# Patient Record
Sex: Male | Born: 1955 | Hispanic: No | Marital: Married | Smoking: Never smoker
Health system: Southern US, Community
[De-identification: ages and names within clinical notes are randomized; demographics above are authoritative.]

## PROBLEM LIST (undated history)

## (undated) DIAGNOSIS — R0683 Snoring: Secondary | ICD-10-CM

## (undated) DIAGNOSIS — S92909A Unspecified fracture of unspecified foot, initial encounter for closed fracture: Secondary | ICD-10-CM

## (undated) DIAGNOSIS — K760 Fatty (change of) liver, not elsewhere classified: Secondary | ICD-10-CM

## (undated) DIAGNOSIS — I10 Essential (primary) hypertension: Secondary | ICD-10-CM

## (undated) DIAGNOSIS — D649 Anemia, unspecified: Secondary | ICD-10-CM

## (undated) DIAGNOSIS — N179 Acute kidney failure, unspecified: Secondary | ICD-10-CM

## (undated) DIAGNOSIS — C9000 Multiple myeloma not having achieved remission: Secondary | ICD-10-CM

## (undated) DIAGNOSIS — E785 Hyperlipidemia, unspecified: Secondary | ICD-10-CM

## (undated) DIAGNOSIS — Z9481 Bone marrow transplant status: Secondary | ICD-10-CM

## (undated) HISTORY — DX: Hyperlipidemia, unspecified: E78.5

## (undated) HISTORY — PX: OTHER SURGICAL HISTORY: SHX169

## (undated) HISTORY — PX: EYE SURGERY: SHX253

## (undated) HISTORY — PX: HERNIA REPAIR: SHX51

## (undated) HISTORY — DX: Essential (primary) hypertension: I10

## (undated) HISTORY — DX: Acute kidney failure, unspecified: N17.9

## (undated) HISTORY — DX: Fatty (change of) liver, not elsewhere classified: K76.0

## (undated) HISTORY — DX: Bone marrow transplant status: Z94.81

## (undated) HISTORY — DX: Unspecified fracture of unspecified foot, initial encounter for closed fracture: S92.909A

## (undated) HISTORY — DX: Multiple myeloma not having achieved remission: C90.00

---

## 1992-10-14 DIAGNOSIS — I10 Essential (primary) hypertension: Secondary | ICD-10-CM

## 1992-10-14 HISTORY — DX: Essential (primary) hypertension: I10

## 2000-04-13 HISTORY — PX: OTHER SURGICAL HISTORY: SHX169

## 2002-04-05 ENCOUNTER — Encounter: Payer: Self-pay | Admitting: Family Medicine

## 2004-03-14 DIAGNOSIS — E785 Hyperlipidemia, unspecified: Secondary | ICD-10-CM

## 2004-03-14 HISTORY — DX: Hyperlipidemia, unspecified: E78.5

## 2004-03-30 ENCOUNTER — Encounter: Payer: Self-pay | Admitting: Family Medicine

## 2004-03-30 LAB — CONVERTED CEMR LAB
Blood Glucose, Fasting: 108 mg/dL
TSH: 1.01 u[IU]/mL

## 2004-09-18 ENCOUNTER — Ambulatory Visit: Payer: Self-pay | Admitting: Family Medicine

## 2005-11-15 ENCOUNTER — Ambulatory Visit: Payer: Self-pay | Admitting: Family Medicine

## 2006-05-07 ENCOUNTER — Ambulatory Visit: Payer: Self-pay | Admitting: Family Medicine

## 2006-05-28 ENCOUNTER — Ambulatory Visit: Payer: Self-pay | Admitting: Family Medicine

## 2006-05-29 ENCOUNTER — Encounter: Payer: Self-pay | Admitting: Family Medicine

## 2006-05-29 LAB — CONVERTED CEMR LAB: TSH: 1.45 microintl units/mL

## 2006-06-20 ENCOUNTER — Ambulatory Visit: Payer: Self-pay | Admitting: Family Medicine

## 2006-06-20 LAB — CONVERTED CEMR LAB: PSA: 1.85 ng/mL

## 2006-06-24 ENCOUNTER — Ambulatory Visit: Payer: Self-pay | Admitting: Family Medicine

## 2006-07-09 ENCOUNTER — Ambulatory Visit: Payer: Self-pay | Admitting: Family Medicine

## 2006-10-24 ENCOUNTER — Ambulatory Visit: Payer: Self-pay | Admitting: Family Medicine

## 2007-02-10 ENCOUNTER — Encounter: Payer: Self-pay | Admitting: Internal Medicine

## 2007-02-12 ENCOUNTER — Ambulatory Visit: Payer: Self-pay | Admitting: Family Medicine

## 2007-02-12 DIAGNOSIS — N529 Male erectile dysfunction, unspecified: Secondary | ICD-10-CM | POA: Insufficient documentation

## 2007-02-12 DIAGNOSIS — F988 Other specified behavioral and emotional disorders with onset usually occurring in childhood and adolescence: Secondary | ICD-10-CM | POA: Insufficient documentation

## 2007-03-05 ENCOUNTER — Ambulatory Visit: Payer: Self-pay | Admitting: Family Medicine

## 2007-03-05 ENCOUNTER — Telehealth: Payer: Self-pay | Admitting: Family Medicine

## 2007-03-05 DIAGNOSIS — F4321 Adjustment disorder with depressed mood: Secondary | ICD-10-CM | POA: Insufficient documentation

## 2007-06-02 ENCOUNTER — Encounter: Payer: Self-pay | Admitting: Family Medicine

## 2007-06-02 DIAGNOSIS — E785 Hyperlipidemia, unspecified: Secondary | ICD-10-CM | POA: Insufficient documentation

## 2007-11-02 ENCOUNTER — Telehealth (INDEPENDENT_AMBULATORY_CARE_PROVIDER_SITE_OTHER): Payer: Self-pay | Admitting: *Deleted

## 2007-12-15 ENCOUNTER — Telehealth: Payer: Self-pay | Admitting: Family Medicine

## 2008-01-20 ENCOUNTER — Ambulatory Visit: Payer: Self-pay | Admitting: Family Medicine

## 2008-01-20 DIAGNOSIS — I10 Essential (primary) hypertension: Secondary | ICD-10-CM | POA: Insufficient documentation

## 2008-05-31 ENCOUNTER — Telehealth: Payer: Self-pay | Admitting: Family Medicine

## 2008-06-13 ENCOUNTER — Ambulatory Visit: Payer: Self-pay | Admitting: Family Medicine

## 2008-06-14 LAB — CONVERTED CEMR LAB
ALT: 30 units/L (ref 0–53)
AST: 24 units/L (ref 0–37)
Albumin: 4.5 g/dL (ref 3.5–5.2)
Alkaline Phosphatase: 56 units/L (ref 39–117)
Calcium: 9.8 mg/dL (ref 8.4–10.5)
Chloride: 103 meq/L (ref 96–112)
LDL Cholesterol: 92 mg/dL (ref 0–99)
PSA: 2.04 ng/mL (ref 0.10–4.00)
Potassium: 4.4 meq/L (ref 3.5–5.3)
Sodium: 138 meq/L (ref 135–145)
TSH: 1.717 microintl units/mL (ref 0.350–4.50)
Total Protein: 7.4 g/dL (ref 6.0–8.3)

## 2008-06-16 ENCOUNTER — Ambulatory Visit: Payer: Self-pay | Admitting: Family Medicine

## 2008-07-06 ENCOUNTER — Telehealth: Payer: Self-pay | Admitting: Family Medicine

## 2008-07-08 ENCOUNTER — Ambulatory Visit: Payer: Self-pay | Admitting: Family Medicine

## 2008-07-08 LAB — CONVERTED CEMR LAB
OCCULT 1: POSITIVE
OCCULT 2: NEGATIVE
OCCULT 3: NEGATIVE

## 2008-07-11 ENCOUNTER — Encounter (INDEPENDENT_AMBULATORY_CARE_PROVIDER_SITE_OTHER): Payer: Self-pay | Admitting: *Deleted

## 2008-11-15 ENCOUNTER — Telehealth (INDEPENDENT_AMBULATORY_CARE_PROVIDER_SITE_OTHER): Payer: Self-pay | Admitting: *Deleted

## 2008-11-15 ENCOUNTER — Encounter: Payer: Self-pay | Admitting: Family Medicine

## 2008-11-30 ENCOUNTER — Telehealth: Payer: Self-pay | Admitting: Family Medicine

## 2009-02-14 ENCOUNTER — Telehealth: Payer: Self-pay | Admitting: Family Medicine

## 2009-09-04 ENCOUNTER — Ambulatory Visit: Payer: Self-pay | Admitting: Family Medicine

## 2009-09-18 ENCOUNTER — Telehealth: Payer: Self-pay | Admitting: Family Medicine

## 2009-09-27 ENCOUNTER — Emergency Department: Payer: Self-pay | Admitting: Emergency Medicine
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
## 2009-12-13 ENCOUNTER — Ambulatory Visit: Payer: Self-pay | Admitting: Family Medicine

## 2009-12-13 LAB — CONVERTED CEMR LAB
ALT: 32 units/L (ref 0–53)
BUN: 19 mg/dL (ref 6–23)
Basophils Relative: 0.2 % (ref 0.0–3.0)
Bilirubin, Direct: 0.1 mg/dL (ref 0.0–0.3)
Calcium: 9.7 mg/dL (ref 8.4–10.5)
Chloride: 103 meq/L (ref 96–112)
Cholesterol: 173 mg/dL (ref 0–200)
Creatinine, Ser: 1.2 mg/dL (ref 0.4–1.5)
Eosinophils Relative: 2.3 % (ref 0.0–5.0)
GFR calc non Af Amer: 67.11 mL/min (ref >60)
HDL: 46.7 mg/dL (ref >39.00)
Hemoglobin: 14.1 g/dL (ref 13.0–17.0)
LDL Cholesterol: 93 mg/dL (ref 0–99)
Lymphocytes Relative: 35.7 % (ref 12.0–46.0)
MCV: 100.5 fL — ABNORMAL HIGH (ref 78.0–100.0)
Microalb, Ur: 0.7 mg/dL (ref 0.0–1.9)
Monocytes Absolute: 0.4 10*3/uL (ref 0.1–1.0)
Neutro Abs: 2.3 10*3/uL (ref 1.4–7.7)
Neutrophils Relative %: 52.3 % (ref 43.0–77.0)
RBC: 4.14 M/uL — ABNORMAL LOW (ref 4.22–5.81)
Total Bilirubin: 0.8 mg/dL (ref 0.3–1.2)
Triglycerides: 169 mg/dL — ABNORMAL HIGH (ref 0.0–149.0)
WBC: 4.3 10*3/uL — ABNORMAL LOW (ref 4.5–10.5)

## 2009-12-18 ENCOUNTER — Ambulatory Visit: Payer: Self-pay | Admitting: Family Medicine

## 2009-12-29 ENCOUNTER — Ambulatory Visit: Payer: Self-pay | Admitting: Family Medicine

## 2009-12-29 LAB — CONVERTED CEMR LAB
OCCULT 1: NEGATIVE
OCCULT 2: NEGATIVE

## 2010-01-01 ENCOUNTER — Encounter (INDEPENDENT_AMBULATORY_CARE_PROVIDER_SITE_OTHER): Payer: Self-pay | Admitting: *Deleted

## 2010-02-19 ENCOUNTER — Telehealth: Payer: Self-pay | Admitting: Family Medicine

## 2010-05-22 ENCOUNTER — Encounter (INDEPENDENT_AMBULATORY_CARE_PROVIDER_SITE_OTHER): Payer: Self-pay | Admitting: *Deleted

## 2010-11-13 NOTE — Letter (Signed)
Summary: Results Follow up Letter  [place] [facility]  [address] [place], [place] [address]   Phone: [phone]  Fax: [phone]    01/01/2010 MRN: [id number]    FRANCIS DOENGES [address] [place], [place]  [address]    Dear Mr. Meroney,  The following are the results of your recent test(s):  Test         Result    Pap Smear:        Normal _____  Not Normal _____ Comments: ______________________________________________________ Cholesterol: LDL(Bad cholesterol):         Your goal is less than:         HDL (Good cholesterol):       Your goal is more than: Comments:  ______________________________________________________ Mammogram:        Normal _____  Not Normal _____ Comments:  ___________________________________________________________________ Hemoccult:        Normal __X___  Not normal _______ Comments:  Yearly follow up is recommended.   _____________________________________________________________________ Other Tests:    We routinely do not discuss normal results over the telephone.  If you desire a copy of the results, or you have any questions about this information we can discuss them at your next office visit.   Sincerely,    Arta Silence, M.D.  RNS:lsf

## 2010-11-13 NOTE — Progress Notes (Signed)
Summary: Rx Viagra  Phone Note Refill Request Call back at [phone] Message from:  [facility]/ #[id number] on Feb 19, 2010 12:17 PM  Refills Requested: Medication #1:  VIAGRA 100 MG TABS take 1 hour pior relations   Last Refilled: 12/29/2009 Received e-scribe refill request   Method Requested: Electronic Initial call taken by: Sydell Axon LPN,  Feb 19, 8118 12:17 PM    Prescriptions: VIAGRA 100 MG TABS (SILDENAFIL CITRATE) take 1 hour pior relations  #12 x 12   Entered and Authorized by:   Shaune Leeks MD   Signed by:   Shaune Leeks MD on 02/19/2010   Method used:   Electronically to        [facility]. [phone]* (retail)       [address]       [place], [place]  [address]       Ph: [phone]       Fax: [phone]   RxID:   [id number]
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
## 2010-11-13 NOTE — Letter (Signed)
Summary: Nadara Eaton letter  [place] [facility]  [address] [place], [place] [address]   Phone: [phone]  Fax: [phone]       05/22/2010 MRN: [id number]  Luis Burke [address] [place], [place]  [address]  Dear Mr. Luis Burke [facility] [place] [place] announce the retirement of Arta Silence, M.D., from full-time practice at the [facility] office effective April 12, 2010 and his plans of returning part-time.  It is important to Dr. Hetty Ely and to our practice that you understand that [facility] has seven physicians in our office for your health care needs.  We will continue to offer the same exceptional care that you have today.    Dr. Hetty Ely has spoken to many of you about his plans for retirement and returning part-time in the fall.   We will continue to work with you through the transition to schedule appointments for you in the office and meet the high standards that Luis Burke is committed to.   Again, it is with great pleasure that we share the news that Dr. Hetty Ely will return to [facility] in October of 2011 with a reduced schedule.    If you have any questions, or would like to request an appointment with one of our physicians, please call us at [phone] and press the option for Scheduling an appointment.  We take pleasure in providing you with excellent patient care and look forward to seeing you at your next office visit.  [facility] Physicians are:  Tillman Abide, M.D. Laurita Quint, M.D. Roxy Manns, M.D. Kerby Nora, M.D. Hannah Beat, M.D. Ruthe Mannan, M.D. We proudly welcomed Raechel Ache, M.D. and Eustaquio Boyden, M.D. to the practice in July/August 2011.  Sincerely,  [facility]

## 2011-01-23 ENCOUNTER — Other Ambulatory Visit: Payer: Self-pay | Admitting: *Deleted

## 2011-01-23 MED ORDER — AMLODIPINE BESYLATE 5 MG PO TABS: 5.0000 mg | ORAL_TABLET | Freq: Every day | ORAL | Status: DC

## 2011-01-24 ENCOUNTER — Encounter: Payer: Self-pay | Admitting: Family Medicine

## 2011-01-29 ENCOUNTER — Ambulatory Visit (INDEPENDENT_AMBULATORY_CARE_PROVIDER_SITE_OTHER): Payer: Self-pay | Admitting: Family Medicine

## 2011-01-29 ENCOUNTER — Encounter: Payer: Self-pay | Admitting: Family Medicine

## 2011-01-29 DIAGNOSIS — I10 Essential (primary) hypertension: Secondary | ICD-10-CM

## 2011-01-29 DIAGNOSIS — N529 Male erectile dysfunction, unspecified: Secondary | ICD-10-CM

## 2011-01-29 DIAGNOSIS — E785 Hyperlipidemia, unspecified: Secondary | ICD-10-CM

## 2011-01-29 MED ORDER — METOPROLOL SUCCINATE ER 100 MG PO TB24: 100.0000 mg | ORAL_TABLET | Freq: Every day | ORAL | Status: DC

## 2011-01-29 MED ORDER — AMLODIPINE BESYLATE 5 MG PO TABS: 5.0000 mg | ORAL_TABLET | Freq: Every day | ORAL | Status: DC

## 2011-01-29 MED ORDER — SILDENAFIL CITRATE 100 MG PO TABS: 100.0000 mg | ORAL_TABLET | ORAL | Status: DC | PRN

## 2011-01-29 MED ORDER — TRIAMTERENE-HCTZ 37.5-25 MG PO TABS: 1.0000 | ORAL_TABLET | ORAL | Status: DC

## 2011-01-29 NOTE — Assessment & Plan Note (Signed)
Stable, cont curr meds. BP: 118/84 mmHg

## 2011-01-29 NOTE — Assessment & Plan Note (Signed)
Uses Viagra occas when active. Still allows effective performance.

## 2011-01-29 NOTE — Progress Notes (Signed)
  Subjective:    Patient ID: Luis Burke, male    DOB: [birth year], 55 y.o.   MRN: [id number]  HPI Pt here for medication refills. It has been over a year since his last appt (3/11). He is unemployed since last June,  Terminated very quickly. 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 He exercises daily and is sending out resume's.  He is again having pain in the area of the old repaired right inguinal hernia area.  Capsule of his recent history: Daughter died 16-Mar-2023, wife left him in 67, fired in 12 and had to sell his house 10/11! He continues to be positive.  He feels well and has no  Complaints.   Review of SystemsNoncontributory except as above.       Objective:   Physical Exam  Constitutional: He appears well-developed and well-nourished. No distress.  HENT:  Head: Normocephalic and atraumatic.  Right Ear: External ear normal.  Left Ear: External ear normal.  Nose: Nose normal.  Mouth/Throat: Oropharynx is clear and moist.  Eyes: Conjunctivae and EOM are normal. Pupils are equal, round, and reactive to light. Right eye exhibits no discharge. Left eye exhibits no discharge.  Neck: Normal range of motion. Neck supple.  Cardiovascular: Normal rate and regular rhythm.   Pulmonary/Chest: Effort normal and breath sounds normal. He has no wheezes.  Lymphadenopathy:    He has no cervical adenopathy.  Skin: He is not diaphoretic.          Assessment & Plan:

## 2011-01-29 NOTE — Assessment & Plan Note (Signed)
Will recheck when he has insurance. Cont curr healthy lifestyle.

## 2011-08-19 ENCOUNTER — Other Ambulatory Visit: Payer: Self-pay | Admitting: *Deleted

## 2011-08-19 MED ORDER — CYCLOBENZAPRINE HCL 10 MG PO TABS: 10.0000 mg | ORAL_TABLET | Freq: Three times a day (TID) | ORAL | Status: DC | PRN

## 2011-08-19 NOTE — Telephone Encounter (Signed)
Pt is asking for a refill on a muscle relaxer for his back spasms.  He says you last gave him this about a year ago but it is not on his new or old med lists.  [facility].

## 2011-08-20 NOTE — Telephone Encounter (Signed)
Advised pt medicine has been called in.

## 2012-02-19 ENCOUNTER — Other Ambulatory Visit: Payer: Self-pay | Admitting: *Deleted

## 2012-02-19 NOTE — Telephone Encounter (Signed)
OK to refill? Has not been seen x 1 year. No upcoming appt.

## 2012-02-20 ENCOUNTER — Other Ambulatory Visit: Payer: Self-pay

## 2012-02-20 MED ORDER — TRIAMTERENE-HCTZ 37.5-25 MG PO TABS: 1.0000 | ORAL_TABLET | ORAL | Status: DC

## 2012-02-20 MED ORDER — AMLODIPINE BESYLATE 5 MG PO TABS: 5.0000 mg | ORAL_TABLET | Freq: Every day | ORAL | Status: DC

## 2012-02-20 MED ORDER — METOPROLOL SUCCINATE ER 100 MG PO TB24: ORAL_TABLET | ORAL | Status: DC

## 2012-02-20 NOTE — Telephone Encounter (Signed)
Pt request refill metoprolol succinate 100 mg 24 hr tab to be sent to [facility]. Instructions on med list are 1 tab by mouth daily. Take 1/2 tab at bedtime. Pt said he takes 1/2 tab at bedtime. Pt last seen 01/29/11.Please advise. Pt can be reached [phone].Pt going out of town today and would like refill by lunch if possible.

## 2012-02-20 NOTE — Telephone Encounter (Signed)
Fill for 90 days. Schedule OV.  Thanks.

## 2012-02-20 NOTE — Telephone Encounter (Signed)
Patient advised.  Will call in for appt at a later time.

## 2012-02-20 NOTE — Telephone Encounter (Signed)
Patient states he will schedule 30 min OV after he returns from his trip.

## 2012-02-20 NOTE — Telephone Encounter (Signed)
Needs CPE with labs ahead of time.  rx sent for 90 day supply.

## 2012-04-27 ENCOUNTER — Ambulatory Visit (INDEPENDENT_AMBULATORY_CARE_PROVIDER_SITE_OTHER): Payer: 59 | Admitting: Family Medicine

## 2012-04-27 ENCOUNTER — Encounter: Payer: Self-pay | Admitting: Family Medicine

## 2012-04-27 VITALS — BP 142/92 | HR 94 | Temp 98.1°F | Wt 195.5 lb

## 2012-04-27 DIAGNOSIS — Z0000 Encounter for general adult medical examination without abnormal findings: Secondary | ICD-10-CM

## 2012-04-27 DIAGNOSIS — Z125 Encounter for screening for malignant neoplasm of prostate: Secondary | ICD-10-CM

## 2012-04-27 DIAGNOSIS — N529 Male erectile dysfunction, unspecified: Secondary | ICD-10-CM

## 2012-04-27 DIAGNOSIS — Z1211 Encounter for screening for malignant neoplasm of colon: Secondary | ICD-10-CM

## 2012-04-27 DIAGNOSIS — M62830 Muscle spasm of back: Secondary | ICD-10-CM

## 2012-04-27 DIAGNOSIS — I10 Essential (primary) hypertension: Secondary | ICD-10-CM

## 2012-04-27 LAB — COMPREHENSIVE METABOLIC PANEL
ALT: 43 U/L (ref 0–53)
AST: 32 U/L (ref 0–37)
Albumin: 4.6 g/dL (ref 3.5–5.2)
CO2: 28 mEq/L (ref 19–32)
Calcium: 9.7 mg/dL (ref 8.4–10.5)
Chloride: 101 mEq/L (ref 96–112)
Potassium: 3.8 mEq/L (ref 3.5–5.1)

## 2012-04-27 LAB — LIPID PANEL
Cholesterol: 180 mg/dL (ref 0–200)
LDL Cholesterol: 119 mg/dL — ABNORMAL HIGH (ref 0–99)
Total CHOL/HDL Ratio: 4

## 2012-04-27 MED ORDER — TRIAMTERENE-HCTZ 37.5-25 MG PO TABS: 1.0000 | ORAL_TABLET | ORAL | Status: DC

## 2012-04-27 MED ORDER — AMLODIPINE BESYLATE 5 MG PO TABS: 5.0000 mg | ORAL_TABLET | Freq: Every day | ORAL | Status: DC

## 2012-04-27 MED ORDER — METOPROLOL SUCCINATE ER 50 MG PO TB24: 50.0000 mg | ORAL_TABLET | Freq: Every day | ORAL | Status: DC

## 2012-04-27 MED ORDER — CYCLOBENZAPRINE HCL 10 MG PO TABS: 10.0000 mg | ORAL_TABLET | Freq: Three times a day (TID) | ORAL | Status: AC | PRN

## 2012-04-27 NOTE — Progress Notes (Signed)
CPE- See plan.  Routine anticipatory guidance given to patient.  See health maintenance. Tetanus 2009 Flu shot encouraged.  Can get it at work.   D/w patient [id number] for colon cancer screening, including IFOB vs. colonoscopy.  Risks and benefits of both were discussed and patient voiced understanding.  Pt elects for: IFOB.   Due for labs.   Prostate cancer screening and PSA options (with potential risks and benefits of testing vs not testing) were discussed along with recent recs/guidelines.  He agreed with testing PSA at this point.   Hypertension:    Using medication without problems or lightheadedness: yes Chest pain with exertion:no Edema:no Short of breath:no Average home BPs: usually 140/80 Other issues: exercising, cardio  ED: some effect with viagra.  No ADE.    Flexeril taking rarely for back spasms.  No ADE and good effect.  Still exercising frequently.    Social update.  Dating after divorce, death of daughter, job lay off.  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  In stable relationship and he continues to stay positive.   PMH and SH reviewed  Meds, vitals, and allergies reviewed.   ROS: See HPI.  Otherwise negative.    GEN: nad, alert and oriented HEENT: mucous membranes moist NECK: supple w/o LA CV: rrr. PULM: ctab, no inc wob ABD: soft, +bs EXT: no edema SKIN: no acute rash Prostate gland firm and smooth, no enlargement, nodularity, tenderness, mass, asymmetry or induration.

## 2012-04-27 NOTE — Patient Instructions (Addendum)
I would get a flu shot each fall.   We'll contact you with your lab report. Take care.  Glad to see you.  Recheck next year.

## 2012-04-29 ENCOUNTER — Encounter: Payer: Self-pay | Admitting: Family Medicine

## 2012-04-29 DIAGNOSIS — Z0000 Encounter for general adult medical examination without abnormal findings: Secondary | ICD-10-CM | POA: Insufficient documentation

## 2012-04-29 DIAGNOSIS — M62830 Muscle spasm of back: Secondary | ICD-10-CM | POA: Insufficient documentation

## 2012-04-29 NOTE — Assessment & Plan Note (Signed)
Continue PRN flexeril use.  F/u prn.  Continue exercise.

## 2012-04-29 NOTE — Assessment & Plan Note (Signed)
Near goal, continue meds and exercise.  See notes on labs.

## 2012-04-29 NOTE — Assessment & Plan Note (Signed)
Continue current meds.  Some effect.  He's in a supportive relationship.

## 2012-04-30 ENCOUNTER — Encounter: Payer: Self-pay | Admitting: *Deleted

## 2012-05-12 ENCOUNTER — Other Ambulatory Visit: Payer: 59

## 2012-05-12 ENCOUNTER — Encounter: Payer: Self-pay | Admitting: *Deleted

## 2012-05-12 DIAGNOSIS — Z1211 Encounter for screening for malignant neoplasm of colon: Secondary | ICD-10-CM

## 2013-05-05 ENCOUNTER — Other Ambulatory Visit: Payer: Self-pay | Admitting: Family Medicine

## 2013-06-04 ENCOUNTER — Other Ambulatory Visit: Payer: Self-pay | Admitting: Family Medicine

## 2013-06-04 NOTE — Telephone Encounter (Signed)
Electronic refill request. Looks like patient was advised at last RF to make appt.  Please advise.

## 2013-06-04 NOTE — Telephone Encounter (Signed)
Schedule a visit for BP check. Fill the rxs to that point. Thanks.  

## 2013-06-07 NOTE — Telephone Encounter (Signed)
Schedule a visit for BP check. Fill the rxs to that point. Thanks.

## 2013-06-07 NOTE — Telephone Encounter (Signed)
Electronic refill request. Last CPE 04/25/12.  No upcoming appt scheduled.  Please advise.

## 2013-06-08 ENCOUNTER — Other Ambulatory Visit: Payer: Self-pay | Admitting: Family Medicine

## 2013-06-21 ENCOUNTER — Ambulatory Visit (INDEPENDENT_AMBULATORY_CARE_PROVIDER_SITE_OTHER): Payer: 59 | Admitting: Family Medicine

## 2013-06-21 ENCOUNTER — Encounter: Payer: Self-pay | Admitting: Family Medicine

## 2013-06-21 VITALS — BP 130/92 | HR 68 | Temp 98.1°F | Ht 68.0 in | Wt 197.2 lb

## 2013-06-21 DIAGNOSIS — Z1211 Encounter for screening for malignant neoplasm of colon: Secondary | ICD-10-CM | POA: Insufficient documentation

## 2013-06-21 DIAGNOSIS — Z125 Encounter for screening for malignant neoplasm of prostate: Secondary | ICD-10-CM

## 2013-06-21 DIAGNOSIS — Z0000 Encounter for general adult medical examination without abnormal findings: Secondary | ICD-10-CM

## 2013-06-21 DIAGNOSIS — I10 Essential (primary) hypertension: Secondary | ICD-10-CM

## 2013-06-21 DIAGNOSIS — L989 Disorder of the skin and subcutaneous tissue, unspecified: Secondary | ICD-10-CM | POA: Insufficient documentation

## 2013-06-21 LAB — COMPREHENSIVE METABOLIC PANEL
AST: 27 U/L (ref 0–37)
Alkaline Phosphatase: 44 U/L (ref 39–117)
BUN: 24 mg/dL — ABNORMAL HIGH (ref 6–23)
Creatinine, Ser: 1.2 mg/dL (ref 0.4–1.5)
Total Bilirubin: 1 mg/dL (ref 0.3–1.2)

## 2013-06-21 LAB — LIPID PANEL
Cholesterol: 174 mg/dL (ref 0–200)
HDL: 50.2 mg/dL (ref >39.00)
LDL Cholesterol: 104 mg/dL — ABNORMAL HIGH (ref 0–99)
Total CHOL/HDL Ratio: 3
Triglycerides: 100 mg/dL (ref 0.0–149.0)

## 2013-06-21 MED ORDER — METOPROLOL SUCCINATE ER 50 MG PO TB24: ORAL_TABLET | ORAL | Status: DC

## 2013-06-21 MED ORDER — AMLODIPINE BESYLATE 5 MG PO TABS: 5.0000 mg | ORAL_TABLET | Freq: Every day | ORAL | Status: DC

## 2013-06-21 MED ORDER — TRIAMTERENE-HCTZ 37.5-25 MG PO TABS: ORAL_TABLET | ORAL | Status: DC

## 2013-06-21 NOTE — Patient Instructions (Addendum)
If you need help setting up the referral for the hernia, then let us know.   Go to the lab on the way out.  We'll contact you with your lab report. Keep exercising.  Take care.

## 2013-06-21 NOTE — Assessment & Plan Note (Signed)
Prostate cancer screening and PSA options (with potential risks and benefits of testing vs not testing) were discussed along with recent recs/guidelines.  He agreed with testing PSA at this point.

## 2013-06-21 NOTE — Progress Notes (Signed)
Hypertension:  Using medication without problems or lightheadedness: yes Chest pain with exertion: yes Edema:no Short of breath:no Average home BPs: 130/82 on home checks Working on diet and frequent exercise.  Due for labs.    Caring for parents, mother with dementia.   7mm irritated lesion on L forearm.  Noted recently by patient.    RIH noted. Slightly enlarged per patient.  Not painful.    D/w patient [id number] for colon cancer screening, including IFOB vs. colonoscopy.  Risks and benefits of both were discussed and patient voiced understanding.  Pt elects for: IFOB  Prostate cancer screening and PSA options (with potential risks and benefits of testing vs not testing) were discussed along with recent recs/guidelines.  He elected for testing PSA at this point.  He gets a flu shot at work.   Meds, vitals, and allergies reviewed.   PMH and SH reviewed  ROS: See HPI.  Otherwise negative.    GEN: nad, alert and oriented HEENT: mucous membranes moist NECK: supple w/o LA CV: rrr. PULM: ctab, no inc wob ABD: soft, +bs EXT: no edema SKIN: no acute rash but 7mm irritated SK on L forearm.  Treated today x3 with liq N2.  Tolerated well and no complications.

## 2013-06-21 NOTE — Assessment & Plan Note (Signed)
D/w patient re:options for colon cancer screening, including IFOB vs. colonoscopy.  Risks and benefits of both were discussed and patient voiced understanding.  Pt elects for:IFOB  

## 2013-06-21 NOTE — Assessment & Plan Note (Signed)
7mm irritated SK on L forearm.  Treated today x3 with liq N2.  Tolerated well and no complications.   Routine post freeze instructions given to patient.  F/u if not resolved with smooth skin.  He agrees.

## 2013-06-21 NOTE — Assessment & Plan Note (Signed)
Controlled, continue current meds.  See notes on labs.  Continue diet and exercise.

## 2013-08-02 ENCOUNTER — Other Ambulatory Visit (INDEPENDENT_AMBULATORY_CARE_PROVIDER_SITE_OTHER): Payer: 59

## 2013-08-02 DIAGNOSIS — Z1211 Encounter for screening for malignant neoplasm of colon: Secondary | ICD-10-CM

## 2013-08-03 ENCOUNTER — Encounter: Payer: Self-pay | Admitting: *Deleted

## 2013-08-03 LAB — FECAL OCCULT BLOOD, IMMUNOCHEMICAL: Fecal Occult Bld: NEGATIVE

## 2013-10-18 ENCOUNTER — Ambulatory Visit: Payer: 59 | Admitting: Internal Medicine

## 2013-10-18 ENCOUNTER — Encounter: Payer: Self-pay | Admitting: Family Medicine

## 2013-10-18 ENCOUNTER — Ambulatory Visit (INDEPENDENT_AMBULATORY_CARE_PROVIDER_SITE_OTHER): Payer: 59 | Admitting: Family Medicine

## 2013-10-18 VITALS — BP 138/90 | HR 73 | Temp 98.1°F | Wt 193.5 lb

## 2013-10-18 DIAGNOSIS — K4090 Unilateral inguinal hernia, without obstruction or gangrene, not specified as recurrent: Secondary | ICD-10-CM | POA: Insufficient documentation

## 2013-10-18 NOTE — Patient Instructions (Signed)
Rosaria Ferries will call about your referral. Take care.

## 2013-10-18 NOTE — Assessment & Plan Note (Signed)
Anatomy d/w pt.  Soft, not incarcerated.  Reasonable to refer.  Not urgent.  F/u prn.

## 2013-10-18 NOTE — Progress Notes (Signed)
Pre-visit discussion using our clinic review tool. No additional management support is needed unless otherwise documented below in the visit note.  H/o LIH prev. Now with similar on the R side.  Felt a bulge.  Can push it back it.  Noted in the gym, with lifting.  Annoying but not painful.  Present years, getting bigger.  No sx of incarceration.    Meds, vitals, and allergies reviewed.   ROS: See HPI.  Otherwise, noncontributory.  nad ncat Soft abd with reducible RIH

## 2013-10-21 ENCOUNTER — Encounter (INDEPENDENT_AMBULATORY_CARE_PROVIDER_SITE_OTHER): Payer: Self-pay | Admitting: General Surgery

## 2013-10-21 ENCOUNTER — Ambulatory Visit (INDEPENDENT_AMBULATORY_CARE_PROVIDER_SITE_OTHER): Payer: 59 | Admitting: General Surgery

## 2013-10-21 VITALS — BP 126/74 | HR 80 | Resp 16 | Ht 70.0 in | Wt 193.0 lb

## 2013-10-21 DIAGNOSIS — K4090 Unilateral inguinal hernia, without obstruction or gangrene, not specified as recurrent: Secondary | ICD-10-CM

## 2013-10-21 NOTE — Progress Notes (Signed)
Patient ID: Luis Burke, male   DOB: 1956-06-15, 58 y.o.   MRN: [id number]  Chief Complaint  Patient presents with  . Other    Eval Ing hernia    HPI Luis Burke is a 58 y.o. male.   HPI 58 year old Caucasian male referred by Dr. Bufford Spikes for evaluation of a right inguinal hernia. The patient states that he has had a hernia in his right groin for about 3 years. It initially did not bother him however several months ago it started bothering him while in the gym. It just causes him discomfort when he is exercising at the gym. It has never been hard or real swollen or tender. He denies any fever, chills, nausea, vomiting, diarrhea or constipation. He denies any nocturia. He has had a prior left inguinal hernia repair. He thinks that it might have been done laparoscopic. Past Medical History  Diagnosis Date  . Hypertension 1994  . Hyperlipidemia 03/2004    Past Surgical History  Procedure Laterality Date  . Left wrist ganglionectomy    . L lap herniorraphy  04/2000    Family History  Problem Relation Age of Onset  . Hypertension Mother   . Diabetes Mother   . Heart disease Mother     CAD  . Dementia Mother   . Hypertension Father   . Heart disease Father     MI 02/03  . Hypertension Sister   . Hypertension Sister   . Hypertension Sister   . Colon cancer Neg Hx   . Prostate cancer Neg Hx     Social History History  Substance Use Topics  . Smoking status: Never Smoker   . Smokeless tobacco: Not on file     Comment: Occassionally  . Alcohol Use: 0.0 oz/week    2-3 drink(s) per week     Comment: beers weekly    Allergies  Allergen Reactions  . Tadalafil     REACTION: made him feel funny    Current Outpatient Prescriptions  Medication Sig Dispense Refill  . amLODipine (NORVASC) 5 MG tablet Take 1 tablet (5 mg total) by mouth daily.  30 tablet  12  . Ascorbic Acid (VITAMIN C) 500 MG tablet Take 500 mg by mouth daily.        . cyclobenzaprine (FLEXERIL) 10  MG tablet Take 10 mg by mouth 3 (three) times daily as needed for muscle spasms.      . metoprolol succinate (TOPROL-XL) 50 MG 24 hr tablet TAKE 1 TABLET BY MOUTH DAILY  30 tablet  12  . Multiple Vitamin (MULTIVITAMIN) tablet Take 1 tablet by mouth daily.        . Omega-3 Fatty Acids (FISH OIL) 1000 MG CAPS Take by mouth daily.        . sildenafil (VIAGRA) 100 MG tablet Take 1 tablet (100 mg total) by mouth as needed for erectile dysfunction. Take one by mouth one hour prior to relations  10 tablet  6  . triamterene-hydrochlorothiazide (MAXZIDE-25) 37.5-25 MG per tablet TAKE ONE TABLET BY MOUTH EVERY MORNING  30 tablet  12   No current facility-administered medications for this visit.    Review of Systems Review of Systems  Constitutional: Negative for fever, chills, appetite change and unexpected weight change.  HENT: Negative for congestion and trouble swallowing.   Eyes: Negative for visual disturbance.  Respiratory: Negative for chest tightness and shortness of breath.   Cardiovascular: Negative for chest pain and leg swelling.  No PND, no orthopnea, no DOE  Gastrointestinal:       See HPI  Genitourinary: Negative for dysuria and hematuria.  Musculoskeletal: Negative.   Skin: Negative for rash.  Neurological: Negative for seizures and speech difficulty.  Hematological: Does not bruise/bleed easily.  Psychiatric/Behavioral: Negative for behavioral problems and confusion.    Blood pressure 126/74, pulse 80, resp. rate 16, height 5\' 10"  (1.778 m), weight 193 lb (87.544 kg).  Physical Exam Physical Exam  Constitutional: He is oriented to person, place, and time. He appears well-developed and well-nourished. No distress.  HENT:  Head: Normocephalic and atraumatic.  Right Ear: External ear normal.  Left Ear: External ear normal.  Eyes: Conjunctivae are normal. No scleral icterus.  Neck: Normal range of motion. Neck supple. No tracheal deviation present. No thyromegaly  present.  Cardiovascular: Normal rate, normal heart sounds and intact distal pulses.   Pulmonary/Chest: Effort normal and breath sounds normal. No respiratory distress. He has no wheezes.  Abdominal: Soft. He exhibits no distension. There is no tenderness. There is no rebound and no guarding. A hernia is present. Hernia confirmed positive in the right inguinal area. Hernia confirmed negative in the left inguinal area.  Genitourinary: Testes normal and penis normal.  Patient examined supine and standing. Reducible nontender right inguinal hernia. Did not really appreciate any surgical scars in left groin  Musculoskeletal: Normal range of motion. He exhibits no edema and no tenderness.  Lymphadenopathy:    He has no cervical adenopathy.  Neurological: He is alert and oriented to person, place, and time. He exhibits normal muscle tone.  Skin: Skin is warm and dry. No rash noted. He is not diaphoretic. No erythema. No pallor.  Psychiatric: He has a normal mood and affect. His behavior is normal. Judgment and thought content normal.    Data Reviewed Dr Josefine Class note  Assessment    Right inguinal hernia     Plan    We discussed the etiology of inguinal hernias. We discussed the signs & symptoms of incarceration & strangulation.  We discussed non-operative and operative management.We discussed both open as well as laparoscopic repairs. We discussed the pros and cons of each and how they differ  The patient has elected To proceed with a laparoscopic repair of a right inguinal hernia with mesh   I described the procedure in detail.  The patient was given educational material. We discussed the risks and benefits including but not limited to bleeding, infection, chronic inguinal pain, nerve entrapment, hernia recurrence, mesh complications, hematoma formation, urinary retention, injury to the testicles, numbness in the groin, blood clots, injury to the surrounding structures, and anesthesia risk. We  also discussed the typical post operative recovery course, including no heavy lifting for 4-6 weeks. I explained that the likelihood of improvement of their symptoms is Good  He needs to check his work schedule so he was instructed to call back to our Office to schedule surgery  [name]. Redmond Pulling, MD, FACS General, Bariatric, & Minimally Invasive Surgery [facility] Surgery, [place]          [facility] M 10/21/2013, 3:30 PM

## 2013-10-21 NOTE — Patient Instructions (Signed)
Call our office to schedule your surgery  Hernia Repair with Laparoscope A hernia occurs when an internal organ pushes out through a weak spot in the belly (abdominal) wall muscles. Hernias most commonly occur in the groin and around the navel. Hernias can also occur through a cut by the surgeon (incision) after an abdominal operation. A hernia may be caused by:  Lifting heavy objects.  Prolonged coughing.  Straining to move your bowels. Hernias can often be pushed back into place (reduced). Most hernias tend to get worse over time. Problems occur when abdominal contents get stuck in the opening and the blood supply is blocked or impaired (incarcerated hernia). Because of these risks, you require surgery to repair the hernia. Your hernia will be repaired using a laparoscope. Laparoscopic surgery is a type of minimally invasive surgery. It does not involve making a typical surgical cut (incision) in the skin. A laparoscope is a telescope-like rod and lens system. It is usually connected to a video camera and a light source so your caregiver can clearly see the operative area. The instruments are inserted through  to  inch (5 mm or 10 mm) openings in the skin at specific locations. A working and viewing space is created by blowing a small amount of carbon dioxide gas into the abdominal cavity. The abdomen is essentially blown up like a balloon (insufflated). This elevates the abdominal wall above the internal organs like a dome. The carbon dioxide gas is common to the human body and can be absorbed by tissue and removed by the respiratory system. Once the repair is completed, the small incisions will be closed with either stitches (sutures) or staples (just like a paper stapler only this staple holds the skin together). LET YOUR CAREGIVERS KNOW ABOUT:  Allergies.  Medications taken including herbs, eye drops, over the counter medications, and creams.  Use of steroids (by mouth or  creams).  Previous problems with anesthetics or Novocaine.  Possibility of pregnancy, if this applies.  History of blood clots (thrombophlebitis).  History of bleeding or blood problems.  Previous surgery.  Other health problems. BEFORE THE PROCEDURE  Laparoscopy can be done either in a hospital or out-patient clinic. You may be given a mild sedative to help you relax before the procedure. Once in the operating room, you will be given a general anesthesia to make you sleep (unless you and your caregiver choose a different anesthetic).  AFTER THE PROCEDURE  After the procedure you will be watched in a recovery area. Depending on what type of hernia was repaired, you might be admitted to the hospital or you might go home the same day. With this procedure you may have less pain and scarring. This usually results in a quicker recovery and less risk of infection. HOME CARE INSTRUCTIONS   Bed rest is not required. You may continue your normal activities but avoid heavy lifting (more than 10 pounds) or straining.  Cough gently. If you are a smoker it is best to stop, as even the best hernia repair can break down with the continual strain of coughing.  Avoid driving until given the OK by your surgeon.  There are no dietary restrictions unless given otherwise.  TAKE ALL MEDICATIONS AS DIRECTED.  Only take over-the-counter or prescription medicines for pain, discomfort, or fever as directed by your caregiver. SEEK MEDICAL CARE IF:   There is increasing abdominal pain or pain in your incisions.  There is more bleeding from incisions, other than minimal spotting.  You feel light headed or faint.  You develop an unexplained fever, chills, and/or an oral temperature above 102 F (38.9 C).  You have redness, swelling, or increasing pain in the wound.  Pus coming from wound.  A foul smell coming from the wound or dressings. SEEK IMMEDIATE MEDICAL CARE IF:   You develop a rash.  You  have difficulty breathing.  You have any allergic problems. MAKE SURE YOU:   Understand these instructions.  Will watch your condition.  Will get help right away if you are not doing well or get worse. Document Released: 09/30/2005 Document Revised: 12/23/2011 Document Reviewed: 08/30/2009 [facility] Patient Information 2014 [place], [place].

## 2013-11-08 DIAGNOSIS — K4090 Unilateral inguinal hernia, without obstruction or gangrene, not specified as recurrent: Secondary | ICD-10-CM

## 2013-11-09 ENCOUNTER — Other Ambulatory Visit (INDEPENDENT_AMBULATORY_CARE_PROVIDER_SITE_OTHER): Payer: Self-pay | Admitting: *Deleted

## 2013-11-09 MED ORDER — OXYCODONE-ACETAMINOPHEN 5-325 MG PO TABS: 2.0000 | ORAL_TABLET | ORAL | Status: DC | PRN

## 2013-11-10 ENCOUNTER — Encounter: Payer: Self-pay | Admitting: Family Medicine

## 2013-11-10 ENCOUNTER — Ambulatory Visit (INDEPENDENT_AMBULATORY_CARE_PROVIDER_SITE_OTHER): Payer: 59 | Admitting: Family Medicine

## 2013-11-10 VITALS — BP 130/96 | HR 64 | Temp 98.5°F | Wt 185.8 lb

## 2013-11-10 DIAGNOSIS — R6889 Other general symptoms and signs: Secondary | ICD-10-CM

## 2013-11-10 DIAGNOSIS — R899 Unspecified abnormal finding in specimens from other organs, systems and tissues: Secondary | ICD-10-CM

## 2013-11-10 LAB — CBC WITH DIFFERENTIAL/PLATELET
BASOS ABS: 0 10*3/uL (ref 0.0–0.1)
BASOS PCT: 0.4 % (ref 0.0–3.0)
EOS ABS: 0.1 10*3/uL (ref 0.0–0.7)
Eosinophils Relative: 0.8 % (ref 0.0–5.0)
HCT: 43.7 % (ref 39.0–52.0)
HEMOGLOBIN: 14.4 g/dL (ref 13.0–17.0)
LYMPHS PCT: 25.4 % (ref 12.0–46.0)
Lymphs Abs: 2.1 10*3/uL (ref 0.7–4.0)
MCHC: 33 g/dL (ref 30.0–36.0)
MCV: 100.6 fl — AB (ref 78.0–100.0)
MONO ABS: 0.5 10*3/uL (ref 0.1–1.0)
Monocytes Relative: 6.6 % (ref 3.0–12.0)
NEUTROS ABS: 5.5 10*3/uL (ref 1.4–7.7)
Neutrophils Relative %: 66.8 % (ref 43.0–77.0)
Platelets: 145 10*3/uL — ABNORMAL LOW (ref 150.0–400.0)
RBC: 4.35 Mil/uL (ref 4.22–5.81)
RDW: 13.2 % (ref 11.5–14.6)
WBC: 8.2 10*3/uL (ref 4.5–10.5)

## 2013-11-10 NOTE — Assessment & Plan Note (Signed)
Recheck his CBC today.  Will request his pre op labs.  Incisions appear to be healing.  Okay for outpatient f/u.  dw pt.

## 2013-11-10 NOTE — Patient Instructions (Signed)
Go to the lab on the way out.  We'll contact you with your lab report. We'll go from there.  

## 2013-11-10 NOTE — Progress Notes (Signed)
Pre-visit discussion using our clinic review tool. No additional management support is needed unless otherwise documented below in the visit note.  Still had surgery but was told "my panel" was low.  No abnormal bleeding, no fevers.  Pain is reasonable, controlled with meds prn, used rarely. BM today x2.  Eating.  Sore at the surgery site. Was still able to have the surgery done.   Meds, vitals, and allergies reviewed.   ROS: See HPI.  Otherwise, noncontributory.  nad ncat rrr ctab abd soft, sites healing w/o signs of infection abd not ttp except as expected at incision sites.  Ext w/o edema

## 2013-11-11 ENCOUNTER — Encounter: Payer: Self-pay | Admitting: *Deleted

## 2013-11-24 ENCOUNTER — Encounter (INDEPENDENT_AMBULATORY_CARE_PROVIDER_SITE_OTHER): Payer: Self-pay | Admitting: General Surgery

## 2013-11-24 ENCOUNTER — Ambulatory Visit (INDEPENDENT_AMBULATORY_CARE_PROVIDER_SITE_OTHER): Payer: 59 | Admitting: General Surgery

## 2013-11-24 VITALS — BP 138/100 | HR 68 | Temp 97.7°F | Resp 14 | Ht 70.0 in | Wt 192.0 lb

## 2013-11-24 DIAGNOSIS — Z09 Encounter for follow-up examination after completed treatment for conditions other than malignant neoplasm: Secondary | ICD-10-CM

## 2013-11-24 NOTE — Patient Instructions (Signed)
Can do light cardio now - low incline, low resistance, low level stationary bike, elliptical, treadmill Can resume full activities on or after 12/06/13 - start light and work back up

## 2013-11-24 NOTE — Progress Notes (Signed)
Subjective:     Patient ID: Luis Burke, male   DOB: June 14, 1956, 58 y.o.   MRN: [id number]  HPI 58 year old Caucasian male comes in today for his first followup appointment after undergoing a laparoscopic repair of a right direct inguinal hernia with mesh on January 26 at the [facility]. He states that he is doing well. He is no longer taking any pain medication. He denies any shooting, stabbing or burning pain in his groin. He states that he may still have a little bit of swelling in his groin. He denies any difficulty urinating. He denies any current fevers or chills. He denies any problems with his appetite  Review of Systems     Objective:   Physical Exam BP 138/100  Pulse 68  Temp(Src) 97.7 F (36.5 C) (Oral)  Resp 14  Ht 5\' 10"  (1.778 m)  Wt 192 lb (87.091 kg)  BMI 27.55 kg/m2 Alert, no apparent distress Abdomen-soft, nontender, well-healed trocar incisions. No cellulitis. No umbilical hernia GU-both testicles down. No cellulitis or ecchymosis in the right groin. Small amount of swelling in the right groin. No evidence of inguinal hernia recurrence    Assessment:     Status post laparoscopic repair of right direct inguinal hernia with mesh.     Plan:     I told him he could you light cardiovascular activities now. However I reminded him he should not do any heavy lifting, pushing, or Burke until after February 23. I explained that the swelling should continue to go down with time. All of his questions were asked and answered. Followup as needed  Luis Burke. Luis Pulling, MD, FACS General, Bariatric, & Minimally Invasive Surgery [facility], [place]

## 2013-12-02 ENCOUNTER — Encounter (INDEPENDENT_AMBULATORY_CARE_PROVIDER_SITE_OTHER): Payer: Self-pay

## 2014-01-31 ENCOUNTER — Encounter: Payer: Self-pay | Admitting: Family Medicine

## 2014-01-31 ENCOUNTER — Ambulatory Visit (INDEPENDENT_AMBULATORY_CARE_PROVIDER_SITE_OTHER): Payer: 59 | Admitting: Family Medicine

## 2014-01-31 VITALS — BP 152/92 | HR 85 | Temp 98.0°F | Wt 200.5 lb

## 2014-01-31 DIAGNOSIS — N4889 Other specified disorders of penis: Secondary | ICD-10-CM | POA: Insufficient documentation

## 2014-01-31 DIAGNOSIS — R369 Urethral discharge, unspecified: Secondary | ICD-10-CM

## 2014-01-31 NOTE — Assessment & Plan Note (Signed)
Could be from IUD strings. Check labs today and notify pt.  He agrees. Monogamous.

## 2014-01-31 NOTE — Progress Notes (Signed)
Pre visit review using our clinic review tool, if applicable. No additional management support is needed unless otherwise documented below in the visit note.  Sexually active with girlfriend, monogamous, she has an IUD.  Mild discomfort with urination.  Some discharge.  No testicle pain. No rash.  No FCNAVD.    Meds, vitals, and allergies reviewed.   ROS: See HPI.  Otherwise, noncontributory.  nad Testes bilaterally descended without nodularity, tenderness or masses. No scrotal masses or lesions. No penis lesions or urethral discharge except for irritation at the tip of penis.

## 2014-01-31 NOTE — Patient Instructions (Signed)
Go to the lab on the way out.  We'll contact you with your lab report. We'll go from there.   Take care.  

## 2014-02-01 LAB — GC/CHLAMYDIA PROBE AMP, URINE
Chlamydia, Swab/Urine, PCR: NEGATIVE
GC Probe Amp, Urine: NEGATIVE

## 2014-02-01 LAB — HIV ANTIBODY (ROUTINE TESTING W REFLEX): HIV: NONREACTIVE

## 2014-02-01 LAB — RPR

## 2014-07-04 ENCOUNTER — Other Ambulatory Visit: Payer: Self-pay | Admitting: Family Medicine

## 2014-08-04 ENCOUNTER — Other Ambulatory Visit: Payer: Self-pay | Admitting: Family Medicine

## 2014-08-04 NOTE — Telephone Encounter (Signed)
Morey Hummingbird said pt scheduled CPX and is at [facility] now to pickup refills; refill x 1 to [place] notified pt refill done.

## 2014-08-11 ENCOUNTER — Encounter: Payer: 59 | Admitting: Family Medicine

## 2014-08-30 ENCOUNTER — Encounter: Payer: 59 | Admitting: Family Medicine

## 2014-09-01 ENCOUNTER — Other Ambulatory Visit: Payer: Self-pay | Admitting: Family Medicine

## 2014-09-16 ENCOUNTER — Encounter: Payer: 59 | Admitting: Family Medicine

## 2014-09-29 ENCOUNTER — Encounter: Payer: Self-pay | Admitting: Family Medicine

## 2014-09-29 ENCOUNTER — Ambulatory Visit (INDEPENDENT_AMBULATORY_CARE_PROVIDER_SITE_OTHER): Payer: BC Managed Care – PPO | Admitting: Family Medicine

## 2014-09-29 VITALS — BP 142/92 | HR 69 | Temp 98.1°F | Ht 70.0 in | Wt 206.5 lb

## 2014-09-29 DIAGNOSIS — Z1211 Encounter for screening for malignant neoplasm of colon: Secondary | ICD-10-CM

## 2014-09-29 DIAGNOSIS — B356 Tinea cruris: Secondary | ICD-10-CM

## 2014-09-29 DIAGNOSIS — Z23 Encounter for immunization: Secondary | ICD-10-CM

## 2014-09-29 DIAGNOSIS — I10 Essential (primary) hypertension: Secondary | ICD-10-CM

## 2014-09-29 DIAGNOSIS — Z0000 Encounter for general adult medical examination without abnormal findings: Secondary | ICD-10-CM

## 2014-09-29 LAB — LIPID PANEL
CHOLESTEROL: 188 mg/dL (ref 0–200)
HDL: 40.1 mg/dL (ref >39.00)
LDL Cholesterol: 126 mg/dL — ABNORMAL HIGH (ref 0–99)
NonHDL: 147.9
TRIGLYCERIDES: 108 mg/dL (ref 0.0–149.0)
Total CHOL/HDL Ratio: 5
VLDL: 21.6 mg/dL (ref 0.0–40.0)

## 2014-09-29 LAB — COMPREHENSIVE METABOLIC PANEL
ALBUMIN: 4.6 g/dL (ref 3.5–5.2)
ALT: 41 U/L (ref 0–53)
AST: 28 U/L (ref 0–37)
Alkaline Phosphatase: 54 U/L (ref 39–117)
BUN: 20 mg/dL (ref 6–23)
CO2: 26 meq/L (ref 19–32)
Calcium: 9.7 mg/dL (ref 8.4–10.5)
Chloride: 106 mEq/L (ref 96–112)
Creatinine, Ser: 1.1 mg/dL (ref 0.4–1.5)
GFR: 75.28 mL/min (ref >60.00)
Glucose, Bld: 107 mg/dL — ABNORMAL HIGH (ref 70–99)
POTASSIUM: 3.9 meq/L (ref 3.5–5.1)
SODIUM: 140 meq/L (ref 135–145)
TOTAL PROTEIN: 7.2 g/dL (ref 6.0–8.3)
Total Bilirubin: 0.4 mg/dL (ref 0.2–1.2)

## 2014-09-29 MED ORDER — TRIAMTERENE-HCTZ 37.5-25 MG PO TABS: 1.0000 | ORAL_TABLET | Freq: Every morning | ORAL | Status: DC

## 2014-09-29 MED ORDER — AMLODIPINE BESYLATE 5 MG PO TABS: 5.0000 mg | ORAL_TABLET | Freq: Every day | ORAL | Status: DC

## 2014-09-29 MED ORDER — METOPROLOL SUCCINATE ER 50 MG PO TB24: 50.0000 mg | ORAL_TABLET | Freq: Every day | ORAL | Status: DC

## 2014-09-29 MED ORDER — CYCLOBENZAPRINE HCL 10 MG PO TABS: 10.0000 mg | ORAL_TABLET | Freq: Three times a day (TID) | ORAL | Status: DC | PRN

## 2014-09-29 NOTE — Patient Instructions (Signed)
Go to the lab on the way out.  We'll contact you with your lab report. I sent your refills.   Take care.  Get back in the gym when you can.   Glad to see you.   Use the antifungal cream daily until the rash is gone and then for another week. That should cover it.

## 2014-09-29 NOTE — Progress Notes (Signed)
Pre visit review using our clinic review tool, if applicable. No additional management support is needed unless otherwise documented below in the visit note.  CPE- See plan.  Routine anticipatory guidance given to patient.  See health maintenance. Tetanus 2009 PNA and shingles not due.   Flu shot due.  Prostate cancer screening and PSA options (with potential risks and benefits of testing vs not testing) were discussed along with recent recs/guidelines.  He declined testing PSA at this point. D/w patient [id number] for colon cancer screening, including IFOB vs. colonoscopy.   Risks and benefits of both were discussed and patient voiced understanding.  Pt elects for: IFOB.  Diet and exercise.  He's going to try to get back in the gym, d/w pt.   Living will d/w pt.  He'll consider.  He would want his sister Luis Burke designated if he were incapacitated.    Hypertension:    Using medication without problems or lightheadedness: yes Chest pain with exertion:no Edema:no Short of breath:no Average home BPs: usually similar to today, occ lower.  Better with more exercise.  D/w pt.    Rash in groin, not painful.  Can wax and wane.  R groin.  Prev penile irritation resolved.    PMH and SH reviewed  Meds, vitals, and allergies reviewed.   ROS: See HPI.  Otherwise negative.    GEN: nad, alert and oriented HEENT: mucous membranes moist NECK: supple w/o LA CV: rrr. 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

## 2014-09-30 ENCOUNTER — Encounter: Payer: Self-pay | Admitting: *Deleted

## 2014-09-30 ENCOUNTER — Telehealth: Payer: Self-pay | Admitting: Family Medicine

## 2014-09-30 DIAGNOSIS — B356 Tinea cruris: Secondary | ICD-10-CM | POA: Insufficient documentation

## 2014-09-30 NOTE — Assessment & Plan Note (Signed)
Use OTC antifungal consistently until resolved and then for a few more days.  He agrees. Fu prn. Reassured.

## 2014-09-30 NOTE — Telephone Encounter (Signed)
emmi emailed °

## 2014-09-30 NOTE — Assessment & Plan Note (Signed)
Routine anticipatory guidance given to patient.  See health maintenance. Tetanus 2009 PNA and shingles not due.   Flu shot due.  Prostate cancer screening and PSA options (with potential risks and benefits of testing vs not testing) were discussed along with recent recs/guidelines.  He declined testing PSA at this point. D/w patient [id number] for colon cancer screening, including IFOB vs. colonoscopy.   Risks and benefits of both were discussed and patient voiced understanding.  Pt elects for: IFOB.  Diet and exercise.  He's going to try to get back in the gym, d/w pt.   Living will d/w pt.  He'll consider.  He would want his sister [name] designated if he were incapacitated.

## 2014-09-30 NOTE — Assessment & Plan Note (Addendum)
Minimally elevated, he'll get back in the gym.  No change in meds.  See notes on labs.

## 2014-11-22 ENCOUNTER — Other Ambulatory Visit (INDEPENDENT_AMBULATORY_CARE_PROVIDER_SITE_OTHER): Payer: BLUE CROSS/BLUE SHIELD

## 2014-11-22 DIAGNOSIS — Z1211 Encounter for screening for malignant neoplasm of colon: Secondary | ICD-10-CM

## 2014-11-22 LAB — FECAL OCCULT BLOOD, IMMUNOCHEMICAL: Fecal Occult Bld: NEGATIVE

## 2014-11-25 ENCOUNTER — Encounter: Payer: Self-pay | Admitting: *Deleted

## 2015-09-25 ENCOUNTER — Other Ambulatory Visit: Payer: Self-pay | Admitting: *Deleted

## 2015-09-25 MED ORDER — METOPROLOL SUCCINATE ER 50 MG PO TB24: 50.0000 mg | ORAL_TABLET | Freq: Every day | ORAL | Status: DC

## 2015-09-25 MED ORDER — TRIAMTERENE-HCTZ 37.5-25 MG PO TABS: 1.0000 | ORAL_TABLET | Freq: Every morning | ORAL | Status: DC

## 2015-09-25 MED ORDER — AMLODIPINE BESYLATE 5 MG PO TABS: 5.0000 mg | ORAL_TABLET | Freq: Every day | ORAL | Status: DC

## 2015-09-25 NOTE — Telephone Encounter (Signed)
Received faxed refill request from pharmacy. Refills sent in electronically.

## 2015-09-25 NOTE — Telephone Encounter (Signed)
Received faxed refill request from pharmacy. Refill sent in electronically to pharmacy.

## 2015-10-26 ENCOUNTER — Other Ambulatory Visit: Payer: Self-pay

## 2015-10-26 ENCOUNTER — Other Ambulatory Visit: Payer: Self-pay | Admitting: *Deleted

## 2015-10-26 MED ORDER — TRIAMTERENE-HCTZ 37.5-25 MG PO TABS: 1.0000 | ORAL_TABLET | Freq: Every morning | ORAL | Status: DC

## 2015-10-26 MED ORDER — METOPROLOL SUCCINATE ER 50 MG PO TB24: 50.0000 mg | ORAL_TABLET | Freq: Every day | ORAL | Status: DC

## 2015-10-26 MED ORDER — AMLODIPINE BESYLATE 5 MG PO TABS: 5.0000 mg | ORAL_TABLET | Freq: Every day | ORAL | Status: DC

## 2015-10-26 NOTE — Telephone Encounter (Signed)
Pt out of amlodipine,metoprolol and triamterene-HCTZ to [place]. Pt has CPX on 11/23/15. Refilled # 30 on each until seen.

## 2015-11-12 ENCOUNTER — Other Ambulatory Visit: Payer: Self-pay | Admitting: Family Medicine

## 2015-11-12 DIAGNOSIS — I10 Essential (primary) hypertension: Secondary | ICD-10-CM

## 2015-11-16 ENCOUNTER — Other Ambulatory Visit (INDEPENDENT_AMBULATORY_CARE_PROVIDER_SITE_OTHER): Payer: BLUE CROSS/BLUE SHIELD

## 2015-11-16 DIAGNOSIS — I10 Essential (primary) hypertension: Secondary | ICD-10-CM

## 2015-11-16 DIAGNOSIS — R5382 Chronic fatigue, unspecified: Secondary | ICD-10-CM | POA: Diagnosis not present

## 2015-11-16 LAB — COMPREHENSIVE METABOLIC PANEL
ALBUMIN: 4.5 g/dL (ref 3.5–5.2)
ALK PHOS: 52 U/L (ref 39–117)
ALT: 28 U/L (ref 0–53)
AST: 23 U/L (ref 0–37)
BILIRUBIN TOTAL: 0.7 mg/dL (ref 0.2–1.2)
BUN: 17 mg/dL (ref 6–23)
CO2: 32 mEq/L (ref 19–32)
Calcium: 10.3 mg/dL (ref 8.4–10.5)
Chloride: 103 mEq/L (ref 96–112)
Creatinine, Ser: 1.26 mg/dL (ref 0.40–1.50)
GFR: 62.1 mL/min (ref >60.00)
Glucose, Bld: 110 mg/dL — ABNORMAL HIGH (ref 70–99)
POTASSIUM: 4.7 meq/L (ref 3.5–5.1)
SODIUM: 140 meq/L (ref 135–145)
TOTAL PROTEIN: 7 g/dL (ref 6.0–8.3)

## 2015-11-16 LAB — LIPID PANEL
CHOLESTEROL: 164 mg/dL (ref 0–200)
HDL: 40.8 mg/dL (ref >39.00)
LDL Cholesterol: 106 mg/dL — ABNORMAL HIGH (ref 0–99)
NONHDL: 123.36
Total CHOL/HDL Ratio: 4
Triglycerides: 88 mg/dL (ref 0.0–149.0)
VLDL: 17.6 mg/dL (ref 0.0–40.0)

## 2015-11-16 LAB — TESTOSTERONE: TESTOSTERONE: 355.93 ng/dL (ref 300.00–890.00)

## 2015-11-23 ENCOUNTER — Encounter: Payer: Self-pay | Admitting: Family Medicine

## 2015-11-23 ENCOUNTER — Ambulatory Visit (INDEPENDENT_AMBULATORY_CARE_PROVIDER_SITE_OTHER): Payer: BLUE CROSS/BLUE SHIELD | Admitting: Family Medicine

## 2015-11-23 VITALS — BP 140/90 | HR 74 | Temp 98.5°F | Ht 70.0 in | Wt 198.2 lb

## 2015-11-23 DIAGNOSIS — Z119 Encounter for screening for infectious and parasitic diseases, unspecified: Secondary | ICD-10-CM

## 2015-11-23 DIAGNOSIS — Z1211 Encounter for screening for malignant neoplasm of colon: Secondary | ICD-10-CM

## 2015-11-23 DIAGNOSIS — Z0000 Encounter for general adult medical examination without abnormal findings: Secondary | ICD-10-CM | POA: Diagnosis not present

## 2015-11-23 DIAGNOSIS — Z23 Encounter for immunization: Secondary | ICD-10-CM

## 2015-11-23 DIAGNOSIS — Z7189 Other specified counseling: Secondary | ICD-10-CM

## 2015-11-23 DIAGNOSIS — I10 Essential (primary) hypertension: Secondary | ICD-10-CM

## 2015-11-23 MED ORDER — METOPROLOL SUCCINATE ER 50 MG PO TB24: 50.0000 mg | ORAL_TABLET | Freq: Every day | ORAL | Status: DC

## 2015-11-23 MED ORDER — AMLODIPINE BESYLATE 5 MG PO TABS: 5.0000 mg | ORAL_TABLET | Freq: Every day | ORAL | Status: DC

## 2015-11-23 MED ORDER — TRIAMTERENE-HCTZ 37.5-25 MG PO TABS: 1.0000 | ORAL_TABLET | Freq: Every morning | ORAL | Status: DC

## 2015-11-23 NOTE — Patient Instructions (Addendum)
Check with your insurance to see if they will cover the shingles shot when you turn 60.   Go to the lab on the way out.  We'll contact you with your lab report. If you BP stays up, then let me know.   Warm compresses on the spot on the side of your nose- looks like a partially occluded pore.  Take care.  Glad to see you.

## 2015-11-23 NOTE — Progress Notes (Signed)
Pre visit review using our clinic review tool, if applicable. No additional management support is needed unless otherwise documented below in the visit note.  CPE- See plan.  Routine anticipatory guidance given to patient.  See health maintenance. Tetanus 2009 PNA and shingles not due.  Flu shot done today 2017 Prostate cancer screening and PSA options (with potential risks and benefits of testing vs not testing) were discussed along with recent recs/guidelines. He declined testing PSA at this point. D/w patient [id number] for colon cancer screening, including IFOB vs. colonoscopy.  Risks and benefits of both were discussed and patient voiced understanding. Pt elects for: IFOB.  Diet and exercise. He's been going back to the gym, d/w pt. doing well.   Living will d/w pt. He'll consider. He would want his sister Luis Burke designated if he were incapacitated.  Pt opts in for HCV screening.  D/w pt re: routine screening.    Skin lesion on the L side of the nose, he wanted evaluated.    T level was normal, d/w pt.   Hypertension: Using medication without problems or lightheadedness: yes Chest pain with exertion:no Edema:no Short of breath:no FH CAD noted but his father didn't have CAD until ~age 66.   His exercise tolerance is still good.   Labs d/w pt.   PMH and SH reviewed  Meds, vitals, and allergies reviewed.   ROS: See HPI.  Otherwise negative.    GEN: nad, alert and oriented HEENT: mucous membranes moist NECK: supple w/o LA CV: rrr. PULM: ctab, no inc wob ABD: soft, +bs EXT: no edema SKIN: no acute rash but he does have an enlarged pore on the L side of the nose.  This doesn't look atypical or alarming, dw pt.   Recheck BP 140/90.

## 2015-11-24 DIAGNOSIS — Z7189 Other specified counseling: Secondary | ICD-10-CM | POA: Insufficient documentation

## 2015-11-24 NOTE — Assessment & Plan Note (Signed)
Tetanus 2009 PNA and shingles not due.  Flu shot done today 2017 Prostate cancer screening and PSA options (with potential risks and benefits of testing vs not testing) were discussed along with recent recs/guidelines. He declined testing PSA at this point. D/w patient [id number] for colon cancer screening, including IFOB vs. colonoscopy.  Risks and benefits of both were discussed and patient voiced understanding. Pt elects for: IFOB.  Diet and exercise. He's been going back to the gym, d/w pt. doing well.   Living will d/w pt. He'll consider. He would want his sister [name] designated if he were incapacitated.  Pt opts in for HCV screening.  D/w pt re: routine screening.

## 2015-11-24 NOTE — Assessment & Plan Note (Signed)
Controlled, he'll continue work on diet and exercise, esp for glucose.  No change in meds.  Labs d/w pt. He agrees.

## 2016-11-20 ENCOUNTER — Other Ambulatory Visit: Payer: Self-pay | Admitting: Family Medicine

## 2016-12-19 ENCOUNTER — Ambulatory Visit (INDEPENDENT_AMBULATORY_CARE_PROVIDER_SITE_OTHER): Payer: BLUE CROSS/BLUE SHIELD | Admitting: Family Medicine

## 2016-12-19 ENCOUNTER — Encounter: Payer: Self-pay | Admitting: Family Medicine

## 2016-12-19 VITALS — BP 134/76 | HR 87 | Temp 97.8°F | Ht 67.75 in | Wt 203.2 lb

## 2016-12-19 DIAGNOSIS — I10 Essential (primary) hypertension: Secondary | ICD-10-CM | POA: Diagnosis not present

## 2016-12-19 DIAGNOSIS — Z119 Encounter for screening for infectious and parasitic diseases, unspecified: Secondary | ICD-10-CM | POA: Diagnosis not present

## 2016-12-19 DIAGNOSIS — Z0000 Encounter for general adult medical examination without abnormal findings: Secondary | ICD-10-CM | POA: Diagnosis not present

## 2016-12-19 DIAGNOSIS — Z1211 Encounter for screening for malignant neoplasm of colon: Secondary | ICD-10-CM

## 2016-12-19 LAB — COMPREHENSIVE METABOLIC PANEL
ALBUMIN: 4.7 g/dL (ref 3.5–5.2)
ALK PHOS: 48 U/L (ref 39–117)
ALT: 32 U/L (ref 0–53)
AST: 25 U/L (ref 0–37)
BUN: 15 mg/dL (ref 6–23)
CO2: 33 mEq/L — ABNORMAL HIGH (ref 19–32)
CREATININE: 1.23 mg/dL (ref 0.40–1.50)
Calcium: 10 mg/dL (ref 8.4–10.5)
Chloride: 104 mEq/L (ref 96–112)
GFR: 63.62 mL/min (ref >60.00)
Glucose, Bld: 99 mg/dL (ref 70–99)
Potassium: 4.1 mEq/L (ref 3.5–5.1)
SODIUM: 140 meq/L (ref 135–145)
TOTAL PROTEIN: 7.2 g/dL (ref 6.0–8.3)
Total Bilirubin: 0.7 mg/dL (ref 0.2–1.2)

## 2016-12-19 LAB — LIPID PANEL
CHOLESTEROL: 187 mg/dL (ref 0–200)
HDL: 39.5 mg/dL (ref >39.00)
LDL Cholesterol: 127 mg/dL — ABNORMAL HIGH (ref 0–99)
NonHDL: 147.04
Total CHOL/HDL Ratio: 5
Triglycerides: 101 mg/dL (ref 0.0–149.0)
VLDL: 20.2 mg/dL (ref 0.0–40.0)

## 2016-12-19 LAB — HEPATITIS C ANTIBODY: HCV AB: NEGATIVE

## 2016-12-19 MED ORDER — TRIAMTERENE-HCTZ 37.5-25 MG PO TABS: 1.0000 | ORAL_TABLET | Freq: Every morning | ORAL | 3 refills | Status: DC

## 2016-12-19 MED ORDER — AMLODIPINE BESYLATE 5 MG PO TABS: 5.0000 mg | ORAL_TABLET | Freq: Every day | ORAL | 3 refills | Status: DC

## 2016-12-19 MED ORDER — METOPROLOL SUCCINATE ER 50 MG PO TB24: ORAL_TABLET | ORAL | 3 refills | Status: DC

## 2016-12-19 NOTE — Patient Instructions (Addendum)
Check with your insurance to see if they will cover the shingles shot. We are likely going to get the new shot here in the clinic in summer of 2018.  Go to the lab on the way out.  We'll contact you with your lab report. Take care.  Glad to see you.  Update me as needed.

## 2016-12-19 NOTE — Assessment & Plan Note (Signed)
Continue current meds.  See notes on labs.  He may have transient lower BP with laughing, possibly relative dec in CO2 with events.  D/w pt.  If persistent then he'll update me.

## 2016-12-19 NOTE — Progress Notes (Signed)
Pre visit review using our clinic review tool, if applicable. No additional management support is needed unless otherwise documented below in the visit note. 

## 2016-12-19 NOTE — Progress Notes (Signed)
CPE- See plan.  Routine anticipatory guidance given to patient.  See health maintenance.  The possibility exists that previously documented standard health maintenance information may have been brought forward from a previous encounter into this note.  If needed, that same information has been updated to reflect the current situation based on today's encounter.    Tetanus 2009 PNA not due.  shingles d/w pt.   Flu shot out of stock.  Encouraged.   Prostate cancer screening and PSA options (with potential risks and benefits of testing vs not testing) were discussed along with recent recs/guidelines. He declined testing PSA at this point.   D/w patient TX:[id number] for colon cancer screening, including IFOB vs. colonoscopy.  Risks and benefits of both were discussed and patient voiced understanding. Pt elects for: IFOB.  Diet and exercise. He's been going back to the gym, d/w pt. doing well.   Living will d/w pt. He'll consider. He would want his sister [name] designated if he were incapacitated.  Pt opts in for HCV screening.  D/w pt re: routine screening. Due for labs.    Some neck and upper back discomfort.  It gets better with exercise. He got a new pillow.  Has a good mattress.  Hasn't been stretching as much.  Heat helps. No trauma.    Hypertension:    Using medication without problems or lightheadedness: yes generally, see below. Chest pain with exertion:no Edema:no Short of breath:no He can get lightheaded only if laughing really hard.  Transient.  No syncope.  D/w pt.  He'll update me as needed.    PMH and SH reviewed  Meds, vitals, and allergies reviewed.   ROS: Per HPI.  Unless specifically indicated otherwise in HPI, the patient denies:  General: fever. Eyes: acute vision changes ENT: sore throat Cardiovascular: chest pain Respiratory: SOB GI: vomiting GU: dysuria Musculoskeletal: acute back pain Derm: acute rash Neuro: acute motor dysfunction Psych:  worsening mood Endocrine: polydipsia Heme: bleeding Allergy: hayfever  GEN: nad, alert and oriented HEENT: mucous membranes moist NECK: supple w/o LA, slight discomfort with ROM but not a stiff neck.  No midline pain.  CV: rrr. PULM: ctab, no inc wob ABD: soft, +bs EXT: no edema SKIN: no acute rash

## 2016-12-19 NOTE — Assessment & Plan Note (Addendum)
Tetanus 2009 PNA not due.  shingles d/w pt.   Flu shot out of stock.  Encouraged.   Prostate cancer screening and PSA options (with potential risks and benefits of testing vs not testing) were discussed along with recent recs/guidelines. He declined testing PSA at this point.   D/w patient [id number] for colon cancer screening, including IFOB vs. colonoscopy.  Risks and benefits of both were discussed and patient voiced understanding. Pt elects for: IFOB.  Diet and exercise. He's been going back to the gym, d/w pt. doing well.   Living will d/w pt. He'll consider. He would want his sister [name] designated if he were incapacitated.  Pt opts in for HCV screening.  D/w pt re: routine screening. Due for labs.    D/w pt about routine stretching and massage for his neck.

## 2017-02-20 ENCOUNTER — Encounter (INDEPENDENT_AMBULATORY_CARE_PROVIDER_SITE_OTHER): Payer: Self-pay

## 2017-02-20 ENCOUNTER — Encounter: Payer: Self-pay | Admitting: Primary Care

## 2017-02-20 ENCOUNTER — Ambulatory Visit (INDEPENDENT_AMBULATORY_CARE_PROVIDER_SITE_OTHER): Payer: BLUE CROSS/BLUE SHIELD | Admitting: Primary Care

## 2017-02-20 VITALS — BP 136/88 | HR 73 | Temp 98.4°F | Wt 200.5 lb

## 2017-02-20 DIAGNOSIS — M25562 Pain in left knee: Secondary | ICD-10-CM

## 2017-02-20 MED ORDER — DICLOFENAC SODIUM 75 MG PO TBEC: 75.0000 mg | DELAYED_RELEASE_TABLET | Freq: Two times a day (BID) | ORAL | 0 refills | Status: DC | PRN

## 2017-02-20 NOTE — Progress Notes (Signed)
Subjective:    Patient ID: Luis Burke, male    DOB: 07/01/1956, 61 y.o.   MRN: [id number]  HPI  Mr. Pangilinan is a 61 year old male who presents today with a chief complaint of knee pain. His pain is located to the left popliteal fossa and anterior patella that began three days ago. He has noticed some swelling that has reduced since yesterday. He's taken Ibuprofen and has applied peppermint oil with some improvement. Yesterday he experienced pain with flexion and extension while at work, pain with walking. He denies recent injury/trauma, numbness/tingling. He does stand on concrete for most of his work day.   Review of Systems  Musculoskeletal: Positive for arthralgias. Negative for back pain.  Skin: Negative for color change.  Neurological: Negative for numbness.       Past Medical History:  Diagnosis Date  . Hyperlipidemia 03/2004  . Hypertension 1994     Social History   Social History  . Marital status: Divorced    Spouse name: N/A  . Number of children: 1  . Years of education: N/A   Occupational History  . [employer]     Former Higher education careers adviser   Social History Main Topics  . Smoking status: Never Smoker  . Smokeless tobacco: Never Used     Comment: Occassionally  . Alcohol use 0.0 oz/week    2 - 3 drink(s) per week     Comment: beers weekly  . Drug use: No  . Sexual activity: Not on file   Other Topics Concern  . Not on file   Social History Narrative   Divorced February 05, 2008, dating as of 02-04-2017   His daughter died 4 days after giving birth to the patient's granddaughter   Has joint custody of his dead daughter's child   Works at [employer] is [employer]   Mother died in 02-04-14    Past Surgical History:  Procedure Laterality Date  . HERNIA REPAIR    . L Lap herniorraphy  04/2000  . Left wrist ganglionectomy      Family History  Problem Relation Age of Onset  . Hypertension Mother   . Diabetes Mother   . Heart disease Mother        CAD  . Dementia  Mother   . Hypertension Father   . Heart disease Father        MI 02/03  . Hypertension Sister   . Hypertension Sister   . Hypertension Sister   . Colon cancer Neg Hx   . Prostate cancer Neg Hx     Allergies  Allergen Reactions  . Tadalafil     REACTION: made him feel funny    Current Outpatient Prescriptions on File Prior to Visit  Medication Sig Dispense Refill  . amLODipine (NORVASC) 5 MG tablet Take 1 tablet (5 mg total) by mouth daily. 90 tablet 3  . metoprolol succinate (TOPROL-XL) 50 MG 24 hr tablet TAKE 1 TABLET BY MOUTH DAILY TAKE WITH OR IMMEDIATELY FOLLOWING A MEAL 90 tablet 3  . Omega-3 Fatty Acids (FISH OIL) 1000 MG CAPS Take by mouth daily. Sometimes.    . Protein POWD Take by mouth daily.    Marland Kitchen triamterene-hydrochlorothiazide (MAXZIDE-25) 37.5-25 MG tablet Take 1 tablet by mouth every morning. 90 tablet 3  . Ubiquinone (ULTRA COQ10) 75 MG CAPS Take by mouth daily.     No current facility-administered medications on file prior to visit.     BP 136/88   Pulse 73  Temp 98.4 F (36.9 C) (Oral)   Wt 200 lb 8 oz (90.9 kg)   SpO2 95%   BMI 30.71 kg/m    Objective:   Physical Exam  Constitutional: He appears well-nourished.  Neck: Neck supple.  Cardiovascular: Normal rate and regular rhythm.   Pulmonary/Chest: Effort normal and breath sounds normal.  Musculoskeletal:       Left knee: He exhibits swelling. He exhibits normal range of motion and no bony tenderness. Tenderness found. Lateral joint line tenderness noted.  Minimal swelling, good ROM, 5/5 and equal strength bilaterally.  Skin: Skin is warm and dry.          Assessment & Plan:  Acute Knee Pain:  Located to left knee x 3 days. No injury/trauma. Exam today with very minimal swelling, no laxity to ligaments/joints, no baker's cyst noted to popliteal fossa. Suspect bursitis secondary to prolonged periods of standing on concrete. Discussed to purchase shoe insoles, ice, elevation, knee  brace. Rx for diclofenac sent to pharmacy to use PRN. Hold other NSAIDs. Follow up PRN.  Sheral Flow, NP

## 2017-02-20 NOTE — Patient Instructions (Signed)
You may take diclofenac 75 mg tablets twice daily as needed for pain and inflammation.  Consider purchasing a knee brace for support. Consider purchasing insoles for your shoes.  Ice your knee and elevate when resting.  It was a pleasure meeting you!

## 2017-02-20 NOTE — Progress Notes (Signed)
Pre visit review using our clinic review tool, if applicable. No additional management support is needed unless otherwise documented below in the visit note. 

## 2017-05-10 ENCOUNTER — Emergency Department
Admission: EM | Admit: 2017-05-10 | Discharge: 2017-05-10 | Disposition: A | Discharge status: Home / Self Care | Payer: BLUE CROSS/BLUE SHIELD | Attending: Emergency Medicine | Admitting: Emergency Medicine

## 2017-05-10 ENCOUNTER — Encounter: Payer: Self-pay | Admitting: Emergency Medicine

## 2017-05-10 DIAGNOSIS — Y939 Activity, unspecified: Secondary | ICD-10-CM | POA: Diagnosis not present

## 2017-05-10 DIAGNOSIS — I10 Essential (primary) hypertension: Secondary | ICD-10-CM | POA: Insufficient documentation

## 2017-05-10 DIAGNOSIS — Y92410 Unspecified street and highway as the place of occurrence of the external cause: Secondary | ICD-10-CM | POA: Diagnosis not present

## 2017-05-10 DIAGNOSIS — S3992XA Unspecified injury of lower back, initial encounter: Secondary | ICD-10-CM | POA: Diagnosis present

## 2017-05-10 DIAGNOSIS — Y999 Unspecified external cause status: Secondary | ICD-10-CM | POA: Insufficient documentation

## 2017-05-10 DIAGNOSIS — S39012A Strain of muscle, fascia and tendon of lower back, initial encounter: Secondary | ICD-10-CM | POA: Diagnosis not present

## 2017-05-10 DIAGNOSIS — Z79899 Other long term (current) drug therapy: Secondary | ICD-10-CM | POA: Insufficient documentation

## 2017-05-10 MED ORDER — KETOROLAC TROMETHAMINE 30 MG/ML IJ SOLN
30.0000 mg | Freq: Once | INTRAMUSCULAR | Status: AC
  Administered 2017-05-10: 30 mg via INTRAVENOUS
  Filled 2017-05-10: qty 1

## 2017-05-10 MED ORDER — MELOXICAM 15 MG PO TABS
15.0000 mg | ORAL_TABLET | Freq: Every day | ORAL | 0 refills | Status: DC
Start: 2017-05-10 — End: 2018-06-09

## 2017-05-10 MED ORDER — METHOCARBAMOL 500 MG PO TABS: 500.0000 mg | ORAL_TABLET | Freq: Four times a day (QID) | ORAL | 0 refills | Status: DC
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
## 2017-05-10 NOTE — ED Notes (Signed)
AAOx3.  Skin warm and dry. NAD.  Ambulates with easy and steady gait.   

## 2017-12-11 DIAGNOSIS — H2513 Age-related nuclear cataract, bilateral: Secondary | ICD-10-CM | POA: Diagnosis not present

## 2017-12-19 ENCOUNTER — Encounter: Payer: Self-pay | Admitting: *Deleted

## 2017-12-19 ENCOUNTER — Other Ambulatory Visit: Payer: Self-pay | Admitting: *Deleted

## 2017-12-19 MED ORDER — METOPROLOL SUCCINATE ER 50 MG PO TB24: ORAL_TABLET | ORAL | 0 refills | Status: DC

## 2017-12-19 MED ORDER — AMLODIPINE BESYLATE 5 MG PO TABS: 5.0000 mg | ORAL_TABLET | Freq: Every day | ORAL | 0 refills | Status: DC

## 2017-12-19 MED ORDER — TRIAMTERENE-HCTZ 37.5-25 MG PO TABS: 1.0000 | ORAL_TABLET | Freq: Every morning | ORAL | 0 refills | Status: DC

## 2018-02-12 DIAGNOSIS — H2511 Age-related nuclear cataract, right eye: Secondary | ICD-10-CM | POA: Diagnosis not present

## 2018-03-04 ENCOUNTER — Encounter
Admission: RE | Disposition: A | Discharge status: Home / Self Care | Payer: Self-pay | Source: Ambulatory Visit | Attending: Ophthalmology

## 2018-03-04 ENCOUNTER — Ambulatory Visit: Payer: BLUE CROSS/BLUE SHIELD | Admitting: Anesthesiology

## 2018-03-04 ENCOUNTER — Ambulatory Visit
Admission: RE | Admit: 2018-03-04 | Discharge: 2018-03-04 | Disposition: A | Discharge status: Home / Self Care | Payer: BLUE CROSS/BLUE SHIELD | Source: Ambulatory Visit | Attending: Ophthalmology | Admitting: Ophthalmology

## 2018-03-04 DIAGNOSIS — Z79899 Other long term (current) drug therapy: Secondary | ICD-10-CM | POA: Diagnosis not present

## 2018-03-04 DIAGNOSIS — H2511 Age-related nuclear cataract, right eye: Secondary | ICD-10-CM | POA: Diagnosis not present

## 2018-03-04 DIAGNOSIS — I10 Essential (primary) hypertension: Secondary | ICD-10-CM | POA: Diagnosis not present

## 2018-03-04 DIAGNOSIS — H25811 Combined forms of age-related cataract, right eye: Secondary | ICD-10-CM | POA: Diagnosis not present

## 2018-03-04 HISTORY — PX: CATARACT EXTRACTION W/PHACO: SHX586

## 2018-03-04 HISTORY — DX: Snoring: R06.83

## 2018-03-04 SURGERY — PHACOEMULSIFICATION, CATARACT, WITH IOL INSERTION
Anesthesia: Monitor Anesthesia Care | Site: Eye | Laterality: Right | Wound class: Clean

## 2018-03-04 MED ORDER — LACTATED RINGERS IV SOLN: 10.0000 mL/h | INTRAVENOUS | Status: DC

## 2018-03-04 MED ORDER — ACETAMINOPHEN 160 MG/5ML PO SOLN: 325.0000 mg | ORAL | Status: DC | PRN

## 2018-03-04 MED ORDER — EPINEPHRINE PF 1 MG/ML IJ SOLN
INTRAOCULAR | Status: DC | PRN
  Administered 2018-03-04: 54 mL via OPHTHALMIC

## 2018-03-04 MED ORDER — BRIMONIDINE TARTRATE-TIMOLOL 0.2-0.5 % OP SOLN
OPHTHALMIC | Status: DC | PRN
  Administered 2018-03-04: 1 [drp] via OPHTHALMIC

## 2018-03-04 MED ORDER — ARMC OPHTHALMIC DILATING DROPS
1.0000 "application " | OPHTHALMIC | Status: DC | PRN
  Administered 2018-03-04 (×3): 1 via OPHTHALMIC

## 2018-03-04 MED ORDER — CEFUROXIME OPHTHALMIC INJECTION 1 MG/0.1 ML
INJECTION | OPHTHALMIC | Status: DC | PRN
  Administered 2018-03-04: .3 mL via OPHTHALMIC

## 2018-03-04 MED ORDER — LIDOCAINE HCL (PF) 2 % IJ SOLN
INTRAOCULAR | Status: DC | PRN
  Administered 2018-03-04: 1 mL via INTRAMUSCULAR

## 2018-03-04 MED ORDER — MIDAZOLAM HCL 2 MG/2ML IJ SOLN
INTRAMUSCULAR | Status: DC | PRN
  Administered 2018-03-04: 2 mg via INTRAVENOUS

## 2018-03-04 MED ORDER — MOXIFLOXACIN HCL 0.5 % OP SOLN
1.0000 [drp] | OPHTHALMIC | Status: DC | PRN
  Administered 2018-03-04 (×3): 1 [drp] via OPHTHALMIC

## 2018-03-04 MED ORDER — NA HYALUR & NA CHOND-NA HYALUR 0.4-0.35 ML IO KIT
PACK | INTRAOCULAR | Status: DC | PRN
  Administered 2018-03-04: 1 mL via INTRAOCULAR

## 2018-03-04 MED ORDER — ACETAMINOPHEN 325 MG PO TABS: 325.0000 mg | ORAL_TABLET | ORAL | Status: DC | PRN

## 2018-03-04 MED ORDER — FENTANYL CITRATE (PF) 100 MCG/2ML IJ SOLN
INTRAMUSCULAR | Status: DC | PRN
  Administered 2018-03-04: 100 ug via INTRAVENOUS

## 2018-03-04 MED ORDER — ONDANSETRON HCL 4 MG/2ML IJ SOLN: 4.0000 mg | Freq: Once | INTRAMUSCULAR | Status: DC | PRN

## 2018-03-04 SURGICAL SUPPLY — 27 items

## 2018-03-04 NOTE — H&P (Signed)
The History and Physical notes are on paper, have been signed, and are to be scanned. The patient remains stable and unchanged from the H&P.   Previous H&P reviewed, patient examined, and there are no changes.  Luis Burke 03/04/2018 9:01 AM

## 2018-03-04 NOTE — Anesthesia Procedure Notes (Signed)
Procedure Name: MAC Date/Time: 03/04/2018 9:32 AM Performed by: Janna Arch, CRNA Pre-anesthesia Checklist: Patient identified, Emergency Drugs available, Suction available and Patient being monitored Patient Re-evaluated:Patient Re-evaluated prior to induction Oxygen Delivery Method: Nasal cannula

## 2018-03-04 NOTE — Anesthesia Preprocedure Evaluation (Signed)
Anesthesia Evaluation  Patient identified by MRN, date of birth, ID band Patient awake    Reviewed: Allergy & Precautions, NPO status , Patient's Chart, lab work & pertinent test results  History of Anesthesia Complications Negative for: history of anesthetic complications  Airway Mallampati: I  TM Distance: >3 FB Neck ROM: Full    Dental no notable dental hx.    Pulmonary  Snoring    Pulmonary exam normal breath sounds clear to auscultation       Cardiovascular Exercise Tolerance: Good hypertension, Normal cardiovascular exam Rhythm:Regular Rate:Normal     Neuro/Psych negative neurological ROS     GI/Hepatic negative GI ROS,   Endo/Other  negative endocrine ROS  Renal/GU negative Renal ROS     Musculoskeletal   Abdominal   Peds  Hematology negative hematology ROS (+)   Anesthesia Other Findings   Reproductive/Obstetrics                             Anesthesia Physical Anesthesia Plan  ASA: II  Anesthesia Plan: MAC   Post-op Pain Management:    Induction: Intravenous  PONV Risk Score and Plan: 0 and TIVA and Midazolam  Airway Management Planned: Natural Airway  Additional Equipment:   Intra-op Plan:   Post-operative Plan:   Informed Consent: I have reviewed the patients History and Physical, chart, labs and discussed the procedure including the risks, benefits and alternatives for the proposed anesthesia with the patient or authorized representative who has indicated his/her understanding and acceptance.     Plan Discussed with: CRNA  Anesthesia Plan Comments:         Anesthesia Quick Evaluation

## 2018-03-04 NOTE — Op Note (Signed)
LOCATION:  [facility]   PREOPERATIVE DIAGNOSIS:    Nuclear sclerotic cataract right eye. H25.11   POSTOPERATIVE DIAGNOSIS:  Nuclear sclerotic cataract right eye.     PROCEDURE:  Phacoemusification with posterior chamber intraocular lens placement of the right eye   LENS:   Implant Name Type Inv. Item Serial No. Manufacturer Lot No. LRB No. Used  LENS IOL DIOP 24.0 - [id number] Intraocular Lens LENS IOL DIOP 24.0 [id number]  Right 1        ULTRASOUND TIME: 14 % of 1 minutes, 2 seconds.  CDE 9.2   SURGEON:  Wyonia Hough, MD   ANESTHESIA:  Topical with tetracaine drops and 2% Xylocaine jelly, augmented with 1% preservative-free intracameral lidocaine.    COMPLICATIONS:  None.   DESCRIPTION OF PROCEDURE:  The patient was identified in the holding room and transported to the operating room and placed in the supine position under the operating microscope.  The right eye was identified as the operative eye and it was prepped and draped in the usual sterile ophthalmic fashion.   A 1 millimeter clear-corneal paracentesis was made at the 3:00 position.  0.5 ml of preservative-free 1% lidocaine was injected into the anterior chamber. The anterior chamber was filled with Viscoat viscoelastic.  A 2.4 millimeter keratome was used to make a near-clear corneal incision at the 12:00 position.  A curvilinear capsulorrhexis was made with a cystotome and capsulorrhexis forceps.  Balanced salt solution was used to hydrodissect and hydrodelineate the nucleus.   Phacoemulsification was then used in stop and chop fashion to remove the lens nucleus and epinucleus.  The remaining cortex was then removed using the irrigation and aspiration handpiece. Provisc was then placed into the capsular bag to distend it for lens placement.  A lens was then injected into the capsular bag.  The remaining viscoelastic was aspirated.   Wounds were hydrated with balanced salt solution.  The anterior  chamber was inflated to a physiologic pressure with balanced salt solution.  No wound leaks were noted. Cefuroxime 0.1 ml of a 10mg /ml solution was injected into the anterior chamber for a dose of 1 mg of intracameral antibiotic at the completion of the case.   Timolol and Brimonidine drops were applied to the eye.  The patient was taken to the recovery room in stable condition without complications of anesthesia or surgery.   Symphony Demuro 03/04/2018, 9:50 AM

## 2018-03-04 NOTE — Anesthesia Postprocedure Evaluation (Signed)
Anesthesia Post Note  Patient: Luis Burke  Procedure(s) Performed: CATARACT EXTRACTION PHACO AND INTRAOCULAR LENS PLACEMENT (IOC)  RIGHT TORIC (Right Eye)  Patient location during evaluation: PACU Anesthesia Type: MAC Level of consciousness: awake and alert, oriented and patient cooperative Pain management: pain level controlled Vital Signs Assessment: post-procedure vital signs reviewed and stable Respiratory status: spontaneous breathing, nonlabored ventilation and respiratory function stable Cardiovascular status: blood pressure returned to baseline and stable Postop Assessment: adequate PO intake Anesthetic complications: no    Darrin Nipper

## 2018-03-04 NOTE — Transfer of Care (Signed)
Immediate Anesthesia Transfer of Care Note  Patient: Luis Burke  Procedure(s) Performed: CATARACT EXTRACTION PHACO AND INTRAOCULAR LENS PLACEMENT (IOC)  RIGHT TORIC (Right Eye)  Patient Location: PACU  Anesthesia Type: MAC  Level of Consciousness: awake, alert  and patient cooperative  Airway and Oxygen Therapy: Patient Spontanous Breathing and Patient connected to supplemental oxygen  Post-op Assessment: Post-op Vital signs reviewed, Patient's Cardiovascular Status Stable, Respiratory Function Stable, Patent Airway and No signs of Nausea or vomiting  Post-op Vital Signs: Reviewed and stable  Complications: No apparent anesthesia complications

## 2018-03-05 ENCOUNTER — Encounter: Payer: Self-pay | Admitting: Ophthalmology

## 2018-03-05 ENCOUNTER — Other Ambulatory Visit: Payer: Self-pay

## 2018-03-05 MED ORDER — AMLODIPINE BESYLATE 5 MG PO TABS: 5.0000 mg | ORAL_TABLET | Freq: Every day | ORAL | 0 refills | Status: DC

## 2018-03-05 MED ORDER — METOPROLOL SUCCINATE ER 50 MG PO TB24: ORAL_TABLET | ORAL | 0 refills | Status: DC

## 2018-03-05 MED ORDER — TRIAMTERENE-HCTZ 37.5-25 MG PO TABS: 1.0000 | ORAL_TABLET | Freq: Every morning | ORAL | 0 refills | Status: DC

## 2018-03-05 NOTE — Telephone Encounter (Signed)
Patient came by stating that he is going out of town on June 7th through 14th and during the trip will run out of medication and would like to get refill on Amlodipine, Metoprolol and Triamterene-HCTZ sent to [facility] please. He does have physical appointment scheduled for July 8th-(that was the first Monday early morning appointment that was available) CB: [phone]. Needs enough refills to last till that [place], RMA

## 2018-03-05 NOTE — Telephone Encounter (Signed)
Sent. Thanks.   

## 2018-04-20 ENCOUNTER — Encounter: Payer: BLUE CROSS/BLUE SHIELD | Admitting: Family Medicine

## 2018-04-20 DIAGNOSIS — Z0289 Encounter for other administrative examinations: Secondary | ICD-10-CM

## 2018-05-01 ENCOUNTER — Other Ambulatory Visit: Payer: Self-pay | Admitting: *Deleted

## 2018-05-01 MED ORDER — AMLODIPINE BESYLATE 5 MG PO TABS: 5.0000 mg | ORAL_TABLET | Freq: Every day | ORAL | 0 refills | Status: DC

## 2018-05-01 MED ORDER — METOPROLOL SUCCINATE ER 50 MG PO TB24: ORAL_TABLET | ORAL | 0 refills | Status: DC

## 2018-05-01 MED ORDER — TRIAMTERENE-HCTZ 37.5-25 MG PO TABS: 1.0000 | ORAL_TABLET | Freq: Every morning | ORAL | 0 refills | Status: DC

## 2018-06-06 ENCOUNTER — Other Ambulatory Visit: Payer: Self-pay | Admitting: Family Medicine

## 2018-06-09 ENCOUNTER — Ambulatory Visit (INDEPENDENT_AMBULATORY_CARE_PROVIDER_SITE_OTHER): Payer: BLUE CROSS/BLUE SHIELD | Admitting: Family Medicine

## 2018-06-09 ENCOUNTER — Encounter: Payer: Self-pay | Admitting: Family Medicine

## 2018-06-09 VITALS — BP 148/102 | HR 74 | Temp 98.3°F | Ht 70.0 in | Wt 204.8 lb

## 2018-06-09 DIAGNOSIS — Z7189 Other specified counseling: Secondary | ICD-10-CM

## 2018-06-09 DIAGNOSIS — I10 Essential (primary) hypertension: Secondary | ICD-10-CM | POA: Diagnosis not present

## 2018-06-09 DIAGNOSIS — R55 Syncope and collapse: Secondary | ICD-10-CM

## 2018-06-09 DIAGNOSIS — Z23 Encounter for immunization: Secondary | ICD-10-CM

## 2018-06-09 DIAGNOSIS — R0683 Snoring: Secondary | ICD-10-CM | POA: Diagnosis not present

## 2018-06-09 DIAGNOSIS — Z0000 Encounter for general adult medical examination without abnormal findings: Secondary | ICD-10-CM

## 2018-06-09 DIAGNOSIS — Z1211 Encounter for screening for malignant neoplasm of colon: Secondary | ICD-10-CM

## 2018-06-09 DIAGNOSIS — Z800 Family history of malignant neoplasm of digestive organs: Secondary | ICD-10-CM

## 2018-06-09 LAB — CBC WITH DIFFERENTIAL/PLATELET
BASOS ABS: 0 10*3/uL (ref 0.0–0.1)
Basophils Relative: 0.4 % (ref 0.0–3.0)
EOS ABS: 0.2 10*3/uL (ref 0.0–0.7)
Eosinophils Relative: 3.5 % (ref 0.0–5.0)
HEMATOCRIT: 32.9 % — AB (ref 39.0–52.0)
Hemoglobin: 11.5 g/dL — ABNORMAL LOW (ref 13.0–17.0)
LYMPHS PCT: 30 % (ref 12.0–46.0)
Lymphs Abs: 1.5 10*3/uL (ref 0.7–4.0)
MCHC: 35.1 g/dL (ref 30.0–36.0)
MCV: 96.3 fl (ref 78.0–100.0)
Monocytes Absolute: 0.5 10*3/uL (ref 0.1–1.0)
Monocytes Relative: 9.3 % (ref 3.0–12.0)
Neutro Abs: 2.9 10*3/uL (ref 1.4–7.7)
Neutrophils Relative %: 56.8 % (ref 43.0–77.0)
Platelets: 103 10*3/uL — ABNORMAL LOW (ref 150.0–400.0)
RBC: 3.41 Mil/uL — ABNORMAL LOW (ref 4.22–5.81)
RDW: 15.2 % (ref 11.5–15.5)
WBC: 5.1 10*3/uL (ref 4.0–10.5)

## 2018-06-09 LAB — COMPREHENSIVE METABOLIC PANEL
ALBUMIN: 4.8 g/dL (ref 3.5–5.2)
ALK PHOS: 51 U/L (ref 39–117)
ALT: 28 U/L (ref 0–53)
AST: 22 U/L (ref 0–37)
BUN: 27 mg/dL — ABNORMAL HIGH (ref 6–23)
CALCIUM: 10.3 mg/dL (ref 8.4–10.5)
CO2: 31 mEq/L (ref 19–32)
CREATININE: 1.59 mg/dL — AB (ref 0.40–1.50)
Chloride: 105 mEq/L (ref 96–112)
GFR: 47.08 mL/min — ABNORMAL LOW (ref >60.00)
Glucose, Bld: 108 mg/dL — ABNORMAL HIGH (ref 70–99)
Potassium: 4.2 mEq/L (ref 3.5–5.1)
Sodium: 141 mEq/L (ref 135–145)
Total Bilirubin: 0.6 mg/dL (ref 0.2–1.2)
Total Protein: 7.4 g/dL (ref 6.0–8.3)

## 2018-06-09 LAB — LIPID PANEL
CHOL/HDL RATIO: 6
Cholesterol: 182 mg/dL (ref 0–200)
HDL: 31.7 mg/dL — ABNORMAL LOW (ref >39.00)
LDL CALC: 120 mg/dL — AB (ref 0–99)
NonHDL: 150.63
TRIGLYCERIDES: 151 mg/dL — AB (ref 0.0–149.0)
VLDL: 30.2 mg/dL (ref 0.0–40.0)

## 2018-06-09 MED ORDER — AMLODIPINE BESYLATE 2.5 MG PO TABS: 2.5000 mg | ORAL_TABLET | Freq: Every day | ORAL | 0 refills | Status: DC

## 2018-06-09 NOTE — Progress Notes (Signed)
CPE- See plan.  Routine anticipatory guidance given to patient.  See health maintenance.  The possibility exists that previously documented standard health maintenance information may have been brought forward from a previous encounter into this note.  If needed, that same information has been updated to reflect the current situation based on today's encounter.    Tetanus 2019 PNA not due.  shingles d/w pt.  out of stock.   Flu shot out of stock.  Encouraged.   Prostate cancer screening and PSA options (with potential risks and benefits of testing vs not testing) were discussed along with recent recs/guidelines. He declined testing PSA at this point.   Colon cancer screening d/w pt.   Diet and exercise. encouraged as tolerated.   Living will d/w pt. He'll consider. He would want his sister [name] designated if he were incapacitated.   Due for labs.  see notes on labs.    Has joint custody of his deceased daughter's child.  His grandchild is doing well.  D/w pt.    Hypertension:    Using medication without problems or lightheadedness: see below.   Chest pain with exertion:no Edema:no Short of breath:no He had an episode at work on 2 separate days.  He was in the air conditioned air and then walked out to the lot on a hot day.  No syncope but felt lightheaded.  These episodes were this summer.   Snoring noted, according to girlfriend "it's pretty bad."  He'll occ have reported episodes of apnea.   His father died, condolences offered. His father had colon cancer.  He was with his father when he died.  D/w pt.    PMH and SH reviewed  Meds, vitals, and allergies reviewed.   ROS: Per HPI.  Unless specifically indicated otherwise in HPI, the patient denies:  General: fever. Eyes: acute vision changes ENT: sore throat Cardiovascular: chest pain Respiratory: SOB GI: vomiting GU: dysuria Musculoskeletal: acute back pain Derm: acute rash Neuro: acute motor  dysfunction Psych: worsening mood Endocrine: polydipsia Heme: bleeding Allergy: hayfever  GEN: nad, alert and oriented HEENT: mucous membranes moist NECK: supple w/o LA CV: rrr. PULM: ctab, no inc wob ABD: soft, +bs EXT: no edema SKIN: no acute rash

## 2018-06-09 NOTE — Patient Instructions (Addendum)
Go to the lab on the way out.  We'll contact you with your lab report. Take care.  Glad to see you.  We will call about your referral.  Luis Burke or Luis Burke will call you if you don't see one of them on the way out.  Cut the amlodipine in half in the meantime.   Drink enough water to keep your urine clear or light colored.  Update me as needed.

## 2018-06-10 ENCOUNTER — Other Ambulatory Visit: Payer: Self-pay | Admitting: Family Medicine

## 2018-06-10 DIAGNOSIS — Z800 Family history of malignant neoplasm of digestive organs: Secondary | ICD-10-CM | POA: Insufficient documentation

## 2018-06-10 DIAGNOSIS — D649 Anemia, unspecified: Secondary | ICD-10-CM

## 2018-06-10 DIAGNOSIS — R0683 Snoring: Secondary | ICD-10-CM | POA: Insufficient documentation

## 2018-06-10 NOTE — Assessment & Plan Note (Signed)
Living will d/w pt. He'll consider. He would want his sister [name] designated if he were incapacitated.

## 2018-06-10 NOTE — Assessment & Plan Note (Signed)
The concern is for episodic or relative hypotension that may be related to overtreatment.  We will decrease amlodipine down to 2.5 mg a day for now.  See notes on labs.  Discussed with patient about making sure he drinks enough fluid.  He is not having chest pain.  EKG without acute changes.  Discussed with patient at office visit.

## 2018-06-10 NOTE — Assessment & Plan Note (Signed)
Concern for sleep apnea.  This could affect his blood pressure.  Discussed.  Refer.

## 2018-06-10 NOTE — Assessment & Plan Note (Signed)
Refer for colonoscopy.  Discussed with patient.

## 2018-06-10 NOTE — Assessment & Plan Note (Addendum)
Tetanus 2019 PNA not due.  shingles d/w pt.  out of stock.   Flu shot out of stock.  Encouraged.   Prostate cancer screening and PSA options (with potential risks and benefits of testing vs not testing) were discussed along with recent recs/guidelines. He declined testing PSA at this point.   Colon cancer screening d/w pt. referred for colonoscopy. Diet and exercise. encouraged as tolerated.   Living will d/w pt. He'll consider. He would want his sister [name] designated if he were incapacitated.

## 2018-06-11 ENCOUNTER — Other Ambulatory Visit: Payer: Self-pay

## 2018-06-11 DIAGNOSIS — Z1211 Encounter for screening for malignant neoplasm of colon: Secondary | ICD-10-CM

## 2018-06-16 ENCOUNTER — Other Ambulatory Visit (INDEPENDENT_AMBULATORY_CARE_PROVIDER_SITE_OTHER): Payer: BLUE CROSS/BLUE SHIELD

## 2018-06-16 DIAGNOSIS — D649 Anemia, unspecified: Secondary | ICD-10-CM

## 2018-06-16 LAB — COMPREHENSIVE METABOLIC PANEL
ALBUMIN: 4.6 g/dL (ref 3.5–5.2)
ALK PHOS: 55 U/L (ref 39–117)
ALT: 23 U/L (ref 0–53)
AST: 19 U/L (ref 0–37)
BILIRUBIN TOTAL: 0.4 mg/dL (ref 0.2–1.2)
BUN: 28 mg/dL — ABNORMAL HIGH (ref 6–23)
CALCIUM: 10 mg/dL (ref 8.4–10.5)
CO2: 28 mEq/L (ref 19–32)
Chloride: 105 mEq/L (ref 96–112)
Creatinine, Ser: 1.59 mg/dL — ABNORMAL HIGH (ref 0.40–1.50)
GFR: 47.07 mL/min — AB (ref >60.00)
GLUCOSE: 116 mg/dL — AB (ref 70–99)
Potassium: 4.2 mEq/L (ref 3.5–5.1)
Sodium: 142 mEq/L (ref 135–145)
TOTAL PROTEIN: 7.1 g/dL (ref 6.0–8.3)

## 2018-06-16 LAB — CBC WITH DIFFERENTIAL/PLATELET
BASOS ABS: 0 10*3/uL (ref 0.0–0.1)
Basophils Relative: 0.5 % (ref 0.0–3.0)
Eosinophils Absolute: 0.2 10*3/uL (ref 0.0–0.7)
Eosinophils Relative: 3.8 % (ref 0.0–5.0)
HEMATOCRIT: 30.7 % — AB (ref 39.0–52.0)
Hemoglobin: 10.8 g/dL — ABNORMAL LOW (ref 13.0–17.0)
LYMPHS PCT: 27.6 % (ref 12.0–46.0)
Lymphs Abs: 1.8 10*3/uL (ref 0.7–4.0)
MCHC: 35.1 g/dL (ref 30.0–36.0)
MCV: 95.8 fl (ref 78.0–100.0)
MONOS PCT: 8.4 % (ref 3.0–12.0)
Monocytes Absolute: 0.5 10*3/uL (ref 0.1–1.0)
NEUTROS PCT: 59.7 % (ref 43.0–77.0)
Neutro Abs: 3.8 10*3/uL (ref 1.4–7.7)
Platelets: 96 10*3/uL — ABNORMAL LOW (ref 150.0–400.0)
RBC: 3.2 Mil/uL — AB (ref 4.22–5.81)
RDW: 14.9 % (ref 11.5–15.5)
WBC: 6.4 10*3/uL (ref 4.0–10.5)

## 2018-06-16 LAB — VITAMIN B12: Vitamin B-12: 304 pg/mL (ref 211–911)

## 2018-06-16 LAB — IBC PANEL
Iron: 81 ug/dL (ref 42–165)
SATURATION RATIOS: 29.2 % (ref 20.0–50.0)
TRANSFERRIN: 198 mg/dL — AB (ref 212.0–360.0)

## 2018-06-17 LAB — PATHOLOGIST SMEAR REVIEW

## 2018-06-19 ENCOUNTER — Telehealth: Payer: Self-pay | Admitting: Family Medicine

## 2018-06-19 ENCOUNTER — Other Ambulatory Visit: Payer: Self-pay | Admitting: Family Medicine

## 2018-06-19 ENCOUNTER — Other Ambulatory Visit: Payer: Self-pay | Admitting: *Deleted

## 2018-06-19 DIAGNOSIS — D649 Anemia, unspecified: Secondary | ICD-10-CM

## 2018-06-19 MED ORDER — METOPROLOL SUCCINATE ER 50 MG PO TB24: ORAL_TABLET | ORAL | 0 refills | Status: DC

## 2018-06-19 NOTE — Telephone Encounter (Signed)
New referral placed.

## 2018-06-19 NOTE — Telephone Encounter (Signed)
Received a call from [facility] requesting a New GI referral using Anemia as the DX code. They had the patient set up for a Colonoscopy but now he need to been for an office visit so they are requesting a New GI referral. Please place Referral.

## 2018-06-21 NOTE — Telephone Encounter (Signed)
Thanks

## 2018-06-26 ENCOUNTER — Ambulatory Visit: Admit: 2018-06-26 | Payer: BLUE CROSS/BLUE SHIELD | Admitting: Gastroenterology

## 2018-06-26 SURGERY — COLONOSCOPY WITH PROPOFOL
Anesthesia: General

## 2018-07-01 ENCOUNTER — Ambulatory Visit (INDEPENDENT_AMBULATORY_CARE_PROVIDER_SITE_OTHER): Payer: BLUE CROSS/BLUE SHIELD | Admitting: Gastroenterology

## 2018-07-01 ENCOUNTER — Other Ambulatory Visit: Payer: Self-pay

## 2018-07-01 ENCOUNTER — Encounter: Payer: Self-pay | Admitting: Gastroenterology

## 2018-07-01 VITALS — BP 179/105 | HR 64 | Ht 70.0 in | Wt 209.6 lb

## 2018-07-01 DIAGNOSIS — D649 Anemia, unspecified: Secondary | ICD-10-CM

## 2018-07-01 NOTE — Progress Notes (Signed)
Jonathon Bellows MD, MRCP(U.K) [address]  [place]  [place], [place] [address]  Main: [phone]  Fax: [phone]   Gastroenterology Consultation  Referring Provider:     Ria Bush, MD Primary Care Physician:  Tonia Ghent, MD Primary Gastroenterologist:  Dr. Jonathon Bellows  Reason for Consultation:     Anemia         HPI:   Luis Burke is a 62 y.o. y/o male referred for consultation & management  by Dr. Tonia Ghent, MD.    Anemia first noted 3 weeks back 06/09/18 .B12 normal.   CBC Latest Ref Rng & Units 06/16/2018 06/09/2018 11/10/2013  WBC 4.0 - 10.5 K/uL 6.4 5.1 8.2  Hemoglobin 13.0 - 17.0 g/dL 10.8(L) 11.5(L) 14.4  Hematocrit 39.0 - 52.0 % 30.7(L) 32.9(L) 43.7  Platelets 150.0 - 400.0 K/uL 96.0(L) 103.0(L) 145.0(L)   Iron/TIBC/Ferritin/ %Sat    Component Value Date/Time   IRON 81 06/16/2018 0828   IRONPCTSAT 29.2 06/16/2018 0828   Rectal bleeding: no  Nose bleeds: no  Hematemesis or hemoptysis : no  Blood in urine : no   Dad died at 77 and had some form of cancer  Not had a colonoscopy . Not a smoker. Takes an ocasional motrin but nothing regularly.   No other issues he has to discuss. His BP is a bit elevated.Was taken off his HCTZ.   BP (!) 179/105   Pulse 64   Ht 5\' 10"  (1.778 m)   Wt 209 lb 9.6 oz (95.1 kg)   BMI 30.07 kg/m    Past Medical History:  Diagnosis Date  . Hyperlipidemia 03/2004  . Hypertension 1994  . Snores     Past Surgical History:  Procedure Laterality Date  . CATARACT EXTRACTION W/PHACO Right 03/04/2018   Procedure: CATARACT EXTRACTION PHACO AND INTRAOCULAR LENS PLACEMENT ([place])  RIGHT TORIC;  Surgeon: Leandrew Koyanagi, MD;  Location: [facility];  Service: Ophthalmology;  Laterality: Right;  Per [facility] no Toric Lens 1:45 5.3  . HERNIA REPAIR    . L Lap herniorraphy  04/2000  . Left wrist ganglionectomy      Prior to Admission medications   Medication Sig Start Date End Date Taking? Authorizing  Provider  amLODipine (NORVASC) 2.5 MG tablet Take 1 tablet (2.5 mg total) by mouth daily. 06/09/18  Yes Tonia Ghent, MD  metoprolol succinate (TOPROL-XL) 50 MG 24 hr tablet Take with or immediately following a meal. 06/19/18  Yes Tonia Ghent, MD  co-enzyme Q-10 50 MG capsule Take 50 mg by mouth daily.    [provider]  Protein POWD Take by mouth daily.    [provider]    Family History  Problem Relation Age of Onset  . Hypertension Mother   . Diabetes Mother   . Heart disease Mother        CAD  . Dementia Mother   . Hypertension Father   . Heart disease Father        MI 02/03  . Colon cancer Father   . Hypertension Sister   . Hypertension Sister   . Hypertension Sister   . Prostate cancer Neg Hx      Social History   Tobacco Use  . Smoking status: Current Some Day Smoker    Types: Cigars  . Smokeless tobacco: Never Used  . Tobacco comment: Occassionally  Substance Use Topics  . Alcohol use: Yes    Alcohol/week: 3.0 standard drinks  Types: 3 Cans of beer per week  . Drug use: No    Allergies as of 07/01/2018 - Review Complete 07/01/2018  Allergen Reaction Noted  . Tadalafil  02/14/2009    Review of Systems:    All systems reviewed and negative except where noted in HPI.   Physical Exam:  BP (!) 179/105   Pulse 64   Ht 5\' 10"  (1.778 m)   Wt 209 lb 9.6 oz (95.1 kg)   BMI 30.07 kg/m  No LMP for male patient. Psych:  Alert and cooperative. Normal mood and affect. General:   Alert,  Well-developed, well-nourished, pleasant and cooperative in NAD Head:  Normocephalic and atraumatic. Eyes:  Sclera clear, no icterus.   Conjunctiva pink. Ears:  Normal auditory acuity. Nose:  No deformity, discharge, or lesions. Mouth:  No deformity or lesions,oropharynx pink & moist. Neck:  Supple; no masses or thyromegaly. Lungs:  Respirations even and unlabored.  Clear throughout to auscultation.   No wheezes, crackles, or rhonchi. No acute  distress. Heart:  Regular rate and rhythm; no murmurs, clicks, rubs, or gallops. Abdomen:  Normal bowel sounds.  No bruits.  Soft, non-tender and non-distended without masses, hepatosplenomegaly or hernias noted.  No guarding or rebound tenderness.    Neurologic:  Alert and oriented x3;  grossly normal neurologically. Skin:  Intact without significant lesions or rashes. No jaundice. Lymph Nodes:  No significant cervical adenopathy. Psych:  Alert and cooperative. Normal mood and affect.  Imaging Studies: No results found.  Assessment and Plan:   Luis Burke is a 62 y.o. y/o male has been referred for anemia. Normocytic , new onset . CKD. Marland Kitchen Likely anemia of chronic disease.   Plan  1. EGD+colonoscopy  2. If above negative then needs to see Hematology to r/o issues with bone marrow  3. Discuss with Dr Damita Dunnings regarding elevated blood pressure- has on and off headaches , has CKD, ? Needs ACE inhibitor. Suggested low salt diet . He will contact Dr Damita Dunnings.   I have discussed alternative options, risks & benefits,  which include, but are not limited to, bleeding, infection, perforation,respiratory complication & drug reaction.  The patient agrees with this plan & written consent will be obtained.     Follow up PRN.   Dr Jonathon Bellows MD,MRCP(U.K)

## 2018-07-02 ENCOUNTER — Telehealth: Payer: Self-pay

## 2018-07-02 ENCOUNTER — Encounter: Payer: Self-pay | Admitting: Family Medicine

## 2018-07-02 ENCOUNTER — Ambulatory Visit: Payer: BLUE CROSS/BLUE SHIELD | Admitting: Family Medicine

## 2018-07-02 VITALS — BP 174/100 | HR 66 | Temp 98.1°F | Ht 70.0 in | Wt 208.2 lb

## 2018-07-02 DIAGNOSIS — R519 Headache, unspecified: Secondary | ICD-10-CM | POA: Insufficient documentation

## 2018-07-02 DIAGNOSIS — G44209 Tension-type headache, unspecified, not intractable: Secondary | ICD-10-CM

## 2018-07-02 DIAGNOSIS — R51 Headache: Secondary | ICD-10-CM

## 2018-07-02 DIAGNOSIS — I10 Essential (primary) hypertension: Secondary | ICD-10-CM

## 2018-07-02 LAB — URINALYSIS, ROUTINE W REFLEX MICROSCOPIC
Bilirubin, UA: NEGATIVE
Glucose, UA: NEGATIVE
KETONES UA: NEGATIVE
LEUKOCYTES UA: NEGATIVE
Nitrite, UA: NEGATIVE
PH UA: 5.5 (ref 5.0–7.5)
RBC, UA: NEGATIVE
SPEC GRAV UA: 1.013 (ref 1.005–1.030)
Urobilinogen, Ur: 0.2 mg/dL (ref 0.2–1.0)

## 2018-07-02 LAB — IRON,TIBC AND FERRITIN PANEL
Ferritin: 357 ng/mL (ref 30–400)
IRON SATURATION: 30 % (ref 15–55)
IRON: 74 ug/dL (ref 38–169)
TIBC: 250 ug/dL (ref 250–450)
UIBC: 176 ug/dL (ref 111–343)

## 2018-07-02 LAB — RETICULOCYTES: Retic Ct Pct: 3.3 % — ABNORMAL HIGH (ref 0.6–2.6)

## 2018-07-02 LAB — FOLATE: Folate: 12 ng/mL (ref >3.0)

## 2018-07-02 MED ORDER — AMLODIPINE BESYLATE 5 MG PO TABS: 5.0000 mg | ORAL_TABLET | Freq: Every day | ORAL | 0 refills | Status: DC

## 2018-07-02 NOTE — Telephone Encounter (Signed)
Pt walked in; last night had pounding h/a with dizziness; yesterday at GI office BP 178/105.this AM H/A pain level 4 and slight dizziness; tylenol helped h/a. Pt has not missed BP meds. [facility] CMA is ready to take pt back so she will get vitals. FYI to Dr Glori Bickers.

## 2018-07-02 NOTE — Assessment & Plan Note (Signed)
This may be from elevated bp or possibly elevating it  Reassuring neuro exam  On metoprolol-continue (will not inc due to pulse of 66) Inc amlodipine to 5 mg daily  Acetaminophen prn / fluids  F/u with pcp on Monday   inst to call/seek care if inc in HA intensity or any neuro symptoms

## 2018-07-02 NOTE — Telephone Encounter (Signed)
Seen in office.

## 2018-07-02 NOTE — Progress Notes (Signed)
Subjective:    Patient ID: Luis Burke, male    DOB: 1956-06-27, 62 y.o.   MRN: [id number]  HPI 62 yo pt of Dr Damita Dunnings with HTN and hyperlipidemia here with bp issues  He takes amlodipine 2.5 mg daily  Metoprolol xl 50 mg daily  Was prev on maxzide    He occasionally smokes cigars (none today)   Wt Readings from Last 3 Encounters:  07/02/18 208 lb 4 oz (94.5 kg)  07/01/18 209 lb 9.6 oz (95.1 kg)  06/09/18 204 lb 12 oz (92.9 kg)   29.88 kg/m   BP Readings from Last 3 Encounters:  07/02/18 (!) 174/100  07/01/18 (!) 179/105  06/09/18 (!) 148/102  was pre screening for a colonoscopy yesterday   Pulse Readings from Last 3 Encounters:  07/02/18 66  07/01/18 64  06/09/18 74     He has an appt with Dr Damita Dunnings on Monday   Woke up with a bad headache last night- 1 am  Whole head  Throbbing  No n/v no uri symptoms No photo/phono phobia  Not a "thunderclap" headache  That is unusual for him  He had felt a little dizzy at work yesterday (that is resolved)   Today -very mild headache remains (and fatigue from no sleep) No dizziness  No facial droop or other neuro symptoms   Drinks lots of water  He eats a lot of processed foods -just changed that (planning a lifestyle change)  Also goes to the gym and does cardio (but work has kept him from it for 2 weeks)   No recent nsaids or otc cold medicines   No change in stress    Patient Active Problem List   Diagnosis Date Noted  . Headache 07/02/2018  . Snoring 06/10/2018  . FH: colon cancer 06/10/2018  . Advance care planning 11/24/2015  . Routine general medical examination at a health care facility 04/29/2012  . ESSENTIAL HYPERTENSION, BENIGN 01/20/2008  . HYPERLIPIDEMIA, WITH LOW HDL 06/02/2007  . 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 Hyperlipidemia 03/2004  . Hypertension 1994  . Snores    Past Surgical History:  Procedure Laterality Date  . CATARACT  EXTRACTION W/PHACO Right 03/04/2018   Procedure: CATARACT EXTRACTION PHACO AND INTRAOCULAR LENS PLACEMENT ([place])  RIGHT TORIC;  Surgeon: Leandrew Koyanagi, MD;  Location: [facility];  Service: Ophthalmology;  Laterality: Right;  Per [facility] no Toric Lens 1:45 5.3  . HERNIA REPAIR    . L Lap herniorraphy  04/2000  . Left wrist ganglionectomy     Social History   Tobacco Use  . Smoking status: Current Some Day Smoker    Types: Cigars  . Smokeless tobacco: Never Used  . Tobacco comment: Occassionally  Substance Use Topics  . Alcohol use: Yes    Alcohol/week: 3.0 standard drinks    Types: 3 Cans of beer per week  . Drug use: No   Family History  Problem Relation Age of Onset  . Hypertension Mother   . Diabetes Mother   . Heart disease Mother        CAD  . Dementia Mother   . Hypertension Father   . Heart disease Father        MI 02/03  . Colon cancer Father   . Hypertension Sister   . Hypertension Sister   . Hypertension Sister   . Prostate cancer Neg Hx    Allergies  Allergen Reactions  .  Tadalafil     Intolerant,  Didn't feel well on med   Current Outpatient Medications on File Prior to Visit  Medication Sig Dispense Refill  . co-enzyme Q-10 50 MG capsule Take 50 mg by mouth daily.    . metoprolol succinate (TOPROL-XL) 50 MG 24 hr tablet Take with or immediately following a meal. 90 tablet 0   No current facility-administered medications on file prior to visit.     Review of Systems  Constitutional: Positive for fatigue. Negative for activity change, appetite change, fever and unexpected weight change.       Did not sleep well last night   HENT: Negative for congestion, rhinorrhea, sore throat and trouble swallowing.   Eyes: Negative for pain, redness, itching and visual disturbance.  Respiratory: Negative for cough, chest tightness, shortness of breath and wheezing.   Cardiovascular: Negative for chest pain and palpitations.  Gastrointestinal: Negative for  abdominal pain, blood in stool, constipation, diarrhea and nausea.  Endocrine: Negative for cold intolerance, heat intolerance, polydipsia and polyuria.  Genitourinary: Negative for difficulty urinating, dysuria, frequency and urgency.  Musculoskeletal: Negative for arthralgias, joint swelling and myalgias.  Skin: Negative for pallor and rash.  Neurological: Positive for headaches. Negative for dizziness, tremors, seizures, syncope, facial asymmetry, speech difficulty, weakness, light-headedness and numbness.       Was briefly dizzy yesterday -no longer today  Headache is very mild this am   Hematological: Negative for adenopathy. Does not bruise/bleed easily.  Psychiatric/Behavioral: Negative for decreased concentration and dysphoric mood. The patient is not nervous/anxious.        Objective:   Physical Exam  Constitutional: He appears well-developed and well-nourished. No distress.  overwt and well appearing   HENT:  Head: Normocephalic and atraumatic.  Nose: Nose normal.  Mouth/Throat: Oropharynx is clear and moist.  Nares are clear  Eyes: Pupils are equal, round, and reactive to light. Conjunctivae and EOM are normal. Right eye exhibits no discharge. Left eye exhibits no discharge. No scleral icterus.  Neck: Normal range of motion. Neck supple. No JVD present. Carotid bruit is not present. No thyromegaly present.  Cardiovascular: Normal rate, regular rhythm, normal heart sounds and intact distal pulses. Exam reveals no gallop.  No murmur heard. Pulmonary/Chest: Effort normal and breath sounds normal. No respiratory distress. He has no wheezes. He has no rales.  No crackles  Good air exch   Abdominal: Soft. Bowel sounds are normal. He exhibits no distension, no abdominal bruit and no mass. There is no tenderness.  Musculoskeletal: He exhibits no edema.  Lymphadenopathy:    He has no cervical adenopathy.  Neurological: He is alert. He has normal reflexes. He displays no atrophy,  no tremor and normal reflexes. No cranial nerve deficit. He exhibits normal muscle tone. Coordination and gait normal.  Neg rhomberg   Skin: Skin is warm and dry. No rash noted.  Psychiatric: He has a normal mood and affect.  Pleasant           Assessment & Plan:   Problem List Items Addressed This Visit      Cardiovascular and Mediastinum   ESSENTIAL HYPERTENSION, BENIGN - Primary    bp medication was reduced due to ? Hypotensive episodes in the past  Now last 3 bp have been elevated BP Readings from Last 3 Encounters:  07/02/18 (!) 174/100  07/01/18 (!) 179/105  06/09/18 (!) 148/102   Has headache/ occ mild dizziness  Will inc amlodipine back to 5 mg daily (alert if side eff  or symptoms of low bp)  Continue metoprolol F/u with Dr Damita Dunnings on Monday as planned He was prev on maxzide - this may be an option if he needs further bp reduction  Disc diet/exercise/wt loss /lifestyle habits DASH eating  Alert if no imp in bp or HA (or worse)       Relevant Medications   amLODipine (NORVASC) 5 MG tablet     Other   Headache    This may be from elevated bp or possibly elevating it  Reassuring neuro exam  On metoprolol-continue (will not inc due to pulse of 66) Inc amlodipine to 5 mg daily  Acetaminophen prn / fluids  F/u with pcp on Monday   inst to call/seek care if inc in HA intensity or any neuro symptoms       Relevant Medications   amLODipine (NORVASC) 5 MG tablet

## 2018-07-02 NOTE — Patient Instructions (Addendum)
Amlodipine 5 mg - take 1 whole pill daily   You can take another 2.5 mg this am and then a whole one tonight If side effects or problems let us know  Drink lots of water  Tylenol as needed for headache   Avoid processed foods   Follow up with Dr Damita Dunnings on Monday as planned   If symptoms worsen let us know

## 2018-07-02 NOTE — Assessment & Plan Note (Signed)
bp medication was reduced due to ? Hypotensive episodes in the past  Now last 3 bp have been elevated BP Readings from Last 3 Encounters:  07/02/18 (!) 174/100  07/01/18 (!) 179/105  06/09/18 (!) 148/102   Has headache/ occ mild dizziness  Will inc amlodipine back to 5 mg daily (alert if side eff or symptoms of low bp)  Continue metoprolol F/u with Dr Damita Dunnings on Monday as planned He was prev on maxzide - this may be an option if he needs further bp reduction  Disc diet/exercise/wt loss /lifestyle habits DASH eating  Alert if no imp in bp or HA (or worse)

## 2018-07-06 ENCOUNTER — Telehealth: Payer: Self-pay

## 2018-07-06 ENCOUNTER — Encounter: Payer: Self-pay | Admitting: Family Medicine

## 2018-07-06 ENCOUNTER — Ambulatory Visit: Payer: BLUE CROSS/BLUE SHIELD | Admitting: Family Medicine

## 2018-07-06 VITALS — BP 142/84 | HR 66 | Temp 98.0°F | Ht 70.0 in | Wt 203.8 lb

## 2018-07-06 DIAGNOSIS — R51 Headache: Secondary | ICD-10-CM

## 2018-07-06 DIAGNOSIS — R7989 Other specified abnormal findings of blood chemistry: Secondary | ICD-10-CM

## 2018-07-06 DIAGNOSIS — IMO0002 Reserved for concepts with insufficient information to code with codable children: Secondary | ICD-10-CM | POA: Insufficient documentation

## 2018-07-06 DIAGNOSIS — I10 Essential (primary) hypertension: Secondary | ICD-10-CM | POA: Diagnosis not present

## 2018-07-06 DIAGNOSIS — R519 Headache, unspecified: Secondary | ICD-10-CM

## 2018-07-06 DIAGNOSIS — D649 Anemia, unspecified: Secondary | ICD-10-CM

## 2018-07-06 LAB — CBC WITH DIFFERENTIAL/PLATELET
BASOS PCT: 0.4 % (ref 0.0–3.0)
Basophils Absolute: 0 10*3/uL (ref 0.0–0.1)
EOS PCT: 1.9 % (ref 0.0–5.0)
Eosinophils Absolute: 0.1 10*3/uL (ref 0.0–0.7)
HEMATOCRIT: 31.5 % — AB (ref 39.0–52.0)
Hemoglobin: 11 g/dL — ABNORMAL LOW (ref 13.0–17.0)
LYMPHS ABS: 2.1 10*3/uL (ref 0.7–4.0)
LYMPHS PCT: 35.7 % (ref 12.0–46.0)
MCHC: 34.9 g/dL (ref 30.0–36.0)
MCV: 95.8 fl (ref 78.0–100.0)
MONOS PCT: 8.9 % (ref 3.0–12.0)
Monocytes Absolute: 0.5 10*3/uL (ref 0.1–1.0)
NEUTROS ABS: 3.1 10*3/uL (ref 1.4–7.7)
NEUTROS PCT: 53.1 % (ref 43.0–77.0)
PLATELETS: 100 10*3/uL — AB (ref 150.0–400.0)
RBC: 3.29 Mil/uL — ABNORMAL LOW (ref 4.22–5.81)
RDW: 15.5 % (ref 11.5–15.5)
WBC: 5.9 10*3/uL (ref 4.0–10.5)

## 2018-07-06 LAB — BASIC METABOLIC PANEL
BUN: 25 mg/dL — AB (ref 6–23)
CO2: 28 meq/L (ref 19–32)
CREATININE: 1.53 mg/dL — AB (ref 0.40–1.50)
Calcium: 10.2 mg/dL (ref 8.4–10.5)
Chloride: 106 mEq/L (ref 96–112)
GFR: 49.2 mL/min — ABNORMAL LOW (ref >60.00)
Glucose, Bld: 120 mg/dL — ABNORMAL HIGH (ref 70–99)
Potassium: 4.7 mEq/L (ref 3.5–5.1)
Sodium: 141 mEq/L (ref 135–145)

## 2018-07-06 NOTE — Telephone Encounter (Signed)
-----   Message from Jonathon Bellows, MD sent at 07/05/2018  4:05 PM EDT ----- Luis Burke   Inform iron studies are normal, normal urine, folate.Proceed with EGD/colonoscopy and if negative and if issue persists Tonia Ghent, MD can consult Hematology at that point.    C/c Tonia Ghent, MD

## 2018-07-06 NOTE — Assessment & Plan Note (Addendum)
Controlled, no change in meds.  D/w pt.  He agrees.

## 2018-07-06 NOTE — Telephone Encounter (Signed)
Spoke with pt and informed him of lab results and Dr. Georgeann Oppenheim instructions to proceed with EGD/Colonoscopy.

## 2018-07-06 NOTE — Assessment & Plan Note (Signed)
Recheck cbc.  Refer to heme if GI w/u unremarkable.  D/w pt.  He agrees.

## 2018-07-06 NOTE — Patient Instructions (Signed)
Don't change your meds for now.  Avoid ibuprofen and aleve.  Tylenol is okay to take if needed for headaches.   Go to the lab on the way out.  We'll contact you with your lab report. We'll address your kidney test if still abnormal and your blood counts if needed.  You may need to see the blood clinic after seeing the GI clinic.  Take care.  Glad to see you.

## 2018-07-06 NOTE — Assessment & Plan Note (Signed)
Now resolved.  

## 2018-07-06 NOTE — Progress Notes (Signed)
Hypertension:    Using medication without problems or lightheadedness: yes Chest pain with exertion:no Edema:no Short of breath:no He is off triamterene/hctz and is on higher dose of amlodipine.  His HAs are resolved.    Cr elevation d/w pt.  He is drinking plenty of fluids/water.  No nsaids, only rare ibuprofen prev.  Taking tylenol as needed.   Recent u/a with trace protein.  Repeat bmet pending.    Anemia.  Has seen GI.  He has EGD and colonoscopy pending.  No blood in stool.  No black stools.    No FCNAVD.  No night sweats.  He has intentional weight loss.  No rash.   Prev GI notes and recent labs d/w pt, with inc retic count.    PMH and SH reviewed ROS: Per HPI unless specifically indicated in ROS section   Meds, vitals, and allergies reviewed.   GEN: nad, alert and oriented HEENT: mucous membranes moist NECK: supple w/o LA CV: rrr. PULM: ctab, no inc wob ABD: soft, +bs EXT: no edema SKIN: no acute rash

## 2018-07-06 NOTE — Assessment & Plan Note (Signed)
Recheck today.  Off triamterene/hctz.  Avoid nsaids.  D/w pt.  He agrees.  See notes on labs.  >25 minutes spent in face to face time with patient, >50% spent in counselling or coordination of care.

## 2018-07-13 ENCOUNTER — Telehealth: Payer: Self-pay

## 2018-07-13 NOTE — Telephone Encounter (Signed)
Noted. Thanks.

## 2018-07-13 NOTE — Telephone Encounter (Signed)
-----   Message from Tonia Ghent, MD sent at 06/21/2018 10:31 PM EDT ----- Thank you for checking on all of this.  Please let me know what you hear from the patient when you follow-up with him.  I appreciate all of your effort.  Brigitte Pulse  ----- Message ----- From: Diamond Nickel, RN Sent: 06/18/2018   4:50 PM EDT To: Tonia Ghent, MD  Okay, so I was able to speak with the patient.  Unfortunately, his insurance company does not cover the sleep study period.  Regardless of provider, dx or level of need.   Patient discussed this with others within his company and they have had the same issues.  They suggested that he get something to raise the head of his bed and found that helped them more than anything else.   Patient says he can afford the out of pocket expense but he has had several other expenses this year and does not wish to pursue it further at this time.  He is going to look at altering his bed to see if that provides some improvement.    I was able to speak with [facility] to ask options they can provide. So, if the patient's insurance just does not offer coverage for the service, he will still qualify for the 55% discount that [facility] offers for self pay expense.  We can refer him to them and he will consult with their providers first (consult fee is $200.00) and they will set up the necessary testing at his home Testing fee ($525.00) and he does qualify for the 55% off fee on both of those.  I did not have this information before when I spoke with the patient, so I will follow up with him and let you know if this may be of interest to him.  This is likely his best option if he chooses to proceed.  Let me know if you have any additional thoughts or concerns.   Mandy ----- Message ----- From: Tonia Ghent, MD Sent: 06/18/2018   9:20 AM EDT To: Tonia Ghent, MD, Diamond Nickel, RN  I cannot remember if I sent you a note about this.  Please talk to me about this  patient. His insurance company and they will not pay for a sleep study nor sleep apnea machine or supplies.   Thanks.   Brigitte Pulse

## 2018-07-13 NOTE — Telephone Encounter (Signed)
Spoke with patient.  He has been made aware of the discounts in regards to the sleep study/provider referral.  At this time, he has lost weight and his blood pressure is down.  His girlfriend states that he is not snoring as much through the night and is showing improvement.  At this time, he is still not interested in pursuing the sleep study.  But he appreciates the additional information and will let me know if he changes his mind in the future.  Right now, patient is dealing with some "bleeding issues" and will be following up with GI for a colonoscopy and endoscopy on Monday.  Dr. Damita Dunnings is aware and following patient closely.

## 2018-07-17 ENCOUNTER — Encounter: Payer: Self-pay | Admitting: *Deleted

## 2018-07-20 ENCOUNTER — Ambulatory Visit: Payer: BLUE CROSS/BLUE SHIELD | Admitting: Anesthesiology

## 2018-07-20 ENCOUNTER — Ambulatory Visit
Admission: RE | Admit: 2018-07-20 | Discharge: 2018-07-20 | Disposition: A | Discharge status: Home / Self Care | Payer: BLUE CROSS/BLUE SHIELD | Source: Ambulatory Visit | Attending: Gastroenterology | Admitting: Gastroenterology

## 2018-07-20 ENCOUNTER — Encounter
Admission: RE | Disposition: A | Discharge status: Home / Self Care | Payer: Self-pay | Source: Ambulatory Visit | Attending: Gastroenterology

## 2018-07-20 ENCOUNTER — Encounter: Payer: Self-pay | Admitting: *Deleted

## 2018-07-20 DIAGNOSIS — K210 Gastro-esophageal reflux disease with esophagitis: Secondary | ICD-10-CM | POA: Diagnosis not present

## 2018-07-20 DIAGNOSIS — I10 Essential (primary) hypertension: Secondary | ICD-10-CM | POA: Insufficient documentation

## 2018-07-20 DIAGNOSIS — D500 Iron deficiency anemia secondary to blood loss (chronic): Secondary | ICD-10-CM | POA: Diagnosis not present

## 2018-07-20 DIAGNOSIS — Z79899 Other long term (current) drug therapy: Secondary | ICD-10-CM | POA: Insufficient documentation

## 2018-07-20 DIAGNOSIS — K635 Polyp of colon: Secondary | ICD-10-CM | POA: Diagnosis not present

## 2018-07-20 DIAGNOSIS — K2980 Duodenitis without bleeding: Secondary | ICD-10-CM | POA: Diagnosis not present

## 2018-07-20 DIAGNOSIS — D122 Benign neoplasm of ascending colon: Secondary | ICD-10-CM | POA: Insufficient documentation

## 2018-07-20 DIAGNOSIS — D649 Anemia, unspecified: Secondary | ICD-10-CM

## 2018-07-20 DIAGNOSIS — E785 Hyperlipidemia, unspecified: Secondary | ICD-10-CM | POA: Insufficient documentation

## 2018-07-20 DIAGNOSIS — D126 Benign neoplasm of colon, unspecified: Secondary | ICD-10-CM | POA: Diagnosis not present

## 2018-07-20 HISTORY — PX: ESOPHAGOGASTRODUODENOSCOPY (EGD) WITH PROPOFOL: SHX5813

## 2018-07-20 HISTORY — PX: COLONOSCOPY WITH PROPOFOL: SHX5780

## 2018-07-20 SURGERY — COLONOSCOPY WITH PROPOFOL
Anesthesia: General

## 2018-07-20 MED ORDER — PHENYLEPHRINE HCL 10 MG/ML IJ SOLN
INTRAMUSCULAR | Status: DC | PRN
  Administered 2018-07-20: 100 ug via INTRAVENOUS

## 2018-07-20 MED ORDER — LIDOCAINE HCL (PF) 2 % IJ SOLN
INTRAMUSCULAR | Status: DC | PRN
  Administered 2018-07-20: 100 mg via INTRADERMAL

## 2018-07-20 MED ORDER — PROPOFOL 10 MG/ML IV BOLUS
INTRAVENOUS | Status: DC | PRN
  Administered 2018-07-20: 80 mg via INTRAVENOUS
  Administered 2018-07-20: 20 mg via INTRAVENOUS

## 2018-07-20 MED ORDER — PROPOFOL 500 MG/50ML IV EMUL
INTRAVENOUS | Status: DC | PRN
  Administered 2018-07-20: 150 ug/kg/min via INTRAVENOUS

## 2018-07-20 MED ORDER — SODIUM CHLORIDE 0.9 % IV SOLN
INTRAVENOUS | Status: DC
  Administered 2018-07-20: 10:00:00 via INTRAVENOUS

## 2018-07-20 NOTE — Transfer of Care (Signed)
Immediate Anesthesia Transfer of Care Note  Patient: DAIVON RAYOS  Procedure(s) Performed: COLONOSCOPY WITH PROPOFOL (N/A ) ESOPHAGOGASTRODUODENOSCOPY (EGD) WITH PROPOFOL (N/A )  Patient Location: PACU  Anesthesia Type:General  Level of Consciousness: sedated  Airway & Oxygen Therapy: Patient Spontanous Breathing and Patient connected to nasal cannula oxygen  Post-op Assessment: Report given to RN and Post -op Vital signs reviewed and stable  Post vital signs: Reviewed and stable  Last Vitals:  Vitals Value Taken Time  BP 117/75 07/20/2018 10:11 AM  Temp 36.1 C 07/20/2018 10:11 AM  Pulse 78 07/20/2018 10:12 AM  Resp 15 07/20/2018 10:12 AM  SpO2 97 % 07/20/2018 10:12 AM  Vitals shown include unvalidated device data.  Last Pain:  Vitals:   07/20/18 1011  TempSrc: Tympanic  PainSc: 0-No pain         Complications: No apparent anesthesia complications

## 2018-07-20 NOTE — Op Note (Signed)
[facility] Gastroenterology Patient Name: Luis Burke Procedure Date: 07/20/2018 9:34 AM MRN: [id number] Account #: [id number] Date of Birth: Jan 12, 1956 Admit Type: Outpatient Age: 62 Room: [facility] ENDO ROOM 2 Gender: Male Note Status: Finalized Procedure:            Upper GI endoscopy Indications:          Iron deficiency anemia secondary to chronic blood loss Providers:            Jonathon Bellows MD, MD Referring MD:         Elveria Rising. Damita Dunnings, MD (Referring MD) Medicines:            Monitored Anesthesia Care Complications:        No immediate complications. Procedure:            Pre-Anesthesia Assessment:                       - Prior to the procedure, a History and Physical was                        performed, and patient medications, allergies and                        sensitivities were reviewed. The patient's tolerance of                        previous anesthesia was reviewed.                       - The risks and benefits of the procedure and the                        sedation options and risks were discussed with the                        patient. All questions were answered and informed                        consent was obtained.                       - ASA Grade Assessment: II - A patient with mild                        systemic disease.                       After obtaining informed consent, the endoscope was                        passed under direct vision. Throughout the procedure,                        the patient's blood pressure, pulse, and oxygen                        saturations were monitored continuously. The Endoscope                        was introduced through the mouth, and advanced to the  third part of duodenum. The upper GI endoscopy was                        accomplished with ease. The patient tolerated the                        procedure well. Findings:      The stomach was normal.      The cardia and  gastric fundus were normal on retroflexion.      The examined duodenum was normal. Biopsies for histology were taken with       a cold forceps for evaluation of celiac disease.      LA Grade A (one or more mucosal breaks less than 5 mm, not extending       between tops of 2 mucosal folds) esophagitis with no bleeding was found       at the gastroesophageal junction. Biopsies were taken with a cold       forceps for histology. Impression:           - Normal stomach.                       - Normal examined duodenum. Biopsied.                       - LA Grade A esophagitis. Biopsied. Recommendation:       - Await pathology results.                       - Patient has a contact number available for                        emergencies. The signs and symptoms of potential                        delayed complications were discussed with the patient.                        Return to normal activities tomorrow. Written discharge                        instructions were provided to the patient.                       - Patient has a contact number available for                        emergencies. The signs and symptoms of potential                        delayed complications were discussed with the patient.                        Return to normal activities tomorrow. Written discharge                        instructions were provided to the patient.                       - Perform a colonoscopy today. Procedure Code(s):    --- Professional ---  22482, Esophagogastroduodenoscopy, flexible, transoral;                        with biopsy, single or multiple Diagnosis Code(s):    --- Professional ---                       K20.9, Esophagitis, unspecified                       D50.0, Iron deficiency anemia secondary to blood loss                        (chronic) CPT copyright 2017 American Medical Association. All rights reserved. The codes documented in this report are preliminary  and upon coder review may  be revised to meet current compliance requirements. Jonathon Bellows, MD Jonathon Bellows MD, MD 07/20/2018 9:50:09 AM This report has been signed electronically. Number of Addenda: 0 Note Initiated On: 07/20/2018 9:34 AM      [facility]

## 2018-07-20 NOTE — Anesthesia Post-op Follow-up Note (Signed)
Anesthesia QCDR form completed.        

## 2018-07-20 NOTE — Op Note (Signed)
[facility]enterology Patient Name: Luis Burke Procedure Date: 07/20/2018 9:33 AM MRN: [id number] Account #: [id number] Date of Birth: March 25, 1956 Admit Type: Outpatient Age: 62 Room: [facility] ENDO ROOM 2 Gender: Male Note Status: Finalized Procedure:            Colonoscopy Indications:          Iron deficiency anemia secondary to chronic blood loss Providers:            Jonathon Bellows MD, MD Referring MD:         Elveria Rising. Damita Dunnings, MD (Referring MD) Medicines:            Monitored Anesthesia Care Complications:        No immediate complications. Procedure:            Pre-Anesthesia Assessment:                       - Prior to the procedure, a History and Physical was                        performed, and patient medications, allergies and                        sensitivities were reviewed. The patient's tolerance of                        previous anesthesia was reviewed.                       - The risks and benefits of the procedure and the                        sedation options and risks were discussed with the                        patient. All questions were answered and informed                        consent was obtained.                       - ASA Grade Assessment: II - A patient with mild                        systemic disease.                       After obtaining informed consent, the colonoscope was                        passed under direct vision. Throughout the procedure,                        the patient's blood pressure, pulse, and oxygen                        saturations were monitored continuously. The                        Colonoscope was introduced through the anus and  advanced to the the cecum, identified by the                        appendiceal orifice, IC valve and transillumination.                        The colonoscopy was performed with ease. The patient                        tolerated the procedure  well. The quality of the bowel                        preparation was good. Findings:      The perianal and digital rectal examinations were normal.      Two sessile polyps were found in the ascending colon. The polyps were 5       to 7 mm in size. These polyps were removed with a cold snare. Resection       and retrieval were complete.      The exam was otherwise without abnormality on direct and retroflexion       views. Impression:           - Two 5 to 7 mm polyps in the ascending colon, removed                        with a cold snare. Resected and retrieved.                       - The examination was otherwise normal on direct and                        retroflexion views. Recommendation:       - Discharge patient to home (with escort).                       - Resume previous diet.                       - Continue present medications.                       - Await pathology results.                       - Repeat colonoscopy in 5-10 years for surveillance                        based on pathology results.                       - 1. Refer to hematology for normocytic anemia with low                        platelet count                       2. USG abdomen to evaluate spleen and liver as he has a                        history of increased alcohol intake in the past Procedure Code(s):    --- Professional ---  45385, Colonoscopy, flexible; with removal of tumor(s),                        polyp(s), or other lesion(s) by snare technique Diagnosis Code(s):    --- Professional ---                       D12.2, Benign neoplasm of ascending colon                       D50.0, Iron deficiency anemia secondary to blood loss                        (chronic) CPT copyright 2017 American Medical Association. All rights reserved. The codes documented in this report are preliminary and upon coder review may  be revised to meet current compliance requirements. Jonathon Bellows,  MD Jonathon Bellows MD, MD 07/20/2018 10:10:15 AM This report has been signed electronically. Number of Addenda: 0 Note Initiated On: 07/20/2018 9:33 AM Scope Withdrawal Time: 0 hours 12 minutes 37 seconds  Total Procedure Duration: 0 hours 14 minutes 54 seconds       [facility]

## 2018-07-20 NOTE — Anesthesia Procedure Notes (Signed)
Date/Time: 07/20/2018 9:46 AM Performed by: Nelda Marseille, CRNA Pre-anesthesia Checklist: Patient identified, Emergency Drugs available, Suction available, Patient being monitored and Timeout performed Oxygen Delivery Method: Nasal cannula

## 2018-07-20 NOTE — H&P (Signed)
Luis Bellows, MD [address], [place], [place], [place], [address] [address], [place], [place], [place], [address] Phone: [phone]  Fax: [phone]  Primary Care Physician:  Luis Ghent, MD   Pre-Procedure History & Physical: HPI:  Luis Burke is a 62 y.o. male is here for an endoscopy and colonoscopy    Past Medical History:  Diagnosis Date  . Hyperlipidemia 03/2004  . Hypertension 1994  . Snores     Past Surgical History:  Procedure Laterality Date  . CATARACT EXTRACTION W/PHACO Right 03/04/2018   Procedure: CATARACT EXTRACTION PHACO AND INTRAOCULAR LENS PLACEMENT ([place])  RIGHT TORIC;  Surgeon: Luis Koyanagi, MD;  Location: [facility];  Service: Ophthalmology;  Laterality: Right;  Per [facility] no Toric Lens 1:45 5.3  . EYE SURGERY Right   . HERNIA REPAIR    . L Lap herniorraphy  04/2000  . Left wrist ganglionectomy      Prior to Admission medications   Medication Sig Start Date End Date Taking? Authorizing Provider  amLODipine (NORVASC) 5 MG tablet Take 1 tablet (5 mg total) by mouth daily. 07/02/18  Yes Burke, Luis Fanny, MD  co-enzyme Q-10 50 MG capsule Take 50 mg by mouth daily.   Yes [provider]  metoprolol succinate (TOPROL-XL) 50 MG 24 hr tablet Take with or immediately following a meal. 06/19/18  Yes Luis Ghent, MD    Allergies as of 07/01/2018 - Review Complete 07/01/2018  Allergen Reaction Noted  . Tadalafil  02/14/2009    Family History  Problem Relation Age of Onset  . Hypertension Mother   . Diabetes Mother   . Heart disease Mother        CAD  . Dementia Mother   . Hypertension Father   . Heart disease Father        MI 02/03  . Colon cancer Father   . Hypertension Sister   . Hypertension Sister   . Hypertension Sister   . Prostate cancer Neg Hx     Social History   Socioeconomic History  . Marital status: Single    Spouse name: Not on file  . Number of children: 1  . Years of  education: Not on file  . Highest education level: Not on file  Occupational History  . Occupation: [employer]  Social Needs  . Financial resource strain: Not on file  . Food insecurity:    Worry: Not on file    Inability: Not on file  . Transportation needs:    Medical: Not on file    Non-medical: Not on file  Tobacco Use  . Smoking status: Never Smoker  . Smokeless tobacco: Never Used  . Tobacco comment: Occassionally  Substance and Sexual Activity  . Alcohol use: Yes    Alcohol/week: 3.0 standard drinks    Types: 3 Cans of beer per week  . Drug use: No  . Sexual activity: Not on file  Lifestyle  . Physical activity:    Days per week: Not on file    Minutes per session: Not on file  . Stress: Not on file  Relationships  . Social connections:    Talks on phone: Not on file    Gets together: Not on file    Attends religious service: Not on file    Active member of club or organization: Not on file    Attends meetings of clubs or organizations: Not on file  Relationship status: Not on file  . Intimate partner violence:    Fear of current or ex partner: Not on file    Emotionally abused: Not on file    Physically abused: Not on file    Forced sexual activity: Not on file  Other Topics Concern  . Not on file  Social History Narrative   Divorced 20-Jan-2008, dating as of 01-19-18   His daughter died 4 days after giving birth to the patient's granddaughter   Has joint custody of his dead daughter's child   Works at [employer] is [employer]    Review of Systems: See HPI, otherwise negative ROS  Physical Exam: BP (!) 173/105   Pulse 78   Temp (!) 96.6 F (35.9 C) (Tympanic)   Resp 16   Ht 5\' 10"  (1.778 m)   Wt 92.1 kg   SpO2 100%   BMI 29.13 kg/m  General:   Alert,  pleasant and cooperative in NAD Head:  Normocephalic and atraumatic. Neck:  Supple; no masses or thyromegaly. Lungs:  Clear throughout to auscultation, normal respiratory effort.    Heart:  +S1,  +S2, Regular rate and rhythm, No edema. Abdomen:  Soft, nontender and nondistended. Normal bowel sounds, without guarding, and without rebound.   Neurologic:  Alert and  oriented x4;  grossly normal neurologically.  Impression/Plan: Luis Burke is here for an endoscopy and colonoscopy  to be performed for  evaluation of anemia     Risks, benefits, limitations, and alternatives regarding endoscopy have been reviewed with the patient.  Questions have been answered.  All parties agreeable.   Luis Bellows, MD  07/20/2018, 9:25 AM

## 2018-07-20 NOTE — Anesthesia Preprocedure Evaluation (Signed)
Anesthesia Evaluation  Patient identified by MRN, date of birth, ID band Patient awake    Reviewed: Allergy & Precautions, H&P , NPO status , Patient's Chart, lab work & pertinent test results  Airway Mallampati: III  TM Distance: >3 FB     Dental  (+) Teeth Intact   Pulmonary neg pulmonary ROS, neg COPD,           Cardiovascular hypertension, (-) angina(-) Past MI negative cardio ROS  (-) dysrhythmias      Neuro/Psych  Headaches, negative psych ROS   GI/Hepatic negative GI ROS, Neg liver ROS,   Endo/Other  negative endocrine ROS  Renal/GU negative Renal ROS  negative genitourinary   Musculoskeletal   Abdominal   Peds  Hematology negative hematology ROS (+) anemia ,   Anesthesia Other Findings Past Medical History: 03/2004: Hyperlipidemia 1994: Hypertension No date: Snores  Past Surgical History: 03/04/2018: CATARACT EXTRACTION W/PHACO; Right     Comment:  Procedure: CATARACT EXTRACTION PHACO AND INTRAOCULAR               LENS PLACEMENT ([place])  RIGHT TORIC;  Surgeon: Leandrew Koyanagi, MD;  Location: [facility];  Service:               Ophthalmology;  Laterality: Right;  Per [facility] no Toric               Lens 1:45 5.3 No date: EYE SURGERY; Right No date: HERNIA REPAIR 04/2000: L Lap herniorraphy No date: Left wrist ganglionectomy  BMI    Body Mass Index:  29.13 kg/m      Reproductive/Obstetrics negative OB ROS                             Anesthesia Physical Anesthesia Plan  ASA: II  Anesthesia Plan: General   Post-op Pain Management:    Induction:   PONV Risk Score and Plan: Propofol infusion and TIVA  Airway Management Planned:   Additional Equipment:   Intra-op Plan:   Post-operative Plan:   Informed Consent: I have reviewed the patients History and Physical, chart, labs and discussed the procedure including the risks, benefits and  alternatives for the proposed anesthesia with the patient or authorized representative who has indicated his/her understanding and acceptance.   Dental Advisory Given  Plan Discussed with: Anesthesiologist, CRNA and Surgeon  Anesthesia Plan Comments:         Anesthesia Quick Evaluation

## 2018-07-21 ENCOUNTER — Telehealth: Payer: Self-pay | Admitting: Gastroenterology

## 2018-07-21 ENCOUNTER — Other Ambulatory Visit: Payer: Self-pay

## 2018-07-21 ENCOUNTER — Telehealth: Payer: Self-pay

## 2018-07-21 DIAGNOSIS — D649 Anemia, unspecified: Secondary | ICD-10-CM

## 2018-07-21 NOTE — Telephone Encounter (Signed)
Patient called & left message on phone  answering machine wanting to discuss his results from yesterdays procedures.He is very concerned.

## 2018-07-21 NOTE — Telephone Encounter (Signed)
-----   Message from Jonathon Bellows, MD sent at 07/20/2018 10:15 AM EDT ----- Regarding: please arrange appointment  Luis Burke  Please arrange   1. USG abdomen to evaluate low platelet count-H/o alcohol excess for a few years 2. Refer to Dr Tasia Catchings hematology for normocytic anemia, normal iron studies, low platelet count and  Normal EGD+colon  ???MDS  C/c Dr Tasia Catchings   Regards    Dr Jonathon Bellows  Gastroenterology/Hepatology Pager: [phone]

## 2018-07-21 NOTE — Telephone Encounter (Signed)
Spoke with pt regarding scheduling abdominal ultrasound as instructed by Dr. Vicente Males. Pt is aware of ultrasound appointment date, time, and location. Pt is also aware of hematology referral.

## 2018-07-22 LAB — SURGICAL PATHOLOGY

## 2018-07-22 NOTE — Anesthesia Postprocedure Evaluation (Signed)
Anesthesia Post Note  Patient: Luis Burke  Procedure(s) Performed: COLONOSCOPY WITH PROPOFOL (N/A ) ESOPHAGOGASTRODUODENOSCOPY (EGD) WITH PROPOFOL (N/A )  Patient location during evaluation: PACU Anesthesia Type: General Level of consciousness: awake and alert Pain management: pain level controlled Vital Signs Assessment: post-procedure vital signs reviewed and stable Respiratory status: spontaneous breathing, nonlabored ventilation and respiratory function stable Cardiovascular status: blood pressure returned to baseline and stable Postop Assessment: no apparent nausea or vomiting Anesthetic complications: no     Last Vitals:  Vitals:   07/20/18 1011 07/20/18 1047  BP: 117/75 (!) 130/102  Pulse: 76   Resp: 18   Temp: (!) 36.1 C   SpO2:      Last Pain:  Vitals:   07/21/18 0831  TempSrc:   PainSc: 0-No pain                 Durenda Hurt

## 2018-07-27 ENCOUNTER — Inpatient Hospital Stay: Payer: BLUE CROSS/BLUE SHIELD | Attending: Oncology | Admitting: Oncology

## 2018-07-27 ENCOUNTER — Encounter: Payer: Self-pay | Admitting: Oncology

## 2018-07-27 ENCOUNTER — Encounter (INDEPENDENT_AMBULATORY_CARE_PROVIDER_SITE_OTHER): Payer: Self-pay

## 2018-07-27 ENCOUNTER — Telehealth: Payer: Self-pay | Admitting: *Deleted

## 2018-07-27 ENCOUNTER — Inpatient Hospital Stay: Payer: BLUE CROSS/BLUE SHIELD

## 2018-07-27 ENCOUNTER — Other Ambulatory Visit: Payer: Self-pay

## 2018-07-27 VITALS — BP 174/100 | Temp 96.8°F | Ht 70.0 in | Wt 200.8 lb

## 2018-07-27 DIAGNOSIS — N189 Chronic kidney disease, unspecified: Secondary | ICD-10-CM | POA: Diagnosis not present

## 2018-07-27 DIAGNOSIS — D696 Thrombocytopenia, unspecified: Secondary | ICD-10-CM | POA: Insufficient documentation

## 2018-07-27 DIAGNOSIS — E785 Hyperlipidemia, unspecified: Secondary | ICD-10-CM | POA: Insufficient documentation

## 2018-07-27 DIAGNOSIS — D649 Anemia, unspecified: Secondary | ICD-10-CM | POA: Diagnosis not present

## 2018-07-27 DIAGNOSIS — K760 Fatty (change of) liver, not elsewhere classified: Secondary | ICD-10-CM | POA: Insufficient documentation

## 2018-07-27 DIAGNOSIS — R5383 Other fatigue: Secondary | ICD-10-CM | POA: Diagnosis not present

## 2018-07-27 DIAGNOSIS — Z79899 Other long term (current) drug therapy: Secondary | ICD-10-CM | POA: Diagnosis not present

## 2018-07-27 DIAGNOSIS — I129 Hypertensive chronic kidney disease with stage 1 through stage 4 chronic kidney disease, or unspecified chronic kidney disease: Secondary | ICD-10-CM | POA: Diagnosis not present

## 2018-07-27 LAB — COMPREHENSIVE METABOLIC PANEL
ALT: 22 U/L (ref 0–44)
ANION GAP: 10 (ref 5–15)
AST: 22 U/L (ref 15–41)
Albumin: 4.9 g/dL (ref 3.5–5.0)
Alkaline Phosphatase: 56 U/L (ref 38–126)
BUN: 26 mg/dL — ABNORMAL HIGH (ref 8–23)
CHLORIDE: 107 mmol/L (ref 98–111)
CO2: 26 mmol/L (ref 22–32)
Calcium: 10.4 mg/dL — ABNORMAL HIGH (ref 8.9–10.3)
Creatinine, Ser: 1.51 mg/dL — ABNORMAL HIGH (ref 0.61–1.24)
GFR, EST AFRICAN AMERICAN: 55 mL/min — AB (ref >60)
GFR, EST NON AFRICAN AMERICAN: 48 mL/min — AB (ref >60)
Glucose, Bld: 105 mg/dL — ABNORMAL HIGH (ref 70–99)
Potassium: 4.3 mmol/L (ref 3.5–5.1)
SODIUM: 143 mmol/L (ref 135–145)
Total Bilirubin: 0.7 mg/dL (ref 0.3–1.2)
Total Protein: 7.7 g/dL (ref 6.5–8.1)

## 2018-07-27 LAB — CBC WITH DIFFERENTIAL/PLATELET
BASOS ABS: 0 10*3/uL (ref 0.0–0.1)
BASOS PCT: 1 %
EOS PCT: 1 %
Eosinophils Absolute: 0.1 10*3/uL (ref 0.0–0.5)
HEMATOCRIT: 30.2 % — AB (ref 39.0–52.0)
Hemoglobin: 10.1 g/dL — ABNORMAL LOW (ref 13.0–17.0)
Lymphocytes Relative: 34 %
Lymphs Abs: 1.5 10*3/uL (ref 0.7–4.0)
MCH: 32.1 pg (ref 26.0–34.0)
MCHC: 33.4 g/dL (ref 30.0–36.0)
MCV: 95.9 fL (ref 80.0–100.0)
MONO ABS: 0.4 10*3/uL (ref 0.1–1.0)
Monocytes Relative: 9 %
NEUTROS ABS: 2.3 10*3/uL (ref 1.7–7.7)
NRBC: 0 % (ref 0.0–0.2)
Neutrophils Relative %: 51 %
PLATELETS: 80 10*3/uL — AB (ref 150–400)
RBC: 3.15 MIL/uL — AB (ref 4.22–5.81)
RDW: 14.6 % (ref 11.5–15.5)
WBC: 4.4 10*3/uL (ref 4.0–10.5)

## 2018-07-27 LAB — TSH: TSH: 1.561 u[IU]/mL (ref 0.350–4.500)

## 2018-07-27 LAB — TECHNOLOGIST SMEAR REVIEW

## 2018-07-27 LAB — RETICULOCYTES
IMMATURE RETIC FRACT: 17.6 % — AB (ref 2.3–15.9)
RBC.: 3.15 MIL/uL — ABNORMAL LOW (ref 4.22–5.81)
Retic Count, Absolute: 73.1 10*3/uL (ref 19.0–186.0)
Retic Ct Pct: 2.3 % (ref 0.4–3.1)

## 2018-07-27 NOTE — Telephone Encounter (Signed)
Phoned the patient with information from Dr. Damita Dunnings and patient says ever since he was taken off the HCTZ, his BP's have been elevated 170'/100's and that the other MD's that he has seen has noted it as well.  Patient asks if what he has going on could be attributed to the high BP's?

## 2018-07-27 NOTE — Progress Notes (Addendum)
Hematology/Oncology Consult note [facility] [phone] [phone]   Patient Care Team: Tonia Ghent, MD as PCP - General (Family Medicine)  REFERRING PROVIDER: Napoleon Form CHIEF COMPLAINTS/REASON FOR VISIT:  Evaluation of anemia and thrombocytopenia  HISTORY OF PRESENTING ILLNESS:  Luis Burke is a  62 y.o.  male with PMH listed below who was referred to me for evaluation of anemia and thrombocytopenia Reviewed patient's recent labs, 07/06/2018 labs revealed anemia with hemoglobin of hemoglobin 11, platelet counts 100. Normal total white count and normal differential. Reviewed patient's previous labs, anemia is chronic onset , duration is since August 2019. In August 2019, he has a hemoglobin of 11.5 which decreased to 10.8 in September.  No aggravating or improving factors.  Associated signs and symptoms: Patient reports feeling fatigue.  Denies SOB with exertion.  Denies weight loss, easy bruising, hematochezia, hemoptysis, hematuria. Context: History of GI bleeding: Denies               History of Chronic kidney disease yes               History of autoimmune disease denies denies               History of hemolytic anemia.                Last colonoscopy: 07/20/2018 upper and lower endoscopy showed esophagitis, normal examined duodenum, 2 polyps removed in the ascending colon, resected and retrieved.  Otherwise normal.               He drinks alcohol, usually a few beers during the weekends.  Denies any smoking history.   Intended weight loss 3 pounds for the past 3 months.  Review of Systems  Constitutional: Negative for chills, fever, malaise/fatigue and weight loss.  HENT: Negative for nosebleeds and sore throat.   Eyes: Negative for double vision, photophobia and redness.  Respiratory: Negative for cough, shortness of breath and wheezing.   Cardiovascular: Negative for chest pain, palpitations and orthopnea.    Gastrointestinal: Negative for abdominal pain, blood in stool, nausea and vomiting.  Genitourinary: Negative for dysuria.  Musculoskeletal: Negative for back pain, myalgias and neck pain.  Skin: Negative for itching and rash.  Neurological: Negative for dizziness, tingling and tremors.  Endo/Heme/Allergies: Negative for environmental allergies. Does not bruise/bleed easily.  Psychiatric/Behavioral: Negative for depression.    MEDICAL HISTORY:  Past Medical History:  Diagnosis Date  . Hyperlipidemia 03/2004  . Hypertension 1994  . Snores     SURGICAL HISTORY: Past Surgical History:  Procedure Laterality Date  . CATARACT EXTRACTION W/PHACO Right 03/04/2018   Procedure: CATARACT EXTRACTION PHACO AND INTRAOCULAR LENS PLACEMENT ([place])  RIGHT TORIC;  Surgeon: Leandrew Koyanagi, MD;  Location: [place];  Service: Ophthalmology;  Laterality: Right;  Per [place] no Toric Lens 1:45 5.3  . COLONOSCOPY WITH PROPOFOL N/A 07/20/2018   Procedure: COLONOSCOPY WITH PROPOFOL;  Surgeon: Jonathon Bellows, MD;  Location[facility] ENDOSCOPY;  Service: Gastroenterology;  Laterality: N/A;  . ESOPHAGOGASTRODUODENOSCOPY (EGD) WITH PROPOFOL N/A 07/20/2018   Procedure: ESOPHAGOGASTRODUODENOSCOPY (EGD) WITH PROPOFOL;  Surgeon: Jonathon Bellows, MD;  Location: [facility] ENDOSCOPY;  Service: Gastroenterology;  Laterality: N/A;  . EYE SURGERY Right   . HERNIA REPAIR    . L Lap herniorraphy  04/2000  . Left wrist ganglionectomy      SOCIAL HISTORY: Social History   Socioeconomic History  . Marital status: Single    Spouse name: Not on file  . Number  of children: 1  . Years of education: Not on file  . Highest education level: Not on file  Occupational History  . Occupation: [employer]  Social Needs  . Financial resource strain: Not on file  . Food insecurity:    Worry: Not on file    Inability: Not on file  . Transportation needs:    Medical: Not on file    Non-medical: Not on file  Tobacco Use  . Smoking  status: Never Smoker  . Smokeless tobacco: Never Used  . Tobacco comment: Occassionally  Substance and Sexual Activity  . Alcohol use: Not Currently    Alcohol/week: 3.0 standard drinks    Types: 3 Cans of beer per week  . Drug use: No  . Sexual activity: Not on file  Lifestyle  . Physical activity:    Days per week: Not on file    Minutes per session: Not on file  . Stress: Not on file  Relationships  . Social connections:    Talks on phone: Not on file    Gets together: Not on file    Attends religious service: Not on file    Active member of club or organization: Not on file    Attends meetings of clubs or organizations: Not on file    Relationship status: Not on file  . Intimate partner violence:    Fear of current or ex partner: Not on file    Emotionally abused: Not on file    Physically abused: Not on file    Forced sexual activity: Not on file  Other Topics Concern  . Not on file  Social History Narrative   Divorced 17-Dec-2007, dating as of 12/16/2017   His daughter died 4 days after giving birth to the patient's granddaughter   Has joint custody of his dead daughter's child   Works at [employer] is [employer]    FAMILY HISTORY: Family History  Problem Relation Age of Onset  . Hypertension Mother   . Diabetes Mother   . Heart disease Mother        CAD  . Dementia Mother   . Hypertension Father   . Heart disease Father        MI 02/03  . Colon cancer Father   . Hypertension Sister   . Hypertension Sister   . Hypertension Sister   . Prostate cancer Neg Hx     ALLERGIES:  is allergic to tadalafil.  MEDICATIONS:  Current Outpatient Medications  Medication Sig Dispense Refill  . amLODipine (NORVASC) 5 MG tablet Take 1 tablet (5 mg total) by mouth daily. 1 tablet 0  . metoprolol succinate (TOPROL-XL) 50 MG 24 hr tablet Take with or immediately following a meal. 90 tablet 0  . co-enzyme Q-10 50 MG capsule Take 50 mg by mouth daily.     No current  facility-administered medications for this visit.      PHYSICAL EXAMINATION: ECOG PERFORMANCE STATUS: 0 - Asymptomatic Vitals:   07/27/18 0923  BP: (!) 174/100  Temp: (!) 96.8 F (36 C)   Filed Weights   07/27/18 0923  Weight: 200 lb 12.8 oz (91.1 kg)    Physical Exam  Constitutional: He is oriented to person, place, and time. No distress.  HENT:  Head: Normocephalic and atraumatic.  Mouth/Throat: Oropharynx is clear and moist.  Eyes: Pupils are equal, round, and reactive to light. EOM are normal. No scleral icterus.  Neck: Normal range of motion. Neck supple.  Cardiovascular: Normal rate,  regular rhythm and normal heart sounds.  Pulmonary/Chest: Effort normal. No respiratory distress. He has no wheezes.  Abdominal: Soft. Bowel sounds are normal. He exhibits no distension and no mass. There is no tenderness.  Musculoskeletal: Normal range of motion. He exhibits no edema or deformity.  Neurological: He is alert and oriented to person, place, and time. No cranial nerve deficit. Coordination normal.  Skin: Skin is warm and dry. No rash noted. No erythema.  Psychiatric: He has a normal mood and affect. His behavior is normal. Thought content normal.     LABORATORY DATA:  I have reviewed the data as listed Lab Results  Component Value Date   WBC 4.4 07/27/2018   HGB 10.1 (L) 07/27/2018   HCT 30.2 (L) 07/27/2018   MCV 95.9 07/27/2018   PLT 80 (L) 07/27/2018   Recent Labs    06/09/18 1104 06/16/18 0828 07/06/18 0851 07/27/18 1034  NA 141 142 141 143  K 4.2 4.2 4.7 4.3  CL 105 105 106 107  CO2 31 28 28 26   GLUCOSE 108* 116* 120* 105*  BUN 27* 28* 25* 26*  CREATININE 1.59* 1.59* 1.53* 1.51*  CALCIUM 10.3 10.0 10.2 10.4*  GFRNONAA  --   --   --  48*  GFRAA  --   --   --  55*  PROT 7.4 7.1  --  7.7  ALBUMIN 4.8 4.6  --  4.9  AST 22 19  --  22  ALT 28 23  --  22  ALKPHOS 51 55  --  56  BILITOT 0.6 0.4  --  0.7   Iron/TIBC/Ferritin/ %Sat    Component Value  Date/Time   IRON 74 07/01/2018 0944   TIBC 250 07/01/2018 0944   FERRITIN 357 07/01/2018 0944   IRONPCTSAT 30 07/01/2018 0944        ASSESSMENT & PLAN:  1. Anemia, unspecified type   2. Thrombocytopenia (HCC)   3. Other fatigue    Anemia: multifactorial with possible causes including chronic blood loss, hyper/hypothyroidism, nutritional deficiency, infection/chronic inflammation, hemolysis, underlying bone marrow disorders. Mostly likely due to CKD.  Will check CBC w differential, CMP, reticulocytes, , blood smear, TSH,  monoclonal gammopathy evaluation.   Recent work-up showed normal folate level, borderline B12 level.  Iron panel is not consistent with iron deficiency. Check MMA level. #For thrombocytopenia, will check hepatitis and HIV. He already has US abdomen scheduled.  Fatigue, can be secondary to anemia.  We will also check TSH.   Orders Placed This Encounter  Procedures  . CBC with Differential/Platelet    Standing Status:   Future    Number of Occurrences:   1    Standing Expiration Date:   07/28/2019  . Technologist smear review    Standing Status:   Future    Number of Occurrences:   1    Standing Expiration Date:   07/28/2019  . Comprehensive metabolic panel    Standing Status:   Future    Number of Occurrences:   1    Standing Expiration Date:   07/28/2019  . Multiple Myeloma Panel (SPEP&IFE w/QIG)    Standing Status:   Future    Number of Occurrences:   1    Standing Expiration Date:   07/27/2019  . Hepatitis panel, acute    Standing Status:   Future    Number of Occurrences:   1    Standing Expiration Date:   07/28/2019  . HIV Antibody (routine testing w  rflx)    Standing Status:   Future    Number of Occurrences:   1    Standing Expiration Date:   07/28/2019  . Methylmalonic acid, serum    Standing Status:   Future    Number of Occurrences:   1    Standing Expiration Date:   07/28/2019  . Reticulocytes    Standing Status:   Future    Number of  Occurrences:   1    Standing Expiration Date:   07/28/2019  . TSH    Standing Status:   Future    Number of Occurrences:   1    Standing Expiration Date:   07/28/2019    All questions were answered. The patient knows to call the clinic with any problems questions or concerns.  Return of visit: 2 weeks Thank you for this kind referral and the opportunity to participate in the care of this patient. A copy of today's note is routed to referring provider  Total face to face encounter time for this patient visit was 52mn. >50% of the time was  spent in counseling and coordination of care.    ZEarlie Server MD, PhD Hematology Oncology CPromedica Monroe Regional Hospitalat AJefferson Regional Medical CenterPager- [phone]/14/2019

## 2018-07-27 NOTE — Progress Notes (Signed)
Patient here for initial visit. He complains of pain to mid low back. BP elevated, he states his PCP took him off of HCTZ and his blood pressure has been elevated ever since. Pt asymptomatic.

## 2018-07-28 ENCOUNTER — Emergency Department: Payer: BLUE CROSS/BLUE SHIELD

## 2018-07-28 ENCOUNTER — Encounter: Payer: Self-pay | Admitting: Emergency Medicine

## 2018-07-28 ENCOUNTER — Emergency Department
Admission: EM | Admit: 2018-07-28 | Discharge: 2018-07-28 | Disposition: A | Discharge status: Home / Self Care | Payer: BLUE CROSS/BLUE SHIELD | Attending: Emergency Medicine | Admitting: Emergency Medicine

## 2018-07-28 ENCOUNTER — Ambulatory Visit
Admission: RE | Admit: 2018-07-28 | Discharge: 2018-07-28 | Disposition: A | Discharge status: Home / Self Care | Payer: BLUE CROSS/BLUE SHIELD | Source: Ambulatory Visit | Attending: Gastroenterology | Admitting: Gastroenterology

## 2018-07-28 DIAGNOSIS — Z79899 Other long term (current) drug therapy: Secondary | ICD-10-CM | POA: Insufficient documentation

## 2018-07-28 DIAGNOSIS — R071 Chest pain on breathing: Secondary | ICD-10-CM | POA: Diagnosis not present

## 2018-07-28 DIAGNOSIS — D649 Anemia, unspecified: Secondary | ICD-10-CM

## 2018-07-28 DIAGNOSIS — N281 Cyst of kidney, acquired: Secondary | ICD-10-CM | POA: Diagnosis not present

## 2018-07-28 DIAGNOSIS — I10 Essential (primary) hypertension: Secondary | ICD-10-CM | POA: Insufficient documentation

## 2018-07-28 DIAGNOSIS — J90 Pleural effusion, not elsewhere classified: Secondary | ICD-10-CM | POA: Diagnosis not present

## 2018-07-28 DIAGNOSIS — K760 Fatty (change of) liver, not elsewhere classified: Secondary | ICD-10-CM | POA: Diagnosis not present

## 2018-07-28 DIAGNOSIS — R079 Chest pain, unspecified: Secondary | ICD-10-CM | POA: Diagnosis not present

## 2018-07-28 DIAGNOSIS — J918 Pleural effusion in other conditions classified elsewhere: Secondary | ICD-10-CM | POA: Insufficient documentation

## 2018-07-28 DIAGNOSIS — R0789 Other chest pain: Secondary | ICD-10-CM | POA: Diagnosis not present

## 2018-07-28 LAB — HIV ANTIBODY (ROUTINE TESTING W REFLEX): HIV Screen 4th Generation wRfx: NONREACTIVE

## 2018-07-28 LAB — BASIC METABOLIC PANEL
Anion gap: 8 (ref 5–15)
BUN: 23 mg/dL (ref 8–23)
CALCIUM: 10.1 mg/dL (ref 8.9–10.3)
CO2: 26 mmol/L (ref 22–32)
CREATININE: 1.53 mg/dL — AB (ref 0.61–1.24)
Chloride: 109 mmol/L (ref 98–111)
GFR calc Af Amer: 55 mL/min — ABNORMAL LOW (ref >60)
GFR, EST NON AFRICAN AMERICAN: 47 mL/min — AB (ref >60)
GLUCOSE: 112 mg/dL — AB (ref 70–99)
POTASSIUM: 3.9 mmol/L (ref 3.5–5.1)
SODIUM: 143 mmol/L (ref 135–145)

## 2018-07-28 LAB — BRAIN NATRIURETIC PEPTIDE: B Natriuretic Peptide: 33 pg/mL (ref 0.0–100.0)

## 2018-07-28 LAB — CBC
HCT: 29.9 % — ABNORMAL LOW (ref 39.0–52.0)
Hemoglobin: 10.2 g/dL — ABNORMAL LOW (ref 13.0–17.0)
MCH: 32.5 pg (ref 26.0–34.0)
MCHC: 34.1 g/dL (ref 30.0–36.0)
MCV: 95.2 fL (ref 80.0–100.0)
PLATELETS: 103 10*3/uL — AB (ref 150–400)
RBC: 3.14 MIL/uL — AB (ref 4.22–5.81)
RDW: 14.5 % (ref 11.5–15.5)
WBC: 5.2 10*3/uL (ref 4.0–10.5)
nRBC: 0 % (ref 0.0–0.2)

## 2018-07-28 LAB — HEPATITIS PANEL, ACUTE
HEP A IGM: NEGATIVE
Hep B C IgM: NEGATIVE
Hepatitis B Surface Ag: NEGATIVE

## 2018-07-28 LAB — TROPONIN I: Troponin I: <0.03 ng/mL (ref <0.03)

## 2018-07-28 IMAGING — US US ABDOMEN COMPLETE
1 series · 14 of 25 positions shown · non-contrast
Comparison: None.

CLINICAL DATA: Anemia

EXAM:
ABDOMEN ULTRASOUND COMPLETE

[Series 1: us abdomen complete · 14 of 136 slices shown]
[im 1/136]
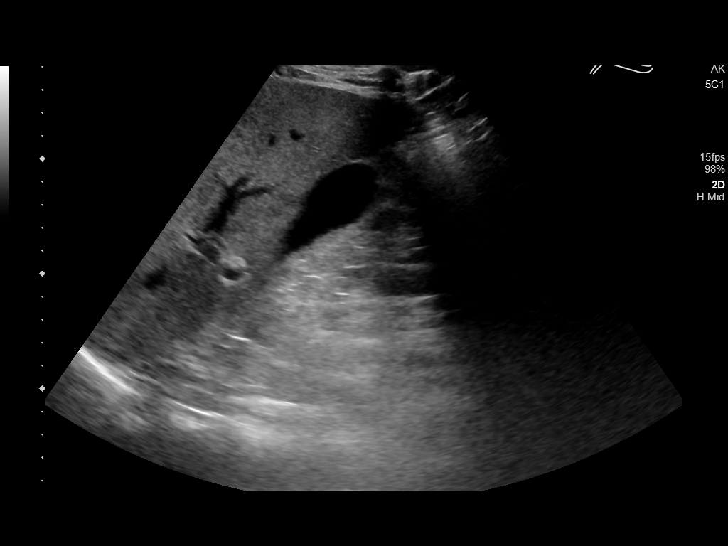
[im 12/136]
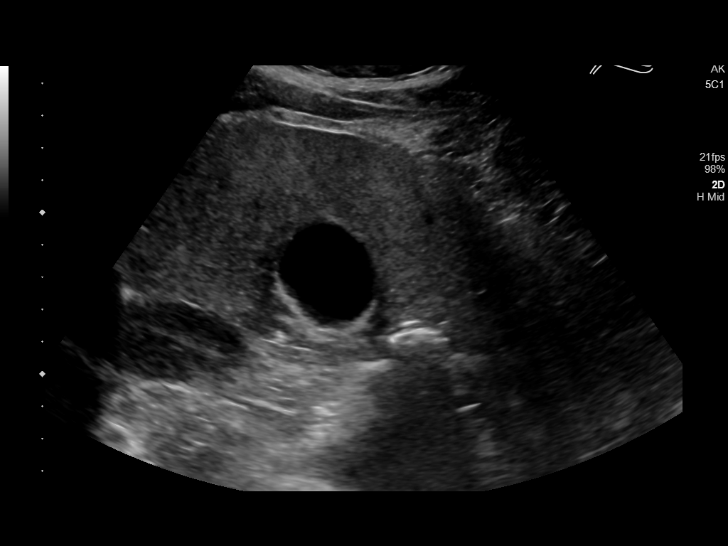
[im 23/136]
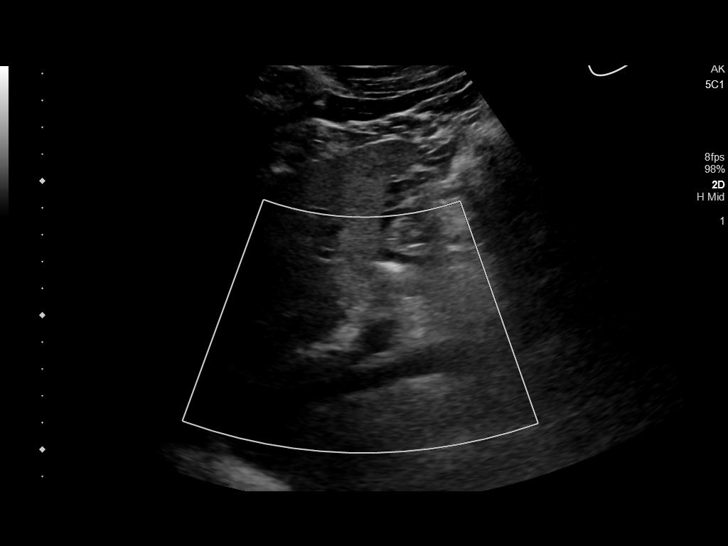
[im 34/136]
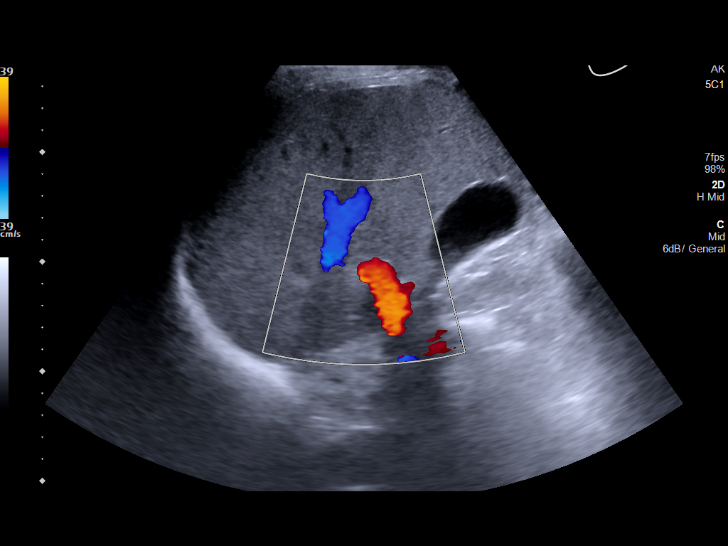
[im 46/136]
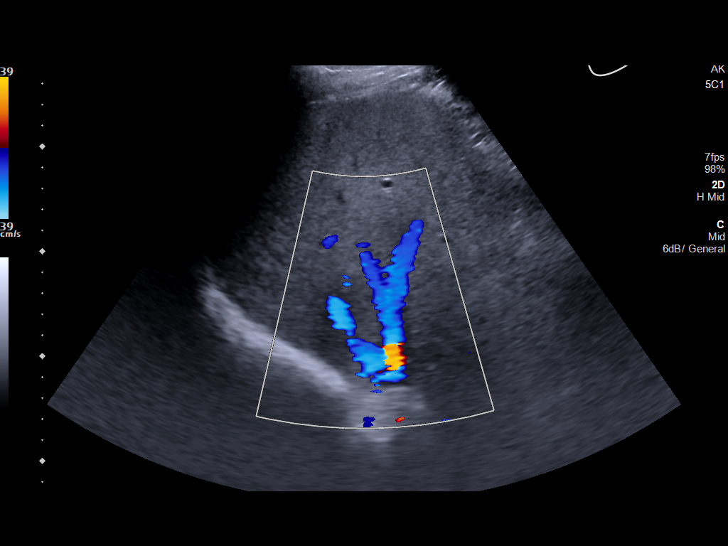
[im 51/136]
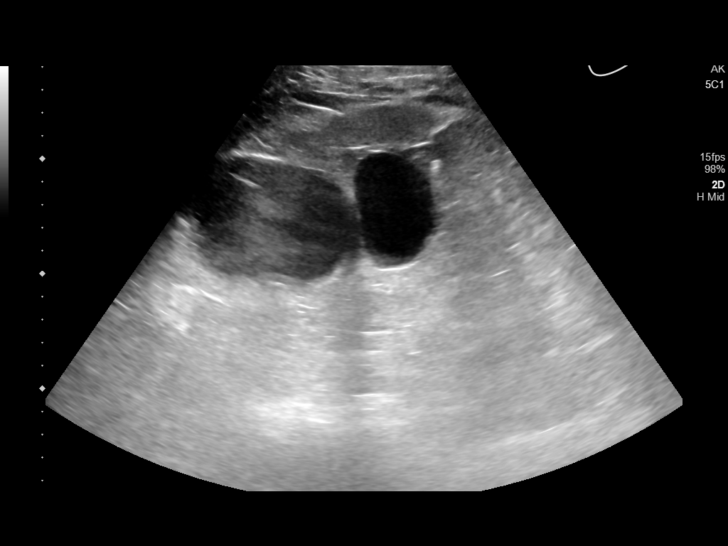
[im 62/136]
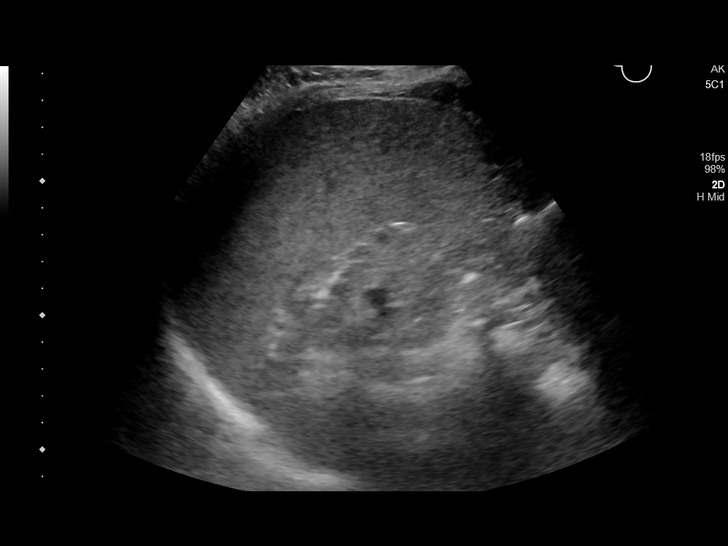
[im 74/136]
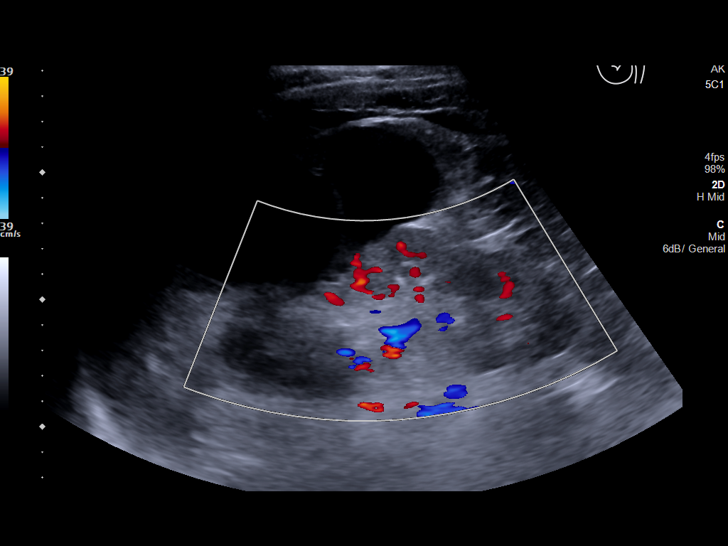
[im 85/136]
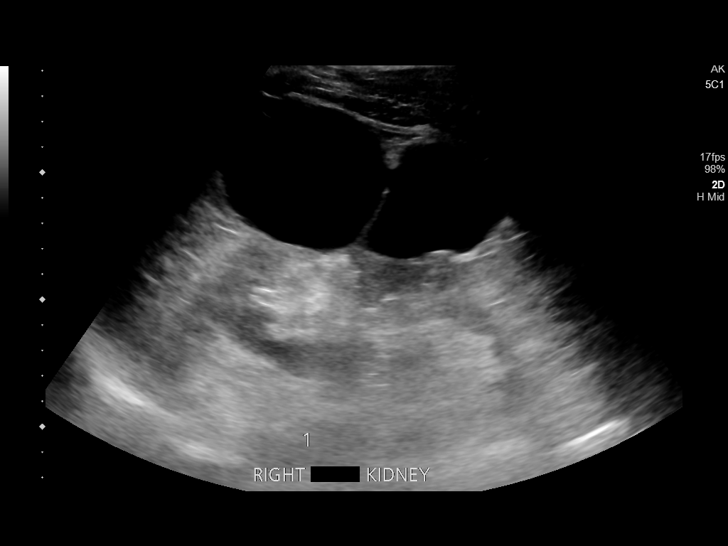
[im 91/136]
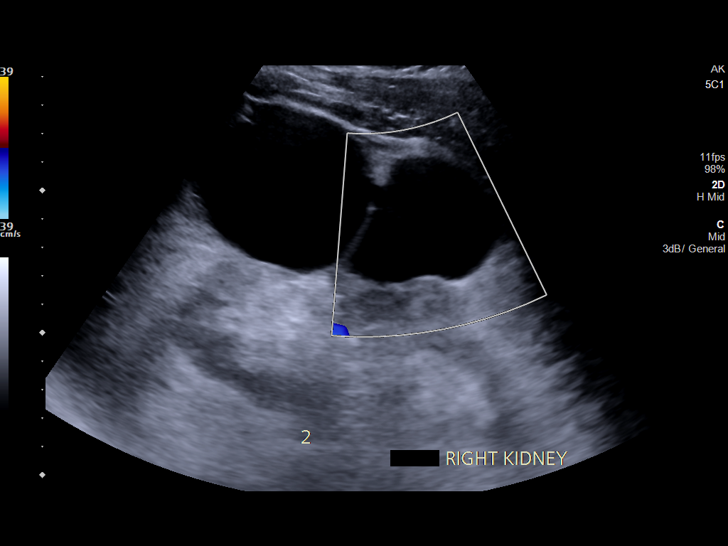
[im 102/136]
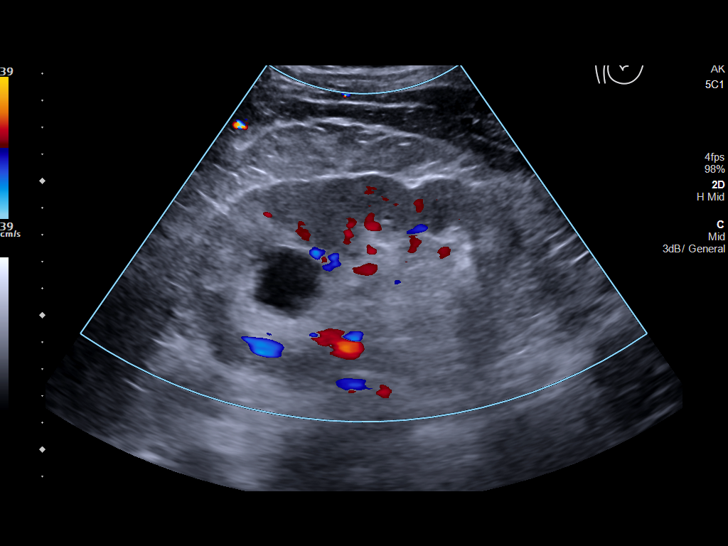
[im 113/136]
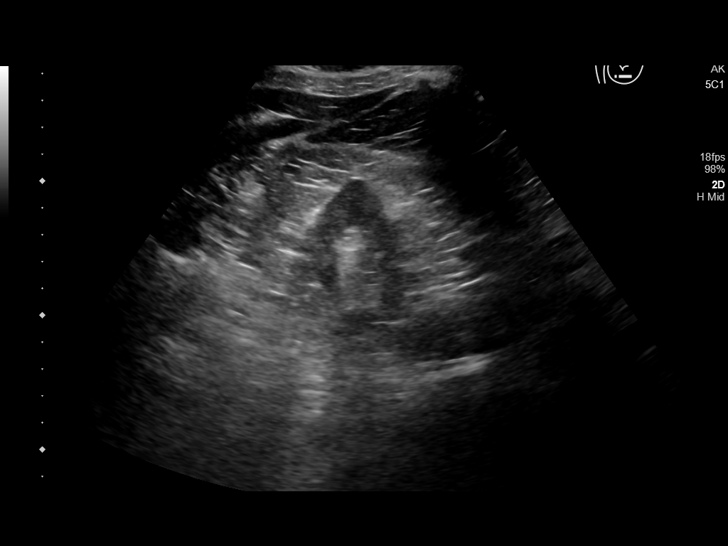
[im 124/136]
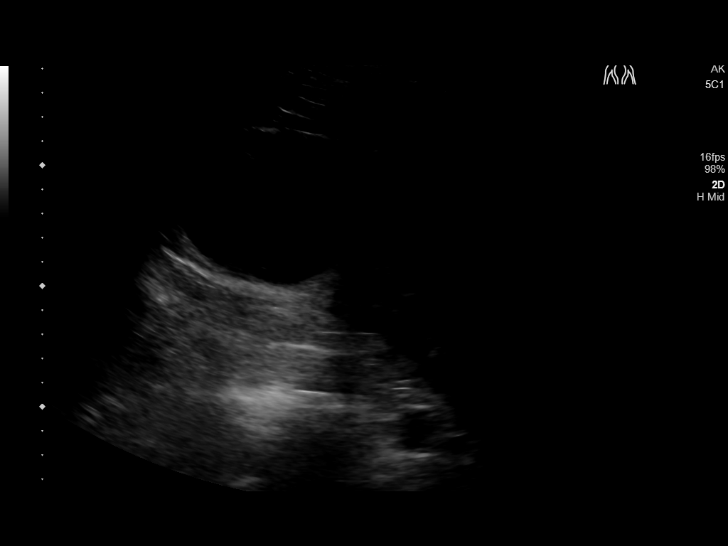
[im 136/136]
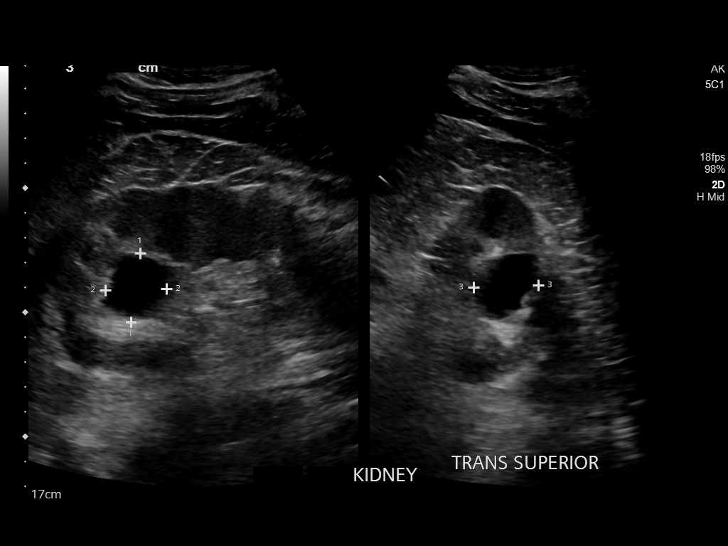

[14 of 25 positions shown; findings below may reference images not displayed]

FINDINGS: Gallbladder: No gallstones or wall thickening visualized. No
sonographic Murphy sign noted by sonographer.

Common bile duct: Diameter: Normal caliber, 4 mm

Liver: Increased echotexture compatible with fatty infiltration. No
focal abnormality or biliary ductal dilatation. Portal vein is
patent on color Doppler imaging with normal direction of blood flow
towards the liver.

IVC: No abnormality visualized.

Pancreas: Visualized portion unremarkable.

Spleen: Size and appearance within normal limits.

Right Kidney: Length: 14.3 cm. Multiple cysts, the largest 7 cm in
the upper pole. No hydronephrosis. Normal echotexture.

Left Kidney: Length: 15.3 cm. 2.8 cm upper pole cyst. No
hydronephrosis.

Abdominal aorta: No aneurysm visualized.

Other findings: None.
IMPRESSION: Fatty infiltration of the liver.

Bilateral renal cysts.

No acute findings.

## 2018-07-28 IMAGING — CT CT ANGIO CHEST
2 of 6 series · 17 of 46 positions shown · IV contrast (APPLIED)
Comparison: Chest radiograph-[DATE]

CLINICAL DATA: Constant left anterior chest pain since 2 o'clock
this morning. Evaluate for pulmonary embolism.

EXAM:
CT ANGIOGRAPHY CHEST WITH CONTRAST
TECHNIQUE: Multidetector CT imaging of the chest was performed using the
standard protocol during bolus administration of intravenous
contrast. Multiplanar CT image reconstructions and MIPs were
obtained to evaluate the vascular anatomy.
CONTRAST:  75mL OMNIPAQUE IOHEXOL 350 MG/ML SOLN

[Series 5: thins · axial · 0.73mm/px · z∈[-728,-466]mm · 14 of 288 slices shown]
[im 13/288  lung]
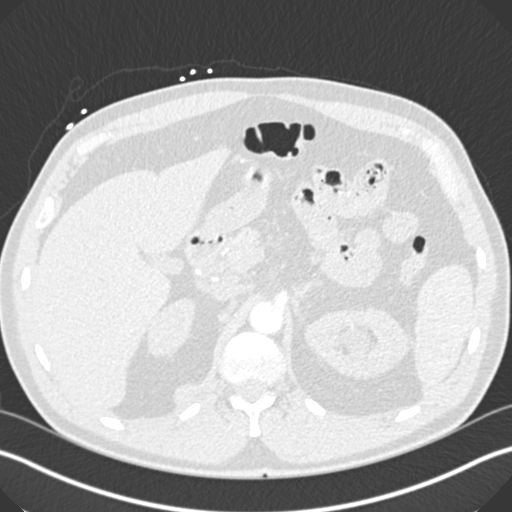
[im 38/288  soft-tissue]
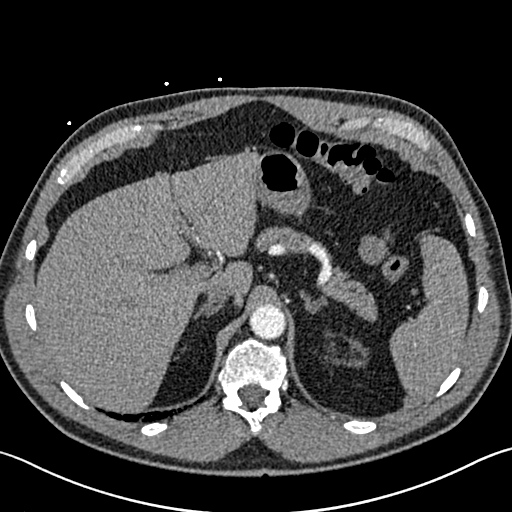
[im 50/288  lung]
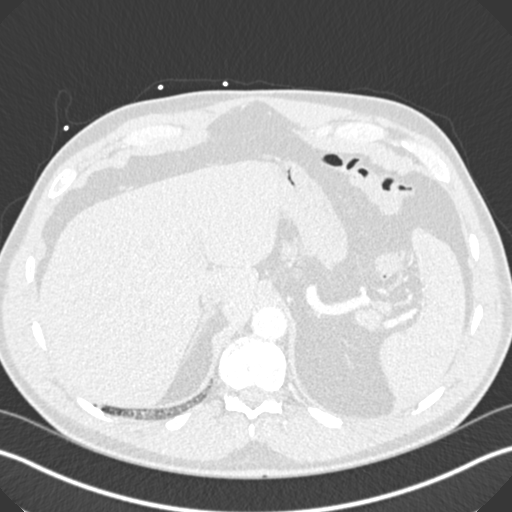
[im 75/288  soft-tissue]
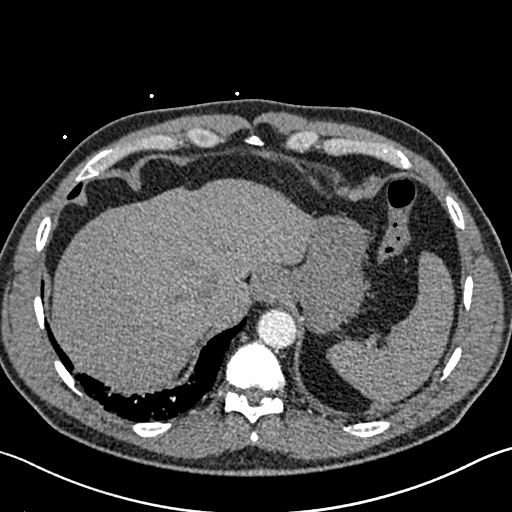
[im 100/288  lung]
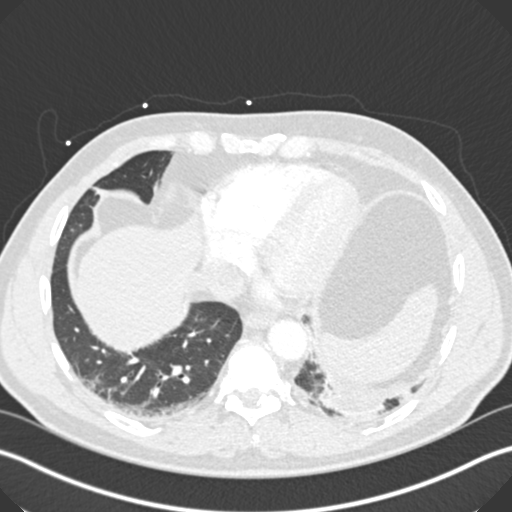
[im 113/288  soft-tissue]
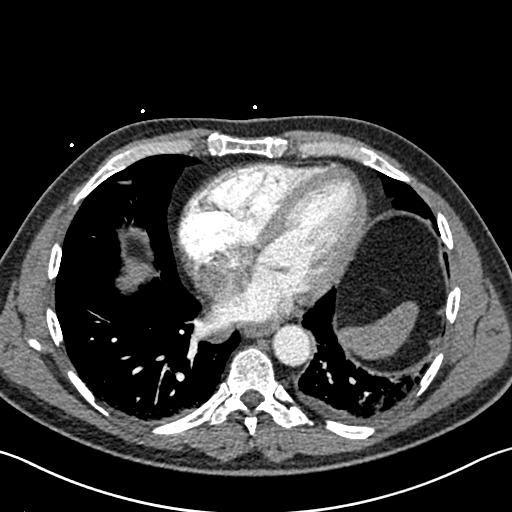
[im 138/288  lung]
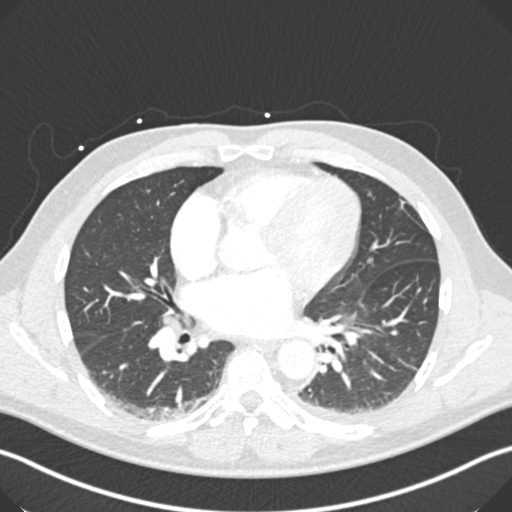
[im 150/288  soft-tissue]
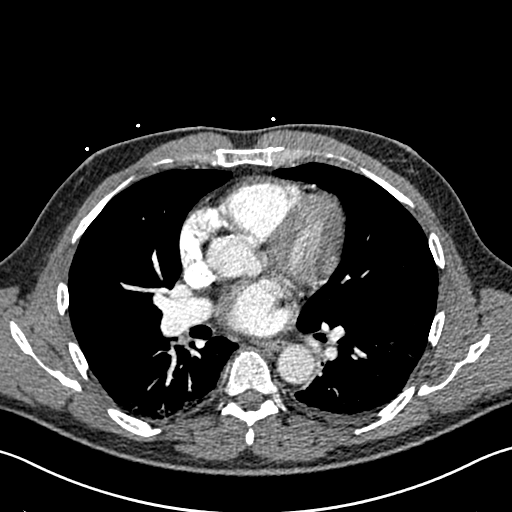
[im 175/288  lung]
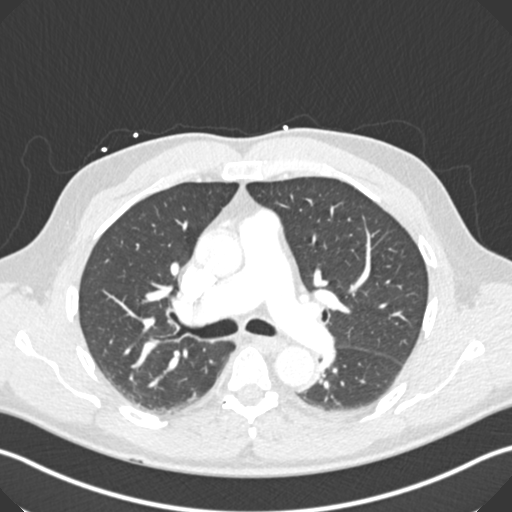
[im 188/288  soft-tissue]
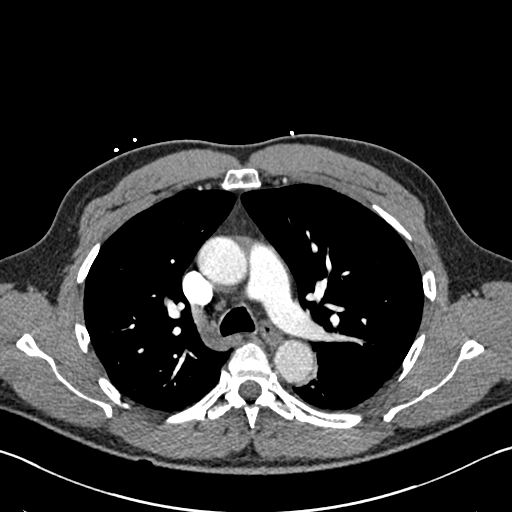
[im 213/288  lung]
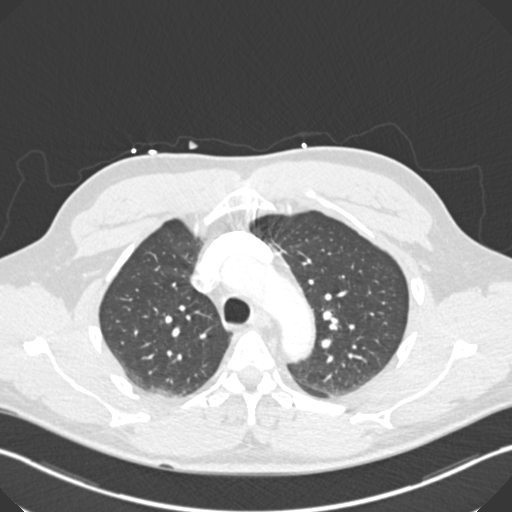
[im 238/288  soft-tissue]
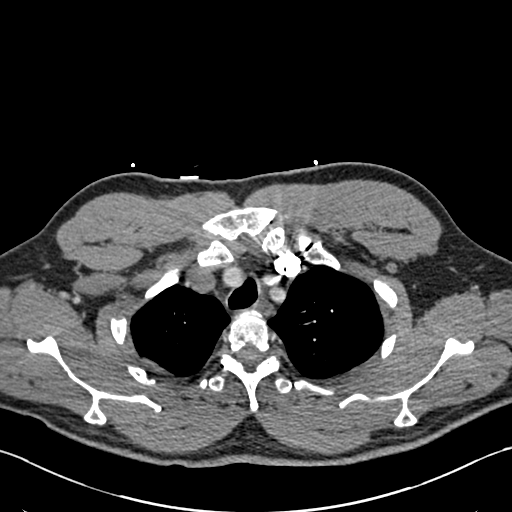
[im 250/288  lung]
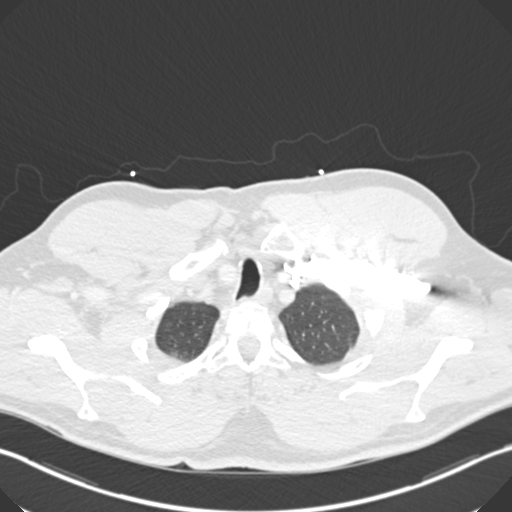
[im 275/288  soft-tissue]
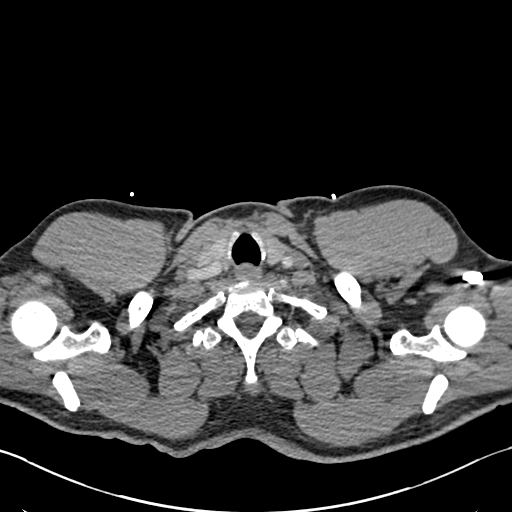

[Series 7: coronal mpr · coronal · 0.57mm/px · 3 of 95 slices shown]
[im 24/95  soft-tissue]
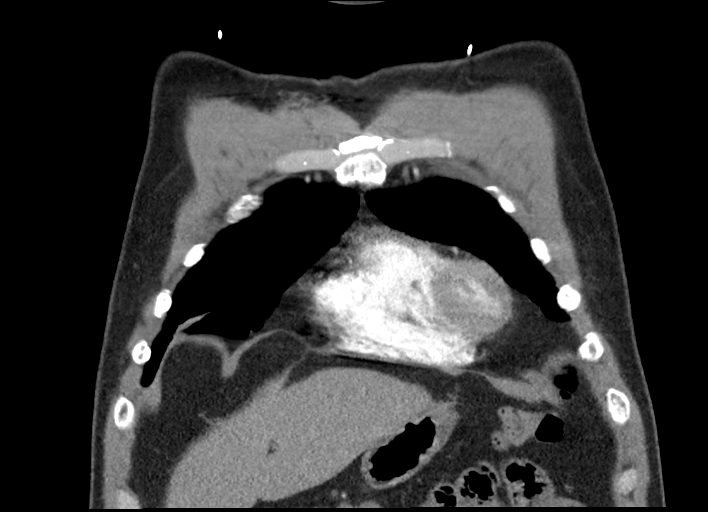
[im 48/95  soft-tissue]
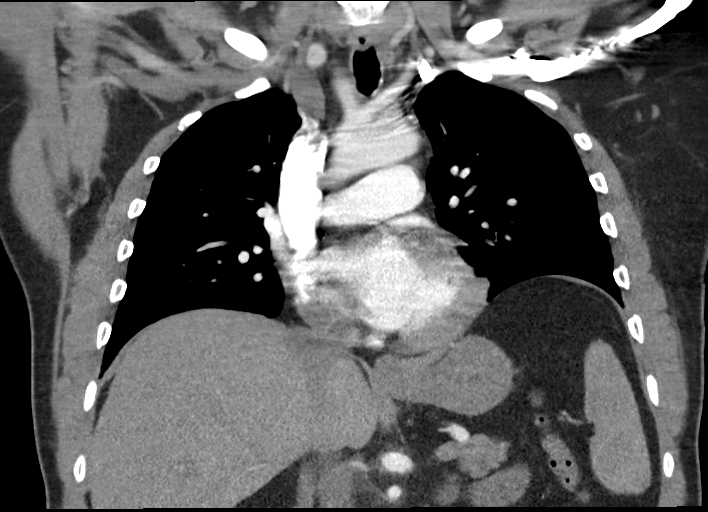
[im 71/95  soft-tissue]
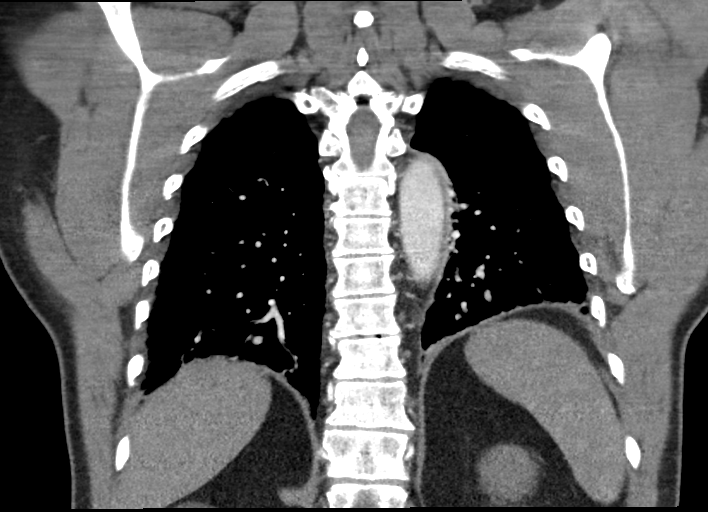

[17 of 46 positions shown; findings below may reference images not displayed]

FINDINGS: Vascular Findings:

There is adequate opacification of the pulmonary arterial system
with the main pulmonary artery measuring 236 Hounsfield units. There
are no discrete filling defects within the pulmonary arterial tree
to suggest pulmonary embolism. Normal caliber of the main pulmonary
artery.

Borderline cardiomegaly.  No pericardial effusion.

Normal caliber of the thoracic aorta. No evidence of thoracic aortic
dissection or periaortic stranding on this nongated examination.
Bovine configuration of the aortic arch. The branch vessels of the
aortic arch appear widely patent throughout their imaged course.
Note is made of a aortic nipple.

Review of the MIP images confirms the above findings.

----------------------------------------------------------------------------------

Nonvascular Findings:

Mediastinum/Lymph Nodes: No bulky mediastinal, hilar or axillary
lymphadenopathy.

Lungs/Pleura: Minimal dependent subpleural ground-glass atelectasis.
Minimal subsegmental atelectasis within the right middle lobe and
lingula. Trace left-sided pleural effusion. No focal airspace
opacities. No discrete pulmonary nodules. Central pulmonary airways
appear widely patent.

Upper abdomen: Limited early arterial phase evaluation of the upper
abdomen demonstrates mild nodularity of the hepatic contour.
Suspected bilateral renal cysts with dominant left-sided parapelvic
cyst measuring 2.8 cm in diameter, incompletely imaged. Note is made
of a small splenule about the splenic hilum.

Musculoskeletal: No acute or aggressive osseous abnormalities. Mild
degenerative change at T11-T12 with disc space height loss endplate
irregularity anteriorly directed osteophytosis. Regional soft
tissues appear normal. Normal appearance of the thyroid gland.
IMPRESSION: 1. Trace left-sided pleural effusion with associated left basilar
atelectasis. Otherwise, no acute cardiopulmonary disease.
Specifically, no evidence of pulmonary embolism.
2. Borderline cardiomegaly.
3. Mild nodularity of the hepatic contour as could be seen in the
setting of early cirrhotic change. Correlation with LFTs could be
performed as indicated.

## 2018-07-28 MED ORDER — FENTANYL CITRATE (PF) 100 MCG/2ML IJ SOLN
100.0000 ug | Freq: Once | INTRAMUSCULAR | Status: AC
  Administered 2018-07-28: 100 ug via INTRAVENOUS
  Filled 2018-07-28: qty 2

## 2018-07-28 MED ORDER — HYDROMORPHONE HCL 1 MG/ML IJ SOLN
1.0000 mg | Freq: Once | INTRAMUSCULAR | Status: AC
  Administered 2018-07-28: 1 mg via INTRAVENOUS
  Filled 2018-07-28: qty 1

## 2018-07-28 MED ORDER — AMLODIPINE BESYLATE 5 MG PO TABS: 7.5000 mg | ORAL_TABLET | Freq: Every day | ORAL | Status: DC

## 2018-07-28 MED ORDER — OXYCODONE-ACETAMINOPHEN 5-325 MG PO TABS: 1.0000 | ORAL_TABLET | ORAL | 0 refills | Status: DC | PRN

## 2018-07-28 MED ORDER — SODIUM CHLORIDE 0.9 % IV BOLUS: 1000.0000 mL | Freq: Once | INTRAVENOUS | Status: DC

## 2018-07-28 MED ORDER — IOHEXOL 350 MG/ML SOLN
75.0000 mL | Freq: Once | INTRAVENOUS | Status: AC | PRN
  Administered 2018-07-28: 75 mL via INTRAVENOUS

## 2018-07-28 NOTE — ED Provider Notes (Addendum)
[facility] Emergency Department Provider Note  ____________________________________________  Time seen: Approximately 9:35 AM  I have reviewed the triage vital signs and the nursing notes.   HISTORY  Chief Complaint Chest Pain    HPI RICKARDO BRINEGAR is a 62 y.o. male history of HTN, HL, currently under evaluation for anemia, presenting with left-sided chest pain.  Reports that for the past several weeks, he has been under evaluation for new diffuse lower back pain with anemia, with reassuring endoscopy and colonoscopy, and today underwent kidney, bladder and bowel bladder ultrasound.  Patient has been treating his back pain with Tylenol.  He denies any dysuria, urinary frequency or hematuria.  He has not had any lightheadedness, fainting, or shortness of breath.  At 2 AM, the patient woke up with a completely new sharp chest pain approximately 1 inch lateral to the left nipple that is worse when he presses on it, with positional changes, and with deep breaths.  He denies any trauma or recent strain, no new exercise regimens.  He has not had any cough or cold symptoms, fever chills, calf pain.  He has no personal or family history of blood clots, and denies tobacco or cocaine use.  He had to be n.p.o. for his studies this morning so has not tried anything for his pain.  Past Medical History:  Diagnosis Date  . Hyperlipidemia 03/2004  . Hypertension 1994  . Snores     Patient Active Problem List   Diagnosis Date Noted  . Anemia 07/06/2018  . Creatinine elevation 07/06/2018  . Headache 07/02/2018  . Snoring 06/10/2018  . FH: colon cancer 06/10/2018  . Advance care planning 11/24/2015  . Routine general medical examination at a health care facility 04/29/2012  . ESSENTIAL HYPERTENSION, BENIGN 01/20/2008  . HYPERLIPIDEMIA, WITH LOW HDL 06/02/2007  . 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 CATARACT  EXTRACTION W/PHACO Right 03/04/2018   Procedure: CATARACT EXTRACTION PHACO AND INTRAOCULAR LENS PLACEMENT ([place])  RIGHT TORIC;  Surgeon: Leandrew Koyanagi, MD;  Location: [place];  Service: Ophthalmology;  Laterality: Right;  Per [place] no Toric Lens 1:45 5.3  . COLONOSCOPY WITH PROPOFOL N/A 07/20/2018   Procedure: COLONOSCOPY WITH PROPOFOL;  Surgeon: Jonathon Bellows, MD;  Location: [facility] ENDOSCOPY;  Service: Gastroenterology;  Laterality: N/A;  . ESOPHAGOGASTRODUODENOSCOPY (EGD) WITH PROPOFOL N/A 07/20/2018   Procedure: ESOPHAGOGASTRODUODENOSCOPY (EGD) WITH PROPOFOL;  Surgeon: Jonathon Bellows, MD;  Location: [facility] ENDOSCOPY;  Service: Gastroenterology;  Laterality: N/A;  . EYE SURGERY Right   . HERNIA REPAIR    . L Lap herniorraphy  04/2000  . Left wrist ganglionectomy      Current Outpatient Rx  . Order #: [id number] Class: Historical Med  . Order #: [id number] Class: No Print  . Order #: [id number] Class: Historical Med  . Order #: [id number] Class: Normal  . Order #: [id number] Class: Print    Allergies Tadalafil  Family History  Problem Relation Age of Onset  . Hypertension Mother   . Diabetes Mother   . Heart disease Mother        CAD  . Dementia Mother   . Hypertension Father   . Heart disease Father        MI 02/03  . Colon cancer Father   . Hypertension Sister   . Hypertension Sister   . Hypertension Sister   . Prostate cancer Neg Hx     Social History Social History   Tobacco Use  .  Smoking status: Never Smoker  . Smokeless tobacco: Never Used  . Tobacco comment: Occassionally  Substance Use Topics  . Alcohol use: Not Currently    Alcohol/week: 3.0 standard drinks    Types: 3 Cans of beer per week  . Drug use: No    Review of Systems Constitutional: No fever/chills.  No lightheadedness or syncope.  No diaphoresis. Eyes: No visual changes. ENT: No sore throat. No congestion or rhinorrhea. Cardiovascular: Positive left-sided chest pain. Denies  palpitations. Respiratory: Denies shortness of breath.  No cough. Gastrointestinal: No abdominal pain.  No nausea, no vomiting.  No diarrhea.  No constipation. Genitourinary: Negative for dysuria. Musculoskeletal: Negative for back pain.  No lower extremity swelling or calf pain. Skin: Negative for rash. Neurological: Negative for headaches. No focal numbness, tingling or weakness.     ____________________________________________   PHYSICAL EXAM:  VITAL SIGNS: ED Triage Vitals  Enc Vitals Group     BP 07/28/18 0833 (!) 182/100     Pulse Rate 07/28/18 0833 79     Resp 07/28/18 0833 20     Temp 07/28/18 0833 98.1 F (36.7 C)     Temp Source 07/28/18 0833 Oral     SpO2 07/28/18 0833 98 %     Weight 07/28/18 0834 200 lb (90.7 kg)     Height 07/28/18 0834 5\' 10"  (1.778 m)     Head Circumference --      Peak Flow --      Pain Score 07/28/18 0833 8     Pain Loc --      Pain Edu? --      Excl. in [place]? --     Constitutional: Alert and oriented. Answers questions appropriately.  Uncomfortable appearing and wincing with deep breaths or positional changes. Eyes: Conjunctivae are normal.  EOMI. No scleral icterus. Head: Atraumatic. Nose: No congestion/rhinnorhea. Mouth/Throat: Mucous membranes are moist.  Neck: No stridor.  Supple.  No JVD.  No meningismus. Cardiovascular: Normal rate, regular rhythm. No murmurs, rubs or gallops.  Respiratory: Normal respiratory effort.  No accessory muscle use or retractions. Lungs CTAB.  No wheezes, rales or ronchi. Gastrointestinal: Soft, nontender and nondistended.  No guarding or rebound.  No peritoneal signs. Musculoskeletal: No LE edema. No ttp in the calves or palpable cords.  Negative Homan's sign. Neurologic:  A&Ox3.  Speech is clear.  Face and smile are symmetric.  EOMI.  Moves all extremities well. Skin:  Skin is warm, dry and intact. No rash noted. Psychiatric: Mood and affect are normal. Speech and behavior are normal.  Normal  judgement.  ____________________________________________   LABS (all labs ordered are listed, but only abnormal results are displayed)  Labs Reviewed  CBC - Abnormal; Notable for the following components:      Result Value   RBC 3.14 (*)    Hemoglobin 10.2 (*)    HCT 29.9 (*)    Platelets 103 (*)    All other components within normal limits  BASIC METABOLIC PANEL - Abnormal; Notable for the following components:   Glucose, Bld 112 (*)    Creatinine, Ser 1.53 (*)    GFR calc non Af Amer 47 (*)    GFR calc Af Amer 55 (*)    All other components within normal limits  TROPONIN I  TROPONIN I  BRAIN NATRIURETIC PEPTIDE   ____________________________________________  EKG  ED ECG REPORT I, Anne-Caroline Mariea Clonts, the attending physician, personally viewed and interpreted this ECG. 832  Date: 07/28/2018  EKG Time: 832  Rate: 76  Rhythm: normal sinus rhythm  Axis: leftward  Intervals:none  ST&T Change: No STEMI  ____________________________________________  RADIOLOGY  Ct Angio Chest Pe W And/or Wo Contrast  Result Date: 07/28/2018 CLINICAL DATA:  Constant left anterior chest pain since 2 o'clock this morning. Evaluate for pulmonary embolism. EXAM: CT ANGIOGRAPHY CHEST WITH CONTRAST TECHNIQUE: Multidetector CT imaging of the chest was performed using the standard protocol during bolus administration of intravenous contrast. Multiplanar CT image reconstructions and MIPs were obtained to evaluate the vascular anatomy. CONTRAST:  49mL OMNIPAQUE IOHEXOL 350 MG/ML SOLN COMPARISON:  Chest radiograph-09/27/2009 FINDINGS: Vascular Findings: There is adequate opacification of the pulmonary arterial system with the main pulmonary artery measuring 236 Hounsfield units. There are no discrete filling defects within the pulmonary arterial tree to suggest pulmonary embolism. Normal caliber of the main pulmonary artery. Borderline cardiomegaly.  No pericardial effusion. Normal caliber of the  thoracic aorta. No evidence of thoracic aortic dissection or periaortic stranding on this nongated examination. Bovine configuration of the aortic arch. The branch vessels of the aortic arch appear widely patent throughout their imaged course. Note is made of a aortic nipple. Review of the MIP images confirms the above findings. ---------------------------------------------------------------------------------- Nonvascular Findings: Mediastinum/Lymph Nodes: No bulky mediastinal, hilar or axillary lymphadenopathy. Lungs/Pleura: Minimal dependent subpleural ground-glass atelectasis. Minimal subsegmental atelectasis within the right middle lobe and lingula. Trace left-sided pleural effusion. No focal airspace opacities. No discrete pulmonary nodules. Central pulmonary airways appear widely patent. Upper abdomen: Limited early arterial phase evaluation of the upper abdomen demonstrates mild nodularity of the hepatic contour. Suspected bilateral renal cysts with dominant left-sided parapelvic cyst measuring 2.8 cm in diameter, incompletely imaged. Note is made of a small splenule about the splenic hilum. Musculoskeletal: No acute or aggressive osseous abnormalities. Mild degenerative change at T11-T12 with disc space height loss endplate irregularity anteriorly directed osteophytosis. Regional soft tissues appear normal. Normal appearance of the thyroid gland. IMPRESSION: 1. Trace left-sided pleural effusion with associated left basilar atelectasis. Otherwise, no acute cardiopulmonary disease. Specifically, no evidence of pulmonary embolism. 2. Borderline cardiomegaly. 3. Mild nodularity of the hepatic contour as could be seen in the setting of early cirrhotic change. Correlation with LFTs could be performed as indicated. Electronically Signed   By: Sandi Mariscal M.D.   On: 07/28/2018 11:00   US Abdomen Complete  Result Date: 07/28/2018 CLINICAL DATA:  Anemia EXAM: ABDOMEN ULTRASOUND COMPLETE COMPARISON:  None.  FINDINGS: Gallbladder: No gallstones or wall thickening visualized. No sonographic Murphy sign noted by sonographer. Common bile duct: Diameter: Normal caliber, 4 mm Liver: Increased echotexture compatible with fatty infiltration. No focal abnormality or biliary ductal dilatation. Portal vein is patent on color Doppler imaging with normal direction of blood flow towards the liver. IVC: No abnormality visualized. Pancreas: Visualized portion unremarkable. Spleen: Size and appearance within normal limits. Right Kidney: Length: 14.3 cm. Multiple cysts, the largest 7 cm in the upper pole. No hydronephrosis. Normal echotexture. Left Kidney: Length: 15.3 cm. 2.8 cm upper pole cyst. No hydronephrosis. Abdominal aorta: No aneurysm visualized. Other findings: None. IMPRESSION: Fatty infiltration of the liver. Bilateral renal cysts. No acute findings. Electronically Signed   By: Rolm Baptise M.D.   On: 07/28/2018 08:43    ____________________________________________   PROCEDURES  Procedure(s) performed: None  Procedures  Critical Care performed: No ____________________________________________   INITIAL IMPRESSION / ASSESSMENT AND PLAN / ED COURSE  Pertinent labs & imaging results that were available during my care of the patient were reviewed by me and  considered in my medical decision making (see chart for details).  62 y.o. male with hypertension and hyperlipidemia, new anemia, presenting with acute onset of positional and pleuritic left-sided chest pain.  Overall, the patient is hypertensive to 182/100, but he is significantly uncomfortable.  I will continue to monitor his blood pressure with symptomatic treatment.  The patient's pain is reproducible on my examination, and may be due to muscle strain.  However, given the pleuritic nature of his pain, will get a CT scan to rule out PE.  I do hear good breath sounds bilaterally so pneumothorax is less likely unless he has a partial pneumothorax.  The  patient's EKG does not show any evidence of ischemic changes and a troponin is pending.  Plan reevaluation for final disposition.  ----------------------------------------- 1:10 PM on 07/28/2018 -----------------------------------------  The patient's work-up in the emergency department has been reassuring.  His blood counts show a stable unchanged anemia and his white blood cell count is normal.  His troponin has been negative twice.  His electrolytes are reassuring.  He has a chronic renal insufficiency.  His BNP is within normal limits.  His CT scan does not show a PE; he has a trace left-sided pleural effusion, which may be from splinting although I have let him know this result and emphasized the importance of follow-up.  The patient will continue to follow-up with his primary care physician.  He will be discharged with instructions to take Tylenol for mild to moderate pain and Percocet for severe pain.  He understands restrictions when taking Percocet.  At this time the patient is safe for discharge home.  He understands follow-up instructions as well as return precautions.  ____________________________________________  FINAL CLINICAL IMPRESSION(S) / ED DIAGNOSES  Final diagnoses:  Left-sided chest pain  Pleural effusion, left         NEW MEDICATIONS STARTED DURING THIS VISIT:  New Prescriptions   OXYCODONE-ACETAMINOPHEN (PERCOCET) 5-325 MG TABLET    Take 1 tablet by mouth every 4 (four) hours as needed for severe pain.      Eula Listen, MD 07/28/18 5631    Eula Listen, MD 07/28/18 1311

## 2018-07-28 NOTE — ED Notes (Signed)
Patient transported to CT 

## 2018-07-28 NOTE — ED Triage Notes (Signed)
Pt reports CP that started last pm to left chest, sharpe in nature and non-radiating. Pt reports pain gets worse when he takes a deep breath.

## 2018-07-28 NOTE — Telephone Encounter (Signed)
Left detailed message on voicemail with instructions to call if any questions.  DPR

## 2018-07-28 NOTE — Discharge Instructions (Addendum)
Please return to the emergency department if you develop severe pain, lightheadedness or fainting, palpitations, fever, or any other symptoms concerning to you.  You may take Tylenol for mild to moderate pain, and Percocet for severe pain.  Do not drive within 8 hours of taking Percocet.

## 2018-07-28 NOTE — Telephone Encounter (Signed)
It is possible that when the patient has high blood pressure for a long period of time, that can affect their kidney function and that in turn could then affect their blood counts.  I am awaiting input from the hematology clinic about all of that.  I know that he went for the visit yesterday.  I am awaiting his follow-up labs from that visit.  In the meantime I would increase his amlodipine.  He has a 5 mg tablet.  He can increase to 7.5 mg a day.  If his blood pressure is still persistently above 128 systolic or above 90 diastolic after about 5 days, then he can increase to 10 mg a day.  10 mg of amlodipine is the maximum dose.  Update me as needed.  Thanks.

## 2018-07-28 NOTE — Addendum Note (Signed)
Addended by: Tonia Ghent on: 07/28/2018 06:49 AM   Modules accepted: Orders

## 2018-07-29 ENCOUNTER — Encounter: Payer: Self-pay | Admitting: Family Medicine

## 2018-07-29 DIAGNOSIS — K760 Fatty (change of) liver, not elsewhere classified: Secondary | ICD-10-CM | POA: Insufficient documentation

## 2018-07-29 LAB — METHYLMALONIC ACID, SERUM: METHYLMALONIC ACID, QUANTITATIVE: 247 nmol/L (ref 0–378)

## 2018-07-30 ENCOUNTER — Telehealth: Payer: Self-pay

## 2018-07-30 ENCOUNTER — Telehealth: Payer: Self-pay | Admitting: *Deleted

## 2018-07-30 LAB — MULTIPLE MYELOMA PANEL, SERUM
ALPHA 1: 0.2 g/dL (ref 0.0–0.4)
ALPHA2 GLOB SERPL ELPH-MCNC: 0.7 g/dL (ref 0.4–1.0)
Albumin SerPl Elph-Mcnc: 4.3 g/dL (ref 2.9–4.4)
Albumin/Glob SerPl: 1.7 (ref 0.7–1.7)
B-Globulin SerPl Elph-Mcnc: 1.1 g/dL (ref 0.7–1.3)
Gamma Glob SerPl Elph-Mcnc: 0.6 g/dL (ref 0.4–1.8)
Globulin, Total: 2.6 g/dL (ref 2.2–3.9)
IGM (IMMUNOGLOBULIN M), SRM: 7 mg/dL — AB (ref 20–172)
IgA: 49 mg/dL — ABNORMAL LOW (ref 61–437)
IgG (Immunoglobin G), Serum: 662 mg/dL — ABNORMAL LOW (ref 700–1600)
M Protein SerPl Elph-Mcnc: 0.1 g/dL — ABNORMAL HIGH
TOTAL PROTEIN ELP: 6.9 g/dL (ref 6.0–8.5)

## 2018-07-30 NOTE — Telephone Encounter (Signed)
Called and spoke with patient he states Monday he went to hematologist and they said he calcium levels were high. Patient woke up Tuesday with chest pain he states he thought he was having a heart attack. Tuesday morning went for ultrasound on his spleen and liver. He was asked to take deep breathes but was unable an was asked to go be evaluated at the ED. Ekg performed CT and nothing showed of a heart attack. He was informed that he had fluid around his lungs. Pain returned last night and would like to discuss this with Dr. Damita Dunnings. Patient is afraid this may be a lung infection.

## 2018-07-30 NOTE — Telephone Encounter (Signed)
If you can get more details about his question, I may be able to help him more.  Please let me know what info you can get.  Thanks.

## 2018-07-30 NOTE — Telephone Encounter (Signed)
Spoke with pt and informed him of U/S results. Pt requests to speak with Dr. Vicente Males regarding concerns for his condition. I explained that I will consult with Dr. Vicente Males and then advise.

## 2018-07-30 NOTE — Telephone Encounter (Signed)
D/w pt.  He had trace pleural effusion but no rib fx, etc.  No PNA seen.  D/w pt.  He has pleuritic type pain on the L side.  Reasonable to try up to 1000mg  tylenol at night for pain.  If not relief he can use his rx for oxycodone from the ER.  He'll check his papers for the oxycodone rx.  He'll update me if he doesn't have that- he thought he declined that at the ER. Would avoid nsaids.    He is taking 7.5mg  amlodipine and will update me about his BP.   He'll keep hematology OV as scheduled.  I'll await update from patient.   He agrees with plan.  He thanked me for the call.

## 2018-07-30 NOTE — Telephone Encounter (Signed)
Copied from [place] [id number]. Topic: General - Inquiry >> Jul 30, 2018 12:41 PM Conception Chancy, NT wrote: Reason for CRM: patient is calling and requesting a call back from Dr. Damita Dunnings in regards to a evaluation with the Hematology department.

## 2018-07-30 NOTE — Telephone Encounter (Signed)
-----   Message from Jonathon Bellows, MD sent at 07/28/2018  9:19 AM EDT ----- Sherald Hess inform [facility] abdomen shows only fatty liver, and renal cysts- no evidence of liver cirrhosis and hence low platelet count not related to any liver disease- less likely   C/c Tonia Ghent, MD   Dr Jonathon Bellows MD,MRCP [facility]) Gastroenterology/Hepatology Pager: [phone]

## 2018-07-31 MED ORDER — OXYCODONE HCL 5 MG PO TABS: 2.5000 mg | ORAL_TABLET | Freq: Three times a day (TID) | ORAL | 0 refills | Status: DC | PRN

## 2018-07-31 NOTE — Addendum Note (Signed)
Addended by: Tonia Ghent on: 07/31/2018 01:56 PM   Modules accepted: Orders

## 2018-07-31 NOTE — Telephone Encounter (Signed)
Spoke with pt and informed him that Dr. Vicente Males states pt could stop by the office to speak with him or consult with Dr. Tasia Catchings for further evaluation and advice. Pt states he was able to speak with his PCP who reviewed pt results and agreed that pt should consult with Dr. Tasia Catchings.

## 2018-07-31 NOTE — Telephone Encounter (Signed)
Sent. Thanks.  Had d/w pt last night, see prev phone note if needed.   Have him update [place] next week if needed.

## 2018-07-31 NOTE — Telephone Encounter (Signed)
Pt called to let Dr Damita Dunnings know that he would like a rx for pain medication, and to please send it to:  [id number] - [place], [place]  [place] [address]  Phone: [phone] Fax: [phone]

## 2018-08-09 ENCOUNTER — Encounter: Payer: Self-pay | Admitting: Gastroenterology

## 2018-08-10 ENCOUNTER — Encounter: Payer: Self-pay | Admitting: Oncology

## 2018-08-10 ENCOUNTER — Ambulatory Visit: Payer: BLUE CROSS/BLUE SHIELD

## 2018-08-10 ENCOUNTER — Inpatient Hospital Stay: Payer: BLUE CROSS/BLUE SHIELD

## 2018-08-10 ENCOUNTER — Inpatient Hospital Stay (HOSPITAL_BASED_OUTPATIENT_CLINIC_OR_DEPARTMENT_OTHER): Payer: BLUE CROSS/BLUE SHIELD | Admitting: Oncology

## 2018-08-10 ENCOUNTER — Ambulatory Visit
Admission: RE | Admit: 2018-08-10 | Discharge: 2018-08-10 | Disposition: A | Discharge status: Home / Self Care | Payer: BLUE CROSS/BLUE SHIELD | Source: Ambulatory Visit | Attending: Oncology | Admitting: Oncology

## 2018-08-10 ENCOUNTER — Other Ambulatory Visit: Payer: Self-pay

## 2018-08-10 VITALS — BP 145/97 | HR 96 | Temp 98.5°F | Resp 18 | Wt 203.0 lb

## 2018-08-10 DIAGNOSIS — K760 Fatty (change of) liver, not elsewhere classified: Secondary | ICD-10-CM

## 2018-08-10 DIAGNOSIS — D649 Anemia, unspecified: Secondary | ICD-10-CM

## 2018-08-10 DIAGNOSIS — R5383 Other fatigue: Secondary | ICD-10-CM

## 2018-08-10 DIAGNOSIS — D472 Monoclonal gammopathy: Secondary | ICD-10-CM | POA: Insufficient documentation

## 2018-08-10 DIAGNOSIS — N189 Chronic kidney disease, unspecified: Secondary | ICD-10-CM | POA: Diagnosis not present

## 2018-08-10 DIAGNOSIS — D696 Thrombocytopenia, unspecified: Secondary | ICD-10-CM | POA: Diagnosis not present

## 2018-08-10 DIAGNOSIS — E785 Hyperlipidemia, unspecified: Secondary | ICD-10-CM

## 2018-08-10 DIAGNOSIS — C9000 Multiple myeloma not having achieved remission: Secondary | ICD-10-CM

## 2018-08-10 DIAGNOSIS — Z79899 Other long term (current) drug therapy: Secondary | ICD-10-CM | POA: Diagnosis not present

## 2018-08-10 DIAGNOSIS — M549 Dorsalgia, unspecified: Secondary | ICD-10-CM | POA: Diagnosis not present

## 2018-08-10 DIAGNOSIS — R948 Abnormal results of function studies of other organs and systems: Secondary | ICD-10-CM | POA: Diagnosis not present

## 2018-08-10 DIAGNOSIS — I129 Hypertensive chronic kidney disease with stage 1 through stage 4 chronic kidney disease, or unspecified chronic kidney disease: Secondary | ICD-10-CM

## 2018-08-10 LAB — LACTATE DEHYDROGENASE: LDH: 164 U/L (ref 98–192)

## 2018-08-10 IMAGING — CR DG BONE SURVEY MET
1 series · 8 of 8 positions shown · non-contrast
Comparison: Chest x-ray [DATE]

CLINICAL DATA: MGUS.  Anemia, thrombocytopenia.  Back pain.

EXAM:
METASTATIC BONE SURVEY

[Series 1: dg bone survey met · 0.14mm/px · 8 of 22 slices shown]
[im 1/22]
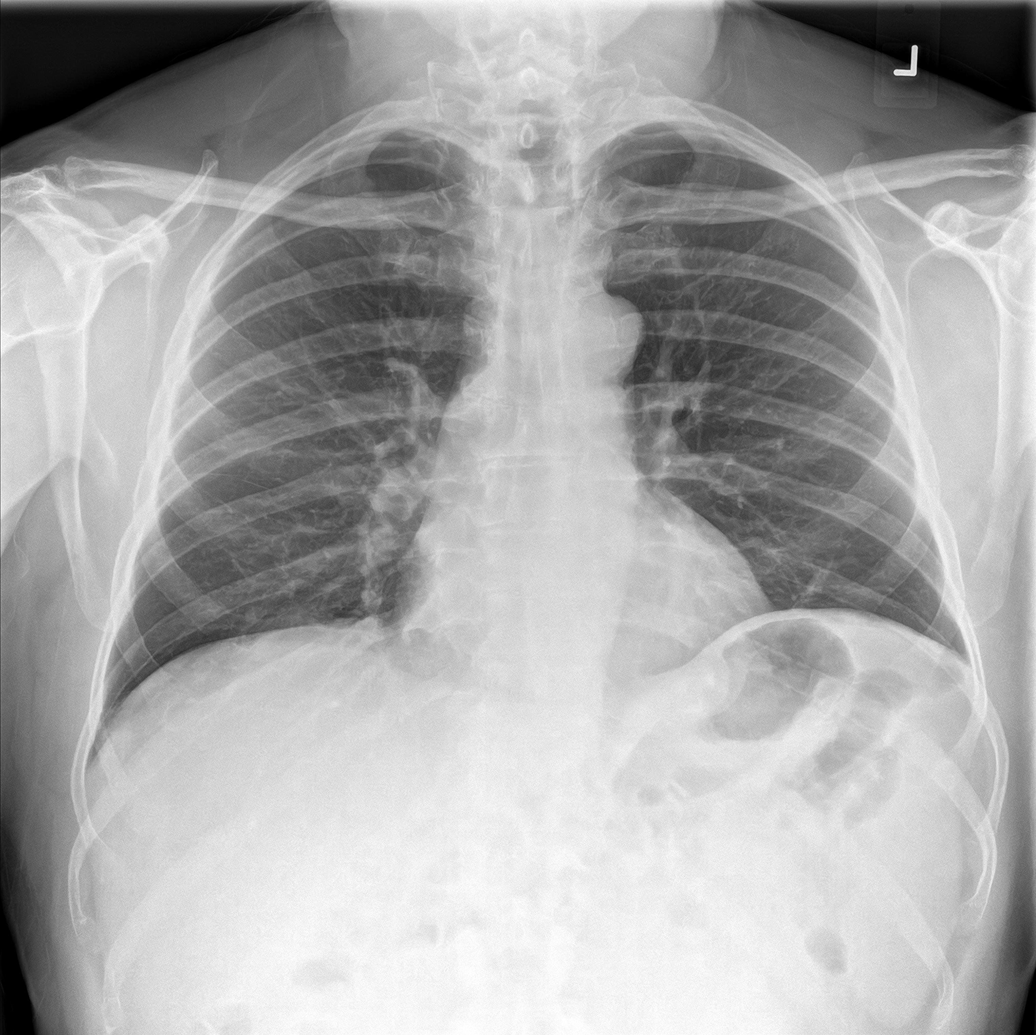
[im 4/22]
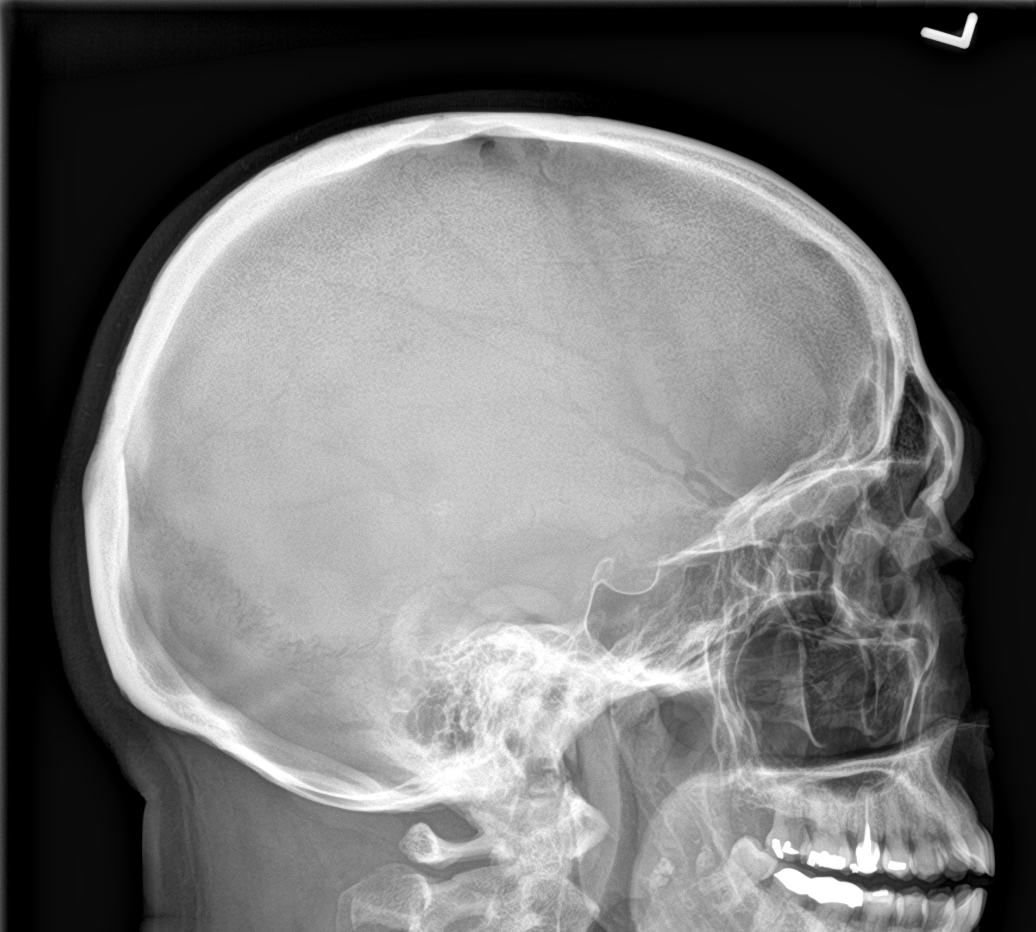
[im 7/22]
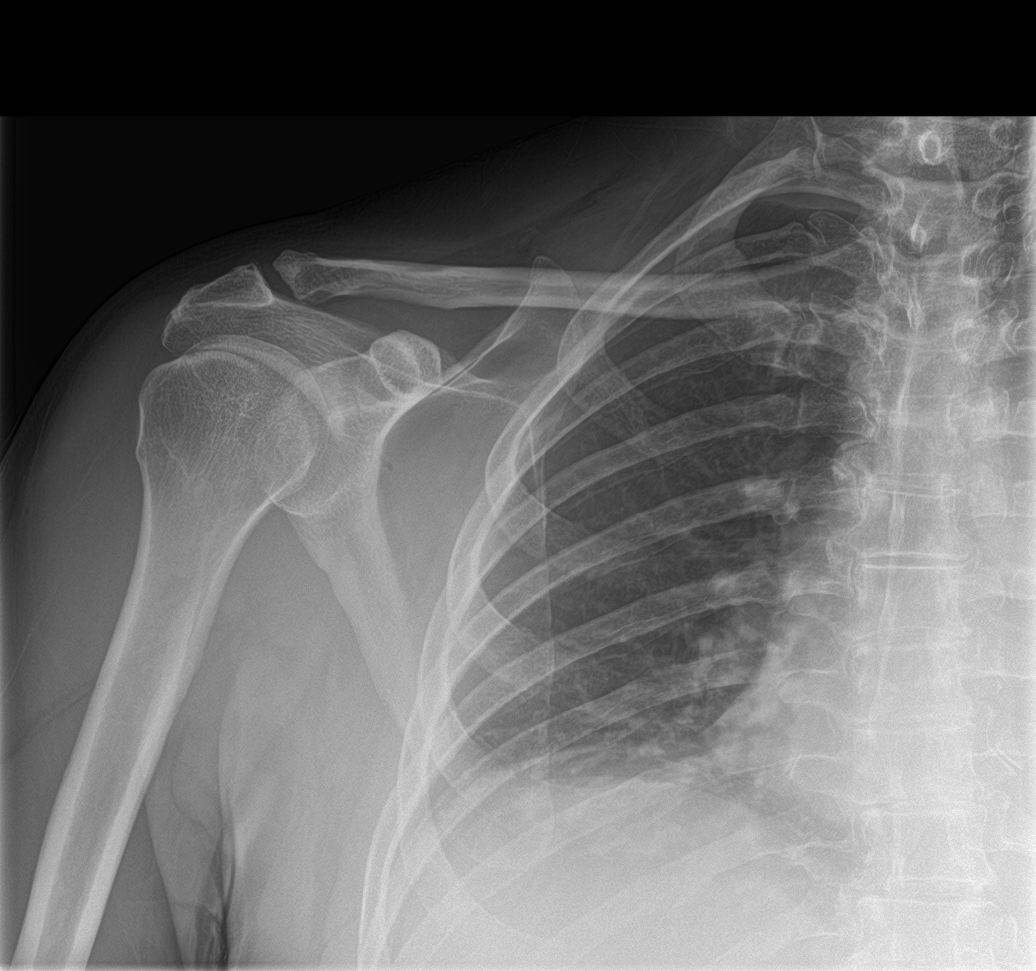
[im 10/22]
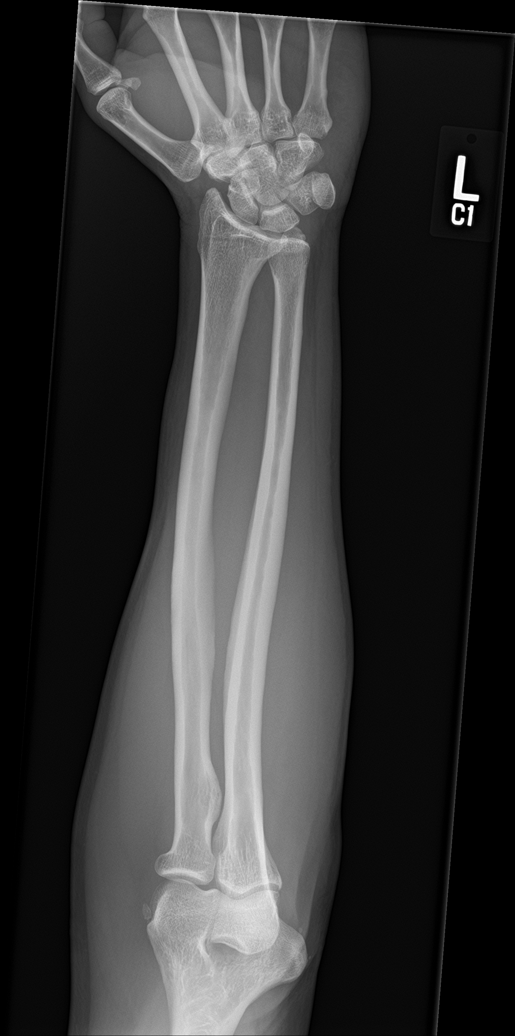
[im 13/22]
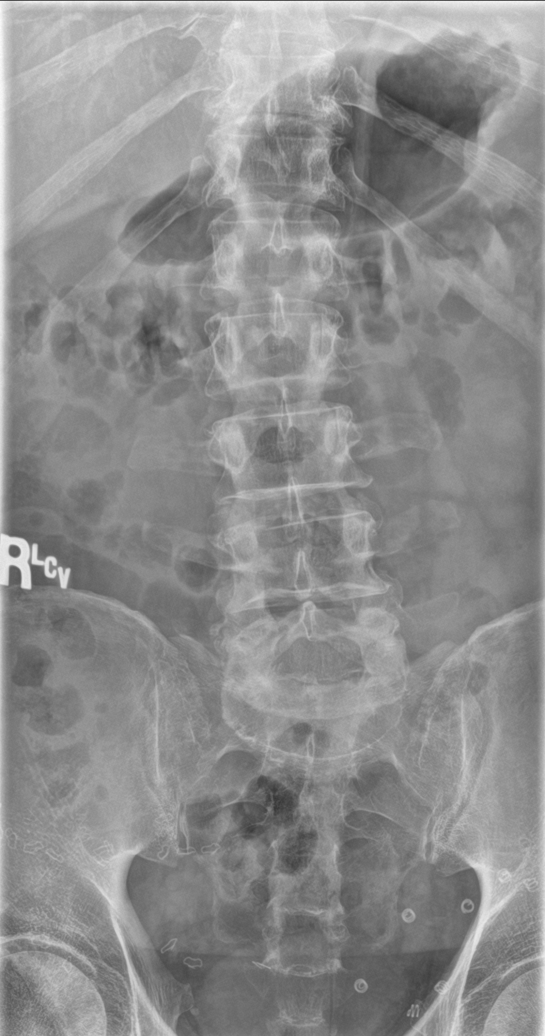
[im 16/22]
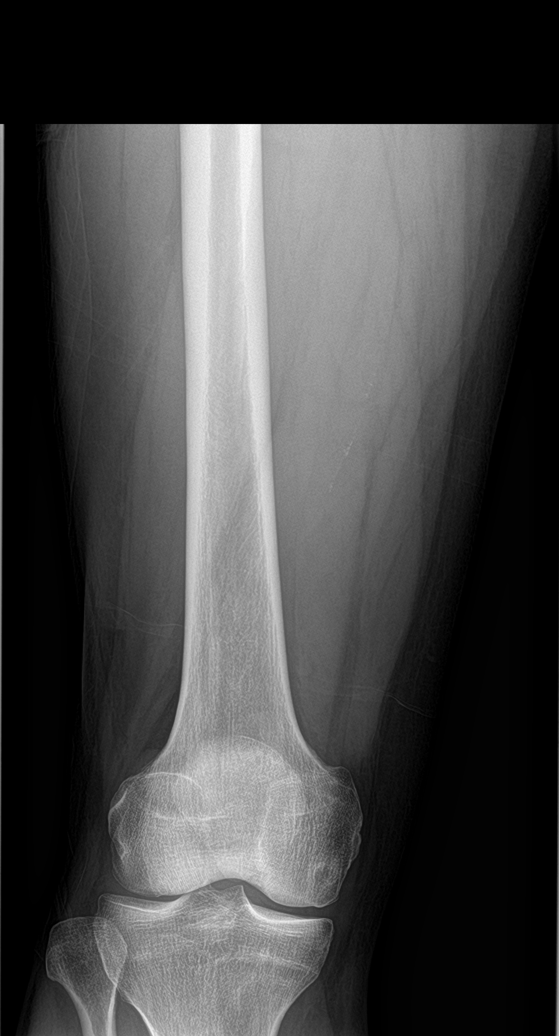
[im 19/22]
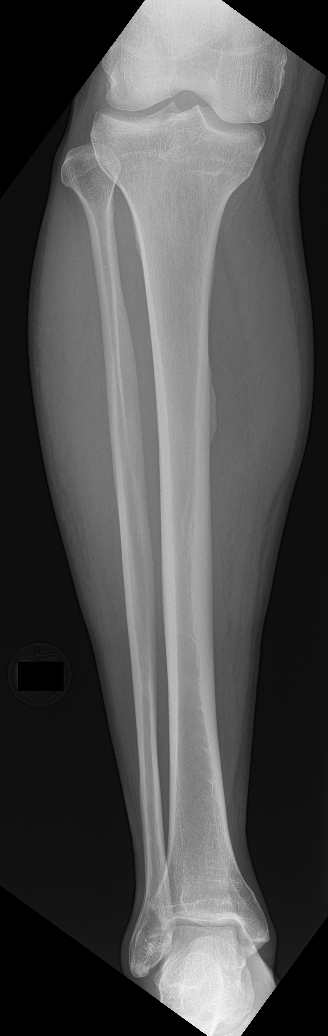
[im 22/22]
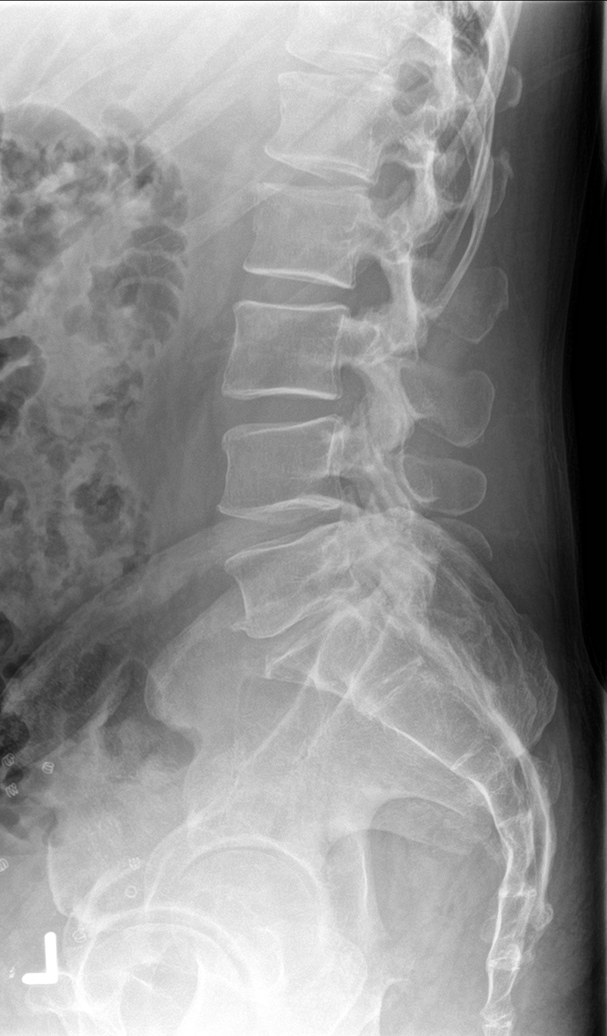

[8 of 8 positions shown; findings below may reference images not displayed]

FINDINGS: Heart is normal size.  Lungs are clear.  No effusions.

Degenerative disc disease at C5-6 and C6-7 with disc space narrowing
and spurring. Mild diffuse degenerative facet disease in the
cervical spine. Degenerative disc disease in the lower thoracic and
lower lumbar spine. Degenerative facet disease in the mid and lower
lumbar spine. No vertebral fracture or focal lytic lesion.

Possible small lucent lesions within the mid shafts of the humeri
bilaterally.

Degenerative changes in the hip joints bilaterally with joint space
narrowing and spurring.
IMPRESSION: Question small lucent lesions within the mid shafts of the humeri
bilaterally. Otherwise, no suspicious focal lytic lesions or acute
bony abnormality.

## 2018-08-10 MED ORDER — VITAMIN B-12 1000 MCG PO TABS: 1000.0000 ug | ORAL_TABLET | Freq: Every day | ORAL | 3 refills | Status: DC

## 2018-08-10 NOTE — Progress Notes (Signed)
Patient here for follow. Pt states that his chest was popping with every breath, but it has now resolved.

## 2018-08-11 LAB — BETA 2 MICROGLOBULIN, SERUM: Beta-2 Microglobulin: 5 mg/L — ABNORMAL HIGH (ref 0.6–2.4)

## 2018-08-11 LAB — KAPPA/LAMBDA LIGHT CHAINS
Kappa free light chain: 10183 mg/L — ABNORMAL HIGH (ref 3.3–19.4)
Kappa, lambda light chain ratio: 1414.31 — ABNORMAL HIGH (ref 0.26–1.65)
Lambda free light chains: 7.2 mg/L (ref 5.7–26.3)

## 2018-08-11 NOTE — Progress Notes (Signed)
Hematology/Oncology Consult note [facility] Telephone:([phone] [phone]   Patient Care Team: Tonia Ghent, MD as PCP - General (Family Medicine)  REFERRING PROVIDER: Napoleon Form CHIEF COMPLAINTS/REASON FOR VISIT:  Evaluation of anemia and thrombocytopenia  HISTORY OF PRESENTING ILLNESS:  Luis Burke is a  62 y.o.  male with PMH listed below who was referred to me for evaluation of anemia and thrombocytopenia Reviewed patient's recent labs, 07/06/2018 labs revealed anemia with hemoglobin of hemoglobin 11, platelet counts 100. Normal total white count and normal differential. Reviewed patient's previous labs, anemia is chronic onset , duration is since August 2019. In August 2019, he has a hemoglobin of 11.5 which decreased to 10.8 in September.  No aggravating or improving factors.  Associated signs and symptoms: Patient reports feeling fatigue.  Denies SOB with exertion.  Denies weight loss, easy bruising, hematochezia, hemoptysis, hematuria. Context: History of GI bleeding: Denies               History of Chronic kidney disease yes               History of autoimmune disease denies denies               History of hemolytic anemia.                Last colonoscopy: 07/20/2018 upper and lower endoscopy showed esophagitis, normal examined duodenum, 2 polyps removed in the ascending colon, resected and retrieved.  Otherwise normal.               He drinks alcohol, usually a few beers during the weekends.  Denies any smoking history.   Intended weight loss 3 pounds for the past 3 months.  INTERVAL HISTORY Luis Burke is a 62 y.o. male who has above history reviewed by me today presents for follow up visit for management of chronic anemia and thrombocytopenia. During the interval patient has had lab work-up done.  He presents to discuss lab results and future management plan. No new complaints.   Review of Systems  Constitutional:  Negative for chills, fever, malaise/fatigue and weight loss.  HENT: Negative for nosebleeds and sore throat.   Eyes: Negative for double vision, photophobia and redness.  Respiratory: Negative for cough, shortness of breath and wheezing.   Cardiovascular: Negative for chest pain, palpitations and orthopnea.  Gastrointestinal: Negative for abdominal pain, blood in stool, nausea and vomiting.  Genitourinary: Negative for dysuria.  Musculoskeletal: Negative for back pain, myalgias and neck pain.  Skin: Negative for itching and rash.  Neurological: Negative for dizziness, tingling and tremors.  Endo/Heme/Allergies: Negative for environmental allergies. Does not bruise/bleed easily.  Psychiatric/Behavioral: Negative for depression.    MEDICAL HISTORY:  Past Medical History:  Diagnosis Date  . Fatty liver    on u/s 07/2018  . Hyperlipidemia 03/2004  . Hypertension 1994  . Snores     SURGICAL HISTORY: Past Surgical History:  Procedure Laterality Date  . CATARACT EXTRACTION W/PHACO Right 03/04/2018   Procedure: CATARACT EXTRACTION PHACO AND INTRAOCULAR LENS PLACEMENT ([place])  RIGHT TORIC;  Surgeon: Leandrew Koyanagi, MD;  Location: [place];  Service: Ophthalmology;  Laterality: Right;  Per [place] no Toric Lens 1:45 5.3  . COLONOSCOPY WITH PROPOFOL N/A 07/20/2018   Procedure: COLONOSCOPY WITH PROPOFOL;  Surgeon: Jonathon Bellows, MD;  Location: [facility] ENDOSCOPY;  Service: Gastroenterology;  Laterality: N/A;  . ESOPHAGOGASTRODUODENOSCOPY (EGD) WITH PROPOFOL N/A 07/20/2018   Procedure: ESOPHAGOGASTRODUODENOSCOPY (EGD) WITH PROPOFOL;  Surgeon: Jonathon Bellows,  MD;  Location: [facility] ENDOSCOPY;  Service: Gastroenterology;  Laterality: N/A;  . EYE SURGERY Right   . HERNIA REPAIR    . L Lap herniorraphy  04/2000  . Left wrist ganglionectomy      SOCIAL HISTORY: Social History   Socioeconomic History  . Marital status: Single    Spouse name: Not on file  . Number of children: 1  . Years of  education: Not on file  . Highest education level: Not on file  Occupational History  . Occupation: [employer]  Social Needs  . Financial resource strain: Not on file  . Food insecurity:    Worry: Not on file    Inability: Not on file  . Transportation needs:    Medical: Not on file    Non-medical: Not on file  Tobacco Use  . Smoking status: Never Smoker  . Smokeless tobacco: Never Used  . Tobacco comment: Occassionally  Substance and Sexual Activity  . Alcohol use: Not Currently    Alcohol/week: 3.0 standard drinks    Types: 3 Cans of beer per week  . Drug use: No  . Sexual activity: Not on file  Lifestyle  . Physical activity:    Days per week: Not on file    Minutes per session: Not on file  . Stress: Not on file  Relationships  . Social connections:    Talks on phone: Not on file    Gets together: Not on file    Attends religious service: Not on file    Active member of club or organization: Not on file    Attends meetings of clubs or organizations: Not on file    Relationship status: Not on file  . Intimate partner violence:    Fear of current or ex partner: Not on file    Emotionally abused: Not on file    Physically abused: Not on file    Forced sexual activity: Not on file  Other Topics Concern  . Not on file  Social History Narrative   Divorced Dec 31, 2007, dating as of 12-30-17   His daughter died 4 days after giving birth to the patient's granddaughter   Has joint custody of his dead daughter's child   Works at [employer] is [employer]    FAMILY HISTORY: Family History  Problem Relation Age of Onset  . Hypertension Mother   . Diabetes Mother   . Heart disease Mother        CAD  . Dementia Mother   . Hypertension Father   . Heart disease Father        MI 02/03  . Colon cancer Father   . Hypertension Sister   . Hypertension Sister   . Hypertension Sister   . Prostate cancer Neg Hx     ALLERGIES:  is allergic to tadalafil.  MEDICATIONS:    Current Outpatient Medications  Medication Sig Dispense Refill  . acetaminophen (TYLENOL) 325 MG tablet Take 650 mg by mouth every 6 (six) hours as needed.    Marland Kitchen amLODipine (NORVASC) 5 MG tablet Take 1.5-2 tablets (7.5-10 mg total) by mouth daily. (Patient taking differently: Take 5 mg by mouth daily. )    . co-enzyme Q-10 50 MG capsule Take 50 mg by mouth daily.    . metoprolol succinate (TOPROL-XL) 50 MG 24 hr tablet Take with or immediately following a meal. (Patient taking differently: Take 50 mg by mouth daily. Take with or immediately following a meal.) 90 tablet 0  . oxyCODONE (  ROXICODONE) 5 MG immediate release tablet Take 0.5-1 tablets (2.5-5 mg total) by mouth 3 (three) times daily as needed (sedation caution). 15 tablet 0  . vitamin B-12 (CYANOCOBALAMIN) 1000 MCG tablet Take 1 tablet (1,000 mcg total) by mouth daily. 30 tablet 3   No current facility-administered medications for this visit.      PHYSICAL EXAMINATION: ECOG PERFORMANCE STATUS: 0 - Asymptomatic Vitals:   08/10/18 1426  BP: (!) 145/97  Pulse: 96  Resp: 18  Temp: 98.5 F (36.9 C)   Filed Weights   08/10/18 1426  Weight: 203 lb (92.1 kg)    Physical Exam  Constitutional: He is oriented to person, place, and time. No distress.  HENT:  Head: Normocephalic and atraumatic.  Mouth/Throat: Oropharynx is clear and moist.  Eyes: Pupils are equal, round, and reactive to light. EOM are normal. No scleral icterus.  Neck: Normal range of motion. Neck supple.  Cardiovascular: Normal rate, regular rhythm and normal heart sounds.  Pulmonary/Chest: Effort normal. No respiratory distress. He has no wheezes.  Abdominal: Soft. Bowel sounds are normal. He exhibits no distension and no mass. There is no tenderness.  Musculoskeletal: Normal range of motion. He exhibits no edema or deformity.  Neurological: He is alert and oriented to person, place, and time. No cranial nerve deficit. Coordination normal.  Skin: Skin is warm  and dry. No rash noted. No erythema.  Psychiatric: He has a normal mood and affect. His behavior is normal. Thought content normal.     LABORATORY DATA:  I have reviewed the data as listed Lab Results  Component Value Date   WBC 5.2 07/28/2018   HGB 10.2 (L) 07/28/2018   HCT 29.9 (L) 07/28/2018   MCV 95.2 07/28/2018   PLT 103 (L) 07/28/2018   Recent Labs    06/09/18 1104 06/16/18 0828 07/06/18 0851 07/27/18 1034 07/28/18 0845  NA 141 142 141 143 143  K 4.2 4.2 4.7 4.3 3.9  CL 105 105 106 107 109  CO2 31 28 28 26 26   GLUCOSE 108* 116* 120* 105* 112*  BUN 27* 28* 25* 26* 23  CREATININE 1.59* 1.59* 1.53* 1.51* 1.53*  CALCIUM 10.3 10.0 10.2 10.4* 10.1  GFRNONAA  --   --   --  48* 47*  GFRAA  --   --   --  55* 55*  PROT 7.4 7.1  --  7.7  --   ALBUMIN 4.8 4.6  --  4.9  --   AST 22 19  --  22  --   ALT 28 23  --  22  --   ALKPHOS 51 55  --  56  --   BILITOT 0.6 0.4  --  0.7  --    Iron/TIBC/Ferritin/ %Sat    Component Value Date/Time   IRON 74 07/01/2018 0944   TIBC 250 07/01/2018 0944   FERRITIN 357 07/01/2018 0944   IRONPCTSAT 30 07/01/2018 0944        ASSESSMENT & PLAN:  1. Anemia, unspecified type   2. Thrombocytopenia ([place])   3. MGUS (monoclonal gammopathy of unknown significance)   4. 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  He has normocytic anemia, smear showed rare metas.  Platelet appears decreased.  Polychromasia present.  Platelet varying size. Reticulocytes showed normal absolute reticulocyte, immature reticulocyte fraction 17.6. Negative hepatitis panel, normal TSH Chronic renal impairment with creatinine of 1.51.  Borderline hypercalcemia 10.4 with repeat labs showed 10.1. Multiple myeloma  panel showed decreased IgG at 662, decreased IgA 49, decreased IgM level of 7 M spike 0.1 g/dL, immunofixation showed presence of monoclonal free kappa light chains. Ultrasound abdomen showed fatty infiltration of the liver  I  discussed with patient about above lab work-up.  Immunofixation of the serum protein electrophoresis showed monoclonal free kappa light chains.  I am concerned that he may have plasma neoplasm, such as light chain myeloma given that he has anemia, chronic renal insufficiency, hypercalcemia. I would like to obtain additional work-up with free light chain ratio, UPEP/immunofixation/light chain.  Beta microglobulin Obtain skeletal survey. Patient was educated to avoid NSAIDs Discussed with patient that depending on the above results, I may need to proceed with a bone marrow biopsy, and/or PET scan.  Labs reviewed, light chain ratio showed extremely high kappa lambda light chain ratio at 1414.31.  We will obtain bone marrow biopsy.  Also obtain PET scan.  Orders Placed This Encounter  Procedures  . DG Bone Survey Met    Standing Status:   Future    Number of Occurrences:   1    Standing Expiration Date:   10/08/2019    Order Specific Question:   Reason for Exam (SYMPTOM  OR DIAGNOSIS REQUIRED)    Answer:   MGUS, anemia, thrombocytopenia, back pain    Order Specific Question:   Preferred imaging location?    Answer:   [facility]  . Flow cytometry panel-leukemia/lymphoma work-up    Standing Status:   Future    Number of Occurrences:   1    Standing Expiration Date:   08/11/2019  . Kappa/lambda light chains    Standing Status:   Future    Number of Occurrences:   1    Standing Expiration Date:   08/10/2019  . Lactate dehydrogenase    Standing Status:   Future    Number of Occurrences:   1    Standing Expiration Date:   08/11/2019  . Beta 2 microglobulin, serum    Standing Status:   Future    Number of Occurrences:   1    Standing Expiration Date:   08/11/2019  . UPEP/UIFE/Light Chains/TP, 24-Hr Ur    Standing Status:   Future    Standing Expiration Date:   08/11/2019    All questions were answered. The patient knows to call the clinic with any problems questions or  concerns.  Return of visit: follow up 1 week after bone marrow biopsy.    Total face to face encounter time for this patient visit was 25 min. >50% of the time was  spent in counseling and coordination of care.   Earlie Server, MD, PhD Hematology Oncology [facility] Pager- [phone] 08/11/2018

## 2018-08-14 LAB — COMP PANEL: LEUKEMIA/LYMPHOMA

## 2018-08-17 ENCOUNTER — Ambulatory Visit: Admission: RE | Admit: 2018-08-17 | Payer: BLUE CROSS/BLUE SHIELD | Source: Ambulatory Visit

## 2018-08-18 ENCOUNTER — Other Ambulatory Visit: Payer: Self-pay | Admitting: Radiology

## 2018-08-19 ENCOUNTER — Other Ambulatory Visit: Payer: Self-pay

## 2018-08-19 ENCOUNTER — Ambulatory Visit
Admission: RE | Admit: 2018-08-19 | Discharge: 2018-08-19 | Disposition: A | Discharge status: Home / Self Care | Payer: BLUE CROSS/BLUE SHIELD | Source: Ambulatory Visit | Attending: Oncology | Admitting: Oncology

## 2018-08-19 ENCOUNTER — Other Ambulatory Visit (HOSPITAL_COMMUNITY)
Admission: RE | Admit: 2018-08-19 | Discharge: 2018-08-19 | Disposition: A | Discharge status: Home / Self Care | Payer: BLUE CROSS/BLUE SHIELD | Source: Ambulatory Visit | Attending: Oncology | Admitting: Oncology

## 2018-08-19 DIAGNOSIS — Z888 Allergy status to other drugs, medicaments and biological substances status: Secondary | ICD-10-CM | POA: Insufficient documentation

## 2018-08-19 DIAGNOSIS — Z8249 Family history of ischemic heart disease and other diseases of the circulatory system: Secondary | ICD-10-CM | POA: Diagnosis not present

## 2018-08-19 DIAGNOSIS — Z833 Family history of diabetes mellitus: Secondary | ICD-10-CM | POA: Insufficient documentation

## 2018-08-19 DIAGNOSIS — Z961 Presence of intraocular lens: Secondary | ICD-10-CM | POA: Diagnosis not present

## 2018-08-19 DIAGNOSIS — I10 Essential (primary) hypertension: Secondary | ICD-10-CM | POA: Insufficient documentation

## 2018-08-19 DIAGNOSIS — Z800 Family history of malignant neoplasm of digestive organs: Secondary | ICD-10-CM | POA: Insufficient documentation

## 2018-08-19 DIAGNOSIS — K760 Fatty (change of) liver, not elsewhere classified: Secondary | ICD-10-CM | POA: Diagnosis not present

## 2018-08-19 DIAGNOSIS — Z79899 Other long term (current) drug therapy: Secondary | ICD-10-CM | POA: Diagnosis not present

## 2018-08-19 DIAGNOSIS — C9000 Multiple myeloma not having achieved remission: Secondary | ICD-10-CM

## 2018-08-19 DIAGNOSIS — D472 Monoclonal gammopathy: Secondary | ICD-10-CM

## 2018-08-19 DIAGNOSIS — Z9841 Cataract extraction status, right eye: Secondary | ICD-10-CM | POA: Insufficient documentation

## 2018-08-19 DIAGNOSIS — D4989 Neoplasm of unspecified behavior of other specified sites: Secondary | ICD-10-CM | POA: Diagnosis not present

## 2018-08-19 DIAGNOSIS — D696 Thrombocytopenia, unspecified: Secondary | ICD-10-CM | POA: Insufficient documentation

## 2018-08-19 DIAGNOSIS — D649 Anemia, unspecified: Secondary | ICD-10-CM | POA: Diagnosis not present

## 2018-08-19 DIAGNOSIS — E785 Hyperlipidemia, unspecified: Secondary | ICD-10-CM | POA: Insufficient documentation

## 2018-08-19 HISTORY — DX: Anemia, unspecified: D64.9

## 2018-08-19 LAB — CBC WITH DIFFERENTIAL/PLATELET
Abs Immature Granulocytes: 0.15 10*3/uL — ABNORMAL HIGH (ref 0.00–0.07)
BASOS ABS: 0 10*3/uL (ref 0.0–0.1)
Basophils Relative: 0 %
Eosinophils Absolute: 0.1 10*3/uL (ref 0.0–0.5)
Eosinophils Relative: 2 %
HEMATOCRIT: 28.1 % — AB (ref 39.0–52.0)
HEMOGLOBIN: 9.6 g/dL — AB (ref 13.0–17.0)
IMMATURE GRANULOCYTES: 3 %
LYMPHS ABS: 1.4 10*3/uL (ref 0.7–4.0)
LYMPHS PCT: 31 %
MCH: 32.5 pg (ref 26.0–34.0)
MCHC: 34.2 g/dL (ref 30.0–36.0)
MCV: 95.3 fL (ref 80.0–100.0)
MONOS PCT: 11 %
Monocytes Absolute: 0.5 10*3/uL (ref 0.1–1.0)
NEUTROS ABS: 2.4 10*3/uL (ref 1.7–7.7)
NEUTROS PCT: 53 %
NRBC: 0 % (ref 0.0–0.2)
Platelets: 87 10*3/uL — ABNORMAL LOW (ref 150–400)
RBC: 2.95 MIL/uL — ABNORMAL LOW (ref 4.22–5.81)
RDW: 14.5 % (ref 11.5–15.5)
WBC: 4.5 10*3/uL (ref 4.0–10.5)

## 2018-08-19 LAB — PROTIME-INR
INR: 0.9
PROTHROMBIN TIME: 12.1 s (ref 11.4–15.2)

## 2018-08-19 IMAGING — CT CT BIOPSY AND ASPIRATION BONE MARROW
1 of 2 series · 9 of 14 positions shown, 12 images · non-contrast
Comparison: none

INDICATION: Anemia and thrombocytopenia of uncertain etiology. Please perform
CT-guided bone marrow biopsy for tissue diagnostic purposes.

[Series 2: i-spiral 5.0 b30f · axial · 0.51mm/px · z∈[+805,+875]mm · 9 of 26 slices shown, 12 images]
[im 3/26  soft-tissue]
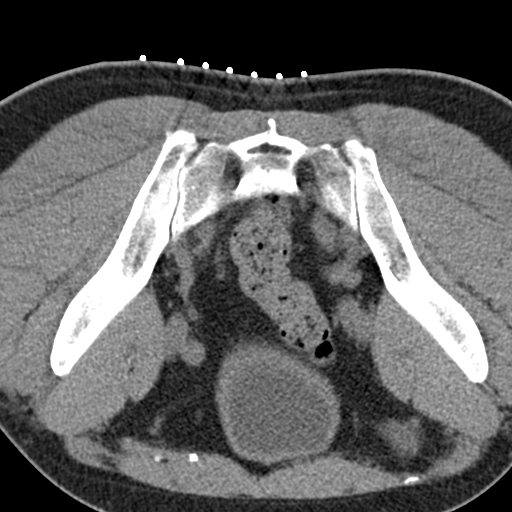
[im 3/26  bone]
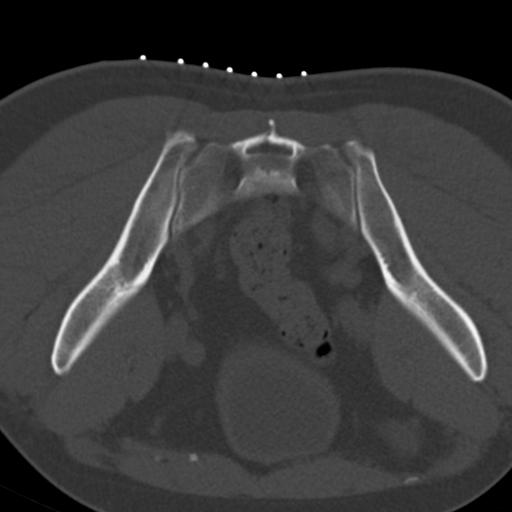
[im 6/26  bone]
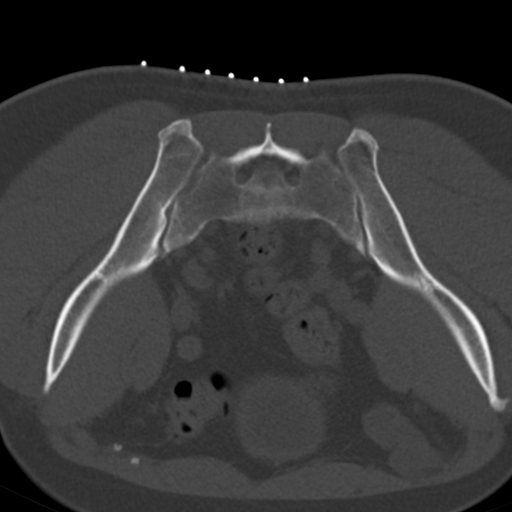
[im 8/26  bone]
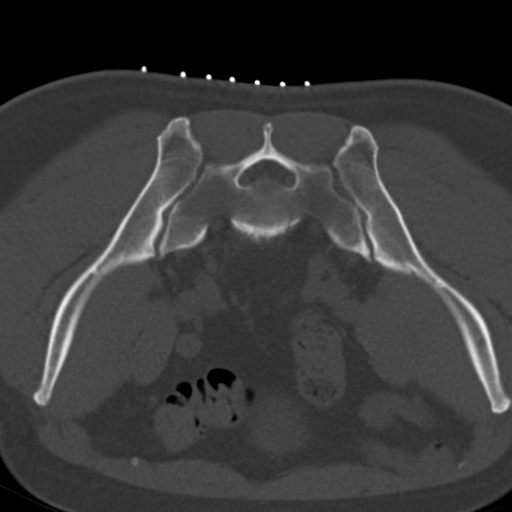
[im 11/26  bone]
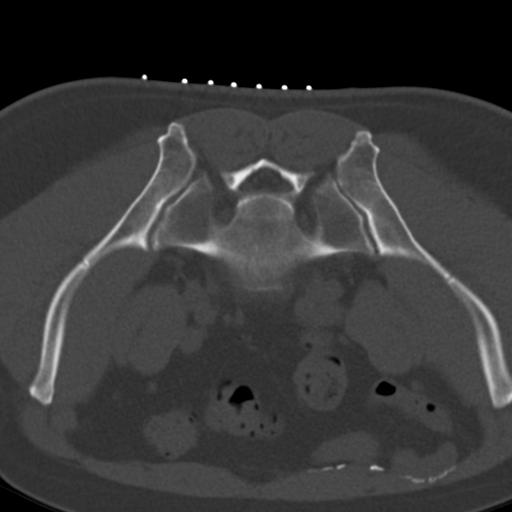
[im 13/26  soft-tissue]
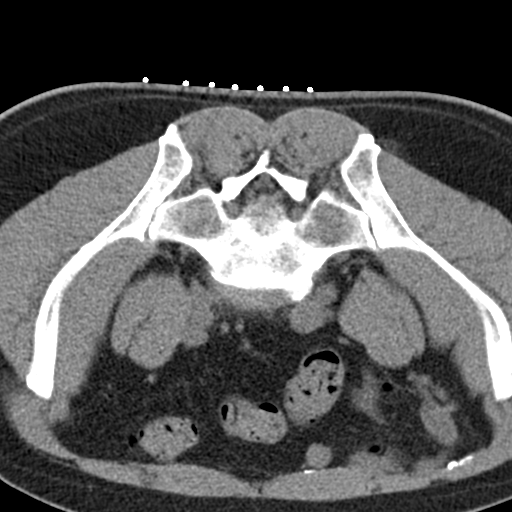
[im 13/26  bone]
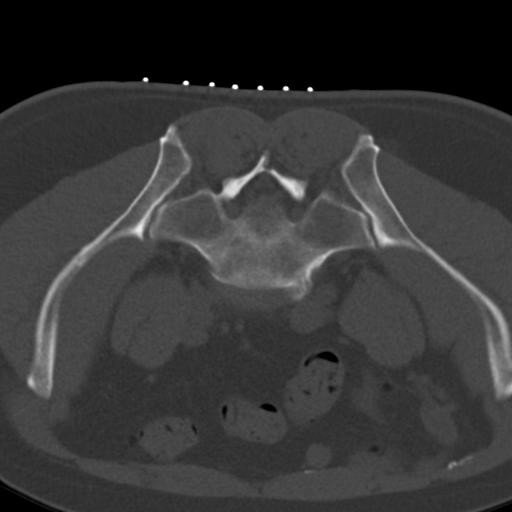
[im 16/26  bone]
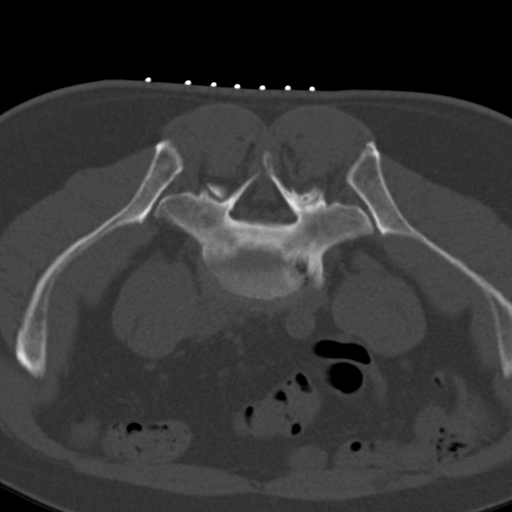
[im 18/26  bone]
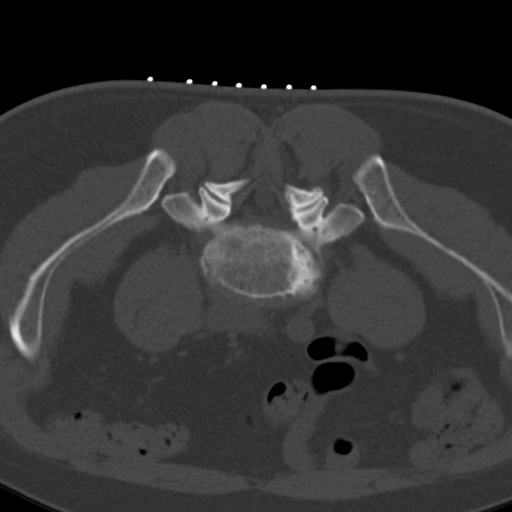
[im 21/26  bone]
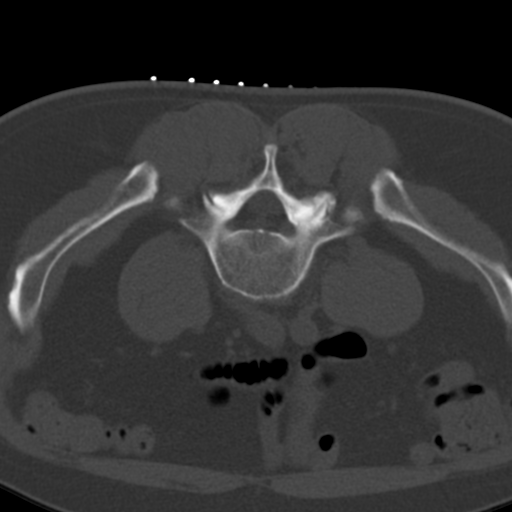
[im 23/26  soft-tissue]
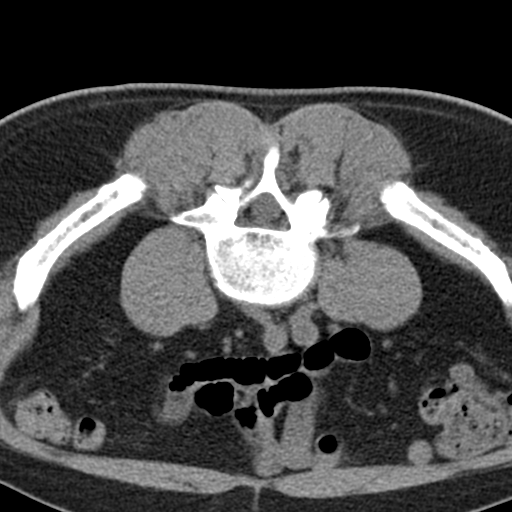
[im 23/26  bone]
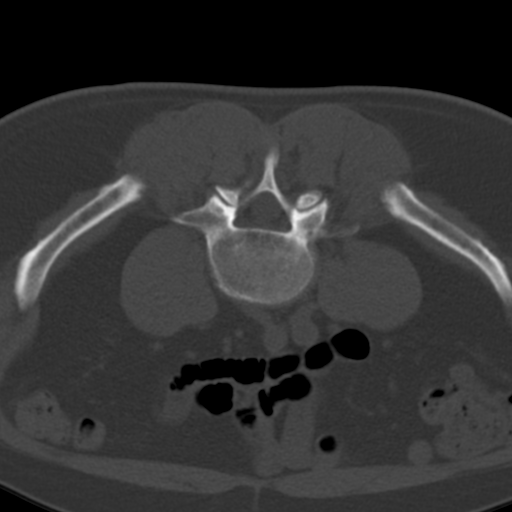

[9 of 14 positions shown; findings below may reference images not displayed]

EXAM:
CT-GUIDED BONE MARROW BIOPSY AND ASPIRATION

MEDICATIONS:
None

ANESTHESIA/SEDATION:
Moderate (conscious) sedation was employed during this procedure. A
total of Versed 3.5 mg and Fentanyl 100 mcg was administered
intravenously.

Moderate Sedation Time: 15 minutes. The patient's level of
consciousness and vital signs were monitored continuously by
radiology nursing throughout the procedure under my direct
supervision.

COMPLICATIONS:
None immediate.

PROCEDURE:
Informed consent was obtained from the patient following an
explanation of the procedure, risks, benefits and alternatives. The
patient understands, agrees and consents for the procedure. All
questions were addressed. A time out was performed prior to the
initiation of the procedure. The patient was positioned prone and
non-contrast localization CT was performed of the pelvis to
demonstrate the iliac marrow spaces. The operative site was prepped
and draped in the usual sterile fashion.

Under sterile conditions and local anesthesia, a 22 gauge spinal
needle was utilized for procedural planning. Next, an 11 gauge
coaxial bone biopsy needle was advanced into the left iliac marrow
space. Needle position was confirmed with CT imaging. Initially,
bone marrow aspiration was performed. Next, a bone marrow biopsy was
obtained with the 11 gauge outer bone marrow device. Samples were
prepared with the cytotechnologist and deemed adequate. The needle
was removed intact. Hemostasis was obtained with compression and a
dressing was placed. The patient tolerated the procedure well
without immediate post procedural complication.
IMPRESSION: Successful CT guided left iliac bone marrow aspiration and core
biopsy.

## 2018-08-19 MED ORDER — FENTANYL CITRATE (PF) 100 MCG/2ML IJ SOLN
INTRAMUSCULAR | Status: AC | PRN
  Administered 2018-08-19: 25 ug via INTRAVENOUS
  Administered 2018-08-19: 50 ug via INTRAVENOUS
  Administered 2018-08-19: 25 ug via INTRAVENOUS

## 2018-08-19 MED ORDER — FENTANYL CITRATE (PF) 100 MCG/2ML IJ SOLN
INTRAMUSCULAR | Status: AC
  Filled 2018-08-19: qty 4

## 2018-08-19 MED ORDER — MIDAZOLAM HCL 5 MG/5ML IJ SOLN
INTRAMUSCULAR | Status: AC | PRN
  Administered 2018-08-19 (×2): 1 mg via INTRAVENOUS
  Administered 2018-08-19: 0.5 mg via INTRAVENOUS
  Administered 2018-08-19: 1 mg via INTRAVENOUS

## 2018-08-19 MED ORDER — HEPARIN SOD (PORK) LOCK FLUSH 100 UNIT/ML IV SOLN
INTRAVENOUS | Status: AC
  Filled 2018-08-19: qty 5

## 2018-08-19 MED ORDER — SODIUM CHLORIDE 0.9 % IV SOLN
INTRAVENOUS | Status: DC
  Administered 2018-08-19: 08:00:00 via INTRAVENOUS

## 2018-08-19 MED ORDER — MIDAZOLAM HCL 5 MG/5ML IJ SOLN
INTRAMUSCULAR | Status: AC
  Filled 2018-08-19: qty 5

## 2018-08-19 NOTE — Procedures (Signed)
Pre-procedure Diagnosis: Anemia and thrombocyopenia Post-procedure Diagnosis: Same  Technically successful CT guided bone marrow aspiration and biopsy of left iliac crest.   Complications: None Immediate  EBL: None  SignedSandi Mariscal Pager: [phone] 08/19/2018, 9:18 AM

## 2018-08-19 NOTE — Consult Note (Signed)
Chief Complaint: Anemia and thrombocytopenia.  Referring Physician(s): Yu,Zhou  Patient Status: [facility] - Out-pt  History of Present Illness: Luis Burke is a 62 y.o. male with past no history significant for hypertension, hyperlipidemia and hepatic steatosis presents to the interventional radiology department for CT-guided bone marrow biopsy for work-up of anemia and thrombocytopenia.  The patient is accompanied by his friend though serves his own historian.  Patient reports persistent low back pain which is a chronic problem and not acutely worsen.  Patient is otherwise without complaint.  Specifically, no fever or chills.  No chest pain or shortness of breath.  Past Medical History:  Diagnosis Date  . Anemia   . Fatty liver    on u/s 07/2018  . Hyperlipidemia 03/2004  . Hypertension 1994  . Snores     Past Surgical History:  Procedure Laterality Date  . CATARACT EXTRACTION W/PHACO Right 03/04/2018   Procedure: CATARACT EXTRACTION PHACO AND INTRAOCULAR LENS PLACEMENT ([place])  RIGHT TORIC;  Surgeon: Leandrew Koyanagi, MD;  Location: [place];  Service: Ophthalmology;  Laterality: Right;  Per [place] no Toric Lens 1:45 5.3  . COLONOSCOPY WITH PROPOFOL N/A 07/20/2018   Procedure: COLONOSCOPY WITH PROPOFOL;  Surgeon: Jonathon Bellows, MD;  Location: [facility] ENDOSCOPY;  Service: Gastroenterology;  Laterality: N/A;  . ESOPHAGOGASTRODUODENOSCOPY (EGD) WITH PROPOFOL N/A 07/20/2018   Procedure: ESOPHAGOGASTRODUODENOSCOPY (EGD) WITH PROPOFOL;  Surgeon: Jonathon Bellows, MD;  Location: [facility] ENDOSCOPY;  Service: Gastroenterology;  Laterality: N/A;  . EYE SURGERY Right   . HERNIA REPAIR    . L Lap herniorraphy  04/2000  . Left wrist ganglionectomy      Allergies: Tadalafil  Medications: Prior to Admission medications   Medication Sig Start Date End Date Taking? Authorizing Provider  acetaminophen (TYLENOL) 325 MG tablet Take 650 mg by mouth every 6 (six) hours as needed.   Yes  [provider]  amLODipine (NORVASC) 5 MG tablet Take 1.5-2 tablets (7.5-10 mg total) by mouth daily. Patient taking differently: Take 5 mg by mouth daily.  07/28/18  Yes Tonia Ghent, MD  co-enzyme Q-10 50 MG capsule Take 50 mg by mouth daily.   Yes [provider]  metoprolol succinate (TOPROL-XL) 50 MG 24 hr tablet Take with or immediately following a meal. Patient taking differently: Take 50 mg by mouth daily. Take with or immediately following a meal. 06/19/18  Yes Tonia Ghent, MD  vitamin B-12 (CYANOCOBALAMIN) 1000 MCG tablet Take 1 tablet (1,000 mcg total) by mouth daily. 08/10/18  Yes Earlie Server, MD  oxyCODONE (ROXICODONE) 5 MG immediate release tablet Take 0.5-1 tablets (2.5-5 mg total) by mouth 3 (three) times daily as needed (sedation caution). 07/31/18   Tonia Ghent, MD     Family History  Problem Relation Age of Onset  . Hypertension Mother   . Diabetes Mother   . Heart disease Mother        CAD  . Dementia Mother   . Hypertension Father   . Heart disease Father        MI 02/03  . Colon cancer Father   . Hypertension Sister   . Hypertension Sister   . Hypertension Sister   . Prostate cancer Neg Hx     Social History   Socioeconomic History  . Marital status: Single    Spouse name: Not on file  . Number of children: 1  . Years of education: Not on file  . Highest education level: Not on file  Occupational History  .  Occupation: [employer]  Social Needs  . Financial resource strain: Not on file  . Food insecurity:    Worry: Not on file    Inability: Not on file  . Transportation needs:    Medical: Not on file    Non-medical: Not on file  Tobacco Use  . Smoking status: Never Smoker  . Smokeless tobacco: Never Used  . Tobacco comment: Occassionally  Substance and Sexual Activity  . Alcohol use: Not Currently    Alcohol/week: 3.0 standard drinks    Types: 3 Cans of beer per week  . Drug use: No  . Sexual activity: Not on  file  Lifestyle  . Physical activity:    Days per week: Not on file    Minutes per session: Not on file  . Stress: Not on file  Relationships  . Social connections:    Talks on phone: Not on file    Gets together: Not on file    Attends religious service: Not on file    Active member of club or organization: Not on file    Attends meetings of clubs or organizations: Not on file    Relationship status: Not on file  Other Topics Concern  . Not on file  Social History Narrative   Divorced 23-Nov-2007, dating as of 2017/11/22   His daughter died 4 days after giving birth to the patient's granddaughter   Has joint custody of his dead daughter's child   Works at [employer] is [employer] Status: 0 - Asymptomatic  Review of Systems: A 12 point ROS discussed and pertinent positives are indicated in the HPI above.  All other systems are negative.  Review of Systems  Constitutional: Negative for activity change, chills and fever.  Respiratory: Negative.   Cardiovascular: Negative.   Gastrointestinal: Negative.   Musculoskeletal: Positive for back pain.    Vital Signs: BP (!) 183/110   Pulse 82   Temp 97.6 F (36.4 C) (Oral)   Resp 18   Ht 5' 10" (1.778 m)   Wt 92.1 kg   SpO2 100%   BMI 29.13 kg/m   Physical Exam  Constitutional: He appears well-developed and well-nourished.  HENT:  Head: Atraumatic.  Cardiovascular: Regular rhythm.  Pulmonary/Chest: Effort normal and breath sounds normal.  Psychiatric: He has a normal mood and affect. His behavior is normal.  Nursing note reviewed.   Imaging: Ct Angio Chest Pe W And/or Wo Contrast  Result Date: 07/28/2018 CLINICAL DATA:  Constant left anterior chest pain since 2 o'clock this morning. Evaluate for pulmonary embolism. EXAM: CT ANGIOGRAPHY CHEST WITH CONTRAST TECHNIQUE: Multidetector CT imaging of the chest was performed using the standard protocol during bolus administration of intravenous contrast. Multiplanar CT  image reconstructions and MIPs were obtained to evaluate the vascular anatomy. CONTRAST:  19m OMNIPAQUE IOHEXOL 350 MG/ML SOLN COMPARISON:  Chest radiograph-09/27/2009 FINDINGS: Vascular Findings: There is adequate opacification of the pulmonary arterial system with the main pulmonary artery measuring 236 Hounsfield units. There are no discrete filling defects within the pulmonary arterial tree to suggest pulmonary embolism. Normal caliber of the main pulmonary artery. Borderline cardiomegaly.  No pericardial effusion. Normal caliber of the thoracic aorta. No evidence of thoracic aortic dissection or periaortic stranding on this nongated examination. Bovine configuration of the aortic arch. The branch vessels of the aortic arch appear widely patent throughout their imaged course. Note is made of a aortic nipple. Review of the MIP images confirms the above findings. ---------------------------------------------------------------------------------- Nonvascular  Findings: Mediastinum/Lymph Nodes: No bulky mediastinal, hilar or axillary lymphadenopathy. Lungs/Pleura: Minimal dependent subpleural ground-glass atelectasis. Minimal subsegmental atelectasis within the right middle lobe and lingula. Trace left-sided pleural effusion. No focal airspace opacities. No discrete pulmonary nodules. Central pulmonary airways appear widely patent. Upper abdomen: Limited early arterial phase evaluation of the upper abdomen demonstrates mild nodularity of the hepatic contour. Suspected bilateral renal cysts with dominant left-sided parapelvic cyst measuring 2.8 cm in diameter, incompletely imaged. Note is made of a small splenule about the splenic hilum. Musculoskeletal: No acute or aggressive osseous abnormalities. Mild degenerative change at T11-T12 with disc space height loss endplate irregularity anteriorly directed osteophytosis. Regional soft tissues appear normal. Normal appearance of the thyroid gland. IMPRESSION: 1. Trace  left-sided pleural effusion with associated left basilar atelectasis. Otherwise, no acute cardiopulmonary disease. Specifically, no evidence of pulmonary embolism. 2. Borderline cardiomegaly. 3. Mild nodularity of the hepatic contour as could be seen in the setting of early cirrhotic change. Correlation with LFTs could be performed as indicated. Electronically Signed   By: Sandi Mariscal M.D.   On: 07/28/2018 11:00   US Abdomen Complete  Result Date: 07/28/2018 CLINICAL DATA:  Anemia EXAM: ABDOMEN ULTRASOUND COMPLETE COMPARISON:  None. FINDINGS: Gallbladder: No gallstones or wall thickening visualized. No sonographic Murphy sign noted by sonographer. Common bile duct: Diameter: Normal caliber, 4 mm Liver: Increased echotexture compatible with fatty infiltration. No focal abnormality or biliary ductal dilatation. Portal vein is patent on color Doppler imaging with normal direction of blood flow towards the liver. IVC: No abnormality visualized. Pancreas: Visualized portion unremarkable. Spleen: Size and appearance within normal limits. Right Kidney: Length: 14.3 cm. Multiple cysts, the largest 7 cm in the upper pole. No hydronephrosis. Normal echotexture. Left Kidney: Length: 15.3 cm. 2.8 cm upper pole cyst. No hydronephrosis. Abdominal aorta: No aneurysm visualized. Other findings: None. IMPRESSION: Fatty infiltration of the liver. Bilateral renal cysts. No acute findings. Electronically Signed   By: Rolm Baptise M.D.   On: 07/28/2018 08:43   Dg Bone Survey Met  Result Date: 08/11/2018 CLINICAL DATA:  MGUS.  Anemia, thrombocytopenia.  Back pain. EXAM: METASTATIC BONE SURVEY COMPARISON:  Chest x-ray 09/27/2009 FINDINGS: Heart is normal size.  Lungs are clear.  No effusions. Degenerative disc disease at C5-6 and C6-7 with disc space narrowing and spurring. Mild diffuse degenerative facet disease in the cervical spine. Degenerative disc disease in the lower thoracic and lower lumbar spine. Degenerative facet  disease in the mid and lower lumbar spine. No vertebral fracture or focal lytic lesion. Possible small lucent lesions within the mid shafts of the humeri bilaterally. Degenerative changes in the hip joints bilaterally with joint space narrowing and spurring. IMPRESSION: Question small lucent lesions within the mid shafts of the humeri bilaterally. Otherwise, no suspicious focal lytic lesions or acute bony abnormality. Electronically Signed   By: Rolm Baptise M.D.   On: 08/11/2018 08:58    Labs:  CBC: Recent Labs    07/06/18 0851 07/27/18 1034 07/28/18 0845 08/19/18 0753  WBC 5.9 4.4 5.2 4.5  HGB 11.0* 10.1* 10.2* 9.6*  HCT 31.5* 30.2* 29.9* 28.1*  PLT 100.0* 80* 103* 87*    COAGS: Recent Labs    08/19/18 0753  INR 0.90    BMP: Recent Labs    06/16/18 0828 07/06/18 0851 07/27/18 1034 07/28/18 0845  NA 142 141 143 143  K 4.2 4.7 4.3 3.9  CL 105 106 107 109  CO2 _0 GLUCOSE 116* 120* 105* 112*  BUN 28* 25* 26* 23  CALCIUM 10.0 10.2 10.4* 10.1  CREATININE 1.59* 1.53* 1.51* 1.53*  GFRNONAA  --   --  48* 47*  GFRAA  --   --  55* 55*    LIVER FUNCTION TESTS: Recent Labs    06/09/18 1104 06/16/18 0828 07/27/18 1034  BILITOT 0.6 0.4 0.7  AST _0 ALT _1 ALKPHOS 51 55 56  PROT 7.4 7.1 7.7  ALBUMIN 4.8 4.6 4.9    TUMOR MARKERS: No results for input(s): AFPTM, CEA, CA199, CHROMGRNA in the last 8760 hours.  Assessment and Plan:  TAELYN NEMES is a 62 y.o. male with past no history significant for hypertension, hyperlipidemia and hepatic steatosis presents to the interventional radiology department for CT-guided bone marrow biopsy for work-up of anemia and thrombocytopenia.    Risks and benefits of CT-guided bone marrow biopsy and aspiration was discussed with the patient including, but not limited to bleeding, infection, damage to adjacent structures or low yield requiring additional tests.  All of the patient's questions were answered,  patient is agreeable to proceed. Consent signed and in chart.  Thank you for this interesting consult.  I greatly enjoyed meeting Luis Burke and look forward to participating in their care.  A copy of this report was sent to the requesting provider on this date.  Electronically Signed: Sandi Mariscal, MD 08/19/2018, 8:44 AM   I spent a total of 15 Minutes in face to face in clinical consultation, greater than 50% of which was counseling/coordinating care for CT guided BM Bx.

## 2018-08-25 ENCOUNTER — Ambulatory Visit
Admission: RE | Admit: 2018-08-25 | Discharge: 2018-08-25 | Disposition: A | Discharge status: Home / Self Care | Payer: BLUE CROSS/BLUE SHIELD | Source: Ambulatory Visit | Attending: Oncology | Admitting: Oncology

## 2018-08-25 DIAGNOSIS — C9000 Multiple myeloma not having achieved remission: Secondary | ICD-10-CM | POA: Diagnosis not present

## 2018-08-25 DIAGNOSIS — D649 Anemia, unspecified: Secondary | ICD-10-CM | POA: Diagnosis not present

## 2018-08-25 DIAGNOSIS — D696 Thrombocytopenia, unspecified: Secondary | ICD-10-CM | POA: Diagnosis not present

## 2018-08-25 DIAGNOSIS — D472 Monoclonal gammopathy: Secondary | ICD-10-CM | POA: Diagnosis not present

## 2018-08-25 LAB — GLUCOSE, CAPILLARY: GLUCOSE-CAPILLARY: 85 mg/dL (ref 70–99)

## 2018-08-25 IMAGING — CT NM PET IMAGE RESTAGE (PS) WHOLE BODY
1 of 10 series · 2 of 25 positions shown · non-contrast
Comparison: CT chest [DATE]

CLINICAL DATA: Initial treatment strategy for multiple myeloma.

EXAM:
NUCLEAR MEDICINE PET WHOLE BODY
TECHNIQUE: 11.15 mCi F-18 FDG was injected intravenously. Full-ring PET imaging
was performed from the skull base to thigh after the radiotracer. CT
data was obtained and used for attenuation correction and anatomic
localization.
Fasting blood glucose: 85 mg/dl

[Series 3: ct wb 5.0 b30f · axial · 5.0mm · 0.98mm/px · z∈[-966,-324]mm · 2 of 643 slices shown]
[im 215/643  soft-tissue]
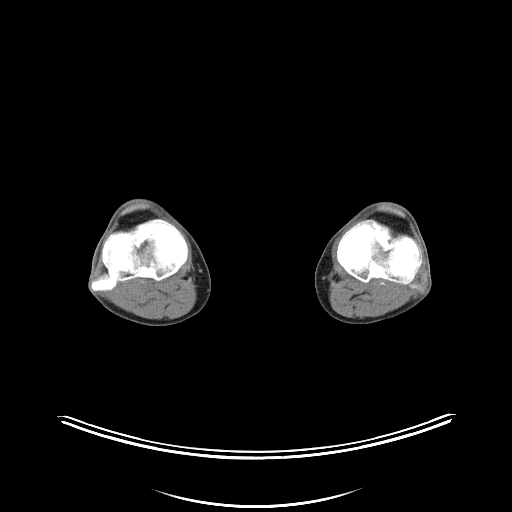
[im 429/643  soft-tissue]
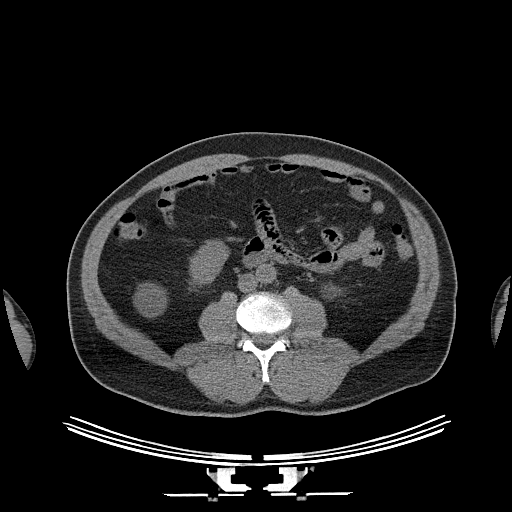

[2 of 25 positions shown; findings below may reference images not displayed]

FINDINGS: Mediastinal blood pool activity: SUV max

HEAD/NECK: No hypermetabolic activity in the scalp. No
hypermetabolic cervical lymph nodes.

Incidental CT findings: none

CHEST: No hypermetabolic mediastinal or hilar nodes. No suspicious
pulmonary nodules on the CT scan.

Incidental CT findings: none

ABDOMEN/PELVIS: No abnormal hypermetabolic activity within the
liver, pancreas, adrenal glands, or spleen. No hypermetabolic lymph
nodes in the abdomen or pelvis.

Incidental CT findings: none

SKELETON: No obvious hypermetabolic bone lesions.

Incidental CT findings: I am suspicious that there are numerous
small myelomatous lesions throughout the spine and sternum. There
may be a few scattered rib lesions also. The left second anterior
rib is fractured and demonstrates callus formation. This could be
pathologic or posttraumatic.

EXTREMITIES: No abnormal hypermetabolic activity in the lower
extremities.

Incidental CT findings: none
IMPRESSION: No definite hypermetabolic bone disease but the CT findings are
highly suspicious for numerous small myelomatous lesions involving
the spine, sternum and scattered ribs.

## 2018-08-25 MED ORDER — FLUDEOXYGLUCOSE F - 18 (FDG) INJECTION
11.1500 | Freq: Once | INTRAVENOUS | Status: AC | PRN
  Administered 2018-08-25: 11.15 via INTRAVENOUS

## 2018-08-26 ENCOUNTER — Other Ambulatory Visit: Payer: Self-pay

## 2018-08-26 ENCOUNTER — Telehealth: Payer: Self-pay

## 2018-08-26 ENCOUNTER — Telehealth: Payer: Self-pay | Admitting: Pharmacy Technician

## 2018-08-26 ENCOUNTER — Telehealth: Payer: Self-pay | Admitting: Pharmacist

## 2018-08-26 ENCOUNTER — Inpatient Hospital Stay: Payer: BLUE CROSS/BLUE SHIELD | Attending: Oncology | Admitting: Oncology

## 2018-08-26 ENCOUNTER — Encounter: Payer: Self-pay | Admitting: Oncology

## 2018-08-26 VITALS — BP 163/94 | HR 84 | Temp 97.0°F | Resp 16 | Wt 203.6 lb

## 2018-08-26 DIAGNOSIS — I129 Hypertensive chronic kidney disease with stage 1 through stage 4 chronic kidney disease, or unspecified chronic kidney disease: Secondary | ICD-10-CM

## 2018-08-26 DIAGNOSIS — K760 Fatty (change of) liver, not elsewhere classified: Secondary | ICD-10-CM | POA: Diagnosis not present

## 2018-08-26 DIAGNOSIS — Z79899 Other long term (current) drug therapy: Secondary | ICD-10-CM | POA: Diagnosis not present

## 2018-08-26 DIAGNOSIS — Z7982 Long term (current) use of aspirin: Secondary | ICD-10-CM | POA: Diagnosis not present

## 2018-08-26 DIAGNOSIS — D649 Anemia, unspecified: Secondary | ICD-10-CM | POA: Insufficient documentation

## 2018-08-26 DIAGNOSIS — R5383 Other fatigue: Secondary | ICD-10-CM | POA: Diagnosis not present

## 2018-08-26 DIAGNOSIS — Z515 Encounter for palliative care: Secondary | ICD-10-CM | POA: Diagnosis not present

## 2018-08-26 DIAGNOSIS — G629 Polyneuropathy, unspecified: Secondary | ICD-10-CM | POA: Insufficient documentation

## 2018-08-26 DIAGNOSIS — N183 Chronic kidney disease, stage 3 unspecified: Secondary | ICD-10-CM

## 2018-08-26 DIAGNOSIS — C9000 Multiple myeloma not having achieved remission: Secondary | ICD-10-CM

## 2018-08-26 DIAGNOSIS — Z5111 Encounter for antineoplastic chemotherapy: Secondary | ICD-10-CM | POA: Insufficient documentation

## 2018-08-26 DIAGNOSIS — D696 Thrombocytopenia, unspecified: Secondary | ICD-10-CM | POA: Insufficient documentation

## 2018-08-26 DIAGNOSIS — E785 Hyperlipidemia, unspecified: Secondary | ICD-10-CM | POA: Insufficient documentation

## 2018-08-26 DIAGNOSIS — Z800 Family history of malignant neoplasm of digestive organs: Secondary | ICD-10-CM | POA: Insufficient documentation

## 2018-08-26 DIAGNOSIS — D472 Monoclonal gammopathy: Secondary | ICD-10-CM

## 2018-08-26 DIAGNOSIS — Z7189 Other specified counseling: Secondary | ICD-10-CM | POA: Insufficient documentation

## 2018-08-26 MED ORDER — DEXAMETHASONE 4 MG PO TABS: 40.0000 mg | ORAL_TABLET | Freq: Once | ORAL | 0 refills | Status: AC

## 2018-08-26 MED ORDER — ACYCLOVIR 400 MG PO TABS: 400.0000 mg | ORAL_TABLET | Freq: Two times a day (BID) | ORAL | 3 refills | Status: DC

## 2018-08-26 NOTE — Telephone Encounter (Signed)
Oral Oncology Pharmacist Encounter  Received new prescription for Revlimid (lenalidomide) for the treatment of multiple myeloma in conjunction with Velcade and dexamethasone, planned duration until transplant ready or unacceptable toxicity.  CBC from 08/19/18 and BMP from 07/28/18 assessed, no relevant lab abnormalities at this time. Continue to monitor patient renal function. Prescription dose and frequency assessed.   Current medication list in Epic reviewed, no relevant DDIs with Revlimid identified.  Prescription has been e-scribed to the [facility] for benefits analysis and approval.  Patient education Counseled patient on administration, dosing, side effects, monitoring, drug-food interactions, safe handling, storage, and disposal. Patient will take 1 capsule (10 mg total) by mouth daily. Take for 14 days, then hold for 7 days. Repeat every 21 days.  Side effects include but not limited to: decreased plt/hgb/wbc, rash, fatigue, back pain, N/V/D, constipation.    Reviewed with patient importance of keeping a medication schedule and plan for any missed doses.  Mr. Caso voiced understanding and appreciation. All questions answered. Medication handout provided and consent obtained.  Oral Oncology Clinic will continue to follow for insurance authorization, copayment issues, and start date.  Provided patient with Oral Chemotherapy Navigation Clinic phone number. Patient knows to call the office with questions or concerns. Oral Chemotherapy Navigation Clinic will continue to follow.  Alyson N. Leonard, PharmD, BCPS, BCOP Hematology/Oncology Clinical Pharmacist [facility] Oral Chemotherapy Navigation Clinic [phone]  08/26/2018 11:15 AM  

## 2018-08-26 NOTE — Progress Notes (Signed)
Hematology/Oncology  Follow up note [facility] Telephone:([phone] Fax:([phone]) [phone]   Patient Care Team: Tonia Ghent, MD as PCP - General (Family Medicine)  REFERRING PROVIDER: Napoleon Form REASON FOR VISIT Follow up for bone marrow results/PET scan and discussion of diagnosis of multiple myeloma.   HISTORY OF PRESENTING ILLNESS:  Luis Burke is a  62 y.o.  male with PMH listed below who was referred to me for evaluation of anemia and thrombocytopenia Reviewed patient's recent labs, 07/06/2018 labs revealed anemia with hemoglobin of hemoglobin 11, platelet counts 100. Normal total white count and normal differential. Reviewed patient's previous labs, anemia is chronic onset , duration is since August 2019. In August 2019, he has a hemoglobin of 11.5 which decreased to 10.8 in September.  No aggravating or improving factors.  Associated signs and symptoms: Patient reports feeling fatigue.  Denies SOB with exertion.  Denies weight loss, easy bruising, hematochezia, hemoptysis, hematuria. Context: History of GI bleeding: Denies               History of Chronic kidney disease yes               History of autoimmune disease denies denies               History of hemolytic anemia.                Last colonoscopy: 07/20/2018 upper and lower endoscopy showed esophagitis, normal examined duodenum, 2 polyps removed in the ascending colon, resected and retrieved.  Otherwise normal.               He drinks alcohol, usually a few beers during the weekends.  Denies any smoking history.   Intended weight loss 3 pounds for the past 3 months.  INTERVAL HISTORY Luis Burke is a 62 y.o. male who has above history reviewed by me today for follow-up visit for discussion of bone marrow biopsy results and PET scan results, and discussion of newly diagnosed multiple myeloma and management plan. Patient has had lab work-up done 07/27/2018 multiple myeloma panel showed  M protein of 0.1, IgG 662, IgA 49, IgM 7. Patient was called back to further labs done. 08/10/2018, free light chain ratio showed extremely high level of kappa free light chain 10,183, with a kappa lambda light chain ratio of 1414.31 LDH 164 Beta-2 microglobulin 5 Patient was called and discuss about results.  He was recommended to undergo bone marrow biopsy and PET scan. 08/19/2018 bone marrow biopsy showed hypercellular marrow 80%, involved by plasma cell neoplasm up to 95%.  Consistent with plasma cell myeloma.  FISH and cytogenetics are pending.  09/10/2018 skeletal survey showed questionable small lucent lesions within the midshaft of the humerus bilaterally.  Otherwise no suspicious focal lytic lesion or acute bone abnormality. 08/25/2018 PET scan showed no definite hypermetabolic bone disease but CT findings are highly suspicious for numerous small myelomatous lesions involving the spine, sternum and scattered ribs.  Patient denies any bone pain.  He still does heavy weightlifting.  No new complaints today.  Reports feeling nervous about new diagnosis of multiple myeloma.  Review of Systems  Constitutional: Negative for chills, fever, malaise/fatigue and weight loss.  HENT: Negative for nosebleeds and sore throat.   Eyes: Negative for double vision, photophobia and redness.  Respiratory: Negative for cough, shortness of breath and wheezing.   Cardiovascular: Negative for chest pain, palpitations and orthopnea.  Gastrointestinal: Negative for abdominal pain, blood in stool, nausea and  vomiting.  Genitourinary: Negative for dysuria.  Musculoskeletal: Negative for back pain, myalgias and neck pain.  Skin: Negative for itching and rash.  Neurological: Negative for dizziness, tingling and tremors.  Endo/Heme/Allergies: Negative for environmental allergies. Does not bruise/bleed easily.  Psychiatric/Behavioral: Negative for depression. The patient is nervous/anxious.     MEDICAL HISTORY:    Past Medical History:  Diagnosis Date  . Anemia   . Fatty liver    on u/s 07/2018  . Hyperlipidemia 03/2004  . Hypertension 1994  . Snores     SURGICAL HISTORY: Past Surgical History:  Procedure Laterality Date  . CATARACT EXTRACTION W/PHACO Right 03/04/2018   Procedure: CATARACT EXTRACTION PHACO AND INTRAOCULAR LENS PLACEMENT ([place])  RIGHT TORIC;  Surgeon: Leandrew Koyanagi, MD;  Location: [facility];  Service: Ophthalmology;  Laterality: Right;  Per [place] no Toric Lens 1:45 5.3  . COLONOSCOPY WITH PROPOFOL N/A 07/20/2018   Procedure: COLONOSCOPY WITH PROPOFOL;  Surgeon: Jonathon Bellows, MD;  Location: [facility] ENDOSCOPY;  Service: Gastroenterology;  Laterality: N/A;  . ESOPHAGOGASTRODUODENOSCOPY (EGD) WITH PROPOFOL N/A 07/20/2018   Procedure: ESOPHAGOGASTRODUODENOSCOPY (EGD) WITH PROPOFOL;  Surgeon: Jonathon Bellows, MD;  Location: [facility] ENDOSCOPY;  Service: Gastroenterology;  Laterality: N/A;  . EYE SURGERY Right   . HERNIA REPAIR    . L Lap herniorraphy  04/2000  . Left wrist ganglionectomy      SOCIAL HISTORY: Social History   Socioeconomic History  . Marital status: Single    Spouse name: Not on file  . Number of children: 1  . Years of education: Not on file  . Highest education level: Not on file  Occupational History  . Occupation: [employer]  Social Needs  . Financial resource strain: Not on file  . Food insecurity:    Worry: Not on file    Inability: Not on file  . Transportation needs:    Medical: Not on file    Non-medical: Not on file  Tobacco Use  . Smoking status: Never Smoker  . Smokeless tobacco: Never Used  . Tobacco comment: Occassionally  Substance and Sexual Activity  . Alcohol use: Not Currently    Alcohol/week: 3.0 standard drinks    Types: 3 Cans of beer per week  . Drug use: No  . Sexual activity: Not on file  Lifestyle  . Physical activity:    Days per week: Not on file    Minutes per session: Not on file  . Stress: Not on file   Relationships  . Social connections:    Talks on phone: Not on file    Gets together: Not on file    Attends religious service: Not on file    Active member of club or organization: Not on file    Attends meetings of clubs or organizations: Not on file    Relationship status: Not on file  . Intimate partner violence:    Fear of current or ex partner: Not on file    Emotionally abused: Not on file    Physically abused: Not on file    Forced sexual activity: Not on file  Other Topics Concern  . Not on file  Social History Narrative   Divorced 12/14/2007, dating as of 12-13-2017   His daughter died 4 days after giving birth to the patient's granddaughter   Has joint custody of his dead daughter's child   Works at [employer] is [employer]    FAMILY HISTORY: Family History  Problem Relation Age of Onset  . Hypertension Mother   .  Diabetes Mother   . Heart disease Mother        CAD  . Dementia Mother   . Hypertension Father   . Heart disease Father        MI 02/03  . Colon cancer Father   . Hypertension Sister   . Hypertension Sister   . Hypertension Sister   . Prostate cancer Neg Hx     ALLERGIES:  is allergic to tadalafil.  MEDICATIONS:  Current Outpatient Medications  Medication Sig Dispense Refill  . acetaminophen (TYLENOL) 325 MG tablet Take 650 mg by mouth every 6 (six) hours as needed.    Marland Kitchen amLODipine (NORVASC) 5 MG tablet Take 1.5-2 tablets (7.5-10 mg total) by mouth daily. (Patient taking differently: Take 5 mg by mouth daily. )    . co-enzyme Q-10 50 MG capsule Take 50 mg by mouth daily.    . metoprolol succinate (TOPROL-XL) 50 MG 24 hr tablet Take with or immediately following a meal. (Patient taking differently: Take 50 mg by mouth daily. Take with or immediately following a meal.) 90 tablet 0  . oxyCODONE (ROXICODONE) 5 MG immediate release tablet Take 0.5-1 tablets (2.5-5 mg total) by mouth 3 (three) times daily as needed (sedation caution). 15 tablet 0  . vitamin  B-12 (CYANOCOBALAMIN) 1000 MCG tablet Take 1 tablet (1,000 mcg total) by mouth daily. 30 tablet 3  . acyclovir (ZOVIRAX) 400 MG tablet Take 1 tablet (400 mg total) by mouth 2 (two) times daily. 60 tablet 3  . dexamethasone (DECADRON) 4 MG tablet Take 10 tablets (40 mg total) by mouth once for 1 dose. 10 tablet 0   No current facility-administered medications for this visit.      PHYSICAL EXAMINATION: ECOG PERFORMANCE STATUS: 0 - Asymptomatic Vitals:   08/26/18 0850  BP: (!) 163/94  Pulse: 84  Resp: 16  Temp: (!) 97 F (36.1 C)   Filed Weights   08/26/18 0850  Weight: 203 lb 9 oz (92.3 kg)    Physical Exam  Constitutional: He is oriented to person, place, and time. No distress.  HENT:  Head: Normocephalic and atraumatic.  Mouth/Throat: Oropharynx is clear and moist.  Eyes: Pupils are equal, round, and reactive to light. EOM are normal. No scleral icterus.  Neck: Normal range of motion. Neck supple.  Cardiovascular: Normal rate, regular rhythm and normal heart sounds.  Pulmonary/Chest: Effort normal. No respiratory distress. He has no wheezes.  Abdominal: Soft. Bowel sounds are normal. He exhibits no distension and no mass. There is no tenderness.  Musculoskeletal: Normal range of motion. He exhibits no edema or deformity.  Neurological: He is alert and oriented to person, place, and time. No cranial nerve deficit. Coordination normal.  Skin: Skin is warm and dry. No rash noted. No erythema.  Psychiatric: He has a normal mood and affect. His behavior is normal. Thought content normal.     LABORATORY DATA:  I have reviewed the data as listed Lab Results  Component Value Date   WBC 4.5 08/19/2018   HGB 9.6 (L) 08/19/2018   HCT 28.1 (L) 08/19/2018   MCV 95.3 08/19/2018   PLT 87 (L) 08/19/2018   Recent Labs    06/09/18 1104 06/16/18 0828 07/06/18 0851 07/27/18 1034 07/28/18 0845  NA 141 142 141 143 143  K 4.2 4.2 4.7 4.3 3.9  CL 105 105 106 107 109  CO2 _0 GLUCOSE 108* 116* 120* 105* 112*  BUN 27* 28* 25* 26* 23  CREATININE 1.59* 1.59* 1.53* 1.51* 1.53*  CALCIUM 10.3 10.0 10.2 10.4* 10.1  GFRNONAA  --   --   --  48* 47*  GFRAA  --   --   --  55* 55*  PROT 7.4 7.1  --  7.7  --   ALBUMIN 4.8 4.6  --  4.9  --   AST 22 19  --  22  --   ALT 28 23  --  22  --   ALKPHOS 51 55  --  56  --   BILITOT 0.6 0.4  --  0.7  --    Iron/TIBC/Ferritin/ %Sat    Component Value Date/Time   IRON 74 07/01/2018 0944   TIBC 250 07/01/2018 0944   FERRITIN 357 07/01/2018 0944   IRONPCTSAT 30 07/01/2018 0944    Lab Results  Component Value Date   TOTALPROTELP 6.9 07/27/2018   Lab Results  Component Value Date   KPAFRELGTCHN 10,183.0 (H) 08/10/2018   LAMBDASER 7.2 08/10/2018   KAPLAMBRATIO 1,414.31 (H) 08/10/2018       ASSESSMENT & PLAN:  1. Multiple myeloma not having achieved remission ([place])   2. Goals of care, counseling/discussion   3. CKD (chronic kidney disease) stage 3, GFR 30-59 ml/min (HCC)   4. 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 Thrombocytopenia ([place])   6. Anemia, unspecified type     I independently reviewed patient's PET scan, skeletal survey, bone marrow biopsy and discussed with patient.  The light chain multiple myeloma diagnosis and care plan were discussed with patient in detail.  NCCN guidelines were reviewed and shared with patient.  Beta 2 microglobulin 5 and normal albumin.  Stage II. Risk stratified group pending on his cytogenetics and FISH panel  #Discussed with patient that multiple myeloma is not a curable disease.  Multiple myeloma is treatable The goal of treatment which is to palliate disease, disease related symptoms, improve quality of life and hopefully prolong life was highlighted in our discussion.  We discussed about using combination of Revlimid, Velcade, and dexamethasone as his first-line treatment.  Chemotherapy education was provided.  We had discussed the composition of chemotherapy regimen, length of  chemo cycle, duration of treatment and the time to assess response to treatment.  Supportive care measures are necessary for patient well-being and will be provided as necessary.  I explained to the patient the risks and benefits of chemotherapy including all but not limited to bone marrow suppression, bleeding, blood clot, reactivation of virus infection, risk of life threatening infection and even death, secondary malignancy etc.  Risk of neuropathy is associated with Velcade. Patient voices understanding and willing to proceed chemotherapy.  He needs to be started on aspirin 81 mg daily and Acyclovir over 400 mg twice daily for shingle prophylaxis. I will send to her pharmacy a dose of 40 mg dexamethasone which he can start today. Plan start Velcade treatment this week.  Patient is potentially a transplant candidate.  Will refer to bone marrow transplant center in the near future for evaluation.  We also briefly discussed about autologous bone marrow transplant today. Patient will need to go to chemo class for Velcade. We will also need pharmacist Allyson to discuss about potential side effects for Velcade.  We discussed again with patient about avoiding NSAIDs, IV contrast if possible, and other nephrotoxins. Encourage patient to obtain dental evaluation for clearance of bisphosphonate or Xgeva treatment.  Discussed about rationale of utilizing these agents to reduce skeletal events. Advised patient to  avoid heavy lifting or contact sports at this point.  We spent sufficient time to discuss many aspect of care, questions were answered to patient's satisfaction.   Orders Placed This Encounter  Procedures  . CBC with Differential    Standing Status:   Standing    Number of Occurrences:   20    Standing Expiration Date:   08/27/2019  . Comprehensive metabolic panel    Standing Status:   Standing    Number of Occurrences:   20    Standing Expiration Date:   08/27/2019    All questions  were answered. The patient knows to call the clinic with any problems questions or concerns.  Return of visit: Day 1 of Velcade treatment.   Total face to face encounter time for this patient visit was 40 min. >50% of the time was  spent in counseling and coordination of care.   Earlie Server, MD, PhD Hematology Oncology [facility] at [facility] Pager- [phone] 08/26/2018

## 2018-08-26 NOTE — Progress Notes (Signed)
START ON PATHWAY REGIMEN - Multiple Myeloma and Other Plasma Cell Dyscrasias     A cycle is every 21 days:     Bortezomib      Lenalidomide      Dexamethasone   **Always confirm dose/schedule in your pharmacy ordering system**  Patient Characteristics: Newly Diagnosed, Transplant Eligible, Unknown or Awaiting Test Results R-ISS Staging: II Disease Classification: Newly Diagnosed Is Patient Eligible for Transplant<= Transplant Eligible Risk Status: Awaiting Test Results Intent of Therapy: Non-Curative / Palliative Intent, Discussed with Patient

## 2018-08-26 NOTE — Telephone Encounter (Signed)
Oral Oncology Patient Advocate Encounter  Received notification from [facility] that prior authorization for Revlimid is required.  PA submitted on CoverMyMeds Key [id number] Status is pending  Oral Oncology Clinic will continue to follow.

## 2018-08-26 NOTE — Telephone Encounter (Signed)
Oral Oncology Patient Advocate Encounter  Prior Authorization for Revlimid has been approved.    PA# [id number] Effective dates: 08/26/18 through 08/25/19  Oral Oncology Clinic will continue to follow.   [place] Patient [place] Phone [phone] Fax [phone] 08/26/2018 4:30 PM

## 2018-08-26 NOTE — Telephone Encounter (Signed)
Patient enrolled in RevlimidREMS through [employer]. Auth/REMs# [id number].

## 2018-08-27 ENCOUNTER — Inpatient Hospital Stay: Payer: BLUE CROSS/BLUE SHIELD | Attending: Oncology

## 2018-08-27 ENCOUNTER — Encounter (HOSPITAL_COMMUNITY): Payer: Self-pay | Admitting: Oncology

## 2018-08-27 ENCOUNTER — Inpatient Hospital Stay: Payer: BLUE CROSS/BLUE SHIELD

## 2018-08-27 LAB — UPEP/UIFE/LIGHT CHAINS/TP, 24-HR UR
% BETA, URINE: 7.8 %
ALPHA 1 URINE: 1.4 %
ALPHA 2 UR: 2.8 %
Albumin, U: 44.5 %
FREE KAPPA LT CHAINS, UR: 3350 mg/L — AB (ref 1.35–24.19)
FREE LAMBDA LT CHAINS, UR: 2.56 mg/L (ref 0.24–6.66)
Free Kappa/Lambda Ratio: 1308.59 — ABNORMAL HIGH (ref 2.04–10.37)
GAMMA GLOBULIN URINE: 43.4 %
M-SPIKE %, URINE: 37.8 % — AB
M-SPIKE, MG/24 HR: 301 mg/(24.h) — AB
Total Protein, Urine-Ur/day: 796 mg/24 hr — ABNORMAL HIGH (ref 30–150)
Total Protein, Urine: 54.9 mg/dL
Total Volume: 1450

## 2018-08-27 MED ORDER — ACYCLOVIR 400 MG PO TABS: 400.0000 mg | ORAL_TABLET | Freq: Two times a day (BID) | ORAL | 3 refills | Status: DC

## 2018-08-27 MED ORDER — LENALIDOMIDE 10 MG PO CAPS: 10.0000 mg | ORAL_CAPSULE | Freq: Every day | ORAL | 0 refills | Status: DC

## 2018-08-27 NOTE — Telephone Encounter (Signed)
Oral Chemotherapy Pharmacist Encounter   Received a call from Mr. Walling stating that the prescription sent yesterday for supportive care medication were sent to the wrong pharmacy (a pharmacy in [place], [place]). Removed incorrect pharmacy from med list and medications sent to the correct local pharmacy.   Darl Pikes, PharmD, BCPS, [facility] Hematology/Oncology Clinical Pharmacist [facility] Oral [facility] [phone]  08/27/2018 9:26 AM

## 2018-08-27 NOTE — Patient Instructions (Signed)

## 2018-08-27 NOTE — Telephone Encounter (Signed)
Oral Chemotherapy Pharmacist Encounter  Due to insurance restriction the medication could not be filled at [facility]. Prescription has been e-scribed to [facility].  Supportive information was faxed to [facility]. We will continue to follow medication access.   Darl Pikes, PharmD, BCPS, [facility] Hematology/Oncology Clinical Pharmacist [facility] [phone]  08/27/2018 11:55 AM

## 2018-08-28 ENCOUNTER — Inpatient Hospital Stay: Payer: BLUE CROSS/BLUE SHIELD

## 2018-08-28 ENCOUNTER — Encounter: Payer: Self-pay | Admitting: Oncology

## 2018-08-28 ENCOUNTER — Inpatient Hospital Stay (HOSPITAL_BASED_OUTPATIENT_CLINIC_OR_DEPARTMENT_OTHER): Payer: BLUE CROSS/BLUE SHIELD | Admitting: Oncology

## 2018-08-28 ENCOUNTER — Telehealth: Payer: Self-pay | Admitting: Family Medicine

## 2018-08-28 ENCOUNTER — Other Ambulatory Visit: Payer: Self-pay

## 2018-08-28 ENCOUNTER — Inpatient Hospital Stay (HOSPITAL_BASED_OUTPATIENT_CLINIC_OR_DEPARTMENT_OTHER): Payer: BLUE CROSS/BLUE SHIELD | Admitting: Hospice and Palliative Medicine

## 2018-08-28 VITALS — BP 180/100

## 2018-08-28 VITALS — BP 173/101 | HR 78 | Temp 97.2°F | Resp 17 | Wt 203.9 lb

## 2018-08-28 DIAGNOSIS — K760 Fatty (change of) liver, not elsewhere classified: Secondary | ICD-10-CM | POA: Diagnosis not present

## 2018-08-28 DIAGNOSIS — D649 Anemia, unspecified: Secondary | ICD-10-CM

## 2018-08-28 DIAGNOSIS — D696 Thrombocytopenia, unspecified: Secondary | ICD-10-CM

## 2018-08-28 DIAGNOSIS — N183 Chronic kidney disease, stage 3 unspecified: Secondary | ICD-10-CM

## 2018-08-28 DIAGNOSIS — I129 Hypertensive chronic kidney disease with stage 1 through stage 4 chronic kidney disease, or unspecified chronic kidney disease: Secondary | ICD-10-CM | POA: Diagnosis not present

## 2018-08-28 DIAGNOSIS — R5383 Other fatigue: Secondary | ICD-10-CM

## 2018-08-28 DIAGNOSIS — Z515 Encounter for palliative care: Secondary | ICD-10-CM

## 2018-08-28 DIAGNOSIS — Z5111 Encounter for antineoplastic chemotherapy: Secondary | ICD-10-CM | POA: Diagnosis not present

## 2018-08-28 DIAGNOSIS — Z79899 Other long term (current) drug therapy: Secondary | ICD-10-CM | POA: Diagnosis not present

## 2018-08-28 DIAGNOSIS — E785 Hyperlipidemia, unspecified: Secondary | ICD-10-CM

## 2018-08-28 DIAGNOSIS — G629 Polyneuropathy, unspecified: Secondary | ICD-10-CM | POA: Diagnosis not present

## 2018-08-28 DIAGNOSIS — C9000 Multiple myeloma not having achieved remission: Secondary | ICD-10-CM

## 2018-08-28 DIAGNOSIS — Z800 Family history of malignant neoplasm of digestive organs: Secondary | ICD-10-CM | POA: Diagnosis not present

## 2018-08-28 DIAGNOSIS — Z7982 Long term (current) use of aspirin: Secondary | ICD-10-CM | POA: Diagnosis not present

## 2018-08-28 DIAGNOSIS — I10 Essential (primary) hypertension: Secondary | ICD-10-CM

## 2018-08-28 LAB — COMPREHENSIVE METABOLIC PANEL
ALK PHOS: 57 U/L (ref 38–126)
ALT: 30 U/L (ref 0–44)
AST: 24 U/L (ref 15–41)
Albumin: 4.6 g/dL (ref 3.5–5.0)
Anion gap: 8 (ref 5–15)
BILIRUBIN TOTAL: 0.6 mg/dL (ref 0.3–1.2)
BUN: 26 mg/dL — ABNORMAL HIGH (ref 8–23)
CALCIUM: 9.9 mg/dL (ref 8.9–10.3)
CO2: 25 mmol/L (ref 22–32)
CREATININE: 1.75 mg/dL — AB (ref 0.61–1.24)
Chloride: 107 mmol/L (ref 98–111)
GFR calc Af Amer: 46 mL/min — ABNORMAL LOW (ref >60)
GFR, EST NON AFRICAN AMERICAN: 40 mL/min — AB (ref >60)
Glucose, Bld: 91 mg/dL (ref 70–99)
POTASSIUM: 4.2 mmol/L (ref 3.5–5.1)
Sodium: 140 mmol/L (ref 135–145)
TOTAL PROTEIN: 7.2 g/dL (ref 6.5–8.1)

## 2018-08-28 LAB — CBC WITH DIFFERENTIAL/PLATELET
Abs Immature Granulocytes: 0.16 10*3/uL — ABNORMAL HIGH (ref 0.00–0.07)
BASOS PCT: 0 %
Basophils Absolute: 0 10*3/uL (ref 0.0–0.1)
EOS ABS: 0.1 10*3/uL (ref 0.0–0.5)
EOS PCT: 2 %
HCT: 27.4 % — ABNORMAL LOW (ref 39.0–52.0)
Hemoglobin: 9.3 g/dL — ABNORMAL LOW (ref 13.0–17.0)
Immature Granulocytes: 3 %
Lymphocytes Relative: 37 %
Lymphs Abs: 2 10*3/uL (ref 0.7–4.0)
MCH: 31.7 pg (ref 26.0–34.0)
MCHC: 33.9 g/dL (ref 30.0–36.0)
MCV: 93.5 fL (ref 80.0–100.0)
MONO ABS: 0.5 10*3/uL (ref 0.1–1.0)
MONOS PCT: 10 %
NEUTROS ABS: 2.6 10*3/uL (ref 1.7–7.7)
Neutrophils Relative %: 48 %
PLATELETS: 87 10*3/uL — AB (ref 150–400)
RBC: 2.93 MIL/uL — AB (ref 4.22–5.81)
RDW: 14.5 % (ref 11.5–15.5)
WBC: 5.4 10*3/uL (ref 4.0–10.5)
nRBC: 0 % (ref 0.0–0.2)

## 2018-08-28 MED ORDER — PROCHLORPERAZINE MALEATE 10 MG PO TABS
10.0000 mg | ORAL_TABLET | Freq: Once | ORAL | Status: AC
  Administered 2018-08-28: 10 mg via ORAL
  Filled 2018-08-28: qty 1

## 2018-08-28 MED ORDER — AMLODIPINE BESYLATE 5 MG PO TABS: 10.0000 mg | ORAL_TABLET | Freq: Every day | ORAL | Status: DC

## 2018-08-28 MED ORDER — METOPROLOL SUCCINATE ER 50 MG PO TB24: 100.0000 mg | ORAL_TABLET | Freq: Every day | ORAL | Status: DC

## 2018-08-28 MED ORDER — DEXAMETHASONE 4 MG PO TABS: 40.0000 mg | ORAL_TABLET | ORAL | 0 refills | Status: DC

## 2018-08-28 MED ORDER — BORTEZOMIB CHEMO SQ INJECTION 3.5 MG (2.5MG/ML)
1.3000 mg/m2 | Freq: Once | INTRAMUSCULAR | Status: AC
  Administered 2018-08-28: 2.75 mg via SUBCUTANEOUS
  Filled 2018-08-28: qty 1.1

## 2018-08-28 NOTE — Progress Notes (Signed)
Cr 1.75, MD ok to proceed with treatment today

## 2018-08-28 NOTE — Progress Notes (Signed)
Hematology/Oncology  Follow up note [facility] [phone] [phone]   Patient Care Team: Tonia Ghent, MD as PCP - General (Family Medicine)  REFERRING PROVIDER: Napoleon Form REASON FOR VISIT Follow up for bone marrow results/PET scan and discussion of diagnosis of multiple myeloma.   HISTORY OF PRESENTING ILLNESS:  Luis Burke is a  62 y.o.  male with PMH listed below who was referred to me for evaluation of anemia and thrombocytopenia Associated signs and symptoms: Patient reports feeling fatigue.  Denies SOB with exertion.  Denies weight loss, easy bruising, hematochezia, hemoptysis, hematuria. Context: History of GI bleeding: Denies               History of Chronic kidney disease yes               History of autoimmune disease denies denies               History of hemolytic anemia.                Last colonoscopy: 07/20/2018 upper and lower endoscopy showed esophagitis, normal examined duodenum, 2 polyps removed in the ascending colon, resected and retrieved.  Otherwise normal. He drinks alcohol, usually a few beers during the weekends.  Denies any smoking history.   Intended weight loss 3 pounds for the past 3 months.  # 07/27/2018 multiple myeloma panel showed M protein of 0.1, IgG 662, IgA 49, IgM 7. Patient was called back to further labs done. 08/10/2018, free light chain ratio showed extremely high level of kappa free light chain 10,183, with a kappa lambda light chain ratio of 1414.31 LDH 164 Beta-2 microglobulin 5 Patient was called and discuss about results.  He was recommended to undergo bone marrow biopsy and PET scan. 08/19/2018 bone marrow biopsy showed hypercellular marrow 80%, involved by plasma cell neoplasm up to 95%.  Consistent with plasma cell myeloma.  FISH and cytogenetics are pending.  09/10/2018 skeletal survey showed questionable small lucent lesions within the midshaft of the humerus bilaterally.   Otherwise no suspicious focal lytic lesion or acute bone abnormality. 08/25/2018 PET scan showed no definite hypermetabolic bone disease but CT findings are highly suspicious for numerous small myelomatous lesions involving the spine, sternum and scattered ribs.   INTERVAL HISTORY Luis Burke is a 62 y.o. male who has above history reviewed by me today for follow-up visit for assessment prior to Velcade treatment for newly diagnosed multiple myeloma and continue discussion of management plan.  Discussed with patient at the last visit about his new diagnosis of multiple myeloma and we reviewed the management plan. He has attended chemo class for Velcade. He has also met with pharmacist Allyson and has started paperwork for obtaining Revlimid. Reports feeling anxious about the new diagnosis. Blood pressure running high.  Currently he takes Toprol XL 50 mg daily, and amlodipine 5 mg daily.  He does have a prescription in the EMR instructing patient to take 7.5 mg to 10 mg total daily patient is taking differently. Denies any bone pain.  He has been advised to stop heavy lifting.  Patient is accompanied by friend today.  Review of Systems  Constitutional: Negative for chills, fever, malaise/fatigue and weight loss.  HENT: Negative for nosebleeds and sore throat.   Eyes: Negative for double vision, photophobia and redness.  Respiratory: Negative for cough, shortness of breath and wheezing.   Cardiovascular: Negative for chest pain, palpitations and orthopnea.  Gastrointestinal: Negative for abdominal pain, blood  in stool, nausea and vomiting.  Genitourinary: Negative for dysuria.  Musculoskeletal: Negative for back pain, myalgias and neck pain.  Skin: Negative for itching and rash.  Neurological: Negative for dizziness, tingling and tremors.  Endo/Heme/Allergies: Negative for environmental allergies. Does not bruise/bleed easily.  Psychiatric/Behavioral: Negative for depression. The  patient is nervous/anxious.     MEDICAL HISTORY:  Past Medical History:  Diagnosis Date  . Anemia   . Fatty liver    on u/s 07/2018  . Hyperlipidemia 03/2004  . Hypertension 1994  . Snores     SURGICAL HISTORY: Past Surgical History:  Procedure Laterality Date  . CATARACT EXTRACTION W/PHACO Right 03/04/2018   Procedure: CATARACT EXTRACTION PHACO AND INTRAOCULAR LENS PLACEMENT ([place])  RIGHT TORIC;  Surgeon: Leandrew Koyanagi, MD;  Location: [place];  Service: Ophthalmology;  Laterality: Right;  Per [place] no Toric Lens 1:45 5.3  . COLONOSCOPY WITH PROPOFOL N/A 07/20/2018   Procedure: COLONOSCOPY WITH PROPOFOL;  Surgeon: Jonathon Bellows, MD;  Location: [facility] ENDOSCOPY;  Service: Gastroenterology;  Laterality: N/A;  . ESOPHAGOGASTRODUODENOSCOPY (EGD) WITH PROPOFOL N/A 07/20/2018   Procedure: ESOPHAGOGASTRODUODENOSCOPY (EGD) WITH PROPOFOL;  Surgeon: Jonathon Bellows, MD;  Location: [facility] ENDOSCOPY;  Service: Gastroenterology;  Laterality: N/A;  . EYE SURGERY Right   . HERNIA REPAIR    . L Lap herniorraphy  04/2000  . Left wrist ganglionectomy      SOCIAL HISTORY: Social History   Socioeconomic History  . Marital status: Single    Spouse name: Not on file  . Number of children: 1  . Years of education: Not on file  . Highest education level: Not on file  Occupational History  . Occupation: [employer]  Social Needs  . Financial resource strain: Not on file  . Food insecurity:    Worry: Not on file    Inability: Not on file  . Transportation needs:    Medical: Not on file    Non-medical: Not on file  Tobacco Use  . Smoking status: Never Smoker  . Smokeless tobacco: Never Used  . Tobacco comment: Occassionally  Substance and Sexual Activity  . Alcohol use: Not Currently    Alcohol/week: 3.0 standard drinks    Types: 3 Cans of beer per week  . Drug use: No  . Sexual activity: Not on file  Lifestyle  . Physical activity:    Days per week: Not on file    Minutes per  session: Not on file  . Stress: Not on file  Relationships  . Social connections:    Talks on phone: Not on file    Gets together: Not on file    Attends religious service: Not on file    Active member of club or organization: Not on file    Attends meetings of clubs or organizations: Not on file    Relationship status: Not on file  . Intimate partner violence:    Fear of current or ex partner: Not on file    Emotionally abused: Not on file    Physically abused: Not on file    Forced sexual activity: Not on file  Other Topics Concern  . Not on file  Social History Narrative   Divorced 2007/12/03, dating as of 12-02-17   His daughter died 4 days after giving birth to the patient's granddaughter   Has joint custody of his dead daughter's child   Works at [employer] is [employer]    FAMILY HISTORY: Family History  Problem Relation Age of Onset  .  Hypertension Mother   . Diabetes Mother   . Heart disease Mother        CAD  . Dementia Mother   . Hypertension Father   . Heart disease Father        MI 02/03  . Colon cancer Father   . Hypertension Sister   . Hypertension Sister   . Hypertension Sister   . Prostate cancer Neg Hx     ALLERGIES:  is allergic to tadalafil.  MEDICATIONS:  Current Outpatient Medications  Medication Sig Dispense Refill  . acetaminophen (TYLENOL) 325 MG tablet Take 650 mg by mouth every 6 (six) hours as needed.    Marland Kitchen amLODipine (NORVASC) 5 MG tablet Take 1.5-2 tablets (7.5-10 mg total) by mouth daily. (Patient taking differently: Take 5 mg by mouth daily. )    . co-enzyme Q-10 50 MG capsule Take 50 mg by mouth daily.    . metoprolol succinate (TOPROL-XL) 50 MG 24 hr tablet Take with or immediately following a meal. (Patient taking differently: Take 50 mg by mouth daily. Take with or immediately following a meal.) 90 tablet 0  . oxyCODONE (ROXICODONE) 5 MG immediate release tablet Take 0.5-1 tablets (2.5-5 mg total) by mouth 3 (three) times daily as needed  (sedation caution). 15 tablet 0  . vitamin B-12 (CYANOCOBALAMIN) 1000 MCG tablet Take 1 tablet (1,000 mcg total) by mouth daily. 30 tablet 3  . acyclovir (ZOVIRAX) 400 MG tablet Take 1 tablet (400 mg total) by mouth 2 (two) times daily. (Patient not taking: Reported on 08/28/2018) 60 tablet 3  . lenalidomide (REVLIMID) 10 MG capsule Take 1 capsule (10 mg total) by mouth daily. Take for 14 days, then hold for 7 days. Repeat every 21 days. (Patient not taking: Reported on 08/28/2018) 14 capsule 0   No current facility-administered medications for this visit.      PHYSICAL EXAMINATION: ECOG PERFORMANCE STATUS: 0 - Asymptomatic Vitals:   08/28/18 0920  BP: (!) 173/101  Pulse: 78  Resp: 17  Temp: (!) 97.2 F (36.2 C)   Filed Weights   08/28/18 0920  Weight: 203 lb 14.4 oz (92.5 kg)    Physical Exam  Constitutional: He is oriented to person, place, and time. No distress.  HENT:  Head: Normocephalic and atraumatic.  Mouth/Throat: Oropharynx is clear and moist.  Eyes: Pupils are equal, round, and reactive to light. EOM are normal. No scleral icterus.  Neck: Normal range of motion. Neck supple.  Cardiovascular: Normal rate, regular rhythm and normal heart sounds.  Pulmonary/Chest: Effort normal. No respiratory distress. He has no wheezes.  Abdominal: Soft. Bowel sounds are normal. He exhibits no distension and no mass. There is no tenderness.  Musculoskeletal: Normal range of motion. He exhibits no edema or deformity.  Neurological: He is alert and oriented to person, place, and time. No cranial nerve deficit. Coordination normal.  Skin: Skin is warm and dry. No rash noted. No erythema.  Psychiatric: He has a normal mood and affect. His behavior is normal. Thought content normal.     LABORATORY DATA:  I have reviewed the data as listed Lab Results  Component Value Date   WBC 5.4 08/28/2018   HGB 9.3 (L) 08/28/2018   HCT 27.4 (L) 08/28/2018   MCV 93.5 08/28/2018   PLT 87 (L)  08/28/2018   Recent Labs    06/16/18 0828  07/27/18 1034 07/28/18 0845 08/28/18 0903  NA 142   < > 143 143 140  K 4.2   < >  4.3 3.9 4.2  CL 105   < > 107 109 107  CO2 28   < > _0 GLUCOSE 116*   < > 105* 112* 91  BUN 28*   < > 26* 23 26*  CREATININE 1.59*   < > 1.51* 1.53* 1.75*  CALCIUM 10.0   < > 10.4* 10.1 9.9  GFRNONAA  --   --  48* 47* 40*  GFRAA  --   --  55* 55* 46*  PROT 7.1  --  7.7  --  7.2  ALBUMIN 4.6  --  4.9  --  4.6  AST 19  --  22  --  24  ALT 23  --  22  --  30  ALKPHOS 55  --  56  --  57  BILITOT 0.4  --  0.7  --  0.6   < > = values in this interval not displayed.   Iron/TIBC/Ferritin/ %Sat    Component Value Date/Time   IRON 74 07/01/2018 0944   TIBC 250 07/01/2018 0944   FERRITIN 357 07/01/2018 0944   IRONPCTSAT 30 07/01/2018 0944    Lab Results  Component Value Date   TOTALPROTELP 6.9 07/27/2018   Lab Results  Component Value Date   KPAFRELGTCHN 10,183.0 (H) 08/10/2018   LAMBDASER 7.2 08/10/2018   KAPLAMBRATIO 1,308.59 (H) 08/23/2018       ASSESSMENT & PLAN:  1. Multiple myeloma not having achieved remission ([place])   2. Anemia, unspecified type   3. Thrombocytopenia ([place])   4. 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 CKD (chronic kidney disease) stage 3, GFR 30-59 ml/min (HCC)   6. Essential hypertension   Beta 2 microglobulin 5 and normal albumin.  Stage II. cytogenetics showed normal male chromosome, MDS FISH panel showed trisomy 98- Standard Risk.  #Reviewed patient's diagnosis of multiple myeloma today again with patient and his friend.  Independently reviewed patient's PET scan, skeletal survey and a bone marrow biopsy and discussed with them. We discussed about starting combination of Revlimid, Velcade and dexamethasone (VRd) as his first-line treatment. Patient brought his medication and I went over instruction of each medication with him. He will take dexamethasone 40 mg today.  Labs reviewed counts acceptable to proceed with cycle  1 day 1 Velcade treatment. When he receives Revlimid, will add Revlimid to his regimen.   Start aspirin 81 mg daily and Acyclovir over 400 mg daily for shingle prophylaxis, will increase to Acyclovir 426m BID in the near future.  # CKD, kidney function showed creatinine 1.75, worsened.  We discussed again with patient about avoiding NSAIDs, IV contrast if possible, and other nephrotoxins.  Encourage oral hydration. #Hypertension, his blood pressure control need to be further optimized.  He is currently taking Norvasc 5 mg, metoprolol 50 mg daily.   I have discussed with patient's primary care physician Dr. DDamita Dunningsvia secure chat and the office will contact patient to further adjust his blood pressure regimen.  # awaiting dental evaluation for clearance of bisphosphonate or Xgeva treatment.  Discussed about rationale of utilizing these agents to reduce skeletal events. Advised patient to avoid heavy lifting or contact sports at this point.  We spent sufficient time to discuss many aspect of care, questions were answered to patient's satisfaction.  Follow up in 1 week.   All questions were answered. The patient knows to call the clinic with any problems questions or concerns.  Return of visit: 1 week Total face to face encounter  time for this patient visit was 25 min. >50% of the time was  spent in counseling and coordination of care.   Earlie Server, MD, PhD Hematology Oncology [facility] Pager- [phone] 08/28/2018

## 2018-08-28 NOTE — Telephone Encounter (Signed)
Patient notified as instructed by telephone and verbalized understanding. Patient stated that he was told to double up on his Amlodipine and is taking two a day and Metoprolol 50 mg one a day. Patient stated that he has been monitoring his blood pressure at home and at night it has been running 170/102 and the other night he felt that his heart was pounding. Patient stated that he has not had this happen in the past, but has a lot going on at this time. Patient stated that he feels that he needs to schedule a follow-up appointment with Dr. Damita Dunnings to discuss his concerns. Offered patient an appointment Monday which he declined stating that he works in [place] and is having to take off a lot to go to the cancer center. Patient requested an appointment on a Friday. Appointment scheduled for next Friday 09/04/18 at 10:30 per patient's request. Patient stated that he has an appointment that morning at the cancer center and would like to finish that appointment and then see Dr. Damita Dunnings before going to work. Advised patient to continue to monitor his BP and keep Dr. Damita Dunnings updated.. Advised patient that his blood pressure  gets worse or he has any heart issues before his appointment to go to the ER and he agreed.

## 2018-08-28 NOTE — Progress Notes (Signed)
[place]  Telephone:([phone] [phone]  Patient Care Team: Tonia Ghent, MD as PCP - General (Family Medicine)   Name of the patient: Luis Burke  [id number]  1956/01/17   Date of Visit: 08/28/18  Diagnosis: Multiple Myeloma  Current Treatment: Bortezomib, Lenalidomide, Dexamethasone   Reason for Visit: This patient is a 62 y.o. male who presents to chemo care clinic today for initial meeting in preparation for starting chemotherapy. I introduced the chemo care clinic and we discussed that the role of the clinic is to assist those who are at an increased risk of emergency room visits and/or complications during the course of chemotherapy treatment. We discussed that the increased risk takes into account factors such as age, performance status, and co-morbidities. We also discussed that for some, this might include barriers to care such as not having a primary care provider, lack of insurance/transportation, or not being able to afford medications. We discussed that the goal of the program is to help prevent unplanned ER visits and help reduce complications during chemotherapy. We do this by discussing specific risk factors to each individual and identifying ways that we can help improve these risk factors and reduce barriers to care.   Hematology/Oncology History:    Multiple myeloma ([place])   08/26/2018 Initial Diagnosis    Multiple myeloma ([place])    08/26/2018 -  Chemotherapy    The patient had bortezomib SQ (VELCADE) chemo injection 2.75 mg, 1.3 mg/m2 = 2.75 mg, Subcutaneous,  Once, 0 of 4 cycles  for chemotherapy treatment.       Allergies  Allergen Reactions  . Tadalafil     Intolerant,  Didn't feel well on med     Past Medical History:  Diagnosis Date  . Anemia   . Fatty liver    on u/s 07/2018  . Hyperlipidemia 03/2004  . Hypertension 1994  . Snores      Past Surgical History:    Procedure Laterality Date  . CATARACT EXTRACTION W/PHACO Right 03/04/2018   Procedure: CATARACT EXTRACTION PHACO AND INTRAOCULAR LENS PLACEMENT ([place])  RIGHT TORIC;  Surgeon: Leandrew Koyanagi, MD;  Location: [place];  Service: Ophthalmology;  Laterality: Right;  [place] no Toric Lens 1:45 5.3  . COLONOSCOPY WITH PROPOFOL N/A 07/20/2018   Procedure: COLONOSCOPY WITH PROPOFOL;  Surgeon: Jonathon Bellows, MD;  [facility] ENDOSCOPY;  Service: Gastroenterology;  Laterality: N/A;  . ESOPHAGOGASTRODUODENOSCOPY (EGD) WITH PROPOFOL N/A 07/20/2018   Procedure: ESOPHAGOGASTRODUODENOSCOPY (EGD) WITH PROPOFOL;  Surgeon: Jonathon Bellows, MD;  Location: [facility] ENDOSCOPY;  Service: Gastroenterology;  Laterality: N/A;  . EYE SURGERY Right   . HERNIA REPAIR    . L Lap herniorraphy  04/2000  . Left wrist ganglionectomy      Social History   Socioeconomic History  . Marital status: Single    Spouse name: Not on file  . Number of children: 1  . Years of education: Not on file  . Highest education level: Not on file  Occupational History  . Occupation: [employer]  Social Needs  . Financial resource strain: Not on file  . Food insecurity:    Worry: Not on file    Inability: Not on file  . Transportation needs:    Medical: Not on file    Non-medical: Not on file  Tobacco Use  . Smoking status: Never Smoker  . Smokeless tobacco: Never Used  . Tobacco comment: Occassionally  Substance and Sexual Activity  .  Alcohol use: Not Currently    Alcohol/week: 3.0 standard drinks    Types: 3 Cans of beer per week  . Drug use: No  . Sexual activity: Not on file  Lifestyle  . Physical activity:    Days per week: Not on file    Minutes per session: Not on file  . Stress: Not on file  Relationships  . Social connections:    Talks on phone: Not on file    Gets together: Not on file    Attends religious service: Not on file    Active member of club or organization: Not on file    Attends meetings  of clubs or organizations: Not on file    Relationship status: Not on file  . Intimate partner violence:    Fear of current or ex partner: Not on file    Emotionally abused: Not on file    Physically abused: Not on file    Forced sexual activity: Not on file  Other Topics Concern  . Not on file  Social History Narrative   Divorced 11/20/07, dating as of 2017/11/19   His daughter died 4 days after giving birth to the patient's granddaughter   Has joint custody of his dead daughter's child   Works at [employer] is [employer]    Family History  Problem Relation Age of Onset  . Hypertension Mother   . Diabetes Mother   . Heart disease Mother        CAD  . Dementia Mother   . Hypertension Father   . Heart disease Father        MI 02/03  . Colon cancer Father   . Hypertension Sister   . Hypertension Sister   . Hypertension Sister   . Prostate cancer Neg Hx     Current Outpatient Medications  Medication Sig Dispense Refill  . acetaminophen (TYLENOL) 325 MG tablet Take 650 mg by mouth every 6 (six) hours as needed.    Marland Kitchen acyclovir (ZOVIRAX) 400 MG tablet Take 1 tablet (400 mg total) by mouth 2 (two) times daily. (Patient not taking: Reported on 08/28/2018) 60 tablet 3  . amLODipine (NORVASC) 5 MG tablet Take 1.5-2 tablets (7.5-10 mg total) by mouth daily. (Patient taking differently: Take 5 mg by mouth daily. )    . co-enzyme Q-10 50 MG capsule Take 50 mg by mouth daily.    Marland Kitchen lenalidomide (REVLIMID) 10 MG capsule Take 1 capsule (10 mg total) by mouth daily. Take for 14 days, then hold for 7 days. Repeat every 21 days. (Patient not taking: Reported on 08/28/2018) 14 capsule 0  . metoprolol succinate (TOPROL-XL) 50 MG 24 hr tablet Take with or immediately following a meal. (Patient taking differently: Take 50 mg by mouth daily. Take with or immediately following a meal.) 90 tablet 0  . oxyCODONE (ROXICODONE) 5 MG immediate release tablet Take 0.5-1 tablets (2.5-5 mg total) by mouth 3 (three)  times daily as needed (sedation caution). 15 tablet 0  . vitamin B-12 (CYANOCOBALAMIN) 1000 MCG tablet Take 1 tablet (1,000 mcg total) by mouth daily. 30 tablet 3   No current facility-administered medications for this visit.      PERFORMANCE STATUS (ECOG) : 1 - Symptomatic but completely ambulatory  Review of Systems As noted above. Otherwise, a complete review of systems is negative.  Physical Exam General: NAD Pulmonary: unlabored Extremities: no edema, no joint deformities Skin: no rashes Neurological: Weakness but otherwise nonfocal   Assessment and Plan:  1. Cancer: MM being followed by Dr. Tasia Catchings  2. High Risk for ER/Hospitalization during Chemotherapy: We discussed the role of the chemo care clinic and identified patient specific risk factors. I discussed that patient was identified as high risk primarily based on: h/o ER visit and comorbidities. We also discussed the role of the Symptom Management and Palliative Care Clinics at [facility] and methods of contacting clinic/provider. He denies needing specific assistance at this time.  Current PCP: Tonia Ghent, MD  Hospital Admissions: 0  ED Visits: 1  Has Medicaid: No  Has Medicare: No  In relationship: No  Has Anemia: Yes  Has asthma: No  Has atrial fibrillation: No  Has CVD: No  Has chronic kidney disease: Yes  Has Chronic Obstructive Pulmonary Disease: No  Has Congestive Heart Failure: No  Has Connective Tissue Disorder: No  Has Depression: No  Has Diabetes: No  Has liver disease: Yes  Has Peripheral Vascular Disease: No    3. Social Determinants of Health:   Housing - patient lives at home alone. He denies issues with housing.   Food - We discussed resources in the [place] but patient denies having any concerns at present time.   Transportation - Patient drives and reports not having concerns about transportation  Utilities - Patient denies concerns  Safety - Patient denies concerns  Financial  Strain - Patient denies concerns  Employment - He is employed at a [employer]. There has been discussion about him needing FMLA at some point. He intends to obtain that paperwork. We discussed financial resources available if needed.   Family/Community Support - Patient has a girlfriend and good support from friends   4. Co-morbidities Complicating Care: Patient found to be hypertensive. He is followed by PCP - Dr. Damita Dunnings and will return to see him at the first available opportunity.    Patient expressed understanding and was in agreement with this plan. He also understands that He can call clinic at any time with any questions, concerns, or complaints.   A total of (15) minutes of face-to-face time was spent with this patient with greater than 50% of that time in counseling and care-coordination.   Signed by: Altha Harm, PhD, DNP, NP-C, [facility] [phone] (Work Cell)

## 2018-08-28 NOTE — Telephone Encounter (Signed)
I would continue amlodipine as is.  Increase the metoprolol in the meantime.  Would take 2 pills daily.  Med list updated.  Thanks.

## 2018-08-28 NOTE — Progress Notes (Signed)
Patient here for follow. Blood pressure elevated. He states blood pressure has been elevated ever since Dr. Damita Dunnings took him off of HCTZ due to anemia.

## 2018-08-28 NOTE — Telephone Encounter (Signed)
Notified patient.  He verbalizes understanding of instructions.  He would like to personally thank Dr. Damita Dunnings for saving his life by running the additional "panel" that he did.  He will see him next week but wanted [place] to express thanks to him in the meantime.

## 2018-08-28 NOTE — Telephone Encounter (Signed)
Call pt.  Please let him know that I am following along with oncology and have been thinking about him.  Verify his current BP meds/doses.  His BP was up at the hematology clinic.  I think that would be understandable.  I would like to either recheck him here or have him check his BP at home and update me so we can adjust his meds as needed.  Thanks.

## 2018-08-30 NOTE — Telephone Encounter (Signed)
I thank all involved and appreciate this note.

## 2018-08-31 ENCOUNTER — Other Ambulatory Visit: Payer: Self-pay | Admitting: Family Medicine

## 2018-08-31 NOTE — Telephone Encounter (Signed)
Patient called office regarding a refill request. Patient is out of medication. There is a refill request for the amlodipine 5 mg but it show in patient's chart on telephone encounter 08/28/2018 this change to 10 mg.

## 2018-08-31 NOTE — Telephone Encounter (Signed)
Sent. Thanks.   

## 2018-08-31 NOTE — Telephone Encounter (Signed)
Detailed message left on voicemail that script has been sent to the pharmacy .(on DPR)

## 2018-09-02 NOTE — Telephone Encounter (Signed)
Patient received medication 09/01/18.

## 2018-09-03 ENCOUNTER — Other Ambulatory Visit: Payer: Self-pay | Admitting: Family Medicine

## 2018-09-03 ENCOUNTER — Telehealth: Payer: Self-pay | Admitting: Family Medicine

## 2018-09-03 NOTE — Telephone Encounter (Signed)
Pt called office stating he has another appointment with his cancer doctor. Cancelled the appointment he had for his BP check on Friday the 22nd. Pt is requesting a call from nurse.

## 2018-09-03 NOTE — Telephone Encounter (Signed)
Patient says he is sorry but he had to cancel his appt here tomorrow because he is seeing the cancer MD and he will be having an infusion for an hour and that will run into his appointment time here.  Patient advised to reschedule when he can.

## 2018-09-04 ENCOUNTER — Encounter: Payer: Self-pay | Admitting: Oncology

## 2018-09-04 ENCOUNTER — Ambulatory Visit: Payer: BLUE CROSS/BLUE SHIELD | Admitting: Family Medicine

## 2018-09-04 ENCOUNTER — Inpatient Hospital Stay: Payer: BLUE CROSS/BLUE SHIELD

## 2018-09-04 ENCOUNTER — Other Ambulatory Visit: Payer: Self-pay

## 2018-09-04 ENCOUNTER — Telehealth: Payer: Self-pay | Admitting: *Deleted

## 2018-09-04 ENCOUNTER — Inpatient Hospital Stay (HOSPITAL_BASED_OUTPATIENT_CLINIC_OR_DEPARTMENT_OTHER): Payer: BLUE CROSS/BLUE SHIELD | Admitting: Oncology

## 2018-09-04 VITALS — BP 175/104 | HR 74 | Temp 97.0°F | Resp 18 | Wt 203.5 lb

## 2018-09-04 DIAGNOSIS — N183 Chronic kidney disease, stage 3 unspecified: Secondary | ICD-10-CM

## 2018-09-04 DIAGNOSIS — D649 Anemia, unspecified: Secondary | ICD-10-CM | POA: Diagnosis not present

## 2018-09-04 DIAGNOSIS — D696 Thrombocytopenia, unspecified: Secondary | ICD-10-CM

## 2018-09-04 DIAGNOSIS — R5383 Other fatigue: Secondary | ICD-10-CM | POA: Diagnosis not present

## 2018-09-04 DIAGNOSIS — C9000 Multiple myeloma not having achieved remission: Secondary | ICD-10-CM

## 2018-09-04 DIAGNOSIS — I129 Hypertensive chronic kidney disease with stage 1 through stage 4 chronic kidney disease, or unspecified chronic kidney disease: Secondary | ICD-10-CM | POA: Diagnosis not present

## 2018-09-04 DIAGNOSIS — Z800 Family history of malignant neoplasm of digestive organs: Secondary | ICD-10-CM

## 2018-09-04 DIAGNOSIS — Z5111 Encounter for antineoplastic chemotherapy: Secondary | ICD-10-CM | POA: Diagnosis not present

## 2018-09-04 DIAGNOSIS — Z515 Encounter for palliative care: Secondary | ICD-10-CM | POA: Diagnosis not present

## 2018-09-04 DIAGNOSIS — G629 Polyneuropathy, unspecified: Secondary | ICD-10-CM | POA: Diagnosis not present

## 2018-09-04 DIAGNOSIS — Z79899 Other long term (current) drug therapy: Secondary | ICD-10-CM | POA: Diagnosis not present

## 2018-09-04 DIAGNOSIS — Z7982 Long term (current) use of aspirin: Secondary | ICD-10-CM | POA: Diagnosis not present

## 2018-09-04 DIAGNOSIS — E785 Hyperlipidemia, unspecified: Secondary | ICD-10-CM

## 2018-09-04 DIAGNOSIS — K760 Fatty (change of) liver, not elsewhere classified: Secondary | ICD-10-CM | POA: Diagnosis not present

## 2018-09-04 LAB — COMPREHENSIVE METABOLIC PANEL
ALBUMIN: 4.6 g/dL (ref 3.5–5.0)
ALT: 22 U/L (ref 0–44)
AST: 23 U/L (ref 15–41)
Alkaline Phosphatase: 62 U/L (ref 38–126)
Anion gap: 7 (ref 5–15)
BUN: 24 mg/dL — AB (ref 8–23)
CHLORIDE: 106 mmol/L (ref 98–111)
CO2: 24 mmol/L (ref 22–32)
Calcium: 9.6 mg/dL (ref 8.9–10.3)
Creatinine, Ser: 1.61 mg/dL — ABNORMAL HIGH (ref 0.61–1.24)
GFR calc Af Amer: 51 mL/min — ABNORMAL LOW (ref >60)
GFR, EST NON AFRICAN AMERICAN: 44 mL/min — AB (ref >60)
Glucose, Bld: 113 mg/dL — ABNORMAL HIGH (ref 70–99)
POTASSIUM: 4.3 mmol/L (ref 3.5–5.1)
SODIUM: 137 mmol/L (ref 135–145)
Total Bilirubin: 0.7 mg/dL (ref 0.3–1.2)
Total Protein: 7.1 g/dL (ref 6.5–8.1)

## 2018-09-04 LAB — CBC WITH DIFFERENTIAL/PLATELET
ABS IMMATURE GRANULOCYTES: 0.24 10*3/uL — AB (ref 0.00–0.07)
Basophils Absolute: 0 10*3/uL (ref 0.0–0.1)
Basophils Relative: 0 %
Eosinophils Absolute: 0.1 10*3/uL (ref 0.0–0.5)
Eosinophils Relative: 2 %
HEMATOCRIT: 28.8 % — AB (ref 39.0–52.0)
HEMOGLOBIN: 9.6 g/dL — AB (ref 13.0–17.0)
Immature Granulocytes: 4 %
LYMPHS ABS: 1.6 10*3/uL (ref 0.7–4.0)
LYMPHS PCT: 27 %
MCH: 31.6 pg (ref 26.0–34.0)
MCHC: 33.3 g/dL (ref 30.0–36.0)
MCV: 94.7 fL (ref 80.0–100.0)
MONO ABS: 0.6 10*3/uL (ref 0.1–1.0)
Monocytes Relative: 10 %
Neutro Abs: 3.3 10*3/uL (ref 1.7–7.7)
Neutrophils Relative %: 57 %
PLATELETS: 97 10*3/uL — AB (ref 150–400)
RBC: 3.04 MIL/uL — AB (ref 4.22–5.81)
RDW: 14.5 % (ref 11.5–15.5)
WBC: 5.9 10*3/uL (ref 4.0–10.5)
nRBC: 0 % (ref 0.0–0.2)

## 2018-09-04 MED ORDER — PROCHLORPERAZINE MALEATE 10 MG PO TABS
10.0000 mg | ORAL_TABLET | Freq: Once | ORAL | Status: AC
  Administered 2018-09-04: 10 mg via ORAL
  Filled 2018-09-04: qty 1

## 2018-09-04 MED ORDER — BORTEZOMIB CHEMO SQ INJECTION 3.5 MG (2.5MG/ML)
1.3000 mg/m2 | Freq: Once | INTRAMUSCULAR | Status: AC
  Administered 2018-09-04: 2.75 mg via SUBCUTANEOUS
  Filled 2018-09-04: qty 1.1

## 2018-09-04 NOTE — Telephone Encounter (Signed)
Agreed, thanks

## 2018-09-04 NOTE — Telephone Encounter (Signed)
Spoke to pt who states he picked up a Rx for maxzide on yesterday. Pt was under the impression that it was d/c when he started his cancer Tx, but has already taken one dose. Pt wanting a call back to confirm he was to restart before he takes another dose

## 2018-09-04 NOTE — Telephone Encounter (Signed)
I thought he was off HCTZ and the med had been discontinued.  I would stop it for now. Thanks.

## 2018-09-04 NOTE — Progress Notes (Signed)
Patient here for follow up. Started taking acyclovir on Monday 11/18 and revlimid on Tuesday 11/19. Patient's bp is elevated, pt denies headache, dizziness.

## 2018-09-04 NOTE — Telephone Encounter (Signed)
Patient advised.

## 2018-09-04 NOTE — Progress Notes (Signed)
Reviewed parameters with Elizabeth/Dr. Tasia Catchings, per Dr. Tasia Catchings proceed with treatment today.  Patient states he takes his steroid at home.

## 2018-09-06 NOTE — Progress Notes (Signed)
Hematology/Oncology  Follow up note [facility] [phone] [phone]   Patient Care Team: Tonia Ghent, MD as PCP - General (Family Medicine)  REFERRING PROVIDER: Napoleon Form REASON FOR VISIT Follow up for bone marrow results/PET scan and discussion of diagnosis of multiple myeloma.   HISTORY OF PRESENTING ILLNESS:  Luis Burke is a  62 y.o.  male with PMH listed below who was referred to me for evaluation of anemia and thrombocytopenia Associated signs and symptoms: Patient reports feeling fatigue.  Denies SOB with exertion.  Denies weight loss, easy bruising, hematochezia, hemoptysis, hematuria. Context: History of GI bleeding: Denies               History of Chronic kidney disease yes               History of autoimmune disease denies denies               History of hemolytic anemia.                Last colonoscopy: 07/20/2018 upper and lower endoscopy showed esophagitis, normal examined duodenum, 2 polyps removed in the ascending colon, resected and retrieved.  Otherwise normal. He drinks alcohol, usually a few beers during the weekends.  Denies any smoking history.   Intended weight loss 3 pounds for the past 3 months.  # 07/27/2018 multiple myeloma panel showed M protein of 0.1, IgG 662, IgA 49, IgM 7. Patient was called back to further labs done. 08/10/2018, free light chain ratio showed extremely high level of kappa free light chain 10,183, with a kappa lambda light chain ratio of 1414.31 LDH 164 Beta-2 microglobulin 5 Patient was called and discuss about results.  He was recommended to undergo bone marrow biopsy and PET scan. 08/19/2018 bone marrow biopsy showed hypercellular marrow 80%, involved by plasma cell neoplasm up to 95%.  Consistent with plasma cell myeloma.  FISH and cytogenetics are pending.  09/10/2018 skeletal survey showed questionable small lucent lesions within the midshaft of the humerus bilaterally.   Otherwise no suspicious focal lytic lesion or acute bone abnormality. 08/25/2018 PET scan showed no definite hypermetabolic bone disease but CT findings are highly suspicious for numerous small myelomatous lesions involving the spine, sternum and scattered ribs.   INTERVAL HISTORY Luis Burke is a 62 y.o. male who has above history reviewed by me today for a follow-up visit for management of multiple myeloma. 08/28/2018  Patient has started on Velcade and dexamethasone treatment. He also received Revlimid and started Revlimid on 09/06/2018.  Tolerates well.  Denies experiencing any side effects so far, except transient left fingertip numbness which spontaneously resolved.  Today he is accompanied by his significant other.  Patient also brought his medications and have questions about medications. Blood pressure has been running high for the past couple visits.  I have communicated with patient's primary care physician and his Toprol XL has been increased to 100 mg daily.  Norvasc has been increased to 10 mg daily.  Denies any bone pain.    Review of Systems  Constitutional: Negative for appetite change, chills, fatigue, fever and unexpected weight change.  HENT:   Negative for hearing loss and voice change.   Eyes: Negative for eye problems and icterus.  Respiratory: Negative for chest tightness, cough and shortness of breath.   Cardiovascular: Negative for chest pain and leg swelling.  Gastrointestinal: Negative for abdominal distention and abdominal pain.  Endocrine: Negative for hot flashes.  Genitourinary: Negative  for difficulty urinating, dysuria and frequency.   Musculoskeletal: Negative for arthralgias.  Skin: Negative for itching and rash.  Neurological: Positive for numbness. Negative for light-headedness.  Hematological: Negative for adenopathy. Does not bruise/bleed easily.  Psychiatric/Behavioral: Negative for confusion.    MEDICAL HISTORY:  Past Medical History:    Diagnosis Date  . Anemia   . Fatty liver    on u/s 07/2018  . Hyperlipidemia 03/2004  . Hypertension 1994  . Snores     SURGICAL HISTORY: Past Surgical History:  Procedure Laterality Date  . CATARACT EXTRACTION W/PHACO Right 03/04/2018   Procedure: CATARACT EXTRACTION PHACO AND INTRAOCULAR LENS PLACEMENT ([place])  RIGHT TORIC;  Surgeon: Leandrew Koyanagi, MD;  Location: [facility];  Service: Ophthalmology;  Laterality: Right;  Per [place] no Toric Lens 1:45 5.3  . COLONOSCOPY WITH PROPOFOL N/A 07/20/2018   Procedure: COLONOSCOPY WITH PROPOFOL;  Surgeon: Jonathon Bellows, MD;  Location[facility] ENDOSCOPY;  Service: Gastroenterology;  Laterality: N/A;  . ESOPHAGOGASTRODUODENOSCOPY (EGD) WITH PROPOFOL N/A 07/20/2018   Procedure: ESOPHAGOGASTRODUODENOSCOPY (EGD) WITH PROPOFOL;  Surgeon: Jonathon Bellows, MD;  Location: [facility] ENDOSCOPY;  Service: Gastroenterology;  Laterality: N/A;  . EYE SURGERY Right   . HERNIA REPAIR    . L Lap herniorraphy  04/2000  . Left wrist ganglionectomy      SOCIAL HISTORY: Social History   Socioeconomic History  . Marital status: Single    Spouse name: Not on file  . Number of children: 1  . Years of education: Not on file  . Highest education level: Not on file  Occupational History  . Occupation: [employer]  Social Needs  . Financial resource strain: Not on file  . Food insecurity:    Worry: Not on file    Inability: Not on file  . Transportation needs:    Medical: Not on file    Non-medical: Not on file  Tobacco Use  . Smoking status: Never Smoker  . Smokeless tobacco: Never Used  . Tobacco comment: Occassionally  Substance and Sexual Activity  . Alcohol use: Not Currently    Alcohol/week: 3.0 standard drinks    Types: 3 Cans of beer per week  . Drug use: No  . Sexual activity: Not on file  Lifestyle  . Physical activity:    Days per week: Not on file    Minutes per session: Not on file  . Stress: Not on file  Relationships  . Social  connections:    Talks on phone: Not on file    Gets together: Not on file    Attends religious service: Not on file    Active member of club or organization: Not on file    Attends meetings of clubs or organizations: Not on file    Relationship status: Not on file  . Intimate partner violence:    Fear of current or ex partner: Not on file    Emotionally abused: Not on file    Physically abused: Not on file    Forced sexual activity: Not on file  Other Topics Concern  . Not on file  Social History Narrative   Divorced 09-Dec-2007, dating as of December 08, 2017   His daughter died 4 days after giving birth to the patient's granddaughter   Has joint custody of his dead daughter's child   Works at [employer] is [employer]    FAMILY HISTORY: Family History  Problem Relation Age of Onset  . Hypertension Mother   . Diabetes Mother   . Heart disease  Mother        CAD  . Dementia Mother   . Hypertension Father   . Heart disease Father        MI 02/03  . Colon cancer Father   . Hypertension Sister   . Hypertension Sister   . Hypertension Sister   . Prostate cancer Neg Hx     ALLERGIES:  is allergic to tadalafil.  MEDICATIONS:  Current Outpatient Medications  Medication Sig Dispense Refill  . acetaminophen (TYLENOL) 325 MG tablet Take 650 mg by mouth every 6 (six) hours as needed.    Marland Kitchen acyclovir (ZOVIRAX) 400 MG tablet Take 1 tablet (400 mg total) by mouth 2 (two) times daily. 60 tablet 3  . amLODipine (NORVASC) 5 MG tablet Take 2 tablets (10 mg total) by mouth daily. 180 tablet 3  . co-enzyme Q-10 50 MG capsule Take 50 mg by mouth daily.    Marland Kitchen dexamethasone (DECADRON) 4 MG tablet Take 10 tablets (40 mg total) by mouth once a week. 120 tablet 0  . lenalidomide (REVLIMID) 10 MG capsule Take 1 capsule (10 mg total) by mouth daily. Take for 14 days, then hold for 7 days. Repeat every 21 days. 14 capsule 0  . metoprolol succinate (TOPROL-XL) 50 MG 24 hr tablet Take 2 tablets (100 mg total) by  mouth daily. Take with or immediately following a meal.    . vitamin B-12 (CYANOCOBALAMIN) 1000 MCG tablet Take 1 tablet (1,000 mcg total) by mouth daily. 30 tablet 3  . oxyCODONE (ROXICODONE) 5 MG immediate release tablet Take 0.5-1 tablets (2.5-5 mg total) by mouth 3 (three) times daily as needed (sedation caution). (Patient not taking: Reported on 09/04/2018) 15 tablet 0  . triamterene-hydrochlorothiazide (MAXZIDE-25) 37.5-25 MG tablet TAKE 1 TABLET BY MOUTH EVERY DAY IN THE MORNING (Patient not taking: Reported on 09/04/2018) 90 tablet 1   No current facility-administered medications for this visit.      PHYSICAL EXAMINATION: ECOG PERFORMANCE STATUS: 0 - Asymptomatic Vitals:   09/04/18 0931  BP: (!) 175/104  Pulse: 74  Resp: 18  Temp: (!) 97 F (36.1 C)   Filed Weights   09/04/18 0931  Weight: 203 lb 8 oz (92.3 kg)    Physical Exam  Constitutional: He is oriented to person, place, and time. No distress.  HENT:  Head: Normocephalic and atraumatic.  Mouth/Throat: Oropharynx is clear and moist.  Eyes: Pupils are equal, round, and reactive to light. EOM are normal. No scleral icterus.  Neck: Normal range of motion. Neck supple.  Cardiovascular: Normal rate, regular rhythm and normal heart sounds.  Pulmonary/Chest: Effort normal. No respiratory distress. He has no wheezes.  Abdominal: Soft. Bowel sounds are normal. He exhibits no distension and no mass. There is no tenderness.  Musculoskeletal: Normal range of motion. He exhibits no edema or deformity.  Neurological: He is alert and oriented to person, place, and time. No cranial nerve deficit. Coordination normal.  Skin: Skin is warm and dry. No rash noted. No erythema.  Psychiatric: He has a normal mood and affect. His behavior is normal. Thought content normal.     LABORATORY DATA:  I have reviewed the data as listed Lab Results  Component Value Date   WBC 5.9 09/04/2018   HGB 9.6 (L) 09/04/2018   HCT 28.8 (L)  09/04/2018   MCV 94.7 09/04/2018   PLT 97 (L) 09/04/2018   Recent Labs    07/27/18 1034 07/28/18 0845 08/28/18 0903 09/04/18 0913  NA 143 143 140  137  K 4.3 3.9 4.2 4.3  CL 107 109 107 106  CO2 26 26 25 24   GLUCOSE 105* 112* 91 113*  BUN 26* 23 26* 24*  CREATININE 1.51* 1.53* 1.75* 1.61*  CALCIUM 10.4* 10.1 9.9 9.6  GFRNONAA 48* 47* 40* 44*  GFRAA 55* 55* 46* 51*  PROT 7.7  --  7.2 7.1  ALBUMIN 4.9  --  4.6 4.6  AST 22  --  24 23  ALT 22  --  30 22  ALKPHOS 56  --  57 62  BILITOT 0.7  --  0.6 0.7   Iron/TIBC/Ferritin/ %Sat    Component Value Date/Time   IRON 74 07/01/2018 0944   TIBC 250 07/01/2018 0944   FERRITIN 357 07/01/2018 0944   IRONPCTSAT 30 07/01/2018 0944    Lab Results  Component Value Date   TOTALPROTELP 6.9 07/27/2018   Lab Results  Component Value Date   KPAFRELGTCHN 10,183.0 (H) 08/10/2018   LAMBDASER 7.2 08/10/2018   KAPLAMBRATIO 1,308.59 (H) 08/23/2018       ASSESSMENT & PLAN:  1. Multiple myeloma not having achieved remission ([place])   2. Thrombocytopenia ([place])   3. Anemia, unspecified type   4. 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 CKD (chronic kidney disease) stage 3, GFR 30-59 ml/min ([facility])   Beta 2 microglobulin 5 and normal albumin.  Stage II. cytogenetics showed normal male chromosome, MDS FISH panel showed trisomy 54- Standard Risk.  #Multiple myeloma, on VRD treatment.  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 Discussed about that his neuropathy may get worse as he gets more treatment, patient need to communicate with me if symptoms are getting worse.  Labs are reviewed and discussed with patient, acceptable to proceed with today's Velcade treatment.  Also remind patient to pick up his prescription of dexamethasone and take 40 mg weekly.  Patient also should take acyclovir 400 mg twice daily as well as aspirin 81 mg daily. Patient's significant other has many questions about which medication and I  tried my best to answer all her questions.    #We discussed about rationale and potential side effects of bone strengthening agent such as bisphosphonate or Xgeva treatment. Encourage patient to obtain dental clearance. Avoid heavy lifting or contact sports at this point.  We spent sufficient time to discuss many aspect of care, questions were answered to patient's satisfaction.  Follow up in 1 week for next dose of Velcade.  Continue taking Revlimid finished 14-day course.  Weekly dexamethasone. Patient will follow-up with me for cycle 2 of Velcade treatment on 09/22/2018.  All questions were answered. The patient knows to call the clinic with any problems questions or concerns. No orders of the defined types were placed in this encounter.   Return of visit: 1 week  Total face to face encounter time for this patient visit was 66mn. >50% of the time was  spent in counseling and coordination of care.  ZEarlie Server MD, PhD Hematology Oncology CSt. Luke'S Cornwall Hospital - Newburgh Campusat AChristus Dubuis Of Forth SmithPager- [phone]/24/2019

## 2018-09-09 ENCOUNTER — Other Ambulatory Visit: Payer: Self-pay | Admitting: Family Medicine

## 2018-09-14 ENCOUNTER — Other Ambulatory Visit: Payer: Self-pay | Admitting: Family Medicine

## 2018-09-14 NOTE — Telephone Encounter (Signed)
Pt contacted office back regarding metoprolol Rx. States he is completely out of meds, and is wanting to ensure Rx will be sent today. Advised pt of [id number] refill policy

## 2018-09-14 NOTE — Telephone Encounter (Signed)
Sent. Thanks.   

## 2018-09-14 NOTE — Telephone Encounter (Signed)
Electronic refill request Metoprolol Last office visit 07/06/18 Refill request does not match medication list which shows 2 tablets daily Please confirm correct directions

## 2018-09-15 ENCOUNTER — Inpatient Hospital Stay: Payer: BLUE CROSS/BLUE SHIELD | Attending: Oncology

## 2018-09-15 ENCOUNTER — Other Ambulatory Visit: Payer: Self-pay | Admitting: Oncology

## 2018-09-15 VITALS — BP 179/86 | HR 75 | Temp 97.2°F | Resp 18 | Wt 203.8 lb

## 2018-09-15 DIAGNOSIS — Z7982 Long term (current) use of aspirin: Secondary | ICD-10-CM | POA: Diagnosis not present

## 2018-09-15 DIAGNOSIS — Z79899 Other long term (current) drug therapy: Secondary | ICD-10-CM | POA: Insufficient documentation

## 2018-09-15 DIAGNOSIS — I129 Hypertensive chronic kidney disease with stage 1 through stage 4 chronic kidney disease, or unspecified chronic kidney disease: Secondary | ICD-10-CM | POA: Insufficient documentation

## 2018-09-15 DIAGNOSIS — C9000 Multiple myeloma not having achieved remission: Secondary | ICD-10-CM | POA: Insufficient documentation

## 2018-09-15 DIAGNOSIS — K760 Fatty (change of) liver, not elsewhere classified: Secondary | ICD-10-CM | POA: Diagnosis not present

## 2018-09-15 DIAGNOSIS — R0609 Other forms of dyspnea: Secondary | ICD-10-CM | POA: Insufficient documentation

## 2018-09-15 DIAGNOSIS — G620 Drug-induced polyneuropathy: Secondary | ICD-10-CM | POA: Diagnosis not present

## 2018-09-15 DIAGNOSIS — Z5111 Encounter for antineoplastic chemotherapy: Secondary | ICD-10-CM | POA: Diagnosis not present

## 2018-09-15 DIAGNOSIS — R5383 Other fatigue: Secondary | ICD-10-CM | POA: Insufficient documentation

## 2018-09-15 DIAGNOSIS — N183 Chronic kidney disease, stage 3 (moderate): Secondary | ICD-10-CM | POA: Insufficient documentation

## 2018-09-15 DIAGNOSIS — T451X5S Adverse effect of antineoplastic and immunosuppressive drugs, sequela: Secondary | ICD-10-CM | POA: Insufficient documentation

## 2018-09-15 DIAGNOSIS — D6959 Other secondary thrombocytopenia: Secondary | ICD-10-CM | POA: Diagnosis not present

## 2018-09-15 DIAGNOSIS — D649 Anemia, unspecified: Secondary | ICD-10-CM | POA: Insufficient documentation

## 2018-09-15 DIAGNOSIS — E785 Hyperlipidemia, unspecified: Secondary | ICD-10-CM | POA: Insufficient documentation

## 2018-09-15 MED ORDER — PROCHLORPERAZINE MALEATE 10 MG PO TABS
10.0000 mg | ORAL_TABLET | Freq: Once | ORAL | Status: AC
  Administered 2018-09-15: 10 mg via ORAL
  Filled 2018-09-15: qty 1

## 2018-09-15 MED ORDER — BORTEZOMIB CHEMO SQ INJECTION 3.5 MG (2.5MG/ML)
1.3000 mg/m2 | Freq: Once | INTRAMUSCULAR | Status: AC
  Administered 2018-09-15: 2.75 mg via SUBCUTANEOUS
  Filled 2018-09-15: qty 1.1

## 2018-09-15 NOTE — Telephone Encounter (Signed)
Dr Yu patient?

## 2018-09-15 NOTE — Progress Notes (Signed)
Per [place] per Dr. Tasia Catchings okay to proceed with velcade treatment with 09/04/18 labs.

## 2018-09-16 ENCOUNTER — Telehealth: Payer: Self-pay | Admitting: *Deleted

## 2018-09-16 NOTE — Telephone Encounter (Signed)
Pharmacy called asking for refill of his Revlimid again

## 2018-09-16 NOTE — Telephone Encounter (Signed)
1st request was sent yesterday afternoon, dr Tasia Catchings was not in the office at the time.  I will confirm and send refill today.

## 2018-09-18 ENCOUNTER — Encounter: Payer: Self-pay | Admitting: Family Medicine

## 2018-09-18 ENCOUNTER — Ambulatory Visit: Payer: BLUE CROSS/BLUE SHIELD | Admitting: Family Medicine

## 2018-09-18 DIAGNOSIS — I10 Essential (primary) hypertension: Secondary | ICD-10-CM

## 2018-09-18 MED ORDER — METOPROLOL SUCCINATE ER 50 MG PO TB24: 100.0000 mg | ORAL_TABLET | Freq: Every day | ORAL | 3 refills | Status: DC

## 2018-09-18 MED ORDER — AMLODIPINE BESYLATE 5 MG PO TABS: 5.0000 mg | ORAL_TABLET | Freq: Every day | ORAL | 3 refills | Status: DC

## 2018-09-18 NOTE — Patient Instructions (Signed)
Take 10mg  amlodpine for now.  2 tabs.  Take 150mg  of metoprolol.  3 tabs.   Let me know about your BP and pulse in about 1 week.   Goal pulse is between 60-100.    Drink enough fluid to keep your urine clear or light colored.   Take care.  Glad to see you.   You deserve a lot of credit.  Thanks for your effort.

## 2018-09-18 NOTE — Progress Notes (Signed)
He is working through the cancer dx, d/w pt.  He is going to "keep pushing."  He has fatigue in the evening but is still working.    He is 10mg  amlodipine and 100mg  metoprolol per day.  Taken all at once, at night.    He has been checking his pressure at home.  Usually 150s/90s in the AM.  It was higher prev, is improving some on current medications.  No CP, minimal BLE edema at the end of the day, no SOB.     Meds, vitals, and allergies reviewed.   ROS: Per HPI unless specifically indicated in ROS section   GEN: nad, alert and oriented HEENT: mucous membranes moist NECK: supple w/o LA CV: rrr PULM: ctab, no inc wob ABD: soft, +bs EXT: no edema SKIN: no acute rash

## 2018-09-20 NOTE — Assessment & Plan Note (Signed)
Discussed options.  I would like to avoid agents that could affect his creatinine at this point, if possible. Discussed rationale for increasing metoprolol given that he is not bradycardic. Take 10mg  amlodpine for now Take 150mg  of metoprolol.    He will update me about BP and pulse in about 1 week.   Goal pulse is between 60-100.    Advised to drink enough fluid to keep urine clear or light colored.    I appreciate the help of all involved.  He agrees with plan.

## 2018-09-22 ENCOUNTER — Inpatient Hospital Stay: Payer: BLUE CROSS/BLUE SHIELD

## 2018-09-22 ENCOUNTER — Inpatient Hospital Stay (HOSPITAL_BASED_OUTPATIENT_CLINIC_OR_DEPARTMENT_OTHER): Payer: BLUE CROSS/BLUE SHIELD | Admitting: Oncology

## 2018-09-22 ENCOUNTER — Encounter: Payer: Self-pay | Admitting: Oncology

## 2018-09-22 ENCOUNTER — Other Ambulatory Visit: Payer: Self-pay

## 2018-09-22 VITALS — BP 160/98 | HR 75 | Temp 96.1°F | Resp 18 | Wt 205.0 lb

## 2018-09-22 DIAGNOSIS — D696 Thrombocytopenia, unspecified: Secondary | ICD-10-CM

## 2018-09-22 DIAGNOSIS — T451X5S Adverse effect of antineoplastic and immunosuppressive drugs, sequela: Secondary | ICD-10-CM

## 2018-09-22 DIAGNOSIS — K760 Fatty (change of) liver, not elsewhere classified: Secondary | ICD-10-CM

## 2018-09-22 DIAGNOSIS — C9000 Multiple myeloma not having achieved remission: Secondary | ICD-10-CM | POA: Diagnosis not present

## 2018-09-22 DIAGNOSIS — D6959 Other secondary thrombocytopenia: Secondary | ICD-10-CM

## 2018-09-22 DIAGNOSIS — D649 Anemia, unspecified: Secondary | ICD-10-CM

## 2018-09-22 DIAGNOSIS — N183 Chronic kidney disease, stage 3 unspecified: Secondary | ICD-10-CM

## 2018-09-22 DIAGNOSIS — E785 Hyperlipidemia, unspecified: Secondary | ICD-10-CM | POA: Diagnosis not present

## 2018-09-22 DIAGNOSIS — G620 Drug-induced polyneuropathy: Secondary | ICD-10-CM | POA: Diagnosis not present

## 2018-09-22 DIAGNOSIS — I129 Hypertensive chronic kidney disease with stage 1 through stage 4 chronic kidney disease, or unspecified chronic kidney disease: Secondary | ICD-10-CM | POA: Diagnosis not present

## 2018-09-22 DIAGNOSIS — Z5111 Encounter for antineoplastic chemotherapy: Secondary | ICD-10-CM | POA: Diagnosis not present

## 2018-09-22 DIAGNOSIS — Z79899 Other long term (current) drug therapy: Secondary | ICD-10-CM

## 2018-09-22 DIAGNOSIS — R5383 Other fatigue: Secondary | ICD-10-CM

## 2018-09-22 DIAGNOSIS — R0609 Other forms of dyspnea: Secondary | ICD-10-CM | POA: Diagnosis not present

## 2018-09-22 DIAGNOSIS — Z7982 Long term (current) use of aspirin: Secondary | ICD-10-CM

## 2018-09-22 LAB — COMPREHENSIVE METABOLIC PANEL
ALT: 25 U/L (ref 0–44)
AST: 17 U/L (ref 15–41)
Albumin: 4 g/dL (ref 3.5–5.0)
Alkaline Phosphatase: 68 U/L (ref 38–126)
Anion gap: 8 (ref 5–15)
BUN: 25 mg/dL — ABNORMAL HIGH (ref 8–23)
CO2: 23 mmol/L (ref 22–32)
Calcium: 9.3 mg/dL (ref 8.9–10.3)
Chloride: 109 mmol/L (ref 98–111)
Creatinine, Ser: 1.47 mg/dL — ABNORMAL HIGH (ref 0.61–1.24)
GFR calc non Af Amer: 50 mL/min — ABNORMAL LOW (ref >60)
GFR, EST AFRICAN AMERICAN: 58 mL/min — AB (ref >60)
Glucose, Bld: 110 mg/dL — ABNORMAL HIGH (ref 70–99)
Potassium: 4.4 mmol/L (ref 3.5–5.1)
SODIUM: 140 mmol/L (ref 135–145)
Total Bilirubin: 0.7 mg/dL (ref 0.3–1.2)
Total Protein: 6.3 g/dL — ABNORMAL LOW (ref 6.5–8.1)

## 2018-09-22 LAB — CBC WITH DIFFERENTIAL/PLATELET
Abs Immature Granulocytes: 0.04 10*3/uL (ref 0.00–0.07)
Basophils Absolute: 0 10*3/uL (ref 0.0–0.1)
Basophils Relative: 1 %
Eosinophils Absolute: 0.1 10*3/uL (ref 0.0–0.5)
Eosinophils Relative: 3 %
HEMATOCRIT: 26.5 % — AB (ref 39.0–52.0)
Hemoglobin: 8.8 g/dL — ABNORMAL LOW (ref 13.0–17.0)
Immature Granulocytes: 1 %
Lymphocytes Relative: 29 %
Lymphs Abs: 0.9 10*3/uL (ref 0.7–4.0)
MCH: 31.9 pg (ref 26.0–34.0)
MCHC: 33.2 g/dL (ref 30.0–36.0)
MCV: 96 fL (ref 80.0–100.0)
Monocytes Absolute: 0.5 10*3/uL (ref 0.1–1.0)
Monocytes Relative: 15 %
Neutro Abs: 1.7 10*3/uL (ref 1.7–7.7)
Neutrophils Relative %: 51 %
Platelets: 113 10*3/uL — ABNORMAL LOW (ref 150–400)
RBC: 2.76 MIL/uL — ABNORMAL LOW (ref 4.22–5.81)
RDW: 15 % (ref 11.5–15.5)
WBC: 3.2 10*3/uL — ABNORMAL LOW (ref 4.0–10.5)
nRBC: 0 % (ref 0.0–0.2)

## 2018-09-22 MED ORDER — BORTEZOMIB CHEMO SQ INJECTION 3.5 MG (2.5MG/ML)
1.3000 mg/m2 | Freq: Once | INTRAMUSCULAR | Status: AC
  Administered 2018-09-22: 2.75 mg via SUBCUTANEOUS
  Filled 2018-09-22: qty 1.1

## 2018-09-22 MED ORDER — PROCHLORPERAZINE MALEATE 10 MG PO TABS
10.0000 mg | ORAL_TABLET | Freq: Once | ORAL | Status: AC
  Administered 2018-09-22: 10 mg via ORAL
  Filled 2018-09-22: qty 1

## 2018-09-22 NOTE — Progress Notes (Signed)
B/P 160/98, pt states WNL and PCP is following B/P and adjusting home B/P medications as needed. No S/S of hypertension complications such as headache or dizziness at this time. Per Dr. Tasia Catchings okay to proceed with Velcade treatment.

## 2018-09-22 NOTE — Progress Notes (Signed)
Patient here for follow up. Pt complains of swollen legs. He saw Dr. Damita Dunnings this past Friday and increased blood pressure medication. Pt unsure when he should start Revlimid.

## 2018-09-22 NOTE — Progress Notes (Addendum)
Hematology/Oncology  Follow up note [facility] [phone] [phone]   Patient Care Team: Tonia Ghent, MD as PCP - General (Family Medicine)  REFERRING PROVIDER: Napoleon Form REASON FOR VISIT Follow up for bone marrow results/PET scan and discussion of diagnosis of multiple myeloma.   HISTORY OF PRESENTING ILLNESS:  Luis Burke is a  62 y.o.  male with PMH listed below who was referred to me for evaluation of anemia and thrombocytopenia Associated signs and symptoms: Patient reports feeling fatigue.  Denies SOB with exertion.  Denies weight loss, easy bruising, hematochezia, hemoptysis, hematuria. Context: History of GI bleeding: Denies               History of Chronic kidney disease yes               History of autoimmune disease denies denies               History of hemolytic anemia.                Last colonoscopy: 07/20/2018 upper and lower endoscopy showed esophagitis, normal examined duodenum, 2 polyps removed in the ascending colon, resected and retrieved.  Otherwise normal. He drinks alcohol, usually a few beers during the weekends.  Denies any smoking history.   Intended weight loss 3 pounds for the past 3 months.  # 07/27/2018 multiple myeloma panel showed M protein of 0.1, IgG 662, IgA 49, IgM 7. Patient was called back to further labs done. 08/10/2018, free light chain ratio showed extremely high level of kappa free light chain 10,183, with a kappa lambda light chain ratio of 1414.31 LDH 164 Beta-2 microglobulin 5 Patient was called and discuss about results.  He was recommended to undergo bone marrow biopsy and PET scan. 08/19/2018 bone marrow biopsy showed hypercellular marrow 80%, involved by plasma cell neoplasm up to 95%.  Consistent with plasma cell myeloma.  FISH and cytogenetics are pending.  09/10/2018 skeletal survey showed questionable small lucent lesions within the midshaft of the humerus bilaterally.   Otherwise no suspicious focal lytic lesion or acute bone abnormality. 08/25/2018 PET scan showed no definite hypermetabolic bone disease but CT findings are highly suspicious for numerous small myelomatous lesions involving the spine, sternum and scattered ribs.   INTERVAL HISTORY Luis Burke is a 62 y.o. male who has above history reviewed by me today for a follow-up visit for management of multiple myeloma. 08/28/2018  Patient has started on Velcade and dexamethasone treatment. He also received Revlimid and started Revlimid on 09/06/2018.  Tolerates well.  Denies experiencing any side effects so far, except transient left fingertip numbness which spontaneously resolved.  Today he is accompanied by his significant other.  Patient also brought his medications and have questions about medications. Blood pressure has been running high for the past couple visits.  I have communicated with patient's primary care physician and his Toprol XL has been increased to 100 mg daily.  Norvasc has been increased to 10 mg daily.  Denies any bone pain.    INTERVAL HISTORY Luis Burke 62 y.o.  male with above history who presents for evaluation prior to cycle 2 chemotherapy treatment for multiple myeloma. Patient is on RVD. Problems and complaints are listed below: Chemotherapy induced nausea: Denies Neoplasm related fatigue: Denies Neoplasm related neuropathy: Denies  Reports feeling well at baseline.  No neuropathy.  Some tiredness.  Denies any shortness of breath No new complaints.  Reports being compliant with Revlimid and  dexamethasone. Did not sleep well last night.  Review of Systems  Constitutional: Positive for fatigue. Negative for appetite change, chills, fever and unexpected weight change.  HENT:   Negative for hearing loss and voice change.   Eyes: Negative for eye problems and icterus.  Respiratory: Negative for chest tightness, cough and shortness of breath.   Cardiovascular:  Negative for chest pain and leg swelling.  Gastrointestinal: Negative for abdominal distention and abdominal pain.  Endocrine: Negative for hot flashes.  Genitourinary: Negative for difficulty urinating, dysuria and frequency.   Musculoskeletal: Negative for arthralgias.  Skin: Negative for itching and rash.  Neurological: Negative for light-headedness and numbness.  Hematological: Negative for adenopathy. Does not bruise/bleed easily.  Psychiatric/Behavioral: Negative for confusion.    MEDICAL HISTORY:  Past Medical History:  Diagnosis Date  . Anemia   . Fatty liver    on u/s 07/2018  . Hyperlipidemia 03/2004  . Hypertension 1994  . Snores     SURGICAL HISTORY: Past Surgical History:  Procedure Laterality Date  . CATARACT EXTRACTION W/PHACO Right 03/04/2018   Procedure: CATARACT EXTRACTION PHACO AND INTRAOCULAR LENS PLACEMENT ([place])  RIGHT TORIC;  Surgeon: Leandrew Koyanagi, MD;  Location: [facility];  Service: Ophthalmology;  Laterality: Right;  Per [place] no Toric Lens 1:45 5.3  . COLONOSCOPY WITH PROPOFOL N/A 07/20/2018   Procedure: COLONOSCOPY WITH PROPOFOL;  Surgeon: Jonathon Bellows, MD;  Location: [facility] ENDOSCOPY;  Service: Gastroenterology;  Laterality: N/A;  . ESOPHAGOGASTRODUODENOSCOPY (EGD) WITH PROPOFOL N/A 07/20/2018   Procedure: ESOPHAGOGASTRODUODENOSCOPY (EGD) WITH PROPOFOL;  Surgeon: Jonathon Bellows, MD;  Location: [facility] ENDOSCOPY;  Service: Gastroenterology;  Laterality: N/A;  . EYE SURGERY Right   . HERNIA REPAIR    . L Lap herniorraphy  04/2000  . Left wrist ganglionectomy      SOCIAL HISTORY: Social History   Socioeconomic History  . Marital status: Single    Spouse name: Not on file  . Number of children: 1  . Years of education: Not on file  . Highest education level: Not on file  Occupational History  . Occupation: [employer]  Social Needs  . Financial resource strain: Not on file  . Food insecurity:    Worry: Not on file    Inability: Not on  file  . Transportation needs:    Medical: Not on file    Non-medical: Not on file  Tobacco Use  . Smoking status: Never Smoker  . Smokeless tobacco: Never Used  . Tobacco comment: Occassionally  Substance and Sexual Activity  . Alcohol use: Not Currently    Alcohol/week: 3.0 standard drinks    Types: 3 Cans of beer per week  . Drug use: No  . Sexual activity: Not on file  Lifestyle  . Physical activity:    Days per week: Not on file    Minutes per session: Not on file  . Stress: Not on file  Relationships  . Social connections:    Talks on phone: Not on file    Gets together: Not on file    Attends religious service: Not on file    Active member of club or organization: Not on file    Attends meetings of clubs or organizations: Not on file    Relationship status: Not on file  . Intimate partner violence:    Fear of current or ex partner: Not on file    Emotionally abused: Not on file    Physically abused: Not on file    Forced sexual activity:  Not on file  Other Topics Concern  . Not on file  Social History Narrative   Divorced 2007/11/29, dating as of Nov 28, 2017   His daughter died 4 days after giving birth to the patient's granddaughter   Has joint custody of his dead daughter's child   Works at [employer] is [employer]    FAMILY HISTORY: Family History  Problem Relation Age of Onset  . Hypertension Mother   . Diabetes Mother   . Heart disease Mother        CAD  . Dementia Mother   . Hypertension Father   . Heart disease Father        MI 02/03  . Colon cancer Father   . Hypertension Sister   . Hypertension Sister   . Hypertension Sister   . Prostate cancer Neg Hx     ALLERGIES:  is allergic to tadalafil.  MEDICATIONS:  Current Outpatient Medications  Medication Sig Dispense Refill  . acetaminophen (TYLENOL) 325 MG tablet Take 650 mg by mouth every 6 (six) hours as needed.    Marland Kitchen acyclovir (ZOVIRAX) 400 MG tablet Take 1 tablet (400 mg total) by mouth 2 (two)  times daily. 60 tablet 3  . amLODipine (NORVASC) 5 MG tablet Take 1-2 tablets (5-10 mg total) by mouth daily. (Patient taking differently: Take 10 mg by mouth daily. ) 180 tablet 3  . co-enzyme Q-10 50 MG capsule Take 50 mg by mouth daily.    Marland Kitchen dexamethasone (DECADRON) 4 MG tablet Take 10 tablets (40 mg total) by mouth once a week. 120 tablet 0  . metoprolol succinate (TOPROL-XL) 50 MG 24 hr tablet Take 2-3 tablets (100-150 mg total) by mouth daily. (Patient taking differently: Take 150 mg by mouth daily. ) 270 tablet 3  . REVLIMID 10 MG capsule TAKE 1 CAPSULE BY MOUTH DAILY. TAKE FOR 14 DAYS, THEN HOLD FOR 7 DAYS. REPEAT EVERY 21 DAYS. 14 capsule PRN  . vitamin B-12 (CYANOCOBALAMIN) 1000 MCG tablet Take 1 tablet (1,000 mcg total) by mouth daily. 30 tablet 3  . oxyCODONE (ROXICODONE) 5 MG immediate release tablet Take 0.5-1 tablets (2.5-5 mg total) by mouth 3 (three) times daily as needed (sedation caution). (Patient not taking: Reported on 09/18/2018) 15 tablet 0   No current facility-administered medications for this visit.      PHYSICAL EXAMINATION: ECOG PERFORMANCE STATUS: 0 - Asymptomatic Vitals:   09/22/18 0911  BP: (!) 160/98  Pulse: 75  Resp: 18  Temp: (!) 96.1 F (35.6 C)   Filed Weights   09/22/18 0911  Weight: 205 lb (93 kg)    Physical Exam  Constitutional: He is oriented to person, place, and time. No distress.  HENT:  Head: Normocephalic and atraumatic.  Mouth/Throat: Oropharynx is clear and moist.  Eyes: Pupils are equal, round, and reactive to light. EOM are normal. No scleral icterus.  Neck: Normal range of motion. Neck supple.  Cardiovascular: Normal rate, regular rhythm and normal heart sounds.  Pulmonary/Chest: Effort normal. No respiratory distress. He has no wheezes.  Abdominal: Soft. Bowel sounds are normal. He exhibits no distension and no mass. There is no tenderness.  Musculoskeletal: Normal range of motion. He exhibits no edema or deformity.    Neurological: He is alert and oriented to person, place, and time. No cranial nerve deficit. Coordination normal.  Skin: Skin is warm and dry. No rash noted. No erythema.  Psychiatric: He has a normal mood and affect. His behavior is normal. Thought content normal.  LABORATORY DATA:  I have reviewed the data as listed Lab Results  Component Value Date   WBC 3.2 (L) 09/22/2018   HGB 8.8 (L) 09/22/2018   HCT 26.5 (L) 09/22/2018   MCV 96.0 09/22/2018   PLT 113 (L) 09/22/2018   Recent Labs    08/28/18 0903 09/04/18 0913 09/22/18 0839  NA 140 137 140  K 4.2 4.3 4.4  CL 107 106 109  CO2 _0 GLUCOSE 91 113* 110*  BUN 26* 24* 25*  CREATININE 1.75* 1.61* 1.47*  CALCIUM 9.9 9.6 9.3  GFRNONAA 40* 44* 50*  GFRAA 46* 51* 58*  PROT 7.2 7.1 6.3*  ALBUMIN 4.6 4.6 4.0  AST _1 ALT _2 ALKPHOS 57 62 68  BILITOT 0.6 0.7 0.7   Iron/TIBC/Ferritin/ %Sat    Component Value Date/Time   IRON 74 07/01/2018 0944   TIBC 250 07/01/2018 0944   FERRITIN 357 07/01/2018 0944   IRONPCTSAT 30 07/01/2018 0944    Lab Results  Component Value Date   TOTALPROTELP 6.9 07/27/2018   Lab Results  Component Value Date   KPAFRELGTCHN 10,183.0 (H) 08/10/2018   LAMBDASER 7.2 08/10/2018   KAPLAMBRATIO 1,308.59 (H) 08/23/2018       ASSESSMENT & PLAN:  1. Multiple myeloma not having achieved remission ([place])   2. Thrombocytopenia ([place])   3. 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 CKD (chronic kidney disease) stage 3, GFR 30-59 ml/min (HCC)   Beta 2 microglobulin 5 and normal albumin.  Stage II. 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  #Multiple myeloma, on VRD treatment.  Velcade 1.35m/m2 Day 1, 8, 15 Revlimid 175mdaily Day 1-14. [ plan to increase if GFR improves to >60]. Dexamethasone [id number] Status post 1 cycle.  Tolerates well.  Very mild neuropathy.[Grade 1] Labs are reviewed and discussed with patient.  Proceed with  cycle 2 VRD Multiple myeloma panel is pending. Plan 4 cycles of VRD Discussed with patient that I will refer patient to [facility] Dr.Crisina Gasparetto  to establish care for evaluation of autologous bone marrow transplant.  Continue acyclovir 400 mg twice daily as well as aspirin 81 mg daily. #We discussed about rationale and potential side effects of bone strengthening agent such as bisphosphonate or Xgeva treatment. Encourage patient to obtain dental clearance. Avoid heavy lifting or contact sports at this point.  # Thrombocytopenia/ Anemia, worsened, likely due to chemotherapy. Continue to monitor.   Follow up in 1 week for day 8 Velcade.  Repeat CBC CMP and proceed with day 15 Velcade.   Continue taking Revlimid finished 14-day course.  Weekly dexamethasone. Patient will follow-up with me for cycle 3 of Velcade treatment on 10/13/2018.  All questions were answered. The patient knows to call the clinic with any problems questions or concerns. Orders Placed This Encounter  Procedures  . Comprehensive metabolic panel    Standing Status:   Future    Standing Expiration Date:   09/23/2019  . CBC with Differential/Platelet    Standing Status:   Future    Standing Expiration Date:   09/23/2019  . Multiple Myeloma Panel (SPEP&IFE w/QIG)    Standing Status:   Future    Standing Expiration Date:   09/23/2019  . Kappa/lambda light chains    Standing Status:   Future    Standing Expiration Date:   09/23/2019    Return of visit: 3 weeks Total face to face  encounter time for this patient visit was 25 min. >50% of the time was  spent in counseling and coordination of care.   Earlie Server, MD, PhD Hematology Oncology [facility] at [facility] Pager- [phone] 09/22/2018

## 2018-09-23 LAB — KAPPA/LAMBDA LIGHT CHAINS
Kappa free light chain: 1944.8 mg/L — ABNORMAL HIGH (ref 3.3–19.4)
Kappa, lambda light chain ratio: 209.12 — ABNORMAL HIGH (ref 0.26–1.65)
Lambda free light chains: 9.3 mg/L (ref 5.7–26.3)

## 2018-09-28 LAB — MULTIPLE MYELOMA PANEL, SERUM
Albumin SerPl Elph-Mcnc: 3.8 g/dL (ref 2.9–4.4)
Albumin/Glob SerPl: 1.9 — ABNORMAL HIGH (ref 0.7–1.7)
Alpha 1: 0.2 g/dL (ref 0.0–0.4)
Alpha2 Glob SerPl Elph-Mcnc: 0.6 g/dL (ref 0.4–1.0)
B-Globulin SerPl Elph-Mcnc: 0.9 g/dL (ref 0.7–1.3)
Gamma Glob SerPl Elph-Mcnc: 0.3 g/dL — ABNORMAL LOW (ref 0.4–1.8)
Globulin, Total: 2.1 g/dL — ABNORMAL LOW (ref 2.2–3.9)
IgA: 54 mg/dL — ABNORMAL LOW (ref 61–437)
IgG (Immunoglobin G), Serum: 527 mg/dL — ABNORMAL LOW (ref 700–1600)
IgM (Immunoglobulin M), Srm: 8 mg/dL — ABNORMAL LOW (ref 20–172)
Total Protein ELP: 5.9 g/dL — ABNORMAL LOW (ref 6.0–8.5)

## 2018-09-29 ENCOUNTER — Inpatient Hospital Stay: Payer: BLUE CROSS/BLUE SHIELD

## 2018-09-29 VITALS — BP 169/95 | HR 76 | Temp 96.3°F | Resp 18

## 2018-09-29 DIAGNOSIS — E785 Hyperlipidemia, unspecified: Secondary | ICD-10-CM | POA: Diagnosis not present

## 2018-09-29 DIAGNOSIS — D6959 Other secondary thrombocytopenia: Secondary | ICD-10-CM | POA: Diagnosis not present

## 2018-09-29 DIAGNOSIS — C9000 Multiple myeloma not having achieved remission: Secondary | ICD-10-CM | POA: Diagnosis not present

## 2018-09-29 DIAGNOSIS — T451X5S Adverse effect of antineoplastic and immunosuppressive drugs, sequela: Secondary | ICD-10-CM | POA: Diagnosis not present

## 2018-09-29 DIAGNOSIS — K760 Fatty (change of) liver, not elsewhere classified: Secondary | ICD-10-CM | POA: Diagnosis not present

## 2018-09-29 DIAGNOSIS — R0609 Other forms of dyspnea: Secondary | ICD-10-CM | POA: Diagnosis not present

## 2018-09-29 DIAGNOSIS — G620 Drug-induced polyneuropathy: Secondary | ICD-10-CM | POA: Diagnosis not present

## 2018-09-29 DIAGNOSIS — R5383 Other fatigue: Secondary | ICD-10-CM | POA: Diagnosis not present

## 2018-09-29 DIAGNOSIS — Z5111 Encounter for antineoplastic chemotherapy: Secondary | ICD-10-CM | POA: Diagnosis not present

## 2018-09-29 DIAGNOSIS — D649 Anemia, unspecified: Secondary | ICD-10-CM | POA: Diagnosis not present

## 2018-09-29 DIAGNOSIS — Z79899 Other long term (current) drug therapy: Secondary | ICD-10-CM | POA: Diagnosis not present

## 2018-09-29 DIAGNOSIS — N183 Chronic kidney disease, stage 3 (moderate): Secondary | ICD-10-CM | POA: Diagnosis not present

## 2018-09-29 DIAGNOSIS — I129 Hypertensive chronic kidney disease with stage 1 through stage 4 chronic kidney disease, or unspecified chronic kidney disease: Secondary | ICD-10-CM | POA: Diagnosis not present

## 2018-09-29 DIAGNOSIS — Z7982 Long term (current) use of aspirin: Secondary | ICD-10-CM | POA: Diagnosis not present

## 2018-09-29 MED ORDER — PROCHLORPERAZINE MALEATE 10 MG PO TABS
10.0000 mg | ORAL_TABLET | Freq: Once | ORAL | Status: AC
  Administered 2018-09-29: 10 mg via ORAL
  Filled 2018-09-29: qty 1

## 2018-09-29 MED ORDER — BORTEZOMIB CHEMO SQ INJECTION 3.5 MG (2.5MG/ML)
1.3000 mg/m2 | Freq: Once | INTRAMUSCULAR | Status: AC
  Administered 2018-09-29: 2.75 mg via SUBCUTANEOUS
  Filled 2018-09-29: qty 1.1

## 2018-10-06 ENCOUNTER — Other Ambulatory Visit: Payer: Self-pay

## 2018-10-06 ENCOUNTER — Inpatient Hospital Stay: Payer: BLUE CROSS/BLUE SHIELD

## 2018-10-06 VITALS — BP 148/87 | HR 64 | Resp 18 | Wt 199.2 lb

## 2018-10-06 DIAGNOSIS — R5383 Other fatigue: Secondary | ICD-10-CM | POA: Diagnosis not present

## 2018-10-06 DIAGNOSIS — T451X5S Adverse effect of antineoplastic and immunosuppressive drugs, sequela: Secondary | ICD-10-CM | POA: Diagnosis not present

## 2018-10-06 DIAGNOSIS — E785 Hyperlipidemia, unspecified: Secondary | ICD-10-CM | POA: Diagnosis not present

## 2018-10-06 DIAGNOSIS — N183 Chronic kidney disease, stage 3 (moderate): Secondary | ICD-10-CM | POA: Diagnosis not present

## 2018-10-06 DIAGNOSIS — C9000 Multiple myeloma not having achieved remission: Secondary | ICD-10-CM

## 2018-10-06 DIAGNOSIS — D6959 Other secondary thrombocytopenia: Secondary | ICD-10-CM | POA: Diagnosis not present

## 2018-10-06 DIAGNOSIS — Z79899 Other long term (current) drug therapy: Secondary | ICD-10-CM | POA: Diagnosis not present

## 2018-10-06 DIAGNOSIS — D649 Anemia, unspecified: Secondary | ICD-10-CM | POA: Diagnosis not present

## 2018-10-06 DIAGNOSIS — G620 Drug-induced polyneuropathy: Secondary | ICD-10-CM | POA: Diagnosis not present

## 2018-10-06 DIAGNOSIS — K760 Fatty (change of) liver, not elsewhere classified: Secondary | ICD-10-CM | POA: Diagnosis not present

## 2018-10-06 DIAGNOSIS — I129 Hypertensive chronic kidney disease with stage 1 through stage 4 chronic kidney disease, or unspecified chronic kidney disease: Secondary | ICD-10-CM | POA: Diagnosis not present

## 2018-10-06 DIAGNOSIS — Z5111 Encounter for antineoplastic chemotherapy: Secondary | ICD-10-CM | POA: Diagnosis not present

## 2018-10-06 DIAGNOSIS — Z7982 Long term (current) use of aspirin: Secondary | ICD-10-CM | POA: Diagnosis not present

## 2018-10-06 DIAGNOSIS — R0609 Other forms of dyspnea: Secondary | ICD-10-CM | POA: Diagnosis not present

## 2018-10-06 LAB — COMPREHENSIVE METABOLIC PANEL
ALT: 23 U/L (ref 0–44)
AST: 19 U/L (ref 15–41)
Albumin: 4.1 g/dL (ref 3.5–5.0)
Alkaline Phosphatase: 71 U/L (ref 38–126)
Anion gap: 8 (ref 5–15)
BUN: 23 mg/dL (ref 8–23)
CO2: 25 mmol/L (ref 22–32)
CREATININE: 1.34 mg/dL — AB (ref 0.61–1.24)
Calcium: 9.1 mg/dL (ref 8.9–10.3)
Chloride: 107 mmol/L (ref 98–111)
GFR calc Af Amer: >60 mL/min (ref >60)
GFR calc non Af Amer: 56 mL/min — ABNORMAL LOW (ref >60)
Glucose, Bld: 136 mg/dL — ABNORMAL HIGH (ref 70–99)
Potassium: 4.2 mmol/L (ref 3.5–5.1)
Sodium: 140 mmol/L (ref 135–145)
Total Bilirubin: 0.7 mg/dL (ref 0.3–1.2)
Total Protein: 6.5 g/dL (ref 6.5–8.1)

## 2018-10-06 LAB — CBC WITH DIFFERENTIAL/PLATELET
Abs Immature Granulocytes: 0.03 10*3/uL (ref 0.00–0.07)
Basophils Absolute: 0 10*3/uL (ref 0.0–0.1)
Basophils Relative: 1 %
EOS PCT: 3 %
Eosinophils Absolute: 0.1 10*3/uL (ref 0.0–0.5)
HCT: 27.7 % — ABNORMAL LOW (ref 39.0–52.0)
Hemoglobin: 9 g/dL — ABNORMAL LOW (ref 13.0–17.0)
Immature Granulocytes: 1 %
Lymphocytes Relative: 16 %
Lymphs Abs: 0.7 10*3/uL (ref 0.7–4.0)
MCH: 30.9 pg (ref 26.0–34.0)
MCHC: 32.5 g/dL (ref 30.0–36.0)
MCV: 95.2 fL (ref 80.0–100.0)
MONO ABS: 0.4 10*3/uL (ref 0.1–1.0)
MONOS PCT: 9 %
Neutro Abs: 3.2 10*3/uL (ref 1.7–7.7)
Neutrophils Relative %: 70 %
Platelets: 120 10*3/uL — ABNORMAL LOW (ref 150–400)
RBC: 2.91 MIL/uL — ABNORMAL LOW (ref 4.22–5.81)
RDW: 14.7 % (ref 11.5–15.5)
WBC: 4.5 10*3/uL (ref 4.0–10.5)
nRBC: 0 % (ref 0.0–0.2)

## 2018-10-06 MED ORDER — BORTEZOMIB CHEMO SQ INJECTION 3.5 MG (2.5MG/ML)
1.3000 mg/m2 | Freq: Once | INTRAMUSCULAR | Status: AC
  Administered 2018-10-06: 2.75 mg via SUBCUTANEOUS
  Filled 2018-10-06: qty 1.1

## 2018-10-06 MED ORDER — PROCHLORPERAZINE MALEATE 10 MG PO TABS
10.0000 mg | ORAL_TABLET | Freq: Once | ORAL | Status: AC
  Administered 2018-10-06: 10 mg via ORAL
  Filled 2018-10-06: qty 1

## 2018-10-08 ENCOUNTER — Other Ambulatory Visit: Payer: Self-pay | Admitting: Oncology

## 2018-10-08 DIAGNOSIS — C9000 Multiple myeloma not having achieved remission: Secondary | ICD-10-CM

## 2018-10-09 ENCOUNTER — Other Ambulatory Visit: Payer: Self-pay | Admitting: *Deleted

## 2018-10-09 DIAGNOSIS — C9000 Multiple myeloma not having achieved remission: Secondary | ICD-10-CM

## 2018-10-09 NOTE — Telephone Encounter (Signed)
Per VO Dr Tasia Catchings not overly concerned at this time, he has appointment next week. If it gets worse, patient to call back and I advised that he can get some support knee high socks, and to keep his feet elevated as much as possible.Pt repeated back to me

## 2018-10-09 NOTE — Telephone Encounter (Signed)
Patient called reporting he need refill of his Revlimid. Patient reports swelling of bilateral legs times 3 days which goes down over night, but gets worse as day goes on. Asking what he can do for it. He is on his feet most of the day, but elevates them when he goes home. They look to be about half an inch puffier than normal. Please advise

## 2018-10-12 DIAGNOSIS — C9000 Multiple myeloma not having achieved remission: Secondary | ICD-10-CM | POA: Diagnosis not present

## 2018-10-13 ENCOUNTER — Ambulatory Visit
Admission: RE | Admit: 2018-10-13 | Discharge: 2018-10-13 | Disposition: A | Discharge status: Home / Self Care | Payer: BLUE CROSS/BLUE SHIELD | Attending: Oncology | Admitting: Oncology

## 2018-10-13 ENCOUNTER — Other Ambulatory Visit: Payer: BLUE CROSS/BLUE SHIELD

## 2018-10-13 ENCOUNTER — Encounter: Payer: Self-pay | Admitting: Oncology

## 2018-10-13 ENCOUNTER — Ambulatory Visit
Admission: RE | Admit: 2018-10-13 | Discharge: 2018-10-13 | Disposition: A | Discharge status: Home / Self Care | Payer: BLUE CROSS/BLUE SHIELD | Source: Ambulatory Visit | Attending: Oncology | Admitting: Oncology

## 2018-10-13 ENCOUNTER — Other Ambulatory Visit: Payer: Self-pay

## 2018-10-13 ENCOUNTER — Ambulatory Visit: Payer: BLUE CROSS/BLUE SHIELD | Admitting: Oncology

## 2018-10-13 ENCOUNTER — Inpatient Hospital Stay: Payer: BLUE CROSS/BLUE SHIELD

## 2018-10-13 ENCOUNTER — Inpatient Hospital Stay (HOSPITAL_BASED_OUTPATIENT_CLINIC_OR_DEPARTMENT_OTHER): Payer: BLUE CROSS/BLUE SHIELD | Admitting: Oncology

## 2018-10-13 VITALS — BP 161/79 | HR 76 | Temp 96.2°F | Resp 16 | Wt 207.2 lb

## 2018-10-13 DIAGNOSIS — Z7982 Long term (current) use of aspirin: Secondary | ICD-10-CM

## 2018-10-13 DIAGNOSIS — K760 Fatty (change of) liver, not elsewhere classified: Secondary | ICD-10-CM

## 2018-10-13 DIAGNOSIS — R0609 Other forms of dyspnea: Secondary | ICD-10-CM

## 2018-10-13 DIAGNOSIS — R0602 Shortness of breath: Secondary | ICD-10-CM | POA: Insufficient documentation

## 2018-10-13 DIAGNOSIS — D649 Anemia, unspecified: Secondary | ICD-10-CM

## 2018-10-13 DIAGNOSIS — C9000 Multiple myeloma not having achieved remission: Secondary | ICD-10-CM

## 2018-10-13 DIAGNOSIS — I129 Hypertensive chronic kidney disease with stage 1 through stage 4 chronic kidney disease, or unspecified chronic kidney disease: Secondary | ICD-10-CM

## 2018-10-13 DIAGNOSIS — E785 Hyperlipidemia, unspecified: Secondary | ICD-10-CM | POA: Diagnosis not present

## 2018-10-13 DIAGNOSIS — R5383 Other fatigue: Secondary | ICD-10-CM | POA: Diagnosis not present

## 2018-10-13 DIAGNOSIS — G620 Drug-induced polyneuropathy: Secondary | ICD-10-CM | POA: Diagnosis not present

## 2018-10-13 DIAGNOSIS — Z5111 Encounter for antineoplastic chemotherapy: Secondary | ICD-10-CM | POA: Diagnosis not present

## 2018-10-13 DIAGNOSIS — T451X5S Adverse effect of antineoplastic and immunosuppressive drugs, sequela: Secondary | ICD-10-CM

## 2018-10-13 DIAGNOSIS — D6959 Other secondary thrombocytopenia: Secondary | ICD-10-CM

## 2018-10-13 DIAGNOSIS — Z79899 Other long term (current) drug therapy: Secondary | ICD-10-CM

## 2018-10-13 DIAGNOSIS — N183 Chronic kidney disease, stage 3 (moderate): Secondary | ICD-10-CM | POA: Diagnosis not present

## 2018-10-13 DIAGNOSIS — D696 Thrombocytopenia, unspecified: Secondary | ICD-10-CM

## 2018-10-13 LAB — COMPREHENSIVE METABOLIC PANEL
ALT: 29 U/L (ref 0–44)
AST: 19 U/L (ref 15–41)
Albumin: 4 g/dL (ref 3.5–5.0)
Alkaline Phosphatase: 74 U/L (ref 38–126)
Anion gap: 8 (ref 5–15)
BUN: 25 mg/dL — ABNORMAL HIGH (ref 8–23)
CO2: 23 mmol/L (ref 22–32)
Calcium: 9.1 mg/dL (ref 8.9–10.3)
Chloride: 108 mmol/L (ref 98–111)
Creatinine, Ser: 1.46 mg/dL — ABNORMAL HIGH (ref 0.61–1.24)
GFR calc Af Amer: 59 mL/min — ABNORMAL LOW (ref >60)
GFR calc non Af Amer: 51 mL/min — ABNORMAL LOW (ref >60)
Glucose, Bld: 138 mg/dL — ABNORMAL HIGH (ref 70–99)
Potassium: 4.2 mmol/L (ref 3.5–5.1)
Sodium: 139 mmol/L (ref 135–145)
TOTAL PROTEIN: 6.4 g/dL — AB (ref 6.5–8.1)
Total Bilirubin: 0.6 mg/dL (ref 0.3–1.2)

## 2018-10-13 LAB — CBC WITH DIFFERENTIAL/PLATELET
Abs Immature Granulocytes: 0.02 10*3/uL (ref 0.00–0.07)
BASOS ABS: 0 10*3/uL (ref 0.0–0.1)
Basophils Relative: 1 %
Eosinophils Absolute: 0.1 10*3/uL (ref 0.0–0.5)
Eosinophils Relative: 2 %
HEMATOCRIT: 27.8 % — AB (ref 39.0–52.0)
Hemoglobin: 9.1 g/dL — ABNORMAL LOW (ref 13.0–17.0)
Immature Granulocytes: 0 %
LYMPHS ABS: 1.1 10*3/uL (ref 0.7–4.0)
Lymphocytes Relative: 24 %
MCH: 31.3 pg (ref 26.0–34.0)
MCHC: 32.7 g/dL (ref 30.0–36.0)
MCV: 95.5 fL (ref 80.0–100.0)
Monocytes Absolute: 0.6 10*3/uL (ref 0.1–1.0)
Monocytes Relative: 13 %
NRBC: 0 % (ref 0.0–0.2)
Neutro Abs: 2.7 10*3/uL (ref 1.7–7.7)
Neutrophils Relative %: 60 %
Platelets: 105 10*3/uL — ABNORMAL LOW (ref 150–400)
RBC: 2.91 MIL/uL — ABNORMAL LOW (ref 4.22–5.81)
RDW: 15.1 % (ref 11.5–15.5)
WBC: 4.5 10*3/uL (ref 4.0–10.5)

## 2018-10-13 IMAGING — CR DG CHEST 2V
1 series · 2 of 2 positions shown · non-contrast
Comparison: PET-CT [DATE].

CLINICAL DATA: Shortness of breath.

EXAM:
CHEST - 2 VIEW

[Series 1: dg chest 2 view · 0.14mm/px · 2 of 2 slices shown]
[im 1/2]
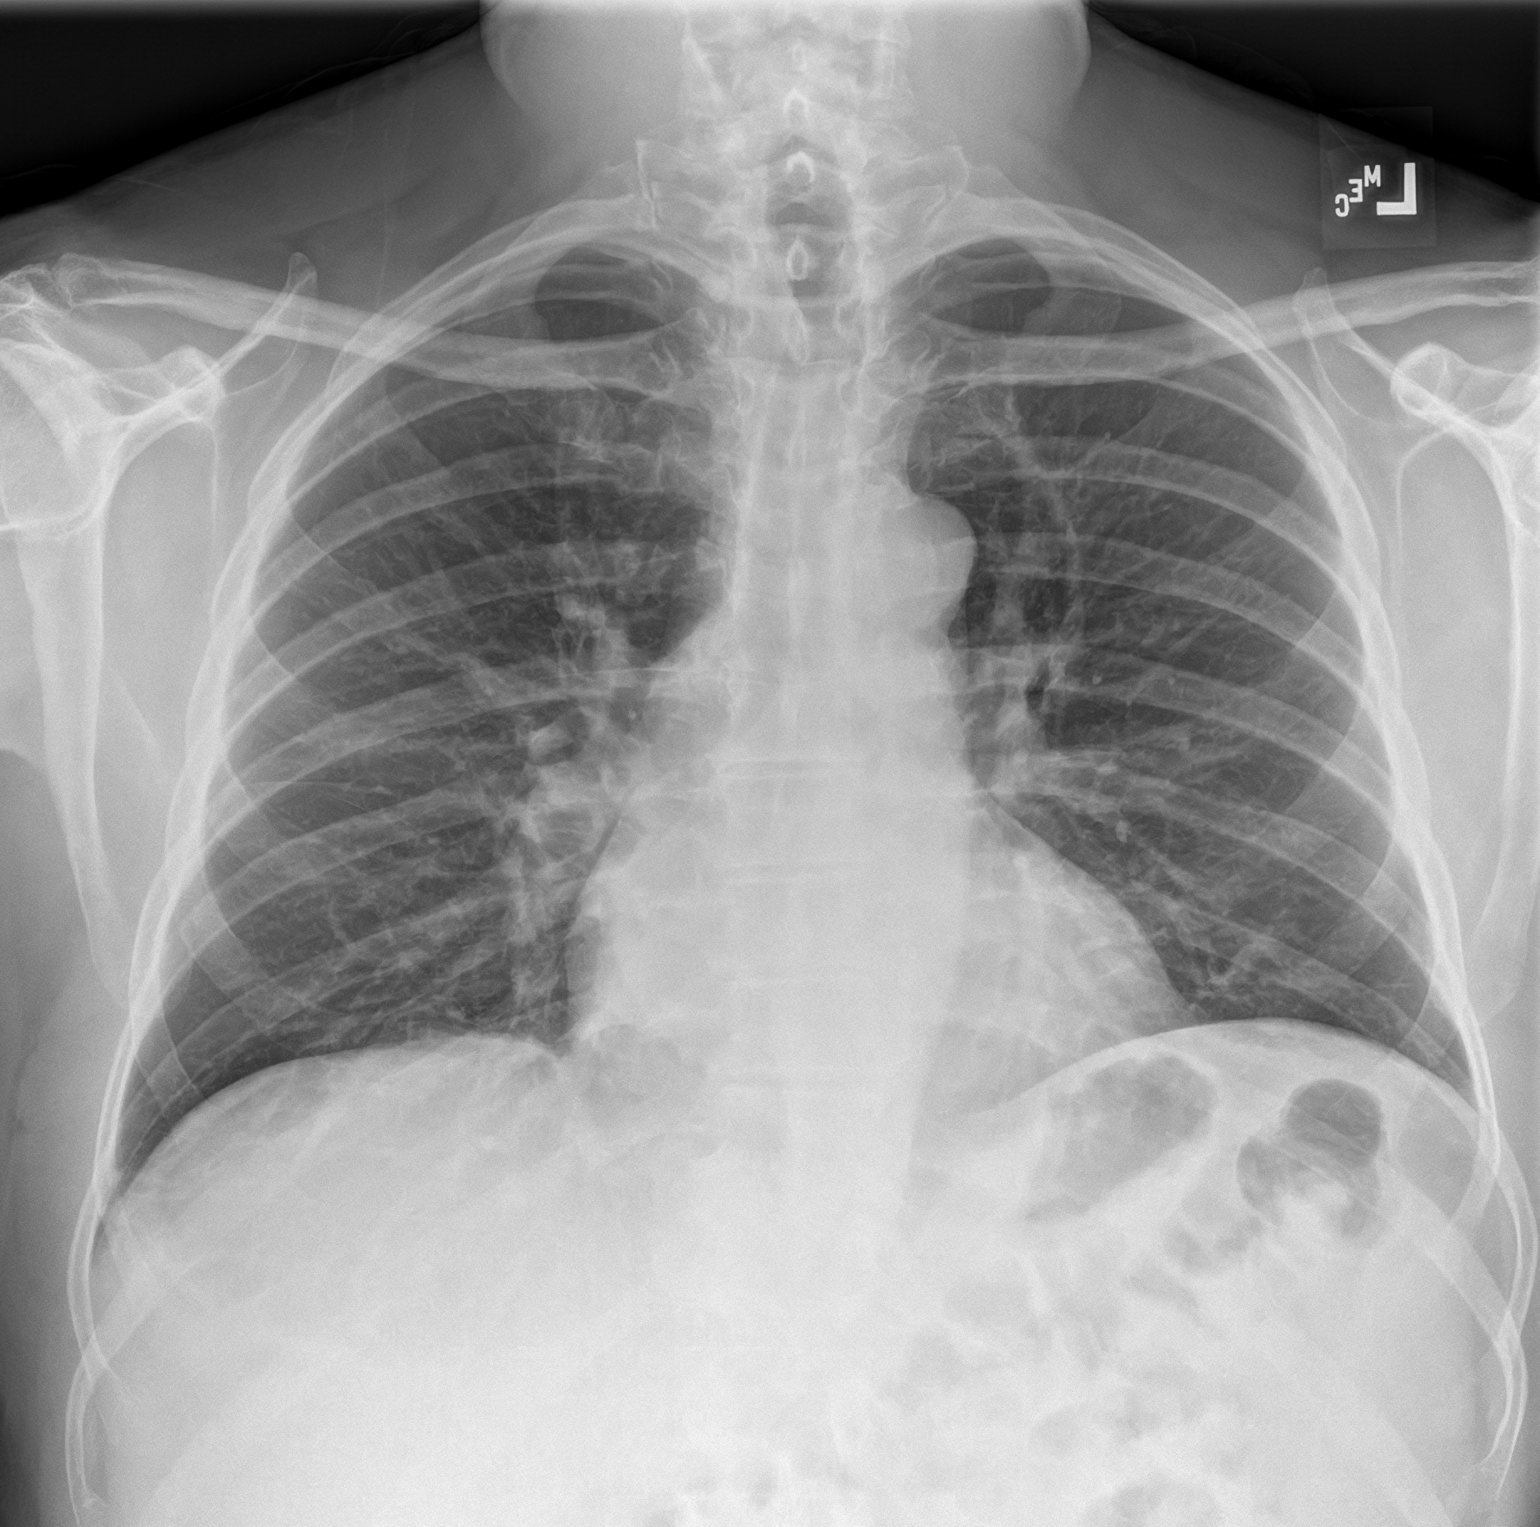
[im 2/2]
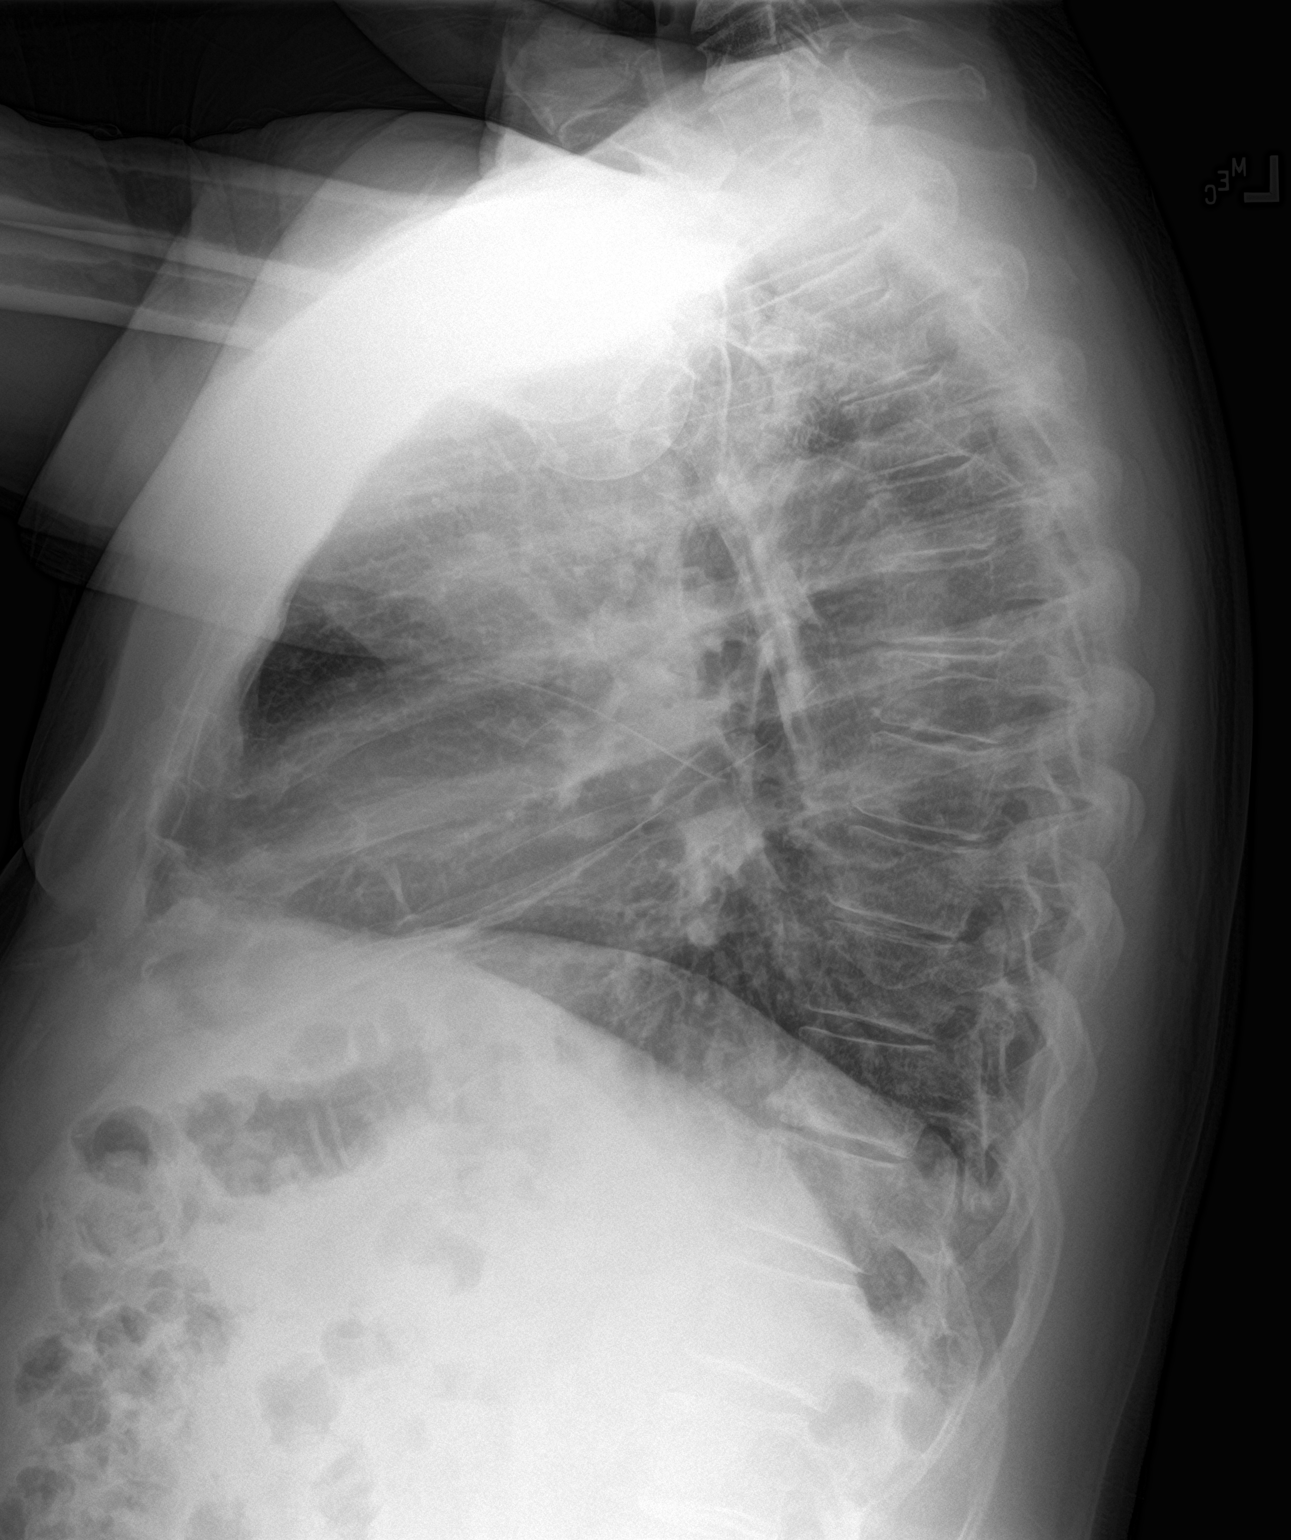

[2 of 2 positions shown; findings below may reference images not displayed]

FINDINGS: Mediastinum hilar structures normal. Lungs are clear. No pleural
effusion or pneumothorax. Heart size normal. No acute bony
abnormality.
IMPRESSION: No acute cardiopulmonary disease.

## 2018-10-13 MED ORDER — BORTEZOMIB CHEMO SQ INJECTION 3.5 MG (2.5MG/ML)
1.3000 mg/m2 | Freq: Once | INTRAMUSCULAR | Status: AC
  Administered 2018-10-13: 2.75 mg via SUBCUTANEOUS
  Filled 2018-10-13: qty 1.1

## 2018-10-13 MED ORDER — PROCHLORPERAZINE MALEATE 10 MG PO TABS
10.0000 mg | ORAL_TABLET | Freq: Once | ORAL | Status: AC
  Administered 2018-10-13: 10 mg via ORAL
  Filled 2018-10-13: qty 1

## 2018-10-13 NOTE — Progress Notes (Signed)
Hematology/Oncology  Follow up note [facility] [phone] [phone]   Patient Care Team: Tonia Ghent, MD as PCP - General (Family Medicine)  REFERRING PROVIDER: Napoleon Form REASON FOR VISIT Follow up for bone marrow results/PET scan and discussion of diagnosis of multiple myeloma.   HISTORY OF PRESENTING ILLNESS:  Luis Burke is a  62 y.o.  male with PMH listed below who was referred to me for evaluation of anemia and thrombocytopenia Associated signs and symptoms: Patient reports feeling fatigue.  Denies SOB with exertion.  Denies weight loss, easy bruising, hematochezia, hemoptysis, hematuria. Context: History of GI bleeding: Denies               History of Chronic kidney disease yes               History of autoimmune disease denies denies               History of hemolytic anemia.                Last colonoscopy: 07/20/2018 upper and lower endoscopy showed esophagitis, normal examined duodenum, 2 polyps removed in the ascending colon, resected and retrieved.  Otherwise normal. He drinks alcohol, usually a few beers during the weekends.  Denies any smoking history.   Intended weight loss 3 pounds for the past 3 months.  # 07/27/2018 multiple myeloma panel showed M protein of 0.1, IgG 662, IgA 49, IgM 7. Patient was called back to further labs done. 08/10/2018, free light chain ratio showed extremely high level of kappa free light chain 10,183, with a kappa lambda light chain ratio of 1414.31 LDH 164 Beta-2 microglobulin 5 Patient was called and discuss about results.  He was recommended to undergo bone marrow biopsy and PET scan. 08/19/2018 bone marrow biopsy showed hypercellular marrow 80%, involved by plasma cell neoplasm up to 95%.  Consistent with plasma cell myeloma.  FISH and cytogenetics are pending.  09/10/2018 skeletal survey showed questionable small lucent lesions within the midshaft of the humerus bilaterally.   Otherwise no suspicious focal lytic lesion or acute bone abnormality. 08/25/2018 PET scan showed no definite hypermetabolic bone disease but CT findings are highly suspicious for numerous small myelomatous lesions involving the spine, sternum and scattered ribs.   INTERVAL HISTORY [name] is a 62 y.o. male who has above history reviewed by me today for a follow-up visit for management of multiple myeloma. 08/28/2018  Patient has started on Velcade and dexamethasone treatment. He also received Revlimid and started Revlimid on 09/06/2018.  Tolerates well.  Denies experiencing any side effects so far, except transient left fingertip numbness which spontaneously resolved.  Today he is accompanied by his significant other.  Patient also brought his medications and have questions about medications. Blood pressure has been running high for the past couple visits.  I have communicated with patient's primary care physician and his Toprol XL has been increased to 100 mg daily.  Norvasc has been increased to 10 mg daily.  Denies any bone pain.    INTERVAL HISTORY [name] CLASS 62 y.o.  male with above history who presents for evaluation prior to cycle 2 chemotherapy treatment for multiple myeloma. Patient is on RVD regimen.  Today is cycle 3. Tolerates well.  Denies any numbness or tingling of fingertips or toes. Reports lower extremity edema up to thigh level bilaterally last week, worst at the end of the day, he elevated both of his legs with improvement.  Today  edema has improved. He has also been eating salty food over the Christmas time.  Shortness of breath with exertion, chronic onset, no chest pain. Mild fatigue, unchanged.  At baseline.  Reports feeling well at baseline.     Review of Systems  Constitutional: Positive for fatigue. Negative for appetite change, chills, fever and unexpected weight change.  HENT:   Negative for hearing loss and voice change.   Eyes: Negative for  eye problems and icterus.  Respiratory: Positive for shortness of breath. Negative for chest tightness and cough.   Cardiovascular: Positive for leg swelling. Negative for chest pain.  Gastrointestinal: Negative for abdominal distention and abdominal pain.  Endocrine: Negative for hot flashes.  Genitourinary: Negative for difficulty urinating, dysuria and frequency.   Musculoskeletal: Negative for arthralgias.  Skin: Negative for itching and rash.  Neurological: Negative for light-headedness and numbness.  Hematological: Negative for adenopathy. Does not bruise/bleed easily.  Psychiatric/Behavioral: Negative for confusion.    MEDICAL HISTORY:  Past Medical History:  Diagnosis Date  . Anemia   . Fatty liver    on u/s 07/2018  . Hyperlipidemia 03/2004  . Hypertension 1994  . Snores     SURGICAL HISTORY: Past Surgical History:  Procedure Laterality Date  . CATARACT EXTRACTION W/PHACO Right 03/04/2018   Procedure: CATARACT EXTRACTION PHACO AND INTRAOCULAR LENS PLACEMENT ([place])  RIGHT TORIC;  Surgeon: Leandrew Koyanagi, MD;  Location: [facility];  Service: Ophthalmology;  Laterality: Right;  [place] no Toric Lens 1:45 5.3  . COLONOSCOPY WITH PROPOFOL N/A 07/20/2018   Procedure: COLONOSCOPY WITH PROPOFOL;  Surgeon: Jonathon Bellows, MD;  Location[facility] ENDOSCOPY;  Service: Gastroenterology;  Laterality: N/A;  . ESOPHAGOGASTRODUODENOSCOPY (EGD) WITH PROPOFOL N/A 07/20/2018   Procedure: ESOPHAGOGASTRODUODENOSCOPY (EGD) WITH PROPOFOL;  Surgeon: Jonathon Bellows, MD;  Location: [facility] ENDOSCOPY;  Service: Gastroenterology;  Laterality: N/A;  . EYE SURGERY Right   . HERNIA REPAIR    . L Lap herniorraphy  04/2000  . Left wrist ganglionectomy      SOCIAL HISTORY: Social History   Socioeconomic History  . Marital status: Single    Spouse name: Not on file  . Number of children: 1  . Years of education: Not on file  . Highest education level: Not on file  Occupational History  .  Occupation: [employer]  Social Needs  . Financial resource strain: Not on file  . Food insecurity:    Worry: Not on file    Inability: Not on file  . Transportation needs:    Medical: Not on file    Non-medical: Not on file  Tobacco Use  . Smoking status: Never Smoker  . Smokeless tobacco: Never Used  . Tobacco comment: Occassionally  Substance and Sexual Activity  . Alcohol use: Not Currently    Alcohol/week: 3.0 standard drinks    Types: 3 Cans of beer per week  . Drug use: No  . Sexual activity: Not on file  Lifestyle  . Physical activity:    Days per week: Not on file    Minutes per session: Not on file  . Stress: Not on file  Relationships  . Social connections:    Talks on phone: Not on file    Gets together: Not on file    Attends religious service: Not on file    Active member of club or organization: Not on file    Attends meetings of clubs or organizations: Not on file    Relationship status: Not on file  . Intimate  partner violence:    Fear of current or ex partner: Not on file    Emotionally abused: Not on file    Physically abused: Not on file    Forced sexual activity: Not on file  Other Topics Concern  . Not on file  Social History Narrative   Divorced Jan 04, 2008, dating as of 01-03-2018   His daughter died 4 days after giving birth to the patient's granddaughter   Has joint custody of his dead daughter's child   Works at [employer] is [employer]    FAMILY HISTORY: Family History  Problem Relation Age of Onset  . Hypertension Mother   . Diabetes Mother   . Heart disease Mother        CAD  . Dementia Mother   . Hypertension Father   . Heart disease Father        MI 02/03  . Colon cancer Father   . Hypertension Sister   . Hypertension Sister   . Hypertension Sister   . Prostate cancer Neg Hx     ALLERGIES:  is allergic to tadalafil.  MEDICATIONS:  Current Outpatient Medications  Medication Sig Dispense Refill  . acetaminophen (TYLENOL) 325  MG tablet Take 650 mg by mouth every 6 (six) hours as needed.    Marland Kitchen acyclovir (ZOVIRAX) 400 MG tablet Take 1 tablet (400 mg total) by mouth 2 (two) times daily. 60 tablet 3  . amLODipine (NORVASC) 5 MG tablet Take 1-2 tablets (5-10 mg total) by mouth daily. (Patient taking differently: Take 10 mg by mouth daily. ) 180 tablet 3  . co-enzyme Q-10 50 MG capsule Take 50 mg by mouth daily.    Marland Kitchen dexamethasone (DECADRON) 4 MG tablet Take 10 tablets (40 mg total) by mouth once a week. 120 tablet 0  . metoprolol succinate (TOPROL-XL) 50 MG 24 hr tablet Take 2-3 tablets (100-150 mg total) by mouth daily. (Patient taking differently: Take 150 mg by mouth daily. ) 270 tablet 3  . oxyCODONE (ROXICODONE) 5 MG immediate release tablet Take 0.5-1 tablets (2.5-5 mg total) by mouth 3 (three) times daily as needed (sedation caution). 15 tablet 0  . REVLIMID 10 MG capsule TAKE 1 CAPSULE BY MOUTH DAILY. TAKE FOR 14 DAYS, THEN HOLD FOR 7 DAYS, REPEAT EVERY 21 DAYS 14 capsule 0  . vitamin B-12 (CYANOCOBALAMIN) 1000 MCG tablet Take 1 tablet (1,000 mcg total) by mouth daily. 30 tablet 3   No current facility-administered medications for this visit.      PHYSICAL EXAMINATION: ECOG PERFORMANCE STATUS: 0 - Asymptomatic Vitals:   10/13/18 0833  BP: (!) 161/79  Pulse: 76  Resp: 16  Temp: (!) 96.2 F (35.7 C)   Filed Weights   10/13/18 0833  Weight: 207 lb 3 oz (94 kg)    Physical Exam Constitutional:      General: He is not in acute distress. HENT:     Head: Normocephalic and atraumatic.  Eyes:     General: No scleral icterus.    Pupils: Pupils are equal, round, and reactive to light.  Neck:     Musculoskeletal: Normal range of motion and neck supple.  Cardiovascular:     Rate and Rhythm: Normal rate and regular rhythm.     Heart sounds: Normal heart sounds.  Pulmonary:     Effort: Pulmonary effort is normal. No respiratory distress.     Breath sounds: No wheezing.  Abdominal:     General: Bowel  sounds are normal. There is no distension.  Palpations: Abdomen is soft. There is no mass.     Tenderness: There is no abdominal tenderness.  Musculoskeletal: Normal range of motion.        General: No deformity.     Comments: Trace edema at ankle level bilaterally.  Skin:    General: Skin is warm and dry.     Findings: No erythema or rash.  Neurological:     Mental Status: He is alert and oriented to person, place, and time.     Cranial Nerves: No cranial nerve deficit.     Coordination: Coordination normal.  Psychiatric:        Behavior: Behavior normal.        Thought Content: Thought content normal.      LABORATORY DATA:  I have reviewed the data as listed Lab Results  Component Value Date   WBC 4.5 10/13/2018   HGB 9.1 (L) 10/13/2018   HCT 27.8 (L) 10/13/2018   MCV 95.5 10/13/2018   PLT 105 (L) 10/13/2018   Recent Labs    09/22/18 0839 10/06/18 0731 10/13/18 0805  NA 140 140 139  K 4.4 4.2 4.2  CL 109 107 108  CO2 23 25 23   GLUCOSE 110* 136* 138*  BUN 25* 23 25*  CREATININE 1.47* 1.34* 1.46*  CALCIUM 9.3 9.1 9.1  GFRNONAA 50* 56* 51*  GFRAA 58* >60 59*  PROT 6.3* 6.5 6.4*  ALBUMIN 4.0 4.1 4.0  AST 17 19 19   ALT 25 23 29   ALKPHOS 68 71 74  BILITOT 0.7 0.7 0.6   Iron/TIBC/Ferritin/ %Sat    Component Value Date/Time   IRON 74 07/01/2018 0944   TIBC 250 07/01/2018 0944   FERRITIN 357 07/01/2018 0944   IRONPCTSAT 30 07/01/2018 0944    Lab Results  Component Value Date   TOTALPROTELP 5.9 (L) 09/22/2018   Lab Results  Component Value Date   KPAFRELGTCHN 1,944.8 (H) 09/22/2018   LAMBDASER 9.3 09/22/2018   KAPLAMBRATIO 209.12 (H) 09/22/2018       ASSESSMENT & PLAN:  1. Multiple myeloma not having achieved remission ([place])   2. SOB (shortness of breath) on exertion   3. 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 Anemia, unspecified type   5. Other fatigue   6. Thrombocytopenia ([place])   7. Encounter for antineoplastic chemotherapy   Beta 2  microglobulin 5 and normal albumin.  Stage II. 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  #Multiple myeloma, on VRD treatment.  Velcade 1.56m/m2 Day 1, 8, 15 Revlimid 1109mdaily Day 1-14. [ plan to increase if GFR improves to >60]. Dexamethasone 4088meekly Status post 2 cycles.  Tolerates well.  Minimal neuropathy..   Multiple myeloma panel  showed partial response. Labs are reviewed and discussed with patient.  Proceed with cycle 3 VRD. [facility] and has appointment and end of January for evaluation of autologous bone marrow transplant.  Continue Asacol over 400 mg twice daily as well as aspirin 81 mg daily.  #Trace lower extremity edema, bilaterally, which was improved after leg elevation No calf pain.  Less likely DVT.  Advised patient to continue watch symptoms.  If it get worse or developed calf pain, will proceed with ultrasound to rule out DVT. The edema most likely due to increased salty food intake during the holiday times.  Low-salt diet discussed with patient.  #Shortness of breath with exertion. Questionable secondary to anemia due to chemotherapy treatments.  Lung fields are clear to auscultation Obtain chest x-ray. Chest x-ray was independent reviewed by me.  Mediastinum hilar structures are normal.  Lungs are clear.  No pleural effusion or pneumothorax.  No acute cardiopulmonary disease.  #We discussed about rationale and potential side effects of bone strengthening agent such as bisphosphonate or Xgeva treatment.  Awaiting dental clearance for starting bisphosphonate or Xgeva. Avoid heavy lifting or contact sports at this point.  # Thrombocytopenia/ Anemia, worsened, due to chemotherapy.  Continue monitor..   Follow up in 1 week for day 8 Velcade.  Repeat CBC CMP and proceed with day 15 Velcade.   Continue taking Revlimid finished 14-day course.  Weekly dexamethasone. Patient  will follow-up with me for cycle 4 of Velcade treatment in 3 weeks.  All questions were answered. The patient knows to call the clinic with any problems questions or concerns. Orders Placed This Encounter  Procedures  . DG Chest 2 View    Standing Status:   Future    Number of Occurrences:   1    Standing Expiration Date:   10/13/2019    Order Specific Question:   Reason for Exam (SYMPTOM  OR DIAGNOSIS REQUIRED)    Answer:   SOB    Order Specific Question:   Preferred imaging location?    Answer:   [facility]    Order Specific Question:   Radiology Contrast Protocol - do NOT remove file path    Answer:   [email or web address]    Return of visit: 3 weeks   Earlie Server, MD, PhD Hematology Oncology [facility] Pager- [phone] 10/13/2018

## 2018-10-13 NOTE — Progress Notes (Signed)
Patient here today for follow up.  Patient c/o SOB and bilateral lower extremity edema

## 2018-10-15 LAB — MULTIPLE MYELOMA PANEL, SERUM
Albumin SerPl Elph-Mcnc: 4.4 g/dL (ref 2.9–4.4)
Albumin/Glob SerPl: 1.8 — ABNORMAL HIGH (ref 0.7–1.7)
Alpha 1: 0.2 g/dL (ref 0.0–0.4)
Alpha2 Glob SerPl Elph-Mcnc: 0.7 g/dL (ref 0.4–1.0)
B-Globulin SerPl Elph-Mcnc: 1.1 g/dL (ref 0.7–1.3)
Gamma Glob SerPl Elph-Mcnc: 0.5 g/dL (ref 0.4–1.8)
Globulin, Total: 2.5 g/dL (ref 2.2–3.9)
IgA: 67 mg/dL (ref 61–437)
IgG (Immunoglobin G), Serum: 527 mg/dL — ABNORMAL LOW (ref 700–1600)
IgM (Immunoglobulin M), Srm: 11 mg/dL — ABNORMAL LOW (ref 20–172)
Total Protein ELP: 6.9 g/dL (ref 6.0–8.5)

## 2018-10-15 LAB — KAPPA/LAMBDA LIGHT CHAINS
Kappa free light chain: 879.1 mg/L — ABNORMAL HIGH (ref 3.3–19.4)
Kappa, lambda light chain ratio: 84.53 — ABNORMAL HIGH (ref 0.26–1.65)
Lambda free light chains: 10.4 mg/L (ref 5.7–26.3)

## 2018-10-19 ENCOUNTER — Telehealth: Payer: Self-pay | Admitting: *Deleted

## 2018-10-19 NOTE — Telephone Encounter (Signed)
Patient notified about Dr. Collie Siad recommendation. He will get labs tomorrow prior to treatment.

## 2018-10-19 NOTE — Telephone Encounter (Signed)
Patient called to report that he has a severe cold. He is scheduled for treatment tomorrow and would like advice regarding medication for his cold.

## 2018-10-19 NOTE — Telephone Encounter (Signed)
Please arrange pt to have lab encounter tmr. Cbc, cmp Advise patient to stay hydrated, rest. No specific treatment for upper respiratory virus infection.

## 2018-10-20 ENCOUNTER — Inpatient Hospital Stay: Payer: BLUE CROSS/BLUE SHIELD

## 2018-10-20 ENCOUNTER — Inpatient Hospital Stay: Payer: BLUE CROSS/BLUE SHIELD | Attending: Oncology

## 2018-10-20 VITALS — BP 163/91 | HR 79 | Temp 97.8°F | Resp 18 | Wt 204.4 lb

## 2018-10-20 DIAGNOSIS — D649 Anemia, unspecified: Secondary | ICD-10-CM | POA: Diagnosis not present

## 2018-10-20 DIAGNOSIS — Z7982 Long term (current) use of aspirin: Secondary | ICD-10-CM | POA: Diagnosis not present

## 2018-10-20 DIAGNOSIS — R600 Localized edema: Secondary | ICD-10-CM | POA: Insufficient documentation

## 2018-10-20 DIAGNOSIS — G629 Polyneuropathy, unspecified: Secondary | ICD-10-CM | POA: Diagnosis not present

## 2018-10-20 DIAGNOSIS — J209 Acute bronchitis, unspecified: Secondary | ICD-10-CM | POA: Diagnosis not present

## 2018-10-20 DIAGNOSIS — N183 Chronic kidney disease, stage 3 (moderate): Secondary | ICD-10-CM | POA: Diagnosis not present

## 2018-10-20 DIAGNOSIS — D6959 Other secondary thrombocytopenia: Secondary | ICD-10-CM | POA: Diagnosis not present

## 2018-10-20 DIAGNOSIS — C9000 Multiple myeloma not having achieved remission: Secondary | ICD-10-CM

## 2018-10-20 DIAGNOSIS — K760 Fatty (change of) liver, not elsewhere classified: Secondary | ICD-10-CM | POA: Insufficient documentation

## 2018-10-20 DIAGNOSIS — R5383 Other fatigue: Secondary | ICD-10-CM | POA: Diagnosis not present

## 2018-10-20 DIAGNOSIS — Z79899 Other long term (current) drug therapy: Secondary | ICD-10-CM | POA: Diagnosis not present

## 2018-10-20 DIAGNOSIS — I129 Hypertensive chronic kidney disease with stage 1 through stage 4 chronic kidney disease, or unspecified chronic kidney disease: Secondary | ICD-10-CM | POA: Insufficient documentation

## 2018-10-20 DIAGNOSIS — E785 Hyperlipidemia, unspecified: Secondary | ICD-10-CM | POA: Insufficient documentation

## 2018-10-20 DIAGNOSIS — Z5111 Encounter for antineoplastic chemotherapy: Secondary | ICD-10-CM | POA: Insufficient documentation

## 2018-10-20 LAB — COMPREHENSIVE METABOLIC PANEL
ALT: 22 U/L (ref 0–44)
AST: 22 U/L (ref 15–41)
Albumin: 4.2 g/dL (ref 3.5–5.0)
Alkaline Phosphatase: 76 U/L (ref 38–126)
Anion gap: 10 (ref 5–15)
BUN: 20 mg/dL (ref 8–23)
CO2: 26 mmol/L (ref 22–32)
Calcium: 9.2 mg/dL (ref 8.9–10.3)
Chloride: 103 mmol/L (ref 98–111)
Creatinine, Ser: 1.55 mg/dL — ABNORMAL HIGH (ref 0.61–1.24)
GFR calc Af Amer: 55 mL/min — ABNORMAL LOW (ref >60)
GFR calc non Af Amer: 47 mL/min — ABNORMAL LOW (ref >60)
Glucose, Bld: 207 mg/dL — ABNORMAL HIGH (ref 70–99)
POTASSIUM: 3.9 mmol/L (ref 3.5–5.1)
Sodium: 139 mmol/L (ref 135–145)
Total Bilirubin: 0.7 mg/dL (ref 0.3–1.2)
Total Protein: 6.9 g/dL (ref 6.5–8.1)

## 2018-10-20 LAB — CBC WITH DIFFERENTIAL/PLATELET
ABS IMMATURE GRANULOCYTES: 0.05 10*3/uL (ref 0.00–0.07)
Basophils Absolute: 0 10*3/uL (ref 0.0–0.1)
Basophils Relative: 0 %
Eosinophils Absolute: 0 10*3/uL (ref 0.0–0.5)
Eosinophils Relative: 0 %
HCT: 29.4 % — ABNORMAL LOW (ref 39.0–52.0)
Hemoglobin: 9.8 g/dL — ABNORMAL LOW (ref 13.0–17.0)
Immature Granulocytes: 1 %
Lymphocytes Relative: 6 %
Lymphs Abs: 0.4 10*3/uL — ABNORMAL LOW (ref 0.7–4.0)
MCH: 31.3 pg (ref 26.0–34.0)
MCHC: 33.3 g/dL (ref 30.0–36.0)
MCV: 93.9 fL (ref 80.0–100.0)
Monocytes Absolute: 0.1 10*3/uL (ref 0.1–1.0)
Monocytes Relative: 1 %
NEUTROS ABS: 6 10*3/uL (ref 1.7–7.7)
Neutrophils Relative %: 92 %
Platelets: 121 10*3/uL — ABNORMAL LOW (ref 150–400)
RBC: 3.13 MIL/uL — ABNORMAL LOW (ref 4.22–5.81)
RDW: 14.9 % (ref 11.5–15.5)
WBC: 6.6 10*3/uL (ref 4.0–10.5)
nRBC: 0 % (ref 0.0–0.2)

## 2018-10-20 MED ORDER — PROCHLORPERAZINE MALEATE 10 MG PO TABS
10.0000 mg | ORAL_TABLET | Freq: Once | ORAL | Status: AC
  Administered 2018-10-20: 10 mg via ORAL
  Filled 2018-10-20: qty 1

## 2018-10-20 MED ORDER — BORTEZOMIB CHEMO SQ INJECTION 3.5 MG (2.5MG/ML)
1.3000 mg/m2 | Freq: Once | INTRAMUSCULAR | Status: AC
  Administered 2018-10-20: 2.75 mg via SUBCUTANEOUS
  Filled 2018-10-20: qty 1.1

## 2018-10-23 ENCOUNTER — Inpatient Hospital Stay (HOSPITAL_BASED_OUTPATIENT_CLINIC_OR_DEPARTMENT_OTHER): Payer: BLUE CROSS/BLUE SHIELD | Admitting: Oncology

## 2018-10-23 ENCOUNTER — Ambulatory Visit
Admission: RE | Admit: 2018-10-23 | Discharge: 2018-10-23 | Disposition: A | Discharge status: Home / Self Care | Payer: BLUE CROSS/BLUE SHIELD | Source: Ambulatory Visit | Attending: Oncology | Admitting: Oncology

## 2018-10-23 ENCOUNTER — Other Ambulatory Visit: Payer: Self-pay | Admitting: Oncology

## 2018-10-23 ENCOUNTER — Other Ambulatory Visit: Payer: Self-pay

## 2018-10-23 ENCOUNTER — Telehealth: Payer: Self-pay | Admitting: *Deleted

## 2018-10-23 ENCOUNTER — Encounter: Payer: Self-pay | Admitting: Oncology

## 2018-10-23 VITALS — BP 172/90 | HR 86 | Temp 97.7°F | Resp 18 | Wt 205.0 lb

## 2018-10-23 DIAGNOSIS — Z5189 Encounter for other specified aftercare: Secondary | ICD-10-CM

## 2018-10-23 DIAGNOSIS — R042 Hemoptysis: Secondary | ICD-10-CM | POA: Insufficient documentation

## 2018-10-23 DIAGNOSIS — G629 Polyneuropathy, unspecified: Secondary | ICD-10-CM

## 2018-10-23 DIAGNOSIS — N183 Chronic kidney disease, stage 3 (moderate): Secondary | ICD-10-CM

## 2018-10-23 DIAGNOSIS — C9000 Multiple myeloma not having achieved remission: Secondary | ICD-10-CM

## 2018-10-23 DIAGNOSIS — R059 Cough, unspecified: Secondary | ICD-10-CM

## 2018-10-23 DIAGNOSIS — I129 Hypertensive chronic kidney disease with stage 1 through stage 4 chronic kidney disease, or unspecified chronic kidney disease: Secondary | ICD-10-CM | POA: Diagnosis not present

## 2018-10-23 DIAGNOSIS — K760 Fatty (change of) liver, not elsewhere classified: Secondary | ICD-10-CM | POA: Diagnosis not present

## 2018-10-23 DIAGNOSIS — R05 Cough: Secondary | ICD-10-CM

## 2018-10-23 DIAGNOSIS — D649 Anemia, unspecified: Secondary | ICD-10-CM | POA: Diagnosis not present

## 2018-10-23 DIAGNOSIS — D6959 Other secondary thrombocytopenia: Secondary | ICD-10-CM | POA: Diagnosis not present

## 2018-10-23 DIAGNOSIS — Z79899 Other long term (current) drug therapy: Secondary | ICD-10-CM

## 2018-10-23 DIAGNOSIS — R600 Localized edema: Secondary | ICD-10-CM | POA: Diagnosis not present

## 2018-10-23 DIAGNOSIS — R5383 Other fatigue: Secondary | ICD-10-CM

## 2018-10-23 DIAGNOSIS — E785 Hyperlipidemia, unspecified: Secondary | ICD-10-CM | POA: Diagnosis not present

## 2018-10-23 DIAGNOSIS — Z7982 Long term (current) use of aspirin: Secondary | ICD-10-CM | POA: Diagnosis not present

## 2018-10-23 DIAGNOSIS — J209 Acute bronchitis, unspecified: Secondary | ICD-10-CM | POA: Diagnosis not present

## 2018-10-23 DIAGNOSIS — Z5111 Encounter for antineoplastic chemotherapy: Secondary | ICD-10-CM | POA: Diagnosis not present

## 2018-10-23 IMAGING — CR DG CHEST 2V
1 series · 2 of 2 positions shown · non-contrast
Comparison: [DATE]

CLINICAL DATA: Hemoptysis, cough.  Cancer patient.

EXAM:
CHEST - 2 VIEW

[Series 1: dg chest 2 view · 0.14mm/px · 2 of 2 slices shown]
[im 1/2]
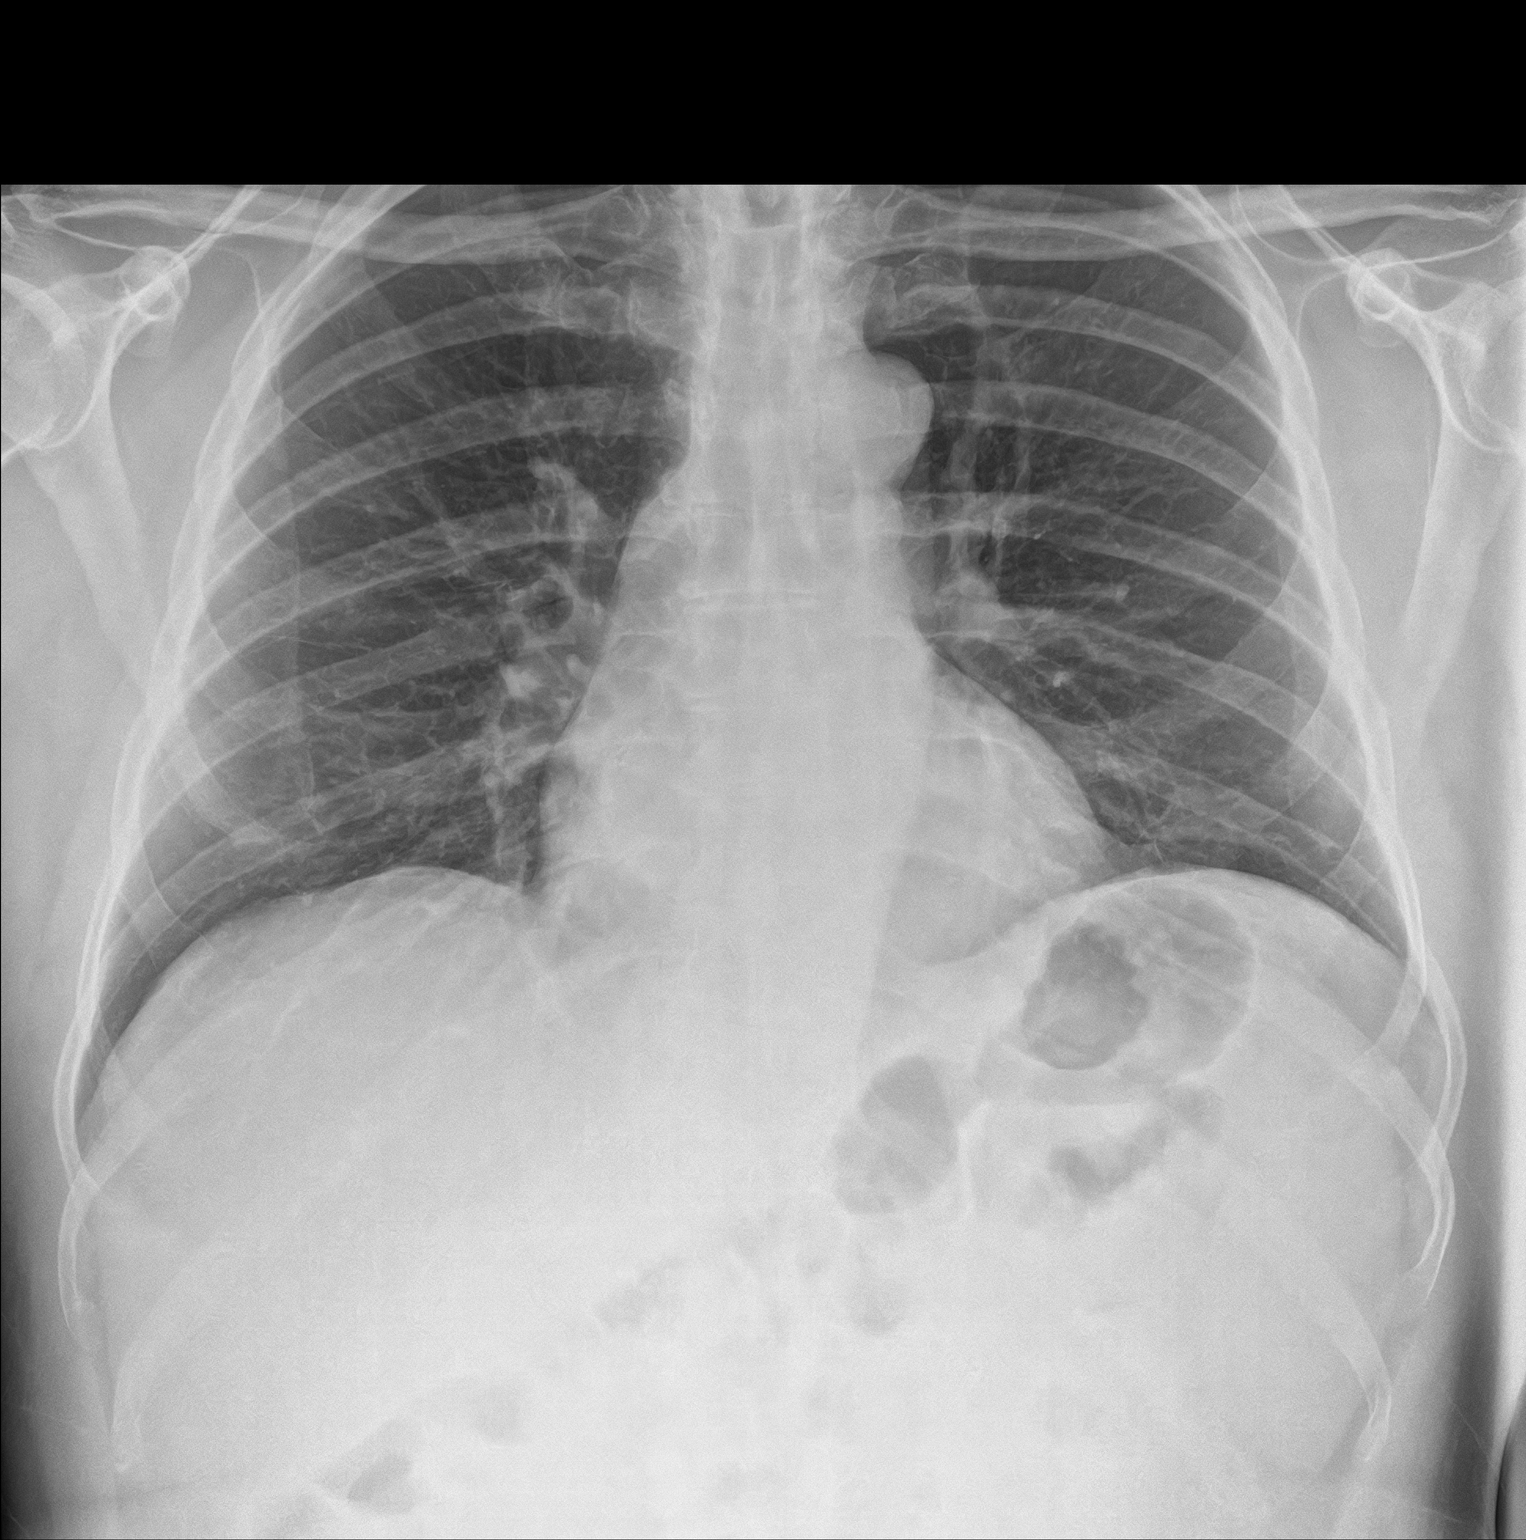
[im 2/2]
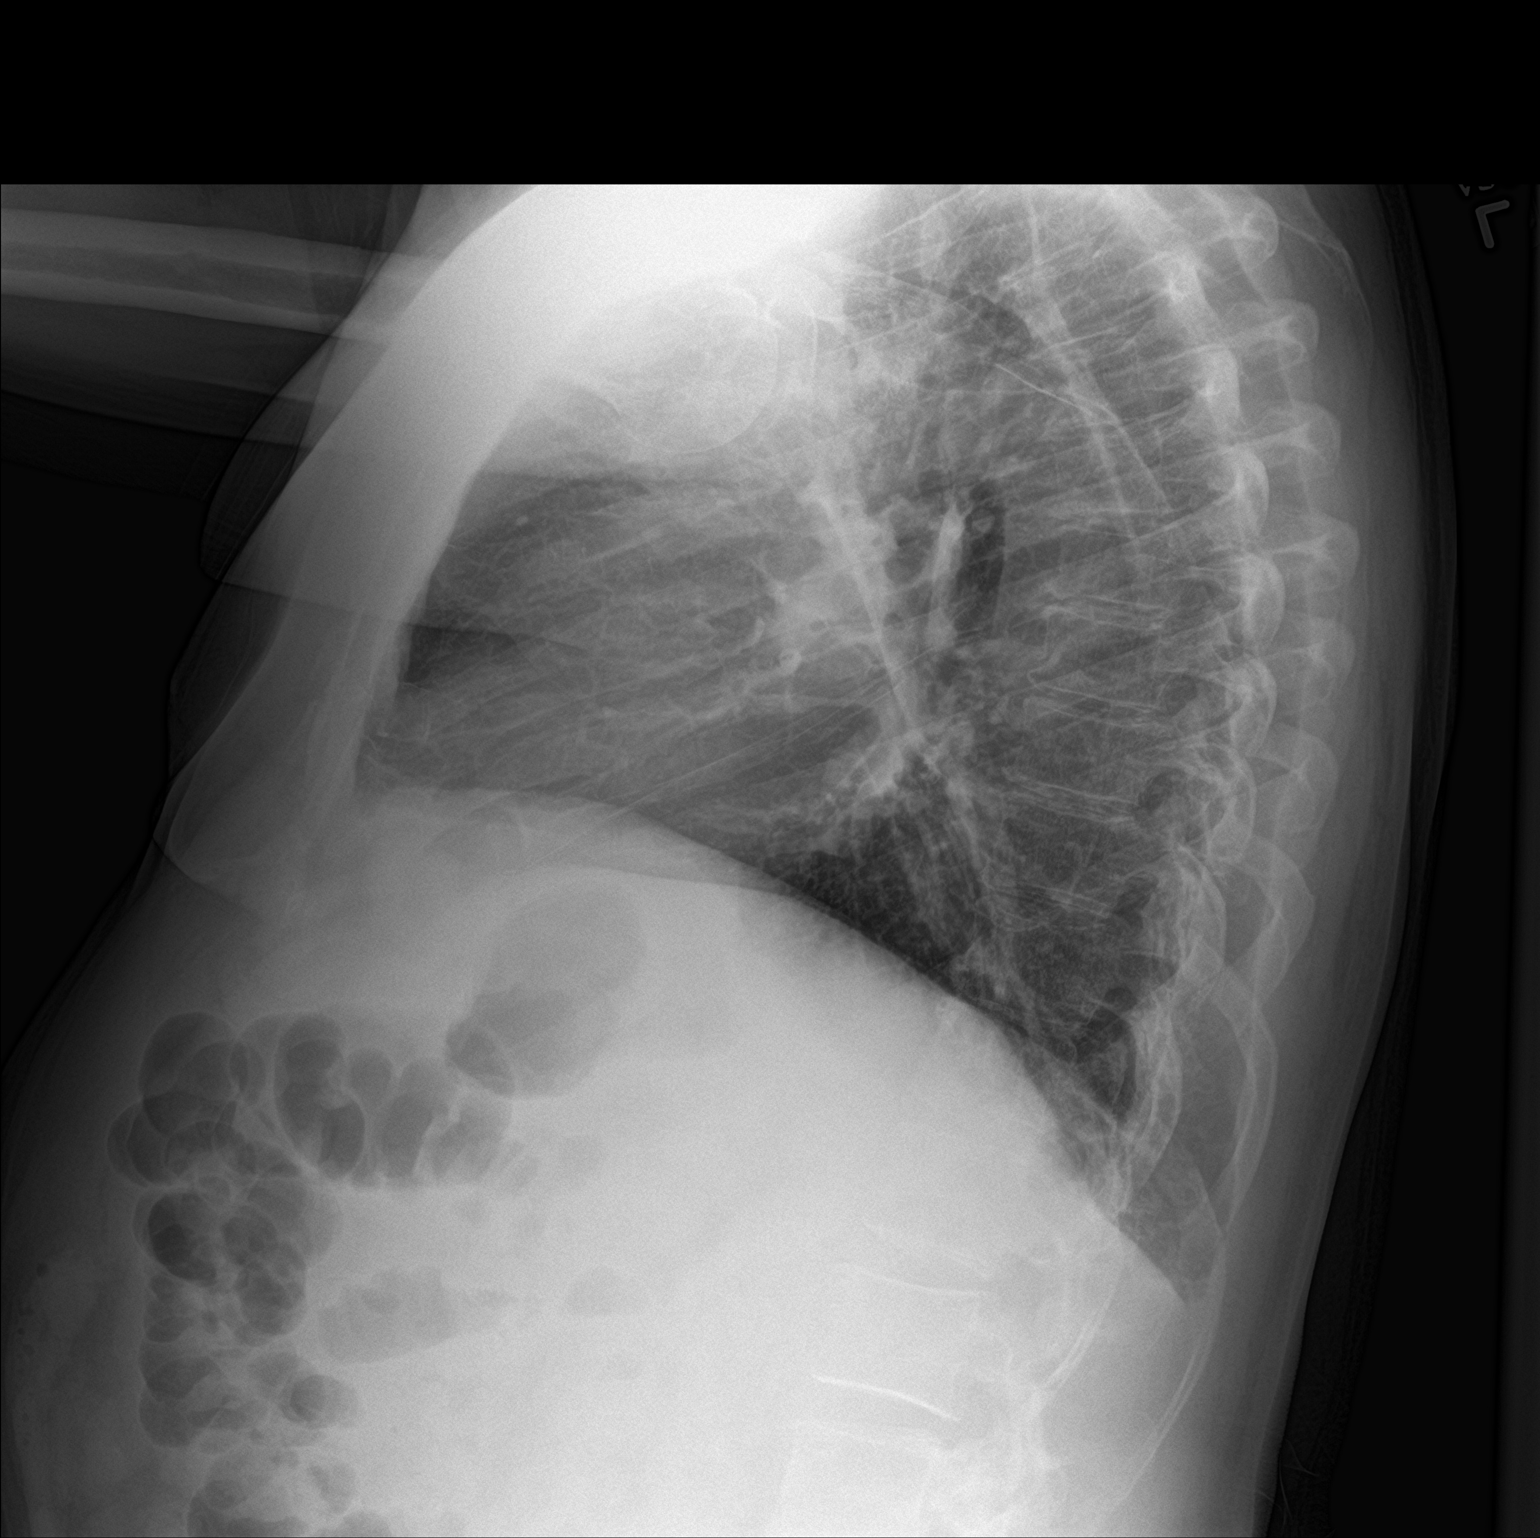

[2 of 2 positions shown; findings below may reference images not displayed]

FINDINGS: The heart size and mediastinal contours are within normal limits.
Both lungs are clear. The visualized skeletal structures are
unremarkable.
IMPRESSION: No active cardiopulmonary disease.

## 2018-10-23 MED ORDER — GUAIFENESIN ER 600 MG PO TB12: 600.0000 mg | ORAL_TABLET | Freq: Two times a day (BID) | ORAL | 0 refills | Status: DC

## 2018-10-23 MED ORDER — AZITHROMYCIN 250 MG PO TABS: ORAL_TABLET | ORAL | 0 refills | Status: DC

## 2018-10-23 MED ORDER — DEXAMETHASONE 4 MG PO TABS: 40.0000 mg | ORAL_TABLET | ORAL | 0 refills | Status: DC

## 2018-10-23 NOTE — Telephone Encounter (Signed)
Patient called reporting that he has a cold and cough which is getting worse. He reports that he does not have a thermometer, but his girlfriend told him he felt fevered last night. He also reports that he coughed up a small amt of blood last night. His voice is in a whisper due to the coughing, he has tried over the counter HBP cold and cough for his symptoms. Appointment given for CXR and physician as soon as he can get here

## 2018-10-23 NOTE — Progress Notes (Signed)
Patient here for complains of coughing up green phlegm, had slight blood last night. Has taken Flu HBP because he "had to have something." complains of mild dizziness.

## 2018-10-23 NOTE — Progress Notes (Signed)
Hematology/Oncology  Follow up note [facility] Telephone:([phone]) [phone] Fax:([phone]) [phone]   Patient Care Team: Tonia Ghent, MD as PCP - General (Family Medicine)  REFERRING PROVIDER: Napoleon Form REASON FOR VISIT Follow up for bone marrow results/PET scan and discussion of diagnosis of multiple myeloma.   HISTORY OF PRESENTING ILLNESS:  Luis Burke is a  63 y.o.  male with PMH listed below who was referred to me for evaluation of anemia and thrombocytopenia Associated signs and symptoms: Patient reports feeling fatigue.  Denies SOB with exertion.  Denies weight loss, easy bruising, hematochezia, hemoptysis, hematuria. Context: History of GI bleeding: Denies               History of Chronic kidney disease yes               History of autoimmune disease denies denies               History of hemolytic anemia.                Last colonoscopy: 07/20/2018 upper and lower endoscopy showed esophagitis, normal examined duodenum, 2 polyps removed in the ascending colon, resected and retrieved.  Otherwise normal. He drinks alcohol, usually a few beers during the weekends.  Denies any smoking history.   Intended weight loss 3 pounds for the past 3 months.  # 07/27/2018 multiple myeloma panel showed M protein of 0.1, IgG 662, IgA 49, IgM 7. Patient was called back to further labs done. 08/10/2018, free light chain ratio showed extremely high level of kappa free light chain 10,183, with a kappa lambda light chain ratio of 1414.31 LDH 164 Beta-2 microglobulin 5 Patient was called and discuss about results.  He was recommended to undergo bone marrow biopsy and PET scan. 08/19/2018 bone marrow biopsy showed hypercellular marrow 80%, involved by plasma cell neoplasm up to 95%.  Consistent with plasma cell myeloma.  FISH and cytogenetics are pending.  09/10/2018 skeletal survey showed questionable small lucent lesions within the midshaft of the humerus bilaterally.   Otherwise no suspicious focal lytic lesion or acute bone abnormality. 08/25/2018 PET scan showed no definite hypermetabolic bone disease but CT findings are highly suspicious for numerous small myelomatous lesions involving the spine, sternum and scattered ribs.   INTERVAL HISTORY Luis Burke is a 63 y.o. male who has above history reviewed by me today for a follow-up visit for management of multiple myeloma. 08/28/2018  Patient has started on Velcade and dexamethasone treatment. He also received Revlimid and started Revlimid on 09/06/2018.  Tolerates well.  Denies experiencing any side effects so far, except transient left fingertip numbness which spontaneously resolved.  Today he is accompanied by his significant other.  Patient also brought his medications and have questions about medications. Blood pressure has been running high for the past couple visits.  I have communicated with patient's primary care physician and his Toprol XL has been increased to 100 mg daily.  Norvasc has been increased to 10 mg daily.  Denies any bone pain.    INTERVAL HISTORY Luis Burke 63 y.o.  male with above history who presents for acute add-on visit for evaluation of persistent cough, congestion, hoarse voice.  Patient develops upper respiratory symptoms including productive cough with greenish sputum, hoarse voice, congestion a few days ago.  CBC and CMP were checked with no   significant new changes.  Patient was advised to stay hydrated rest.  He called today reporting sympto not improving.  Cough is getting worse, especially at night.  Noticed blood-tinged sputum today.  Also reports wheezing at night..  No exacerbating or alleviating factors.  Chronic shortness of breath at baseline.  Denies any chest pain. Patient reports that his colleagues have been sick recently with similar symptoms.   Review of Systems  Constitutional: Positive for fatigue. Negative for appetite change, chills, fever  and unexpected weight change.  HENT:   Negative for hearing loss and voice change.   Eyes: Negative for eye problems and icterus.  Respiratory: Positive for shortness of breath. Negative for chest tightness and cough.   Cardiovascular: Negative for chest pain and leg swelling.  Gastrointestinal: Negative for abdominal distention and abdominal pain.  Endocrine: Negative for hot flashes.  Genitourinary: Negative for difficulty urinating, dysuria and frequency.   Musculoskeletal: Negative for arthralgias.  Skin: Negative for itching and rash.  Neurological: Negative for light-headedness and numbness.  Hematological: Negative for adenopathy. Does not bruise/bleed easily.  Psychiatric/Behavioral: Negative for confusion.    MEDICAL HISTORY:  Past Medical History:  Diagnosis Date  . Anemia   . Fatty liver    on u/s 07/2018  . Hyperlipidemia 03/2004  . Hypertension 1994  . Snores     SURGICAL HISTORY: Past Surgical History:  Procedure Laterality Date  . CATARACT EXTRACTION W/PHACO Right 03/04/2018   Procedure: CATARACT EXTRACTION PHACO AND INTRAOCULAR LENS PLACEMENT ([place])  RIGHT TORIC;  Surgeon: Leandrew Koyanagi, MD;  Location: [place];  Service: Ophthalmology;  Laterality: Right;  Per [place] no Toric Lens 1:45 5.3  . COLONOSCOPY WITH PROPOFOL N/A 07/20/2018   Procedure: COLONOSCOPY WITH PROPOFOL;  Surgeon: Jonathon Bellows, MD;  Location: [facility] ENDOSCOPY;  Service: Gastroenterology;  Laterality: N/A;  . ESOPHAGOGASTRODUODENOSCOPY (EGD) WITH PROPOFOL N/A 07/20/2018   Procedure: ESOPHAGOGASTRODUODENOSCOPY (EGD) WITH PROPOFOL;  Surgeon: Jonathon Bellows, MD;  Location: [facility] ENDOSCOPY;  Service: Gastroenterology;  Laterality: N/A;  . EYE SURGERY Right   . HERNIA REPAIR    . L Lap herniorraphy  04/2000  . Left wrist ganglionectomy      SOCIAL HISTORY: Social History   Socioeconomic History  . Marital status: Single    Spouse name: Not on file  . Number of children: 1  . Years of  education: Not on file  . Highest education level: Not on file  Occupational History  . Occupation: [employer]  Social Needs  . Financial resource strain: Not on file  . Food insecurity:    Worry: Not on file    Inability: Not on file  . Transportation needs:    Medical: Not on file    Non-medical: Not on file  Tobacco Use  . Smoking status: Never Smoker  . Smokeless tobacco: Never Used  . Tobacco comment: Occassionally  Substance and Sexual Activity  . Alcohol use: Not Currently    Alcohol/week: 3.0 standard drinks    Types: 3 Cans of beer per week  . Drug use: No  . Sexual activity: Not on file  Lifestyle  . Physical activity:    Days per week: Not on file    Minutes per session: Not on file  . Stress: Not on file  Relationships  . Social connections:    Talks on phone: Not on file    Gets together: Not on file    Attends religious service: Not on file    Active member of club or organization: Not on file    Attends meetings of clubs or organizations: Not on file  Relationship status: Not on file  . Intimate partner violence:    Fear of current or ex partner: Not on file    Emotionally abused: Not on file    Physically abused: Not on file    Forced sexual activity: Not on file  Other Topics Concern  . Not on file  Social History Narrative   Divorced 12/16/07, dating as of December 15, 2017   His daughter died 4 days after giving birth to the patient's granddaughter   Has joint custody of his dead daughter's child   Works at [employer] is [employer]    FAMILY HISTORY: Family History  Problem Relation Age of Onset  . Hypertension Mother   . Diabetes Mother   . Heart disease Mother        CAD  . Dementia Mother   . Hypertension Father   . Heart disease Father        MI 02/03  . Colon cancer Father   . Hypertension Sister   . Hypertension Sister   . Hypertension Sister   . Prostate cancer Neg Hx     ALLERGIES:  is allergic to tadalafil.  MEDICATIONS:    Current Outpatient Medications  Medication Sig Dispense Refill  . acetaminophen (TYLENOL) 325 MG tablet Take 650 mg by mouth every 6 (six) hours as needed.    Marland Kitchen acyclovir (ZOVIRAX) 400 MG tablet Take 1 tablet (400 mg total) by mouth 2 (two) times daily. 60 tablet 3  . amLODipine (NORVASC) 5 MG tablet Take 1-2 tablets (5-10 mg total) by mouth daily. (Patient taking differently: Take 10 mg by mouth daily. ) 180 tablet 3  . co-enzyme Q-10 50 MG capsule Take 50 mg by mouth daily.    Marland Kitchen dexamethasone (DECADRON) 4 MG tablet Take 10 tablets (40 mg total) by mouth once a week. 120 tablet 0  . DM-APAP-CPM (FLU HBP) 15-500-2 MG TABS Take by mouth as needed.    . metoprolol succinate (TOPROL-XL) 50 MG 24 hr tablet Take 2-3 tablets (100-150 mg total) by mouth daily. (Patient taking differently: Take 150 mg by mouth daily. ) 270 tablet 3  . REVLIMID 10 MG capsule TAKE 1 CAPSULE BY MOUTH DAILY. TAKE FOR 14 DAYS, THEN HOLD FOR 7 DAYS, REPEAT EVERY 21 DAYS 14 capsule 0  . vitamin B-12 (CYANOCOBALAMIN) 1000 MCG tablet Take 1 tablet (1,000 mcg total) by mouth daily. 30 tablet 3  . azithromycin (ZITHROMAX) 250 MG tablet Take 2 tabs on Day 1 followed by 1 tab daily 6 each 0  . oxyCODONE (ROXICODONE) 5 MG immediate release tablet Take 0.5-1 tablets (2.5-5 mg total) by mouth 3 (three) times daily as needed (sedation caution). (Patient not taking: Reported on 10/23/2018) 15 tablet 0   No current facility-administered medications for this visit.      PHYSICAL EXAMINATION: ECOG PERFORMANCE STATUS: 0 - Asymptomatic Vitals:   10/23/18 1144  BP: (!) 172/90  Pulse: 86  Resp: 18  Temp: 97.7 F (36.5 C)   Filed Weights   10/23/18 1144  Weight: 205 lb (93 kg)    Physical Exam Constitutional:      General: He is not in acute distress. HENT:     Head: Normocephalic and atraumatic.  Eyes:     General: No scleral icterus.    Pupils: Pupils are equal, round, and reactive to light.  Neck:     Musculoskeletal:  Normal range of motion and neck supple.  Cardiovascular:     Rate and Rhythm: Normal rate and  regular rhythm.     Heart sounds: Normal heart sounds.  Pulmonary:     Effort: Pulmonary effort is normal. No respiratory distress.     Breath sounds: No wheezing.  Abdominal:     General: Bowel sounds are normal. There is no distension.     Palpations: Abdomen is soft. There is no mass.     Tenderness: There is no abdominal tenderness.  Musculoskeletal: Normal range of motion.        General: No deformity.     Comments: Trace edema at ankle level bilaterally.  Skin:    General: Skin is warm and dry.     Findings: No erythema or rash.  Neurological:     Mental Status: He is alert and oriented to person, place, and time.     Cranial Nerves: No cranial nerve deficit.     Coordination: Coordination normal.  Psychiatric:        Behavior: Behavior normal.        Thought Content: Thought content normal.      LABORATORY DATA:  I have reviewed the data as listed Lab Results  Component Value Date   WBC 6.6 10/20/2018   HGB 9.8 (L) 10/20/2018   HCT 29.4 (L) 10/20/2018   MCV 93.9 10/20/2018   PLT 121 (L) 10/20/2018   Recent Labs    10/06/18 0731 10/13/18 0805 10/20/18 1256  NA 140 139 139  K 4.2 4.2 3.9  CL 107 108 103  CO2 25 23 26   GLUCOSE 136* 138* 207*  BUN 23 25* 20  CREATININE 1.34* 1.46* 1.55*  CALCIUM 9.1 9.1 9.2  GFRNONAA 56* 51* 47*  GFRAA >60 59* 55*  PROT 6.5 6.4* 6.9  ALBUMIN 4.1 4.0 4.2  AST 19 19 22   ALT 23 29 22   ALKPHOS 71 74 76  BILITOT 0.7 0.6 0.7   Iron/TIBC/Ferritin/ %Sat    Component Value Date/Time   IRON 74 07/01/2018 0944   TIBC 250 07/01/2018 0944   FERRITIN 357 07/01/2018 0944   IRONPCTSAT 30 07/01/2018 0944    Lab Results  Component Value Date   TOTALPROTELP 6.9 10/13/2018   Lab Results  Component Value Date   KPAFRELGTCHN 879.1 (H) 10/13/2018   LAMBDASER 10.4 10/13/2018   KAPLAMBRATIO 84.53 (H) 10/13/2018    RADIOGRAPHIC  STUDIES: I have personally reviewed the radiological images as listed and agreed with the findings in the report. 10/23/2018 FINDINGS: The heart size and mediastinal contours are within normal limits. Both lungs are clear. The visualized skeletal structures are unremarkable. IMPRESSION: No active cardiopulmonary disease.   ASSESSMENT & PLAN:  1. Multiple myeloma not having achieved remission ([place])   2. 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 Acute bronchitis, unspecified organism   Light chain multiple myeloma, beta 2 microglobulin 5 and normal albumin.  Stage II. cytogenetics showed normal male chromosome, MDS FISH panel showed trisomy 69- Standard Risk.  #Productive cough, nasal congestion, shortness of breath, not hypoxic in clinic.   Likely URI/acute bronchitis I sent patient course of Z-Pak.  Also advised patient to take dexamethasone 20 mg daily today and tomorrow. He may also use Mucinex 600 mg twice daily. I asked patient to give me an update if symptoms do not improve or get worse.  #Multiple myeloma, currently on VRD treatment.  Velcade 1.14m/m2 Day 1, 8, 15 Revlimid 151mdaily Day 1-14. [ plan to increase if GFR improves to >60]. Dexamethasone 402meekly Is currently in the middle of cycle 3 VRD.  Tolerates well.  Minimal neuropathy.Marland Kitchen   He is going to establish care with [facility] transplant team for discussion of bone marrow transplant. Continue Acyclovir 400 mg twice daily as well as aspirin 81 mg daily.  #We discussed about rationale and potential side effects of bone strengthening agent such as bisphosphonate or Xgeva treatment.  Awaiting dental clearance for starting bisphosphonate or Xgeva. Avoid heavy lifting or contact sports at this point.  # Thrombocytopenia/ Anemia, worsened, due to chemotherapy.  Continue monitor..   RTC: Keep his original appointments. All questions were answered. The patient knows to call the clinic with any problems questions or concerns.  Earlie Server, MD, PhD Hematology Oncology [facility] Pager- [phone] 10/23/2018

## 2018-10-26 ENCOUNTER — Other Ambulatory Visit: Payer: Self-pay | Admitting: Oncology

## 2018-10-26 DIAGNOSIS — C9000 Multiple myeloma not having achieved remission: Secondary | ICD-10-CM

## 2018-10-27 ENCOUNTER — Inpatient Hospital Stay: Payer: BLUE CROSS/BLUE SHIELD

## 2018-10-27 ENCOUNTER — Inpatient Hospital Stay: Payer: BLUE CROSS/BLUE SHIELD | Admitting: Oncology

## 2018-10-27 ENCOUNTER — Encounter (INDEPENDENT_AMBULATORY_CARE_PROVIDER_SITE_OTHER): Payer: Self-pay

## 2018-10-27 VITALS — BP 182/97 | HR 83 | Temp 97.2°F | Resp 18 | Wt 204.0 lb

## 2018-10-27 DIAGNOSIS — J209 Acute bronchitis, unspecified: Secondary | ICD-10-CM | POA: Diagnosis not present

## 2018-10-27 DIAGNOSIS — Z5111 Encounter for antineoplastic chemotherapy: Secondary | ICD-10-CM | POA: Diagnosis not present

## 2018-10-27 DIAGNOSIS — K760 Fatty (change of) liver, not elsewhere classified: Secondary | ICD-10-CM | POA: Diagnosis not present

## 2018-10-27 DIAGNOSIS — E785 Hyperlipidemia, unspecified: Secondary | ICD-10-CM | POA: Diagnosis not present

## 2018-10-27 DIAGNOSIS — C9000 Multiple myeloma not having achieved remission: Secondary | ICD-10-CM

## 2018-10-27 DIAGNOSIS — G629 Polyneuropathy, unspecified: Secondary | ICD-10-CM | POA: Diagnosis not present

## 2018-10-27 DIAGNOSIS — R5383 Other fatigue: Secondary | ICD-10-CM | POA: Diagnosis not present

## 2018-10-27 DIAGNOSIS — D6959 Other secondary thrombocytopenia: Secondary | ICD-10-CM | POA: Diagnosis not present

## 2018-10-27 DIAGNOSIS — N183 Chronic kidney disease, stage 3 (moderate): Secondary | ICD-10-CM | POA: Diagnosis not present

## 2018-10-27 DIAGNOSIS — Z7982 Long term (current) use of aspirin: Secondary | ICD-10-CM | POA: Diagnosis not present

## 2018-10-27 DIAGNOSIS — D649 Anemia, unspecified: Secondary | ICD-10-CM | POA: Diagnosis not present

## 2018-10-27 DIAGNOSIS — R600 Localized edema: Secondary | ICD-10-CM | POA: Diagnosis not present

## 2018-10-27 DIAGNOSIS — Z79899 Other long term (current) drug therapy: Secondary | ICD-10-CM | POA: Diagnosis not present

## 2018-10-27 DIAGNOSIS — I129 Hypertensive chronic kidney disease with stage 1 through stage 4 chronic kidney disease, or unspecified chronic kidney disease: Secondary | ICD-10-CM | POA: Diagnosis not present

## 2018-10-27 LAB — CBC WITH DIFFERENTIAL/PLATELET
Abs Immature Granulocytes: 0.06 10*3/uL (ref 0.00–0.07)
Basophils Absolute: 0 10*3/uL (ref 0.0–0.1)
Basophils Relative: 0 %
Eosinophils Absolute: 0 10*3/uL (ref 0.0–0.5)
Eosinophils Relative: 1 %
HCT: 29.4 % — ABNORMAL LOW (ref 39.0–52.0)
Hemoglobin: 9.7 g/dL — ABNORMAL LOW (ref 13.0–17.0)
Immature Granulocytes: 1 %
Lymphocytes Relative: 9 %
Lymphs Abs: 0.6 10*3/uL — ABNORMAL LOW (ref 0.7–4.0)
MCH: 30.7 pg (ref 26.0–34.0)
MCHC: 33 g/dL (ref 30.0–36.0)
MCV: 93 fL (ref 80.0–100.0)
MONO ABS: 0.2 10*3/uL (ref 0.1–1.0)
MONOS PCT: 4 %
Neutro Abs: 5.5 10*3/uL (ref 1.7–7.7)
Neutrophils Relative %: 85 %
PLATELETS: 192 10*3/uL (ref 150–400)
RBC: 3.16 MIL/uL — ABNORMAL LOW (ref 4.22–5.81)
RDW: 14.8 % (ref 11.5–15.5)
WBC: 6.4 10*3/uL (ref 4.0–10.5)
nRBC: 0 % (ref 0.0–0.2)

## 2018-10-27 LAB — COMPREHENSIVE METABOLIC PANEL
ALT: 28 U/L (ref 0–44)
AST: 18 U/L (ref 15–41)
Albumin: 3.9 g/dL (ref 3.5–5.0)
Alkaline Phosphatase: 67 U/L (ref 38–126)
Anion gap: 9 (ref 5–15)
BUN: 24 mg/dL — ABNORMAL HIGH (ref 8–23)
CO2: 25 mmol/L (ref 22–32)
Calcium: 9 mg/dL (ref 8.9–10.3)
Chloride: 104 mmol/L (ref 98–111)
Creatinine, Ser: 1.35 mg/dL — ABNORMAL HIGH (ref 0.61–1.24)
GFR calc Af Amer: >60 mL/min (ref >60)
GFR calc non Af Amer: 56 mL/min — ABNORMAL LOW (ref >60)
GLUCOSE: 144 mg/dL — AB (ref 70–99)
Potassium: 3.9 mmol/L (ref 3.5–5.1)
SODIUM: 138 mmol/L (ref 135–145)
Total Bilirubin: 0.7 mg/dL (ref 0.3–1.2)
Total Protein: 6.5 g/dL (ref 6.5–8.1)

## 2018-10-27 MED ORDER — PROCHLORPERAZINE MALEATE 10 MG PO TABS
10.0000 mg | ORAL_TABLET | Freq: Once | ORAL | Status: AC
  Administered 2018-10-27: 10 mg via ORAL
  Filled 2018-10-27: qty 1

## 2018-10-27 MED ORDER — BORTEZOMIB CHEMO SQ INJECTION 3.5 MG (2.5MG/ML)
1.3000 mg/m2 | Freq: Once | INTRAMUSCULAR | Status: AC
  Administered 2018-10-27: 2.75 mg via SUBCUTANEOUS
  Filled 2018-10-27: qty 1.1

## 2018-11-03 ENCOUNTER — Encounter: Payer: Self-pay | Admitting: Oncology

## 2018-11-03 ENCOUNTER — Inpatient Hospital Stay: Payer: BLUE CROSS/BLUE SHIELD

## 2018-11-03 ENCOUNTER — Inpatient Hospital Stay (HOSPITAL_BASED_OUTPATIENT_CLINIC_OR_DEPARTMENT_OTHER): Payer: BLUE CROSS/BLUE SHIELD | Admitting: Oncology

## 2018-11-03 ENCOUNTER — Other Ambulatory Visit: Payer: Self-pay

## 2018-11-03 VITALS — BP 151/104 | HR 80 | Temp 96.9°F | Resp 18 | Wt 206.3 lb

## 2018-11-03 VITALS — BP 168/96 | HR 76 | Temp 97.6°F | Resp 18

## 2018-11-03 DIAGNOSIS — Z7982 Long term (current) use of aspirin: Secondary | ICD-10-CM

## 2018-11-03 DIAGNOSIS — D696 Thrombocytopenia, unspecified: Secondary | ICD-10-CM | POA: Diagnosis not present

## 2018-11-03 DIAGNOSIS — C9000 Multiple myeloma not having achieved remission: Secondary | ICD-10-CM

## 2018-11-03 DIAGNOSIS — Z5111 Encounter for antineoplastic chemotherapy: Secondary | ICD-10-CM | POA: Diagnosis not present

## 2018-11-03 DIAGNOSIS — E785 Hyperlipidemia, unspecified: Secondary | ICD-10-CM

## 2018-11-03 DIAGNOSIS — G629 Polyneuropathy, unspecified: Secondary | ICD-10-CM | POA: Diagnosis not present

## 2018-11-03 DIAGNOSIS — I129 Hypertensive chronic kidney disease with stage 1 through stage 4 chronic kidney disease, or unspecified chronic kidney disease: Secondary | ICD-10-CM | POA: Diagnosis not present

## 2018-11-03 DIAGNOSIS — R5383 Other fatigue: Secondary | ICD-10-CM | POA: Diagnosis not present

## 2018-11-03 DIAGNOSIS — Z79899 Other long term (current) drug therapy: Secondary | ICD-10-CM | POA: Diagnosis not present

## 2018-11-03 DIAGNOSIS — K760 Fatty (change of) liver, not elsewhere classified: Secondary | ICD-10-CM | POA: Diagnosis not present

## 2018-11-03 DIAGNOSIS — N183 Chronic kidney disease, stage 3 (moderate): Secondary | ICD-10-CM

## 2018-11-03 DIAGNOSIS — D649 Anemia, unspecified: Secondary | ICD-10-CM

## 2018-11-03 DIAGNOSIS — D6959 Other secondary thrombocytopenia: Secondary | ICD-10-CM | POA: Diagnosis not present

## 2018-11-03 DIAGNOSIS — J209 Acute bronchitis, unspecified: Secondary | ICD-10-CM | POA: Diagnosis not present

## 2018-11-03 DIAGNOSIS — R600 Localized edema: Secondary | ICD-10-CM | POA: Diagnosis not present

## 2018-11-03 LAB — COMPREHENSIVE METABOLIC PANEL
ALT: 23 U/L (ref 0–44)
AST: 19 U/L (ref 15–41)
Albumin: 3.9 g/dL (ref 3.5–5.0)
Alkaline Phosphatase: 86 U/L (ref 38–126)
Anion gap: 7 (ref 5–15)
BILIRUBIN TOTAL: 0.5 mg/dL (ref 0.3–1.2)
BUN: 24 mg/dL — ABNORMAL HIGH (ref 8–23)
CO2: 24 mmol/L (ref 22–32)
Calcium: 8.9 mg/dL (ref 8.9–10.3)
Chloride: 108 mmol/L (ref 98–111)
Creatinine, Ser: 1.42 mg/dL — ABNORMAL HIGH (ref 0.61–1.24)
GFR calc Af Amer: >60 mL/min (ref >60)
GFR calc non Af Amer: 53 mL/min — ABNORMAL LOW (ref >60)
Glucose, Bld: 151 mg/dL — ABNORMAL HIGH (ref 70–99)
Potassium: 4.3 mmol/L (ref 3.5–5.1)
Sodium: 139 mmol/L (ref 135–145)
Total Protein: 6.5 g/dL (ref 6.5–8.1)

## 2018-11-03 LAB — CBC WITH DIFFERENTIAL/PLATELET
Abs Immature Granulocytes: 0.02 10*3/uL (ref 0.00–0.07)
BASOS ABS: 0 10*3/uL (ref 0.0–0.1)
Basophils Relative: 1 %
Eosinophils Absolute: 0.1 10*3/uL (ref 0.0–0.5)
Eosinophils Relative: 1 %
HCT: 29.8 % — ABNORMAL LOW (ref 39.0–52.0)
Hemoglobin: 9.5 g/dL — ABNORMAL LOW (ref 13.0–17.0)
Immature Granulocytes: 0 %
Lymphocytes Relative: 15 %
Lymphs Abs: 0.7 10*3/uL (ref 0.7–4.0)
MCH: 30.9 pg (ref 26.0–34.0)
MCHC: 31.9 g/dL (ref 30.0–36.0)
MCV: 97.1 fL (ref 80.0–100.0)
Monocytes Absolute: 0.3 10*3/uL (ref 0.1–1.0)
Monocytes Relative: 6 %
Neutro Abs: 3.8 10*3/uL (ref 1.7–7.7)
Neutrophils Relative %: 77 %
Platelets: 136 10*3/uL — ABNORMAL LOW (ref 150–400)
RBC: 3.07 MIL/uL — ABNORMAL LOW (ref 4.22–5.81)
RDW: 15.5 % (ref 11.5–15.5)
WBC: 4.9 10*3/uL (ref 4.0–10.5)
nRBC: 0 % (ref 0.0–0.2)

## 2018-11-03 MED ORDER — PROCHLORPERAZINE MALEATE 10 MG PO TABS
10.0000 mg | ORAL_TABLET | Freq: Once | ORAL | Status: AC
  Administered 2018-11-03: 10 mg via ORAL
  Filled 2018-11-03: qty 1

## 2018-11-03 MED ORDER — BORTEZOMIB CHEMO SQ INJECTION 3.5 MG (2.5MG/ML)
1.3000 mg/m2 | Freq: Once | INTRAMUSCULAR | Status: AC
  Administered 2018-11-03: 2.75 mg via SUBCUTANEOUS
  Filled 2018-11-03: qty 1.1

## 2018-11-03 MED ORDER — DEXAMETHASONE 4 MG PO TABS: 40.0000 mg | ORAL_TABLET | ORAL | 0 refills | Status: DC

## 2018-11-03 NOTE — Progress Notes (Signed)
Hematology/Oncology  Follow up note [facility] [phone] [phone]   Patient Care Team: Tonia Ghent, MD as PCP - General (Family Medicine)  REFERRING PROVIDER: Napoleon Form REASON FOR VISIT Follow up for bone marrow results/PET scan and discussion of diagnosis of multiple myeloma.   HISTORY OF PRESENTING ILLNESS:  Luis Burke is a  63 y.o.  male with PMH listed below who was referred to me for evaluation of anemia and thrombocytopenia Associated signs and symptoms: Patient reports feeling fatigue.  Denies SOB with exertion.  Denies weight loss, easy bruising, hematochezia, hemoptysis, hematuria. Context: History of GI bleeding: Denies               History of Chronic kidney disease yes               History of autoimmune disease denies denies               History of hemolytic anemia.                Last colonoscopy: 07/20/2018 upper and lower endoscopy showed esophagitis, normal examined duodenum, 2 polyps removed in the ascending colon, resected and retrieved.  Otherwise normal. He drinks alcohol, usually a few beers during the weekends.  Denies any smoking history.   Intended weight loss 3 pounds for the past 3 months.  # 07/27/2018 multiple myeloma panel showed M protein of 0.1, IgG 662, IgA 49, IgM 7. Patient was called back to further labs done. 08/10/2018, free light chain ratio showed extremely high level of kappa free light chain 10,183, with a kappa lambda light chain ratio of 1414.31 LDH 164 Beta-2 microglobulin 5 Patient was called and discuss about results.  He was recommended to undergo bone marrow biopsy and PET scan. 08/19/2018 bone marrow biopsy showed hypercellular marrow 80%, involved by plasma cell neoplasm up to 95%.  Consistent with plasma cell myeloma.  FISH and cytogenetics are pending.  09/10/2018 skeletal survey showed questionable small lucent lesions within the midshaft of the humerus bilaterally.   Otherwise no suspicious focal lytic lesion or acute bone abnormality. 08/25/2018 PET scan showed no definite hypermetabolic bone disease but CT findings are highly suspicious for numerous small myelomatous lesions involving the spine, sternum and scattered ribs.   INTERVAL HISTORY Luis Burke is a 63 y.o. male who has above history reviewed by me today for a follow-up visit for management of multiple myeloma. 08/28/2018  Patient has started on Velcade and dexamethasone treatment. He also received Revlimid and started Revlimid on 09/06/2018.  Tolerates well.  Denies experiencing any side effects so far, except transient left fingertip numbness which spontaneously resolved.  Today he is accompanied by his significant other.  Patient also brought his medications and have questions about medications. Blood pressure has been running high for the past couple visits.  I have communicated with patient's primary care physician and his Toprol XL has been increased to 100 mg daily.  Norvasc has been increased to 10 mg daily.  Denies any bone pain.    INTERVAL HISTORY Luis Burke 63 y.o.  male with above history who presents for evaluation prior to cycle 4 RVD for treatment of multiple myeloma.  #Patient was last seen in the office on 10/23/2018 for acute upper respiratory infection, bronchitis. Was treated with a course of azithromycin. Reports cough has resolved.  Hoarseness of voice resolved.  No fever or chills.  Feeling better.  #Reports right eyelid puffiness and feels funny.  Also he feels still have chronic bilateral lower extremity edema at the ankle.    Review of Systems  Constitutional: Positive for fatigue. Negative for appetite change, chills, fever and unexpected weight change.  HENT:   Negative for hearing loss and voice change.   Eyes: Negative for eye problems and icterus.  Respiratory: Negative for chest tightness, cough and shortness of breath.   Cardiovascular:  Negative for chest pain and leg swelling.  Gastrointestinal: Negative for abdominal distention and abdominal pain.  Endocrine: Negative for hot flashes.  Genitourinary: Negative for difficulty urinating, dysuria and frequency.   Musculoskeletal: Negative for arthralgias.  Skin: Negative for itching and rash.  Neurological: Negative for light-headedness and numbness.  Hematological: Negative for adenopathy. Does not bruise/bleed easily.  Psychiatric/Behavioral: Negative for confusion.    MEDICAL HISTORY:  Past Medical History:  Diagnosis Date  . Anemia   . Fatty liver    on u/s 07/2018  . Hyperlipidemia 03/2004  . Hypertension 1994  . Snores     SURGICAL HISTORY: Past Surgical History:  Procedure Laterality Date  . CATARACT EXTRACTION W/PHACO Right 03/04/2018   Procedure: CATARACT EXTRACTION PHACO AND INTRAOCULAR LENS PLACEMENT ([place])  RIGHT TORIC;  Surgeon: Leandrew Koyanagi, MD;  Location: [facility];  Service: Ophthalmology;  Laterality: Right;  Per [place] no Toric Lens 1:45 5.3  . COLONOSCOPY WITH PROPOFOL N/A 07/20/2018   Procedure: COLONOSCOPY WITH PROPOFOL;  Surgeon: Jonathon Bellows, MD;  Location: [facility] ENDOSCOPY;  Service: Gastroenterology;  Laterality: N/A;  . ESOPHAGOGASTRODUODENOSCOPY (EGD) WITH PROPOFOL N/A 07/20/2018   Procedure: ESOPHAGOGASTRODUODENOSCOPY (EGD) WITH PROPOFOL;  Surgeon: Jonathon Bellows, MD;  Location: [facility] ENDOSCOPY;  Service: Gastroenterology;  Laterality: N/A;  . EYE SURGERY Right   . HERNIA REPAIR    . L Lap herniorraphy  04/2000  . Left wrist ganglionectomy      SOCIAL HISTORY: Social History   Socioeconomic History  . Marital status: Single    Spouse name: Not on file  . Number of children: 1  . Years of education: Not on file  . Highest education level: Not on file  Occupational History  . Occupation: [employer]  Social Needs  . Financial resource strain: Not on file  . Food insecurity:    Worry: Not on file    Inability: Not on  file  . Transportation needs:    Medical: Not on file    Non-medical: Not on file  Tobacco Use  . Smoking status: Never Smoker  . Smokeless tobacco: Never Used  . Tobacco comment: Occassionally  Substance and Sexual Activity  . Alcohol use: Not Currently    Alcohol/week: 3.0 standard drinks    Types: 3 Cans of beer per week  . Drug use: No  . Sexual activity: Not on file  Lifestyle  . Physical activity:    Days per week: Not on file    Minutes per session: Not on file  . Stress: Not on file  Relationships  . Social connections:    Talks on phone: Not on file    Gets together: Not on file    Attends religious service: Not on file    Active member of club or organization: Not on file    Attends meetings of clubs or organizations: Not on file    Relationship status: Not on file  . Intimate partner violence:    Fear of current or ex partner: Not on file    Emotionally abused: Not on file    Physically abused: Not  on file    Forced sexual activity: Not on file  Other Topics Concern  . Not on file  Social History Narrative   Divorced 12-28-2007, dating as of 2017-12-27   His daughter died 4 days after giving birth to the patient's granddaughter   Has joint custody of his dead daughter's child   Works at [employer] is [employer]    FAMILY HISTORY: Family History  Problem Relation Age of Onset  . Hypertension Mother   . Diabetes Mother   . Heart disease Mother        CAD  . Dementia Mother   . Hypertension Father   . Heart disease Father        MI 02/03  . Colon cancer Father   . Hypertension Sister   . Hypertension Sister   . Hypertension Sister   . Prostate cancer Neg Hx     ALLERGIES:  is allergic to tadalafil.  MEDICATIONS:  Current Outpatient Medications  Medication Sig Dispense Refill  . acetaminophen (TYLENOL) 325 MG tablet Take 650 mg by mouth every 6 (six) hours as needed.    Marland Kitchen acyclovir (ZOVIRAX) 400 MG tablet Take 1 tablet (400 mg total) by mouth 2 (two)  times daily. 60 tablet 3  . amLODipine (NORVASC) 5 MG tablet Take 1-2 tablets (5-10 mg total) by mouth daily. (Patient taking differently: Take 10 mg by mouth daily. ) 180 tablet 3  . co-enzyme Q-10 50 MG capsule Take 50 mg by mouth daily.    Marland Kitchen dexamethasone (DECADRON) 4 MG tablet Take 10 tablets (40 mg total) by mouth once a week. 30 tablet 0  . metoprolol succinate (TOPROL-XL) 50 MG 24 hr tablet Take 2-3 tablets (100-150 mg total) by mouth daily. (Patient taking differently: Take 150 mg by mouth daily. ) 270 tablet 3  . REVLIMID 10 MG capsule TAKE 1 CAPSULE BY MOUTH DAILY FOR 14 DAYS, THEN HOLD FOR 7 DAYS. REPEAT EVERY 21 DAYS. 14 capsule 0  . vitamin B-12 (CYANOCOBALAMIN) 1000 MCG tablet Take 1 tablet (1,000 mcg total) by mouth daily. 30 tablet 3  . DM-APAP-CPM (FLU HBP) 15-500-2 MG TABS Take by mouth as needed.    Marland Kitchen guaiFENesin (MUCINEX) 600 MG 12 hr tablet Take 1 tablet (600 mg total) by mouth 2 (two) times daily. (Patient not taking: Reported on 11/03/2018) 30 tablet 0  . oxyCODONE (ROXICODONE) 5 MG immediate release tablet Take 0.5-1 tablets (2.5-5 mg total) by mouth 3 (three) times daily as needed (sedation caution). (Patient not taking: Reported on 10/23/2018) 15 tablet 0   No current facility-administered medications for this visit.      PHYSICAL EXAMINATION: ECOG PERFORMANCE STATUS: 1 - Symptomatic but completely ambulatory Vitals:   11/03/18 0923  BP: (!) 151/104  Pulse: 80  Resp: 18  Temp: (!) 96.9 F (36.1 C)   Filed Weights   11/03/18 0923  Weight: 206 lb 4.8 oz (93.6 kg)    Physical Exam Constitutional:      General: He is not in acute distress. HENT:     Head: Normocephalic and atraumatic.  Eyes:     General: No scleral icterus.    Pupils: Pupils are equal, round, and reactive to light.  Neck:     Musculoskeletal: Normal range of motion and neck supple.  Cardiovascular:     Rate and Rhythm: Normal rate and regular rhythm.     Heart sounds: Normal heart sounds.   Pulmonary:     Effort: Pulmonary effort is normal. No  respiratory distress.     Breath sounds: No wheezing.  Abdominal:     General: Bowel sounds are normal. There is no distension.     Palpations: Abdomen is soft. There is no mass.     Tenderness: There is no abdominal tenderness.  Musculoskeletal: Normal range of motion.        General: No deformity.     Comments: Trace edema at ankle level bilaterally.  Skin:    General: Skin is warm and dry.     Findings: No erythema or rash.  Neurological:     Mental Status: He is alert and oriented to person, place, and time.     Cranial Nerves: No cranial nerve deficit.     Coordination: Coordination normal.  Psychiatric:        Behavior: Behavior normal.        Thought Content: Thought content normal.      LABORATORY DATA:  I have reviewed the data as listed Lab Results  Component Value Date   WBC 4.9 11/03/2018   HGB 9.5 (L) 11/03/2018   HCT 29.8 (L) 11/03/2018   MCV 97.1 11/03/2018   PLT 136 (L) 11/03/2018   Recent Labs    10/20/18 1256 10/27/18 1328 11/03/18 0850  NA 139 138 139  K 3.9 3.9 4.3  CL 103 104 108  CO2 26 25 24   GLUCOSE 207* 144* 151*  BUN 20 24* 24*  CREATININE 1.55* 1.35* 1.42*  CALCIUM 9.2 9.0 8.9  GFRNONAA 47* 56* 53*  GFRAA 55* >60 >60  PROT 6.9 6.5 6.5  ALBUMIN 4.2 3.9 3.9  AST 22 18 19   ALT 22 28 23   ALKPHOS 76 67 86  BILITOT 0.7 0.7 0.5   Iron/TIBC/Ferritin/ %Sat    Component Value Date/Time   IRON 74 07/01/2018 0944   TIBC 250 07/01/2018 0944   FERRITIN 357 07/01/2018 0944   IRONPCTSAT 30 07/01/2018 0944    Lab Results  Component Value Date   TOTALPROTELP 6.9 10/13/2018   Lab Results  Component Value Date   KPAFRELGTCHN 879.1 (H) 10/13/2018   LAMBDASER 10.4 10/13/2018   KAPLAMBRATIO 84.53 (H) 10/13/2018    RADIOGRAPHIC STUDIES: I have personally reviewed the radiological images as listed and agreed with the findings in the report. 10/23/2018 FINDINGS: The heart size and  mediastinal contours are within normal limits. Both lungs are clear. The visualized skeletal structures are unremarkable. IMPRESSION: No active cardiopulmonary disease.   ASSESSMENT & PLAN:  1. Multiple myeloma not having achieved remission ([place])   2. 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 Thrombocytopenia ([place])   4. Anemia, unspecified type   Light chain multiple myeloma, beta 2 microglobulin 5 and normal albumin.  Stage II. 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  #Multiple myeloma Tolerates 3 cycles of RVD well. Labs reviewed and discussed with patient.  Counts acceptable to proceed with cycle 4 RVD. Velcade 1.83m/m2 Day 1, 8, 15 Revlimid 155mdaily Day 1-14. [ plan to increase if GFR improves to >60]. Dexamethasone 4030meekly Today he will receive day 1 Velcade.  He will start taking Revlimid for 14 days and weekly dexamethasone. He request dexamethasone refill.  Prescription sent to pharmacy. He is going to establish care with [facility] bone marrow transplant team for further evaluation. Discussed with patient that he most likely will need repeat labs and bone marrow biopsy, cardiopulmonary evaluation for eligibility of bone marrow transplant. Continue Acyclovir 400 mg twice daily  as well as aspirin 81 mg daily.  #We discussed about rationale and potential side effects of bone strengthening agent such as bisphosphonate or Xgeva treatment.  Awaiting dental clearance for starting bisphosphonate or Xgeva. Avoid heavy lifting or contact sports at this point. RTC: Repeat labs in 1 week prior to getting day 8 Velcade.  Follow-up 3 weeks repeat multiple myeloma panel at that time.  #Ankle swelling, and physical examination he has very minimal trace edema bilaterally. Continue monitor.  Most likely is going to have 2D echo done as part of bone marrow transplant evaluation.  We spent sufficient time to discuss many aspect of care,  questions were answered to patient's satisfaction.   Earlie Server, MD, PhD Hematology Oncology [facility] at [facility] Pager- [phone] 11/03/2018

## 2018-11-03 NOTE — Progress Notes (Signed)
Patient here for following. Pt states he still has swelling to both ankles and legs. Right eyelid "looks puffy and feels funny."

## 2018-11-04 DIAGNOSIS — C9000 Multiple myeloma not having achieved remission: Secondary | ICD-10-CM | POA: Diagnosis not present

## 2018-11-05 ENCOUNTER — Other Ambulatory Visit: Payer: Self-pay | Admitting: Oncology

## 2018-11-10 ENCOUNTER — Inpatient Hospital Stay: Payer: BLUE CROSS/BLUE SHIELD

## 2018-11-10 VITALS — BP 150/92 | HR 74 | Temp 97.5°F | Resp 18 | Wt 206.3 lb

## 2018-11-10 DIAGNOSIS — E785 Hyperlipidemia, unspecified: Secondary | ICD-10-CM | POA: Diagnosis not present

## 2018-11-10 DIAGNOSIS — R5383 Other fatigue: Secondary | ICD-10-CM | POA: Diagnosis not present

## 2018-11-10 DIAGNOSIS — J209 Acute bronchitis, unspecified: Secondary | ICD-10-CM | POA: Diagnosis not present

## 2018-11-10 DIAGNOSIS — K760 Fatty (change of) liver, not elsewhere classified: Secondary | ICD-10-CM | POA: Diagnosis not present

## 2018-11-10 DIAGNOSIS — Z7982 Long term (current) use of aspirin: Secondary | ICD-10-CM | POA: Diagnosis not present

## 2018-11-10 DIAGNOSIS — G629 Polyneuropathy, unspecified: Secondary | ICD-10-CM | POA: Diagnosis not present

## 2018-11-10 DIAGNOSIS — C9000 Multiple myeloma not having achieved remission: Secondary | ICD-10-CM

## 2018-11-10 DIAGNOSIS — D6959 Other secondary thrombocytopenia: Secondary | ICD-10-CM | POA: Diagnosis not present

## 2018-11-10 DIAGNOSIS — R600 Localized edema: Secondary | ICD-10-CM | POA: Diagnosis not present

## 2018-11-10 DIAGNOSIS — N183 Chronic kidney disease, stage 3 (moderate): Secondary | ICD-10-CM | POA: Diagnosis not present

## 2018-11-10 DIAGNOSIS — Z5111 Encounter for antineoplastic chemotherapy: Secondary | ICD-10-CM | POA: Diagnosis not present

## 2018-11-10 DIAGNOSIS — I129 Hypertensive chronic kidney disease with stage 1 through stage 4 chronic kidney disease, or unspecified chronic kidney disease: Secondary | ICD-10-CM | POA: Diagnosis not present

## 2018-11-10 DIAGNOSIS — D649 Anemia, unspecified: Secondary | ICD-10-CM | POA: Diagnosis not present

## 2018-11-10 DIAGNOSIS — Z79899 Other long term (current) drug therapy: Secondary | ICD-10-CM | POA: Diagnosis not present

## 2018-11-10 MED ORDER — BORTEZOMIB CHEMO SQ INJECTION 3.5 MG (2.5MG/ML)
1.3000 mg/m2 | Freq: Once | INTRAMUSCULAR | Status: AC
  Administered 2018-11-10: 2.75 mg via SUBCUTANEOUS
  Filled 2018-11-10: qty 1.1

## 2018-11-10 MED ORDER — PROCHLORPERAZINE MALEATE 10 MG PO TABS
10.0000 mg | ORAL_TABLET | Freq: Once | ORAL | Status: AC
  Administered 2018-11-10: 10 mg via ORAL
  Filled 2018-11-10: qty 1

## 2018-11-10 NOTE — Progress Notes (Signed)
Per Dr. Tasia Catchings, we can use labs from 11/03/2018 for today's treatment. Patient vital signs are stable. Pharmacy is aware.

## 2018-11-12 ENCOUNTER — Other Ambulatory Visit: Payer: Self-pay | Admitting: Oncology

## 2018-11-12 DIAGNOSIS — C9000 Multiple myeloma not having achieved remission: Secondary | ICD-10-CM

## 2018-11-17 ENCOUNTER — Inpatient Hospital Stay: Payer: BLUE CROSS/BLUE SHIELD | Attending: Oncology

## 2018-11-17 VITALS — BP 174/94 | HR 94 | Temp 97.8°F | Resp 20 | Wt 206.0 lb

## 2018-11-17 DIAGNOSIS — C9000 Multiple myeloma not having achieved remission: Secondary | ICD-10-CM

## 2018-11-17 DIAGNOSIS — Z5111 Encounter for antineoplastic chemotherapy: Secondary | ICD-10-CM | POA: Insufficient documentation

## 2018-11-17 DIAGNOSIS — N189 Chronic kidney disease, unspecified: Secondary | ICD-10-CM | POA: Diagnosis not present

## 2018-11-17 DIAGNOSIS — D696 Thrombocytopenia, unspecified: Secondary | ICD-10-CM | POA: Insufficient documentation

## 2018-11-17 DIAGNOSIS — D649 Anemia, unspecified: Secondary | ICD-10-CM | POA: Diagnosis not present

## 2018-11-17 MED ORDER — BORTEZOMIB CHEMO SQ INJECTION 3.5 MG (2.5MG/ML)
1.3000 mg/m2 | Freq: Once | INTRAMUSCULAR | Status: AC
  Administered 2018-11-17: 2.75 mg via SUBCUTANEOUS
  Filled 2018-11-17: qty 1.1

## 2018-11-17 MED ORDER — PROCHLORPERAZINE MALEATE 10 MG PO TABS
10.0000 mg | ORAL_TABLET | Freq: Once | ORAL | Status: AC
  Administered 2018-11-17: 10 mg via ORAL
  Filled 2018-11-17: qty 1

## 2018-11-17 NOTE — Progress Notes (Signed)
Last labs were on 1/21. Per Dr Janese Banks okay to proceed with today's tx using labs from this date.

## 2018-11-24 ENCOUNTER — Inpatient Hospital Stay: Payer: BLUE CROSS/BLUE SHIELD

## 2018-11-24 ENCOUNTER — Encounter: Payer: Self-pay | Admitting: Oncology

## 2018-11-24 ENCOUNTER — Other Ambulatory Visit: Payer: Self-pay

## 2018-11-24 ENCOUNTER — Inpatient Hospital Stay (HOSPITAL_BASED_OUTPATIENT_CLINIC_OR_DEPARTMENT_OTHER): Payer: BLUE CROSS/BLUE SHIELD | Admitting: Oncology

## 2018-11-24 VITALS — BP 159/99 | HR 81 | Temp 96.4°F | Resp 18 | Wt 205.7 lb

## 2018-11-24 DIAGNOSIS — C9000 Multiple myeloma not having achieved remission: Secondary | ICD-10-CM | POA: Diagnosis not present

## 2018-11-24 DIAGNOSIS — Z5111 Encounter for antineoplastic chemotherapy: Secondary | ICD-10-CM | POA: Diagnosis not present

## 2018-11-24 DIAGNOSIS — D696 Thrombocytopenia, unspecified: Secondary | ICD-10-CM

## 2018-11-24 DIAGNOSIS — D649 Anemia, unspecified: Secondary | ICD-10-CM | POA: Diagnosis not present

## 2018-11-24 DIAGNOSIS — N189 Chronic kidney disease, unspecified: Secondary | ICD-10-CM | POA: Diagnosis not present

## 2018-11-24 LAB — COMPREHENSIVE METABOLIC PANEL
ALT: 25 U/L (ref 0–44)
AST: 18 U/L (ref 15–41)
Albumin: 4.2 g/dL (ref 3.5–5.0)
Alkaline Phosphatase: 79 U/L (ref 38–126)
Anion gap: 3 — ABNORMAL LOW (ref 5–15)
BUN: 22 mg/dL (ref 8–23)
CO2: 25 mmol/L (ref 22–32)
Calcium: 9 mg/dL (ref 8.9–10.3)
Chloride: 112 mmol/L — ABNORMAL HIGH (ref 98–111)
Creatinine, Ser: 1.27 mg/dL — ABNORMAL HIGH (ref 0.61–1.24)
GFR calc Af Amer: >60 mL/min (ref >60)
GFR calc non Af Amer: >60 mL/min (ref >60)
Glucose, Bld: 116 mg/dL — ABNORMAL HIGH (ref 70–99)
Potassium: 4.4 mmol/L (ref 3.5–5.1)
Sodium: 140 mmol/L (ref 135–145)
Total Bilirubin: 0.6 mg/dL (ref 0.3–1.2)
Total Protein: 6.8 g/dL (ref 6.5–8.1)

## 2018-11-24 LAB — CBC WITH DIFFERENTIAL/PLATELET
Abs Immature Granulocytes: 0.02 10*3/uL (ref 0.00–0.07)
Basophils Absolute: 0 10*3/uL (ref 0.0–0.1)
Basophils Relative: 1 %
Eosinophils Absolute: 0.1 10*3/uL (ref 0.0–0.5)
Eosinophils Relative: 1 %
HCT: 32 % — ABNORMAL LOW (ref 39.0–52.0)
Hemoglobin: 10.5 g/dL — ABNORMAL LOW (ref 13.0–17.0)
IMMATURE GRANULOCYTES: 1 %
Lymphocytes Relative: 16 %
Lymphs Abs: 0.7 10*3/uL (ref 0.7–4.0)
MCH: 31.1 pg (ref 26.0–34.0)
MCHC: 32.8 g/dL (ref 30.0–36.0)
MCV: 94.7 fL (ref 80.0–100.0)
Monocytes Absolute: 0.3 10*3/uL (ref 0.1–1.0)
Monocytes Relative: 7 %
NEUTROS PCT: 74 %
Neutro Abs: 3.2 10*3/uL (ref 1.7–7.7)
Platelets: 125 10*3/uL — ABNORMAL LOW (ref 150–400)
RBC: 3.38 MIL/uL — ABNORMAL LOW (ref 4.22–5.81)
RDW: 15.3 % (ref 11.5–15.5)
WBC: 4.3 10*3/uL (ref 4.0–10.5)
nRBC: 0 % (ref 0.0–0.2)

## 2018-11-24 MED ORDER — BORTEZOMIB CHEMO SQ INJECTION 3.5 MG (2.5MG/ML)
1.3000 mg/m2 | Freq: Once | INTRAMUSCULAR | Status: AC
  Administered 2018-11-24: 2.75 mg via SUBCUTANEOUS
  Filled 2018-11-24: qty 1.1

## 2018-11-24 MED ORDER — PROCHLORPERAZINE MALEATE 10 MG PO TABS
10.0000 mg | ORAL_TABLET | Freq: Once | ORAL | Status: AC
  Administered 2018-11-24: 10 mg via ORAL
  Filled 2018-11-24: qty 1

## 2018-11-24 MED ORDER — LENALIDOMIDE 25 MG PO CAPS: 25.0000 mg | ORAL_CAPSULE | Freq: Every day | ORAL | 0 refills | Status: DC

## 2018-11-24 NOTE — Progress Notes (Signed)
Hematology/Oncology  Follow up note [facility] [phone] [phone]   Patient Care Team: Tonia Ghent, MD as PCP - General (Family Medicine)  REFERRING PROVIDER: Napoleon Form REASON FOR VISIT Follow up for bone marrow results/PET scan and discussion of diagnosis of multiple myeloma.   HISTORY OF PRESENTING ILLNESS:  Luis Burke is a  63 y.o.  male with PMH listed below who was referred to me for evaluation of anemia and thrombocytopenia Associated signs and symptoms: Patient reports feeling fatigue.  Denies SOB with exertion.  Denies weight loss, easy bruising, hematochezia, hemoptysis, hematuria. Context: History of GI bleeding: Denies               History of Chronic kidney disease yes               History of autoimmune disease denies denies               History of hemolytic anemia.                Last colonoscopy: 07/20/2018 upper and lower endoscopy showed esophagitis, normal examined duodenum, 2 polyps removed in the ascending colon, resected and retrieved.  Otherwise normal. He drinks alcohol, usually a few beers during the weekends.  Denies any smoking history.   Intended weight loss 3 pounds for the past 3 months.  # 07/27/2018 multiple myeloma panel showed M protein of 0.1, IgG 662, IgA 49, IgM 7. Patient was called back to further labs done. 08/10/2018, free light chain ratio showed extremely high level of kappa free light chain 10,183, with a kappa lambda light chain ratio of 1414.31 LDH 164 Beta-2 microglobulin 5 Patient was called and discuss about results.  He was recommended to undergo bone marrow biopsy and PET scan. 08/19/2018 bone marrow biopsy showed hypercellular marrow 80%, involved by plasma cell neoplasm up to 95%.  Consistent with plasma cell myeloma.  FISH and cytogenetics are pending.  09/10/2018 skeletal survey showed questionable small lucent lesions within the midshaft of the humerus bilaterally.   Otherwise no suspicious focal lytic lesion or acute bone abnormality. 08/25/2018 PET scan showed no definite hypermetabolic bone disease but CT findings are highly suspicious for numerous small myelomatous lesions involving the spine, sternum and scattered ribs.   INTERVAL HISTORY Luis Burke is a 63 y.o. male who has above history reviewed by me today for a follow-up visit for management of multiple myeloma. He has established  Patient has started on Velcade and dexamethasone treatment. He also received Revlimid and started Revlimid on 09/06/2018.  Tolerates well.  Denies experiencing any side effects so far, except transient left fingertip numbness which spontaneously resolved.  Today he is accompanied by his significant other.  Patient also brought his medications and have questions about medications. Blood pressure has been running high for the past couple visits.  I have communicated with patient's primary care physician and his Toprol XL has been increased to 100 mg daily.  Norvasc has been increased to 10 mg daily.  Denies any bone pain.    INTERVAL HISTORY Luis Burke 63 y.o.  male with above history who presents for evaluation prior to cycle 5 RVD for treatment of multiple myeloma.  He has established care with [facility] BMT team and was recommended to continue 2 cycles of multiple myeloma treatment and be re-evaluated.  Reports feeling well.  Denies any numbness or tingling.  He does not sleep very well.  Wakes up multiple times during the  night.  Good appetite.  Weight has been stable.   Review of Systems  Constitutional: Negative for appetite change, chills, fatigue, fever and unexpected weight change.  HENT:   Negative for hearing loss and voice change.   Eyes: Negative for eye problems and icterus.  Respiratory: Negative for chest tightness, cough and shortness of breath.   Cardiovascular: Negative for chest pain and leg swelling.  Gastrointestinal: Negative for  abdominal distention and abdominal pain.  Endocrine: Negative for hot flashes.  Genitourinary: Negative for difficulty urinating, dysuria and frequency.   Musculoskeletal: Negative for arthralgias.  Skin: Negative for itching and rash.  Neurological: Negative for light-headedness and numbness.  Hematological: Negative for adenopathy. Does not bruise/bleed easily.  Psychiatric/Behavioral: Negative for confusion.    MEDICAL HISTORY:  Past Medical History:  Diagnosis Date  . Anemia   . Fatty liver    on u/s 07/2018  . Hyperlipidemia 03/2004  . Hypertension 1994  . Snores     SURGICAL HISTORY: Past Surgical History:  Procedure Laterality Date  . CATARACT EXTRACTION W/PHACO Right 03/04/2018   Procedure: CATARACT EXTRACTION PHACO AND INTRAOCULAR LENS PLACEMENT ([place])  RIGHT TORIC;  Surgeon: Leandrew Koyanagi, MD;  Location: [place];  Service: Ophthalmology;  Laterality: Right;  Per [place] no Toric Lens 1:45 5.3  . COLONOSCOPY WITH PROPOFOL N/A 07/20/2018   Procedure: COLONOSCOPY WITH PROPOFOL;  Surgeon: Jonathon Bellows, MD;  Location: [facility] ENDOSCOPY;  Service: Gastroenterology;  Laterality: N/A;  . ESOPHAGOGASTRODUODENOSCOPY (EGD) WITH PROPOFOL N/A 07/20/2018   Procedure: ESOPHAGOGASTRODUODENOSCOPY (EGD) WITH PROPOFOL;  Surgeon: Jonathon Bellows, MD;  Location: [facility] ENDOSCOPY;  Service: Gastroenterology;  Laterality: N/A;  . EYE SURGERY Right   . HERNIA REPAIR    . L Lap herniorraphy  04/2000  . Left wrist ganglionectomy      SOCIAL HISTORY: Social History   Socioeconomic History  . Marital status: Single    Spouse name: Not on file  . Number of children: 1  . Years of education: Not on file  . Highest education level: Not on file  Occupational History  . Occupation: [employer]  Social Needs  . Financial resource strain: Not on file  . Food insecurity:    Worry: Not on file    Inability: Not on file  . Transportation needs:    Medical: Not on file    Non-medical: Not  on file  Tobacco Use  . Smoking status: Never Smoker  . Smokeless tobacco: Never Used  . Tobacco comment: Occassionally  Substance and Sexual Activity  . Alcohol use: Not Currently    Alcohol/week: 3.0 standard drinks    Types: 3 Cans of beer per week  . Drug use: No  . Sexual activity: Not on file  Lifestyle  . Physical activity:    Days per week: Not on file    Minutes per session: Not on file  . Stress: Not on file  Relationships  . Social connections:    Talks on phone: Not on file    Gets together: Not on file    Attends religious service: Not on file    Active member of club or organization: Not on file    Attends meetings of clubs or organizations: Not on file    Relationship status: Not on file  . Intimate partner violence:    Fear of current or ex partner: Not on file    Emotionally abused: Not on file    Physically abused: Not on file    Forced sexual  activity: Not on file  Other Topics Concern  . Not on file  Social History Narrative   Divorced 26-Dec-2007, dating as of 12-25-2017   His daughter died 4 days after giving birth to the patient's granddaughter   Has joint custody of his dead daughter's child   Works at [employer] is [employer]    FAMILY HISTORY: Family History  Problem Relation Age of Onset  . Hypertension Mother   . Diabetes Mother   . Heart disease Mother        CAD  . Dementia Mother   . Hypertension Father   . Heart disease Father        MI 02/03  . Colon cancer Father   . Hypertension Sister   . Hypertension Sister   . Hypertension Sister   . Prostate cancer Neg Hx     ALLERGIES:  is allergic to tadalafil.  MEDICATIONS:  Current Outpatient Medications  Medication Sig Dispense Refill  . acetaminophen (TYLENOL) 325 MG tablet Take 650 mg by mouth every 6 (six) hours as needed.    Marland Kitchen acyclovir (ZOVIRAX) 400 MG tablet Take 1 tablet (400 mg total) by mouth 2 (two) times daily. 60 tablet 3  . amLODipine (NORVASC) 5 MG tablet Take 1-2 tablets  (5-10 mg total) by mouth daily. (Patient taking differently: Take 10 mg by mouth daily. ) 180 tablet 3  . co-enzyme Q-10 50 MG capsule Take 50 mg by mouth daily.    . [employer] VITAMIN B12 1000 MCG tablet TAKE 1 TABLET BY MOUTH EVERY DAY 90 tablet 1  . dexamethasone (DECADRON) 4 MG tablet Take 10 tablets (40 mg total) by mouth once a week. 30 tablet 0  . metoprolol succinate (TOPROL-XL) 50 MG 24 hr tablet Take 2-3 tablets (100-150 mg total) by mouth daily. (Patient taking differently: Take 150 mg by mouth daily. ) 270 tablet 3  . guaiFENesin (MUCINEX) 600 MG 12 hr tablet Take 1 tablet (600 mg total) by mouth 2 (two) times daily. (Patient not taking: Reported on 11/03/2018) 30 tablet 0  . lenalidomide (REVLIMID) 25 MG capsule Take 1 capsule (25 mg total) by mouth daily. 14 capsule 0  . oxyCODONE (ROXICODONE) 5 MG immediate release tablet Take 0.5-1 tablets (2.5-5 mg total) by mouth 3 (three) times daily as needed (sedation caution). (Patient not taking: Reported on 10/23/2018) 15 tablet 0   No current facility-administered medications for this visit.      PHYSICAL EXAMINATION: ECOG PERFORMANCE STATUS: 0 - Asymptomatic Vitals:   11/24/18 0906  BP: (!) 159/99  Pulse: 81  Resp: 18  Temp: (!) 96.4 F (35.8 C)   Filed Weights   11/24/18 0906  Weight: 205 lb 11.2 oz (93.3 kg)    Physical Exam Constitutional:      General: He is not in acute distress. HENT:     Head: Normocephalic and atraumatic.  Eyes:     General: No scleral icterus.    Pupils: Pupils are equal, round, and reactive to light.  Neck:     Musculoskeletal: Normal range of motion and neck supple.  Cardiovascular:     Rate and Rhythm: Normal rate and regular rhythm.     Heart sounds: Normal heart sounds.  Pulmonary:     Effort: Pulmonary effort is normal. No respiratory distress.     Breath sounds: No wheezing.  Abdominal:     General: Bowel sounds are normal. There is no distension.     Palpations: Abdomen is soft. There  is  no mass.     Tenderness: There is no abdominal tenderness.  Musculoskeletal: Normal range of motion.        General: No deformity.     Comments: Trace edema at ankle level bilaterally.  Skin:    General: Skin is warm and dry.     Findings: No erythema or rash.  Neurological:     Mental Status: He is alert and oriented to person, place, and time.     Cranial Nerves: No cranial nerve deficit.     Coordination: Coordination normal.  Psychiatric:        Behavior: Behavior normal.        Thought Content: Thought content normal.      LABORATORY DATA:  I have reviewed the data as listed Lab Results  Component Value Date   WBC 4.3 11/24/2018   HGB 10.5 (L) 11/24/2018   HCT 32.0 (L) 11/24/2018   MCV 94.7 11/24/2018   PLT 125 (L) 11/24/2018   Recent Labs    10/27/18 1328 11/03/18 0850 11/24/18 0848  NA 138 139 140  K 3.9 4.3 4.4  CL 104 108 112*  CO2 25 24 25   GLUCOSE 144* 151* 116*  BUN 24* 24* 22  CREATININE 1.35* 1.42* 1.27*  CALCIUM 9.0 8.9 9.0  GFRNONAA 56* 53* >60  GFRAA >60 >60 >60  PROT 6.5 6.5 6.8  ALBUMIN 3.9 3.9 4.2  AST 18 19 18   ALT 28 23 25   ALKPHOS 67 86 79  BILITOT 0.7 0.5 0.6   Iron/TIBC/Ferritin/ %Sat    Component Value Date/Time   IRON 74 07/01/2018 0944   TIBC 250 07/01/2018 0944   FERRITIN 357 07/01/2018 0944   IRONPCTSAT 30 07/01/2018 0944    Lab Results  Component Value Date   TOTALPROTELP 6.9 10/13/2018   Lab Results  Component Value Date   KPAFRELGTCHN 879.1 (H) 10/13/2018   LAMBDASER 10.4 10/13/2018   KAPLAMBRATIO 84.53 (H) 10/13/2018    RADIOGRAPHIC STUDIES: I have personally reviewed the radiological images as listed and agreed with the findings in the report. 10/23/2018 FINDINGS: The heart size and mediastinal contours are within normal limits. Both lungs are clear. The visualized skeletal structures are unremarkable. IMPRESSION: No active cardiopulmonary disease.   ASSESSMENT & PLAN:  1. Multiple myeloma not  having achieved remission ([place])   2. 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 Thrombocytopenia ([place])   4. Encounter for antineoplastic chemotherapy   Light chain multiple myeloma, beta 2 microglobulin 5 and normal albumin.  Stage II. cytogenetics showed normal male chromosome, MDS FISH panel showed trisomy 46- Standard Risk.  #Multiple myeloma Tolerates 4 cycles of RVD well. Multiple myeloma panel done on 11/09/2018 at [facility] showed light chain ratio has decreased to 20, Per patient, [facility] oncology recommend continue another 2 cycles of treatment. Notes from [facility], The oncology is not currently available via care everywhere. Labs were reviewed and discussed with patient.  Counts acceptable proceed with cycle 5 of RVD.    Velcade 1.19m/m2 Day 1, 8, 15 Revlimid 28mdaily Day 1-14. [  Start at Cycle 5 as GFR>60 ]. Dexamethasone [id number] Today he will receive day 1 Velcade.  New dose trans-Revlimid will be delivered to him and he will start Revlimid 25 mg for 14 days. Continue Acyclovir 400 mg twice daily as well as aspirin 81 mg daily.  Thrombocytopenia and anemia secondary to chemotherapy treatment.  Hemoglobin has improved since last visit.  Thrombocytopenia stable.  Continue to monitor.  RTC: Proceed with day  8 Velcade on 12/01/2018 .  Lab encounter and proceed with day 15 of Velcade on 12/08/2018. Follow-up 3 weeks repeat multiple myeloma panel at that time.   Earlie Server, MD, PhD Hematology Oncology [facility] Pager- [phone] 11/24/2018

## 2018-11-24 NOTE — Progress Notes (Signed)
Patient here for follow up. No concerns voiced. Revlimid on hold.

## 2018-11-25 LAB — KAPPA/LAMBDA LIGHT CHAINS
Kappa free light chain: 319.8 mg/L — ABNORMAL HIGH (ref 3.3–19.4)
Kappa, lambda light chain ratio: 26.21 — ABNORMAL HIGH (ref 0.26–1.65)
Lambda free light chains: 12.2 mg/L (ref 5.7–26.3)

## 2018-11-26 LAB — MULTIPLE MYELOMA PANEL, SERUM
Albumin SerPl Elph-Mcnc: 4 g/dL (ref 2.9–4.4)
Albumin/Glob SerPl: 1.8 — ABNORMAL HIGH (ref 0.7–1.7)
Alpha 1: 0.2 g/dL (ref 0.0–0.4)
Alpha2 Glob SerPl Elph-Mcnc: 0.6 g/dL (ref 0.4–1.0)
B-Globulin SerPl Elph-Mcnc: 1 g/dL (ref 0.7–1.3)
Gamma Glob SerPl Elph-Mcnc: 0.5 g/dL (ref 0.4–1.8)
Globulin, Total: 2.3 g/dL (ref 2.2–3.9)
IgA: 151 mg/dL (ref 61–437)
IgG (Immunoglobin G), Serum: 617 mg/dL — ABNORMAL LOW (ref 700–1600)
IgM (Immunoglobulin M), Srm: 16 mg/dL — ABNORMAL LOW (ref 20–172)
Total Protein ELP: 6.3 g/dL (ref 6.0–8.5)

## 2018-12-01 ENCOUNTER — Inpatient Hospital Stay: Payer: BLUE CROSS/BLUE SHIELD

## 2018-12-01 VITALS — BP 176/99 | HR 88 | Temp 95.0°F | Resp 18

## 2018-12-01 DIAGNOSIS — D649 Anemia, unspecified: Secondary | ICD-10-CM | POA: Diagnosis not present

## 2018-12-01 DIAGNOSIS — N189 Chronic kidney disease, unspecified: Secondary | ICD-10-CM | POA: Diagnosis not present

## 2018-12-01 DIAGNOSIS — C9000 Multiple myeloma not having achieved remission: Secondary | ICD-10-CM

## 2018-12-01 DIAGNOSIS — D696 Thrombocytopenia, unspecified: Secondary | ICD-10-CM | POA: Diagnosis not present

## 2018-12-01 DIAGNOSIS — Z5111 Encounter for antineoplastic chemotherapy: Secondary | ICD-10-CM | POA: Diagnosis not present

## 2018-12-01 MED ORDER — PROCHLORPERAZINE MALEATE 10 MG PO TABS
10.0000 mg | ORAL_TABLET | Freq: Once | ORAL | Status: AC
  Administered 2018-12-01: 10 mg via ORAL
  Filled 2018-12-01: qty 1

## 2018-12-01 MED ORDER — BORTEZOMIB CHEMO SQ INJECTION 3.5 MG (2.5MG/ML)
1.3000 mg/m2 | Freq: Once | INTRAMUSCULAR | Status: AC
  Administered 2018-12-01: 2.75 mg via SUBCUTANEOUS
  Filled 2018-12-01: qty 1.1

## 2018-12-08 ENCOUNTER — Inpatient Hospital Stay: Payer: BLUE CROSS/BLUE SHIELD

## 2018-12-08 VITALS — BP 162/90 | HR 83 | Temp 96.7°F | Resp 20 | Wt 209.0 lb

## 2018-12-08 DIAGNOSIS — D696 Thrombocytopenia, unspecified: Secondary | ICD-10-CM | POA: Diagnosis not present

## 2018-12-08 DIAGNOSIS — Z5111 Encounter for antineoplastic chemotherapy: Secondary | ICD-10-CM | POA: Diagnosis not present

## 2018-12-08 DIAGNOSIS — C9000 Multiple myeloma not having achieved remission: Secondary | ICD-10-CM

## 2018-12-08 DIAGNOSIS — N189 Chronic kidney disease, unspecified: Secondary | ICD-10-CM | POA: Diagnosis not present

## 2018-12-08 DIAGNOSIS — D649 Anemia, unspecified: Secondary | ICD-10-CM | POA: Diagnosis not present

## 2018-12-08 LAB — COMPREHENSIVE METABOLIC PANEL
ALBUMIN: 4.1 g/dL (ref 3.5–5.0)
ALT: 39 U/L (ref 0–44)
AST: 27 U/L (ref 15–41)
Alkaline Phosphatase: 76 U/L (ref 38–126)
Anion gap: 8 (ref 5–15)
BUN: 21 mg/dL (ref 8–23)
CHLORIDE: 102 mmol/L (ref 98–111)
CO2: 24 mmol/L (ref 22–32)
Calcium: 8.8 mg/dL — ABNORMAL LOW (ref 8.9–10.3)
Creatinine, Ser: 1.37 mg/dL — ABNORMAL HIGH (ref 0.61–1.24)
GFR calc Af Amer: >60 mL/min (ref >60)
GFR calc non Af Amer: 55 mL/min — ABNORMAL LOW (ref >60)
Glucose, Bld: 184 mg/dL — ABNORMAL HIGH (ref 70–99)
Potassium: 4 mmol/L (ref 3.5–5.1)
Sodium: 134 mmol/L — ABNORMAL LOW (ref 135–145)
Total Bilirubin: 0.6 mg/dL (ref 0.3–1.2)
Total Protein: 6.8 g/dL (ref 6.5–8.1)

## 2018-12-08 LAB — CBC WITH DIFFERENTIAL/PLATELET
Abs Immature Granulocytes: 0.05 10*3/uL (ref 0.00–0.07)
Basophils Absolute: 0 10*3/uL (ref 0.0–0.1)
Basophils Relative: 0 %
Eosinophils Absolute: 0 10*3/uL (ref 0.0–0.5)
Eosinophils Relative: 0 %
HCT: 33.2 % — ABNORMAL LOW (ref 39.0–52.0)
Hemoglobin: 11.2 g/dL — ABNORMAL LOW (ref 13.0–17.0)
Immature Granulocytes: 1 %
Lymphocytes Relative: 7 %
Lymphs Abs: 0.5 10*3/uL — ABNORMAL LOW (ref 0.7–4.0)
MCH: 31.1 pg (ref 26.0–34.0)
MCHC: 33.7 g/dL (ref 30.0–36.0)
MCV: 92.2 fL (ref 80.0–100.0)
MONOS PCT: 2 %
Monocytes Absolute: 0.1 10*3/uL (ref 0.1–1.0)
Neutro Abs: 5.8 10*3/uL (ref 1.7–7.7)
Neutrophils Relative %: 90 %
Platelets: 155 10*3/uL (ref 150–400)
RBC: 3.6 MIL/uL — ABNORMAL LOW (ref 4.22–5.81)
RDW: 14.7 % (ref 11.5–15.5)
WBC: 6.5 10*3/uL (ref 4.0–10.5)
nRBC: 0 % (ref 0.0–0.2)

## 2018-12-08 MED ORDER — BORTEZOMIB CHEMO SQ INJECTION 3.5 MG (2.5MG/ML)
1.3000 mg/m2 | Freq: Once | INTRAMUSCULAR | Status: AC
  Administered 2018-12-08: 2.75 mg via SUBCUTANEOUS
  Filled 2018-12-08: qty 1.1

## 2018-12-08 MED ORDER — PROCHLORPERAZINE MALEATE 10 MG PO TABS
10.0000 mg | ORAL_TABLET | Freq: Once | ORAL | Status: AC
  Administered 2018-12-08: 10 mg via ORAL
  Filled 2018-12-08: qty 1

## 2018-12-15 ENCOUNTER — Telehealth: Payer: Self-pay | Admitting: *Deleted

## 2018-12-15 ENCOUNTER — Other Ambulatory Visit: Payer: Self-pay | Admitting: *Deleted

## 2018-12-15 MED ORDER — LENALIDOMIDE 25 MG PO CAPS: ORAL_CAPSULE | ORAL | 0 refills | Status: DC

## 2018-12-15 NOTE — Telephone Encounter (Signed)
Spoke with pt.  Advised him that his [facility] doc, Dr Maylene Roes, has not been in contact with [place] about upcoming plan and how to proceed with treatment plan.    I called Dr Maylene Roes office, he has not completed the [place] notes from 11/04/18, that's why we are unable to see on [place] and why we have not received any notes from the [place].  Nursing staff told me that she would have dr Maylene Roes complete and fax [place] notes.   Schedulers added pt to come in tomorrow for Lab / Md / Velcade, refill for Revlimid sent to his pharmacy.

## 2018-12-15 NOTE — Telephone Encounter (Signed)
Patient called asking about when he is to come back, he has no follow up appointments scheduled. Please call patient with appointment.[phone]

## 2018-12-16 ENCOUNTER — Other Ambulatory Visit: Payer: Self-pay

## 2018-12-16 ENCOUNTER — Encounter: Payer: Self-pay | Admitting: Oncology

## 2018-12-16 ENCOUNTER — Inpatient Hospital Stay (HOSPITAL_BASED_OUTPATIENT_CLINIC_OR_DEPARTMENT_OTHER): Payer: BLUE CROSS/BLUE SHIELD | Admitting: Oncology

## 2018-12-16 ENCOUNTER — Inpatient Hospital Stay: Payer: BLUE CROSS/BLUE SHIELD | Attending: Oncology

## 2018-12-16 ENCOUNTER — Inpatient Hospital Stay: Payer: BLUE CROSS/BLUE SHIELD

## 2018-12-16 VITALS — BP 168/93 | HR 78 | Temp 97.2°F | Wt 212.0 lb

## 2018-12-16 DIAGNOSIS — H00039 Abscess of eyelid unspecified eye, unspecified eyelid: Secondary | ICD-10-CM | POA: Diagnosis not present

## 2018-12-16 DIAGNOSIS — R600 Localized edema: Secondary | ICD-10-CM | POA: Insufficient documentation

## 2018-12-16 DIAGNOSIS — G629 Polyneuropathy, unspecified: Secondary | ICD-10-CM

## 2018-12-16 DIAGNOSIS — D696 Thrombocytopenia, unspecified: Secondary | ICD-10-CM

## 2018-12-16 DIAGNOSIS — C9000 Multiple myeloma not having achieved remission: Secondary | ICD-10-CM

## 2018-12-16 DIAGNOSIS — D6959 Other secondary thrombocytopenia: Secondary | ICD-10-CM | POA: Diagnosis not present

## 2018-12-16 DIAGNOSIS — N179 Acute kidney failure, unspecified: Secondary | ICD-10-CM | POA: Insufficient documentation

## 2018-12-16 DIAGNOSIS — D6481 Anemia due to antineoplastic chemotherapy: Secondary | ICD-10-CM | POA: Insufficient documentation

## 2018-12-16 DIAGNOSIS — Z7982 Long term (current) use of aspirin: Secondary | ICD-10-CM | POA: Insufficient documentation

## 2018-12-16 DIAGNOSIS — Z79899 Other long term (current) drug therapy: Secondary | ICD-10-CM | POA: Insufficient documentation

## 2018-12-16 DIAGNOSIS — Z5112 Encounter for antineoplastic immunotherapy: Secondary | ICD-10-CM | POA: Insufficient documentation

## 2018-12-16 DIAGNOSIS — Z5111 Encounter for antineoplastic chemotherapy: Secondary | ICD-10-CM

## 2018-12-16 LAB — CBC WITH DIFFERENTIAL/PLATELET
Abs Immature Granulocytes: 0.02 10*3/uL (ref 0.00–0.07)
BASOS PCT: 1 %
Basophils Absolute: 0 10*3/uL (ref 0.0–0.1)
Eosinophils Absolute: 0.1 10*3/uL (ref 0.0–0.5)
Eosinophils Relative: 3 %
HCT: 32.8 % — ABNORMAL LOW (ref 39.0–52.0)
Hemoglobin: 11.1 g/dL — ABNORMAL LOW (ref 13.0–17.0)
IMMATURE GRANULOCYTES: 0 %
Lymphocytes Relative: 17 %
Lymphs Abs: 1 10*3/uL (ref 0.7–4.0)
MCH: 32 pg (ref 26.0–34.0)
MCHC: 33.8 g/dL (ref 30.0–36.0)
MCV: 94.5 fL (ref 80.0–100.0)
Monocytes Absolute: 0.5 10*3/uL (ref 0.1–1.0)
Monocytes Relative: 9 %
NEUTROS ABS: 3.9 10*3/uL (ref 1.7–7.7)
NEUTROS PCT: 70 %
Platelets: 121 10*3/uL — ABNORMAL LOW (ref 150–400)
RBC: 3.47 MIL/uL — ABNORMAL LOW (ref 4.22–5.81)
RDW: 15.2 % (ref 11.5–15.5)
WBC: 5.6 10*3/uL (ref 4.0–10.5)
nRBC: 0 % (ref 0.0–0.2)

## 2018-12-16 LAB — COMPREHENSIVE METABOLIC PANEL
ALBUMIN: 4.3 g/dL (ref 3.5–5.0)
ALT: 26 U/L (ref 0–44)
AST: 18 U/L (ref 15–41)
Alkaline Phosphatase: 74 U/L (ref 38–126)
Anion gap: 6 (ref 5–15)
BUN: 24 mg/dL — ABNORMAL HIGH (ref 8–23)
CALCIUM: 9.1 mg/dL (ref 8.9–10.3)
CO2: 24 mmol/L (ref 22–32)
Chloride: 110 mmol/L (ref 98–111)
Creatinine, Ser: 1.31 mg/dL — ABNORMAL HIGH (ref 0.61–1.24)
GFR calc Af Amer: >60 mL/min (ref >60)
GFR calc non Af Amer: 58 mL/min — ABNORMAL LOW (ref >60)
Glucose, Bld: 109 mg/dL — ABNORMAL HIGH (ref 70–99)
Potassium: 4.6 mmol/L (ref 3.5–5.1)
Sodium: 140 mmol/L (ref 135–145)
Total Bilirubin: 0.7 mg/dL (ref 0.3–1.2)
Total Protein: 7 g/dL (ref 6.5–8.1)

## 2018-12-16 MED ORDER — BORTEZOMIB CHEMO SQ INJECTION 3.5 MG (2.5MG/ML)
1.3000 mg/m2 | Freq: Once | INTRAMUSCULAR | Status: AC
  Administered 2018-12-16: 2.75 mg via SUBCUTANEOUS
  Filled 2018-12-16: qty 1.1

## 2018-12-16 MED ORDER — PROCHLORPERAZINE MALEATE 10 MG PO TABS
10.0000 mg | ORAL_TABLET | Freq: Once | ORAL | Status: AC
  Administered 2018-12-16: 10 mg via ORAL
  Filled 2018-12-16: qty 1

## 2018-12-16 NOTE — Progress Notes (Signed)
Hematology/Oncology  Follow up note [facility] [phone] [phone]   Patient Care Team: Tonia Ghent, MD as PCP - General (Family Medicine)  REFERRING PROVIDER: Napoleon Form REASON FOR VISIT Follow up for bone marrow results/PET scan and discussion of diagnosis of multiple myeloma.   HISTORY OF PRESENTING ILLNESS:  Luis Burke is a  63 y.o.  male with PMH listed below who was referred to me for evaluation of anemia and thrombocytopenia Associated signs and symptoms: Patient reports feeling fatigue.  Denies SOB with exertion.  Denies weight loss, easy bruising, hematochezia, hemoptysis, hematuria. Context: History of GI bleeding: Denies               History of Chronic kidney disease yes               History of autoimmune disease denies denies               History of hemolytic anemia.                Last colonoscopy: 07/20/2018 upper and lower endoscopy showed esophagitis, normal examined duodenum, 2 polyps removed in the ascending colon, resected and retrieved.  Otherwise normal. He drinks alcohol, usually a few beers during the weekends.  Denies any smoking history.   Intended weight loss 3 pounds for the past 3 months.  # 07/27/2018 multiple myeloma panel showed M protein of 0.1, IgG 662, IgA 49, IgM 7. Patient was called back to further labs done. 08/10/2018, free light chain ratio showed extremely high level of kappa free light chain 10,183, with a kappa lambda light chain ratio of 1414.31 LDH 164 Beta-2 microglobulin 5 Patient was called and discuss about results.  He was recommended to undergo bone marrow biopsy and PET scan. 08/19/2018 bone marrow biopsy showed hypercellular marrow 80%, involved by plasma cell neoplasm up to 95%.  Consistent with plasma cell myeloma.  FISH and cytogenetics are pending.  09/10/2018 skeletal survey showed questionable small lucent lesions within the midshaft of the humerus bilaterally.   Otherwise no suspicious focal lytic lesion or acute bone abnormality. 08/25/2018 PET scan showed no definite hypermetabolic bone disease but CT findings are highly suspicious for numerous small myelomatous lesions involving the spine, sternum and scattered ribs.   INTERVAL HISTORY Luis Burke is a 63 y.o. male who has above history reviewed by me today for a follow-up visit for management of multiple myeloma. He has established care with [facility] Dr.Choi on 11/04/2018.  His official recommendations not available via care everywhere.  We have placed a call to Dr. Jeannine Kitten office and his nurse will send him a message to finalize his notes. Per patient Dr. Maylene Roes recommends continue fifth and sixth cycle of RVD. Patient is status post 5 cycles of RVD.  Reports feeling well.  Minimal neuropathy. He continues to have chronic bilateral lower extremity/ankle edema, worse at the end of the day.  We previously discussed and asked patient to have low-salt diet. Patient's blood pressure is 168/93.  Patient is on Norvasc 10 mg daily as well as Toprol 150 mg daily.  Denies any headache, blurry vision, focal weakness.  Denies any bone pain.     Review of Systems  Constitutional: Negative for appetite change, chills, fatigue, fever and unexpected weight change.  HENT:   Negative for hearing loss and voice change.   Eyes: Negative for eye problems and icterus.  Respiratory: Negative for chest tightness, cough and shortness of breath.   Cardiovascular:  Negative for chest pain.  Gastrointestinal: Negative for abdominal distention and abdominal pain.  Endocrine: Negative for hot flashes.  Genitourinary: Negative for difficulty urinating, dysuria and frequency.   Musculoskeletal: Negative for arthralgias.       Ankle swelling  Skin: Negative for itching and rash.  Neurological: Negative for light-headedness and numbness.  Hematological: Negative for adenopathy. Does not bruise/bleed easily.    Psychiatric/Behavioral: Negative for confusion.    MEDICAL HISTORY:  Past Medical History:  Diagnosis Date  . Anemia   . Fatty liver    on u/s 07/2018  . Hyperlipidemia 03/2004  . Hypertension 1994  . Snores     SURGICAL HISTORY: Past Surgical History:  Procedure Laterality Date  . CATARACT EXTRACTION W/PHACO Right 03/04/2018   Procedure: CATARACT EXTRACTION PHACO AND INTRAOCULAR LENS PLACEMENT ([place])  RIGHT TORIC;  Surgeon: Leandrew Koyanagi, MD;  Location: [place];  Service: Ophthalmology;  Laterality: Right;  Per [place] no Toric Lens 1:45 5.3  . COLONOSCOPY WITH PROPOFOL N/A 07/20/2018   Procedure: COLONOSCOPY WITH PROPOFOL;  Surgeon: Jonathon Bellows, MD;  Location[facility] ENDOSCOPY;  Service: Gastroenterology;  Laterality: N/A;  . ESOPHAGOGASTRODUODENOSCOPY (EGD) WITH PROPOFOL N/A 07/20/2018   Procedure: ESOPHAGOGASTRODUODENOSCOPY (EGD) WITH PROPOFOL;  Surgeon: Jonathon Bellows, MD;  Location: [facility] ENDOSCOPY;  Service: Gastroenterology;  Laterality: N/A;  . EYE SURGERY Right   . HERNIA REPAIR    . L Lap herniorraphy  04/2000  . Left wrist ganglionectomy      SOCIAL HISTORY: Social History   Socioeconomic History  . Marital status: Single    Spouse name: Not on file  . Number of children: 1  . Years of education: Not on file  . Highest education level: Not on file  Occupational History  . Occupation: [employer]  Social Needs  . Financial resource strain: Not on file  . Food insecurity:    Worry: Not on file    Inability: Not on file  . Transportation needs:    Medical: Not on file    Non-medical: Not on file  Tobacco Use  . Smoking status: Never Smoker  . Smokeless tobacco: Never Used  . Tobacco comment: Occassionally  Substance and Sexual Activity  . Alcohol use: Not Currently    Alcohol/week: 3.0 standard drinks    Types: 3 Cans of beer per week  . Drug use: No  . Sexual activity: Not on file  Lifestyle  . Physical activity:    Days per week: Not on  file    Minutes per session: Not on file  . Stress: Not on file  Relationships  . Social connections:    Talks on phone: Not on file    Gets together: Not on file    Attends religious service: Not on file    Active member of club or organization: Not on file    Attends meetings of clubs or organizations: Not on file    Relationship status: Not on file  . Intimate partner violence:    Fear of current or ex partner: Not on file    Emotionally abused: Not on file    Physically abused: Not on file    Forced sexual activity: Not on file  Other Topics Concern  . Not on file  Social History Narrative   Divorced December 22, 2007, dating as of 21-Dec-2017   His daughter died 4 days after giving birth to the patient's granddaughter   Has joint custody of his dead daughter's child   Works at [employer]  is [place]    FAMILY HISTORY: Family History  Problem Relation Age of Onset  . Hypertension Mother   . Diabetes Mother   . Heart disease Mother        CAD  . Dementia Mother   . Hypertension Father   . Heart disease Father        MI 02/03  . Colon cancer Father   . Hypertension Sister   . Hypertension Sister   . Hypertension Sister   . Prostate cancer Neg Hx     ALLERGIES:  is allergic to tadalafil.  MEDICATIONS:  Current Outpatient Medications  Medication Sig Dispense Refill  . acetaminophen (TYLENOL) 325 MG tablet Take 650 mg by mouth every 6 (six) hours as needed.    Marland Kitchen acyclovir (ZOVIRAX) 400 MG tablet Take 1 tablet (400 mg total) by mouth 2 (two) times daily. 60 tablet 3  . amLODipine (NORVASC) 5 MG tablet Take 1-2 tablets (5-10 mg total) by mouth daily. (Patient taking differently: Take 10 mg by mouth daily. ) 180 tablet 3  . co-enzyme Q-10 50 MG capsule Take 50 mg by mouth daily.    . [employer] VITAMIN B12 1000 MCG tablet TAKE 1 TABLET BY MOUTH EVERY DAY 90 tablet 1  . dexamethasone (DECADRON) 4 MG tablet Take 10 tablets (40 mg total) by mouth once a week. 30 tablet 0  . lenalidomide  (REVLIMID) 25 MG capsule TAKE 1 CAPSULE BY MOUTH DAILY FOR 14 DAYS, THEN HOLD FOR 7 DAYS. REPEAT EVERY 21 DAYS. 14 capsule 0  . metoprolol succinate (TOPROL-XL) 50 MG 24 hr tablet Take 2-3 tablets (100-150 mg total) by mouth daily. (Patient taking differently: Take 150 mg by mouth daily. ) 270 tablet 3   No current facility-administered medications for this visit.      PHYSICAL EXAMINATION: ECOG PERFORMANCE STATUS: 0 - Asymptomatic Vitals:   12/16/18 0854  BP: (!) 168/93  Pulse: 78  Temp: (!) 97.2 F (36.2 C)   Filed Weights   12/16/18 0854  Weight: 212 lb (96.2 kg)    Physical Exam Constitutional:      General: He is not in acute distress. HENT:     Head: Normocephalic and atraumatic.  Eyes:     General: No scleral icterus.    Pupils: Pupils are equal, round, and reactive to light.  Neck:     Musculoskeletal: Normal range of motion and neck supple.  Cardiovascular:     Rate and Rhythm: Normal rate and regular rhythm.     Heart sounds: Normal heart sounds.  Pulmonary:     Effort: Pulmonary effort is normal. No respiratory distress.     Breath sounds: No wheezing.  Abdominal:     General: Bowel sounds are normal. There is no distension.     Palpations: Abdomen is soft. There is no mass.     Tenderness: There is no abdominal tenderness.  Musculoskeletal: Normal range of motion.        General: No deformity.     Comments: Trace edema at ankle level bilaterally.  Skin:    General: Skin is warm and dry.     Findings: No erythema or rash.  Neurological:     Mental Status: He is alert and oriented to person, place, and time.     Cranial Nerves: No cranial nerve deficit.     Coordination: Coordination normal.  Psychiatric:        Behavior: Behavior normal.        Thought Content: Thought  content normal.      LABORATORY DATA:  I have reviewed the data as listed Lab Results  Component Value Date   WBC 5.6 12/16/2018   HGB 11.1 (L) 12/16/2018   HCT 32.8 (L)  12/16/2018   MCV 94.5 12/16/2018   PLT 121 (L) 12/16/2018   Recent Labs    11/24/18 0848 12/08/18 1339 12/16/18 0824  NA 140 134* 140  K 4.4 4.0 4.6  CL 112* 102 110  CO2 25 24 24   GLUCOSE 116* 184* 109*  BUN 22 21 24*  CREATININE 1.27* 1.37* 1.31*  CALCIUM 9.0 8.8* 9.1  GFRNONAA >60 55* 58*  GFRAA >60 >60 >60  PROT 6.8 6.8 7.0  ALBUMIN 4.2 4.1 4.3  AST 18 27 18   ALT 25 39 26  ALKPHOS 79 76 74  BILITOT 0.6 0.6 0.7   Iron/TIBC/Ferritin/ %Sat    Component Value Date/Time   IRON 74 07/01/2018 0944   TIBC 250 07/01/2018 0944   FERRITIN 357 07/01/2018 0944   IRONPCTSAT 30 07/01/2018 0944    Lab Results  Component Value Date   TOTALPROTELP 6.3 11/24/2018   Lab Results  Component Value Date   KPAFRELGTCHN 319.8 (H) 11/24/2018   LAMBDASER 12.2 11/24/2018   KAPLAMBRATIO 26.21 (H) 11/24/2018    RADIOGRAPHIC STUDIES: I have personally reviewed the radiological images as listed and agreed with the findings in the report. 10/23/2018 FINDINGS: The heart size and mediastinal contours are within normal limits. Both lungs are clear. The visualized skeletal structures are unremarkable. IMPRESSION: No active cardiopulmonary disease.   ASSESSMENT & PLAN:  1. Multiple myeloma not having achieved remission ([place])   2. 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 Encounter for antineoplastic chemotherapy   4. Thrombocytopenia (HCC)   Light chain multiple myeloma, beta 2 microglobulin 5 and normal albumin.  Stage II. cytogenetics showed normal male chromosome, MDS FISH panel showed trisomy 16- Standard Risk.  #Multiple myeloma Tolerates 5 cycles of RVD well.  Multiple myeloma panel after 4 cycles of RVD showed light chain ratio decreased to 26. I am still waiting for the official recommendation from [facility] bone marrow transplant team. Per patient, [facility] recommended continue to have another 2 cycles of treatments. We will proceed with cycle 6 RVD. Velcade 1.79m/m2 Day 1, 8, 15 Revlimid  269mdaily Day 1-14. [  Start at Cycle 5 as GFR>60 ]. Dexamethasone [id number] Today he will receive day 1 Velcade.  New dose trans-Revlimid will be delivered to him and he will start Revlimid 25 mg for 14 days. Continue Acyclovir 400 mg twice daily as well as aspirin 81 mg daily.  Thrombocytopenia and anemia secondary to chemotherapy treatment.  Hemoglobin has improved since last visit.  Thrombocytopenia stable.  Continue to monitor.  RTC: Proceed with day 8 Velcade on 12/23/2018 .  Lab encounter and proceed with day 15 of Velcade on 12/30/2018. Follow-up 3 weeks repeat multiple myeloma panel at that time.   ZhoEarlie ServerD, PhD Hematology Oncology [facility]- [id number]

## 2018-12-16 NOTE — Progress Notes (Signed)
Patient here today for follow up and velcade.  Patient c/o bilateral swelling in legs/ankles/feet

## 2018-12-17 LAB — KAPPA/LAMBDA LIGHT CHAINS
KAPPA, LAMDA LIGHT CHAIN RATIO: 12.51 — AB (ref 0.26–1.65)
Kappa free light chain: 197.7 mg/L — ABNORMAL HIGH (ref 3.3–19.4)
Lambda free light chains: 15.8 mg/L (ref 5.7–26.3)

## 2018-12-18 LAB — MULTIPLE MYELOMA PANEL, SERUM
Albumin SerPl Elph-Mcnc: 4.2 g/dL (ref 2.9–4.4)
Albumin/Glob SerPl: 1.7 (ref 0.7–1.7)
Alpha 1: 0.2 g/dL (ref 0.0–0.4)
Alpha2 Glob SerPl Elph-Mcnc: 0.7 g/dL (ref 0.4–1.0)
B-Globulin SerPl Elph-Mcnc: 1.1 g/dL (ref 0.7–1.3)
Gamma Glob SerPl Elph-Mcnc: 0.5 g/dL (ref 0.4–1.8)
Globulin, Total: 2.5 g/dL (ref 2.2–3.9)
IgA: 189 mg/dL (ref 61–437)
IgG (Immunoglobin G), Serum: 684 mg/dL — ABNORMAL LOW (ref 700–1600)
IgM (Immunoglobulin M), Srm: 19 mg/dL — ABNORMAL LOW (ref 20–172)
TOTAL PROTEIN ELP: 6.7 g/dL (ref 6.0–8.5)

## 2018-12-22 ENCOUNTER — Telehealth: Payer: Self-pay | Admitting: *Deleted

## 2018-12-22 ENCOUNTER — Ambulatory Visit
Admission: RE | Admit: 2018-12-22 | Discharge: 2018-12-22 | Disposition: A | Discharge status: Home / Self Care | Payer: BLUE CROSS/BLUE SHIELD | Source: Ambulatory Visit | Attending: Oncology | Admitting: Oncology

## 2018-12-22 ENCOUNTER — Other Ambulatory Visit: Payer: Self-pay | Admitting: Oncology

## 2018-12-22 DIAGNOSIS — C9000 Multiple myeloma not having achieved remission: Secondary | ICD-10-CM | POA: Diagnosis not present

## 2018-12-22 DIAGNOSIS — M7989 Other specified soft tissue disorders: Secondary | ICD-10-CM | POA: Insufficient documentation

## 2018-12-22 DIAGNOSIS — R600 Localized edema: Secondary | ICD-10-CM | POA: Diagnosis not present

## 2018-12-22 NOTE — Telephone Encounter (Signed)
Please schedule patient to have bilateral lower extremity US duplex. Thanks. STAT

## 2018-12-22 NOTE — Telephone Encounter (Signed)
Called asking for something to bilateral swelling in legs, feet and ankles. Please advise

## 2018-12-23 ENCOUNTER — Other Ambulatory Visit: Payer: Self-pay

## 2018-12-23 ENCOUNTER — Inpatient Hospital Stay: Payer: BLUE CROSS/BLUE SHIELD

## 2018-12-23 ENCOUNTER — Other Ambulatory Visit: Payer: Self-pay | Admitting: Oncology

## 2018-12-23 VITALS — BP 162/93 | HR 88

## 2018-12-23 DIAGNOSIS — Z79899 Other long term (current) drug therapy: Secondary | ICD-10-CM | POA: Diagnosis not present

## 2018-12-23 DIAGNOSIS — G629 Polyneuropathy, unspecified: Secondary | ICD-10-CM | POA: Diagnosis not present

## 2018-12-23 DIAGNOSIS — C9000 Multiple myeloma not having achieved remission: Secondary | ICD-10-CM

## 2018-12-23 DIAGNOSIS — Z7982 Long term (current) use of aspirin: Secondary | ICD-10-CM | POA: Diagnosis not present

## 2018-12-23 DIAGNOSIS — N179 Acute kidney failure, unspecified: Secondary | ICD-10-CM | POA: Diagnosis not present

## 2018-12-23 DIAGNOSIS — R600 Localized edema: Secondary | ICD-10-CM | POA: Diagnosis not present

## 2018-12-23 DIAGNOSIS — Z5112 Encounter for antineoplastic immunotherapy: Secondary | ICD-10-CM | POA: Diagnosis not present

## 2018-12-23 DIAGNOSIS — D6959 Other secondary thrombocytopenia: Secondary | ICD-10-CM | POA: Diagnosis not present

## 2018-12-23 DIAGNOSIS — H00039 Abscess of eyelid unspecified eye, unspecified eyelid: Secondary | ICD-10-CM | POA: Diagnosis not present

## 2018-12-23 DIAGNOSIS — D6481 Anemia due to antineoplastic chemotherapy: Secondary | ICD-10-CM | POA: Diagnosis not present

## 2018-12-23 DIAGNOSIS — M7989 Other specified soft tissue disorders: Secondary | ICD-10-CM

## 2018-12-23 MED ORDER — BORTEZOMIB CHEMO SQ INJECTION 3.5 MG (2.5MG/ML)
1.3000 mg/m2 | Freq: Once | INTRAMUSCULAR | Status: AC
  Administered 2018-12-23: 2.75 mg via SUBCUTANEOUS
  Filled 2018-12-23: qty 1.1

## 2018-12-23 MED ORDER — PROCHLORPERAZINE MALEATE 10 MG PO TABS
10.0000 mg | ORAL_TABLET | Freq: Once | ORAL | Status: AC
  Administered 2018-12-23: 10 mg via ORAL
  Filled 2018-12-23: qty 1

## 2018-12-30 ENCOUNTER — Inpatient Hospital Stay: Payer: BLUE CROSS/BLUE SHIELD

## 2018-12-30 ENCOUNTER — Other Ambulatory Visit: Payer: Self-pay

## 2018-12-30 VITALS — BP 161/96 | HR 74 | Temp 96.5°F | Resp 18 | Ht 70.0 in | Wt 210.4 lb

## 2018-12-30 DIAGNOSIS — Z5112 Encounter for antineoplastic immunotherapy: Secondary | ICD-10-CM | POA: Diagnosis not present

## 2018-12-30 DIAGNOSIS — R600 Localized edema: Secondary | ICD-10-CM | POA: Diagnosis not present

## 2018-12-30 DIAGNOSIS — Z7982 Long term (current) use of aspirin: Secondary | ICD-10-CM | POA: Diagnosis not present

## 2018-12-30 DIAGNOSIS — D6481 Anemia due to antineoplastic chemotherapy: Secondary | ICD-10-CM | POA: Diagnosis not present

## 2018-12-30 DIAGNOSIS — C9000 Multiple myeloma not having achieved remission: Secondary | ICD-10-CM | POA: Diagnosis not present

## 2018-12-30 DIAGNOSIS — G629 Polyneuropathy, unspecified: Secondary | ICD-10-CM | POA: Diagnosis not present

## 2018-12-30 DIAGNOSIS — H00039 Abscess of eyelid unspecified eye, unspecified eyelid: Secondary | ICD-10-CM | POA: Diagnosis not present

## 2018-12-30 DIAGNOSIS — D6959 Other secondary thrombocytopenia: Secondary | ICD-10-CM | POA: Diagnosis not present

## 2018-12-30 DIAGNOSIS — Z79899 Other long term (current) drug therapy: Secondary | ICD-10-CM | POA: Diagnosis not present

## 2018-12-30 DIAGNOSIS — N179 Acute kidney failure, unspecified: Secondary | ICD-10-CM | POA: Diagnosis not present

## 2018-12-30 LAB — COMPREHENSIVE METABOLIC PANEL
ALT: 29 U/L (ref 0–44)
AST: 21 U/L (ref 15–41)
Albumin: 4.1 g/dL (ref 3.5–5.0)
Alkaline Phosphatase: 63 U/L (ref 38–126)
Anion gap: 7 (ref 5–15)
BUN: 20 mg/dL (ref 8–23)
CO2: 25 mmol/L (ref 22–32)
Calcium: 9 mg/dL (ref 8.9–10.3)
Chloride: 105 mmol/L (ref 98–111)
Creatinine, Ser: 1.34 mg/dL — ABNORMAL HIGH (ref 0.61–1.24)
GFR calc Af Amer: >60 mL/min (ref >60)
GFR calc non Af Amer: 56 mL/min — ABNORMAL LOW (ref >60)
Glucose, Bld: 121 mg/dL — ABNORMAL HIGH (ref 70–99)
Potassium: 3.8 mmol/L (ref 3.5–5.1)
SODIUM: 137 mmol/L (ref 135–145)
Total Bilirubin: 0.6 mg/dL (ref 0.3–1.2)
Total Protein: 6.7 g/dL (ref 6.5–8.1)

## 2018-12-30 LAB — CBC WITH DIFFERENTIAL/PLATELET
Abs Immature Granulocytes: 0.02 10*3/uL (ref 0.00–0.07)
Basophils Absolute: 0 10*3/uL (ref 0.0–0.1)
Basophils Relative: 1 %
Eosinophils Absolute: 0.1 10*3/uL (ref 0.0–0.5)
Eosinophils Relative: 2 %
HCT: 33 % — ABNORMAL LOW (ref 39.0–52.0)
Hemoglobin: 11.4 g/dL — ABNORMAL LOW (ref 13.0–17.0)
Immature Granulocytes: 0 %
Lymphocytes Relative: 12 %
Lymphs Abs: 0.7 10*3/uL (ref 0.7–4.0)
MCH: 31.6 pg (ref 26.0–34.0)
MCHC: 34.5 g/dL (ref 30.0–36.0)
MCV: 91.4 fL (ref 80.0–100.0)
Monocytes Absolute: 0.4 10*3/uL (ref 0.1–1.0)
Monocytes Relative: 8 %
Neutro Abs: 4.2 10*3/uL (ref 1.7–7.7)
Neutrophils Relative %: 77 %
Platelets: 119 10*3/uL — ABNORMAL LOW (ref 150–400)
RBC: 3.61 MIL/uL — ABNORMAL LOW (ref 4.22–5.81)
RDW: 15.1 % (ref 11.5–15.5)
WBC: 5.5 10*3/uL (ref 4.0–10.5)
nRBC: 0 % (ref 0.0–0.2)

## 2018-12-30 MED ORDER — PROCHLORPERAZINE MALEATE 10 MG PO TABS
10.0000 mg | ORAL_TABLET | Freq: Once | ORAL | Status: AC
  Administered 2018-12-30: 10 mg via ORAL
  Filled 2018-12-30: qty 1

## 2018-12-30 MED ORDER — BORTEZOMIB CHEMO SQ INJECTION 3.5 MG (2.5MG/ML)
1.3000 mg/m2 | Freq: Once | INTRAMUSCULAR | Status: AC
  Administered 2018-12-30: 2.75 mg via SUBCUTANEOUS
  Filled 2018-12-30: qty 1.1

## 2018-12-31 ENCOUNTER — Other Ambulatory Visit: Payer: Self-pay | Admitting: *Deleted

## 2018-12-31 NOTE — Telephone Encounter (Signed)
Called Dr Maylene Roes office again, as OV note from 11/04/18 still has not been signed.  Nursing staff states that he still has not signed his note.  Requested for dr Maylene Roes to call dr Tasia Catchings, provided dr Tasia Catchings cell phone number.

## 2019-01-01 MED ORDER — LENALIDOMIDE 25 MG PO CAPS: ORAL_CAPSULE | ORAL | 0 refills | Status: DC

## 2019-01-02 ENCOUNTER — Telehealth: Payer: Self-pay | Admitting: Oncology

## 2019-01-02 DIAGNOSIS — H0016 Chalazion left eye, unspecified eyelid: Secondary | ICD-10-CM | POA: Diagnosis not present

## 2019-01-02 NOTE — Telephone Encounter (Signed)
Patient called that he developed left eye lid swelling and pain, he feels it looks like cellulitis.  I ask patient to go to urgent care for evaluation.

## 2019-01-04 ENCOUNTER — Other Ambulatory Visit: Payer: Self-pay | Admitting: Oncology

## 2019-01-04 DIAGNOSIS — C9000 Multiple myeloma not having achieved remission: Secondary | ICD-10-CM

## 2019-01-05 ENCOUNTER — Other Ambulatory Visit: Payer: Self-pay

## 2019-01-06 ENCOUNTER — Encounter: Payer: Self-pay | Admitting: Oncology

## 2019-01-06 ENCOUNTER — Inpatient Hospital Stay: Payer: BLUE CROSS/BLUE SHIELD

## 2019-01-06 ENCOUNTER — Other Ambulatory Visit: Payer: Self-pay | Admitting: Family Medicine

## 2019-01-06 ENCOUNTER — Inpatient Hospital Stay (HOSPITAL_BASED_OUTPATIENT_CLINIC_OR_DEPARTMENT_OTHER): Payer: BLUE CROSS/BLUE SHIELD | Admitting: Oncology

## 2019-01-06 ENCOUNTER — Other Ambulatory Visit: Payer: Self-pay

## 2019-01-06 ENCOUNTER — Encounter: Payer: Self-pay | Admitting: Family Medicine

## 2019-01-06 VITALS — BP 143/91 | HR 76 | Temp 97.0°F | Wt 209.9 lb

## 2019-01-06 DIAGNOSIS — Z79899 Other long term (current) drug therapy: Secondary | ICD-10-CM

## 2019-01-06 DIAGNOSIS — R600 Localized edema: Secondary | ICD-10-CM

## 2019-01-06 DIAGNOSIS — H00039 Abscess of eyelid unspecified eye, unspecified eyelid: Secondary | ICD-10-CM | POA: Diagnosis not present

## 2019-01-06 DIAGNOSIS — D6959 Other secondary thrombocytopenia: Secondary | ICD-10-CM | POA: Diagnosis not present

## 2019-01-06 DIAGNOSIS — C9000 Multiple myeloma not having achieved remission: Secondary | ICD-10-CM

## 2019-01-06 DIAGNOSIS — D696 Thrombocytopenia, unspecified: Secondary | ICD-10-CM

## 2019-01-06 DIAGNOSIS — D6481 Anemia due to antineoplastic chemotherapy: Secondary | ICD-10-CM

## 2019-01-06 DIAGNOSIS — Z5112 Encounter for antineoplastic immunotherapy: Secondary | ICD-10-CM | POA: Diagnosis not present

## 2019-01-06 DIAGNOSIS — N179 Acute kidney failure, unspecified: Secondary | ICD-10-CM | POA: Diagnosis not present

## 2019-01-06 DIAGNOSIS — D649 Anemia, unspecified: Secondary | ICD-10-CM

## 2019-01-06 DIAGNOSIS — Z7982 Long term (current) use of aspirin: Secondary | ICD-10-CM | POA: Diagnosis not present

## 2019-01-06 DIAGNOSIS — H00036 Abscess of eyelid left eye, unspecified eyelid: Secondary | ICD-10-CM

## 2019-01-06 DIAGNOSIS — G629 Polyneuropathy, unspecified: Secondary | ICD-10-CM

## 2019-01-06 DIAGNOSIS — Z5111 Encounter for antineoplastic chemotherapy: Secondary | ICD-10-CM

## 2019-01-06 HISTORY — DX: Acute kidney failure, unspecified: N17.9

## 2019-01-06 LAB — COMPREHENSIVE METABOLIC PANEL
ALT: 28 U/L (ref 0–44)
AST: 22 U/L (ref 15–41)
Albumin: 3.9 g/dL (ref 3.5–5.0)
Alkaline Phosphatase: 60 U/L (ref 38–126)
Anion gap: 8 (ref 5–15)
BUN: 23 mg/dL (ref 8–23)
CO2: 23 mmol/L (ref 22–32)
Calcium: 8.9 mg/dL (ref 8.9–10.3)
Chloride: 108 mmol/L (ref 98–111)
Creatinine, Ser: 1.64 mg/dL — ABNORMAL HIGH (ref 0.61–1.24)
GFR calc Af Amer: 51 mL/min — ABNORMAL LOW (ref >60)
GFR calc non Af Amer: 44 mL/min — ABNORMAL LOW (ref >60)
Glucose, Bld: 144 mg/dL — ABNORMAL HIGH (ref 70–99)
POTASSIUM: 4.2 mmol/L (ref 3.5–5.1)
Sodium: 139 mmol/L (ref 135–145)
Total Bilirubin: 0.8 mg/dL (ref 0.3–1.2)
Total Protein: 6.5 g/dL (ref 6.5–8.1)

## 2019-01-06 LAB — CBC WITH DIFFERENTIAL/PLATELET
Abs Immature Granulocytes: 0.02 10*3/uL (ref 0.00–0.07)
Basophils Absolute: 0 10*3/uL (ref 0.0–0.1)
Basophils Relative: 1 %
Eosinophils Absolute: 0.1 10*3/uL (ref 0.0–0.5)
Eosinophils Relative: 2 %
HCT: 32.6 % — ABNORMAL LOW (ref 39.0–52.0)
Hemoglobin: 11 g/dL — ABNORMAL LOW (ref 13.0–17.0)
Immature Granulocytes: 0 %
Lymphocytes Relative: 19 %
Lymphs Abs: 1.1 10*3/uL (ref 0.7–4.0)
MCH: 31.3 pg (ref 26.0–34.0)
MCHC: 33.7 g/dL (ref 30.0–36.0)
MCV: 92.6 fL (ref 80.0–100.0)
Monocytes Absolute: 0.7 10*3/uL (ref 0.1–1.0)
Monocytes Relative: 11 %
NRBC: 0 % (ref 0.0–0.2)
Neutro Abs: 4 10*3/uL (ref 1.7–7.7)
Neutrophils Relative %: 67 %
Platelets: 109 10*3/uL — ABNORMAL LOW (ref 150–400)
RBC: 3.52 MIL/uL — AB (ref 4.22–5.81)
RDW: 15.6 % — AB (ref 11.5–15.5)
WBC: 6 10*3/uL (ref 4.0–10.5)

## 2019-01-06 MED ORDER — PROCHLORPERAZINE MALEATE 10 MG PO TABS
10.0000 mg | ORAL_TABLET | Freq: Once | ORAL | Status: AC
  Administered 2019-01-06: 10 mg via ORAL
  Filled 2019-01-06: qty 1

## 2019-01-06 MED ORDER — SODIUM CHLORIDE 0.9 % IV SOLN
Freq: Once | INTRAVENOUS | Status: AC
  Administered 2019-01-06: 11:00:00 via INTRAVENOUS
  Filled 2019-01-06: qty 250

## 2019-01-06 MED ORDER — BORTEZOMIB CHEMO SQ INJECTION 3.5 MG (2.5MG/ML)
1.3000 mg/m2 | Freq: Once | INTRAMUSCULAR | Status: AC
  Administered 2019-01-06: 2.75 mg via SUBCUTANEOUS
  Filled 2019-01-06: qty 1.1

## 2019-01-06 NOTE — Progress Notes (Signed)
Hematology/Oncology  Follow up note [facility] [phone] [phone]   Patient Care Team: Tonia Ghent, MD as PCP - General (Family Medicine)  REFERRING PROVIDER: Napoleon Form REASON FOR VISIT Follow up for bone marrow results/PET scan and discussion of diagnosis of multiple myeloma.   HISTORY OF PRESENTING ILLNESS:  Luis Burke is a  63 y.o.  male with PMH listed below who was referred to me for evaluation of anemia and thrombocytopenia Associated signs and symptoms: Patient reports feeling fatigue.  Denies SOB with exertion.  Denies weight loss, easy bruising, hematochezia, hemoptysis, hematuria. Context: History of GI bleeding: Denies               History of Chronic kidney disease yes               History of autoimmune disease denies denies               History of hemolytic anemia.                Last colonoscopy: 07/20/2018 upper and lower endoscopy showed esophagitis, normal examined duodenum, 2 polyps removed in the ascending colon, resected and retrieved.  Otherwise normal. He drinks alcohol, usually a few beers during the weekends.  Denies any smoking history.   Intended weight loss 3 pounds for the past 3 months.  # 07/27/2018 multiple myeloma panel showed M protein of 0.1, IgG 662, IgA 49, IgM 7. Patient was called back to further labs done. 08/10/2018, free light chain ratio showed extremely high level of kappa free light chain 10,183, with a kappa lambda light chain ratio of 1414.31 LDH 164 Beta-2 microglobulin 5 Patient was called and discuss about results.  He was recommended to undergo bone marrow biopsy and PET scan. 08/19/2018 bone marrow biopsy showed hypercellular marrow 80%, involved by plasma cell neoplasm up to 95%.  Consistent with plasma cell myeloma.  FISH and cytogenetics are pending.  09/10/2018 skeletal survey showed questionable small lucent lesions within the midshaft of the humerus bilaterally.   Otherwise no suspicious focal lytic lesion or acute bone abnormality. 08/25/2018 PET scan showed no definite hypermetabolic bone disease but CT findings are highly suspicious for numerous small myelomatous lesions involving the spine, sternum and scattered ribs.   INTERVAL HISTORY Luis Burke is a 63 y.o. male who has above history reviewed by me today for a follow-up visit for management of multiple myeloma. Patient has establish care with [facility] Dr. Maylene Roes on 11/04/2018. [facility] recommended patient to continue on additional cycles of RVD as her light chain ratio and involved light chain has not decreased optimizing.  [facility] prefers to have her involved light chain to be in the level of 40 to 50s.  Patient called 4 days ago reporting that he has developed left eyelid swelling and pain.  I recommended patient to go to urgent care and was diagnosed with eye lid cellulitis.  He was prescribed Bactrim DS and Augmentin, and erythromycin ointment topical.  Reports that his eye lid swelling/erythema has improved significantly.   Denies any persistent numbness and tingling.  Denies fever or chills.  Denies any bone pain.     Review of Systems  Constitutional: Negative for appetite change, chills, fatigue, fever and unexpected weight change.  HENT:   Negative for hearing loss and voice change.   Eyes: Negative for eye problems and icterus.       Left eye lid swelling  Respiratory: Negative for chest tightness, cough and  shortness of breath.   Cardiovascular: Negative for chest pain.  Gastrointestinal: Negative for abdominal distention and abdominal pain.  Endocrine: Negative for hot flashes.  Genitourinary: Negative for difficulty urinating, dysuria and frequency.   Musculoskeletal: Negative for arthralgias.       Ankle swelling  Skin: Negative for itching and rash.  Neurological: Negative for light-headedness and numbness.  Hematological: Negative for adenopathy. Does not bruise/bleed easily.   Psychiatric/Behavioral: Negative for confusion.    MEDICAL HISTORY:  Past Medical History:  Diagnosis Date  . AKI (acute kidney injury) ([place]) 01/06/2019  . Anemia   . Fatty liver    on u/s 07/2018  . Hyperlipidemia 03/2004  . Hypertension 1994  . Snores     SURGICAL HISTORY: Past Surgical History:  Procedure Laterality Date  . CATARACT EXTRACTION W/PHACO Right 03/04/2018   Procedure: CATARACT EXTRACTION PHACO AND INTRAOCULAR LENS PLACEMENT ([place])  RIGHT TORIC;  Surgeon: Leandrew Koyanagi, MD;  Location: [place];  Service: Ophthalmology;  Laterality: Right;  Per [place] no Toric Lens 1:45 5.3  . COLONOSCOPY WITH PROPOFOL N/A 07/20/2018   Procedure: COLONOSCOPY WITH PROPOFOL;  Surgeon: Jonathon Bellows, MD;  Location: [facility] ENDOSCOPY;  Service: Gastroenterology;  Laterality: N/A;  . ESOPHAGOGASTRODUODENOSCOPY (EGD) WITH PROPOFOL N/A 07/20/2018   Procedure: ESOPHAGOGASTRODUODENOSCOPY (EGD) WITH PROPOFOL;  Surgeon: Jonathon Bellows, MD;  Location: [facility] ENDOSCOPY;  Service: Gastroenterology;  Laterality: N/A;  . EYE SURGERY Right   . HERNIA REPAIR    . L Lap herniorraphy  04/2000  . Left wrist ganglionectomy      SOCIAL HISTORY: Social History   Socioeconomic History  . Marital status: Single    Spouse name: Not on file  . Number of children: 1  . Years of education: Not on file  . Highest education level: Not on file  Occupational History  . Occupation: [employer]  Social Needs  . Financial resource strain: Not on file  . Food insecurity:    Worry: Not on file    Inability: Not on file  . Transportation needs:    Medical: Not on file    Non-medical: Not on file  Tobacco Use  . Smoking status: Never Smoker  . Smokeless tobacco: Never Used  . Tobacco comment: Occassionally  Substance and Sexual Activity  . Alcohol use: Not Currently    Alcohol/week: 3.0 standard drinks    Types: 3 Cans of beer per week  . Drug use: No  . Sexual activity: Not on file  Lifestyle  .  Physical activity:    Days per week: Not on file    Minutes per session: Not on file  . Stress: Not on file  Relationships  . Social connections:    Talks on phone: Not on file    Gets together: Not on file    Attends religious service: Not on file    Active member of club or organization: Not on file    Attends meetings of clubs or organizations: Not on file    Relationship status: Not on file  . Intimate partner violence:    Fear of current or ex partner: Not on file    Emotionally abused: Not on file    Physically abused: Not on file    Forced sexual activity: Not on file  Other Topics Concern  . Not on file  Social History Narrative   Divorced December 13, 2007, dating as of Dec 12, 2017   His daughter died 4 days after giving birth to the patient's granddaughter   Has  joint custody of his dead daughter's child   Works at [employer] is [employer]    FAMILY HISTORY: Family History  Problem Relation Age of Onset  . Hypertension Mother   . Diabetes Mother   . Heart disease Mother        CAD  . Dementia Mother   . Hypertension Father   . Heart disease Father        MI 02/03  . Colon cancer Father   . Hypertension Sister   . Hypertension Sister   . Hypertension Sister   . Prostate cancer Neg Hx     ALLERGIES:  is allergic to tadalafil.  MEDICATIONS:  Current Outpatient Medications  Medication Sig Dispense Refill  . acetaminophen (TYLENOL) 325 MG tablet Take 650 mg by mouth every 6 (six) hours as needed.    Marland Kitchen acyclovir (ZOVIRAX) 400 MG tablet Take 1 tablet (400 mg total) by mouth 2 (two) times daily. 60 tablet 3  . amLODipine (NORVASC) 5 MG tablet Take 1-2 tablets (5-10 mg total) by mouth daily. (Patient taking differently: Take 10 mg by mouth daily. ) 180 tablet 3  . amoxicillin-clavulanate (AUGMENTIN) 500-125 MG tablet Take 1 tablet by mouth 2 (two) times daily.    Marland Kitchen co-enzyme Q-10 50 MG capsule Take 50 mg by mouth daily.    . [employer] VITAMIN B12 1000 MCG tablet TAKE 1 TABLET BY  MOUTH EVERY DAY 90 tablet 1  . dexamethasone (DECADRON) 4 MG tablet Take 10 tablets (40 mg total) by mouth once a week. 30 tablet 0  . erythromycin ophthalmic ointment Place 1 application into the left eye at bedtime.    Marland Kitchen lenalidomide (REVLIMID) 25 MG capsule TAKE 1 CAPSULE BY MOUTH DAILY FOR 14 DAYS, THEN HOLD FOR 7 DAYS. REPEAT EVERY 21 DAYS. 14 capsule 0  . metoprolol succinate (TOPROL-XL) 50 MG 24 hr tablet Take 2-3 tablets (100-150 mg total) by mouth daily. (Patient taking differently: Take 150 mg by mouth daily. ) 270 tablet 3  . sulfamethoxazole-trimethoprim (BACTRIM DS) 800-160 MG tablet Take 1 tablet by mouth 2 (two) times daily. For 7 days     No current facility-administered medications for this visit.      PHYSICAL EXAMINATION: ECOG PERFORMANCE STATUS: 0 - Asymptomatic Vitals:   01/06/19 1003  BP: (!) 143/91  Pulse: 76  Temp: (!) 97 F (36.1 C)   Filed Weights   01/06/19 1003  Weight: 209 lb 14.4 oz (95.2 kg)    Physical Exam Constitutional:      General: He is not in acute distress. HENT:     Head: Normocephalic and atraumatic.  Eyes:     General: No scleral icterus.    Pupils: Pupils are equal, round, and reactive to light.  Neck:     Musculoskeletal: Normal range of motion and neck supple.  Cardiovascular:     Rate and Rhythm: Normal rate and regular rhythm.     Heart sounds: Normal heart sounds.  Pulmonary:     Effort: Pulmonary effort is normal. No respiratory distress.     Breath sounds: No wheezing.  Abdominal:     General: Bowel sounds are normal. There is no distension.     Palpations: Abdomen is soft. There is no mass.     Tenderness: There is no abdominal tenderness.  Musculoskeletal: Normal range of motion.        General: No deformity.     Comments: Trace edema at ankle level bilaterally.  Skin:    General:  Skin is warm and dry.     Findings: No erythema or rash.  Neurological:     Mental Status: He is alert and oriented to person, place,  and time.     Cranial Nerves: No cranial nerve deficit.     Coordination: Coordination normal.  Psychiatric:        Behavior: Behavior normal.        Thought Content: Thought content normal.      LABORATORY DATA:  I have reviewed the data as listed Lab Results  Component Value Date   WBC 6.0 01/06/2019   HGB 11.0 (L) 01/06/2019   HCT 32.6 (L) 01/06/2019   MCV 92.6 01/06/2019   PLT 109 (L) 01/06/2019   Recent Labs    12/16/18 0824 12/30/18 1331 01/06/19 0935  NA 140 137 139  K 4.6 3.8 4.2  CL 110 105 108  CO2 24 25 23   GLUCOSE 109* 121* 144*  BUN 24* 20 23  CREATININE 1.31* 1.34* 1.64*  CALCIUM 9.1 9.0 8.9  GFRNONAA 58* 56* 44*  GFRAA >60 >60 51*  PROT 7.0 6.7 6.5  ALBUMIN 4.3 4.1 3.9  AST 18 21 22   ALT 26 29 28   ALKPHOS 74 63 60  BILITOT 0.7 0.6 0.8   Iron/TIBC/Ferritin/ %Sat    Component Value Date/Time   IRON 74 07/01/2018 0944   TIBC 250 07/01/2018 0944   FERRITIN 357 07/01/2018 0944   IRONPCTSAT 30 07/01/2018 0944    Lab Results  Component Value Date   TOTALPROTELP 6.7 12/16/2018   Lab Results  Component Value Date   KPAFRELGTCHN 197.7 (H) 12/16/2018   LAMBDASER 15.8 12/16/2018   KAPLAMBRATIO 12.51 (H) 12/16/2018    RADIOGRAPHIC STUDIES: I have personally reviewed the radiological images as listed and agreed with the findings in the report. 10/23/2018 FINDINGS: The heart size and mediastinal contours are within normal limits. Both lungs are clear. The visualized skeletal structures are unremarkable. IMPRESSION: No active cardiopulmonary disease.   ASSESSMENT & PLAN:  1. Multiple myeloma not having achieved remission ([place])   2. AKI (acute kidney injury) ([place])   3. Encounter for antineoplastic chemotherapy   4. Eyelid cellulitis, left   5. Thrombocytopenia ([place])   6. Anemia, unspecified type   Light chain multiple myeloma, beta 2 microglobulin 5 and normal albumin.  Stage II. cytogenetics showed normal male chromosome, MDS FISH panel  showed trisomy 56- Standard Risk. Reviewed recommendation from [facility] Transplant team Dr.Choi who recommended continue additional cycles of RVD to further optimize his light chain levels.  He overall tolerates well. Labs are reviewed and discussed with patient. Proceed with cycle 7 RVD.  Velcade 1.109m/m2 Day 1, 8, 15 Revlimid 255mdaily Day 1-14. [  Start at Cycle 5 as GFR>60 ]. Dexamethasone 4031meekly  Continue Acyclovir 400 mg twice daily as well as aspirin 81 mg daily.  # AKI on CKD. His creatinine increased to 1.64. likely medication induced- Bactrim. Advise patient to stop Bactrim.  Will proceed with 1 liter of IV normal saline. Repeat BMP in 2 days. If no improvement of creatinine/GFR, will reduce revlimid dose.   # Left eye lid cellulitis, improved.  Stop Bactrim. And continue and finish the course of Augmentin and topical Erythromycin.   #Thrombocytopenia and anemia secondary to chemotherapy treatment. Continue to monitor   RTC: Labs on 01/08/2019,  Labs and day 8 Velcade on 12/23/2018 .  Labs and proceed with day 15 of Velcade on 12/30/2018. Follow-up 3 weeks repeat multiple myeloma panel  at that time, and MD assessment for next cycle of treatment.    Earlie Server, MD, PhD Hematology Oncology [facility] Pager- [phone] 01/06/2019

## 2019-01-06 NOTE — Progress Notes (Signed)
Patient here for follow up. Pt had left swollen eyelid over the weekend and went to urgent care. He was prescribed 2 antibiotics and an eye ointment. Left eye is doing much better.

## 2019-01-06 NOTE — Progress Notes (Unsigned)
Med list updated re: bactrim.

## 2019-01-07 ENCOUNTER — Other Ambulatory Visit: Payer: Self-pay

## 2019-01-07 LAB — MULTIPLE MYELOMA PANEL, SERUM
Albumin SerPl Elph-Mcnc: 3.6 g/dL (ref 2.9–4.4)
Albumin/Glob SerPl: 1.7 (ref 0.7–1.7)
Alpha 1: 0.2 g/dL (ref 0.0–0.4)
Alpha2 Glob SerPl Elph-Mcnc: 0.6 g/dL (ref 0.4–1.0)
B-Globulin SerPl Elph-Mcnc: 0.9 g/dL (ref 0.7–1.3)
Gamma Glob SerPl Elph-Mcnc: 0.5 g/dL (ref 0.4–1.8)
Globulin, Total: 2.2 g/dL (ref 2.2–3.9)
IgA: 197 mg/dL (ref 61–437)
IgG (Immunoglobin G), Serum: 620 mg/dL — ABNORMAL LOW (ref 700–1600)
IgM (Immunoglobulin M), Srm: 19 mg/dL — ABNORMAL LOW (ref 20–172)
Total Protein ELP: 5.8 g/dL — ABNORMAL LOW (ref 6.0–8.5)

## 2019-01-07 LAB — KAPPA/LAMBDA LIGHT CHAINS
Kappa free light chain: 117.2 mg/L — ABNORMAL HIGH (ref 3.3–19.4)
Kappa, lambda light chain ratio: 7.19 — ABNORMAL HIGH (ref 0.26–1.65)
Lambda free light chains: 16.3 mg/L (ref 5.7–26.3)

## 2019-01-08 ENCOUNTER — Other Ambulatory Visit: Payer: Self-pay

## 2019-01-08 ENCOUNTER — Ambulatory Visit
Admission: RE | Admit: 2019-01-08 | Discharge: 2019-01-08 | Disposition: A | Discharge status: Home / Self Care | Payer: BLUE CROSS/BLUE SHIELD | Source: Ambulatory Visit | Attending: Oncology | Admitting: Oncology

## 2019-01-08 ENCOUNTER — Inpatient Hospital Stay: Payer: BLUE CROSS/BLUE SHIELD

## 2019-01-08 DIAGNOSIS — N179 Acute kidney failure, unspecified: Secondary | ICD-10-CM | POA: Diagnosis not present

## 2019-01-08 DIAGNOSIS — M7989 Other specified soft tissue disorders: Secondary | ICD-10-CM | POA: Insufficient documentation

## 2019-01-08 DIAGNOSIS — C9000 Multiple myeloma not having achieved remission: Secondary | ICD-10-CM | POA: Diagnosis not present

## 2019-01-08 DIAGNOSIS — I351 Nonrheumatic aortic (valve) insufficiency: Secondary | ICD-10-CM | POA: Diagnosis not present

## 2019-01-08 DIAGNOSIS — G629 Polyneuropathy, unspecified: Secondary | ICD-10-CM | POA: Diagnosis not present

## 2019-01-08 DIAGNOSIS — D649 Anemia, unspecified: Secondary | ICD-10-CM | POA: Insufficient documentation

## 2019-01-08 DIAGNOSIS — D6959 Other secondary thrombocytopenia: Secondary | ICD-10-CM | POA: Diagnosis not present

## 2019-01-08 DIAGNOSIS — Z5112 Encounter for antineoplastic immunotherapy: Secondary | ICD-10-CM | POA: Diagnosis not present

## 2019-01-08 DIAGNOSIS — E785 Hyperlipidemia, unspecified: Secondary | ICD-10-CM | POA: Insufficient documentation

## 2019-01-08 DIAGNOSIS — Z79899 Other long term (current) drug therapy: Secondary | ICD-10-CM | POA: Diagnosis not present

## 2019-01-08 DIAGNOSIS — R600 Localized edema: Secondary | ICD-10-CM | POA: Diagnosis not present

## 2019-01-08 DIAGNOSIS — I10 Essential (primary) hypertension: Secondary | ICD-10-CM | POA: Insufficient documentation

## 2019-01-08 DIAGNOSIS — D6481 Anemia due to antineoplastic chemotherapy: Secondary | ICD-10-CM | POA: Diagnosis not present

## 2019-01-08 DIAGNOSIS — Z7982 Long term (current) use of aspirin: Secondary | ICD-10-CM | POA: Diagnosis not present

## 2019-01-08 DIAGNOSIS — H00039 Abscess of eyelid unspecified eye, unspecified eyelid: Secondary | ICD-10-CM | POA: Diagnosis not present

## 2019-01-08 LAB — BASIC METABOLIC PANEL
Anion gap: 7 (ref 5–15)
BUN: 25 mg/dL — ABNORMAL HIGH (ref 8–23)
CO2: 25 mmol/L (ref 22–32)
CREATININE: 1.5 mg/dL — AB (ref 0.61–1.24)
Calcium: 8.9 mg/dL (ref 8.9–10.3)
Chloride: 106 mmol/L (ref 98–111)
GFR calc Af Amer: 57 mL/min — ABNORMAL LOW (ref >60)
GFR calc non Af Amer: 49 mL/min — ABNORMAL LOW (ref >60)
Glucose, Bld: 94 mg/dL (ref 70–99)
Potassium: 4 mmol/L (ref 3.5–5.1)
Sodium: 138 mmol/L (ref 135–145)

## 2019-01-08 NOTE — Progress Notes (Signed)
*  PRELIMINARY RESULTS* Echocardiogram 2D Echocardiogram has been performed.  Luis Burke 01/08/2019, 10:51 AM

## 2019-01-12 ENCOUNTER — Other Ambulatory Visit: Payer: Self-pay

## 2019-01-13 ENCOUNTER — Inpatient Hospital Stay: Payer: BLUE CROSS/BLUE SHIELD | Attending: Oncology

## 2019-01-13 ENCOUNTER — Other Ambulatory Visit: Payer: Self-pay

## 2019-01-13 ENCOUNTER — Inpatient Hospital Stay: Payer: BLUE CROSS/BLUE SHIELD

## 2019-01-13 VITALS — BP 168/94 | HR 80 | Temp 96.2°F | Resp 18

## 2019-01-13 DIAGNOSIS — Z5112 Encounter for antineoplastic immunotherapy: Secondary | ICD-10-CM | POA: Diagnosis not present

## 2019-01-13 DIAGNOSIS — Z79899 Other long term (current) drug therapy: Secondary | ICD-10-CM | POA: Insufficient documentation

## 2019-01-13 DIAGNOSIS — C9000 Multiple myeloma not having achieved remission: Secondary | ICD-10-CM | POA: Diagnosis not present

## 2019-01-13 DIAGNOSIS — D6481 Anemia due to antineoplastic chemotherapy: Secondary | ICD-10-CM | POA: Insufficient documentation

## 2019-01-13 DIAGNOSIS — D6959 Other secondary thrombocytopenia: Secondary | ICD-10-CM | POA: Diagnosis not present

## 2019-01-13 LAB — COMPREHENSIVE METABOLIC PANEL
ALT: 30 U/L (ref 0–44)
AST: 22 U/L (ref 15–41)
Albumin: 4.5 g/dL (ref 3.5–5.0)
Alkaline Phosphatase: 69 U/L (ref 38–126)
Anion gap: 9 (ref 5–15)
BUN: 17 mg/dL (ref 8–23)
CO2: 26 mmol/L (ref 22–32)
Calcium: 9.3 mg/dL (ref 8.9–10.3)
Chloride: 103 mmol/L (ref 98–111)
Creatinine, Ser: 1.39 mg/dL — ABNORMAL HIGH (ref 0.61–1.24)
GFR calc Af Amer: >60 mL/min (ref >60)
GFR calc non Af Amer: 54 mL/min — ABNORMAL LOW (ref >60)
Glucose, Bld: 117 mg/dL — ABNORMAL HIGH (ref 70–99)
Potassium: 4.1 mmol/L (ref 3.5–5.1)
Sodium: 138 mmol/L (ref 135–145)
Total Bilirubin: 0.7 mg/dL (ref 0.3–1.2)
Total Protein: 7.1 g/dL (ref 6.5–8.1)

## 2019-01-13 LAB — CBC WITH DIFFERENTIAL/PLATELET
Abs Immature Granulocytes: 0.07 10*3/uL (ref 0.00–0.07)
Basophils Absolute: 0 10*3/uL (ref 0.0–0.1)
Basophils Relative: 0 %
Eosinophils Absolute: 0.1 10*3/uL (ref 0.0–0.5)
Eosinophils Relative: 3 %
HCT: 34.5 % — ABNORMAL LOW (ref 39.0–52.0)
Hemoglobin: 11.7 g/dL — ABNORMAL LOW (ref 13.0–17.0)
Immature Granulocytes: 2 %
Lymphocytes Relative: 16 %
Lymphs Abs: 0.7 10*3/uL (ref 0.7–4.0)
MCH: 31.5 pg (ref 26.0–34.0)
MCHC: 33.9 g/dL (ref 30.0–36.0)
MCV: 92.7 fL (ref 80.0–100.0)
Monocytes Absolute: 0.1 10*3/uL (ref 0.1–1.0)
Monocytes Relative: 3 %
Neutro Abs: 3.4 10*3/uL (ref 1.7–7.7)
Neutrophils Relative %: 76 %
Platelets: 123 10*3/uL — ABNORMAL LOW (ref 150–400)
RBC: 3.72 MIL/uL — ABNORMAL LOW (ref 4.22–5.81)
RDW: 15.8 % — ABNORMAL HIGH (ref 11.5–15.5)
WBC: 4.5 10*3/uL (ref 4.0–10.5)
nRBC: 0 % (ref 0.0–0.2)

## 2019-01-13 MED ORDER — PROCHLORPERAZINE MALEATE 10 MG PO TABS
10.0000 mg | ORAL_TABLET | Freq: Once | ORAL | Status: AC
  Administered 2019-01-13: 10 mg via ORAL
  Filled 2019-01-13: qty 1

## 2019-01-13 MED ORDER — BORTEZOMIB CHEMO SQ INJECTION 3.5 MG (2.5MG/ML)
1.3000 mg/m2 | Freq: Once | INTRAMUSCULAR | Status: AC
  Administered 2019-01-13: 14:00:00 2.75 mg via SUBCUTANEOUS
  Filled 2019-01-13: qty 1.1

## 2019-01-15 IMAGING — US VENOUS DOPPLER ULTRASOUND OF  LOWER EXTREMITIES
1 series · 13 of 24 positions shown · non-contrast
Comparison: None.

CLINICAL DATA: Bilateral lower extremity pain and edema for past
week. History of malignancy. Evaluate for DVT.



[Series 1: venous doppler ultrasound of lower extremities · 0.07mm/px · 13 of 59 slices shown]
[im 1/59]
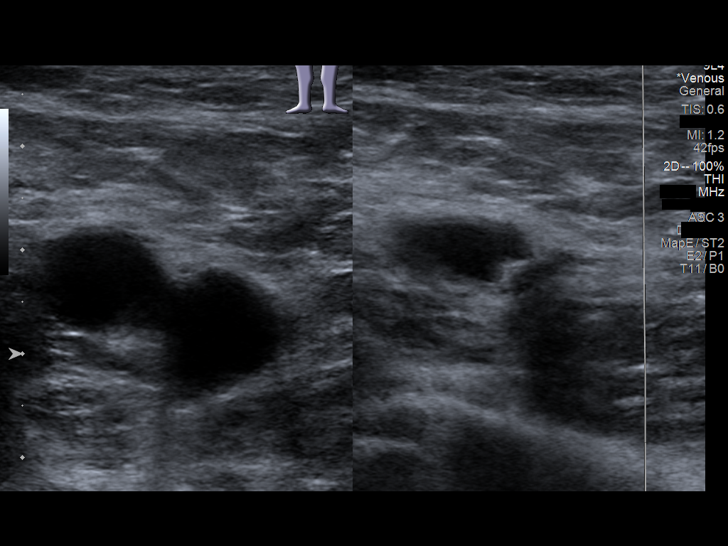
[im 6/59]
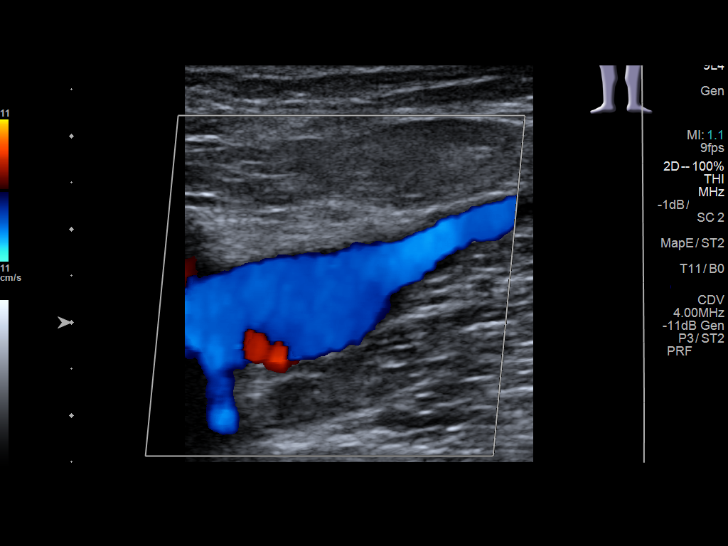
[im 11/59]
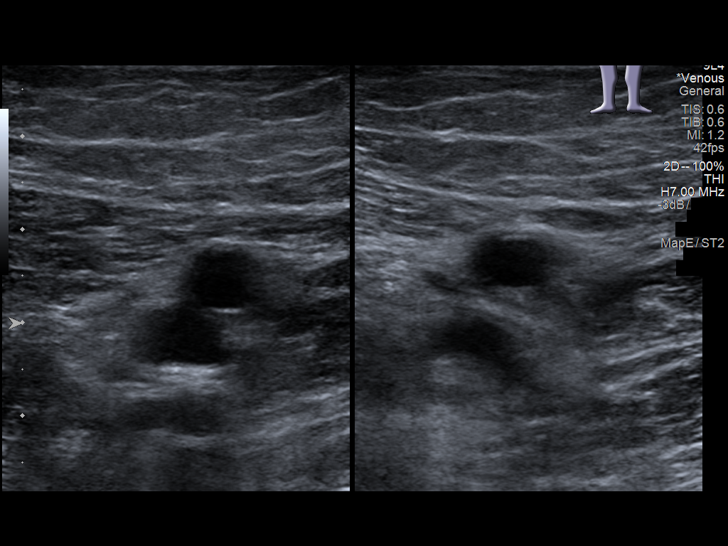
[im 16/59]
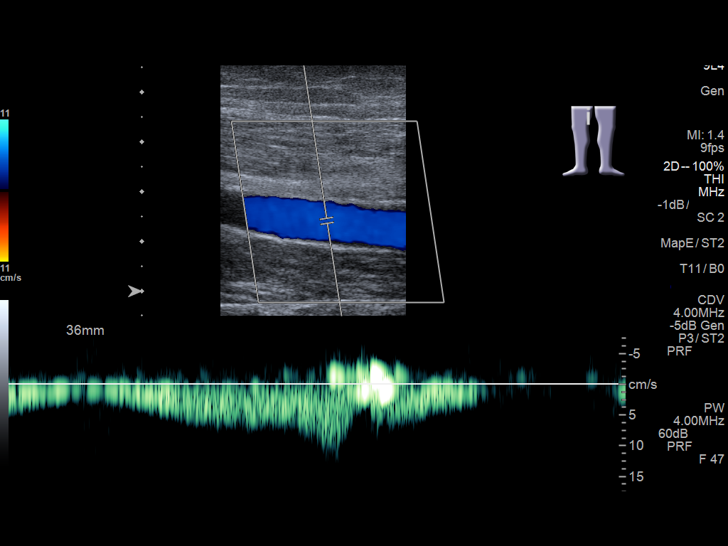
[im 21/59]
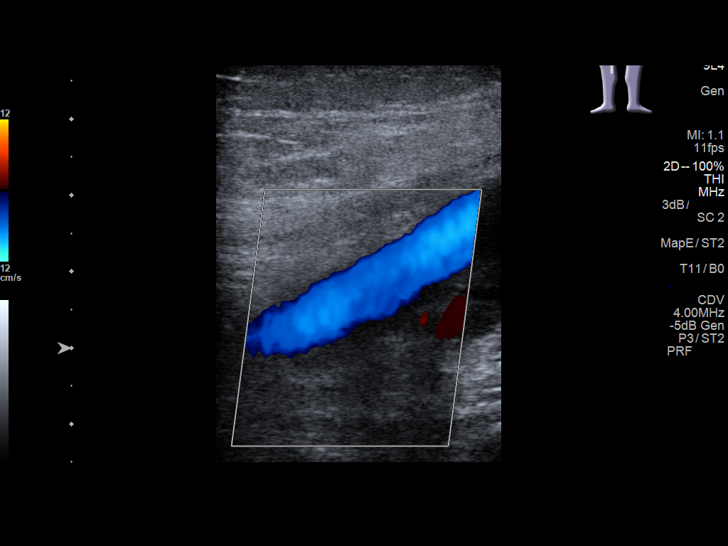
[im 26/59]
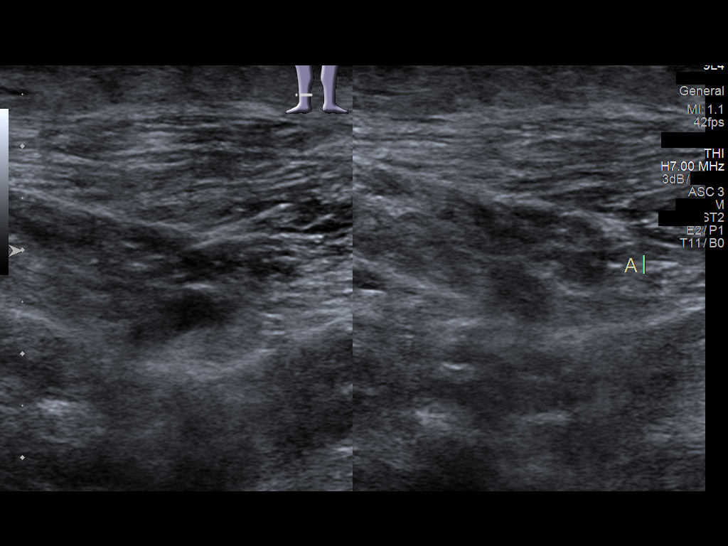
[im 31/59]
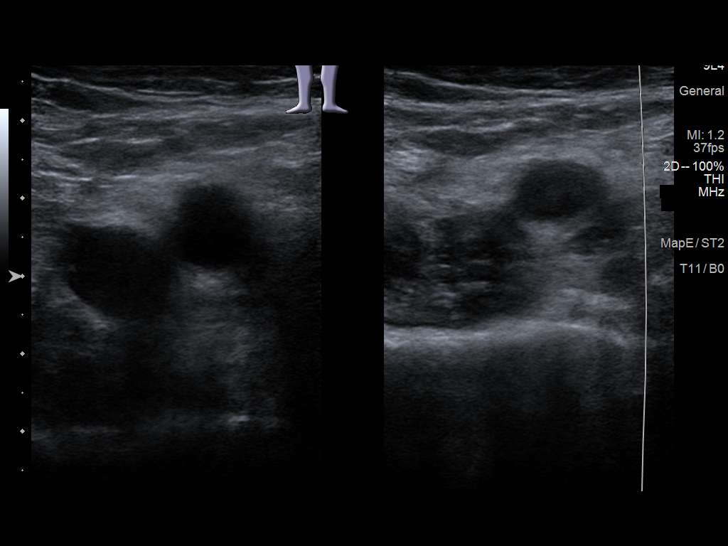
[im 33/59]
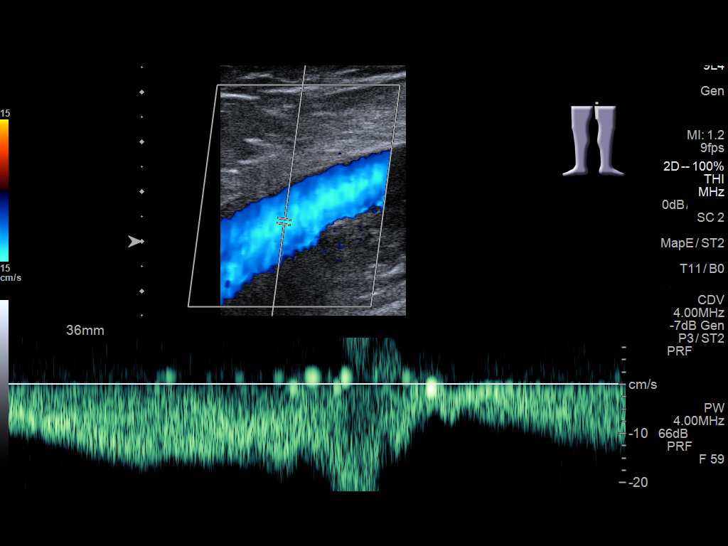
[im 38/59]
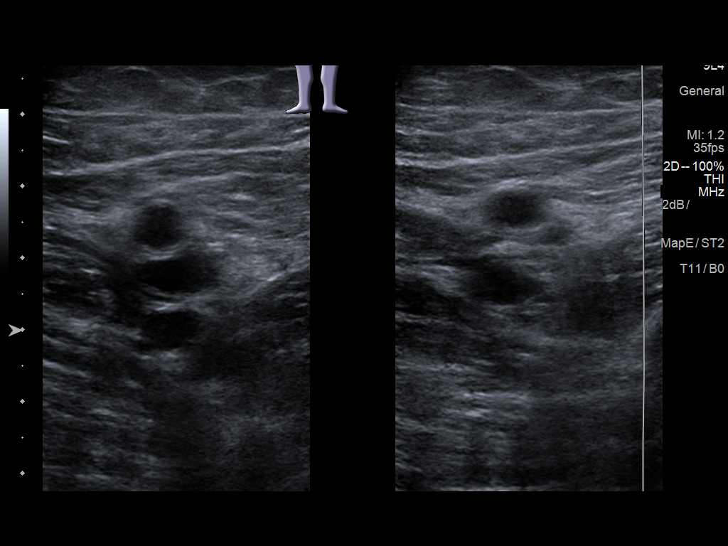
[im 43/59]
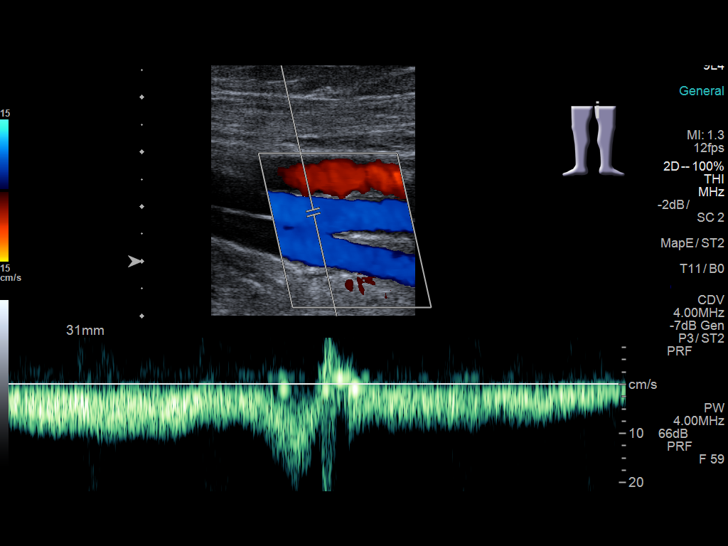
[im 48/59]
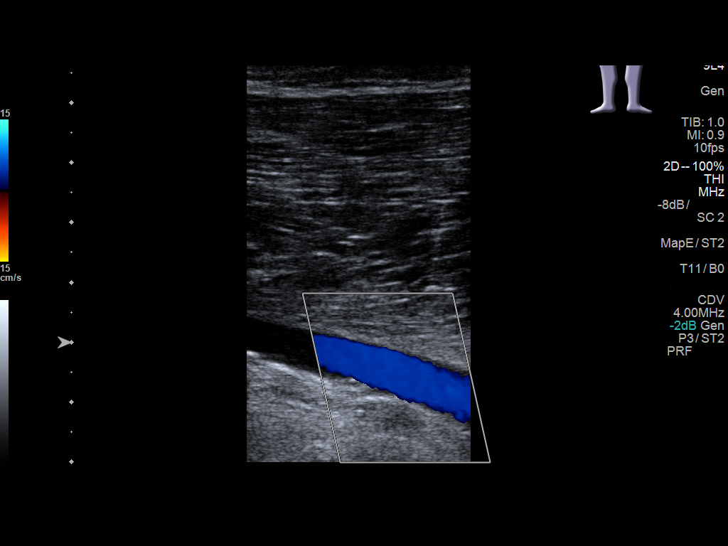
[im 53/59]
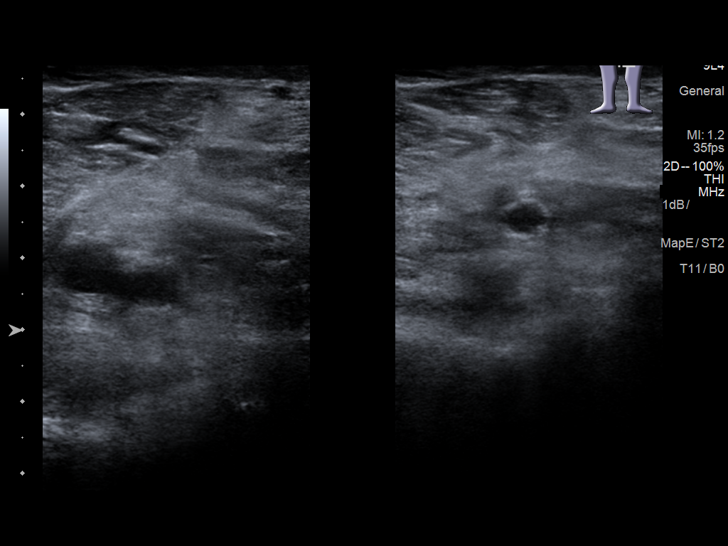
[im 59/59]
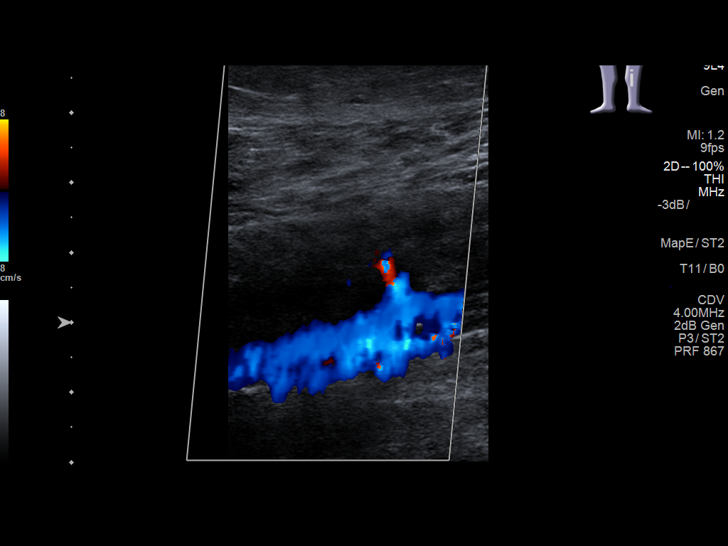

[13 of 24 positions shown; findings below may reference images not displayed]

FINDINGS: RIGHT LOWER EXTREMITY

Common Femoral Vein: No evidence of thrombus. Normal
compressibility, respiratory phasicity and response to augmentation.

Saphenofemoral Junction: No evidence of thrombus. Normal
compressibility and flow on color Doppler imaging.

Profunda Femoral Vein: No evidence of thrombus. Normal
compressibility and flow on color Doppler imaging.

Femoral Vein: No evidence of thrombus. Normal compressibility,
respiratory phasicity and response to augmentation.

Popliteal Vein: No evidence of thrombus. Normal compressibility,
respiratory phasicity and response to augmentation.

Calf Veins: No evidence of thrombus. Normal compressibility and flow
on color Doppler imaging.

Superficial Great Saphenous Vein: No evidence of thrombus. Normal
compressibility.

Venous Reflux:  None.

Other Findings:  None.

LEFT LOWER EXTREMITY

Common Femoral Vein: No evidence of thrombus. Normal
compressibility, respiratory phasicity and response to augmentation.

Saphenofemoral Junction: No evidence of thrombus. Normal
compressibility and flow on color Doppler imaging.

Profunda Femoral Vein: No evidence of thrombus. Normal
compressibility and flow on color Doppler imaging.

Femoral Vein: No evidence of thrombus. Normal compressibility,
respiratory phasicity and response to augmentation.

Popliteal Vein: No evidence of thrombus. Normal compressibility,
respiratory phasicity and response to augmentation.

Calf Veins: No evidence of thrombus. Normal compressibility and flow
on color Doppler imaging.

Superficial Great Saphenous Vein: No evidence of thrombus. Normal
compressibility.

Venous Reflux:  None.

Other Findings:  None.
IMPRESSION: No evidence of DVT within either lower extremity.

## 2019-01-19 ENCOUNTER — Other Ambulatory Visit: Payer: Self-pay

## 2019-01-19 ENCOUNTER — Other Ambulatory Visit: Payer: Self-pay | Admitting: Oncology

## 2019-01-20 ENCOUNTER — Other Ambulatory Visit: Payer: Self-pay

## 2019-01-20 ENCOUNTER — Inpatient Hospital Stay: Payer: BLUE CROSS/BLUE SHIELD

## 2019-01-20 VITALS — BP 136/93 | HR 97 | Temp 99.0°F | Resp 17

## 2019-01-20 DIAGNOSIS — C9000 Multiple myeloma not having achieved remission: Secondary | ICD-10-CM

## 2019-01-20 DIAGNOSIS — D6481 Anemia due to antineoplastic chemotherapy: Secondary | ICD-10-CM | POA: Diagnosis not present

## 2019-01-20 DIAGNOSIS — D6959 Other secondary thrombocytopenia: Secondary | ICD-10-CM | POA: Diagnosis not present

## 2019-01-20 DIAGNOSIS — Z79899 Other long term (current) drug therapy: Secondary | ICD-10-CM | POA: Diagnosis not present

## 2019-01-20 DIAGNOSIS — Z5112 Encounter for antineoplastic immunotherapy: Secondary | ICD-10-CM | POA: Diagnosis not present

## 2019-01-20 LAB — COMPREHENSIVE METABOLIC PANEL
ALT: 31 U/L (ref 0–44)
AST: 25 U/L (ref 15–41)
Albumin: 4.4 g/dL (ref 3.5–5.0)
Alkaline Phosphatase: 62 U/L (ref 38–126)
Anion gap: 9 (ref 5–15)
BUN: 23 mg/dL (ref 8–23)
CO2: 24 mmol/L (ref 22–32)
Calcium: 9.2 mg/dL (ref 8.9–10.3)
Chloride: 106 mmol/L (ref 98–111)
Creatinine, Ser: 1.48 mg/dL — ABNORMAL HIGH (ref 0.61–1.24)
GFR calc Af Amer: 58 mL/min — ABNORMAL LOW (ref >60)
GFR calc non Af Amer: 50 mL/min — ABNORMAL LOW (ref >60)
Glucose, Bld: 192 mg/dL — ABNORMAL HIGH (ref 70–99)
Potassium: 3.7 mmol/L (ref 3.5–5.1)
Sodium: 139 mmol/L (ref 135–145)
Total Bilirubin: 0.8 mg/dL (ref 0.3–1.2)
Total Protein: 7.1 g/dL (ref 6.5–8.1)

## 2019-01-20 LAB — CBC WITH DIFFERENTIAL/PLATELET
Abs Immature Granulocytes: 0.03 10*3/uL (ref 0.00–0.07)
Basophils Absolute: 0 10*3/uL (ref 0.0–0.1)
Basophils Relative: 1 %
Eosinophils Absolute: 0 10*3/uL (ref 0.0–0.5)
Eosinophils Relative: 1 %
HCT: 34 % — ABNORMAL LOW (ref 39.0–52.0)
Hemoglobin: 11.8 g/dL — ABNORMAL LOW (ref 13.0–17.0)
Immature Granulocytes: 1 %
Lymphocytes Relative: 12 %
Lymphs Abs: 0.5 10*3/uL — ABNORMAL LOW (ref 0.7–4.0)
MCH: 31.9 pg (ref 26.0–34.0)
MCHC: 34.7 g/dL (ref 30.0–36.0)
MCV: 91.9 fL (ref 80.0–100.0)
Monocytes Absolute: 0.1 10*3/uL (ref 0.1–1.0)
Monocytes Relative: 2 %
Neutro Abs: 3.7 10*3/uL (ref 1.7–7.7)
Neutrophils Relative %: 83 %
Platelets: 119 10*3/uL — ABNORMAL LOW (ref 150–400)
RBC: 3.7 MIL/uL — ABNORMAL LOW (ref 4.22–5.81)
RDW: 15.6 % — ABNORMAL HIGH (ref 11.5–15.5)
WBC: 4.4 10*3/uL (ref 4.0–10.5)
nRBC: 0 % (ref 0.0–0.2)

## 2019-01-20 MED ORDER — BORTEZOMIB CHEMO SQ INJECTION 3.5 MG (2.5MG/ML)
1.3000 mg/m2 | Freq: Once | INTRAMUSCULAR | Status: AC
  Administered 2019-01-20: 2.75 mg via SUBCUTANEOUS
  Filled 2019-01-20: qty 1.1

## 2019-01-20 MED ORDER — PROCHLORPERAZINE MALEATE 5 MG PO TABS
10.0000 mg | ORAL_TABLET | Freq: Four times a day (QID) | ORAL | Status: DC | PRN
  Administered 2019-01-20: 14:00:00 10 mg via ORAL
  Filled 2019-01-20: qty 2

## 2019-01-20 MED ORDER — PROCHLORPERAZINE MALEATE 10 MG PO TABS: 10.0000 mg | ORAL_TABLET | Freq: Once | ORAL | Status: DC

## 2019-01-26 ENCOUNTER — Other Ambulatory Visit: Payer: Self-pay

## 2019-01-26 ENCOUNTER — Inpatient Hospital Stay (HOSPITAL_BASED_OUTPATIENT_CLINIC_OR_DEPARTMENT_OTHER): Payer: BLUE CROSS/BLUE SHIELD | Admitting: Oncology

## 2019-01-26 ENCOUNTER — Encounter: Payer: Self-pay | Admitting: Oncology

## 2019-01-26 DIAGNOSIS — E785 Hyperlipidemia, unspecified: Secondary | ICD-10-CM

## 2019-01-26 DIAGNOSIS — D696 Thrombocytopenia, unspecified: Secondary | ICD-10-CM | POA: Diagnosis not present

## 2019-01-26 DIAGNOSIS — D649 Anemia, unspecified: Secondary | ICD-10-CM

## 2019-01-26 DIAGNOSIS — Z5111 Encounter for antineoplastic chemotherapy: Secondary | ICD-10-CM

## 2019-01-26 DIAGNOSIS — C9000 Multiple myeloma not having achieved remission: Secondary | ICD-10-CM

## 2019-01-26 DIAGNOSIS — I10 Essential (primary) hypertension: Secondary | ICD-10-CM | POA: Diagnosis not present

## 2019-01-26 DIAGNOSIS — Z9221 Personal history of antineoplastic chemotherapy: Secondary | ICD-10-CM

## 2019-01-26 DIAGNOSIS — Z79899 Other long term (current) drug therapy: Secondary | ICD-10-CM

## 2019-01-26 NOTE — Progress Notes (Signed)
HEMATOLOGY-ONCOLOGY TeleHEALTH VISIT PROGRESS NOTE  I connected with [name] on 01/26/19 at 10:30 AM EDT by video enabled telemedicine visit and verified that I am speaking with the correct person using two identifiers. I discussed the limitations, risks, security and privacy concerns of performing an evaluation and management service by telemedicine and the availability of in-person appointments. I also discussed with the patient that there may be a patient responsible charge related to this service. The patient expressed understanding and agreed to proceed.   Other persons participating in the visit and their role in the encounter:  Geraldine Solar, CMA, check in patient     Patient's location: Home  Provider's location:home  Chief Complaint: Follow-up for evaluation prior to chemotherapy for treatment of multiple myeloma.   INTERVAL HISTORY Luis Burke is a 63 y.o. male who has above history reviewed by me today presents for follow up visit for management for evaluation prior to chemotherapy for multiple myeloma treatment. Problems and complaints are listed below:  Patient has tolerated 7 cycles of RVD. Reports feeling well.  Denies any fever, chills, abdominal pain, chest pain, shortness of breath.  Denies numbness or tingling of fingertips or feet.  No acute bleeding events. Chronic lower extremity ankle level swelling, stable.  Previous work-up negative for thrombosis. Patient is compliant with taking aspirin and acyclovir Left eyelid cellulitis has resolved.  He finished a course of Augmentin.  We have discussed at last visit to stop Bactrim due to AKI.  Review of Systems  Constitutional: Positive for fatigue. Negative for appetite change, chills, fever and unexpected weight change.  HENT:   Negative for hearing loss and voice change.   Eyes: Negative for eye problems and icterus.  Respiratory: Negative for chest tightness, cough and shortness of breath.    Cardiovascular: Negative for chest pain and leg swelling.  Gastrointestinal: Negative for abdominal distention and abdominal pain.  Endocrine: Negative for hot flashes.  Genitourinary: Negative for difficulty urinating, dysuria and frequency.   Musculoskeletal: Negative for arthralgias.  Skin: Negative for itching and rash.  Neurological: Negative for light-headedness and numbness.  Hematological: Negative for adenopathy. Does not bruise/bleed easily.  Psychiatric/Behavioral: Negative for confusion.    Past Medical History:  Diagnosis Date  . AKI (acute kidney injury) ([place]) 01/06/2019  . Anemia   . Fatty liver    on u/s 07/2018  . Hyperlipidemia 03/2004  . Hypertension 1994  . Snores    Past Surgical History:  Procedure Laterality Date  . CATARACT EXTRACTION W/PHACO Right 03/04/2018   Procedure: CATARACT EXTRACTION PHACO AND INTRAOCULAR LENS PLACEMENT ([place])  RIGHT TORIC;  Surgeon: Leandrew Koyanagi, MD;  Location: [place];  Service: Ophthalmology;  Laterality: Right;  [place] no Toric Lens 1:45 5.3  . COLONOSCOPY WITH PROPOFOL N/A 07/20/2018   Procedure: COLONOSCOPY WITH PROPOFOL;  Surgeon: Jonathon Bellows, MD;  Location: [facility] ENDOSCOPY;  Service: Gastroenterology;  Laterality: N/A;  . ESOPHAGOGASTRODUODENOSCOPY (EGD) WITH PROPOFOL N/A 07/20/2018   Procedure: ESOPHAGOGASTRODUODENOSCOPY (EGD) WITH PROPOFOL;  Surgeon: Jonathon Bellows, MD;  Location: [facility] ENDOSCOPY;  Service: Gastroenterology;  Laterality: N/A;  . EYE SURGERY Right   . HERNIA REPAIR    . L Lap herniorraphy  04/2000  . Left wrist ganglionectomy      Family History  Problem Relation Age of Onset  . Hypertension Mother   . Diabetes Mother   . Heart disease Mother        CAD  . Dementia Mother   . Hypertension Father   .  Heart disease Father        MI 02/03  . Colon cancer Father   . Hypertension Sister   . Hypertension Sister   . Hypertension Sister   . Prostate cancer Neg Hx     Social History    Socioeconomic History  . Marital status: Single    Spouse name: Not on file  . Number of children: 1  . Years of education: Not on file  . Highest education level: Not on file  Occupational History  . Occupation: [employer]  Social Needs  . Financial resource strain: Not on file  . Food insecurity:    Worry: Not on file    Inability: Not on file  . Transportation needs:    Medical: Not on file    Non-medical: Not on file  Tobacco Use  . Smoking status: Never Smoker  . Smokeless tobacco: Never Used  . Tobacco comment: Occassionally  Substance and Sexual Activity  . Alcohol use: Not Currently    Alcohol/week: 3.0 standard drinks    Types: 3 Cans of beer per week  . Drug use: No  . Sexual activity: Not on file  Lifestyle  . Physical activity:    Days per week: Not on file    Minutes per session: Not on file  . Stress: Not on file  Relationships  . Social connections:    Talks on phone: Not on file    Gets together: Not on file    Attends religious service: Not on file    Active member of club or organization: Not on file    Attends meetings of clubs or organizations: Not on file    Relationship status: Not on file  . Intimate partner violence:    Fear of current or ex partner: Not on file    Emotionally abused: Not on file    Physically abused: Not on file    Forced sexual activity: Not on file  Other Topics Concern  . Not on file  Social History Narrative   Divorced 2007/11/26, dating as of 11/25/17   His daughter died 4 days after giving birth to the patient's granddaughter   Has joint custody of his dead daughter's child   Works at [employer] is [employer]    Current Outpatient Medications on File Prior to Visit  Medication Sig Dispense Refill  . acetaminophen (TYLENOL) 325 MG tablet Take 650 mg by mouth every 6 (six) hours as needed.    Marland Kitchen acyclovir (ZOVIRAX) 400 MG tablet Take 1 tablet (400 mg total) by mouth 2 (two) times daily. 60 tablet 3  . amLODipine  (NORVASC) 5 MG tablet Take 1-2 tablets (5-10 mg total) by mouth daily. (Patient taking differently: Take 10 mg by mouth daily. ) 180 tablet 3  . co-enzyme Q-10 50 MG capsule Take 50 mg by mouth daily.    . CVS VITAMIN B12 1000 MCG tablet TAKE 1 TABLET BY MOUTH EVERY DAY 90 tablet 1  . dexamethasone (DECADRON) 4 MG tablet Take 10 tablets (40 mg total) by mouth once a week. 30 tablet 0  . lenalidomide (REVLIMID) 25 MG capsule TAKE 1 CAPSULE BY MOUTH DAILY FOR 14 DAYS, THEN HOLD FOR 7 DAYS. REPEAT EVERY 21 DAYS. 14 capsule 0  . metoprolol succinate (TOPROL-XL) 50 MG 24 hr tablet Take 2-3 tablets (100-150 mg total) by mouth daily. (Patient taking differently: Take 150 mg by mouth daily. ) 270 tablet 3   No current facility-administered medications on file prior  to visit.     Allergies  Allergen Reactions  . Bactrim [Sulfamethoxazole-Trimethoprim]     Elevated creatinine  . Tadalafil     Intolerant,  Didn't feel well on med       Observations/Objective: Today's Vitals   01/26/19 1044  PainSc: 0-No pain   There is no height or weight on file to calculate BMI.  Physical Exam  Constitutional: He is oriented to person, place, and time and well-developed, well-nourished, and in no distress. No distress.  HENT:  Head: Normocephalic and atraumatic.  Eyes: EOM are normal.  Neck: Normal range of motion.  Pulmonary/Chest: Effort normal. No respiratory distress.  Neurological: He is alert and oriented to person, place, and time.  Psychiatric: Affect normal.   CBC    Component Value Date/Time   WBC 4.4 01/20/2019 1259   RBC 3.70 (L) 01/20/2019 1259   HGB 11.8 (L) 01/20/2019 1259   HCT 34.0 (L) 01/20/2019 1259   PLT 119 (L) 01/20/2019 1259   MCV 91.9 01/20/2019 1259   MCH 31.9 01/20/2019 1259   MCHC 34.7 01/20/2019 1259   RDW 15.6 (H) 01/20/2019 1259   LYMPHSABS 0.5 (L) 01/20/2019 1259   MONOABS 0.1 01/20/2019 1259   EOSABS 0.0 01/20/2019 1259   BASOSABS 0.0 01/20/2019 1259    CMP      Component Value Date/Time   NA 139 01/20/2019 1259   K 3.7 01/20/2019 1259   CL 106 01/20/2019 1259   CO2 24 01/20/2019 1259   GLUCOSE 192 (H) 01/20/2019 1259   BUN 23 01/20/2019 1259   CREATININE 1.48 (H) 01/20/2019 1259   CALCIUM 9.2 01/20/2019 1259   PROT 7.1 01/20/2019 1259   ALBUMIN 4.4 01/20/2019 1259   AST 25 01/20/2019 1259   ALT 31 01/20/2019 1259   ALKPHOS 62 01/20/2019 1259   BILITOT 0.8 01/20/2019 1259   GFRNONAA 50 (L) 01/20/2019 1259   GFRAA 58 (L) 01/20/2019 1259     Assessment and Plan: 1. Multiple myeloma not having achieved remission ([place])   2. Eyelid cellulitis, left   3. Encounter for antineoplastic chemotherapy   4. Thrombocytopenia ([place])   5. Anemia, unspecified type   6. 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 Multiple myeloma panel after 6 cycles of RVD showed further reduction of involved kappa light chain levelto  117.  Kappa lambda Ratio further reduced to 7.19. Patient will proceed with lab on 01/27/2019, including multiple myeloma panel to evaluate treatment response after 7 cycles of RVD. If CBC, CMP, vital signs meet criteria for treatment, he will proceed with cycle 8-day 1 RVD on 01/27/2019. Revlimid dose 25 mg He will proceed with day 8 RVD on 02/03/2019. He will proceed with lab-CBC CMP, multiple myeloma panel prior to day 15 RVD on 02/10/2019.  #Ask about precaution for COVID-19 pandemic, discussed with patient that he is vulnerable to many infections, including COVID-19 due to multiple myeloma disease nature, and being on chemotherapy. CDC recommended COVID 19 precaution discussed with patient.  #Chronic anemia and thrombocytopenia secondary to chemotherapy.  Continue to monitor.  He has labs tomorrow.  Follow Up Instructions: Follow-up in 4 weeks with lab, MD assessment prior  to cycle 9 RVD.  I discussed the assessment and treatment plan with the patient. The patient was provided an opportunity to ask questions and all were answered. The patient agreed  with the plan and demonstrated an understanding of the instructions.  The patient was advised to call back or seek an in-person evaluation if the symptoms worsen or if the condition fails to improve as anticipated.    Earlie Server, MD 01/26/2019 8:46 PM

## 2019-01-26 NOTE — Progress Notes (Signed)
Called patient for WebEx visit.  Patient states no new concerns today.  Still having some swelling in legs/feet.

## 2019-01-27 ENCOUNTER — Other Ambulatory Visit: Payer: Self-pay

## 2019-01-27 ENCOUNTER — Inpatient Hospital Stay: Payer: BLUE CROSS/BLUE SHIELD

## 2019-01-27 ENCOUNTER — Inpatient Hospital Stay: Payer: BLUE CROSS/BLUE SHIELD | Admitting: Oncology

## 2019-01-27 VITALS — BP 152/90 | HR 72 | Temp 97.0°F | Resp 18 | Wt 214.4 lb

## 2019-01-27 DIAGNOSIS — Z5112 Encounter for antineoplastic immunotherapy: Secondary | ICD-10-CM | POA: Diagnosis not present

## 2019-01-27 DIAGNOSIS — D6959 Other secondary thrombocytopenia: Secondary | ICD-10-CM | POA: Diagnosis not present

## 2019-01-27 DIAGNOSIS — C9000 Multiple myeloma not having achieved remission: Secondary | ICD-10-CM | POA: Diagnosis not present

## 2019-01-27 DIAGNOSIS — Z79899 Other long term (current) drug therapy: Secondary | ICD-10-CM | POA: Diagnosis not present

## 2019-01-27 DIAGNOSIS — D6481 Anemia due to antineoplastic chemotherapy: Secondary | ICD-10-CM | POA: Diagnosis not present

## 2019-01-27 LAB — CBC WITH DIFFERENTIAL/PLATELET
Abs Immature Granulocytes: 0.05 10*3/uL (ref 0.00–0.07)
Basophils Absolute: 0 10*3/uL (ref 0.0–0.1)
Basophils Relative: 1 %
Eosinophils Absolute: 0.1 10*3/uL (ref 0.0–0.5)
Eosinophils Relative: 2 %
HCT: 32.7 % — ABNORMAL LOW (ref 39.0–52.0)
Hemoglobin: 11.4 g/dL — ABNORMAL LOW (ref 13.0–17.0)
Immature Granulocytes: 1 %
Lymphocytes Relative: 24 %
Lymphs Abs: 1.1 10*3/uL (ref 0.7–4.0)
MCH: 32 pg (ref 26.0–34.0)
MCHC: 34.9 g/dL (ref 30.0–36.0)
MCV: 91.9 fL (ref 80.0–100.0)
Monocytes Absolute: 0.6 10*3/uL (ref 0.1–1.0)
Monocytes Relative: 12 %
Neutro Abs: 2.9 10*3/uL (ref 1.7–7.7)
Neutrophils Relative %: 60 %
Platelets: 114 10*3/uL — ABNORMAL LOW (ref 150–400)
RBC: 3.56 MIL/uL — ABNORMAL LOW (ref 4.22–5.81)
RDW: 15.9 % — ABNORMAL HIGH (ref 11.5–15.5)
WBC: 4.8 10*3/uL (ref 4.0–10.5)
nRBC: 0 % (ref 0.0–0.2)

## 2019-01-27 LAB — COMPREHENSIVE METABOLIC PANEL
ALT: 24 U/L (ref 0–44)
AST: 19 U/L (ref 15–41)
Albumin: 4.1 g/dL (ref 3.5–5.0)
Alkaline Phosphatase: 63 U/L (ref 38–126)
Anion gap: 6 (ref 5–15)
BUN: 20 mg/dL (ref 8–23)
CO2: 26 mmol/L (ref 22–32)
Calcium: 9 mg/dL (ref 8.9–10.3)
Chloride: 107 mmol/L (ref 98–111)
Creatinine, Ser: 1.42 mg/dL — ABNORMAL HIGH (ref 0.61–1.24)
GFR calc Af Amer: >60 mL/min (ref >60)
GFR calc non Af Amer: 53 mL/min — ABNORMAL LOW (ref >60)
Glucose, Bld: 109 mg/dL — ABNORMAL HIGH (ref 70–99)
Potassium: 3.7 mmol/L (ref 3.5–5.1)
Sodium: 139 mmol/L (ref 135–145)
Total Bilirubin: 0.7 mg/dL (ref 0.3–1.2)
Total Protein: 6.7 g/dL (ref 6.5–8.1)

## 2019-01-27 MED ORDER — PROCHLORPERAZINE MALEATE 10 MG PO TABS
10.0000 mg | ORAL_TABLET | Freq: Once | ORAL | Status: AC
  Administered 2019-01-27: 10 mg via ORAL
  Filled 2019-01-27: qty 1

## 2019-01-27 MED ORDER — BORTEZOMIB CHEMO SQ INJECTION 3.5 MG (2.5MG/ML)
1.3000 mg/m2 | Freq: Once | INTRAMUSCULAR | Status: AC
  Administered 2019-01-27: 2.75 mg via SUBCUTANEOUS
  Filled 2019-01-27: qty 1.1

## 2019-02-02 ENCOUNTER — Other Ambulatory Visit: Payer: Self-pay

## 2019-02-03 ENCOUNTER — Inpatient Hospital Stay: Payer: BLUE CROSS/BLUE SHIELD

## 2019-02-03 ENCOUNTER — Other Ambulatory Visit: Payer: Self-pay

## 2019-02-03 VITALS — BP 150/90 | HR 92 | Temp 97.7°F | Resp 20 | Wt 212.0 lb

## 2019-02-03 DIAGNOSIS — D6959 Other secondary thrombocytopenia: Secondary | ICD-10-CM | POA: Diagnosis not present

## 2019-02-03 DIAGNOSIS — D6481 Anemia due to antineoplastic chemotherapy: Secondary | ICD-10-CM | POA: Diagnosis not present

## 2019-02-03 DIAGNOSIS — C9000 Multiple myeloma not having achieved remission: Secondary | ICD-10-CM | POA: Diagnosis not present

## 2019-02-03 DIAGNOSIS — Z79899 Other long term (current) drug therapy: Secondary | ICD-10-CM | POA: Diagnosis not present

## 2019-02-03 DIAGNOSIS — Z5112 Encounter for antineoplastic immunotherapy: Secondary | ICD-10-CM | POA: Diagnosis not present

## 2019-02-03 MED ORDER — PROCHLORPERAZINE MALEATE 10 MG PO TABS
10.0000 mg | ORAL_TABLET | Freq: Once | ORAL | Status: AC
  Administered 2019-02-03: 10 mg via ORAL
  Filled 2019-02-03: qty 1

## 2019-02-03 MED ORDER — BORTEZOMIB CHEMO SQ INJECTION 3.5 MG (2.5MG/ML)
1.3000 mg/m2 | Freq: Once | INTRAMUSCULAR | Status: AC
  Administered 2019-02-03: 2.75 mg via SUBCUTANEOUS
  Filled 2019-02-03: qty 1.1

## 2019-02-08 ENCOUNTER — Other Ambulatory Visit: Payer: Self-pay | Admitting: Oncology

## 2019-02-10 ENCOUNTER — Inpatient Hospital Stay: Payer: BLUE CROSS/BLUE SHIELD

## 2019-02-10 ENCOUNTER — Other Ambulatory Visit: Payer: Self-pay

## 2019-02-10 VITALS — BP 157/92 | HR 90 | Temp 97.4°F | Resp 20 | Wt 211.0 lb

## 2019-02-10 DIAGNOSIS — D649 Anemia, unspecified: Secondary | ICD-10-CM

## 2019-02-10 DIAGNOSIS — Z5112 Encounter for antineoplastic immunotherapy: Secondary | ICD-10-CM | POA: Diagnosis not present

## 2019-02-10 DIAGNOSIS — C9000 Multiple myeloma not having achieved remission: Secondary | ICD-10-CM

## 2019-02-10 DIAGNOSIS — D6481 Anemia due to antineoplastic chemotherapy: Secondary | ICD-10-CM | POA: Diagnosis not present

## 2019-02-10 DIAGNOSIS — D696 Thrombocytopenia, unspecified: Secondary | ICD-10-CM

## 2019-02-10 DIAGNOSIS — D6959 Other secondary thrombocytopenia: Secondary | ICD-10-CM | POA: Diagnosis not present

## 2019-02-10 DIAGNOSIS — Z5111 Encounter for antineoplastic chemotherapy: Secondary | ICD-10-CM

## 2019-02-10 DIAGNOSIS — Z79899 Other long term (current) drug therapy: Secondary | ICD-10-CM | POA: Diagnosis not present

## 2019-02-10 LAB — COMPREHENSIVE METABOLIC PANEL
ALT: 28 U/L (ref 0–44)
AST: 25 U/L (ref 15–41)
Albumin: 4.3 g/dL (ref 3.5–5.0)
Alkaline Phosphatase: 68 U/L (ref 38–126)
Anion gap: 10 (ref 5–15)
BUN: 24 mg/dL — ABNORMAL HIGH (ref 8–23)
CO2: 26 mmol/L (ref 22–32)
Calcium: 9.3 mg/dL (ref 8.9–10.3)
Chloride: 103 mmol/L (ref 98–111)
Creatinine, Ser: 1.66 mg/dL — ABNORMAL HIGH (ref 0.61–1.24)
GFR calc Af Amer: 50 mL/min — ABNORMAL LOW (ref >60)
GFR calc non Af Amer: 44 mL/min — ABNORMAL LOW (ref >60)
Glucose, Bld: 164 mg/dL — ABNORMAL HIGH (ref 70–99)
Potassium: 3.7 mmol/L (ref 3.5–5.1)
Sodium: 139 mmol/L (ref 135–145)
Total Bilirubin: 0.6 mg/dL (ref 0.3–1.2)
Total Protein: 7.2 g/dL (ref 6.5–8.1)

## 2019-02-10 LAB — CBC WITH DIFFERENTIAL/PLATELET
Abs Immature Granulocytes: 0.03 10*3/uL (ref 0.00–0.07)
Basophils Absolute: 0 10*3/uL (ref 0.0–0.1)
Basophils Relative: 0 %
Eosinophils Absolute: 0 10*3/uL (ref 0.0–0.5)
Eosinophils Relative: 1 %
HCT: 34 % — ABNORMAL LOW (ref 39.0–52.0)
Hemoglobin: 12 g/dL — ABNORMAL LOW (ref 13.0–17.0)
Immature Granulocytes: 1 %
Lymphocytes Relative: 11 %
Lymphs Abs: 0.6 10*3/uL — ABNORMAL LOW (ref 0.7–4.0)
MCH: 32.4 pg (ref 26.0–34.0)
MCHC: 35.3 g/dL (ref 30.0–36.0)
MCV: 91.9 fL (ref 80.0–100.0)
Monocytes Absolute: 0.1 10*3/uL (ref 0.1–1.0)
Monocytes Relative: 3 %
Neutro Abs: 4.3 10*3/uL (ref 1.7–7.7)
Neutrophils Relative %: 84 %
Platelets: 152 10*3/uL (ref 150–400)
RBC: 3.7 MIL/uL — ABNORMAL LOW (ref 4.22–5.81)
RDW: 15.4 % (ref 11.5–15.5)
WBC: 5.1 10*3/uL (ref 4.0–10.5)
nRBC: 0 % (ref 0.0–0.2)

## 2019-02-10 MED ORDER — PROCHLORPERAZINE MALEATE 10 MG PO TABS
10.0000 mg | ORAL_TABLET | Freq: Once | ORAL | Status: AC
  Administered 2019-02-10: 10 mg via ORAL
  Filled 2019-02-10: qty 1

## 2019-02-10 MED ORDER — BORTEZOMIB CHEMO SQ INJECTION 3.5 MG (2.5MG/ML)
1.3000 mg/m2 | Freq: Once | INTRAMUSCULAR | Status: AC
  Administered 2019-02-10: 2.75 mg via SUBCUTANEOUS
  Filled 2019-02-10: qty 1.1

## 2019-02-11 LAB — MULTIPLE MYELOMA PANEL, SERUM
Albumin SerPl Elph-Mcnc: 3.9 g/dL (ref 2.9–4.4)
Albumin/Glob SerPl: 1.7 (ref 0.7–1.7)
Alpha 1: 0.2 g/dL (ref 0.0–0.4)
Alpha2 Glob SerPl Elph-Mcnc: 0.7 g/dL (ref 0.4–1.0)
B-Globulin SerPl Elph-Mcnc: 1.1 g/dL (ref 0.7–1.3)
Gamma Glob SerPl Elph-Mcnc: 0.4 g/dL (ref 0.4–1.8)
Globulin, Total: 2.4 g/dL (ref 2.2–3.9)
IgA: 262 mg/dL (ref 61–437)
IgG (Immunoglobin G), Serum: 722 mg/dL (ref 603–1613)
IgM (Immunoglobulin M), Srm: 18 mg/dL — ABNORMAL LOW (ref 20–172)
Total Protein ELP: 6.3 g/dL (ref 6.0–8.5)

## 2019-02-11 LAB — KAPPA/LAMBDA LIGHT CHAINS
Kappa free light chain: 74 mg/L — ABNORMAL HIGH (ref 3.3–19.4)
Kappa, lambda light chain ratio: 4 — ABNORMAL HIGH (ref 0.26–1.65)
Lambda free light chains: 18.5 mg/L (ref 5.7–26.3)

## 2019-02-12 ENCOUNTER — Other Ambulatory Visit: Payer: Self-pay | Admitting: Oncology

## 2019-02-12 MED ORDER — DEXAMETHASONE 4 MG PO TABS: 40.0000 mg | ORAL_TABLET | ORAL | 0 refills | Status: DC

## 2019-02-16 ENCOUNTER — Other Ambulatory Visit: Payer: Self-pay

## 2019-02-17 ENCOUNTER — Other Ambulatory Visit: Payer: Self-pay

## 2019-02-17 ENCOUNTER — Inpatient Hospital Stay (HOSPITAL_BASED_OUTPATIENT_CLINIC_OR_DEPARTMENT_OTHER): Payer: BC Managed Care – PPO | Admitting: Oncology

## 2019-02-17 ENCOUNTER — Encounter: Payer: Self-pay | Admitting: Oncology

## 2019-02-17 ENCOUNTER — Inpatient Hospital Stay: Payer: BC Managed Care – PPO

## 2019-02-17 ENCOUNTER — Inpatient Hospital Stay: Payer: BC Managed Care – PPO | Attending: Oncology

## 2019-02-17 VITALS — BP 136/92 | HR 86 | Temp 97.0°F | Resp 18 | Wt 212.2 lb

## 2019-02-17 DIAGNOSIS — D696 Thrombocytopenia, unspecified: Secondary | ICD-10-CM

## 2019-02-17 DIAGNOSIS — C9000 Multiple myeloma not having achieved remission: Secondary | ICD-10-CM | POA: Diagnosis not present

## 2019-02-17 DIAGNOSIS — D6481 Anemia due to antineoplastic chemotherapy: Secondary | ICD-10-CM | POA: Insufficient documentation

## 2019-02-17 DIAGNOSIS — D6959 Other secondary thrombocytopenia: Secondary | ICD-10-CM | POA: Insufficient documentation

## 2019-02-17 DIAGNOSIS — D649 Anemia, unspecified: Secondary | ICD-10-CM | POA: Diagnosis not present

## 2019-02-17 DIAGNOSIS — M5441 Lumbago with sciatica, right side: Secondary | ICD-10-CM | POA: Diagnosis not present

## 2019-02-17 DIAGNOSIS — I10 Essential (primary) hypertension: Secondary | ICD-10-CM | POA: Diagnosis not present

## 2019-02-17 DIAGNOSIS — T451X5A Adverse effect of antineoplastic and immunosuppressive drugs, initial encounter: Secondary | ICD-10-CM | POA: Diagnosis not present

## 2019-02-17 DIAGNOSIS — Z5112 Encounter for antineoplastic immunotherapy: Secondary | ICD-10-CM | POA: Insufficient documentation

## 2019-02-17 DIAGNOSIS — Z79899 Other long term (current) drug therapy: Secondary | ICD-10-CM

## 2019-02-17 DIAGNOSIS — Z5111 Encounter for antineoplastic chemotherapy: Secondary | ICD-10-CM

## 2019-02-17 LAB — CBC WITH DIFFERENTIAL/PLATELET
Abs Immature Granulocytes: 0.03 10*3/uL (ref 0.00–0.07)
Basophils Absolute: 0 10*3/uL (ref 0.0–0.1)
Basophils Relative: 0 %
Eosinophils Absolute: 0.1 10*3/uL (ref 0.0–0.5)
Eosinophils Relative: 1 %
HCT: 34 % — ABNORMAL LOW (ref 39.0–52.0)
Hemoglobin: 12 g/dL — ABNORMAL LOW (ref 13.0–17.0)
Immature Granulocytes: 1 %
Lymphocytes Relative: 11 %
Lymphs Abs: 0.6 10*3/uL — ABNORMAL LOW (ref 0.7–4.0)
MCH: 33.1 pg (ref 26.0–34.0)
MCHC: 35.3 g/dL (ref 30.0–36.0)
MCV: 93.9 fL (ref 80.0–100.0)
Monocytes Absolute: 0.1 10*3/uL (ref 0.1–1.0)
Monocytes Relative: 2 %
Neutro Abs: 4.9 10*3/uL (ref 1.7–7.7)
Neutrophils Relative %: 85 %
Platelets: 146 10*3/uL — ABNORMAL LOW (ref 150–400)
RBC: 3.62 MIL/uL — ABNORMAL LOW (ref 4.22–5.81)
RDW: 15.8 % — ABNORMAL HIGH (ref 11.5–15.5)
WBC: 5.8 10*3/uL (ref 4.0–10.5)
nRBC: 0 % (ref 0.0–0.2)

## 2019-02-17 LAB — COMPREHENSIVE METABOLIC PANEL
ALT: 29 U/L (ref 0–44)
AST: 26 U/L (ref 15–41)
Albumin: 4.4 g/dL (ref 3.5–5.0)
Alkaline Phosphatase: 66 U/L (ref 38–126)
Anion gap: 10 (ref 5–15)
BUN: 22 mg/dL (ref 8–23)
CO2: 27 mmol/L (ref 22–32)
Calcium: 9.5 mg/dL (ref 8.9–10.3)
Chloride: 104 mmol/L (ref 98–111)
Creatinine, Ser: 1.67 mg/dL — ABNORMAL HIGH (ref 0.61–1.24)
GFR calc Af Amer: 50 mL/min — ABNORMAL LOW (ref >60)
GFR calc non Af Amer: 43 mL/min — ABNORMAL LOW (ref >60)
Glucose, Bld: 160 mg/dL — ABNORMAL HIGH (ref 70–99)
Potassium: 4.5 mmol/L (ref 3.5–5.1)
Sodium: 141 mmol/L (ref 135–145)
Total Bilirubin: 0.7 mg/dL (ref 0.3–1.2)
Total Protein: 7.5 g/dL (ref 6.5–8.1)

## 2019-02-17 MED ORDER — BORTEZOMIB CHEMO SQ INJECTION 3.5 MG (2.5MG/ML)
1.3000 mg/m2 | Freq: Once | INTRAMUSCULAR | Status: AC
  Administered 2019-02-17: 2.75 mg via SUBCUTANEOUS
  Filled 2019-02-17: qty 1.1

## 2019-02-17 MED ORDER — PROCHLORPERAZINE MALEATE 10 MG PO TABS
10.0000 mg | ORAL_TABLET | Freq: Once | ORAL | Status: AC
  Administered 2019-02-17: 10 mg via ORAL
  Filled 2019-02-17: qty 1

## 2019-02-17 NOTE — Progress Notes (Signed)
HEMATOLOGY-ONCOLOGY TeleHEALTH VISIT PROGRESS NOTE  I connected with [name] on 02/17/19 at  8:45 AM EDT by video enabled telemedicine visit and verified that I am speaking with the correct person using two identifiers. I discussed the limitations, risks, security and privacy concerns of performing an evaluation and management service by telemedicine and the availability of in-person appointments. I also discussed with the patient that there may be a patient responsible charge related to this service. The patient expressed understanding and agreed to proceed.   Other persons participating in the visit and their role in the encounter:  Geraldine Solar, CMA, check in patient     Patient's location: Home  Provider's location:work  Chief Complaint: Follow-up for evaluation prior to chemotherapy for treatment of multiple myeloma.   INTERVAL HISTORY Luis Burke is a 63 y.o. male who has above history reviewed by me today presents for follow up visit for evaluation prior to chemotherapy for treatment of light chain multiple myeloma. Problems and complaints are listed below:  Patient has finished 8 cycles of RVD.  Tolerating well. Minimal neuropathy manageable. Denies any fever, chills, abdominal pain, chest pain, shortness of breath. No acute bleeding events. Chronic lower extremity ankle level swelling, stable.  Previous work-up negative for thrombosis.  Patient is taking aspirin and acyclovir.  Review of Systems  Constitutional: Positive for fatigue. Negative for appetite change, chills, fever and unexpected weight change.  HENT:   Negative for hearing loss and voice change.   Eyes: Negative for eye problems and icterus.  Respiratory: Negative for chest tightness, cough and shortness of breath.   Cardiovascular: Positive for leg swelling. Negative for chest pain.  Gastrointestinal: Negative for abdominal distention and abdominal pain.  Endocrine: Negative for hot flashes.   Genitourinary: Negative for difficulty urinating, dysuria and frequency.   Musculoskeletal: Negative for arthralgias.  Skin: Negative for itching and rash.  Neurological: Negative for light-headedness and numbness.  Hematological: Negative for adenopathy. Does not bruise/bleed easily.  Psychiatric/Behavioral: Negative for confusion.    Past Medical History:  Diagnosis Date  . AKI (acute kidney injury) ([place]) 01/06/2019  . Anemia   . Fatty liver    on u/s 07/2018  . Hyperlipidemia 03/2004  . Hypertension 1994  . Snores    Past Surgical History:  Procedure Laterality Date  . CATARACT EXTRACTION W/PHACO Right 03/04/2018   Procedure: CATARACT EXTRACTION PHACO AND INTRAOCULAR LENS PLACEMENT ([place])  RIGHT TORIC;  Surgeon: Leandrew Koyanagi, MD;  Location: [place];  Service: Ophthalmology;  Laterality: Right;  [place] no Toric Lens 1:45 5.3  . COLONOSCOPY WITH PROPOFOL N/A 07/20/2018   Procedure: COLONOSCOPY WITH PROPOFOL;  Surgeon: Jonathon Bellows, MD;  Location: [facility] ENDOSCOPY;  Service: Gastroenterology;  Laterality: N/A;  . ESOPHAGOGASTRODUODENOSCOPY (EGD) WITH PROPOFOL N/A 07/20/2018   Procedure: ESOPHAGOGASTRODUODENOSCOPY (EGD) WITH PROPOFOL;  Surgeon: Jonathon Bellows, MD;  Location: [facility] ENDOSCOPY;  Service: Gastroenterology;  Laterality: N/A;  . EYE SURGERY Right   . HERNIA REPAIR    . L Lap herniorraphy  04/2000  . Left wrist ganglionectomy      Family History  Problem Relation Age of Onset  . Hypertension Mother   . Diabetes Mother   . Heart disease Mother        CAD  . Dementia Mother   . Hypertension Father   . Heart disease Father        MI 02/03  . Colon cancer Father   . Hypertension Sister   . Hypertension Sister   .  Hypertension Sister   . Prostate cancer Neg Hx     Social History   Socioeconomic History  . Marital status: Single    Spouse name: Not on file  . Number of children: 1  . Years of education: Not on file  . Highest education level: Not on  file  Occupational History  . Occupation: [employer]  Social Needs  . Financial resource strain: Not on file  . Food insecurity:    Worry: Not on file    Inability: Not on file  . Transportation needs:    Medical: Not on file    Non-medical: Not on file  Tobacco Use  . Smoking status: Never Smoker  . Smokeless tobacco: Never Used  . Tobacco comment: Occassionally  Substance and Sexual Activity  . Alcohol use: Not Currently    Alcohol/week: 3.0 standard drinks    Types: 3 Cans of beer per week  . Drug use: No  . Sexual activity: Not on file  Lifestyle  . Physical activity:    Days per week: Not on file    Minutes per session: Not on file  . Stress: Not on file  Relationships  . Social connections:    Talks on phone: Not on file    Gets together: Not on file    Attends religious service: Not on file    Active member of club or organization: Not on file    Attends meetings of clubs or organizations: Not on file    Relationship status: Not on file  . Intimate partner violence:    Fear of current or ex partner: Not on file    Emotionally abused: Not on file    Physically abused: Not on file    Forced sexual activity: Not on file  Other Topics Concern  . Not on file  Social History Narrative   Divorced 12-13-2007, dating as of December 12, 2017   His daughter died 4 days after giving birth to the patient's granddaughter   Has joint custody of his dead daughter's child   Works at [employer] is [employer]    Current Outpatient Medications on File Prior to Visit  Medication Sig Dispense Refill  . acetaminophen (TYLENOL) 325 MG tablet Take 650 mg by mouth every 6 (six) hours as needed.    Marland Kitchen acyclovir (ZOVIRAX) 400 MG tablet Take 1 tablet (400 mg total) by mouth 2 (two) times daily. 60 tablet 3  . amLODipine (NORVASC) 5 MG tablet Take 1-2 tablets (5-10 mg total) by mouth daily. (Patient taking differently: Take 10 mg by mouth daily. ) 180 tablet 3  . co-enzyme Q-10 50 MG capsule Take 50  mg by mouth daily.    . CVS VITAMIN B12 1000 MCG tablet TAKE 1 TABLET BY MOUTH EVERY DAY 90 tablet 1  . dexamethasone (DECADRON) 4 MG tablet Take 10 tablets (40 mg total) by mouth once a week. 30 tablet 0  . lenalidomide (REVLIMID) 25 MG capsule TAKE 1 CAPSULE BY MOUTH EVERY DAY FOR 14 DAYS  THEN HOLD FOR 7 DAYS, REPEAT EVERY 21 DAYS 14 capsule 0  . metoprolol succinate (TOPROL-XL) 50 MG 24 hr tablet Take 2-3 tablets (100-150 mg total) by mouth daily. (Patient taking differently: Take 150 mg by mouth daily. ) 270 tablet 3   No current facility-administered medications on file prior to visit.     Allergies  Allergen Reactions  . Bactrim [Sulfamethoxazole-Trimethoprim]     Elevated creatinine  . Tadalafil     Intolerant,  Didn't feel well on med       Observations/Objective: There were no vitals filed for this visit. There is no height or weight on file to calculate BMI.  Pain level 0 home. Physical Exam  Constitutional: He is oriented to person, place, and time and well-developed, well-nourished, and in no distress. No distress.  HENT:  Head: Normocephalic and atraumatic.  Neck: Normal range of motion.  Pulmonary/Chest: Effort normal.  Neurological: He is alert and oriented to person, place, and time.  Psychiatric: Affect normal.   CBC    Component Value Date/Time   WBC 5.1 02/10/2019 1255   RBC 3.70 (L) 02/10/2019 1255   HGB 12.0 (L) 02/10/2019 1255   HCT 34.0 (L) 02/10/2019 1255   PLT 152 02/10/2019 1255   MCV 91.9 02/10/2019 1255   MCH 32.4 02/10/2019 1255   MCHC 35.3 02/10/2019 1255   RDW 15.4 02/10/2019 1255   LYMPHSABS 0.6 (L) 02/10/2019 1255   MONOABS 0.1 02/10/2019 1255   EOSABS 0.0 02/10/2019 1255   BASOSABS 0.0 02/10/2019 1255    CMP     Component Value Date/Time   NA 139 02/10/2019 1255   K 3.7 02/10/2019 1255   CL 103 02/10/2019 1255   CO2 26 02/10/2019 1255   GLUCOSE 164 (H) 02/10/2019 1255   BUN 24 (H) 02/10/2019 1255   CREATININE 1.66 (H)  02/10/2019 1255   CALCIUM 9.3 02/10/2019 1255   PROT 7.2 02/10/2019 1255   ALBUMIN 4.3 02/10/2019 1255   AST 25 02/10/2019 1255   ALT 28 02/10/2019 1255   ALKPHOS 68 02/10/2019 1255   BILITOT 0.6 02/10/2019 1255   GFRNONAA 44 (L) 02/10/2019 1255   GFRAA 50 (L) 02/10/2019 1255     Assessment and Plan: 1. Multiple myeloma not having achieved remission ([place])   2. Encounter for antineoplastic chemotherapy   3. Thrombocytopenia ([place])   4. Anemia, unspecified type   5. Light chain myeloma (HCC)   Stage II light chain multiple myeloma- cytogenetics showed normal male chromosome, MDS FISH panel showed trisomy 55- Standard Risk 02/10/2019 after 8 cycles of RVD, multiple myeloma panel was reviewed and discussed with patient.   He has had a very good response, difference between involved and uninvolved light chain level decreased more than 90%. Kappa light chain has reduced to 74 and kappa lambda light chain ratio reduced to 4.  I had a discussion with [facility] transplant team Dr. Maylene Roes.  Given COVID 19 pandemic situation, bone marrow transplant has been on hold currently.  Anticipate to resume gradually in June. Dr. Maylene Roes recommend to continue patient on RVD regimen to June and he does not think this will impact patient's stem cell mobilization.  If CBC, CMP, vital signs meet criteria for treatment, he will proceed with cycle 8-day 1 Velcade on 01/27/2019. Start cycle 8 Revlimid dose 25 mg He will proceed with day 8 Velcade on 02/24/2019, Labs done 03/03/2019, proceed to day 15 Velcade on 03/03/2019.  #Chronic anemia and thrombocytopenia secondary to chemotherapy.  Continue to monitor.  Follow Up Instructions: Follow-up in 3 weeks for lab and MD assessment prior to cycle 10 RVD.  I discussed the assessment and treatment plan with the patient. The patient was provided an opportunity to ask questions and all were answered. The patient agreed with the plan and demonstrated an understanding of the  instructions.  The patient was advised to call back or seek an in-person evaluation if the symptoms worsen or if the condition fails to improve  as anticipated.    Earlie Server, MD 02/17/2019 8:52 AM

## 2019-02-17 NOTE — Progress Notes (Signed)
Called patient for Telehealth visit via [place].  Patient c/o right hip pain for about 3 days last week, pain has resolved.

## 2019-02-24 ENCOUNTER — Inpatient Hospital Stay: Payer: BC Managed Care – PPO

## 2019-02-24 ENCOUNTER — Other Ambulatory Visit: Payer: Self-pay

## 2019-02-24 VITALS — BP 159/90 | HR 71 | Temp 97.6°F | Resp 20 | Wt 213.8 lb

## 2019-02-24 DIAGNOSIS — D6481 Anemia due to antineoplastic chemotherapy: Secondary | ICD-10-CM | POA: Diagnosis not present

## 2019-02-24 DIAGNOSIS — T451X5A Adverse effect of antineoplastic and immunosuppressive drugs, initial encounter: Secondary | ICD-10-CM | POA: Diagnosis not present

## 2019-02-24 DIAGNOSIS — C9000 Multiple myeloma not having achieved remission: Secondary | ICD-10-CM

## 2019-02-24 DIAGNOSIS — Z5112 Encounter for antineoplastic immunotherapy: Secondary | ICD-10-CM | POA: Diagnosis not present

## 2019-02-24 DIAGNOSIS — D6959 Other secondary thrombocytopenia: Secondary | ICD-10-CM | POA: Diagnosis not present

## 2019-02-24 DIAGNOSIS — M5441 Lumbago with sciatica, right side: Secondary | ICD-10-CM | POA: Diagnosis not present

## 2019-02-24 MED ORDER — BORTEZOMIB CHEMO SQ INJECTION 3.5 MG (2.5MG/ML)
1.3000 mg/m2 | Freq: Once | INTRAMUSCULAR | Status: AC
  Administered 2019-02-24: 2.75 mg via SUBCUTANEOUS
  Filled 2019-02-24: qty 1.1

## 2019-02-24 MED ORDER — PROCHLORPERAZINE MALEATE 10 MG PO TABS
10.0000 mg | ORAL_TABLET | Freq: Once | ORAL | Status: AC
  Administered 2019-02-24: 10 mg via ORAL
  Filled 2019-02-24: qty 1

## 2019-03-01 ENCOUNTER — Other Ambulatory Visit: Payer: Self-pay | Admitting: Oncology

## 2019-03-02 ENCOUNTER — Telehealth: Payer: Self-pay | Admitting: *Deleted

## 2019-03-02 ENCOUNTER — Other Ambulatory Visit: Payer: Self-pay

## 2019-03-02 ENCOUNTER — Ambulatory Visit (INDEPENDENT_AMBULATORY_CARE_PROVIDER_SITE_OTHER): Payer: BLUE CROSS/BLUE SHIELD | Admitting: Family Medicine

## 2019-03-02 DIAGNOSIS — M79606 Pain in leg, unspecified: Secondary | ICD-10-CM | POA: Diagnosis not present

## 2019-03-02 MED ORDER — AMLODIPINE BESYLATE 5 MG PO TABS: 10.0000 mg | ORAL_TABLET | Freq: Every day | ORAL | Status: DC

## 2019-03-02 MED ORDER — METOPROLOL SUCCINATE ER 50 MG PO TB24: 150.0000 mg | ORAL_TABLET | Freq: Every day | ORAL | Status: DC

## 2019-03-02 NOTE — Progress Notes (Signed)
Virtual visit completed through WebEx or similar program Patient location: home  Provider location: [place] at [facility], office   Limitations and rationale for visit method d/w patient.  Patient agreed to proceed.   CC: groin pain.   HPI:  Pandemic considerations d/w pt.  He is managing.  Girlfriend has been getting groceries, etc.    Wilburn Mylar he noted some sharp pain in the R groin.   Similar sx but less severe sx about 2 weeks ago.  That had resolved.  No L sided sx.    Putting pressure on it helped last night.  He took some tylenol.  He had some leftover oxycodone.  He didn't use it, hasn't used it.  He was asking if that was a reasonable option if he has a significant return of pain. He doesn't feel a lump or a bulge.  No sx with a cough.   Some radicular pain down the R posterior leg, down to the R knee.  No L leg sx.  Some R testicle pain intermittently, since last night.  No burning with urination or blood in urine.    No rash.  No FCNAVD.  No trigger, no trauma.    H/o B hernia repairs.  Pain is better now after taking tylenol at 1PM but still with discomfort.  Last BM was today.  This does not feel like the previous pain he had with hernia in the past.  No weakness and he has sensation intact in the feet.    Meds and allergies reviewed.   ROS: Per HPI unless specifically indicated in ROS section   NAD Speech wnl  A/P:  Groin pain.  With sciatica sx down the right leg.  All of this could be related to sciatica. SLR is positive when he tried that at home.  This was demonstrated via web visit and when he reproduced it at home he had a positive straight leg raise. He can try Tylenol if needed.  Would be okay to try oxycodone 5mg  prn tonight if needed. He is due for his routine dose of Decadron tomorrow and that may help.  Discussed.  He also is going to follow-up with the cancer clinic and get their input.  He will update me as needed.  He agrees with plan.  Routine ER  cautions given.  I do not think there is any emergent testicular issue or other emergent issue that would need evaluation right now.

## 2019-03-02 NOTE — Telephone Encounter (Signed)
Patient called reporting right groin pain which kept him awake last night. Please advise

## 2019-03-02 NOTE — Telephone Encounter (Signed)
He has a scheduled appt tomorrow

## 2019-03-02 NOTE — Telephone Encounter (Signed)
PCP or he can come to see me. Thanks

## 2019-03-02 NOTE — Telephone Encounter (Signed)
Patient states he cannot get in with his PCP today so he will just wait until tomorrow and see Dr Tasia Catchings then. He did not want to go to Urgent Care or ER

## 2019-03-03 ENCOUNTER — Inpatient Hospital Stay: Payer: BC Managed Care – PPO

## 2019-03-03 ENCOUNTER — Inpatient Hospital Stay (HOSPITAL_BASED_OUTPATIENT_CLINIC_OR_DEPARTMENT_OTHER): Payer: BC Managed Care – PPO | Admitting: Oncology

## 2019-03-03 ENCOUNTER — Other Ambulatory Visit: Payer: Self-pay

## 2019-03-03 ENCOUNTER — Encounter: Payer: Self-pay | Admitting: Oncology

## 2019-03-03 VITALS — BP 143/86 | HR 78 | Temp 96.0°F | Wt 211.6 lb

## 2019-03-03 DIAGNOSIS — C9000 Multiple myeloma not having achieved remission: Secondary | ICD-10-CM

## 2019-03-03 DIAGNOSIS — D696 Thrombocytopenia, unspecified: Secondary | ICD-10-CM

## 2019-03-03 DIAGNOSIS — D649 Anemia, unspecified: Secondary | ICD-10-CM

## 2019-03-03 DIAGNOSIS — D6481 Anemia due to antineoplastic chemotherapy: Secondary | ICD-10-CM | POA: Diagnosis not present

## 2019-03-03 DIAGNOSIS — M5441 Lumbago with sciatica, right side: Secondary | ICD-10-CM

## 2019-03-03 DIAGNOSIS — T451X5A Adverse effect of antineoplastic and immunosuppressive drugs, initial encounter: Secondary | ICD-10-CM | POA: Diagnosis not present

## 2019-03-03 DIAGNOSIS — D6959 Other secondary thrombocytopenia: Secondary | ICD-10-CM | POA: Diagnosis not present

## 2019-03-03 DIAGNOSIS — M79606 Pain in leg, unspecified: Secondary | ICD-10-CM | POA: Insufficient documentation

## 2019-03-03 DIAGNOSIS — Z5111 Encounter for antineoplastic chemotherapy: Secondary | ICD-10-CM

## 2019-03-03 DIAGNOSIS — Z5112 Encounter for antineoplastic immunotherapy: Secondary | ICD-10-CM | POA: Diagnosis not present

## 2019-03-03 LAB — COMPREHENSIVE METABOLIC PANEL
ALT: 31 U/L (ref 0–44)
AST: 23 U/L (ref 15–41)
Albumin: 4.2 g/dL (ref 3.5–5.0)
Alkaline Phosphatase: 55 U/L (ref 38–126)
Anion gap: 11 (ref 5–15)
BUN: 22 mg/dL (ref 8–23)
CO2: 26 mmol/L (ref 22–32)
Calcium: 9 mg/dL (ref 8.9–10.3)
Chloride: 98 mmol/L (ref 98–111)
Creatinine, Ser: 1.62 mg/dL — ABNORMAL HIGH (ref 0.61–1.24)
GFR calc Af Amer: 52 mL/min — ABNORMAL LOW (ref >60)
GFR calc non Af Amer: 45 mL/min — ABNORMAL LOW (ref >60)
Glucose, Bld: 172 mg/dL — ABNORMAL HIGH (ref 70–99)
Potassium: 4.1 mmol/L (ref 3.5–5.1)
Sodium: 135 mmol/L (ref 135–145)
Total Bilirubin: 1 mg/dL (ref 0.3–1.2)
Total Protein: 7.1 g/dL (ref 6.5–8.1)

## 2019-03-03 LAB — CBC WITH DIFFERENTIAL/PLATELET
Abs Immature Granulocytes: 0.03 10*3/uL (ref 0.00–0.07)
Basophils Absolute: 0 10*3/uL (ref 0.0–0.1)
Basophils Relative: 0 %
Eosinophils Absolute: 0 10*3/uL (ref 0.0–0.5)
Eosinophils Relative: 0 %
HCT: 34.4 % — ABNORMAL LOW (ref 39.0–52.0)
Hemoglobin: 11.9 g/dL — ABNORMAL LOW (ref 13.0–17.0)
Immature Granulocytes: 0 %
Lymphocytes Relative: 7 %
Lymphs Abs: 0.5 10*3/uL — ABNORMAL LOW (ref 0.7–4.0)
MCH: 32.8 pg (ref 26.0–34.0)
MCHC: 34.6 g/dL (ref 30.0–36.0)
MCV: 94.8 fL (ref 80.0–100.0)
Monocytes Absolute: 0.2 10*3/uL (ref 0.1–1.0)
Monocytes Relative: 2 %
Neutro Abs: 6.7 10*3/uL (ref 1.7–7.7)
Neutrophils Relative %: 91 %
Platelets: 97 10*3/uL — ABNORMAL LOW (ref 150–400)
RBC: 3.63 MIL/uL — ABNORMAL LOW (ref 4.22–5.81)
RDW: 15.6 % — ABNORMAL HIGH (ref 11.5–15.5)
WBC: 7.4 10*3/uL (ref 4.0–10.5)
nRBC: 0 % (ref 0.0–0.2)

## 2019-03-03 MED ORDER — PROCHLORPERAZINE MALEATE 10 MG PO TABS
10.0000 mg | ORAL_TABLET | Freq: Once | ORAL | Status: AC
  Administered 2019-03-03: 10 mg via ORAL
  Filled 2019-03-03: qty 1

## 2019-03-03 MED ORDER — BORTEZOMIB CHEMO SQ INJECTION 3.5 MG (2.5MG/ML)
1.3000 mg/m2 | Freq: Once | INTRAMUSCULAR | Status: AC
  Administered 2019-03-03: 2.75 mg via SUBCUTANEOUS
  Filled 2019-03-03: qty 1.1

## 2019-03-03 MED ORDER — ACYCLOVIR 400 MG PO TABS: 400.0000 mg | ORAL_TABLET | Freq: Two times a day (BID) | ORAL | 3 refills | Status: DC

## 2019-03-03 MED ORDER — CYCLOBENZAPRINE HCL 5 MG PO TABS: 5.0000 mg | ORAL_TABLET | Freq: Three times a day (TID) | ORAL | 0 refills | Status: DC | PRN

## 2019-03-03 MED ORDER — DEXAMETHASONE 4 MG PO TABS: 40.0000 mg | ORAL_TABLET | ORAL | 0 refills | Status: DC

## 2019-03-03 NOTE — Progress Notes (Signed)
Patient here today for follow up.  Patient c/o right lower back pain that radiates down right leg, with groin pain.  Patient see PCP yesterday for this, was advised to take Oxycodone 5mg  which the patient already had on hand from months back that were not used, patient reports that oxycodone has helped in relieving some of the pain.  Patient reports pain at 5 out of 10 today while in clinic.

## 2019-03-03 NOTE — Assessment & Plan Note (Signed)
Groin pain.  With sciatica sx down the right leg.  All of this could be related to sciatica. SLR is positive when he tried that at home.  This was demonstrated via web visit and when he reproduced it at home he had a positive straight leg raise. He can try Tylenol if needed.  Would be okay to try oxycodone 5mg  prn tonight if needed. He is due for his routine dose of Decadron tomorrow and that may help.  Discussed.  He also is going to follow-up with the cancer clinic and get their input.  He will update me as needed.  He agrees with plan.  Routine ER cautions given.  I do not think there is any emergent testicular issue or other emergent issue that would need evaluation right now.

## 2019-03-05 ENCOUNTER — Other Ambulatory Visit: Payer: Self-pay | Admitting: *Deleted

## 2019-03-05 MED ORDER — LENALIDOMIDE 25 MG PO CAPS: ORAL_CAPSULE | ORAL | 0 refills | Status: DC

## 2019-03-06 NOTE — Progress Notes (Signed)
Hematology/Oncology  Follow up note [facility] Telephone:([phone]) [phone] [phone]) [phone]   Patient Care Team: Tonia Ghent, MD as PCP - General (Family Medicine)  REFERRING PROVIDER: Napoleon Form REASON FOR VISIT Follow up for multiple myeloma.  Groin pain.  HISTORY OF PRESENTING ILLNESS:  Luis Burke is a  63 y.o.  male with PMH listed below who was referred to me for evaluation of anemia and thrombocytopenia Associated signs and symptoms: Patient reports feeling fatigue.  Denies SOB with exertion.  Denies weight loss, easy bruising, hematochezia, hemoptysis, hematuria. Context: History of GI bleeding: Denies               History of Chronic kidney disease yes               History of autoimmune disease denies denies               History of hemolytic anemia.                Last colonoscopy: 07/20/2018 upper and lower endoscopy showed esophagitis, normal examined duodenum, 2 polyps removed in the ascending colon, resected and retrieved.  Otherwise normal. He drinks alcohol, usually a few beers during the weekends.  Denies any smoking history.   Intended weight loss 3 pounds for the past 3 months.  # 07/27/2018 multiple myeloma panel showed M protein of 0.1, IgG 662, IgA 49, IgM 7. Patient was called back to further labs done. 08/10/2018, free light chain ratio showed extremely high level of kappa free light chain 10,183, with a kappa lambda light chain ratio of 1414.31 LDH 164 Beta-2 microglobulin 5 Patient was called and discuss about results.  He was recommended to undergo bone marrow biopsy and PET scan. 08/19/2018 bone marrow biopsy showed hypercellular marrow 80%, involved by plasma cell neoplasm up to 95%.  Consistent with plasma cell myeloma.  FISH and cytogenetics are pending.  09/10/2018 skeletal survey showed questionable small lucent lesions within the midshaft of the humerus bilaterally.  Otherwise no suspicious focal lytic lesion or acute bone  abnormality. 08/25/2018 PET scan showed no definite hypermetabolic bone disease but CT findings are highly suspicious for numerous small myelomatous lesions involving the spine, sternum and scattered ribs.   INTERVAL HISTORY Luis Burke is a 63 y.o. male who has above history reviewed by me today for follow-up as he developed acute lower back pain which radiates down to right leg, associated with right groin pain. Patient called cancer center and schedule appointment for follow-up. He also has contacted Dr. Damita Dunnings and was evaluated by him.  Was advised to use oxycodone 5 mg as needed for Tylenol as needed.  Groin pain is not felt to be related with testicular problems.  Today patient reports his pain has significantly improved after taking oxycodone.  Reports 5 out of 10 today. Denies any prior injury or fall.   Review of Systems  Constitutional: Negative for appetite change, chills, fatigue, fever and unexpected weight change.  HENT:   Negative for hearing loss and voice change.   Eyes: Negative for eye problems and icterus.  Respiratory: Negative for chest tightness, cough and shortness of breath.   Cardiovascular: Negative for chest pain and leg swelling.  Gastrointestinal: Negative for abdominal distention and abdominal pain.  Endocrine: Negative for hot flashes.  Genitourinary: Negative for difficulty urinating, dysuria and frequency.   Musculoskeletal: Negative for arthralgias.       Ankle swelling Acute lower back pain, shooting down the right leg.  Skin: Negative for itching and rash.  Neurological: Negative for light-headedness and numbness.  Hematological: Negative for adenopathy. Does not bruise/bleed easily.  Psychiatric/Behavioral: Negative for confusion.    MEDICAL HISTORY:  Past Medical History:  Diagnosis Date  . AKI (acute kidney injury) ([facility]) 01/06/2019  . Anemia   . Fatty liver    on u/s 07/2018  . Hyperlipidemia 03/2004  . Hypertension 1994  . Snores      SURGICAL HISTORY: Past Surgical History:  Procedure Laterality Date  . CATARACT EXTRACTION W/PHACO Right 03/04/2018   Procedure: CATARACT EXTRACTION PHACO AND INTRAOCULAR LENS PLACEMENT ([facility])  RIGHT TORIC;  Surgeon: Leandrew Koyanagi, MD;  Location: [facility];  Service: Ophthalmology;  Laterality: Right;  Per [place] no Toric Lens 1:45 5.3  . COLONOSCOPY WITH PROPOFOL N/A 07/20/2018   Procedure: COLONOSCOPY WITH PROPOFOL;  Surgeon: Jonathon Bellows, MD;  Location[facility] ENDOSCOPY;  Service: Gastroenterology;  Laterality: N/A;  . ESOPHAGOGASTRODUODENOSCOPY (EGD) WITH PROPOFOL N/A 07/20/2018   Procedure: ESOPHAGOGASTRODUODENOSCOPY (EGD) WITH PROPOFOL;  Surgeon: Jonathon Bellows, MD;  Location: [facility] ENDOSCOPY;  Service: Gastroenterology;  Laterality: N/A;  . EYE SURGERY Right   . HERNIA REPAIR    . L Lap herniorraphy  04/2000  . Left wrist ganglionectomy      SOCIAL HISTORY: Social History   Socioeconomic History  . Marital status: Single    Spouse name: Not on file  . Number of children: 1  . Years of education: Not on file  . Highest education level: Not on file  Occupational History  . Occupation: [employer]  Social Needs  . Financial resource strain: Not on file  . Food insecurity:    Worry: Not on file    Inability: Not on file  . Transportation needs:    Medical: Not on file    Non-medical: Not on file  Tobacco Use  . Smoking status: Never Smoker  . Smokeless tobacco: Never Used  . Tobacco comment: Occassionally  Substance and Sexual Activity  . Alcohol use: Not Currently    Alcohol/week: 3.0 standard drinks    Types: 3 Cans of beer per week  . Drug use: No  . Sexual activity: Not on file  Lifestyle  . Physical activity:    Days per week: Not on file    Minutes per session: Not on file  . Stress: Not on file  Relationships  . Social connections:    Talks on phone: Not on file    Gets together: Not on file    Attends religious service: Not on file    Active  member of club or organization: Not on file    Attends meetings of clubs or organizations: Not on file    Relationship status: Not on file  . Intimate partner violence:    Fear of current or ex partner: Not on file    Emotionally abused: Not on file    Physically abused: Not on file    Forced sexual activity: Not on file  Other Topics Concern  . Not on file  Social History Narrative   Divorced 23-Dec-2007, dating as of 2017-12-22   His daughter died 4 days after giving birth to the patient's granddaughter   Has joint custody of his dead daughter's child   Works at [employer] is [employer]    FAMILY HISTORY: Family History  Problem Relation Age of Onset  . Hypertension Mother   . Diabetes Mother   . Heart disease Mother  CAD  . Dementia Mother   . Hypertension Father   . Heart disease Father        MI 02/03  . Colon cancer Father   . Hypertension Sister   . Hypertension Sister   . Hypertension Sister   . Prostate cancer Neg Hx     ALLERGIES:  is allergic to bactrim [sulfamethoxazole-trimethoprim] and tadalafil.  MEDICATIONS:  Current Outpatient Medications  Medication Sig Dispense Refill  . acetaminophen (TYLENOL) 325 MG tablet Take 650 mg by mouth every 6 (six) hours as needed.    Marland Kitchen acyclovir (ZOVIRAX) 400 MG tablet Take 1 tablet (400 mg total) by mouth 2 (two) times daily. 60 tablet 3  . amLODipine (NORVASC) 5 MG tablet Take 2 tablets (10 mg total) by mouth daily.    Marland Kitchen co-enzyme Q-10 50 MG capsule Take 50 mg by mouth daily.    . [employer] VITAMIN B12 1000 MCG tablet TAKE 1 TABLET BY MOUTH EVERY DAY 90 tablet 1  . dexamethasone (DECADRON) 4 MG tablet Take 10 tablets (40 mg total) by mouth once a week. 30 tablet 0  . metoprolol succinate (TOPROL-XL) 50 MG 24 hr tablet Take 3 tablets (150 mg total) by mouth daily.    . cyclobenzaprine (FLEXERIL) 5 MG tablet Take 1 tablet (5 mg total) by mouth 3 (three) times daily as needed for muscle spasms. 30 tablet 0  . lenalidomide  (REVLIMID) 25 MG capsule TAKE 1 CAPSULE BY MOUTH EVERY DAY FOR 14 DAYS THEN HOLD FOR 7 DAYS, REPEAT EVERY 21 DAYS. 14 capsule 0   No current facility-administered medications for this visit.      PHYSICAL EXAMINATION: ECOG PERFORMANCE STATUS: 0 - Asymptomatic Vitals:   03/03/19 1347  BP: (!) 143/86  Pulse: 78  Temp: (!) 96 F (35.6 C)   Filed Weights   03/03/19 1347  Weight: 211 lb 9 oz (96 kg)    Physical Exam Constitutional:      General: He is not in acute distress. HENT:     Head: Normocephalic and atraumatic.  Eyes:     General: No scleral icterus.    Pupils: Pupils are equal, round, and reactive to light.  Neck:     Musculoskeletal: Normal range of motion and neck supple.  Cardiovascular:     Rate and Rhythm: Normal rate and regular rhythm.     Heart sounds: Normal heart sounds.  Pulmonary:     Effort: Pulmonary effort is normal. No respiratory distress.     Breath sounds: No wheezing.  Abdominal:     General: Bowel sounds are normal. There is no distension.     Palpations: Abdomen is soft. There is no mass.     Tenderness: There is no abdominal tenderness.  Musculoskeletal: Normal range of motion.        General: No deformity.     Comments: Trace edema at ankle level bilaterally.  Skin:    General: Skin is warm and dry.     Findings: No erythema or rash.  Neurological:     Mental Status: He is alert and oriented to person, place, and time.     Cranial Nerves: No cranial nerve deficit.     Coordination: Coordination normal.  Psychiatric:        Behavior: Behavior normal.        Thought Content: Thought content normal.      LABORATORY DATA:  I have reviewed the data as listed Lab Results  Component Value Date   WBC  7.4 03/03/2019   HGB 11.9 (L) 03/03/2019   HCT 34.4 (L) 03/03/2019   MCV 94.8 03/03/2019   PLT 97 (L) 03/03/2019   Recent Labs    02/10/19 1255 02/17/19 1318 03/03/19 1228  NA 139 141 135  K 3.7 4.5 4.1  CL 103 104 98  CO2 _0 GLUCOSE 164* 160* 172*  BUN 24* 22 22  CREATININE 1.66* 1.67* 1.62*  CALCIUM 9.3 9.5 9.0  GFRNONAA 44* 43* 45*  GFRAA 50* 50* 52*  PROT 7.2 7.5 7.1  ALBUMIN 4.3 4.4 4.2  AST _1 ALT _2 ALKPHOS 68 66 55  BILITOT 0.6 0.7 1.0   Iron/TIBC/Ferritin/ %Sat    Component Value Date/Time   IRON 74 07/01/2018 0944   TIBC 250 07/01/2018 0944   FERRITIN 357 07/01/2018 0944   IRONPCTSAT 30 07/01/2018 0944    Lab Results  Component Value Date   TOTALPROTELP 6.3 02/10/2019   Lab Results  Component Value Date   KPAFRELGTCHN 74.0 (H) 02/10/2019   LAMBDASER 18.5 02/10/2019   KAPLAMBRATIO 4.00 (H) 02/10/2019    RADIOGRAPHIC STUDIES: I have personally reviewed the radiological images as listed and agreed with the findings in the report. 10/23/2018 FINDINGS: The heart size and mediastinal contours are within normal limits. Both lungs are clear. The visualized skeletal structures are unremarkable. IMPRESSION: No active cardiopulmonary disease.   ASSESSMENT & PLAN:  1. Acute midline low back pain with right-sided sciatica   2. Multiple myeloma not having achieved remission (HCC)    #Light chain multiple myeloma beta 2 microglobulin 5 and normal albumin.  Stage II. cytogenetics showed normal male chromosome, MDS FISH panel showed trisomy 24- Standard Risk. Labs are reviewed and discussed with patient. Proceed with Velcade.   Velcade 1.35m/m2 Day 1, 8, 15 Revlimid 239mdaily Day 1-14. [  Start at Cycle 5 as GFR>60 ]. Dexamethasone [id number] Reviewed patient's dexamethasone prescription.  Continue acyclovir 400 mg twice daily as well as Acyclovir 81 mg daily.  #Acute sciatic pain, recommend patient to continue use oxycodone as instructed. He may also use Tylenol as needed. Discussed with patient about avoiding NSAIDs.  He does not have bone metastasis prior to the initiation of chemotherapy for multiple myeloma and his multiple myeloma panel has responded  very well to the treatment.  He will be unusual to develop bone metastasis.    if pain persists, I will recommend obtaining MRI of lumbar spine.  RTC: 1 week.    ZhoEarlie ServerD, PhD Hematology Oncology [facility] [id number]

## 2019-03-08 ENCOUNTER — Telehealth: Payer: Self-pay | Admitting: Family Medicine

## 2019-03-08 NOTE — Telephone Encounter (Signed)
Please check on patient to make sure his back pain is getting better in the meantime.  If he continues to have significant pain requiring medication then let me know.  If he is having recurrent pain we may need to see him in the clinic and/or get imaging done.  Thanks.

## 2019-03-09 ENCOUNTER — Other Ambulatory Visit: Payer: Self-pay

## 2019-03-09 NOTE — Telephone Encounter (Signed)
Patient says his back pain is significantly better with the muscle relaxers and he appreciates Dr. Josefine Class help.  He sees his cancer MD again tomorrow.

## 2019-03-09 NOTE — Telephone Encounter (Signed)
Left detailed message on voicemail asking patient to call in with update.

## 2019-03-09 NOTE — Telephone Encounter (Signed)
Noted. Thanks.  I'll defer. Glad he is better.

## 2019-03-10 ENCOUNTER — Encounter: Payer: Self-pay | Admitting: Oncology

## 2019-03-10 ENCOUNTER — Inpatient Hospital Stay (HOSPITAL_BASED_OUTPATIENT_CLINIC_OR_DEPARTMENT_OTHER): Payer: BC Managed Care – PPO | Admitting: Oncology

## 2019-03-10 ENCOUNTER — Other Ambulatory Visit: Payer: Self-pay

## 2019-03-10 ENCOUNTER — Encounter: Payer: Self-pay | Admitting: *Deleted

## 2019-03-10 ENCOUNTER — Inpatient Hospital Stay: Payer: BC Managed Care – PPO

## 2019-03-10 VITALS — BP 159/90 | HR 76 | Temp 97.4°F | Resp 18 | Wt 213.0 lb

## 2019-03-10 DIAGNOSIS — C9000 Multiple myeloma not having achieved remission: Secondary | ICD-10-CM

## 2019-03-10 DIAGNOSIS — M5441 Lumbago with sciatica, right side: Secondary | ICD-10-CM

## 2019-03-10 DIAGNOSIS — Z79899 Other long term (current) drug therapy: Secondary | ICD-10-CM

## 2019-03-10 DIAGNOSIS — D649 Anemia, unspecified: Secondary | ICD-10-CM

## 2019-03-10 DIAGNOSIS — D696 Thrombocytopenia, unspecified: Secondary | ICD-10-CM

## 2019-03-10 DIAGNOSIS — D6481 Anemia due to antineoplastic chemotherapy: Secondary | ICD-10-CM | POA: Diagnosis not present

## 2019-03-10 DIAGNOSIS — Z5111 Encounter for antineoplastic chemotherapy: Secondary | ICD-10-CM

## 2019-03-10 DIAGNOSIS — Z5112 Encounter for antineoplastic immunotherapy: Secondary | ICD-10-CM | POA: Diagnosis not present

## 2019-03-10 DIAGNOSIS — D6959 Other secondary thrombocytopenia: Secondary | ICD-10-CM | POA: Diagnosis not present

## 2019-03-10 DIAGNOSIS — T451X5A Adverse effect of antineoplastic and immunosuppressive drugs, initial encounter: Secondary | ICD-10-CM | POA: Diagnosis not present

## 2019-03-10 LAB — CBC WITH DIFFERENTIAL/PLATELET
Abs Immature Granulocytes: 0.02 10*3/uL (ref 0.00–0.07)
Basophils Absolute: 0 10*3/uL (ref 0.0–0.1)
Basophils Relative: 1 %
Eosinophils Absolute: 0.1 10*3/uL (ref 0.0–0.5)
Eosinophils Relative: 1 %
HCT: 32.4 % — ABNORMAL LOW (ref 39.0–52.0)
Hemoglobin: 11.2 g/dL — ABNORMAL LOW (ref 13.0–17.0)
Immature Granulocytes: 1 %
Lymphocytes Relative: 14 %
Lymphs Abs: 0.6 10*3/uL — ABNORMAL LOW (ref 0.7–4.0)
MCH: 32.3 pg (ref 26.0–34.0)
MCHC: 34.6 g/dL (ref 30.0–36.0)
MCV: 93.4 fL (ref 80.0–100.0)
Monocytes Absolute: 0.3 10*3/uL (ref 0.1–1.0)
Monocytes Relative: 6 %
Neutro Abs: 3.4 10*3/uL (ref 1.7–7.7)
Neutrophils Relative %: 77 %
Platelets: 112 10*3/uL — ABNORMAL LOW (ref 150–400)
RBC: 3.47 MIL/uL — ABNORMAL LOW (ref 4.22–5.81)
RDW: 15.5 % (ref 11.5–15.5)
WBC: 4.4 10*3/uL (ref 4.0–10.5)
nRBC: 0 % (ref 0.0–0.2)

## 2019-03-10 LAB — COMPREHENSIVE METABOLIC PANEL
ALT: 37 U/L (ref 0–44)
AST: 21 U/L (ref 15–41)
Albumin: 4.3 g/dL (ref 3.5–5.0)
Alkaline Phosphatase: 64 U/L (ref 38–126)
Anion gap: 9 (ref 5–15)
BUN: 26 mg/dL — ABNORMAL HIGH (ref 8–23)
CO2: 22 mmol/L (ref 22–32)
Calcium: 9.2 mg/dL (ref 8.9–10.3)
Chloride: 107 mmol/L (ref 98–111)
Creatinine, Ser: 1.3 mg/dL — ABNORMAL HIGH (ref 0.61–1.24)
GFR calc Af Amer: >60 mL/min (ref >60)
GFR calc non Af Amer: 58 mL/min — ABNORMAL LOW (ref >60)
Glucose, Bld: 119 mg/dL — ABNORMAL HIGH (ref 70–99)
Potassium: 4 mmol/L (ref 3.5–5.1)
Sodium: 138 mmol/L (ref 135–145)
Total Bilirubin: 0.7 mg/dL (ref 0.3–1.2)
Total Protein: 7.1 g/dL (ref 6.5–8.1)

## 2019-03-10 MED ORDER — BORTEZOMIB CHEMO SQ INJECTION 3.5 MG (2.5MG/ML)
1.3000 mg/m2 | Freq: Once | INTRAMUSCULAR | Status: AC
  Administered 2019-03-10: 2.75 mg via SUBCUTANEOUS
  Filled 2019-03-10: qty 1.1

## 2019-03-10 MED ORDER — PROCHLORPERAZINE MALEATE 10 MG PO TABS
10.0000 mg | ORAL_TABLET | Freq: Once | ORAL | Status: AC
  Administered 2019-03-10: 10 mg via ORAL
  Filled 2019-03-10: qty 1

## 2019-03-10 NOTE — Research (Signed)
I met with patient today during his infusion appointment to further explain research study [employer] [phone] CD "Assessing Financial Difficulty in Patients with Blood Cancers."  Dr. Earlie Server introduced study to patient during televisit earlier today.  I explained to patient that if he chose to enroll in study he would receive a phone call from [place] study team who would conduct a telephone survey (for approximately 1 hour) regarding his finances in relation to his blood cancer diagnosis.  The study will provide him a gift card via mail to thank him for participation.  I reviewed consent form with patient including voluntary participation, risks/benefits to enrolling in study and purpose of study.  Patient had the opportunity to ask questions, then agreed to participate.  He signed consent form that was IRB approved 01/28/2019 and signed internal release of information form.  Patient was given a copy of all documents he signed along with my business card and contact information for lead research nurse, Yolande Jolly.  I thanked patient for his participation.  We also discussed his upcoming bone marrow transplant at [facility]  He is scheduled for inpatient admission April 14, 2019 and will be hospitalized for at least one month. He will begin pre admission appointments at [facility] around June 11.  Request will be made with study to try to schedule telephone survey prior to bone marrow transplant activities.  Dr. Tasia Catchings was notified patient consented.   Lula Olszewski Oncology research associate 03/10/2019 02:28 PM

## 2019-03-10 NOTE — Progress Notes (Signed)
Called patient today for Telehealth visit via [place].  Patient states no new concerns today. Patient states that back/leg pain from last week has resolved.

## 2019-03-10 NOTE — Progress Notes (Signed)
HEMATOLOGY-ONCOLOGY TeleHEALTH VISIT PROGRESS NOTE  I connected with Luis Burke on 03/10/19 at  9:00 AM EDT by video enabled telemedicine visit and verified that I am speaking with the correct person using two identifiers. I discussed the limitations, risks, security and privacy concerns of performing an evaluation and management service by telemedicine and the availability of in-person appointments. I also discussed with the patient that there may be a patient responsible charge related to this service. The patient expressed understanding and agreed to proceed.   Other persons participating in the visit and their role in the encounter:  Geraldine Solar, CMA, check in patient     Patient's location: Home  Provider's location:work  Chief Complaint: Follow-up for evaluation prior to chemotherapy for treatment of multiple myeloma.   INTERVAL HISTORY Luis Burke is a 63 y.o. male who has above history reviewed by me today presents for follow up evaluation of chemotherapy for Patient has been on multiple myeloma treatment with RVD.  Tolerating well. Last week he has developed sciatic pain which has improved now. Pain is improved with taking oxycodone.  visit for evaluation prior to chemotherapy for treatment of light chain multiple myeloma. Chronic lower extremity ankle level swelling, stable.  Previous work-up negative for thrombosis.  Patient is taking aspirin and acyclovir.  Review of Systems  Constitutional: Positive for fatigue. Negative for appetite change, chills, fever and unexpected weight change.  HENT:   Negative for hearing loss and voice change.   Eyes: Negative for eye problems and icterus.  Respiratory: Negative for chest tightness, cough and shortness of breath.   Cardiovascular: Positive for leg swelling. Negative for chest pain.  Gastrointestinal: Negative for abdominal distention, abdominal pain and blood in stool.  Endocrine: Negative for hot flashes.   Genitourinary: Negative for difficulty urinating, dysuria and frequency.   Musculoskeletal: Negative for arthralgias.  Skin: Negative for itching and rash.  Neurological: Negative for extremity weakness, light-headedness and numbness.  Hematological: Negative for adenopathy. Does not bruise/bleed easily.  Psychiatric/Behavioral: Negative for confusion.    Past Medical History:  Diagnosis Date  . AKI (acute kidney injury) ([place]) 01/06/2019  . Anemia   . Fatty liver    on u/s 07/2018  . Hyperlipidemia 03/2004  . Hypertension 1994  . Snores    Past Surgical History:  Procedure Laterality Date  . CATARACT EXTRACTION W/PHACO Right 03/04/2018   Procedure: CATARACT EXTRACTION PHACO AND INTRAOCULAR LENS PLACEMENT ([place])  RIGHT TORIC;  Surgeon: Leandrew Koyanagi, MD;  Location: [place];  Service: Ophthalmology;  Laterality: Right;  Per [place] no Toric Lens 1:45 5.3  . COLONOSCOPY WITH PROPOFOL N/A 07/20/2018   Procedure: COLONOSCOPY WITH PROPOFOL;  Surgeon: Jonathon Bellows, MD;  Location: [facility] ENDOSCOPY;  Service: Gastroenterology;  Laterality: N/A;  . ESOPHAGOGASTRODUODENOSCOPY (EGD) WITH PROPOFOL N/A 07/20/2018   Procedure: ESOPHAGOGASTRODUODENOSCOPY (EGD) WITH PROPOFOL;  Surgeon: Jonathon Bellows, MD;  Location: [facility] ENDOSCOPY;  Service: Gastroenterology;  Laterality: N/A;  . EYE SURGERY Right   . HERNIA REPAIR    . L Lap herniorraphy  04/2000  . Left wrist ganglionectomy      Family History  Problem Relation Age of Onset  . Hypertension Mother   . Diabetes Mother   . Heart disease Mother        CAD  . Dementia Mother   . Hypertension Father   . Heart disease Father        MI 02/03  . Colon cancer Father   . Hypertension Sister   .  Hypertension Sister   . Hypertension Sister   . Prostate cancer Neg Hx     Social History   Socioeconomic History  . Marital status: Single    Spouse name: Not on file  . Number of children: 1  . Years of education: Not on file  . Highest  education level: Not on file  Occupational History  . Occupation: [employer]  Social Needs  . Financial resource strain: Not on file  . Food insecurity:    Worry: Not on file    Inability: Not on file  . Transportation needs:    Medical: Not on file    Non-medical: Not on file  Tobacco Use  . Smoking status: Never Smoker  . Smokeless tobacco: Never Used  . Tobacco comment: Occassionally  Substance and Sexual Activity  . Alcohol use: Not Currently    Alcohol/week: 3.0 standard drinks    Types: 3 Cans of beer per week  . Drug use: No  . Sexual activity: Not on file  Lifestyle  . Physical activity:    Days per week: Not on file    Minutes per session: Not on file  . Stress: Not on file  Relationships  . Social connections:    Talks on phone: Not on file    Gets together: Not on file    Attends religious service: Not on file    Active member of club or organization: Not on file    Attends meetings of clubs or organizations: Not on file    Relationship status: Not on file  . Intimate partner violence:    Fear of current or ex partner: Not on file    Emotionally abused: Not on file    Physically abused: Not on file    Forced sexual activity: Not on file  Other Topics Concern  . Not on file  Social History Narrative   Divorced 16-Dec-2007, dating as of 2017/12/15   His daughter died 4 days after giving birth to the patient's granddaughter   Has joint custody of his dead daughter's child   Works at [employer] is [employer]    Current Outpatient Medications on File Prior to Visit  Medication Sig Dispense Refill  . acetaminophen (TYLENOL) 325 MG tablet Take 650 mg by mouth every 6 (six) hours as needed.    Marland Kitchen acyclovir (ZOVIRAX) 400 MG tablet Take 1 tablet (400 mg total) by mouth 2 (two) times daily. 60 tablet 3  . amLODipine (NORVASC) 5 MG tablet Take 2 tablets (10 mg total) by mouth daily.    Marland Kitchen co-enzyme Q-10 50 MG capsule Take 50 mg by mouth daily.    . [employer] VITAMIN B12 1000 MCG  tablet TAKE 1 TABLET BY MOUTH EVERY DAY 90 tablet 1  . cyclobenzaprine (FLEXERIL) 5 MG tablet Take 1 tablet (5 mg total) by mouth 3 (three) times daily as needed for muscle spasms. 30 tablet 0  . dexamethasone (DECADRON) 4 MG tablet Take 10 tablets (40 mg total) by mouth once a week. 30 tablet 0  . lenalidomide (REVLIMID) 25 MG capsule TAKE 1 CAPSULE BY MOUTH EVERY DAY FOR 14 DAYS THEN HOLD FOR 7 DAYS, REPEAT EVERY 21 DAYS. 14 capsule 0  . metoprolol succinate (TOPROL-XL) 50 MG 24 hr tablet Take 3 tablets (150 mg total) by mouth daily.     No current facility-administered medications on file prior to visit.     Allergies  Allergen Reactions  . Bactrim [Sulfamethoxazole-Trimethoprim]     Elevated  creatinine  . Tadalafil     Intolerant,  Didn't feel well on med       Observations/Objective: Today's Vitals   03/10/19 0906  PainSc: 0-No pain   There is no height or weight on file to calculate BMI.  Pain level 0 home. Physical Exam  Constitutional: He is oriented to person, place, and time and well-developed, well-nourished, and in no distress. No distress.  HENT:  Head: Normocephalic and atraumatic.  Neck: Normal range of motion.  Pulmonary/Chest: Effort normal.  Neurological: He is alert and oriented to person, place, and time.  Psychiatric: Affect normal.   CBC    Component Value Date/Time   WBC 4.4 03/10/2019 1306   RBC 3.47 (L) 03/10/2019 1306   HGB 11.2 (L) 03/10/2019 1306   HCT 32.4 (L) 03/10/2019 1306   PLT 112 (L) 03/10/2019 1306   MCV 93.4 03/10/2019 1306   MCH 32.3 03/10/2019 1306   MCHC 34.6 03/10/2019 1306   RDW 15.5 03/10/2019 1306   LYMPHSABS 0.6 (L) 03/10/2019 1306   MONOABS 0.3 03/10/2019 1306   EOSABS 0.1 03/10/2019 1306   BASOSABS 0.0 03/10/2019 1306    CMP     Component Value Date/Time   NA 138 03/10/2019 1306   K 4.0 03/10/2019 1306   CL 107 03/10/2019 1306   CO2 22 03/10/2019 1306   GLUCOSE 119 (H) 03/10/2019 1306   BUN 26 (H) 03/10/2019 1306    CREATININE 1.30 (H) 03/10/2019 1306   CALCIUM 9.2 03/10/2019 1306   PROT 7.1 03/10/2019 1306   ALBUMIN 4.3 03/10/2019 1306   AST 21 03/10/2019 1306   ALT 37 03/10/2019 1306   ALKPHOS 64 03/10/2019 1306   BILITOT 0.7 03/10/2019 1306   GFRNONAA 58 (L) 03/10/2019 1306   GFRAA >60 03/10/2019 1306     Assessment and Plan: 1. Acute midline low back pain with right-sided sciatica   2. Encounter for antineoplastic chemotherapy   3. Thrombocytopenia ([place])   4. Light chain myeloma ([facility])   Stage II light chain multiple myeloma- cytogenetics showed normal male chromosome, MDS FISH panel showed trisomy 64- Standard Risk 02/10/2019 after 8 cycles of RVD, multiple myeloma panel was reviewed and discussed with patient.   He has had a very good response, difference between involved and uninvolved light chain level decreased more than 90%. Kappa light chain has reduced to 74 and kappa lambda light chain ratio reduced to 4. Today's multiple myeloma panel is pending.   Labs are reviewed, counts are ok with proceeding cycle 10 RVD.  He will proceed with day 8 Velcade on 03/17/2019, Labs done 03/03/2019, proceed to day 15 Velcade on 6/1`0/2020.  # Thrombocytopenia, due to chemotherapy. Grade 1, monitor.  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  Continue to monitor. # Acute siatic pain, improved. conitnue to monitor. If no improvement, will proceed with MRI lumbar.   Pending bone marrow [facility] at [facility]   Follow Up Instructions: Follow-up in 3 weeks for lab and MD assessment prior to cycle 11 RVD.  I discussed the assessment and treatment plan with the patient. The patient was provided an opportunity to ask questions and all were answered. The patient agreed with the plan and demonstrated an understanding of the instructions.  The patient was advised to call back or seek an in-person evaluation if the symptoms worsen or if the condition fails to improve as anticipated.    Earlie Server, MD 03/10/2019  10:41 PM

## 2019-03-11 LAB — PROTEIN ELECTROPHORESIS, SERUM
A/G Ratio: 1.3 (ref 0.7–1.7)
Albumin ELP: 3.7 g/dL (ref 2.9–4.4)
Alpha-1-Globulin: 0.3 g/dL (ref 0.0–0.4)
Alpha-2-Globulin: 0.8 g/dL (ref 0.4–1.0)
Beta Globulin: 1.3 g/dL (ref 0.7–1.3)
Gamma Globulin: 0.6 g/dL (ref 0.4–1.8)
Globulin, Total: 2.9 g/dL (ref 2.2–3.9)
Total Protein ELP: 6.6 g/dL (ref 6.0–8.5)

## 2019-03-11 LAB — KAPPA/LAMBDA LIGHT CHAINS
Kappa free light chain: 64.1 mg/L — ABNORMAL HIGH (ref 3.3–19.4)
Kappa, lambda light chain ratio: 4.51 — ABNORMAL HIGH (ref 0.26–1.65)
Lambda free light chains: 14.2 mg/L (ref 5.7–26.3)

## 2019-03-11 NOTE — Research (Signed)
Patient Luis Burke. Luis Burke was consented to the [id number] CD study this afternoon by Lula Olszewski after the study was introduced and reviewed by Dr. Tasia Catchings during her virtual visit this morning. Patient had expressed interest in the study and was given a copy of the consent to review prior to providing written consent. Eligibility for the study was originally checked by Jeral Fruit, RN. Second eligibility review was performed by myself and the patient was found to meet all of the eligibiity and none of the ineligibility criteria. He is currently actively being treated for Multiple Myeloma on a  Velcade/Revlimid/Dexamethasone regimen, but he will begin preparation for ASCT next month. [name] was registered to the [id number] CD study this afternoon and data extraction from his medical record was completed. His contact information will be submitted to the study for purpose of completing the required phone questionnaire only. The patient will also be approached about completing the DCP-001 study when he is in clinic next. Yolande Jolly, BSN, MHA, OCN 03/10/2019 4:55 PM

## 2019-03-12 ENCOUNTER — Telehealth: Payer: Self-pay | Admitting: *Deleted

## 2019-03-12 ENCOUNTER — Telehealth: Payer: Self-pay

## 2019-03-12 NOTE — Telephone Encounter (Signed)
Received call from Ruben Reason, transplant coordinator at [facility]. Per Loree Fee, patient is to proceed with Velcade scheduled on 6/3 and then stop. She is aware that patient has just started Revlimid cycle and that is why she has pushed collection date back to the end of June. Dr Tasia Catchings aware and patient aware. will cancel appts on 6/10 and 6/17.

## 2019-03-12 NOTE — Telephone Encounter (Signed)
Per Luis Burke 03/12/19 scheduling message to: Cancel appt on 6/10 and 6/17. KEEP velcade appt on 6/3. Pt aware, no need to call him. No follow up needed with Dr. Tasia Catchings after that. Appts for 6/10 lab/Infusion and 03/31/19 lab/MD/Infusion were cancelled as requested.

## 2019-03-17 ENCOUNTER — Inpatient Hospital Stay: Payer: BC Managed Care – PPO | Attending: Oncology

## 2019-03-17 ENCOUNTER — Other Ambulatory Visit: Payer: Self-pay

## 2019-03-17 VITALS — BP 184/94 | HR 96 | Temp 96.9°F | Resp 20

## 2019-03-17 DIAGNOSIS — C9000 Multiple myeloma not having achieved remission: Secondary | ICD-10-CM | POA: Diagnosis not present

## 2019-03-17 DIAGNOSIS — Z5112 Encounter for antineoplastic immunotherapy: Secondary | ICD-10-CM | POA: Diagnosis not present

## 2019-03-17 MED ORDER — BORTEZOMIB CHEMO SQ INJECTION 3.5 MG (2.5MG/ML)
1.3000 mg/m2 | Freq: Once | INTRAMUSCULAR | Status: AC
  Administered 2019-03-17: 2.75 mg via SUBCUTANEOUS
  Filled 2019-03-17: qty 1.1

## 2019-03-17 MED ORDER — PROCHLORPERAZINE MALEATE 10 MG PO TABS
10.0000 mg | ORAL_TABLET | Freq: Once | ORAL | Status: AC
  Administered 2019-03-17: 14:00:00 10 mg via ORAL
  Filled 2019-03-17: qty 1

## 2019-03-23 ENCOUNTER — Encounter: Payer: Self-pay | Admitting: *Deleted

## 2019-03-23 NOTE — Research (Signed)
T/C made to patient Luis Burke to determine whether he is willing to participate in the DCP-001 research study as companion to the [id number] CD study he was previously enrolled to. Mr. Worlds states he received a phone call from [facility] yesterday for the questionnaire portion of the study, but states he stopped the questions after about 10 minutes because he "didn't like the directions they were going." He did not elaborate on the types of questions he was asked, but he also declined to participate in the DCP-001 study. Patient was thanked for his time and willingness to participate in research. He agreed that we may contact him in the future if other studies become available that he could be eligible for. Yolande Jolly, BSN, MHA, OCN 03/23/2019 11:03 AM

## 2019-03-24 ENCOUNTER — Other Ambulatory Visit: Payer: BLUE CROSS/BLUE SHIELD

## 2019-03-24 ENCOUNTER — Ambulatory Visit: Payer: BLUE CROSS/BLUE SHIELD

## 2019-03-25 DIAGNOSIS — I517 Cardiomegaly: Secondary | ICD-10-CM | POA: Diagnosis not present

## 2019-03-25 DIAGNOSIS — C9000 Multiple myeloma not having achieved remission: Secondary | ICD-10-CM | POA: Diagnosis not present

## 2019-03-25 DIAGNOSIS — Z79899 Other long term (current) drug therapy: Secondary | ICD-10-CM | POA: Diagnosis not present

## 2019-03-25 DIAGNOSIS — Z01818 Encounter for other preprocedural examination: Secondary | ICD-10-CM | POA: Diagnosis not present

## 2019-03-26 DIAGNOSIS — C9000 Multiple myeloma not having achieved remission: Secondary | ICD-10-CM | POA: Diagnosis not present

## 2019-03-31 ENCOUNTER — Ambulatory Visit: Payer: BLUE CROSS/BLUE SHIELD | Admitting: Oncology

## 2019-03-31 ENCOUNTER — Ambulatory Visit: Payer: BLUE CROSS/BLUE SHIELD

## 2019-03-31 ENCOUNTER — Other Ambulatory Visit: Payer: BLUE CROSS/BLUE SHIELD

## 2019-04-05 ENCOUNTER — Telehealth: Payer: Self-pay | Admitting: *Deleted

## 2019-04-05 NOTE — Telephone Encounter (Signed)
Pt calling to ask if COVID-19 testing needs to be referred by the doctor. Pt advised that COVID-19 testing is ordered once referral is placed by the doctor. Pt states he is currently being seen by a physician in [place] and will be undergoing a bone marrow transplant next week due to a history of multiple myeloma. Pt states he was notified that he would need to have 2 COVID-19 test done prior to the procedure.One test would be performed at [facility] and the other one in [place] per the patient.Pt advised that if provider from [place] calls to request testing that order can be placed. Pt verbalized understanding. Pt also advised that information would also be forwarded to PCP if [facility] requires PCP to order testing. Pt states he will return call tomorrow once he speaks with his physician at [facility].

## 2019-04-06 ENCOUNTER — Telehealth: Payer: Self-pay | Admitting: *Deleted

## 2019-04-06 ENCOUNTER — Other Ambulatory Visit: Payer: BC Managed Care – PPO

## 2019-04-06 ENCOUNTER — Other Ambulatory Visit: Payer: Self-pay | Admitting: *Deleted

## 2019-04-06 DIAGNOSIS — C9000 Multiple myeloma not having achieved remission: Secondary | ICD-10-CM

## 2019-04-06 DIAGNOSIS — R6889 Other general symptoms and signs: Secondary | ICD-10-CM | POA: Diagnosis not present

## 2019-04-06 DIAGNOSIS — Z20822 Contact with and (suspected) exposure to covid-19: Secondary | ICD-10-CM

## 2019-04-06 NOTE — Telephone Encounter (Signed)
Patient had COVID testing done today (6/23).

## 2019-04-06 NOTE — Telephone Encounter (Signed)
-----   Message from Josetta Huddle, [place] sent at 04/06/2019  9:26 AM EDT ----- Patient requires Covid-19 testing due to upcoming bone marrow transplant at [facility].

## 2019-04-06 NOTE — Telephone Encounter (Signed)
Please schedule the COVID test... please reply back with date of test so I can put the order in

## 2019-04-06 NOTE — Telephone Encounter (Signed)
Thanks for sending to [facility] pool for testing arrangements.  Will await results.

## 2019-04-06 NOTE — Telephone Encounter (Signed)
Please check with patient.  If [facility] clinic has not already ordered the test, then please refer him for covid testing.  Thanks.

## 2019-04-06 NOTE — Telephone Encounter (Signed)
Message sent to [place].  Patient says he left 2 messages at [facility] yesterday and another today with no response so he would like to get the testing through PCP if possible.

## 2019-04-06 NOTE — Telephone Encounter (Signed)
Whitney with [facility] called stating this patient needs COVID testing done in the next few days in preparation for Stem cell transplant. She asks that she be called when this is scheduled so that she can follow up on his testing 2 days prior to transplant. [phone]

## 2019-04-06 NOTE — Telephone Encounter (Signed)
Per Almyra Free to R/S COVID-19  test from 04/06/19  Test was r/s as requested..Scheduled date 04/07/19 @ 10:45 A detailed message was left on patient's vmail to make him aware of the scheduled Date, Time And Location.

## 2019-04-06 NOTE — Telephone Encounter (Signed)
Spoke with patient, scheduled him for COVID 19 test this morning at 10 am at [facility] building.  Testing protocol reviewed with patient, he expressed understanding.

## 2019-04-07 ENCOUNTER — Other Ambulatory Visit: Payer: BC Managed Care – PPO

## 2019-04-10 LAB — NOVEL CORONAVIRUS, NAA: SARS-CoV-2, NAA: NOT DETECTED

## 2019-04-12 DIAGNOSIS — C9000 Multiple myeloma not having achieved remission: Secondary | ICD-10-CM | POA: Diagnosis not present

## 2019-04-12 DIAGNOSIS — Z452 Encounter for adjustment and management of vascular access device: Secondary | ICD-10-CM | POA: Diagnosis not present

## 2019-04-13 DIAGNOSIS — C9000 Multiple myeloma not having achieved remission: Secondary | ICD-10-CM | POA: Diagnosis not present

## 2019-04-14 ENCOUNTER — Telehealth: Payer: Self-pay | Admitting: *Deleted

## 2019-04-14 DIAGNOSIS — C9000 Multiple myeloma not having achieved remission: Secondary | ICD-10-CM | POA: Diagnosis not present

## 2019-04-14 NOTE — Telephone Encounter (Signed)
Patient returned call- he has seen results on MyChart- verified negative. Patient advised to call PCP if he has any changes or symptoms related to COVID.

## 2019-04-20 DIAGNOSIS — N189 Chronic kidney disease, unspecified: Secondary | ICD-10-CM | POA: Diagnosis not present

## 2019-04-20 DIAGNOSIS — R112 Nausea with vomiting, unspecified: Secondary | ICD-10-CM | POA: Diagnosis not present

## 2019-04-20 DIAGNOSIS — Z01818 Encounter for other preprocedural examination: Secondary | ICD-10-CM | POA: Diagnosis not present

## 2019-04-20 DIAGNOSIS — C9000 Multiple myeloma not having achieved remission: Secondary | ICD-10-CM | POA: Diagnosis not present

## 2019-04-20 DIAGNOSIS — T451X5A Adverse effect of antineoplastic and immunosuppressive drugs, initial encounter: Secondary | ICD-10-CM | POA: Diagnosis not present

## 2019-04-20 DIAGNOSIS — Z1159 Encounter for screening for other viral diseases: Secondary | ICD-10-CM | POA: Diagnosis not present

## 2019-04-21 DIAGNOSIS — R609 Edema, unspecified: Secondary | ICD-10-CM | POA: Diagnosis not present

## 2019-04-21 DIAGNOSIS — R918 Other nonspecific abnormal finding of lung field: Secondary | ICD-10-CM | POA: Diagnosis not present

## 2019-04-21 DIAGNOSIS — R112 Nausea with vomiting, unspecified: Secondary | ICD-10-CM | POA: Diagnosis not present

## 2019-04-21 DIAGNOSIS — R5383 Other fatigue: Secondary | ICD-10-CM | POA: Diagnosis not present

## 2019-04-21 DIAGNOSIS — Z713 Dietary counseling and surveillance: Secondary | ICD-10-CM | POA: Diagnosis not present

## 2019-04-21 DIAGNOSIS — B372 Candidiasis of skin and nail: Secondary | ICD-10-CM | POA: Diagnosis not present

## 2019-04-21 DIAGNOSIS — G4733 Obstructive sleep apnea (adult) (pediatric): Secondary | ICD-10-CM | POA: Diagnosis not present

## 2019-04-21 DIAGNOSIS — K219 Gastro-esophageal reflux disease without esophagitis: Secondary | ICD-10-CM | POA: Diagnosis not present

## 2019-04-21 DIAGNOSIS — Z9481 Bone marrow transplant status: Secondary | ICD-10-CM | POA: Diagnosis not present

## 2019-04-21 DIAGNOSIS — E876 Hypokalemia: Secondary | ICD-10-CM | POA: Diagnosis not present

## 2019-04-21 DIAGNOSIS — Z5111 Encounter for antineoplastic chemotherapy: Secondary | ICD-10-CM | POA: Diagnosis not present

## 2019-04-21 DIAGNOSIS — I129 Hypertensive chronic kidney disease with stage 1 through stage 4 chronic kidney disease, or unspecified chronic kidney disease: Secondary | ICD-10-CM | POA: Diagnosis not present

## 2019-04-21 DIAGNOSIS — T451X5A Adverse effect of antineoplastic and immunosuppressive drugs, initial encounter: Secondary | ICD-10-CM | POA: Diagnosis not present

## 2019-04-21 DIAGNOSIS — Z95828 Presence of other vascular implants and grafts: Secondary | ICD-10-CM | POA: Diagnosis not present

## 2019-04-21 DIAGNOSIS — D649 Anemia, unspecified: Secondary | ICD-10-CM | POA: Diagnosis not present

## 2019-04-21 DIAGNOSIS — Z452 Encounter for adjustment and management of vascular access device: Secondary | ICD-10-CM | POA: Diagnosis not present

## 2019-04-21 DIAGNOSIS — D696 Thrombocytopenia, unspecified: Secondary | ICD-10-CM | POA: Diagnosis not present

## 2019-04-21 DIAGNOSIS — D61810 Antineoplastic chemotherapy induced pancytopenia: Secondary | ICD-10-CM | POA: Diagnosis not present

## 2019-04-21 DIAGNOSIS — Z9484 Stem cells transplant status: Secondary | ICD-10-CM | POA: Diagnosis not present

## 2019-04-21 DIAGNOSIS — Z01818 Encounter for other preprocedural examination: Secondary | ICD-10-CM | POA: Diagnosis not present

## 2019-04-21 DIAGNOSIS — N189 Chronic kidney disease, unspecified: Secondary | ICD-10-CM | POA: Diagnosis not present

## 2019-04-21 DIAGNOSIS — N179 Acute kidney failure, unspecified: Secondary | ICD-10-CM | POA: Diagnosis not present

## 2019-04-21 DIAGNOSIS — R197 Diarrhea, unspecified: Secondary | ICD-10-CM | POA: Diagnosis not present

## 2019-04-21 DIAGNOSIS — R1030 Lower abdominal pain, unspecified: Secondary | ICD-10-CM | POA: Diagnosis not present

## 2019-04-21 DIAGNOSIS — D709 Neutropenia, unspecified: Secondary | ICD-10-CM | POA: Diagnosis not present

## 2019-04-21 DIAGNOSIS — N183 Chronic kidney disease, stage 3 (moderate): Secondary | ICD-10-CM | POA: Diagnosis not present

## 2019-04-21 DIAGNOSIS — M25512 Pain in left shoulder: Secondary | ICD-10-CM | POA: Diagnosis not present

## 2019-04-21 DIAGNOSIS — R5081 Fever presenting with conditions classified elsewhere: Secondary | ICD-10-CM | POA: Diagnosis not present

## 2019-04-21 DIAGNOSIS — E785 Hyperlipidemia, unspecified: Secondary | ICD-10-CM | POA: Diagnosis not present

## 2019-04-21 DIAGNOSIS — Z79899 Other long term (current) drug therapy: Secondary | ICD-10-CM | POA: Diagnosis not present

## 2019-04-21 DIAGNOSIS — R600 Localized edema: Secondary | ICD-10-CM | POA: Diagnosis not present

## 2019-04-21 DIAGNOSIS — C9000 Multiple myeloma not having achieved remission: Secondary | ICD-10-CM | POA: Diagnosis not present

## 2019-04-22 DIAGNOSIS — Z79899 Other long term (current) drug therapy: Secondary | ICD-10-CM | POA: Diagnosis not present

## 2019-04-22 DIAGNOSIS — T451X5A Adverse effect of antineoplastic and immunosuppressive drugs, initial encounter: Secondary | ICD-10-CM | POA: Diagnosis not present

## 2019-04-22 DIAGNOSIS — N189 Chronic kidney disease, unspecified: Secondary | ICD-10-CM | POA: Diagnosis not present

## 2019-04-22 DIAGNOSIS — G4733 Obstructive sleep apnea (adult) (pediatric): Secondary | ICD-10-CM | POA: Diagnosis not present

## 2019-04-22 DIAGNOSIS — Z5111 Encounter for antineoplastic chemotherapy: Secondary | ICD-10-CM | POA: Diagnosis not present

## 2019-04-22 DIAGNOSIS — R112 Nausea with vomiting, unspecified: Secondary | ICD-10-CM | POA: Diagnosis not present

## 2019-04-22 DIAGNOSIS — Z452 Encounter for adjustment and management of vascular access device: Secondary | ICD-10-CM | POA: Diagnosis not present

## 2019-04-22 DIAGNOSIS — C9000 Multiple myeloma not having achieved remission: Secondary | ICD-10-CM | POA: Diagnosis not present

## 2019-04-23 DIAGNOSIS — Z713 Dietary counseling and surveillance: Secondary | ICD-10-CM | POA: Diagnosis not present

## 2019-04-23 DIAGNOSIS — K219 Gastro-esophageal reflux disease without esophagitis: Secondary | ICD-10-CM | POA: Diagnosis not present

## 2019-04-23 DIAGNOSIS — Z9481 Bone marrow transplant status: Secondary | ICD-10-CM | POA: Insufficient documentation

## 2019-04-23 DIAGNOSIS — Z95828 Presence of other vascular implants and grafts: Secondary | ICD-10-CM | POA: Diagnosis not present

## 2019-04-23 DIAGNOSIS — R112 Nausea with vomiting, unspecified: Secondary | ICD-10-CM | POA: Diagnosis not present

## 2019-04-23 DIAGNOSIS — T451X5A Adverse effect of antineoplastic and immunosuppressive drugs, initial encounter: Secondary | ICD-10-CM | POA: Diagnosis not present

## 2019-04-23 DIAGNOSIS — Z9484 Stem cells transplant status: Secondary | ICD-10-CM | POA: Diagnosis not present

## 2019-04-23 DIAGNOSIS — I129 Hypertensive chronic kidney disease with stage 1 through stage 4 chronic kidney disease, or unspecified chronic kidney disease: Secondary | ICD-10-CM | POA: Diagnosis not present

## 2019-04-23 DIAGNOSIS — G4733 Obstructive sleep apnea (adult) (pediatric): Secondary | ICD-10-CM | POA: Diagnosis not present

## 2019-04-23 DIAGNOSIS — C9000 Multiple myeloma not having achieved remission: Secondary | ICD-10-CM | POA: Diagnosis not present

## 2019-04-23 DIAGNOSIS — N189 Chronic kidney disease, unspecified: Secondary | ICD-10-CM | POA: Diagnosis not present

## 2019-05-02 DIAGNOSIS — D61810 Antineoplastic chemotherapy induced pancytopenia: Secondary | ICD-10-CM | POA: Diagnosis not present

## 2019-05-02 DIAGNOSIS — I129 Hypertensive chronic kidney disease with stage 1 through stage 4 chronic kidney disease, or unspecified chronic kidney disease: Secondary | ICD-10-CM | POA: Diagnosis not present

## 2019-05-02 DIAGNOSIS — N179 Acute kidney failure, unspecified: Secondary | ICD-10-CM | POA: Diagnosis not present

## 2019-05-02 DIAGNOSIS — E876 Hypokalemia: Secondary | ICD-10-CM | POA: Diagnosis not present

## 2019-05-02 DIAGNOSIS — D709 Neutropenia, unspecified: Secondary | ICD-10-CM | POA: Diagnosis not present

## 2019-05-02 DIAGNOSIS — B372 Candidiasis of skin and nail: Secondary | ICD-10-CM | POA: Diagnosis not present

## 2019-05-02 DIAGNOSIS — C9000 Multiple myeloma not having achieved remission: Secondary | ICD-10-CM | POA: Diagnosis not present

## 2019-05-02 DIAGNOSIS — R197 Diarrhea, unspecified: Secondary | ICD-10-CM | POA: Diagnosis not present

## 2019-05-02 DIAGNOSIS — R918 Other nonspecific abnormal finding of lung field: Secondary | ICD-10-CM | POA: Diagnosis not present

## 2019-05-02 DIAGNOSIS — Z9481 Bone marrow transplant status: Secondary | ICD-10-CM | POA: Diagnosis not present

## 2019-05-02 DIAGNOSIS — N189 Chronic kidney disease, unspecified: Secondary | ICD-10-CM | POA: Diagnosis not present

## 2019-05-02 DIAGNOSIS — K219 Gastro-esophageal reflux disease without esophagitis: Secondary | ICD-10-CM | POA: Diagnosis not present

## 2019-05-02 DIAGNOSIS — G4733 Obstructive sleep apnea (adult) (pediatric): Secondary | ICD-10-CM | POA: Diagnosis not present

## 2019-05-02 DIAGNOSIS — Z9484 Stem cells transplant status: Secondary | ICD-10-CM | POA: Diagnosis not present

## 2019-05-02 DIAGNOSIS — R5081 Fever presenting with conditions classified elsewhere: Secondary | ICD-10-CM | POA: Diagnosis not present

## 2019-05-02 DIAGNOSIS — R609 Edema, unspecified: Secondary | ICD-10-CM | POA: Diagnosis not present

## 2019-05-02 DIAGNOSIS — T451X5A Adverse effect of antineoplastic and immunosuppressive drugs, initial encounter: Secondary | ICD-10-CM | POA: Diagnosis not present

## 2019-05-02 DIAGNOSIS — M25512 Pain in left shoulder: Secondary | ICD-10-CM | POA: Diagnosis not present

## 2019-05-03 DIAGNOSIS — C9000 Multiple myeloma not having achieved remission: Secondary | ICD-10-CM | POA: Diagnosis not present

## 2019-05-03 DIAGNOSIS — Z9481 Bone marrow transplant status: Secondary | ICD-10-CM | POA: Diagnosis not present

## 2019-05-03 MED ORDER — Medication
2.00 | Status: DC
Start: 2019-05-03 — End: 2019-05-03

## 2019-05-03 MED ORDER — Medication
1000.00 | Status: DC
Start: 2019-05-03 — End: 2019-05-03

## 2019-05-03 MED ORDER — Medication
Status: DC
Start: ? — End: 2019-05-03

## 2019-05-03 MED ORDER — RA VITAMIN C 500 MG PO TABS
480.00 | ORAL_TABLET | ORAL | Status: DC
Start: 2019-05-03 — End: 2019-05-03

## 2019-05-03 MED ORDER — LIDOCAINE 5 % EX PTCH
1.00 | MEDICATED_PATCH | CUTANEOUS | Status: DC
Start: 2019-05-03 — End: 2019-05-03

## 2019-05-03 MED ORDER — AMLODIPINE BESYLATE 10 MG PO TABS
10.00 | ORAL_TABLET | ORAL | Status: DC
Start: 2019-05-03 — End: 2019-05-03

## 2019-05-03 MED ORDER — ONDANSETRON HCL 8 MG PO TABS
8.00 | ORAL_TABLET | ORAL | Status: DC
Start: ? — End: 2019-05-03

## 2019-05-03 MED ORDER — Medication
10.00 | Status: DC
Start: 2019-05-03 — End: 2019-05-03

## 2019-05-03 MED ORDER — EXCEDRIN QUICKTABS 500-65 MG PO TBDP
10.00 | ORAL_TABLET | ORAL | Status: DC
Start: ? — End: 2019-05-03

## 2019-05-03 MED ORDER — ZONAS POROUS TAPE
1.00 | MEDICATED_TAPE | Status: DC
Start: 2019-05-03 — End: 2019-05-03

## 2019-05-03 MED ORDER — FLUCONAZOLE 200 MG PO TABS
400.00 | ORAL_TABLET | ORAL | Status: DC
Start: 2019-05-03 — End: 2019-05-03

## 2019-05-03 MED ORDER — LORAZEPAM 1 MG PO TABS
1.00 | ORAL_TABLET | ORAL | Status: DC
Start: ? — End: 2019-05-03

## 2019-05-03 MED ORDER — GENERIC EXTERNAL MEDICATION
Status: DC
Start: ? — End: 2019-05-03

## 2019-05-03 MED ORDER — ACYCLOVIR 200 MG PO CAPS
400.00 | ORAL_CAPSULE | ORAL | Status: DC
Start: 2019-05-03 — End: 2019-05-03

## 2019-05-03 MED ORDER — METOPROLOL SUCCINATE ER 50 MG PO TB24
150.00 | ORAL_TABLET | ORAL | Status: DC
Start: 2019-05-03 — End: 2019-05-03

## 2019-05-03 MED ORDER — VANCOMYCIN HCL IN NACL 1-0.9 GM/250ML-% IV SOLN
1.00 | INTRAVENOUS | Status: DC
Start: 2019-05-03 — End: 2019-05-03

## 2019-05-04 DIAGNOSIS — Z9481 Bone marrow transplant status: Secondary | ICD-10-CM | POA: Diagnosis not present

## 2019-05-04 DIAGNOSIS — C9000 Multiple myeloma not having achieved remission: Secondary | ICD-10-CM | POA: Diagnosis not present

## 2019-05-06 MED ORDER — BARO-CAT PO
ORAL | Status: DC
Start: ? — End: 2019-05-06

## 2019-05-06 MED ORDER — GENERIC EXTERNAL MEDICATION
1000.00 | Status: DC
Start: 2019-05-07 — End: 2019-05-06

## 2019-05-06 MED ORDER — LORAZEPAM 1 MG PO TABS
1.00 | ORAL_TABLET | ORAL | Status: DC
Start: ? — End: 2019-05-06

## 2019-05-06 MED ORDER — TBO-FILGRASTIM 480 MCG/0.8ML ~~LOC~~ SOSY
5.00 | PREFILLED_SYRINGE | SUBCUTANEOUS | Status: DC
Start: 2019-05-07 — End: 2019-05-06

## 2019-05-06 MED ORDER — METHYLPREDNISOLONE SODIUM SUCC 125 MG IJ SOLR
25.00 | INTRAMUSCULAR | Status: DC
Start: 2019-05-06 — End: 2019-05-06

## 2019-05-06 MED ORDER — PRO HERBS ENERGY PO TABS
20.00 | ORAL_TABLET | ORAL | Status: DC
Start: 2019-05-07 — End: 2019-05-06

## 2019-05-06 MED ORDER — ACYCLOVIR 200 MG PO CAPS
400.00 | ORAL_CAPSULE | ORAL | Status: DC
Start: 2019-05-06 — End: 2019-05-06

## 2019-05-06 MED ORDER — PROCHLORPERAZINE MALEATE 10 MG PO TABS
10.00 | ORAL_TABLET | ORAL | Status: DC
Start: ? — End: 2019-05-06

## 2019-05-06 MED ORDER — PEDIASURE 1.0 CAL/FIBER PO LIQD
2.00 | ORAL | Status: DC
Start: ? — End: 2019-05-06

## 2019-05-06 MED ORDER — ZONAS POROUS TAPE
MEDICATED_TAPE | Status: DC
Start: 2019-05-06 — End: 2019-05-06

## 2019-05-06 MED ORDER — Medication
5.00 | Status: DC
Start: ? — End: 2019-05-06

## 2019-05-06 MED ORDER — METOPROLOL SUCCINATE ER 50 MG PO TB24
150.00 | ORAL_TABLET | ORAL | Status: DC
Start: 2019-05-07 — End: 2019-05-06

## 2019-05-06 MED ORDER — Medication
8.00 | Status: DC
Start: ? — End: 2019-05-06

## 2019-05-06 MED ORDER — AMLODIPINE BESYLATE 10 MG PO TABS
10.00 | ORAL_TABLET | ORAL | Status: DC
Start: 2019-05-07 — End: 2019-05-06

## 2019-05-08 DIAGNOSIS — Z9481 Bone marrow transplant status: Secondary | ICD-10-CM | POA: Diagnosis not present

## 2019-05-08 DIAGNOSIS — C9000 Multiple myeloma not having achieved remission: Secondary | ICD-10-CM | POA: Diagnosis not present

## 2019-05-10 DIAGNOSIS — Z9481 Bone marrow transplant status: Secondary | ICD-10-CM | POA: Diagnosis not present

## 2019-05-10 DIAGNOSIS — Z452 Encounter for adjustment and management of vascular access device: Secondary | ICD-10-CM | POA: Diagnosis not present

## 2019-05-10 DIAGNOSIS — C9000 Multiple myeloma not having achieved remission: Secondary | ICD-10-CM | POA: Diagnosis not present

## 2019-05-16 DIAGNOSIS — C9000 Multiple myeloma not having achieved remission: Secondary | ICD-10-CM | POA: Diagnosis not present

## 2019-05-18 ENCOUNTER — Inpatient Hospital Stay: Payer: BC Managed Care – PPO | Attending: Oncology | Admitting: Oncology

## 2019-05-18 ENCOUNTER — Other Ambulatory Visit: Payer: Self-pay

## 2019-05-18 ENCOUNTER — Inpatient Hospital Stay: Payer: BC Managed Care – PPO

## 2019-05-18 ENCOUNTER — Telehealth: Payer: Self-pay | Admitting: *Deleted

## 2019-05-18 VITALS — BP 135/91 | HR 87 | Temp 98.3°F | Resp 18 | Wt 199.6 lb

## 2019-05-18 DIAGNOSIS — E785 Hyperlipidemia, unspecified: Secondary | ICD-10-CM | POA: Insufficient documentation

## 2019-05-18 DIAGNOSIS — D696 Thrombocytopenia, unspecified: Secondary | ICD-10-CM | POA: Insufficient documentation

## 2019-05-18 DIAGNOSIS — C9000 Multiple myeloma not having achieved remission: Secondary | ICD-10-CM | POA: Diagnosis not present

## 2019-05-18 DIAGNOSIS — R1030 Lower abdominal pain, unspecified: Secondary | ICD-10-CM | POA: Diagnosis not present

## 2019-05-18 DIAGNOSIS — D649 Anemia, unspecified: Secondary | ICD-10-CM

## 2019-05-18 DIAGNOSIS — R600 Localized edema: Secondary | ICD-10-CM | POA: Diagnosis not present

## 2019-05-18 DIAGNOSIS — R5383 Other fatigue: Secondary | ICD-10-CM | POA: Diagnosis not present

## 2019-05-18 DIAGNOSIS — I129 Hypertensive chronic kidney disease with stage 1 through stage 4 chronic kidney disease, or unspecified chronic kidney disease: Secondary | ICD-10-CM | POA: Insufficient documentation

## 2019-05-18 DIAGNOSIS — Z9484 Stem cells transplant status: Secondary | ICD-10-CM | POA: Insufficient documentation

## 2019-05-18 DIAGNOSIS — N183 Chronic kidney disease, stage 3 unspecified: Secondary | ICD-10-CM

## 2019-05-18 DIAGNOSIS — Z79899 Other long term (current) drug therapy: Secondary | ICD-10-CM | POA: Diagnosis not present

## 2019-05-18 LAB — CBC WITH DIFFERENTIAL/PLATELET
Abs Immature Granulocytes: 0.03 10*3/uL (ref 0.00–0.07)
Basophils Absolute: 0 10*3/uL (ref 0.0–0.1)
Basophils Relative: 0 %
Eosinophils Absolute: 0 10*3/uL (ref 0.0–0.5)
Eosinophils Relative: 0 %
HCT: 27.2 % — ABNORMAL LOW (ref 39.0–52.0)
Hemoglobin: 9.2 g/dL — ABNORMAL LOW (ref 13.0–17.0)
Immature Granulocytes: 1 %
Lymphocytes Relative: 21 %
Lymphs Abs: 1.1 10*3/uL (ref 0.7–4.0)
MCH: 31.6 pg (ref 26.0–34.0)
MCHC: 33.8 g/dL (ref 30.0–36.0)
MCV: 93.5 fL (ref 80.0–100.0)
Monocytes Absolute: 1 10*3/uL (ref 0.1–1.0)
Monocytes Relative: 19 %
Neutro Abs: 3 10*3/uL (ref 1.7–7.7)
Neutrophils Relative %: 59 %
Platelets: 139 10*3/uL — ABNORMAL LOW (ref 150–400)
RBC: 2.91 MIL/uL — ABNORMAL LOW (ref 4.22–5.81)
RDW: 15.6 % — ABNORMAL HIGH (ref 11.5–15.5)
WBC: 5.2 10*3/uL (ref 4.0–10.5)
nRBC: 0 % (ref 0.0–0.2)

## 2019-05-18 LAB — COMPREHENSIVE METABOLIC PANEL
ALT: 26 U/L (ref 0–44)
AST: 22 U/L (ref 15–41)
Albumin: 4.1 g/dL (ref 3.5–5.0)
Alkaline Phosphatase: 76 U/L (ref 38–126)
Anion gap: 8 (ref 5–15)
BUN: 15 mg/dL (ref 8–23)
CO2: 25 mmol/L (ref 22–32)
Calcium: 8.9 mg/dL (ref 8.9–10.3)
Chloride: 107 mmol/L (ref 98–111)
Creatinine, Ser: 1.78 mg/dL — ABNORMAL HIGH (ref 0.61–1.24)
GFR calc Af Amer: 46 mL/min — ABNORMAL LOW (ref >60)
GFR calc non Af Amer: 40 mL/min — ABNORMAL LOW (ref >60)
Glucose, Bld: 120 mg/dL — ABNORMAL HIGH (ref 70–99)
Potassium: 4.2 mmol/L (ref 3.5–5.1)
Sodium: 140 mmol/L (ref 135–145)
Total Bilirubin: 0.5 mg/dL (ref 0.3–1.2)
Total Protein: 6.6 g/dL (ref 6.5–8.1)

## 2019-05-18 NOTE — Progress Notes (Signed)
Patient here for follow up after stem cell transplant.

## 2019-05-18 NOTE — Telephone Encounter (Signed)
Patient called [facility] as requested and they told him that they sent information for Dr Tasia Catchings via [facility] and that Dr Tasia Catchings should be able to see it as they sent t on 7/27 and then again today

## 2019-05-18 NOTE — Progress Notes (Signed)
Hematology/Oncology  Follow up note [facility] Telephone:([phone] [phone]) [phone]   Patient Care Team: Tonia Ghent, MD as PCP - General (Family Medicine)  REFERRING PROVIDER: Napoleon Form REASON FOR VISIT Follow up for multiple myeloma.  Groin pain.  HISTORY OF PRESENTING ILLNESS:  Luis Burke is a  63 y.o.  male with PMH listed below who was referred to me for evaluation of anemia and thrombocytopenia Associated signs and symptoms: Patient reports feeling fatigue.  Denies SOB with exertion.  Denies weight loss, easy bruising, hematochezia, hemoptysis, hematuria. Context: History of GI bleeding: Denies               History of Chronic kidney disease yes               History of autoimmune disease denies denies               History of hemolytic anemia.                Last colonoscopy: 07/20/2018 upper and lower endoscopy showed esophagitis, normal examined duodenum, 2 polyps removed in the ascending colon, resected and retrieved.  Otherwise normal. He drinks alcohol, usually a few beers during the weekends.  Denies any smoking history.   Intended weight loss 3 pounds for the past 3 months.  # 07/27/2018 multiple myeloma panel showed M protein of 0.1, IgG 662, IgA 49, IgM 7. Patient was called back to further labs done. 08/10/2018, free light chain ratio showed extremely high level of kappa free light chain 10,183, with a kappa lambda light chain ratio of 1414.31 LDH 164 Beta-2 microglobulin 5 Patient was called and discuss about results.  He was recommended to undergo bone marrow biopsy and PET scan. 08/19/2018 bone marrow biopsy showed hypercellular marrow 80%, involved by plasma cell neoplasm up to 95%.  Consistent with plasma cell myeloma.  FISH and cytogenetics are pending.  09/10/2018 skeletal survey showed questionable small lucent lesions within the midshaft of the humerus bilaterally.  Otherwise no suspicious focal lytic lesion or acute bone  abnormality. 08/25/2018 PET scan showed no definite hypermetabolic bone disease but CT findings are highly suspicious for numerous small myelomatous lesions involving the spine, sternum and scattered ribs.   INTERVAL HISTORY Luis Burke is a 63 y.o. 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 He received preparative regimen with melphalan 200 mg/m on 04/22/2019 followed by autologous stem cell infusion on 04/23/2019. Currently he is transfusion dependent.  Transfusion criteria with as needed hemoglobin less than 7.5 for platelet less than 10,000.  Transplant course was complicated with febrile neutropenia grade 3, treated with vancomycin and cephapirin until engraftment on 05/06/2019. Patient also had engraftment syndrome and had to be started on Solu-Medrol 25 mg twice daily on 05/05/2019.  Transition to Medrol Dosepak on 05/07/2019 to complete steroid taper as outpatient.  Patient is currently on acyclovir for 1 year after transplant, dose was reduced on 722 11/20/2018 due to renal function. Patient had a baseline CKD with creatinine running between 1.4-1.6.  He had acute on chronic kidney injury and creatinine went up to 2.2 on 722.  Fluid hydration was given and creatinine decreased to baseline 1.5-1.7.  His Hickman catheter was removed.  Candida Cruris treated with topical clotrimazole 1% 3 times daily and to resolution..  Today patient reports feeling tired and fatigued.  Otherwise no new complaints.  Denies any  fever, chills, shortness of breath or abdominal pain.  Review of Systems  Constitutional: Negative for appetite change, chills, fatigue, fever and unexpected weight change.  HENT:   Negative for hearing loss and voice change.   Eyes: Negative for eye problems and icterus.  Respiratory: Negative for chest tightness, cough and shortness of breath.   Cardiovascular: Negative for chest pain  and leg swelling.  Gastrointestinal: Negative for abdominal distention and abdominal pain.  Endocrine: Negative for hot flashes.  Genitourinary: Negative for difficulty urinating, dysuria and frequency.   Musculoskeletal: Negative for arthralgias.  Skin: Negative for itching and rash.  Neurological: Negative for light-headedness and numbness.  Hematological: Negative for adenopathy. Does not bruise/bleed easily.  Psychiatric/Behavioral: Negative for confusion.    MEDICAL HISTORY:  Past Medical History:  Diagnosis Date   AKI (acute kidney injury) ([place]) 01/06/2019   Anemia    Fatty liver    on u/s 07/2018   Hyperlipidemia 03/2004   Hypertension 1994   Snores     SURGICAL HISTORY: Past Surgical History:  Procedure Laterality Date   CATARACT EXTRACTION W/PHACO Right 03/04/2018   Procedure: CATARACT EXTRACTION PHACO AND INTRAOCULAR LENS PLACEMENT ([place])  RIGHT TORIC;  Surgeon: Leandrew Koyanagi, MD;  Location: [facility];  Service: Ophthalmology;  Laterality: Right;  [place] no Toric Lens 1:45 5.3   COLONOSCOPY WITH PROPOFOL N/A 07/20/2018   Procedure: COLONOSCOPY WITH PROPOFOL;  Surgeon: Jonathon Bellows, MD;  Location: [facility] ENDOSCOPY;  Service: Gastroenterology;  Laterality: N/A;   ESOPHAGOGASTRODUODENOSCOPY (EGD) WITH PROPOFOL N/A 07/20/2018   Procedure: ESOPHAGOGASTRODUODENOSCOPY (EGD) WITH PROPOFOL;  Surgeon: Jonathon Bellows, MD;  Location: [facility] ENDOSCOPY;  Service: Gastroenterology;  Laterality: N/A;   EYE SURGERY Right    HERNIA REPAIR     L Lap herniorraphy  04/2000   Left wrist ganglionectomy      SOCIAL HISTORY: Social History   Socioeconomic History   Marital status: Single    Spouse name: Not on file   Number of children: 1   Years of education: Not on file   Highest education level: Not on file  Occupational History   Occupation: [employer]  Scientist, product/process development strain: Not on file   Food insecurity    Worry: Not on file     Inability: Not on file   Transportation needs    Medical: Not on file    Non-medical: Not on file  Tobacco Use   Smoking status: Never Smoker   Smokeless tobacco: Never Used   Tobacco comment: Occassionally  Substance and Sexual Activity   Alcohol use: Not Currently    Alcohol/week: 3.0 standard drinks    Types: 3 Cans of beer per week   Drug use: No   Sexual activity: Not on file  Lifestyle   Physical activity    Days per week: Not on file    Minutes per session: Not on file   Stress: Not on file  Relationships   Social connections    Talks on phone: Not on file    Gets together: Not on file    Attends religious service: Not on file    Active member of club or organization: Not on file    Attends meetings of clubs or organizations: Not on file    Relationship status: Not on file   Intimate partner violence    Fear of current or ex partner: Not on file    Emotionally abused: Not on file    Physically abused: Not on  file    Forced sexual activity: Not on file  Other Topics Concern   Not on file  Social History Narrative   Divorced 12-18-07, dating as of 2017/12/17   His daughter died 4 days after giving birth to the patient's granddaughter   Has joint custody of his dead daughter's child   Works at [employer] is [employer]    FAMILY HISTORY: Family History  Problem Relation Age of Onset   Hypertension Mother    Diabetes Mother    Heart disease Mother        CAD   Dementia Mother    Hypertension Father    Heart disease Father        MI 02/03   Colon cancer Father    Hypertension Sister    Hypertension Sister    Hypertension Sister    Prostate cancer Neg Hx     ALLERGIES:  is allergic to bactrim [sulfamethoxazole-trimethoprim] and tadalafil.  MEDICATIONS:  Current Outpatient Medications  Medication Sig Dispense Refill   acetaminophen (TYLENOL) 325 MG tablet Take 650 mg by mouth every 6 (six) hours as needed.     acyclovir (ZOVIRAX) 400  MG tablet Take 1 tablet (400 mg total) by mouth 2 (two) times daily. 60 tablet 3   amLODipine (NORVASC) 5 MG tablet Take 2 tablets (10 mg total) by mouth daily.     cephALEXin (KEFLEX) 500 MG capsule Take by mouth.     [facility] CLOTRIMAZOLE 1 % cream APPLY TO AFFECTED AREA 3 TIMES A DAY FOR 10 DAYS     [facility] VITAMIN B12 1000 MCG tablet TAKE 1 TABLET BY MOUTH EVERY DAY 90 tablet 1   cyclobenzaprine (FLEXERIL) 5 MG tablet Take 1 tablet (5 mg total) by mouth 3 (three) times daily as needed for muscle spasms. 30 tablet 0   famotidine (PEPCID) 20 MG tablet Take 20 mg by mouth 2 (two) times daily.     LORazepam (ATIVAN) 1 MG tablet TAKE 1 TABLET (1 MG TOTAL) BY MOUTH EVERY 6 (SIX) HOURS AS NEEDED FOR ANXIETY (FOR NAUSEA)     metoprolol succinate (TOPROL-XL) 50 MG 24 hr tablet Take 3 tablets (150 mg total) by mouth daily.     ondansetron (ZOFRAN) 8 MG tablet Take 8 mg by mouth every 8 (eight) hours as needed. for nausea     oxyCODONE (OXY IR/ROXICODONE) 5 MG immediate release tablet TAKE 1 TABLET (5 MG TOTAL) BY MOUTH EVERY 4 (FOUR) HOURS AS NEEDED FOR PAIN FOR UP TO 5 DAYS     prochlorperazine (COMPAZINE) 10 MG tablet TAKE 1 TABLET (10 MG TOTAL) BY MOUTH EVERY 6 (SIX) HOURS AS NEEDED FOR NAUSEA     co-enzyme Q-10 50 MG capsule Take 50 mg by mouth daily.     dexamethasone (DECADRON) 4 MG tablet Take 10 tablets (40 mg total) by mouth once a week. (Patient not taking: Reported on 05/18/2019) 30 tablet 0   lenalidomide (REVLIMID) 25 MG capsule TAKE 1 CAPSULE BY MOUTH EVERY DAY FOR 14 DAYS THEN HOLD FOR 7 DAYS, REPEAT EVERY 21 DAYS. (Patient not taking: Reported on 05/18/2019) 14 capsule 0   No current facility-administered medications for this visit.      PHYSICAL EXAMINATION: ECOG PERFORMANCE STATUS: 1 - Symptomatic but completely ambulatory Vitals:   05/18/19 1001  BP: (!) 135/91  Pulse: 87  Resp: 18  Temp: 98.3 F (36.8 C)   Filed Weights   05/18/19 1001  Weight: 199 lb 9.6 oz (90.5 kg)  Physical Exam Constitutional:      General: He is not in acute distress. HENT:     Head: Normocephalic and atraumatic.  Eyes:     General: No scleral icterus.    Pupils: Pupils are equal, round, and reactive to light.  Neck:     Musculoskeletal: Normal range of motion and neck supple.  Cardiovascular:     Rate and Rhythm: Normal rate and regular rhythm.     Heart sounds: Normal heart sounds.  Pulmonary:     Effort: Pulmonary effort is normal. No respiratory distress.     Breath sounds: No wheezing.  Abdominal:     General: Bowel sounds are normal. There is no distension.     Palpations: Abdomen is soft. There is no mass.     Tenderness: There is no abdominal tenderness.  Musculoskeletal: Normal range of motion.        General: No deformity.     Comments: Trace edema at ankle level bilaterally.  Skin:    General: Skin is warm and dry.     Findings: No erythema or rash.  Neurological:     Mental Status: He is alert and oriented to person, place, and time.     Cranial Nerves: No cranial nerve deficit.     Coordination: Coordination normal.  Psychiatric:        Behavior: Behavior normal.        Thought Content: Thought content normal.      LABORATORY DATA:  I have reviewed the data as listed Lab Results  Component Value Date   WBC 5.2 05/18/2019   HGB 9.2 (L) 05/18/2019   HCT 27.2 (L) 05/18/2019   MCV 93.5 05/18/2019   PLT 139 (L) 05/18/2019   Recent Labs    03/03/19 1228 03/10/19 1306 05/18/19 1105  NA 135 138 140  K 4.1 4.0 4.2  CL 98 107 107  CO2 _0 GLUCOSE 172* 119* 120*  BUN 22 26* 15  CREATININE 1.62* 1.30* 1.78*  CALCIUM 9.0 9.2 8.9  GFRNONAA 45* 58* 40*  GFRAA 52* >60 46*  PROT 7.1 7.1 6.6  ALBUMIN 4.2 4.3 4.1  AST _1 ALT 31 37 26  ALKPHOS 55 64 76  BILITOT 1.0 0.7 0.5   Iron/TIBC/Ferritin/ %Sat    Component Value Date/Time   IRON 74 07/01/2018 0944   TIBC 250 07/01/2018 0944   FERRITIN 357 07/01/2018 0944   IRONPCTSAT  30 07/01/2018 0944    Lab Results  Component Value Date   TOTALPROTELP 6.6 03/10/2019   ALBUMINELP 3.7 03/10/2019   A1GS 0.3 03/10/2019   A2GS 0.8 03/10/2019   BETS 1.3 03/10/2019   GAMS 0.6 03/10/2019   MSPIKE Not Observed 03/10/2019   SPEI Comment 03/10/2019   Lab Results  Component Value Date   KPAFRELGTCHN 64.1 (H) 03/10/2019   LAMBDASER 14.2 03/10/2019   KAPLAMBRATIO 4.51 (H) 03/10/2019    RADIOGRAPHIC STUDIES: I have personally reviewed the radiological images as listed and agreed with the findings in the report. 10/23/2018 FINDINGS: The heart size and mediastinal contours are within normal limits. Both lungs are clear. The visualized skeletal structures are unremarkable. IMPRESSION: No active cardiopulmonary disease.   ASSESSMENT & PLAN:  1. Multiple myeloma, remission status unspecified ([place])   2. 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 Anemia, unspecified type   4. Thrombocytopenia ([place])   5. CKD (chronic kidney disease) stage 3, GFR 30-59 ml/min ([facility])    #Light chain multiple myeloma  beta 2 microglobulin 5 and normal albumin.  Stage II. cytogenetics showed normal male chromosome, MDS FISH panel showed trisomy 32- Standard Risk. S/p RVD x 9 Autologous bone marrow stem cell transplant at [facility] on 04/23/2019. Today's day +26 Patient is transfusion dependent.  Check CBC and CMP today. Defer post transplant evaluation -Day 100 Bone marrow/ immunization to [facility] Dr.Choi,   # Anemia and thrombocytopenia, stable.  #CKD, recent episode of acute on chronic CKD. Labs reviewed.  Creatinine 1.7, close to his baseline.  Continue monitor.  #Prophylactic antibiotics patient is on acyclovir for 1 year post transplant. #Patient has antiemetics available at home for nausea or vomiting. #GI prophylaxis with Pepcid 20 mg daily.  RTC:  To be determined    Earlie Server, MD, PhD Hematology Oncology [place] at [facility] 05/18/2019

## 2019-05-19 ENCOUNTER — Other Ambulatory Visit: Payer: Self-pay | Admitting: Family Medicine

## 2019-05-24 ENCOUNTER — Encounter: Payer: Self-pay | Admitting: Oncology

## 2019-05-27 ENCOUNTER — Other Ambulatory Visit: Payer: Self-pay | Admitting: Oncology

## 2019-05-27 DIAGNOSIS — C9000 Multiple myeloma not having achieved remission: Secondary | ICD-10-CM

## 2019-06-02 DIAGNOSIS — C9000 Multiple myeloma not having achieved remission: Secondary | ICD-10-CM | POA: Diagnosis not present

## 2019-06-04 ENCOUNTER — Telehealth: Payer: Self-pay

## 2019-06-04 MED ORDER — METOPROLOL SUCCINATE ER 50 MG PO TB24: 150.0000 mg | ORAL_TABLET | Freq: Every day | ORAL | Status: DC

## 2019-06-04 NOTE — Telephone Encounter (Signed)
Patient is calling in regards to his b/p readings, he has been getting readings of 160-170/105-99 for the past 2 weeks. His oncologist told patient to get in touch with [place] about his b/p. Patient states he is been treated for multiple myeloma. He is getting ready to be set up for his monthly regimen in regards to the cancer but doctor was concerned with his b/p readings and wanted that addressed first. Patient states he is not feeling bad besides the normal feelings after chemo treatments. Patient is not having a h/a, no chest pain or tightness, no dizziness. Patient is taking Metoprolol 3 tablets daily and Amlodipine 2 tablets daily as directed in the chart. Does patient need virtual visit or other recommendations?

## 2019-06-04 NOTE — Telephone Encounter (Signed)
If pulse is typically below 60 already, then let me know.  If his pulse is usually >60, then I would inc metoprolol to 4 tabs a day.  Update me about his BP after the change.  If his pulse is <60 after the inc in metoprolol, then let me know.  Thanks.

## 2019-06-04 NOTE — Telephone Encounter (Signed)
Patient advised.

## 2019-06-07 MED ORDER — CLONIDINE HCL 0.1 MG PO TABS: 0.1000 mg | ORAL_TABLET | Freq: Two times a day (BID) | ORAL | 3 refills | Status: DC

## 2019-06-07 NOTE — Telephone Encounter (Signed)
Add on clonidine 0.1mg  BID.  Advise him not to miss doses as he can get rebound HTN on this med.   Continue other meds for now.  Update me about his BP and pulse in a few days, sooner if needed.   If he has sig BP effect with this med, then he may get a little lightheaded and may need to decrease metoprolol back to 2 pills a day.   Thanks.

## 2019-06-07 NOTE — Addendum Note (Signed)
Addended by: Tonia Ghent on: 06/07/2019 02:03 PM   Modules accepted: Orders

## 2019-06-07 NOTE — Telephone Encounter (Signed)
Patient advised.

## 2019-06-07 NOTE — Telephone Encounter (Signed)
Pt left v/m that last week metoprolol was increased. BP still high this AM BP 174/107. I spoke with pt; pt presently taking metoprolol 50 mg two tabs bid and amlodipine 5 mg taking one tab bid. Today pt took metoprolol 50 mg taking 4 tabs this morning. Pt took Amlodipine 5 mg taking 2 tabs this morning. Earlier BP 174/107 after taking meds this morning. Pt rechecked BP now  BP is 173/101 P 88. No CP,SOB,dizziness. Pt has had H/A on and off since 06/03/19. Pt concerned that BP staying so high. Tylenol 500 mg helps h/a; last H/a this morning.  Pt has chills on and off due to med pt is taking. Pt has appt with [facility] next wk. Pt has neuropathy in feet. The chemo treatment pt took has changed pts smelling ability; pt can smell things. No travel and no known exposure to covid. Pt said has been 40 days since bone marrow transplant. Pt has no covid symptoms other than listed above, no travel and no known exposure to + covid. Pt request cb after Dr Damita Dunnings reviews. ED precautions given and pt voiced understanding. Pt also noted that a friend of his had CA and just died last night with a stroke. Pt request cb.

## 2019-06-14 ENCOUNTER — Other Ambulatory Visit: Payer: Self-pay

## 2019-06-14 DIAGNOSIS — C9000 Multiple myeloma not having achieved remission: Secondary | ICD-10-CM

## 2019-06-15 ENCOUNTER — Inpatient Hospital Stay: Payer: BC Managed Care – PPO | Attending: Oncology

## 2019-06-15 ENCOUNTER — Other Ambulatory Visit: Payer: Self-pay

## 2019-06-15 ENCOUNTER — Inpatient Hospital Stay (HOSPITAL_BASED_OUTPATIENT_CLINIC_OR_DEPARTMENT_OTHER): Payer: BC Managed Care – PPO | Admitting: Oncology

## 2019-06-15 ENCOUNTER — Ambulatory Visit
Admission: RE | Admit: 2019-06-15 | Discharge: 2019-06-15 | Disposition: A | Discharge status: Home / Self Care | Payer: BC Managed Care – PPO | Source: Ambulatory Visit | Attending: Oncology | Admitting: Oncology

## 2019-06-15 VITALS — BP 155/97 | HR 80 | Temp 97.7°F | Wt 201.0 lb

## 2019-06-15 DIAGNOSIS — R5383 Other fatigue: Secondary | ICD-10-CM | POA: Insufficient documentation

## 2019-06-15 DIAGNOSIS — R600 Localized edema: Secondary | ICD-10-CM | POA: Insufficient documentation

## 2019-06-15 DIAGNOSIS — C9000 Multiple myeloma not having achieved remission: Secondary | ICD-10-CM | POA: Insufficient documentation

## 2019-06-15 DIAGNOSIS — N183 Chronic kidney disease, stage 3 unspecified: Secondary | ICD-10-CM

## 2019-06-15 DIAGNOSIS — D649 Anemia, unspecified: Secondary | ICD-10-CM | POA: Diagnosis not present

## 2019-06-15 DIAGNOSIS — D696 Thrombocytopenia, unspecified: Secondary | ICD-10-CM | POA: Diagnosis not present

## 2019-06-15 DIAGNOSIS — R1030 Lower abdominal pain, unspecified: Secondary | ICD-10-CM | POA: Insufficient documentation

## 2019-06-15 DIAGNOSIS — E785 Hyperlipidemia, unspecified: Secondary | ICD-10-CM | POA: Diagnosis not present

## 2019-06-15 DIAGNOSIS — Z79899 Other long term (current) drug therapy: Secondary | ICD-10-CM | POA: Diagnosis not present

## 2019-06-15 DIAGNOSIS — Z9484 Stem cells transplant status: Secondary | ICD-10-CM | POA: Insufficient documentation

## 2019-06-15 DIAGNOSIS — K760 Fatty (change of) liver, not elsewhere classified: Secondary | ICD-10-CM | POA: Insufficient documentation

## 2019-06-15 DIAGNOSIS — I129 Hypertensive chronic kidney disease with stage 1 through stage 4 chronic kidney disease, or unspecified chronic kidney disease: Secondary | ICD-10-CM | POA: Diagnosis not present

## 2019-06-15 LAB — COMPREHENSIVE METABOLIC PANEL
ALT: 17 U/L (ref 0–44)
AST: 23 U/L (ref 15–41)
Albumin: 4.1 g/dL (ref 3.5–5.0)
Alkaline Phosphatase: 65 U/L (ref 38–126)
Anion gap: 7 (ref 5–15)
BUN: 14 mg/dL (ref 8–23)
CO2: 27 mmol/L (ref 22–32)
Calcium: 9 mg/dL (ref 8.9–10.3)
Chloride: 108 mmol/L (ref 98–111)
Creatinine, Ser: 1.35 mg/dL — ABNORMAL HIGH (ref 0.61–1.24)
GFR calc Af Amer: >60 mL/min (ref >60)
GFR calc non Af Amer: 55 mL/min — ABNORMAL LOW (ref >60)
Glucose, Bld: 130 mg/dL — ABNORMAL HIGH (ref 70–99)
Potassium: 3.6 mmol/L (ref 3.5–5.1)
Sodium: 142 mmol/L (ref 135–145)
Total Bilirubin: 0.7 mg/dL (ref 0.3–1.2)
Total Protein: 6.8 g/dL (ref 6.5–8.1)

## 2019-06-15 LAB — CBC WITH DIFFERENTIAL/PLATELET
Abs Immature Granulocytes: 0.02 10*3/uL (ref 0.00–0.07)
Basophils Absolute: 0 10*3/uL (ref 0.0–0.1)
Basophils Relative: 0 %
Eosinophils Absolute: 0.1 10*3/uL (ref 0.0–0.5)
Eosinophils Relative: 3 %
HCT: 28.2 % — ABNORMAL LOW (ref 39.0–52.0)
Hemoglobin: 9.7 g/dL — ABNORMAL LOW (ref 13.0–17.0)
Immature Granulocytes: 0 %
Lymphocytes Relative: 37 %
Lymphs Abs: 1.7 10*3/uL (ref 0.7–4.0)
MCH: 31.9 pg (ref 26.0–34.0)
MCHC: 34.4 g/dL (ref 30.0–36.0)
MCV: 92.8 fL (ref 80.0–100.0)
Monocytes Absolute: 0.5 10*3/uL (ref 0.1–1.0)
Monocytes Relative: 11 %
Neutro Abs: 2.3 10*3/uL (ref 1.7–7.7)
Neutrophils Relative %: 49 %
Platelets: 81 10*3/uL — ABNORMAL LOW (ref 150–400)
RBC: 3.04 MIL/uL — ABNORMAL LOW (ref 4.22–5.81)
RDW: 16.4 % — ABNORMAL HIGH (ref 11.5–15.5)
WBC: 4.6 10*3/uL (ref 4.0–10.5)
nRBC: 0 % (ref 0.0–0.2)

## 2019-06-15 IMAGING — US US EXTREM LOW VENOUS
1 series · 13 of 24 positions shown · non-contrast
Comparison: None.

CLINICAL DATA: 63-year-old male with multiple myeloma and bilateral
lower extremity edema.



[Series 1: us extrem low venous · 13 of 54 slices shown]
[im 1/54]
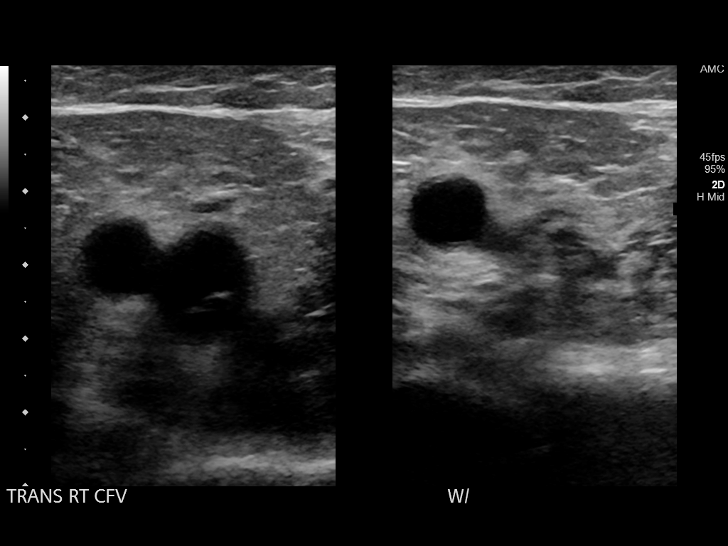
[im 5/54]
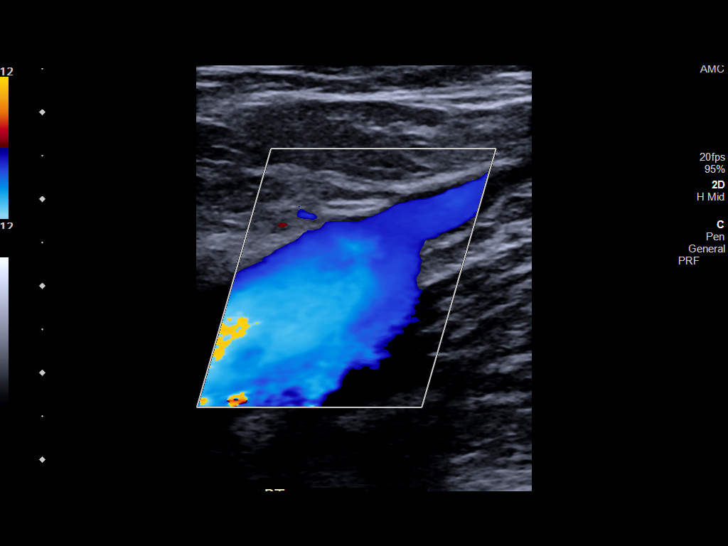
[im 10/54]
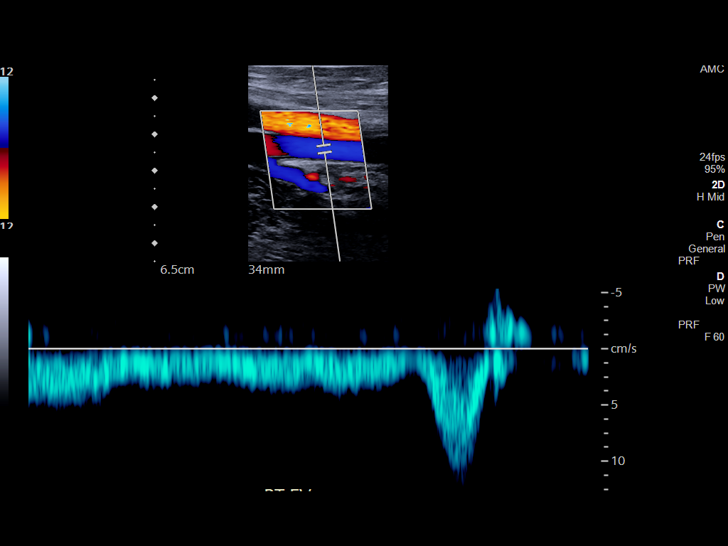
[im 14/54]
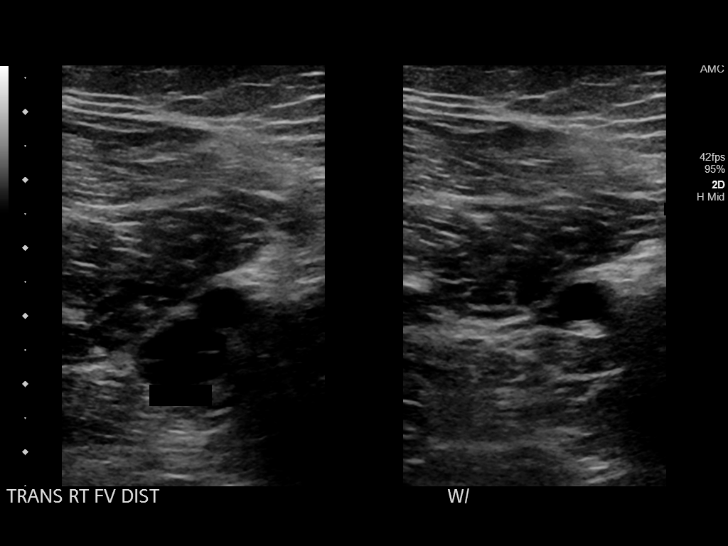
[im 19/54]
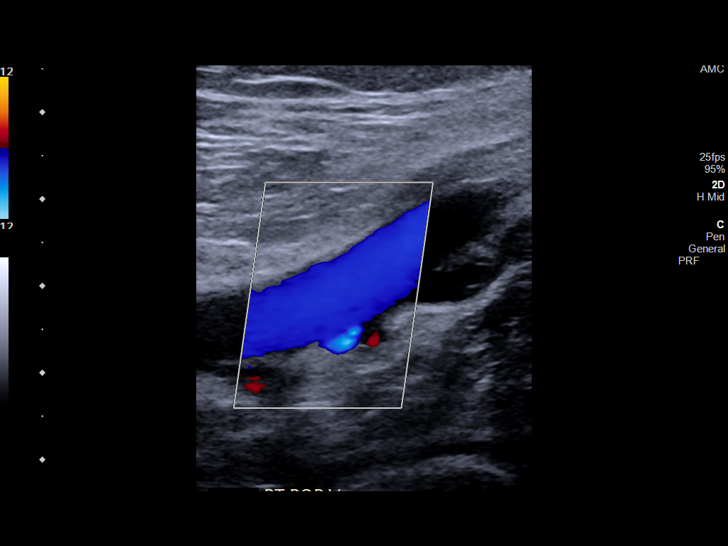
[im 24/54]
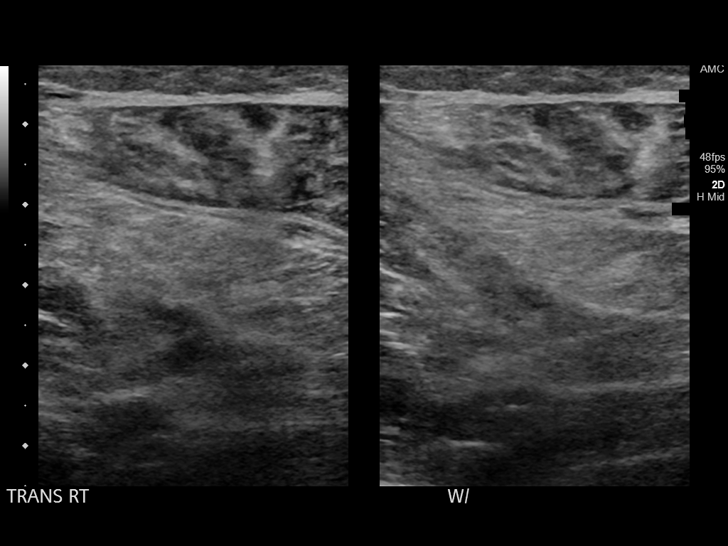
[im 28/54]
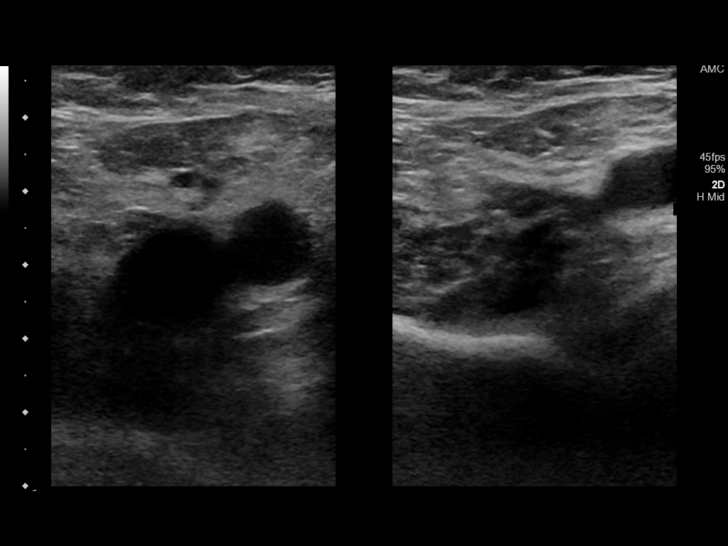
[im 30/54]
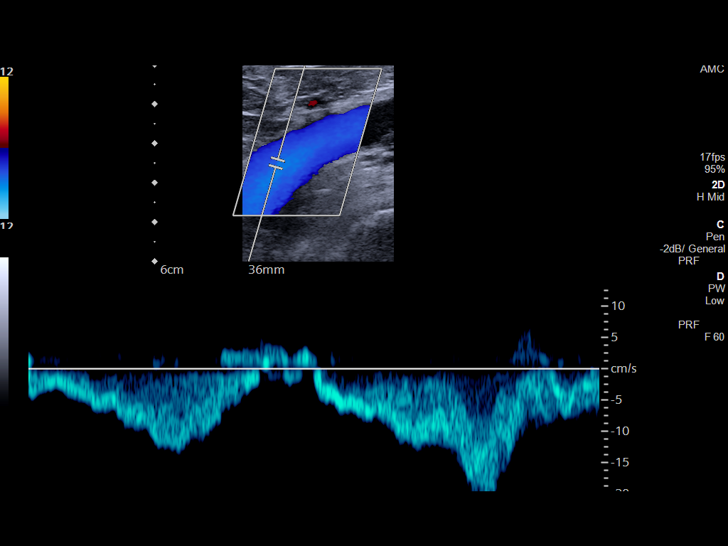
[im 35/54]
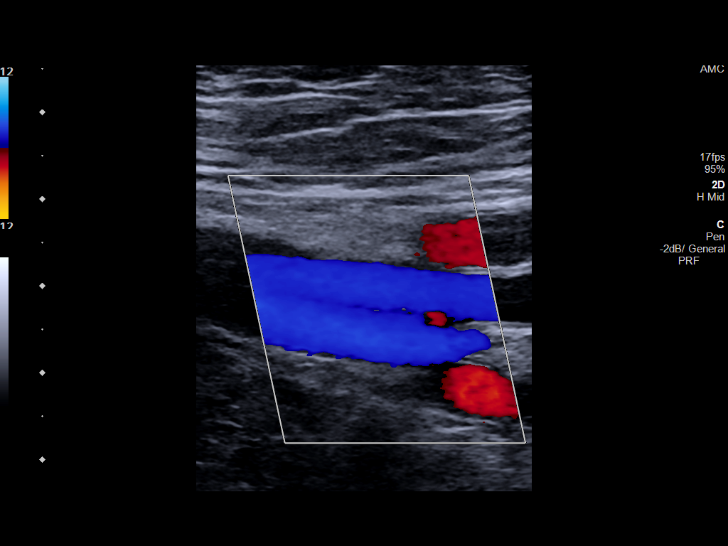
[im 40/54]
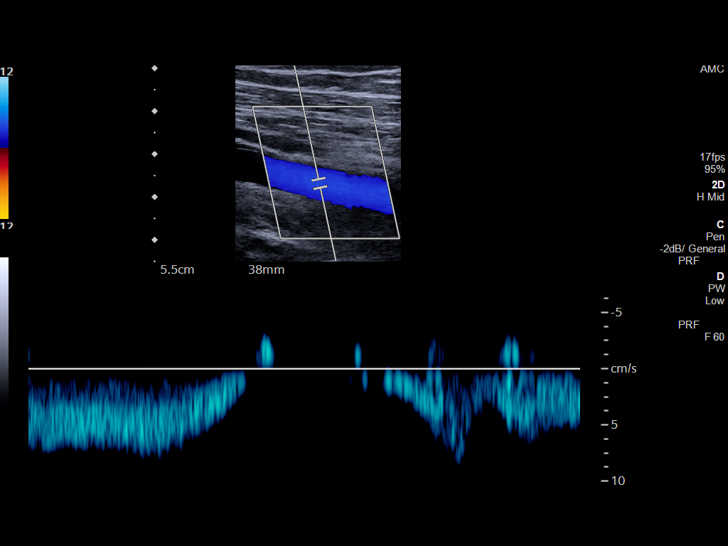
[im 44/54]
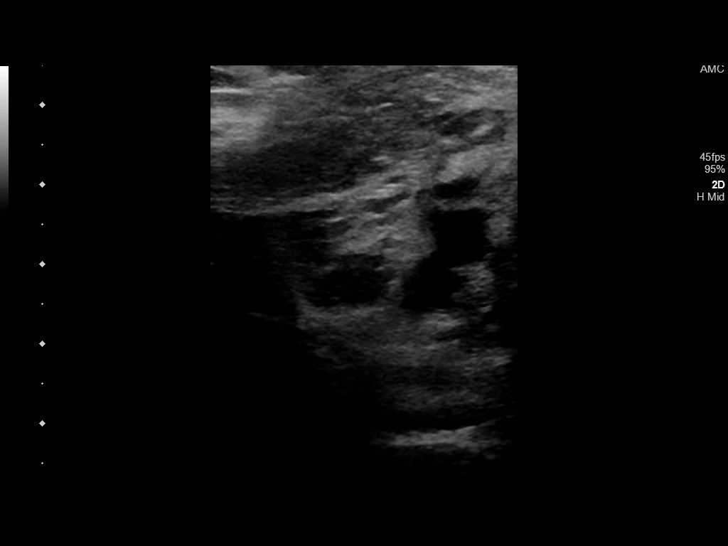
[im 49/54]
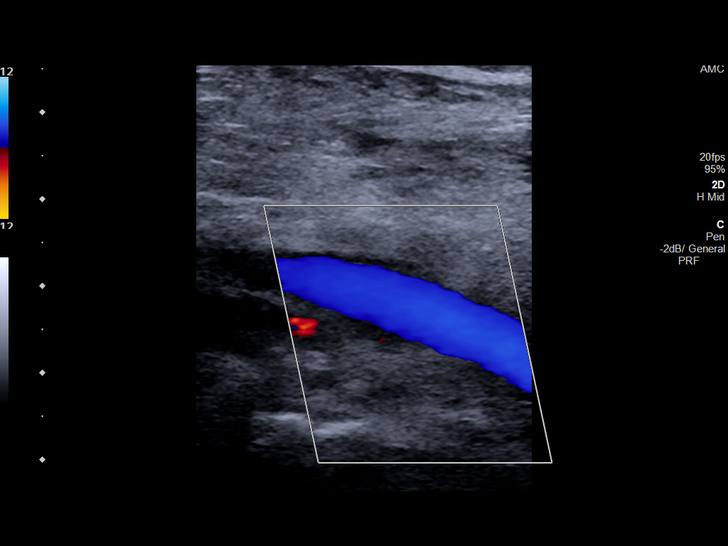
[im 54/54]
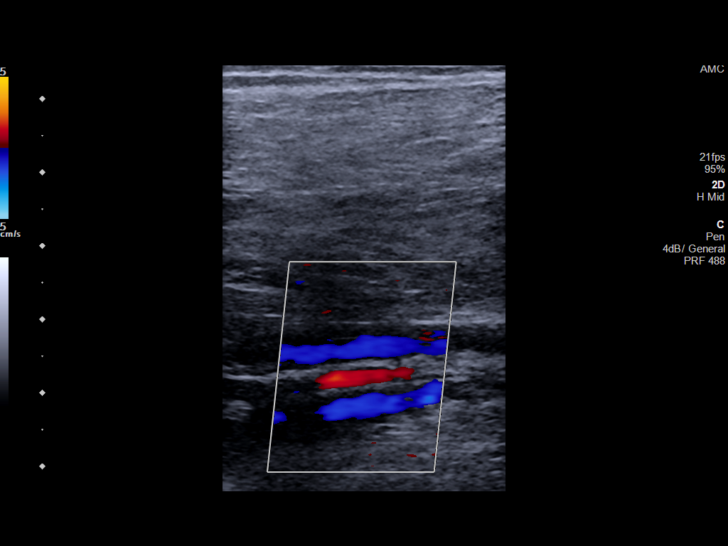

[13 of 24 positions shown; findings below may reference images not displayed]

FINDINGS: RIGHT LOWER EXTREMITY

Common Femoral Vein: No evidence of thrombus. Normal
compressibility, respiratory phasicity and response to augmentation.

Saphenofemoral Junction: No evidence of thrombus. Normal
compressibility and flow on color Doppler imaging.

Profunda Femoral Vein: No evidence of thrombus. Normal
compressibility and flow on color Doppler imaging.

Femoral Vein: No evidence of thrombus. Normal compressibility,
respiratory phasicity and response to augmentation.

Popliteal Vein: No evidence of thrombus. Normal compressibility,
respiratory phasicity and response to augmentation.

Calf Veins: No evidence of thrombus. Normal compressibility and flow
on color Doppler imaging.

Superficial Great Saphenous Vein: No evidence of thrombus. Normal
compressibility.

Venous Reflux:  None.

Other Findings:  None.

LEFT LOWER EXTREMITY

Common Femoral Vein: No evidence of thrombus. Normal
compressibility, respiratory phasicity and response to augmentation.

Saphenofemoral Junction: No evidence of thrombus. Normal
compressibility and flow on color Doppler imaging.

Profunda Femoral Vein: No evidence of thrombus. Normal
compressibility and flow on color Doppler imaging.

Femoral Vein: No evidence of thrombus. Normal compressibility,
respiratory phasicity and response to augmentation.

Popliteal Vein: No evidence of thrombus. Normal compressibility,
respiratory phasicity and response to augmentation.

Calf Veins: No evidence of thrombus. Normal compressibility and flow
on color Doppler imaging.

Superficial Great Saphenous Vein: No evidence of thrombus. Normal
compressibility.

Venous Reflux:  None.

Other Findings:  None.
IMPRESSION: No evidence of deep venous thrombosis in either lower extremity.

## 2019-06-16 ENCOUNTER — Encounter: Payer: Self-pay | Admitting: Oncology

## 2019-06-16 ENCOUNTER — Telehealth: Payer: Self-pay

## 2019-06-16 MED ORDER — LISINOPRIL 10 MG PO TABS: 10.0000 mg | ORAL_TABLET | Freq: Every day | ORAL | 3 refills | Status: DC

## 2019-06-16 NOTE — Telephone Encounter (Signed)
Dr.Duncan, I completely agree with the plan of starting Lisinopril, should not affect his counts.  He will have repeat cbc and cmp in 2 weeks.  Thank you.   Almyra Free, please reach out to patient and let him know that I agree with starting Lisinopril. Thank you.

## 2019-06-16 NOTE — Telephone Encounter (Signed)
Pt left v/m;pt saw Dr Tasia Catchings on 06/15/19; Dr Tasia Catchings is concerned about the clonidine not working well and causing swelling in legs and reducing pts platelets. Dr Tasia Catchings was supposed to text Dr Damita Dunnings. Pt has stopped taking the clonidine. Pt request cb after Dr Damita Dunnings reviews.[facility] [employer]

## 2019-06-16 NOTE — Telephone Encounter (Signed)
I did get a message from Dr. Tasia Catchings.  I have been considering options in the meantime.  I will route this message to Dr. Tasia Catchings in the meantime.  I appreciate the help of all involved.  I would stop clonidine for now.  I updated his med and allergy list.  I would try taking lisinopril 10 mg a day.  He can start that tomorrow.  His creatinine has improved on most recent lab check.  He needs to make sure he is drinking plenty of water, enough to keep his urine clear.  We will need to recheck his creatinine about 1-2 week after starting the medication, to make sure his creatinine has not gotten worse on the medicine.  If he is able to tolerate the lisinopril without significantly increasing his creatinine, then this may be a good option.  Please have him update me about his blood pressure in about 1 week, sooner if needed.  He has an appointment for labs in the middle of the month and I think this would be a reasonable time to recheck his creatinine.  The orders are already in the EMR per Dr. Tasia Catchings.

## 2019-06-16 NOTE — Telephone Encounter (Signed)
Pt notified as instructed and voiced understanding. Pt said if he has not heard from Dr Tasia Catchings by lunchtime on 06/17/19 pt will call Dr Collie Siad office because pt does not want to start lisinopril until hears from Dr Tasia Catchings. Juluis Rainier to Dr Damita Dunnings

## 2019-06-16 NOTE — Telephone Encounter (Signed)
Noted. Thanks.

## 2019-06-16 NOTE — Progress Notes (Signed)
Hematology/Oncology  Follow up note [facility] [phone] [phone]   Patient Care Team: Tonia Ghent, MD as PCP - General (Family Medicine)  REFERRING PROVIDER: Napoleon Form REASON FOR VISIT Follow up for multiple myeloma.  Groin pain.  HISTORY OF PRESENTING ILLNESS:  Luis Burke is a  63 y.o.  male with PMH listed below who was referred to me for evaluation of anemia and thrombocytopenia Associated signs and symptoms: Patient reports feeling fatigue.  Denies SOB with exertion.  Denies weight loss, easy bruising, hematochezia, hemoptysis, hematuria. Context: History of GI bleeding: Denies               History of Chronic kidney disease yes               History of autoimmune disease denies denies               History of hemolytic anemia.                Last colonoscopy: 07/20/2018 upper and lower endoscopy showed esophagitis, normal examined duodenum, 2 polyps removed in the ascending colon, resected and retrieved.  Otherwise normal. He drinks alcohol, usually a few beers during the weekends.  Denies any smoking history.   Intended weight loss 3 pounds for the past 3 months.  # 07/27/2018 multiple myeloma panel showed M protein of 0.1, IgG 662, IgA 49, IgM 7. Patient was called back to further labs done. 08/10/2018, free light chain ratio showed extremely high level of kappa free light chain 10,183, with a kappa lambda light chain ratio of 1414.31 LDH 164 Beta-2 microglobulin 5 Patient was called and discuss about results.  He was recommended to undergo bone marrow biopsy and PET scan. 08/19/2018 bone marrow biopsy showed hypercellular marrow 80%, involved by plasma cell neoplasm up to 95%.  Consistent with plasma cell myeloma.  FISH and cytogenetics are pending.  09/10/2018 skeletal survey showed questionable small lucent lesions within the midshaft of the humerus bilaterally.  Otherwise no suspicious focal lytic lesion or acute bone  abnormality. 08/25/2018 PET scan showed no definite hypermetabolic bone disease but CT findings are highly suspicious for numerous small myelomatous lesions involving the spine, sternum and scattered ribs.  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 He received preparative regimen with melphalan 200 mg/m on 04/22/2019 followed by autologous stem cell infusion on 04/23/2019. Currently he is transfusion independent.  Transfusion criteria with as needed hemoglobin less than 7.5 for platelet less than 10,000.  Transplant course was complicated with febrile neutropenia grade 3, treated with vancomycin and cephapirin until engraftment on 05/06/2019. Patient also had engraftment syndrome and had to be started on Solu-Medrol 25 mg twice daily on 05/05/2019.  Transition to Medrol Dosepak on 05/07/2019 to complete steroid taper as outpatient.  Patient is currently on acyclovir for 1 year after transplant, dose was reduced on 722 11/20/2018 due to renal function. Patient had a baseline CKD with creatinine running between 1.4-1.6.  He had acute on chronic kidney injury and creatinine went up to 2.2 on 722.  Fluid hydration was given and creatinine decreased to baseline 1.5-1.7. His Hickman catheter was removed.  Candida Cruris treated with topical clotrimazole 1% 3 times daily and to resolution..   INTERVAL HISTORY Luis Burke is a 63 y.o. male who has above history reviewed by me today for follow-up for multiple myeloma. Patient was seen by Dr. Tonia Brooms BMT team on 06/02/2019. His counts are recovering  well. 06/02/2019 hemoglobin 9.8, hematocrit 29.9, WBC 6.5, His magnesium is 1.3. Creatinine 1.5. Patient reports that his fatigue level has also improved. Blood pressure was trending up.  Recently started on clonidine. He noticed increased bilateral lower extremity edema. Denies any fever, chills, shortness of breath, abdominal pain, calf pain. Patient has not been started on  maintenance Revlimid yet. Mild lightheadedness  Review of Systems  Constitutional: Negative for appetite change, chills, fatigue, fever and unexpected weight change.  HENT:   Negative for hearing loss and voice change.   Eyes: Negative for eye problems and icterus.  Respiratory: Negative for chest tightness, cough and shortness of breath.   Cardiovascular: Positive for leg swelling. Negative for chest pain.  Gastrointestinal: Negative for abdominal distention and abdominal pain.  Endocrine: Negative for hot flashes.  Genitourinary: Negative for difficulty urinating, dysuria and frequency.   Musculoskeletal: Negative for arthralgias.  Skin: Negative for itching and rash.  Neurological: Positive for light-headedness. Negative for numbness.  Hematological: Negative for adenopathy. Does not bruise/bleed easily.  Psychiatric/Behavioral: Negative for confusion.    MEDICAL HISTORY:  Past Medical History:  Diagnosis Date  . AKI (acute kidney injury) ([place]) 01/06/2019  . Anemia   . Fatty liver    on u/s 07/2018  . Hyperlipidemia 03/2004  . Hypertension 1994  . Snores     SURGICAL HISTORY: Past Surgical History:  Procedure Laterality Date  . CATARACT EXTRACTION W/PHACO Right 03/04/2018   Procedure: CATARACT EXTRACTION PHACO AND INTRAOCULAR LENS PLACEMENT ([place])  RIGHT TORIC;  Surgeon: Leandrew Koyanagi, MD;  Location: [place];  Service: Ophthalmology;  Laterality: Right;  Per [place] no Toric Lens 1:45 5.3  . COLONOSCOPY WITH PROPOFOL N/A 07/20/2018   Procedure: COLONOSCOPY WITH PROPOFOL;  Surgeon: Jonathon Bellows, MD;  Location: [facility] ENDOSCOPY;  Service: Gastroenterology;  Laterality: N/A;  . ESOPHAGOGASTRODUODENOSCOPY (EGD) WITH PROPOFOL N/A 07/20/2018   Procedure: ESOPHAGOGASTRODUODENOSCOPY (EGD) WITH PROPOFOL;  Surgeon: Jonathon Bellows, MD;  Location[facility]COPY;  Service: Gastroenterology;  Laterality: N/A;  . EYE SURGERY Right   . HERNIA REPAIR    . L Lap herniorraphy  04/2000   . Left wrist ganglionectomy      SOCIAL HISTORY: Social History   Socioeconomic History  . Marital status: Single    Spouse name: Not on file  . Number of children: 1  . Years of education: Not on file  . Highest education level: Not on file  Occupational History  . Occupation: [employer]  Social Needs  . Financial resource strain: Not on file  . Food insecurity    Worry: Not on file    Inability: Not on file  . Transportation needs    Medical: Not on file    Non-medical: Not on file  Tobacco Use  . Smoking status: Never Smoker  . Smokeless tobacco: Never Used  . Tobacco comment: Occassionally  Substance and Sexual Activity  . Alcohol use: Not Currently    Alcohol/week: 3.0 standard drinks    Types: 3 Cans of beer per week  . Drug use: No  . Sexual activity: Not on file  Lifestyle  . Physical activity    Days per week: Not on file    Minutes per session: Not on file  . Stress: Not on file  Relationships  . Social Herbalist on phone: Not on file    Gets together: Not on file    Attends religious service: Not on file    Active member of club or  organization: Not on file    Attends meetings of clubs or organizations: Not on file    Relationship status: Not on file  . Intimate partner violence    Fear of current or ex partner: Not on file    Emotionally abused: Not on file    Physically abused: Not on file    Forced sexual activity: Not on file  Other Topics Concern  . Not on file  Social History Narrative   Divorced Dec 22, 2007, dating as of 12/21/2017   His daughter died 4 days after giving birth to the patient's granddaughter   Has joint custody of his dead daughter's child   Works at [employer] is [employer]    FAMILY HISTORY: Family History  Problem Relation Age of Onset  . Hypertension Mother   . Diabetes Mother   . Heart disease Mother        CAD  . Dementia Mother   . Hypertension Father   . Heart disease Father        MI 02/03  . Colon  cancer Father   . Hypertension Sister   . Hypertension Sister   . Hypertension Sister   . Prostate cancer Neg Hx     ALLERGIES:  is allergic to bactrim [sulfamethoxazole-trimethoprim]; clonidine derivatives; and tadalafil.  MEDICATIONS:  Current Outpatient Medications  Medication Sig Dispense Refill  . acetaminophen (TYLENOL) 325 MG tablet Take 650 mg by mouth every 6 (six) hours as needed.    Marland Kitchen acyclovir (ZOVIRAX) 400 MG tablet Take 1 tablet (400 mg total) by mouth 2 (two) times daily. 60 tablet 3  . amLODipine (NORVASC) 5 MG tablet Take 2 tablets (10 mg total) by mouth daily.    Marland Kitchen co-enzyme Q-10 50 MG capsule Take 50 mg by mouth daily.    . [facility] CLOTRIMAZOLE 1 % cream APPLY TO AFFECTED AREA 3 TIMES A DAY FOR 10 DAYS    . [facility] VITAMIN B12 1000 MCG tablet TAKE 1 TABLET BY MOUTH EVERY DAY 90 tablet 1  . cyclobenzaprine (FLEXERIL) 5 MG tablet Take 1 tablet (5 mg total) by mouth 3 (three) times daily as needed for muscle spasms. 30 tablet 0  . famotidine (PEPCID) 20 MG tablet Take 20 mg by mouth 2 (two) times daily.    Marland Kitchen LORazepam (ATIVAN) 1 MG tablet TAKE 1 TABLET (1 MG TOTAL) BY MOUTH EVERY 6 (SIX) HOURS AS NEEDED FOR ANXIETY (FOR NAUSEA)    . metoprolol succinate (TOPROL-XL) 50 MG 24 hr tablet Take 3-4 tablets (150-200 mg total) by mouth daily.    . ondansetron (ZOFRAN) 8 MG tablet Take 8 mg by mouth every 8 (eight) hours as needed. for nausea    . prochlorperazine (COMPAZINE) 10 MG tablet TAKE 1 TABLET (10 MG TOTAL) BY MOUTH EVERY 6 (SIX) HOURS AS NEEDED FOR NAUSEA    . lisinopril (ZESTRIL) 10 MG tablet Take 1 tablet (10 mg total) by mouth daily. 90 tablet 3   No current facility-administered medications for this visit.      PHYSICAL EXAMINATION: ECOG PERFORMANCE STATUS: 1 - Symptomatic but completely ambulatory Vitals:   06/15/19 1620  BP: (!) 155/97  Pulse: 80  Temp: 97.7 F (36.5 C)   Filed Weights   06/15/19 1620  Weight: 201 lb (91.2 kg)    Physical Exam  Constitutional:      General: He is not in acute distress. HENT:     Head: Normocephalic and atraumatic.  Eyes:     General: No scleral icterus.  Pupils: Pupils are equal, round, and reactive to light.  Neck:     Musculoskeletal: Normal range of motion and neck supple.  Cardiovascular:     Rate and Rhythm: Normal rate and regular rhythm.     Heart sounds: Normal heart sounds.  Pulmonary:     Effort: Pulmonary effort is normal. No respiratory distress.     Breath sounds: No wheezing.  Abdominal:     General: Bowel sounds are normal. There is no distension.     Palpations: Abdomen is soft. There is no mass.     Tenderness: There is no abdominal tenderness.  Musculoskeletal: Normal range of motion.        General: Swelling present. No deformity.     Comments: Bilateral 2+ pitting edema.  Skin:    General: Skin is warm and dry.     Findings: No erythema or rash.  Neurological:     Mental Status: He is alert and oriented to person, place, and time.     Cranial Nerves: No cranial nerve deficit.     Coordination: Coordination normal.  Psychiatric:        Behavior: Behavior normal.        Thought Content: Thought content normal.      LABORATORY DATA:  I have reviewed the data as listed Lab Results  Component Value Date   WBC 4.6 06/15/2019   HGB 9.7 (L) 06/15/2019   HCT 28.2 (L) 06/15/2019   MCV 92.8 06/15/2019   PLT 81 (L) 06/15/2019   Recent Labs    03/10/19 1306 05/18/19 1105 06/15/19 1345  NA 138 140 142  K 4.0 4.2 3.6  CL 107 107 108  CO2 22 25 27   GLUCOSE 119* 120* 130*  BUN 26* 15 14  CREATININE 1.30* 1.78* 1.35*  CALCIUM 9.2 8.9 9.0  GFRNONAA 58* 40* 55*  GFRAA >60 46* >60  PROT 7.1 6.6 6.8  ALBUMIN 4.3 4.1 4.1  AST 21 22 23   ALT 37 26 17  ALKPHOS 64 76 65  BILITOT 0.7 0.5 0.7   Iron/TIBC/Ferritin/ %Sat    Component Value Date/Time   IRON 74 07/01/2018 0944   TIBC 250 07/01/2018 0944   FERRITIN 357 07/01/2018 0944   IRONPCTSAT 30 07/01/2018  0944    Lab Results  Component Value Date   TOTALPROTELP 6.6 03/10/2019   ALBUMINELP 3.7 03/10/2019   A1GS 0.3 03/10/2019   A2GS 0.8 03/10/2019   BETS 1.3 03/10/2019   GAMS 0.6 03/10/2019   MSPIKE Not Observed 03/10/2019   SPEI Comment 03/10/2019   Lab Results  Component Value Date   KPAFRELGTCHN 64.1 (H) 03/10/2019   LAMBDASER 14.2 03/10/2019   KAPLAMBRATIO 4.51 (H) 03/10/2019    RADIOGRAPHIC STUDIES: I have personally reviewed the radiological images as listed and agreed with the findings in the report. 10/23/2018 FINDINGS: The heart size and mediastinal contours are within normal limits. Both lungs are clear. The visualized skeletal structures are unremarkable. IMPRESSION: No active cardiopulmonary disease.   ASSESSMENT & PLAN:  1. Multiple myeloma, remission status unspecified ([place])   2. 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 Anemia, unspecified type   4. Thrombocytopenia ([place])   5. CKD (chronic kidney disease) stage 3, GFR 30-59 ml/min (HCC)   6. Bilateral leg edema   7. Hypomagnesemia    #Light chain multiple myeloma beta 2 microglobulin 5 and normal albumin.  Stage II. cytogenetics showed normal male chromosome, MDS FISH panel showed trisomy 28- Standard Risk. S/p RVD x 9  Autologous bone marrow stem cell transplant at [facility] on 04/23/2019. Today's day +55 Patient is transfusion dependent now. Counts continue to improve.  Hemoglobin 9.7 today. He had complete normal platelet count 2 weeks ago 06/02/2019. Today his platelet count seems to be decreased to 81,000. Questionable drug side effect secondary to clonidine. I discussed with Dr. Damita Dunnings.  He is going to discontinue clonidine and switch to lisinopril. Patient is also on Toprol XL.  Advised patient to continue measure blood pressure at home.  Defer post transplant evaluation -Day 100 Bone marrow/ immunization to [place],   #Bilateral lower extremity edema. Check stat bilateral lower extremity ultrasound rule  out DVT. Ultrasound negative for DVT .  He does not have any shortness of breath or breathing difficulties. Continue to monitor. #CKD, recent episode of acute on chronic CKD. Labs reviewed.  Creatinine 1.35, close to his baseline.  Continue monitor.  #Prophylactic antibiotics patient is on acyclovir for 1 year post transplant. #Patient has antiemetics available at home for nausea or vomiting. #GI prophylaxis with Pepcid 20 mg daily. #Hypomagnesia, will check magnesium level. RTC: Repeat blood work in 2 weeks.   Earlie Server, MD, PhD Hematology Oncology [place] at [facility] 06/16/2019

## 2019-06-17 NOTE — Telephone Encounter (Addendum)
Pt notified as instructed and voiced understanding; pt has not heard from Dr Collie Siad office yet but with this note he will pick up lisinopril and start medication. FYI to Dr Damita Dunnings.

## 2019-06-17 NOTE — Telephone Encounter (Signed)
Patient notified that Dr Tasia Catchings is agreeable to starting Lisinopril.

## 2019-06-18 ENCOUNTER — Other Ambulatory Visit: Payer: Self-pay

## 2019-06-18 ENCOUNTER — Inpatient Hospital Stay: Payer: BC Managed Care – PPO

## 2019-06-18 DIAGNOSIS — K760 Fatty (change of) liver, not elsewhere classified: Secondary | ICD-10-CM | POA: Diagnosis not present

## 2019-06-18 DIAGNOSIS — D696 Thrombocytopenia, unspecified: Secondary | ICD-10-CM | POA: Diagnosis not present

## 2019-06-18 DIAGNOSIS — D649 Anemia, unspecified: Secondary | ICD-10-CM | POA: Diagnosis not present

## 2019-06-18 DIAGNOSIS — I129 Hypertensive chronic kidney disease with stage 1 through stage 4 chronic kidney disease, or unspecified chronic kidney disease: Secondary | ICD-10-CM | POA: Diagnosis not present

## 2019-06-18 DIAGNOSIS — E785 Hyperlipidemia, unspecified: Secondary | ICD-10-CM | POA: Diagnosis not present

## 2019-06-18 DIAGNOSIS — R1030 Lower abdominal pain, unspecified: Secondary | ICD-10-CM | POA: Diagnosis not present

## 2019-06-18 DIAGNOSIS — N183 Chronic kidney disease, stage 3 (moderate): Secondary | ICD-10-CM | POA: Diagnosis not present

## 2019-06-18 DIAGNOSIS — R600 Localized edema: Secondary | ICD-10-CM | POA: Diagnosis not present

## 2019-06-18 DIAGNOSIS — Z79899 Other long term (current) drug therapy: Secondary | ICD-10-CM | POA: Diagnosis not present

## 2019-06-18 DIAGNOSIS — Z9484 Stem cells transplant status: Secondary | ICD-10-CM | POA: Diagnosis not present

## 2019-06-18 DIAGNOSIS — C9000 Multiple myeloma not having achieved remission: Secondary | ICD-10-CM | POA: Diagnosis not present

## 2019-06-18 DIAGNOSIS — R5383 Other fatigue: Secondary | ICD-10-CM | POA: Diagnosis not present

## 2019-06-18 LAB — MAGNESIUM: Magnesium: 1.3 mg/dL — ABNORMAL LOW (ref 1.7–2.4)

## 2019-06-22 ENCOUNTER — Inpatient Hospital Stay (HOSPITAL_BASED_OUTPATIENT_CLINIC_OR_DEPARTMENT_OTHER): Payer: BC Managed Care – PPO | Admitting: Nurse Practitioner

## 2019-06-22 ENCOUNTER — Other Ambulatory Visit: Payer: Self-pay

## 2019-06-22 ENCOUNTER — Inpatient Hospital Stay: Payer: BC Managed Care – PPO

## 2019-06-22 ENCOUNTER — Telehealth: Payer: Self-pay

## 2019-06-22 ENCOUNTER — Other Ambulatory Visit: Payer: Self-pay | Admitting: Oncology

## 2019-06-22 ENCOUNTER — Telehealth: Payer: Self-pay | Admitting: *Deleted

## 2019-06-22 ENCOUNTER — Encounter: Payer: Self-pay | Admitting: Nurse Practitioner

## 2019-06-22 DIAGNOSIS — D696 Thrombocytopenia, unspecified: Secondary | ICD-10-CM | POA: Diagnosis not present

## 2019-06-22 DIAGNOSIS — R5383 Other fatigue: Secondary | ICD-10-CM | POA: Diagnosis not present

## 2019-06-22 DIAGNOSIS — K760 Fatty (change of) liver, not elsewhere classified: Secondary | ICD-10-CM | POA: Diagnosis not present

## 2019-06-22 DIAGNOSIS — I129 Hypertensive chronic kidney disease with stage 1 through stage 4 chronic kidney disease, or unspecified chronic kidney disease: Secondary | ICD-10-CM | POA: Diagnosis not present

## 2019-06-22 DIAGNOSIS — R7989 Other specified abnormal findings of blood chemistry: Secondary | ICD-10-CM

## 2019-06-22 DIAGNOSIS — R1030 Lower abdominal pain, unspecified: Secondary | ICD-10-CM | POA: Diagnosis not present

## 2019-06-22 DIAGNOSIS — D649 Anemia, unspecified: Secondary | ICD-10-CM | POA: Diagnosis not present

## 2019-06-22 DIAGNOSIS — R600 Localized edema: Secondary | ICD-10-CM | POA: Diagnosis not present

## 2019-06-22 DIAGNOSIS — C9000 Multiple myeloma not having achieved remission: Secondary | ICD-10-CM | POA: Diagnosis not present

## 2019-06-22 DIAGNOSIS — N179 Acute kidney failure, unspecified: Secondary | ICD-10-CM

## 2019-06-22 DIAGNOSIS — N183 Chronic kidney disease, stage 3 (moderate): Secondary | ICD-10-CM | POA: Diagnosis not present

## 2019-06-22 DIAGNOSIS — E785 Hyperlipidemia, unspecified: Secondary | ICD-10-CM | POA: Diagnosis not present

## 2019-06-22 DIAGNOSIS — Z9484 Stem cells transplant status: Secondary | ICD-10-CM | POA: Diagnosis not present

## 2019-06-22 DIAGNOSIS — R748 Abnormal levels of other serum enzymes: Secondary | ICD-10-CM

## 2019-06-22 DIAGNOSIS — Z79899 Other long term (current) drug therapy: Secondary | ICD-10-CM | POA: Diagnosis not present

## 2019-06-22 MED ORDER — LISINOPRIL 10 MG PO TABS: 20.0000 mg | ORAL_TABLET | Freq: Every day | ORAL | Status: DC

## 2019-06-22 MED ORDER — MAGNESIUM CHLORIDE 64 MG PO TBEC: 1.0000 | DELAYED_RELEASE_TABLET | Freq: Every day | ORAL | 0 refills | Status: DC

## 2019-06-22 MED ORDER — SODIUM CHLORIDE 0.9 % IV SOLN: 2.0000 g | Freq: Once | INTRAVENOUS | Status: DC

## 2019-06-22 MED ORDER — SODIUM CHLORIDE 0.9 % IV SOLN
Freq: Once | INTRAVENOUS | Status: AC
  Administered 2019-06-22: 14:00:00 via INTRAVENOUS
  Filled 2019-06-22: qty 250

## 2019-06-22 MED ORDER — MAGNESIUM SULFATE 2 GM/50ML IV SOLN
2.0000 g | Freq: Once | INTRAVENOUS | Status: AC
  Administered 2019-06-22: 2 g via INTRAVENOUS
  Filled 2019-06-22: qty 50

## 2019-06-22 NOTE — Telephone Encounter (Signed)
-----   Message from Earlie Server, MD sent at 06/22/2019  8:45 AM EDT ----- Mag is low, please arrange him to get IV magnesium 2g today. Also let him know that I have sent SLOW Mag Rx to his pharmacy. Repeat Mag later this week and +/- IV Mag thanks.

## 2019-06-22 NOTE — Telephone Encounter (Signed)
Patient agrees to appt at 1:00 today for IV mag and he is aware of rx sent to pharmacy.

## 2019-06-22 NOTE — Telephone Encounter (Signed)
Patient left a voicemail stating that his blood pressure is still running high even with the new medication Lisinopril. Patient stated this morning when he took his blood pressure it was 160/105, and he just took it and got 160/99. Patient stated the lowest reading that he has gotten is 160/99. Patient requested a call back.

## 2019-06-22 NOTE — Telephone Encounter (Signed)
It is going to take 5 days to have effect.  Verify that he has taken med for 5 days.   If he had been on for 5 days, then increase to 20mg  a day (2 tabs taken at the same time). I realize his BP is still up be we have to start slowly with the dose to prevent over-correction.  I'll await his f/u labs.  Update me on the higher dose in about 5-7 days.   Thanks.

## 2019-06-22 NOTE — Progress Notes (Signed)
Patient here for IV magnesium.Marland Kitchen He reports that his bp has been running high and his PCP recently started Lisinopril.  He has a call in to his PCP today regarding bp.

## 2019-06-22 NOTE — Telephone Encounter (Signed)
Patient advised.

## 2019-06-24 ENCOUNTER — Other Ambulatory Visit: Payer: Self-pay

## 2019-06-25 ENCOUNTER — Other Ambulatory Visit: Payer: Self-pay | Admitting: Oncology

## 2019-06-25 ENCOUNTER — Other Ambulatory Visit: Payer: Self-pay

## 2019-06-25 ENCOUNTER — Inpatient Hospital Stay: Payer: BC Managed Care – PPO

## 2019-06-25 DIAGNOSIS — R600 Localized edema: Secondary | ICD-10-CM | POA: Diagnosis not present

## 2019-06-25 DIAGNOSIS — K760 Fatty (change of) liver, not elsewhere classified: Secondary | ICD-10-CM | POA: Diagnosis not present

## 2019-06-25 DIAGNOSIS — R1030 Lower abdominal pain, unspecified: Secondary | ICD-10-CM | POA: Diagnosis not present

## 2019-06-25 DIAGNOSIS — D696 Thrombocytopenia, unspecified: Secondary | ICD-10-CM | POA: Diagnosis not present

## 2019-06-25 DIAGNOSIS — R5383 Other fatigue: Secondary | ICD-10-CM | POA: Diagnosis not present

## 2019-06-25 DIAGNOSIS — I129 Hypertensive chronic kidney disease with stage 1 through stage 4 chronic kidney disease, or unspecified chronic kidney disease: Secondary | ICD-10-CM | POA: Diagnosis not present

## 2019-06-25 DIAGNOSIS — C9000 Multiple myeloma not having achieved remission: Secondary | ICD-10-CM | POA: Diagnosis not present

## 2019-06-25 DIAGNOSIS — N183 Chronic kidney disease, stage 3 unspecified: Secondary | ICD-10-CM

## 2019-06-25 DIAGNOSIS — D649 Anemia, unspecified: Secondary | ICD-10-CM | POA: Diagnosis not present

## 2019-06-25 DIAGNOSIS — E785 Hyperlipidemia, unspecified: Secondary | ICD-10-CM | POA: Diagnosis not present

## 2019-06-25 DIAGNOSIS — Z9484 Stem cells transplant status: Secondary | ICD-10-CM | POA: Diagnosis not present

## 2019-06-25 DIAGNOSIS — Z79899 Other long term (current) drug therapy: Secondary | ICD-10-CM | POA: Diagnosis not present

## 2019-06-25 LAB — CBC WITH DIFFERENTIAL/PLATELET
Abs Immature Granulocytes: 0.02 10*3/uL (ref 0.00–0.07)
Basophils Absolute: 0 10*3/uL (ref 0.0–0.1)
Basophils Relative: 0 %
Eosinophils Absolute: 0.2 10*3/uL (ref 0.0–0.5)
Eosinophils Relative: 4 %
HCT: 30.9 % — ABNORMAL LOW (ref 39.0–52.0)
Hemoglobin: 10.6 g/dL — ABNORMAL LOW (ref 13.0–17.0)
Immature Granulocytes: 0 %
Lymphocytes Relative: 36 %
Lymphs Abs: 1.7 10*3/uL (ref 0.7–4.0)
MCH: 31.9 pg (ref 26.0–34.0)
MCHC: 34.3 g/dL (ref 30.0–36.0)
MCV: 93.1 fL (ref 80.0–100.0)
Monocytes Absolute: 0.5 10*3/uL (ref 0.1–1.0)
Monocytes Relative: 10 %
Neutro Abs: 2.5 10*3/uL (ref 1.7–7.7)
Neutrophils Relative %: 50 %
Platelets: 78 10*3/uL — ABNORMAL LOW (ref 150–400)
RBC: 3.32 MIL/uL — ABNORMAL LOW (ref 4.22–5.81)
RDW: 15.6 % — ABNORMAL HIGH (ref 11.5–15.5)
WBC: 4.8 10*3/uL (ref 4.0–10.5)
nRBC: 0 % (ref 0.0–0.2)

## 2019-06-25 LAB — MAGNESIUM: Magnesium: 1.6 mg/dL — ABNORMAL LOW (ref 1.7–2.4)

## 2019-06-25 LAB — COMPREHENSIVE METABOLIC PANEL
ALT: 15 U/L (ref 0–44)
AST: 23 U/L (ref 15–41)
Albumin: 4.5 g/dL (ref 3.5–5.0)
Alkaline Phosphatase: 58 U/L (ref 38–126)
Anion gap: 11 (ref 5–15)
BUN: 15 mg/dL (ref 8–23)
CO2: 24 mmol/L (ref 22–32)
Calcium: 9.3 mg/dL (ref 8.9–10.3)
Chloride: 107 mmol/L (ref 98–111)
Creatinine, Ser: 1.37 mg/dL — ABNORMAL HIGH (ref 0.61–1.24)
GFR calc Af Amer: >60 mL/min (ref >60)
GFR calc non Af Amer: 55 mL/min — ABNORMAL LOW (ref >60)
Glucose, Bld: 169 mg/dL — ABNORMAL HIGH (ref 70–99)
Potassium: 3.5 mmol/L (ref 3.5–5.1)
Sodium: 142 mmol/L (ref 135–145)
Total Bilirubin: 0.7 mg/dL (ref 0.3–1.2)
Total Protein: 6.7 g/dL (ref 6.5–8.1)

## 2019-06-25 MED ORDER — SODIUM CHLORIDE 0.9 % IV SOLN
2.0000 g | Freq: Once | INTRAVENOUS | Status: AC
  Administered 2019-06-25: 2 g via INTRAVENOUS
  Filled 2019-06-25: qty 4

## 2019-06-29 ENCOUNTER — Other Ambulatory Visit: Payer: BC Managed Care – PPO

## 2019-06-29 ENCOUNTER — Ambulatory Visit: Payer: BC Managed Care – PPO | Admitting: Oncology

## 2019-06-30 ENCOUNTER — Inpatient Hospital Stay: Payer: BC Managed Care – PPO | Admitting: Oncology

## 2019-06-30 ENCOUNTER — Inpatient Hospital Stay: Payer: BC Managed Care – PPO

## 2019-06-30 DIAGNOSIS — C9000 Multiple myeloma not having achieved remission: Secondary | ICD-10-CM | POA: Diagnosis not present

## 2019-07-03 ENCOUNTER — Other Ambulatory Visit: Payer: Self-pay | Admitting: Oncology

## 2019-07-03 DIAGNOSIS — C9000 Multiple myeloma not having achieved remission: Secondary | ICD-10-CM

## 2019-07-05 MED ORDER — HYDRALAZINE HCL 10 MG PO TABS: 10.0000 mg | ORAL_TABLET | Freq: Three times a day (TID) | ORAL | 1 refills | Status: DC

## 2019-07-05 NOTE — Telephone Encounter (Signed)
Pt left v/m requesting cb to discuss BP; I spoke with pt and pt has been taking Lisinopril 20 mg at the same time. BP 170/102 on 06/30/19. Pt is not at home and gave a range of recent bp  BP 150 -170/97-102. [facility] oncologist is concerned about kidney function; creatinine was 1.5 on 06/30/19 at [facility]. Pt request cb after Dr Damita Dunnings reviews.No CP,SOB,H/A or dizziness. Pt taking metoprolol 50 mg taking 4 tabs daily(pt has divided the dose and taken 4 tabs at same time with no improvement of BP) and amlodipine 5 mg taking 2 tabs at same time daily. [facility] [place].

## 2019-07-05 NOTE — Addendum Note (Signed)
Addended by: Tonia Ghent on: 07/05/2019 02:06 PM   Modules accepted: Orders

## 2019-07-05 NOTE — Telephone Encounter (Signed)
Would add on hydralazine.  Have him update me re: BP at the end of the week.  I intentionally dosed this on the lower end of the usual dose as a starting point.  Thanks.

## 2019-07-05 NOTE — Telephone Encounter (Signed)
Patient advised.  Patient expressed concern from his last OV with Dr. Lennox Pippins (?sp) at [facility] who told him he was concerned about his kidney function and the possibility that it might contributing to his BP issues. Patient said Dr. Lennox Pippins asked if he had a kidney doctor and asks if he should see a Nephrologist.

## 2019-07-06 ENCOUNTER — Telehealth: Payer: Self-pay | Admitting: *Deleted

## 2019-07-06 ENCOUNTER — Encounter: Payer: Self-pay | Admitting: Oncology

## 2019-07-06 NOTE — Addendum Note (Signed)
Addended by: Tonia Ghent on: 07/06/2019 06:44 AM   Modules accepted: Orders

## 2019-07-06 NOTE — Telephone Encounter (Signed)
Communicating with patient via MyChart message.

## 2019-07-06 NOTE — Telephone Encounter (Signed)
Patient called reporting that his PCP changed his B/P medicine to hydralazine and he is asking if it alright to take this with his chem medications. Please advise

## 2019-07-06 NOTE — Telephone Encounter (Signed)
Patient advised.

## 2019-07-06 NOTE — Telephone Encounter (Signed)
It is possible that the patient's kidney function can affect their blood pressure but I would not expect a creatinine of 1.5 to be an issue.  That said I am glad for the patient to see the kidney clinic.  I put in a referral.  I would still continue as planned and have him let me know about his blood pressure in the meantime.  Thanks.

## 2019-07-07 NOTE — Telephone Encounter (Signed)
Called patient to discuss his concerns.  He states that [facility] has not filled the Acyclovir and they said Dr. Tasia Catchings could continue prescribing.  I also advised him to contact [facility] and ask them about the Hydralazine.

## 2019-07-13 ENCOUNTER — Encounter: Payer: Self-pay | Admitting: Oncology

## 2019-07-13 ENCOUNTER — Other Ambulatory Visit: Payer: Self-pay

## 2019-07-13 ENCOUNTER — Inpatient Hospital Stay (HOSPITAL_BASED_OUTPATIENT_CLINIC_OR_DEPARTMENT_OTHER): Payer: BC Managed Care – PPO | Admitting: Oncology

## 2019-07-13 ENCOUNTER — Inpatient Hospital Stay: Payer: BC Managed Care – PPO

## 2019-07-13 VITALS — BP 130/85 | HR 71 | Temp 98.1°F | Resp 18 | Wt 199.8 lb

## 2019-07-13 DIAGNOSIS — D649 Anemia, unspecified: Secondary | ICD-10-CM | POA: Diagnosis not present

## 2019-07-13 DIAGNOSIS — K760 Fatty (change of) liver, not elsewhere classified: Secondary | ICD-10-CM | POA: Diagnosis not present

## 2019-07-13 DIAGNOSIS — C9000 Multiple myeloma not having achieved remission: Secondary | ICD-10-CM

## 2019-07-13 DIAGNOSIS — R1030 Lower abdominal pain, unspecified: Secondary | ICD-10-CM | POA: Diagnosis not present

## 2019-07-13 DIAGNOSIS — D696 Thrombocytopenia, unspecified: Secondary | ICD-10-CM

## 2019-07-13 DIAGNOSIS — N1831 Chronic kidney disease, stage 3a: Secondary | ICD-10-CM

## 2019-07-13 DIAGNOSIS — Z9484 Stem cells transplant status: Secondary | ICD-10-CM | POA: Diagnosis not present

## 2019-07-13 DIAGNOSIS — Z79899 Other long term (current) drug therapy: Secondary | ICD-10-CM | POA: Diagnosis not present

## 2019-07-13 DIAGNOSIS — N183 Chronic kidney disease, stage 3 (moderate): Secondary | ICD-10-CM

## 2019-07-13 DIAGNOSIS — R600 Localized edema: Secondary | ICD-10-CM | POA: Diagnosis not present

## 2019-07-13 DIAGNOSIS — I129 Hypertensive chronic kidney disease with stage 1 through stage 4 chronic kidney disease, or unspecified chronic kidney disease: Secondary | ICD-10-CM | POA: Diagnosis not present

## 2019-07-13 DIAGNOSIS — E785 Hyperlipidemia, unspecified: Secondary | ICD-10-CM | POA: Diagnosis not present

## 2019-07-13 DIAGNOSIS — R5383 Other fatigue: Secondary | ICD-10-CM | POA: Diagnosis not present

## 2019-07-13 LAB — CBC WITH DIFFERENTIAL/PLATELET
Abs Immature Granulocytes: 0.02 10*3/uL (ref 0.00–0.07)
Basophils Absolute: 0 10*3/uL (ref 0.0–0.1)
Basophils Relative: 0 %
Eosinophils Absolute: 0.1 10*3/uL (ref 0.0–0.5)
Eosinophils Relative: 3 %
HCT: 31 % — ABNORMAL LOW (ref 39.0–52.0)
Hemoglobin: 10.7 g/dL — ABNORMAL LOW (ref 13.0–17.0)
Immature Granulocytes: 0 %
Lymphocytes Relative: 35 %
Lymphs Abs: 1.9 10*3/uL (ref 0.7–4.0)
MCH: 32.4 pg (ref 26.0–34.0)
MCHC: 34.5 g/dL (ref 30.0–36.0)
MCV: 93.9 fL (ref 80.0–100.0)
Monocytes Absolute: 0.5 10*3/uL (ref 0.1–1.0)
Monocytes Relative: 10 %
Neutro Abs: 2.8 10*3/uL (ref 1.7–7.7)
Neutrophils Relative %: 52 %
Platelets: 79 10*3/uL — ABNORMAL LOW (ref 150–400)
RBC: 3.3 MIL/uL — ABNORMAL LOW (ref 4.22–5.81)
RDW: 14.8 % (ref 11.5–15.5)
WBC: 5.3 10*3/uL (ref 4.0–10.5)
nRBC: 0 % (ref 0.0–0.2)

## 2019-07-13 LAB — COMPREHENSIVE METABOLIC PANEL
ALT: 21 U/L (ref 0–44)
AST: 20 U/L (ref 15–41)
Albumin: 4.4 g/dL (ref 3.5–5.0)
Alkaline Phosphatase: 63 U/L (ref 38–126)
Anion gap: 8 (ref 5–15)
BUN: 17 mg/dL (ref 8–23)
CO2: 25 mmol/L (ref 22–32)
Calcium: 9.6 mg/dL (ref 8.9–10.3)
Chloride: 107 mmol/L (ref 98–111)
Creatinine, Ser: 1.34 mg/dL — ABNORMAL HIGH (ref 0.61–1.24)
GFR calc Af Amer: >60 mL/min (ref >60)
GFR calc non Af Amer: 56 mL/min — ABNORMAL LOW (ref >60)
Glucose, Bld: 108 mg/dL — ABNORMAL HIGH (ref 70–99)
Potassium: 3.7 mmol/L (ref 3.5–5.1)
Sodium: 140 mmol/L (ref 135–145)
Total Bilirubin: 0.7 mg/dL (ref 0.3–1.2)
Total Protein: 7 g/dL (ref 6.5–8.1)

## 2019-07-13 LAB — MAGNESIUM: Magnesium: 1.8 mg/dL (ref 1.7–2.4)

## 2019-07-13 NOTE — Progress Notes (Signed)
Hematology/Oncology  Follow up note [name] [phone] [phone]   Patient Care Team: Tonia Ghent, MD as PCP - General (Family Medicine)  REFERRING PROVIDER: Napoleon Form REASON FOR VISIT Follow up for multiple myeloma.  Groin pain.  HISTORY OF PRESENTING ILLNESS:  Luis Burke is a  63 y.o.  male with PMH listed below who was referred to me for evaluation of anemia and thrombocytopenia Associated signs and symptoms: Patient reports feeling fatigue.  Denies SOB with exertion.  Denies weight loss, easy bruising, hematochezia, hemoptysis, hematuria. Context: History of GI bleeding: Denies               History of Chronic kidney disease yes               History of autoimmune disease denies denies               History of hemolytic anemia.                Last colonoscopy: 07/20/2018 upper and lower endoscopy showed esophagitis, normal examined duodenum, 2 polyps removed in the ascending colon, resected and retrieved.  Otherwise normal. He drinks alcohol, usually a few beers during the weekends.  Denies any smoking history.   Intended weight loss 3 pounds for the past 3 months.  # 07/27/2018 multiple myeloma panel showed M protein of 0.1, IgG 662, IgA 49, IgM 7. Patient was called back to further labs done. 08/10/2018, free light chain ratio showed extremely high level of kappa free light chain 10,183, with a kappa lambda light chain ratio of 1414.31 LDH 164 Beta-2 microglobulin 5 Patient was called and discuss about results.  He was recommended to undergo bone marrow biopsy and PET scan. 08/19/2018 bone marrow biopsy showed hypercellular marrow 80%, involved by plasma cell neoplasm up to 95%.  Consistent with plasma cell myeloma.  FISH and cytogenetics are pending.  09/10/2018 skeletal survey showed questionable small lucent lesions within the midshaft of the humerus bilaterally.  Otherwise no suspicious focal lytic lesion or acute bone  abnormality. 08/25/2018 PET scan showed no definite hypermetabolic bone disease but CT findings are highly suspicious for numerous small myelomatous lesions involving the spine, sternum and scattered ribs.  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 He received preparative regimen with melphalan 200 mg/m on 04/22/2019 followed by autologous stem cell infusion on 04/23/2019. Currently he is transfusion independent.  Transfusion criteria with as needed hemoglobin less than 7.5 for platelet less than 10,000.  Transplant course was complicated with febrile neutropenia grade 3, treated with vancomycin and cephapirin until engraftment on 05/06/2019. Patient also had engraftment syndrome and had to be started on Solu-Medrol 25 mg twice daily on 05/05/2019.  Transition to Medrol Dosepak on 05/07/2019 to complete steroid taper as outpatient.  Patient is currently on acyclovir for 1 year after transplant, dose was reduced on 722 11/20/2018 due to renal function. Patient had a baseline CKD with creatinine running between 1.4-1.6.  He had acute on chronic kidney injury and creatinine went up to 2.2 on 722.  Fluid hydration was given and creatinine decreased to baseline 1.5-1.7. His Hickman catheter was removed.  Candida Cruris treated with topical clotrimazole 1% 3 times daily and to resolution..   INTERVAL HISTORY Luis Burke is a 63 y.o. male who has above history reviewed by me today for follow-up for multiple myeloma. Patient was seen by Dr. Tonia Brooms BMT team on 06/30/2019. Patient was recommended to  obtain flu shot this fall 2020 patient plans to discuss with primary care provider. Patient was recommended to have revaccination at [place] begins at 12 months post transplant Patient reports feeling well.  Hydralazine has been added to optimize blood pressure control. Day in the clinic blood pressure is 130/85. Patient reports doing well at baseline. Denies any fever, chills,  shortness of breath, abdominal pain, or calf pain. Denies any lightheadedness. Bilateral lower extremity leg swelling has improved. Review of Systems  Constitutional: Negative for appetite change, chills, fatigue, fever and unexpected weight change.  HENT:   Negative for hearing loss and voice change.   Eyes: Negative for eye problems and icterus.  Respiratory: Negative for chest tightness, cough and shortness of breath.   Cardiovascular: Positive for leg swelling. Negative for chest pain.  Gastrointestinal: Negative for abdominal distention and abdominal pain.  Endocrine: Negative for hot flashes.  Genitourinary: Negative for difficulty urinating, dysuria and frequency.   Musculoskeletal: Negative for arthralgias.  Skin: Negative for itching and rash.  Neurological: Negative for light-headedness and numbness.  Hematological: Negative for adenopathy. Does not bruise/bleed easily.  Psychiatric/Behavioral: Negative for confusion.    MEDICAL HISTORY:  Past Medical History:  Diagnosis Date  . AKI (acute kidney injury) ([place]) 01/06/2019  . Anemia   . Fatty liver    on u/s 07/2018  . Hyperlipidemia 03/2004  . Hypertension 1994  . Snores     SURGICAL HISTORY: Past Surgical History:  Procedure Laterality Date  . CATARACT EXTRACTION W/PHACO Right 03/04/2018   Procedure: CATARACT EXTRACTION PHACO AND INTRAOCULAR LENS PLACEMENT ([place])  RIGHT TORIC;  Surgeon: Leandrew Koyanagi, MD;  Location: [place];  Service: Ophthalmology;  Laterality: Right;  [place] no Toric Lens 1:45 5.3  . COLONOSCOPY WITH PROPOFOL N/A 07/20/2018   Procedure: COLONOSCOPY WITH PROPOFOL;  Surgeon: Jonathon Bellows, MD;  Location: [facility] ENDOSCOPY;  Service: Gastroenterology;  Laterality: N/A;  . ESOPHAGOGASTRODUODENOSCOPY (EGD) WITH PROPOFOL N/A 07/20/2018   Procedure: ESOPHAGOGASTRODUODENOSCOPY (EGD) WITH PROPOFOL;  Surgeon: Jonathon Bellows, MD;  Location: [facility] ENDOSCOPY;  Service: Gastroenterology;  Laterality: N/A;   . EYE SURGERY Right   . HERNIA REPAIR    . L Lap herniorraphy  04/2000  . Left wrist ganglionectomy      SOCIAL HISTORY: Social History   Socioeconomic History  . Marital status: Single    Spouse name: Not on file  . Number of children: 1  . Years of education: Not on file  . Highest education level: Not on file  Occupational History  . Occupation: [employer]  Social Needs  . Financial resource strain: Not on file  . Food insecurity    Worry: Not on file    Inability: Not on file  . Transportation needs    Medical: Not on file    Non-medical: Not on file  Tobacco Use  . Smoking status: Never Smoker  . Smokeless tobacco: Never Used  . Tobacco comment: Occassionally  Substance and Sexual Activity  . Alcohol use: Not Currently    Alcohol/week: 3.0 standard drinks    Types: 3 Cans of beer per week  . Drug use: No  . Sexual activity: Not on file  Lifestyle  . Physical activity    Days per week: Not on file    Minutes per session: Not on file  . Stress: Not on file  Relationships  . Social Herbalist on phone: Not on file    Gets together: Not on file  Attends religious service: Not on file    Active member of club or organization: Not on file    Attends meetings of clubs or organizations: Not on file    Relationship status: Not on file  . Intimate partner violence    Fear of current or ex partner: Not on file    Emotionally abused: Not on file    Physically abused: Not on file    Forced sexual activity: Not on file  Other Topics Concern  . Not on file  Social History Narrative   Divorced 11-24-07, dating as of November 23, 2017   His daughter died 4 days after giving birth to the patient's granddaughter   Has joint custody of his dead daughter's child   Works at [employer] is [employer]    FAMILY HISTORY: Family History  Problem Relation Age of Onset  . Hypertension Mother   . Diabetes Mother   . Heart disease Mother        CAD  . Dementia Mother   .  Hypertension Father   . Heart disease Father        MI 02/03  . Colon cancer Father   . Hypertension Sister   . Hypertension Sister   . Hypertension Sister   . Prostate cancer Neg Hx     ALLERGIES:  is allergic to bactrim [sulfamethoxazole-trimethoprim]; clonidine derivatives; and tadalafil.  MEDICATIONS:  Current Outpatient Medications  Medication Sig Dispense Refill  . acetaminophen (TYLENOL) 325 MG tablet Take 650 mg by mouth every 6 (six) hours as needed.    Marland Kitchen acyclovir (ZOVIRAX) 400 MG tablet TAKE 1 TABLET BY MOUTH TWICE DAILY 60 tablet 0  . amLODipine (NORVASC) 5 MG tablet Take 2 tablets (10 mg total) by mouth daily.    . [facility] CLOTRIMAZOLE 1 % cream APPLY TO AFFECTED AREA 3 TIMES A DAY FOR 10 DAYS    . [facility] VITAMIN B12 1000 MCG tablet TAKE 1 TABLET BY MOUTH EVERY DAY 90 tablet 1  . cyclobenzaprine (FLEXERIL) 5 MG tablet Take 1 tablet (5 mg total) by mouth 3 (three) times daily as needed for muscle spasms. 30 tablet 0  . famotidine (PEPCID) 20 MG tablet Take 20 mg by mouth 2 (two) times daily.    . hydrALAZINE (APRESOLINE) 10 MG tablet Take 1 tablet (10 mg total) by mouth 3 (three) times daily. 90 tablet 1  . lisinopril (ZESTRIL) 10 MG tablet Take 2 tablets (20 mg total) by mouth daily.    Marland Kitchen LORazepam (ATIVAN) 1 MG tablet TAKE 1 TABLET (1 MG TOTAL) BY MOUTH EVERY 6 (SIX) HOURS AS NEEDED FOR ANXIETY (FOR NAUSEA)    . magnesium chloride (SLOW-MAG) 64 MG TBEC SR tablet Take 1 tablet (64 mg total) by mouth daily. 60 tablet 0  . metoprolol succinate (TOPROL-XL) 50 MG 24 hr tablet Take 3-4 tablets (150-200 mg total) by mouth daily.    . ondansetron (ZOFRAN) 8 MG tablet Take 8 mg by mouth every 8 (eight) hours as needed. for nausea    . prochlorperazine (COMPAZINE) 10 MG tablet TAKE 1 TABLET (10 MG TOTAL) BY MOUTH EVERY 6 (SIX) HOURS AS NEEDED FOR NAUSEA    . co-enzyme Q-10 50 MG capsule Take 50 mg by mouth daily.     No current facility-administered medications for this visit.       PHYSICAL EXAMINATION: ECOG PERFORMANCE STATUS: 1 - Symptomatic but completely ambulatory Vitals:   07/13/19 0927  BP: 130/85  Pulse: 71  Resp: 18  Temp: 98.1  F (36.7 C)   Filed Weights   07/13/19 0927  Weight: 199 lb 12.8 oz (90.6 kg)    Physical Exam Constitutional:      General: He is not in acute distress. HENT:     Head: Normocephalic and atraumatic.  Eyes:     General: No scleral icterus.    Pupils: Pupils are equal, round, and reactive to light.  Neck:     Musculoskeletal: Normal range of motion and neck supple.  Cardiovascular:     Rate and Rhythm: Normal rate and regular rhythm.     Heart sounds: Normal heart sounds.  Pulmonary:     Effort: Pulmonary effort is normal. No respiratory distress.     Breath sounds: No wheezing.  Abdominal:     General: Bowel sounds are normal. There is no distension.     Palpations: Abdomen is soft. There is no mass.     Tenderness: There is no abdominal tenderness.  Musculoskeletal: Normal range of motion.        General: No deformity.     Comments: Bilateral 1+ pitting edema.  Skin:    General: Skin is warm and dry.     Findings: No erythema or rash.  Neurological:     Mental Status: He is alert and oriented to person, place, and time.     Cranial Nerves: No cranial nerve deficit.     Coordination: Coordination normal.  Psychiatric:        Behavior: Behavior normal.        Thought Content: Thought content normal.      LABORATORY DATA:  I have reviewed the data as listed Lab Results  Component Value Date   WBC 5.3 07/13/2019   HGB 10.7 (L) 07/13/2019   HCT 31.0 (L) 07/13/2019   MCV 93.9 07/13/2019   PLT 79 (L) 07/13/2019   Recent Labs    06/15/19 1345 06/25/19 0810 07/13/19 0833  NA 142 142 140  K 3.6 3.5 3.7  CL 108 107 107  CO2 _0 GLUCOSE 130* 169* 108*  BUN _1 CREATININE 1.35* 1.37* 1.34*  CALCIUM 9.0 9.3 9.6  GFRNONAA 55* 55* 56*  GFRAA >60 >60 >60  PROT 6.8 6.7 7.0  ALBUMIN 4.1  4.5 4.4  AST _2 ALT _3 ALKPHOS 65 58 63  BILITOT 0.7 0.7 0.7   Iron/TIBC/Ferritin/ %Sat    Component Value Date/Time   IRON 74 07/01/2018 0944   TIBC 250 07/01/2018 0944   FERRITIN 357 07/01/2018 0944   IRONPCTSAT 30 07/01/2018 0944    Lab Results  Component Value Date   TOTALPROTELP 6.6 03/10/2019   ALBUMINELP 3.7 03/10/2019   A1GS 0.3 03/10/2019   A2GS 0.8 03/10/2019   BETS 1.3 03/10/2019   GAMS 0.6 03/10/2019   MSPIKE Not Observed 03/10/2019   SPEI Comment 03/10/2019   Lab Results  Component Value Date   KPAFRELGTCHN 64.1 (H) 03/10/2019   LAMBDASER 14.2 03/10/2019   KAPLAMBRATIO 4.51 (H) 03/10/2019    RADIOGRAPHIC STUDIES: I have personally reviewed the radiological images as listed and agreed with the findings in the report. 10/23/2018 FINDINGS: The heart size and mediastinal contours are within normal limits. Both lungs are clear. The visualized skeletal structures are unremarkable. IMPRESSION: No active cardiopulmonary disease.   ASSESSMENT & PLAN:  1. Multiple myeloma, remission status unspecified ([place])   2. 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 Thrombocytopenia ([place])   4. Hypomagnesemia  5. CKD (chronic kidney disease) stage 3, GFR 30-59 ml/min    #Light chain multiple myeloma beta 2 microglobulin 5 and normal albumin.  Stage II. cytogenetics showed normal male chromosome, MDS FISH panel showed trisomy 50- Standard Risk. S/p RVD x 9 Autologous bone marrow stem cell transplant at [facility] on 04/23/2019. Today's day +82 Patient is transfusion independent now.  # Thrombocytopenia, platelet has been stable around 79,000.  # Anemia, hemoglobin stable at 10.7.  # HTN, is better controlled on hydralazine, amlodipine, toprol XL.  # Post bone marrow transplant care.  -Continue Acyclovir 439m BID.  -I discussed with [facility] hematology Dr.Choi and he recommends plans as listed below.  -patient to be started on Ixazomib maintenance: -Ixazomib 320mon  D1/D8/D15 out of 28 day cycle.  -patient will start re-vaccination 12 months post transplant.  -Post transplant bone marrow biopsy if clinically indicated.  - #Bilateral lower extremity edema. Improved.   #CKD, creatinine remains stable, Cr 1.34. avoid nephrotoxins.  #Hypomagnesia, continue oral slow Mag 6466maily, Mag is 1.8, no need for IV magnesium.   Follow up in 4 weeks.   ZhoEarlie ServerD, PhD Hematology Oncology [facility]29/2020

## 2019-07-13 NOTE — Progress Notes (Signed)
Patient does not offer any problems today.  

## 2019-07-19 ENCOUNTER — Encounter: Payer: Self-pay | Admitting: Oncology

## 2019-07-21 ENCOUNTER — Telehealth: Payer: Self-pay | Admitting: Pharmacist

## 2019-07-21 ENCOUNTER — Telehealth: Payer: Self-pay | Admitting: Pharmacy Technician

## 2019-07-21 DIAGNOSIS — C9000 Multiple myeloma not having achieved remission: Secondary | ICD-10-CM

## 2019-07-21 MED ORDER — IXAZOMIB CITRATE 3 MG PO CAPS: ORAL_CAPSULE | ORAL | 3 refills | Status: DC

## 2019-07-21 MED ORDER — ACYCLOVIR 400 MG PO TABS: 400.0000 mg | ORAL_TABLET | Freq: Two times a day (BID) | ORAL | 11 refills | Status: DC

## 2019-07-21 NOTE — Telephone Encounter (Signed)
Oral Oncology Patient Advocate Encounter  Received notification from [facility] that prior authorization for Luis Burke is required.  PA submitted on CoverMyMeds Key [id number] Status is pending  Oral Oncology Clinic will continue to follow.   [name] Phone [phone] Fax [phone] 07/21/2019 2:21 PM

## 2019-07-21 NOTE — Telephone Encounter (Signed)
Oral Oncology Pharmacist Encounter  Received new prescription for Ninlaro (ixazomib) for multiple myeloma maintenance therapy post transplant, planned duration until disease progression or unacceptable drug toxicity.  CBC/CMP from 07/13/2019 assessed, no relevant lab abnormalities. Prescription dose and frequency assessed.   Current medication list in Epic reviewed, no DDIs with ixazomib identified.  Prescription has been e-scribed to the [facility] for benefits analysis and approval.  Oral Oncology Clinic will continue to follow for insurance authorization, copayment issues, initial counseling and start date.  Darl Pikes, PharmD, BCPS, [facility] Hematology/Oncology Clinical Pharmacist [facility]/AP [facility] [facility] [facility] [phone]  07/21/2019 12:41 PM

## 2019-07-21 NOTE — Progress Notes (Signed)
DISCONTINUE ON PATHWAY REGIMEN - Multiple Myeloma and Other Plasma Cell Dyscrasias     A cycle is every 21 days:     Bortezomib      Lenalidomide      Dexamethasone   **Always confirm dose/schedule in your pharmacy ordering system**  REASON: Other Reason PRIOR TREATMENT: [id number]: VRd (Bortezomib 1.3 mg/m2 Subcut D1, 8, 15 + Lenalidomide 25 mg + Dexamethasone 40 mg) q21 Days x 4-6 Cycles Maximum Prior to Stem Cell [facility]vest TREATMENT RESPONSE: N/A - Adjuvant Therapy  START ON PATHWAY REGIMEN - Multiple Myeloma and Other Plasma Cell Dyscrasias     A cycle is every 28 days:     Ixazomib      Ixazomib   **Always confirm dose/schedule in your pharmacy ordering system**  Patient Characteristics: Maintenance Therapy R-ISS Staging: II Disease Classification: Maintenance Therapy Intent of Therapy: Non-Curative / Palliative Intent, Discussed with Patient

## 2019-07-21 NOTE — Telephone Encounter (Signed)
Oral Oncology Patient Advocate Encounter  Prior Authorization for Kennieth Rad has been approved.    PA# [id number] Effective dates: 07/21/2019 through 07/19/2020  Patient must use [facility] per insurance.  Oral Oncology Clinic will continue to follow.   [place] Patient [place] Phone [phone] Fax [phone] 07/21/2019 3:06 PM

## 2019-07-23 DIAGNOSIS — C9000 Multiple myeloma not having achieved remission: Secondary | ICD-10-CM | POA: Diagnosis not present

## 2019-07-26 ENCOUNTER — Telehealth: Payer: Self-pay | Admitting: Pharmacy Technician

## 2019-07-26 ENCOUNTER — Telehealth: Payer: Self-pay

## 2019-07-26 ENCOUNTER — Telehealth: Payer: Self-pay | Admitting: *Deleted

## 2019-07-26 NOTE — Telephone Encounter (Signed)
Patient is needing some assistance with paying for Ninlaro.

## 2019-07-26 NOTE — Telephone Encounter (Signed)
Sent patient a response via MyChart.

## 2019-07-26 NOTE — Telephone Encounter (Signed)
Oral Oncology Patient Advocate Encounter   Was successful in obtaining a copay card for Ninlaro.  This copay card will make the patients copay $10.00.  I have spoken with the patient.    The billing information is as follows and has been shared with [facility].   RxBin: [id number] PCN: Loyalty Member ID: [id number] Group ID: [id number]     Luis Burke [name] Phone [phone] Fax [phone] 07/26/2019 12:16 PM

## 2019-07-26 NOTE — Telephone Encounter (Signed)
Patient called stating that his oncologist told him to call his PCP to schedule a flu shot. Appointment scheduled for 08/03/19. Patient stated that his blood pressure has been running in the 150's/90-92 since the medication change. Patient stated that he wants to hold off on the referral to the  kidney doctor until he gets more information from his specialist. Patient stated that he has an upcoming appointment scheduled with his oncologist on 08/10/19. Patient stated that he thinks that his kidney levels are okay at this point.

## 2019-07-26 NOTE — Telephone Encounter (Signed)
Yes! I signed the patient up for a copay card to reduce his copay.  I have called [facility] and gave them the billing information.  They will update billing and contact the patient to set up delivery.

## 2019-07-27 NOTE — Telephone Encounter (Signed)
Also called to inform patient on 07/26/2019.

## 2019-07-27 NOTE — Telephone Encounter (Signed)
Please call patient.  I realize that those pressures are little higher than goal but if he is coming in to see me in the near future, I would hold off on medication changes at this point.  We can recheck his kidney function here at the office visit if needed.  I would feel better about rechecking the patient first before changing his medications.  If he has significant changes in the meantime or severe blood pressure elevations then let me know.  Thanks.

## 2019-07-28 ENCOUNTER — Encounter: Payer: Self-pay | Admitting: Pharmacist

## 2019-07-28 ENCOUNTER — Other Ambulatory Visit: Payer: Self-pay | Admitting: Family Medicine

## 2019-07-28 NOTE — Telephone Encounter (Signed)
I attempted to contact patient to see if he would be willing to see Dr. Damita Dunnings on 10/20, and receive flu shot at that office visit. I was unable to reach patient, and his VM box was full. I will try again later.

## 2019-07-28 NOTE — Telephone Encounter (Signed)
Patient returned phone call. He states that he can come in on 10/20 for an office visit with Dr. Damita Dunnings. Patient has been scheduled for that day at 9:30. Thanks!

## 2019-07-28 NOTE — Telephone Encounter (Signed)
I apologize-I thought the appointment here on the 20th was with me, not solely for a flu shot.  Can his appointment on the 20th be changed to an office visit with me?  We can do the flu shot at the office visit.  Thanks.

## 2019-07-28 NOTE — Telephone Encounter (Signed)
Pt left v/m returning call and request cb. 

## 2019-07-28 NOTE — Telephone Encounter (Signed)
I contacted patient and advised him of Dr. Josefine Class recommendations. Patient verbalized understanding and stated he will continue to monitor his BP and let us know if anything changes. Patient has an appt to get flu shot at our office on 10/20, and follows up with oncologist on 10/27.  Dr. Damita Dunnings, I do not see where patient has a follow up with you. When do you recommend that patient be seen with you? Let me know and I will schedule. Thanks!

## 2019-07-28 NOTE — Telephone Encounter (Signed)
Called patient but voicemail is full and I was not able to leave a message. Will need to try again later.

## 2019-08-03 ENCOUNTER — Ambulatory Visit: Payer: BC Managed Care – PPO

## 2019-08-03 ENCOUNTER — Other Ambulatory Visit: Payer: Self-pay

## 2019-08-03 ENCOUNTER — Encounter: Payer: Self-pay | Admitting: Family Medicine

## 2019-08-03 ENCOUNTER — Ambulatory Visit: Payer: BC Managed Care – PPO | Admitting: Family Medicine

## 2019-08-03 VITALS — BP 142/80 | HR 78 | Temp 98.0°F | Ht 70.0 in | Wt 200.4 lb

## 2019-08-03 DIAGNOSIS — Z9481 Bone marrow transplant status: Secondary | ICD-10-CM | POA: Diagnosis not present

## 2019-08-03 DIAGNOSIS — C9000 Multiple myeloma not having achieved remission: Secondary | ICD-10-CM

## 2019-08-03 DIAGNOSIS — Z23 Encounter for immunization: Secondary | ICD-10-CM | POA: Diagnosis not present

## 2019-08-03 DIAGNOSIS — I10 Essential (primary) hypertension: Secondary | ICD-10-CM

## 2019-08-03 MED ORDER — HYDRALAZINE HCL 10 MG PO TABS: 10.0000 mg | ORAL_TABLET | Freq: Three times a day (TID) | ORAL | 3 refills | Status: DC

## 2019-08-03 MED ORDER — CYANOCOBALAMIN 1000 MCG PO TABS: 1000.0000 ug | ORAL_TABLET | Freq: Every day | ORAL | 3 refills | Status: DC

## 2019-08-03 MED ORDER — METOPROLOL SUCCINATE ER 50 MG PO TB24: 200.0000 mg | ORAL_TABLET | Freq: Every day | ORAL | Status: DC

## 2019-08-03 MED ORDER — LISINOPRIL 20 MG PO TABS: 20.0000 mg | ORAL_TABLET | Freq: Every day | ORAL | 3 refills | Status: DC

## 2019-08-03 NOTE — Patient Instructions (Signed)
I am very glad to get to see you.  Flu shot today.  Don't change your meds for now.  Take care.  Glad to see you.

## 2019-08-03 NOTE — Progress Notes (Signed)
He has f/u with hematology pending.  Status post bone marrow transplant.  No fevers.  We talked about his situation, with the plan to get a flu shot today and he will gradually get revaccinated per protocol through hematology.  We talked about Covid cautions and infection cautions in general.  Hypertension:    Using medication without problems or lightheadedness: yes Chest pain with exertion:no Edema:no Short of breath:no BP reasonable today.   Recent labs d/w pt.    nsaid caution d/w pt.  He is taking tylenol prn for neck pain. Posterior midline neck pain. He has tried changing pillows, that helped some.  No trauma.  See following phone note.  His girlfriend moved in and they are doing well.  D/w pt.    Knot noted on L arch.  Not ttp.  Not enlarging.   Meds, vitals, and allergies reviewed.   PMH and SH reviewed  ROS: Per HPI unless specifically indicated in ROS section   GEN: nad, alert and oriented HEENT: ncat NECK: supple w/o LA CV: rrr. PULM: ctab, no inc wob ABD: soft, +bs EXT: no edema SKIN: no acute rash He has some benign-appearing scar tissue on the arch of the left foot, on the plantar side.  Not tender not erythematous.

## 2019-08-04 ENCOUNTER — Telehealth: Payer: Self-pay | Admitting: Family Medicine

## 2019-08-04 ENCOUNTER — Encounter: Payer: Self-pay | Admitting: Oncology

## 2019-08-04 ENCOUNTER — Encounter: Payer: Self-pay | Admitting: Family Medicine

## 2019-08-04 DIAGNOSIS — Z9481 Bone marrow transplant status: Secondary | ICD-10-CM | POA: Insufficient documentation

## 2019-08-04 NOTE — Assessment & Plan Note (Addendum)
His blood pressure is reasonable.  Recent labs discussed with patient.  Creatinine has been stable on recent checks.  No change in meds at this point.  He agrees.  He can update me as needed.  >25 minutes spent in face to face time with patient, >50% spent in counselling or coordination of care.

## 2019-08-04 NOTE — Telephone Encounter (Signed)
Please call patient.  In the midst of the other items we discussed yesterday, I did not specifically talk to him about the plan regarding his episodic neck pain.  I think this is likely a benign musculoskeletal issue and it should gradually get better.  I think is reasonable to use Tylenol as needed.  I would try using ice versus heat and gently stretch and I think it will still gradually get better.  Update me as needed.  Thanks.

## 2019-08-04 NOTE — Assessment & Plan Note (Signed)
We talked about his situation.  Flu vaccine done today.  He will get revaccinated per protocol through hematology.  We talked about general infection prevention and Covid pandemic specifically.  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  He would be high risk, discussed.  I want him to get input from the [facility] clinic.  He agrees.  He is feeling well otherwise.  I greatly appreciate the help of all involved.

## 2019-08-04 NOTE — Assessment & Plan Note (Signed)
Status post bone marrow transplant.  See transplant discussion.

## 2019-08-04 NOTE — Telephone Encounter (Signed)
Patient advised and verbalized understanding 

## 2019-08-09 ENCOUNTER — Encounter: Payer: Self-pay | Admitting: *Deleted

## 2019-08-09 ENCOUNTER — Telehealth: Payer: Self-pay

## 2019-08-09 NOTE — Progress Notes (Signed)
Patient is coming in for follow up, he mentions some  neck pain.

## 2019-08-09 NOTE — Telephone Encounter (Signed)
Letter faxed as requested.   Requesting that a letter be drafted and sent to [employer] explaining that his immune system is currently suppressed due to his diagnosis and treatment. He is requesting that it be faxed to [employer] at [phone] with his claim [id number] referenced also.

## 2019-08-10 ENCOUNTER — Inpatient Hospital Stay: Payer: BC Managed Care – PPO | Attending: Oncology

## 2019-08-10 ENCOUNTER — Ambulatory Visit
Admission: RE | Admit: 2019-08-10 | Discharge: 2019-08-10 | Disposition: A | Discharge status: Home / Self Care | Payer: BC Managed Care – PPO | Source: Ambulatory Visit | Attending: Oncology | Admitting: Oncology

## 2019-08-10 ENCOUNTER — Encounter: Payer: Self-pay | Admitting: Oncology

## 2019-08-10 ENCOUNTER — Inpatient Hospital Stay: Payer: BC Managed Care – PPO

## 2019-08-10 ENCOUNTER — Other Ambulatory Visit: Payer: Self-pay

## 2019-08-10 ENCOUNTER — Inpatient Hospital Stay (HOSPITAL_BASED_OUTPATIENT_CLINIC_OR_DEPARTMENT_OTHER): Payer: BC Managed Care – PPO | Admitting: Oncology

## 2019-08-10 VITALS — BP 138/86 | HR 81 | Temp 97.7°F | Resp 18 | Wt 200.7 lb

## 2019-08-10 DIAGNOSIS — I129 Hypertensive chronic kidney disease with stage 1 through stage 4 chronic kidney disease, or unspecified chronic kidney disease: Secondary | ICD-10-CM | POA: Insufficient documentation

## 2019-08-10 DIAGNOSIS — Z9481 Bone marrow transplant status: Secondary | ICD-10-CM | POA: Insufficient documentation

## 2019-08-10 DIAGNOSIS — D649 Anemia, unspecified: Secondary | ICD-10-CM | POA: Diagnosis not present

## 2019-08-10 DIAGNOSIS — C9000 Multiple myeloma not having achieved remission: Secondary | ICD-10-CM

## 2019-08-10 DIAGNOSIS — M542 Cervicalgia: Secondary | ICD-10-CM | POA: Insufficient documentation

## 2019-08-10 DIAGNOSIS — N1831 Chronic kidney disease, stage 3a: Secondary | ICD-10-CM | POA: Diagnosis not present

## 2019-08-10 DIAGNOSIS — R1030 Lower abdominal pain, unspecified: Secondary | ICD-10-CM | POA: Diagnosis not present

## 2019-08-10 DIAGNOSIS — C9001 Multiple myeloma in remission: Secondary | ICD-10-CM

## 2019-08-10 DIAGNOSIS — D696 Thrombocytopenia, unspecified: Secondary | ICD-10-CM

## 2019-08-10 DIAGNOSIS — K760 Fatty (change of) liver, not elsewhere classified: Secondary | ICD-10-CM | POA: Diagnosis not present

## 2019-08-10 DIAGNOSIS — R5383 Other fatigue: Secondary | ICD-10-CM | POA: Insufficient documentation

## 2019-08-10 DIAGNOSIS — Z79899 Other long term (current) drug therapy: Secondary | ICD-10-CM | POA: Diagnosis not present

## 2019-08-10 DIAGNOSIS — N189 Chronic kidney disease, unspecified: Secondary | ICD-10-CM | POA: Diagnosis not present

## 2019-08-10 DIAGNOSIS — E785 Hyperlipidemia, unspecified: Secondary | ICD-10-CM | POA: Diagnosis not present

## 2019-08-10 DIAGNOSIS — Z5111 Encounter for antineoplastic chemotherapy: Secondary | ICD-10-CM

## 2019-08-10 LAB — CBC WITH DIFFERENTIAL/PLATELET
Abs Immature Granulocytes: 0.01 10*3/uL (ref 0.00–0.07)
Basophils Absolute: 0 10*3/uL (ref 0.0–0.1)
Basophils Relative: 0 %
Eosinophils Absolute: 0.3 10*3/uL (ref 0.0–0.5)
Eosinophils Relative: 4 %
HCT: 33.4 % — ABNORMAL LOW (ref 39.0–52.0)
Hemoglobin: 11.6 g/dL — ABNORMAL LOW (ref 13.0–17.0)
Immature Granulocytes: 0 %
Lymphocytes Relative: 29 %
Lymphs Abs: 1.7 10*3/uL (ref 0.7–4.0)
MCH: 33 pg (ref 26.0–34.0)
MCHC: 34.7 g/dL (ref 30.0–36.0)
MCV: 95.2 fL (ref 80.0–100.0)
Monocytes Absolute: 0.5 10*3/uL (ref 0.1–1.0)
Monocytes Relative: 9 %
Neutro Abs: 3.4 10*3/uL (ref 1.7–7.7)
Neutrophils Relative %: 58 %
Platelets: 95 10*3/uL — ABNORMAL LOW (ref 150–400)
RBC: 3.51 MIL/uL — ABNORMAL LOW (ref 4.22–5.81)
RDW: 13.6 % (ref 11.5–15.5)
WBC: 5.9 10*3/uL (ref 4.0–10.5)
nRBC: 0 % (ref 0.0–0.2)

## 2019-08-10 LAB — COMPREHENSIVE METABOLIC PANEL
ALT: 19 U/L (ref 0–44)
AST: 22 U/L (ref 15–41)
Albumin: 4.6 g/dL (ref 3.5–5.0)
Alkaline Phosphatase: 75 U/L (ref 38–126)
Anion gap: 9 (ref 5–15)
BUN: 21 mg/dL (ref 8–23)
CO2: 23 mmol/L (ref 22–32)
Calcium: 9.5 mg/dL (ref 8.9–10.3)
Chloride: 108 mmol/L (ref 98–111)
Creatinine, Ser: 1.4 mg/dL — ABNORMAL HIGH (ref 0.61–1.24)
GFR calc Af Amer: >60 mL/min (ref >60)
GFR calc non Af Amer: 53 mL/min — ABNORMAL LOW (ref >60)
Glucose, Bld: 112 mg/dL — ABNORMAL HIGH (ref 70–99)
Potassium: 3.6 mmol/L (ref 3.5–5.1)
Sodium: 140 mmol/L (ref 135–145)
Total Bilirubin: 0.7 mg/dL (ref 0.3–1.2)
Total Protein: 6.9 g/dL (ref 6.5–8.1)

## 2019-08-10 LAB — MAGNESIUM: Magnesium: 1.8 mg/dL (ref 1.7–2.4)

## 2019-08-10 IMAGING — CR DG CERVICAL SPINE COMPLETE 4+V
1 series · 6 of 6 positions shown · non-contrast
Comparison: PET-CT [DATE].

CLINICAL DATA: Neck pain.  Multiple myeloma.

EXAM:
CERVICAL SPINE - COMPLETE 4+ VIEW

[Series 1: dg cervical spine complete · 0.14mm/px · 6 of 6 slices shown]
[im 1/6]
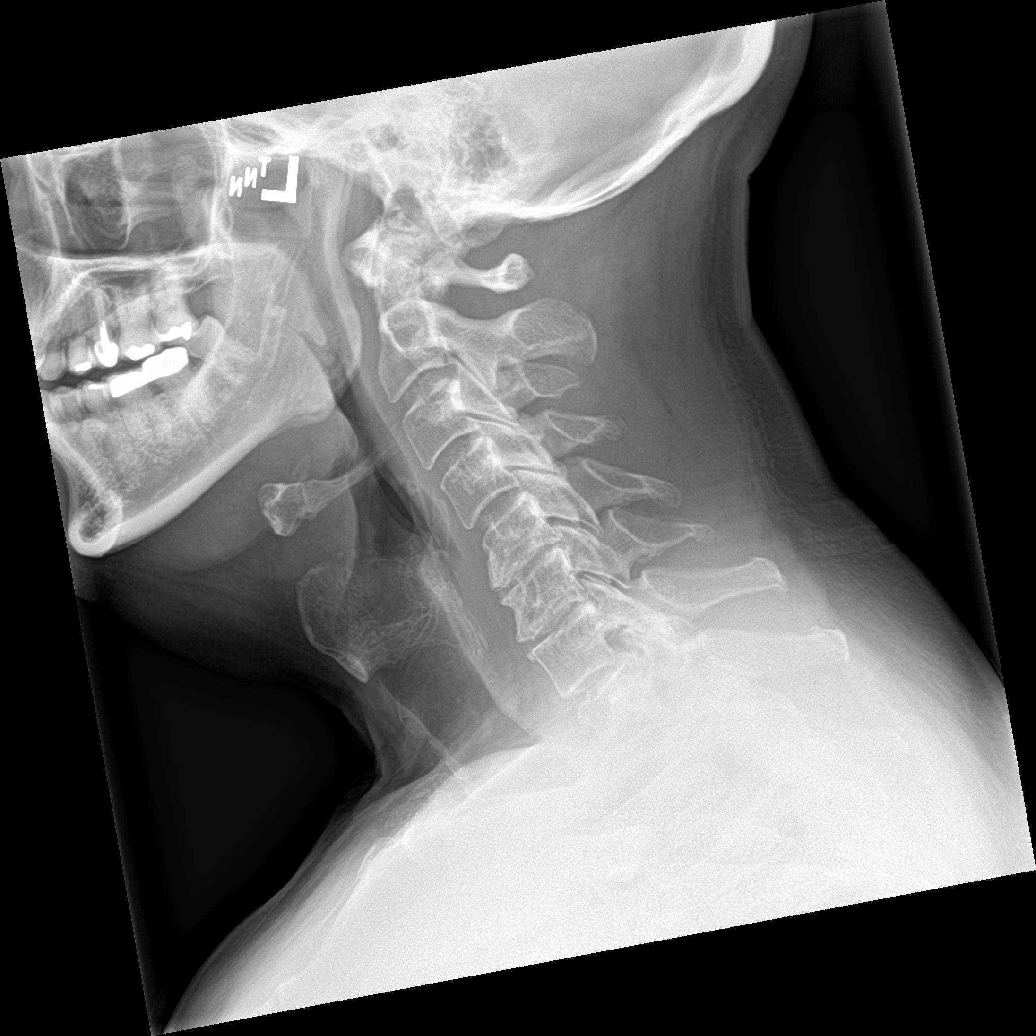
[im 2/6]
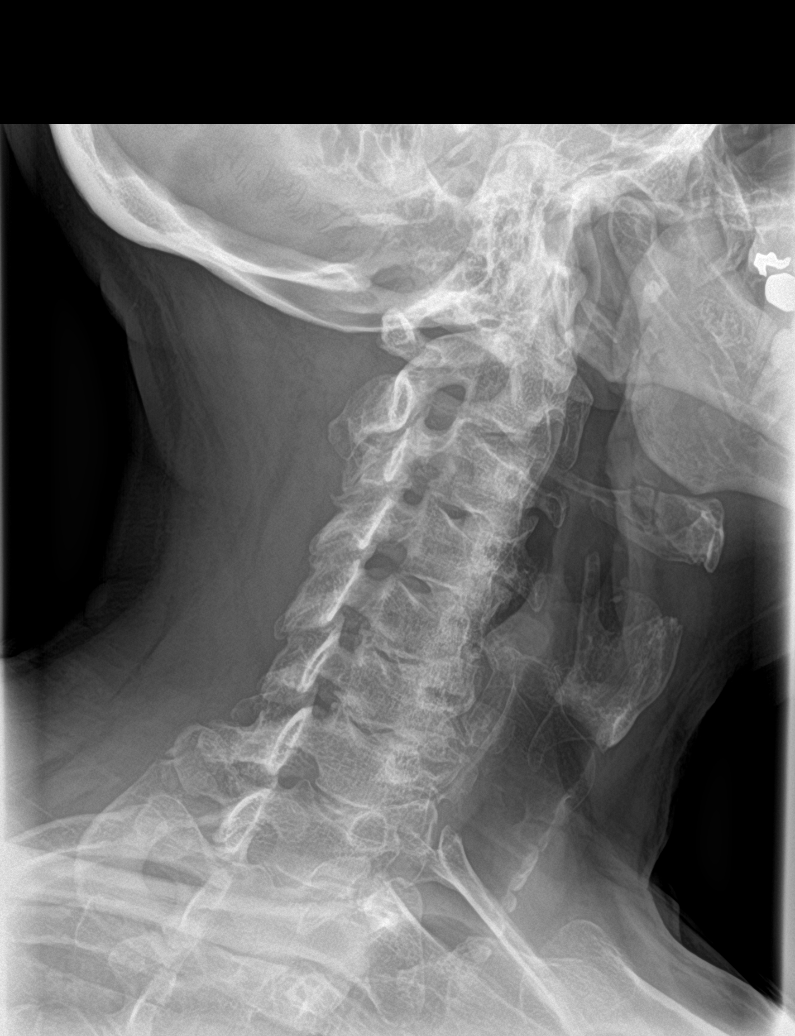
[im 3/6]
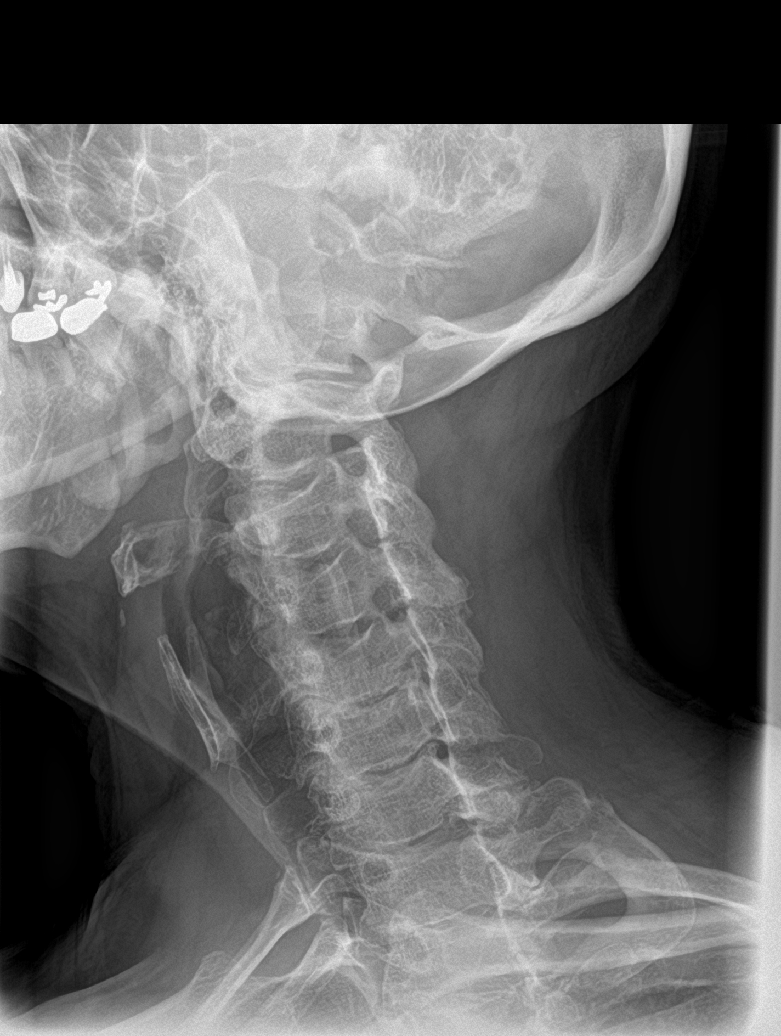
[im 4/6]
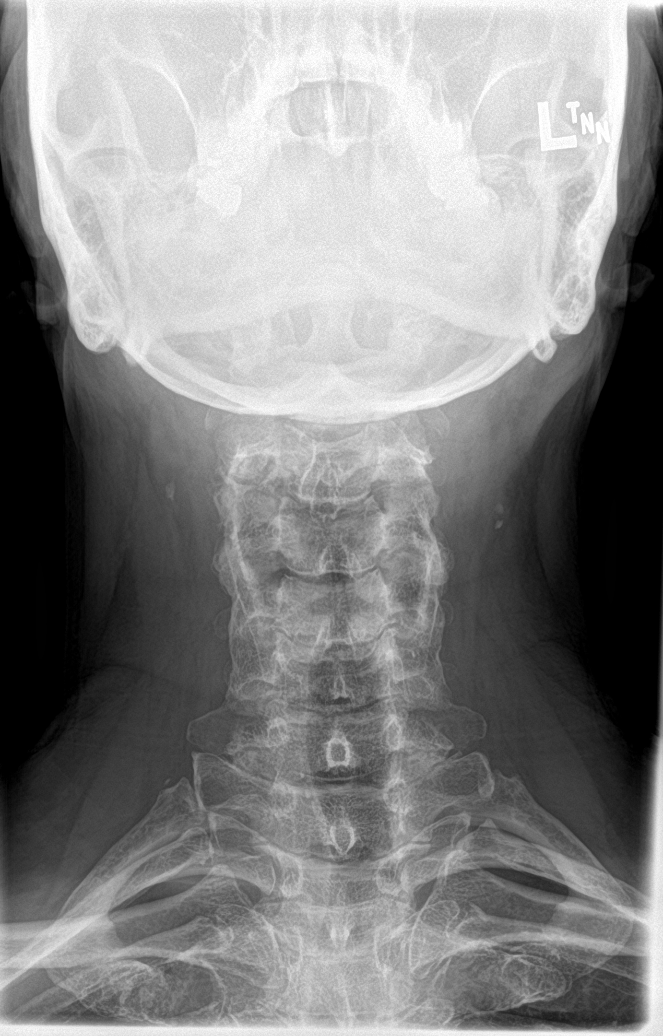
[im 5/6]
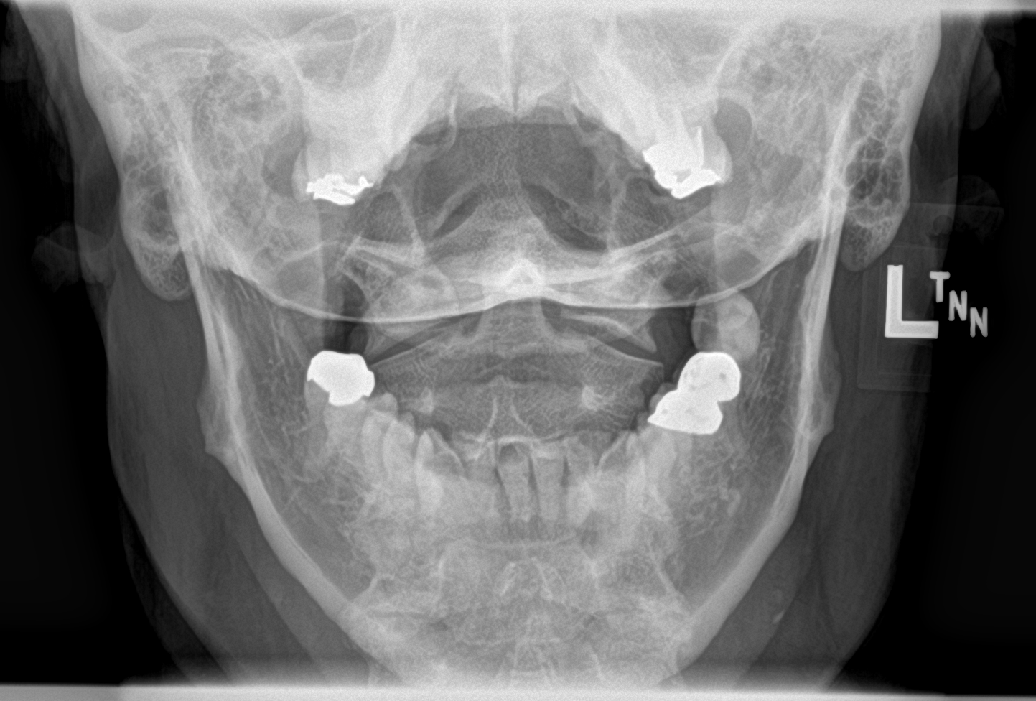
[im 6/6]
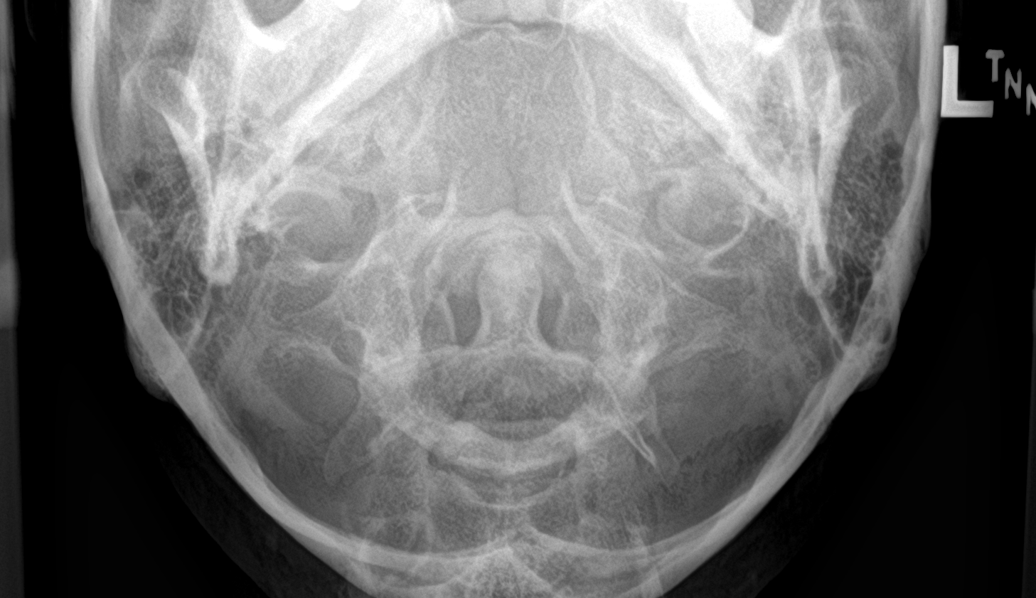

[6 of 6 positions shown; findings below may reference images not displayed]

FINDINGS: Diffuse osteopenia and degenerative change. No acute bony
abnormality. No focal abnormality identified. Bilateral carotid
atherosclerotic vascular calcification.
IMPRESSION: 1. Diffuse osteopenia and degenerative change. No acute or focal
abnormality identified.

2.  Bilateral carotid atherosclerotic vascular disease.

## 2019-08-10 NOTE — Progress Notes (Signed)
Patient was pre-assessed for MD visit. 

## 2019-08-11 ENCOUNTER — Encounter: Payer: Self-pay | Admitting: Oncology

## 2019-08-11 ENCOUNTER — Encounter: Payer: Self-pay | Admitting: Family Medicine

## 2019-08-11 NOTE — Progress Notes (Signed)
Hematology/Oncology  Follow up note [facility] [phone] [phone]   Patient Care Team: Luis Ghent, MD as PCP - General (Family Medicine)  REFERRING PROVIDER: Napoleon Burke REASON FOR VISIT Follow up for multiple myeloma.  Groin pain.  HISTORY OF PRESENTING ILLNESS:  [name] is a  63 y.o.  male with PMH listed below who was referred to me for evaluation of anemia and thrombocytopenia Associated signs and symptoms: Patient reports feeling fatigue.  Denies SOB with exertion.  Denies weight loss, easy bruising, hematochezia, hemoptysis, hematuria. Context: History of GI bleeding: Denies               History of Chronic kidney disease yes               History of autoimmune disease denies denies               History of hemolytic anemia.                Last colonoscopy: 07/20/2018 upper and lower endoscopy showed esophagitis, normal examined duodenum, 2 polyps removed in the ascending colon, resected and retrieved.  Otherwise normal. He drinks alcohol, usually a few beers during the weekends.  Denies any smoking history.   Intended weight loss 3 pounds for the past 3 months.  # 07/27/2018 multiple myeloma panel showed M protein of 0.1, IgG 662, IgA 49, IgM 7. Patient was called back to further labs done. 08/10/2018, free light chain ratio showed extremely high level of kappa free light chain 10,183, with a kappa lambda light chain ratio of 1414.31 LDH 164 Beta-2 microglobulin 5 Patient was called and discuss about results.  He was recommended to undergo bone marrow biopsy and PET scan. 08/19/2018 bone marrow biopsy showed hypercellular marrow 80%, involved by plasma cell neoplasm up to 95%.  Consistent with plasma cell myeloma.  FISH and cytogenetics are pending.  09/10/2018 skeletal survey showed questionable small lucent lesions within the midshaft of the humerus bilaterally.  Otherwise no suspicious focal lytic lesion or acute bone  abnormality. 08/25/2018 PET scan showed no definite hypermetabolic bone disease but CT findings are highly suspicious for numerous small myelomatous lesions involving the spine, sternum and scattered ribs.  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 He received preparative regimen with melphalan 200 mg/m on 04/22/2019 followed by autologous stem cell infusion on 04/23/2019. Currently he is transfusion independent.  Transfusion criteria with as needed hemoglobin less than 7.5 for platelet less than 10,000.  Transplant course was complicated with febrile neutropenia grade 3, treated with vancomycin and cephapirin until engraftment on 05/06/2019. Patient also had engraftment syndrome and had to be started on Solu-Medrol 25 mg twice daily on 05/05/2019.  Transition to Medrol Dosepak on 05/07/2019 to complete steroid taper as outpatient.  Patient is currently on acyclovir for 1 year after transplant, dose was reduced on 722 11/20/2018 due to renal function. Patient had a baseline CKD with creatinine running between 1.4-1.6.  He had acute on chronic kidney injury and creatinine went up to 2.2 on 722.  Fluid hydration was given and creatinine decreased to baseline 1.5-1.7. His Hickman catheter was removed.  Candida Cruris treated with topical clotrimazole 1% 3 times daily and to resolution..  #  seen by Dr. Tonia Burke BMT team on 06/30/2019.revaccination at [facility] begins at 12 months post transplant I discussed with [facility] hematology Luis Burke and he recommends plans as listed below.  -patient to be started on Ixazomib  maintenance: -Ixazomib '3mg'$  on D1/D8/D15 out of 28 day cycle.  -patient will start re-vaccination 12 months post transplant.  -Post transplant bone marrow biopsy if clinically indicated.   INTERVAL HISTORY Luis Burke is a 63 y.o. male who has above history reviewed by me today for follow-up for multiple myeloma. Since last visit, patient reports doing well.  Denies any  fever, chills, shortness of breath, abdominal pain.  Appetite is fair.  Bilateral lower extremity leg swelling has improved. He complains neck pain, 4 out of 10, no worsening or alleviating factors.  No trauma.  Review of Systems  Constitutional: Negative for appetite change, chills, fatigue, fever and unexpected weight change.  HENT:   Negative for hearing loss and voice change.   Eyes: Negative for eye problems and icterus.  Respiratory: Negative for chest tightness, cough and shortness of breath.   Cardiovascular: Negative for chest pain and leg swelling.  Gastrointestinal: Negative for abdominal distention and abdominal pain.  Endocrine: Negative for hot flashes.  Genitourinary: Negative for difficulty urinating, dysuria and frequency.   Musculoskeletal: Negative for arthralgias.  Skin: Negative for itching and rash.  Neurological: Negative for light-headedness and numbness.  Hematological: Negative for adenopathy. Does not bruise/bleed easily.  Psychiatric/Behavioral: Negative for confusion.    MEDICAL HISTORY:  Past Medical History:  Diagnosis Date  . AKI (acute kidney injury) ([place]) 01/06/2019  . Anemia   . 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[place])    autologous stem cell bone marrow transplant on 04/23/2019.  Marland Kitchen Fatty liver    on u/s 07/2018  . Hyperlipidemia 03/2004  . Hypertension 1994  . Snores     SURGICAL HISTORY: Past Surgical History:  Procedure Laterality Date  . CATARACT EXTRACTION W/PHACO Right 03/04/2018   Procedure: CATARACT EXTRACTION PHACO AND INTRAOCULAR LENS PLACEMENT ([place])  RIGHT TORIC;  Surgeon: Luis Koyanagi, MD;  Location: [facility];  Service: Ophthalmology;  Laterality: Right;  [facility] no Toric Lens 1:45 5.3  . COLONOSCOPY WITH PROPOFOL N/A 07/20/2018   Procedure: COLONOSCOPY WITH PROPOFOL;  Surgeon: Luis Bellows, MD;  Location: [facility] ENDOSCOPY;  Service: Gastroenterology;  Laterality: N/A;  . ESOPHAGOGASTRODUODENOSCOPY (EGD) WITH PROPOFOL N/A  07/20/2018   Procedure: ESOPHAGOGASTRODUODENOSCOPY (EGD) WITH PROPOFOL;  Surgeon: Luis Bellows, MD;  Location: [facility] ENDOSCOPY;  Service: Gastroenterology;  Laterality: N/A;  . EYE SURGERY Right   . HERNIA REPAIR    . L Lap herniorraphy  04/2000  . Left wrist ganglionectomy      SOCIAL HISTORY: Social History   Socioeconomic History  . Marital status: Single    Spouse name: Not on file  . Number of children: 1  . Years of education: Not on file  . Highest education level: Not on file  Occupational History  . Occupation: [employer]  Social Needs  . Financial resource strain: Not on file  . Food insecurity    Worry: Not on file    Inability: Not on file  . Transportation needs    Medical: Not on file    Non-medical: Not on file  Tobacco Use  . Smoking status: Never Smoker  . Smokeless tobacco: Never Used  . Tobacco comment: Occassionally  Substance and Sexual Activity  . Alcohol use: Not Currently    Alcohol/week: 3.0 standard drinks    Types: 3 Cans of beer per week  . Drug use: No  . Sexual activity: Not on file  Lifestyle  . Physical activity    Days per week: Not on file  Minutes per session: Not on file  . Stress: Not on file  Relationships  . Social Herbalist on phone: Not on file    Gets together: Not on file    Attends religious service: Not on file    Active member of club or organization: Not on file    Attends meetings of clubs or organizations: Not on file    Relationship status: Not on file  . Intimate partner violence    Fear of current or ex partner: Not on file    Emotionally abused: Not on file    Physically abused: Not on file    Forced sexual activity: Not on file  Other Topics Concern  . Not on file  Social History Narrative   Divorced 2007-12-24, dating as of Dec 23, 2017   His daughter died 4 days after giving birth to the patient's granddaughter   Has joint custody of his dead daughter's child   Works at [employer] is [employer]     FAMILY HISTORY: Family History  Problem Relation Age of Onset  . Hypertension Mother   . Diabetes Mother   . Heart disease Mother        CAD  . Dementia Mother   . Hypertension Father   . Heart disease Father        MI 02/03  . Colon cancer Father   . Hypertension Sister   . Hypertension Sister   . Hypertension Sister   . Prostate cancer Neg Hx     ALLERGIES:  is allergic to bactrim [sulfamethoxazole-trimethoprim]; clonidine derivatives; and tadalafil.  MEDICATIONS:  Current Outpatient Medications  Medication Sig Dispense Refill  . acetaminophen (TYLENOL) 325 MG tablet Take 650 mg by mouth every 6 (six) hours as needed.    Marland Kitchen acyclovir (ZOVIRAX) 400 MG tablet Take 1 tablet (400 mg total) by mouth 2 (two) times daily. 60 tablet 11  . amLODipine (NORVASC) 5 MG tablet Take 2 tablets (10 mg total) by mouth daily.    Marland Kitchen co-enzyme Q-10 50 MG capsule Take 50 mg by mouth daily.    . [facility] CLOTRIMAZOLE 1 % cream APPLY TO AFFECTED AREA 3 TIMES A DAY FOR 10 DAYS    . cyanocobalamin ([employer]S VITAMIN B12) 1000 MCG tablet Take 1 tablet (1,000 mcg total) by mouth daily. 90 tablet 3  . cyclobenzaprine (FLEXERIL) 5 MG tablet Take 1 tablet (5 mg total) by mouth 3 (three) times daily as needed for muscle spasms. 30 tablet 0  . famotidine (PEPCID) 20 MG tablet Take 20 mg by mouth 2 (two) times daily.    . hydrALAZINE (APRESOLINE) 10 MG tablet Take 1 tablet (10 mg total) by mouth 3 (three) times daily. 270 tablet 3  . ixazomib citrate (NINLARO) 3 MG capsule Take 1 capsule (3 mg) by mouth weekly, 3 weeks on, 1 week off, repeat every 4 weeks. Take on an empty stomach 1hr before or 2hr after meals. 3 capsule 3  . lisinopril (ZESTRIL) 20 MG tablet Take 1 tablet (20 mg total) by mouth daily. 90 tablet 3  . LORazepam (ATIVAN) 1 MG tablet TAKE 1 TABLET (1 MG TOTAL) BY MOUTH EVERY 6 (SIX) HOURS AS NEEDED FOR ANXIETY (FOR NAUSEA)    . magnesium chloride (SLOW-MAG) 64 MG TBEC SR tablet Take 1 tablet (64 mg total) by  mouth daily. 60 tablet 0  . metoprolol succinate (TOPROL-XL) 50 MG 24 hr tablet Take 4 tablets (200 mg total) by mouth daily.    Marland Kitchen  ondansetron (ZOFRAN) 8 MG tablet Take 8 mg by mouth every 8 (eight) hours as needed. for nausea    . prochlorperazine (COMPAZINE) 10 MG tablet TAKE 1 TABLET (10 MG TOTAL) BY MOUTH EVERY 6 (SIX) HOURS AS NEEDED FOR NAUSEA    . zinc gluconate 50 MG tablet Take 50 mg by mouth daily.     No current facility-administered medications for this visit.      PHYSICAL EXAMINATION: ECOG PERFORMANCE STATUS: 1 - Symptomatic but completely ambulatory Vitals:   08/10/19 0918  BP: 138/86  Pulse: 81  Resp: 18  Temp: 97.7 F (36.5 C)   Filed Weights   08/10/19 0918  Weight: 200 lb 11.2 oz (91 kg)    Physical Exam Constitutional:      General: He is not in acute distress. HENT:     Head: Normocephalic and atraumatic.  Eyes:     General: No scleral icterus.    Pupils: Pupils are equal, round, and reactive to light.  Neck:     Musculoskeletal: Normal range of motion and neck supple.  Cardiovascular:     Rate and Rhythm: Normal rate and regular rhythm.     Heart sounds: Normal heart sounds.  Pulmonary:     Effort: Pulmonary effort is normal. No respiratory distress.     Breath sounds: No wheezing.  Abdominal:     General: Bowel sounds are normal. There is no distension.     Palpations: Abdomen is soft. There is no mass.     Tenderness: There is no abdominal tenderness.  Musculoskeletal: Normal range of motion.        General: No swelling or deformity.  Skin:    General: Skin is warm and dry.     Findings: No erythema or rash.  Neurological:     Mental Status: He is alert and oriented to person, place, and time.     Cranial Nerves: No cranial nerve deficit.     Coordination: Coordination normal.  Psychiatric:        Behavior: Behavior normal.        Thought Content: Thought content normal.      LABORATORY DATA:  I have reviewed the data as listed  Lab Results  Component Value Date   WBC 5.9 08/10/2019   HGB 11.6 (L) 08/10/2019   HCT 33.4 (L) 08/10/2019   MCV 95.2 08/10/2019   PLT 95 (L) 08/10/2019   Recent Labs    06/25/19 0810 07/13/19 0833 08/10/19 0846  NA 142 140 140  K 3.5 3.7 3.6  CL 107 107 108  CO2 '24 25 23  '$ GLUCOSE 169* 108* 112*  BUN '15 17 21  '$ CREATININE 1.37* 1.34* 1.40*  CALCIUM 9.3 9.6 9.5  GFRNONAA 55* 56* 53*  GFRAA >60 >60 >60  PROT 6.7 7.0 6.9  ALBUMIN 4.5 4.4 4.6  AST '23 20 22  '$ ALT '15 21 19  '$ ALKPHOS 58 63 75  BILITOT 0.7 0.7 0.7   Iron/TIBC/Ferritin/ %Sat    Component Value Date/Time   IRON 74 07/01/2018 0944   TIBC 250 07/01/2018 0944   FERRITIN 357 07/01/2018 0944   IRONPCTSAT 30 07/01/2018 0944    Lab Results  Component Value Date   TOTALPROTELP 6.6 03/10/2019   ALBUMINELP 3.7 03/10/2019   A1GS 0.3 03/10/2019   A2GS 0.8 03/10/2019   BETS 1.3 03/10/2019   GAMS 0.6 03/10/2019   MSPIKE Not Observed 03/10/2019   SPEI Comment 03/10/2019   Lab Results  Component Value Date  KPAFRELGTCHN 64.1 (H) 03/10/2019   LAMBDASER 14.2 03/10/2019   KAPLAMBRATIO 4.51 (H) 03/10/2019    RADIOGRAPHIC STUDIES: I have personally reviewed the radiological images as listed and agreed with the findings in the report. 10/23/2018 FINDINGS: The heart size and mediastinal contours are within normal limits. Both lungs are clear. The visualized skeletal structures are unremarkable. IMPRESSION: No active cardiopulmonary disease.   ASSESSMENT & PLAN:  1. Multiple myeloma in remission ([place])   2. 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 Thrombocytopenia ([facility])   4. Stage 3a chronic kidney disease   5. Anemia, unspecified type   6. Encounter for antineoplastic chemotherapy   7. Neck pain    #Light chain multiple myeloma beta 2 microglobulin 5 and normal albumin.  Stage II. 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 S/p RVD x 9 Autologous bone marrow stem cell  transplant at [facility] on 04/23/2019. Today's day +110 Labs reviewed and discussed with patient.  # Thrombocytopenia, platelet continues to improve, today 95,000. # Anemia, improved.  Hemoglobin 11.6. # HTN, is well controlled.  138/86 in the clinic today.  On hydralazine, amlodipine, toprol XL.  # Post bone marrow transplant care.  Continue acyclovir '400mg'$  BID.  Recommend patient to start Ixazomib '3mg'$  on D1/D8/D15 out of 28 day cycle.  Rationale and potential side effects were discussed with patient.  #Bone health, recommend patient to start bisphosphonate. Discussed the potential side effects of bisphosphonate including but not limited to- hypocalcemia and ONJ; discussed not to have invasive dental procedures few weeks around the time of the infusions.Recommend patient to obtain dental clearance. #CKD, creatinine remains stable, Cr 1.40, fairly stable.  Recommend. avoidingnephrotoxins.  #Hypomagnesia, continue oral slow Mag '64mg'$  daily, Mag is 1.8, no need for magnesium. #Neck pain, recommend obtaining cervical spine x-ray.  Follow up in 2 weeks.   Earlie Server, MD, PhD Hematology Oncology [place] at [facility] 08/11/2019

## 2019-08-16 ENCOUNTER — Telehealth: Payer: Self-pay

## 2019-08-16 ENCOUNTER — Encounter: Payer: Self-pay | Admitting: *Deleted

## 2019-08-16 NOTE — Telephone Encounter (Signed)
Erroneous Encounter

## 2019-08-16 NOTE — Telephone Encounter (Signed)
Pt called back to discuss Nephrology referral.  Pt states Dr. Tasia Catchings said his labs don't warrant him seeing a nephrologist just yet.  He will be followed by Dr. Tasia Catchings every month and Dr. Maylene Roes, [facility] every 3 months.  Ok to close referral?

## 2019-08-16 NOTE — Telephone Encounter (Signed)
Agreed, thanks, please close this out.

## 2019-08-16 NOTE — Patient Instructions (Signed)
[employer] forms have been completed and scanned to clinic for MD signature.

## 2019-08-16 NOTE — Progress Notes (Signed)
[employer] disability forms printed along with last MD note and recent med list.  Will give to MD to sign and fax with cover sheet to [phone]

## 2019-08-24 ENCOUNTER — Encounter: Payer: Self-pay | Admitting: Oncology

## 2019-08-24 ENCOUNTER — Inpatient Hospital Stay (HOSPITAL_BASED_OUTPATIENT_CLINIC_OR_DEPARTMENT_OTHER): Payer: BC Managed Care – PPO | Admitting: Oncology

## 2019-08-24 ENCOUNTER — Inpatient Hospital Stay: Payer: BC Managed Care – PPO | Attending: Oncology

## 2019-08-24 ENCOUNTER — Other Ambulatory Visit: Payer: Self-pay

## 2019-08-24 ENCOUNTER — Telehealth: Payer: Self-pay

## 2019-08-24 VITALS — BP 151/92 | HR 67 | Temp 97.4°F | Resp 16 | Wt 203.4 lb

## 2019-08-24 DIAGNOSIS — Z79899 Other long term (current) drug therapy: Secondary | ICD-10-CM | POA: Insufficient documentation

## 2019-08-24 DIAGNOSIS — C9000 Multiple myeloma not having achieved remission: Secondary | ICD-10-CM

## 2019-08-24 DIAGNOSIS — Z5111 Encounter for antineoplastic chemotherapy: Secondary | ICD-10-CM | POA: Diagnosis not present

## 2019-08-24 DIAGNOSIS — I10 Essential (primary) hypertension: Secondary | ICD-10-CM | POA: Diagnosis not present

## 2019-08-24 DIAGNOSIS — R1030 Lower abdominal pain, unspecified: Secondary | ICD-10-CM | POA: Insufficient documentation

## 2019-08-24 DIAGNOSIS — D696 Thrombocytopenia, unspecified: Secondary | ICD-10-CM

## 2019-08-24 DIAGNOSIS — D649 Anemia, unspecified: Secondary | ICD-10-CM | POA: Insufficient documentation

## 2019-08-24 DIAGNOSIS — Z9481 Bone marrow transplant status: Secondary | ICD-10-CM | POA: Diagnosis not present

## 2019-08-24 DIAGNOSIS — M8580 Other specified disorders of bone density and structure, unspecified site: Secondary | ICD-10-CM

## 2019-08-24 DIAGNOSIS — Z8249 Family history of ischemic heart disease and other diseases of the circulatory system: Secondary | ICD-10-CM | POA: Diagnosis not present

## 2019-08-24 DIAGNOSIS — N1831 Chronic kidney disease, stage 3a: Secondary | ICD-10-CM

## 2019-08-24 DIAGNOSIS — Z800 Family history of malignant neoplasm of digestive organs: Secondary | ICD-10-CM | POA: Diagnosis not present

## 2019-08-24 LAB — COMPREHENSIVE METABOLIC PANEL
ALT: 21 U/L (ref 0–44)
AST: 24 U/L (ref 15–41)
Albumin: 4.5 g/dL (ref 3.5–5.0)
Alkaline Phosphatase: 62 U/L (ref 38–126)
Anion gap: 5 (ref 5–15)
BUN: 25 mg/dL — ABNORMAL HIGH (ref 8–23)
CO2: 26 mmol/L (ref 22–32)
Calcium: 9.5 mg/dL (ref 8.9–10.3)
Chloride: 109 mmol/L (ref 98–111)
Creatinine, Ser: 1.44 mg/dL — ABNORMAL HIGH (ref 0.61–1.24)
GFR calc Af Amer: 59 mL/min — ABNORMAL LOW (ref >60)
GFR calc non Af Amer: 51 mL/min — ABNORMAL LOW (ref >60)
Glucose, Bld: 108 mg/dL — ABNORMAL HIGH (ref 70–99)
Potassium: 4.5 mmol/L (ref 3.5–5.1)
Sodium: 140 mmol/L (ref 135–145)
Total Bilirubin: 0.7 mg/dL (ref 0.3–1.2)
Total Protein: 7.5 g/dL (ref 6.5–8.1)

## 2019-08-24 LAB — CBC WITH DIFFERENTIAL/PLATELET
Abs Immature Granulocytes: 0.03 10*3/uL (ref 0.00–0.07)
Basophils Absolute: 0 10*3/uL (ref 0.0–0.1)
Basophils Relative: 0 %
Eosinophils Absolute: 0.1 10*3/uL (ref 0.0–0.5)
Eosinophils Relative: 3 %
HCT: 32.6 % — ABNORMAL LOW (ref 39.0–52.0)
Hemoglobin: 11.1 g/dL — ABNORMAL LOW (ref 13.0–17.0)
Immature Granulocytes: 1 %
Lymphocytes Relative: 29 %
Lymphs Abs: 1.7 10*3/uL (ref 0.7–4.0)
MCH: 33.2 pg (ref 26.0–34.0)
MCHC: 34 g/dL (ref 30.0–36.0)
MCV: 97.6 fL (ref 80.0–100.0)
Monocytes Absolute: 0.6 10*3/uL (ref 0.1–1.0)
Monocytes Relative: 11 %
Neutro Abs: 3.2 10*3/uL (ref 1.7–7.7)
Neutrophils Relative %: 56 %
Platelets: 89 10*3/uL — ABNORMAL LOW (ref 150–400)
RBC: 3.34 MIL/uL — ABNORMAL LOW (ref 4.22–5.81)
RDW: 13.3 % (ref 11.5–15.5)
WBC: 5.7 10*3/uL (ref 4.0–10.5)
nRBC: 0 % (ref 0.0–0.2)

## 2019-08-24 LAB — MAGNESIUM: Magnesium: 2 mg/dL (ref 1.7–2.4)

## 2019-08-24 NOTE — Progress Notes (Signed)
Patient does not offer any problems today.  

## 2019-08-24 NOTE — Telephone Encounter (Signed)
Called Dr. Quentin Mulling office regarding dental clearance for zometa. Spoke to [employer] and she said that Dr. Norman Herrlich was not in today but he will be there tomorrow and she will relay message and fax letter to [place] once obtained.

## 2019-08-24 NOTE — Progress Notes (Signed)
Hematology/Oncology  Follow up note [facility] [phone] [phone]   Patient Care Team: Tonia Ghent, MD as PCP - General (Family Medicine)  REFERRING PROVIDER: Napoleon Form REASON FOR VISIT Follow up for multiple myeloma.  Groin pain.  HISTORY OF PRESENTING ILLNESS:  Luis Burke is a  63 y.o.  male with PMH listed below who was referred to me for evaluation of anemia and thrombocytopenia Associated signs and symptoms: Patient reports feeling fatigue.  Denies SOB with exertion.  Denies weight loss, easy bruising, hematochezia, hemoptysis, hematuria. Context: History of GI bleeding: Denies               History of Chronic kidney disease yes               History of autoimmune disease denies denies               History of hemolytic anemia.                Last colonoscopy: 07/20/2018 upper and lower endoscopy showed esophagitis, normal examined duodenum, 2 polyps removed in the ascending colon, resected and retrieved.  Otherwise normal. He drinks alcohol, usually a few beers during the weekends.  Denies any smoking history.   Intended weight loss 3 pounds for the past 3 months.  # 07/27/2018 multiple myeloma panel showed M protein of 0.1, IgG 662, IgA 49, IgM 7. Patient was called back to further labs done. 08/10/2018, free light chain ratio showed extremely high level of kappa free light chain 10,183, with a kappa lambda light chain ratio of 1414.31 LDH 164 Beta-2 microglobulin 5 Patient was called and discuss about results.  He was recommended to undergo bone marrow biopsy and PET scan. 08/19/2018 bone marrow biopsy showed hypercellular marrow 80%, involved by plasma cell neoplasm up to 95%.  Consistent with plasma cell myeloma.  FISH and cytogenetics are pending.  09/10/2018 skeletal survey showed questionable small lucent lesions within the midshaft of the humerus bilaterally.  Otherwise no suspicious focal lytic lesion or acute bone  abnormality. 08/25/2018 PET scan showed no definite hypermetabolic bone disease but CT findings are highly suspicious for numerous small myelomatous lesions involving the spine, sternum and scattered ribs.  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 He received preparative regimen with melphalan 200 mg/m on 04/22/2019 followed by autologous stem cell infusion on 04/23/2019. Currently he is transfusion independent.  Transfusion criteria with as needed hemoglobin less than 7.5 for platelet less than 10,000.  Transplant course was complicated with febrile neutropenia grade 3, treated with vancomycin and cephapirin until engraftment on 05/06/2019. Patient also had engraftment syndrome and had to be started on Solu-Medrol 25 mg twice daily on 05/05/2019.  Transition to Medrol Dosepak on 05/07/2019 to complete steroid taper as outpatient.  Patient is currently on acyclovir for 1 year after transplant, dose was reduced on 722 11/20/2018 due to renal function. Patient had a baseline CKD with creatinine running between 1.4-1.6.  He had acute on chronic kidney injury and creatinine went up to 2.2 on 722.  Fluid hydration was given and creatinine decreased to baseline 1.5-1.7. His Hickman catheter was removed.  Candida Cruris treated with topical clotrimazole 1% 3 times daily and to resolution..  #  seen by Dr. Tonia Brooms BMT team on 06/30/2019.revaccination at [facility] begins at 12 months post transplant I discussed with [facility] hematology Dr.Choi and he recommends plans as listed below.  -patient to be started on Ixazomib  maintenance: -Ixazomib 99m on D1/D8/D15 out of 28 day cycle.  -patient will start re-vaccination 12 months post transplant.  -Post transplant bone marrow biopsy if clinically indicated.   INTERVAL HISTORY Luis KONis a 63y.o. 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 # [facility]  # Started on Ixazomib 336mon D1/D8/D15 out of 28 days cycles. He reports tolerating well.  # Neck pain has improved. Had cervical spine Xray which showed osteopenia, DJD. Incidental finding of carotid artery atherosclerosis.  Neck pain has eased off.  He reports that he has obtain dental clearance.  .  Review of Systems  Constitutional: Negative for appetite change, chills, fatigue, fever and unexpected weight change.  HENT:   Negative for hearing loss and voice change.   Eyes: Negative for eye problems and icterus.  Respiratory: Negative for chest tightness, cough and shortness of breath.   Cardiovascular: Negative for chest pain and leg swelling.  Gastrointestinal: Negative for abdominal distention and abdominal pain.  Endocrine: Negative for hot flashes.  Genitourinary: Negative for difficulty urinating, dysuria and frequency.   Musculoskeletal: Negative for arthralgias.  Skin: Negative for itching and rash.  Neurological: Negative for light-headedness and numbness.  Hematological: Negative for adenopathy. Does not bruise/bleed easily.  Psychiatric/Behavioral: Negative for confusion.    MEDICAL HISTORY:  Past Medical History:  Diagnosis Date  . AKI (acute kidney injury) ([facility]  . Anemia   . 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   autologous stem cell bone marrow transplant on 04/23/2019.  . Mar[place] Kitchenatty liver    on u/s 07/2018  . Hyperlipidemia 03/2004  . Hypertension 1994  . Snores     SURGICAL HISTORY: Past Surgical History:  Procedure Laterality Date  . CATARACT EXTRACTION W/PHACO Right 03/04/2018   Procedure: CATARACT EXTRACTION PHACO AND INTRAOCULAR LENS PLACEMENT ([place];  Surgeon: BrLeandrew KoyanagiMD;  [place] [facility] Service: Ophthalmology;  Laterality: Right;  [facility] no Toric Lens 1:45 5.3  . COLONOSCOPY WITH PROPOFOL N/A 07/20/2018   Procedure: COLONOSCOPY WITH PROPOFOL;  Surgeon: AnJonathon BellowsMD;  [facility];  Service: Gastroenterology;  Laterality: N/A;  . ESOPHAGOGASTRODUODENOSCOPY (EGD) WITH PROPOFOL N/A 07/20/2018   Procedure: ESOPHAGOGASTRODUODENOSCOPY (EGD) WITH PROPOFOL;  Surgeon: AnJonathon BellowsMD;  [facility];  Service: Gastroenterology;  Laterality: N/A;  . EYE SURGERY Right   . HERNIA REPAIR    . L Lap herniorraphy  04/2000  . Left wrist ganglionectomy      SOCIAL HISTORY: Social History   Socioeconomic History  . Marital status: Single    Spouse name: Not on file  . Number of children: 1  . Years of education: Not on file  . Highest education level: Not on file  Occupational History  . Occupation: [employer]  Social Needs  . Financial resource strain: Not on file  . Food insecurity    Worry: Not on file    Inability: Not on file  . Transportation needs    Medical: Not on file    Non-medical: Not on file  Tobacco Use  . Smoking status: Never Smoker  . Smokeless tobacco: Never Used  . Tobacco comment: Occassionally  Substance and Sexual Activity  . Alcohol use: Not Currently    Alcohol/week: 3.0 standard drinks    Types: 3 Cans of beer per week  . Drug use: No  . Sexual  activity: Not on file  Lifestyle  . Physical activity    Days per week: Not on file    Minutes per session: Not on file  . Stress: Not on file  Relationships  . Social Herbalist on phone: Not on file    Gets together: Not on file    Attends religious service: Not on file    Active member of club or organization: Not on file    Attends meetings of clubs or organizations: Not on file    Relationship status: Not on file  . Intimate partner violence    Fear of current or ex partner: Not on file    Emotionally abused: Not on file    Physically abused: Not on file    Forced sexual activity: Not on file  Other Topics Concern  . Not on file  Social History Narrative   Divorced 24-Dec-2007, dating as of 2017-12-23   His daughter died 4 days after giving birth to the  patient's granddaughter   Has joint custody of his dead daughter's child   Works at [employer] is [employer]    FAMILY HISTORY: Family History  Problem Relation Age of Onset  . Hypertension Mother   . Diabetes Mother   . Heart disease Mother        CAD  . Dementia Mother   . Hypertension Father   . Heart disease Father        MI 02/03  . Colon cancer Father   . Hypertension Sister   . Hypertension Sister   . Hypertension Sister   . Prostate cancer Neg Hx     ALLERGIES:  is allergic to bactrim [sulfamethoxazole-trimethoprim]; clonidine derivatives; and tadalafil.  MEDICATIONS:  Current Outpatient Medications  Medication Sig Dispense Refill  . acetaminophen (TYLENOL) 325 MG tablet Take 650 mg by mouth every 6 (six) hours as needed.    Marland Kitchen acyclovir (ZOVIRAX) 400 MG tablet Take 1 tablet (400 mg total) by mouth 2 (two) times daily. 60 tablet 11  . amLODipine (NORVASC) 5 MG tablet Take 2 tablets (10 mg total) by mouth daily.    Marland Kitchen co-enzyme Q-10 50 MG capsule Take 50 mg by mouth daily.    . [facility] CLOTRIMAZOLE 1 % cream APPLY TO AFFECTED AREA 3 TIMES A DAY FOR 10 DAYS    . cyanocobalamin ([facility]S VITAMIN B12) 1000 MCG tablet Take 1 tablet (1,000 mcg total) by mouth daily. 90 tablet 3  . cyclobenzaprine (FLEXERIL) 5 MG tablet Take 1 tablet (5 mg total) by mouth 3 (three) times daily as needed for muscle spasms. 30 tablet 0  . hydrALAZINE (APRESOLINE) 10 MG tablet Take 1 tablet (10 mg total) by mouth 3 (three) times daily. 270 tablet 3  . ixazomib citrate (NINLARO) 3 MG capsule Take 1 capsule (3 mg) by mouth weekly, 3 weeks on, 1 week off, repeat every 4 weeks. Take on an empty stomach 1hr before or 2hr after meals. 3 capsule 3  . lisinopril (ZESTRIL) 20 MG tablet Take 1 tablet (20 mg total) by mouth daily. 90 tablet 3  . LORazepam (ATIVAN) 1 MG tablet TAKE 1 TABLET (1 MG TOTAL) BY MOUTH EVERY 6 (SIX) HOURS AS NEEDED FOR ANXIETY (FOR NAUSEA)    . magnesium chloride (SLOW-MAG) 64 MG TBEC SR  tablet Take 1 tablet (64 mg total) by mouth daily. 60 tablet 0  . metoprolol succinate (TOPROL-XL) 50 MG 24 hr tablet Take 4 tablets (200 mg total) by mouth daily.    Marland Kitchen  ondansetron (ZOFRAN) 8 MG tablet Take 8 mg by mouth every 8 (eight) hours as needed. for nausea    . prochlorperazine (COMPAZINE) 10 MG tablet TAKE 1 TABLET (10 MG TOTAL) BY MOUTH EVERY 6 (SIX) HOURS AS NEEDED FOR NAUSEA    . zinc gluconate 50 MG tablet Take 50 mg by mouth daily.    . famotidine (PEPCID) 20 MG tablet Take 20 mg by mouth 2 (two) times daily.     No current facility-administered medications for this visit.      PHYSICAL EXAMINATION: ECOG PERFORMANCE STATUS: 1 - Symptomatic but completely ambulatory Vitals:   08/24/19 1324  BP: (!) 151/92  Pulse: 67  Resp: 16  Temp: (!) 97.4 F (36.3 C)  SpO2: 100%   Filed Weights   08/24/19 1324  Weight: 203 lb 6.4 oz (92.3 kg)    Physical Exam Constitutional:      General: He is not in acute distress. HENT:     Head: Normocephalic and atraumatic.  Eyes:     General: No scleral icterus.    Pupils: Pupils are equal, round, and reactive to light.  Neck:     Musculoskeletal: Normal range of motion and neck supple.  Cardiovascular:     Rate and Rhythm: Normal rate and regular rhythm.     Heart sounds: Normal heart sounds.  Pulmonary:     Effort: Pulmonary effort is normal. No respiratory distress.     Breath sounds: No wheezing.  Abdominal:     General: Bowel sounds are normal. There is no distension.     Palpations: Abdomen is soft. There is no mass.     Tenderness: There is no abdominal tenderness.  Musculoskeletal: Normal range of motion.        General: No swelling or deformity.  Skin:    General: Skin is warm and dry.     Findings: No erythema or rash.  Neurological:     Mental Status: He is alert and oriented to person, place, and time.     Cranial Nerves: No cranial nerve deficit.     Coordination: Coordination normal.  Psychiatric:         Behavior: Behavior normal.        Thought Content: Thought content normal.      LABORATORY DATA:  I have reviewed the data as listed Lab Results  Component Value Date   WBC 5.7 08/24/2019   HGB 11.1 (L) 08/24/2019   HCT 32.6 (L) 08/24/2019   MCV 97.6 08/24/2019   PLT 89 (L) 08/24/2019   Recent Labs    07/13/19 0833 08/10/19 0846 08/24/19 1311  NA 140 140 140  K 3.7 3.6 4.5  CL 107 108 109  CO2 _0 GLUCOSE 108* 112* 108*  BUN 17 21 25*  CREATININE 1.34* 1.40* 1.44*  CALCIUM 9.6 9.5 9.5  GFRNONAA 56* 53* 51*  GFRAA >60 >60 59*  PROT 7.0 6.9 7.5  ALBUMIN 4.4 4.6 4.5  AST _1 ALT _2 ALKPHOS 63 75 62  BILITOT 0.7 0.7 0.7   Iron/TIBC/Ferritin/ %Sat    Component Value Date/Time   IRON 74 07/01/2018 0944   TIBC 250 07/01/2018 0944   FERRITIN 357 07/01/2018 0944   IRONPCTSAT 30 07/01/2018 0944    Lab Results  Component Value Date   TOTALPROTELP 6.6 03/10/2019   ALBUMINELP 3.7 03/10/2019   A1GS 0.3 03/10/2019   A2GS 0.8 03/10/2019   BETS 1.3 03/10/2019   GAMS  0.6 03/10/2019   MSPIKE Not Observed 03/10/2019   SPEI Comment 03/10/2019   Lab Results  Component Value Date   KPAFRELGTCHN 64.1 (H) 03/10/2019   LAMBDASER 14.2 03/10/2019   KAPLAMBRATIO 4.51 (H) 03/10/2019    RADIOGRAPHIC STUDIES: I have personally reviewed the radiological images as listed and agreed with the findings in the report. 10/23/2018 FINDINGS: The heart size and mediastinal contours are within normal limits. Both lungs are clear. The visualized skeletal structures are unremarkable. IMPRESSION: No active cardiopulmonary disease.   ASSESSMENT & PLAN:  1. Multiple myeloma, remission status unspecified ([place])   2. 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 Thrombocytopenia (HCC)   4. Stage 3a chronic kidney disease   5. Encounter for antineoplastic chemotherapy   6. Osteopenia, unspecified location    #Light chain multiple myeloma beta 2 microglobulin 5 and normal albumin.   Stage II. 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 S/p RVD x 9 Autologous bone marrow stem cell transplant at [facility] on 04/23/2019. Started on maintenance Ixazomib, s/p D1, and D8 dose.  Tolerates well.  Continue current regimen.   # Thrombocytopenia, stable. Platelet count is at  #Anemia, hemoglobin stable at 11.1.  Continue to monitor. #Post bone marrow transplant care, Continue acyclovir 400 mg twice daily. Bone health, recommend to start bisphosphonate or Prolia.  Given his chronic kidney disease, recommend Xgeva. .Discussed the potential side effects of bisphosphonate including but not limited to- hypocalcemia and ONJ; discussed not to have invasive dental procedures few weeks around the time of the injections,   Recommend patient to take calcium 1000 mg as well as vitamin D supplementation  #Hypomagnesemia: Continue.  Magnesium has been stable.  Continue monitor. #Osteopenia, start Xgeva.  Calcium and vitamin D  Repeat blood work in 2 weeks + Xgeva.  Follow up in 4 weeks.   Earlie Server, MD, PhD Hematology Oncology [place] at [facility] 08/24/2019

## 2019-08-25 ENCOUNTER — Telehealth: Payer: Self-pay | Admitting: Family Medicine

## 2019-08-25 DIAGNOSIS — I6529 Occlusion and stenosis of unspecified carotid artery: Secondary | ICD-10-CM

## 2019-08-25 NOTE — Telephone Encounter (Signed)
Please call pt. carotid atherosclerotic changes noted on cervical spine x-ray.  Reasonable to check carotid ultrasound and recheck his lipids.  I put in the orders for both.  Needs a fasting lab visit.  Thanks.

## 2019-08-26 NOTE — Telephone Encounter (Signed)
Patient advised.  Lab appt scheduled.  

## 2019-08-27 NOTE — Progress Notes (Signed)
Appointment was cancelled.

## 2019-08-30 ENCOUNTER — Other Ambulatory Visit: Payer: Self-pay | Admitting: Family Medicine

## 2019-08-30 ENCOUNTER — Other Ambulatory Visit (INDEPENDENT_AMBULATORY_CARE_PROVIDER_SITE_OTHER): Payer: BC Managed Care – PPO

## 2019-08-30 ENCOUNTER — Other Ambulatory Visit: Payer: Self-pay

## 2019-08-30 DIAGNOSIS — I6529 Occlusion and stenosis of unspecified carotid artery: Secondary | ICD-10-CM

## 2019-08-30 LAB — LIPID PANEL
Cholesterol: 196 mg/dL (ref 0–200)
HDL: 31.1 mg/dL — ABNORMAL LOW (ref >39.00)
NonHDL: 165.32
Total CHOL/HDL Ratio: 6
Triglycerides: 224 mg/dL — ABNORMAL HIGH (ref 0.0–149.0)
VLDL: 44.8 mg/dL — ABNORMAL HIGH (ref 0.0–40.0)

## 2019-08-30 LAB — LDL CHOLESTEROL, DIRECT: Direct LDL: 125 mg/dL

## 2019-08-30 NOTE — Telephone Encounter (Signed)
Electronic refill request. Clonidine  Medication is not on current meds list Last office visit:   08/03/2019 Last Filled:   #60  3 RF on 06/07/2019   Please advise.

## 2019-08-31 ENCOUNTER — Other Ambulatory Visit: Payer: Self-pay | Admitting: Family Medicine

## 2019-08-31 NOTE — Telephone Encounter (Signed)
I think this was an automatic refill.  Medication has been stopped in the meantime.  Please clarify with patient.  He may do need to talk to pharmacy to take this off automatic refill.

## 2019-08-31 NOTE — Telephone Encounter (Signed)
Patient does not take that medication and will call pharmacy to remove it from auto refill.

## 2019-09-07 ENCOUNTER — Inpatient Hospital Stay: Payer: BC Managed Care – PPO

## 2019-09-07 ENCOUNTER — Other Ambulatory Visit: Payer: Self-pay

## 2019-09-07 DIAGNOSIS — Z800 Family history of malignant neoplasm of digestive organs: Secondary | ICD-10-CM | POA: Diagnosis not present

## 2019-09-07 DIAGNOSIS — D649 Anemia, unspecified: Secondary | ICD-10-CM | POA: Diagnosis not present

## 2019-09-07 DIAGNOSIS — R1030 Lower abdominal pain, unspecified: Secondary | ICD-10-CM | POA: Diagnosis not present

## 2019-09-07 DIAGNOSIS — Z8249 Family history of ischemic heart disease and other diseases of the circulatory system: Secondary | ICD-10-CM | POA: Diagnosis not present

## 2019-09-07 DIAGNOSIS — N179 Acute kidney failure, unspecified: Secondary | ICD-10-CM

## 2019-09-07 DIAGNOSIS — N1831 Chronic kidney disease, stage 3a: Secondary | ICD-10-CM | POA: Diagnosis not present

## 2019-09-07 DIAGNOSIS — M8580 Other specified disorders of bone density and structure, unspecified site: Secondary | ICD-10-CM | POA: Diagnosis not present

## 2019-09-07 DIAGNOSIS — Z79899 Other long term (current) drug therapy: Secondary | ICD-10-CM | POA: Diagnosis not present

## 2019-09-07 DIAGNOSIS — C9000 Multiple myeloma not having achieved remission: Secondary | ICD-10-CM | POA: Diagnosis not present

## 2019-09-07 DIAGNOSIS — Z9481 Bone marrow transplant status: Secondary | ICD-10-CM | POA: Diagnosis not present

## 2019-09-07 DIAGNOSIS — I10 Essential (primary) hypertension: Secondary | ICD-10-CM | POA: Diagnosis not present

## 2019-09-07 LAB — COMPREHENSIVE METABOLIC PANEL
ALT: 22 U/L (ref 0–44)
AST: 22 U/L (ref 15–41)
Albumin: 4.2 g/dL (ref 3.5–5.0)
Alkaline Phosphatase: 72 U/L (ref 38–126)
Anion gap: 4 — ABNORMAL LOW (ref 5–15)
BUN: 21 mg/dL (ref 8–23)
CO2: 26 mmol/L (ref 22–32)
Calcium: 9.1 mg/dL (ref 8.9–10.3)
Chloride: 108 mmol/L (ref 98–111)
Creatinine, Ser: 1.51 mg/dL — ABNORMAL HIGH (ref 0.61–1.24)
GFR calc Af Amer: 56 mL/min — ABNORMAL LOW (ref >60)
GFR calc non Af Amer: 48 mL/min — ABNORMAL LOW (ref >60)
Glucose, Bld: 100 mg/dL — ABNORMAL HIGH (ref 70–99)
Potassium: 4.1 mmol/L (ref 3.5–5.1)
Sodium: 138 mmol/L (ref 135–145)
Total Bilirubin: 0.5 mg/dL (ref 0.3–1.2)
Total Protein: 6.7 g/dL (ref 6.5–8.1)

## 2019-09-07 LAB — CBC WITH DIFFERENTIAL/PLATELET
Abs Immature Granulocytes: 0.02 10*3/uL (ref 0.00–0.07)
Basophils Absolute: 0 10*3/uL (ref 0.0–0.1)
Basophils Relative: 0 %
Eosinophils Absolute: 0.1 10*3/uL (ref 0.0–0.5)
Eosinophils Relative: 1 %
HCT: 33.1 % — ABNORMAL LOW (ref 39.0–52.0)
Hemoglobin: 11.2 g/dL — ABNORMAL LOW (ref 13.0–17.0)
Immature Granulocytes: 0 %
Lymphocytes Relative: 19 %
Lymphs Abs: 1.2 10*3/uL (ref 0.7–4.0)
MCH: 33.6 pg (ref 26.0–34.0)
MCHC: 33.8 g/dL (ref 30.0–36.0)
MCV: 99.4 fL (ref 80.0–100.0)
Monocytes Absolute: 0.5 10*3/uL (ref 0.1–1.0)
Monocytes Relative: 7 %
Neutro Abs: 4.3 10*3/uL (ref 1.7–7.7)
Neutrophils Relative %: 73 %
Platelets: 75 10*3/uL — ABNORMAL LOW (ref 150–400)
RBC: 3.33 MIL/uL — ABNORMAL LOW (ref 4.22–5.81)
RDW: 13.5 % (ref 11.5–15.5)
WBC: 6.1 10*3/uL (ref 4.0–10.5)
nRBC: 0 % (ref 0.0–0.2)

## 2019-09-07 MED ORDER — DENOSUMAB 120 MG/1.7ML ~~LOC~~ SOLN
120.0000 mg | Freq: Once | SUBCUTANEOUS | Status: AC
  Administered 2019-09-07: 120 mg via SUBCUTANEOUS
  Filled 2019-09-07: qty 1.7

## 2019-09-08 LAB — KAPPA/LAMBDA LIGHT CHAINS
Kappa free light chain: 37.1 mg/L — ABNORMAL HIGH (ref 3.3–19.4)
Kappa, lambda light chain ratio: 3.23 — ABNORMAL HIGH (ref 0.26–1.65)
Lambda free light chains: 11.5 mg/L (ref 5.7–26.3)

## 2019-09-09 LAB — MULTIPLE MYELOMA PANEL, SERUM
Albumin SerPl Elph-Mcnc: 4 g/dL (ref 2.9–4.4)
Albumin/Glob SerPl: 1.8 — ABNORMAL HIGH (ref 0.7–1.7)
Alpha 1: 0.2 g/dL (ref 0.0–0.4)
Alpha2 Glob SerPl Elph-Mcnc: 0.6 g/dL (ref 0.4–1.0)
B-Globulin SerPl Elph-Mcnc: 0.9 g/dL (ref 0.7–1.3)
Gamma Glob SerPl Elph-Mcnc: 0.6 g/dL (ref 0.4–1.8)
Globulin, Total: 2.3 g/dL (ref 2.2–3.9)
IgA: 109 mg/dL (ref 61–437)
IgG (Immunoglobin G), Serum: 684 mg/dL (ref 603–1613)
IgM (Immunoglobulin M), Srm: 15 mg/dL — ABNORMAL LOW (ref 20–172)
Total Protein ELP: 6.3 g/dL (ref 6.0–8.5)

## 2019-09-13 ENCOUNTER — Other Ambulatory Visit: Payer: Self-pay

## 2019-09-13 ENCOUNTER — Ambulatory Visit (INDEPENDENT_AMBULATORY_CARE_PROVIDER_SITE_OTHER): Payer: BC Managed Care – PPO

## 2019-09-13 DIAGNOSIS — I6529 Occlusion and stenosis of unspecified carotid artery: Secondary | ICD-10-CM | POA: Diagnosis not present

## 2019-09-14 ENCOUNTER — Encounter: Payer: Self-pay | Admitting: Oncology

## 2019-09-19 ENCOUNTER — Other Ambulatory Visit: Payer: Self-pay | Admitting: Family Medicine

## 2019-09-19 DIAGNOSIS — I6529 Occlusion and stenosis of unspecified carotid artery: Secondary | ICD-10-CM

## 2019-09-19 MED ORDER — PRAVASTATIN SODIUM 10 MG PO TABS: 10.0000 mg | ORAL_TABLET | Freq: Every day | ORAL | 3 refills | Status: DC

## 2019-09-20 ENCOUNTER — Inpatient Hospital Stay: Payer: BC Managed Care – PPO

## 2019-09-21 ENCOUNTER — Ambulatory Visit: Payer: BC Managed Care – PPO | Admitting: Oncology

## 2019-09-21 ENCOUNTER — Other Ambulatory Visit: Payer: BC Managed Care – PPO

## 2019-09-21 ENCOUNTER — Inpatient Hospital Stay: Payer: BC Managed Care – PPO | Admitting: Oncology

## 2019-09-27 ENCOUNTER — Other Ambulatory Visit: Payer: Self-pay | Admitting: Family Medicine

## 2019-09-28 ENCOUNTER — Other Ambulatory Visit: Payer: Self-pay | Admitting: Family Medicine

## 2019-09-29 DIAGNOSIS — C9000 Multiple myeloma not having achieved remission: Secondary | ICD-10-CM | POA: Diagnosis not present

## 2019-10-04 ENCOUNTER — Other Ambulatory Visit: Payer: Self-pay | Admitting: Family Medicine

## 2019-10-11 ENCOUNTER — Encounter: Payer: Self-pay | Admitting: Oncology

## 2019-10-19 ENCOUNTER — Encounter: Payer: Self-pay | Admitting: Oncology

## 2019-10-27 ENCOUNTER — Other Ambulatory Visit: Payer: Self-pay | Admitting: Family Medicine

## 2019-10-28 ENCOUNTER — Encounter: Payer: Self-pay | Admitting: Oncology

## 2019-10-28 ENCOUNTER — Telehealth: Payer: Self-pay | Admitting: Pharmacy Technician

## 2019-10-28 ENCOUNTER — Inpatient Hospital Stay: Payer: BC Managed Care – PPO | Attending: Oncology

## 2019-10-28 ENCOUNTER — Telehealth: Payer: Self-pay

## 2019-10-28 ENCOUNTER — Inpatient Hospital Stay: Payer: BC Managed Care – PPO

## 2019-10-28 ENCOUNTER — Inpatient Hospital Stay (HOSPITAL_BASED_OUTPATIENT_CLINIC_OR_DEPARTMENT_OTHER): Payer: BC Managed Care – PPO | Admitting: Oncology

## 2019-10-28 ENCOUNTER — Other Ambulatory Visit: Payer: Self-pay

## 2019-10-28 DIAGNOSIS — E785 Hyperlipidemia, unspecified: Secondary | ICD-10-CM | POA: Diagnosis not present

## 2019-10-28 DIAGNOSIS — K760 Fatty (change of) liver, not elsewhere classified: Secondary | ICD-10-CM | POA: Insufficient documentation

## 2019-10-28 DIAGNOSIS — I10 Essential (primary) hypertension: Secondary | ICD-10-CM | POA: Insufficient documentation

## 2019-10-28 DIAGNOSIS — D649 Anemia, unspecified: Secondary | ICD-10-CM | POA: Diagnosis not present

## 2019-10-28 DIAGNOSIS — C9000 Multiple myeloma not having achieved remission: Secondary | ICD-10-CM

## 2019-10-28 DIAGNOSIS — Z7902 Long term (current) use of antithrombotics/antiplatelets: Secondary | ICD-10-CM | POA: Insufficient documentation

## 2019-10-28 DIAGNOSIS — Z9481 Bone marrow transplant status: Secondary | ICD-10-CM | POA: Insufficient documentation

## 2019-10-28 DIAGNOSIS — N179 Acute kidney failure, unspecified: Secondary | ICD-10-CM

## 2019-10-28 DIAGNOSIS — Z5111 Encounter for antineoplastic chemotherapy: Secondary | ICD-10-CM

## 2019-10-28 DIAGNOSIS — D696 Thrombocytopenia, unspecified: Secondary | ICD-10-CM

## 2019-10-28 DIAGNOSIS — R5383 Other fatigue: Secondary | ICD-10-CM | POA: Insufficient documentation

## 2019-10-28 DIAGNOSIS — Z79899 Other long term (current) drug therapy: Secondary | ICD-10-CM | POA: Insufficient documentation

## 2019-10-28 LAB — CBC WITH DIFFERENTIAL/PLATELET
Abs Immature Granulocytes: 0.01 10*3/uL (ref 0.00–0.07)
Basophils Absolute: 0 10*3/uL (ref 0.0–0.1)
Basophils Relative: 0 %
Eosinophils Absolute: 0.1 10*3/uL (ref 0.0–0.5)
Eosinophils Relative: 1 %
HCT: 36.2 % — ABNORMAL LOW (ref 39.0–52.0)
Hemoglobin: 12.4 g/dL — ABNORMAL LOW (ref 13.0–17.0)
Immature Granulocytes: 0 %
Lymphocytes Relative: 33 %
Lymphs Abs: 1.8 10*3/uL (ref 0.7–4.0)
MCH: 34 pg (ref 26.0–34.0)
MCHC: 34.3 g/dL (ref 30.0–36.0)
MCV: 99.2 fL (ref 80.0–100.0)
Monocytes Absolute: 0.5 10*3/uL (ref 0.1–1.0)
Monocytes Relative: 10 %
Neutro Abs: 3.2 10*3/uL (ref 1.7–7.7)
Neutrophils Relative %: 56 %
Platelets: 80 10*3/uL — ABNORMAL LOW (ref 150–400)
RBC: 3.65 MIL/uL — ABNORMAL LOW (ref 4.22–5.81)
RDW: 12.8 % (ref 11.5–15.5)
WBC: 5.6 10*3/uL (ref 4.0–10.5)
nRBC: 0 % (ref 0.0–0.2)

## 2019-10-28 LAB — COMPREHENSIVE METABOLIC PANEL
ALT: 23 U/L (ref 0–44)
AST: 24 U/L (ref 15–41)
Albumin: 4.7 g/dL (ref 3.5–5.0)
Alkaline Phosphatase: 66 U/L (ref 38–126)
Anion gap: 10 (ref 5–15)
BUN: 21 mg/dL (ref 8–23)
CO2: 25 mmol/L (ref 22–32)
Calcium: 9.6 mg/dL (ref 8.9–10.3)
Chloride: 104 mmol/L (ref 98–111)
Creatinine, Ser: 1.23 mg/dL (ref 0.61–1.24)
GFR calc Af Amer: >60 mL/min (ref >60)
GFR calc non Af Amer: >60 mL/min (ref >60)
Glucose, Bld: 111 mg/dL — ABNORMAL HIGH (ref 70–99)
Potassium: 4 mmol/L (ref 3.5–5.1)
Sodium: 139 mmol/L (ref 135–145)
Total Bilirubin: 0.8 mg/dL (ref 0.3–1.2)
Total Protein: 7.2 g/dL (ref 6.5–8.1)

## 2019-10-28 LAB — MAGNESIUM: Magnesium: 1.8 mg/dL (ref 1.7–2.4)

## 2019-10-28 MED ORDER — DENOSUMAB 120 MG/1.7ML ~~LOC~~ SOLN
120.0000 mg | Freq: Once | SUBCUTANEOUS | Status: AC
  Administered 2019-10-28: 120 mg via SUBCUTANEOUS
  Filled 2019-10-28: qty 1.7

## 2019-10-28 NOTE — Telephone Encounter (Signed)
Pt said his oncologist with [facility] wants pt to contact Dr Damita Dunnings to see if it is OK for pt to get covid vaccine and if it is OK for pt to get the vaccine when can pt get vaccine and how does pt sign up for covid vaccine appt. Pt request cb.

## 2019-10-28 NOTE — Progress Notes (Signed)
Patient verified using two identifiers for virtual visit via telephone today.  Patient does not offer any problems today.  

## 2019-10-28 NOTE — Progress Notes (Signed)
HEMATOLOGY-ONCOLOGY TeleHEALTH VISIT PROGRESS NOTE  I connected with Luis Burke on 10/28/19 at 10:45 AM EST by video enabled telemedicine visit and verified that I am speaking with the correct person using two identifiers. I discussed the limitations, risks, security and privacy concerns of performing an evaluation and management service by telemedicine and the availability of in-person appointments. I also discussed with the patient that there may be a patient responsible charge related to this service. The patient expressed understanding and agreed to proceed.   Other persons participating in the visit and their role in the encounter:  None  Patient's location: Home  Provider's location: office Chief Complaint: Multiple myeloma   INTERVAL HISTORY Luis Burke is a 64 y.o. male who has above history reviewed by me today presents for follow up visit for management of multiple myeloma Problems and complaints are listed below:  # Seen by Dr.Choi at [facility] in December 2020. Patient currently is on ixazomib 3 mgD1/D8/D15 out of 28 days cycles. He tolerates well. His insurance has changed and he is waiting for the medication supply to arrive.  Review of Systems  Constitutional: Positive for fatigue. Negative for appetite change, chills, fever and unexpected weight change.  HENT:   Negative for hearing loss and voice change.   Eyes: Negative for eye problems and icterus.  Respiratory: Negative for chest tightness, cough and shortness of breath.   Cardiovascular: Negative for chest pain and leg swelling.  Gastrointestinal: Negative for abdominal distention and abdominal pain.  Endocrine: Negative for hot flashes.  Genitourinary: Negative for difficulty urinating, dysuria and frequency.   Musculoskeletal: Negative for arthralgias.  Skin: Negative for itching and rash.  Neurological: Negative for light-headedness and numbness.  Hematological: Negative for adenopathy. Does not  bruise/bleed easily.  Psychiatric/Behavioral: Negative for confusion.    Past Medical History:  Diagnosis Date  . AKI (acute kidney injury) ([place]) 01/06/2019  . Anemia   . 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  Marland Kitchen Fatty liver    on u/s 07/2018  . Hyperlipidemia 03/2004  . Hypertension 1994  . Snores    Past Surgical History:  Procedure Laterality Date  . CATARACT EXTRACTION W/PHACO Right 03/04/2018   Procedure: CATARACT EXTRACTION PHACO AND INTRAOCULAR LENS PLACEMENT ([place])  RIGHT TORIC;  Surgeon: Leandrew Koyanagi, MD;  Location: [facility];  Service: Ophthalmology;  Laterality: Right;  Per [place] no Toric Lens 1:45 5.3  . COLONOSCOPY WITH PROPOFOL N/A 07/20/2018   Procedure: COLONOSCOPY WITH PROPOFOL;  Surgeon: Jonathon Bellows, MD;  Location: [facility] ENDOSCOPY;  Service: Gastroenterology;  Laterality: N/A;  . ESOPHAGOGASTRODUODENOSCOPY (EGD) WITH PROPOFOL N/A 07/20/2018   Procedure: ESOPHAGOGASTRODUODENOSCOPY (EGD) WITH PROPOFOL;  Surgeon: Jonathon Bellows, MD;  Location: [facility] ENDOSCOPY;  Service: Gastroenterology;  Laterality: N/A;  . EYE SURGERY Right   . HERNIA REPAIR    . L Lap herniorraphy  04/2000  . Left wrist ganglionectomy      Family History  Problem Relation Age of Onset  . Hypertension Mother   . Diabetes Mother   . Heart disease Mother        CAD  . Dementia Mother   . Hypertension Father   . Heart disease Father        MI 02/03  . Colon cancer Father   . Hypertension Sister   . Hypertension Sister   . Hypertension Sister   . Prostate cancer Neg Hx     Social History  Socioeconomic History  . Marital status: Single    Spouse name: Not on file  . Number of children: 1  . Years of education: Not on file  . Highest education level: Not on file  Occupational History  . Occupation: [employer]  Tobacco Use  . Smoking status: Never Smoker  . Smokeless tobacco: Never Used  . Tobacco comment:  Occassionally  Substance and Sexual Activity  . Alcohol use: Not Currently    Alcohol/week: 3.0 standard drinks    Types: 3 Cans of beer per week  . Drug use: No  . Sexual activity: Not on file  Other Topics Concern  . Not on file  Social History Narrative   Divorced 12/22/2007, dating as of 2017/12/21   His daughter died 4 days after giving birth to the patient's granddaughter   Has joint custody of his dead daughter's child   Works at [employer] is [employer]:   . Difficulty of Paying Living Expenses: Not on file  Food Insecurity:   . Worried About Charity fundraiser in the Last Year: Not on file  . Ran Out of Food in the Last Year: Not on file  Transportation Needs:   . Lack of Transportation (Medical): Not on file  . Lack of Transportation (Non-Medical): Not on file  Physical Activity:   . Days of Exercise per Week: Not on file  . Minutes of Exercise per Session: Not on file  Stress:   . Feeling of Stress : Not on file  Social Connections:   . Frequency of Communication with Friends and Family: Not on file  . Frequency of Social Gatherings with Friends and Family: Not on file  . Attends Religious Services: Not on file  . Active Member of Clubs or Organizations: Not on file  . Attends Archivist Meetings: Not on file  . Marital Status: Not on file  Intimate Partner Violence:   . Fear of Current or Ex-Partner: Not on file  . Emotionally Abused: Not on file  . Physically Abused: Not on file  . Sexually Abused: Not on file    Current Outpatient Medications on File Prior to Visit  Medication Sig Dispense Refill  . acetaminophen (TYLENOL) 325 MG tablet Take 650 mg by mouth every 6 (six) hours as needed.    Marland Kitchen acyclovir (ZOVIRAX) 400 MG tablet Take 1 tablet (400 mg total) by mouth 2 (two) times daily. 60 tablet 11  . amLODipine (NORVASC) 5 MG tablet Take 2 tablets (10 mg total) by mouth daily.    . clopidogrel  (PLAVIX) 75 MG tablet Take by mouth.    . co-enzyme Q-10 50 MG capsule Take 50 mg by mouth daily.    . [employer] CLOTRIMAZOLE 1 % cream APPLY TO AFFECTED AREA 3 TIMES A DAY FOR 10 DAYS    . cyanocobalamin ([employer] VITAMIN B12) 1000 MCG tablet Take 1 tablet (1,000 mcg total) by mouth daily. 90 tablet 3  . cyclobenzaprine (FLEXERIL) 5 MG tablet Take 1 tablet (5 mg total) by mouth 3 (three) times daily as needed for muscle spasms. 30 tablet 0  . famotidine (PEPCID) 20 MG tablet Take 20 mg by mouth 2 (two) times daily.    . hydrALAZINE (APRESOLINE) 10 MG tablet TAKE 1 TABLET BY MOUTH THREE TIMES A DAY 90 tablet 1  . ixazomib citrate (NINLARO) 3 MG capsule Take 1 capsule (3 mg) by mouth weekly, 3 weeks on,  1 week off, repeat every 4 weeks. Take on an empty stomach 1hr before or 2hr after meals. 3 capsule 3  . lisinopril (ZESTRIL) 20 MG tablet Take 1 tablet (20 mg total) by mouth daily. 90 tablet 3  . LORazepam (ATIVAN) 1 MG tablet TAKE 1 TABLET (1 MG TOTAL) BY MOUTH EVERY 6 (SIX) HOURS AS NEEDED FOR ANXIETY (FOR NAUSEA)    . magnesium chloride (SLOW-MAG) 64 MG TBEC SR tablet Take 1 tablet (64 mg total) by mouth daily. 60 tablet 0  . metoprolol succinate (TOPROL-XL) 50 MG 24 hr tablet TAKE 2-3 TABLETS (100-150 MG TOTAL) BY MOUTH DAILY. 270 tablet 3  . ondansetron (ZOFRAN) 8 MG tablet Take 8 mg by mouth every 8 (eight) hours as needed. for nausea    . pravastatin (PRAVACHOL) 10 MG tablet Take 1 tablet (10 mg total) by mouth daily. 90 tablet 3  . prochlorperazine (COMPAZINE) 10 MG tablet TAKE 1 TABLET (10 MG TOTAL) BY MOUTH EVERY 6 (SIX) HOURS AS NEEDED FOR NAUSEA    . zinc gluconate 50 MG tablet Take 50 mg by mouth daily.     No current facility-administered medications on file prior to visit.    Allergies  Allergen Reactions  . Bactrim [Sulfamethoxazole-Trimethoprim]     Elevated creatinine  . Clonidine Derivatives     Low platelets, edema  . Tadalafil     Intolerant,  Didn't feel well on med        Observations/Objective: Today's Vitals   10/28/19 1047  PainSc: 0-No pain   There is no height or weight on file to calculate BMI.  Physical Exam  Constitutional: He is oriented to person, place, and time. No distress.  Neurological: He is alert and oriented to person, place, and time.    CBC    Component Value Date/Time   WBC 5.6 10/28/2019 1009   RBC 3.65 (L) 10/28/2019 1009   HGB 12.4 (L) 10/28/2019 1009   HCT 36.2 (L) 10/28/2019 1009   PLT 80 (L) 10/28/2019 1009   MCV 99.2 10/28/2019 1009   MCH 34.0 10/28/2019 1009   MCHC 34.3 10/28/2019 1009   RDW 12.8 10/28/2019 1009   LYMPHSABS 1.8 10/28/2019 1009   MONOABS 0.5 10/28/2019 1009   EOSABS 0.1 10/28/2019 1009   BASOSABS 0.0 10/28/2019 1009    CMP     Component Value Date/Time   NA 139 10/28/2019 1009   K 4.0 10/28/2019 1009   CL 104 10/28/2019 1009   CO2 25 10/28/2019 1009   GLUCOSE 111 (H) 10/28/2019 1009   BUN 21 10/28/2019 1009   CREATININE 1.23 10/28/2019 1009   CALCIUM 9.6 10/28/2019 1009   PROT 7.2 10/28/2019 1009   ALBUMIN 4.7 10/28/2019 1009   AST 24 10/28/2019 1009   ALT 23 10/28/2019 1009   ALKPHOS 66 10/28/2019 1009   BILITOT 0.8 10/28/2019 1009   GFRNONAA >60 10/28/2019 1009   GFRAA >60 10/28/2019 1009     Assessment and Plan: 1. Multiple myeloma, remission status unspecified ([place])   2. 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 Thrombocytopenia ([place])   4. 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  Currently on ixazomib Maintenance, tolerates well.  Labs are reviewed and discussed with patient. Anemia continues to improve, hemoglobin 12.4. Thrombocytopenia, platelet count 80,000.  Stable. Continue current maintenance regimen. Continue with acyclovir.  #Bone health, patient will receive Xgeva monthly.  Proceed with Xgeva today.  Recommend patient to continue take calcium and vitamin D oral  supplementation. #Regarding to COVID-19 vaccine, I discussed with patient's [facility] bone marrow transplant physician Dr. Maylene Roes who agrees that patient may get COVID-19 vaccine.  Patient should continue COVID-19 precaution after vaccination.  Discussed with patient. #  Follow Up Instructions: Follow-up in 4 weeks   I discussed the assessment and treatment plan with the patient. The patient was provided an opportunity to ask questions and all were answered. The patient agreed with the plan and demonstrated an understanding of the instructions.  The patient was advised to call back or seek an in-person evaluation if the symptoms worsen or if the condition fails to improve as anticipated.     Earlie Server, MD 10/28/2019 8:03 PM

## 2019-10-29 ENCOUNTER — Other Ambulatory Visit: Payer: Self-pay | Admitting: Family Medicine

## 2019-10-29 NOTE — Telephone Encounter (Signed)
From my standpoint, it is okay for the patient to get the Covid vaccine.  He had already started receiving his other vaccines per protocol.  Please give him the website about getting scheduled.  I pasted below.  Thanks.  [email or web address]

## 2019-10-29 NOTE — Telephone Encounter (Signed)
Notified pt--[facility] website and phone number [phone] register for Covid vaccine.

## 2019-11-01 NOTE — Telephone Encounter (Signed)
Oral Oncology Patient Advocate Encounter  10/28/19:  Met patient in lobby to complete application for [employer] Patient Assistance Program in an effort to reduce patient's out of pocket expense for Ninlaro to $0.   Application will be faxed when financial documents are received.   [employer] PAP phone number: [phone] Fax number: [phone]  This encounter will be updated until final determination.   [place] Patient [place] Phone [phone] Fax [phone]

## 2019-11-02 NOTE — Telephone Encounter (Signed)
Faxed application [id number] to [phone].

## 2019-11-03 ENCOUNTER — Other Ambulatory Visit: Payer: Self-pay | Admitting: Oncology

## 2019-11-03 DIAGNOSIS — C9000 Multiple myeloma not having achieved remission: Secondary | ICD-10-CM

## 2019-11-03 MED ORDER — IXAZOMIB CITRATE 3 MG PO CAPS: ORAL_CAPSULE | ORAL | 3 refills | Status: DC

## 2019-11-03 NOTE — Telephone Encounter (Signed)
[facility] called today.  I returned their call to see what information they were needing.  An rx for the Kennieth Rad is needed so they can verify the patient has no insurance coverage.  I will message Dr Tasia Catchings to get a prescription and fax to [phone].  [place] Patient [place] Phone [phone] Fax [phone] 11/03/2019 4:03 PM

## 2019-11-04 NOTE — Telephone Encounter (Signed)
Oral Oncology Patient Advocate Encounter  Received notification from [facility] Patient Assistance program that patient has been successfully enrolled into their program to receive Ninlaro from the manufacturer at $0 out of pocket until 11/03/2020.    I called and spoke with patient.  He knows we will have to re-apply.   Patient knows to call the office with questions or concerns.   Oral Oncology Clinic will continue to follow.  [place] Patient [place] Phone [phone] Fax [phone] 11/04/2019 4:13 PM

## 2019-11-08 ENCOUNTER — Telehealth: Payer: Self-pay

## 2019-11-08 NOTE — Telephone Encounter (Signed)
Per message from Denn[place] Nancy: "Luis Burke has been approved to get his Ninlaro from the manufacturer. He wanted to know if he needed labs before he started his next fill and what day to start."   Per Dr. Tasia Catchings, no labs needed and he may start once he receives medication. patient has been notified.

## 2019-11-15 ENCOUNTER — Other Ambulatory Visit: Payer: Self-pay

## 2019-11-15 ENCOUNTER — Telehealth: Payer: Self-pay | Admitting: *Deleted

## 2019-11-15 DIAGNOSIS — I6529 Occlusion and stenosis of unspecified carotid artery: Secondary | ICD-10-CM

## 2019-11-15 NOTE — Telephone Encounter (Signed)
Sure. Will get it along with his other blood work with me.  Heather, please change cholesterol order to my name. Thanks.

## 2019-11-15 NOTE — Telephone Encounter (Signed)
Order has been entered

## 2019-11-15 NOTE — Telephone Encounter (Signed)
I'll check with Dr. Tasia Catchings to see if he can have lipid level drawn at the visit there.  The order is in the EMR. If so, then he can cancel the lab visit here.  App help of all involved.

## 2019-11-15 NOTE — Telephone Encounter (Signed)
Patient advised.  Lab appt canceled. 

## 2019-11-15 NOTE — Telephone Encounter (Signed)
Patient left a voicemail stating that he is scheduled for lab work here on 11/18/19. Patient stated that he is also scheduled for lab work at the [facility] on 11/25/19. Patient wanted to know if these labs could be combined?  Patient requested a call back regarding his up coming labs.

## 2019-11-16 NOTE — Telephone Encounter (Signed)
I thank all involved. 

## 2019-11-18 ENCOUNTER — Other Ambulatory Visit: Payer: BC Managed Care – PPO

## 2019-11-19 ENCOUNTER — Encounter: Payer: Self-pay | Admitting: Oncology

## 2019-11-25 ENCOUNTER — Inpatient Hospital Stay: Payer: BC Managed Care – PPO | Attending: Oncology

## 2019-11-25 ENCOUNTER — Inpatient Hospital Stay: Payer: BC Managed Care – PPO

## 2019-11-25 ENCOUNTER — Inpatient Hospital Stay (HOSPITAL_BASED_OUTPATIENT_CLINIC_OR_DEPARTMENT_OTHER): Payer: BC Managed Care – PPO | Admitting: Oncology

## 2019-11-25 ENCOUNTER — Other Ambulatory Visit: Payer: Self-pay

## 2019-11-25 ENCOUNTER — Encounter: Payer: Self-pay | Admitting: Oncology

## 2019-11-25 VITALS — BP 141/95 | HR 73 | Temp 97.4°F | Resp 18 | Wt 205.6 lb

## 2019-11-25 DIAGNOSIS — N179 Acute kidney failure, unspecified: Secondary | ICD-10-CM

## 2019-11-25 DIAGNOSIS — C9000 Multiple myeloma not having achieved remission: Secondary | ICD-10-CM | POA: Diagnosis not present

## 2019-11-25 DIAGNOSIS — I6529 Occlusion and stenosis of unspecified carotid artery: Secondary | ICD-10-CM

## 2019-11-25 DIAGNOSIS — N189 Chronic kidney disease, unspecified: Secondary | ICD-10-CM | POA: Insufficient documentation

## 2019-11-25 DIAGNOSIS — R634 Abnormal weight loss: Secondary | ICD-10-CM | POA: Insufficient documentation

## 2019-11-25 DIAGNOSIS — M8580 Other specified disorders of bone density and structure, unspecified site: Secondary | ICD-10-CM

## 2019-11-25 DIAGNOSIS — Z9481 Bone marrow transplant status: Secondary | ICD-10-CM | POA: Insufficient documentation

## 2019-11-25 DIAGNOSIS — N1831 Chronic kidney disease, stage 3a: Secondary | ICD-10-CM

## 2019-11-25 DIAGNOSIS — D649 Anemia, unspecified: Secondary | ICD-10-CM | POA: Insufficient documentation

## 2019-11-25 DIAGNOSIS — D696 Thrombocytopenia, unspecified: Secondary | ICD-10-CM | POA: Insufficient documentation

## 2019-11-25 DIAGNOSIS — Z5111 Encounter for antineoplastic chemotherapy: Secondary | ICD-10-CM | POA: Diagnosis not present

## 2019-11-25 DIAGNOSIS — R5383 Other fatigue: Secondary | ICD-10-CM | POA: Insufficient documentation

## 2019-11-25 DIAGNOSIS — Z79899 Other long term (current) drug therapy: Secondary | ICD-10-CM | POA: Insufficient documentation

## 2019-11-25 DIAGNOSIS — E785 Hyperlipidemia, unspecified: Secondary | ICD-10-CM | POA: Diagnosis not present

## 2019-11-25 DIAGNOSIS — I129 Hypertensive chronic kidney disease with stage 1 through stage 4 chronic kidney disease, or unspecified chronic kidney disease: Secondary | ICD-10-CM | POA: Insufficient documentation

## 2019-11-25 DIAGNOSIS — M542 Cervicalgia: Secondary | ICD-10-CM | POA: Diagnosis not present

## 2019-11-25 DIAGNOSIS — R1030 Lower abdominal pain, unspecified: Secondary | ICD-10-CM | POA: Diagnosis not present

## 2019-11-25 DIAGNOSIS — K760 Fatty (change of) liver, not elsewhere classified: Secondary | ICD-10-CM | POA: Insufficient documentation

## 2019-11-25 LAB — CBC WITH DIFFERENTIAL/PLATELET
Abs Immature Granulocytes: 0.02 10*3/uL (ref 0.00–0.07)
Basophils Absolute: 0 10*3/uL (ref 0.0–0.1)
Basophils Relative: 0 %
Eosinophils Absolute: 0.1 10*3/uL (ref 0.0–0.5)
Eosinophils Relative: 1 %
HCT: 37 % — ABNORMAL LOW (ref 39.0–52.0)
Hemoglobin: 12.3 g/dL — ABNORMAL LOW (ref 13.0–17.0)
Immature Granulocytes: 0 %
Lymphocytes Relative: 25 %
Lymphs Abs: 1.5 10*3/uL (ref 0.7–4.0)
MCH: 33.2 pg (ref 26.0–34.0)
MCHC: 33.2 g/dL (ref 30.0–36.0)
MCV: 99.7 fL (ref 80.0–100.0)
Monocytes Absolute: 0.6 10*3/uL (ref 0.1–1.0)
Monocytes Relative: 11 %
Neutro Abs: 3.7 10*3/uL (ref 1.7–7.7)
Neutrophils Relative %: 63 %
Platelets: 92 10*3/uL — ABNORMAL LOW (ref 150–400)
RBC: 3.71 MIL/uL — ABNORMAL LOW (ref 4.22–5.81)
RDW: 12.9 % (ref 11.5–15.5)
WBC: 5.8 10*3/uL (ref 4.0–10.5)
nRBC: 0 % (ref 0.0–0.2)

## 2019-11-25 LAB — COMPREHENSIVE METABOLIC PANEL
ALT: 27 U/L (ref 0–44)
AST: 25 U/L (ref 15–41)
Albumin: 4.7 g/dL (ref 3.5–5.0)
Alkaline Phosphatase: 61 U/L (ref 38–126)
Anion gap: 11 (ref 5–15)
BUN: 20 mg/dL (ref 8–23)
CO2: 25 mmol/L (ref 22–32)
Calcium: 9.7 mg/dL (ref 8.9–10.3)
Chloride: 99 mmol/L (ref 98–111)
Creatinine, Ser: 1.47 mg/dL — ABNORMAL HIGH (ref 0.61–1.24)
GFR calc Af Amer: 58 mL/min — ABNORMAL LOW (ref >60)
GFR calc non Af Amer: 50 mL/min — ABNORMAL LOW (ref >60)
Glucose, Bld: 104 mg/dL — ABNORMAL HIGH (ref 70–99)
Potassium: 3.6 mmol/L (ref 3.5–5.1)
Sodium: 135 mmol/L (ref 135–145)
Total Bilirubin: 0.7 mg/dL (ref 0.3–1.2)
Total Protein: 7.6 g/dL (ref 6.5–8.1)

## 2019-11-25 LAB — LIPID PANEL
Cholesterol: 185 mg/dL (ref 0–200)
HDL: 34 mg/dL — ABNORMAL LOW (ref >40)
LDL Cholesterol: 99 mg/dL (ref 0–99)
Total CHOL/HDL Ratio: 5.4 RATIO
Triglycerides: 261 mg/dL — ABNORMAL HIGH (ref <150)
VLDL: 52 mg/dL — ABNORMAL HIGH (ref 0–40)

## 2019-11-25 MED ORDER — DENOSUMAB 120 MG/1.7ML ~~LOC~~ SOLN
120.0000 mg | Freq: Once | SUBCUTANEOUS | Status: AC
  Administered 2019-11-25: 120 mg via SUBCUTANEOUS
  Filled 2019-11-25: qty 1.7

## 2019-11-25 NOTE — Progress Notes (Signed)
Hematology/Oncology  Follow up note [facility] [phone] [phone]   Patient Care Team: Tonia Ghent, MD as PCP - General (Family Medicine)  REFERRING PROVIDER: Napoleon Form REASON FOR VISIT Follow up for multiple myeloma.  Groin pain.  HISTORY OF PRESENTING ILLNESS:  Luis Burke is a  64 y.o.  male with PMH listed below who was referred to me for evaluation of anemia and thrombocytopenia Associated signs and symptoms: Patient reports feeling fatigue.  Denies SOB with exertion.  Denies weight loss, easy bruising, hematochezia, hemoptysis, hematuria. Context: History of GI bleeding: Denies               History of Chronic kidney disease yes               History of autoimmune disease denies denies               History of hemolytic anemia.                Last colonoscopy: 07/20/2018 upper and lower endoscopy showed esophagitis, normal examined duodenum, 2 polyps removed in the ascending colon, resected and retrieved.  Otherwise normal. He drinks alcohol, usually a few beers during the weekends.  Denies any smoking history.   Intended weight loss 3 pounds for the past 3 months.  # 07/27/2018 multiple myeloma panel showed M protein of 0.1, IgG 662, IgA 49, IgM 7. Patient was called back to further labs done. 08/10/2018, free light chain ratio showed extremely high level of kappa free light chain 10,183, with a kappa lambda light chain ratio of 1414.31 LDH 164 Beta-2 microglobulin 5 Patient was called and discuss about results.  He was recommended to undergo bone marrow biopsy and PET scan. 08/19/2018 bone marrow biopsy showed hypercellular marrow 80%, involved by plasma cell neoplasm up to 95%.  Consistent with plasma cell myeloma.  FISH and cytogenetics are pending.  09/10/2018 skeletal survey showed questionable small lucent lesions within the midshaft of the humerus bilaterally.  Otherwise no suspicious focal lytic lesion or acute bone  abnormality. 08/25/2018 PET scan showed no definite hypermetabolic bone disease but CT findings are highly suspicious for numerous small myelomatous lesions involving the spine, sternum and scattered ribs.  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 He received preparative regimen with melphalan 200 mg/m on 04/22/2019 followed by autologous stem cell infusion on 04/23/2019. Currently he is transfusion independent.  Transfusion criteria with as needed hemoglobin less than 7.5 for platelet less than 10,000.  Transplant course was complicated with febrile neutropenia grade 3, treated with vancomycin and cephapirin until engraftment on 05/06/2019. Patient also had engraftment syndrome and had to be started on Solu-Medrol 25 mg twice daily on 05/05/2019.  Transition to Medrol Dosepak on 05/07/2019 to complete steroid taper as outpatient.  Patient is currently on acyclovir for 1 year after transplant, dose was reduced on 722 11/20/2018 due to renal function. Patient had a baseline CKD with creatinine running between 1.4-1.6.  He had acute on chronic kidney injury and creatinine went up to 2.2 on 722.  Fluid hydration was given and creatinine decreased to baseline 1.5-1.7. His Hickman catheter was removed.  Candida Cruris treated with topical clotrimazole 1% 3 times daily and to resolution..  #  seen by Dr. Tonia Brooms BMT team on 06/30/2019.revaccination at [facility] begins at 12 months post transplant I discussed with [facility] hematology Dr.Choi and he recommends plans as listed below.  -patient to be started on Ixazomib  maintenance: -Ixazomib '3mg'$  on D1/D8/D15 out of 28 day cycle.  -patient will start re-vaccination 12 months post transplant.  -Post transplant bone marrow biopsy if clinically indicated.   INTERVAL HISTORY Luis Burke is a 64 y.o. 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 #Patient is currently on  exacerbated 3 mg on day 1, day 8, day 15 out of 28 days. He reports tolerating well. He has chronic neck and left shoulder soreness.  Previous cervical spine x-ray showed osteopenia, DJD. He has no other new complaints.  Review of Systems  Constitutional: Negative for appetite change, chills, fatigue, fever and unexpected weight change.  HENT:   Negative for hearing loss and voice change.   Eyes: Negative for eye problems and icterus.  Respiratory: Negative for chest tightness, cough and shortness of breath.   Cardiovascular: Negative for chest pain and leg swelling.  Gastrointestinal: Negative for abdominal distention and abdominal pain.  Endocrine: Negative for hot flashes.  Genitourinary: Negative for difficulty urinating, dysuria and frequency.   Musculoskeletal: Positive for neck pain. Negative for arthralgias.  Skin: Negative for itching and rash.  Neurological: Negative for light-headedness and numbness.  Hematological: Negative for adenopathy. Does not bruise/bleed easily.  Psychiatric/Behavioral: Negative for confusion.    MEDICAL HISTORY:  Past Medical History:  Diagnosis Date  . AKI (acute kidney injury) ([place]) 01/06/2019  . Anemia   . 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[place])    autologous stem cell bone marrow transplant on 04/23/2019.  Marland Kitchen Fatty liver    on u/s 07/2018  . Hyperlipidemia 03/2004  . Hypertension 1994  . Snores     SURGICAL HISTORY: Past Surgical History:  Procedure Laterality Date  . CATARACT EXTRACTION W/PHACO Right 03/04/2018   Procedure: CATARACT EXTRACTION PHACO AND INTRAOCULAR LENS PLACEMENT ([place])  RIGHT TORIC;  Surgeon: Leandrew Koyanagi, MD;  Location: [facility];  Service: Ophthalmology;  Laterality: Right;  [facility] no Toric Lens 1:45 5.3  . COLONOSCOPY WITH PROPOFOL N/A 07/20/2018   Procedure: COLONOSCOPY WITH PROPOFOL;  Surgeon: Jonathon Bellows, MD;  Location[facility] ENDOSCOPY;  Service: Gastroenterology;  Laterality: N/A;  .  ESOPHAGOGASTRODUODENOSCOPY (EGD) WITH PROPOFOL N/A 07/20/2018   Procedure: ESOPHAGOGASTRODUODENOSCOPY (EGD) WITH PROPOFOL;  Surgeon: Jonathon Bellows, MD;  Location: [facility] ENDOSCOPY;  Service: Gastroenterology;  Laterality: N/A;  . EYE SURGERY Right   . HERNIA REPAIR    . L Lap herniorraphy  04/2000  . Left wrist ganglionectomy      SOCIAL HISTORY: Social History   Socioeconomic History  . Marital status: Single    Spouse name: Not on file  . Number of children: 1  . Years of education: Not on file  . Highest education level: Not on file  Occupational History  . Occupation: [employer]  Tobacco Use  . Smoking status: Never Smoker  . Smokeless tobacco: Never Used  . Tobacco comment: Occassionally  Substance and Sexual Activity  . Alcohol use: Not Currently    Alcohol/week: 3.0 standard drinks    Types: 3 Cans of beer per week  . Drug use: No  . Sexual activity: Not on file  Other Topics Concern  . Not on file  Social History Narrative   Divorced Dec 26, 2007, dating as of 12-25-2017   His daughter died 4 days after giving birth to the patient's granddaughter   Has joint custody of his dead daughter's child   Works at [employer] is [employer]  of [employer]   Financial Resource Strain:   . Difficulty of Paying Living Expenses: Not on file  Food Insecurity:   . Worried About Charity fundraiser in the Last Year: Not on file  . Ran Out of Food in the Last Year: Not on file  Transportation Needs:   . Lack of Transportation (Medical): Not on file  . Lack of Transportation (Non-Medical): Not on file  Physical Activity:   . Days of Exercise per Week: Not on file  . Minutes of Exercise per Session: Not on file  Stress:   . Feeling of Stress : Not on file  Social Connections:   . Frequency of Communication with Friends and Family: Not on file  . Frequency of Social Gatherings with Friends and Family: Not on file  . Attends Religious Services: Not on file  . Active  Member of Clubs or Organizations: Not on file  . Attends Archivist Meetings: Not on file  . Marital Status: Not on file  Intimate Partner Violence:   . Fear of Current or Ex-Partner: Not on file  . Emotionally Abused: Not on file  . Physically Abused: Not on file  . Sexually Abused: Not on file    FAMILY HISTORY: Family History  Problem Relation Age of Onset  . Hypertension Mother   . Diabetes Mother   . Heart disease Mother        CAD  . Dementia Mother   . Hypertension Father   . Heart disease Father        MI 02/03  . Colon cancer Father   . Hypertension Sister   . Hypertension Sister   . Hypertension Sister   . Prostate cancer Neg Hx     ALLERGIES:  is allergic to bactrim [sulfamethoxazole-trimethoprim]; clonidine derivatives; and tadalafil.  MEDICATIONS:  Current Outpatient Medications  Medication Sig Dispense Refill  . acetaminophen (TYLENOL) 325 MG tablet Take 650 mg by mouth every 6 (six) hours as needed.    Marland Kitchen acyclovir (ZOVIRAX) 400 MG tablet Take 1 tablet (400 mg total) by mouth 2 (two) times daily. 60 tablet 11  . amLODipine (NORVASC) 5 MG tablet TAKE 1-2 TABLETS (5-10 MG TOTAL) BY MOUTH DAILY. 180 tablet 3  . clopidogrel (PLAVIX) 75 MG tablet Take by mouth.    . co-enzyme Q-10 50 MG capsule Take 50 mg by mouth daily.    . cyanocobalamin (CVS VITAMIN B12) 1000 MCG tablet Take 1 tablet (1,000 mcg total) by mouth daily. 90 tablet 3  . famotidine (PEPCID) 20 MG tablet Take 20 mg by mouth 2 (two) times daily.    . hydrALAZINE (APRESOLINE) 10 MG tablet TAKE 1 TABLET BY MOUTH THREE TIMES A DAY 90 tablet 1  . ixazomib citrate (NINLARO) 3 MG capsule Take 1 capsule (3 mg) by mouth weekly, 3 weeks on, 1 week off, repeat every 4 weeks. Take on an empty stomach 1hr before or 2hr after meals. 3 capsule 3  . lisinopril (ZESTRIL) 20 MG tablet Take 1 tablet (20 mg total) by mouth daily. 90 tablet 3  . LORazepam (ATIVAN) 1 MG tablet TAKE 1 TABLET (1 MG TOTAL) BY MOUTH  EVERY 6 (SIX) HOURS AS NEEDED FOR ANXIETY (FOR NAUSEA)    . magnesium chloride (SLOW-MAG) 64 MG TBEC SR tablet Take 1 tablet (64 mg total) by mouth daily. 60 tablet 0  . metoprolol succinate (TOPROL-XL) 50 MG 24 hr tablet TAKE 2-3 TABLETS (100-150 MG TOTAL) BY MOUTH DAILY. 270 tablet 3  .  ondansetron (ZOFRAN) 8 MG tablet Take 8 mg by mouth every 8 (eight) hours as needed. for nausea    . pravastatin (PRAVACHOL) 10 MG tablet Take 1 tablet (10 mg total) by mouth daily. 90 tablet 3  . prochlorperazine (COMPAZINE) 10 MG tablet TAKE 1 TABLET (10 MG TOTAL) BY MOUTH EVERY 6 (SIX) HOURS AS NEEDED FOR NAUSEA    . zinc gluconate 50 MG tablet Take 50 mg by mouth daily.    . CVS CLOTRIMAZOLE 1 % cream APPLY TO AFFECTED AREA 3 TIMES A DAY FOR 10 DAYS    . cyclobenzaprine (FLEXERIL) 5 MG tablet Take 1 tablet (5 mg total) by mouth 3 (three) times daily as needed for muscle spasms. (Patient not taking: Reported on 11/25/2019) 30 tablet 0   No current facility-administered medications for this visit.     PHYSICAL EXAMINATION: ECOG PERFORMANCE STATUS: 1 - Symptomatic but completely ambulatory Vitals:   11/25/19 1035  BP: (!) 141/95  Pulse: 73  Resp: 18  Temp: (!) 97.4 F (36.3 C)   Filed Weights   11/25/19 1035  Weight: 205 lb 9.6 oz (93.3 kg)    Physical Exam Constitutional:      General: He is not in acute distress. HENT:     Head: Normocephalic and atraumatic.  Eyes:     General: No scleral icterus.    Pupils: Pupils are equal, round, and reactive to light.  Cardiovascular:     Rate and Rhythm: Normal rate and regular rhythm.     Heart sounds: Normal heart sounds.  Pulmonary:     Effort: Pulmonary effort is normal. No respiratory distress.     Breath sounds: No wheezing.  Abdominal:     General: Bowel sounds are normal. There is no distension.     Palpations: Abdomen is soft. There is no mass.     Tenderness: There is no abdominal tenderness.  Musculoskeletal:        General: No  swelling or deformity. Normal range of motion.     Cervical back: Normal range of motion and neck supple.  Skin:    General: Skin is warm and dry.     Findings: No erythema or rash.  Neurological:     Mental Status: He is alert and oriented to person, place, and time. Mental status is at baseline.     Cranial Nerves: No cranial nerve deficit.     Coordination: Coordination normal.  Psychiatric:        Mood and Affect: Mood normal.        Behavior: Behavior normal.        Thought Content: Thought content normal.      LABORATORY DATA:  I have reviewed the data as listed Lab Results  Component Value Date   WBC 5.8 11/25/2019   HGB 12.3 (L) 11/25/2019   HCT 37.0 (L) 11/25/2019   MCV 99.7 11/25/2019   PLT 92 (L) 11/25/2019   Recent Labs    09/07/19 1306 10/28/19 1009 11/25/19 1003  NA 138 139 135  K 4.1 4.0 3.6  CL 108 104 99  CO2 '26 25 25  '$ GLUCOSE 100* 111* 104*  BUN '21 21 20  '$ CREATININE 1.51* 1.23 1.47*  CALCIUM 9.1 9.6 9.7  GFRNONAA 48* >60 50*  GFRAA 56* >60 58*  PROT 6.7 7.2 7.6  ALBUMIN 4.2 4.7 4.7  AST '22 24 25  '$ ALT '22 23 27  '$ ALKPHOS 72 66 61  BILITOT 0.5 0.8 0.7   Iron/TIBC/Ferritin/ %Sat  Component Value Date/Time   IRON 74 07/01/2018 0944   TIBC 250 07/01/2018 0944   FERRITIN 357 07/01/2018 0944   IRONPCTSAT 30 07/01/2018 0944    Lab Results  Component Value Date   TOTALPROTELP 6.3 09/07/2019   ALBUMINELP 3.7 03/10/2019   A1GS 0.3 03/10/2019   A2GS 0.8 03/10/2019   BETS 1.3 03/10/2019   GAMS 0.6 03/10/2019   MSPIKE Not Observed 03/10/2019   SPEI Comment 03/10/2019   Lab Results  Component Value Date   KPAFRELGTCHN 37.1 (H) 09/07/2019   LAMBDASER 11.5 09/07/2019   KAPLAMBRATIO 3.23 (H) 09/07/2019    RADIOGRAPHIC STUDIES: I have personally reviewed the radiological images as listed and agreed with the findings in the report. 10/23/2018 FINDINGS: The heart size and mediastinal contours are within normal limits. Both lungs are clear.  The visualized skeletal structures are unremarkable. IMPRESSION: No active cardiopulmonary disease.   ASSESSMENT & PLAN:  1. Multiple myeloma not having achieved remission ([place])   2. 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 Thrombocytopenia ([place])   4. Encounter for antineoplastic chemotherapy   5. Stage 3a chronic kidney disease   6. Osteopenia, unspecified location   7. Neck pain    #Light chain multiple myeloma beta 2 microglobulin 5 and normal albumin.  Stage II. cytogenetics showed normal male chromosome, MDS FISH panel showed trisomy 50- Standard Risk. S/p RVD x 9 Autologous bone marrow stem cell transplant [facility] on 04/23/2019. Labs are reviewed and discussed with patient.  Continue maintenance Ixazomib,  He tolerates well. Multiple myeloma panel is pending. Kidney function is pretty much at baseline. # Thrombocytopenia, stable. Platelet count is at 92,000.  Stable. #Anemia, hemoglobin is stable at 12.3.  Stable. #Post bone marrow transplant care, Continue acyclovir 400 mg twice daily  #Bone health/osteopenia/multiple myeloma  continue Xgeva monthly. Continue calcium and vitamin D supplementation  #Neck pain, stable, continue to monitor.  If symptoms persist and getting worse, I recommend MRI cervical spine.  Follow up in 4 weeks.    Earlie Server, MD, PhD Hematology Oncology [place] at [facility] 11/25/2019

## 2019-11-25 NOTE — Progress Notes (Signed)
Patient reports having continued neck and shoulder soreness.  Also is having a new itch on his low back.

## 2019-11-26 ENCOUNTER — Other Ambulatory Visit: Payer: Self-pay | Admitting: Family Medicine

## 2019-11-26 ENCOUNTER — Encounter: Payer: Self-pay | Admitting: Family Medicine

## 2019-11-26 DIAGNOSIS — I779 Disorder of arteries and arterioles, unspecified: Secondary | ICD-10-CM | POA: Insufficient documentation

## 2019-11-26 LAB — KAPPA/LAMBDA LIGHT CHAINS
Kappa free light chain: 31.2 mg/L — ABNORMAL HIGH (ref 3.3–19.4)
Kappa, lambda light chain ratio: 2.14 — ABNORMAL HIGH (ref 0.26–1.65)
Lambda free light chains: 14.6 mg/L (ref 5.7–26.3)

## 2019-11-26 MED ORDER — PRAVASTATIN SODIUM 10 MG PO TABS: 20.0000 mg | ORAL_TABLET | Freq: Every day | ORAL | Status: DC

## 2019-11-29 LAB — MULTIPLE MYELOMA PANEL, SERUM
Albumin SerPl Elph-Mcnc: 4.2 g/dL (ref 2.9–4.4)
Albumin/Glob SerPl: 1.6 (ref 0.7–1.7)
Alpha 1: 0.2 g/dL (ref 0.0–0.4)
Alpha2 Glob SerPl Elph-Mcnc: 0.7 g/dL (ref 0.4–1.0)
B-Globulin SerPl Elph-Mcnc: 1 g/dL (ref 0.7–1.3)
Gamma Glob SerPl Elph-Mcnc: 0.8 g/dL (ref 0.4–1.8)
Globulin, Total: 2.7 g/dL (ref 2.2–3.9)
IgA: 125 mg/dL (ref 61–437)
IgG (Immunoglobin G), Serum: 820 mg/dL (ref 603–1613)
IgM (Immunoglobulin M), Srm: 20 mg/dL (ref 20–172)
Total Protein ELP: 6.9 g/dL (ref 6.0–8.5)

## 2019-11-30 ENCOUNTER — Telehealth: Payer: Self-pay | Admitting: Pharmacy Technician

## 2019-11-30 NOTE — Telephone Encounter (Signed)
Oral Oncology Patient Advocate Encounter  Received notification from [facility] that prior authorization for Kennieth Rad is required.  PA submitted on CoverMyMeds Key [id number]  Status is pending  Oral Oncology Clinic will continue to follow.   [place] Patient [place] Phone [phone] Fax [phone] 11/30/2019 9:51 AM

## 2019-11-30 NOTE — Telephone Encounter (Signed)
Oral Oncology Patient Advocate Encounter  Prior Authorization for Luis Burke has been approved.    PA# [id number]  Effective dates: 11/30/19 through 11/28/20  Patient must fill with [employer] per test claim.  Oral Oncology Clinic will continue to follow.   [place] Patient [place] Phone [phone] Fax [phone] 11/30/2019 9:53 AM

## 2019-12-16 ENCOUNTER — Other Ambulatory Visit: Payer: Self-pay

## 2019-12-16 DIAGNOSIS — C9000 Multiple myeloma not having achieved remission: Secondary | ICD-10-CM

## 2019-12-16 MED ORDER — IXAZOMIB CITRATE 3 MG PO CAPS: ORAL_CAPSULE | ORAL | 3 refills | Status: DC

## 2019-12-16 NOTE — Addendum Note (Signed)
Addended by: Vanice Sarah on: 12/16/2019 02:23 PM   Modules accepted: Orders

## 2019-12-23 ENCOUNTER — Inpatient Hospital Stay (HOSPITAL_BASED_OUTPATIENT_CLINIC_OR_DEPARTMENT_OTHER): Payer: BC Managed Care – PPO | Admitting: Oncology

## 2019-12-23 ENCOUNTER — Other Ambulatory Visit: Payer: Self-pay

## 2019-12-23 ENCOUNTER — Encounter: Payer: Self-pay | Admitting: Oncology

## 2019-12-23 ENCOUNTER — Inpatient Hospital Stay: Payer: BC Managed Care – PPO

## 2019-12-23 ENCOUNTER — Inpatient Hospital Stay: Payer: BC Managed Care – PPO | Attending: Oncology

## 2019-12-23 VITALS — BP 143/87 | HR 89 | Temp 95.8°F | Wt 207.0 lb

## 2019-12-23 DIAGNOSIS — Z5111 Encounter for antineoplastic chemotherapy: Secondary | ICD-10-CM

## 2019-12-23 DIAGNOSIS — Z9484 Stem cells transplant status: Secondary | ICD-10-CM | POA: Diagnosis not present

## 2019-12-23 DIAGNOSIS — Z79899 Other long term (current) drug therapy: Secondary | ICD-10-CM | POA: Diagnosis not present

## 2019-12-23 DIAGNOSIS — D696 Thrombocytopenia, unspecified: Secondary | ICD-10-CM

## 2019-12-23 DIAGNOSIS — K760 Fatty (change of) liver, not elsewhere classified: Secondary | ICD-10-CM | POA: Diagnosis not present

## 2019-12-23 DIAGNOSIS — R5383 Other fatigue: Secondary | ICD-10-CM | POA: Insufficient documentation

## 2019-12-23 DIAGNOSIS — D539 Nutritional anemia, unspecified: Secondary | ICD-10-CM | POA: Insufficient documentation

## 2019-12-23 DIAGNOSIS — N1831 Chronic kidney disease, stage 3a: Secondary | ICD-10-CM | POA: Diagnosis not present

## 2019-12-23 DIAGNOSIS — C9000 Multiple myeloma not having achieved remission: Secondary | ICD-10-CM | POA: Insufficient documentation

## 2019-12-23 DIAGNOSIS — I129 Hypertensive chronic kidney disease with stage 1 through stage 4 chronic kidney disease, or unspecified chronic kidney disease: Secondary | ICD-10-CM | POA: Diagnosis not present

## 2019-12-23 DIAGNOSIS — Z7902 Long term (current) use of antithrombotics/antiplatelets: Secondary | ICD-10-CM | POA: Diagnosis not present

## 2019-12-23 DIAGNOSIS — M8580 Other specified disorders of bone density and structure, unspecified site: Secondary | ICD-10-CM | POA: Insufficient documentation

## 2019-12-23 DIAGNOSIS — I10 Essential (primary) hypertension: Secondary | ICD-10-CM | POA: Diagnosis not present

## 2019-12-23 DIAGNOSIS — N179 Acute kidney failure, unspecified: Secondary | ICD-10-CM

## 2019-12-23 DIAGNOSIS — E785 Hyperlipidemia, unspecified: Secondary | ICD-10-CM | POA: Insufficient documentation

## 2019-12-23 LAB — CBC WITH DIFFERENTIAL/PLATELET
Abs Immature Granulocytes: 0.02 10*3/uL (ref 0.00–0.07)
Basophils Absolute: 0 10*3/uL (ref 0.0–0.1)
Basophils Relative: 0 %
Eosinophils Absolute: 0 10*3/uL (ref 0.0–0.5)
Eosinophils Relative: 0 %
HCT: 35.5 % — ABNORMAL LOW (ref 39.0–52.0)
Hemoglobin: 11.7 g/dL — ABNORMAL LOW (ref 13.0–17.0)
Immature Granulocytes: 0 %
Lymphocytes Relative: 11 %
Lymphs Abs: 0.5 10*3/uL — ABNORMAL LOW (ref 0.7–4.0)
MCH: 33.3 pg (ref 26.0–34.0)
MCHC: 33 g/dL (ref 30.0–36.0)
MCV: 101.1 fL — ABNORMAL HIGH (ref 80.0–100.0)
Monocytes Absolute: 0.3 10*3/uL (ref 0.1–1.0)
Monocytes Relative: 8 %
Neutro Abs: 3.6 10*3/uL (ref 1.7–7.7)
Neutrophils Relative %: 81 %
Platelets: 60 10*3/uL — ABNORMAL LOW (ref 150–400)
RBC: 3.51 MIL/uL — ABNORMAL LOW (ref 4.22–5.81)
RDW: 13.3 % (ref 11.5–15.5)
WBC: 4.5 10*3/uL (ref 4.0–10.5)
nRBC: 0 % (ref 0.0–0.2)

## 2019-12-23 LAB — COMPREHENSIVE METABOLIC PANEL
ALT: 23 U/L (ref 0–44)
AST: 26 U/L (ref 15–41)
Albumin: 4.5 g/dL (ref 3.5–5.0)
Alkaline Phosphatase: 63 U/L (ref 38–126)
Anion gap: 9 (ref 5–15)
BUN: 25 mg/dL — ABNORMAL HIGH (ref 8–23)
CO2: 25 mmol/L (ref 22–32)
Calcium: 9.3 mg/dL (ref 8.9–10.3)
Chloride: 106 mmol/L (ref 98–111)
Creatinine, Ser: 1.44 mg/dL — ABNORMAL HIGH (ref 0.61–1.24)
GFR calc Af Amer: 59 mL/min — ABNORMAL LOW (ref >60)
GFR calc non Af Amer: 51 mL/min — ABNORMAL LOW (ref >60)
Glucose, Bld: 98 mg/dL (ref 70–99)
Potassium: 4 mmol/L (ref 3.5–5.1)
Sodium: 140 mmol/L (ref 135–145)
Total Bilirubin: 0.7 mg/dL (ref 0.3–1.2)
Total Protein: 7.2 g/dL (ref 6.5–8.1)

## 2019-12-23 MED ORDER — DENOSUMAB 120 MG/1.7ML ~~LOC~~ SOLN
120.0000 mg | Freq: Once | SUBCUTANEOUS | Status: AC
  Administered 2019-12-23: 120 mg via SUBCUTANEOUS
  Filled 2019-12-23: qty 1.7

## 2019-12-23 NOTE — Progress Notes (Signed)
Patient here for follow up. Patient reports bone aching.

## 2019-12-24 LAB — KAPPA/LAMBDA LIGHT CHAINS
Kappa free light chain: 41 mg/L — ABNORMAL HIGH (ref 3.3–19.4)
Kappa, lambda light chain ratio: 2.37 — ABNORMAL HIGH (ref 0.26–1.65)
Lambda free light chains: 17.3 mg/L (ref 5.7–26.3)

## 2019-12-24 NOTE — Progress Notes (Signed)
Hematology/Oncology  Follow up note [facility] [phone] [phone]   Patient Care Team: Tonia Ghent, MD as PCP - General (Family Medicine)  REFERRING PROVIDER: Napoleon Form REASON FOR VISIT Follow up for multiple myeloma.   HISTORY OF PRESENTING ILLNESS:  Luis Burke is a  64 y.o.  male with PMH listed below who was referred to me for evaluation of anemia and thrombocytopenia Associated signs and symptoms: Patient reports feeling fatigue.  Denies SOB with exertion.  Denies weight loss, easy bruising, hematochezia, hemoptysis, hematuria. Context: History of GI bleeding: Denies               History of Chronic kidney disease yes               History of autoimmune disease denies denies               History of hemolytic anemia.                Last colonoscopy: 07/20/2018 upper and lower endoscopy showed esophagitis, normal examined duodenum, 2 polyps removed in the ascending colon, resected and retrieved.  Otherwise normal. He drinks alcohol, usually a few beers during the weekends.  Denies any smoking history.   Intended weight loss 3 pounds for the past 3 months.  # 07/27/2018 multiple myeloma panel showed M protein of 0.1, IgG 662, IgA 49, IgM 7. Patient was called back to further labs done. 08/10/2018, free light chain ratio showed extremely high level of kappa free light chain 10,183, with a kappa lambda light chain ratio of 1414.31 LDH 164 Beta-2 microglobulin 5 Patient was called and discuss about results.  He was recommended to undergo bone marrow biopsy and PET scan. 08/19/2018 bone marrow biopsy showed hypercellular marrow 80%, involved by plasma cell neoplasm up to 95%.  Consistent with plasma cell myeloma.  FISH and cytogenetics are pending.  09/10/2018 skeletal survey showed questionable small lucent lesions within the midshaft of the humerus bilaterally.  Otherwise no suspicious focal lytic lesion or acute bone  abnormality. 08/25/2018 PET scan showed no definite hypermetabolic bone disease but CT findings are highly suspicious for numerous small myelomatous lesions involving the spine, sternum and scattered ribs.  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 He received preparative regimen with melphalan 200 mg/m on 04/22/2019 followed by autologous stem cell infusion on 04/23/2019. Currently he is transfusion independent.  Transfusion criteria with as needed hemoglobin less than 7.5 for platelet less than 10,000.  Transplant course was complicated with febrile neutropenia grade 3, treated with vancomycin and cephapirin until engraftment on 05/06/2019. Patient also had engraftment syndrome and had to be started on Solu-Medrol 25 mg twice daily on 05/05/2019.  Transition to Medrol Dosepak on 05/07/2019 to complete steroid taper as outpatient.  Patient is currently on acyclovir for 1 year after transplant, dose was reduced on 722 11/20/2018 due to renal function. Patient had a baseline CKD with creatinine running between 1.4-1.6.  He had acute on chronic kidney injury and creatinine went up to 2.2 on 722.  Fluid hydration was given and creatinine decreased to baseline 1.5-1.7. His Hickman catheter was removed.  Candida Cruris treated with topical clotrimazole 1% 3 times daily and to resolution..  #  seen by Dr. Tonia Brooms BMT team on 06/30/2019.revaccination at [place] begins at 12 months post transplant I discussed with [facility] hematology Dr.Choi and he recommends plans as listed below.  -patient to be started on Ixazomib maintenance: -Ixazomib  71m on D1/D8/D15 out of 28 day cycle.  -patient will start re-vaccination 12 months post transplant.  -Post transplant bone marrow biopsy if clinically indicated.   INTERVAL HISTORY GROLDAN LAFORESTis a 64y.o. 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 #Patient is currently on ixizomab  3 mg on day 1, day 8, day 15 out of 28 days. Today is day 15. He reports tolerating well. No new complaints. Recently had COVID-19 vaccination.   Review of Systems  Constitutional: Negative for appetite change, chills, fatigue, fever and unexpected weight change.  HENT:   Negative for hearing loss and voice change.   Eyes: Negative for eye problems and icterus.  Respiratory: Negative for chest tightness, cough and shortness of breath.   Cardiovascular: Negative for chest pain and leg swelling.  Gastrointestinal: Negative for abdominal distention and abdominal pain.  Endocrine: Negative for hot flashes.  Genitourinary: Negative for difficulty urinating, dysuria and frequency.   Musculoskeletal: Negative for arthralgias.  Skin: Negative for itching and rash.  Neurological: Negative for light-headedness and numbness.  Hematological: Negative for adenopathy. Does not bruise/bleed easily.  Psychiatric/Behavioral: Negative for confusion.    MEDICAL HISTORY:  Past Medical History:  Diagnosis Date  . AKI (acute kidney injury) ([place] 01/06/2019  . Anemia   . 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[place]    autologous stem cell bone marrow transplant on 04/23/2019.  .Marland KitchenFatty liver    on u/s 07/2018  . Hyperlipidemia 03/2004  . Hypertension 1994  . Snores     SURGICAL HISTORY: Past Surgical History:  Procedure Laterality Date  . CATARACT EXTRACTION W/PHACO Right 03/04/2018   Procedure: CATARACT EXTRACTION PHACO AND INTRAOCULAR LENS PLACEMENT ([place]  RIGHT TORIC;  Surgeon: BLeandrew Koyanagi MD;  [place]  Service: Ophthalmology;  Laterality: Right;  [place] no Toric Lens 1:45 5.3  . COLONOSCOPY WITH PROPOFOL N/A 07/20/2018   Procedure: COLONOSCOPY WITH PROPOFOL;  Surgeon: AJonathon Bellows MD;  [facility];  Service: Gastroenterology;  Laterality: N/A;  . ESOPHAGOGASTRODUODENOSCOPY (EGD) WITH PROPOFOL N/A 07/20/2018   Procedure: ESOPHAGOGASTRODUODENOSCOPY (EGD) WITH  PROPOFOL;  Surgeon: AJonathon Bellows MD;  [facility];  Service: Gastroenterology;  Laterality: N/A;  . EYE SURGERY Right   . HERNIA REPAIR    . L Lap herniorraphy  04/2000  . Left wrist ganglionectomy      SOCIAL HISTORY: Social History   Socioeconomic History  . Marital status: Single    Spouse name: Not on file  . Number of children: 1  . Years of education: Not on file  . Highest education level: Not on file  Occupational History  . Occupation: [employer]  Tobacco Use  . Smoking status: Never Smoker  . Smokeless tobacco: Never Used  . Tobacco comment: Occassionally  Substance and Sexual Activity  . Alcohol use: Not Currently    Alcohol/week: 3.0 standard drinks    Types: 3 Cans of beer per week  . Drug use: No  . Sexual activity: Not on file  Other Topics Concern  . Not on file  Social History Narrative   Divorced 22009/02/06 dating as of 202-03-2018  His daughter died 4 days after giving birth to the patient's granddaughter   Has joint custody of his dead daughter's child   Works at [employer]   . Difficulty of Paying Living Expenses:  Food Insecurity:   . Worried About Charity fundraiser in the Last Year:   . Arboriculturist in the Last Year:   Transportation Needs:   . Film/video editor (Medical):   Marland Kitchen Lack of Transportation (Non-Medical):   Physical Activity:   . Days of Exercise per Week:   . Minutes of Exercise per Session:   Stress:   . Feeling of Stress :   Social Connections:   . Frequency of Communication with Friends and Family:   . Frequency of Social Gatherings with Friends and Family:   . Attends Religious Services:   . Active Member of Clubs or Organizations:   . Attends Archivist Meetings:   Marland Kitchen Marital Status:   Intimate Partner Violence:   . Fear of Current or Ex-Partner:   . Emotionally Abused:   Marland Kitchen Physically Abused:   . Sexually Abused:      FAMILY HISTORY: Family History  Problem Relation Age of Onset  . Hypertension Mother   . Diabetes Mother   . Heart disease Mother        CAD  . Dementia Mother   . Hypertension Father   . Heart disease Father        MI 02/03  . Colon cancer Father   . Hypertension Sister   . Hypertension Sister   . Hypertension Sister   . Prostate cancer Neg Hx     ALLERGIES:  is allergic to bactrim [sulfamethoxazole-trimethoprim]; clonidine derivatives; and tadalafil.  MEDICATIONS:  Current Outpatient Medications  Medication Sig Dispense Refill  . acetaminophen (TYLENOL) 325 MG tablet Take 650 mg by mouth every 6 (six) hours as needed.    Marland Kitchen acyclovir (ZOVIRAX) 400 MG tablet Take 1 tablet (400 mg total) by mouth 2 (two) times daily. 60 tablet 11  . amLODipine (NORVASC) 5 MG tablet TAKE 1-2 TABLETS (5-10 MG TOTAL) BY MOUTH DAILY. 180 tablet 3  . clopidogrel (PLAVIX) 75 MG tablet Take by mouth.    . co-enzyme Q-10 50 MG capsule Take 50 mg by mouth daily.    . cyanocobalamin (CVS VITAMIN B12) 1000 MCG tablet Take 1 tablet (1,000 mcg total) by mouth daily. 90 tablet 3  . famotidine (PEPCID) 20 MG tablet Take 20 mg by mouth daily.     . hydrALAZINE (APRESOLINE) 10 MG tablet TAKE 1 TABLET BY MOUTH THREE TIMES A DAY 90 tablet 5  . ixazomib citrate (NINLARO) 3 MG capsule Take 1 capsule (3 mg) by mouth weekly, 3 weeks on, 1 week off, repeat every 4 weeks. Take on an empty stomach 1hr before or 2hr after meals. 3 capsule 3  . lisinopril (ZESTRIL) 20 MG tablet Take 1 tablet (20 mg total) by mouth daily. 90 tablet 3  . LORazepam (ATIVAN) 1 MG tablet TAKE 1 TABLET (1 MG TOTAL) BY MOUTH EVERY 6 (SIX) HOURS AS NEEDED FOR ANXIETY (FOR NAUSEA)    . magnesium chloride (SLOW-MAG) 64 MG TBEC SR tablet Take 1 tablet (64 mg total) by mouth daily. 60 tablet 0  . metoprolol succinate (TOPROL-XL) 50 MG 24 hr tablet TAKE 2-3 TABLETS (100-150 MG TOTAL) BY MOUTH DAILY. 270 tablet 3  . ondansetron (ZOFRAN) 8 MG tablet  Take 8 mg by mouth every 8 (eight) hours as needed. for nausea    . pravastatin (PRAVACHOL) 10 MG tablet Take 2 tablets (20 mg total) by mouth daily.    . prochlorperazine (COMPAZINE) 10 MG tablet TAKE 1 TABLET (10 MG TOTAL) BY MOUTH  EVERY 6 (SIX) HOURS AS NEEDED FOR NAUSEA    . zinc gluconate 50 MG tablet Take 50 mg by mouth daily.    . CVS CLOTRIMAZOLE 1 % cream APPLY TO AFFECTED AREA 3 TIMES A DAY FOR 10 DAYS    . cyclobenzaprine (FLEXERIL) 5 MG tablet Take 1 tablet (5 mg total) by mouth 3 (three) times daily as needed for muscle spasms. 30 tablet 0   No current facility-administered medications for this visit.     PHYSICAL EXAMINATION: ECOG PERFORMANCE STATUS: 1 - Symptomatic but completely ambulatory Vitals:   12/23/19 0953  BP: (!) 143/87  Pulse: 89  Temp: (!) 95.8 F (35.4 C)   Filed Weights   12/23/19 0953  Weight: 207 lb (93.9 kg)    Physical Exam Constitutional:      General: He is not in acute distress. HENT:     Head: Normocephalic and atraumatic.  Eyes:     General: No scleral icterus. Cardiovascular:     Rate and Rhythm: Normal rate and regular rhythm.     Heart sounds: Normal heart sounds.  Pulmonary:     Effort: Pulmonary effort is normal. No respiratory distress.     Breath sounds: No wheezing.  Abdominal:     General: Bowel sounds are normal. There is no distension.     Palpations: Abdomen is soft.  Musculoskeletal:        General: No deformity. Normal range of motion.     Cervical back: Normal range of motion and neck supple.  Skin:    General: Skin is warm and dry.     Findings: No erythema or rash.  Neurological:     Mental Status: He is alert and oriented to person, place, and time. Mental status is at baseline.     Cranial Nerves: No cranial nerve deficit.     Coordination: Coordination normal.  Psychiatric:        Mood and Affect: Mood normal.      LABORATORY DATA:  I have reviewed the data as listed Lab Results  Component Value Date    WBC 4.5 12/23/2019   HGB 11.7 (L) 12/23/2019   HCT 35.5 (L) 12/23/2019   MCV 101.1 (H) 12/23/2019   PLT 60 (L) 12/23/2019   Recent Labs    10/28/19 1009 11/25/19 1003 12/23/19 0936  NA 139 135 140  K 4.0 3.6 4.0  CL 104 99 106  CO2 _0 GLUCOSE 111* 104* 98  BUN 21 20 25*  CREATININE 1.23 1.47* 1.44*  CALCIUM 9.6 9.7 9.3  GFRNONAA >60 50* 51*  GFRAA >60 58* 59*  PROT 7.2 7.6 7.2  ALBUMIN 4.7 4.7 4.5  AST _1 ALT _2 ALKPHOS 66 61 63  BILITOT 0.8 0.7 0.7   Iron/TIBC/Ferritin/ %Sat    Component Value Date/Time   IRON 74 07/01/2018 0944   TIBC 250 07/01/2018 0944   FERRITIN 357 07/01/2018 0944   IRONPCTSAT 30 07/01/2018 0944    Lab Results  Component Value Date   TOTALPROTELP 6.9 11/25/2019   ALBUMINELP 3.7 03/10/2019   A1GS 0.3 03/10/2019   A2GS 0.8 03/10/2019   BETS 1.3 03/10/2019   GAMS 0.6 03/10/2019   MSPIKE Not Observed 03/10/2019   SPEI Comment 03/10/2019   Lab Results  Component Value Date   KPAFRELGTCHN 31.2 (H) 11/25/2019   LAMBDASER 14.6 11/25/2019   KAPLAMBRATIO 2.14 (H) 11/25/2019    RADIOGRAPHIC STUDIES: I have personally reviewed the radiological  images as listed and agreed with the findings in the report. 10/23/2018 FINDINGS: The heart size and mediastinal contours are within normal limits. Both lungs are clear. The visualized skeletal structures are unremarkable. IMPRESSION: No active cardiopulmonary disease.   ASSESSMENT & PLAN:  1. Multiple myeloma not having achieved remission ([place])   2. Encounter for antineoplastic chemotherapy   3. Thrombocytopenia (HCC)   4. Stage 3a chronic kidney disease   5. Macrocytic anemia    #Light chain multiple myeloma beta 2 microglobulin 5 and normal albumin.  Stage II. cytogenetics showed normal male chromosome, MDS FISH panel showed trisomy 9- Standard Risk. S/p RVD x 9 and  Autologous bone marrow stem cell transplant at [facility] on 04/23/2019. He has not had post bone marrow  transplant bone marrow biopsy yet.  Per [facility] transplant team, bone marrow biopsy will be considered if clinically indicated. Light chain ratio has decreased to 2.14, indicating good response.  Currently on ixizomab maintenance. Labs are reviewed and discussed with patient. Thrombocytopenia, platelet count dropped to 60,000.  He is asymptomatic.  No acute bleeding. Repeat CBC in 10 days.  Chronic kidney disease, creatinine level is stable. Anemia, hemoglobin 11.7, MCV increased to 101. Check vitamin B12 level.  #Post bone marrow transplant care, Continue acyclovir 400 mg twice daily  #Bone health/osteopenia/multiple myeloma Proceed with Xgeva today. Continue calcium and vitamin D supplementation  Follow up in 5 weeks.    Earlie Server, MD, PhD Hematology Oncology [place] [facility] 12/24/2019

## 2019-12-27 LAB — MULTIPLE MYELOMA PANEL, SERUM
Albumin SerPl Elph-Mcnc: 4.5 g/dL — ABNORMAL HIGH (ref 2.9–4.4)
Albumin/Glob SerPl: 2 — ABNORMAL HIGH (ref 0.7–1.7)
Alpha 1: 0.2 g/dL (ref 0.0–0.4)
Alpha2 Glob SerPl Elph-Mcnc: 0.6 g/dL (ref 0.4–1.0)
B-Globulin SerPl Elph-Mcnc: 0.9 g/dL (ref 0.7–1.3)
Gamma Glob SerPl Elph-Mcnc: 0.6 g/dL (ref 0.4–1.8)
Globulin, Total: 2.3 g/dL (ref 2.2–3.9)
IgA: 119 mg/dL (ref 61–437)
IgG (Immunoglobin G), Serum: 791 mg/dL (ref 603–1613)
IgM (Immunoglobulin M), Srm: 18 mg/dL — ABNORMAL LOW (ref 20–172)
Total Protein ELP: 6.8 g/dL (ref 6.0–8.5)

## 2020-01-03 ENCOUNTER — Inpatient Hospital Stay: Payer: BC Managed Care – PPO

## 2020-01-03 ENCOUNTER — Other Ambulatory Visit: Payer: Self-pay

## 2020-01-03 DIAGNOSIS — Z7902 Long term (current) use of antithrombotics/antiplatelets: Secondary | ICD-10-CM | POA: Diagnosis not present

## 2020-01-03 DIAGNOSIS — D696 Thrombocytopenia, unspecified: Secondary | ICD-10-CM

## 2020-01-03 DIAGNOSIS — I10 Essential (primary) hypertension: Secondary | ICD-10-CM | POA: Diagnosis not present

## 2020-01-03 DIAGNOSIS — I129 Hypertensive chronic kidney disease with stage 1 through stage 4 chronic kidney disease, or unspecified chronic kidney disease: Secondary | ICD-10-CM | POA: Diagnosis not present

## 2020-01-03 DIAGNOSIS — M8580 Other specified disorders of bone density and structure, unspecified site: Secondary | ICD-10-CM | POA: Diagnosis not present

## 2020-01-03 DIAGNOSIS — K760 Fatty (change of) liver, not elsewhere classified: Secondary | ICD-10-CM | POA: Diagnosis not present

## 2020-01-03 DIAGNOSIS — Z79899 Other long term (current) drug therapy: Secondary | ICD-10-CM | POA: Diagnosis not present

## 2020-01-03 DIAGNOSIS — C9000 Multiple myeloma not having achieved remission: Secondary | ICD-10-CM | POA: Diagnosis not present

## 2020-01-03 DIAGNOSIS — N1831 Chronic kidney disease, stage 3a: Secondary | ICD-10-CM | POA: Diagnosis not present

## 2020-01-03 DIAGNOSIS — E785 Hyperlipidemia, unspecified: Secondary | ICD-10-CM | POA: Diagnosis not present

## 2020-01-03 DIAGNOSIS — Z9484 Stem cells transplant status: Secondary | ICD-10-CM | POA: Diagnosis not present

## 2020-01-03 DIAGNOSIS — D539 Nutritional anemia, unspecified: Secondary | ICD-10-CM | POA: Diagnosis not present

## 2020-01-03 DIAGNOSIS — R5383 Other fatigue: Secondary | ICD-10-CM | POA: Diagnosis not present

## 2020-01-03 LAB — CBC WITH DIFFERENTIAL/PLATELET
Abs Immature Granulocytes: 0.06 10*3/uL (ref 0.00–0.07)
Basophils Absolute: 0 10*3/uL (ref 0.0–0.1)
Basophils Relative: 0 %
Eosinophils Absolute: 0.1 10*3/uL (ref 0.0–0.5)
Eosinophils Relative: 1 %
HCT: 35 % — ABNORMAL LOW (ref 39.0–52.0)
Hemoglobin: 12 g/dL — ABNORMAL LOW (ref 13.0–17.0)
Immature Granulocytes: 1 %
Lymphocytes Relative: 28 %
Lymphs Abs: 1.7 10*3/uL (ref 0.7–4.0)
MCH: 33.9 pg (ref 26.0–34.0)
MCHC: 34.3 g/dL (ref 30.0–36.0)
MCV: 98.9 fL (ref 80.0–100.0)
Monocytes Absolute: 0.6 10*3/uL (ref 0.1–1.0)
Monocytes Relative: 10 %
Neutro Abs: 3.7 10*3/uL (ref 1.7–7.7)
Neutrophils Relative %: 60 %
Platelets: 83 10*3/uL — ABNORMAL LOW (ref 150–400)
RBC: 3.54 MIL/uL — ABNORMAL LOW (ref 4.22–5.81)
RDW: 13.3 % (ref 11.5–15.5)
WBC: 6.2 10*3/uL (ref 4.0–10.5)
nRBC: 0 % (ref 0.0–0.2)

## 2020-01-17 ENCOUNTER — Other Ambulatory Visit: Payer: BC Managed Care – PPO

## 2020-01-17 ENCOUNTER — Ambulatory Visit: Payer: BC Managed Care – PPO

## 2020-01-17 ENCOUNTER — Ambulatory Visit: Payer: BC Managed Care – PPO | Admitting: Oncology

## 2020-01-18 ENCOUNTER — Encounter: Payer: Self-pay | Admitting: Oncology

## 2020-01-27 ENCOUNTER — Inpatient Hospital Stay: Payer: BC Managed Care – PPO

## 2020-01-27 ENCOUNTER — Inpatient Hospital Stay (HOSPITAL_BASED_OUTPATIENT_CLINIC_OR_DEPARTMENT_OTHER): Payer: BC Managed Care – PPO | Admitting: Oncology

## 2020-01-27 ENCOUNTER — Encounter: Payer: Self-pay | Admitting: Oncology

## 2020-01-27 ENCOUNTER — Inpatient Hospital Stay: Payer: BC Managed Care – PPO | Attending: Oncology

## 2020-01-27 ENCOUNTER — Other Ambulatory Visit: Payer: Self-pay

## 2020-01-27 VITALS — BP 150/91 | HR 67 | Temp 96.8°F | Resp 18 | Wt 206.8 lb

## 2020-01-27 DIAGNOSIS — D649 Anemia, unspecified: Secondary | ICD-10-CM | POA: Insufficient documentation

## 2020-01-27 DIAGNOSIS — I10 Essential (primary) hypertension: Secondary | ICD-10-CM | POA: Insufficient documentation

## 2020-01-27 DIAGNOSIS — Z79899 Other long term (current) drug therapy: Secondary | ICD-10-CM | POA: Insufficient documentation

## 2020-01-27 DIAGNOSIS — M7989 Other specified soft tissue disorders: Secondary | ICD-10-CM | POA: Insufficient documentation

## 2020-01-27 DIAGNOSIS — D539 Nutritional anemia, unspecified: Secondary | ICD-10-CM

## 2020-01-27 DIAGNOSIS — Z8249 Family history of ischemic heart disease and other diseases of the circulatory system: Secondary | ICD-10-CM | POA: Diagnosis not present

## 2020-01-27 DIAGNOSIS — Z9481 Bone marrow transplant status: Secondary | ICD-10-CM | POA: Insufficient documentation

## 2020-01-27 DIAGNOSIS — Z833 Family history of diabetes mellitus: Secondary | ICD-10-CM | POA: Diagnosis not present

## 2020-01-27 DIAGNOSIS — N179 Acute kidney failure, unspecified: Secondary | ICD-10-CM

## 2020-01-27 DIAGNOSIS — C9000 Multiple myeloma not having achieved remission: Secondary | ICD-10-CM | POA: Insufficient documentation

## 2020-01-27 DIAGNOSIS — Z800 Family history of malignant neoplasm of digestive organs: Secondary | ICD-10-CM | POA: Insufficient documentation

## 2020-01-27 DIAGNOSIS — E785 Hyperlipidemia, unspecified: Secondary | ICD-10-CM | POA: Insufficient documentation

## 2020-01-27 DIAGNOSIS — N1831 Chronic kidney disease, stage 3a: Secondary | ICD-10-CM | POA: Insufficient documentation

## 2020-01-27 DIAGNOSIS — M8580 Other specified disorders of bone density and structure, unspecified site: Secondary | ICD-10-CM | POA: Insufficient documentation

## 2020-01-27 LAB — CBC WITH DIFFERENTIAL/PLATELET
Abs Immature Granulocytes: 0.03 10*3/uL (ref 0.00–0.07)
Basophils Absolute: 0 10*3/uL (ref 0.0–0.1)
Basophils Relative: 0 %
Eosinophils Absolute: 0 10*3/uL (ref 0.0–0.5)
Eosinophils Relative: 1 %
HCT: 36.3 % — ABNORMAL LOW (ref 39.0–52.0)
Hemoglobin: 12.4 g/dL — ABNORMAL LOW (ref 13.0–17.0)
Immature Granulocytes: 1 %
Lymphocytes Relative: 23 %
Lymphs Abs: 1.3 10*3/uL (ref 0.7–4.0)
MCH: 33.7 pg (ref 26.0–34.0)
MCHC: 34.2 g/dL (ref 30.0–36.0)
MCV: 98.6 fL (ref 80.0–100.0)
Monocytes Absolute: 0.5 10*3/uL (ref 0.1–1.0)
Monocytes Relative: 9 %
Neutro Abs: 3.7 10*3/uL (ref 1.7–7.7)
Neutrophils Relative %: 66 %
Platelets: 67 10*3/uL — ABNORMAL LOW (ref 150–400)
RBC: 3.68 MIL/uL — ABNORMAL LOW (ref 4.22–5.81)
RDW: 13.3 % (ref 11.5–15.5)
WBC: 5.5 10*3/uL (ref 4.0–10.5)
nRBC: 0 % (ref 0.0–0.2)

## 2020-01-27 LAB — COMPREHENSIVE METABOLIC PANEL
ALT: 24 U/L (ref 0–44)
AST: 22 U/L (ref 15–41)
Albumin: 4.7 g/dL (ref 3.5–5.0)
Alkaline Phosphatase: 72 U/L (ref 38–126)
Anion gap: 9 (ref 5–15)
BUN: 22 mg/dL (ref 8–23)
CO2: 26 mmol/L (ref 22–32)
Calcium: 9.6 mg/dL (ref 8.9–10.3)
Chloride: 104 mmol/L (ref 98–111)
Creatinine, Ser: 1.38 mg/dL — ABNORMAL HIGH (ref 0.61–1.24)
GFR calc Af Amer: >60 mL/min (ref >60)
GFR calc non Af Amer: 54 mL/min — ABNORMAL LOW (ref >60)
Glucose, Bld: 104 mg/dL — ABNORMAL HIGH (ref 70–99)
Potassium: 4.1 mmol/L (ref 3.5–5.1)
Sodium: 139 mmol/L (ref 135–145)
Total Bilirubin: 0.8 mg/dL (ref 0.3–1.2)
Total Protein: 7.4 g/dL (ref 6.5–8.1)

## 2020-01-27 LAB — VITAMIN B12: Vitamin B-12: 778 pg/mL (ref 180–914)

## 2020-01-27 MED ORDER — DENOSUMAB 120 MG/1.7ML ~~LOC~~ SOLN
120.0000 mg | Freq: Once | SUBCUTANEOUS | Status: AC
  Administered 2020-01-27: 120 mg via SUBCUTANEOUS
  Filled 2020-01-27: qty 1.7

## 2020-01-27 NOTE — Progress Notes (Signed)
Patient here for follow up. Reports continued swelling to both legs and neck pain 3/10.

## 2020-01-27 NOTE — Progress Notes (Signed)
Hematology/Oncology  Follow up note [facility] [phone] [phone]   Patient Care Team: Tonia Ghent, MD as PCP - General (Family Medicine)  REFERRING PROVIDER: Napoleon Form REASON FOR VISIT Follow up for multiple myeloma.   HISTORY OF PRESENTING ILLNESS:  Luis Burke is a  64 y.o.  male with PMH listed below who was referred to me for evaluation of anemia and thrombocytopenia Associated signs and symptoms: Patient reports feeling fatigue.  Denies SOB with exertion.  Denies weight loss, easy bruising, hematochezia, hemoptysis, hematuria. Context: History of GI bleeding: Denies               History of Chronic kidney disease yes               History of autoimmune disease denies denies               History of hemolytic anemia.                Last colonoscopy: 07/20/2018 upper and lower endoscopy showed esophagitis, normal examined duodenum, 2 polyps removed in the ascending colon, resected and retrieved.  Otherwise normal. He drinks alcohol, usually a few beers during the weekends.  Denies any smoking history.   Intended weight loss 3 pounds for the past 3 months.  # 07/27/2018 multiple myeloma panel showed M protein of 0.1, IgG 662, IgA 49, IgM 7. Patient was called back to further labs done. 08/10/2018, free light chain ratio showed extremely high level of kappa free light chain 10,183, with a kappa lambda light chain ratio of 1414.31 LDH 164 Beta-2 microglobulin 5 Patient was called and discuss about results.  He was recommended to undergo bone marrow biopsy and PET scan. 08/19/2018 bone marrow biopsy showed hypercellular marrow 80%, involved by plasma cell neoplasm up to 95%.  Consistent with plasma cell myeloma.  FISH and cytogenetics are pending.  09/10/2018 skeletal survey showed questionable small lucent lesions within the midshaft of the humerus bilaterally.  Otherwise no suspicious focal lytic lesion or acute bone  abnormality. 08/25/2018 PET scan showed no definite hypermetabolic bone disease but CT findings are highly suspicious for numerous small myelomatous lesions involving the spine, sternum and scattered ribs.  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 He received preparative regimen with melphalan 200 mg/m on 04/22/2019 followed by autologous stem cell infusion on 04/23/2019. Currently he is transfusion independent.  Transfusion criteria with as needed hemoglobin less than 7.5 for platelet less than 10,000.  Transplant course was complicated with febrile neutropenia grade 3, treated with vancomycin and cephapirin until engraftment on 05/06/2019. Patient also had engraftment syndrome and had to be started on Solu-Medrol 25 mg twice daily on 05/05/2019.  Transition to Medrol Dosepak on 05/07/2019 to complete steroid taper as outpatient.  Patient is currently on acyclovir for 1 year after transplant, dose was reduced on 722 11/20/2018 due to renal function. Patient had a baseline CKD with creatinine running between 1.4-1.6.  He had acute on chronic kidney injury and creatinine went up to 2.2 on 722.  Fluid hydration was given and creatinine decreased to baseline 1.5-1.7. His Hickman catheter was removed.  Candida Cruris treated with topical clotrimazole 1% 3 times daily and to resolution..  #  seen by Dr. Tonia Brooms BMT team on 06/30/2019.revaccination at [place] begins at 12 months post transplant I discussed with [facility] hematology Dr.Choi and he recommends plans as listed below.  -patient to be started on Ixazomib maintenance: -Ixazomib  $'3mg's$  on D1/D8/D15 out of 28 day cycle.  -patient will start re-vaccination 12 months post transplant.  -Post transplant bone marrow biopsy if clinically indicated.   INTERVAL HISTORY Luis Burke is a 64 y.o. 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 #Patient is currently on ixizomab  3 mg on day 1, day 8, day 15 out of 28 days. He continues to have bilateral lower extremity swelling, worst at the end of the day. Chronic problem. .   Review of Systems  Constitutional: Negative for appetite change, chills, fatigue, fever and unexpected weight change.  HENT:   Negative for hearing loss and voice change.   Eyes: Negative for eye problems and icterus.  Respiratory: Negative for chest tightness, cough and shortness of breath.   Cardiovascular: Negative for chest pain and leg swelling.  Gastrointestinal: Negative for abdominal distention and abdominal pain.  Endocrine: Negative for hot flashes.  Genitourinary: Negative for difficulty urinating, dysuria and frequency.   Musculoskeletal: Negative for arthralgias.  Skin: Negative for itching and rash.  Neurological: Negative for light-headedness and numbness.  Hematological: Negative for adenopathy. Does not bruise/bleed easily.  Psychiatric/Behavioral: Negative for confusion.    MEDICAL HISTORY:  Past Medical History:  Diagnosis Date  . AKI (acute kidney injury) ([place]) 01/06/2019  . Anemia   . 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[place])    autologous stem cell bone marrow transplant on 04/23/2019.  Marland Kitchen Fatty liver    on u/s 07/2018  . Hyperlipidemia 03/2004  . Hypertension 1994  . Snores     SURGICAL HISTORY: Past Surgical History:  Procedure Laterality Date  . CATARACT EXTRACTION W/PHACO Right 03/04/2018   Procedure: CATARACT EXTRACTION PHACO AND INTRAOCULAR LENS PLACEMENT ([place])  RIGHT TORIC;  Surgeon: Leandrew Koyanagi, MD;  Location: [place];  Service: Ophthalmology;  Laterality: Right;  Per [place] no Toric Lens 1:45 5.3  . COLONOSCOPY WITH PROPOFOL N/A 07/20/2018   Procedure: COLONOSCOPY WITH PROPOFOL;  Surgeon: Jonathon Bellows, MD;  Location[facility] ENDOSCOPY;  Service: Gastroenterology;  Laterality: N/A;  . ESOPHAGOGASTRODUODENOSCOPY (EGD) WITH PROPOFOL N/A 07/20/2018   Procedure: ESOPHAGOGASTRODUODENOSCOPY (EGD)  WITH PROPOFOL;  Surgeon: Jonathon Bellows, MD;  Location[facility] ENDOSCOPY;  Service: Gastroenterology;  Laterality: N/A;  . EYE SURGERY Right   . HERNIA REPAIR    . L Lap herniorraphy  04/2000  . Left wrist ganglionectomy      SOCIAL HISTORY: Social History   Socioeconomic History  . Marital status: Single    Spouse name: Not on file  . Number of children: 1  . Years of education: Not on file  . Highest education level: Not on file  Occupational History  . Occupation: [employer]  Tobacco Use  . Smoking status: Never Smoker  . Smokeless tobacco: Never Used  . Tobacco comment: Occassionally  Substance and Sexual Activity  . Alcohol use: Not Currently    Alcohol/week: 3.0 standard drinks    Types: 3 Cans of beer per week  . Drug use: No  . Sexual activity: Not on file  Other Topics Concern  . Not on file  Social History Narrative   Divorced 05-Jan-2008, dating as of 01/04/18   His daughter died 4 days after giving birth to the patient's granddaughter   Has joint custody of his dead daughter's child   Works at [employer] is [employer]:   . Difficulty of Paying  Living Expenses:   Food Insecurity:   . Worried About Charity fundraiser in the Last Year:   . Arboriculturist in the Last Year:   Transportation Needs:   . Film/video editor (Medical):   Marland Kitchen Lack of Transportation (Non-Medical):   Physical Activity:   . Days of Exercise per Week:   . Minutes of Exercise per Session:   Stress:   . Feeling of Stress :   Social Connections:   . Frequency of Communication with Friends and Family:   . Frequency of Social Gatherings with Friends and Family:   . Attends Religious Services:   . Active Member of Clubs or Organizations:   . Attends Archivist Meetings:   Marland Kitchen Marital Status:   Intimate Partner Violence:   . Fear of Current or Ex-Partner:   . Emotionally Abused:   Marland Kitchen Physically Abused:   . Sexually  Abused:     FAMILY HISTORY: Family History  Problem Relation Age of Onset  . Hypertension Mother   . Diabetes Mother   . Heart disease Mother        CAD  . Dementia Mother   . Hypertension Father   . Heart disease Father        MI 02/03  . Colon cancer Father   . Hypertension Sister   . Hypertension Sister   . Hypertension Sister   . Prostate cancer Neg Hx     ALLERGIES:  is allergic to bactrim [sulfamethoxazole-trimethoprim]; clonidine derivatives; and tadalafil.  MEDICATIONS:  Current Outpatient Medications  Medication Sig Dispense Refill  . acetaminophen (TYLENOL) 325 MG tablet Take 650 mg by mouth every 6 (six) hours as needed.    Marland Kitchen acyclovir (ZOVIRAX) 400 MG tablet Take 1 tablet (400 mg total) by mouth 2 (two) times daily. 60 tablet 11  . amLODipine (NORVASC) 5 MG tablet TAKE 1-2 TABLETS (5-10 MG TOTAL) BY MOUTH DAILY. 180 tablet 3  . clopidogrel (PLAVIX) 75 MG tablet Take by mouth.    . co-enzyme Q-10 50 MG capsule Take 50 mg by mouth daily.    . cyanocobalamin ([employer]S VITAMIN B12) 1000 MCG tablet Take 1 tablet (1,000 mcg total) by mouth daily. 90 tablet 3  . cyclobenzaprine (FLEXERIL) 5 MG tablet Take 1 tablet (5 mg total) by mouth 3 (three) times daily as needed for muscle spasms. 30 tablet 0  . famotidine (PEPCID) 20 MG tablet Take 20 mg by mouth daily.     . hydrALAZINE (APRESOLINE) 10 MG tablet TAKE 1 TABLET BY MOUTH THREE TIMES A DAY 90 tablet 5  . ixazomib citrate (NINLARO) 3 MG capsule Take 1 capsule (3 mg) by mouth weekly, 3 weeks on, 1 week off, repeat every 4 weeks. Take on an empty stomach 1hr before or 2hr after meals. 3 capsule 3  . lisinopril (ZESTRIL) 20 MG tablet Take 1 tablet (20 mg total) by mouth daily. 90 tablet 3  . LORazepam (ATIVAN) 1 MG tablet TAKE 1 TABLET (1 MG TOTAL) BY MOUTH EVERY 6 (SIX) HOURS AS NEEDED FOR ANXIETY (FOR NAUSEA)    . magnesium chloride (SLOW-MAG) 64 MG TBEC SR tablet Take 1 tablet (64 mg total) by mouth daily. 60 tablet 0  .  metoprolol succinate (TOPROL-XL) 50 MG 24 hr tablet TAKE 2-3 TABLETS (100-150 MG TOTAL) BY MOUTH DAILY. 270 tablet 3  . ondansetron (ZOFRAN) 8 MG tablet Take 8 mg by mouth every 8 (eight) hours as needed. for nausea    .  pravastatin (PRAVACHOL) 10 MG tablet Take 2 tablets (20 mg total) by mouth daily.    . prochlorperazine (COMPAZINE) 10 MG tablet TAKE 1 TABLET (10 MG TOTAL) BY MOUTH EVERY 6 (SIX) HOURS AS NEEDED FOR NAUSEA    . zinc gluconate 50 MG tablet Take 50 mg by mouth daily.    . CVS CLOTRIMAZOLE 1 % cream APPLY TO AFFECTED AREA 3 TIMES A DAY FOR 10 DAYS     No current facility-administered medications for this visit.     PHYSICAL EXAMINATION: ECOG PERFORMANCE STATUS: 1 - Symptomatic but completely ambulatory Vitals:   01/27/20 1015  BP: (!) 150/91  Pulse: 67  Resp: 18  Temp: (!) 96.8 F (36 C)   Filed Weights   01/27/20 1015  Weight: 206 lb 12.8 oz (93.8 kg)    Physical Exam Constitutional:      General: He is not in acute distress. HENT:     Head: Normocephalic and atraumatic.  Eyes:     General: No scleral icterus. Cardiovascular:     Rate and Rhythm: Normal rate and regular rhythm.     Heart sounds: Normal heart sounds.  Pulmonary:     Effort: Pulmonary effort is normal. No respiratory distress.     Breath sounds: No wheezing.  Abdominal:     General: Bowel sounds are normal. There is no distension.     Palpations: Abdomen is soft.  Musculoskeletal:        General: No deformity. Normal range of motion.     Cervical back: Normal range of motion and neck supple.  Skin:    General: Skin is warm and dry.     Findings: No erythema or rash.  Neurological:     Mental Status: He is alert and oriented to person, place, and time. Mental status is at baseline.     Cranial Nerves: No cranial nerve deficit.     Coordination: Coordination normal.  Psychiatric:        Mood and Affect: Mood normal.      LABORATORY DATA:  I have reviewed the data as listed Lab  Results  Component Value Date   WBC 5.5 01/27/2020   HGB 12.4 (L) 01/27/2020   HCT 36.3 (L) 01/27/2020   MCV 98.6 01/27/2020   PLT 67 (L) 01/27/2020   Recent Labs    11/25/19 1003 12/23/19 0936 01/27/20 0943  NA 135 140 139  K 3.6 4.0 4.1  CL 99 106 104  CO2 '25 25 26  '$ GLUCOSE 104* 98 104*  BUN 20 25* 22  CREATININE 1.47* 1.44* 1.38*  CALCIUM 9.7 9.3 9.6  GFRNONAA 50* 51* 54*  GFRAA 58* 59* >60  PROT 7.6 7.2 7.4  ALBUMIN 4.7 4.5 4.7  AST '25 26 22  '$ ALT '27 23 24  '$ ALKPHOS 61 63 72  BILITOT 0.7 0.7 0.8   Iron/TIBC/Ferritin/ %Sat    Component Value Date/Time   IRON 74 07/01/2018 0944   TIBC 250 07/01/2018 0944   FERRITIN 357 07/01/2018 0944   IRONPCTSAT 30 07/01/2018 0944    Lab Results  Component Value Date   TOTALPROTELP 6.8 12/23/2019   ALBUMINELP 3.7 03/10/2019   A1GS 0.3 03/10/2019   A2GS 0.8 03/10/2019   BETS 1.3 03/10/2019   GAMS 0.6 03/10/2019   MSPIKE Not Observed 03/10/2019   SPEI Comment 03/10/2019   Lab Results  Component Value Date   KPAFRELGTCHN 41.0 (H) 12/23/2019   LAMBDASER 17.3 12/23/2019   KAPLAMBRATIO 2.37 (H) 12/23/2019  RADIOGRAPHIC STUDIES: I have personally reviewed the radiological images as listed and agreed with the findings in the report. 10/23/2018 FINDINGS: The heart size and mediastinal contours are within normal limits. Both lungs are clear. The visualized skeletal structures are unremarkable. IMPRESSION: No active cardiopulmonary disease.   ASSESSMENT & PLAN:  1. Multiple myeloma not having achieved remission (HCC)    #Light chain multiple myeloma beta 2 microglobulin 5 and normal albumin.  Stage II. cytogenetics showed normal male chromosome, MDS FISH panel showed trisomy 34- Standard Risk. S/p RVD x 9 and  Autologous bone marrow stem cell transplant at [facility] on 04/23/2019. He has not had post bone marrow transplant bone marrow biopsy yet.  Per [facility] transplant team, bone marrow biopsy will be considered if clinically  indicated.   Currently on ixazomab maintenance, 3 mg on day 1, day 8, day 15 out of 28 days. Today is Day 13.  Next cycle starts 02/10/2020.  Thrombocytopenia 54,000.  Repeat CBC in 1 week.    # Chronic bilaterally lower extremity swelling, likely due to Ixazomib.   Chronic kidney disease, creatinine level is stable. Anemia, hemoglobin Hb 12.4. stable.   #Post bone marrow transplant care, Continue acyclovir 400 mg twice daily  #Bone health/osteopenia/multiple myeloma Proceed  with Xgeva today. Continued  calcium and vitamin D supplementation  Follow up in  1 week for platelet check.    Earlie Server, MD, PhD Hematology Oncology [place] [facility] 01/27/2020

## 2020-01-28 LAB — KAPPA/LAMBDA LIGHT CHAINS
Kappa free light chain: 37 mg/L — ABNORMAL HIGH (ref 3.3–19.4)
Kappa, lambda light chain ratio: 2.27 — ABNORMAL HIGH (ref 0.26–1.65)
Lambda free light chains: 16.3 mg/L (ref 5.7–26.3)

## 2020-01-31 LAB — MULTIPLE MYELOMA PANEL, SERUM
Albumin SerPl Elph-Mcnc: 4.1 g/dL (ref 2.9–4.4)
Albumin/Glob SerPl: 1.6 (ref 0.7–1.7)
Alpha 1: 0.2 g/dL (ref 0.0–0.4)
Alpha2 Glob SerPl Elph-Mcnc: 0.7 g/dL (ref 0.4–1.0)
B-Globulin SerPl Elph-Mcnc: 1 g/dL (ref 0.7–1.3)
Gamma Glob SerPl Elph-Mcnc: 0.8 g/dL (ref 0.4–1.8)
Globulin, Total: 2.7 g/dL (ref 2.2–3.9)
IgA: 130 mg/dL (ref 61–437)
IgG (Immunoglobin G), Serum: 901 mg/dL (ref 603–1613)
IgM (Immunoglobulin M), Srm: 20 mg/dL (ref 20–172)
Total Protein ELP: 6.8 g/dL (ref 6.0–8.5)

## 2020-02-02 ENCOUNTER — Inpatient Hospital Stay: Payer: BC Managed Care – PPO

## 2020-02-02 ENCOUNTER — Encounter: Payer: Self-pay | Admitting: Oncology

## 2020-02-02 ENCOUNTER — Telehealth: Payer: Self-pay

## 2020-02-02 ENCOUNTER — Other Ambulatory Visit: Payer: Self-pay

## 2020-02-02 ENCOUNTER — Inpatient Hospital Stay (HOSPITAL_BASED_OUTPATIENT_CLINIC_OR_DEPARTMENT_OTHER): Payer: BC Managed Care – PPO | Admitting: Oncology

## 2020-02-02 VITALS — BP 143/87 | HR 73 | Temp 97.3°F | Resp 18 | Wt 204.9 lb

## 2020-02-02 DIAGNOSIS — C9000 Multiple myeloma not having achieved remission: Secondary | ICD-10-CM | POA: Diagnosis not present

## 2020-02-02 DIAGNOSIS — M7989 Other specified soft tissue disorders: Secondary | ICD-10-CM | POA: Diagnosis not present

## 2020-02-02 DIAGNOSIS — Z5111 Encounter for antineoplastic chemotherapy: Secondary | ICD-10-CM

## 2020-02-02 DIAGNOSIS — M8580 Other specified disorders of bone density and structure, unspecified site: Secondary | ICD-10-CM | POA: Diagnosis not present

## 2020-02-02 DIAGNOSIS — I10 Essential (primary) hypertension: Secondary | ICD-10-CM | POA: Diagnosis not present

## 2020-02-02 DIAGNOSIS — Z800 Family history of malignant neoplasm of digestive organs: Secondary | ICD-10-CM | POA: Diagnosis not present

## 2020-02-02 DIAGNOSIS — E785 Hyperlipidemia, unspecified: Secondary | ICD-10-CM | POA: Diagnosis not present

## 2020-02-02 DIAGNOSIS — D649 Anemia, unspecified: Secondary | ICD-10-CM | POA: Diagnosis not present

## 2020-02-02 DIAGNOSIS — D696 Thrombocytopenia, unspecified: Secondary | ICD-10-CM

## 2020-02-02 DIAGNOSIS — N1831 Chronic kidney disease, stage 3a: Secondary | ICD-10-CM | POA: Diagnosis not present

## 2020-02-02 DIAGNOSIS — Z9481 Bone marrow transplant status: Secondary | ICD-10-CM | POA: Diagnosis not present

## 2020-02-02 DIAGNOSIS — Z833 Family history of diabetes mellitus: Secondary | ICD-10-CM | POA: Diagnosis not present

## 2020-02-02 DIAGNOSIS — Z8249 Family history of ischemic heart disease and other diseases of the circulatory system: Secondary | ICD-10-CM | POA: Diagnosis not present

## 2020-02-02 DIAGNOSIS — Z79899 Other long term (current) drug therapy: Secondary | ICD-10-CM | POA: Diagnosis not present

## 2020-02-02 LAB — CBC WITH DIFFERENTIAL/PLATELET
Abs Immature Granulocytes: 0.01 10*3/uL (ref 0.00–0.07)
Basophils Absolute: 0 10*3/uL (ref 0.0–0.1)
Basophils Relative: 0 %
Eosinophils Absolute: 0 10*3/uL (ref 0.0–0.5)
Eosinophils Relative: 1 %
HCT: 36.2 % — ABNORMAL LOW (ref 39.0–52.0)
Hemoglobin: 12.2 g/dL — ABNORMAL LOW (ref 13.0–17.0)
Immature Granulocytes: 0 %
Lymphocytes Relative: 28 %
Lymphs Abs: 1.5 10*3/uL (ref 0.7–4.0)
MCH: 33.5 pg (ref 26.0–34.0)
MCHC: 33.7 g/dL (ref 30.0–36.0)
MCV: 99.5 fL (ref 80.0–100.0)
Monocytes Absolute: 0.5 10*3/uL (ref 0.1–1.0)
Monocytes Relative: 10 %
Neutro Abs: 3.3 10*3/uL (ref 1.7–7.7)
Neutrophils Relative %: 61 %
Platelets: 99 10*3/uL — ABNORMAL LOW (ref 150–400)
RBC: 3.64 MIL/uL — ABNORMAL LOW (ref 4.22–5.81)
RDW: 13.2 % (ref 11.5–15.5)
WBC: 5.4 10*3/uL (ref 4.0–10.5)
nRBC: 0 % (ref 0.0–0.2)

## 2020-02-02 LAB — COMPREHENSIVE METABOLIC PANEL
ALT: 22 U/L (ref 0–44)
AST: 21 U/L (ref 15–41)
Albumin: 4.4 g/dL (ref 3.5–5.0)
Alkaline Phosphatase: 68 U/L (ref 38–126)
Anion gap: 9 (ref 5–15)
BUN: 24 mg/dL — ABNORMAL HIGH (ref 8–23)
CO2: 26 mmol/L (ref 22–32)
Calcium: 9.6 mg/dL (ref 8.9–10.3)
Chloride: 104 mmol/L (ref 98–111)
Creatinine, Ser: 1.44 mg/dL — ABNORMAL HIGH (ref 0.61–1.24)
GFR calc Af Amer: 59 mL/min — ABNORMAL LOW (ref >60)
GFR calc non Af Amer: 51 mL/min — ABNORMAL LOW (ref >60)
Glucose, Bld: 111 mg/dL — ABNORMAL HIGH (ref 70–99)
Potassium: 4.5 mmol/L (ref 3.5–5.1)
Sodium: 139 mmol/L (ref 135–145)
Total Bilirubin: 0.7 mg/dL (ref 0.3–1.2)
Total Protein: 7.2 g/dL (ref 6.5–8.1)

## 2020-02-02 NOTE — Progress Notes (Signed)
Patient had left shoulder pain that radiated to back 2 nights ago that lasted 3 hours.

## 2020-02-02 NOTE — Progress Notes (Signed)
Hematology/Oncology  Follow up note [facility] Telephone:([phone]) [phone] Fax:([phone]   Patient Care Team: Tonia Ghent, MD as PCP - General (Family Medicine) Earlie Server, MD as Consulting Physician (Oncology) Beather Arbour Lilyan Punt, MD as Referring Physician (Hematology and Oncology) Diannia Ruder, MD as Referring Physician (Hematology and Oncology)  REFERRING PROVIDER: Dr.Anna REASON FOR VISIT Follow up for multiple myeloma.   HISTORY OF PRESENTING ILLNESS:  Luis Burke is a  64 y.o.  male with PMH listed below who was referred to me for evaluation of anemia and thrombocytopenia Associated signs and symptoms: Patient reports feeling fatigue.  Denies SOB with exertion.  Denies weight loss, easy bruising, hematochezia, hemoptysis, hematuria. Context: History of GI bleeding: Denies               History of Chronic kidney disease yes               History of autoimmune disease denies denies               History of hemolytic anemia.                Last colonoscopy: 07/20/2018 upper and lower endoscopy showed esophagitis, normal examined duodenum, 2 polyps removed in the ascending colon, resected and retrieved.  Otherwise normal. He drinks alcohol, usually a few beers during the weekends.  Denies any smoking history.   Intended weight loss 3 pounds for the past 3 months.  # 07/27/2018 multiple myeloma panel showed M protein of 0.1, IgG 662, IgA 49, IgM 7. Patient was called back to further labs done. 08/10/2018, free light chain ratio showed extremely high level of kappa free light chain 10,183, with a kappa lambda light chain ratio of 1414.31 LDH 164 Beta-2 microglobulin 5 Patient was called and discuss about results.  He was recommended to undergo bone marrow biopsy and PET scan. 08/19/2018 bone marrow biopsy showed hypercellular marrow 80%, involved by plasma cell neoplasm up to 95%.  Consistent with plasma cell myeloma.  FISH and cytogenetics are  pending.  09/10/2018 skeletal survey showed questionable small lucent lesions within the midshaft of the humerus bilaterally.  Otherwise no suspicious focal lytic lesion or acute bone abnormality. 08/25/2018 PET scan showed no definite hypermetabolic bone disease but CT findings are highly suspicious for numerous small myelomatous lesions involving the spine, sternum and scattered ribs.  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 He received preparative regimen with melphalan 200 mg/m on 04/22/2019 followed by autologous stem cell infusion on 04/23/2019. Currently he is transfusion independent.  Transfusion criteria with as needed hemoglobin less than 7.5 for platelet less than 10,000.  Transplant course was complicated with febrile neutropenia grade 3, treated with vancomycin and cephapirin until engraftment on 05/06/2019. Patient also had engraftment syndrome and had to be started on Solu-Medrol 25 mg twice daily on 05/05/2019.  Transition to Medrol Dosepak on 05/07/2019 to complete steroid taper as outpatient.  Patient is currently on acyclovir for 1 year after transplant, dose was reduced on 722 11/20/2018 due to renal function. Patient had a baseline CKD with creatinine running between 1.4-1.6.  He had acute on chronic kidney injury and creatinine went up to 2.2 on 722.  Fluid hydration was given and creatinine decreased to baseline 1.5-1.7. His Hickman catheter was removed.  Candida Cruris treated with topical clotrimazole 1% 3 times daily and to resolution..  #  seen by Dr. Tonia Brooms BMT team on 06/30/2019.revaccination at [facility] begins at  12 months post transplant I discussed with [facility] hematology Dr.Choi and he recommends plans as listed below.  -patient to be started on Ixazomib maintenance: -Ixazomib 45m on D1/D8/D15 out of 28 day cycle.  -patient will start re-vaccination 12 months post transplant.  -Post transplant bone marrow biopsy if clinically indicated.    INTERVAL HISTORY Luis ZAPATAis a 64y.o. 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 #Patient is currently on ixizomab 3 mg on day 1, day 8, day 15 out of 28 days. Patient had thrombocytopenia.  Denies any bleeding events. He had one episode of left arm pain, radiating to the back.  Pain has spontaneously resolved.  No prior injury or trauma.  Denies any swelling, pain currently.  Bilateral lower extremity swelling has improved. . .   Review of Systems  Constitutional: Negative for appetite change, chills, fatigue, fever and unexpected weight change.  HENT:   Negative for hearing loss and voice change.   Eyes: Negative for eye problems and icterus.  Respiratory: Negative for chest tightness, cough and shortness of breath.   Cardiovascular: Negative for chest pain and leg swelling.  Gastrointestinal: Negative for abdominal distention and abdominal pain.  Endocrine: Negative for hot flashes.  Genitourinary: Negative for difficulty urinating, dysuria and frequency.   Musculoskeletal: Negative for arthralgias.  Skin: Negative for itching and rash.  Neurological: Negative for light-headedness and numbness.  Hematological: Negative for adenopathy. Does not bruise/bleed easily.  Psychiatric/Behavioral: Negative for confusion.    MEDICAL HISTORY:  Past Medical History:  Diagnosis Date  . AKI ([place] kidney injury) ([place] 01/06/[place]  . Anemia   . 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    autologous stem cell bone marrow transplant on 04/23/2019.  .Marland KitchenFatty liver    on u/s 07/2018  . Hyperlipidemia 03/2004  . Hypertension 1994  . Snores     SURGICAL HISTORY: Past Surgical History:  Procedure Laterality Date  . CATARACT EXTRACTION W/PHACO Right 03/04/2018   Procedure: CATARACT EXTRACTION PHACO AND INTRAOCULAR LENS PLACEMENT ([place]  RIGHT TORIC;  Surgeon: BLeandrew Koyanagi MD;  [place]  Service:  Ophthalmology;  Laterality: Right;  Per [place] no Toric Lens 1:45 5.3  . COLONOSCOPY WITH PROPOFOL N/A 07/20/2018   Procedure: COLONOSCOPY WITH PROPOFOL;  Surgeon: AJonathon Bellows MD;  [facility];  Service: Gastroenterology;  Laterality: N/A;  . ESOPHAGOGASTRODUODENOSCOPY (EGD) WITH PROPOFOL N/A 07/20/2018   Procedure: ESOPHAGOGASTRODUODENOSCOPY (EGD) WITH PROPOFOL;  Surgeon: AJonathon Bellows MD;  [facility];  Service: Gastroenterology;  Laterality: N/A;  . EYE SURGERY Right   . HERNIA REPAIR    . L Lap herniorraphy  04/2000  . Left wrist ganglionectomy      SOCIAL HISTORY: Social History   Socioeconomic History  . Marital status: Single    Spouse name: Not on file  . Number of children: 1  . Years of education: Not on file  . Highest education level: Not on file  Occupational History  . Occupation: [employer]  Tobacco Use  . Smoking status: Never Smoker  . Smokeless tobacco: Never Used  . Tobacco comment: Occassionally  Substance and Sexual Activity  . Alcohol use: Not Currently    Alcohol/week: 3.0 standard drinks    Types: 3 Cans of beer per week  . Drug use: No  . Sexual activity: Not on file  Other Topics Concern  . Not on file  Social History Narrative   Divorced  2009, dating as of 2019   His daughter died 4 days after giving birth to the patient's granddaughter   Has joint custody of his dead daughter's child   Works at [employer] is [employer]:   . Difficulty of Paying Living Expenses:   Food Insecurity:   . Worried About Charity fundraiser in the Last Year:   . Arboriculturist in the Last Year:   Transportation Needs:   . Film/video editor (Medical):   Marland Kitchen Lack of Transportation (Non-Medical):   Physical Activity:   . Days of Exercise per Week:   . Minutes of Exercise per Session:   Stress:   . Feeling of Stress :   Social Connections:   . Frequency of Communication  with Friends and Family:   . Frequency of Social Gatherings with Friends and Family:   . Attends Religious Services:   . Active Member of Clubs or Organizations:   . Attends Archivist Meetings:   Marland Kitchen Marital Status:   Intimate Partner Violence:   . Fear of Current or Ex-Partner:   . Emotionally Abused:   Marland Kitchen Physically Abused:   . Sexually Abused:     FAMILY HISTORY: Family History  Problem Relation Age of Onset  . Hypertension Mother   . Diabetes Mother   . Heart disease Mother        CAD  . Dementia Mother   . Hypertension Father   . Heart disease Father        MI 02/03  . Colon cancer Father   . Hypertension Sister   . Hypertension Sister   . Hypertension Sister   . Prostate cancer Neg Hx     ALLERGIES:  is allergic to bactrim [sulfamethoxazole-trimethoprim]; clonidine derivatives; and tadalafil.  MEDICATIONS:  Current Outpatient Medications  Medication Sig Dispense Refill  . acetaminophen (TYLENOL) 325 MG tablet Take 650 mg by mouth every 6 (six) hours as needed.    Marland Kitchen acyclovir (ZOVIRAX) 400 MG tablet Take 1 tablet (400 mg total) by mouth 2 (two) times daily. 60 tablet 11  . amLODipine (NORVASC) 5 MG tablet TAKE 1-2 TABLETS (5-10 MG TOTAL) BY MOUTH DAILY. 180 tablet 3  . clopidogrel (PLAVIX) 75 MG tablet Take by mouth.    . co-enzyme Q-10 50 MG capsule Take 50 mg by mouth daily.    . [employer] CLOTRIMAZOLE 1 % cream APPLY TO AFFECTED AREA 3 TIMES A DAY FOR 10 DAYS    . cyanocobalamin ([employer]S VITAMIN B12) 1000 MCG tablet Take 1 tablet (1,000 mcg total) by mouth daily. 90 tablet 3  . cyclobenzaprine (FLEXERIL) 5 MG tablet Take 1 tablet (5 mg total) by mouth 3 (three) times daily as needed for muscle spasms. 30 tablet 0  . famotidine (PEPCID) 20 MG tablet Take 20 mg by mouth daily.     . hydrALAZINE (APRESOLINE) 10 MG tablet TAKE 1 TABLET BY MOUTH THREE TIMES A DAY 90 tablet 5  . ixazomib citrate (NINLARO) 3 MG capsule Take 1 capsule (3 mg) by mouth weekly, 3 weeks on, 1  week off, repeat every 4 weeks. Take on an empty stomach 1hr before or 2hr after meals. 3 capsule 3  . lisinopril (ZESTRIL) 20 MG tablet Take 1 tablet (20 mg total) by mouth daily. 90 tablet 3  . LORazepam (ATIVAN) 1 MG tablet TAKE 1 TABLET (1 MG TOTAL) BY MOUTH EVERY 6 (SIX) HOURS AS NEEDED  FOR ANXIETY (FOR NAUSEA)    . magnesium chloride (SLOW-MAG) 64 MG TBEC SR tablet Take 1 tablet (64 mg total) by mouth daily. 60 tablet 0  . metoprolol succinate (TOPROL-XL) 50 MG 24 hr tablet TAKE 2-3 TABLETS (100-150 MG TOTAL) BY MOUTH DAILY. 270 tablet 3  . pravastatin (PRAVACHOL) 10 MG tablet Take 2 tablets (20 mg total) by mouth daily.    Marland Kitchen zinc gluconate 50 MG tablet Take 50 mg by mouth daily.    . ondansetron (ZOFRAN) 8 MG tablet Take 8 mg by mouth every 8 (eight) hours as needed. for nausea    . prochlorperazine (COMPAZINE) 10 MG tablet TAKE 1 TABLET (10 MG TOTAL) BY MOUTH EVERY 6 (SIX) HOURS AS NEEDED FOR NAUSEA     No current facility-administered medications for this visit.     PHYSICAL EXAMINATION: ECOG PERFORMANCE STATUS: 1 - Symptomatic but completely ambulatory Vitals:   02/02/20 0910  BP: (!) 143/87  Pulse: 73  Resp: 18  Temp: (!) 97.3 F (36.3 C)   Filed Weights   02/02/20 0910  Weight: 204 lb 14.4 oz (92.9 kg)    Physical Exam Constitutional:      General: He is not in acute distress. HENT:     Head: Normocephalic and atraumatic.  Eyes:     General: No scleral icterus. Cardiovascular:     Rate and Rhythm: Normal rate and regular rhythm.     Heart sounds: Normal heart sounds.  Pulmonary:     Effort: Pulmonary effort is normal. No respiratory distress.     Breath sounds: No wheezing.  Abdominal:     General: Bowel sounds are normal. There is no distension.     Palpations: Abdomen is soft.  Musculoskeletal:        General: No deformity. Normal range of motion.     Cervical back: Normal range of motion and neck supple.  Skin:    General: Skin is warm and dry.      Findings: No erythema or rash.  Neurological:     Mental Status: He is alert and oriented to person, place, and time. Mental status is at baseline.     Cranial Nerves: No cranial nerve deficit.     Coordination: Coordination normal.  Psychiatric:        Mood and Affect: Mood normal.      LABORATORY DATA:  I have reviewed the data as listed Lab Results  Component Value Date   WBC 5.4 02/02/2020   HGB 12.2 (L) 02/02/2020   HCT 36.2 (L) 02/02/2020   MCV 99.5 02/02/2020   PLT 99 (L) 02/02/2020   Recent Labs    12/23/19 0936 01/27/20 0943 02/02/20 0839  NA 140 139 139  K 4.0 4.1 4.5  CL 106 104 104  CO2 _0 GLUCOSE 98 104* 111*  BUN 25* 22 24*  CREATININE 1.44* 1.38* 1.44*  CALCIUM 9.3 9.6 9.6  GFRNONAA 51* 54* 51*  GFRAA 59* >60 59*  PROT 7.2 7.4 7.2  ALBUMIN 4.5 4.7 4.4  AST _1 ALT _2 ALKPHOS 63 72 68  BILITOT 0.7 0.8 0.7   Iron/TIBC/Ferritin/ %Sat    Component Value Date/Time   IRON 74 07/01/2018 0944   TIBC 250 07/01/2018 0944   FERRITIN 357 07/01/2018 0944   IRONPCTSAT 30 07/01/2018 0944    Lab Results  Component Value Date   TOTALPROTELP 6.8 01/27/2020   ALBUMINELP 3.7 03/10/2019   A1GS 0.3 03/10/2019  A2GS 0.8 03/10/2019   BETS 1.3 03/10/2019   GAMS 0.6 03/10/2019   MSPIKE Not Observed 03/10/2019   SPEI Comment 03/10/2019   Lab Results  Component Value Date   KPAFRELGTCHN 37.0 (H) 01/27/2020   LAMBDASER 16.3 01/27/2020   KAPLAMBRATIO 2.27 (H) 01/27/2020    RADIOGRAPHIC STUDIES: I have personally reviewed the radiological images as listed and agreed with the findings in the report. 10/23/2018 FINDINGS: The heart size and mediastinal contours are within normal limits. Both lungs are clear. The visualized skeletal structures are unremarkable. IMPRESSION: No active cardiopulmonary disease.   ASSESSMENT & PLAN:  1. Multiple myeloma, remission status unspecified ([place])   2. Thrombocytopenia ([place])   3. Encounter for  antineoplastic chemotherapy   4. Stage 3a chronic kidney disease    #Light chain multiple myeloma beta 2 microglobulin 5 and normal albumin.  Stage II. cytogenetics showed normal male chromosome, MDS FISH panel showed trisomy 54- Standard Risk. S/p RVD x 9 and  Autologous bone marrow stem cell transplant at [facility] on 04/23/2019. He has not had post bone marrow transplant bone marrow biopsy yet.  Per [facility] transplant team, bone marrow biopsy will be considered if clinically indicated. Labs are reviewed and discussed with patient. Thrombocytopenia has improved to 99, which is his baseline. Continue monitor. Okay to proceed with next cycle of ixazomab maintenance, 3 mg on day 1, day 8, day 15 out of 28 days.  # Chronic bilaterally lower extremity swelling, likely due to Ixazomib.  Recommend patient to continue use of compression stocking.  Chronic kidney disease, creatinine level is stable. Anemia, hemoglobin Hb 12.2.  Which has been stable.  #Post bone marrow transplant care, Continue acyclovir 400 mg twice daily  #Bone health/osteopenia/multiple myeloma Continue Xgeva monthly.  Continue calcium and vitamin D supplementation.  Follow up on 03/01/20 for evaluation prior to starting next cycle of chemotherapy for maintenance.  Earlie Server, MD, PhD Hematology Oncology [place] at [facility] 02/02/2020

## 2020-02-02 NOTE — Telephone Encounter (Signed)
Survivorship Care Plan visit completed.  Treatment summary reviewed and mailed to patient.  ASCO answers booklet reviewed and mailed to patient.  CARE program and Cancer Transitions discussed with patient along with other resources cancer center offers to patients and caregivers.  Patient verbalized understanding.  SCP packet mailed.  Patient in agreement for APP to have a Virtual visit to introduce them to the Survivorship Clinic.  Encouraged patient to call for any questions or concerns. 

## 2020-02-02 NOTE — Telephone Encounter (Signed)
Pt reports that [employer] is asking for a letter stating when he can return to work or if he will not be able to return. Letter faxed to [facility] on 4/15. Copy of letter given to patient today.   fax # [phone]

## 2020-02-08 ENCOUNTER — Other Ambulatory Visit: Payer: Self-pay | Admitting: *Deleted

## 2020-02-08 NOTE — Telephone Encounter (Signed)
Patient states he is taking Metoprolol 4 per day.  Is this correct?  If so, new Rx with new sig needs to be sent.  Please advise.

## 2020-02-09 ENCOUNTER — Encounter: Payer: Self-pay | Admitting: Oncology

## 2020-02-10 ENCOUNTER — Telehealth: Payer: Self-pay

## 2020-02-10 MED ORDER — METOPROLOL SUCCINATE ER 50 MG PO TB24: 200.0000 mg | ORAL_TABLET | Freq: Every day | ORAL | 1 refills | Status: DC

## 2020-02-10 NOTE — Telephone Encounter (Signed)
Noted. Thanks.

## 2020-02-10 NOTE — Telephone Encounter (Signed)
Pt sent mychart message stating that office visit note form April 15 needed to be sent to [facility].  Office Visit note faxed at requested.  Claim number [id number].  Fax : [phone] phone: [phone]  ext [phone]

## 2020-02-10 NOTE — Telephone Encounter (Signed)
Okay to continue with 4 tabs a day.  I sent the rx.  Please get update on patient's BP and pulse.  Thanks.

## 2020-02-10 NOTE — Telephone Encounter (Signed)
BP and Pulse has been good.

## 2020-02-14 ENCOUNTER — Other Ambulatory Visit: Payer: Self-pay

## 2020-02-14 ENCOUNTER — Inpatient Hospital Stay: Payer: BC Managed Care – PPO | Attending: Oncology | Admitting: Oncology

## 2020-02-14 DIAGNOSIS — Z8249 Family history of ischemic heart disease and other diseases of the circulatory system: Secondary | ICD-10-CM | POA: Insufficient documentation

## 2020-02-14 DIAGNOSIS — E785 Hyperlipidemia, unspecified: Secondary | ICD-10-CM | POA: Insufficient documentation

## 2020-02-14 DIAGNOSIS — M8580 Other specified disorders of bone density and structure, unspecified site: Secondary | ICD-10-CM | POA: Insufficient documentation

## 2020-02-14 DIAGNOSIS — Z833 Family history of diabetes mellitus: Secondary | ICD-10-CM | POA: Insufficient documentation

## 2020-02-14 DIAGNOSIS — C9000 Multiple myeloma not having achieved remission: Secondary | ICD-10-CM

## 2020-02-14 DIAGNOSIS — N189 Chronic kidney disease, unspecified: Secondary | ICD-10-CM | POA: Insufficient documentation

## 2020-02-14 DIAGNOSIS — Z9481 Bone marrow transplant status: Secondary | ICD-10-CM | POA: Insufficient documentation

## 2020-02-14 DIAGNOSIS — D649 Anemia, unspecified: Secondary | ICD-10-CM | POA: Insufficient documentation

## 2020-02-14 DIAGNOSIS — Z79899 Other long term (current) drug therapy: Secondary | ICD-10-CM | POA: Insufficient documentation

## 2020-02-14 DIAGNOSIS — I129 Hypertensive chronic kidney disease with stage 1 through stage 4 chronic kidney disease, or unspecified chronic kidney disease: Secondary | ICD-10-CM | POA: Insufficient documentation

## 2020-02-14 NOTE — Progress Notes (Addendum)
Survivorship Clinic Consult Note [facility]  [phone] [phone]  CLINIC:  Survivorship  REASON FOR VISIT:  Survivorship surveillance visit for patient with history of Multiple Myeloma  I connected with Luis Burke on 02/04/2020 at  2:00 PM EDT by telephone visit and verified that I am speaking with the correct person using two identifiers.   I discussed the limitations, risks, security and privacy concerns of performing an evaluation and management service by telemedicine and the availability of in-person appointments. I also discussed with the patient that there may be a patient responsible charge related to this service. The patient expressed understanding and agreed to proceed.   Other persons participating in the visit and their role in the encounter: None  Patient's location: Home Provider's location: Clinic   BRIEF ONCOLOGIC HISTORY:  Oncology History  Multiple myeloma ([place])  08/26/2018 Initial Diagnosis   Multiple myeloma ([place])   08/28/2018 - 03/30/2019 Chemotherapy   The patient had bortezomib SQ (VELCADE) chemo injection 2.75 mg, 1.3 mg/m2 = 2.75 mg, Subcutaneous,  Once, 10 of 10 cycles Administration: 2.75 mg (08/28/2018), 2.75 mg (09/04/2018), 2.75 mg (09/15/2018), 2.75 mg (09/22/2018), 2.75 mg (09/29/2018), 2.75 mg (10/06/2018), 2.75 mg (10/13/2018), 2.75 mg (10/20/2018), 2.75 mg (10/27/2018), 2.75 mg (11/03/2018), 2.75 mg (11/10/2018), 2.75 mg (11/17/2018), 2.75 mg (11/24/2018), 2.75 mg (12/01/2018), 2.75 mg (12/08/2018), 2.75 mg (12/16/2018), 2.75 mg (12/23/2018), 2.75 mg (12/30/2018), 2.75 mg (01/06/2019), 2.75 mg (01/13/2019), 2.75 mg (01/20/2019), 2.75 mg (01/27/2019), 2.75 mg (02/03/2019), 2.75 mg (02/10/2019), 2.75 mg (02/17/2019), 2.75 mg (02/24/2019), 2.75 mg (03/03/2019), 2.75 mg (03/10/2019), 2.75 mg (03/17/2019)  for chemotherapy treatment.    07/21/2019 -  Chemotherapy   The patient had [No matching medication found in this treatment  plan]  for chemotherapy treatment.       INTERVAL HISTORY:  Luis Burke is a 64 year old male who was treated for multiple myeloma with Dr. Tasia Catchings.  He was initially seen and evaluated for anemia and thrombocytopenia with fatigue and shortness of breath with exertion.  In October 2019 lab work showed elevated kappa free light chain ratio as well as an LDH.  Had bone marrow biopsy which showed 80% hypercellular marrow and plasma cell neoplasm up to 95%.  This was consistent with plasma cell myeloma.  In November 2019 had skeletal survey which showed questionable small lucent lesions within the midshaft of the humerus bilaterally.  PET scan from November 2019 did not reveal definite hypermetabolic bone disease but CT findings are highly suspicious for numerous small lesions involving the spine, sternum and scattered ribs.  He was started on systemic chemo with Velcade/dexamethasone and Revlimid in November 2019.  He was referred to [facility] clinic in January 2020.  They recommended continuing myeloma induction treatment with RVD to maximize response from induction therapy .  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  Developed febrile neutropenia grade 3 and treated with IV antibiotics from 05/04/2019 through 05/06/2019.  He is currently undergoing vaccinations as recommended.  Currently on maintenance versus consolidation depending on myeloma labs Ninlaro 3 mg on D1/D8/D/15 every 28 days.  He also takes [place] monthly for osteopenia/bone health and multiple myeloma.  He has follow-up at the end of May to begin next cycle of chemotherapy.  ADDITIONAL REVIEW OF SYSTEMS:  Review of Systems  Constitutional: Negative.  Negative for chills, fever, malaise/fatigue and weight loss.  HENT: Negative for congestion, ear pain and tinnitus.   Eyes: Negative.  Negative for blurred vision and double  vision.  Respiratory: Negative.  Negative for cough, sputum production and shortness of breath.   Cardiovascular:  Positive for leg swelling. Negative for chest pain and palpitations.  Gastrointestinal: Negative.  Negative for abdominal pain, constipation, diarrhea, nausea and vomiting.  Genitourinary: Negative for dysuria, frequency and urgency.  Musculoskeletal: Positive for neck pain. Negative for back pain and falls.  Skin: Negative.  Negative for rash.  Neurological: Negative.  Negative for weakness and headaches.  Endo/Heme/Allergies: Negative.  Does not bruise/bleed easily.  Psychiatric/Behavioral: Negative.  Negative for depression. The patient is not nervous/anxious and does not have insomnia.     PAST MEDICAL & SURGICAL HISTORY:  Past Medical History:  Diagnosis Date  . AKI (acute kidney injury) ([place]) 01/06/2019  . Anemia   . 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  Marland Kitchen Fatty liver    on u/s 07/2018  . Hyperlipidemia 03/2004  . Hypertension 1994  . Snores    Past Surgical History:  Procedure Laterality Date  . CATARACT EXTRACTION W/PHACO Right 03/04/2018   Procedure: CATARACT EXTRACTION PHACO AND INTRAOCULAR LENS PLACEMENT ([place])  RIGHT TORIC;  Surgeon: Leandrew Koyanagi, MD;  Location: [facility];  Service: Ophthalmology;  Laterality: Right;  [place] no Toric Lens 1:45 5.3  . COLONOSCOPY WITH PROPOFOL N/A 07/20/2018   Procedure: COLONOSCOPY WITH PROPOFOL;  Surgeon: Jonathon Bellows, MD;  Location: [facility] ENDOSCOPY;  Service: Gastroenterology;  Laterality: N/A;  . ESOPHAGOGASTRODUODENOSCOPY (EGD) WITH PROPOFOL N/A 07/20/2018   Procedure: ESOPHAGOGASTRODUODENOSCOPY (EGD) WITH PROPOFOL;  Surgeon: Jonathon Bellows, MD;  Location: [facility] ENDOSCOPY;  Service: Gastroenterology;  Laterality: N/A;  . EYE SURGERY Right   . HERNIA REPAIR    . L Lap herniorraphy  04/2000  . Left wrist ganglionectomy      SOCIAL HISTORY:  None  CURRENT MEDICATIONS:  Current Outpatient Medications on File Prior to Visit  Medication Sig Dispense Refill  .  acetaminophen (TYLENOL) 325 MG tablet Take 650 mg by mouth every 6 (six) hours as needed.    Marland Kitchen acyclovir (ZOVIRAX) 400 MG tablet Take 1 tablet (400 mg total) by mouth 2 (two) times daily. 60 tablet 11  . amLODipine (NORVASC) 5 MG tablet TAKE 1-2 TABLETS (5-10 MG TOTAL) BY MOUTH DAILY. 180 tablet 3  . clopidogrel (PLAVIX) 75 MG tablet Take by mouth.    . co-enzyme Q-10 50 MG capsule Take 50 mg by mouth daily.    . [employer] CLOTRIMAZOLE 1 % cream APPLY TO AFFECTED AREA 3 TIMES A DAY FOR 10 DAYS    . cyanocobalamin ([employer] VITAMIN B12) 1000 MCG tablet Take 1 tablet (1,000 mcg total) by mouth daily. 90 tablet 3  . cyclobenzaprine (FLEXERIL) 5 MG tablet Take 1 tablet (5 mg total) by mouth 3 (three) times daily as needed for muscle spasms. 30 tablet 0  . famotidine (PEPCID) 20 MG tablet Take 20 mg by mouth daily.     . hydrALAZINE (APRESOLINE) 10 MG tablet TAKE 1 TABLET BY MOUTH THREE TIMES A DAY 90 tablet 5  . ixazomib citrate (NINLARO) 3 MG capsule Take 1 capsule (3 mg) by mouth weekly, 3 weeks on, 1 week off, repeat every 4 weeks. Take on an empty stomach 1hr before or 2hr after meals. 3 capsule 3  . lisinopril (ZESTRIL) 20 MG tablet Take 1 tablet (20 mg total) by mouth daily. 90 tablet 3  . LORazepam (ATIVAN) 1 MG tablet TAKE 1 TABLET (1 MG TOTAL) BY MOUTH EVERY 6 (  SIX) HOURS AS NEEDED FOR ANXIETY (FOR NAUSEA)    . magnesium chloride (SLOW-MAG) 64 MG TBEC SR tablet Take 1 tablet (64 mg total) by mouth daily. 60 tablet 0  . metoprolol succinate (TOPROL-XL) 50 MG 24 hr tablet Take 4 tablets (200 mg total) by mouth daily. Take with or immediately following a meal. 360 tablet 1  . ondansetron (ZOFRAN) 8 MG tablet Take 8 mg by mouth every 8 (eight) hours as needed. for nausea    . pravastatin (PRAVACHOL) 10 MG tablet Take 2 tablets (20 mg total) by mouth daily.    . prochlorperazine (COMPAZINE) 10 MG tablet TAKE 1 TABLET (10 MG TOTAL) BY MOUTH EVERY 6 (SIX) HOURS AS NEEDED FOR NAUSEA    . zinc gluconate 50 MG  tablet Take 50 mg by mouth daily.     No current facility-administered medications on file prior to visit.    ALLERGIES:  Allergies  Allergen Reactions  . Bactrim [Sulfamethoxazole-Trimethoprim]     Elevated creatinine  . Clonidine Derivatives     Low platelets, edema  . Tadalafil     Intolerant,  Didn't feel well on med    PHYSICAL EXAM:  Limited due to virtual platform  LABORATORY DATA:  Lab Results  Component Value Date   WBC 5.4 02/02/2020   HGB 12.2 (L) 02/02/2020   HCT 36.2 (L) 02/02/2020   MCV 99.5 02/02/2020   PLT 99 (L) 02/02/2020      Chemistry      Component Value Date/Time   NA 139 02/02/2020 0839   K 4.5 02/02/2020 0839   CL 104 02/02/2020 0839   CO2 26 02/02/2020 0839   BUN 24 (H) 02/02/2020 0839   CREATININE 1.44 (H) 02/02/2020 0839      Component Value Date/Time   CALCIUM 9.6 02/02/2020 0839   ALKPHOS 68 02/02/2020 0839   AST 21 02/02/2020 0839   ALT 22 02/02/2020 0839   BILITOT 0.7 02/02/2020 0839     DIAGNOSTIC Imaging: Initial Pet 08/25/18  IMPRESSION: No definite hypermetabolic bone disease but the CT findings are highly suspicious for numerous small myelomatous lesions involving the spine, sternum and scattered ribs.  ASSESSMENT & PLAN:  Luis Burke is a pleasant 64 y.o. male/male with history of Multiple Myeloma, treated RVD induction, stem cell transplant ([facility]) and currently on Ixazomib (Ninlaro). Patient presents to survivorship clinic today for survivorship care plan visit and to address any acute survivorship concerns since completing treatment.    1. History of Multiple myeloma: Clinically, he is without evidence of disease recurrence based on physical exam/diagnostic imaging.  Today, he received a copy of his survivorship care plan (SCP) document, which was reviewed with him in detail.  The SCP details his cancer treatment history and potential late/long-term side effects of those treatments.  We discussed the follow-up  schedule he can anticipate with interval imaging for surveillance of his cancer.  I have also shared a copy of his treatment summary/SCP with his PCP.  Luis Burke will return to the survivorship clinic as needed; he will return to [place] at [facility] for surveillance visit with Dr. Tasia Catchings later this month.    2. Problem at visit: Neck pain and swelling in BLE  Luis Burke states Dr. Tasia Catchings is aware of both neck pain and swelling in his bilateral lower extremities.  They have completed bilateral ultrasounds to rule out DVT which was negative and a cervical spine x-ray which did not reveal any acute abnormality only mild  degenerative change with diffuse osteopenia.  Currently on Xgeva.  He is taking Tylenol for the neck pain with some relief.  He is currently unable to work due to swelling.  He is considered disabled.  3. Smoking cessation: I commended Luis Burke continued efforts to remain tobacco-free.  We discussed that one of the most important risk reduction strategies in preventing cancer recurrence in lung cancer patients is smoking cessation.  He is committed to abstaining from tobacco.  4. Physical activity/Healthy eating: Getting adequate physical activity and maintaining a healthy diet as a cancer survivor is important for overall wellness and reduces the risk of cancer recurrence. We discussed the CARE program which is a fitness program that is offered to cancer survivors free of charge.  We also reviewed the American Cancer Society's booklet with recommendations for nutrition and physical activity.    4. Health promotion/Cancer screening:  Luis Burke is reportedly up-to-date on his colonoscopy, PSA tests, skin screenings, and vaccinations.  I encouraged him to talk with his PCP about all age and gender appropriate cancer and preventative health screenings as per national guidelines.  He was also instructed to maintain a healthy body weight throughout his life, adopt a physically active  lifestyle with at least 30 minutes of moderate intensity activity on most days of the week.  Activity recommendations may require modification based on treatment sequelae.  Consume a healthy diet with emphasis on plant sources.  Diet recommendations may be modified based on severity of bowel dysfunction.  Consider a daily aspirin 325 mg for secondary prevention and eliminate or limit alcohol consumption no more than 1 drink per day for women and 2 drinks a day for men.  Continue to abstain from tobacco.   5. Support services/Counseling: Luis Burke was seen today in in effort to address both the physical and social concerns of our cancer survivors at [facility] It is not uncommon for this period of the patient's cancer care trajectory to be one of many emotions and stressors.  I provided support today through active listening, validation of concerns, and expressive supportive counseling.  Luis Burke was encouraged to take advantage of our support services programs and support groups to better cope in his new life as a cancer survivor after completing anti-cancer treatment.   It is not uncommon for this period of the patient's cancer care trajectory to be one of many emotions and stressors.  We discussed an opportunity to participate in our cancer transition program that is designed for patients after they have completed treatment.  He was given information regarding available services and encouraged to contact us with any questions to help with enrolling.  He was also provided a copy of survivorship resources from the NCCN.   NCCN Guidelines and Recommendations:       Dispo:  -RTC for follow-up with oncologist Dr. Tasia Catchings on 03/01/20 -RT [facility] as scheduled.  I provided 25 minutes of non face-to-face telephone visit time during this encounter, and > 50% was spent counseling as documented under my assessment & plan.   Rulon Abide, AGNP-C [place] at  [place] 02/14/20 1:13 PM

## 2020-02-29 ENCOUNTER — Other Ambulatory Visit: Payer: Self-pay

## 2020-02-29 DIAGNOSIS — C9000 Multiple myeloma not having achieved remission: Secondary | ICD-10-CM

## 2020-03-01 ENCOUNTER — Inpatient Hospital Stay: Payer: BC Managed Care – PPO

## 2020-03-01 ENCOUNTER — Other Ambulatory Visit: Payer: Self-pay

## 2020-03-01 ENCOUNTER — Encounter: Payer: Self-pay | Admitting: Oncology

## 2020-03-01 ENCOUNTER — Inpatient Hospital Stay (HOSPITAL_BASED_OUTPATIENT_CLINIC_OR_DEPARTMENT_OTHER): Payer: BC Managed Care – PPO | Admitting: Oncology

## 2020-03-01 VITALS — BP 145/94 | HR 70 | Temp 98.2°F | Wt 207.2 lb

## 2020-03-01 DIAGNOSIS — Z8249 Family history of ischemic heart disease and other diseases of the circulatory system: Secondary | ICD-10-CM | POA: Diagnosis not present

## 2020-03-01 DIAGNOSIS — C9000 Multiple myeloma not having achieved remission: Secondary | ICD-10-CM

## 2020-03-01 DIAGNOSIS — N189 Chronic kidney disease, unspecified: Secondary | ICD-10-CM | POA: Diagnosis not present

## 2020-03-01 DIAGNOSIS — N179 Acute kidney failure, unspecified: Secondary | ICD-10-CM

## 2020-03-01 DIAGNOSIS — M8580 Other specified disorders of bone density and structure, unspecified site: Secondary | ICD-10-CM | POA: Diagnosis not present

## 2020-03-01 DIAGNOSIS — Z9481 Bone marrow transplant status: Secondary | ICD-10-CM | POA: Diagnosis not present

## 2020-03-01 DIAGNOSIS — E785 Hyperlipidemia, unspecified: Secondary | ICD-10-CM | POA: Diagnosis not present

## 2020-03-01 DIAGNOSIS — D649 Anemia, unspecified: Secondary | ICD-10-CM | POA: Diagnosis not present

## 2020-03-01 DIAGNOSIS — I129 Hypertensive chronic kidney disease with stage 1 through stage 4 chronic kidney disease, or unspecified chronic kidney disease: Secondary | ICD-10-CM | POA: Diagnosis not present

## 2020-03-01 DIAGNOSIS — Z833 Family history of diabetes mellitus: Secondary | ICD-10-CM | POA: Diagnosis not present

## 2020-03-01 DIAGNOSIS — Z79899 Other long term (current) drug therapy: Secondary | ICD-10-CM | POA: Diagnosis not present

## 2020-03-01 LAB — COMPREHENSIVE METABOLIC PANEL
ALT: 30 U/L (ref 0–44)
AST: 25 U/L (ref 15–41)
Albumin: 4.5 g/dL (ref 3.5–5.0)
Alkaline Phosphatase: 71 U/L (ref 38–126)
Anion gap: 9 (ref 5–15)
BUN: 18 mg/dL (ref 8–23)
CO2: 27 mmol/L (ref 22–32)
Calcium: 9.4 mg/dL (ref 8.9–10.3)
Chloride: 105 mmol/L (ref 98–111)
Creatinine, Ser: 1.35 mg/dL — ABNORMAL HIGH (ref 0.61–1.24)
GFR calc Af Amer: >60 mL/min (ref >60)
GFR calc non Af Amer: 55 mL/min — ABNORMAL LOW (ref >60)
Glucose, Bld: 106 mg/dL — ABNORMAL HIGH (ref 70–99)
Potassium: 4.4 mmol/L (ref 3.5–5.1)
Sodium: 141 mmol/L (ref 135–145)
Total Bilirubin: 0.7 mg/dL (ref 0.3–1.2)
Total Protein: 7.4 g/dL (ref 6.5–8.1)

## 2020-03-01 LAB — CBC WITH DIFFERENTIAL/PLATELET
Abs Immature Granulocytes: 0.02 10*3/uL (ref 0.00–0.07)
Basophils Absolute: 0 10*3/uL (ref 0.0–0.1)
Basophils Relative: 0 %
Eosinophils Absolute: 0.1 10*3/uL (ref 0.0–0.5)
Eosinophils Relative: 1 %
HCT: 35.5 % — ABNORMAL LOW (ref 39.0–52.0)
Hemoglobin: 12.1 g/dL — ABNORMAL LOW (ref 13.0–17.0)
Immature Granulocytes: 0 %
Lymphocytes Relative: 28 %
Lymphs Abs: 1.4 10*3/uL (ref 0.7–4.0)
MCH: 33.8 pg (ref 26.0–34.0)
MCHC: 34.1 g/dL (ref 30.0–36.0)
MCV: 99.2 fL (ref 80.0–100.0)
Monocytes Absolute: 0.8 10*3/uL (ref 0.1–1.0)
Monocytes Relative: 17 %
Neutro Abs: 2.6 10*3/uL (ref 1.7–7.7)
Neutrophils Relative %: 54 %
Platelets: 72 10*3/uL — ABNORMAL LOW (ref 150–400)
RBC: 3.58 MIL/uL — ABNORMAL LOW (ref 4.22–5.81)
RDW: 13.3 % (ref 11.5–15.5)
WBC: 4.8 10*3/uL (ref 4.0–10.5)
nRBC: 0 % (ref 0.0–0.2)

## 2020-03-01 MED ORDER — DENOSUMAB 120 MG/1.7ML ~~LOC~~ SOLN
120.0000 mg | Freq: Once | SUBCUTANEOUS | Status: AC
  Administered 2020-03-01: 120 mg via SUBCUTANEOUS
  Filled 2020-03-01: qty 1.7

## 2020-03-01 NOTE — Progress Notes (Signed)
Patient here for follow up. Pt reports continues swelling to legs and neck pain.

## 2020-03-01 NOTE — Progress Notes (Signed)
Hematology/Oncology  Follow up note [facility] [phone] [phone]   Patient Care Team: Tonia Ghent, MD as PCP - General (Family Medicine) Earlie Server, MD as Consulting Physician (Oncology) Beather Arbour Lilyan Punt, MD as Referring Physician (Hematology and Oncology) Diannia Ruder, MD as Referring Physician (Hematology and Oncology)  REASON FOR VISIT Follow up for multiple myeloma.   HISTORY OF PRESENTING ILLNESS:  Luis Burke is a  64 y.o.  male with PMH listed below presents for follow up of multiple myeloma.  # 07/27/2018 multiple myeloma panel showed M protein of 0.1, IgG 662, IgA 49, IgM 7. Patient was called back to further labs done. 08/10/2018, free light chain ratio showed extremely high level of kappa free light chain 10,183, with a kappa lambda light chain ratio of 1414.31 LDH 164 Beta-2 microglobulin 5 Patient was called and discuss about results.  He was recommended to undergo bone marrow biopsy and PET scan. 08/19/2018 bone marrow biopsy showed hypercellular marrow 80%, involved by plasma cell neoplasm up to 95%.  Consistent with plasma cell myeloma.  FISH and cytogenetics are pending.  09/10/2018 skeletal survey showed questionable small lucent lesions within the midshaft of the humerus bilaterally.  Otherwise no suspicious focal lytic lesion or acute bone abnormality. 08/25/2018 PET scan showed no definite hypermetabolic bone disease but CT findings are highly suspicious for numerous small myelomatous lesions involving the spine, sternum and scattered ribs.  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 He received preparative regimen with melphalan 200 mg/m on 04/22/2019 followed by autologous stem cell infusion on 04/23/2019. Currently he is transfusion independent.  Transfusion criteria with as needed hemoglobin less than 7.5 for platelet less than 10,000.  Transplant course was complicated with  febrile neutropenia grade 3, treated with vancomycin and cephapirin until engraftment on 05/06/2019. Patient also had engraftment syndrome and had to be started on Solu-Medrol 25 mg twice daily on 05/05/2019.  Transition to Medrol Dosepak on 05/07/2019 to complete steroid taper as outpatient.  Patient is currently on acyclovir for 1 year after transplant, dose was reduced on 722 11/20/2018 due to renal function. Patient had a baseline CKD with creatinine running between 1.4-1.6.  He had acute on chronic kidney injury and creatinine went up to 2.2 on 722.  Fluid hydration was given and creatinine decreased to baseline 1.5-1.7. His Hickman catheter was removed.  Candida Cruris treated with topical clotrimazole 1% 3 times daily and to resolution..  #  seen by Dr. Tonia Brooms BMT team on 06/30/2019.revaccination at [place] begins at 12 months post transplant I discussed with [facility] hematology Dr.Choi and he recommends plans as listed below.  -patient to be started on Ixazomib maintenance: -Ixazomib 78m on D1/D8/D15 out of 28 day cycle.  -patient will start re-vaccination 12 months post transplant.  -Post transplant bone marrow biopsy if clinically indicated.   INTERVAL HISTORY Luis JUSTENis a 64y.o. 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 #Patient is currently on ixizomab 3 mg on day 1, day 8, day 15 out of 28 days. Patient has thrombocytopenia.  Denies any bleeding events.  Today he has no new complaints.  Feeling well. He is starting next cycle tomorrow. . .   Review of Systems  Constitutional: Negative for appetite change, chills, fatigue, fever and unexpected weight change.  HENT:   Negative for hearing loss and voice change.   Eyes: Negative for  eye problems and icterus.  Respiratory: Negative for chest tightness, cough and shortness of breath.   Cardiovascular: Negative for chest pain and leg swelling.  Gastrointestinal:  Negative for abdominal distention and abdominal pain.  Endocrine: Negative for hot flashes.  Genitourinary: Negative for difficulty urinating, dysuria and frequency.   Musculoskeletal: Negative for arthralgias.  Skin: Negative for itching and rash.  Neurological: Negative for light-headedness and numbness.  Hematological: Negative for adenopathy. Does not bruise/bleed easily.  Psychiatric/Behavioral: Negative for confusion.    MEDICAL HISTORY:  Past Medical History:  Diagnosis Date  . AKI (acute kidney injury) ([place]) 01/06/2019  . Anemia   . 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[place])    autologous stem cell bone marrow transplant on 04/23/2019.  Marland Kitchen Fatty liver    on u/s 07/2018  . Hyperlipidemia 03/2004  . Hypertension 1994  . Snores     SURGICAL HISTORY: Past Surgical History:  Procedure Laterality Date  . CATARACT EXTRACTION W/PHACO Right 03/04/2018   Procedure: CATARACT EXTRACTION PHACO AND INTRAOCULAR LENS PLACEMENT ([place])  RIGHT TORIC;  Surgeon: Leandrew Koyanagi, MD;  Location: [place];  Service: Ophthalmology;  Laterality: Right;  Per [place] no Toric Lens 1:45 5.3  . COLONOSCOPY WITH PROPOFOL N/A 07/20/2018   Procedure: COLONOSCOPY WITH PROPOFOL;  Surgeon: Jonathon Bellows, MD;  Location: [facility] ENDOSCOPY;  Service: Gastroenterology;  Laterality: N/A;  . ESOPHAGOGASTRODUODENOSCOPY (EGD) WITH PROPOFOL N/A 07/20/2018   Procedure: ESOPHAGOGASTRODUODENOSCOPY (EGD) WITH PROPOFOL;  Surgeon: Jonathon Bellows, MD;  Location: [facility] ENDOSCOPY;  Service: Gastroenterology;  Laterality: N/A;  . EYE SURGERY Right   . HERNIA REPAIR    . L Lap herniorraphy  04/2000  . Left wrist ganglionectomy      SOCIAL HISTORY: Social History   Socioeconomic History  . Marital status: Single    Spouse name: Not on file  . Number of children: 1  . Years of education: Not on file  . Highest education level: Not on file  Occupational History  . Occupation: [employer]  Tobacco Use  . Smoking status: Never  Smoker  . Smokeless tobacco: Never Used  . Tobacco comment: Occassionally  Substance and Sexual Activity  . Alcohol use: Not Currently    Alcohol/week: 3.0 standard drinks    Types: 3 Cans of beer per week  . Drug use: No  . Sexual activity: Not on file  Other Topics Concern  . Not on file  Social History Narrative   Divorced 01-05-2008, dating as of 01/04/2018   His daughter died 4 days after giving birth to the patient's granddaughter   Has joint custody of his dead daughter's child   Works at [employer] is [employer]:   . Difficulty of Paying Living Expenses:   Food Insecurity:   . Worried About Charity fundraiser in the Last Year:   . Arboriculturist in the Last Year:   Transportation Needs:   . Film/video editor (Medical):   Marland Kitchen Lack of Transportation (Non-Medical):   Physical Activity:   . Days of Exercise per Week:   . Minutes of Exercise per Session:   Stress:   . Feeling of Stress :   Social Connections:   . Frequency of Communication with Friends and Family:   . Frequency of Social Gatherings with Friends and Family:   . Attends Religious Services:   . Active Member of Clubs or Organizations:   . Attends Archivist Meetings:   .  Marital Status:   Intimate Partner Violence:   . Fear of Current or Ex-Partner:   . Emotionally Abused:   Marland Kitchen Physically Abused:   . Sexually Abused:     FAMILY HISTORY: Family History  Problem Relation Age of Onset  . Hypertension Mother   . Diabetes Mother   . Heart disease Mother        CAD  . Dementia Mother   . Hypertension Father   . Heart disease Father        MI 02/03  . Colon cancer Father   . Hypertension Sister   . Hypertension Sister   . Hypertension Sister   . Prostate cancer Neg Hx     ALLERGIES:  is allergic to bactrim [sulfamethoxazole-trimethoprim]; clonidine derivatives; and tadalafil.  MEDICATIONS:  Current Outpatient Medications    Medication Sig Dispense Refill  . acetaminophen (TYLENOL) 325 MG tablet Take 650 mg by mouth every 6 (six) hours as needed.    Marland Kitchen acyclovir (ZOVIRAX) 400 MG tablet Take 1 tablet (400 mg total) by mouth 2 (two) times daily. 60 tablet 11  . amLODipine (NORVASC) 5 MG tablet TAKE 1-2 TABLETS (5-10 MG TOTAL) BY MOUTH DAILY. 180 tablet 3  . clopidogrel (PLAVIX) 75 MG tablet Take by mouth.    . co-enzyme Q-10 50 MG capsule Take 50 mg by mouth daily.    . cyanocobalamin (CVS VITAMIN B12) 1000 MCG tablet Take 1 tablet (1,000 mcg total) by mouth daily. 90 tablet 3  . famotidine (PEPCID) 20 MG tablet Take 20 mg by mouth daily.     . hydrALAZINE (APRESOLINE) 10 MG tablet TAKE 1 TABLET BY MOUTH THREE TIMES A DAY 90 tablet 5  . ixazomib citrate (NINLARO) 3 MG capsule Take 1 capsule (3 mg) by mouth weekly, 3 weeks on, 1 week off, repeat every 4 weeks. Take on an empty stomach 1hr before or 2hr after meals. 3 capsule 3  . lisinopril (ZESTRIL) 20 MG tablet Take 1 tablet (20 mg total) by mouth daily. 90 tablet 3  . LORazepam (ATIVAN) 1 MG tablet TAKE 1 TABLET (1 MG TOTAL) BY MOUTH EVERY 6 (SIX) HOURS AS NEEDED FOR ANXIETY (FOR NAUSEA)    . magnesium chloride (SLOW-MAG) 64 MG TBEC SR tablet Take 1 tablet (64 mg total) by mouth daily. 60 tablet 0  . metoprolol succinate (TOPROL-XL) 50 MG 24 hr tablet Take 4 tablets (200 mg total) by mouth daily. Take with or immediately following a meal. 360 tablet 1  . ondansetron (ZOFRAN) 8 MG tablet Take 8 mg by mouth every 8 (eight) hours as needed. for nausea    . pravastatin (PRAVACHOL) 10 MG tablet Take 2 tablets (20 mg total) by mouth daily.    . prochlorperazine (COMPAZINE) 10 MG tablet TAKE 1 TABLET (10 MG TOTAL) BY MOUTH EVERY 6 (SIX) HOURS AS NEEDED FOR NAUSEA    . zinc gluconate 50 MG tablet Take 50 mg by mouth daily.    . CVS CLOTRIMAZOLE 1 % cream APPLY TO AFFECTED AREA 3 TIMES A DAY FOR 10 DAYS    . cyclobenzaprine (FLEXERIL) 5 MG tablet Take 1 tablet (5 mg total) by  mouth 3 (three) times daily as needed for muscle spasms. (Patient not taking: Reported on 03/01/2020) 30 tablet 0   No current facility-administered medications for this visit.     PHYSICAL EXAMINATION: ECOG PERFORMANCE STATUS: 1 - Symptomatic but completely ambulatory Vitals:   03/01/20 1005  BP: (!) 145/94  Pulse: 70  Temp: 98.2  F (36.8 C)   Filed Weights   03/01/20 1005  Weight: 207 lb 3.2 oz (94 kg)    Physical Exam Constitutional:      General: He is not in acute distress. HENT:     Head: Normocephalic and atraumatic.  Eyes:     General: No scleral icterus. Cardiovascular:     Rate and Rhythm: Normal rate and regular rhythm.     Heart sounds: Normal heart sounds.  Pulmonary:     Effort: Pulmonary effort is normal. No respiratory distress.     Breath sounds: No wheezing.  Abdominal:     General: Bowel sounds are normal. There is no distension.     Palpations: Abdomen is soft.  Musculoskeletal:        General: No deformity. Normal range of motion.     Cervical back: Normal range of motion and neck supple.  Skin:    General: Skin is warm and dry.     Findings: No erythema or rash.  Neurological:     Mental Status: He is alert and oriented to person, place, and time. Mental status is at baseline.     Cranial Nerves: No cranial nerve deficit.     Coordination: Coordination normal.  Psychiatric:        Mood and Affect: Mood normal.      LABORATORY DATA:  I have reviewed the data as listed Lab Results  Component Value Date   WBC 4.8 03/01/2020   HGB 12.1 (L) 03/01/2020   HCT 35.5 (L) 03/01/2020   MCV 99.2 03/01/2020   PLT 72 (L) 03/01/2020   Recent Labs    01/27/20 0943 02/02/20 0839 03/01/20 0939  NA 139 139 141  K 4.1 4.5 4.4  CL 104 104 105  CO2 26 26 27   GLUCOSE 104* 111* 106*  BUN 22 24* 18  CREATININE 1.38* 1.44* 1.35*  CALCIUM 9.6 9.6 9.4  GFRNONAA 54* 51* 55*  GFRAA >60 59* >60  PROT 7.4 7.2 7.4  ALBUMIN 4.7 4.4 4.5  AST 22 21 25     ALT 24 22 30   ALKPHOS 72 68 71  BILITOT 0.8 0.7 0.7   Iron/TIBC/Ferritin/ %Sat    Component Value Date/Time   IRON 74 07/01/2018 0944   TIBC 250 07/01/2018 0944   FERRITIN 357 07/01/2018 0944   IRONPCTSAT 30 07/01/2018 0944    Lab Results  Component Value Date   TOTALPROTELP 6.8 01/27/2020   ALBUMINELP 3.7 03/10/2019   A1GS 0.3 03/10/2019   A2GS 0.8 03/10/2019   BETS 1.3 03/10/2019   GAMS 0.6 03/10/2019   MSPIKE Not Observed 03/10/2019   SPEI Comment 03/10/2019   Lab Results  Component Value Date   KPAFRELGTCHN 37.0 (H) 01/27/2020   LAMBDASER 16.3 01/27/2020   KAPLAMBRATIO 2.27 (H) 01/27/2020    RADIOGRAPHIC STUDIES: I have personally reviewed the radiological images as listed and agreed with the findings in the report. 10/23/2018 FINDINGS: The heart size and mediastinal contours are within normal limits. Both lungs are clear. The visualized skeletal structures are unremarkable. IMPRESSION: No active cardiopulmonary disease.   ASSESSMENT & PLAN:  1. Multiple myeloma, remission status unspecified (HCC)    #Light chain multiple myeloma beta 2 microglobulin 5 and normal albumin.  Stage II. cytogenetics showed normal male chromosome, MDS FISH panel showed trisomy 83- Standard Risk. S/p RVD x 9 and  Autologous bone marrow stem cell transplant at [facility] on 04/23/2019. He has not had post bone marrow transplant bone marrow biopsy yet.  Per [facility] transplant team, bone marrow biopsy will be considered if clinically indicated.  Labs reviewed and discussed with patient.  I counts acceptable to proceed with xazomab maintenance, 3 mg on day 1, day 8, day 15 out of 28 days.  # Chronic bilaterally lower extremity swelling, likely due to Ixazomib.  Continue use compression stocking. Bilateral lower extremity edema has improved. #Thrombocytopenia, platelet count 72,000, likely secondary to chemotherapy. I will repeat CBC in 2 weeks c.  Hold chemo if platelet drops below  30,000.  Chronic kidney disease, creatinine level is stable. Chronic anemia, hemoglobin stable at 12.1.  #Post bone marrow transplant care, Continue acyclovir 400 mg twice daily  #Bone health/osteopenia/multiple myeloma Continue Xgeva monthly.  Patient will proceed with Delton See today.  Continue calcium and vitamin D supplementation.  Follow up 4 weeks for evaluation prior to starting next cycle of chemotherapy for maintenance.  Earlie Server, MD, PhD Hematology Oncology [place] at [facility] 03/01/2020

## 2020-03-08 ENCOUNTER — Telehealth (INDEPENDENT_AMBULATORY_CARE_PROVIDER_SITE_OTHER): Payer: BC Managed Care – PPO | Admitting: Family Medicine

## 2020-03-08 ENCOUNTER — Encounter: Payer: Self-pay | Admitting: Family Medicine

## 2020-03-08 ENCOUNTER — Other Ambulatory Visit: Payer: Self-pay

## 2020-03-08 ENCOUNTER — Telehealth: Payer: Self-pay | Admitting: *Deleted

## 2020-03-08 DIAGNOSIS — R05 Cough: Secondary | ICD-10-CM | POA: Diagnosis not present

## 2020-03-08 DIAGNOSIS — R058 Other specified cough: Secondary | ICD-10-CM | POA: Insufficient documentation

## 2020-03-08 DIAGNOSIS — R059 Cough, unspecified: Secondary | ICD-10-CM | POA: Insufficient documentation

## 2020-03-08 MED ORDER — AZITHROMYCIN 250 MG PO TABS: ORAL_TABLET | ORAL | 0 refills | Status: DC

## 2020-03-08 MED ORDER — BENZONATATE 200 MG PO CAPS
200.0000 mg | ORAL_CAPSULE | Freq: Three times a day (TID) | ORAL | 0 refills | Status: DC | PRN
Start: 2020-03-08 — End: 2020-06-22

## 2020-03-08 MED ORDER — ALBUTEROL SULFATE HFA 108 (90 BASE) MCG/ACT IN AERS
2.0000 | INHALATION_SPRAY | RESPIRATORY_TRACT | 0 refills | Status: DC | PRN
Start: 2020-03-08 — End: 2020-04-02

## 2020-03-08 NOTE — Progress Notes (Signed)
Virtual Visit via Video Note  I connected with [name] on 03/08/20 at  2:00 PM EDT by a video enabled telemedicine application and verified that I am speaking with the correct person using two identifiers.  Location: Patient: home Provider: office   I discussed the limitations of evaluation and management by telemedicine and the availability of in person appointments. The patient expressed understanding and agreed to proceed.  Parties involved in encounter  Patient:Luis Burke   Provider:  Loura Pardon MD    History of Present Illness: 64 yo pt of Dr Damita Dunnings presents with c/o cough and congestion  Can't get rid of  2 weeks   He has multiple myeloma with bone marrow transplant   Immune system "is not totally back to normal"  Is seen at [facility]     Has not had a covid test   Had both of his shots for covid Has had flu vaccine   Nasal drainage is green Also coughing that up  A little wheezing but not sob   No facial pain or pressure   No fever or chills  Has had some body aches  No change in appetite  No loss of taste or smell    No sick contacts that he knows of  A few kids- not seemingly   otc meds calms it down a bit    Otc:  chlorcedin  Delsym     Patient Active Problem List   Diagnosis Date Noted  . Productive cough 03/08/2020  . Carotid arterial disease ([place]) 11/26/2019  . 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[place])   . Leg pain 03/03/2019  . AKI (acute kidney injury) ([place]) 01/06/2019  . Multiple myeloma ([place]) 08/26/2018  . Goals of care, counseling/discussion 08/26/2018  . Fatty liver 07/29/2018  . Anemia 07/06/2018  . Creatinine elevation 07/06/2018  . Headache 07/02/2018  . Snoring 06/10/2018  . FH: colon cancer 06/10/2018  . Advance care planning 11/24/2015  . Routine general medical examination at a health care facility 04/29/2012  . ESSENTIAL HYPERTENSION, BENIGN 01/20/2008  . HYPERLIPIDEMIA, WITH LOW HDL 06/02/2007  . 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 AKI (acute kidney injury) ([place]) 01/06/2019  . Anemia   . 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[place])    autologous stem cell bone marrow transplant on 04/23/2019.  Marland Kitchen Fatty liver    on u/s 07/2018  . Hyperlipidemia 03/2004  . Hypertension 1994  . Snores    Past Surgical History:  Procedure Laterality Date  . CATARACT EXTRACTION W/PHACO Right 03/04/2018   Procedure: CATARACT EXTRACTION PHACO AND INTRAOCULAR LENS PLACEMENT ([place])  RIGHT TORIC;  Surgeon: Leandrew Koyanagi, MD;  Location: [place];  Service: Ophthalmology;  Laterality: Right;  Per [place] no Toric Lens 1:45 5.3  . COLONOSCOPY WITH PROPOFOL N/A 07/20/2018   Procedure: COLONOSCOPY WITH PROPOFOL;  Surgeon: Jonathon Bellows, MD;  Location[facility] ENDOSCOPY;  Service: Gastroenterology;  Laterality: N/A;  . ESOPHAGOGASTRODUODENOSCOPY (EGD) WITH PROPOFOL N/A 07/20/2018   Procedure: ESOPHAGOGASTRODUODENOSCOPY (EGD) WITH PROPOFOL;  Surgeon: Jonathon Bellows, MD;  [facility] END[facility];  Service: Gastroenterology;  Laterality: N/A;  . EYE SURGERY Right   . HERNIA REPAIR    . L Lap herniorraphy  04/2000  . Left wrist ganglionectomy     Social History   Tobacco Use  . Smoking status: Never Smoker  . Smokeless tobacco: Never Used  . Tobacco comment: Occassionally  Substance Use Topics  . Alcohol use: Not Currently  Alcohol/week: 3.0 standard drinks    Types: 3 Cans of beer per week  . Drug use: No   Family History  Problem Relation Age of Onset  . Hypertension Mother   . Diabetes Mother   . Heart disease Mother        CAD  . Dementia Mother   . Hypertension Father   . Heart disease Father        MI 02/03  . Colon cancer Father   . Hypertension Sister   . Hypertension Sister   . Hypertension Sister   . Prostate cancer Neg Hx    Allergies  Allergen Reactions  . Bactrim [Sulfamethoxazole-Trimethoprim]     Elevated creatinine  . Clonidine  Derivatives     Low platelets, edema  . Tadalafil     Intolerant,  Didn't feel well on med   Current Outpatient Medications on File Prior to Visit  Medication Sig Dispense Refill  . acetaminophen (TYLENOL) 325 MG tablet Take 650 mg by mouth every 6 (six) hours as needed.    Marland Kitchen acyclovir (ZOVIRAX) 400 MG tablet Take 1 tablet (400 mg total) by mouth 2 (two) times daily. 60 tablet 11  . amLODipine (NORVASC) 5 MG tablet TAKE 1-2 TABLETS (5-10 MG TOTAL) BY MOUTH DAILY. 180 tablet 3  . clopidogrel (PLAVIX) 75 MG tablet Take by mouth.    . co-enzyme Q-10 50 MG capsule Take 50 mg by mouth daily.    . cyanocobalamin (CVS VITAMIN B12) 1000 MCG tablet Take 1 tablet (1,000 mcg total) by mouth daily. 90 tablet 3  . cyclobenzaprine (FLEXERIL) 5 MG tablet Take 1 tablet (5 mg total) by mouth 3 (three) times daily as needed for muscle spasms. 30 tablet 0  . famotidine (PEPCID) 20 MG tablet Take 20 mg by mouth daily.     . hydrALAZINE (APRESOLINE) 10 MG tablet TAKE 1 TABLET BY MOUTH THREE TIMES A DAY 90 tablet 5  . ixazomib citrate (NINLARO) 3 MG capsule Take 1 capsule (3 mg) by mouth weekly, 3 weeks on, 1 week off, repeat every 4 weeks. Take on an empty stomach 1hr before or 2hr after meals. 3 capsule 3  . lisinopril (ZESTRIL) 20 MG tablet Take 1 tablet (20 mg total) by mouth daily. 90 tablet 3  . LORazepam (ATIVAN) 1 MG tablet TAKE 1 TABLET (1 MG TOTAL) BY MOUTH EVERY 6 (SIX) HOURS AS NEEDED FOR ANXIETY (FOR NAUSEA)    . magnesium chloride (SLOW-MAG) 64 MG TBEC SR tablet Take 1 tablet (64 mg total) by mouth daily. 60 tablet 0  . metoprolol succinate (TOPROL-XL) 50 MG 24 hr tablet Take 4 tablets (200 mg total) by mouth daily. Take with or immediately following a meal. 360 tablet 1  . ondansetron (ZOFRAN) 8 MG tablet Take 8 mg by mouth every 8 (eight) hours as needed. for nausea    . pravastatin (PRAVACHOL) 10 MG tablet Take 2 tablets (20 mg total) by mouth daily.    Marland Kitchen zinc gluconate 50 MG tablet Take 50 mg by  mouth daily.    . prochlorperazine (COMPAZINE) 10 MG tablet TAKE 1 TABLET (10 MG TOTAL) BY MOUTH EVERY 6 (SIX) HOURS AS NEEDED FOR NAUSEA     No current facility-administered medications on file prior to visit.    Review of Systems  Constitutional: Negative for chills, fever and malaise/fatigue.       Is cold natured in general  HENT: Positive for congestion. Negative for ear pain, sinus pain and sore throat.  Pnd  Eyes: Negative for blurred vision, discharge and redness.  Respiratory: Positive for cough, sputum production and wheezing. Negative for shortness of breath and stridor.   Cardiovascular: Negative for chest pain, palpitations and leg swelling.  Gastrointestinal: Negative for abdominal pain, diarrhea, nausea and vomiting.  Musculoskeletal: Negative for myalgias.  Skin: Negative for rash.  Neurological: Negative for dizziness and headaches.    Observations/Objective: Patient appears well, in no distress Weight is baseline  No facial swelling or asymmetry Normal voice-not hoarse and no slurred speech No obvious tremor or mobility impairment Moving neck and UEs normally Able to hear the call well  No audible cough or wheeze or sob,  He does clear his throat occasionally  Talkative and mentally sharp with no cognitive changes No skin changes on face or neck , no rash or pallor Affect is normal    Assessment and Plan: Problem List Items Addressed This Visit      Other   Productive cough    With cold symptoms in immunocompromised pt  He has had covid vaccines and flu shot  Urged him to get tested for covid just to be sure and info given (and inst to call us with a result)   Sent in azithromycin to cover for com aq pneumonia  Albuterol inhaler for wheeze prn  Tessalon for cough prn  If cough continues-consider poss of ace cough as well Will drink fluids/rest and isolate inst to call if any new symptoms Update if not starting to improve in a week or if worsening             Follow Up Instructions: Drink fluids and rest  Get a covid test to be on the safe side and call us with a result   Take zpak as directed Use the inhaler as needed  Tessalon for cough Delsym is ok as well  If you need an expectorant- I like mucinex  Please call if you develop new symptoms (especially fever or shortness of breath)  Update if not starting to improve in a week or if worsening    I discussed the assessment and treatment plan with the patient. The patient was provided an opportunity to ask questions and all were answered. The patient agreed with the plan and demonstrated an understanding of the instructions.   The patient was advised to call back or seek an in-person evaluation if the symptoms worsen or if the condition fails to improve as anticipated.     Luis Pardon, MD

## 2020-03-08 NOTE — Patient Instructions (Addendum)
Drink fluids and rest  Get a covid test to be on the safe side and call us with a result   Take zpak as directed Use the inhaler as needed  Tessalon for cough Delsym is ok as well  If you need an expectorant- I like mucinex  Please call if you develop new symptoms (especially fever or shortness of breath)  Update if not starting to improve in a week or if worsening   How to Use a Metered Dose Inhaler A metered dose inhaler is a handheld device for taking medicine that must be breathed into the lungs (inhaled). The device can be used to deliver a variety of inhaled medicines, including:  Quick relief or rescue medicines, such as bronchodilators.  Controller medicines, such as corticosteroids. The medicine is delivered by pushing down on a metal canister to release a preset amount of spray and medicine. Each device contains the amount of medicine that is needed for a preset number of uses (inhalations). Your health care provider may recommend that you use a spacer with your inhaler to help you take the medicine more effectively. A spacer is a plastic tube with a mouthpiece on one end and an opening that connects to the inhaler on the other end. A spacer holds the medicine in a tube for a short time, which allows you to inhale more medicine. What are the risks? If you do not use your inhaler correctly, medicine might not reach your lungs to help you breathe. Inhaler medicine can cause side effects, such as:  Mouth or throat infection.  Cough.  Hoarseness.  Headache.  Nausea and vomiting.  Lung infection (pneumonia) in people who have a lung condition called COPD. How to use a metered dose inhaler without a spacer  1. Remove the cap from the inhaler. 2. If you are using the inhaler for the first time, shake it for 5 seconds, turn it away from your face, then release 4 puffs into the air. This is called priming. 3. Shake the inhaler for 5 seconds. 4. Position the inhaler so the top  of the canister faces up. 5. Put your index finger on the top of the medicine canister. Support the bottom of the inhaler with your thumb. 6. Breathe out normally and as completely as possible, away from the inhaler. 7. Either place the inhaler between your teeth and close your lips tightly around the mouthpiece, or hold the inhaler 1-2 inches (2.5-5 cm) away from your open mouth. Keep your tongue down out of the way. If you are unsure which technique to use, ask your health care provider. 8. Press the canister down with your index finger to release the medicine, then inhale deeply and slowly through your mouth (not your nose) until your lungs are completely filled. Inhaling should take 4-6 seconds. 9. Hold the medicine in your lungs for 5-10 seconds (10 seconds is best). This helps the medicine get into the small airways of your lungs. 10. With your lips in a tight circle (pursed), breathe out slowly. 11. Repeat steps 3-10 until you have taken the number of puffs that your health care provider directed. Wait about 1 minute between puffs or as directed. 12. Put the cap on the inhaler. 13. If you are using a steroid inhaler, rinse your mouth with water, gargle, and spit out the water. Do not swallow the water. How to use a metered dose inhaler with a spacer  1. Remove the cap from the inhaler. 2. If you  are using the inhaler for the first time, shake it for 5 seconds, turn it away from your face, then release 4 puffs into the air. This is called priming. 3. Shake the inhaler for 5 seconds. 4. Place the open end of the spacer onto the inhaler mouthpiece. 5. Position the inhaler so the top of the canister faces up and the spacer mouthpiece faces you. 6. Put your index finger on the top of the medicine canister. Support the bottom of the inhaler and the spacer with your thumb. 7. Breathe out normally and as completely as possible, away from the spacer. 8. Place the spacer between your teeth and close  your lips tightly around it. Keep your tongue down out of the way. 9. Press the canister down with your index finger to release the medicine, then inhale deeply and slowly through your mouth (not your nose) until your lungs are completely filled. Inhaling should take 4-6 seconds. 10. Hold the medicine in your lungs for 5-10 seconds (10 seconds is best). This helps the medicine get into the small airways of your lungs. 11. With your lips in a tight circle (pursed), breathe out slowly. 12. Repeat steps 3-11 until you have taken the number of puffs that your health care provider directed. Wait about 1 minute between puffs or as directed. 13. Remove the spacer from the inhaler and put the cap on the inhaler. 14. If you are using a steroid inhaler, rinse your mouth with water, gargle, and spit out the water. Do not swallow the water. Follow these instructions at home:  Take your inhaled medicine only as told by your health care provider. Do not use the inhaler more than directed by your health care provider.  Keep all follow-up visits as told by your health care provider. This is important.  If your inhaler has a counter, you can check it to determine how full your inhaler is. If your inhaler does not have a counter, ask your health care provider when you will need to refill your inhaler and write the refill date on a calendar or on your inhaler canister. Note that you cannot know when an inhaler is empty by shaking it.  Follow directions on the package insert for care and cleaning of your inhaler and spacer. Contact a health care provider if:  Symptoms are only partially relieved with your inhaler.  You are having trouble using your inhaler.  You have an increase in phlegm.  You have headaches. Get help right away if:  You feel little or no relief after using your inhaler.  You have dizziness.  You have a fast heart rate.  You have chills or a fever.  You have night sweats.  There is  blood in your phlegm. Summary  A metered dose inhaler is a handheld device for taking medicine that must be breathed into the lungs (inhaled).  The medicine is delivered by pushing down on a metal canister to release a preset amount of spray and medicine.  Each device contains the amount of medicine that is needed for a preset number of uses (inhalations). This information is not intended to replace advice given to you by your health care provider. Make sure you discuss any questions you have with your health care provider. Document Revised: 09/12/2017 Document Reviewed: 08/20/2016 Elsevier Patient Education  2020 [employer].

## 2020-03-08 NOTE — Assessment & Plan Note (Signed)
With cold symptoms in immunocompromised pt  He has had covid vaccines and flu shot  Urged him to get tested for covid just to be sure and info given (and inst to call us with a result)   Sent in azithromycin to cover for com aq pneumonia  Albuterol inhaler for wheeze prn  Tessalon for cough prn  If cough continues-consider poss of ace cough as well Will drink fluids/rest and isolate inst to call if any new symptoms Update if not starting to improve in a week or if worsening

## 2020-03-08 NOTE — Telephone Encounter (Signed)
Patient called requesting that something be called in for his cold. Patient stated that he has had a cold for about 12 days now. Patient stated that he has a cough and congestion. Patient stated that he has a low immune symptoms and is not able to get rid of the cold. Patient stated that he does not have a fever, but has had some body aches, chills and a productive cough that is green at times. Patient stated that he has been taking OTC cough medications.  Patient scheduled for a virtual visit with Dr. Glori Bickers tomorrow 03/09/20 at 10:15 am because Dr. Damita Dunnings does not have any appointments available. Patient was given ER precautions and he verbalized understanding. Patient was advised to rest, drink plenty of fluids and eat well balanced meals.

## 2020-03-08 NOTE — Telephone Encounter (Signed)
Aware Will see him then

## 2020-03-09 ENCOUNTER — Telehealth: Payer: BC Managed Care – PPO | Admitting: Family Medicine

## 2020-03-09 DIAGNOSIS — Z20822 Contact with and (suspected) exposure to covid-19: Secondary | ICD-10-CM | POA: Diagnosis not present

## 2020-03-09 DIAGNOSIS — Z20828 Contact with and (suspected) exposure to other viral communicable diseases: Secondary | ICD-10-CM | POA: Diagnosis not present

## 2020-03-16 ENCOUNTER — Inpatient Hospital Stay: Payer: BC Managed Care – PPO | Attending: Oncology

## 2020-03-16 ENCOUNTER — Other Ambulatory Visit: Payer: Self-pay

## 2020-03-16 ENCOUNTER — Ambulatory Visit: Payer: BC Managed Care – PPO

## 2020-03-16 ENCOUNTER — Ambulatory Visit: Payer: BC Managed Care – PPO | Admitting: Oncology

## 2020-03-16 ENCOUNTER — Other Ambulatory Visit: Payer: BC Managed Care – PPO

## 2020-03-16 DIAGNOSIS — C9000 Multiple myeloma not having achieved remission: Secondary | ICD-10-CM | POA: Diagnosis not present

## 2020-03-16 LAB — CBC WITH DIFFERENTIAL/PLATELET
Abs Immature Granulocytes: 0.03 10*3/uL (ref 0.00–0.07)
Basophils Absolute: 0 10*3/uL (ref 0.0–0.1)
Basophils Relative: 0 %
Eosinophils Absolute: 0 10*3/uL (ref 0.0–0.5)
Eosinophils Relative: 1 %
HCT: 34.5 % — ABNORMAL LOW (ref 39.0–52.0)
Hemoglobin: 12.1 g/dL — ABNORMAL LOW (ref 13.0–17.0)
Immature Granulocytes: 1 %
Lymphocytes Relative: 31 %
Lymphs Abs: 1.6 10*3/uL (ref 0.7–4.0)
MCH: 34 pg (ref 26.0–34.0)
MCHC: 35.1 g/dL (ref 30.0–36.0)
MCV: 96.9 fL (ref 80.0–100.0)
Monocytes Absolute: 0.6 10*3/uL (ref 0.1–1.0)
Monocytes Relative: 10 %
Neutro Abs: 3.1 10*3/uL (ref 1.7–7.7)
Neutrophils Relative %: 57 %
Platelets: 75 10*3/uL — ABNORMAL LOW (ref 150–400)
RBC: 3.56 MIL/uL — ABNORMAL LOW (ref 4.22–5.81)
RDW: 13.2 % (ref 11.5–15.5)
WBC: 5.4 10*3/uL (ref 4.0–10.5)
nRBC: 0 % (ref 0.0–0.2)

## 2020-03-17 LAB — KAPPA/LAMBDA LIGHT CHAINS
Kappa free light chain: 39.2 mg/L — ABNORMAL HIGH (ref 3.3–19.4)
Kappa, lambda light chain ratio: 2.9 — ABNORMAL HIGH (ref 0.26–1.65)
Lambda free light chains: 13.5 mg/L (ref 5.7–26.3)

## 2020-03-23 ENCOUNTER — Other Ambulatory Visit: Payer: BC Managed Care – PPO

## 2020-03-27 ENCOUNTER — Telehealth: Payer: Self-pay | Admitting: *Deleted

## 2020-03-27 NOTE — Telephone Encounter (Signed)
Patient called reporting that he has an appointment at [facility] for lab and to see Dr Maylene Roes, he also has an appointment with Dr Tasia Catchings on Thursday for lab, physician, Injection and is asking if he needs the lab appointment with is Thursday  Since he will have labs at [facility] on Wednesday. He asks that we call [facility] to have any labs we are ordering drawn while he is there. Please return his call with response [phone]

## 2020-03-28 NOTE — Telephone Encounter (Signed)
Patient will have CBC,CMP done at [facility] when he see's Dr. Maylene Roes on 6/16. Per Dr. Tasia Catchings, since he is seeing Dr. Maylene Roes, there is no need for follow up with her. Please cancel lab/MD on 6/17. KEEP Xgeva injection on 6/17 (we will use lab results from [place] in care everywhere).    Please schedule patient for lab/MD/ Xgeva in 1 month & notify pt of appt changes. Thanks

## 2020-03-29 DIAGNOSIS — Z79899 Other long term (current) drug therapy: Secondary | ICD-10-CM | POA: Diagnosis not present

## 2020-03-29 DIAGNOSIS — Z23 Encounter for immunization: Secondary | ICD-10-CM | POA: Diagnosis not present

## 2020-03-29 DIAGNOSIS — Z9481 Bone marrow transplant status: Secondary | ICD-10-CM | POA: Diagnosis not present

## 2020-03-29 DIAGNOSIS — Z9484 Stem cells transplant status: Secondary | ICD-10-CM | POA: Diagnosis not present

## 2020-03-29 DIAGNOSIS — C9000 Multiple myeloma not having achieved remission: Secondary | ICD-10-CM | POA: Diagnosis not present

## 2020-03-29 NOTE — Telephone Encounter (Signed)
Done. 03/30/20 lab/MD appts were cx as requested RTC in 1 mth for lab/MD/Xgeva Inj (scheduled) Pt is aware of his scheduled appts.

## 2020-03-30 ENCOUNTER — Other Ambulatory Visit: Payer: Self-pay | Admitting: Oncology

## 2020-03-30 ENCOUNTER — Inpatient Hospital Stay: Payer: BC Managed Care – PPO

## 2020-03-30 ENCOUNTER — Other Ambulatory Visit: Payer: Self-pay

## 2020-03-30 ENCOUNTER — Encounter: Payer: Self-pay | Admitting: Oncology

## 2020-03-30 ENCOUNTER — Inpatient Hospital Stay: Payer: BC Managed Care – PPO | Admitting: Oncology

## 2020-03-30 DIAGNOSIS — C9000 Multiple myeloma not having achieved remission: Secondary | ICD-10-CM | POA: Diagnosis not present

## 2020-03-30 DIAGNOSIS — N179 Acute kidney failure, unspecified: Secondary | ICD-10-CM

## 2020-03-30 MED ORDER — DENOSUMAB 120 MG/1.7ML ~~LOC~~ SOLN
120.0000 mg | Freq: Once | SUBCUTANEOUS | Status: AC
  Administered 2020-03-30: 120 mg via SUBCUTANEOUS
  Filled 2020-03-30: qty 1.7

## 2020-03-31 ENCOUNTER — Other Ambulatory Visit: Payer: Self-pay | Admitting: Family Medicine

## 2020-03-31 NOTE — Telephone Encounter (Signed)
Done.. 04/28/20 Appts has been changed per pt request to 04/27/20 Labs @ 10:15 MD @ 10:45 and [place] See Inj @ 11:15

## 2020-04-01 NOTE — Telephone Encounter (Signed)
You saw pt for an acute on 5/26 for cough... please advise if appropriate to refill

## 2020-04-02 NOTE — Telephone Encounter (Signed)
Okay to use prn. Please get update on patient.  If needed frequently, then needs recheck.  Thanks.

## 2020-04-25 ENCOUNTER — Other Ambulatory Visit: Payer: Self-pay

## 2020-04-25 ENCOUNTER — Telehealth: Payer: Self-pay | Admitting: *Deleted

## 2020-04-25 DIAGNOSIS — C9000 Multiple myeloma not having achieved remission: Secondary | ICD-10-CM

## 2020-04-25 MED ORDER — IXAZOMIB CITRATE 3 MG PO CAPS: ORAL_CAPSULE | ORAL | 3 refills | Status: DC

## 2020-04-25 NOTE — Progress Notes (Signed)
error 

## 2020-04-25 NOTE — Telephone Encounter (Signed)
Patient called requesting that MD office contact his pharmacy regarding a renewal for his chemotherapy. He states that the pharmacy has tried contacting the office but has not gotten a response. The Pharmacy is [facility] (sp). He states he is supposed to resume therapy this Thursday.

## 2020-04-25 NOTE — Telephone Encounter (Signed)
Refill request sent to MD.

## 2020-04-27 ENCOUNTER — Inpatient Hospital Stay (HOSPITAL_BASED_OUTPATIENT_CLINIC_OR_DEPARTMENT_OTHER): Payer: BC Managed Care – PPO | Admitting: Oncology

## 2020-04-27 ENCOUNTER — Encounter: Payer: Self-pay | Admitting: Oncology

## 2020-04-27 ENCOUNTER — Other Ambulatory Visit: Payer: Self-pay

## 2020-04-27 ENCOUNTER — Inpatient Hospital Stay: Payer: BC Managed Care – PPO

## 2020-04-27 ENCOUNTER — Inpatient Hospital Stay: Payer: BC Managed Care – PPO | Attending: Oncology

## 2020-04-27 VITALS — BP 142/95 | HR 62 | Temp 96.7°F | Resp 18 | Wt 206.7 lb

## 2020-04-27 DIAGNOSIS — D696 Thrombocytopenia, unspecified: Secondary | ICD-10-CM | POA: Diagnosis not present

## 2020-04-27 DIAGNOSIS — N179 Acute kidney failure, unspecified: Secondary | ICD-10-CM

## 2020-04-27 DIAGNOSIS — C9000 Multiple myeloma not having achieved remission: Secondary | ICD-10-CM | POA: Diagnosis not present

## 2020-04-27 DIAGNOSIS — Z5111 Encounter for antineoplastic chemotherapy: Secondary | ICD-10-CM

## 2020-04-27 LAB — COMPREHENSIVE METABOLIC PANEL
ALT: 24 U/L (ref 0–44)
AST: 24 U/L (ref 15–41)
Albumin: 4.7 g/dL (ref 3.5–5.0)
Alkaline Phosphatase: 62 U/L (ref 38–126)
Anion gap: 9 (ref 5–15)
BUN: 23 mg/dL (ref 8–23)
CO2: 26 mmol/L (ref 22–32)
Calcium: 9.6 mg/dL (ref 8.9–10.3)
Chloride: 103 mmol/L (ref 98–111)
Creatinine, Ser: 1.41 mg/dL — ABNORMAL HIGH (ref 0.61–1.24)
GFR calc Af Amer: >60 mL/min (ref >60)
GFR calc non Af Amer: 52 mL/min — ABNORMAL LOW (ref >60)
Glucose, Bld: 103 mg/dL — ABNORMAL HIGH (ref 70–99)
Potassium: 4 mmol/L (ref 3.5–5.1)
Sodium: 138 mmol/L (ref 135–145)
Total Bilirubin: 0.9 mg/dL (ref 0.3–1.2)
Total Protein: 7.8 g/dL (ref 6.5–8.1)

## 2020-04-27 LAB — CBC WITH DIFFERENTIAL/PLATELET
Abs Immature Granulocytes: 0.01 10*3/uL (ref 0.00–0.07)
Basophils Absolute: 0 10*3/uL (ref 0.0–0.1)
Basophils Relative: 0 %
Eosinophils Absolute: 0.1 10*3/uL (ref 0.0–0.5)
Eosinophils Relative: 1 %
HCT: 36.4 % — ABNORMAL LOW (ref 39.0–52.0)
Hemoglobin: 12.9 g/dL — ABNORMAL LOW (ref 13.0–17.0)
Immature Granulocytes: 0 %
Lymphocytes Relative: 32 %
Lymphs Abs: 1.4 10*3/uL (ref 0.7–4.0)
MCH: 34.3 pg — ABNORMAL HIGH (ref 26.0–34.0)
MCHC: 35.4 g/dL (ref 30.0–36.0)
MCV: 96.8 fL (ref 80.0–100.0)
Monocytes Absolute: 0.4 10*3/uL (ref 0.1–1.0)
Monocytes Relative: 9 %
Neutro Abs: 2.6 10*3/uL (ref 1.7–7.7)
Neutrophils Relative %: 58 %
Platelets: 71 10*3/uL — ABNORMAL LOW (ref 150–400)
RBC: 3.76 MIL/uL — ABNORMAL LOW (ref 4.22–5.81)
RDW: 13.6 % (ref 11.5–15.5)
WBC: 4.5 10*3/uL (ref 4.0–10.5)
nRBC: 0 % (ref 0.0–0.2)

## 2020-04-27 MED ORDER — DENOSUMAB 120 MG/1.7ML ~~LOC~~ SOLN
120.0000 mg | Freq: Once | SUBCUTANEOUS | Status: AC
  Administered 2020-04-27: 120 mg via SUBCUTANEOUS
  Filled 2020-04-27: qty 1.7

## 2020-04-27 NOTE — Progress Notes (Signed)
Patient denies new problems/concerns today.   °

## 2020-04-27 NOTE — Progress Notes (Signed)
Hematology/Oncology  Follow up note [facility] Telephone:([phone]) [phone] Fax:(336) [phone]   Patient Care Team: Tonia Ghent, MD as PCP - General (Family Medicine) Earlie Server, MD as Consulting Physician (Oncology) Beather Arbour Lilyan Punt, MD as Referring Physician (Hematology and Oncology) Diannia Ruder, MD as Referring Physician (Hematology and Oncology)  REASON FOR VISIT Follow up for multiple myeloma.   HISTORY OF PRESENTING ILLNESS:  Luis Burke is a  64 y.o.  male with PMH listed below presents for follow up of multiple myeloma.  # 07/27/2018 multiple myeloma panel showed M protein of 0.1, IgG 662, IgA 49, IgM 7. Patient was called back to further labs done. 08/10/2018, free light chain ratio showed extremely high level of kappa free light chain 10,183, with a kappa lambda light chain ratio of 1414.31 LDH 164 Beta-2 microglobulin 5 Patient was called and discuss about results.  He was recommended to undergo bone marrow biopsy and PET scan. 08/19/2018 bone marrow biopsy showed hypercellular marrow 80%, involved by plasma cell neoplasm up to 95%.  Consistent with plasma cell myeloma.  FISH and cytogenetics are pending.  09/10/2018 skeletal survey showed questionable small lucent lesions within the midshaft of the humerus bilaterally.  Otherwise no suspicious focal lytic lesion or acute bone abnormality. 08/25/2018 PET scan showed no definite hypermetabolic bone disease but CT findings are highly suspicious for numerous small myelomatous lesions involving the spine, sternum and scattered ribs.  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 He received preparative regimen with melphalan 200 mg/m on 04/22/2019 followed by autologous stem cell infusion on 04/23/2019. Currently he is transfusion independent.  Transfusion criteria with as needed hemoglobin less than 7.5 for platelet less than 10,000.  Transplant course was complicated with  febrile neutropenia grade 3, treated with vancomycin and cephapirin until engraftment on 05/06/2019. Patient also had engraftment syndrome and had to be started on Solu-Medrol 25 mg twice daily on 05/05/2019.  Transition to Medrol Dosepak on 05/07/2019 to complete steroid taper as outpatient.  Patient is currently on acyclovir for 1 year after transplant, dose was reduced on 722 11/20/2018 due to renal function. Patient had a baseline CKD with creatinine running between 1.4-1.6.  He had acute on chronic kidney injury and creatinine went up to 2.2 on 722.  Fluid hydration was given and creatinine decreased to baseline 1.5-1.7. His Hickman catheter was removed.  Candida Cruris treated with topical clotrimazole 1% 3 times daily and to resolution..  #  seen by Dr. Tonia Brooms BMT team on 06/30/2019.revaccination at [place] begins at 12 months post transplant I discussed with [facility] hematology Dr.Choi and he recommends plans as listed below.  -patient to be started on Ixazomib maintenance: -Ixazomib 90m on D1/D8/D15 out of 28 day cycle.  -patient will start re-vaccination 12 months post transplant.  -Post transplant bone marrow biopsy if clinically indicated.   INTERVAL HISTORY Luis KIMBERLINGis a 65y.o. 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 #Patient is currently on ixizomab 3 mg on day 1, day 8, day 15 out of 28 days. Patient was seen by [facility] bone marrow transplant team in June 2021.  Patient has been started on immunization protocol.  He reports no new complaints.  He is due to start next cycle of treatments. . .   Review of Systems  Constitutional: Negative for appetite change, chills, fatigue, fever and unexpected weight change.  HENT:  Negative for hearing loss and voice change.   Eyes: Negative for eye problems and icterus.  Respiratory: Negative for chest tightness, cough and shortness of breath.   Cardiovascular: Negative for  chest pain and leg swelling.  Gastrointestinal: Negative for abdominal distention and abdominal pain.  Endocrine: Negative for hot flashes.  Genitourinary: Negative for difficulty urinating, dysuria and frequency.   Musculoskeletal: Negative for arthralgias.  Skin: Negative for itching and rash.  Neurological: Negative for light-headedness and numbness.  Hematological: Negative for adenopathy. Does not bruise/bleed easily.  Psychiatric/Behavioral: Negative for confusion.    MEDICAL HISTORY:  Past Medical History:  Diagnosis Date   AKI (acute kidney injury) ([place]) 01/06/2019   Anemia    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[place])    autologous stem cell bone marrow transplant on 04/23/2019.   Fatty liver    on u/s 07/2018   Hyperlipidemia 03/2004   Hypertension 1994   Snores     SURGICAL HISTORY: Past Surgical History:  Procedure Laterality Date   CATARACT EXTRACTION W/PHACO Right 03/04/2018   Procedure: CATARACT EXTRACTION PHACO AND INTRAOCULAR LENS PLACEMENT ([place])  RIGHT TORIC;  Surgeon: Leandrew Koyanagi, MD;  Location: [place];  Service: Ophthalmology;  Laterality: Right;  Per [place] no Toric Lens 1:45 5.3   COLONOSCOPY WITH PROPOFOL N/A 07/20/2018   Procedure: COLONOSCOPY WITH PROPOFOL;  Surgeon: Jonathon Bellows, MD;  Location[facility] ENDOSCOPY;  Service: Gastroenterology;  Laterality: N/A;   ESOPHAGOGASTRODUODENOSCOPY (EGD) WITH PROPOFOL N/A 07/20/2018   Procedure: ESOPHAGOGASTRODUODENOSCOPY (EGD) WITH PROPOFOL;  Surgeon: Jonathon Bellows, MD;  Location: [facility] ENDOSCOPY;  Service: Gastroenterology;  Laterality: N/A;   EYE SURGERY Right    HERNIA REPAIR     L Lap herniorraphy  04/2000   Left wrist ganglionectomy      SOCIAL HISTORY: Social History   Socioeconomic History   Marital status: Single    Spouse name: Not on file   Number of children: 1   Years of education: Not on file   Highest education level: Not on file  Occupational History   Occupation:  [employer]  Tobacco Use   Smoking status: Never Smoker   Smokeless tobacco: Never Used   Tobacco comment: Occassionally  Vaping Use   Vaping Use: Never used  Substance and Sexual Activity   Alcohol use: Not Currently    Alcohol/week: 3.0 standard drinks    Types: 3 Cans of beer per week   Drug use: No   Sexual activity: Not on file  Other Topics Concern   Not on file  Social History Narrative   Divorced 12-20-07, dating as of December 19, 2017   His daughter died 4 days after giving birth to the patient's granddaughter   Has joint custody of his dead daughter's child   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 Strain:    Difficulty of Paying Living Expenses:   Food Insecurity:    Worried About Charity fundraiser in the Last Year:    Arboriculturist in the Last Year:   Transportation Needs:    Film/video editor (Medical):    Lack of Transportation (Non-Medical):   Physical Activity:    Days of Exercise per Week:    Minutes of Exercise per Session:   Stress:    Feeling of Stress :   Social Connections:    Frequency of Communication with Friends and Family:    Frequency of Social Gatherings with Friends and Family:  Attends Religious Services:    Active Member of Clubs or Organizations:    Attends Music therapist:    Marital Status:   Intimate Partner Violence:    Fear of Current or Ex-Partner:    Emotionally Abused:    Physically Abused:    Sexually Abused:     FAMILY HISTORY: Family History  Problem Relation Age of Onset   Hypertension Mother    Diabetes Mother    Heart disease Mother        CAD   Dementia Mother    Hypertension Father    Heart disease Father        MI 02/03   Colon cancer Father    Hypertension Sister    Hypertension Sister    Hypertension Sister    Prostate cancer Neg Hx     ALLERGIES:  is allergic to bactrim  [sulfamethoxazole-trimethoprim], clonidine derivatives, and tadalafil.  MEDICATIONS:  Current Outpatient Medications  Medication Sig Dispense Refill   acetaminophen (TYLENOL) 325 MG tablet Take 650 mg by mouth every 6 (six) hours as needed.     acyclovir (ZOVIRAX) 400 MG tablet Take 1 tablet (400 mg total) by mouth 2 (two) times daily. 60 tablet 11   albuterol (VENTOLIN HFA) 108 (90 Base) MCG/ACT inhaler Inhale 1-2 puffs into the lungs every 6 (six) hours as needed (for cough). 18 g 0   amLODipine (NORVASC) 5 MG tablet TAKE 1-2 TABLETS (5-10 MG TOTAL) BY MOUTH DAILY. 180 tablet 3   azithromycin (ZITHROMAX Z-PAK) 250 MG tablet Take 2 pills by mouth today and then 1 pill daily for 4 days 6 tablet 0   benzonatate (TESSALON) 200 MG capsule Take 1 capsule (200 mg total) by mouth 3 (three) times daily as needed for cough. 30 capsule 0   clopidogrel (PLAVIX) 75 MG tablet Take by mouth.     co-enzyme Q-10 50 MG capsule Take 50 mg by mouth daily.     cyanocobalamin ([employer] VITAMIN B12) 1000 MCG tablet Take 1 tablet (1,000 mcg total) by mouth daily. 90 tablet 3   cyclobenzaprine (FLEXERIL) 5 MG tablet Take 1 tablet (5 mg total) by mouth 3 (three) times daily as needed for muscle spasms. 30 tablet 0   famotidine (PEPCID) 20 MG tablet Take 20 mg by mouth daily.      hydrALAZINE (APRESOLINE) 10 MG tablet TAKE 1 TABLET BY MOUTH THREE TIMES A DAY 90 tablet 5   ixazomib citrate (NINLARO) 3 MG capsule Take 1 capsule (3 mg) by mouth weekly, 3 weeks on, 1 week off, repeat every 4 weeks. Take on an empty stomach 1hr before or 2hr after meals. 3 capsule 3   lisinopril (ZESTRIL) 20 MG tablet Take 1 tablet (20 mg total) by mouth daily. 90 tablet 3   LORazepam (ATIVAN) 1 MG tablet TAKE 1 TABLET (1 MG TOTAL) BY MOUTH EVERY 6 (SIX) HOURS AS NEEDED FOR ANXIETY (FOR NAUSEA)     magnesium chloride (SLOW-MAG) 64 MG TBEC SR tablet Take 1 tablet (64 mg total) by mouth daily. 60 tablet 0   metoprolol succinate  (TOPROL-XL) 50 MG 24 hr tablet Take 4 tablets (200 mg total) by mouth daily. Take with or immediately following a meal. 360 tablet 1   ondansetron (ZOFRAN) 8 MG tablet Take 8 mg by mouth every 8 (eight) hours as needed. for nausea     pravastatin (PRAVACHOL) 10 MG tablet Take 2 tablets (20 mg total) by mouth daily.     prochlorperazine (COMPAZINE) 10  MG tablet TAKE 1 TABLET (10 MG TOTAL) BY MOUTH EVERY 6 (SIX) HOURS AS NEEDED FOR NAUSEA     zinc gluconate 50 MG tablet Take 50 mg by mouth daily.     No current facility-administered medications for this visit.     PHYSICAL EXAMINATION: ECOG PERFORMANCE STATUS: 1 - Symptomatic but completely ambulatory Vitals:   04/27/20 1053  BP: (!) 142/95  Pulse: 62  Resp: 18  Temp: (!) 96.7 F (35.9 C)   Filed Weights   04/27/20 1053  Weight: 206 lb 11.2 oz (93.8 kg)    Physical Exam Constitutional:      General: He is not in acute distress. HENT:     Head: Normocephalic and atraumatic.  Eyes:     General: No scleral icterus. Cardiovascular:     Rate and Rhythm: Normal rate and regular rhythm.     Heart sounds: Normal heart sounds.  Pulmonary:     Effort: Pulmonary effort is normal. No respiratory distress.     Breath sounds: No wheezing.  Abdominal:     General: Bowel sounds are normal. There is no distension.     Palpations: Abdomen is soft.  Musculoskeletal:        General: No deformity. Normal range of motion.     Cervical back: Normal range of motion and neck supple.  Skin:    General: Skin is warm and dry.     Findings: No erythema or rash.  Neurological:     Mental Status: He is alert and oriented to person, place, and time. Mental status is at baseline.     Cranial Nerves: No cranial nerve deficit.     Coordination: Coordination normal.  Psychiatric:        Mood and Affect: Mood normal.      LABORATORY DATA:  I have reviewed the data as listed Lab Results  Component Value Date   WBC 5.4 03/16/2020   HGB 12.1  (L) 03/16/2020   HCT 34.5 (L) 03/16/2020   MCV 96.9 03/16/2020   PLT 75 (L) 03/16/2020   Recent Labs    01/27/20 0943 02/02/20 0839 03/01/20 0939  NA 139 139 141  K 4.1 4.5 4.4  CL 104 104 105  CO2 26 26 27   GLUCOSE 104* 111* 106*  BUN 22 24* 18  CREATININE 1.38* 1.44* 1.35*  CALCIUM 9.6 9.6 9.4  GFRNONAA 54* 51* 55*  GFRAA >60 59* >60  PROT 7.4 7.2 7.4  ALBUMIN 4.7 4.4 4.5  AST 22 21 25   ALT 24 22 30   ALKPHOS 72 68 71  BILITOT 0.8 0.7 0.7   Iron/TIBC/Ferritin/ %Sat    Component Value Date/Time   IRON 74 07/01/2018 0944   TIBC 250 07/01/2018 0944   FERRITIN 357 07/01/2018 0944   IRONPCTSAT 30 07/01/2018 0944    Lab Results  Component Value Date   TOTALPROTELP 6.8 01/27/2020   ALBUMINELP 3.7 03/10/2019   A1GS 0.3 03/10/2019   A2GS 0.8 03/10/2019   BETS 1.3 03/10/2019   GAMS 0.6 03/10/2019   MSPIKE Not Observed 03/10/2019   SPEI Comment 03/10/2019   Lab Results  Component Value Date   KPAFRELGTCHN 39.2 (H) 03/16/2020   LAMBDASER 13.5 03/16/2020   KAPLAMBRATIO 2.90 (H) 03/16/2020    RADIOGRAPHIC STUDIES: I have personally reviewed the radiological images as listed and agreed with the findings in the report. 10/23/2018 FINDINGS: The heart size and mediastinal contours are within normal limits. Both lungs are clear. The visualized skeletal structures are unremarkable.  IMPRESSION: No active cardiopulmonary disease.   ASSESSMENT & PLAN:  1. Multiple myeloma, remission status unspecified ([place])   2. Thrombocytopenia ([place])   3. Encounter for antineoplastic chemotherapy    #Light chain multiple myeloma beta 2 microglobulin 5 and normal albumin.  Stage II. 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 S/p RVD x 9 and  Autologous bone marrow stem cell transplant at [facility] on 04/23/2019. He has not had post bone marrow transplant bone marrow biopsy yet.  Per [facility] transplant team, bone marrow biopsy will be  considered if clinically indicated.  Labs are reviewed and discussed with the patient. His counts are stable.  Proceed with next cycle of ixizomab 3 mg on day 1, day 8, day 15 out of 28 days.  # Chronic bilaterally lower extremity swelling, likely due to Ixazomib.  Continue compression stocking.   Bilateral lower extremity edema has improved. #Thrombocytopenia, platelet count 71,000,  Chronic kidney disease, creatinine is stable. Chronic anemia, hemoglobin stable at 12.9.  #Post bone marrow transplant care, Continue acyclovir 400 mg twice daily  #Bone health/osteopenia/multiple myeloma Continue Xgeva monthly for total of 2 years..  Proceed with Xgeva.  Continue vitamin D and calcium supplementation.  Follow up 4 weeks for evaluation prior to starting next cycle of chemotherapy for maintenance.  Earlie Server, MD, PhD Hematology Oncology [place] at [facility] 04/27/2020

## 2020-04-28 ENCOUNTER — Ambulatory Visit: Payer: BC Managed Care – PPO

## 2020-04-28 ENCOUNTER — Other Ambulatory Visit: Payer: BC Managed Care – PPO

## 2020-04-28 ENCOUNTER — Ambulatory Visit: Payer: BC Managed Care – PPO | Admitting: Oncology

## 2020-04-28 LAB — KAPPA/LAMBDA LIGHT CHAINS
Kappa free light chain: 37.8 mg/L — ABNORMAL HIGH (ref 3.3–19.4)
Kappa, lambda light chain ratio: 2.76 — ABNORMAL HIGH (ref 0.26–1.65)
Lambda free light chains: 13.7 mg/L (ref 5.7–26.3)

## 2020-05-01 LAB — MULTIPLE MYELOMA PANEL, SERUM
Albumin SerPl Elph-Mcnc: 4 g/dL (ref 2.9–4.4)
Albumin/Glob SerPl: 1.4 (ref 0.7–1.7)
Alpha 1: 0.2 g/dL (ref 0.0–0.4)
Alpha2 Glob SerPl Elph-Mcnc: 0.6 g/dL (ref 0.4–1.0)
B-Globulin SerPl Elph-Mcnc: 1 g/dL (ref 0.7–1.3)
Gamma Glob SerPl Elph-Mcnc: 1.1 g/dL (ref 0.4–1.8)
Globulin, Total: 3 g/dL (ref 2.2–3.9)
IgA: 165 mg/dL (ref 61–437)
IgG (Immunoglobin G), Serum: 1075 mg/dL (ref 603–1613)
IgM (Immunoglobulin M), Srm: 28 mg/dL (ref 20–172)
Total Protein ELP: 7 g/dL (ref 6.0–8.5)

## 2020-05-09 ENCOUNTER — Telehealth: Payer: Self-pay | Admitting: *Deleted

## 2020-05-09 NOTE — Telephone Encounter (Signed)
Patient called reporting that he is having itching and thinks it may be coming from the medicine he is on. He states that he does lotion himself and uses Aveeno when bathing. He states the itching starts later in day and during the night. He denies any jaundice. He is asking if he could get prescription for itching.

## 2020-05-10 ENCOUNTER — Telehealth: Payer: Self-pay | Admitting: Oncology

## 2020-05-10 NOTE — Telephone Encounter (Signed)
Please advise 

## 2020-05-10 NOTE — Telephone Encounter (Signed)
Please call patient to get his next scheduled appts here rescheduled.

## 2020-05-10 NOTE — Telephone Encounter (Signed)
Patient phoned saying that he is going to see Dr. Maylene Roes at [facility] on 06-07-20 and is wanting to know if he is still supposed to see Dr. Tasia Catchings. IF he is still supposed to see Dr. Tasia Catchings, he will not be able to do this on 05-25-20 as already scheduled. Message sent to MD team to follow up with patient on this at [phone].

## 2020-05-10 NOTE — Telephone Encounter (Signed)
Done.. Per pts request to R/S his 05/25/20 Lab/MD/ Xgeva Inj. appt to 05/30/20.

## 2020-05-11 NOTE — Telephone Encounter (Signed)
Patient infromed that he can use Benadryl. He thanked me for letting him know

## 2020-05-11 NOTE — Telephone Encounter (Signed)
Dr. Tasia Catchings advises him to take oral Benadryl for the itchiness.

## 2020-05-20 ENCOUNTER — Other Ambulatory Visit: Payer: Self-pay | Admitting: Family Medicine

## 2020-05-25 ENCOUNTER — Other Ambulatory Visit: Payer: BC Managed Care – PPO

## 2020-05-25 ENCOUNTER — Ambulatory Visit: Payer: BC Managed Care – PPO | Admitting: Oncology

## 2020-05-25 ENCOUNTER — Ambulatory Visit: Payer: BC Managed Care – PPO

## 2020-05-30 ENCOUNTER — Encounter: Payer: Self-pay | Admitting: Oncology

## 2020-05-30 ENCOUNTER — Inpatient Hospital Stay: Payer: BC Managed Care – PPO | Attending: Oncology

## 2020-05-30 ENCOUNTER — Inpatient Hospital Stay: Payer: BC Managed Care – PPO

## 2020-05-30 ENCOUNTER — Other Ambulatory Visit: Payer: Self-pay

## 2020-05-30 ENCOUNTER — Inpatient Hospital Stay (HOSPITAL_BASED_OUTPATIENT_CLINIC_OR_DEPARTMENT_OTHER): Payer: BC Managed Care – PPO | Admitting: Oncology

## 2020-05-30 VITALS — BP 143/85 | HR 63 | Temp 98.7°F | Resp 20 | Wt 206.3 lb

## 2020-05-30 DIAGNOSIS — E785 Hyperlipidemia, unspecified: Secondary | ICD-10-CM | POA: Insufficient documentation

## 2020-05-30 DIAGNOSIS — Z8249 Family history of ischemic heart disease and other diseases of the circulatory system: Secondary | ICD-10-CM | POA: Diagnosis not present

## 2020-05-30 DIAGNOSIS — Z5111 Encounter for antineoplastic chemotherapy: Secondary | ICD-10-CM | POA: Diagnosis not present

## 2020-05-30 DIAGNOSIS — Z800 Family history of malignant neoplasm of digestive organs: Secondary | ICD-10-CM | POA: Diagnosis not present

## 2020-05-30 DIAGNOSIS — Z9481 Bone marrow transplant status: Secondary | ICD-10-CM | POA: Insufficient documentation

## 2020-05-30 DIAGNOSIS — I129 Hypertensive chronic kidney disease with stage 1 through stage 4 chronic kidney disease, or unspecified chronic kidney disease: Secondary | ICD-10-CM | POA: Insufficient documentation

## 2020-05-30 DIAGNOSIS — N1831 Chronic kidney disease, stage 3a: Secondary | ICD-10-CM | POA: Insufficient documentation

## 2020-05-30 DIAGNOSIS — C9000 Multiple myeloma not having achieved remission: Secondary | ICD-10-CM

## 2020-05-30 DIAGNOSIS — L299 Pruritus, unspecified: Secondary | ICD-10-CM | POA: Insufficient documentation

## 2020-05-30 DIAGNOSIS — Z79899 Other long term (current) drug therapy: Secondary | ICD-10-CM | POA: Diagnosis not present

## 2020-05-30 DIAGNOSIS — M8580 Other specified disorders of bone density and structure, unspecified site: Secondary | ICD-10-CM | POA: Diagnosis not present

## 2020-05-30 DIAGNOSIS — Z833 Family history of diabetes mellitus: Secondary | ICD-10-CM | POA: Insufficient documentation

## 2020-05-30 DIAGNOSIS — N179 Acute kidney failure, unspecified: Secondary | ICD-10-CM

## 2020-05-30 DIAGNOSIS — D696 Thrombocytopenia, unspecified: Secondary | ICD-10-CM

## 2020-05-30 DIAGNOSIS — Z818 Family history of other mental and behavioral disorders: Secondary | ICD-10-CM | POA: Insufficient documentation

## 2020-05-30 DIAGNOSIS — Z7289 Other problems related to lifestyle: Secondary | ICD-10-CM | POA: Insufficient documentation

## 2020-05-30 DIAGNOSIS — Z881 Allergy status to other antibiotic agents status: Secondary | ICD-10-CM | POA: Insufficient documentation

## 2020-05-30 LAB — COMPREHENSIVE METABOLIC PANEL
ALT: 24 U/L (ref 0–44)
AST: 23 U/L (ref 15–41)
Albumin: 4.4 g/dL (ref 3.5–5.0)
Alkaline Phosphatase: 66 U/L (ref 38–126)
Anion gap: 8 (ref 5–15)
BUN: 20 mg/dL (ref 8–23)
CO2: 26 mmol/L (ref 22–32)
Calcium: 9.2 mg/dL (ref 8.9–10.3)
Chloride: 104 mmol/L (ref 98–111)
Creatinine, Ser: 1.29 mg/dL — ABNORMAL HIGH (ref 0.61–1.24)
GFR calc Af Amer: >60 mL/min (ref >60)
GFR calc non Af Amer: 58 mL/min — ABNORMAL LOW (ref >60)
Glucose, Bld: 102 mg/dL — ABNORMAL HIGH (ref 70–99)
Potassium: 4.4 mmol/L (ref 3.5–5.1)
Sodium: 138 mmol/L (ref 135–145)
Total Bilirubin: 0.8 mg/dL (ref 0.3–1.2)
Total Protein: 7.4 g/dL (ref 6.5–8.1)

## 2020-05-30 LAB — CBC WITH DIFFERENTIAL/PLATELET
Abs Immature Granulocytes: 0.02 10*3/uL (ref 0.00–0.07)
Basophils Absolute: 0 10*3/uL (ref 0.0–0.1)
Basophils Relative: 0 %
Eosinophils Absolute: 0 10*3/uL (ref 0.0–0.5)
Eosinophils Relative: 1 %
HCT: 36.1 % — ABNORMAL LOW (ref 39.0–52.0)
Hemoglobin: 12.6 g/dL — ABNORMAL LOW (ref 13.0–17.0)
Immature Granulocytes: 0 %
Lymphocytes Relative: 32 %
Lymphs Abs: 1.6 10*3/uL (ref 0.7–4.0)
MCH: 34.1 pg — ABNORMAL HIGH (ref 26.0–34.0)
MCHC: 34.9 g/dL (ref 30.0–36.0)
MCV: 97.8 fL (ref 80.0–100.0)
Monocytes Absolute: 0.5 10*3/uL (ref 0.1–1.0)
Monocytes Relative: 11 %
Neutro Abs: 2.9 10*3/uL (ref 1.7–7.7)
Neutrophils Relative %: 56 %
Platelets: 71 10*3/uL — ABNORMAL LOW (ref 150–400)
RBC: 3.69 MIL/uL — ABNORMAL LOW (ref 4.22–5.81)
RDW: 13.1 % (ref 11.5–15.5)
WBC: 5.1 10*3/uL (ref 4.0–10.5)
nRBC: 0 % (ref 0.0–0.2)

## 2020-05-30 MED ORDER — DENOSUMAB 120 MG/1.7ML ~~LOC~~ SOLN
120.0000 mg | Freq: Once | SUBCUTANEOUS | Status: AC
  Administered 2020-05-30: 120 mg via SUBCUTANEOUS
  Filled 2020-05-30: qty 1.7

## 2020-05-30 NOTE — Progress Notes (Signed)
Hematology/Oncology  Follow up note [facility] [phone] [phone]) [phone]   Patient Care Team: Tonia Ghent, MD as PCP - General (Family Medicine) Earlie Server, MD as Consulting Physician (Oncology) Beather Arbour Lilyan Punt, MD as Referring Physician (Hematology and Oncology) Diannia Ruder, MD as Referring Physician (Hematology and Oncology)  REASON FOR VISIT Follow up for multiple myeloma.   HISTORY OF PRESENTING ILLNESS:  Luis Burke is a  64 y.o.  male with PMH listed below presents for follow up of multiple myeloma.  # 07/27/2018 multiple myeloma panel showed M protein of 0.1, IgG 662, IgA 49, IgM 7. Patient was called back to further labs done. 08/10/2018, free light chain ratio showed extremely high level of kappa free light chain 10,183, with a kappa lambda light chain ratio of 1414.31 LDH 164 Beta-2 microglobulin 5 Patient was called and discuss about results.  He was recommended to undergo bone marrow biopsy and PET scan. 08/19/2018 bone marrow biopsy showed hypercellular marrow 80%, involved by plasma cell neoplasm up to 95%.  Consistent with plasma cell myeloma.  FISH and cytogenetics are pending.  09/10/2018 skeletal survey showed questionable small lucent lesions within the midshaft of the humerus bilaterally.  Otherwise no suspicious focal lytic lesion or acute bone abnormality. 08/25/2018 PET scan showed no definite hypermetabolic bone disease but CT findings are highly suspicious for numerous small myelomatous lesions involving the spine, sternum and scattered ribs.  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 He received preparative regimen with melphalan 200 mg/m on 04/22/2019 followed by autologous stem cell infusion on 04/23/2019. Currently he is transfusion independent.  Transfusion criteria with as needed hemoglobin less than 7.5 for platelet less than 10,000.  Transplant course was complicated with  febrile neutropenia grade 3, treated with vancomycin and cephapirin until engraftment on 05/06/2019. Patient also had engraftment syndrome and had to be started on Solu-Medrol 25 mg twice daily on 05/05/2019.  Transition to Medrol Dosepak on 05/07/2019 to complete steroid taper as outpatient.  Patient is currently on acyclovir for 1 year after transplant, dose was reduced on 722 11/20/2018 due to renal function. Patient had a baseline CKD with creatinine running between 1.4-1.6.  He had acute on chronic kidney injury and creatinine went up to 2.2 on 722.  Fluid hydration was given and creatinine decreased to baseline 1.5-1.7. His Hickman catheter was removed.  Candida Cruris treated with topical clotrimazole 1% 3 times daily and to resolution..  #  seen by Dr. Tonia Brooms BMT team on 06/30/2019.revaccination at [place] begins at 12 months post transplant I discussed with [facility] hematology Dr.Choi and he recommends plans as listed below.  -patient to be started on Ixazomib maintenance: -Ixazomib 22m on D1/D8/D15 out of 28 day cycle.  -patient will start re-vaccination 12 months post transplant.  -Post transplant bone marrow biopsy if clinically indicated.  # seen by [facility] bone marrow transplant team in June 2021.  Patient has been started on immunization protocol.  He reports no new complaints.  INTERVAL HISTORY Luis COWGERis a 64y.o. 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 #Patient is currently on ixizomab 3 mg on day 1, day 8, day 15 out of 28 days. He started current cycle on 05/25/2020. No new complaints except that he has experienced itchiness on his feet and back every night before going to bed. No development of  No fever chills. Appetite is  good.  . .   Review of Systems  Constitutional: Negative for appetite change, chills, fatigue, fever and unexpected weight change.  HENT:   Negative for hearing loss and voice change.    Eyes: Negative for eye problems and icterus.  Respiratory: Negative for chest tightness, cough and shortness of breath.   Cardiovascular: Negative for chest pain and leg swelling.  Gastrointestinal: Negative for abdominal distention and abdominal pain.  Endocrine: Negative for hot flashes.  Genitourinary: Negative for difficulty urinating, dysuria and frequency.   Musculoskeletal: Negative for arthralgias.  Skin: Positive for itching. Negative for rash.  Neurological: Negative for light-headedness and numbness.  Hematological: Negative for adenopathy. Does not bruise/bleed easily.  Psychiatric/Behavioral: Negative for confusion.    MEDICAL HISTORY:  Past Medical History:  Diagnosis Date  . AKI (acute kidney injury) ([place]) 01/06/2019  . Anemia   . 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[place])    autologous stem cell bone marrow transplant on 04/23/2019.  Marland Kitchen Fatty liver    on u/s 07/2018  . Hyperlipidemia 03/2004  . Hypertension 1994  . Snores     SURGICAL HISTORY: Past Surgical History:  Procedure Laterality Date  . CATARACT EXTRACTION W/PHACO Right 03/04/2018   Procedure: CATARACT EXTRACTION PHACO AND INTRAOCULAR LENS PLACEMENT ([place])  RIGHT TORIC;  Surgeon: Leandrew Koyanagi, MD;  Location: [place];  Service: Ophthalmology;  Laterality: Right;  [place] no Toric Lens 1:45 5.3  . COLONOSCOPY WITH PROPOFOL N/A 07/20/2018   Procedure: COLONOSCOPY WITH PROPOFOL;  Surgeon: Jonathon Bellows, MD;  Location[facility] ENDOSCOPY;  Service: Gastroenterology;  Laterality: N/A;  . ESOPHAGOGASTRODUODENOSCOPY (EGD) WITH PROPOFOL N/A 07/20/2018   Procedure: ESOPHAGOGASTRODUODENOSCOPY (EGD) WITH PROPOFOL;  Surgeon: Jonathon Bellows, MD;  Location: [facility] ENDOSCOPY;  Service: Gastroenterology;  Laterality: N/A;  . EYE SURGERY Right   . HERNIA REPAIR    . L Lap herniorraphy  04/2000  . Left wrist ganglionectomy      SOCIAL HISTORY: Social History   Socioeconomic History  . Marital status: Single     Spouse name: Not on file  . Number of children: 1  . Years of education: Not on file  . Highest education level: Not on file  Occupational History  . Occupation: [employer]  Tobacco Use  . Smoking status: Never Smoker  . Smokeless tobacco: Never Used  . Tobacco comment: Occassionally  Vaping Use  . Vaping Use: Never used  Substance and Sexual Activity  . Alcohol use: Not Currently    Alcohol/week: 3.0 standard drinks    Types: 3 Cans of beer per week  . Drug use: No  . Sexual activity: Not on file  Other Topics Concern  . Not on file  Social History Narrative   Divorced 12/10/07, dating as of 12/09/17   His daughter died 4 days after giving birth to the patient's granddaughter   Has joint custody of his dead daughter's child   Works at [employer] is [employer]:   . Difficulty of Paying Living Expenses:   Food Insecurity:   . Worried About Charity fundraiser in the Last Year:   . Arboriculturist in the Last Year:   Transportation Needs:   . Film/video editor (Medical):   Marland Kitchen Lack of Transportation (Non-Medical):   Physical Activity:   . Days of Exercise per Week:   . Minutes of Exercise per Session:   Stress:   . Feeling of Stress :  Social Connections:   . Frequency of Communication with Friends and Family:   . Frequency of Social Gatherings with Friends and Family:   . Attends Religious Services:   . Active Member of Clubs or Organizations:   . Attends Archivist Meetings:   Marland Kitchen Marital Status:   Intimate Partner Violence:   . Fear of Current or Ex-Partner:   . Emotionally Abused:   Marland Kitchen Physically Abused:   . Sexually Abused:     FAMILY HISTORY: Family History  Problem Relation Age of Onset  . Hypertension Mother   . Diabetes Mother   . Heart disease Mother        CAD  . Dementia Mother   . Hypertension Father   . Heart disease Father        MI 02/03  . Colon cancer Father   .  Hypertension Sister   . Hypertension Sister   . Hypertension Sister   . Prostate cancer Neg Hx     ALLERGIES:  is allergic to bactrim [sulfamethoxazole-trimethoprim], clonidine derivatives, and tadalafil.  MEDICATIONS:  Current Outpatient Medications  Medication Sig Dispense Refill  . acetaminophen (TYLENOL) 325 MG tablet Take 650 mg by mouth every 6 (six) hours as needed.    Marland Kitchen acyclovir (ZOVIRAX) 400 MG tablet Take 1 tablet (400 mg total) by mouth 2 (two) times daily. 60 tablet 11  . albuterol (VENTOLIN HFA) 108 (90 Base) MCG/ACT inhaler Inhale 1-2 puffs into the lungs every 6 (six) hours as needed (for cough). 18 g 0  . amLODipine (NORVASC) 5 MG tablet TAKE 1-2 TABLETS (5-10 MG TOTAL) BY MOUTH DAILY. 180 tablet 3  . B Complex Vitamins (VITAMIN B COMPLEX PO) SMARTSIG:By Mouth    . clopidogrel (PLAVIX) 75 MG tablet Take by mouth.    . co-enzyme Q-10 50 MG capsule Take 50 mg by mouth daily.    . cyanocobalamin ([employer] VITAMIN B12) 1000 MCG tablet Take 1 tablet (1,000 mcg total) by mouth daily. 90 tablet 3  . cyclobenzaprine (FLEXERIL) 5 MG tablet Take 1 tablet (5 mg total) by mouth 3 (three) times daily as needed for muscle spasms. 30 tablet 0  . famotidine (PEPCID) 20 MG tablet Take 20 mg by mouth daily.     . hydrALAZINE (APRESOLINE) 10 MG tablet Take 1 tablet (10 mg total) by mouth 3 (three) times daily. Please make an appointment for physical exam prior to further refills. 270 tablet 0  . ixazomib citrate (NINLARO) 3 MG capsule Take 1 capsule (3 mg) by mouth weekly, 3 weeks on, 1 week off, repeat every 4 weeks. Take on an empty stomach 1hr before or 2hr after meals. 3 capsule 3  . lisinopril (ZESTRIL) 20 MG tablet Take 1 tablet (20 mg total) by mouth daily. 90 tablet 3  . LORazepam (ATIVAN) 1 MG tablet TAKE 1 TABLET (1 MG TOTAL) BY MOUTH EVERY 6 (SIX) HOURS AS NEEDED FOR ANXIETY (FOR NAUSEA)    . magnesium chloride (SLOW-MAG) 64 MG TBEC SR tablet Take 1 tablet (64 mg total) by mouth daily.  60 tablet 0  . metoprolol succinate (TOPROL-XL) 50 MG 24 hr tablet Take 4 tablets (200 mg total) by mouth daily. Take with or immediately following a meal. 360 tablet 1  . ondansetron (ZOFRAN) 8 MG tablet Take 8 mg by mouth every 8 (eight) hours as needed. for nausea    . pravastatin (PRAVACHOL) 10 MG tablet Take 2 tablets (20 mg total) by mouth daily.    . prochlorperazine (  COMPAZINE) 10 MG tablet TAKE 1 TABLET (10 MG TOTAL) BY MOUTH EVERY 6 (SIX) HOURS AS NEEDED FOR NAUSEA    . zinc gluconate 50 MG tablet Take 50 mg by mouth daily.    Marland Kitchen azithromycin (ZITHROMAX Z-PAK) 250 MG tablet Take 2 pills by mouth today and then 1 pill daily for 4 days (Patient not taking: Reported on 04/27/2020) 6 tablet 0  . benzonatate (TESSALON) 200 MG capsule Take 1 capsule (200 mg total) by mouth 3 (three) times daily as needed for cough. (Patient not taking: Reported on 04/27/2020) 30 capsule 0   No current facility-administered medications for this visit.     PHYSICAL EXAMINATION: ECOG PERFORMANCE STATUS: 1 - Symptomatic but completely ambulatory Vitals:   05/30/20 1000  BP: (!) 143/85  Pulse: 63  Resp: 20  Temp: 98.7 F (37.1 C)  SpO2: 99%   Filed Weights   05/30/20 1000  Weight: 206 lb 4.8 oz (93.6 kg)    Physical Exam Constitutional:      General: He is not in acute distress. HENT:     Head: Normocephalic and atraumatic.  Eyes:     General: No scleral icterus. Cardiovascular:     Rate and Rhythm: Normal rate and regular rhythm.     Heart sounds: Normal heart sounds.  Pulmonary:     Effort: Pulmonary effort is normal. No respiratory distress.     Breath sounds: No wheezing.  Abdominal:     General: Bowel sounds are normal. There is no distension.     Palpations: Abdomen is soft.  Musculoskeletal:        General: No deformity. Normal range of motion.     Cervical back: Normal range of motion and neck supple.  Skin:    General: Skin is warm and dry.     Findings: No erythema or rash.   Neurological:     Mental Status: He is alert and oriented to person, place, and time. Mental status is at baseline.     Cranial Nerves: No cranial nerve deficit.     Coordination: Coordination normal.  Psychiatric:        Mood and Affect: Mood normal.      LABORATORY DATA:  I have reviewed the data as listed Lab Results  Component Value Date   WBC 5.1 05/30/2020   HGB 12.6 (L) 05/30/2020   HCT 36.1 (L) 05/30/2020   MCV 97.8 05/30/2020   PLT 71 (L) 05/30/2020   Recent Labs    03/01/20 0939 04/27/20 1016 05/30/20 0944  NA 141 138 138  K 4.4 4.0 4.4  CL 105 103 104  CO2 27 26 26   GLUCOSE 106* 103* 102*  BUN 18 23 20   CREATININE 1.35* 1.41* 1.29*  CALCIUM 9.4 9.6 9.2  GFRNONAA 55* 52* 58*  GFRAA >60 >60 >60  PROT 7.4 7.8 7.4  ALBUMIN 4.5 4.7 4.4  AST 25 24 23   ALT 30 24 24   ALKPHOS 71 62 66  BILITOT 0.7 0.9 0.8   Iron/TIBC/Ferritin/ %Sat    Component Value Date/Time   IRON 74 07/01/2018 0944   TIBC 250 07/01/2018 0944   FERRITIN 357 07/01/2018 0944   IRONPCTSAT 30 07/01/2018 0944    Lab Results  Component Value Date   TOTALPROTELP 7.0 04/27/2020   ALBUMINELP 3.7 03/10/2019   A1GS 0.3 03/10/2019   A2GS 0.8 03/10/2019   BETS 1.3 03/10/2019   GAMS 0.6 03/10/2019   MSPIKE Not Observed 03/10/2019   SPEI Comment 03/10/2019  Lab Results  Component Value Date   KPAFRELGTCHN 37.8 (H) 04/27/2020   LAMBDASER 13.7 04/27/2020   KAPLAMBRATIO 2.76 (H) 04/27/2020    RADIOGRAPHIC STUDIES: I have personally reviewed the radiological images as listed and agreed with the findings in the report. 10/23/2018 FINDINGS: The heart size and mediastinal contours are within normal limits. Both lungs are clear. The visualized skeletal structures are unremarkable. IMPRESSION: No active cardiopulmonary disease.   ASSESSMENT & PLAN:  1. Multiple myeloma, remission status unspecified ([place])   2. Thrombocytopenia ([place])   3. Encounter for antineoplastic chemotherapy   4.  Stage 3a chronic kidney disease   5. Itchy skin    #Light chain multiple myeloma beta 2 microglobulin 5 and normal albumin.  Stage II. cytogenetics showed normal male chromosome, MDS FISH panel showed trisomy 34- Standard Risk. S/p RVD x 9 and  Autologous bone marrow stem cell transplant at [facility] on 04/23/2019. He has not had post bone marrow transplant bone marrow biopsy yet.  Per [facility] transplant team, bone marrow biopsy will be considered if clinically indicated. Labs are reviewed and discussed with patient.   Labs are reviewed and discussed with the patient. His counts are stable.  Proceed with next cycle of ixizomab 3 mg on day 1, day 8, day 15 out of 28 days.  #Thrombocytopenia, platelet count 71,000,stable.  Chronic kidney disease, creatinine is stable. Chronic anemia, hemoglobin stable at 12.9. ichy without skin rash. Monitor symptoms. Benadryl 64m PO PRN. If no improvement, may consider Atarax.   #Post bone marrow transplant care, Continue acyclovir 400 mg twice daily  #Bone health/osteopenia/multiple myeloma Continue Xgeva monthly for 2 years. Continue vitamin D and calcium supplementation.   Follow up 4 weeks for evaluation prior to starting next cycle of chemotherapy for maintenance.  ZEarlie Server MD, PhD Hematology Oncology [facility]8/17/2021

## 2020-05-31 LAB — MULTIPLE MYELOMA PANEL, SERUM
Albumin SerPl Elph-Mcnc: 3.8 g/dL (ref 2.9–4.4)
Albumin/Glob SerPl: 1.5 (ref 0.7–1.7)
Alpha 1: 0.2 g/dL (ref 0.0–0.4)
Alpha2 Glob SerPl Elph-Mcnc: 0.6 g/dL (ref 0.4–1.0)
B-Globulin SerPl Elph-Mcnc: 1 g/dL (ref 0.7–1.3)
Gamma Glob SerPl Elph-Mcnc: 0.9 g/dL (ref 0.4–1.8)
Globulin, Total: 2.7 g/dL (ref 2.2–3.9)
IgA: 162 mg/dL (ref 61–437)
IgG (Immunoglobin G), Serum: 994 mg/dL (ref 603–1613)
IgM (Immunoglobulin M), Srm: 21 mg/dL (ref 20–172)
Total Protein ELP: 6.5 g/dL (ref 6.0–8.5)

## 2020-05-31 LAB — KAPPA/LAMBDA LIGHT CHAINS
Kappa free light chain: 38.9 mg/L — ABNORMAL HIGH (ref 3.3–19.4)
Kappa, lambda light chain ratio: 3.41 — ABNORMAL HIGH (ref 0.26–1.65)
Lambda free light chains: 11.4 mg/L (ref 5.7–26.3)

## 2020-06-07 DIAGNOSIS — Z9484 Stem cells transplant status: Secondary | ICD-10-CM | POA: Diagnosis not present

## 2020-06-07 DIAGNOSIS — Z9481 Bone marrow transplant status: Secondary | ICD-10-CM | POA: Diagnosis not present

## 2020-06-07 DIAGNOSIS — H269 Unspecified cataract: Secondary | ICD-10-CM | POA: Diagnosis not present

## 2020-06-07 DIAGNOSIS — E785 Hyperlipidemia, unspecified: Secondary | ICD-10-CM | POA: Diagnosis not present

## 2020-06-07 DIAGNOSIS — C9000 Multiple myeloma not having achieved remission: Secondary | ICD-10-CM | POA: Diagnosis not present

## 2020-06-07 DIAGNOSIS — Z23 Encounter for immunization: Secondary | ICD-10-CM | POA: Diagnosis not present

## 2020-06-07 DIAGNOSIS — N189 Chronic kidney disease, unspecified: Secondary | ICD-10-CM | POA: Diagnosis not present

## 2020-06-07 DIAGNOSIS — G4733 Obstructive sleep apnea (adult) (pediatric): Secondary | ICD-10-CM | POA: Diagnosis not present

## 2020-06-07 DIAGNOSIS — K760 Fatty (change of) liver, not elsewhere classified: Secondary | ICD-10-CM | POA: Diagnosis not present

## 2020-06-07 DIAGNOSIS — D649 Anemia, unspecified: Secondary | ICD-10-CM | POA: Diagnosis not present

## 2020-06-07 DIAGNOSIS — I129 Hypertensive chronic kidney disease with stage 1 through stage 4 chronic kidney disease, or unspecified chronic kidney disease: Secondary | ICD-10-CM | POA: Diagnosis not present

## 2020-06-22 ENCOUNTER — Encounter: Payer: Self-pay | Admitting: Oncology

## 2020-06-22 ENCOUNTER — Inpatient Hospital Stay (HOSPITAL_BASED_OUTPATIENT_CLINIC_OR_DEPARTMENT_OTHER): Payer: BC Managed Care – PPO | Admitting: Oncology

## 2020-06-22 ENCOUNTER — Other Ambulatory Visit: Payer: Self-pay

## 2020-06-22 ENCOUNTER — Inpatient Hospital Stay: Payer: BC Managed Care – PPO | Attending: Oncology

## 2020-06-22 VITALS — BP 145/91 | HR 62 | Temp 96.2°F | Resp 18 | Wt 203.6 lb

## 2020-06-22 DIAGNOSIS — I10 Essential (primary) hypertension: Secondary | ICD-10-CM | POA: Insufficient documentation

## 2020-06-22 DIAGNOSIS — Z800 Family history of malignant neoplasm of digestive organs: Secondary | ICD-10-CM | POA: Diagnosis not present

## 2020-06-22 DIAGNOSIS — D649 Anemia, unspecified: Secondary | ICD-10-CM | POA: Insufficient documentation

## 2020-06-22 DIAGNOSIS — N1831 Chronic kidney disease, stage 3a: Secondary | ICD-10-CM | POA: Diagnosis not present

## 2020-06-22 DIAGNOSIS — Z833 Family history of diabetes mellitus: Secondary | ICD-10-CM | POA: Diagnosis not present

## 2020-06-22 DIAGNOSIS — D696 Thrombocytopenia, unspecified: Secondary | ICD-10-CM | POA: Diagnosis not present

## 2020-06-22 DIAGNOSIS — Z9481 Bone marrow transplant status: Secondary | ICD-10-CM | POA: Diagnosis not present

## 2020-06-22 DIAGNOSIS — C9000 Multiple myeloma not having achieved remission: Secondary | ICD-10-CM | POA: Diagnosis not present

## 2020-06-22 DIAGNOSIS — Z79899 Other long term (current) drug therapy: Secondary | ICD-10-CM | POA: Diagnosis not present

## 2020-06-22 DIAGNOSIS — Z5111 Encounter for antineoplastic chemotherapy: Secondary | ICD-10-CM

## 2020-06-22 DIAGNOSIS — Z8249 Family history of ischemic heart disease and other diseases of the circulatory system: Secondary | ICD-10-CM | POA: Insufficient documentation

## 2020-06-22 DIAGNOSIS — E785 Hyperlipidemia, unspecified: Secondary | ICD-10-CM | POA: Diagnosis not present

## 2020-06-22 LAB — COMPREHENSIVE METABOLIC PANEL
ALT: 24 U/L (ref 0–44)
AST: 24 U/L (ref 15–41)
Albumin: 4.3 g/dL (ref 3.5–5.0)
Alkaline Phosphatase: 59 U/L (ref 38–126)
Anion gap: 10 (ref 5–15)
BUN: 24 mg/dL — ABNORMAL HIGH (ref 8–23)
CO2: 26 mmol/L (ref 22–32)
Calcium: 9.8 mg/dL (ref 8.9–10.3)
Chloride: 103 mmol/L (ref 98–111)
Creatinine, Ser: 1.55 mg/dL — ABNORMAL HIGH (ref 0.61–1.24)
GFR calc Af Amer: 54 mL/min — ABNORMAL LOW (ref >60)
GFR calc non Af Amer: 47 mL/min — ABNORMAL LOW (ref >60)
Glucose, Bld: 104 mg/dL — ABNORMAL HIGH (ref 70–99)
Potassium: 4.5 mmol/L (ref 3.5–5.1)
Sodium: 139 mmol/L (ref 135–145)
Total Bilirubin: 0.8 mg/dL (ref 0.3–1.2)
Total Protein: 7.6 g/dL (ref 6.5–8.1)

## 2020-06-22 LAB — CBC WITH DIFFERENTIAL/PLATELET
Abs Immature Granulocytes: 0.03 10*3/uL (ref 0.00–0.07)
Basophils Absolute: 0 10*3/uL (ref 0.0–0.1)
Basophils Relative: 0 %
Eosinophils Absolute: 0.1 10*3/uL (ref 0.0–0.5)
Eosinophils Relative: 2 %
HCT: 37 % — ABNORMAL LOW (ref 39.0–52.0)
Hemoglobin: 12.6 g/dL — ABNORMAL LOW (ref 13.0–17.0)
Immature Granulocytes: 1 %
Lymphocytes Relative: 29 %
Lymphs Abs: 1.6 10*3/uL (ref 0.7–4.0)
MCH: 34.1 pg — ABNORMAL HIGH (ref 26.0–34.0)
MCHC: 34.1 g/dL (ref 30.0–36.0)
MCV: 100 fL (ref 80.0–100.0)
Monocytes Absolute: 0.5 10*3/uL (ref 0.1–1.0)
Monocytes Relative: 10 %
Neutro Abs: 3.3 10*3/uL (ref 1.7–7.7)
Neutrophils Relative %: 58 %
Platelets: 81 10*3/uL — ABNORMAL LOW (ref 150–400)
RBC: 3.7 MIL/uL — ABNORMAL LOW (ref 4.22–5.81)
RDW: 13.1 % (ref 11.5–15.5)
WBC: 5.6 10*3/uL (ref 4.0–10.5)
nRBC: 0 % (ref 0.0–0.2)

## 2020-06-22 NOTE — Progress Notes (Signed)
Hematology/Oncology  Follow up note [facility] [phone] [phone]   Patient Care Team: Tonia Ghent, MD as PCP - General (Family Medicine) Earlie Server, MD as Consulting Physician (Oncology) Beather Arbour Lilyan Punt, MD as Referring Physician (Hematology and Oncology) Diannia Ruder, MD as Referring Physician (Hematology and Oncology)  REASON FOR VISIT Follow up for multiple myeloma.   HISTORY OF PRESENTING ILLNESS:  Luis Burke is a  64 y.o.  male with PMH listed below presents for follow up of multiple myeloma.  # 07/27/2018 multiple myeloma panel showed M protein of 0.1, IgG 662, IgA 49, IgM 7. Patient was called back to further labs done. 08/10/2018, free light chain ratio showed extremely high level of kappa free light chain 10,183, with a kappa lambda light chain ratio of 1414.31 LDH 164 Beta-2 microglobulin 5 Patient was called and discuss about results.  He was recommended to undergo bone marrow biopsy and PET scan. 08/19/2018 bone marrow biopsy showed hypercellular marrow 80%, involved by plasma cell neoplasm up to 95%.  Consistent with plasma cell myeloma.  FISH and cytogenetics are pending.  09/10/2018 skeletal survey showed questionable small lucent lesions within the midshaft of the humerus bilaterally.  Otherwise no suspicious focal lytic lesion or acute bone abnormality. 08/25/2018 PET scan showed no definite hypermetabolic bone disease but CT findings are highly suspicious for numerous small myelomatous lesions involving the spine, sternum and scattered ribs.  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 He received preparative regimen with melphalan 200 mg/m on 04/22/2019 followed by autologous stem cell infusion on 04/23/2019. Currently he is transfusion independent.  Transfusion criteria with as needed hemoglobin less than 7.5 for platelet less than 10,000.  Transplant course was complicated with  febrile neutropenia grade 3, treated with vancomycin and cephapirin until engraftment on 05/06/2019. Patient also had engraftment syndrome and had to be started on Solu-Medrol 25 mg twice daily on 05/05/2019.  Transition to Medrol Dosepak on 05/07/2019 to complete steroid taper as outpatient.  Patient is currently on acyclovir for 1 year after transplant, dose was reduced on 722 11/20/2018 due to renal function. Patient had a baseline CKD with creatinine running between 1.4-1.6.  He had acute on chronic kidney injury and creatinine went up to 2.2 on 722.  Fluid hydration was given and creatinine decreased to baseline 1.5-1.7. His Hickman catheter was removed.  Candida Cruris treated with topical clotrimazole 1% 3 times daily and to resolution..  #  seen by Dr. Tonia Brooms BMT team on 06/30/2019.revaccination at [place] begins at 12 months post transplant I discussed with [facility] hematology Dr.Choi and he recommends plans as listed below.  -patient to be started on Ixazomib maintenance: -Ixazomib 22m on D1/D8/D15 out of 28 day cycle.  -patient will start re-vaccination 12 months post transplant.  -Post transplant bone marrow biopsy if clinically indicated.  # seen by [facility] bone marrow transplant team in June 2021.  Patient has been started on immunization protocol.  He reports no new complaints.  INTERVAL HISTORY Luis GOULETTEis a 64y.o. 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 #Patient is currently on ixizomab 3 mg on day 1, day 8, day 15 out of 28 days. He plan to start next cycle today.  No new complaints.  [facility] 06/07/2020 and has started vaccination.    Review of Systems  Constitutional: Negative  for appetite change, chills, fatigue, fever and unexpected weight change.  HENT:   Negative for hearing loss and voice change.   Eyes: Negative for eye problems and icterus.   Respiratory: Negative for chest tightness, cough and shortness of breath.   Cardiovascular: Negative for chest pain and leg swelling.  Gastrointestinal: Negative for abdominal distention and abdominal pain.  Endocrine: Negative for hot flashes.  Genitourinary: Negative for difficulty urinating, dysuria and frequency.   Musculoskeletal: Negative for arthralgias.  Skin: Negative for itching and rash.  Neurological: Negative for light-headedness and numbness.  Hematological: Negative for adenopathy. Does not bruise/bleed easily.  Psychiatric/Behavioral: Negative for confusion.    MEDICAL HISTORY:  Past Medical History:  Diagnosis Date   AKI (acute kidney injury) ([place]) 01/06/2019   Anemia    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[place])    autologous stem cell bone marrow transplant on 04/23/2019.   Fatty liver    on u/s 07/2018   Hyperlipidemia 03/2004   Hypertension 1994   Snores     SURGICAL HISTORY: Past Surgical History:  Procedure Laterality Date   CATARACT EXTRACTION W/PHACO Right 03/04/2018   Procedure: CATARACT EXTRACTION PHACO AND INTRAOCULAR LENS PLACEMENT ([place])  RIGHT TORIC;  Surgeon: Leandrew Koyanagi, MD;  Location: [place];  Service: Ophthalmology;  Laterality: Right;  Per [place] no Toric Lens 1:45 5.3   COLONOSCOPY WITH PROPOFOL N/A 07/20/2018   Procedure: COLONOSCOPY WITH PROPOFOL;  Surgeon: Jonathon Bellows, MD;  Location: [facility] ENDOSCOPY;  Service: Gastroenterology;  Laterality: N/A;   ESOPHAGOGASTRODUODENOSCOPY (EGD) WITH PROPOFOL N/A 07/20/2018   Procedure: ESOPHAGOGASTRODUODENOSCOPY (EGD) WITH PROPOFOL;  Surgeon: Jonathon Bellows, MD;  Location: [facility] ENDOSCOPY;  Service: Gastroenterology;  Laterality: N/A;   EYE SURGERY Right    HERNIA REPAIR     L Lap herniorraphy  04/2000   Left wrist ganglionectomy      SOCIAL HISTORY: Social History   Socioeconomic History   Marital status: Single    Spouse name: Not on file   Number of children: 1   Years  of education: Not on file   Highest education level: Not on file  Occupational History   Occupation: [employer]  Tobacco Use   Smoking status: Never Smoker   Smokeless tobacco: Never Used   Tobacco comment: Occassionally  Vaping Use   Vaping Use: Never used  Substance and Sexual Activity   Alcohol use: Not Currently    Alcohol/week: 3.0 standard drinks    Types: 3 Cans of beer per week   Drug use: No   Sexual activity: Not on file  Other Topics Concern   Not on file  Social History Narrative   Divorced 12/27/07, dating as of 12/26/17   His daughter died 4 days after giving birth to the patient's granddaughter   Has joint custody of his dead daughter's child   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 Strain:    Difficulty of Paying Living Expenses: Not on [employer] Insecurity:    Worried About Charity fundraiser in the Last Year: Not on file   [employer] of Food in the Last Year: Not on file  Transportation Needs:    Film/video editor (Medical): Not on file   Lack of Transportation (Non-Medical): Not on file  Physical Activity:    Days of Exercise per Week: Not on file   Minutes of Exercise per Session: Not on file  Stress:    Feeling of Stress :  Not on file  Social Connections:    Frequency of Communication with Friends and Family: Not on file   Frequency of Social Gatherings with Friends and Family: Not on file   Attends Religious Services: Not on file   Active Member of Clubs or Organizations: Not on file   Attends Archivist Meetings: Not on file   Marital Status: Not on file  Intimate Partner Violence:    Fear of Current or Ex-Partner: Not on file   Emotionally Abused: Not on file   Physically Abused: Not on file   Sexually Abused: Not on file    FAMILY HISTORY: Family History  Problem Relation Age of Onset   Hypertension Mother    Diabetes Mother    Heart disease  Mother        CAD   Dementia Mother    Hypertension Father    Heart disease Father        MI 02/03   Colon cancer Father    Hypertension Sister    Hypertension Sister    Hypertension Sister    Prostate cancer Neg Hx     ALLERGIES:  is allergic to bactrim [sulfamethoxazole-trimethoprim], clonidine derivatives, and tadalafil.  MEDICATIONS:  Current Outpatient Medications  Medication Sig Dispense Refill   acetaminophen (TYLENOL) 325 MG tablet Take 650 mg by mouth every 6 (six) hours as needed.     acyclovir (ZOVIRAX) 400 MG tablet Take 1 tablet (400 mg total) by mouth 2 (two) times daily. 60 tablet 11   amLODipine (NORVASC) 5 MG tablet TAKE 1-2 TABLETS (5-10 MG TOTAL) BY MOUTH DAILY. 180 tablet 3   B Complex Vitamins (VITAMIN B COMPLEX PO) SMARTSIG:By Mouth     clopidogrel (PLAVIX) 75 MG tablet Take by mouth.     co-enzyme Q-10 50 MG capsule Take 50 mg by mouth daily.     cyanocobalamin ([employer] VITAMIN B12) 1000 MCG tablet Take 1 tablet (1,000 mcg total) by mouth daily. 90 tablet 3   cyclobenzaprine (FLEXERIL) 5 MG tablet Take 1 tablet (5 mg total) by mouth 3 (three) times daily as needed for muscle spasms. 30 tablet 0   famotidine (PEPCID) 20 MG tablet Take 20 mg by mouth daily.      hydrALAZINE (APRESOLINE) 10 MG tablet Take 1 tablet (10 mg total) by mouth 3 (three) times daily. Please make an appointment for physical exam prior to further refills. 270 tablet 0   ixazomib citrate (NINLARO) 3 MG capsule Take 1 capsule (3 mg) by mouth weekly, 3 weeks on, 1 week off, repeat every 4 weeks. Take on an empty stomach 1hr before or 2hr after meals. 3 capsule 3   lisinopril (ZESTRIL) 20 MG tablet Take 1 tablet (20 mg total) by mouth daily. 90 tablet 3   LORazepam (ATIVAN) 1 MG tablet TAKE 1 TABLET (1 MG TOTAL) BY MOUTH EVERY 6 (SIX) HOURS AS NEEDED FOR ANXIETY (FOR NAUSEA)     magnesium chloride (SLOW-MAG) 64 MG TBEC SR tablet Take 1 tablet (64 mg total) by mouth daily. 60  tablet 0   metoprolol succinate (TOPROL-XL) 50 MG 24 hr tablet Take 4 tablets (200 mg total) by mouth daily. Take with or immediately following a meal. 360 tablet 1   ondansetron (ZOFRAN) 8 MG tablet Take 8 mg by mouth every 8 (eight) hours as needed. for nausea     pravastatin (PRAVACHOL) 10 MG tablet Take 2 tablets (20 mg total) by mouth daily.     prochlorperazine (COMPAZINE) 10 MG  tablet TAKE 1 TABLET (10 MG TOTAL) BY MOUTH EVERY 6 (SIX) HOURS AS NEEDED FOR NAUSEA     zinc gluconate 50 MG tablet Take 50 mg by mouth daily.     No current facility-administered medications for this visit.     PHYSICAL EXAMINATION: ECOG PERFORMANCE STATUS: 1 - Symptomatic but completely ambulatory Vitals:   06/22/20 0907  BP: (!) 145/91  Pulse: 62  Resp: 18  Temp: (!) 96.2 F (35.7 C)   Filed Weights   06/22/20 0907  Weight: 203 lb 9.6 oz (92.4 kg)    Physical Exam Constitutional:      General: He is not in acute distress. HENT:     Head: Normocephalic and atraumatic.  Eyes:     General: No scleral icterus. Cardiovascular:     Rate and Rhythm: Normal rate and regular rhythm.     Heart sounds: Normal heart sounds.  Pulmonary:     Effort: Pulmonary effort is normal. No respiratory distress.     Breath sounds: No wheezing.  Abdominal:     General: Bowel sounds are normal. There is no distension.     Palpations: Abdomen is soft.  Musculoskeletal:        General: No deformity. Normal range of motion.     Cervical back: Normal range of motion and neck supple.  Skin:    General: Skin is warm and dry.     Findings: No erythema or rash.  Neurological:     Mental Status: He is alert and oriented to person, place, and time. Mental status is at baseline.     Cranial Nerves: No cranial nerve deficit.     Coordination: Coordination normal.  Psychiatric:        Mood and Affect: Mood normal.      LABORATORY DATA:  I have reviewed the data as listed Lab Results  Component Value Date    WBC 5.6 06/22/2020   HGB 12.6 (L) 06/22/2020   HCT 37.0 (L) 06/22/2020   MCV 100.0 06/22/2020   PLT 81 (L) 06/22/2020   Recent Labs    04/27/20 1016 05/30/20 0944 06/22/20 0847  NA 138 138 139  K 4.0 4.4 4.5  CL 103 104 103  CO2 26 26 26   GLUCOSE 103* 102* 104*  BUN 23 20 24*  CREATININE 1.41* 1.29* 1.55*  CALCIUM 9.6 9.2 9.8  GFRNONAA 52* 58* 47*  GFRAA >60 >60 54*  PROT 7.8 7.4 7.6  ALBUMIN 4.7 4.4 4.3  AST 24 23 24   ALT 24 24 24   ALKPHOS 62 66 59  BILITOT 0.9 0.8 0.8   Iron/TIBC/Ferritin/ %Sat    Component Value Date/Time   IRON 74 07/01/2018 0944   TIBC 250 07/01/2018 0944   FERRITIN 357 07/01/2018 0944   IRONPCTSAT 30 07/01/2018 0944    Lab Results  Component Value Date   TOTALPROTELP 6.5 05/30/2020   ALBUMINELP 3.7 03/10/2019   A1GS 0.3 03/10/2019   A2GS 0.8 03/10/2019   BETS 1.3 03/10/2019   GAMS 0.6 03/10/2019   MSPIKE Not Observed 03/10/2019   SPEI Comment 03/10/2019   Lab Results  Component Value Date   KPAFRELGTCHN 38.9 (H) 05/30/2020   LAMBDASER 11.4 05/30/2020   KAPLAMBRATIO 3.41 (H) 05/30/2020    RADIOGRAPHIC STUDIES: I have personally reviewed the radiological images as listed and agreed with the findings in the report. 10/23/2018 FINDINGS: The heart size and mediastinal contours are within normal limits. Both lungs are clear. The visualized skeletal structures are unremarkable. IMPRESSION:  No active cardiopulmonary disease.   ASSESSMENT & PLAN:  1. Multiple myeloma, remission status unspecified ([place])   2. Thrombocytopenia ([place])   3. Encounter for antineoplastic chemotherapy   4. Stage 3a chronic kidney disease    #Light chain multiple myeloma beta 2 microglobulin 5 and normal albumin.  Stage II. cytogenetics showed normal male chromosome, MDS FISH panel showed trisomy 23- Standard Risk. S/p RVD x 9 and  Autologous bone marrow stem cell transplant at [facility] on 04/23/2019. He has not had post bone marrow transplant bone marrow  biopsy yet.  Per [facility] transplant team, bone marrow biopsy will be considered if clinically indicated. Labs are reviewed and discussed with patient. Stable counts. Continue this cycle of ixizomab.   Thrombocytopenia, stable.  Chronic kidney disease, creatinine is stable. Encourage oral hydration. Chronic anemia, hemoglobin stable at 12.6, stable.   Post bone marrow transplant care, Continue acyclovir 400 mg twice daily, he is now 14 months post transplant. Ask patient to check with Dr.Choi to see if he still needs to conitnue prophylaxis.  Post transplant vaccination at [facility].  Recommend Covid 19 vaccination booster. Recommend fall 2021 flu vaccination.   #Bone health/osteopenia/multiple myeloma- Dr.Choi plan to arrange CT skeletal survey.  Continue Xgeva monthly for 2 years, Delton See next week.  Continue vitamin D and calcium supplementation.   Follow up 4 weeks for evaluation prior to starting next cycle of chemotherapy for maintenance.  Earlie Server, MD, PhD Hematology Oncology [place] at [facility] 06/22/2020

## 2020-06-23 LAB — KAPPA/LAMBDA LIGHT CHAINS
Kappa free light chain: 46.5 mg/L — ABNORMAL HIGH (ref 3.3–19.4)
Kappa, lambda light chain ratio: 3.27 — ABNORMAL HIGH (ref 0.26–1.65)
Lambda free light chains: 14.2 mg/L (ref 5.7–26.3)

## 2020-06-26 LAB — MULTIPLE MYELOMA PANEL, SERUM
Albumin SerPl Elph-Mcnc: 4.1 g/dL (ref 2.9–4.4)
Albumin/Glob SerPl: 1.3 (ref 0.7–1.7)
Alpha 1: 0.2 g/dL (ref 0.0–0.4)
Alpha2 Glob SerPl Elph-Mcnc: 0.8 g/dL (ref 0.4–1.0)
B-Globulin SerPl Elph-Mcnc: 1.1 g/dL (ref 0.7–1.3)
Gamma Glob SerPl Elph-Mcnc: 1.1 g/dL (ref 0.4–1.8)
Globulin, Total: 3.2 g/dL (ref 2.2–3.9)
IgA: 168 mg/dL (ref 61–437)
IgG (Immunoglobin G), Serum: 1091 mg/dL (ref 603–1613)
IgM (Immunoglobulin M), Srm: 38 mg/dL (ref 20–172)
Total Protein ELP: 7.3 g/dL (ref 6.0–8.5)

## 2020-06-27 ENCOUNTER — Other Ambulatory Visit: Payer: Self-pay

## 2020-06-27 ENCOUNTER — Inpatient Hospital Stay: Payer: BC Managed Care – PPO

## 2020-06-27 DIAGNOSIS — Z800 Family history of malignant neoplasm of digestive organs: Secondary | ICD-10-CM | POA: Diagnosis not present

## 2020-06-27 DIAGNOSIS — C9000 Multiple myeloma not having achieved remission: Secondary | ICD-10-CM | POA: Diagnosis not present

## 2020-06-27 DIAGNOSIS — Z8249 Family history of ischemic heart disease and other diseases of the circulatory system: Secondary | ICD-10-CM | POA: Diagnosis not present

## 2020-06-27 DIAGNOSIS — D649 Anemia, unspecified: Secondary | ICD-10-CM | POA: Diagnosis not present

## 2020-06-27 DIAGNOSIS — Z9481 Bone marrow transplant status: Secondary | ICD-10-CM | POA: Diagnosis not present

## 2020-06-27 DIAGNOSIS — I10 Essential (primary) hypertension: Secondary | ICD-10-CM | POA: Diagnosis not present

## 2020-06-27 DIAGNOSIS — Z833 Family history of diabetes mellitus: Secondary | ICD-10-CM | POA: Diagnosis not present

## 2020-06-27 DIAGNOSIS — E785 Hyperlipidemia, unspecified: Secondary | ICD-10-CM | POA: Diagnosis not present

## 2020-06-27 DIAGNOSIS — N179 Acute kidney failure, unspecified: Secondary | ICD-10-CM

## 2020-06-27 DIAGNOSIS — Z79899 Other long term (current) drug therapy: Secondary | ICD-10-CM | POA: Diagnosis not present

## 2020-06-27 MED ORDER — DENOSUMAB 120 MG/1.7ML ~~LOC~~ SOLN
120.0000 mg | Freq: Once | SUBCUTANEOUS | Status: AC
  Administered 2020-06-27: 120 mg via SUBCUTANEOUS
  Filled 2020-06-27: qty 1.7

## 2020-07-02 ENCOUNTER — Other Ambulatory Visit: Payer: Self-pay | Admitting: Family Medicine

## 2020-07-12 DIAGNOSIS — N281 Cyst of kidney, acquired: Secondary | ICD-10-CM | POA: Diagnosis not present

## 2020-07-12 DIAGNOSIS — C9000 Multiple myeloma not having achieved remission: Secondary | ICD-10-CM | POA: Diagnosis not present

## 2020-07-20 ENCOUNTER — Encounter: Payer: Self-pay | Admitting: Oncology

## 2020-07-20 ENCOUNTER — Inpatient Hospital Stay: Payer: BC Managed Care – PPO | Attending: Oncology

## 2020-07-20 ENCOUNTER — Inpatient Hospital Stay (HOSPITAL_BASED_OUTPATIENT_CLINIC_OR_DEPARTMENT_OTHER): Payer: BC Managed Care – PPO | Admitting: Oncology

## 2020-07-20 ENCOUNTER — Other Ambulatory Visit: Payer: Self-pay

## 2020-07-20 VITALS — BP 130/84 | HR 61 | Temp 97.2°F | Resp 18 | Wt 205.4 lb

## 2020-07-20 DIAGNOSIS — I129 Hypertensive chronic kidney disease with stage 1 through stage 4 chronic kidney disease, or unspecified chronic kidney disease: Secondary | ICD-10-CM | POA: Diagnosis not present

## 2020-07-20 DIAGNOSIS — D696 Thrombocytopenia, unspecified: Secondary | ICD-10-CM | POA: Diagnosis not present

## 2020-07-20 DIAGNOSIS — N1831 Chronic kidney disease, stage 3a: Secondary | ICD-10-CM | POA: Diagnosis not present

## 2020-07-20 DIAGNOSIS — K760 Fatty (change of) liver, not elsewhere classified: Secondary | ICD-10-CM | POA: Diagnosis not present

## 2020-07-20 DIAGNOSIS — Z79899 Other long term (current) drug therapy: Secondary | ICD-10-CM | POA: Diagnosis not present

## 2020-07-20 DIAGNOSIS — C9000 Multiple myeloma not having achieved remission: Secondary | ICD-10-CM

## 2020-07-20 DIAGNOSIS — M899 Disorder of bone, unspecified: Secondary | ICD-10-CM

## 2020-07-20 DIAGNOSIS — N183 Chronic kidney disease, stage 3 unspecified: Secondary | ICD-10-CM | POA: Diagnosis not present

## 2020-07-20 DIAGNOSIS — Z9481 Bone marrow transplant status: Secondary | ICD-10-CM | POA: Insufficient documentation

## 2020-07-20 DIAGNOSIS — M8580 Other specified disorders of bone density and structure, unspecified site: Secondary | ICD-10-CM | POA: Insufficient documentation

## 2020-07-20 DIAGNOSIS — D631 Anemia in chronic kidney disease: Secondary | ICD-10-CM | POA: Diagnosis not present

## 2020-07-20 DIAGNOSIS — Z5111 Encounter for antineoplastic chemotherapy: Secondary | ICD-10-CM | POA: Diagnosis not present

## 2020-07-20 LAB — COMPREHENSIVE METABOLIC PANEL
ALT: 22 U/L (ref 0–44)
AST: 20 U/L (ref 15–41)
Albumin: 4 g/dL (ref 3.5–5.0)
Alkaline Phosphatase: 53 U/L (ref 38–126)
Anion gap: 9 (ref 5–15)
BUN: 24 mg/dL — ABNORMAL HIGH (ref 8–23)
CO2: 23 mmol/L (ref 22–32)
Calcium: 9 mg/dL (ref 8.9–10.3)
Chloride: 108 mmol/L (ref 98–111)
Creatinine, Ser: 1.35 mg/dL — ABNORMAL HIGH (ref 0.61–1.24)
GFR calc non Af Amer: 55 mL/min — ABNORMAL LOW (ref >60)
Glucose, Bld: 110 mg/dL — ABNORMAL HIGH (ref 70–99)
Potassium: 4.1 mmol/L (ref 3.5–5.1)
Sodium: 140 mmol/L (ref 135–145)
Total Bilirubin: 0.9 mg/dL (ref 0.3–1.2)
Total Protein: 6.9 g/dL (ref 6.5–8.1)

## 2020-07-20 LAB — CBC WITH DIFFERENTIAL/PLATELET
Abs Immature Granulocytes: 0.02 10*3/uL (ref 0.00–0.07)
Basophils Absolute: 0 10*3/uL (ref 0.0–0.1)
Basophils Relative: 0 %
Eosinophils Absolute: 0.1 10*3/uL (ref 0.0–0.5)
Eosinophils Relative: 1 %
HCT: 32.5 % — ABNORMAL LOW (ref 39.0–52.0)
Hemoglobin: 11.3 g/dL — ABNORMAL LOW (ref 13.0–17.0)
Immature Granulocytes: 0 %
Lymphocytes Relative: 24 %
Lymphs Abs: 1.4 10*3/uL (ref 0.7–4.0)
MCH: 34.7 pg — ABNORMAL HIGH (ref 26.0–34.0)
MCHC: 34.8 g/dL (ref 30.0–36.0)
MCV: 99.7 fL (ref 80.0–100.0)
Monocytes Absolute: 0.6 10*3/uL (ref 0.1–1.0)
Monocytes Relative: 10 %
Neutro Abs: 3.7 10*3/uL (ref 1.7–7.7)
Neutrophils Relative %: 65 %
Platelets: 69 10*3/uL — ABNORMAL LOW (ref 150–400)
RBC: 3.26 MIL/uL — ABNORMAL LOW (ref 4.22–5.81)
RDW: 13.2 % (ref 11.5–15.5)
WBC: 5.7 10*3/uL (ref 4.0–10.5)
nRBC: 0 % (ref 0.0–0.2)

## 2020-07-20 NOTE — Progress Notes (Signed)
Hematology/Oncology  Follow up note [facility] [phone] [phone]   Patient Care Team: Tonia Ghent, MD as PCP - General (Family Medicine) Earlie Server, MD as Consulting Physician (Oncology) Beather Arbour Lilyan Punt, MD as Referring Physician (Hematology and Oncology) Diannia Ruder, MD as Referring Physician (Hematology and Oncology)  REASON FOR VISIT Follow up for multiple myeloma.   HISTORY OF PRESENTING ILLNESS:  Luis Burke is a  64 y.o.  male with PMH listed below presents for follow up of multiple myeloma.  # 07/27/2018 multiple myeloma panel showed M protein of 0.1, IgG 662, IgA 49, IgM 7. Patient was called back to further labs done. 08/10/2018, free light chain ratio showed extremely high level of kappa free light chain 10,183, with a kappa lambda light chain ratio of 1414.31 LDH 164 Beta-2 microglobulin 5 Patient was called and discuss about results.  He was recommended to undergo bone marrow biopsy and PET scan. 08/19/2018 bone marrow biopsy showed hypercellular marrow 80%, involved by plasma cell neoplasm up to 95%.  Consistent with plasma cell myeloma.  FISH and cytogenetics are pending.  09/10/2018 skeletal survey showed questionable small lucent lesions within the midshaft of the humerus bilaterally.  Otherwise no suspicious focal lytic lesion or acute bone abnormality. 08/25/2018 PET scan showed no definite hypermetabolic bone disease but CT findings are highly suspicious for numerous small myelomatous lesions involving the spine, sternum and scattered ribs.  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 He received preparative regimen with melphalan 200 mg/m on 04/22/2019 followed by autologous stem cell infusion on 04/23/2019. Currently he is transfusion independent.  Transfusion criteria with as needed hemoglobin less than 7.5 for platelet less than 10,000.  Transplant course was complicated with  febrile neutropenia grade 3, treated with vancomycin and cephapirin until engraftment on 05/06/2019. Patient also had engraftment syndrome and had to be started on Solu-Medrol 25 mg twice daily on 05/05/2019.  Transition to Medrol Dosepak on 05/07/2019 to complete steroid taper as outpatient.  Patient is currently on acyclovir for 1 year after transplant, dose was reduced on 722 11/20/2018 due to renal function. Patient had a baseline CKD with creatinine running between 1.4-1.6.  He had acute on chronic kidney injury and creatinine went up to 2.2 on 722.  Fluid hydration was given and creatinine decreased to baseline 1.5-1.7. His Hickman catheter was removed.  Candida Cruris treated with topical clotrimazole 1% 3 times daily and to resolution..  #  seen by Dr. Tonia Brooms BMT team on 06/30/2019.revaccination at [place] begins at 12 months post transplant I discussed with [facility] hematology Dr.Choi and he recommends plans as listed below.  -patient to be started on Ixazomib maintenance: -Ixazomib 47m on D1/D8/D15 out of 28 day cycle.  -patient will start re-vaccination 12 months post transplant.  -Post transplant bone marrow biopsy if clinically indicated.  # seen by [facility] bone marrow transplant team in June 2021.  Patient has been started on immunization protocol.  He reports no new complaints.  INTERVAL HISTORY Luis KNUEPPELis a 64y.o. 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 #Patient is currently on ixizomab 3 mg on day 1, day 8, day 15 out of 28 days. He is going to start next cycle today No new complaints.  [facility]. Lucent lesion in the L4 spinous process measuring 4 mm which is  nonspecific. Consider confirmation of marrow replacing process with MRI.  2. Incompletely characterized renal cysts Chronic back pain.  Review of Systems  Constitutional: Negative for appetite change, chills,  fatigue, fever and unexpected weight change.  HENT:   Negative for hearing loss and voice change.   Eyes: Negative for eye problems and icterus.  Respiratory: Negative for chest tightness, cough and shortness of breath.   Cardiovascular: Negative for chest pain and leg swelling.  Gastrointestinal: Negative for abdominal distention and abdominal pain.  Endocrine: Negative for hot flashes.  Genitourinary: Negative for difficulty urinating, dysuria and frequency.   Musculoskeletal: Negative for arthralgias.  Skin: Negative for itching and rash.  Neurological: Negative for light-headedness and numbness.  Hematological: Negative for adenopathy. Does not bruise/bleed easily.  Psychiatric/Behavioral: Negative for confusion.    MEDICAL HISTORY:  Past Medical History:  Diagnosis Date  . AKI (acute kidney injury) ([place]) 01/06/2019  . Anemia   . 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[place])    autologous stem cell bone marrow transplant on 04/23/2019.  Marland Kitchen Fatty liver    on u/s 07/2018  . Hyperlipidemia 03/2004  . Hypertension 1994  . Snores     SURGICAL HISTORY: Past Surgical History:  Procedure Laterality Date  . CATARACT EXTRACTION W/PHACO Right 03/04/2018   Procedure: CATARACT EXTRACTION PHACO AND INTRAOCULAR LENS PLACEMENT ([place])  RIGHT TORIC;  Surgeon: Leandrew Koyanagi, MD;  Location: [place];  Service: Ophthalmology;  Laterality: Right;  [place] no Toric Lens 1:45 5.3  . COLONOSCOPY WITH PROPOFOL N/A 07/20/2018   Procedure: COLONOSCOPY WITH PROPOFOL;  Surgeon: Jonathon Bellows, MD;  Location: [facility] ENDOSCOPY;  Service: Gastroenterology;  Laterality: N/A;  . ESOPHAGOGASTRODUODENOSCOPY (EGD) WITH PROPOFOL N/A 07/20/2018   Procedure: ESOPHAGOGASTRODUODENOSCOPY (EGD) WITH PROPOFOL;  Surgeon: Jonathon Bellows, MD;  Location: [facility] ENDOSCOPY;  Service: Gastroenterology;  Laterality: N/A;  . EYE SURGERY Right   . HERNIA REPAIR    . L Lap herniorraphy  04/2000  . Left wrist ganglionectomy       SOCIAL HISTORY: Social History   Socioeconomic History  . Marital status: Single    Spouse name: Not on file  . Number of children: 1  . Years of education: Not on file  . Highest education level: Not on file  Occupational History  . Occupation: [employer]  Tobacco Use  . Smoking status: Never Smoker  . Smokeless tobacco: Never Used  . Tobacco comment: Occassionally  Vaping Use  . Vaping Use: Never used  Substance and Sexual Activity  . Alcohol use: Not Currently    Alcohol/week: 3.0 standard drinks    Types: 3 Cans of beer per week  . Drug use: No  . Sexual activity: Not on file  Other Topics Concern  . Not on file  Social History Narrative   Divorced 12/27/2007, dating as of 12/26/2017   His daughter died 4 days after giving birth to the patient's granddaughter   Has joint custody of his dead daughter's child   Works at [employer] is [employer]:   . Difficulty of Paying Living Expenses: Not on file  Food Insecurity:   . Worried About Charity fundraiser in the Last Year: Not on file  . Ran Out of Food in the Last Year: Not on file  Transportation Needs:   . Lack of Transportation (Medical): Not on file  . Lack of Transportation (Non-Medical): Not on file  Physical Activity:   . Days  of Exercise per Week: Not on file  . Minutes of Exercise per Session: Not on file  Stress:   . Feeling of Stress : Not on file  Social Connections:   . Frequency of Communication with Friends and Family: Not on file  . Frequency of Social Gatherings with Friends and Family: Not on file  . Attends Religious Services: Not on file  . Active Member of Clubs or Organizations: Not on file  . Attends Archivist Meetings: Not on file  . Marital Status: Not on file  Intimate Partner Violence:   . Fear of Current or Ex-Partner: Not on file  . Emotionally Abused: Not on file  . Physically Abused: Not on file  . Sexually  Abused: Not on file    FAMILY HISTORY: Family History  Problem Relation Age of Onset  . Hypertension Mother   . Diabetes Mother   . Heart disease Mother        CAD  . Dementia Mother   . Hypertension Father   . Heart disease Father        MI 02/03  . Colon cancer Father   . Hypertension Sister   . Hypertension Sister   . Hypertension Sister   . Prostate cancer Neg Hx     ALLERGIES:  is allergic to bactrim [sulfamethoxazole-trimethoprim], clonidine derivatives, and tadalafil.  MEDICATIONS:  Current Outpatient Medications  Medication Sig Dispense Refill  . acetaminophen (TYLENOL) 325 MG tablet Take 650 mg by mouth every 6 (six) hours as needed.    Marland Kitchen acyclovir (ZOVIRAX) 400 MG tablet Take 1 tablet (400 mg total) by mouth 2 (two) times daily. 60 tablet 11  . amLODipine (NORVASC) 5 MG tablet TAKE 1-2 TABLETS (5-10 MG TOTAL) BY MOUTH DAILY. 180 tablet 3  . B Complex Vitamins (VITAMIN B COMPLEX PO) SMARTSIG:By Mouth    . clopidogrel (PLAVIX) 75 MG tablet Take by mouth.    . co-enzyme Q-10 50 MG capsule Take 50 mg by mouth daily.    . cyanocobalamin ([employer] VITAMIN B12) 1000 MCG tablet Take 1 tablet (1,000 mcg total) by mouth daily. 90 tablet 3  . cyclobenzaprine (FLEXERIL) 5 MG tablet Take 1 tablet (5 mg total) by mouth 3 (three) times daily as needed for muscle spasms. 30 tablet 0  . famotidine (PEPCID) 20 MG tablet Take 20 mg by mouth daily.     . hydrALAZINE (APRESOLINE) 10 MG tablet Take 1 tablet (10 mg total) by mouth 3 (three) times daily. Please make an appointment for physical exam prior to further refills. 270 tablet 0  . ixazomib citrate (NINLARO) 3 MG capsule Take 1 capsule (3 mg) by mouth weekly, 3 weeks on, 1 week off, repeat every 4 weeks. Take on an empty stomach 1hr before or 2hr after meals. 3 capsule 3  . lisinopril (ZESTRIL) 20 MG tablet Take 1 tablet (20 mg total) by mouth daily. Please make an appointment for physical exam prior to further refills. 90 tablet 0  .  LORazepam (ATIVAN) 1 MG tablet TAKE 1 TABLET (1 MG TOTAL) BY MOUTH EVERY 6 (SIX) HOURS AS NEEDED FOR ANXIETY (FOR NAUSEA)    . magnesium chloride (SLOW-MAG) 64 MG TBEC SR tablet Take 1 tablet (64 mg total) by mouth daily. 60 tablet 0  . metoprolol succinate (TOPROL-XL) 50 MG 24 hr tablet Take 4 tablets (200 mg total) by mouth daily. Take with or immediately following a meal. 360 tablet 1  . ondansetron (ZOFRAN) 8 MG tablet Take 8  mg by mouth every 8 (eight) hours as needed. for nausea    . pravastatin (PRAVACHOL) 10 MG tablet Take 2 tablets (20 mg total) by mouth daily.    . prochlorperazine (COMPAZINE) 10 MG tablet TAKE 1 TABLET (10 MG TOTAL) BY MOUTH EVERY 6 (SIX) HOURS AS NEEDED FOR NAUSEA    . zinc gluconate 50 MG tablet Take 50 mg by mouth daily.     No current facility-administered medications for this visit.     PHYSICAL EXAMINATION: ECOG PERFORMANCE STATUS: 1 - Symptomatic but completely ambulatory Vitals:   07/20/20 0926  BP: 130/84  Pulse: 61  Resp: 18  Temp: (!) 97.2 F (36.2 C)   Filed Weights   07/20/20 0926  Weight: 205 lb 6.4 oz (93.2 kg)    Physical Exam Constitutional:      General: He is not in acute distress. HENT:     Head: Normocephalic and atraumatic.  Eyes:     General: No scleral icterus. Cardiovascular:     Rate and Rhythm: Normal rate and regular rhythm.     Heart sounds: Normal heart sounds.  Pulmonary:     Effort: Pulmonary effort is normal. No respiratory distress.     Breath sounds: No wheezing.  Abdominal:     General: Bowel sounds are normal. There is no distension.     Palpations: Abdomen is soft.  Musculoskeletal:        General: No deformity. Normal range of motion.     Cervical back: Normal range of motion and neck supple.  Skin:    General: Skin is warm and dry.     Findings: No erythema or rash.  Neurological:     Mental Status: He is alert and oriented to person, place, and time. Mental status is at baseline.     Cranial  Nerves: No cranial nerve deficit.     Coordination: Coordination normal.  Psychiatric:        Mood and Affect: Mood normal.      LABORATORY DATA:  I have reviewed the data as listed Lab Results  Component Value Date   WBC 5.7 07/20/2020   HGB 11.3 (L) 07/20/2020   HCT 32.5 (L) 07/20/2020   MCV 99.7 07/20/2020   PLT 69 (L) 07/20/2020   Recent Labs    04/27/20 1016 04/27/20 1016 05/30/20 0944 06/22/20 0847 07/20/20 0844  NA 138   < > 138 139 140  K 4.0   < > 4.4 4.5 4.1  CL 103   < > 104 103 108  CO2 26   < > _0 GLUCOSE 103*   < > 102* 104* 110*  BUN 23   < > 20 24* 24*  CREATININE 1.41*   < > 1.29* 1.55* 1.35*  CALCIUM 9.6   < > 9.2 9.8 9.0  GFRNONAA 52*   < > 58* 47* 55*  GFRAA >60  --  >60 54*  --   PROT 7.8   < > 7.4 7.6 6.9  ALBUMIN 4.7   < > 4.4 4.3 4.0  AST 24   < > _1 ALT 24   < > _2 ALKPHOS 62   < > 66 59 53  BILITOT 0.9   < > 0.8 0.8 0.9   < > = values in this interval not displayed.   Iron/TIBC/Ferritin/ %Sat    Component Value Date/Time   IRON 74 07/01/2018 0944   TIBC 250 07/01/2018 0944  FERRITIN 357 07/01/2018 0944   IRONPCTSAT 30 07/01/2018 0944    Lab Results  Component Value Date   TOTALPROTELP 7.3 06/22/2020   ALBUMINELP 3.7 03/10/2019   A1GS 0.3 03/10/2019   A2GS 0.8 03/10/2019   BETS 1.3 03/10/2019   GAMS 0.6 03/10/2019   MSPIKE Not Observed 03/10/2019   SPEI Comment 03/10/2019   Lab Results  Component Value Date   KPAFRELGTCHN 46.5 (H) 06/22/2020   LAMBDASER 14.2 06/22/2020   KAPLAMBRATIO 3.27 (H) 06/22/2020    RADIOGRAPHIC STUDIES: I have personally reviewed the radiological images as listed and agreed with the findings in the report. 10/23/2018 FINDINGS: The heart size and mediastinal contours are within normal limits. Both lungs are clear. The visualized skeletal structures are unremarkable. IMPRESSION: No active cardiopulmonary disease.   ASSESSMENT & PLAN:  1. Multiple myeloma, remission  status unspecified ([place])   2. Thrombocytopenia ([place])   3. Encounter for antineoplastic chemotherapy   4. Stage 3a chronic kidney disease ([place])   5. Bone lesion    #Light chain multiple myeloma beta 2 microglobulin 5 and normal albumin.  Stage II. cytogenetics showed normal male chromosome, MDS FISH panel showed trisomy 60- Standard Risk. S/p RVD x 9 and  Autologous bone marrow stem cell transplant at [facility] on 04/23/2019. He has not had a post bone marrow transplant bone marrow biopsy yet.   Labs are reviewed and discussed with patient Counts are acceptable to proceed with next cycle of Ixizomab.   # Lumbar lesion,  I discussed with [facility] Dr.Choi who will arrange him to have lumbar MRI and refer to [facility].  Will obtain bone marrow biopsy, patient prefers to have biopsy done locally. Will arrange.    Thrombocytopenia, slightly lower than his baseline. Repeat cbc in 2 weeks.  Chronic kidney disease, creatinine is stable. Encourage oral hydration. Chronic anemia, hemoglobin stable at 11.3  Post bone marrow transplant care, Continue acyclovir 400 mg twice daily  [facility].   #Bone health/osteopenia/multiple myeloma-  Continue Xgeva monthly for 2 years, Xgeva next week.  Continue vitamin D and calcium supplementation.   Follow up 4 weeks for evaluation prior to starting next cycle of chemotherapy for maintenance.  Earlie Server, MD, PhD Hematology Oncology [place] at [facility] 07/20/2020

## 2020-07-20 NOTE — Progress Notes (Signed)
Patient reports neck pain is at a 2/10 today but was 6-7/10 early this morning.  Had a CT scan yesterday at [facility].

## 2020-07-21 ENCOUNTER — Other Ambulatory Visit: Payer: Self-pay

## 2020-07-21 DIAGNOSIS — C9000 Multiple myeloma not having achieved remission: Secondary | ICD-10-CM

## 2020-07-21 LAB — KAPPA/LAMBDA LIGHT CHAINS
Kappa free light chain: 41.2 mg/L — ABNORMAL HIGH (ref 3.3–19.4)
Kappa, lambda light chain ratio: 2.88 — ABNORMAL HIGH (ref 0.26–1.65)
Lambda free light chains: 14.3 mg/L (ref 5.7–26.3)

## 2020-07-24 LAB — MULTIPLE MYELOMA PANEL, SERUM
Albumin SerPl Elph-Mcnc: 3.5 g/dL (ref 2.9–4.4)
Albumin/Glob SerPl: 1.3 (ref 0.7–1.7)
Alpha 1: 0.2 g/dL (ref 0.0–0.4)
Alpha2 Glob SerPl Elph-Mcnc: 0.6 g/dL (ref 0.4–1.0)
B-Globulin SerPl Elph-Mcnc: 0.9 g/dL (ref 0.7–1.3)
Gamma Glob SerPl Elph-Mcnc: 1 g/dL (ref 0.4–1.8)
Globulin, Total: 2.7 g/dL (ref 2.2–3.9)
IgA: 148 mg/dL (ref 61–437)
IgG (Immunoglobin G), Serum: 989 mg/dL (ref 603–1613)
IgM (Immunoglobulin M), Srm: 27 mg/dL (ref 20–172)
Total Protein ELP: 6.2 g/dL (ref 6.0–8.5)

## 2020-07-24 NOTE — Progress Notes (Signed)
Patient on schedule for BMB 07/28/2020, spoke with patient on phone. Made aware to be here @ 0730, NPO after MN, as well as have driver for discharge post procedure/recovery. Stated understanding.

## 2020-07-25 ENCOUNTER — Inpatient Hospital Stay: Payer: BC Managed Care – PPO

## 2020-07-25 ENCOUNTER — Other Ambulatory Visit: Payer: Self-pay | Admitting: *Deleted

## 2020-07-25 ENCOUNTER — Other Ambulatory Visit: Payer: Self-pay

## 2020-07-25 DIAGNOSIS — N179 Acute kidney failure, unspecified: Secondary | ICD-10-CM

## 2020-07-25 DIAGNOSIS — D696 Thrombocytopenia, unspecified: Secondary | ICD-10-CM | POA: Diagnosis not present

## 2020-07-25 DIAGNOSIS — M8580 Other specified disorders of bone density and structure, unspecified site: Secondary | ICD-10-CM | POA: Diagnosis not present

## 2020-07-25 DIAGNOSIS — D631 Anemia in chronic kidney disease: Secondary | ICD-10-CM | POA: Diagnosis not present

## 2020-07-25 DIAGNOSIS — C9000 Multiple myeloma not having achieved remission: Secondary | ICD-10-CM

## 2020-07-25 DIAGNOSIS — Z9481 Bone marrow transplant status: Secondary | ICD-10-CM | POA: Diagnosis not present

## 2020-07-25 DIAGNOSIS — I129 Hypertensive chronic kidney disease with stage 1 through stage 4 chronic kidney disease, or unspecified chronic kidney disease: Secondary | ICD-10-CM | POA: Diagnosis not present

## 2020-07-25 DIAGNOSIS — Z79899 Other long term (current) drug therapy: Secondary | ICD-10-CM | POA: Diagnosis not present

## 2020-07-25 DIAGNOSIS — K760 Fatty (change of) liver, not elsewhere classified: Secondary | ICD-10-CM | POA: Diagnosis not present

## 2020-07-25 DIAGNOSIS — N183 Chronic kidney disease, stage 3 unspecified: Secondary | ICD-10-CM | POA: Diagnosis not present

## 2020-07-25 MED ORDER — IXAZOMIB CITRATE 3 MG PO CAPS: ORAL_CAPSULE | ORAL | 3 refills | Status: DC

## 2020-07-25 MED ORDER — DENOSUMAB 120 MG/1.7ML ~~LOC~~ SOLN
120.0000 mg | Freq: Once | SUBCUTANEOUS | Status: AC
  Administered 2020-07-25: 120 mg via SUBCUTANEOUS
  Filled 2020-07-25: qty 1.7

## 2020-07-27 ENCOUNTER — Other Ambulatory Visit: Payer: Self-pay | Admitting: Student

## 2020-07-28 ENCOUNTER — Other Ambulatory Visit: Payer: Self-pay

## 2020-07-28 ENCOUNTER — Ambulatory Visit
Admission: RE | Admit: 2020-07-28 | Discharge: 2020-07-28 | Disposition: A | Discharge status: Home / Self Care | Payer: BC Managed Care – PPO | Source: Ambulatory Visit | Attending: Oncology | Admitting: Oncology

## 2020-07-28 DIAGNOSIS — D696 Thrombocytopenia, unspecified: Secondary | ICD-10-CM | POA: Diagnosis not present

## 2020-07-28 DIAGNOSIS — I10 Essential (primary) hypertension: Secondary | ICD-10-CM | POA: Diagnosis not present

## 2020-07-28 DIAGNOSIS — Z808 Family history of malignant neoplasm of other organs or systems: Secondary | ICD-10-CM | POA: Diagnosis not present

## 2020-07-28 DIAGNOSIS — Z7902 Long term (current) use of antithrombotics/antiplatelets: Secondary | ICD-10-CM | POA: Insufficient documentation

## 2020-07-28 DIAGNOSIS — Z7901 Long term (current) use of anticoagulants: Secondary | ICD-10-CM | POA: Insufficient documentation

## 2020-07-28 DIAGNOSIS — C9000 Multiple myeloma not having achieved remission: Secondary | ICD-10-CM | POA: Diagnosis not present

## 2020-07-28 DIAGNOSIS — K760 Fatty (change of) liver, not elsewhere classified: Secondary | ICD-10-CM | POA: Diagnosis not present

## 2020-07-28 DIAGNOSIS — D649 Anemia, unspecified: Secondary | ICD-10-CM | POA: Diagnosis not present

## 2020-07-28 DIAGNOSIS — E785 Hyperlipidemia, unspecified: Secondary | ICD-10-CM | POA: Diagnosis not present

## 2020-07-28 DIAGNOSIS — Z79899 Other long term (current) drug therapy: Secondary | ICD-10-CM | POA: Diagnosis not present

## 2020-07-28 DIAGNOSIS — D72822 Plasmacytosis: Secondary | ICD-10-CM | POA: Diagnosis not present

## 2020-07-28 LAB — CBC WITH DIFFERENTIAL/PLATELET
Abs Immature Granulocytes: 0.01 10*3/uL (ref 0.00–0.07)
Basophils Absolute: 0 10*3/uL (ref 0.0–0.1)
Basophils Relative: 0 %
Eosinophils Absolute: 0.1 10*3/uL (ref 0.0–0.5)
Eosinophils Relative: 1 %
HCT: 38.6 % — ABNORMAL LOW (ref 39.0–52.0)
Hemoglobin: 13 g/dL (ref 13.0–17.0)
Immature Granulocytes: 0 %
Lymphocytes Relative: 34 %
Lymphs Abs: 1.9 10*3/uL (ref 0.7–4.0)
MCH: 34.5 pg — ABNORMAL HIGH (ref 26.0–34.0)
MCHC: 33.7 g/dL (ref 30.0–36.0)
MCV: 102.4 fL — ABNORMAL HIGH (ref 80.0–100.0)
Monocytes Absolute: 0.6 10*3/uL (ref 0.1–1.0)
Monocytes Relative: 10 %
Neutro Abs: 3.1 10*3/uL (ref 1.7–7.7)
Neutrophils Relative %: 55 %
Platelets: 93 10*3/uL — ABNORMAL LOW (ref 150–400)
RBC: 3.77 MIL/uL — ABNORMAL LOW (ref 4.22–5.81)
RDW: 13.5 % (ref 11.5–15.5)
WBC: 5.7 10*3/uL (ref 4.0–10.5)
nRBC: 0 % (ref 0.0–0.2)

## 2020-07-28 IMAGING — CT CT BIOPSY AND ASPIRATION BONE MARROW
1 of 2 series · 10 of 14 positions shown, 13 images · non-contrast
Comparison: none

INDICATION: 64-year-old with history of myeloma and needs a bone marrow for
follow-up evaluation.

[Series 2: i-spiral 5.0 b30f · axial · 0.75mm/px · z∈[-160,-65]mm · 10 of 35 slices shown, 13 images]
[im 4/35  soft-tissue]
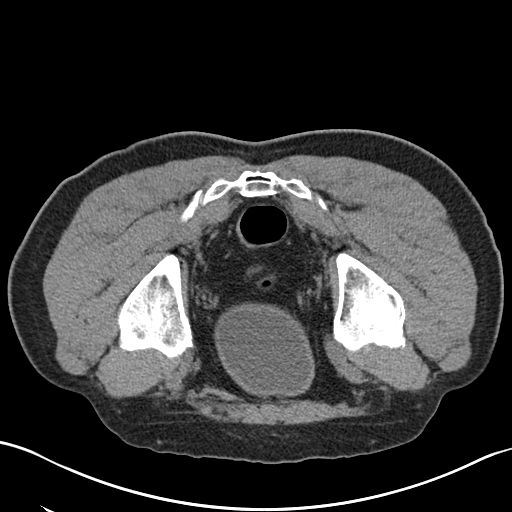
[im 4/35  bone]
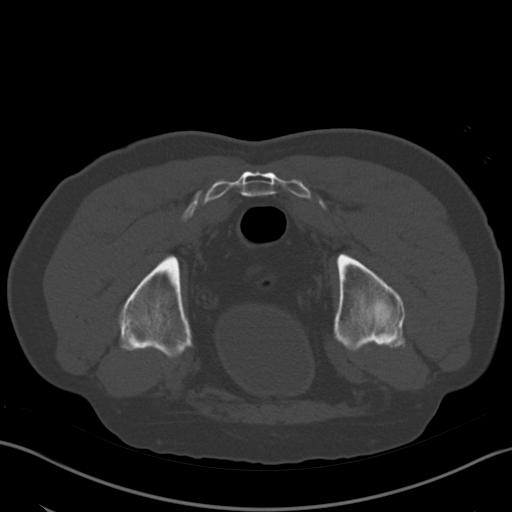
[im 7/35  bone]
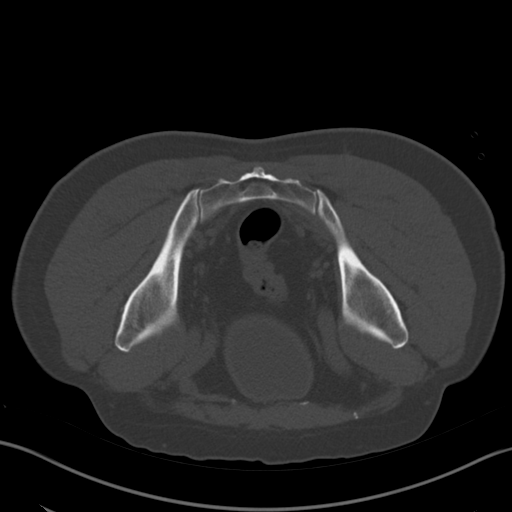
[im 10/35  bone]
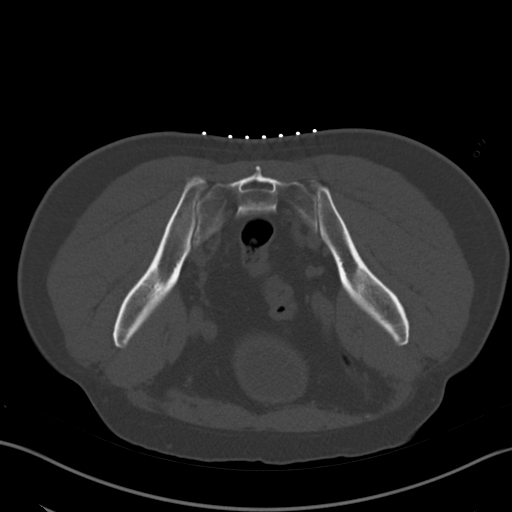
[im 13/35  bone]
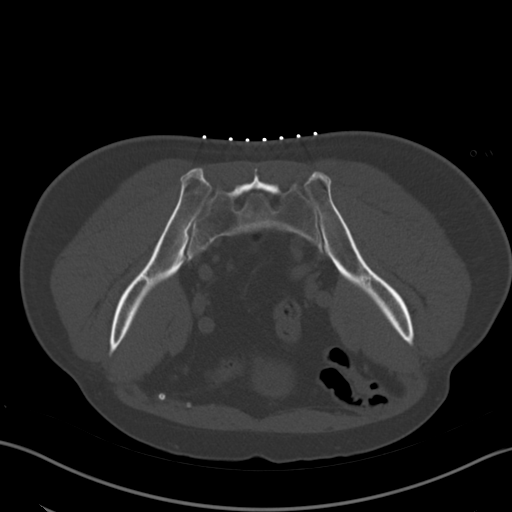
[im 16/35  soft-tissue]
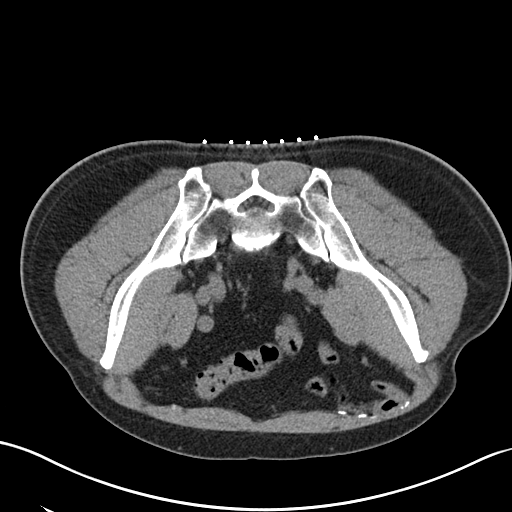
[im 16/35  bone]
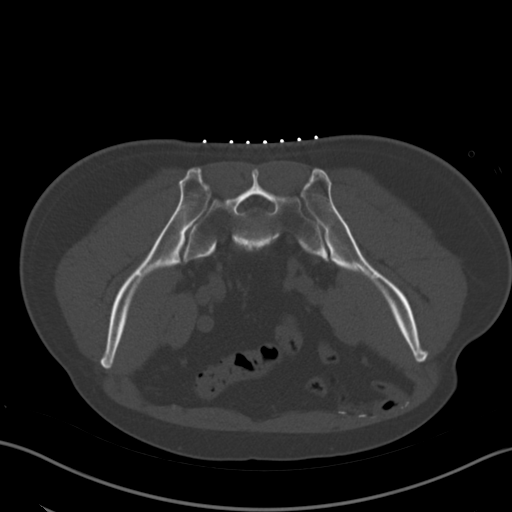
[im 19/35  bone]
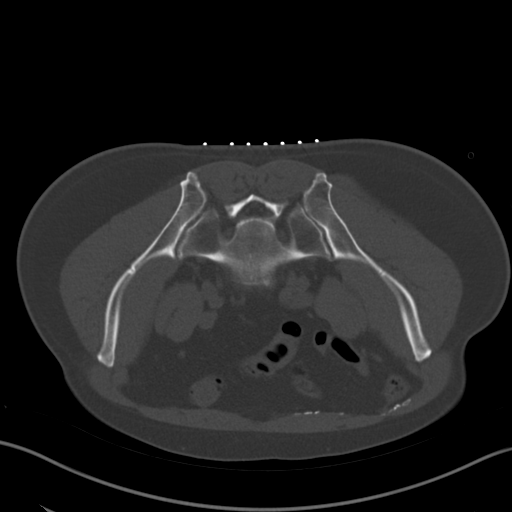
[im 22/35  bone]
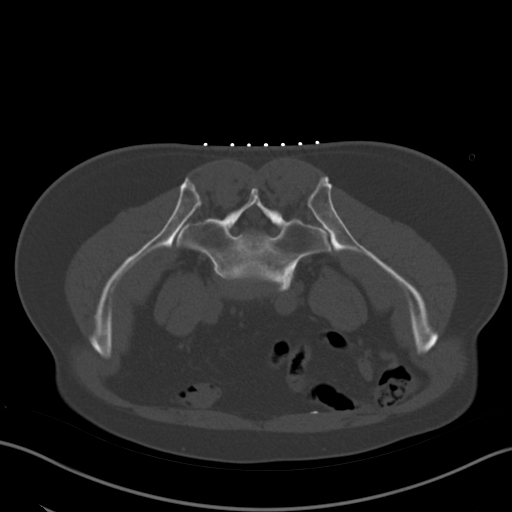
[im 25/35  bone]
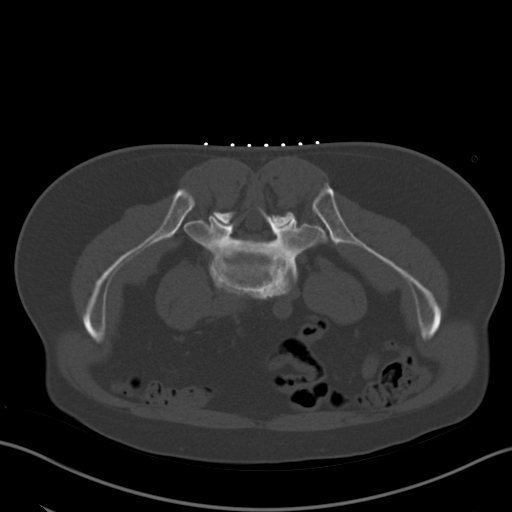
[im 28/35  soft-tissue]
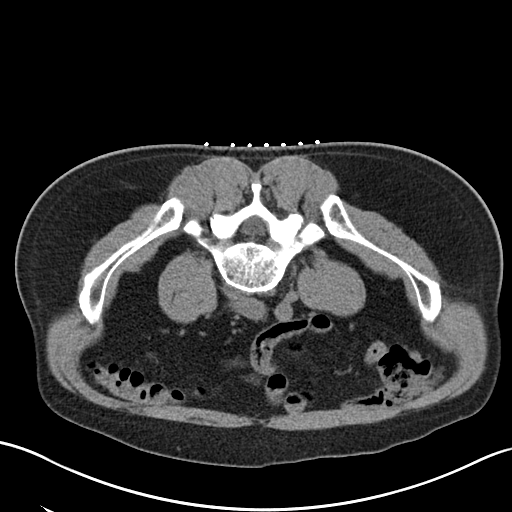
[im 28/35  bone]
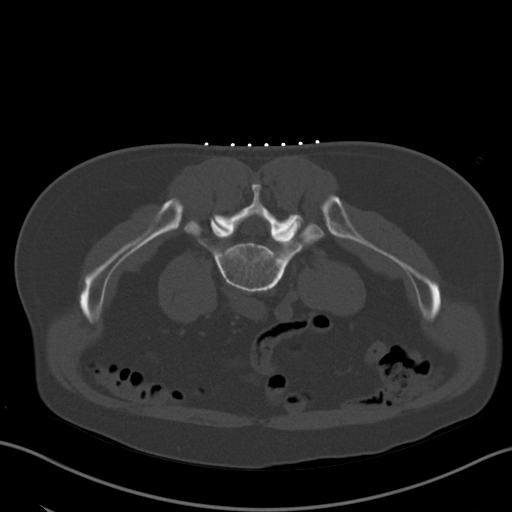
[im 31/35  bone]
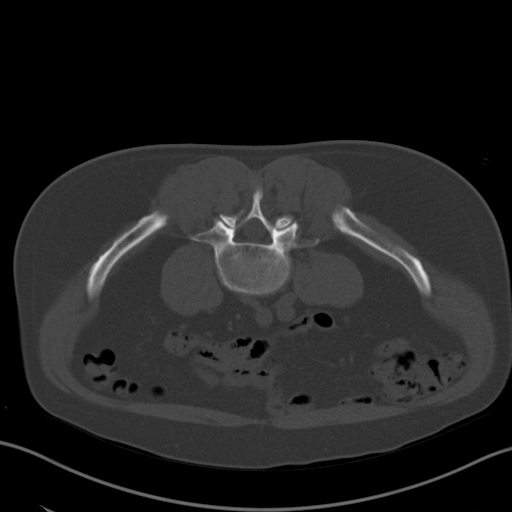

[10 of 14 positions shown; findings below may reference images not displayed]

EXAM:
CT GUIDED BONE MARROW ASPIRATES AND BIOPSY

MEDICATIONS:
None.

ANESTHESIA/SEDATION:
Fentanyl 100 mcg IV; Versed 2.0 mg IV

Moderate Sedation Time:  13 minutes

The patient was continuously monitored during the procedure by the
interventional radiology nurse under my direct supervision.

COMPLICATIONS:
None immediate.

PROCEDURE:
The procedure was explained to the patient. The risks and benefits
of the procedure were discussed and the patient's questions were
addressed. Informed consent was obtained from the patient. The
patient was placed prone on CT table. Images of the pelvis were
obtained. The right side of back was prepped and draped in sterile
fashion. The skin and right posterior ilium were anesthetized with
1% lidocaine. 11 gauge bone needle was directed into the right ilium
with CT guidance. Two aspirates and one core biopsy were obtained.
Bandage placed over the puncture site.
FINDINGS: CT images through the pelvis demonstrate surgical mesh material
along the lower anterior abdominal wall. Biopsy needle was directed
into the posterior right ilium.
IMPRESSION: CT guided bone marrow aspiration and core biopsy.

## 2020-07-28 MED ORDER — MIDAZOLAM HCL 2 MG/2ML IJ SOLN
INTRAMUSCULAR | Status: AC | PRN
  Administered 2020-07-28 (×2): 1 mg via INTRAVENOUS

## 2020-07-28 MED ORDER — MIDAZOLAM HCL 2 MG/2ML IJ SOLN
INTRAMUSCULAR | Status: AC
  Filled 2020-07-28: qty 2

## 2020-07-28 MED ORDER — HEPARIN SOD (PORK) LOCK FLUSH 100 UNIT/ML IV SOLN
INTRAVENOUS | Status: AC
  Filled 2020-07-28: qty 5

## 2020-07-28 MED ORDER — FENTANYL CITRATE (PF) 100 MCG/2ML IJ SOLN
INTRAMUSCULAR | Status: AC | PRN
  Administered 2020-07-28 (×2): 50 ug via INTRAVENOUS

## 2020-07-28 MED ORDER — FENTANYL CITRATE (PF) 100 MCG/2ML IJ SOLN
INTRAMUSCULAR | Status: AC
  Filled 2020-07-28: qty 2

## 2020-07-28 NOTE — H&P (Signed)
Chief Complaint: Follow up evaluation for multiple myeloma. Request is for bone marrow biopsy  Referring Physician(s): Yu,Zhou  Supervising Physician: Markus Daft  Patient Status: [facility] - Out-pt  History of Present Illness: Luis Burke is a 65 y.o. male 64 y.o. male outpatient . History of HLD, HTN, Fatty liver. Patient was found to have multiple myeloma s/p stem cell transplant while being worked up for anemia and thrombocytopenia.  Prior bone biopsy performed by IR  on 11.6.19 showed plasma cell myeloma with FISH. Team is requesting bone marrow biopsy for follow up purposes related to multiple myeloma.  Patient denies any complaints at this time. Patient states that he is happy the procedure is not at [facility] where they do not provide conscious sedation.   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   Fatty liver    on u/s 07/2018   Hyperlipidemia 03/2004   Hypertension 1994   Snores     Past Surgical History:  Procedure Laterality Date   CATARACT EXTRACTION W/PHACO Right 03/04/2018   Procedure: CATARACT EXTRACTION PHACO AND INTRAOCULAR LENS PLACEMENT ([place])  RIGHT TORIC;  Surgeon: Leandrew Koyanagi, MD;  Location: [place];  Service: Ophthalmology;  Laterality: Right;  Per [place] no Toric Lens 1:45 5.3   COLONOSCOPY WITH PROPOFOL N/A 07/20/2018   Procedure: COLONOSCOPY WITH PROPOFOL;  Surgeon: Jonathon Bellows, MD;  Location: [facility] ENDOSCOPY;  Service: Gastroenterology;  Laterality: N/A;   ESOPHAGOGASTRODUODENOSCOPY (EGD) WITH PROPOFOL N/A 07/20/2018   Procedure: ESOPHAGOGASTRODUODENOSCOPY (EGD) WITH PROPOFOL;  Surgeon: Jonathon Bellows, MD;  Location: [facility] ENDOSCOPY;  Service: Gastroenterology;  Laterality: N/A;   EYE SURGERY Right    HERNIA REPAIR     L Lap herniorraphy  04/2000   Left wrist ganglionectomy       Allergies: Bactrim [sulfamethoxazole-trimethoprim], Clonidine derivatives, and Tadalafil  Medications: Prior to Admission medications   Medication Sig Start Date End Date Taking? Authorizing Provider  acetaminophen (TYLENOL) 325 MG tablet Take 650 mg by mouth every 6 (six) hours as needed.   Yes [provider]  acyclovir (ZOVIRAX) 400 MG tablet Take 1 tablet (400 mg total) by mouth 2 (two) times daily. 07/21/19  Yes Earlie Server, MD  amLODipine (NORVASC) 5 MG tablet TAKE 1-2 TABLETS (5-10 MG TOTAL) BY MOUTH DAILY. 10/29/19  Yes Tonia Ghent, MD  B Complex Vitamins (VITAMIN B COMPLEX PO) SMARTSIG:By Mouth 11/17/19  Yes [provider]  clopidogrel (PLAVIX) 75 MG tablet Take by mouth.   Yes [provider]  co-enzyme Q-10 50 MG capsule Take 50 mg by mouth daily.   Yes [provider]  cyanocobalamin ([employer] VITAMIN B12) 1000 MCG tablet Take 1 tablet (1,000 mcg total) by mouth daily. 08/03/19  Yes Tonia Ghent, MD  cyclobenzaprine (FLEXERIL) 5 MG tablet Take 1 tablet (5 mg total) by mouth 3 (three) times daily as needed for muscle spasms. 03/03/19  Yes Earlie Server, MD  famotidine (PEPCID) 20 MG tablet Take 20 mg by mouth daily.  04/29/19  Yes [provider]  hydrALAZINE (APRESOLINE) 10 MG tablet Take 1 tablet (10 mg total) by mouth 3 (three) times daily. Please make an appointment for physical exam prior to further refills. 05/22/20  Yes Tonia Ghent, MD  ixazomib citrate (NINLARO) 3 MG capsule Take 1 capsule (3 mg) by  mouth weekly, 3 weeks on, 1 week off, repeat every 4 weeks. Take on an empty stomach 1hr before or 2hr after meals. 07/25/20  Yes Earlie Server, MD  lisinopril (ZESTRIL) 20 MG tablet Take 1 tablet (20 mg total) by mouth daily. Please make an appointment for physical exam prior to further refills. 07/03/20  Yes Tonia Ghent, MD  LORazepam (ATIVAN) 1 MG tablet TAKE 1 TABLET (1 MG TOTAL) BY MOUTH EVERY 6 (SIX) HOURS AS NEEDED FOR ANXIETY (FOR  NAUSEA) 04/14/19  Yes [provider]  magnesium chloride (SLOW-MAG) 64 MG TBEC SR tablet Take 1 tablet (64 mg total) by mouth daily. 06/22/19  Yes Earlie Server, MD  metoprolol succinate (TOPROL-XL) 50 MG 24 hr tablet Take 4 tablets (200 mg total) by mouth daily. Take with or immediately following a meal. 02/10/20  Yes Tonia Ghent, MD  ondansetron (ZOFRAN) 8 MG tablet Take 8 mg by mouth every 8 (eight) hours as needed. for nausea 04/14/19  Yes [provider]  pravastatin (PRAVACHOL) 10 MG tablet Take 2 tablets (20 mg total) by mouth daily. 11/26/19  Yes Tonia Ghent, MD  prochlorperazine (COMPAZINE) 10 MG tablet TAKE 1 TABLET (10 MG TOTAL) BY MOUTH EVERY 6 (SIX) HOURS AS NEEDED FOR NAUSEA 04/14/19  Yes [provider]  zinc gluconate 50 MG tablet Take 50 mg by mouth daily.   Yes [provider]     Family History  Problem Relation Age of Onset   Hypertension Mother    Diabetes Mother    Heart disease Mother        CAD   Dementia Mother    Hypertension Father    Heart disease Father        MI 02/03   Colon cancer Father    Hypertension Sister    Hypertension Sister    Hypertension Sister    Prostate cancer Neg Hx     Social History   Socioeconomic History   Marital status: Single    Spouse name: Not on file   Number of children: 1   Years of education: Not on file   Highest education level: Not on file  Occupational History   Occupation: [employer]  Tobacco Use   Smoking status: Never Smoker   Smokeless tobacco: Never Used   Tobacco comment: Occassionally  Vaping Use   Vaping Use: Never used  Substance and Sexual Activity   Alcohol use: Not Currently    Alcohol/week: 3.0 standard drinks    Types: 3 Cans of beer per week   Drug use: No   Sexual activity: Not on file  Other Topics Concern   Not on file  Social History Narrative   Divorced 2007/12/07, dating as of 12/06/17   His daughter died 4 days after giving birth to  the patient's granddaughter   Has joint custody of his dead daughter's child   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 Strain:    Difficulty of Paying Living Expenses: Not on [employer] Insecurity:    Worried About Charity fundraiser in the Last Year: Not on file   [employer] of Food in the Last Year: Not on file  Transportation Needs:    Film/video editor (Medical): Not on file   Lack of Transportation (Non-Medical): Not on file  Physical Activity:    Days of Exercise per Week: Not on file   Minutes of Exercise per  Session: Not on file  Stress:    Feeling of Stress : Not on file  Social Connections:    Frequency of Communication with Friends and Family: Not on file   Frequency of Social Gatherings with Friends and Family: Not on file   Attends Religious Services: Not on file   Active Member of Clubs or Organizations: Not on file   Attends Archivist Meetings: Not on file   Marital Status: Not on file     Review of Systems: A 12 point ROS discussed and pertinent positives are indicated in the HPI above.  All other systems are negative.  Review of Systems  Constitutional: Negative for fever.  HENT: Negative for congestion.   Respiratory: Negative for cough and shortness of breath.   Cardiovascular: Negative for chest pain.  Gastrointestinal: Negative for abdominal pain.  Neurological: Negative for headaches.  Psychiatric/Behavioral: Negative for behavioral problems and confusion.    Vital Signs: BP (!) 172/108    Pulse 70    Temp 98.2 F (36.8 C) (Oral)    Resp 20    Ht 5' 10" (1.778 m)    Wt 204 lb (92.5 kg)    SpO2 96%    BMI 29.27 kg/m   Physical Exam Vitals and nursing note reviewed.  Constitutional:      Appearance: He is well-developed.  HENT:     Head: Normocephalic.  Cardiovascular:     Rate and Rhythm: Normal rate and regular rhythm.     Heart sounds: Normal heart sounds.    Pulmonary:     Effort: Pulmonary effort is normal.     Breath sounds: Normal breath sounds.  Musculoskeletal:        General: Normal range of motion.     Cervical back: Normal range of motion.  Skin:    General: Skin is dry.  Neurological:     Mental Status: He is alert and oriented to person, place, and time.     Imaging: No results found.  Labs:  CBC: Recent Labs    04/27/20 1016 05/30/20 0944 06/22/20 0847 07/20/20 0844  WBC 4.5 5.1 5.6 5.7  HGB 12.9* 12.6* 12.6* 11.3*  HCT 36.4* 36.1* 37.0* 32.5*  PLT 71* 71* 81* 69*    COAGS: No results for input(s): INR, APTT in the last 8760 hours.  BMP: Recent Labs    03/01/20 0939 03/01/20 0939 04/27/20 1016 05/30/20 0944 06/22/20 0847 07/20/20 0844  NA 141   < > 138 138 139 140  K 4.4   < > 4.0 4.4 4.5 4.1  CL 105   < > 103 104 103 108  CO2 27   < > _0 GLUCOSE 106*   < > 103* 102* 104* 110*  BUN 18   < > 23 20 24* 24*  CALCIUM 9.4   < > 9.6 9.2 9.8 9.0  CREATININE 1.35*   < > 1.41* 1.29* 1.55* 1.35*  GFRNONAA 55*   < > 52* 58* 47* 55*  GFRAA >60  --  >60 >60 54*  --    < > = values in this interval not displayed.    LIVER FUNCTION TESTS: Recent Labs    04/27/20 1016 05/30/20 0944 06/22/20 0847 07/20/20 0844  BILITOT 0.9 0.8 0.8 0.9  AST _1 ALT _2 ALKPHOS 62 66 59 53  PROT 7.8 7.4 7.6 6.9  ALBUMIN 4.7 4.4 4.3 4.0  Assessment and Plan:  64 y.o. male outpatient . History of HLD, HTN, Fatty liver. Patient was found to have multiple myeloma s/p stem cell transplant while being worked up for anemia and thrombocytopenia.  Prior bone biopsy performed by IR  on 11.6.19 showed plasma cell myeloma with FISH. Team is requesting bone marrow biopsy for follow up purposes related to multiple myeloma.   PLT 93. All other labs are within acceptable parameters. Plavix last dose given on 10.14.21. Patient is allergic to Bactrim. NPO since midnight.   Risks and benefits of bone  marrow biopsy was discussed with the patient and/or patient's family including, but not limited to bleeding, infection, damage to adjacent structures or low yield requiring additional tests.  All of the questions were answered and there is agreement to proceed.  Consent signed and in chart.    Thank you for this interesting consult.  I greatly enjoyed meeting AVELINO HERREN and look forward to participating in their care.  A copy of this report was sent to the requesting provider on this date.  Electronically Signed: Jacqualine Mau, NP 07/28/2020, 8:07 AM   I spent a total of  30 Minutes   in face to face in clinical consultation, greater than 50% of which was counseling/coordinating care for bone marrow biopsy

## 2020-07-28 NOTE — Progress Notes (Signed)
Patient clinically stable post BMB per Dr Anselm Pancoast, tolerated well. Denies complaints at this time. Vitals stable pre and post procedure. Received Versed 2 mg along with Fentanyl 100 mcg IV for procedure. Report given to [facility] R[facility] post procedure.

## 2020-07-28 NOTE — Procedures (Signed)
Interventional Radiology Procedure:   Indications: Multiple myeloma, remission status unspecified  Procedure: CT guided bone marrow biopsy  Findings: 2 aspirates and 1 core from right ilium  Complications: None     EBL: Minimal, less than 10 ml  Plan: Discharge to home in one hour.   Wilna Pennie R. Anselm Pancoast, MD  Pager: [phone]

## 2020-08-01 ENCOUNTER — Other Ambulatory Visit: Payer: Self-pay | Admitting: Family Medicine

## 2020-08-01 ENCOUNTER — Other Ambulatory Visit: Payer: Self-pay | Admitting: Oncology

## 2020-08-01 DIAGNOSIS — C9000 Multiple myeloma not having achieved remission: Secondary | ICD-10-CM

## 2020-08-02 ENCOUNTER — Encounter: Payer: Self-pay | Admitting: Oncology

## 2020-08-02 NOTE — Telephone Encounter (Signed)
Please schedule when possible.  Okay for it to be a virtual or phone visit.  Rx sent thanks.

## 2020-08-02 NOTE — Telephone Encounter (Signed)
Last office visit 03/08/20 with D. Tower for productive cough.  Last seen by PCP 08/03/19. Last refilled 02/10/20 for #360 with 1 RF.  No furtue appointments with PCP.

## 2020-08-03 ENCOUNTER — Other Ambulatory Visit: Payer: Self-pay

## 2020-08-03 ENCOUNTER — Inpatient Hospital Stay: Payer: BC Managed Care – PPO

## 2020-08-03 DIAGNOSIS — N183 Chronic kidney disease, stage 3 unspecified: Secondary | ICD-10-CM | POA: Diagnosis not present

## 2020-08-03 DIAGNOSIS — D696 Thrombocytopenia, unspecified: Secondary | ICD-10-CM | POA: Diagnosis not present

## 2020-08-03 DIAGNOSIS — K760 Fatty (change of) liver, not elsewhere classified: Secondary | ICD-10-CM | POA: Diagnosis not present

## 2020-08-03 DIAGNOSIS — C9000 Multiple myeloma not having achieved remission: Secondary | ICD-10-CM

## 2020-08-03 DIAGNOSIS — Z9481 Bone marrow transplant status: Secondary | ICD-10-CM | POA: Diagnosis not present

## 2020-08-03 DIAGNOSIS — Z79899 Other long term (current) drug therapy: Secondary | ICD-10-CM | POA: Diagnosis not present

## 2020-08-03 DIAGNOSIS — M8580 Other specified disorders of bone density and structure, unspecified site: Secondary | ICD-10-CM | POA: Diagnosis not present

## 2020-08-03 DIAGNOSIS — I129 Hypertensive chronic kidney disease with stage 1 through stage 4 chronic kidney disease, or unspecified chronic kidney disease: Secondary | ICD-10-CM | POA: Diagnosis not present

## 2020-08-03 DIAGNOSIS — D631 Anemia in chronic kidney disease: Secondary | ICD-10-CM | POA: Diagnosis not present

## 2020-08-03 LAB — CBC WITH DIFFERENTIAL/PLATELET
Abs Immature Granulocytes: 0.01 10*3/uL (ref 0.00–0.07)
Basophils Absolute: 0 10*3/uL (ref 0.0–0.1)
Basophils Relative: 0 %
Eosinophils Absolute: 0.1 10*3/uL (ref 0.0–0.5)
Eosinophils Relative: 1 %
HCT: 35.7 % — ABNORMAL LOW (ref 39.0–52.0)
Hemoglobin: 12.3 g/dL — ABNORMAL LOW (ref 13.0–17.0)
Immature Granulocytes: 0 %
Lymphocytes Relative: 29 %
Lymphs Abs: 1.3 10*3/uL (ref 0.7–4.0)
MCH: 34.5 pg — ABNORMAL HIGH (ref 26.0–34.0)
MCHC: 34.5 g/dL (ref 30.0–36.0)
MCV: 100 fL (ref 80.0–100.0)
Monocytes Absolute: 0.5 10*3/uL (ref 0.1–1.0)
Monocytes Relative: 11 %
Neutro Abs: 2.8 10*3/uL (ref 1.7–7.7)
Neutrophils Relative %: 59 %
Platelets: 72 10*3/uL — ABNORMAL LOW (ref 150–400)
RBC: 3.57 MIL/uL — ABNORMAL LOW (ref 4.22–5.81)
RDW: 13.2 % (ref 11.5–15.5)
WBC: 4.7 10*3/uL (ref 4.0–10.5)
nRBC: 0 % (ref 0.0–0.2)

## 2020-08-03 NOTE — Telephone Encounter (Signed)
Appointment scheduled.

## 2020-08-05 DIAGNOSIS — M4807 Spinal stenosis, lumbosacral region: Secondary | ICD-10-CM | POA: Diagnosis not present

## 2020-08-05 DIAGNOSIS — M899 Disorder of bone, unspecified: Secondary | ICD-10-CM | POA: Diagnosis not present

## 2020-08-05 DIAGNOSIS — C9000 Multiple myeloma not having achieved remission: Secondary | ICD-10-CM | POA: Diagnosis not present

## 2020-08-05 DIAGNOSIS — M47817 Spondylosis without myelopathy or radiculopathy, lumbosacral region: Secondary | ICD-10-CM | POA: Diagnosis not present

## 2020-08-08 ENCOUNTER — Encounter (HOSPITAL_COMMUNITY): Payer: Self-pay | Admitting: Oncology

## 2020-08-09 ENCOUNTER — Encounter: Payer: Self-pay | Admitting: Family Medicine

## 2020-08-09 DIAGNOSIS — M48061 Spinal stenosis, lumbar region without neurogenic claudication: Secondary | ICD-10-CM | POA: Insufficient documentation

## 2020-08-10 ENCOUNTER — Other Ambulatory Visit: Payer: Self-pay

## 2020-08-10 ENCOUNTER — Ambulatory Visit (INDEPENDENT_AMBULATORY_CARE_PROVIDER_SITE_OTHER): Payer: BC Managed Care – PPO | Admitting: Family Medicine

## 2020-08-10 ENCOUNTER — Encounter: Payer: Self-pay | Admitting: Family Medicine

## 2020-08-10 VITALS — BP 150/86 | HR 66 | Temp 96.6°F | Ht 70.0 in | Wt 203.4 lb

## 2020-08-10 DIAGNOSIS — E785 Hyperlipidemia, unspecified: Secondary | ICD-10-CM | POA: Diagnosis not present

## 2020-08-10 DIAGNOSIS — I779 Disorder of arteries and arterioles, unspecified: Secondary | ICD-10-CM

## 2020-08-10 DIAGNOSIS — M5136 Other intervertebral disc degeneration, lumbar region: Secondary | ICD-10-CM | POA: Insufficient documentation

## 2020-08-10 DIAGNOSIS — I10 Essential (primary) hypertension: Secondary | ICD-10-CM | POA: Diagnosis not present

## 2020-08-10 DIAGNOSIS — Z9481 Bone marrow transplant status: Secondary | ICD-10-CM | POA: Diagnosis not present

## 2020-08-10 MED ORDER — PRAVASTATIN SODIUM 10 MG PO TABS
10.0000 mg | ORAL_TABLET | Freq: Every day | ORAL | Status: DC
Start: 2020-08-10 — End: 2020-09-06

## 2020-08-10 NOTE — Progress Notes (Signed)
This visit occurred during the SARS-CoV-2 public health emergency.  Safety protocols were in place, including screening questions prior to the visit, additional usage of staff PPE, and extensive cleaning of exam room while observing appropriate contact time as indicated for disinfecting solutions.  Recent MRI d/w pt.   1. No evidence of spinal metastasis.  2. Lumbar spondylosis is most pronounced at L5-S1 where there is severe right and moderate left neural foraminal stenosis.   He isn't having lower back pain.  He has more neck pain at baseline and is taking tylenol at night.  He had arthritic changes in his neck noted on prev imaging.    He had diarrhea and vomiting last night.  This was an isolated event.  He feels well now.  No fevers.  He had atypical food last night that he thought was the trigger.  No blood in stool.  No dysuria.  No one else is sick.  We opted to defer flu shot today, out of caution.    He had covid booster shot.    We talked about his follow up with [facility] and local oncology.  We talked about stopping acyclovir.  Med list updated.  Hypertension:    Using medication without problems or lightheadedness: yes Chest pain with exertion:no Edema:no Short of breath:no Average home BPs:  Elevated Cholesterol: Using medications without problems: yes Muscle aches: some neck pain, likely not from statin.  Diet compliance: yes Exercise: encouraged.    We can recheck carotids later on, discussed with patient.  He agrees.  He is not quite due for recheck.    Meds, vitals, and allergies reviewed.   ROS: Per HPI unless specifically indicated in ROS section   GEN: nad, alert and oriented HEENT: ncat NECK: supple w/o LA CV: rrr. PULM: ctab, no inc wob ABD: soft, +bs EXT: no edema SKIN: no acute rash L 1st nail with chronic fungal changes (discussed options and he declined treatment at this point and I think that is reasonable.)  At least 30 minutes were devoted to  patient care in this encounter (this can potentially include time spent reviewing the patient's file/history, interviewing and examining the patient, counseling/reviewing plan with patient, ordering referrals, ordering tests, reviewing relevant laboratory or x-ray data, and documenting the encounter).

## 2020-08-10 NOTE — Patient Instructions (Addendum)
Ask Dr. Collie Siad clinic about drawing the lipid panel.  I put in the order.   Flu shot when feeling better for a few days.   Update me as needed.  I'll await the notes from oncology.  Stop acyclovir.  Let me know about your current dose of amlodipine.  Don't change your meds for now.  Let me know when you need refills.  Take care.  Glad to see you.

## 2020-08-11 DIAGNOSIS — M50221 Other cervical disc displacement at C4-C5 level: Secondary | ICD-10-CM | POA: Diagnosis not present

## 2020-08-11 DIAGNOSIS — M47812 Spondylosis without myelopathy or radiculopathy, cervical region: Secondary | ICD-10-CM | POA: Diagnosis not present

## 2020-08-11 DIAGNOSIS — M4312 Spondylolisthesis, cervical region: Secondary | ICD-10-CM | POA: Diagnosis not present

## 2020-08-11 DIAGNOSIS — C9000 Multiple myeloma not having achieved remission: Secondary | ICD-10-CM | POA: Diagnosis not present

## 2020-08-11 DIAGNOSIS — M542 Cervicalgia: Secondary | ICD-10-CM | POA: Insufficient documentation

## 2020-08-11 DIAGNOSIS — M899 Disorder of bone, unspecified: Secondary | ICD-10-CM | POA: Diagnosis not present

## 2020-08-11 DIAGNOSIS — G8929 Other chronic pain: Secondary | ICD-10-CM | POA: Insufficient documentation

## 2020-08-11 DIAGNOSIS — M5136 Other intervertebral disc degeneration, lumbar region: Secondary | ICD-10-CM | POA: Diagnosis not present

## 2020-08-13 NOTE — Assessment & Plan Note (Signed)
We can recheck carotids later on, discussed with patient.  He agrees.  He is not quite due for recheck.

## 2020-08-13 NOTE — Assessment & Plan Note (Addendum)
Per outside clinic.  See above.  I will defer.  He agrees.  His abdominal symptoms appear to be self-limited and he will update me as needed.

## 2020-08-13 NOTE — Assessment & Plan Note (Signed)
I do not want to change his medications at this point.  His systolic is slightly elevated but his diastolic is reasonable and I do not want to induce hypotension.  He can check his blood pressure out of the clinic.  I did ask him to verify his current dose of amlodipine.  See after visit summary.

## 2020-08-13 NOTE — Assessment & Plan Note (Signed)
Continue pravastatin.  Future lipid panel ordered.  My hope is this can be collected at the oncology clinic at the same time he has his other labs drawn.

## 2020-08-17 ENCOUNTER — Inpatient Hospital Stay: Payer: BC Managed Care – PPO | Attending: Oncology

## 2020-08-17 ENCOUNTER — Other Ambulatory Visit: Payer: Self-pay

## 2020-08-17 ENCOUNTER — Inpatient Hospital Stay (HOSPITAL_BASED_OUTPATIENT_CLINIC_OR_DEPARTMENT_OTHER): Payer: BC Managed Care – PPO | Admitting: Oncology

## 2020-08-17 ENCOUNTER — Encounter: Payer: Self-pay | Admitting: Oncology

## 2020-08-17 VITALS — BP 129/81 | HR 63 | Temp 97.0°F | Resp 18 | Wt 204.2 lb

## 2020-08-17 DIAGNOSIS — D696 Thrombocytopenia, unspecified: Secondary | ICD-10-CM | POA: Diagnosis not present

## 2020-08-17 DIAGNOSIS — M8580 Other specified disorders of bone density and structure, unspecified site: Secondary | ICD-10-CM | POA: Diagnosis not present

## 2020-08-17 DIAGNOSIS — N1831 Chronic kidney disease, stage 3a: Secondary | ICD-10-CM | POA: Diagnosis not present

## 2020-08-17 DIAGNOSIS — Z79899 Other long term (current) drug therapy: Secondary | ICD-10-CM | POA: Diagnosis not present

## 2020-08-17 DIAGNOSIS — N183 Chronic kidney disease, stage 3 unspecified: Secondary | ICD-10-CM | POA: Insufficient documentation

## 2020-08-17 DIAGNOSIS — G8929 Other chronic pain: Secondary | ICD-10-CM | POA: Diagnosis not present

## 2020-08-17 DIAGNOSIS — I129 Hypertensive chronic kidney disease with stage 1 through stage 4 chronic kidney disease, or unspecified chronic kidney disease: Secondary | ICD-10-CM | POA: Diagnosis not present

## 2020-08-17 DIAGNOSIS — E785 Hyperlipidemia, unspecified: Secondary | ICD-10-CM | POA: Diagnosis not present

## 2020-08-17 DIAGNOSIS — C9001 Multiple myeloma in remission: Secondary | ICD-10-CM | POA: Insufficient documentation

## 2020-08-17 DIAGNOSIS — Z9484 Stem cells transplant status: Secondary | ICD-10-CM | POA: Insufficient documentation

## 2020-08-17 DIAGNOSIS — M549 Dorsalgia, unspecified: Secondary | ICD-10-CM | POA: Insufficient documentation

## 2020-08-17 DIAGNOSIS — C9000 Multiple myeloma not having achieved remission: Secondary | ICD-10-CM

## 2020-08-17 DIAGNOSIS — M542 Cervicalgia: Secondary | ICD-10-CM | POA: Insufficient documentation

## 2020-08-17 DIAGNOSIS — Z5111 Encounter for antineoplastic chemotherapy: Secondary | ICD-10-CM

## 2020-08-17 LAB — COMPREHENSIVE METABOLIC PANEL
ALT: 21 U/L (ref 0–44)
AST: 22 U/L (ref 15–41)
Albumin: 4.5 g/dL (ref 3.5–5.0)
Alkaline Phosphatase: 70 U/L (ref 38–126)
Anion gap: 9 (ref 5–15)
BUN: 23 mg/dL (ref 8–23)
CO2: 28 mmol/L (ref 22–32)
Calcium: 9.7 mg/dL (ref 8.9–10.3)
Chloride: 104 mmol/L (ref 98–111)
Creatinine, Ser: 1.42 mg/dL — ABNORMAL HIGH (ref 0.61–1.24)
GFR, Estimated: 55 mL/min — ABNORMAL LOW (ref >60)
Glucose, Bld: 92 mg/dL (ref 70–99)
Potassium: 4.1 mmol/L (ref 3.5–5.1)
Sodium: 141 mmol/L (ref 135–145)
Total Bilirubin: 0.6 mg/dL (ref 0.3–1.2)
Total Protein: 7.8 g/dL (ref 6.5–8.1)

## 2020-08-17 LAB — CBC WITH DIFFERENTIAL/PLATELET
Abs Immature Granulocytes: 0.01 10*3/uL (ref 0.00–0.07)
Basophils Absolute: 0 10*3/uL (ref 0.0–0.1)
Basophils Relative: 0 %
Eosinophils Absolute: 0 10*3/uL (ref 0.0–0.5)
Eosinophils Relative: 1 %
HCT: 36.7 % — ABNORMAL LOW (ref 39.0–52.0)
Hemoglobin: 12.6 g/dL — ABNORMAL LOW (ref 13.0–17.0)
Immature Granulocytes: 0 %
Lymphocytes Relative: 31 %
Lymphs Abs: 1.3 10*3/uL (ref 0.7–4.0)
MCH: 34.5 pg — ABNORMAL HIGH (ref 26.0–34.0)
MCHC: 34.3 g/dL (ref 30.0–36.0)
MCV: 100.5 fL — ABNORMAL HIGH (ref 80.0–100.0)
Monocytes Absolute: 0.4 10*3/uL (ref 0.1–1.0)
Monocytes Relative: 10 %
Neutro Abs: 2.4 10*3/uL (ref 1.7–7.7)
Neutrophils Relative %: 58 %
Platelets: 78 10*3/uL — ABNORMAL LOW (ref 150–400)
RBC: 3.65 MIL/uL — ABNORMAL LOW (ref 4.22–5.81)
RDW: 13.3 % (ref 11.5–15.5)
WBC: 4.1 10*3/uL (ref 4.0–10.5)
nRBC: 0 % (ref 0.0–0.2)

## 2020-08-17 NOTE — Progress Notes (Signed)
Advised by Dr. Damita Dunnings to stop taking Acyclovir.  Patient had a recent MRI at [facility].

## 2020-08-17 NOTE — Progress Notes (Signed)
Hematology/Oncology  Follow up note [facility] [phone] Fax:([phone]) [phone]   Patient Care Team: Tonia Ghent, MD as PCP - General (Family Medicine) Earlie Server, MD as Consulting Physician (Oncology) Beather Arbour Lilyan Punt, MD as Referring Physician (Hematology and Oncology) Diannia Ruder, MD as Referring Physician (Hematology and Oncology)  REASON FOR VISIT Follow up for multiple myeloma.   HISTORY OF PRESENTING ILLNESS:  Luis Burke is a  64 y.o.  male with PMH listed below presents for follow up of multiple myeloma.  # 07/27/2018 multiple myeloma panel showed M protein of 0.1, IgG 662, IgA 49, IgM 7. Patient was called back to further labs done. 08/10/2018, free light chain ratio showed extremely high level of kappa free light chain 10,183, with a kappa lambda light chain ratio of 1414.31 LDH 164 Beta-2 microglobulin 5 Patient was called and discuss about results.  He was recommended to undergo bone marrow biopsy and PET scan. 08/19/2018 bone marrow biopsy showed hypercellular marrow 80%, involved by plasma cell neoplasm up to 95%.  Consistent with plasma cell myeloma. Myeloma FISH trisomy 12  09/10/2018 skeletal survey showed questionable small lucent lesions within the midshaft of the humerus bilaterally.  Otherwise no suspicious focal lytic lesion or acute bone abnormality. 08/25/2018 PET scan showed no definite hypermetabolic bone disease but CT findings are highly suspicious for numerous small myelomatous lesions involving the spine, sternum and scattered ribs.  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 He received preparative regimen with melphalan 200 mg/m on 04/22/2019 followed by autologous stem cell infusion on 04/23/2019. Currently he is transfusion independent.  Transfusion criteria with as needed hemoglobin less than 7.5 for platelet less than 10,000.  Transplant course was complicated with febrile  neutropenia grade 3, treated with vancomycin and cephapirin until engraftment on 05/06/2019. Patient also had engraftment syndrome and had to be started on Solu-Medrol 25 mg twice daily on 05/05/2019.  Transition to Medrol Dosepak on 05/07/2019 to complete steroid taper as outpatient.  Patient is currently on acyclovir for 1 year after transplant, dose was reduced on 722 11/20/2018 due to renal function. Patient had a baseline CKD with creatinine running between 1.4-1.6.  He had acute on chronic kidney injury and creatinine went up to 2.2 on 722.  Fluid hydration was given and creatinine decreased to baseline 1.5-1.7. His Hickman catheter was removed.  Candida Cruris treated with topical clotrimazole 1% 3 times daily and to resolution..  #  seen by Dr. Tonia Brooms BMT team on 06/30/2019.revaccination at [place] begins at 12 months post transplant I discussed with [facility] hematology Dr.Choi and he recommends plans as listed below.  -patient to be started on Ixazomib maintenance: -Ixazomib 55m on D1/D8/D15 out of 28 day cycle.  -patient will start re-vaccination 12 months post transplant.  -Post transplant bone marrow biopsy if clinically indicated.  # seen by [facility] bone marrow transplant team in June 2021.  Patient has been started on immunization protocol.  He reports no new complaints.  #07/12/2020 [facility] Lucent lesion in the L4 spinous process measuring 4 mm which is  nonspecific. Consider confirmation of marrow replacing process with MRI.  2. Incompletely characterized renal cysts   INTERVAL HISTORY Luis IQBALis a 64y.o. 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 #Patient is currently on ixizomab 3 mg on day 1, day 8, day 15 out of 28  days. Patient is starting next cycle of treatment today. No new complaints. Patient has had a bone marrow biopsy done. He continues to have chronic back pain and neck  pain.  Had MRI spine at the [place] recently 08/05/2020, MRI lumbar spine with and without contrast showed no evidence of spinal metastasis.  Lumbar spondylosis is most pronounced at L5-S1 where there is severe right and moderate left neural foraminal stenosis. 08/11/2020, x-ray cervical spine complete with flexion and extension showed The cervical spine is visualized from C1 to the top of T1 on the lateral  view.  Reversal of the normal cervical lordosis with a focal kyphosis centered at the C5-C6 intervertebral level. Trace anterolisthesis of C4 onto C5. Trace retrolisthesis of C6 on C7. No evidence ofdynamic instability is noted on  the flexion or extension views.   No prevertebral soft tissue swelling. Cervical vertebral body heights are maintained. Multilevel degenerative changes of the cervical spine including discogenic disease, most pronounced at C5-C6 and C6-C7, and facet arthropathy most pronounced at C4-C5. Multilevel mild to moderate left neural foraminal osseous narrowing of the mid to lower cervical vertebrae, possibly centrally/artifactual by patient positioning.    Review of Systems  Constitutional: Negative for appetite change, chills, fatigue, fever and unexpected weight change.  HENT:   Negative for hearing loss and voice change.   Eyes: Negative for eye problems and icterus.  Respiratory: Negative for chest tightness, cough and shortness of breath.   Cardiovascular: Negative for chest pain and leg swelling.  Gastrointestinal: Negative for abdominal distention and abdominal pain.  Endocrine: Negative for hot flashes.  Genitourinary: Negative for difficulty urinating, dysuria and frequency.   Musculoskeletal: Positive for back pain and neck pain. Negative for arthralgias.  Skin: Negative for itching and rash.  Neurological: Negative for light-headedness and numbness.  Hematological: Negative for adenopathy. Does not bruise/bleed easily.  Psychiatric/Behavioral: Negative for  confusion.    MEDICAL HISTORY:  Past Medical History:  Diagnosis Date  . AKI (acute kidney injury) ([place]) 01/06/2019  . Anemia   . 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[place])    autologous stem cell bone marrow transplant on 04/23/2019.  Marland Kitchen Fatty liver    on u/s 07/2018  . Hyperlipidemia 03/2004  . Hypertension 1994  . Multiple myeloma ([place])   . Snores     SURGICAL HISTORY: Past Surgical History:  Procedure Laterality Date  . CATARACT EXTRACTION W/PHACO Right 03/04/2018   Procedure: CATARACT EXTRACTION PHACO AND INTRAOCULAR LENS PLACEMENT ([place])  RIGHT TORIC;  Surgeon: Leandrew Koyanagi, MD;  Location: [place];  Service: Ophthalmology;  Laterality: Right;  Per [place] no Toric Lens 1:45 5.3  . COLONOSCOPY WITH PROPOFOL N/A 07/20/2018   Procedure: COLONOSCOPY WITH PROPOFOL;  Surgeon: Jonathon Bellows, MD;  [facility] END[facility]COPY;  Service: Gastroenterology;  Laterality: N/A;  . ESOPHAGOGASTRODUODENOSCOPY (EGD) WITH PROPOFOL N/A 07/20/2018   Procedure: ESOPHAGOGASTRODUODENOSCOPY (EGD) WITH PROPOFOL;  Surgeon: Jonathon Bellows, MD;  Location: [facility] ENDOSCOPY;  Service: Gastroenterology;  Laterality: N/A;  . EYE SURGERY Right   . HERNIA REPAIR    . L Lap herniorraphy  04/2000  . Left wrist ganglionectomy      SOCIAL HISTORY: Social History   Socioeconomic History  . Marital status: Single    Spouse name: Not on file  . Number of children: 1  . Years of education: Not on file  . Highest education level: Not on file  Occupational History  . Occupation: [employer]  Tobacco Use  . Smoking status: Never Smoker  .  Smokeless tobacco: Never Used  . Tobacco comment: Occassionally  Vaping Use  . Vaping Use: Never used  Substance and Sexual Activity  . Alcohol use: Not Currently    Alcohol/week: 3.0 standard drinks    Types: 3 Cans of beer per week  . Drug use: No  . Sexual activity: Not on file  Other Topics Concern  . Not on file  Social History Narrative   Divorced 12/16/2007, dating as  of 12/15/2017   His daughter died 4 days after giving birth to the patient's granddaughter   Has joint custody of his dead daughter's child   Works at [employer] is [employer]:   . Difficulty of Paying Living Expenses: Not on file  Food Insecurity:   . Worried About Charity fundraiser in the Last Year: Not on file  . Ran Out of Food in the Last Year: Not on file  Transportation Needs:   . Lack of Transportation (Medical): Not on file  . Lack of Transportation (Non-Medical): Not on file  Physical Activity:   . Days of Exercise per Week: Not on file  . Minutes of Exercise per Session: Not on file  Stress:   . Feeling of Stress : Not on file  Social Connections:   . Frequency of Communication with Friends and Family: Not on file  . Frequency of Social Gatherings with Friends and Family: Not on file  . Attends Religious Services: Not on file  . Active Member of Clubs or Organizations: Not on file  . Attends Archivist Meetings: Not on file  . Marital Status: Not on file  Intimate Partner Violence:   . Fear of Current or Ex-Partner: Not on file  . Emotionally Abused: Not on file  . Physically Abused: Not on file  . Sexually Abused: Not on file    FAMILY HISTORY: Family History  Problem Relation Age of Onset  . Hypertension Mother   . Diabetes Mother   . Heart disease Mother        CAD  . Dementia Mother   . Hypertension Father   . Heart disease Father        MI 02/03  . Colon cancer Father   . Hypertension Sister   . Hypertension Sister   . Hypertension Sister   . Prostate cancer Neg Hx     ALLERGIES:  is allergic to bactrim [sulfamethoxazole-trimethoprim], clonidine derivatives, and tadalafil.  MEDICATIONS:  Current Outpatient Medications  Medication Sig Dispense Refill  . acetaminophen (TYLENOL) 325 MG tablet Take 650 mg by mouth every 6 (six) hours as needed.    Marland Kitchen amLODipine (NORVASC) 5 MG  tablet TAKE 1-2 TABLETS (5-10 MG TOTAL) BY MOUTH DAILY. 180 tablet 3  . B Complex Vitamins (VITAMIN B COMPLEX PO) SMARTSIG:By Mouth    . clopidogrel (PLAVIX) 75 MG tablet Take by mouth.    . co-enzyme Q-10 50 MG capsule Take 50 mg by mouth daily.    . cyanocobalamin ([employer] VITAMIN B12) 1000 MCG tablet Take 1 tablet (1,000 mcg total) by mouth daily. 90 tablet 3  . cyclobenzaprine (FLEXERIL) 5 MG tablet Take 1 tablet (5 mg total) by mouth 3 (three) times daily as needed for muscle spasms. 30 tablet 0  . famotidine (PEPCID) 20 MG tablet Take 20 mg by mouth daily.     . hydrALAZINE (APRESOLINE) 10 MG tablet Take 1 tablet (10 mg total) by mouth 3 (three) times  daily. Please make an appointment for physical exam prior to further refills. 270 tablet 0  . ixazomib citrate (NINLARO) 3 MG capsule Take 1 capsule (3 mg) by mouth weekly, 3 weeks on, 1 week off, repeat every 4 weeks. Take on an empty stomach 1hr before or 2hr after meals. 3 capsule 3  . lisinopril (ZESTRIL) 20 MG tablet Take 1 tablet (20 mg total) by mouth daily. Please make an appointment for physical exam prior to further refills. 90 tablet 0  . LORazepam (ATIVAN) 1 MG tablet TAKE 1 TABLET (1 MG TOTAL) BY MOUTH EVERY 6 (SIX) HOURS AS NEEDED FOR ANXIETY (FOR NAUSEA)    . magnesium chloride (SLOW-MAG) 64 MG TBEC SR tablet Take 1 tablet (64 mg total) by mouth daily. 60 tablet 0  . metoprolol succinate (TOPROL-XL) 50 MG 24 hr tablet TAKE 4 TABLETS (200 MG TOTAL) BY MOUTH DAILY. TAKE WITH OR IMMEDIATELY FOLLOWING A MEAL. 360 tablet 0  . ondansetron (ZOFRAN) 8 MG tablet Take 8 mg by mouth every 8 (eight) hours as needed. for nausea    . pravastatin (PRAVACHOL) 10 MG tablet Take 1 tablet (10 mg total) by mouth daily.    . prochlorperazine (COMPAZINE) 10 MG tablet TAKE 1 TABLET (10 MG TOTAL) BY MOUTH EVERY 6 (SIX) HOURS AS NEEDED FOR NAUSEA    . zinc gluconate 50 MG tablet Take 50 mg by mouth daily.     No current facility-administered medications for  this visit.     PHYSICAL EXAMINATION: ECOG PERFORMANCE STATUS: 1 - Symptomatic but completely ambulatory Vitals:   08/17/20 1128  BP: 129/81  Pulse: 63  Resp: 18  Temp: (!) 97 F (36.1 C)   Filed Weights   08/17/20 1128  Weight: 204 lb 3.2 oz (92.6 kg)    Physical Exam Constitutional:      General: He is not in acute distress. HENT:     Head: Normocephalic and atraumatic.  Eyes:     General: No scleral icterus. Cardiovascular:     Rate and Rhythm: Normal rate and regular rhythm.     Heart sounds: Normal heart sounds.  Pulmonary:     Effort: Pulmonary effort is normal. No respiratory distress.     Breath sounds: No wheezing.  Abdominal:     General: Bowel sounds are normal. There is no distension.     Palpations: Abdomen is soft.  Musculoskeletal:        General: No deformity. Normal range of motion.     Cervical back: Normal range of motion and neck supple.  Skin:    General: Skin is warm and dry.     Findings: No erythema or rash.  Neurological:     Mental Status: He is alert and oriented to person, place, and time. Mental status is at baseline.     Cranial Nerves: No cranial nerve deficit.     Coordination: Coordination normal.  Psychiatric:        Mood and Affect: Mood normal.      LABORATORY DATA:  I have reviewed the data as listed Lab Results  Component Value Date   WBC 4.1 08/17/2020   HGB 12.6 (L) 08/17/2020   HCT 36.7 (L) 08/17/2020   MCV 100.5 (H) 08/17/2020   PLT 78 (L) 08/17/2020   Recent Labs    04/27/20 1016 04/27/20 1016 05/30/20 0944 05/30/20 0944 06/22/20 0847 07/20/20 0844 08/17/20 1101  NA 138   < > 138   < > 139 140 141  K  4.0   < > 4.4   < > 4.5 4.1 4.1  CL 103   < > 104   < > 103 108 104  CO2 26   < > 26   < > 26 23 28   GLUCOSE 103*   < > 102*   < > 104* 110* 92  BUN 23   < > 20   < > 24* 24* 23  CREATININE 1.41*   < > 1.29*   < > 1.55* 1.35* 1.42*  CALCIUM 9.6   < > 9.2   < > 9.8 9.0 9.7  GFRNONAA 52*   < > 58*   < >  47* 55* 55*  GFRAA >60  --  >60  --  54*  --   --   PROT 7.8   < > 7.4   < > 7.6 6.9 7.8  ALBUMIN 4.7   < > 4.4   < > 4.3 4.0 4.5  AST 24   < > 23   < > 24 20 22   ALT 24   < > 24   < > 24 22 21   ALKPHOS 62   < > 66   < > 59 53 70  BILITOT 0.9   < > 0.8   < > 0.8 0.9 0.6   < > = values in this interval not displayed.   Iron/TIBC/Ferritin/ %Sat    Component Value Date/Time   IRON 74 07/01/2018 0944   TIBC 250 07/01/2018 0944   FERRITIN 357 07/01/2018 0944   IRONPCTSAT 30 07/01/2018 0944    Lab Results  Component Value Date   TOTALPROTELP 6.2 07/20/2020   ALBUMINELP 3.7 03/10/2019   A1GS 0.3 03/10/2019   A2GS 0.8 03/10/2019   BETS 1.3 03/10/2019   GAMS 0.6 03/10/2019   MSPIKE Not Observed 03/10/2019   SPEI Comment 03/10/2019   Lab Results  Component Value Date   KPAFRELGTCHN 41.2 (H) 07/20/2020   LAMBDASER 14.3 07/20/2020   KAPLAMBRATIO 2.88 (H) 07/20/2020    RADIOGRAPHIC STUDIES: I have personally reviewed the radiological images as listed and agreed with the findings in the report. 10/23/2018 FINDINGS: The heart size and mediastinal contours are within normal limits. Both lungs are clear. The visualized skeletal structures are unremarkable. IMPRESSION: No active cardiopulmonary disease.   ASSESSMENT & PLAN:  1. Multiple myeloma in remission ([place])   2. Thrombocytopenia ([place])   3. Encounter for antineoplastic chemotherapy   4. Stage 3a chronic kidney disease (H[facility])    #Light chain multiple myeloma beta 2 microglobulin 5 and normal albumin.  Stage II. cytogenetics showed normal male chromosome, MDS FISH panel showed trisomy 75- Standard Risk. S/p RVD x 9 and  Aut[facility] bone marrow stem [facility] on 04/23/2019.  07/28/2020, bone marrow biopsy showed normocellular marrow with polytypic plasmacytosis, 3 to 8% results were discussed with patient.  Clinically he is in remission. Cytogenetics is negative.  I called pathology Dr. Melina Copa and asked her to  send myeloma FISH testing Labs are reviewed and discussed with patient. Continue next cycle of ixizomab  # Lumbar lesion,  [facility] MRI results showed no metastatic spine lesion. Chronic back pain and neck pain, images are consistent with chronic degenerative disease.  Thrombocytopenia,  count is stable at his baseline .   chronic kidney disease, creatinine is stable. Encourage oral hydration.  Post bone marrow transplant care, He has completed 1 year of acyclovir.  Off acyclovir now. Post transplant vaccination at [facility].   #  Bone health/osteopenia/multiple myeloma-  Continue Xgeva monthly for 2 years, Xgeva next week.  Continue vitamin D and calcium supplementation.   Follow up 4 weeks for evaluation prior to starting next cycle of chemotherapy for maintenance.  Earlie Server, MD, PhD Hematology Oncology [place] at [facility] 08/17/2020

## 2020-08-18 LAB — KAPPA/LAMBDA LIGHT CHAINS
Kappa free light chain: 44.2 mg/L — ABNORMAL HIGH (ref 3.3–19.4)
Kappa, lambda light chain ratio: 2.78 — ABNORMAL HIGH (ref 0.26–1.65)
Lambda free light chains: 15.9 mg/L (ref 5.7–26.3)

## 2020-08-19 ENCOUNTER — Other Ambulatory Visit: Payer: Self-pay | Admitting: Family Medicine

## 2020-08-21 LAB — MULTIPLE MYELOMA PANEL, SERUM
Albumin SerPl Elph-Mcnc: 4.3 g/dL (ref 2.9–4.4)
Albumin/Glob SerPl: 1.5 (ref 0.7–1.7)
Alpha 1: 0.2 g/dL (ref 0.0–0.4)
Alpha2 Glob SerPl Elph-Mcnc: 0.7 g/dL (ref 0.4–1.0)
B-Globulin SerPl Elph-Mcnc: 1 g/dL (ref 0.7–1.3)
Gamma Glob SerPl Elph-Mcnc: 1.1 g/dL (ref 0.4–1.8)
Globulin, Total: 3 g/dL (ref 2.2–3.9)
IgA: 180 mg/dL (ref 61–437)
IgG (Immunoglobin G), Serum: 1124 mg/dL (ref 603–1613)
IgM (Immunoglobulin M), Srm: 32 mg/dL (ref 20–172)
Total Protein ELP: 7.3 g/dL (ref 6.0–8.5)

## 2020-08-21 NOTE — Telephone Encounter (Signed)
Pharmacy requests refill on: Hydralazine 10 mg  LAST REFILL: 05/22/2020 LAST OV: 08/10/2020 NEXT OV: Not Scheduled  PHARMACY: [facility] [id number]

## 2020-08-22 ENCOUNTER — Inpatient Hospital Stay: Payer: BC Managed Care – PPO

## 2020-08-22 DIAGNOSIS — Z79899 Other long term (current) drug therapy: Secondary | ICD-10-CM | POA: Diagnosis not present

## 2020-08-22 DIAGNOSIS — G8929 Other chronic pain: Secondary | ICD-10-CM | POA: Diagnosis not present

## 2020-08-22 DIAGNOSIS — D696 Thrombocytopenia, unspecified: Secondary | ICD-10-CM | POA: Diagnosis not present

## 2020-08-22 DIAGNOSIS — I129 Hypertensive chronic kidney disease with stage 1 through stage 4 chronic kidney disease, or unspecified chronic kidney disease: Secondary | ICD-10-CM | POA: Diagnosis not present

## 2020-08-22 DIAGNOSIS — M8580 Other specified disorders of bone density and structure, unspecified site: Secondary | ICD-10-CM | POA: Diagnosis not present

## 2020-08-22 DIAGNOSIS — Z9484 Stem cells transplant status: Secondary | ICD-10-CM | POA: Diagnosis not present

## 2020-08-22 DIAGNOSIS — N183 Chronic kidney disease, stage 3 unspecified: Secondary | ICD-10-CM | POA: Diagnosis not present

## 2020-08-22 DIAGNOSIS — N179 Acute kidney failure, unspecified: Secondary | ICD-10-CM

## 2020-08-22 DIAGNOSIS — C9001 Multiple myeloma in remission: Secondary | ICD-10-CM | POA: Diagnosis not present

## 2020-08-22 DIAGNOSIS — E785 Hyperlipidemia, unspecified: Secondary | ICD-10-CM | POA: Diagnosis not present

## 2020-08-22 DIAGNOSIS — M542 Cervicalgia: Secondary | ICD-10-CM | POA: Diagnosis not present

## 2020-08-22 DIAGNOSIS — M549 Dorsalgia, unspecified: Secondary | ICD-10-CM | POA: Diagnosis not present

## 2020-08-22 MED ORDER — DENOSUMAB 120 MG/1.7ML ~~LOC~~ SOLN
120.0000 mg | Freq: Once | SUBCUTANEOUS | Status: AC
  Administered 2020-08-22: 120 mg via SUBCUTANEOUS
  Filled 2020-08-22: qty 1.7

## 2020-08-24 ENCOUNTER — Encounter (HOSPITAL_COMMUNITY): Payer: Self-pay | Admitting: Oncology

## 2020-09-01 ENCOUNTER — Ambulatory Visit: Payer: BC Managed Care – PPO

## 2020-09-01 LAB — SURGICAL PATHOLOGY

## 2020-09-06 ENCOUNTER — Other Ambulatory Visit: Payer: Self-pay | Admitting: Family Medicine

## 2020-09-14 ENCOUNTER — Inpatient Hospital Stay (HOSPITAL_BASED_OUTPATIENT_CLINIC_OR_DEPARTMENT_OTHER): Payer: BC Managed Care – PPO | Admitting: Oncology

## 2020-09-14 ENCOUNTER — Encounter: Payer: Self-pay | Admitting: Oncology

## 2020-09-14 ENCOUNTER — Inpatient Hospital Stay: Payer: BC Managed Care – PPO | Attending: Oncology

## 2020-09-14 VITALS — BP 142/84 | HR 63 | Temp 97.3°F | Resp 16 | Wt 207.5 lb

## 2020-09-14 DIAGNOSIS — K760 Fatty (change of) liver, not elsewhere classified: Secondary | ICD-10-CM | POA: Diagnosis not present

## 2020-09-14 DIAGNOSIS — C9000 Multiple myeloma not having achieved remission: Secondary | ICD-10-CM | POA: Diagnosis not present

## 2020-09-14 DIAGNOSIS — N183 Chronic kidney disease, stage 3 unspecified: Secondary | ICD-10-CM | POA: Diagnosis not present

## 2020-09-14 DIAGNOSIS — Z5111 Encounter for antineoplastic chemotherapy: Secondary | ICD-10-CM

## 2020-09-14 DIAGNOSIS — Z79899 Other long term (current) drug therapy: Secondary | ICD-10-CM | POA: Diagnosis not present

## 2020-09-14 DIAGNOSIS — C9001 Multiple myeloma in remission: Secondary | ICD-10-CM

## 2020-09-14 DIAGNOSIS — M542 Cervicalgia: Secondary | ICD-10-CM | POA: Diagnosis not present

## 2020-09-14 DIAGNOSIS — Z9484 Stem cells transplant status: Secondary | ICD-10-CM | POA: Diagnosis not present

## 2020-09-14 DIAGNOSIS — N1831 Chronic kidney disease, stage 3a: Secondary | ICD-10-CM | POA: Diagnosis not present

## 2020-09-14 DIAGNOSIS — Z7902 Long term (current) use of antithrombotics/antiplatelets: Secondary | ICD-10-CM | POA: Diagnosis not present

## 2020-09-14 DIAGNOSIS — E785 Hyperlipidemia, unspecified: Secondary | ICD-10-CM | POA: Insufficient documentation

## 2020-09-14 DIAGNOSIS — D696 Thrombocytopenia, unspecified: Secondary | ICD-10-CM | POA: Insufficient documentation

## 2020-09-14 DIAGNOSIS — I129 Hypertensive chronic kidney disease with stage 1 through stage 4 chronic kidney disease, or unspecified chronic kidney disease: Secondary | ICD-10-CM | POA: Diagnosis not present

## 2020-09-14 LAB — CBC WITH DIFFERENTIAL/PLATELET
Abs Immature Granulocytes: 0.01 10*3/uL (ref 0.00–0.07)
Basophils Absolute: 0 10*3/uL (ref 0.0–0.1)
Basophils Relative: 0 %
Eosinophils Absolute: 0.1 10*3/uL (ref 0.0–0.5)
Eosinophils Relative: 1 %
HCT: 37.4 % — ABNORMAL LOW (ref 39.0–52.0)
Hemoglobin: 12.8 g/dL — ABNORMAL LOW (ref 13.0–17.0)
Immature Granulocytes: 0 %
Lymphocytes Relative: 33 %
Lymphs Abs: 1.3 10*3/uL (ref 0.7–4.0)
MCH: 33.8 pg (ref 26.0–34.0)
MCHC: 34.2 g/dL (ref 30.0–36.0)
MCV: 98.7 fL (ref 80.0–100.0)
Monocytes Absolute: 0.3 10*3/uL (ref 0.1–1.0)
Monocytes Relative: 8 %
Neutro Abs: 2.2 10*3/uL (ref 1.7–7.7)
Neutrophils Relative %: 58 %
Platelets: 70 10*3/uL — ABNORMAL LOW (ref 150–400)
RBC: 3.79 MIL/uL — ABNORMAL LOW (ref 4.22–5.81)
RDW: 13.1 % (ref 11.5–15.5)
WBC: 3.8 10*3/uL — ABNORMAL LOW (ref 4.0–10.5)
nRBC: 0 % (ref 0.0–0.2)

## 2020-09-14 LAB — COMPREHENSIVE METABOLIC PANEL
ALT: 24 U/L (ref 0–44)
AST: 21 U/L (ref 15–41)
Albumin: 4.6 g/dL (ref 3.5–5.0)
Alkaline Phosphatase: 59 U/L (ref 38–126)
Anion gap: 11 (ref 5–15)
BUN: 22 mg/dL (ref 8–23)
CO2: 27 mmol/L (ref 22–32)
Calcium: 9.6 mg/dL (ref 8.9–10.3)
Chloride: 101 mmol/L (ref 98–111)
Creatinine, Ser: 1.38 mg/dL — ABNORMAL HIGH (ref 0.61–1.24)
GFR, Estimated: 57 mL/min — ABNORMAL LOW (ref >60)
Glucose, Bld: 95 mg/dL (ref 70–99)
Potassium: 3.9 mmol/L (ref 3.5–5.1)
Sodium: 139 mmol/L (ref 135–145)
Total Bilirubin: 0.9 mg/dL (ref 0.3–1.2)
Total Protein: 7.4 g/dL (ref 6.5–8.1)

## 2020-09-14 NOTE — Progress Notes (Signed)
Hematology/Oncology  Follow up note [facility] [phone] [phone]   Patient Care Team: Tonia Ghent, MD as PCP - General (Family Medicine) Earlie Server, MD as Consulting Physician (Oncology) Beather Arbour Lilyan Punt, MD as Referring Physician (Hematology and Oncology) Diannia Ruder, MD as Referring Physician (Hematology and Oncology)  REASON FOR VISIT Follow up for multiple myeloma.   HISTORY OF PRESENTING ILLNESS:  Luis Burke is a  64 y.o.  male with PMH listed below presents for follow up of multiple myeloma.  # 07/27/2018 multiple myeloma panel showed M protein of 0.1, IgG 662, IgA 49, IgM 7. Patient was called back to further labs done. 08/10/2018, free light chain ratio showed extremely high level of kappa free light chain 10,183, with a kappa lambda light chain ratio of 1414.31 LDH 164 Beta-2 microglobulin 5 Patient was called and discuss about results.  He was recommended to undergo bone marrow biopsy and PET scan. 08/19/2018 bone marrow biopsy showed hypercellular marrow 80%, involved by plasma cell neoplasm up to 95%.  Consistent with plasma cell myeloma. Myeloma FISH trisomy 12  09/10/2018 skeletal survey showed questionable small lucent lesions within the midshaft of the humerus bilaterally.  Otherwise no suspicious focal lytic lesion or acute bone abnormality. 08/25/2018 PET scan showed no definite hypermetabolic bone disease but CT findings are highly suspicious for numerous small myelomatous lesions involving the spine, sternum and scattered ribs.  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 He received preparative regimen with melphalan 200 mg/m on 04/22/2019 followed by autologous stem cell infusion on 04/23/2019. Currently he is transfusion independent.  Transfusion criteria with as needed hemoglobin less than 7.5 for platelet less than 10,000.  Transplant course was complicated with febrile  neutropenia grade 3, treated with vancomycin and cephapirin until engraftment on 05/06/2019. Patient also had engraftment syndrome and had to be started on Solu-Medrol 25 mg twice daily on 05/05/2019.  Transition to Medrol Dosepak on 05/07/2019 to complete steroid taper as outpatient.  Patient is currently on acyclovir for 1 year after transplant, dose was reduced on 722 11/20/2018 due to renal function. Patient had a baseline CKD with creatinine running between 1.4-1.6.  He had acute on chronic kidney injury and creatinine went up to 2.2 on 722.  Fluid hydration was given and creatinine decreased to baseline 1.5-1.7. His Hickman catheter was removed.  Candida Cruris treated with topical clotrimazole 1% 3 times daily and to resolution..  #  seen by Dr. Tonia Brooms BMT team on 06/30/2019.revaccination at [place] begins at 12 months post transplant I discussed with [facility] hematology Dr.Choi and he recommends plans as listed below.  -patient to be started on Ixazomib maintenance: -Ixazomib 64m on D1/D8/D15 out of 28 day cycle.  -patient will start re-vaccination 12 months post transplant.  -Post transplant bone marrow biopsy if clinically indicated.  # seen by [facility] bone marrow transplant team in June 2021.  Patient has been started on immunization protocol.  He reports no new complaints.  #[facility]12/2020 [facility] Lucent lesion in the L4 spinous process measuring 4 mm which is  nonspecific. Consider confirmation of marrow replacing process with MRI.  2. Incompletely characterized renal cysts   # He continues to have chronic back pain and neck pain.  [facility], MRI lumbar spine with and without contrast showed no evidence of spinal metastasis.  Lumbar spondylosis is most pronounced at L5-S1 where there is severe  right and moderate left neural foraminal stenosis. 08/11/2020, x-ray cervical spine complete with flexion and extension showed The cervical spine is  visualized from C1 to the top of T1 on the lateral  view.  Reversal of the normal cervical lordosis with a focal kyphosis centered at the C5-C6 intervertebral level. Trace anterolisthesis of C4 onto C5. Trace retrolisthesis of C6 on C7. No evidence ofdynamic instability is noted on  the flexion or extension views.   No prevertebral soft tissue swelling. Cervical vertebral body heights are maintained. Multilevel degenerative changes of the cervical spine including discogenic disease, most pronounced at C5-C6 and C6-C7, and facet arthropathy most pronounced at C4-C5. Multilevel mild to moderate left neural foraminal osseous narrowing of the mid to lower cervical vertebrae, possibly centrally/artifactual by patient positioning.  INTERVAL HISTORY Luis Burke is a 64 y.o. 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 #Patient is currently on ixizomab 3 mg on day 1, day 8, day 15 out of 28 days. Patient is starting next cycle of treatment today. Patient has no new complaints.  07/28/2020, bone marrow biopsy showed normocellular marrow with polytypic plasmacytosis, 3 to 8% results were discussed with patient.  Clinically he is in remission. Cytogenetics is negative, no trisomy 12.  Myeloma FISH panel is negative.     Review of Systems  Constitutional: Negative for appetite change, chills, fatigue, fever and unexpected weight change.  HENT:   Negative for hearing loss and voice change.   Eyes: Negative for eye problems and icterus.  Respiratory: Negative for chest tightness, cough and shortness of breath.   Cardiovascular: Negative for chest pain and leg swelling.  Gastrointestinal: Negative for abdominal distention and abdominal pain.  Endocrine: Negative for hot flashes.  Genitourinary: Negative for difficulty urinating, dysuria and frequency.   Musculoskeletal: Positive for back pain and neck pain. Negative for arthralgias.   Skin: Negative for itching and rash.  Neurological: Negative for light-headedness and numbness.  Hematological: Negative for adenopathy. Does not bruise/bleed easily.  Psychiatric/Behavioral: Negative for confusion.    MEDICAL HISTORY:  Past Medical History:  Diagnosis Date  . AKI (acute kidney injury) ([place]) 01/06/2019  . Anemia   . 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[place])    autologous stem cell bone marrow transplant on 04/23/2019.  Marland Kitchen Fatty liver    on u/s 07/2018  . Hyperlipidemia 03/2004  . Hypertension 1994  . Multiple myeloma ([place])   . Snores     SURGICAL HISTORY: Past Surgical History:  Procedure Laterality Date  . CATARACT EXTRACTION W/PHACO Right 03/04/2018   Procedure: CATARACT EXTRACTION PHACO AND INTRAOCULAR LENS PLACEMENT ([place])  RIGHT TORIC;  Surgeon: Leandrew Koyanagi, MD;  Location: [place];  Service: Ophthalmology;  Laterality: Right;  [place] no Toric Lens 1:45 5.3  . COLONOSCOPY WITH PROPOFOL N/A 07/20/2018   Procedure: COLONOSCOPY WITH PROPOFOL;  Surgeon: Jonathon Bellows, MD;  Location[facility] ENDOSCOPY;  Service: Gastroenterology;  Laterality: N/A;  . ESOPHAGOGASTRODUODENOSCOPY (EGD) WITH PROPOFOL N/A 07/20/2018   Procedure: ESOPHAGOGASTRODUODENOSCOPY (EGD) WITH PROPOFOL;  Surgeon: Jonathon Bellows, MD;  Location[facility] ENDOSCOPY;  Service: Gastroenterology;  Laterality: N/A;  . EYE SURGERY Right   . HERNIA REPAIR    . L Lap herniorraphy  04/2000  . Left wrist ganglionectomy      SOCIAL HISTORY: Social History   Socioeconomic History  . Marital status: Single    Spouse name: Not on file  . Number of children: 1  .  Years of education: Not on file  . Highest education level: Not on file  Occupational History  . Occupation: [employer]  Tobacco Use  . Smoking status: Never Smoker  . Smokeless tobacco: Never Used  . Tobacco comment: Occassionally  Vaping Use  . Vaping Use: Never used  Substance and Sexual Activity  . Alcohol use: Not Currently     Alcohol/week: 3.0 standard drinks    Types: 3 Cans of beer per week  . Drug use: No  . Sexual activity: Not on file  Other Topics Concern  . Not on file  Social History Narrative   Divorced Dec 28, 2007, dating as of 12-27-2017   His daughter died 4 days after giving birth to the patient's granddaughter   Has joint custody of his dead daughter's child   Works at [employer] is [employer]:   . Difficulty of Paying Living Expenses: Not on file  Food Insecurity:   . Worried About Charity fundraiser in the Last Year: Not on file  . Ran Out of Food in the Last Year: Not on file  Transportation Needs:   . Lack of Transportation (Medical): Not on file  . Lack of Transportation (Non-Medical): Not on file  Physical Activity:   . Days of Exercise per Week: Not on file  . Minutes of Exercise per Session: Not on file  Stress:   . Feeling of Stress : Not on file  Social Connections:   . Frequency of Communication with Friends and Family: Not on file  . Frequency of Social Gatherings with Friends and Family: Not on file  . Attends Religious Services: Not on file  . Active Member of Clubs or Organizations: Not on file  . Attends Archivist Meetings: Not on file  . Marital Status: Not on file  Intimate Partner Violence:   . Fear of Current or Ex-Partner: Not on file  . Emotionally Abused: Not on file  . Physically Abused: Not on file  . Sexually Abused: Not on file    FAMILY HISTORY: Family History  Problem Relation Age of Onset  . Hypertension Mother   . Diabetes Mother   . Heart disease Mother        CAD  . Dementia Mother   . Hypertension Father   . Heart disease Father        MI 02/03  . Colon cancer Father   . Hypertension Sister   . Hypertension Sister   . Hypertension Sister   . Prostate cancer Neg Hx     ALLERGIES:  is allergic to bactrim [sulfamethoxazole-trimethoprim], clonidine derivatives, and  tadalafil.  MEDICATIONS:  Current Outpatient Medications  Medication Sig Dispense Refill  . acetaminophen (TYLENOL) 325 MG tablet Take 650 mg by mouth every 6 (six) hours as needed.    Marland Kitchen amLODipine (NORVASC) 5 MG tablet TAKE 1-2 TABLETS (5-10 MG TOTAL) BY MOUTH DAILY. (Patient taking differently: Take 10 mg by mouth daily. ) 180 tablet 3  . B Complex Vitamins (VITAMIN B COMPLEX PO) SMARTSIG:By Mouth    . clopidogrel (PLAVIX) 75 MG tablet Take by mouth.    . co-enzyme Q-10 50 MG capsule Take 50 mg by mouth daily.    . cyanocobalamin ([employer] VITAMIN B12) 1000 MCG tablet Take 1 tablet (1,000 mcg total) by mouth daily. 90 tablet 3  . cyclobenzaprine (FLEXERIL) 5 MG tablet Take 1 tablet (5 mg total) by mouth 3 (three) times  daily as needed for muscle spasms. 30 tablet 0  . famotidine (PEPCID) 20 MG tablet Take 20 mg by mouth daily.     . hydrALAZINE (APRESOLINE) 10 MG tablet TAKE 1 TABLET (10 MG TOTAL) BY MOUTH 3 (THREE) TIMES DAILY. PLEASE MAKE AN APPOINTMENT FOR REFILLS. 270 tablet 1  . ixazomib citrate (NINLARO) 3 MG capsule Take 1 capsule (3 mg) by mouth weekly, 3 weeks on, 1 week off, repeat every 4 weeks. Take on an empty stomach 1hr before or 2hr after meals. 3 capsule 3  . lisinopril (ZESTRIL) 20 MG tablet Take 1 tablet (20 mg total) by mouth daily. Please make an appointment for physical exam prior to further refills. 90 tablet 0  . LORazepam (ATIVAN) 1 MG tablet TAKE 1 TABLET (1 MG TOTAL) BY MOUTH EVERY 6 (SIX) HOURS AS NEEDED FOR ANXIETY (FOR NAUSEA)    . magnesium chloride (SLOW-MAG) 64 MG TBEC SR tablet Take 1 tablet (64 mg total) by mouth daily. 60 tablet 0  . metoprolol succinate (TOPROL-XL) 50 MG 24 hr tablet TAKE 4 TABLETS (200 MG TOTAL) BY MOUTH DAILY. TAKE WITH OR IMMEDIATELY FOLLOWING A MEAL. 360 tablet 0  . ondansetron (ZOFRAN) 8 MG tablet Take 8 mg by mouth every 8 (eight) hours as needed. for nausea    . pravastatin (PRAVACHOL) 10 MG tablet TAKE 1 TABLET BY MOUTH EVERY DAY 90  tablet 3  . prochlorperazine (COMPAZINE) 10 MG tablet TAKE 1 TABLET (10 MG TOTAL) BY MOUTH EVERY 6 (SIX) HOURS AS NEEDED FOR NAUSEA    . zinc gluconate 50 MG tablet Take 50 mg by mouth daily.     No current facility-administered medications for this visit.     PHYSICAL EXAMINATION: ECOG PERFORMANCE STATUS: 1 - Symptomatic but completely ambulatory Vitals:   09/14/20 0909  BP: (!) 142/84  Pulse: 63  Resp: 16  Temp: (!) 97.3 F (36.3 C)  SpO2: 100%   Filed Weights   09/14/20 0909  Weight: 207 lb 8 oz (94.1 kg)    Physical Exam Constitutional:      General: He is not in acute distress. HENT:     Head: Normocephalic and atraumatic.  Eyes:     General: No scleral icterus. Cardiovascular:     Rate and Rhythm: Normal rate and regular rhythm.     Heart sounds: Normal heart sounds.  Pulmonary:     Effort: Pulmonary effort is normal. No respiratory distress.     Breath sounds: No wheezing.  Abdominal:     General: Bowel sounds are normal. There is no distension.     Palpations: Abdomen is soft.  Musculoskeletal:        General: No deformity. Normal range of motion.     Cervical back: Normal range of motion and neck supple.  Skin:    General: Skin is warm and dry.     Findings: No erythema or rash.  Neurological:     Mental Status: He is alert and oriented to person, place, and time. Mental status is at baseline.     Cranial Nerves: No cranial nerve deficit.     Coordination: Coordination normal.  Psychiatric:        Mood and Affect: Mood normal.      LABORATORY DATA:  I have reviewed the data as listed Lab Results  Component Value Date   WBC 3.8 (L) 09/14/2020   HGB 12.8 (L) 09/14/2020   HCT 37.4 (L) 09/14/2020   MCV 98.7 09/14/2020   PLT  70 (L) 09/14/2020   Recent Labs    04/27/20 1016 04/27/20 1016 05/30/20 0944 05/30/20 0944 06/22/20 0847 06/22/20 0847 07/20/20 0844 08/17/20 1101 09/14/20 0850  NA 138   < > 138   < > 139   < > 140 141 139  K 4.0    < > 4.4   < > 4.5   < > 4.1 4.1 3.9  CL 103   < > 104   < > 103   < > 108 104 101  CO2 26   < > 26   < > 26   < > $R'23 28 27  'YF$ GLUCOSE 103*   < > 102*   < > 104*   < > 110* 92 95  BUN 23   < > 20   < > 24*   < > 24* 23 22  CREATININE 1.41*   < > 1.29*   < > 1.55*   < > 1.35* 1.42* 1.38*  CALCIUM 9.6   < > 9.2   < > 9.8   < > 9.0 9.7 9.6  GFRNONAA 52*   < > 58*   < > 47*   < > 55* 55* 57*  GFRAA >60  --  >60  --  54*  --   --   --   --   PROT 7.8   < > 7.4   < > 7.6   < > 6.9 7.8 7.4  ALBUMIN 4.7   < > 4.4   < > 4.3   < > 4.0 4.5 4.6  AST 24   < > 23   < > 24   < > $R'20 22 21  'oY$ ALT 24   < > 24   < > 24   < > $R'22 21 24  'oC$ ALKPHOS 62   < > 66   < > 59   < > 53 70 59  BILITOT 0.9   < > 0.8   < > 0.8   < > 0.9 0.6 0.9   < > = values in this interval not displayed.   Iron/TIBC/Ferritin/ %Sat    Component Value Date/Time   IRON 74 07/01/2018 0944   TIBC 250 07/01/2018 0944   FERRITIN 357 07/01/2018 0944   IRONPCTSAT 30 07/01/2018 0944    Lab Results  Component Value Date   TOTALPROTELP 7.3 08/17/2020   ALBUMINELP 3.7 03/10/2019   A1GS 0.3 03/10/2019   A2GS 0.8 03/10/2019   BETS 1.3 03/10/2019   GAMS 0.6 03/10/2019   MSPIKE Not Observed 03/10/2019   SPEI Comment 03/10/2019   Lab Results  Component Value Date   KPAFRELGTCHN 44.2 (H) 08/17/2020   LAMBDASER 15.9 08/17/2020   KAPLAMBRATIO 2.78 (H) 08/17/2020    RADIOGRAPHIC STUDIES: I have personally reviewed the radiological images as listed and agreed with the findings in the report. 10/23/2018 FINDINGS: The heart size and mediastinal contours are within normal limits. Both lungs are clear. The visualized skeletal structures are unremarkable. IMPRESSION: No active cardiopulmonary disease.   ASSESSMENT & PLAN:  1. Multiple myeloma in remission ([place])   2. Encounter for antineoplastic chemotherapy   3. Stage 3a chronic kidney disease ([place])   4. Neck pain    #Light chain multiple myeloma beta 2 microglobulin 5 and normal  albumin.  Stage II. cytogenetics showed normal male chromosome, MDS FISH panel showed trisomy 12- Standard Risk. S/p RVD x 9 and  Autologous bone marrow stem cell transplant at [facility] on  04/23/2019. Bone marrow results were discussed with patient.  Patient is in remission.  Cytogenetics negative for trisomy 12.  Negative myeloma FISH panel.  Labs are reviewed and discussed with patient. Continue next cycle of ixizomab  # Lumbar lesion,  [facility] MRI results showed no metastatic spine lesion. Chronic back pain and neck pain, images are consistent with chronic degenerative disease.  Patient continues to follow-up with [place] orthopedic surgeon.  Thrombocytopenia,  Currently stable..   chronic kidney disease, creatinine is stable.  Encourage oral hydration.  Post bone marrow transplant care, He has completed 1 year of acyclovir.  Off acyclovir now. Post transplant vaccination at [facility].   #Bone health/osteopenia/multiple myeloma-  Continue Xgeva monthly for 2 years, patient will receive Xgeva next week.  Continue vitamin D and calcium supplementation.    Follow up 4 weeks for evaluation prior to starting next cycle of chemotherapy for maintenance.  Earlie Server, MD, PhD Hematology Oncology [place] at [facility] 09/14/2020

## 2020-09-14 NOTE — Progress Notes (Signed)
Patient here for follow up. No new concerns voiced.  °

## 2020-09-15 LAB — KAPPA/LAMBDA LIGHT CHAINS
Kappa free light chain: 41.4 mg/L — ABNORMAL HIGH (ref 3.3–19.4)
Kappa, lambda light chain ratio: 2.8 — ABNORMAL HIGH (ref 0.26–1.65)
Lambda free light chains: 14.8 mg/L (ref 5.7–26.3)

## 2020-09-18 LAB — MULTIPLE MYELOMA PANEL, SERUM
Albumin SerPl Elph-Mcnc: 3.9 g/dL (ref 2.9–4.4)
Albumin/Glob SerPl: 1.4 (ref 0.7–1.7)
Alpha 1: 0.2 g/dL (ref 0.0–0.4)
Alpha2 Glob SerPl Elph-Mcnc: 0.6 g/dL (ref 0.4–1.0)
B-Globulin SerPl Elph-Mcnc: 1 g/dL (ref 0.7–1.3)
Gamma Glob SerPl Elph-Mcnc: 1 g/dL (ref 0.4–1.8)
Globulin, Total: 2.8 g/dL (ref 2.2–3.9)
IgA: 171 mg/dL (ref 61–437)
IgG (Immunoglobin G), Serum: 1049 mg/dL (ref 603–1613)
IgM (Immunoglobulin M), Srm: 24 mg/dL (ref 20–172)
Total Protein ELP: 6.7 g/dL (ref 6.0–8.5)

## 2020-09-19 ENCOUNTER — Inpatient Hospital Stay: Payer: BC Managed Care – PPO

## 2020-09-19 DIAGNOSIS — M542 Cervicalgia: Secondary | ICD-10-CM | POA: Diagnosis not present

## 2020-09-19 DIAGNOSIS — N183 Chronic kidney disease, stage 3 unspecified: Secondary | ICD-10-CM | POA: Diagnosis not present

## 2020-09-19 DIAGNOSIS — E785 Hyperlipidemia, unspecified: Secondary | ICD-10-CM | POA: Diagnosis not present

## 2020-09-19 DIAGNOSIS — I129 Hypertensive chronic kidney disease with stage 1 through stage 4 chronic kidney disease, or unspecified chronic kidney disease: Secondary | ICD-10-CM | POA: Diagnosis not present

## 2020-09-19 DIAGNOSIS — D696 Thrombocytopenia, unspecified: Secondary | ICD-10-CM | POA: Diagnosis not present

## 2020-09-19 DIAGNOSIS — Z9484 Stem cells transplant status: Secondary | ICD-10-CM | POA: Diagnosis not present

## 2020-09-19 DIAGNOSIS — Z79899 Other long term (current) drug therapy: Secondary | ICD-10-CM | POA: Diagnosis not present

## 2020-09-19 DIAGNOSIS — N179 Acute kidney failure, unspecified: Secondary | ICD-10-CM

## 2020-09-19 DIAGNOSIS — C9000 Multiple myeloma not having achieved remission: Secondary | ICD-10-CM | POA: Diagnosis not present

## 2020-09-19 DIAGNOSIS — K760 Fatty (change of) liver, not elsewhere classified: Secondary | ICD-10-CM | POA: Diagnosis not present

## 2020-09-19 DIAGNOSIS — Z7902 Long term (current) use of antithrombotics/antiplatelets: Secondary | ICD-10-CM | POA: Diagnosis not present

## 2020-09-19 MED ORDER — DENOSUMAB 120 MG/1.7ML ~~LOC~~ SOLN
120.0000 mg | Freq: Once | SUBCUTANEOUS | Status: AC
  Administered 2020-09-19: 120 mg via SUBCUTANEOUS
  Filled 2020-09-19: qty 1.7

## 2020-09-26 ENCOUNTER — Other Ambulatory Visit: Payer: Self-pay | Admitting: Family Medicine

## 2020-09-27 ENCOUNTER — Telehealth: Payer: Self-pay | Admitting: Family Medicine

## 2020-09-27 DIAGNOSIS — I779 Disorder of arteries and arterioles, unspecified: Secondary | ICD-10-CM

## 2020-09-27 NOTE — Telephone Encounter (Signed)
Please call patient.  Due for follow-up carotid ultrasound.  This is a follow-up from last year.  I wanted him to get a call from [place] before he got a call from scheduling.  Thanks.  I put in the order.

## 2020-09-28 NOTE — Telephone Encounter (Signed)
Patient advised. Appointment scheduled and patient is aware

## 2020-10-12 ENCOUNTER — Encounter: Payer: Self-pay | Admitting: Oncology

## 2020-10-12 ENCOUNTER — Other Ambulatory Visit: Payer: Self-pay

## 2020-10-12 ENCOUNTER — Inpatient Hospital Stay (HOSPITAL_BASED_OUTPATIENT_CLINIC_OR_DEPARTMENT_OTHER): Payer: BC Managed Care – PPO | Admitting: Oncology

## 2020-10-12 ENCOUNTER — Inpatient Hospital Stay: Payer: BC Managed Care – PPO

## 2020-10-12 VITALS — BP 157/91 | HR 60 | Temp 97.7°F | Wt 208.2 lb

## 2020-10-12 DIAGNOSIS — C9001 Multiple myeloma in remission: Secondary | ICD-10-CM

## 2020-10-12 DIAGNOSIS — Z9484 Stem cells transplant status: Secondary | ICD-10-CM | POA: Diagnosis not present

## 2020-10-12 DIAGNOSIS — M542 Cervicalgia: Secondary | ICD-10-CM | POA: Diagnosis not present

## 2020-10-12 DIAGNOSIS — K760 Fatty (change of) liver, not elsewhere classified: Secondary | ICD-10-CM | POA: Diagnosis not present

## 2020-10-12 DIAGNOSIS — R197 Diarrhea, unspecified: Secondary | ICD-10-CM

## 2020-10-12 DIAGNOSIS — D696 Thrombocytopenia, unspecified: Secondary | ICD-10-CM | POA: Diagnosis not present

## 2020-10-12 DIAGNOSIS — Z79899 Other long term (current) drug therapy: Secondary | ICD-10-CM | POA: Diagnosis not present

## 2020-10-12 DIAGNOSIS — M899 Disorder of bone, unspecified: Secondary | ICD-10-CM

## 2020-10-12 DIAGNOSIS — C9000 Multiple myeloma not having achieved remission: Secondary | ICD-10-CM

## 2020-10-12 DIAGNOSIS — Z5111 Encounter for antineoplastic chemotherapy: Secondary | ICD-10-CM

## 2020-10-12 DIAGNOSIS — N183 Chronic kidney disease, stage 3 unspecified: Secondary | ICD-10-CM | POA: Diagnosis not present

## 2020-10-12 DIAGNOSIS — Z7902 Long term (current) use of antithrombotics/antiplatelets: Secondary | ICD-10-CM | POA: Diagnosis not present

## 2020-10-12 DIAGNOSIS — E785 Hyperlipidemia, unspecified: Secondary | ICD-10-CM | POA: Diagnosis not present

## 2020-10-12 DIAGNOSIS — I129 Hypertensive chronic kidney disease with stage 1 through stage 4 chronic kidney disease, or unspecified chronic kidney disease: Secondary | ICD-10-CM | POA: Diagnosis not present

## 2020-10-12 LAB — CBC WITH DIFFERENTIAL/PLATELET
Abs Immature Granulocytes: 0.02 10*3/uL (ref 0.00–0.07)
Basophils Absolute: 0 10*3/uL (ref 0.0–0.1)
Basophils Relative: 0 %
Eosinophils Absolute: 0 10*3/uL (ref 0.0–0.5)
Eosinophils Relative: 1 %
HCT: 37.4 % — ABNORMAL LOW (ref 39.0–52.0)
Hemoglobin: 12.9 g/dL — ABNORMAL LOW (ref 13.0–17.0)
Immature Granulocytes: 0 %
Lymphocytes Relative: 29 %
Lymphs Abs: 1.4 10*3/uL (ref 0.7–4.0)
MCH: 34 pg (ref 26.0–34.0)
MCHC: 34.5 g/dL (ref 30.0–36.0)
MCV: 98.7 fL (ref 80.0–100.0)
Monocytes Absolute: 0.5 10*3/uL (ref 0.1–1.0)
Monocytes Relative: 10 %
Neutro Abs: 2.8 10*3/uL (ref 1.7–7.7)
Neutrophils Relative %: 60 %
Platelets: 78 10*3/uL — ABNORMAL LOW (ref 150–400)
RBC: 3.79 MIL/uL — ABNORMAL LOW (ref 4.22–5.81)
RDW: 13.3 % (ref 11.5–15.5)
WBC: 4.7 10*3/uL (ref 4.0–10.5)
nRBC: 0 % (ref 0.0–0.2)

## 2020-10-12 LAB — COMPREHENSIVE METABOLIC PANEL
ALT: 27 U/L (ref 0–44)
AST: 23 U/L (ref 15–41)
Albumin: 4.6 g/dL (ref 3.5–5.0)
Alkaline Phosphatase: 59 U/L (ref 38–126)
Anion gap: 7 (ref 5–15)
BUN: 20 mg/dL (ref 8–23)
CO2: 28 mmol/L (ref 22–32)
Calcium: 9.4 mg/dL (ref 8.9–10.3)
Chloride: 103 mmol/L (ref 98–111)
Creatinine, Ser: 1.22 mg/dL (ref 0.61–1.24)
GFR, Estimated: >60 mL/min (ref >60)
Glucose, Bld: 103 mg/dL — ABNORMAL HIGH (ref 70–99)
Potassium: 4.1 mmol/L (ref 3.5–5.1)
Sodium: 138 mmol/L (ref 135–145)
Total Bilirubin: 0.7 mg/dL (ref 0.3–1.2)
Total Protein: 7.7 g/dL (ref 6.5–8.1)

## 2020-10-12 NOTE — Progress Notes (Signed)
Hematology/Oncology  Follow up note [facility] [phone] [phone]   Patient Care Team: Tonia Ghent, MD as PCP - General (Family Medicine) Earlie Server, MD as Consulting Physician (Oncology) Beather Arbour Lilyan Punt, MD as Referring Physician (Hematology and Oncology) Diannia Ruder, MD as Referring Physician (Hematology and Oncology)  REASON FOR VISIT Follow up for multiple myeloma.   HISTORY OF PRESENTING ILLNESS:  Luis Burke is a  64 y.o.  male with PMH listed below presents for follow up of multiple myeloma.  # 07/27/2018 multiple myeloma panel showed M protein of 0.1, IgG 662, IgA 49, IgM 7. Patient was called back to further labs done. 08/10/2018, free light chain ratio showed extremely high level of kappa free light chain 10,183, with a kappa lambda light chain ratio of 1414.31 LDH 164 Beta-2 microglobulin 5 Patient was called and discuss about results.  He was recommended to undergo bone marrow biopsy and PET scan. 08/19/2018 bone marrow biopsy showed hypercellular marrow 80%, involved by plasma cell neoplasm up to 95%.  Consistent with plasma cell myeloma. Myeloma FISH trisomy 12  09/10/2018 skeletal survey showed questionable small lucent lesions within the midshaft of the humerus bilaterally.  Otherwise no suspicious focal lytic lesion or acute bone abnormality. 08/25/2018 PET scan showed no definite hypermetabolic bone disease but CT findings are highly suspicious for numerous small myelomatous lesions involving the spine, sternum and scattered ribs.  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 He received preparative regimen with melphalan 200 mg/m on 04/22/2019 followed by autologous stem cell infusion on 04/23/2019. Currently he is transfusion independent.  Transfusion criteria with as needed hemoglobin less than 7.5 for platelet less than 10,000.  Transplant course was complicated with febrile  neutropenia grade 3, treated with vancomycin and cephapirin until engraftment on 05/06/2019. Patient also had engraftment syndrome and had to be started on Solu-Medrol 25 mg twice daily on 05/05/2019.  Transition to Medrol Dosepak on 05/07/2019 to complete steroid taper as outpatient.  Patient is currently on acyclovir for 1 year after transplant, dose was reduced on 722 11/20/2018 due to renal function. Patient had a baseline CKD with creatinine running between 1.4-1.6.  He had acute on chronic kidney injury and creatinine went up to 2.2 on 722.  Fluid hydration was given and creatinine decreased to baseline 1.5-1.7. His Hickman catheter was removed.  Candida Cruris treated with topical clotrimazole 1% 3 times daily and to resolution..  #  seen by Dr. Tonia Brooms BMT team on 06/30/2019.revaccination at [place] begins at 12 months post transplant I discussed with [facility] hematology Dr.Choi and he recommends plans as listed below.  -patient to be started on Ixazomib maintenance: -Ixazomib 64m on D1/D8/D15 out of 28 day cycle.  -patient will start re-vaccination 12 months post transplant.  -Post transplant bone marrow biopsy if clinically indicated.  # seen by [facility] bone marrow transplant team in June 2021.  Patient has been started on immunization protocol.  He reports no new complaints.  #07/12/2020 [facility] Lucent lesion in the L4 spinous process measuring 4 mm which is  nonspecific. Consider confirmation of marrow replacing process with MRI.  2. Incompletely characterized renal cysts   # He continues to have chronic back pain and neck pain.  [facility], MRI lumbar spine with and without contrast showed no evidence of spinal metastasis.  Lumbar spondylosis is most pronounced at L5-S1 where there is severe  right and moderate left neural foraminal stenosis. 08/11/2020, x-ray cervical spine complete with flexion and extension showed The cervical spine is  visualized from C1 to the top of T1 on the lateral  view.  Reversal of the normal cervical lordosis with a focal kyphosis centered at the C5-C6 intervertebral level. Trace anterolisthesis of C4 onto C5. Trace retrolisthesis of C6 on C7. No evidence ofdynamic instability is noted on  the flexion or extension views.   No prevertebral soft tissue swelling. Cervical vertebral body heights are maintained. Multilevel degenerative changes of the cervical spine including discogenic disease, most pronounced at C5-C6 and C6-C7, and facet arthropathy most pronounced at C4-C5. Multilevel mild to moderate left neural foraminal osseous narrowing of the mid to lower cervical vertebrae, possibly centrally/artifactual by patient positioning.  #07/28/2020 bone marrow normocellular marrow with polytypic plasmacytosis, 3 to 8%   Patient is in remission.  Cytogenetics negative for trisomy 12.  Negative myeloma FISH panel. INTERVAL HISTORY Luis Burke is a 64 y.o. 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 #Patient is currently on ixizomab 3 mg on day 1, day 8, day 15 out of 28 days. Patient is starting next cycle of treatment today. Diarrhea for the past 3 days. Initially watery stool, gradually improving.  Now semiformed bowel movements. No fever, chills, abdominal pain, nausea vomiting.   Review of Systems  Constitutional: Negative for appetite change, chills, fatigue, fever and unexpected weight change.  HENT:   Negative for hearing loss and voice change.   Eyes: Negative for eye problems and icterus.  Respiratory: Negative for chest tightness, cough and shortness of breath.   Cardiovascular: Negative for chest pain and leg swelling.  Gastrointestinal: Negative for abdominal distention and abdominal pain.  Endocrine: Negative for hot flashes.  Genitourinary: Negative for difficulty urinating, dysuria and frequency.   Musculoskeletal:  Positive for back pain and neck pain. Negative for arthralgias.  Skin: Negative for itching and rash.  Neurological: Negative for light-headedness and numbness.  Hematological: Negative for adenopathy. Does not bruise/bleed easily.  Psychiatric/Behavioral: Negative for confusion.    MEDICAL HISTORY:  Past Medical History:  Diagnosis Date  . AKI (acute kidney injury) ([place]) 01/06/2019  . Anemia   . 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[place])    autologous stem cell bone marrow transplant on 04/23/2019.  Marland Kitchen Fatty liver    on u/s 07/2018  . Hyperlipidemia 03/2004  . Hypertension 1994  . Multiple myeloma ([place])   . Snores     SURGICAL HISTORY: Past Surgical History:  Procedure Laterality Date  . CATARACT EXTRACTION W/PHACO Right 03/04/2018   Procedure: CATARACT EXTRACTION PHACO AND INTRAOCULAR LENS PLACEMENT ([place])  RIGHT TORIC;  Surgeon: Leandrew Koyanagi, MD;  Location: [place];  Service: Ophthalmology;  Laterality: Right;  [place] no Toric Lens 1:45 5.3  . COLONOSCOPY WITH PROPOFOL N/A 07/20/2018   Procedure: COLONOSCOPY WITH PROPOFOL;  Surgeon: Jonathon Bellows, MD;  Location: [facility] ENDOSCOPY;  Service: Gastroenterology;  Laterality: N/A;  . ESOPHAGOGASTRODUODENOSCOPY (EGD) WITH PROPOFOL N/A 07/20/2018   Procedure: ESOPHAGOGASTRODUODENOSCOPY (EGD) WITH PROPOFOL;  Surgeon: Jonathon Bellows, MD;  Location[facility] END[facility]COPY;  Service: Gastroenterology;  Laterality: N/A;  . EYE SURGERY Right   . HERNIA REPAIR    . L Lap herniorraphy  04/2000  . Left wrist ganglionectomy      SOCIAL HISTORY: Social History   Socioeconomic History  . Marital status: Single    Spouse name: Not on file  .  Number of children: 1  . Years of education: Not on file  . Highest education level: Not on file  Occupational History  . Occupation: [employer]  Tobacco Use  . Smoking status: Never Smoker  . Smokeless tobacco: Never Used  . Tobacco comment: Occassionally  Vaping Use  . Vaping Use: Never used   Substance and Sexual Activity  . Alcohol use: Not Currently    Alcohol/week: 3.0 standard drinks    Types: 3 Cans of beer per week  . Drug use: No  . Sexual activity: Not on file  Other Topics Concern  . 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 Hypertension Mother   . Diabetes Mother   . Heart disease Mother        CAD  . Dementia Mother   . Hypertension Father   . Heart disease Father        MI 02/03  . Colon cancer Father   . Hypertension Sister   . Hypertension Sister   . Hypertension Sister   . Prostate cancer Neg Hx     ALLERGIES:  is allergic to bactrim [sulfamethoxazole-trimethoprim], clonidine derivatives, and tadalafil.  MEDICATIONS:  Current Outpatient Medications  Medication Sig Dispense Refill  . acetaminophen (TYLENOL) 325 MG tablet Take 650 mg by mouth every 6 (six) hours as needed.    Marland Kitchen amLODipine (NORVASC) 5 MG tablet TAKE 1-2 TABLETS (5-10 MG TOTAL) BY MOUTH DAILY. (Patient taking differently: Take 10 mg by mouth daily.) 180 tablet 3  . B Complex Vitamins (VITAMIN B COMPLEX PO) SMARTSIG:By Mouth    . clopidogrel (PLAVIX) 75 MG tablet Take by mouth.    . co-enzyme Q-10 50 MG capsule Take 50 mg by mouth daily.    . cyanocobalamin ([employer] VITAMIN B12) 1000 MCG tablet Take 1 tablet (1,000 mcg total) by mouth daily. 90 tablet 3  . cyclobenzaprine (FLEXERIL) 5 MG tablet Take 1 tablet (5 mg total) by mouth 3 (three) times daily as  needed for muscle spasms. 30 tablet 0  . famotidine (PEPCID) 20 MG tablet Take 20 mg by mouth daily.     . hydrALAZINE (APRESOLINE) 10 MG tablet TAKE 1 TABLET (10 MG TOTAL) BY MOUTH 3 (THREE) TIMES DAILY. PLEASE MAKE AN APPOINTMENT FOR REFILLS. 270 tablet 1  . ixazomib citrate (NINLARO) 3 MG capsule Take 1 capsule (3 mg) by mouth weekly, 3 weeks on, 1 week off, repeat every 4 weeks. Take on an empty stomach 1hr before or 2hr after meals. 3 capsule 3  . lisinopril (ZESTRIL) 20 MG tablet TAKE 1 TABLET BY MOUTH EVERY DAY. PLEASE MAKE APPOINTMENT FOR FURTHER REFILLS. 90 tablet 0  . LORazepam (ATIVAN) 1 MG tablet TAKE 1 TABLET (1 MG TOTAL) BY MOUTH EVERY 6 (SIX) HOURS AS NEEDED FOR ANXIETY (FOR NAUSEA)    . magnesium chloride (SLOW-MAG) 64 MG TBEC SR tablet Take 1 tablet (64 mg  total) by mouth daily. 60 tablet 0  . metoprolol succinate (TOPROL-XL) 50 MG 24 hr tablet TAKE 4 TABLETS (200 MG TOTAL) BY MOUTH DAILY. TAKE WITH OR IMMEDIATELY FOLLOWING A MEAL. 360 tablet 0  . ondansetron (ZOFRAN) 8 MG tablet Take 8 mg by mouth every 8 (eight) hours as needed. for nausea    . pravastatin (PRAVACHOL) 10 MG tablet TAKE 1 TABLET BY MOUTH EVERY DAY 90 tablet 3  . prochlorperazine (COMPAZINE) 10 MG tablet TAKE 1 TABLET (10 MG TOTAL) BY MOUTH EVERY 6 (SIX) HOURS AS NEEDED FOR NAUSEA    . zinc gluconate 50 MG tablet Take 50 mg by mouth daily.     No current facility-administered medications for this visit.     PHYSICAL EXAMINATION: ECOG PERFORMANCE STATUS: 1 - Symptomatic but completely ambulatory Vitals:   10/12/20 1042  BP: (!) 157/91  Pulse: 60  Temp: 97.7 F (36.5 C)  SpO2: 97%   Filed Weights   10/12/20 1042  Weight: 208 lb 3.2 oz (94.4 kg)    Physical Exam Constitutional:      General: He is not in acute distress. HENT:     Head: Normocephalic and atraumatic.  Eyes:     General: No scleral icterus. Cardiovascular:     Rate and Rhythm: Normal rate and regular rhythm.     Heart sounds:  Normal heart sounds.  Pulmonary:     Effort: Pulmonary effort is normal. No respiratory distress.     Breath sounds: No wheezing.  Abdominal:     General: Bowel sounds are normal. There is no distension.     Palpations: Abdomen is soft.  Musculoskeletal:        General: No deformity. Normal range of motion.     Cervical back: Normal range of motion and neck supple.  Skin:    General: Skin is warm and dry.     Findings: No erythema or rash.  Neurological:     Mental Status: He is alert and oriented to person, place, and time. Mental status is at baseline.     Cranial Nerves: No cranial nerve deficit.     Coordination: Coordination normal.  Psychiatric:        Mood and Affect: Mood normal.      LABORATORY DATA:  I have reviewed the data as listed Lab Results  Component Value Date   WBC 4.7 10/12/2020   HGB 12.9 (L) 10/12/2020   HCT 37.4 (L) 10/12/2020   MCV 98.7 10/12/2020   PLT 78 (L) 10/12/2020   Recent Labs    04/27/20 1016 05/30/20 0944 06/22/20 0847 07/20/20 0844 08/17/20 1101 09/14/20 0850 10/12/20 1031  NA 138 138 139   < > 141 139 138  K 4.0 4.4 4.5   < > 4.1 3.9 4.1  CL 103 104 103   < > 104 101 103  CO2 _0 < > _1 GLUCOSE 103* 102* 104*   < > 92 95 103*  BUN 23 20 24*   < > _2 CREATININE 1.41* 1.29* 1.55*   < > 1.42* 1.38* 1.22  CALCIUM 9.6 9.2 9.8   < > 9.7 9.6 9.4  GFRNONAA 52* 58* 47*   < > 55* 57* >60  GFRAA >60 >60 54*  --   --   --   --   PROT 7.8 7.4 7.6   < > 7.8 7.4 7.7  ALBUMIN 4.7 4.4 4.3   < >  4.5 4.6 4.6  AST _0 < > _1 ALT _2 < > _3 ALKPHOS 62 66 59   < > 70 59 59  BILITOT 0.9 0.8 0.8   < > 0.6 0.9 0.7   < > = values in this interval not displayed.   Iron/TIBC/Ferritin/ %Sat    Component Value Date/Time   IRON 74 07/01/2018 0944   TIBC 250 07/01/2018 0944   FERRITIN 357 07/01/2018 0944   IRONPCTSAT 30 07/01/2018 0944    Lab Results  Component Value Date   TOTALPROTELP 6.7  09/14/2020   ALBUMINELP 3.7 03/10/2019   A1GS 0.3 03/10/2019   A2GS 0.8 03/10/2019   BETS 1.3 03/10/2019   GAMS 0.6 03/10/2019   MSPIKE Not Observed 03/10/2019   SPEI Comment 03/10/2019   Lab Results  Component Value Date   KPAFRELGTCHN 41.4 (H) 09/14/2020   LAMBDASER 14.8 09/14/2020   KAPLAMBRATIO 2.80 (H) 09/14/2020    RADIOGRAPHIC STUDIES: I have personally reviewed the radiological images as listed and agreed with the findings in the report. 10/23/2018 FINDINGS: The heart size and mediastinal contours are within normal limits. Both lungs are clear. The visualized skeletal structures are unremarkable. IMPRESSION: No active cardiopulmonary disease.   ASSESSMENT & PLAN:  1. Multiple myeloma in remission ([place])   2. Encounter for antineoplastic chemotherapy   3. Bone lesion   4. Diarrhea, unspecified type    #Light chain multiple myeloma beta 2 microglobulin 5 and normal albumin.  Stage II. cytogenetics showed normal male chromosome, MDS FISH panel showed trisomy 50- Standard Risk. S/p RVD x 9 and  [facility]ologous [facility] on 04[facility]23/2019. Discussed with patient. Recommend patient to hold off ixazomib today.  Start next week.  #Diarrhea, gradually getting better.  We will delay start of the cycles of maintenance treatment. # Lumbar lesion,  [facility] MRI results showed no metastatic spine lesion. Chronic back pain and neck pain, images are consistent with chronic degenerative disease.  Patient continues to follow-up with [place] orthopedic surgeon.  Thrombocytopenia, count is 78.  Stable. chronic kidney disease, creatinine is stable.  Encourage oral hydration.  Post bone marrow transplant care, He has completed 1 year of acyclovir.  Off acyclovir now. Post [facility] vaccination at [facility].   #Bone health/osteopenia/multiple myeloma-  Continue Xgeva monthly for 2 years, patient will receive Xgeva next week.   Recommend calcium and vitamin D  supplementation. Follow up 5 weeks for evaluation prior to starting next cycle of chemotherapy for maintenance.  Earlie Server, MD, PhD Hematology Oncology [place] at [facility] 10/12/2020

## 2020-10-12 NOTE — Progress Notes (Signed)
Pt here for follow up. Pt reports that he has had diarrhea for the past 3 days.

## 2020-10-16 LAB — MULTIPLE MYELOMA PANEL, SERUM
Albumin SerPl Elph-Mcnc: 4.5 g/dL — ABNORMAL HIGH (ref 2.9–4.4)
Albumin/Glob SerPl: 1.7 (ref 0.7–1.7)
Alpha 1: 0.2 g/dL (ref 0.0–0.4)
Alpha2 Glob SerPl Elph-Mcnc: 0.7 g/dL (ref 0.4–1.0)
B-Globulin SerPl Elph-Mcnc: 1 g/dL (ref 0.7–1.3)
Gamma Glob SerPl Elph-Mcnc: 0.9 g/dL (ref 0.4–1.8)
Globulin, Total: 2.7 g/dL (ref 2.2–3.9)
IgA: 192 mg/dL (ref 61–437)
IgG (Immunoglobin G), Serum: 1089 mg/dL (ref 603–1613)
IgM (Immunoglobulin M), Srm: 22 mg/dL (ref 20–172)
Total Protein ELP: 7.2 g/dL (ref 6.0–8.5)

## 2020-10-16 LAB — KAPPA/LAMBDA LIGHT CHAINS
Kappa free light chain: 36 mg/L — ABNORMAL HIGH (ref 3.3–19.4)
Kappa, lambda light chain ratio: 2.55 — ABNORMAL HIGH (ref 0.26–1.65)
Lambda free light chains: 14.1 mg/L (ref 5.7–26.3)

## 2020-10-19 ENCOUNTER — Inpatient Hospital Stay: Payer: BC Managed Care – PPO | Attending: Oncology

## 2020-10-19 DIAGNOSIS — N179 Acute kidney failure, unspecified: Secondary | ICD-10-CM

## 2020-10-19 DIAGNOSIS — M899 Disorder of bone, unspecified: Secondary | ICD-10-CM | POA: Diagnosis not present

## 2020-10-19 DIAGNOSIS — C9000 Multiple myeloma not having achieved remission: Secondary | ICD-10-CM | POA: Diagnosis not present

## 2020-10-19 MED ORDER — DENOSUMAB 120 MG/1.7ML ~~LOC~~ SOLN
120.0000 mg | Freq: Once | SUBCUTANEOUS | Status: AC
  Administered 2020-10-19: 120 mg via SUBCUTANEOUS
  Filled 2020-10-19: qty 1.7

## 2020-10-24 ENCOUNTER — Other Ambulatory Visit: Payer: Self-pay | Admitting: Family Medicine

## 2020-11-01 DIAGNOSIS — Z9481 Bone marrow transplant status: Secondary | ICD-10-CM | POA: Diagnosis not present

## 2020-11-01 DIAGNOSIS — Z23 Encounter for immunization: Secondary | ICD-10-CM | POA: Diagnosis not present

## 2020-11-01 DIAGNOSIS — M48061 Spinal stenosis, lumbar region without neurogenic claudication: Secondary | ICD-10-CM | POA: Diagnosis not present

## 2020-11-01 DIAGNOSIS — C9000 Multiple myeloma not having achieved remission: Secondary | ICD-10-CM | POA: Diagnosis not present

## 2020-11-01 DIAGNOSIS — Z9484 Stem cells transplant status: Secondary | ICD-10-CM | POA: Diagnosis not present

## 2020-11-01 DIAGNOSIS — Z7902 Long term (current) use of antithrombotics/antiplatelets: Secondary | ICD-10-CM | POA: Diagnosis not present

## 2020-11-01 DIAGNOSIS — M47816 Spondylosis without myelopathy or radiculopathy, lumbar region: Secondary | ICD-10-CM | POA: Diagnosis not present

## 2020-11-09 ENCOUNTER — Other Ambulatory Visit: Payer: Self-pay

## 2020-11-09 ENCOUNTER — Ambulatory Visit (INDEPENDENT_AMBULATORY_CARE_PROVIDER_SITE_OTHER): Payer: BC Managed Care – PPO

## 2020-11-09 ENCOUNTER — Encounter: Payer: Self-pay | Admitting: Oncology

## 2020-11-09 DIAGNOSIS — I779 Disorder of arteries and arterioles, unspecified: Secondary | ICD-10-CM | POA: Diagnosis not present

## 2020-11-13 ENCOUNTER — Encounter: Payer: Self-pay | Admitting: Family Medicine

## 2020-11-13 DIAGNOSIS — M5030 Other cervical disc degeneration, unspecified cervical region: Secondary | ICD-10-CM | POA: Diagnosis not present

## 2020-11-13 DIAGNOSIS — M5136 Other intervertebral disc degeneration, lumbar region: Secondary | ICD-10-CM | POA: Diagnosis not present

## 2020-11-13 DIAGNOSIS — M542 Cervicalgia: Secondary | ICD-10-CM | POA: Diagnosis not present

## 2020-11-13 DIAGNOSIS — M899 Disorder of bone, unspecified: Secondary | ICD-10-CM | POA: Diagnosis not present

## 2020-11-13 DIAGNOSIS — M4802 Spinal stenosis, cervical region: Secondary | ICD-10-CM | POA: Diagnosis not present

## 2020-11-13 DIAGNOSIS — C9000 Multiple myeloma not having achieved remission: Secondary | ICD-10-CM | POA: Diagnosis not present

## 2020-11-13 DIAGNOSIS — G8929 Other chronic pain: Secondary | ICD-10-CM | POA: Diagnosis not present

## 2020-11-14 ENCOUNTER — Ambulatory Visit: Payer: Self-pay | Admitting: Urology

## 2020-11-16 ENCOUNTER — Encounter: Payer: Self-pay | Admitting: Oncology

## 2020-11-16 ENCOUNTER — Other Ambulatory Visit: Payer: Self-pay

## 2020-11-16 ENCOUNTER — Inpatient Hospital Stay (HOSPITAL_BASED_OUTPATIENT_CLINIC_OR_DEPARTMENT_OTHER): Payer: BC Managed Care – PPO | Admitting: Oncology

## 2020-11-16 ENCOUNTER — Inpatient Hospital Stay: Payer: BC Managed Care – PPO | Attending: Oncology

## 2020-11-16 ENCOUNTER — Inpatient Hospital Stay: Payer: BC Managed Care – PPO

## 2020-11-16 VITALS — BP 128/86 | HR 58 | Temp 96.9°F | Resp 16 | Wt 208.6 lb

## 2020-11-16 DIAGNOSIS — C9001 Multiple myeloma in remission: Secondary | ICD-10-CM | POA: Insufficient documentation

## 2020-11-16 DIAGNOSIS — I10 Essential (primary) hypertension: Secondary | ICD-10-CM | POA: Diagnosis not present

## 2020-11-16 DIAGNOSIS — D696 Thrombocytopenia, unspecified: Secondary | ICD-10-CM | POA: Diagnosis not present

## 2020-11-16 DIAGNOSIS — N189 Chronic kidney disease, unspecified: Secondary | ICD-10-CM | POA: Insufficient documentation

## 2020-11-16 DIAGNOSIS — M8580 Other specified disorders of bone density and structure, unspecified site: Secondary | ICD-10-CM | POA: Insufficient documentation

## 2020-11-16 DIAGNOSIS — E785 Hyperlipidemia, unspecified: Secondary | ICD-10-CM | POA: Diagnosis not present

## 2020-11-16 DIAGNOSIS — M899 Disorder of bone, unspecified: Secondary | ICD-10-CM

## 2020-11-16 DIAGNOSIS — Z9481 Bone marrow transplant status: Secondary | ICD-10-CM | POA: Diagnosis not present

## 2020-11-16 DIAGNOSIS — Z5111 Encounter for antineoplastic chemotherapy: Secondary | ICD-10-CM

## 2020-11-16 DIAGNOSIS — N179 Acute kidney failure, unspecified: Secondary | ICD-10-CM

## 2020-11-16 DIAGNOSIS — Z79899 Other long term (current) drug therapy: Secondary | ICD-10-CM | POA: Insufficient documentation

## 2020-11-16 LAB — CBC WITH DIFFERENTIAL/PLATELET
Abs Immature Granulocytes: 0.02 10*3/uL (ref 0.00–0.07)
Basophils Absolute: 0 10*3/uL (ref 0.0–0.1)
Basophils Relative: 0 %
Eosinophils Absolute: 0.2 10*3/uL (ref 0.0–0.5)
Eosinophils Relative: 3 %
HCT: 36.1 % — ABNORMAL LOW (ref 39.0–52.0)
Hemoglobin: 12.5 g/dL — ABNORMAL LOW (ref 13.0–17.0)
Immature Granulocytes: 0 %
Lymphocytes Relative: 34 %
Lymphs Abs: 1.9 10*3/uL (ref 0.7–4.0)
MCH: 33.6 pg (ref 26.0–34.0)
MCHC: 34.6 g/dL (ref 30.0–36.0)
MCV: 97 fL (ref 80.0–100.0)
Monocytes Absolute: 0.5 10*3/uL (ref 0.1–1.0)
Monocytes Relative: 9 %
Neutro Abs: 3 10*3/uL (ref 1.7–7.7)
Neutrophils Relative %: 54 %
Platelets: 92 10*3/uL — ABNORMAL LOW (ref 150–400)
RBC: 3.72 MIL/uL — ABNORMAL LOW (ref 4.22–5.81)
RDW: 12.8 % (ref 11.5–15.5)
WBC: 5.7 10*3/uL (ref 4.0–10.5)
nRBC: 0 % (ref 0.0–0.2)

## 2020-11-16 LAB — COMPREHENSIVE METABOLIC PANEL
ALT: 19 U/L (ref 0–44)
AST: 23 U/L (ref 15–41)
Albumin: 4.3 g/dL (ref 3.5–5.0)
Alkaline Phosphatase: 66 U/L (ref 38–126)
Anion gap: 8 (ref 5–15)
BUN: 20 mg/dL (ref 8–23)
CO2: 25 mmol/L (ref 22–32)
Calcium: 9.2 mg/dL (ref 8.9–10.3)
Chloride: 105 mmol/L (ref 98–111)
Creatinine, Ser: 1.24 mg/dL (ref 0.61–1.24)
GFR, Estimated: >60 mL/min (ref >60)
Glucose, Bld: 103 mg/dL — ABNORMAL HIGH (ref 70–99)
Potassium: 3.9 mmol/L (ref 3.5–5.1)
Sodium: 138 mmol/L (ref 135–145)
Total Bilirubin: 0.7 mg/dL (ref 0.3–1.2)
Total Protein: 7.6 g/dL (ref 6.5–8.1)

## 2020-11-16 MED ORDER — DENOSUMAB 120 MG/1.7ML ~~LOC~~ SOLN
120.0000 mg | Freq: Once | SUBCUTANEOUS | Status: AC
Start: 2020-11-16 — End: 2020-11-16
  Administered 2020-11-16: 120 mg via SUBCUTANEOUS
  Filled 2020-11-16: qty 1.7

## 2020-11-16 NOTE — Progress Notes (Signed)
Hematology/Oncology  Follow up note [facility] [phone] [phone]   Patient Care Team: Duncan, Graham S, MD as PCP - General (Family Medicine) Ashaunte Standley, MD as Consulting Physician (Oncology) Sung, Anthony Derek, MD as Referring Physician (Hematology and Oncology) Choi, Taewoong, MD as Referring Physician (Hematology and Oncology)  REASON FOR VISIT Follow up for multiple myeloma.   HISTORY OF PRESENTING ILLNESS:  Luis Burke is a  64 y.o.  male with PMH listed below presents for follow up of multiple myeloma.  # 07/27/2018 multiple myeloma panel showed M protein of 0.1, IgG 662, IgA 49, IgM 7. Patient was called back to further labs done. 08/10/2018, free light chain ratio showed extremely high level of kappa free light chain 10,183, with a kappa lambda light chain ratio of 1414.31 LDH 164 Beta-2 microglobulin 5 Patient was called and discuss about results.  He was recommended to undergo bone marrow biopsy and PET scan. 08/19/2018 bone marrow biopsy showed hypercellular marrow 80%, involved by plasma cell neoplasm up to 95%.  Consistent with plasma cell myeloma. Myeloma FISH  gain of gain of CEP12  09/10/2018 skeletal survey showed questionable small lucent lesions within the midshaft of the humerus bilaterally.  Otherwise no suspicious focal lytic lesion or acute bone abnormality. 08/25/2018 PET scan showed no definite hypermetabolic bone disease but CT findings are highly suspicious for numerous small myelomatous lesions involving the spine, sternum and scattered ribs.  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 He received preparative regimen with melphalan 200 mg/m on 04/22/2019 followed by autologous stem cell infusion on 04/23/2019. Currently he is transfusion independent.  Transfusion criteria with as needed hemoglobin less than 7.5 for platelet less than 10,000.  Transplant course was complicated with  febrile neutropenia grade 3, treated with vancomycin and cephapirin until engraftment on 05/06/2019. Patient also had engraftment syndrome and had to be started on Solu-Medrol 25 mg twice daily on 05/05/2019.  Transition to Medrol Dosepak on 05/07/2019 to complete steroid taper as outpatient.  Patient is currently on acyclovir for 1 year after transplant, dose was reduced on 722 11/20/2018 due to renal function. Patient had a baseline CKD with creatinine running between 1.4-1.6.  He had acute on chronic kidney injury and creatinine went up to 2.2 on 722.  Fluid hydration was given and creatinine decreased to baseline 1.5-1.7. His Hickman catheter was removed.  Candida Cruris treated with topical clotrimazole 1% 3 times daily and to resolution..  #  seen by Dr. Choi [facility] team on 06/30/2019.revaccination at [facility] begins at 12 months post transplant I discussed with [facility] hematology Dr.Choi and he recommends plans as listed below.  -patient to be started on Ixazomib maintenance: -Ixazomib 3mg on D1/D8/D15 out of 28 day cycle.  -patient will start re-vaccination 12 months post transplant.  -Post transplant bone marrow biopsy if clinically indicated.  # seen by [facility] bone marrow transplant team in June 2021.  Patient has been started on immunization protocol.  He reports no new complaints.  #07/12/2020 CT skeletal survey done at [facility] 1. Lucent lesion in the L4 spinous process measuring 4 mm which is  nonspecific. Consider confirmation of marrow replacing process with MRI.  2. Incompletely characterized renal cysts   # He continues to have chronic back pain and neck pain.  Had MRI spine at the [facility]  08/05/2020, MRI lumbar spine with and without contrast showed no evidence of spinal metastasis.  Lumbar spondylosis is most pronounced at L5-S1   where there is severe right and moderate left neural foraminal stenosis. 08/11/2020, x-ray cervical spine complete with flexion and extension showed The cervical spine  is visualized from C1 to the top of T1 on the lateral  view.  Reversal of the normal cervical lordosis with a focal kyphosis centered at the C5-C6 intervertebral level. Trace anterolisthesis of C4 onto C5. Trace retrolisthesis of C6 on C7. No evidence ofdynamic instability is noted on  the flexion or extension views.   No prevertebral soft tissue swelling. Cervical vertebral body heights are maintained. Multilevel degenerative changes of the cervical spine including discogenic disease, most pronounced at C5-C6 and C6-C7, and facet arthropathy most pronounced at C4-C5. Multilevel mild to moderate left neural foraminal osseous narrowing of the mid to lower cervical vertebrae, possibly centrally/artifactual by patient positioning.  #07/28/2020 bone marrow normocellular marrow with polytypic plasmacytosis, 3 to 8%   Patient is in remission.  Cytogenetics negative for trisomy 12.  Negative myeloma FISH panel.  11/13/20 [facility]  MRI cervical sine w/wo contrast - no evidence of metastatic disease C3-4 severe spinal stenosis, left neural foraminal narrowing. C4-5 spinal canal stenosis.   INTERVAL HISTORY Luis Burke is a 65 y.o. 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 #Patient is currently on ixizomab 3 mg on day 1, day 8, day 15 out of 28 days. Patient is starting next cycle of treatment today. No new complaints.  Review of Systems  Constitutional: Negative for appetite change, chills, fatigue, fever and unexpected weight change.  HENT:   Negative for hearing loss and voice change.   Eyes: Negative for eye problems and icterus.  Respiratory: Negative for chest tightness, cough and shortness of breath.   Cardiovascular: Negative for chest pain and leg swelling.  Gastrointestinal: Negative for abdominal distention and abdominal pain.  Endocrine: Negative for hot flashes.  Genitourinary: Negative for difficulty urinating, dysuria  and frequency.   Musculoskeletal: Positive for back pain and neck pain. Negative for arthralgias.  Skin: Negative for itching and rash.  Neurological: Negative for light-headedness and numbness.  Hematological: Negative for adenopathy. Does not bruise/bleed easily.  Psychiatric/Behavioral: Negative for confusion.    MEDICAL HISTORY:  Past Medical History:  Diagnosis Date  . AKI (acute kidney injury) ([place]) 01/06/2019  . Anemia   . 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[place])    autologous stem cell bone marrow transplant on 04/23/2019.  Marland Kitchen Fatty liver    on u/s 07/2018  . Hyperlipidemia 03/2004  . Hypertension 1994  . Multiple myeloma ([place])   . Snores     SURGICAL HISTORY: Past Surgical History:  Procedure Laterality Date  . CATARACT EXTRACTION W/PHACO Right 03/04/2018   Procedure: CATARACT EXTRACTION PHACO AND INTRAOCULAR LENS PLACEMENT ([place])  RIGHT TORIC;  Surgeon: Leandrew Koyanagi, MD;  Location: [place];  Service: Ophthalmology;  Laterality: Right;  [place] no Toric Lens 1:45 5.3  . COLONOSCOPY WITH PROPOFOL N/A 07/20/2018   Procedure: COLONOSCOPY WITH PROPOFOL;  Surgeon: Jonathon Bellows, MD;  Location: [facility] ENDOSCOPY;  Service: Gastroenterology;  Laterality: N/A;  . ESOPHAGOGASTRODUODENOSCOPY (EGD) WITH PROPOFOL N/A 07/20/2018   Procedure: ESOPHAGOGASTRODUODENOSCOPY (EGD) WITH PROPOFOL;  Surgeon: Jonathon Bellows, MD;  Location: [facility] ENDOSCOPY;  Service: Gastroenterology;  Laterality: N/A;  . EYE SURGERY Right   . HERNIA REPAIR    . L Lap herniorraphy  04/2000  . Left wrist ganglionectomy      SOCIAL HISTORY: Social History   Socioeconomic History  .  Marital status: Single    Spouse name: Not on file  . Number of children: 1  . Years of education: Not on file  . Highest education level: Not on file  Occupational History  . Occupation: [employer]  Tobacco Use  . Smoking status: Never Smoker  . Smokeless tobacco: Never Used  . Tobacco comment: Occassionally  Vaping  Use  . Vaping Use: Never used  Substance and Sexual Activity  . Alcohol use: Not Currently    Alcohol/week: 3.0 standard drinks    Types: 3 Cans of beer per week  . Drug use: No  . Sexual activity: Not on file  Other Topics Concern  . 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 Hypertension Mother   . Diabetes Mother   . Heart disease Mother        CAD  . Dementia Mother   . Hypertension Father   . Heart disease Father        MI 02/03  . Colon cancer Father   . Hypertension Sister   . Hypertension Sister   . Hypertension Sister   . Prostate cancer Neg Hx     ALLERGIES:  is allergic to bactrim [sulfamethoxazole-trimethoprim], clonidine derivatives, and tadalafil.  MEDICATIONS:  Current Outpatient Medications  Medication Sig Dispense Refill  . acetaminophen (TYLENOL) 325 MG tablet Take 650 mg by mouth every 6 (six) hours as needed.    Marland Kitchen amLODipine (NORVASC) 5 MG tablet TAKE 1-2 TABLETS (5-10 MG TOTAL) BY MOUTH DAILY. (Patient taking differently: Take 10 mg by mouth daily.) 180 tablet 3  . B Complex Vitamins (VITAMIN B COMPLEX PO) SMARTSIG:By Mouth    . clopidogrel (PLAVIX) 75 MG tablet Take by mouth.    . co-enzyme Q-10 50 MG capsule Take 50 mg by mouth daily.    . cyanocobalamin ([employer] VITAMIN B12) 1000 MCG tablet Take 1 tablet (1,000 mcg total) by mouth daily. 90 tablet 3  . cyclobenzaprine (FLEXERIL) 5 MG tablet Take 1 tablet (5 mg total) by  mouth 3 (three) times daily as needed for muscle spasms. 30 tablet 0  . famotidine (PEPCID) 20 MG tablet Take 20 mg by mouth daily.     . hydrALAZINE (APRESOLINE) 10 MG tablet TAKE 1 TABLET (10 MG TOTAL) BY MOUTH 3 (THREE) TIMES DAILY. PLEASE MAKE AN APPOINTMENT FOR REFILLS. 270 tablet 1  . ixazomib citrate (NINLARO) 3 MG capsule Take 1 capsule (3 mg) by mouth weekly, 3 weeks on, 1 week off, repeat every 4 weeks. Take on an empty stomach 1hr before or 2hr after meals. 3 capsule 3  . lisinopril (ZESTRIL) 20 MG tablet TAKE 1 TABLET BY MOUTH EVERY DAY. PLEASE MAKE APPOINTMENT FOR FURTHER REFILLS. 90 tablet 0  . LORazepam (ATIVAN) 1 MG tablet TAKE 1 TABLET (1 MG TOTAL) BY MOUTH EVERY 6 (SIX) HOURS AS NEEDED FOR ANXIETY (FOR NAUSEA)    .  magnesium chloride (SLOW-MAG) 64 MG TBEC SR tablet Take 1 tablet (64 mg total) by mouth daily. 60 tablet 0  . metoprolol succinate (TOPROL-XL) 50 MG 24 hr tablet TAKE 4 TABLETS (200 MG TOTAL) BY MOUTH DAILY. TAKE WITH OR IMMEDIATELY FOLLOWING A MEAL. 360 tablet 0  . ondansetron (ZOFRAN) 8 MG tablet Take 8 mg by mouth every 8 (eight) hours as needed. for nausea    . pravastatin (PRAVACHOL) 10 MG tablet TAKE 1 TABLET BY MOUTH EVERY DAY 90 tablet 3  . prochlorperazine (COMPAZINE) 10 MG tablet TAKE 1 TABLET (10 MG TOTAL) BY MOUTH EVERY 6 (SIX) HOURS AS NEEDED FOR NAUSEA    . zinc gluconate 50 MG tablet Take 50 mg by mouth daily.     No current facility-administered medications for this visit.     PHYSICAL EXAMINATION: ECOG PERFORMANCE STATUS: 1 - Symptomatic but completely ambulatory Vitals:   11/16/20 1054  BP: 128/86  Pulse: (!) 58  Resp: 16  Temp: (!) 96.9 F (36.1 C)   Filed Weights   11/16/20 1054  Weight: 208 lb 9.6 oz (94.6 kg)    Physical Exam Constitutional:      General: He is not in acute distress. HENT:     Head: Normocephalic and atraumatic.  Eyes:     General: No scleral icterus. Cardiovascular:     Rate and Rhythm: Normal rate and  regular rhythm.     Heart sounds: Normal heart sounds.  Pulmonary:     Effort: Pulmonary effort is normal. No respiratory distress.     Breath sounds: No wheezing.  Abdominal:     General: Bowel sounds are normal. There is no distension.     Palpations: Abdomen is soft.  Musculoskeletal:        General: No deformity. Normal range of motion.     Cervical back: Normal range of motion and neck supple.  Skin:    General: Skin is warm and dry.     Findings: No erythema or rash.  Neurological:     Mental Status: He is alert and oriented to person, place, and time. Mental status is at baseline.     Cranial Nerves: No cranial nerve deficit.     Coordination: Coordination normal.  Psychiatric:        Mood and Affect: Mood normal.      LABORATORY DATA:  I have reviewed the data as listed Lab Results  Component Value Date   WBC 5.7 11/16/2020   HGB 12.5 (L) 11/16/2020   HCT 36.1 (L) 11/16/2020   MCV 97.0 11/16/2020   PLT 92 (L) 11/16/2020   Recent Labs    04/27/20 1016 05/30/20 0944 06/22/20 0847 07/20/20 0844 09/14/20 0850 10/12/20 1031 11/16/20 1008  NA 138 138 139   < > 139 138 138  K 4.0 4.4 4.5   < > 3.9 4.1 3.9  CL 103 104 103   < > 101 103 105  CO2 $Re'26 26 26   'pol$ < > $R'27 28 25  'fB$ GLUCOSE 103* 102* 104*   < > 95 103* 103*  BUN 23 20 24*   < > $R'22 20 20  'cu$ CREATININE 1.41* 1.29* 1.55*   < > 1.38* 1.22 1.24  CALCIUM 9.6 9.2 9.8   < > 9.6 9.4 9.2  GFRNONAA 52* 58* 47*   < > 57* >60 >60  GFRAA >60 >60 54*  --   --   --   --   PROT 7.8 7.4 7.6   < >  7.4 7.7 7.6  ALBUMIN 4.7 4.4 4.3   < > 4.6 4.6 4.3  AST $Re'24 23 24   'mvz$ < > $R'21 23 23  'Ks$ ALT $'24 24 24   'd$ < > $R'24 27 19  'tF$ ALKPHOS 62 66 59   < > 59 59 66  BILITOT 0.9 0.8 0.8   < > 0.9 0.7 0.7   < > = values in this interval not displayed.   Iron/TIBC/Ferritin/ %Sat    Component Value Date/Time   IRON 74 07/01/2018 0944   TIBC 250 07/01/2018 0944   FERRITIN 357 07/01/2018 0944   IRONPCTSAT 30 07/01/2018 0944    Lab Results  Component  Value Date   TOTALPROTELP 7.2 10/12/2020   ALBUMINELP 3.7 03/10/2019   A1GS 0.3 03/10/2019   A2GS 0.8 03/10/2019   BETS 1.3 03/10/2019   GAMS 0.6 03/10/2019   MSPIKE Not Observed 03/10/2019   SPEI Comment 03/10/2019   Lab Results  Component Value Date   KPAFRELGTCHN 36.0 (H) 10/12/2020   LAMBDASER 14.1 10/12/2020   KAPLAMBRATIO 2.55 (H) 10/12/2020    RADIOGRAPHIC STUDIES: I have personally reviewed the radiological images as listed and agreed with the findings in the report. VAS US CAROTID  Result Date: 11/09/2020 Carotid Arterial Duplex Study Indications:       2 year follow-up. Other Factors:     Patient denies all cerebrovascular symptoms. Comparison Study:  09/13/19 carotid duplex exam showed RICA vleocities of                    833/82NK/N and LICA velocities of 39/76BH/A. A velocity of                    216/30 cm/s was reported at the proximal right CCA Performing Technologist: Pilar Jarvis RDMS, RVT, RDCS  Examination Guidelines: A complete evaluation includes B-mode imaging, spectral Doppler, color Doppler, and power Doppler as needed of all accessible portions of each vessel. Bilateral testing is considered an integral part of a complete examination. Limited examinations for reoccurring indications may be performed as noted.  Right Carotid Findings: +----------+--------+--------+---------+------------------+--------+           PSV cm/sEDV cm/sStenosis Plaque DescriptionComments +----------+--------+--------+---------+------------------+--------+ CCA Prox  104     20      No plaque                           +----------+--------+--------+---------+------------------+--------+ CCA Distal72      24                                          +----------+--------+--------+---------+------------------+--------+ ICA Prox  43      13      Normal                              +----------+--------+--------+---------+------------------+--------+ ICA Mid   57      24       Normal                              +----------+--------+--------+---------+------------------+--------+ ICA Distal68      26                                          +----------+--------+--------+---------+------------------+--------+  ECA       85      22                                          +----------+--------+--------+---------+------------------+--------+ +----------+--------+-------+----------------+-------------------+           PSV cm/sEDV cmsDescribe        Arm Pressure (mmHG) +----------+--------+-------+----------------+-------------------+ [id number]            Multiphasic, [id number]                 +----------+--------+-------+----------------+-------------------+ +---------+--------+--+--------+--+---------+ VertebralPSV cm/s37EDV cm/s12Antegrade +---------+--------+--+--------+--+---------+  Left Carotid Findings: +----------+--------+--------+--------+------------------+--------------------+           PSV cm/sEDV cm/sStenosisPlaque DescriptionComments             +----------+--------+--------+--------+------------------+--------------------+ CCA Prox  104     24                                                     +----------+--------+--------+--------+------------------+--------------------+ CCA Distal72      18                                                     +----------+--------+--------+--------+------------------+--------------------+ ICA Prox  71      20      Normal                    Minimal focal plaque +----------+--------+--------+--------+------------------+--------------------+ ICA Mid   60      25      Normal                                         +----------+--------+--------+--------+------------------+--------------------+ ICA Distal61      23                                                     +----------+--------+--------+--------+------------------+--------------------+ ECA       72      19                                                      +----------+--------+--------+--------+------------------+--------------------+ +----------+--------+--------+----------------+-------------------+           PSV cm/sEDV cm/sDescribe        Arm Pressure (mmHG) +----------+--------+--------+----------------+-------------------+ Subclavian70      10      Multiphasic, [id number]                 +----------+--------+--------+----------------+-------------------+ +---------+--------+--+--------+--+---------+ VertebralPSV cm/s44EDV cm/s15Antegrade +---------+--------+--+--------+--+---------+   Summary: Right Carotid: There was no evidence of thrombus, dissection, atherosclerotic                plaque or stenosis in the cervical carotid system. Left Carotid: The extracranial vessels  were near-normal with only minimal wall               thickening or plaque. Vertebrals:  Bilateral vertebral arteries demonstrate antegrade flow. Subclavians: Normal flow hemodynamics were seen in bilateral subclavian              arteries. *See table(s) above for measurements and observations.  Electronically signed by Jenkins Rouge MD on 11/09/2020 at 11:44:33 AM.    Final       ASSESSMENT & PLAN:  1. Multiple myeloma in remission ([place])   2. Encounter for antineoplastic chemotherapy   3. Bone lesion    #Light chain multiple myeloma beta 2 microglobulin 5 and normal albumin.  Stage II. cytogenetics showed normal male chromosome, MDS FISH panel showed trisomy 68- Standard Risk. S/p RVD x 9 and  Autologous bone marrow stem cell transplant at [facility] on 04/23/2019. Labs are reviewed and discussed with patient. Light chain ratio continues to improved. No M protein. Proceed with this cycle of Ixazomib.   #Diarrhea, improved. # Lumbar lesion,  [facility] MRI results showed no metastatic spine lesion. He has another MRI scheduled  Chronic back pain and neck pain, images are consistent with chronic degenerative disease.   Patient continues to follow-up with [facility]opedic surgeon.  Thrombocytopenia, count is 92, improved.  chronic kidney disease, creatinine is stable.  Encourage oral hydration.  Post bone marrow transplant care, He has completed 1 year of acyclovir.  Off acyclovir now. Post transplant vaccination at [facility].   #Bone health/osteopenia/multiple myeloma-  Continue Xgeva monthly for 2 years, Delton See today. Recommend calcium and vitamin D supplementation. Follow up 4 weeks for evaluation prior to starting next cycle of chemotherapy for maintenance.  Earlie Server, MD, PhD Hematology Oncology [place] at [facility] 11/16/2020

## 2020-11-16 NOTE — Progress Notes (Signed)
Patient denies new problems/concerns today.   °

## 2020-11-17 ENCOUNTER — Encounter: Payer: Self-pay | Admitting: Oncology

## 2020-11-17 LAB — KAPPA/LAMBDA LIGHT CHAINS
Kappa free light chain: 40.1 mg/L — ABNORMAL HIGH (ref 3.3–19.4)
Kappa, lambda light chain ratio: 2.45 — ABNORMAL HIGH (ref 0.26–1.65)
Lambda free light chains: 16.4 mg/L (ref 5.7–26.3)

## 2020-11-18 LAB — MULTIPLE MYELOMA PANEL, SERUM
Albumin SerPl Elph-Mcnc: 4 g/dL (ref 2.9–4.4)
Albumin/Glob SerPl: 1.5 (ref 0.7–1.7)
Alpha 1: 0.2 g/dL (ref 0.0–0.4)
Alpha2 Glob SerPl Elph-Mcnc: 0.6 g/dL (ref 0.4–1.0)
B-Globulin SerPl Elph-Mcnc: 0.9 g/dL (ref 0.7–1.3)
Gamma Glob SerPl Elph-Mcnc: 1 g/dL (ref 0.4–1.8)
Globulin, Total: 2.8 g/dL (ref 2.2–3.9)
IgA: 180 mg/dL (ref 61–437)
IgG (Immunoglobin G), Serum: 1098 mg/dL (ref 603–1613)
IgM (Immunoglobulin M), Srm: 25 mg/dL (ref 20–172)
Total Protein ELP: 6.8 g/dL (ref 6.0–8.5)

## 2020-11-21 ENCOUNTER — Other Ambulatory Visit: Payer: Self-pay | Admitting: *Deleted

## 2020-11-21 DIAGNOSIS — C9000 Multiple myeloma not having achieved remission: Secondary | ICD-10-CM

## 2020-11-21 MED ORDER — IXAZOMIB CITRATE 3 MG PO CAPS: ORAL_CAPSULE | ORAL | 3 refills | Status: DC

## 2020-11-22 ENCOUNTER — Other Ambulatory Visit: Payer: Self-pay | Admitting: Family Medicine

## 2020-11-23 DIAGNOSIS — M542 Cervicalgia: Secondary | ICD-10-CM | POA: Diagnosis not present

## 2020-12-14 ENCOUNTER — Inpatient Hospital Stay (HOSPITAL_BASED_OUTPATIENT_CLINIC_OR_DEPARTMENT_OTHER): Payer: BC Managed Care – PPO | Admitting: Oncology

## 2020-12-14 ENCOUNTER — Inpatient Hospital Stay: Payer: BC Managed Care – PPO | Attending: Oncology

## 2020-12-14 ENCOUNTER — Encounter: Payer: Self-pay | Admitting: Oncology

## 2020-12-14 ENCOUNTER — Inpatient Hospital Stay: Payer: BC Managed Care – PPO

## 2020-12-14 VITALS — BP 148/89 | HR 55 | Temp 97.0°F | Wt 208.1 lb

## 2020-12-14 DIAGNOSIS — D696 Thrombocytopenia, unspecified: Secondary | ICD-10-CM | POA: Diagnosis not present

## 2020-12-14 DIAGNOSIS — C9001 Multiple myeloma in remission: Secondary | ICD-10-CM | POA: Insufficient documentation

## 2020-12-14 DIAGNOSIS — C9000 Multiple myeloma not having achieved remission: Secondary | ICD-10-CM

## 2020-12-14 DIAGNOSIS — N179 Acute kidney failure, unspecified: Secondary | ICD-10-CM

## 2020-12-14 DIAGNOSIS — Z5111 Encounter for antineoplastic chemotherapy: Secondary | ICD-10-CM | POA: Diagnosis not present

## 2020-12-14 LAB — CBC WITH DIFFERENTIAL/PLATELET
Abs Immature Granulocytes: 0.02 10*3/uL (ref 0.00–0.07)
Basophils Absolute: 0 10*3/uL (ref 0.0–0.1)
Basophils Relative: 0 %
Eosinophils Absolute: 0.1 10*3/uL (ref 0.0–0.5)
Eosinophils Relative: 1 %
HCT: 37.3 % — ABNORMAL LOW (ref 39.0–52.0)
Hemoglobin: 12.6 g/dL — ABNORMAL LOW (ref 13.0–17.0)
Immature Granulocytes: 0 %
Lymphocytes Relative: 29 %
Lymphs Abs: 1.5 10*3/uL (ref 0.7–4.0)
MCH: 33.7 pg (ref 26.0–34.0)
MCHC: 33.8 g/dL (ref 30.0–36.0)
MCV: 99.7 fL (ref 80.0–100.0)
Monocytes Absolute: 0.5 10*3/uL (ref 0.1–1.0)
Monocytes Relative: 10 %
Neutro Abs: 3.1 10*3/uL (ref 1.7–7.7)
Neutrophils Relative %: 60 %
Platelets: 70 10*3/uL — ABNORMAL LOW (ref 150–400)
RBC: 3.74 MIL/uL — ABNORMAL LOW (ref 4.22–5.81)
RDW: 13.2 % (ref 11.5–15.5)
WBC: 5.1 10*3/uL (ref 4.0–10.5)
nRBC: 0 % (ref 0.0–0.2)

## 2020-12-14 LAB — COMPREHENSIVE METABOLIC PANEL
ALT: 24 U/L (ref 0–44)
AST: 22 U/L (ref 15–41)
Albumin: 4.6 g/dL (ref 3.5–5.0)
Alkaline Phosphatase: 65 U/L (ref 38–126)
Anion gap: 11 (ref 5–15)
BUN: 22 mg/dL (ref 8–23)
CO2: 25 mmol/L (ref 22–32)
Calcium: 9.5 mg/dL (ref 8.9–10.3)
Chloride: 104 mmol/L (ref 98–111)
Creatinine, Ser: 1.22 mg/dL (ref 0.61–1.24)
GFR, Estimated: >60 mL/min (ref >60)
Glucose, Bld: 104 mg/dL — ABNORMAL HIGH (ref 70–99)
Potassium: 4.3 mmol/L (ref 3.5–5.1)
Sodium: 140 mmol/L (ref 135–145)
Total Bilirubin: 0.7 mg/dL (ref 0.3–1.2)
Total Protein: 7.4 g/dL (ref 6.5–8.1)

## 2020-12-14 MED ORDER — DENOSUMAB 120 MG/1.7ML ~~LOC~~ SOLN
120.0000 mg | Freq: Once | SUBCUTANEOUS | Status: AC
  Administered 2020-12-14: 120 mg via SUBCUTANEOUS
  Filled 2020-12-14: qty 1.7

## 2020-12-14 NOTE — Progress Notes (Signed)
Hematology/Oncology  Follow up note [facility] [phone] [phone]   Patient Care Team: Duncan, Graham S, MD as PCP - General (Family Medicine) Yu, Zhou, MD as Consulting Physician (Oncology) Sung, Anthony Derek, MD as Referring Physician (Hematology and Oncology) Choi, Taewoong, MD as Referring Physician (Hematology and Oncology)  REASON FOR VISIT Follow up for multiple myeloma.   HISTORY OF PRESENTING ILLNESS:  Luis Burke is a  65 y.o.  male with PMH listed below presents for follow up of multiple myeloma.  # 07/27/2018 multiple myeloma panel showed M protein of 0.1, IgG 662, IgA 49, IgM 7. Patient was called back to further labs done. 08/10/2018, free light chain ratio showed extremely high level of kappa free light chain 10,183, with a kappa lambda light chain ratio of 1414.31 LDH 164 Beta-2 microglobulin 5 Patient was called and discuss about results.  He was recommended to undergo bone marrow biopsy and PET scan. 08/19/2018 bone marrow biopsy showed hypercellular marrow 80%, involved by plasma cell neoplasm up to 95%.  Consistent with plasma cell myeloma. Myeloma FISH  gain of gain of CEP12  09/10/2018 skeletal survey showed questionable small lucent lesions within the midshaft of the humerus bilaterally.  Otherwise no suspicious focal lytic lesion or acute bone abnormality. 08/25/2018 PET scan showed no definite hypermetabolic bone disease but CT findings are highly suspicious for numerous small myelomatous lesions involving the spine, sternum and scattered ribs.  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 He received preparative regimen with melphalan 200 mg/m on 04/22/2019 followed by autologous stem cell infusion on 04/23/2019. Currently he is transfusion independent.  Transfusion criteria with as needed hemoglobin less than 7.5 for platelet less than 10,000.  Transplant course was complicated with  febrile neutropenia grade 3, treated with vancomycin and cephapirin until engraftment on 05/06/2019. Patient also had engraftment syndrome and had to be started on Solu-Medrol 25 mg twice daily on 05/05/2019.  Transition to Medrol Dosepak on 05/07/2019 to complete steroid taper as outpatient.  Patient is currently on acyclovir for 1 year after transplant, dose was reduced on 722 11/20/2018 due to renal function. Patient had a baseline CKD with creatinine running between 1.4-1.6.  He had acute on chronic kidney injury and creatinine went up to 2.2 on 722.  Fluid hydration was given and creatinine decreased to baseline 1.5-1.7. His Hickman catheter was removed.  Candida Cruris treated with topical clotrimazole 1% 3 times daily and to resolution..  #  seen by Dr. Choi [facility] team on 06/30/2019.revaccination at [facility] begins at 12 months post transplant I discussed with [facility] hematology Dr.Choi and he recommends plans as listed below.  -patient to be started on Ixazomib maintenance: -Ixazomib 3mg on D1/D8/D15 out of 28 day cycle.  -patient will start re-vaccination 12 months post transplant.  -Post transplant bone marrow biopsy if clinically indicated.  # seen by [facility] bone marrow transplant team in June 2021.  Patient has been started on immunization protocol.  He reports no new complaints.  #07/12/2020 CT skeletal survey done at [facility] 1. Lucent lesion in the L4 spinous process measuring 4 mm which is  nonspecific. Consider confirmation of marrow replacing process with MRI.  2. Incompletely characterized renal cysts   # He continues to have chronic back pain and neck pain.  Had MRI spine at the [facility]  08/05/2020, MRI lumbar spine with and without contrast showed no evidence of spinal metastasis.  Lumbar spondylosis is most pronounced at L5-S1   where there is severe right and moderate left neural foraminal stenosis. 08/11/2020, x-ray cervical spine complete with flexion and extension showed The cervical spine  is visualized from C1 to the top of T1 on the lateral  view.  Reversal of the normal cervical lordosis with a focal kyphosis centered at the C5-C6 intervertebral level. Trace anterolisthesis of C4 onto C5. Trace retrolisthesis of C6 on C7. No evidence ofdynamic instability is noted on  the flexion or extension views.   No prevertebral soft tissue swelling. Cervical vertebral body heights are maintained. Multilevel degenerative changes of the cervical spine including discogenic disease, most pronounced at C5-C6 and C6-C7, and facet arthropathy most pronounced at C4-C5. Multilevel mild to moderate left neural foraminal osseous narrowing of the mid to lower cervical vertebrae, possibly centrally/artifactual by patient positioning.  #07/28/2020 bone marrow normocellular marrow with polytypic plasmacytosis, 3 to 8%   Patient is in remission.  Cytogenetics negative for trisomy 12.  Negative myeloma FISH panel.   # Lumbar lesion,  11/13/20 [facility]  MRI cervical sine w/wo contrast - no evidence of metastatic disease C3-4 severe spinal stenosis, left neural foraminal narrowing. C4-5 spinal canal stenosis. [facility] MRI results showed no metastatic spine lesion. Chronic back pain and neck pain, images are consistent with chronic degenerative disease.  Patient continues to follow-up with [place] orthopedic surgeon.   INTERVAL HISTORY Luis Burke is a 65 y.o. 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 #Patient is currently on ixizomab 3 mg on day 1, day 8, day 15 out of 28 days. Patient is starting next cycle of treatment today. He reports feeling well.  No new complaints.  Review of Systems  Constitutional: Negative for appetite change, chills, fatigue, fever and unexpected weight change.  HENT:   Negative for hearing loss and voice change.   Eyes: Negative for eye problems and icterus.  Respiratory: Negative for chest tightness, cough and  shortness of breath.   Cardiovascular: Negative for chest pain and leg swelling.  Gastrointestinal: Negative for abdominal distention and abdominal pain.  Endocrine: Negative for hot flashes.  Genitourinary: Negative for difficulty urinating, dysuria and frequency.   Musculoskeletal: Positive for back pain. Negative for arthralgias and neck pain.  Skin: Negative for itching and rash.  Neurological: Negative for light-headedness and numbness.  Hematological: Negative for adenopathy. Does not bruise/bleed easily.  Psychiatric/Behavioral: Negative for confusion.    MEDICAL HISTORY:  Past Medical History:  Diagnosis Date  . AKI (acute kidney injury) ([place]) 01/06/2019  . Anemia   . 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[place])    autologous stem cell bone marrow transplant on 04/23/2019.  Marland Kitchen Fatty liver    on u/s 07/2018  . Hyperlipidemia 03/2004  . Hypertension 1994  . Multiple myeloma ([place])   . Snores     SURGICAL HISTORY: Past Surgical History:  Procedure Laterality Date  . CATARACT EXTRACTION W/PHACO Right 03/04/2018   Procedure: CATARACT EXTRACTION PHACO AND INTRAOCULAR LENS PLACEMENT ([place])  RIGHT TORIC;  Surgeon: Leandrew Koyanagi, MD;  Location: [facility];  Service: Ophthalmology;  Laterality: Right;  [place] no Toric Lens 1:45 5.3  . COLONOSCOPY WITH PROPOFOL N/A 07/20/2018   Procedure: COLONOSCOPY WITH PROPOFOL;  Surgeon: Jonathon Bellows, MD;  Location: [facility] ENDOSCOPY;  Service: Gastroenterology;  Laterality: N/A;  . ESOPHAGOGASTRODUODENOSCOPY (EGD) WITH PROPOFOL N/A 07/20/2018   Procedure: ESOPHAGOGASTRODUODENOSCOPY (EGD) WITH PROPOFOL;  Surgeon: Jonathon Bellows, MD;  Location: [facility] ENDOSCOPY;  Service: Gastroenterology;  Laterality: N/A;  . EYE SURGERY Right   . HERNIA REPAIR    . L Lap herniorraphy  04/2000  . Left wrist ganglionectomy      SOCIAL HISTORY: Social History   Socioeconomic History  . Marital status: Single    Spouse name: Not on file  . Number of children: 1   . Years of education: Not on file  . Highest education level: Not on file  Occupational History  . Occupation: [employer]  Tobacco Use  . Smoking status: Never Smoker  . Smokeless tobacco: Never Used  . Tobacco comment: Occassionally  Vaping Use  . Vaping Use: Never used  Substance and Sexual Activity  . Alcohol use: Not Currently    Alcohol/week: 3.0 standard drinks    Types: 3 Cans of beer per week  . Drug use: No  . Sexual activity: Not on file  Other Topics Concern  . 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 Hypertension Mother   . Diabetes Mother   . Heart disease Mother        CAD  . Dementia Mother   . Hypertension Father   . Heart disease Father        MI 02/03  . Colon cancer Father   . Hypertension Sister   . Hypertension Sister   . Hypertension Sister   . Prostate cancer Neg Hx     ALLERGIES:  is allergic to bactrim [sulfamethoxazole-trimethoprim], clonidine derivatives, and tadalafil.  MEDICATIONS:  Current Outpatient Medications  Medication Sig Dispense Refill  . acetaminophen (TYLENOL) 325 MG tablet Take 650 mg by mouth every 6 (six) hours as needed.    Marland Kitchen amLODipine (NORVASC) 5 MG tablet Take 2 tablets (10 mg total) by mouth daily. 90 tablet 3  . B Complex Vitamins (VITAMIN B COMPLEX PO) SMARTSIG:By Mouth    . clopidogrel (PLAVIX) 75 MG tablet Take by mouth.    . co-enzyme Q-10 50 MG capsule Take 50 mg by mouth daily.     . cyanocobalamin ([employer] VITAMIN B12) 1000 MCG tablet Take 1 tablet (1,000 mcg total) by mouth daily. 90 tablet 3  . cyclobenzaprine (FLEXERIL) 5 MG tablet Take 1 tablet (5 mg total) by mouth 3 (three) times daily as needed for muscle spasms. 30 tablet 0  . famotidine (PEPCID) 20 MG tablet Take 20 mg by mouth daily.     . hydrALAZINE (APRESOLINE) 10 MG tablet TAKE 1 TABLET (10 MG TOTAL) BY MOUTH 3 (THREE) TIMES DAILY. PLEASE MAKE AN APPOINTMENT FOR REFILLS. 270 tablet 1  . ixazomib citrate (NINLARO) 3 MG capsule Take 1 capsule (3 mg) by mouth weekly, 3 weeks on, 1 week off, repeat every 4 weeks. Take on an empty stomach 1hr before or 2hr after meals. 3 capsule 3  . lisinopril (ZESTRIL) 20 MG tablet TAKE 1 TABLET BY MOUTH EVERY DAY. PLEASE MAKE APPOINTMENT FOR FURTHER REFILLS.  90 tablet 0  . LORazepam (ATIVAN) 1 MG tablet TAKE 1 TABLET (1 MG TOTAL) BY MOUTH EVERY 6 (SIX) HOURS AS NEEDED FOR ANXIETY (FOR NAUSEA)    . magnesium chloride (SLOW-MAG) 64 MG TBEC SR tablet Take 1 tablet (64 mg total) by mouth daily. 60 tablet 0  . metoprolol succinate (TOPROL-XL) 50 MG 24 hr tablet TAKE 4 TABLETS (200 MG TOTAL) BY MOUTH DAILY. TAKE WITH OR IMMEDIATELY FOLLOWING A MEAL. 360 tablet 0  . ondansetron (ZOFRAN) 8 MG tablet Take 8 mg by mouth every 8 (eight) hours as needed. for nausea    . pravastatin (PRAVACHOL) 10 MG tablet TAKE 1 TABLET BY MOUTH EVERY DAY 90 tablet 3  . prochlorperazine (COMPAZINE) 10 MG tablet TAKE 1 TABLET (10 MG TOTAL) BY MOUTH EVERY 6 (SIX) HOURS AS NEEDED FOR NAUSEA    . zinc gluconate 50 MG tablet Take 50 mg by mouth daily.     No current facility-administered medications for this visit.     PHYSICAL EXAMINATION: ECOG PERFORMANCE STATUS: 1 - Symptomatic but completely ambulatory Vitals:   12/14/20 1010  BP: (!) 148/89  Pulse: (!) 55  Temp: (!) 97 F (36.1 C)  SpO2: 98%   Filed Weights   12/14/20 1010  Weight: 208 lb 1.6 oz (94.4 kg)    Physical Exam Constitutional:       General: He is not in acute distress. HENT:     Head: Normocephalic and atraumatic.  Eyes:     General: No scleral icterus. Cardiovascular:     Rate and Rhythm: Normal rate and regular rhythm.     Heart sounds: Normal heart sounds.  Pulmonary:     Effort: Pulmonary effort is normal. No respiratory distress.     Breath sounds: No wheezing.  Abdominal:     General: Bowel sounds are normal. There is no distension.     Palpations: Abdomen is soft.  Musculoskeletal:        General: No deformity. Normal range of motion.     Cervical back: Normal range of motion and neck supple.  Skin:    General: Skin is warm and dry.     Findings: No erythema or rash.  Neurological:     Mental Status: He is alert and oriented to person, place, and time. Mental status is at baseline.     Cranial Nerves: No cranial nerve deficit.     Coordination: Coordination normal.  Psychiatric:        Mood and Affect: Mood normal.      LABORATORY DATA:  I have reviewed the data as listed Lab Results  Component Value Date   WBC 5.1 12/14/2020   HGB 12.6 (L) 12/14/2020   HCT 37.3 (L) 12/14/2020   MCV 99.7 12/14/2020   PLT 70 (L) 12/14/2020   Recent Labs    04/27/20 1016 05/30/20 0944 06/22/20 0847 07/20/20 0844 10/12/20 1031 11/16/20 1008 12/14/20 0952  NA 138 138 139   < > 138 138 140  K 4.0 4.4 4.5   < > 4.1 3.9 4.3  CL 103 104 103   < > 103 105 104  CO2 _0 < > _1 GLUCOSE 103* 102* 104*   < > 103* 103* 104*  BUN 23 20 24*   < > _2 CREATININE 1.41* 1.29* 1.55*   < > 1.22 1.24 1.22  CALCIUM 9.6 9.2 9.8   < > 9.4 9.2 9.5  GFRNONAA 52*  58* 47*   < > >60 >60 >60  GFRAA >60 >60 54*  --   --   --   --   PROT 7.8 7.4 7.6   < > 7.7 7.6 7.4  ALBUMIN 4.7 4.4 4.3   < > 4.6 4.3 4.6  AST _0 < > _1 ALT _2 < > _3 ALKPHOS 62 66 59   < > 59 66 65  BILITOT 0.9 0.8 0.8   < > 0.7 0.7 0.7   < > = values in this interval not displayed.   Iron/TIBC/Ferritin/  %Sat    Component Value Date/Time   IRON 74 07/01/2018 0944   TIBC 250 07/01/2018 0944   FERRITIN 357 07/01/2018 0944   IRONPCTSAT 30 07/01/2018 0944    Lab Results  Component Value Date   TOTALPROTELP 6.8 11/16/2020   ALBUMINELP 3.7 03/10/2019   A1GS 0.3 03/10/2019   A2GS 0.8 03/10/2019   BETS 1.3 03/10/2019   GAMS 0.6 03/10/2019   MSPIKE Not Observed 03/10/2019   SPEI Comment 03/10/2019   Lab Results  Component Value Date   KPAFRELGTCHN 40.1 (H) 11/16/2020   LAMBDASER 16.4 11/16/2020   KAPLAMBRATIO 2.45 (H) 11/16/2020    RADIOGRAPHIC STUDIES: I have personally reviewed the radiological images as listed and agreed with the findings in the report. VAS US CAROTID  Result Date: 11/09/2020 Carotid Arterial Duplex Study Indications:       2 year follow-up. Other Factors:     Patient denies all cerebrovascular symptoms. Comparison Study:  09/13/19 carotid duplex exam showed RICA vleocities of                    902/11DB/Z and LICA velocities of 20/80EM/V. A velocity of                    216/30 cm/s was reported at the proximal right CCA Performing Technologist: Pilar Jarvis RDMS, RVT, RDCS  Examination Guidelines: A complete evaluation includes B-mode imaging, spectral Doppler, color Doppler, and power Doppler as needed of all accessible portions of each vessel. Bilateral testing is considered an integral part of a complete examination. Limited examinations for reoccurring indications may be performed as noted.  Right Carotid Findings: +----------+--------+--------+---------+------------------+--------+           PSV cm/sEDV cm/sStenosis Plaque DescriptionComments +----------+--------+--------+---------+------------------+--------+ CCA Prox  104     20      No plaque                           +----------+--------+--------+---------+------------------+--------+ CCA Distal72      24                                           +----------+--------+--------+---------+------------------+--------+ ICA Prox  43      13      Normal                              +----------+--------+--------+---------+------------------+--------+ ICA Mid   57      24      Normal                              +----------+--------+--------+---------+------------------+--------+  ICA Distal68      26                                          +----------+--------+--------+---------+------------------+--------+ ECA       85      22                                          +----------+--------+--------+---------+------------------+--------+ +----------+--------+-------+----------------+-------------------+           PSV cm/sEDV cmsDescribe        Arm Pressure (mmHG) +----------+--------+-------+----------------+-------------------+ [id number]            Multiphasic, [id number]                 +----------+--------+-------+----------------+-------------------+ +---------+--------+--+--------+--+---------+ VertebralPSV cm/s37EDV cm/s12Antegrade +---------+--------+--+--------+--+---------+  Left Carotid Findings: +----------+--------+--------+--------+------------------+--------------------+           PSV cm/sEDV cm/sStenosisPlaque DescriptionComments             +----------+--------+--------+--------+------------------+--------------------+ CCA Prox  104     24                                                     +----------+--------+--------+--------+------------------+--------------------+ CCA Distal72      18                                                     +----------+--------+--------+--------+------------------+--------------------+ ICA Prox  71      20      Normal                    Minimal focal plaque +----------+--------+--------+--------+------------------+--------------------+ ICA Mid   60      25      Normal                                          +----------+--------+--------+--------+------------------+--------------------+ ICA Distal61      23                                                     +----------+--------+--------+--------+------------------+--------------------+ ECA       72      19                                                     +----------+--------+--------+--------+------------------+--------------------+ +----------+--------+--------+----------------+-------------------+           PSV cm/sEDV cm/sDescribe        Arm Pressure (mmHG) +----------+--------+--------+----------------+-------------------+ Subclavian70      10      Multiphasic, [id number]                 +----------+--------+--------+----------------+-------------------+ +---------+--------+--+--------+--+---------+  VertebralPSV cm/s44EDV cm/s15Antegrade +---------+--------+--+--------+--+---------+   Summary: Right Carotid: There was no evidence of thrombus, dissection, atherosclerotic                plaque or stenosis in the cervical carotid system. Left Carotid: The extracranial vessels were near-normal with only minimal wall               thickening or plaque. Vertebrals:  Bilateral vertebral arteries demonstrate antegrade flow. Subclavians: Normal flow hemodynamics were seen in bilateral subclavian              arteries. *See table(s) above for measurements and observations.  Electronically signed by Jenkins Rouge MD on 11/09/2020 at 11:44:33 AM.    Final       ASSESSMENT & PLAN:  1. Multiple myeloma in remission ([place])   2. Encounter for antineoplastic chemotherapy   3. Thrombocytopenia ([place])    #Light chain multiple myeloma beta 2 microglobulin 5 and normal albumin.  Stage II. cytogenetics showed normal male chromosome, MDS FISH panel showed trisomy 57- Standard Risk. S/p RVD x 9 and  Autologous bone marrow stem cell transplant at [facility] on 04/23/2019. Labs are reviewed and are discussed with patient. Light chain ratio is stable.   Today's ratio level is pending. Proceed with this cycle of Ixazomib.    Thrombocytopenia, count is 70, slightly lower than his baseline.  Continue to monitor.  Repeat CBC in 2 weeks chronic kidney disease, creatinine is normal and stable.  Encourage oral hydration.  Avoid nephrotoxin.  Post bone marrow transplant care, He has completed 1 year of acyclovir.  Off acyclovir now. Post transplant vaccination at [facility].   #Bone health/osteopenia/multiple myeloma-  Continue Xgeva monthly for up to 2 years, proceed with Xgeva today. Continue calcium and vitamin D supplementation. Follow up 4 weeks for evaluation prior to starting next cycle of chemotherapy for maintenance.  Earlie Server, MD, PhD Hematology Oncology [place] at [facility] 12/14/2020

## 2020-12-15 LAB — KAPPA/LAMBDA LIGHT CHAINS
Kappa free light chain: 42.6 mg/L — ABNORMAL HIGH (ref 3.3–19.4)
Kappa, lambda light chain ratio: 2.73 — ABNORMAL HIGH (ref 0.26–1.65)
Lambda free light chains: 15.6 mg/L (ref 5.7–26.3)

## 2020-12-22 ENCOUNTER — Other Ambulatory Visit: Payer: Self-pay | Admitting: Family Medicine

## 2020-12-27 ENCOUNTER — Other Ambulatory Visit: Payer: Self-pay

## 2020-12-27 DIAGNOSIS — C9001 Multiple myeloma in remission: Secondary | ICD-10-CM

## 2020-12-28 ENCOUNTER — Inpatient Hospital Stay: Payer: BC Managed Care – PPO

## 2020-12-28 DIAGNOSIS — C9001 Multiple myeloma in remission: Secondary | ICD-10-CM | POA: Diagnosis not present

## 2020-12-28 LAB — CBC WITH DIFFERENTIAL/PLATELET
Abs Immature Granulocytes: 0.02 10*3/uL (ref 0.00–0.07)
Basophils Absolute: 0 10*3/uL (ref 0.0–0.1)
Basophils Relative: 0 %
Eosinophils Absolute: 0.1 10*3/uL (ref 0.0–0.5)
Eosinophils Relative: 1 %
HCT: 38 % — ABNORMAL LOW (ref 39.0–52.0)
Hemoglobin: 12.8 g/dL — ABNORMAL LOW (ref 13.0–17.0)
Immature Granulocytes: 0 %
Lymphocytes Relative: 38 %
Lymphs Abs: 2.1 10*3/uL (ref 0.7–4.0)
MCH: 33.4 pg (ref 26.0–34.0)
MCHC: 33.7 g/dL (ref 30.0–36.0)
MCV: 99.2 fL (ref 80.0–100.0)
Monocytes Absolute: 0.5 10*3/uL (ref 0.1–1.0)
Monocytes Relative: 9 %
Neutro Abs: 2.8 10*3/uL (ref 1.7–7.7)
Neutrophils Relative %: 52 %
Platelets: 94 10*3/uL — ABNORMAL LOW (ref 150–400)
RBC: 3.83 MIL/uL — ABNORMAL LOW (ref 4.22–5.81)
RDW: 13.2 % (ref 11.5–15.5)
WBC: 5.5 10*3/uL (ref 4.0–10.5)
nRBC: 0 % (ref 0.0–0.2)

## 2021-01-09 ENCOUNTER — Other Ambulatory Visit: Payer: Self-pay | Admitting: Family Medicine

## 2021-01-11 ENCOUNTER — Inpatient Hospital Stay (HOSPITAL_BASED_OUTPATIENT_CLINIC_OR_DEPARTMENT_OTHER): Payer: BC Managed Care – PPO | Admitting: Oncology

## 2021-01-11 ENCOUNTER — Inpatient Hospital Stay: Payer: BC Managed Care – PPO

## 2021-01-11 ENCOUNTER — Encounter: Payer: Self-pay | Admitting: Oncology

## 2021-01-11 VITALS — BP 144/91 | HR 57 | Temp 97.3°F | Resp 18 | Wt 209.2 lb

## 2021-01-11 DIAGNOSIS — D696 Thrombocytopenia, unspecified: Secondary | ICD-10-CM

## 2021-01-11 DIAGNOSIS — C9001 Multiple myeloma in remission: Secondary | ICD-10-CM | POA: Diagnosis not present

## 2021-01-11 DIAGNOSIS — Z5111 Encounter for antineoplastic chemotherapy: Secondary | ICD-10-CM | POA: Diagnosis not present

## 2021-01-11 DIAGNOSIS — N1831 Chronic kidney disease, stage 3a: Secondary | ICD-10-CM | POA: Diagnosis not present

## 2021-01-11 DIAGNOSIS — N179 Acute kidney failure, unspecified: Secondary | ICD-10-CM

## 2021-01-11 DIAGNOSIS — C9000 Multiple myeloma not having achieved remission: Secondary | ICD-10-CM

## 2021-01-11 LAB — CBC WITH DIFFERENTIAL/PLATELET
Abs Immature Granulocytes: 0.01 10*3/uL (ref 0.00–0.07)
Basophils Absolute: 0 10*3/uL (ref 0.0–0.1)
Basophils Relative: 0 %
Eosinophils Absolute: 0.1 10*3/uL (ref 0.0–0.5)
Eosinophils Relative: 2 %
HCT: 39.2 % (ref 39.0–52.0)
Hemoglobin: 13.4 g/dL (ref 13.0–17.0)
Immature Granulocytes: 0 %
Lymphocytes Relative: 40 %
Lymphs Abs: 1.8 10*3/uL (ref 0.7–4.0)
MCH: 33.8 pg (ref 26.0–34.0)
MCHC: 34.2 g/dL (ref 30.0–36.0)
MCV: 99 fL (ref 80.0–100.0)
Monocytes Absolute: 0.5 10*3/uL (ref 0.1–1.0)
Monocytes Relative: 10 %
Neutro Abs: 2.2 10*3/uL (ref 1.7–7.7)
Neutrophils Relative %: 48 %
Platelets: 73 10*3/uL — ABNORMAL LOW (ref 150–400)
RBC: 3.96 MIL/uL — ABNORMAL LOW (ref 4.22–5.81)
RDW: 13.2 % (ref 11.5–15.5)
WBC: 4.5 10*3/uL (ref 4.0–10.5)
nRBC: 0 % (ref 0.0–0.2)

## 2021-01-11 LAB — COMPREHENSIVE METABOLIC PANEL
ALT: 25 U/L (ref 0–44)
AST: 24 U/L (ref 15–41)
Albumin: 4.6 g/dL (ref 3.5–5.0)
Alkaline Phosphatase: 59 U/L (ref 38–126)
Anion gap: 10 (ref 5–15)
BUN: 22 mg/dL (ref 8–23)
CO2: 25 mmol/L (ref 22–32)
Calcium: 9.3 mg/dL (ref 8.9–10.3)
Chloride: 104 mmol/L (ref 98–111)
Creatinine, Ser: 1.34 mg/dL — ABNORMAL HIGH (ref 0.61–1.24)
GFR, Estimated: 59 mL/min — ABNORMAL LOW (ref >60)
Glucose, Bld: 106 mg/dL — ABNORMAL HIGH (ref 70–99)
Potassium: 4.2 mmol/L (ref 3.5–5.1)
Sodium: 139 mmol/L (ref 135–145)
Total Bilirubin: 0.9 mg/dL (ref 0.3–1.2)
Total Protein: 7.6 g/dL (ref 6.5–8.1)

## 2021-01-11 MED ORDER — DENOSUMAB 120 MG/1.7ML ~~LOC~~ SOLN
120.0000 mg | Freq: Once | SUBCUTANEOUS | Status: AC
  Administered 2021-01-11: 120 mg via SUBCUTANEOUS
  Filled 2021-01-11: qty 1.7

## 2021-01-11 NOTE — Progress Notes (Signed)
Patient here for follow up. Pt would like to know if he can stop taking famotidine once he runs out. He will discuss with MD.

## 2021-01-11 NOTE — Progress Notes (Signed)
Hematology/Oncology  Follow up note [facility] [phone] [phone]   Patient Care Team: Duncan, Graham S, MD as PCP - General (Family Medicine) Yu, Zhou, MD as Consulting Physician (Oncology) Sung, Anthony Derek, MD as Referring Physician (Hematology and Oncology) Choi, Taewoong, MD as Referring Physician (Hematology and Oncology)  REASON FOR VISIT Follow up for multiple myeloma.   HISTORY OF PRESENTING ILLNESS:  Luis Burke is a  64 y.o.  male with PMH listed below presents for follow up of multiple myeloma.  # 07/27/2018 multiple myeloma panel showed M protein of 0.1, IgG 662, IgA 49, IgM 7. Patient was called back to further labs done. 08/10/2018, free light chain ratio showed extremely high level of kappa free light chain 10,183, with a kappa lambda light chain ratio of 1414.31 LDH 164 Beta-2 microglobulin 5 Patient was called and discuss about results.  He was recommended to undergo bone marrow biopsy and PET scan. 08/19/2018 bone marrow biopsy showed hypercellular marrow 80%, involved by plasma cell neoplasm up to 95%.  Consistent with plasma cell myeloma. Myeloma FISH  gain of gain of CEP12  09/10/2018 skeletal survey showed questionable small lucent lesions within the midshaft of the humerus bilaterally.  Otherwise no suspicious focal lytic lesion or acute bone abnormality. 08/25/2018 PET scan showed no definite hypermetabolic bone disease but CT findings are highly suspicious for numerous small myelomatous lesions involving the spine, sternum and scattered ribs.  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 He received preparative regimen with melphalan 200 mg/m on 04/22/2019 followed by autologous stem cell infusion on 04/23/2019. Currently he is transfusion independent.  Transfusion criteria with as needed hemoglobin less than 7.5 for platelet less than 10,000.  Transplant course was complicated with  febrile neutropenia grade 3, treated with vancomycin and cephapirin until engraftment on 05/06/2019. Patient also had engraftment syndrome and had to be started on Solu-Medrol 25 mg twice daily on 05/05/2019.  Transition to Medrol Dosepak on 05/07/2019 to complete steroid taper as outpatient.  Patient is currently on acyclovir for 1 year after transplant, dose was reduced on 722 11/20/2018 due to renal function. Patient had a baseline CKD with creatinine running between 1.4-1.6.  He had acute on chronic kidney injury and creatinine went up to 2.2 on 722.  Fluid hydration was given and creatinine decreased to baseline 1.5-1.7. His Hickman catheter was removed.  Candida Cruris treated with topical clotrimazole 1% 3 times daily and to resolution..  #  seen by Dr. Choi [facility] BMT team on 06/30/2019.revaccination at [facility] begins at 12 months post transplant I discussed with [facility] hematology Dr.Choi and he recommends plans as listed below.  -patient to be started on Ixazomib maintenance: -Ixazomib 3mg on D1/D8/D15 out of 28 day cycle.  -patient will start re-vaccination 12 months post transplant.  -Post transplant bone marrow biopsy if clinically indicated.  # seen by [facility] bone marrow transplant team in June 2021.  Patient has been started on immunization protocol.  He reports no new complaints.  #07/12/2020 CT skeletal survey done at [facility] 1. Lucent lesion in the L4 spinous process measuring 4 mm which is  nonspecific. Consider confirmation of marrow replacing process with MRI.  2. Incompletely characterized renal cysts   # He continues to have chronic back pain and neck pain.  Had MRI spine at the [facility]  08/05/2020, MRI lumbar spine with and without contrast showed no evidence of spinal metastasis.  Lumbar spondylosis is most pronounced at L5-S1   where there is severe right and moderate left neural foraminal stenosis. 08/11/2020, x-ray cervical spine complete with flexion and extension showed The cervical spine  is visualized from C1 to the top of T1 on the lateral  view.  Reversal of the normal cervical lordosis with a focal kyphosis centered at the C5-C6 intervertebral level. Trace anterolisthesis of C4 onto C5. Trace retrolisthesis of C6 on C7. No evidence ofdynamic instability is noted on  the flexion or extension views.   No prevertebral soft tissue swelling. Cervical vertebral body heights are maintained. Multilevel degenerative changes of the cervical spine including discogenic disease, most pronounced at C5-C6 and C6-C7, and facet arthropathy most pronounced at C4-C5. Multilevel mild to moderate left neural foraminal osseous narrowing of the mid to lower cervical vertebrae, possibly centrally/artifactual by patient positioning.  #07/28/2020 bone marrow normocellular marrow with polytypic plasmacytosis, 3 to 8%   Patient is in remission.  Cytogenetics negative for trisomy 12.  Negative myeloma FISH panel.   # Lumbar lesion,  11/13/20 [facility]  MRI cervical sine w/wo contrast - no evidence of metastatic disease C3-4 severe spinal stenosis, left neural foraminal narrowing. C4-5 spinal canal stenosis. [facility] MRI results showed no metastatic spine lesion. Chronic back pain and neck pain, images are consistent with chronic degenerative disease.  Patient continues to follow-up with [facility] orthopedic surgeon.   INTERVAL HISTORY Luis Burke is a 64 y.o. 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 #Patient is currently on ixizomab 3 mg on day 1, day 8, day 15 out of 28 days. He is starting next cycle today.  Feeling well, no new complaints.  Chronic back pain.   Review of Systems  Constitutional: Negative for appetite change, chills, fatigue, fever and unexpected weight change.  HENT:   Negative for hearing loss and voice change.   Eyes: Negative for eye problems and icterus.  Respiratory: Negative for chest tightness, cough and  shortness of breath.   Cardiovascular: Negative for chest pain and leg swelling.  Gastrointestinal: Negative for abdominal distention and abdominal pain.  Endocrine: Negative for hot flashes.  Genitourinary: Negative for difficulty urinating, dysuria and frequency.   Musculoskeletal: Positive for back pain. Negative for arthralgias and neck pain.  Skin: Negative for itching and rash.  Neurological: Negative for light-headedness and numbness.  Hematological: Negative for adenopathy. Does not bruise/bleed easily.  Psychiatric/Behavioral: Negative for confusion.    MEDICAL HISTORY:  Past Medical History:  Diagnosis Date  . AKI (acute kidney injury) (HCC) 01/06/2019  . Anemia   . 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HCC)    autologous stem cell bone marrow transplant on 04/23/2019.  . Fatty liver    on u/s 07/2018  . Hyperlipidemia 03/2004  . Hypertension 1994  . Multiple myeloma (HCC)   . Snores     SURGICAL HISTORY: Past Surgical History:  Procedure Laterality Date  . CATARACT EXTRACTION W/PHACO Right 03/04/2018   Procedure: CATARACT EXTRACTION PHACO AND INTRAOCULAR LENS PLACEMENT (IOC)  RIGHT TORIC;  Surgeon: Brasington, Chadwick, MD;  Location: [facility];  Service: Ophthalmology;  Laterality: Right;  Per [place] no Toric Lens 1:45 5.3  . COLONOSCOPY WITH PROPOFOL N/A 07/20/2018   Procedure: COLONOSCOPY WITH PROPOFOL;  Surgeon: Anna, Kiran, MD;  Location: [facility];  Service: Gastroenterology;  Laterality: N/A;  . ESOPHAGOGASTRODUODENOSCOPY (EGD) WITH PROPOFOL N/A 07/20/2018   Procedure: ESOPHAGOGASTRODUODENOSCOPY (EGD) WITH PROPOFOL;  Surgeon: Anna, Kiran, MD;  Location: [facility];  Service: Gastroenterology;    Laterality: N/A;  . EYE SURGERY Right   . HERNIA REPAIR    . L Lap herniorraphy  04/2000  . Left wrist ganglionectomy      SOCIAL HISTORY: Social History   Socioeconomic History  . Marital status: Single    Spouse name: Not on file  . Number of children: 1   . Years of education: Not on file  . Highest education level: Not on file  Occupational History  . Occupation: [employer]  Tobacco Use  . Smoking status: Never Smoker  . Smokeless tobacco: Never Used  . Tobacco comment: Occassionally  Vaping Use  . Vaping Use: Never used  Substance and Sexual Activity  . Alcohol use: Not Currently    Alcohol/week: 3.0 standard drinks    Types: 3 Cans of beer per week  . Drug use: No  . Sexual activity: Not on file  Other Topics Concern  . Not on file  Social History Narrative   Divorced 2009, dating as of 2019   His daughter died 4 days after giving birth to the patient's granddaughter   Has joint custody of his dead daughter's child   Works at [employer] [place]   Social Determinants of Health   Financial Resource Strain: Not on file  Food Insecurity: Not on file  Transportation Needs: Not on file  Physical Activity: Not on file  Stress: Not on file  Social Connections: Not on file  Intimate Partner Violence: Not on file    FAMILY HISTORY: Family History  Problem Relation Age of Onset  . Hypertension Mother   . Diabetes Mother   . Heart disease Mother        CAD  . Dementia Mother   . Hypertension Father   . Heart disease Father        MI 02/03  . Colon cancer Father   . Hypertension Sister   . Hypertension Sister   . Hypertension Sister   . Prostate cancer Neg Hx     ALLERGIES:  is allergic to bactrim [sulfamethoxazole-trimethoprim], clonidine derivatives, and tadalafil.  MEDICATIONS:  Current Outpatient Medications  Medication Sig Dispense Refill  . acetaminophen (TYLENOL) 325 MG tablet Take 650 mg by mouth every 6 (six) hours as needed.    . amLODipine (NORVASC) 5 MG tablet Take 2 tablets (10 mg total) by mouth daily. 90 tablet 3  . B Complex Vitamins (VITAMIN B COMPLEX PO) SMARTSIG:By Mouth    . clopidogrel (PLAVIX) 75 MG tablet Take by mouth.    . co-enzyme Q-10 50 MG capsule Take 50 mg by mouth daily.     . [employer] VITAMIN B12 1000 MCG tablet TAKE 1 TABLET BY MOUTH EVERY DAY 90 tablet 3  . cyclobenzaprine (FLEXERIL) 5 MG tablet Take 1 tablet (5 mg total) by mouth 3 (three) times daily as needed for muscle spasms. 30 tablet 0  . famotidine (PEPCID) 20 MG tablet Take 20 mg by mouth daily.     . hydrALAZINE (APRESOLINE) 10 MG tablet TAKE 1 TABLET (10 MG TOTAL) BY MOUTH 3 (THREE) TIMES DAILY. PLEASE MAKE AN APPOINTMENT FOR REFILLS. 270 tablet 1  . ixazomib citrate (NINLARO) 3 MG capsule Take 1 capsule (3 mg) by mouth weekly, 3 weeks on, 1 week off, repeat every 4 weeks. Take on an empty stomach 1hr before or 2hr after meals. 3 capsule 3  . lisinopril (ZESTRIL) 20 MG tablet TAKE 1 TABLET BY MOUTH EVERY DAY. PLEASE MAKE APPOINTMENT FOR FURTHER REFILLS. 90 tablet 0  .   LORazepam (ATIVAN) 1 MG tablet TAKE 1 TABLET (1 MG TOTAL) BY MOUTH EVERY 6 (SIX) HOURS AS NEEDED FOR ANXIETY (FOR NAUSEA)    . magnesium chloride (SLOW-MAG) 64 MG TBEC SR tablet Take 1 tablet (64 mg total) by mouth daily. 60 tablet 0  . metoprolol succinate (TOPROL-XL) 50 MG 24 hr tablet TAKE 4 TABLETS (200 MG TOTAL) BY MOUTH DAILY. TAKE WITH OR IMMEDIATELY FOLLOWING A MEAL. 360 tablet 0  . ondansetron (ZOFRAN) 8 MG tablet Take 8 mg by mouth every 8 (eight) hours as needed. for nausea    . pravastatin (PRAVACHOL) 10 MG tablet TAKE 1 TABLET BY MOUTH EVERY DAY 90 tablet 3  . prochlorperazine (COMPAZINE) 10 MG tablet TAKE 1 TABLET (10 MG TOTAL) BY MOUTH EVERY 6 (SIX) HOURS AS NEEDED FOR NAUSEA    . zinc gluconate 50 MG tablet Take 50 mg by mouth daily.     No current facility-administered medications for this visit.     PHYSICAL EXAMINATION: ECOG PERFORMANCE STATUS: 1 - Symptomatic but completely ambulatory Vitals:   01/11/21 1047  BP: (!) 144/91  Pulse: (!) 57  Resp: 18  Temp: (!) 97.3 F (36.3 C)   Filed Weights   01/11/21 1047  Weight: 209 lb 3.2 oz (94.9 kg)    Physical Exam Constitutional:      General: He is not in acute  distress. HENT:     Head: Normocephalic and atraumatic.  Eyes:     General: No scleral icterus. Cardiovascular:     Rate and Rhythm: Normal rate and regular rhythm.     Heart sounds: Normal heart sounds.  Pulmonary:     Effort: Pulmonary effort is normal. No respiratory distress.     Breath sounds: No wheezing.  Abdominal:     General: Bowel sounds are normal. There is no distension.     Palpations: Abdomen is soft.  Musculoskeletal:        General: No deformity. Normal range of motion.     Cervical back: Normal range of motion and neck supple.  Skin:    General: Skin is warm and dry.     Findings: No erythema or rash.  Neurological:     Mental Status: He is alert and oriented to person, place, and time. Mental status is at baseline.     Cranial Nerves: No cranial nerve deficit.     Coordination: Coordination normal.  Psychiatric:        Mood and Affect: Mood normal.      LABORATORY DATA:  I have reviewed the data as listed Lab Results  Component Value Date   WBC 4.5 01/11/2021   HGB 13.4 01/11/2021   HCT 39.2 01/11/2021   MCV 99.0 01/11/2021   PLT 73 (L) 01/11/2021   Recent Labs    04/27/20 1016 05/30/20 0944 06/22/20 0847 07/20/20 0844 11/16/20 1008 12/14/20 0952 01/11/21 1010  NA 138 138 139   < > 138 140 139  K 4.0 4.4 4.5   < > 3.9 4.3 4.2  CL 103 104 103   < > 105 104 104  CO2 _0 < > _1 GLUCOSE 103* 102* 104*   < > 103* 104* 106*  BUN 23 20 24*   < > _2 CREATININE 1.41* 1.29* 1.55*   < > 1.24 1.22 1.34*  CALCIUM 9.6 9.2 9.8   < > 9.2 9.5 9.3  GFRNONAA 52* 58* 47*   < > >60 >  60 59*  GFRAA >60 >60 54*  --   --   --   --   PROT 7.8 7.4 7.6   < > 7.6 7.4 7.6  ALBUMIN 4.7 4.4 4.3   < > 4.3 4.6 4.6  AST _0 < > _1 ALT _2 < > _3 ALKPHOS 62 66 59   < > 66 65 59  BILITOT 0.9 0.8 0.8   < > 0.7 0.7 0.9   < > = values in this interval not displayed.   Iron/TIBC/Ferritin/ %Sat    Component Value Date/Time    IRON 74 07/01/2018 0944   TIBC 250 07/01/2018 0944   FERRITIN 357 07/01/2018 0944   IRONPCTSAT 30 07/01/2018 0944    Lab Results  Component Value Date   TOTALPROTELP 6.8 11/16/2020   ALBUMINELP 3.7 03/10/2019   A1GS 0.3 03/10/2019   A2GS 0.8 03/10/2019   BETS 1.3 03/10/2019   GAMS 0.6 03/10/2019   MSPIKE Not Observed 03/10/2019   SPEI Comment 03/10/2019   Lab Results  Component Value Date   KPAFRELGTCHN 42.6 (H) 12/14/2020   LAMBDASER 15.6 12/14/2020   KAPLAMBRATIO 2.73 (H) 12/14/2020    RADIOGRAPHIC STUDIES: I have personally reviewed the radiological images as listed and agreed with the findings in the report. VAS US CAROTID  Result Date: 11/09/2020 Carotid Arterial Duplex Study Indications:       2 year follow-up. Other Factors:     Patient denies all cerebrovascular symptoms. Comparison Study:  09/13/19 carotid duplex exam showed RICA vleocities of                    492/01EO/F and LICA velocities of 12/19XJ/O. A velocity of                    216/30 cm/s was reported at the proximal right CCA Performing Technologist: Pilar Jarvis RDMS, RVT, RDCS  Examination Guidelines: A complete evaluation includes B-mode imaging, spectral Doppler, color Doppler, and power Doppler as needed of all accessible portions of each vessel. Bilateral testing is considered an integral part of a complete examination. Limited examinations for reoccurring indications may be performed as noted.  Right Carotid Findings: +----------+--------+--------+---------+------------------+--------+           PSV cm/sEDV cm/sStenosis Plaque DescriptionComments +----------+--------+--------+---------+------------------+--------+ CCA Prox  104     20      No plaque                           +----------+--------+--------+---------+------------------+--------+ CCA Distal72      24                                          +----------+--------+--------+---------+------------------+--------+ ICA Prox  43       13      Normal                              +----------+--------+--------+---------+------------------+--------+ ICA Mid   57      24      Normal                              +----------+--------+--------+---------+------------------+--------+ ICA Distal68  26                                          +----------+--------+--------+---------+------------------+--------+ ECA       85      22                                          +----------+--------+--------+---------+------------------+--------+ +----------+--------+-------+----------------+-------------------+           PSV cm/sEDV cmsDescribe        Arm Pressure (mmHG) +----------+--------+-------+----------------+-------------------+ Subclavian109            Multiphasic, WNL144                 +----------+--------+-------+----------------+-------------------+ +---------+--------+--+--------+--+---------+ VertebralPSV cm/s37EDV cm/s12Antegrade +---------+--------+--+--------+--+---------+  Left Carotid Findings: +----------+--------+--------+--------+------------------+--------------------+           PSV cm/sEDV cm/sStenosisPlaque DescriptionComments             +----------+--------+--------+--------+------------------+--------------------+ CCA Prox  104     24                                                     +----------+--------+--------+--------+------------------+--------------------+ CCA Distal72      18                                                     +----------+--------+--------+--------+------------------+--------------------+ ICA Prox  71      20      Normal                    Minimal focal plaque +----------+--------+--------+--------+------------------+--------------------+ ICA Mid   60      25      Normal                                         +----------+--------+--------+--------+------------------+--------------------+ ICA Distal61      23                                                      +----------+--------+--------+--------+------------------+--------------------+ ECA       72      19                                                     +----------+--------+--------+--------+------------------+--------------------+ +----------+--------+--------+----------------+-------------------+           PSV cm/sEDV cm/sDescribe        Arm Pressure (mmHG) +----------+--------+--------+----------------+-------------------+ Subclavian70      10      Multiphasic, WNL142                 +----------+--------+--------+----------------+-------------------+ +---------+--------+--+--------+--+---------+ VertebralPSV cm/s44EDV   cm/s15Antegrade +---------+--------+--+--------+--+---------+   Summary: Right Carotid: There was no evidence of thrombus, dissection, atherosclerotic                plaque or stenosis in the cervical carotid system. Left Carotid: The extracranial vessels were near-normal with only minimal wall               thickening or plaque. Vertebrals:  Bilateral vertebral arteries demonstrate antegrade flow. Subclavians: Normal flow hemodynamics were seen in bilateral subclavian              arteries. *See table(s) above for measurements and observations.  Electronically signed by Peter Nishan MD on 11/09/2020 at 11:44:33 AM.    Final       ASSESSMENT & PLAN:  1. Multiple myeloma in remission (HCC)   2. Encounter for antineoplastic chemotherapy   3. Thrombocytopenia (HCC)   4. Stage 3a chronic kidney disease (HCC)    #Light chain multiple myeloma beta 2 microglobulin 5 and normal albumin.  Stage II. cytogenetics showed normal male chromosome, MDS FISH panel showed trisomy 12- Standard Risk. S/p RVD x 9 and  Autologous bone marrow stem cell transplant at [facility] on 04/23/2019. Labs are reviewed and discussed with patient. 12/14/20 Light chain ratio 2.73, slightly increased. Monitor.  Labs are reviewed and are discussed with  patient. Proceed with this cycle of Ixazomib.    Thrombocytopenia, count is 73, slightly lower than his baseline.  chronic kidney disease, creatinine is slightly elevated recommend patient to increase oral hydration. .   Avoid nephrotoxin.  Post bone marrow transplant care, He has completed 1 year of acyclovir.  Off acyclovir now. Post transplant vaccination at [facility].   #Bone health/osteopenia/multiple myeloma-  Continue Xgeva monthly for up to 2 years, proceed with Xgeva today. Continue calcium and vitamin D supplementation. Follow up 4 weeks for evaluation prior to starting next cycle of chemotherapy for maintenance.  Zhou Yu, MD, PhD Hematology Oncology [facility] 01/11/2021   

## 2021-01-12 LAB — KAPPA/LAMBDA LIGHT CHAINS
Kappa free light chain: 48.5 mg/L — ABNORMAL HIGH (ref 3.3–19.4)
Kappa, lambda light chain ratio: 3.42 — ABNORMAL HIGH (ref 0.26–1.65)
Lambda free light chains: 14.2 mg/L (ref 5.7–26.3)

## 2021-01-15 LAB — PROTEIN ELECTROPHORESIS, SERUM
A/G Ratio: 1.4 (ref 0.7–1.7)
Albumin ELP: 4.3 g/dL (ref 2.9–4.4)
Alpha-1-Globulin: 0.2 g/dL (ref 0.0–0.4)
Alpha-2-Globulin: 0.6 g/dL (ref 0.4–1.0)
Beta Globulin: 1.2 g/dL (ref 0.7–1.3)
Gamma Globulin: 1 g/dL (ref 0.4–1.8)
Globulin, Total: 3.1 g/dL (ref 2.2–3.9)
Total Protein ELP: 7.4 g/dL (ref 6.0–8.5)

## 2021-01-19 ENCOUNTER — Other Ambulatory Visit: Payer: Self-pay | Admitting: Family Medicine

## 2021-01-31 ENCOUNTER — Telehealth: Payer: Self-pay

## 2021-01-31 ENCOUNTER — Encounter: Payer: Self-pay | Admitting: Oncology

## 2021-01-31 NOTE — Telephone Encounter (Signed)
Returned patient's call request. He states that he needs to have a filling due to a cavity and wants to know if he should proceed since he is on xgeva inj or if should wait until he completes injections in September. He does not want any Jaw probems.   Per MD, non-invasive dental treatments are ok. Just filling of cavity should be fine.  Patient notified.

## 2021-02-08 ENCOUNTER — Inpatient Hospital Stay: Payer: BC Managed Care – PPO | Attending: Oncology

## 2021-02-08 ENCOUNTER — Inpatient Hospital Stay: Payer: BC Managed Care – PPO

## 2021-02-08 ENCOUNTER — Inpatient Hospital Stay (HOSPITAL_BASED_OUTPATIENT_CLINIC_OR_DEPARTMENT_OTHER): Payer: BC Managed Care – PPO | Admitting: Oncology

## 2021-02-08 ENCOUNTER — Encounter: Payer: Self-pay | Admitting: Oncology

## 2021-02-08 VITALS — BP 140/89 | HR 61 | Temp 97.2°F | Resp 16 | Wt 207.7 lb

## 2021-02-08 DIAGNOSIS — C9001 Multiple myeloma in remission: Secondary | ICD-10-CM | POA: Diagnosis not present

## 2021-02-08 DIAGNOSIS — N1831 Chronic kidney disease, stage 3a: Secondary | ICD-10-CM

## 2021-02-08 DIAGNOSIS — D696 Thrombocytopenia, unspecified: Secondary | ICD-10-CM | POA: Diagnosis not present

## 2021-02-08 DIAGNOSIS — N179 Acute kidney failure, unspecified: Secondary | ICD-10-CM

## 2021-02-08 DIAGNOSIS — M8580 Other specified disorders of bone density and structure, unspecified site: Secondary | ICD-10-CM | POA: Diagnosis not present

## 2021-02-08 DIAGNOSIS — Z5111 Encounter for antineoplastic chemotherapy: Secondary | ICD-10-CM | POA: Diagnosis not present

## 2021-02-08 DIAGNOSIS — C9000 Multiple myeloma not having achieved remission: Secondary | ICD-10-CM

## 2021-02-08 LAB — COMPREHENSIVE METABOLIC PANEL
ALT: 24 U/L (ref 0–44)
AST: 23 U/L (ref 15–41)
Albumin: 4.7 g/dL (ref 3.5–5.0)
Alkaline Phosphatase: 65 U/L (ref 38–126)
Anion gap: 10 (ref 5–15)
BUN: 27 mg/dL — ABNORMAL HIGH (ref 8–23)
CO2: 24 mmol/L (ref 22–32)
Calcium: 9.7 mg/dL (ref 8.9–10.3)
Chloride: 105 mmol/L (ref 98–111)
Creatinine, Ser: 1.49 mg/dL — ABNORMAL HIGH (ref 0.61–1.24)
GFR, Estimated: 52 mL/min — ABNORMAL LOW (ref >60)
Glucose, Bld: 103 mg/dL — ABNORMAL HIGH (ref 70–99)
Potassium: 4.5 mmol/L (ref 3.5–5.1)
Sodium: 139 mmol/L (ref 135–145)
Total Bilirubin: 0.7 mg/dL (ref 0.3–1.2)
Total Protein: 7.7 g/dL (ref 6.5–8.1)

## 2021-02-08 LAB — CBC WITH DIFFERENTIAL/PLATELET
Abs Immature Granulocytes: 0.01 10*3/uL (ref 0.00–0.07)
Basophils Absolute: 0 10*3/uL (ref 0.0–0.1)
Basophils Relative: 0 %
Eosinophils Absolute: 0 10*3/uL (ref 0.0–0.5)
Eosinophils Relative: 1 %
HCT: 39.1 % (ref 39.0–52.0)
Hemoglobin: 13.5 g/dL (ref 13.0–17.0)
Immature Granulocytes: 0 %
Lymphocytes Relative: 24 %
Lymphs Abs: 1.5 10*3/uL (ref 0.7–4.0)
MCH: 34.1 pg — ABNORMAL HIGH (ref 26.0–34.0)
MCHC: 34.5 g/dL (ref 30.0–36.0)
MCV: 98.7 fL (ref 80.0–100.0)
Monocytes Absolute: 0.6 10*3/uL (ref 0.1–1.0)
Monocytes Relative: 9 %
Neutro Abs: 4.2 10*3/uL (ref 1.7–7.7)
Neutrophils Relative %: 66 %
Platelets: 80 10*3/uL — ABNORMAL LOW (ref 150–400)
RBC: 3.96 MIL/uL — ABNORMAL LOW (ref 4.22–5.81)
RDW: 13.2 % (ref 11.5–15.5)
WBC: 6.4 10*3/uL (ref 4.0–10.5)
nRBC: 0 % (ref 0.0–0.2)

## 2021-02-08 MED ORDER — DENOSUMAB 120 MG/1.7ML ~~LOC~~ SOLN
120.0000 mg | Freq: Once | SUBCUTANEOUS | Status: AC
  Administered 2021-02-08: 120 mg via SUBCUTANEOUS
  Filled 2021-02-08: qty 1.7

## 2021-02-08 NOTE — Progress Notes (Signed)
Patient had episodes of diarrhea at 2 am today.

## 2021-02-08 NOTE — Progress Notes (Signed)
Hematology/Oncology  Follow up note [facility] [phone] [phone]   Patient Care Team: Duncan, Graham S, MD as PCP - General (Family Medicine) Maryella Abood, MD as Consulting Physician (Oncology) Sung, Anthony Derek, MD as Referring Physician (Hematology and Oncology) Choi, Taewoong, MD as Referring Physician (Hematology and Oncology)  REASON FOR VISIT Follow up for multiple myeloma.   HISTORY OF PRESENTING ILLNESS:  Luis Burke is a  64 y.o.  male with PMH listed below presents for follow up of multiple myeloma.  # 07/27/2018 multiple myeloma panel showed M protein of 0.1, IgG 662, IgA 49, IgM 7. Patient was called back to further labs done. 08/10/2018, free light chain ratio showed extremely high level of kappa free light chain 10,183, with a kappa lambda light chain ratio of 1414.31 LDH 164 Beta-2 microglobulin 5 Patient was called and discuss about results.  He was recommended to undergo bone marrow biopsy and PET scan. 08/19/2018 bone marrow biopsy showed hypercellular marrow 80%, involved by plasma cell neoplasm up to 95%.  Consistent with plasma cell myeloma. Myeloma FISH  gain of gain of CEP12  09/10/2018 skeletal survey showed questionable small lucent lesions within the midshaft of the humerus bilaterally.  Otherwise no suspicious focal lytic lesion or acute bone abnormality. 08/25/2018 PET scan showed no definite hypermetabolic bone disease but CT findings are highly suspicious for numerous small myelomatous lesions involving the spine, sternum and scattered ribs.  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 He received preparative regimen with melphalan 200 mg/m on 04/22/2019 followed by autologous stem cell infusion on 04/23/2019. Currently he is transfusion independent.  Transfusion criteria with as needed hemoglobin less than 7.5 for platelet less than 10,000.  Transplant course was complicated with  febrile neutropenia grade 3, treated with vancomycin and cephapirin until engraftment on 05/06/2019. Patient also had engraftment syndrome and had to be started on Solu-Medrol 25 mg twice daily on 05/05/2019.  Transition to Medrol Dosepak on 05/07/2019 to complete steroid taper as outpatient.  Patient is currently on acyclovir for 1 year after transplant, dose was reduced on 722 11/20/2018 due to renal function. Patient had a baseline CKD with creatinine running between 1.4-1.6.  He had acute on chronic kidney injury and creatinine went up to 2.2 on 722.  Fluid hydration was given and creatinine decreased to baseline 1.5-1.7. His Hickman catheter was removed.  Candida Cruris treated with topical clotrimazole 1% 3 times daily and to resolution..  #  seen by Dr. Choi [facility] team on 06/30/2019.revaccination at [facility] begins at 12 months post transplant I discussed with [facility] hematology Dr.Choi and he recommends plans as listed below.  -patient to be started on Ixazomib maintenance: -Ixazomib 3mg on D1/D8/D15 out of 28 day cycle.  -patient will start re-vaccination 12 months post transplant.  -Post transplant bone marrow biopsy if clinically indicated.  # seen by [facility] bone marrow transplant team in June 2021.  Patient has been started on immunization protocol.  He reports no new complaints.  #07/12/2020 CT skeletal survey done at [facility] 1. Lucent lesion in the L4 spinous process measuring 4 mm which is  nonspecific. Consider confirmation of marrow replacing process with MRI.  2. Incompletely characterized renal cysts   # He continues to have chronic back pain and neck pain.  Had MRI spine at the [facility]  08/05/2020, MRI lumbar spine with and without contrast showed no evidence of spinal metastasis.  Lumbar spondylosis is most pronounced at L5-S1   where there is severe right and moderate left neural foraminal stenosis. 08/11/2020, x-ray cervical spine complete with flexion and extension showed The cervical spine  is visualized from C1 to the top of T1 on the lateral  view.  Reversal of the normal cervical lordosis with a focal kyphosis centered at the C5-C6 intervertebral level. Trace anterolisthesis of C4 onto C5. Trace retrolisthesis of C6 on C7. No evidence ofdynamic instability is noted on  the flexion or extension views.   No prevertebral soft tissue swelling. Cervical vertebral body heights are maintained. Multilevel degenerative changes of the cervical spine including discogenic disease, most pronounced at C5-C6 and C6-C7, and facet arthropathy most pronounced at C4-C5. Multilevel mild to moderate left neural foraminal osseous narrowing of the mid to lower cervical vertebrae, possibly centrally/artifactual by patient positioning.  #07/28/2020 bone marrow normocellular marrow with polytypic plasmacytosis, 3 to 8%   Patient is in remission.  Cytogenetics negative for trisomy 12.  Negative myeloma FISH panel.   # Lumbar lesion,  11/13/20 [facility]  MRI cervical sine w/wo contrast - no evidence of metastatic disease C3-4 severe spinal stenosis, left neural foraminal narrowing. C4-5 spinal canal stenosis. [facility] MRI results showed no metastatic spine lesion. Chronic back pain and neck pain, images are consistent with chronic degenerative disease.  Patient continues to follow-up with [place] orthopedic surgeon.   INTERVAL HISTORY Luis Burke is a 65 y.o. 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 #Patient is currently on ixizomab 3 mg on day 1, day 8, day 15 out of 28 days. He is starting next cycle today.  Reports one episode of loose stool this morning.  Otherwise doing well.  No new complaints.  Chronic back pain, unchanged.  Review of Systems  Constitutional: Negative for appetite change, chills, fatigue, fever and unexpected weight change.  HENT:   Negative for hearing loss and voice change.   Eyes: Negative for eye problems and  icterus.  Respiratory: Negative for chest tightness, cough and shortness of breath.   Cardiovascular: Negative for chest pain and leg swelling.  Gastrointestinal: Negative for abdominal distention and abdominal pain.  Endocrine: Negative for hot flashes.  Genitourinary: Negative for difficulty urinating, dysuria and frequency.   Musculoskeletal: Positive for back pain. Negative for arthralgias and neck pain.  Skin: Negative for itching and rash.  Neurological: Negative for light-headedness and numbness.  Hematological: Negative for adenopathy. Does not bruise/bleed easily.  Psychiatric/Behavioral: Negative for confusion.    MEDICAL HISTORY:  Past Medical History:  Diagnosis Date  . AKI (acute kidney injury) ([place]) 01/06/2019  . Anemia   . 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[place])    autologous stem cell bone marrow transplant on 04/23/2019.  Marland Kitchen Fatty liver    on u/s 07/2018  . Hyperlipidemia 03/2004  . Hypertension 1994  . Multiple myeloma ([place])   . Snores     SURGICAL HISTORY: Past Surgical History:  Procedure Laterality Date  . CATARACT EXTRACTION W/PHACO Right 03/04/2018   Procedure: CATARACT EXTRACTION PHACO AND INTRAOCULAR LENS PLACEMENT ([place])  RIGHT TORIC;  Surgeon: Leandrew Koyanagi, MD;  Location: [facility];  Service: Ophthalmology;  Laterality: Right;  [place] no Toric Lens 1:45 5.3  . COLONOSCOPY WITH PROPOFOL N/A 07/20/2018   Procedure: COLONOSCOPY WITH PROPOFOL;  Surgeon: Jonathon Bellows, MD;  Location: [facility] ENDOSCOPY;  Service: Gastroenterology;  Laterality: N/A;  . ESOPHAGOGASTRODUODENOSCOPY (EGD) WITH PROPOFOL N/A 07/20/2018   Procedure: ESOPHAGOGASTRODUODENOSCOPY (EGD) WITH PROPOFOL;  Surgeon: Jonathon Bellows, MD;  Location: [facility] ENDOSCOPY;  Service: Gastroenterology;  Laterality: N/A;  . EYE SURGERY Right   . HERNIA REPAIR    . L Lap herniorraphy  04/2000  . Left wrist ganglionectomy      SOCIAL HISTORY: Social History   Socioeconomic History  . Marital  status: Single    Spouse name: Not on file  . Number of children: 1  . Years of education: Not on file  . Highest education level: Not on file  Occupational History  . Occupation: [employer]  Tobacco Use  . Smoking status: Never Smoker  . Smokeless tobacco: Never Used  . Tobacco comment: Occassionally  Vaping Use  . Vaping Use: Never used  Substance and Sexual Activity  . Alcohol use: Not Currently    Alcohol/week: 3.0 standard drinks    Types: 3 Cans of beer per week  . Drug use: No  . Sexual activity: Not on file  Other Topics Concern  . 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 Hypertension Mother   . Diabetes Mother   . Heart disease Mother        CAD  . Dementia Mother   . Hypertension Father   . Heart disease Father        MI 02/03  . Colon cancer Father   . Hypertension Sister   . Hypertension Sister   . Hypertension Sister   . Prostate cancer Neg Hx     ALLERGIES:  is allergic to bactrim [sulfamethoxazole-trimethoprim], clonidine derivatives, and tadalafil.  MEDICATIONS:  Current Outpatient Medications  Medication Sig Dispense Refill  . acetaminophen (TYLENOL) 325 MG tablet Take 650 mg by mouth every 6 (six) hours as needed.    Marland Kitchen amLODipine (NORVASC) 5 MG tablet Take 2 tablets (10 mg total) by mouth daily. 90 tablet 3  . B Complex Vitamins (VITAMIN B COMPLEX PO) SMARTSIG:By Mouth    . clopidogrel (PLAVIX) 75 MG tablet Take  by mouth.    . co-enzyme Q-10 50 MG capsule Take 50 mg by mouth daily.    . [facility] VITAMIN B12 1000 MCG tablet TAKE 1 TABLET BY MOUTH EVERY DAY 90 tablet 3  . cyclobenzaprine (FLEXERIL) 5 MG tablet Take 1 tablet (5 mg total) by mouth 3 (three) times daily as needed for muscle spasms. 30 tablet 0  . famotidine (PEPCID) 20 MG tablet Take 20 mg by mouth every other day.    . hydrALAZINE (APRESOLINE) 10 MG tablet TAKE 1 TABLET (10 MG TOTAL) BY MOUTH 3 (THREE) TIMES DAILY. PLEASE MAKE AN APPOINTMENT FOR REFILLS. 270 tablet 1  . ixazomib citrate (NINLARO) 3 MG capsule Take 1 capsule (3 mg) by mouth weekly, 3 weeks on, 1 week off, repeat every 4 weeks. Take on an empty stomach 1hr before or 2hr after meals. 3 capsule 3  . lisinopril (ZESTRIL) 20 MG tablet TAKE 1 TABLET  BY MOUTH EVERY DAY. PLEASE MAKE APPOINTMENT FOR FURTHER REFILLS. 90 tablet 0  . LORazepam (ATIVAN) 1 MG tablet TAKE 1 TABLET (1 MG TOTAL) BY MOUTH EVERY 6 (SIX) HOURS AS NEEDED FOR ANXIETY (FOR NAUSEA)    . magnesium chloride (SLOW-MAG) 64 MG TBEC SR tablet Take 1 tablet (64 mg total) by mouth daily. 60 tablet 0  . metoprolol succinate (TOPROL-XL) 50 MG 24 hr tablet TAKE 4 TABLETS (200 MG TOTAL) BY MOUTH DAILY. TAKE WITH OR IMMEDIATELY FOLLOWING A MEAL. 360 tablet 2  . ondansetron (ZOFRAN) 8 MG tablet Take 8 mg by mouth every 8 (eight) hours as needed. for nausea    . pravastatin (PRAVACHOL) 10 MG tablet TAKE 1 TABLET BY MOUTH EVERY DAY 90 tablet 3  . prochlorperazine (COMPAZINE) 10 MG tablet TAKE 1 TABLET (10 MG TOTAL) BY MOUTH EVERY 6 (SIX) HOURS AS NEEDED FOR NAUSEA    . zinc gluconate 50 MG tablet Take 50 mg by mouth daily.     No current facility-administered medications for this visit.     PHYSICAL EXAMINATION: ECOG PERFORMANCE STATUS: 1 - Symptomatic but completely ambulatory Vitals:   02/08/21 1040  BP: 140/89  Pulse: 61  Resp: 16  Temp: (!) 97.2 F (36.2 C)   Filed Weights   02/08/21 1040  Weight: 207 lb 11.2 oz (94.2  kg)    Physical Exam Constitutional:      General: He is not in acute distress. HENT:     Head: Normocephalic and atraumatic.  Eyes:     General: No scleral icterus. Cardiovascular:     Rate and Rhythm: Normal rate and regular rhythm.     Heart sounds: Normal heart sounds.  Pulmonary:     Effort: Pulmonary effort is normal. No respiratory distress.     Breath sounds: No wheezing.  Abdominal:     General: Bowel sounds are normal. There is no distension.     Palpations: Abdomen is soft.  Musculoskeletal:        General: No deformity. Normal range of motion.     Cervical back: Normal range of motion and neck supple.  Skin:    General: Skin is warm and dry.     Findings: No erythema or rash.  Neurological:     Mental Status: He is alert and oriented to person, place, and time. Mental status is at baseline.     Cranial Nerves: No cranial nerve deficit.     Coordination: Coordination normal.  Psychiatric:        Mood and Affect: Mood normal.      LABORATORY DATA:  I have reviewed the data as listed Lab Results  Component Value Date   WBC 6.4 02/08/2021   HGB 13.5 02/08/2021   HCT 39.1 02/08/2021   MCV 98.7 02/08/2021   PLT 80 (L) 02/08/2021   Recent Labs    04/27/20 1016 05/30/20 0944 06/22/20 0847 07/20/20 0844 12/14/20 0952 01/11/21 1010 02/08/21 1023  NA 138 138 139   < > 140 139 139  K 4.0 4.4 4.5   < > 4.3 4.2 4.5  CL 103 104 103   < > 104 104 105  CO2 $Re'26 26 26   'Cun$ < > $R'25 25 24  'GP$ GLUCOSE 103* 102* 104*   < > 104* 106* 103*  BUN 23 20 24*   < > 22 22 27*  CREATININE 1.41* 1.29* 1.55*   < > 1.22 1.34* 1.49*  CALCIUM 9.6 9.2 9.8   < >  9.5 9.3 9.7  GFRNONAA 52* 58* 47*   < > >60 59* 52*  GFRAA >60 >60 54*  --   --   --   --   PROT 7.8 7.4 7.6   < > 7.4 7.6 7.7  ALBUMIN 4.7 4.4 4.3   < > 4.6 4.6 4.7  AST $Re'24 23 24   'XGk$ < > $R'22 24 23  'Tb$ ALT $'24 24 24   'o$ < > $R'24 25 24  'Zl$ ALKPHOS 62 66 59   < > 65 59 65  BILITOT 0.9 0.8 0.8   < > 0.7 0.9 0.7   < > = values in this  interval not displayed.   Iron/TIBC/Ferritin/ %Sat    Component Value Date/Time   IRON 74 07/01/2018 0944   TIBC 250 07/01/2018 0944   FERRITIN 357 07/01/2018 0944   IRONPCTSAT 30 07/01/2018 0944    Lab Results  Component Value Date   TOTALPROTELP 7.4 01/11/2021   ALBUMINELP 4.3 01/11/2021   A1GS 0.2 01/11/2021   A2GS 0.6 01/11/2021   BETS 1.2 01/11/2021   GAMS 1.0 01/11/2021   MSPIKE Not Observed 01/11/2021   SPEI Comment 01/11/2021   Lab Results  Component Value Date   KPAFRELGTCHN 48.5 (H) 01/11/2021   LAMBDASER 14.2 01/11/2021   KAPLAMBRATIO 3.42 (H) 01/11/2021    RADIOGRAPHIC STUDIES: I have personally reviewed the radiological images as listed and agreed with the findings in the report. No results found.    ASSESSMENT & PLAN:  1. Multiple myeloma in remission ([place])   2. Encounter for antineoplastic chemotherapy   3. Thrombocytopenia ([facility])   4. Stage 3a chronic kidney disease ([place])    #Light chain multiple myeloma beta 2 microglobulin 5 and normal albumin.  Stage II. cytogenetics showed normal male chromosome, MDS FISH panel showed trisomy 83- Standard Risk. S/p RVD x 9 and  Autologous bone marrow stem cell transplant at [facility] on 04/23/2019. 01/11/21 Light chain ratio further increased to 3.42. Labs reviewed and discussed with patient. Proceed with current cycle of Ixazomib.  I discussed about the possibility of bone marrow biopsy if light chain ratio continues to progressively increase.  Thrombocytopenia, count is 80 , slightly lower than his baseline.  chronic kidney disease, creatinine is slightly elevated recommend patient to increase oral hydration. Marland Kitchen   Avoid nephrotoxin.  Post bone marrow transplant care, He has completed 1 year of acyclovir.  Off acyclovir now. Post transplant vaccination at [facility].   #Bone health/osteopenia/multiple myeloma-  Continue Xgeva monthly for up to 2 years, proceed with Xgeva today.  Patient has had a recent cavity feeling and  temporary crown restoration Continue calcium and vitamin D supplementation. Follow up 4 weeks for evaluation prior to starting next cycle of chemotherapy for maintenance.  Earlie Server, MD, PhD Hematology Oncology [place] [facility] 02/08/2021

## 2021-02-09 LAB — KAPPA/LAMBDA LIGHT CHAINS
Kappa free light chain: 47.8 mg/L — ABNORMAL HIGH (ref 3.3–19.4)
Kappa, lambda light chain ratio: 3.1 — ABNORMAL HIGH (ref 0.26–1.65)
Lambda free light chains: 15.4 mg/L (ref 5.7–26.3)

## 2021-02-14 ENCOUNTER — Other Ambulatory Visit (HOSPITAL_COMMUNITY): Payer: Self-pay

## 2021-02-23 ENCOUNTER — Encounter: Payer: Self-pay | Admitting: Oncology

## 2021-02-23 ENCOUNTER — Other Ambulatory Visit: Payer: Self-pay | Admitting: *Deleted

## 2021-02-23 MED ORDER — ONDANSETRON HCL 8 MG PO TABS: 8.0000 mg | ORAL_TABLET | Freq: Three times a day (TID) | ORAL | 1 refills | Status: DC | PRN

## 2021-02-27 ENCOUNTER — Telehealth: Payer: Self-pay

## 2021-02-27 MED ORDER — HYDRALAZINE HCL 10 MG PO TABS: ORAL_TABLET | ORAL | 1 refills | Status: DC

## 2021-02-27 NOTE — Telephone Encounter (Signed)
Pt came in office requesting refill for hydralazine.[facility] ([address] in [place]).[facility] said to contact doctor to get pharmacy changed over.[facility] but [facility] was out of refills and told him to contact doctor. Pt said normally they would refill it but he was out.Pt said he is leaving out of town tonight at 8pm and need medication. Pt said to call him when it is done [phone].

## 2021-02-27 NOTE — Telephone Encounter (Signed)
Pt left v/m that he needs his refill done today and request cb when done. Sending note to Dr Damita Dunnings and Janett Billow CMA.

## 2021-02-27 NOTE — Telephone Encounter (Signed)
Pharmacy requests refill on: Hydralazine 10 mg   LAST REFILL: 08/21/2020 (Q-270, R-0) LAST OV: 08/10/2020 NEXT OV: Not Scheduled  PHARMACY: [place], [place]

## 2021-02-27 NOTE — Telephone Encounter (Signed)
Sent, please update patient. Thanks.  Needs yearly visit set up for this fall.

## 2021-02-27 NOTE — Telephone Encounter (Signed)
[place] Night - Client Nonclinical Telephone Record AccessNurse Client [place] Night - Client Client Site [facility] Physician Renford Dills - MD Contact Type Call Who Is Calling Patient / Member / Family / Caregiver Caller Name Mavis Fichera Caller Phone Number [phone] Patient Name Luis Burke Patient DOB 11/19/1955 Call Type Message Only Information Provided Reason for Call Medication Question / Request Initial Comment Caller states he needs a rx refill, he also changed his pharmacy to [facility]. Additional Comment Caller declined triage, provided office hours to caller. Disp. Time Disposition Final User 02/27/2021 7:03:40 AM General Information Provided Yes Mockler, Tiffany Call Closed By: Marlou Sa Transaction Date/Time: 02/27/2021 7:01:28 AM (ET)

## 2021-02-27 NOTE — Telephone Encounter (Signed)
Patient notified rx was sent and CPE scheduled for 07/05/21 at 8:30 am

## 2021-03-08 ENCOUNTER — Encounter: Payer: Self-pay | Admitting: Oncology

## 2021-03-08 ENCOUNTER — Inpatient Hospital Stay: Payer: Medicare Other | Attending: Oncology

## 2021-03-08 ENCOUNTER — Inpatient Hospital Stay: Payer: Medicare Other

## 2021-03-08 ENCOUNTER — Inpatient Hospital Stay (HOSPITAL_BASED_OUTPATIENT_CLINIC_OR_DEPARTMENT_OTHER): Payer: Medicare Other | Admitting: Oncology

## 2021-03-08 ENCOUNTER — Inpatient Hospital Stay: Payer: Medicare Other | Admitting: Oncology

## 2021-03-08 ENCOUNTER — Other Ambulatory Visit: Payer: Self-pay

## 2021-03-08 VITALS — BP 141/86 | HR 62 | Temp 97.8°F | Resp 18 | Wt 210.4 lb

## 2021-03-08 DIAGNOSIS — C9001 Multiple myeloma in remission: Secondary | ICD-10-CM

## 2021-03-08 DIAGNOSIS — Z79899 Other long term (current) drug therapy: Secondary | ICD-10-CM | POA: Insufficient documentation

## 2021-03-08 DIAGNOSIS — D696 Thrombocytopenia, unspecified: Secondary | ICD-10-CM | POA: Diagnosis not present

## 2021-03-08 DIAGNOSIS — I10 Essential (primary) hypertension: Secondary | ICD-10-CM | POA: Insufficient documentation

## 2021-03-08 DIAGNOSIS — Z5111 Encounter for antineoplastic chemotherapy: Secondary | ICD-10-CM

## 2021-03-08 DIAGNOSIS — M8580 Other specified disorders of bone density and structure, unspecified site: Secondary | ICD-10-CM | POA: Insufficient documentation

## 2021-03-08 DIAGNOSIS — N1831 Chronic kidney disease, stage 3a: Secondary | ICD-10-CM | POA: Diagnosis not present

## 2021-03-08 DIAGNOSIS — E785 Hyperlipidemia, unspecified: Secondary | ICD-10-CM | POA: Diagnosis not present

## 2021-03-08 DIAGNOSIS — Z9481 Bone marrow transplant status: Secondary | ICD-10-CM | POA: Insufficient documentation

## 2021-03-08 DIAGNOSIS — N179 Acute kidney failure, unspecified: Secondary | ICD-10-CM

## 2021-03-08 LAB — CBC WITH DIFFERENTIAL/PLATELET
Abs Immature Granulocytes: 0.02 10*3/uL (ref 0.00–0.07)
Basophils Absolute: 0 10*3/uL (ref 0.0–0.1)
Basophils Relative: 0 %
Eosinophils Absolute: 0.1 10*3/uL (ref 0.0–0.5)
Eosinophils Relative: 1 %
HCT: 36.2 % — ABNORMAL LOW (ref 39.0–52.0)
Hemoglobin: 12.4 g/dL — ABNORMAL LOW (ref 13.0–17.0)
Immature Granulocytes: 0 %
Lymphocytes Relative: 32 %
Lymphs Abs: 1.4 10*3/uL (ref 0.7–4.0)
MCH: 34.1 pg — ABNORMAL HIGH (ref 26.0–34.0)
MCHC: 34.3 g/dL (ref 30.0–36.0)
MCV: 99.5 fL (ref 80.0–100.0)
Monocytes Absolute: 0.4 10*3/uL (ref 0.1–1.0)
Monocytes Relative: 8 %
Neutro Abs: 2.6 10*3/uL (ref 1.7–7.7)
Neutrophils Relative %: 59 %
Platelets: 97 10*3/uL — ABNORMAL LOW (ref 150–400)
RBC: 3.64 MIL/uL — ABNORMAL LOW (ref 4.22–5.81)
RDW: 13.4 % (ref 11.5–15.5)
WBC: 4.5 10*3/uL (ref 4.0–10.5)
nRBC: 0 % (ref 0.0–0.2)

## 2021-03-08 LAB — COMPREHENSIVE METABOLIC PANEL
ALT: 29 U/L (ref 0–44)
AST: 26 U/L (ref 15–41)
Albumin: 4.5 g/dL (ref 3.5–5.0)
Alkaline Phosphatase: 59 U/L (ref 38–126)
Anion gap: 7 (ref 5–15)
BUN: 21 mg/dL (ref 8–23)
CO2: 28 mmol/L (ref 22–32)
Calcium: 9.9 mg/dL (ref 8.9–10.3)
Chloride: 99 mmol/L (ref 98–111)
Creatinine, Ser: 1.46 mg/dL — ABNORMAL HIGH (ref 0.61–1.24)
GFR, Estimated: 53 mL/min — ABNORMAL LOW (ref >60)
Glucose, Bld: 118 mg/dL — ABNORMAL HIGH (ref 70–99)
Potassium: 3.9 mmol/L (ref 3.5–5.1)
Sodium: 134 mmol/L — ABNORMAL LOW (ref 135–145)
Total Bilirubin: 0.9 mg/dL (ref 0.3–1.2)
Total Protein: 7.8 g/dL (ref 6.5–8.1)

## 2021-03-08 MED ORDER — DENOSUMAB 120 MG/1.7ML ~~LOC~~ SOLN
120.0000 mg | Freq: Once | SUBCUTANEOUS | Status: AC
Start: 2021-03-08 — End: 2021-03-08
  Administered 2021-03-08: 120 mg via SUBCUTANEOUS

## 2021-03-08 NOTE — Progress Notes (Signed)
Hematology/Oncology  Follow up note [facility] [phone] [phone]   Patient Care Team: Tonia Ghent, MD as PCP - General (Family Medicine) Earlie Server, MD as Consulting Physician (Oncology) Beather Arbour Lilyan Punt, MD as Referring Physician (Hematology and Oncology) Diannia Ruder, MD as Referring Physician (Hematology and Oncology)  REASON FOR VISIT Follow up for multiple myeloma.   HISTORY OF PRESENTING ILLNESS:  Luis Burke is a  65 y.o.  male with PMH listed below presents for follow up of multiple myeloma.  # 07/27/2018 multiple myeloma panel showed M protein of 0.1, IgG 662, IgA 49, IgM 7. Patient was called back to further labs done. 08/10/2018, free light chain ratio showed extremely high level of kappa free light chain 10,183, with a kappa lambda light chain ratio of 1414.31 LDH 164 Beta-2 microglobulin 5 Patient was called and discuss about results.  He was recommended to undergo bone marrow biopsy and PET scan. 08/19/2018 bone marrow biopsy showed hypercellular marrow 80%, involved by plasma cell neoplasm up to 95%.  Consistent with plasma cell myeloma. Myeloma FISH  gain of gain of CEP12  09/10/2018 skeletal survey showed questionable small lucent lesions within the midshaft of the humerus bilaterally.  Otherwise no suspicious focal lytic lesion or acute bone abnormality. 08/25/2018 PET scan showed no definite hypermetabolic bone disease but CT findings are highly suspicious for numerous small myelomatous lesions involving the spine, sternum and scattered ribs.  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 He received preparative regimen with melphalan 200 mg/m on 04/22/2019 followed by autologous stem cell infusion on 04/23/2019. Currently he is transfusion independent.  Transfusion criteria with as needed hemoglobin less than 7.5 for platelet less than 10,000.  Transplant course was complicated with  febrile neutropenia grade 3, treated with vancomycin and cephapirin until engraftment on 05/06/2019. Patient also had engraftment syndrome and had to be started on Solu-Medrol 25 mg twice daily on 05/05/2019.  Transition to Medrol Dosepak on 05/07/2019 to complete steroid taper as outpatient.  Patient is currently on acyclovir for 1 year after transplant, dose was reduced on 722 11/20/2018 due to renal function. Patient had a baseline CKD with creatinine running between 1.4-1.6.  He had acute on chronic kidney injury and creatinine went up to 2.2 on 722.  Fluid hydration was given and creatinine decreased to baseline 1.5-1.7. His Hickman catheter was removed.  Candida Cruris treated with topical clotrimazole 1% 3 times daily and to resolution..  #  seen by Dr. Tonia Brooms BMT team on 06/30/2019.revaccination at [facility] begins at 12 months post transplant I discussed with [facility] hematology Dr.Choi and he recommends plans as listed below.  -patient to be started on Ixazomib maintenance: -Ixazomib 37m on D1/D8/D15 out of 28 day cycle.  -patient will start re-vaccination 12 months post transplant.  -Post transplant bone marrow biopsy if clinically indicated.  # seen by [facility] bone marrow transplant team in June 2021.  Patient has been started on immunization protocol.  He reports no new complaints.  #[facility] Lucent lesion in the L4 spinous process measuring 4 mm which is  nonspecific. Consider confirmation of marrow replacing process with MRI.  2. Incompletely characterized renal cysts   # He continues to have chronic back pain and neck pain.  [facility], MRI lumbar spine with and without contrast showed no evidence of spinal metastasis.  Lumbar spondylosis is most pronounced at L5-S1  where there is severe right and moderate left neural foraminal stenosis. 08/11/2020, x-ray cervical spine complete with flexion and extension showed The cervical spine  is visualized from C1 to the top of T1 on the lateral  view.  Reversal of the normal cervical lordosis with a focal kyphosis centered at the C5-C6 intervertebral level. Trace anterolisthesis of C4 onto C5. Trace retrolisthesis of C6 on C7. No evidence ofdynamic instability is noted on  the flexion or extension views.   No prevertebral soft tissue swelling. Cervical vertebral body heights are maintained. Multilevel degenerative changes of the cervical spine including discogenic disease, most pronounced at C5-C6 and C6-C7, and facet arthropathy most pronounced at C4-C5. Multilevel mild to moderate left neural foraminal osseous narrowing of the mid to lower cervical vertebrae, possibly centrally/artifactual by patient positioning.  #07/28/2020 bone marrow normocellular marrow with polytypic plasmacytosis, 3 to 8%   Patient is in remission.  Cytogenetics negative for trisomy 12.  Negative myeloma FISH panel.   # Lumbar lesion,  11/13/20 [facility]  MRI cervical sine w/wo contrast - no evidence of metastatic disease C3-4 severe spinal stenosis, left neural foraminal narrowing. C4-5 spinal canal stenosis. [facility] MRI results showed no metastatic spine lesion. Chronic back pain and neck pain, images are consistent with chronic degenerative disease.  Patient continues to follow-up with [place] orthopedic surgeon.   INTERVAL HISTORY Luis Burke is a 65 y.o. 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 #Patient is currently on ixizomab 3 mg on day 1, day 8, day 15 out of 28 days. He is starting next cycle today.  No nausea vomiting diarrhea.  No bone pain. He has noticed some swelling of lower extremity after recent beach trip.  Swelling improved after leg elevation.  Review of Systems  Constitutional: Negative for appetite change, chills, fatigue, fever and unexpected weight change.  HENT:   Negative for hearing loss and voice change.   Eyes:  Negative for eye problems and icterus.  Respiratory: Negative for chest tightness, cough and shortness of breath.   Cardiovascular: Positive for leg swelling. Negative for chest pain.  Gastrointestinal: Negative for abdominal distention and abdominal pain.  Endocrine: Negative for hot flashes.  Genitourinary: Negative for difficulty urinating, dysuria and frequency.   Musculoskeletal: Positive for back pain. Negative for arthralgias and neck pain.  Skin: Negative for itching and rash.  Neurological: Negative for light-headedness and numbness.  Hematological: Negative for adenopathy. Does not bruise/bleed easily.  Psychiatric/Behavioral: Negative for confusion.    MEDICAL HISTORY:  Past Medical History:  Diagnosis Date  . AKI (acute kidney injury) ([place]) 01/06/2019  . Anemia   . 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[place])    autologous stem cell bone marrow transplant on 04/23/2019.  Marland Kitchen Fatty liver    on u/s 07/2018  . Hyperlipidemia 03/2004  . Hypertension 1994  . Multiple myeloma ([place])   . Snores     SURGICAL HISTORY: Past Surgical History:  Procedure Laterality Date  . CATARACT EXTRACTION W/PHACO Right 03/04/2018   Procedure: CATARACT EXTRACTION PHACO AND INTRAOCULAR LENS PLACEMENT ([place])  RIGHT TORIC;  Surgeon: Leandrew Koyanagi, MD;  Location: [place];  Service: Ophthalmology;  Laterality: Right;  [place] no Toric Lens 1:45 5.3  . COLONOSCOPY WITH PROPOFOL N/A 07/20/2018   Procedure: COLONOSCOPY WITH PROPOFOL;  Surgeon: Jonathon Bellows, MD;  Location: [facility] ENDOSCOPY;  Service: Gastroenterology;  Laterality: N/A;  . ESOPHAGOGASTRODUODENOSCOPY (EGD) WITH PROPOFOL N/A 07/20/2018  Procedure: ESOPHAGOGASTRODUODENOSCOPY (EGD) WITH PROPOFOL;  Surgeon: Jonathon Bellows, MD;  Location: [facility] ENDOSCOPY;  Service: Gastroenterology;  Laterality: N/A;  . EYE SURGERY Right   . HERNIA REPAIR    . L Lap herniorraphy  04/2000  . Left wrist ganglionectomy      SOCIAL HISTORY: Social History    Socioeconomic History  . Marital status: Single    Spouse name: Not on file  . Number of children: 1  . Years of education: Not on file  . Highest education level: Not on file  Occupational History  . Occupation: [employer]  Tobacco Use  . Smoking status: Never Smoker  . Smokeless tobacco: Never Used  . Tobacco comment: Occassionally  Vaping Use  . Vaping Use: Never used  Substance and Sexual Activity  . Alcohol use: Not Currently    Alcohol/week: 3.0 standard drinks    Types: 3 Cans of beer per week  . Drug use: No  . Sexual activity: Not on file  Other Topics Concern  . 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 Hypertension Mother   . Diabetes Mother   . Heart disease Mother        CAD  . Dementia Mother   . Hypertension Father   . Heart disease Father        MI 02/03  . Colon cancer Father   . Hypertension Sister   . Hypertension Sister   . Hypertension Sister   . Prostate cancer Neg Hx     ALLERGIES:  is allergic to bactrim [sulfamethoxazole-trimethoprim], clonidine derivatives, and tadalafil.  MEDICATIONS:  Current Outpatient Medications  Medication Sig Dispense Refill  . acetaminophen (TYLENOL) 325 MG tablet Take 650 mg by mouth every 6 (six) hours as needed.    Marland Kitchen amLODipine (NORVASC) 5 MG tablet Take 2 tablets (10 mg total) by mouth daily. 90 tablet 3  . B Complex Vitamins (VITAMIN B COMPLEX PO) SMARTSIG:By Mouth    .  clopidogrel (PLAVIX) 75 MG tablet Take by mouth.    . co-enzyme Q-10 50 MG capsule Take 50 mg by mouth daily.    . [employer] VITAMIN B12 1000 MCG tablet TAKE 1 TABLET BY MOUTH EVERY DAY 90 tablet 3  . cyclobenzaprine (FLEXERIL) 5 MG tablet Take 1 tablet (5 mg total) by mouth 3 (three) times daily as needed for muscle spasms. 30 tablet 0  . hydrALAZINE (APRESOLINE) 10 MG tablet TAKE 1 TABLET (10 MG TOTAL) BY MOUTH 3 (THREE) TIMES DAILY. 270 tablet 1  . ixazomib citrate (NINLARO) 3 MG capsule Take 1 capsule (3 mg) by mouth weekly, 3 weeks on, 1 week off, repeat every 4 weeks. Take on an empty stomach 1hr before or 2hr after meals. 3 capsule 3  . lisinopril (ZESTRIL) 20 MG tablet TAKE 1 TABLET BY MOUTH EVERY DAY. PLEASE MAKE APPOINTMENT FOR FURTHER REFILLS. 90 tablet 0  . LORazepam (ATIVAN)  1 MG tablet TAKE 1 TABLET (1 MG TOTAL) BY MOUTH EVERY 6 (SIX) HOURS AS NEEDED FOR ANXIETY (FOR NAUSEA)    . magnesium chloride (SLOW-MAG) 64 MG TBEC SR tablet Take 1 tablet (64 mg total) by mouth daily. 60 tablet 0  . metoprolol succinate (TOPROL-XL) 50 MG 24 hr tablet TAKE 4 TABLETS (200 MG TOTAL) BY MOUTH DAILY. TAKE WITH OR IMMEDIATELY FOLLOWING A MEAL. 360 tablet 2  . ondansetron (ZOFRAN) 8 MG tablet Take 1 tablet (8 mg total) by mouth every 8 (eight) hours as needed. for nausea 30 tablet 1  . pravastatin (PRAVACHOL) 10 MG tablet TAKE 1 TABLET BY MOUTH EVERY DAY 90 tablet 3  . zinc gluconate 50 MG tablet Take 50 mg by mouth daily.    Marland Kitchen acyclovir (ZOVIRAX) 400 MG tablet Take 400 mg by mouth 2 (two) times daily. (Patient not taking: Reported on 03/08/2021)    . famotidine (PEPCID) 20 MG tablet Take 20 mg by mouth every other day.    . prochlorperazine (COMPAZINE) 10 MG tablet TAKE 1 TABLET (10 MG TOTAL) BY MOUTH EVERY 6 (SIX) HOURS AS NEEDED FOR NAUSEA (Patient not taking: Reported on 03/08/2021)     No current facility-administered medications for this visit.     PHYSICAL EXAMINATION: ECOG PERFORMANCE STATUS: 1 -  Symptomatic but completely ambulatory Vitals:   03/08/21 1333  BP: (!) 141/86  Pulse: 62  Resp: 18  Temp: 97.8 F (36.6 C)  SpO2: 97%   Filed Weights   03/08/21 1333  Weight: 210 lb 6.9 oz (95.5 kg)    Physical Exam Constitutional:      General: He is not in acute distress. HENT:     Head: Normocephalic and atraumatic.  Eyes:     General: No scleral icterus. Cardiovascular:     Rate and Rhythm: Normal rate and regular rhythm.     Heart sounds: Normal heart sounds.  Pulmonary:     Effort: Pulmonary effort is normal. No respiratory distress.     Breath sounds: No wheezing.  Abdominal:     General: Bowel sounds are normal. There is no distension.     Palpations: Abdomen is soft.  Musculoskeletal:        General: No deformity. Normal range of motion.     Cervical back: Normal range of motion and neck supple.  Skin:    General: Skin is warm and dry.     Findings: No erythema or rash.  Neurological:     Mental Status: He is alert and oriented to person, place, and time. Mental status is at baseline.     Cranial Nerves: No cranial nerve deficit.     Coordination: Coordination normal.  Psychiatric:        Mood and Affect: Mood normal.      LABORATORY DATA:  I have reviewed the data as listed Lab Results  Component Value Date   WBC 4.5 03/08/2021   HGB 12.4 (L) 03/08/2021   HCT 36.2 (L) 03/08/2021   MCV 99.5 03/08/2021   PLT 97 (L) 03/08/2021   Recent Labs    04/27/20 1016 05/30/20 0944 06/22/20 0847 07/20/20 0844 01/11/21 1010 02/08/21 1023 03/08/21 1320  NA 138 138 139   < > 139 139 134*  K 4.0 4.4 4.5   < > 4.2 4.5 3.9  CL 103 104 103   < > 104 105 99  CO2 26 26 26    < > 25 24 28   GLUCOSE 103* 102* 104*   < >  106* 103* 118*  BUN 23 20 24*   < > 22 27* 21  CREATININE 1.41* 1.29* 1.55*   < > 1.34* 1.49* 1.46*  CALCIUM 9.6 9.2 9.8   < > 9.3 9.7 9.9  GFRNONAA 52* 58* 47*   < > 59* 52* 53*  GFRAA >60 >60 54*  --   --   --   --   PROT 7.8 7.4 7.6   < >  7.6 7.7 7.8  ALBUMIN 4.7 4.4 4.3   < > 4.6 4.7 4.5  AST 24 23 24    < > 24 23 26   ALT 24 24 24    < > 25 24 29   ALKPHOS 62 66 59   < > 59 65 59  BILITOT 0.9 0.8 0.8   < > 0.9 0.7 0.9   < > = values in this interval not displayed.   Iron/TIBC/Ferritin/ %Sat    Component Value Date/Time   IRON 74 07/01/2018 0944   TIBC 250 07/01/2018 0944   FERRITIN 357 07/01/2018 0944   IRONPCTSAT 30 07/01/2018 0944    Lab Results  Component Value Date   TOTALPROTELP 7.4 01/11/2021   ALBUMINELP 4.3 01/11/2021   A1GS 0.2 01/11/2021   A2GS 0.6 01/11/2021   BETS 1.2 01/11/2021   GAMS 1.0 01/11/2021   MSPIKE Not Observed 01/11/2021   SPEI Comment 01/11/2021   Lab Results  Component Value Date   KPAFRELGTCHN 47.8 (H) 02/08/2021   LAMBDASER 15.4 02/08/2021   KAPLAMBRATIO 3.10 (H) 02/08/2021    RADIOGRAPHIC STUDIES: I have personally reviewed the radiological images as listed and agreed with the findings in the report. No results found.    ASSESSMENT & PLAN:  1. Multiple myeloma in remission ([place])   2. Encounter for antineoplastic chemotherapy   3. Thrombocytopenia ([facility])   4. Stage 3a chronic kidney disease ([place])    #Light chain multiple myeloma beta 2 microglobulin 5 and normal albumin.  Stage II. cytogenetics showed normal male chromosome, MDS FISH panel showed trisomy 2- Standard Risk. S/p RVD x 9 and  Autologous bone marrow stem cell transplant at [facility] on 04/23/2019. 01/11/21 Light chain ratio further increased to 3.42. 02/08/2021, light chain ratio 3.1. Labs reviewed and discussed with patient Proceed with current cycle of Ixazomib.  I discussed about the possibility of bone marrow biopsy if light chain ratio continues to progressively increase.  Thrombocytopenia, count is 97, slightly lower than his baseline.  chronic kidney disease, creatinine is slightly elevated recommend patient to increase oral hydration. Marland Kitchen   Avoid nephrotoxin.  Post bone marrow transplant care, He has  completed 1 year of acyclovir.  Off acyclovir now. Post transplant vaccination at [facility].   #Bone health/osteopenia/multiple myeloma-  Continue Xgeva monthly for up to 2 years, proceed with Xgeva today.  Patient has had a recent cavity feeling and temporary crown restoration Continue calcium and vitamin D supplementation. Follow up 4 weeks for evaluation prior to starting next cycle of chemotherapy for maintenance.  Earlie Server, MD, PhD Hematology Oncology [place] at [facility] 03/08/2021

## 2021-03-08 NOTE — Progress Notes (Signed)
Xgeva injection administered to right arm as ordered, site benign.  Pt tolerated injection well and left the infusion suite stable and ambulatory.

## 2021-03-08 NOTE — Patient Instructions (Signed)
Denosumab injection What is this medicine? DENOSUMAB (den oh sue mab) slows bone breakdown. Prolia is used to treat osteoporosis in women after menopause and in men, and in people who are taking corticosteroids for 6 months or more. Xgeva is used to treat a high calcium level due to cancer and to prevent bone fractures and other bone problems caused by multiple myeloma or cancer bone metastases. Xgeva is also used to treat giant cell tumor of the bone. This medicine may be used for other purposes; ask your health care provider or pharmacist if you have questions. COMMON BRAND NAME(S): Prolia, XGEVA What should I tell my health care provider before I take this medicine? They need to know if you have any of these conditions:  dental disease  having surgery or tooth extraction  infection  kidney disease  low levels of calcium or Vitamin D in the blood  malnutrition  on hemodialysis  skin conditions or sensitivity  thyroid or parathyroid disease  an unusual reaction to denosumab, other medicines, foods, dyes, or preservatives  pregnant or trying to get pregnant  breast-feeding How should I use this medicine? This medicine is for injection under the skin. It is given by a health care professional in a hospital or clinic setting. A special MedGuide will be given to you before each treatment. Be sure to read this information carefully each time. For Prolia, talk to your pediatrician regarding the use of this medicine in children. Special care may be needed. For Xgeva, talk to your pediatrician regarding the use of this medicine in children. While this drug may be prescribed for children as young as 13 years for selected conditions, precautions do apply. Overdosage: If you think you have taken too much of this medicine contact a poison control center or emergency room at once. NOTE: This medicine is only for you. Do not share this medicine with others. What if I miss a dose? It is  important not to miss your dose. Call your doctor or health care professional if you are unable to keep an appointment. What may interact with this medicine? Do not take this medicine with any of the following medications:  other medicines containing denosumab This medicine may also interact with the following medications:  medicines that lower your chance of fighting infection  steroid medicines like prednisone or cortisone This list may not describe all possible interactions. Give your health care provider a list of all the medicines, herbs, non-prescription drugs, or dietary supplements you use. Also tell them if you smoke, drink alcohol, or use illegal drugs. Some items may interact with your medicine. What should I watch for while using this medicine? Visit your doctor or health care professional for regular checks on your progress. Your doctor or health care professional may order blood tests and other tests to see how you are doing. Call your doctor or health care professional for advice if you get a fever, chills or sore throat, or other symptoms of a cold or flu. Do not treat yourself. This drug may decrease your body's ability to fight infection. Try to avoid being around people who are sick. You should make sure you get enough calcium and vitamin D while you are taking this medicine, unless your doctor tells you not to. Discuss the foods you eat and the vitamins you take with your health care professional. See your dentist regularly. Brush and floss your teeth as directed. Before you have any dental work done, tell your dentist you are   receiving this medicine. Do not become pregnant while taking this medicine or for 5 months after stopping it. Talk with your doctor or health care professional about your birth control options while taking this medicine. Women should inform their doctor if they wish to become pregnant or think they might be pregnant. There is a potential for serious side  effects to an unborn child. Talk to your health care professional or pharmacist for more information. What side effects may I notice from receiving this medicine? Side effects that you should report to your doctor or health care professional as soon as possible:  allergic reactions like skin rash, itching or hives, swelling of the face, lips, or tongue  bone pain  breathing problems  dizziness  jaw pain, especially after dental work  redness, blistering, peeling of the skin  signs and symptoms of infection like fever or chills; cough; sore throat; pain or trouble passing urine  signs of low calcium like fast heartbeat, muscle cramps or muscle pain; pain, tingling, numbness in the hands or feet; seizures  unusual bleeding or bruising  unusually weak or tired Side effects that usually do not require medical attention (report to your doctor or health care professional if they continue or are bothersome):  constipation  diarrhea  headache  joint pain  loss of appetite  muscle pain  runny nose  tiredness  upset stomach This list may not describe all possible side effects. Call your doctor for medical advice about side effects. You may report side effects to FDA at 1-800-FDA-1088. Where should I keep my medicine? This medicine is only given in a clinic, doctor's office, or other health care setting and will not be stored at home. NOTE: This sheet is a summary. It may not cover all possible information. If you have questions about this medicine, talk to your doctor, pharmacist, or health care provider.  2021 Elsevier/Gold Standard (2018-02-06 16:10:44)

## 2021-03-09 LAB — KAPPA/LAMBDA LIGHT CHAINS
Kappa free light chain: 48.8 mg/L — ABNORMAL HIGH (ref 3.3–19.4)
Kappa, lambda light chain ratio: 3.01 — ABNORMAL HIGH (ref 0.26–1.65)
Lambda free light chains: 16.2 mg/L (ref 5.7–26.3)

## 2021-03-13 LAB — PROTEIN ELECTROPHORESIS, SERUM
A/G Ratio: 1.3 (ref 0.7–1.7)
Albumin ELP: 4 g/dL (ref 2.9–4.4)
Alpha-1-Globulin: 0.2 g/dL (ref 0.0–0.4)
Alpha-2-Globulin: 0.7 g/dL (ref 0.4–1.0)
Beta Globulin: 1.1 g/dL (ref 0.7–1.3)
Gamma Globulin: 1.1 g/dL (ref 0.4–1.8)
Globulin, Total: 3.1 g/dL (ref 2.2–3.9)
Total Protein ELP: 7.1 g/dL (ref 6.0–8.5)

## 2021-03-14 ENCOUNTER — Other Ambulatory Visit: Payer: Self-pay | Admitting: Pharmacist

## 2021-03-14 DIAGNOSIS — C9000 Multiple myeloma not having achieved remission: Secondary | ICD-10-CM

## 2021-03-14 MED ORDER — IXAZOMIB CITRATE 3 MG PO CAPS: ORAL_CAPSULE | ORAL | 3 refills | Status: DC

## 2021-03-17 ENCOUNTER — Other Ambulatory Visit: Payer: Self-pay | Admitting: Family Medicine

## 2021-04-01 ENCOUNTER — Encounter: Payer: Self-pay | Admitting: Oncology

## 2021-04-03 ENCOUNTER — Encounter: Payer: Self-pay | Admitting: Oncology

## 2021-04-03 ENCOUNTER — Inpatient Hospital Stay (HOSPITAL_BASED_OUTPATIENT_CLINIC_OR_DEPARTMENT_OTHER): Payer: Medicare Other | Admitting: Oncology

## 2021-04-03 ENCOUNTER — Inpatient Hospital Stay: Payer: Medicare Other | Attending: Oncology

## 2021-04-03 VITALS — BP 136/87 | HR 56 | Temp 96.8°F | Resp 18 | Wt 208.3 lb

## 2021-04-03 DIAGNOSIS — C9001 Multiple myeloma in remission: Secondary | ICD-10-CM | POA: Diagnosis present

## 2021-04-03 DIAGNOSIS — N1831 Chronic kidney disease, stage 3a: Secondary | ICD-10-CM

## 2021-04-03 DIAGNOSIS — Z9481 Bone marrow transplant status: Secondary | ICD-10-CM | POA: Insufficient documentation

## 2021-04-03 DIAGNOSIS — Z5111 Encounter for antineoplastic chemotherapy: Secondary | ICD-10-CM

## 2021-04-03 DIAGNOSIS — D696 Thrombocytopenia, unspecified: Secondary | ICD-10-CM | POA: Insufficient documentation

## 2021-04-03 LAB — COMPREHENSIVE METABOLIC PANEL
ALT: 22 U/L (ref 0–44)
AST: 22 U/L (ref 15–41)
Albumin: 4.5 g/dL (ref 3.5–5.0)
Alkaline Phosphatase: 55 U/L (ref 38–126)
Anion gap: 8 (ref 5–15)
BUN: 20 mg/dL (ref 8–23)
CO2: 26 mmol/L (ref 22–32)
Calcium: 9.2 mg/dL (ref 8.9–10.3)
Chloride: 103 mmol/L (ref 98–111)
Creatinine, Ser: 1.26 mg/dL — ABNORMAL HIGH (ref 0.61–1.24)
GFR, Estimated: >60 mL/min (ref >60)
Glucose, Bld: 109 mg/dL — ABNORMAL HIGH (ref 70–99)
Potassium: 4.3 mmol/L (ref 3.5–5.1)
Sodium: 137 mmol/L (ref 135–145)
Total Bilirubin: 0.9 mg/dL (ref 0.3–1.2)
Total Protein: 7.7 g/dL (ref 6.5–8.1)

## 2021-04-03 LAB — CBC WITH DIFFERENTIAL/PLATELET
Abs Immature Granulocytes: 0.01 10*3/uL (ref 0.00–0.07)
Basophils Absolute: 0 10*3/uL (ref 0.0–0.1)
Basophils Relative: 0 %
Eosinophils Absolute: 0 10*3/uL (ref 0.0–0.5)
Eosinophils Relative: 1 %
HCT: 37.5 % — ABNORMAL LOW (ref 39.0–52.0)
Hemoglobin: 13.2 g/dL (ref 13.0–17.0)
Immature Granulocytes: 0 %
Lymphocytes Relative: 37 %
Lymphs Abs: 1.6 10*3/uL (ref 0.7–4.0)
MCH: 34.6 pg — ABNORMAL HIGH (ref 26.0–34.0)
MCHC: 35.2 g/dL (ref 30.0–36.0)
MCV: 98.4 fL (ref 80.0–100.0)
Monocytes Absolute: 0.5 10*3/uL (ref 0.1–1.0)
Monocytes Relative: 10 %
Neutro Abs: 2.2 10*3/uL (ref 1.7–7.7)
Neutrophils Relative %: 52 %
Platelets: 71 10*3/uL — ABNORMAL LOW (ref 150–400)
RBC: 3.81 MIL/uL — ABNORMAL LOW (ref 4.22–5.81)
RDW: 13.2 % (ref 11.5–15.5)
WBC: 4.3 10*3/uL (ref 4.0–10.5)
nRBC: 0 % (ref 0.0–0.2)

## 2021-04-03 NOTE — Progress Notes (Signed)
Patient here for follow up. No new concerns voiced.  °

## 2021-04-03 NOTE — Progress Notes (Signed)
Hematology/Oncology  Follow up note [facility] Telephone:([phone] Fax:([phone]   Patient Care Team: Tonia Ghent, MD as PCP - General (Family Medicine) Earlie Server, MD as Consulting Physician (Oncology) Beather Arbour Lilyan Punt, MD as Referring Physician (Hematology and Oncology) Diannia Ruder, MD as Referring Physician (Hematology and Oncology)  REASON FOR VISIT Follow up for multiple myeloma.   HISTORY OF PRESENTING ILLNESS:  Luis Burke is a  65 y.o.  male with PMH listed below presents for follow up of multiple myeloma.  # 07/27/2018 multiple myeloma panel showed M protein of 0.1, IgG 662, IgA 49, IgM 7. Patient was called back to further labs done. 08/10/2018, free light chain ratio showed extremely high level of kappa free light chain 10,183, with a kappa lambda light chain ratio of 1414.31 LDH 164 Beta-2 microglobulin 5 Patient was called and discuss about results.  He was recommended to undergo bone marrow biopsy and PET scan. 08/19/2018 bone marrow biopsy showed hypercellular marrow 80%, involved by plasma cell neoplasm up to 95%.  Consistent with plasma cell myeloma. Myeloma FISH  gain of gain of CEP12  09/10/2018 skeletal survey showed questionable small lucent lesions within the midshaft of the humerus bilaterally.  Otherwise no suspicious focal lytic lesion or acute bone abnormality. 08/25/2018 PET scan showed no definite hypermetabolic bone disease but CT findings are highly suspicious for numerous small myelomatous lesions involving the spine, sternum and scattered ribs.  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 He received preparative regimen with melphalan 200 mg/m on 04/22/2019 followed by autologous stem cell infusion on 04/23/2019. Currently he is transfusion independent.  Transfusion criteria with as needed hemoglobin less than 7.5 for platelet less than 10,000.  Transplant course was complicated with  febrile neutropenia grade 3, treated with vancomycin and cephapirin until engraftment on 05/06/2019. Patient also had engraftment syndrome and had to be started on Solu-Medrol 25 mg twice daily on 05/05/2019.  Transition to Medrol Dosepak on 05/07/2019 to complete steroid taper as outpatient.  Patient is currently on acyclovir for 1 year after transplant, dose was reduced on 722 11/20/2018 due to renal function. Patient had a baseline CKD with creatinine running between 1.4-1.6.  He had acute on chronic kidney injury and creatinine went up to 2.2 on 722.  Fluid hydration was given and creatinine decreased to baseline 1.5-1.7. His Hickman catheter was removed.  Candida Cruris treated with topical clotrimazole 1% 3 times daily and to resolution..  #  seen by Dr. Tonia Brooms BMT team on 06/30/2019.revaccination at [place] begins at 12 months post transplant I discussed with [facility] hematology Dr.Choi and he recommends plans as listed below.  -patient to be started on Ixazomib maintenance: -Ixazomib 51m on D1/D8/D15 out of 28 day cycle.  -patient will start re-vaccination 12 months post transplant.  -Post transplant bone marrow biopsy if clinically indicated.  # seen by [facility] bone marrow transplant team in June 2021.  Patient has been started on immunization protocol.  He reports no new complaints.  #[facility]12[facility]  Lucent lesion in the L4 spinous process measuring 4 mm which is  nonspecific. Consider confirmation of marrow replacing process with MRI.  2.  Incompletely characterized renal cysts   # He continues to have chronic back pain and neck pain.  [facility], MRI lumbar spine with and without contrast showed no evidence of spinal metastasis.  Lumbar spondylosis is most pronounced  at L5-S1 where there is severe right and moderate left neural foraminal stenosis. 08/11/2020, x-ray cervical spine complete with flexion and extension showed The cervical spine  is visualized from C1 to the top of T1 on the lateral  view.  Reversal of the normal cervical lordosis with a focal kyphosis centered at the C5-C6 intervertebral level. Trace anterolisthesis of C4 onto C5. Trace retrolisthesis of C6 on C7. No evidence of dynamic instability is noted on  the flexion or extension views.   No prevertebral soft tissue swelling. Cervical vertebral body heights are maintained. Multilevel degenerative changes of the cervical spine including discogenic disease, most pronounced at C5-C6 and C6-C7, and facet arthropathy most pronounced at C4-C5. Multilevel mild to moderate left neural foraminal osseous narrowing of the mid to lower cervical vertebrae, possibly centrally/artifactual by patient positioning.  #07/28/2020 bone marrow normocellular marrow with polytypic plasmacytosis, 3 to 8%   Patient is in remission.  Cytogenetics negative for trisomy 12.  Negative myeloma FISH panel.   # Lumbar lesion,  11/13/20 [facility]  MRI cervical sine w/wo contrast - no evidence of metastatic disease C3-4 severe spinal stenosis, left neural foraminal narrowing. C4-5 spinal canal stenosis. [facility] MRI results showed no metastatic spine lesion. Chronic back pain and neck pain, images are consistent with chronic degenerative disease.  Patient continues to follow-up with [place] orthopedic surgeon.   INTERVAL HISTORY Luis Burke is a 65 y.o. 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 #Patient is currently on ixizomab 3 mg on day 1, day 8, day 15 out of 28 days. He reports feeling well.  Intermittent back pain which is chronic and has not changed much.  He is starting next cycle on 04/05/2021   No nausea vomiting diarrhea.  No new bone pain. Leg swelling has resolved.   Review of Systems  Constitutional:  Negative for appetite change, chills, fatigue, fever and unexpected weight change.  HENT:   Negative for hearing loss and  voice change.   Eyes:  Negative for eye problems and icterus.  Respiratory:  Negative for chest tightness, cough and shortness of breath.   Cardiovascular:  Positive for leg swelling. Negative for chest pain.  Gastrointestinal:  Negative for abdominal distention and abdominal pain.  Endocrine: Negative for hot flashes.  Genitourinary:  Negative for difficulty urinating, dysuria and frequency.   Musculoskeletal:  Positive for back pain. Negative for arthralgias and neck pain.  Skin:  Negative for itching and rash.  Neurological:  Negative for light-headedness and numbness.  Hematological:  Negative for adenopathy. Does not bruise/bleed easily.  Psychiatric/Behavioral:  Negative for confusion.    MEDICAL HISTORY:  Past Medical History:  Diagnosis Date   AKI (acute kidney injury) ([place]) 01/06/2019   Anemia    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[place])    autologous stem cell bone marrow transplant on 04/23/2019.   Fatty liver    on u/s 07/2018   Hyperlipidemia 03/2004   Hypertension 1994   Multiple myeloma ([place])    Snores     SURGICAL HISTORY: Past Surgical History:  Procedure Laterality Date   CATARACT EXTRACTION W/PHACO Right 03/04/2018   Procedure: CATARACT EXTRACTION PHACO AND INTRAOCULAR LENS PLACEMENT ([place])  RIGHT TORIC;  Surgeon: Leandrew Koyanagi, MD;  Location: [facility];  Service: Ophthalmology;  Laterality: Right;  [place] no Toric Lens 1:45 5.3   COLONOSCOPY WITH PROPOFOL N/A 07/20/2018   Procedure: COLONOSCOPY WITH PROPOFOL;  Surgeon: Jonathon Bellows,  MD;  Location: [facility] ENDOSCOPY;  Service: Gastroenterology;  Laterality: N/A;   ESOPHAGOGASTRODUODENOSCOPY (EGD) WITH PROPOFOL N/A 07/20/2018   Procedure: ESOPHAGOGASTRODUODENOSCOPY (EGD) WITH PROPOFOL;  Surgeon: Jonathon Bellows, MD;  Location[facility] END[facility]COPY;  Service: Gastroenterology;  Laterality: N/A;   EYE SURGERY Right    HERNIA REPAIR     L Lap herniorraphy  04/2000   Left wrist ganglionectomy      SOCIAL  HISTORY: Social History   Socioeconomic History   Marital status: Single    Spouse name: Not on file   Number of children: 1   Years of education: Not on file   Highest education level: Not on file  Occupational History   Occupation: [employer]  Tobacco Use   Smoking status: Never   Smokeless tobacco: Never   Tobacco comments:    Occassionally  Vaping Use   Vaping Use: Never used  Substance and Sexual Activity   Alcohol use: Not Currently    Alcohol/week: 3.0 standard drinks    Types: 3 Cans of beer per week   Drug use: No   Sexual activity: Not on file  Other Topics Concern   Not on file  Social History Narrative   Divorced 12-04-2007, dating as of 12/03/2017   His daughter died 4 days after giving birth to the patient's granddaughter   Has joint custody of his dead daughter's child   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 Strain: Not on [employer] Insecurity: Not on file  Transportation Needs: Not on file  Physical Activity: Not on file  Stress: Not on file  Social Connections: Not on file  Intimate Partner Violence: Not on file    FAMILY HISTORY: Family History  Problem Relation Age of Onset   Hypertension Mother    Diabetes Mother    Heart disease Mother        CAD   Dementia Mother    Hypertension Father    Heart disease Father        MI 02/03   Colon cancer Father    Hypertension Sister    Hypertension Sister    Hypertension Sister    Prostate cancer Neg Hx     ALLERGIES:  is allergic to bactrim [sulfamethoxazole-trimethoprim], clonidine derivatives, and tadalafil.  MEDICATIONS:  Current Outpatient Medications  Medication Sig Dispense Refill   acetaminophen (TYLENOL) 325 MG tablet Take 650 mg by mouth every 6 (six) hours as needed.     amLODipine (NORVASC) 5 MG tablet Take 2 tablets (10 mg total) by mouth daily. 90 tablet 3   B Complex Vitamins (VITAMIN B COMPLEX PO) SMARTSIG:By Mouth     clopidogrel  (PLAVIX) 75 MG tablet Take by mouth.     co-enzyme Q-10 50 MG capsule Take 50 mg by mouth daily.     [employer] VITAMIN B12 1000 MCG tablet TAKE 1 TABLET BY MOUTH EVERY DAY 90 tablet 3   cyclobenzaprine (FLEXERIL) 5 MG tablet Take 1 tablet (5 mg total) by mouth 3 (three) times daily as needed for muscle spasms. 30 tablet 0   hydrALAZINE (APRESOLINE) 10 MG tablet TAKE 1 TABLET (10 MG TOTAL) BY MOUTH 3 (THREE) TIMES DAILY. 270 tablet 1   ixazomib citrate (NINLARO) 3 MG capsule Take 1 capsule (3 mg) by mouth weekly, 3 weeks on, 1 week off, repeat every 4 weeks. Take on an empty stomach 1hr before or 2hr after meals. 3 capsule 3   lisinopril (ZESTRIL) 20  MG tablet TAKE 1 TABLET BY MOUTH EVERY DAY. PLEASE MAKE APPOINTMENT FOR FURTHER REFILLS. 90 tablet 0   LORazepam (ATIVAN) 1 MG tablet TAKE 1 TABLET (1 MG TOTAL) BY MOUTH EVERY 6 (SIX) HOURS AS NEEDED FOR ANXIETY (FOR NAUSEA)     magnesium chloride (SLOW-MAG) 64 MG TBEC SR tablet Take 1 tablet (64 mg total) by mouth daily. 60 tablet 0   metoprolol succinate (TOPROL-XL) 50 MG 24 hr tablet TAKE 4 TABLETS (200 MG TOTAL) BY MOUTH DAILY. TAKE WITH OR IMMEDIATELY FOLLOWING A MEAL. 360 tablet 2   ondansetron (ZOFRAN) 8 MG tablet Take 1 tablet (8 mg total) by mouth every 8 (eight) hours as needed. for nausea 30 tablet 1   pravastatin (PRAVACHOL) 10 MG tablet TAKE 1 TABLET BY MOUTH EVERY DAY 90 tablet 3   zinc gluconate 50 MG tablet Take 50 mg by mouth daily.     acyclovir (ZOVIRAX) 400 MG tablet Take 400 mg by mouth 2 (two) times daily. (Patient not taking: No sig reported)     prochlorperazine (COMPAZINE) 10 MG tablet TAKE 1 TABLET (10 MG TOTAL) BY MOUTH EVERY 6 (SIX) HOURS AS NEEDED FOR NAUSEA (Patient not taking: No sig reported)     No current facility-administered medications for this visit.     PHYSICAL EXAMINATION: ECOG PERFORMANCE STATUS: 1 - Symptomatic but completely ambulatory Vitals:   04/03/21 1123  BP: 136/87  Pulse: (!) 56  Resp: 18  Temp: (!)  96.8 F (36 C)   Filed Weights   04/03/21 1123  Weight: 208 lb 4.8 oz (94.5 kg)    Physical Exam Constitutional:      General: He is not in acute distress. HENT:     Head: Normocephalic and atraumatic.  Eyes:     General: No scleral icterus. Cardiovascular:     Rate and Rhythm: Normal rate and regular rhythm.     Heart sounds: Normal heart sounds.  Pulmonary:     Effort: Pulmonary effort is normal. No respiratory distress.     Breath sounds: No wheezing.  Abdominal:     General: Bowel sounds are normal. There is no distension.     Palpations: Abdomen is soft.  Musculoskeletal:        General: No deformity. Normal range of motion.     Cervical back: Normal range of motion and neck supple.  Skin:    General: Skin is warm and dry.     Findings: No erythema or rash.  Neurological:     Mental Status: He is alert and oriented to person, place, and time. Mental status is at baseline.     Cranial Nerves: No cranial nerve deficit.     Coordination: Coordination normal.  Psychiatric:        Mood and Affect: Mood normal.     LABORATORY DATA:  I have reviewed the data as listed Lab Results  Component Value Date   WBC 4.3 04/03/2021   HGB 13.2 04/03/2021   HCT 37.5 (L) 04/03/2021   MCV 98.4 04/03/2021   PLT 71 (L) 04/03/2021   Recent Labs    04/27/20 1016 05/30/20 0944 06/22/20 0847 07/20/20 0844 02/08/21 1023 03/08/21 1320 04/03/21 1055  NA 138 138 139   < > 139 134* 137  K 4.0 4.4 4.5   < > 4.5 3.9 4.3  CL 103 104 103   < > 105 99 103  CO2 _0 < > _1 GLUCOSE 103* 102* 104*   < >  103* 118* 109*  BUN 23 20 24*   < > 27* 21 20  CREATININE 1.41* 1.29* 1.55*   < > 1.49* 1.46* 1.26*  CALCIUM 9.6 9.2 9.8   < > 9.7 9.9 9.2  GFRNONAA 52* 58* 47*   < > 52* 53* >60  GFRAA >60 >60 54*  --   --   --   --   PROT 7.8 7.4 7.6   < > 7.7 7.8 7.7  ALBUMIN 4.7 4.4 4.3   < > 4.7 4.5 4.5  AST _0 < > _1 ALT _2 < > _3 ALKPHOS 62 66 59    < > 65 59 55  BILITOT 0.9 0.8 0.8   < > 0.7 0.9 0.9   < > = values in this interval not displayed.    Iron/TIBC/Ferritin/ %Sat    Component Value Date/Time   IRON 74 07/01/2018 0944   TIBC 250 07/01/2018 0944   FERRITIN 357 07/01/2018 0944   IRONPCTSAT 30 07/01/2018 0944    Lab Results  Component Value Date   TOTALPROTELP 7.1 03/08/2021   ALBUMINELP 4.0 03/08/2021   A1GS 0.2 03/08/2021   A2GS 0.7 03/08/2021   BETS 1.1 03/08/2021   GAMS 1.1 03/08/2021   MSPIKE Not Observed 03/08/2021   SPEI Comment 03/08/2021   Lab Results  Component Value Date   KPAFRELGTCHN 48.8 (H) 03/08/2021   LAMBDASER 16.2 03/08/2021   KAPLAMBRATIO 3.01 (H) 03/08/2021    RADIOGRAPHIC STUDIES: I have personally reviewed the radiological images as listed and agreed with the findings in the report. No results found.    ASSESSMENT & PLAN:  1. Multiple myeloma in remission ([place])   2. Encounter for antineoplastic chemotherapy   3. Thrombocytopenia ([facility])   4. Stage 3a chronic kidney disease ([place])    #Light chain multiple myeloma beta 2 microglobulin 5 and normal albumin.  Stage II. cytogenetics showed normal male chromosome, MDS FISH panel showed trisomy 80- Standard Risk. S/p RVD x 9 and  Autologous bone [facility] stem [facility] at [facility] on 04/23/2019. 01/11/21 Light chain ratio further increased to 3.42. 03/08/2021, light chain ratio 3.01, free kappa light chain 48.8 Labs reviewed and discussed with patient. Proceed with next cycle of Ixazomib on 04/05/2021.  Thrombocytopenia, count is 71, lower than his baseline.  Close monitoring.  chronic kidney disease, creatinine 1.26.  Stable.  Encourage oral hydration avoid nephrotoxins.  Post bone marrow transplant care, He has completed 1 year of acyclovir.  Off acyclovir now. Post [facility] vaccination at [facility].   #Bone health/osteopenia/multiple myeloma-  Continue Xgeva monthly for up to 2 years, proceed with Xgeva on 04/05/2021.   Continue calcium  and vitamin D supplementation. Follow up 4 weeks for evaluation prior to starting next cycle of chemotherapy for maintenance.  Earlie Server, MD, PhD Hematology Oncology [place] [facility] 04/03/2021

## 2021-04-04 LAB — KAPPA/LAMBDA LIGHT CHAINS
Kappa free light chain: 53.8 mg/L — ABNORMAL HIGH (ref 3.3–19.4)
Kappa, lambda light chain ratio: 3.76 — ABNORMAL HIGH (ref 0.26–1.65)
Lambda free light chains: 14.3 mg/L (ref 5.7–26.3)

## 2021-04-05 ENCOUNTER — Other Ambulatory Visit: Payer: Self-pay

## 2021-04-05 ENCOUNTER — Inpatient Hospital Stay: Payer: Medicare Other

## 2021-04-05 DIAGNOSIS — N179 Acute kidney failure, unspecified: Secondary | ICD-10-CM

## 2021-04-05 DIAGNOSIS — C9001 Multiple myeloma in remission: Secondary | ICD-10-CM | POA: Diagnosis not present

## 2021-04-05 MED ORDER — DENOSUMAB 120 MG/1.7ML ~~LOC~~ SOLN
120.0000 mg | Freq: Once | SUBCUTANEOUS | Status: AC
Start: 2021-04-05 — End: 2021-04-05
  Administered 2021-04-05: 120 mg via SUBCUTANEOUS
  Filled 2021-04-05: qty 1.7

## 2021-04-26 ENCOUNTER — Encounter: Payer: Self-pay | Admitting: Oncology

## 2021-05-01 ENCOUNTER — Encounter: Payer: Self-pay | Admitting: Oncology

## 2021-05-01 ENCOUNTER — Other Ambulatory Visit: Payer: Self-pay

## 2021-05-01 ENCOUNTER — Inpatient Hospital Stay (HOSPITAL_BASED_OUTPATIENT_CLINIC_OR_DEPARTMENT_OTHER): Payer: Medicare Other | Admitting: Oncology

## 2021-05-01 ENCOUNTER — Inpatient Hospital Stay: Payer: Medicare Other | Attending: Oncology

## 2021-05-01 ENCOUNTER — Other Ambulatory Visit: Payer: Self-pay | Admitting: Oncology

## 2021-05-01 VITALS — BP 147/89 | HR 64 | Temp 97.4°F | Resp 18 | Wt 209.0 lb

## 2021-05-01 DIAGNOSIS — E785 Hyperlipidemia, unspecified: Secondary | ICD-10-CM | POA: Diagnosis not present

## 2021-05-01 DIAGNOSIS — C9001 Multiple myeloma in remission: Secondary | ICD-10-CM

## 2021-05-01 DIAGNOSIS — D696 Thrombocytopenia, unspecified: Secondary | ICD-10-CM | POA: Insufficient documentation

## 2021-05-01 DIAGNOSIS — Z79899 Other long term (current) drug therapy: Secondary | ICD-10-CM | POA: Insufficient documentation

## 2021-05-01 DIAGNOSIS — Z9484 Stem cells transplant status: Secondary | ICD-10-CM | POA: Diagnosis not present

## 2021-05-01 DIAGNOSIS — M8580 Other specified disorders of bone density and structure, unspecified site: Secondary | ICD-10-CM | POA: Diagnosis not present

## 2021-05-01 DIAGNOSIS — N1831 Chronic kidney disease, stage 3a: Secondary | ICD-10-CM | POA: Insufficient documentation

## 2021-05-01 DIAGNOSIS — Z5111 Encounter for antineoplastic chemotherapy: Secondary | ICD-10-CM

## 2021-05-01 LAB — CBC WITH DIFFERENTIAL/PLATELET
Abs Immature Granulocytes: 0.01 10*3/uL (ref 0.00–0.07)
Basophils Absolute: 0 10*3/uL (ref 0.0–0.1)
Basophils Relative: 0 %
Eosinophils Absolute: 0 10*3/uL (ref 0.0–0.5)
Eosinophils Relative: 1 %
HCT: 38.1 % — ABNORMAL LOW (ref 39.0–52.0)
Hemoglobin: 12.9 g/dL — ABNORMAL LOW (ref 13.0–17.0)
Immature Granulocytes: 0 %
Lymphocytes Relative: 19 %
Lymphs Abs: 0.8 10*3/uL (ref 0.7–4.0)
MCH: 33.8 pg (ref 26.0–34.0)
MCHC: 33.9 g/dL (ref 30.0–36.0)
MCV: 99.7 fL (ref 80.0–100.0)
Monocytes Absolute: 0.3 10*3/uL (ref 0.1–1.0)
Monocytes Relative: 8 %
Neutro Abs: 2.9 10*3/uL (ref 1.7–7.7)
Neutrophils Relative %: 72 %
Platelets: 58 10*3/uL — ABNORMAL LOW (ref 150–400)
RBC: 3.82 MIL/uL — ABNORMAL LOW (ref 4.22–5.81)
RDW: 13 % (ref 11.5–15.5)
WBC: 4 10*3/uL (ref 4.0–10.5)
nRBC: 0 % (ref 0.0–0.2)

## 2021-05-01 LAB — COMPREHENSIVE METABOLIC PANEL
ALT: 21 U/L (ref 0–44)
AST: 23 U/L (ref 15–41)
Albumin: 4.6 g/dL (ref 3.5–5.0)
Alkaline Phosphatase: 54 U/L (ref 38–126)
Anion gap: 9 (ref 5–15)
BUN: 19 mg/dL (ref 8–23)
CO2: 25 mmol/L (ref 22–32)
Calcium: 9.6 mg/dL (ref 8.9–10.3)
Chloride: 103 mmol/L (ref 98–111)
Creatinine, Ser: 1.39 mg/dL — ABNORMAL HIGH (ref 0.61–1.24)
GFR, Estimated: 56 mL/min — ABNORMAL LOW (ref >60)
Glucose, Bld: 105 mg/dL — ABNORMAL HIGH (ref 70–99)
Potassium: 4.2 mmol/L (ref 3.5–5.1)
Sodium: 137 mmol/L (ref 135–145)
Total Bilirubin: 0.8 mg/dL (ref 0.3–1.2)
Total Protein: 7.7 g/dL (ref 6.5–8.1)

## 2021-05-01 NOTE — Progress Notes (Signed)
Hematology/Oncology  Follow up note [facility] [phone] Fax:([phone]) [phone]   Patient Care Team: Tonia Ghent, MD as PCP - General (Family Medicine) Earlie Server, MD as Consulting Physician (Oncology) Beather Arbour Lilyan Punt, MD as Referring Physician (Hematology and Oncology) Diannia Ruder, MD as Referring Physician (Hematology and Oncology)  REASON FOR VISIT Follow up for multiple myeloma.   HISTORY OF PRESENTING ILLNESS:  Luis Burke is a  65 y.o.  male with PMH listed below presents for follow up of multiple myeloma.  # 07/27/2018 multiple myeloma panel showed M protein of 0.1, IgG 662, IgA 49, IgM 7. Patient was called back to further labs done. 08/10/2018, free light chain ratio showed extremely high level of kappa free light chain 10,183, with a kappa lambda light chain ratio of 1414.31 LDH 164 Beta-2 microglobulin 5 Patient was called and discuss about results.  He was recommended to undergo bone marrow biopsy and PET scan. 08/19/2018 bone marrow biopsy showed hypercellular marrow 80%, involved by plasma cell neoplasm up to 95%.  Consistent with plasma cell myeloma. Myeloma FISH  gain of gain of CEP12  09/10/2018 skeletal survey showed questionable small lucent lesions within the midshaft of the humerus bilaterally.  Otherwise no suspicious focal lytic lesion or acute bone abnormality. 08/25/2018 PET scan showed no definite hypermetabolic bone disease but CT findings are highly suspicious for numerous small myelomatous lesions involving the spine, sternum and scattered ribs.  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 He received preparative regimen with melphalan 200 mg/m on 04/22/2019 followed by autologous stem cell infusion on 04/23/2019. Currently he is transfusion independent.  Transfusion criteria with as needed hemoglobin less than 7.5 for platelet less than 10,000.  Transplant course was complicated with  febrile neutropenia grade 3, treated with vancomycin and cephapirin until engraftment on 05/06/2019. Patient also had engraftment syndrome and had to be started on Solu-Medrol 25 mg twice daily on 05/05/2019.  Transition to Medrol Dosepak on 05/07/2019 to complete steroid taper as outpatient.  Patient is currently on acyclovir for 1 year after transplant, dose was reduced on 722 11/20/2018 due to renal function. Patient had a baseline CKD with creatinine running between 1.4-1.6.  He had acute on chronic kidney injury and creatinine went up to 2.2 on 722.  Fluid hydration was given and creatinine decreased to baseline 1.5-1.7. His Hickman catheter was removed.  Candida Cruris treated with topical clotrimazole 1% 3 times daily and to resolution..  #  seen by Dr. Tonia Brooms BMT team on 06/30/2019.revaccination at [facility] begins at 12 months post transplant I discussed with [facility] hematology Dr.Choi and he recommends plans as listed below.  -patient to be started on Ixazomib maintenance: -Ixazomib 77m on D1/D8/D15 out of 28 day cycle.  -patient will start re-vaccination 12 months post transplant.  -Post transplant bone marrow biopsy if clinically indicated.  # seen by [facility] bone marrow transplant team in June 2021.  Patient has been started on immunization protocol.  He reports no new complaints.  #[facility]  Lucent lesion in the L4 spinous process measuring 4 mm which is  nonspecific. Consider confirmation of marrow replacing process with MRI.  2.  Incompletely characterized renal cysts   # He continues to have chronic back pain and neck pain.  [facility] MRI lumbar spine with and without contrast showed no evidence of spinal metastasis.  Lumbar spondylosis is most pronounced  at L5-S1 where there is severe right and moderate left neural foraminal stenosis. 08/11/2020, x-ray cervical spine complete with flexion and extension showed The cervical spine  is visualized from C1 to the top of T1 on the lateral  view.  Reversal of the normal cervical lordosis with a focal kyphosis centered at the C5-C6 intervertebral level. Trace anterolisthesis of C4 onto C5. Trace retrolisthesis of C6 on C7. No evidence of dynamic instability is noted on  the flexion or extension views.   No prevertebral soft tissue swelling. Cervical vertebral body heights are maintained. Multilevel degenerative changes of the cervical spine including discogenic disease, most pronounced at C5-C6 and C6-C7, and facet arthropathy most pronounced at C4-C5. Multilevel mild to moderate left neural foraminal osseous narrowing of the mid to lower cervical vertebrae, possibly centrally/artifactual by patient positioning.  #07/28/2020 bone marrow normocellular marrow with polytypic plasmacytosis, 3 to 8%   Patient is in remission.  Cytogenetics negative for trisomy 12.  Negative myeloma FISH panel.   # Lumbar lesion,  11/13/20 [facility]  MRI cervical sine w/wo contrast - no evidence of metastatic disease C3-4 severe spinal stenosis, left neural foraminal narrowing. C4-5 spinal canal stenosis. [facility] MRI results showed no metastatic spine lesion. Chronic back pain and neck pain, images are consistent with chronic degenerative disease.  Patient continues to follow-up with [facility] orthopedic surgeon.   INTERVAL HISTORY Luis Burke is a 65 y.o. 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 #Patient is currently on ixizomab 3 mg on day 1, day 8, day 15 out of 28 days. He reports feeling well. Patient has intermittent back and neck pain which is chronic.  Patient had MRI neck and the lumbar spine at [facility] in January 2022 and October 2021 with no evidence of bone lesions. He is starting next cycle on 05/03/2021   No nausea vomiting diarrhea.  No new bone pain.    Review of Systems  Constitutional:  Negative for appetite change, chills,  fatigue, fever and unexpected weight change.  HENT:   Negative for hearing loss and voice change.   Eyes:  Negative for eye problems and icterus.  Respiratory:  Negative for chest tightness, cough and shortness of breath.   Cardiovascular:  Positive for leg swelling. Negative for chest pain.  Gastrointestinal:  Negative for abdominal distention and abdominal pain.  Endocrine: Negative for hot flashes.  Genitourinary:  Negative for difficulty urinating, dysuria and frequency.   Musculoskeletal:  Positive for back pain. Negative for arthralgias and neck pain.  Skin:  Negative for itching and rash.  Neurological:  Negative for light-headedness and numbness.  Hematological:  Negative for adenopathy. Does not bruise/bleed easily.  Psychiatric/Behavioral:  Negative for confusion.    MEDICAL HISTORY:  Past Medical History:  Diagnosis Date   AKI (acute kidney injury) ([place]) 01/06/2019   Anemia    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[place])    autologous stem cell bone marrow transplant on 04/23/2019.   Fatty liver    on u/s 07/2018   Hyperlipidemia 03/2004   Hypertension 1994   Multiple myeloma ([place])    Snores     SURGICAL HISTORY: Past Surgical History:  Procedure Laterality Date   CATARACT EXTRACTION W/PHACO Right 03/04/2018   Procedure: CATARACT EXTRACTION PHACO AND INTRAOCULAR LENS PLACEMENT ([place])  RIGHT TORIC;  Surgeon: Leandrew Koyanagi, MD;  Location: [facility];  Service: Ophthalmology;  Laterality: Right;  [place] no Toric Lens 1:45 5.3  COLONOSCOPY WITH PROPOFOL N/A 07/20/2018   Procedure: COLONOSCOPY WITH PROPOFOL;  Surgeon: Jonathon Bellows, MD;  Location: [facility] ENDOSCOPY;  Service: Gastroenterology;  Laterality: N/A;   ESOPHAGOGASTRODUODENOSCOPY (EGD) WITH PROPOFOL N/A 07/20/2018   Procedure: ESOPHAGOGASTRODUODENOSCOPY (EGD) WITH PROPOFOL;  Surgeon: Jonathon Bellows, MD;  Location: [facility] ENDOSCOPY;  Service: Gastroenterology;  Laterality: N/A;   EYE SURGERY Right    HERNIA REPAIR      L Lap herniorraphy  04/2000   Left wrist ganglionectomy      SOCIAL HISTORY: Social History   Socioeconomic History   Marital status: Single    Spouse name: Not on file   Number of children: 1   Years of education: Not on file   Highest education level: Not on file  Occupational History   Occupation: [employer]  Tobacco Use   Smoking status: Never   Smokeless tobacco: Never   Tobacco comments:    Occassionally  Vaping Use   Vaping Use: Never used  Substance and Sexual Activity   Alcohol use: Not Currently    Alcohol/week: 3.0 standard drinks    Types: 3 Cans of beer per week   Drug use: No   Sexual activity: Not on file  Other Topics Concern   Not on file  Social History Narrative   Divorced 28-Dec-2007, dating as of December 27, 2017   His daughter died 4 days after giving birth to the patient's granddaughter   Has joint custody of his dead daughter's child   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 Strain: Not on [employer] Insecurity: Not on file  Transportation Needs: Not on file  Physical Activity: Not on file  Stress: Not on file  Social Connections: Not on file  Intimate Partner Violence: Not on file    FAMILY HISTORY: Family History  Problem Relation Age of Onset   Hypertension Mother    Diabetes Mother    Heart disease Mother        CAD   Dementia Mother    Hypertension Father    Heart disease Father        MI 02/03   Colon cancer Father    Hypertension Sister    Hypertension Sister    Hypertension Sister    Prostate cancer Neg Hx     ALLERGIES:  is allergic to bactrim [sulfamethoxazole-trimethoprim], clonidine derivatives, and tadalafil.  MEDICATIONS:  Current Outpatient Medications  Medication Sig Dispense Refill   acetaminophen (TYLENOL) 325 MG tablet Take 650 mg by mouth every 6 (six) hours as needed.     amLODipine (NORVASC) 5 MG tablet Take 2 tablets (10 mg total) by mouth daily. 90 tablet 3   B  Complex Vitamins (VITAMIN B COMPLEX PO) SMARTSIG:By Mouth     clopidogrel (PLAVIX) 75 MG tablet Take by mouth.     co-enzyme Q-10 50 MG capsule Take 50 mg by mouth daily.     [employer] VITAMIN B12 1000 MCG tablet TAKE 1 TABLET BY MOUTH EVERY DAY 90 tablet 3   cyclobenzaprine (FLEXERIL) 5 MG tablet Take 1 tablet (5 mg total) by mouth 3 (three) times daily as needed for muscle spasms. 30 tablet 0   hydrALAZINE (APRESOLINE) 10 MG tablet TAKE 1 TABLET (10 MG TOTAL) BY MOUTH 3 (THREE) TIMES DAILY. 270 tablet 1   ixazomib citrate (NINLARO) 3 MG capsule Take 1 capsule (3 mg) by mouth weekly, 3 weeks on, 1 week off, repeat every 4 weeks. Take on an empty  stomach 1hr before or 2hr after meals. 3 capsule 3   lisinopril (ZESTRIL) 20 MG tablet TAKE 1 TABLET BY MOUTH EVERY DAY. PLEASE MAKE APPOINTMENT FOR FURTHER REFILLS. 90 tablet 0   LORazepam (ATIVAN) 1 MG tablet TAKE 1 TABLET (1 MG TOTAL) BY MOUTH EVERY 6 (SIX) HOURS AS NEEDED FOR ANXIETY (FOR NAUSEA)     magnesium chloride (SLOW-MAG) 64 MG TBEC SR tablet Take 1 tablet (64 mg total) by mouth daily. 60 tablet 0   metoprolol succinate (TOPROL-XL) 50 MG 24 hr tablet TAKE 4 TABLETS (200 MG TOTAL) BY MOUTH DAILY. TAKE WITH OR IMMEDIATELY FOLLOWING A MEAL. 360 tablet 2   ondansetron (ZOFRAN) 8 MG tablet Take 1 tablet (8 mg total) by mouth every 8 (eight) hours as needed. for nausea 30 tablet 1   pravastatin (PRAVACHOL) 10 MG tablet TAKE 1 TABLET BY MOUTH EVERY DAY 90 tablet 3   zinc gluconate 50 MG tablet Take 50 mg by mouth daily.     acyclovir (ZOVIRAX) 400 MG tablet Take 400 mg by mouth 2 (two) times daily. (Patient not taking: No sig reported)     prochlorperazine (COMPAZINE) 10 MG tablet TAKE 1 TABLET (10 MG TOTAL) BY MOUTH EVERY 6 (SIX) HOURS AS NEEDED FOR NAUSEA (Patient not taking: No sig reported)     No current facility-administered medications for this visit.     PHYSICAL EXAMINATION: ECOG PERFORMANCE STATUS: 1 - Symptomatic but completely  ambulatory Vitals:   05/01/21 1022  BP: (!) 147/89  Pulse: 64  Resp: 18  Temp: (!) 97.4 F (36.3 C)  SpO2: 99%   Filed Weights   05/01/21 1019  Weight: 209 lb (94.8 kg)    Physical Exam Constitutional:      General: He is not in acute distress. HENT:     Head: Normocephalic and atraumatic.  Eyes:     General: No scleral icterus. Cardiovascular:     Rate and Rhythm: Normal rate and regular rhythm.     Heart sounds: Normal heart sounds.  Pulmonary:     Effort: Pulmonary effort is normal. No respiratory distress.     Breath sounds: No wheezing.  Abdominal:     General: Bowel sounds are normal. There is no distension.     Palpations: Abdomen is soft.  Musculoskeletal:        General: No deformity. Normal range of motion.     Cervical back: Normal range of motion and neck supple.  Skin:    General: Skin is warm and dry.     Findings: No erythema or rash.  Neurological:     Mental Status: He is alert and oriented to person, place, and time. Mental status is at baseline.     Cranial Nerves: No cranial nerve deficit.     Coordination: Coordination normal.  Psychiatric:        Mood and Affect: Mood normal.     LABORATORY DATA:  I have reviewed the data as listed Lab Results  Component Value Date   WBC 4.0 05/01/2021   HGB 12.9 (L) 05/01/2021   HCT 38.1 (L) 05/01/2021   MCV 99.7 05/01/2021   PLT 58 (L) 05/01/2021   Recent Labs    05/30/20 0944 06/22/20 0847 07/20/20 0844 03/08/21 1320 04/03/21 1055 05/01/21 0952  NA 138 139   < > 134* 137 137  K 4.4 4.5   < > 3.9 4.3 4.2  CL 104 103   < > 99 103 103  CO2 26 26   < >  $'28 26 25  'N$ GLUCOSE 102* 104*   < > 118* 109* 105*  BUN 20 24*   < > $R'21 20 19  'kA$ CREATININE 1.29* 1.55*   < > 1.46* 1.26* 1.39*  CALCIUM 9.2 9.8   < > 9.9 9.2 9.6  GFRNONAA 58* 47*   < > 53* >60 56*  GFRAA >60 54*  --   --   --   --   PROT 7.4 7.6   < > 7.8 7.7 7.7  ALBUMIN 4.4 4.3   < > 4.5 4.5 4.6  AST 23 24   < > $R'26 22 23  'jP$ ALT 24 24   < >  $'29 22 21  'b$ ALKPHOS 66 59   < > 59 55 54  BILITOT 0.8 0.8   < > 0.9 0.9 0.8   < > = values in this interval not displayed.    Iron/TIBC/Ferritin/ %Sat    Component Value Date/Time   IRON 74 07/01/2018 0944   TIBC 250 07/01/2018 0944   FERRITIN 357 07/01/2018 0944   IRONPCTSAT 30 07/01/2018 0944    Lab Results  Component Value Date   TOTALPROTELP 7.1 03/08/2021   ALBUMINELP 4.0 03/08/2021   A1GS 0.2 03/08/2021   A2GS 0.7 03/08/2021   BETS 1.1 03/08/2021   GAMS 1.1 03/08/2021   MSPIKE Not Observed 03/08/2021   SPEI Comment 03/08/2021   Lab Results  Component Value Date   KPAFRELGTCHN 53.8 (H) 04/03/2021   LAMBDASER 14.3 04/03/2021   KAPLAMBRATIO 3.76 (H) 04/03/2021    RADIOGRAPHIC STUDIES: I have personally reviewed the radiological images as listed and agreed with the findings in the report. No results found.    ASSESSMENT & PLAN:  1. Multiple myeloma in remission ([place])   2. Encounter for antineoplastic chemotherapy   3. Thrombocytopenia ([facility])   4. Stage 3a chronic kidney disease ([place])    #Light chain multiple myeloma beta 2 microglobulin 5 and normal albumin.  Stage II.cytogenetics showed normal male chromosome, MDS FISH panel showed trisomy 55- Standard Risk.S/p RVD x 9 and  Autologous bone marrow stem [facility] at [facility] on 04/23/2019. 03/08/2021, light chain ratio 3.01, free kappa light chain 48.8 04/03/2021, like to make sure increased to 3.76.  He has had some fluctuation of the light chain ratio.  Overall, comparing to 62-month ago, light chain ratio has increased. Today's level is pending.  If persistently increase of light chain ratio, consider repeat bone marrow biopsy.  Last bone marrow biopsy showed polytypic plasma cells 3- 8%. Patient has an appointment with [facility] oncology Dr. Maylene Roes tomorrow.  Appreciate input.  Thrombocytopenia, platelet count 58,000. Stool okay to proceed with next cycle of weeks thousand and on 05/03/2021. Repeated CBC in 2  weeks.  chronic kidney disease, creatinine 1. 39.    Encourage oral hydration avoid nephrotoxins.  Post bone marrow transplant care, He has completed 1 year of acyclovir.  Off acyclovir now. Post transplant vaccination at [facility].   #Bone health/osteopenia/multiple myeloma-  Continue Xgeva monthly for up to 2 years, proceed with Xgeva on 05/03/2021 Continue calcium and vitamin D supplementation. Follow up 4 weeks for evaluation prior to starting next cycle of chemotherapy for maintenance.  Earlie Server, MD, PhD Hematology Oncology [place] at [facility] 05/01/2021

## 2021-05-02 ENCOUNTER — Telehealth: Payer: Self-pay

## 2021-05-02 ENCOUNTER — Other Ambulatory Visit: Payer: Self-pay

## 2021-05-02 DIAGNOSIS — C9001 Multiple myeloma in remission: Secondary | ICD-10-CM

## 2021-05-02 LAB — PROTEIN ELECTRO, RANDOM URINE
Albumin ELP, Urine: 43.7 %
Alpha-1-Globulin, U: 2.9 %
Alpha-2-Globulin, U: 19.6 %
Beta Globulin, U: 19.9 %
Gamma Globulin, U: 13.9 %
Total Protein, Urine: 26.9 mg/dL

## 2021-05-02 LAB — KAPPA/LAMBDA LIGHT CHAINS
Kappa free light chain: 60.4 mg/L — ABNORMAL HIGH (ref 3.3–19.4)
Kappa, lambda light chain ratio: 3.97 — ABNORMAL HIGH (ref 0.26–1.65)
Lambda free light chains: 15.2 mg/L (ref 5.7–26.3)

## 2021-05-02 NOTE — Telephone Encounter (Signed)
Request for bone marrow biopsy faxed to [facility] scheduling.

## 2021-05-03 ENCOUNTER — Inpatient Hospital Stay: Payer: Medicare Other

## 2021-05-03 ENCOUNTER — Encounter: Payer: Self-pay | Admitting: Oncology

## 2021-05-03 NOTE — Telephone Encounter (Signed)
Patient scheduled for bone marrow bx on 8/2 @ 8:30a with arrival time of 7:30a. He has a labs scheduled at [facility] for CBC check, but it has been cancelled since pt will get this lab done prior to bmbx.  Pt notified of appts & appt changes.

## 2021-05-05 ENCOUNTER — Other Ambulatory Visit: Payer: Self-pay | Admitting: Family Medicine

## 2021-05-07 LAB — MULTIPLE MYELOMA PANEL, SERUM
Albumin SerPl Elph-Mcnc: 4.3 g/dL (ref 2.9–4.4)
Albumin/Glob SerPl: 1.6 (ref 0.7–1.7)
Alpha 1: 0.2 g/dL (ref 0.0–0.4)
Alpha2 Glob SerPl Elph-Mcnc: 0.7 g/dL (ref 0.4–1.0)
B-Globulin SerPl Elph-Mcnc: 0.9 g/dL (ref 0.7–1.3)
Gamma Glob SerPl Elph-Mcnc: 0.9 g/dL (ref 0.4–1.8)
Globulin, Total: 2.7 g/dL (ref 2.2–3.9)
IgA: 215 mg/dL (ref 61–437)
IgG (Immunoglobin G), Serum: 1092 mg/dL (ref 603–1613)
IgM (Immunoglobulin M), Srm: 18 mg/dL — ABNORMAL LOW (ref 20–172)
Total Protein ELP: 7 g/dL (ref 6.0–8.5)

## 2021-05-08 ENCOUNTER — Ambulatory Visit: Payer: Medicare Other

## 2021-05-08 NOTE — Progress Notes (Signed)
Patient on schedule for BMB 05/15/2021, called and spoke with patient on phone with pre procedure instructions given. Made aware to be here @ 0730,NPO after MN prior to procedure and driver post procedure/recovery/discharge. Stated understanding.

## 2021-05-14 ENCOUNTER — Other Ambulatory Visit: Payer: Self-pay | Admitting: Radiology

## 2021-05-14 NOTE — H&P (Signed)
Chief Complaint: Patient was seen in consultation today for bm bx at the request of Yu,Zhou  Referring Physician(s): Yu,Zhou  Supervising Physician: Aletta Edouard  Patient Status: Luis Burke  History of Present Illness: Luis Burke is a 65 y.o. male with PMH listed below, know to IR service for previous bm bx on 07/28/20.  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  Pt has been followed by Dr. Tasia Catchings since 2019, f/u serology work up in July 20202 showed increase of light chain ratio. A repeat bm bx was recommended to the patient for further evaluation and management of the multiple myeloma,  after through discussion and shared decision making, patient decided to proceed with the repeat bm bx.   IR was requested for bm bx.   Patient laying in bed, not in acute distress.  Reports chronic neck and back issues.  Denise headache, fever, chills, shortness of breath, cough, chest pain, abdominal pain, nausea ,vomiting, and bleeding.   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   Fatty liver    on u/s 07/2018   Hyperlipidemia 03/2004   Hypertension 1994   Multiple myeloma ([place])    Snores     Past Surgical History:  Procedure Laterality Date   CATARACT EXTRACTION W/PHACO Right 03/04/2018   Procedure: CATARACT EXTRACTION PHACO AND INTRAOCULAR LENS PLACEMENT ([place])  RIGHT TORIC;  Surgeon: Leandrew Koyanagi, MD;  Location: [facility];  Service: Ophthalmology;  Laterality: Right;  [place] no Toric Lens 1:45 5.3   COLONOSCOPY WITH PROPOFOL N/A 07/20/2018   Procedure: COLONOSCOPY WITH PROPOFOL;  Surgeon: Jonathon Bellows, MD;  Location: [facility] ENDOSCOPY;  Service: Gastroenterology;  Laterality: N/A;   ESOPHAGOGASTRODUODENOSCOPY (EGD) WITH PROPOFOL N/A 07/20/2018    Procedure: ESOPHAGOGASTRODUODENOSCOPY (EGD) WITH PROPOFOL;  Surgeon: Jonathon Bellows, MD;  Location: [facility] ENDOSCOPY;  Service: Gastroenterology;  Laterality: N/A;   EYE SURGERY Right    HERNIA REPAIR     L Lap herniorraphy  04/2000   Left wrist ganglionectomy      Allergies: Bactrim [sulfamethoxazole-trimethoprim], Clonidine derivatives, and Tadalafil  Medications: Prior to Admission medications   Medication Sig Start Date End Date Taking? Authorizing Provider  acetaminophen (TYLENOL) 325 MG tablet Take 650 mg by mouth every 6 (six) hours as needed.    [provider]  acyclovir (ZOVIRAX) 400 MG tablet Take 400 mg by mouth 2 (two) times daily. Patient not taking: No sig reported 02/13/21   [provider]  amLODipine (NORVASC) 5 MG tablet TAKE 2 TABLETS BY MOUTH EVERY DAY 05/07/21   Tonia Ghent, MD  B Complex Vitamins (VITAMIN B COMPLEX PO) SMARTSIG:By Mouth 11/17/19   [provider]  clopidogrel (PLAVIX) 75 MG tablet Take by mouth.    [provider]  co-enzyme Q-10 50 MG capsule Take 50 mg by mouth daily.    [provider]  [facility] VITAMIN B12 1000 MCG tablet TAKE 1 TABLET BY MOUTH EVERY DAY 01/09/21   Tonia Ghent, MD  cyclobenzaprine (FLEXERIL) 5 MG tablet Take 1 tablet (5 mg total) by mouth 3 (three) times daily as needed for muscle spasms. 03/03/19   Earlie Server, MD  hydrALAZINE (APRESOLINE) 10 MG tablet TAKE 1 TABLET (10 MG TOTAL) BY MOUTH 3 (THREE) TIMES DAILY.  02/27/21   Tonia Ghent, MD  ixazomib citrate (NINLARO) 3 MG capsule Take 1 capsule (3 mg) by mouth weekly, 3 weeks on, 1 week off, repeat every 4 weeks. Take on an empty stomach 1hr before or 2hr after meals. 03/14/21   Darl Pikes, RPH-CPP  lisinopril (ZESTRIL) 20 MG tablet TAKE 1 TABLET BY MOUTH EVERY DAY. PLEASE MAKE APPOINTMENT FOR FURTHER REFILLS. 03/20/21   Tonia Ghent, MD  LORazepam (ATIVAN) 1 MG tablet TAKE 1 TABLET (1 MG TOTAL) BY MOUTH EVERY 6 (SIX) HOURS AS NEEDED  FOR ANXIETY (FOR NAUSEA) 04/14/19   [provider]  magnesium chloride (SLOW-MAG) 64 MG TBEC SR tablet Take 1 tablet (64 mg total) by mouth daily. 06/22/19   Earlie Server, MD  metoprolol succinate (TOPROL-XL) 50 MG 24 hr tablet TAKE 4 TABLETS (200 MG TOTAL) BY MOUTH DAILY. TAKE WITH OR IMMEDIATELY FOLLOWING A MEAL. 01/19/21   Tonia Ghent, MD  ondansetron (ZOFRAN) 8 MG tablet Take 1 tablet (8 mg total) by mouth every 8 (eight) hours as needed. for nausea 02/23/21   Borders, Kirt Boys, NP  pravastatin (PRAVACHOL) 10 MG tablet TAKE 1 TABLET BY MOUTH EVERY DAY 09/06/20   Tonia Ghent, MD  prochlorperazine (COMPAZINE) 10 MG tablet TAKE 1 TABLET (10 MG TOTAL) BY MOUTH EVERY 6 (SIX) HOURS AS NEEDED FOR NAUSEA Patient not taking: No sig reported 04/14/19   [provider]  zinc gluconate 50 MG tablet Take 50 mg by mouth daily.    [provider]     Family History  Problem Relation Age of Onset   Hypertension Mother    Diabetes Mother    Heart disease Mother        CAD   Dementia Mother    Hypertension Father    Heart disease Father        MI 02/03   Colon cancer Father    Hypertension Sister    Hypertension Sister    Hypertension Sister    Prostate cancer Neg Hx     Social History   Socioeconomic History   Marital status: Single    Spouse name: Not on file   Number of children: 1   Years of education: Not on file   Highest education level: Not on file  Occupational History   Occupation: [employer]  Tobacco Use   Smoking status: Never   Smokeless tobacco: Never   Tobacco comments:    Occassionally  Vaping Use   Vaping Use: Never used  Substance and Sexual Activity   Alcohol use: Not Currently    Alcohol/week: 3.0 standard drinks    Types: 3 Cans of beer per week   Drug use: No   Sexual activity: Not on file  Other Topics Concern   Not on file  Social History Narrative   Divorced 01/07/08, dating as of 01-06-18   His daughter died 4 days after giving  birth to the patient's granddaughter   Has joint custody of his dead daughter's child   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  All other systems are negative.   Vital  Signs: There were no vitals taken for this visit.  Physical Exam  Vitals and nursing note reviewed.  Constitutional:      General: He is not in acute distress.    Appearance: Normal appearance.  HENT:     Head: Normocephalic and atraumatic.     Mouth/Throat:     Mouth: Mucous membranes are moist.     Pharynx: Oropharynx is clear.  Cardiovascular:     Rate and Rhythm: Normal rate and regular rhythm.     Pulses: Normal pulses.     Heart sounds: Normal heart sounds.  Pulmonary:     Effort: Pulmonary effort is normal.     Breath sounds: Normal breath sounds. No wheezing, rhonchi or rales.  Abdominal:     General: Bowel sounds are normal. There is no distension.     Palpations: Abdomen is soft.  Skin:    General: Skin is warm and dry.  Neurological:     Mental Status: He is alert and oriented to person, place, and time.  Psychiatric:        Mood and Affect: Mood normal.        Behavior: Behavior normal.    MD Evaluation Airway: WNL Heart: WNL Abdomen: WNL Chest/ Lungs: WNL ASA  Classification: 3 Mallampati/Airway Score: One  Imaging: No results found.  Labs:  CBC: Recent Labs    02/08/21 1023 03/08/21 1320 04/03/21 1055 05/01/21 0952  WBC 6.4 4.5 4.3 4.0  HGB 13.5 12.4* 13.2 12.9*  HCT 39.1 36.2* 37.5* 38.1*  PLT 80* 97* 71* 58*    COAGS: No results for input(s): INR, APTT in the last 8760 hours.  BMP: Recent Labs    05/30/20 0944 06/22/20 0847  07/20/20 0844 02/08/21 1023 03/08/21 1320 04/03/21 1055 05/01/21 0952  NA 138 139   < > 139 134* 137 137  K 4.4 4.5   < > 4.5 3.9 4.3 4.2  CL 104 103   < > 105 99 103 103  CO2 26 26   < > 24 28 26 25   GLUCOSE 102* 104*   < > 103* 118* 109* 105*  BUN 20 24*   < > 27* 21 20 19   CALCIUM 9.2 9.8   < > 9.7 9.9 9.2 9.6  CREATININE 1.29* 1.55*   < > 1.49* 1.46* 1.26* 1.39*  GFRNONAA 58* 47*   < > 52* 53* >60 56*  GFRAA >60 54*  --   --   --   --   --    < > = values in this interval not displayed.    LIVER FUNCTION TESTS: Recent Labs    02/08/21 1023 03/08/21 1320 04/03/21 1055 05/01/21 0952  BILITOT 0.7 0.9 0.9 0.8  AST 23 26 22 23   ALT 24 29 22 21   ALKPHOS 65 59 55 54  PROT 7.7 7.8 7.7 7.7  ALBUMIN 4.7 4.5 4.5 4.6    TUMOR MARKERS: No results for input(s): AFPTM, CEA, CA199, CHROMGRNA in the last 8760 hours.  Assessment and Plan: 65 y.o. male w/ hx of multiple myeloma, in need of repeat bm bx for further evaluation and management for respond to the current treatment regimen and/or progression of the disease.   IR was requested for image guided bm bx.  Pt presents to [facility] IR for the procedure today,  NPO since MN.  VS hypertensive, 147/89  CBC w/ diff pending  On Plavix 75 mg qd, no need for d/c per IR AC guideline.  Risks and benefits of bone marrow biopsy and aspiration was discussed with the patient and/or patient's family including, but not limited to bleeding, infection, damage to adjacent structures or low yield requiring additional tests.  All of the questions were answered and there is agreement to proceed.  Consent signed and in chart.   Thank you for this interesting consult.  I greatly enjoyed meeting YOSSI HINCHMAN and look forward to participating in their care.  A copy of this report was sent to the requesting provider on this date.  Electronically Signed: Tera Mater, PA-C 05/15/2021, 8:07 AM   I spent a total of    25 Minutes in face to face in  clinical consultation, greater than 50% of which was counseling/coordinating care for mb bx

## 2021-05-15 ENCOUNTER — Other Ambulatory Visit: Payer: Medicare Other

## 2021-05-15 ENCOUNTER — Ambulatory Visit
Admission: RE | Admit: 2021-05-15 | Discharge: 2021-05-15 | Disposition: A | Discharge status: Home / Self Care | Payer: Medicare Other | Source: Ambulatory Visit | Attending: Oncology | Admitting: Oncology

## 2021-05-15 ENCOUNTER — Telehealth: Payer: Self-pay | Admitting: *Deleted

## 2021-05-15 ENCOUNTER — Other Ambulatory Visit: Payer: Self-pay

## 2021-05-15 DIAGNOSIS — R768 Other specified abnormal immunological findings in serum: Secondary | ICD-10-CM | POA: Diagnosis present

## 2021-05-15 DIAGNOSIS — D696 Thrombocytopenia, unspecified: Secondary | ICD-10-CM | POA: Insufficient documentation

## 2021-05-15 DIAGNOSIS — Z7902 Long term (current) use of antithrombotics/antiplatelets: Secondary | ICD-10-CM | POA: Diagnosis not present

## 2021-05-15 DIAGNOSIS — Z9484 Stem cells transplant status: Secondary | ICD-10-CM | POA: Diagnosis not present

## 2021-05-15 DIAGNOSIS — Z79899 Other long term (current) drug therapy: Secondary | ICD-10-CM | POA: Insufficient documentation

## 2021-05-15 DIAGNOSIS — Z800 Family history of malignant neoplasm of digestive organs: Secondary | ICD-10-CM | POA: Diagnosis not present

## 2021-05-15 DIAGNOSIS — C9001 Multiple myeloma in remission: Secondary | ICD-10-CM | POA: Diagnosis present

## 2021-05-15 DIAGNOSIS — C9002 Multiple myeloma in relapse: Secondary | ICD-10-CM | POA: Insufficient documentation

## 2021-05-15 DIAGNOSIS — M542 Cervicalgia: Secondary | ICD-10-CM | POA: Insufficient documentation

## 2021-05-15 DIAGNOSIS — G8929 Other chronic pain: Secondary | ICD-10-CM | POA: Insufficient documentation

## 2021-05-15 DIAGNOSIS — Z85038 Personal history of other malignant neoplasm of large intestine: Secondary | ICD-10-CM | POA: Insufficient documentation

## 2021-05-15 LAB — CBC WITH DIFFERENTIAL/PLATELET
Abs Immature Granulocytes: 0.02 10*3/uL (ref 0.00–0.07)
Basophils Absolute: 0 10*3/uL (ref 0.0–0.1)
Basophils Relative: 0 %
Eosinophils Absolute: 0 10*3/uL (ref 0.0–0.5)
Eosinophils Relative: 0 %
HCT: 36.5 % — ABNORMAL LOW (ref 39.0–52.0)
Hemoglobin: 12.4 g/dL — ABNORMAL LOW (ref 13.0–17.0)
Immature Granulocytes: 0 %
Lymphocytes Relative: 30 %
Lymphs Abs: 1.5 10*3/uL (ref 0.7–4.0)
MCH: 34.5 pg — ABNORMAL HIGH (ref 26.0–34.0)
MCHC: 34 g/dL (ref 30.0–36.0)
MCV: 101.7 fL — ABNORMAL HIGH (ref 80.0–100.0)
Monocytes Absolute: 0.4 10*3/uL (ref 0.1–1.0)
Monocytes Relative: 8 %
Neutro Abs: 3.1 10*3/uL (ref 1.7–7.7)
Neutrophils Relative %: 62 %
Platelets: 93 10*3/uL — ABNORMAL LOW (ref 150–400)
RBC: 3.59 MIL/uL — ABNORMAL LOW (ref 4.22–5.81)
RDW: 12.9 % (ref 11.5–15.5)
WBC: 5 10*3/uL (ref 4.0–10.5)
nRBC: 0 % (ref 0.0–0.2)

## 2021-05-15 IMAGING — CT CT BIOPSY AND ASPIRATION BONE MARROW
1 of 2 series · 10 of 14 positions shown, 13 images · non-contrast
Comparison: none

CLINICAL DATA: History of multiple myeloma with possible relapse
due to increased light chains. Need for bone marrow biopsy for
further evaluation.

[Series 2: i-spiral 5.0 b30f · axial · 0.98mm/px · z∈[+504,+598]mm · 10 of 35 slices shown, 13 images]
[im 4/35  soft-tissue]
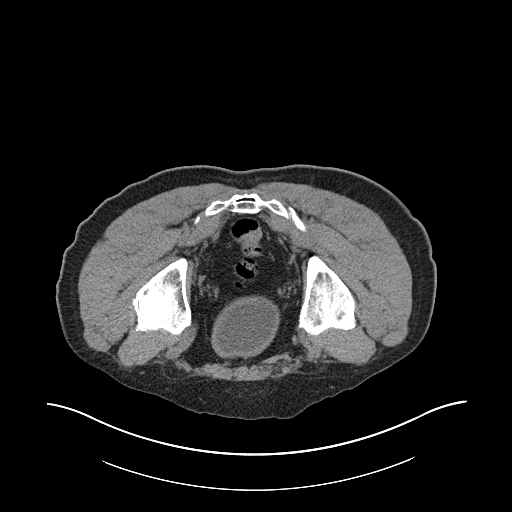
[im 4/35  bone]
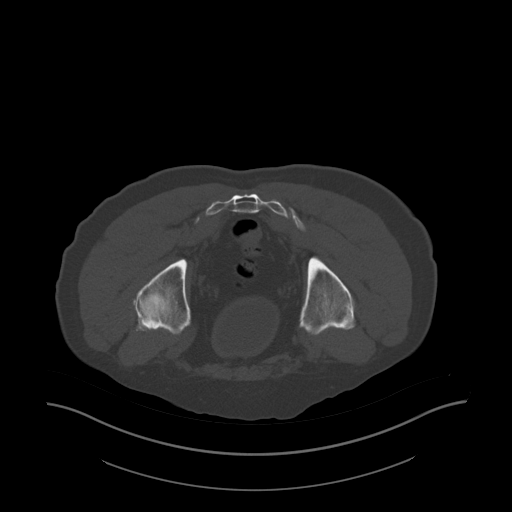
[im 7/35  bone]
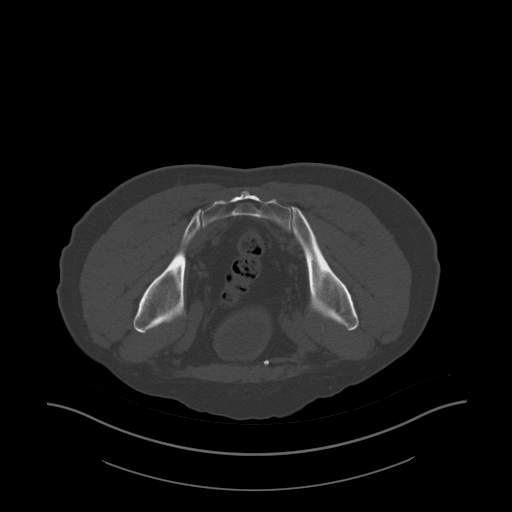
[im 10/35  bone]
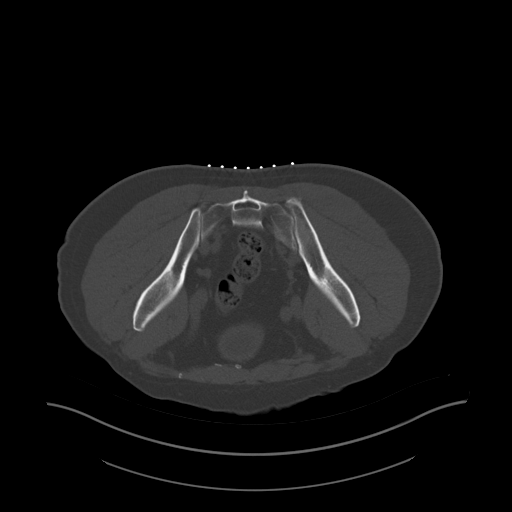
[im 13/35  bone]
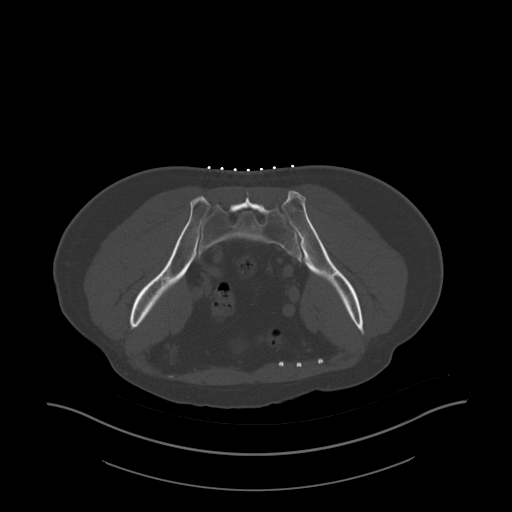
[im 16/35  soft-tissue]
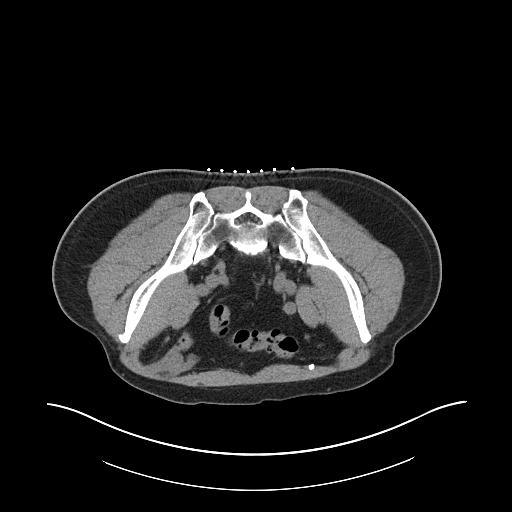
[im 16/35  bone]
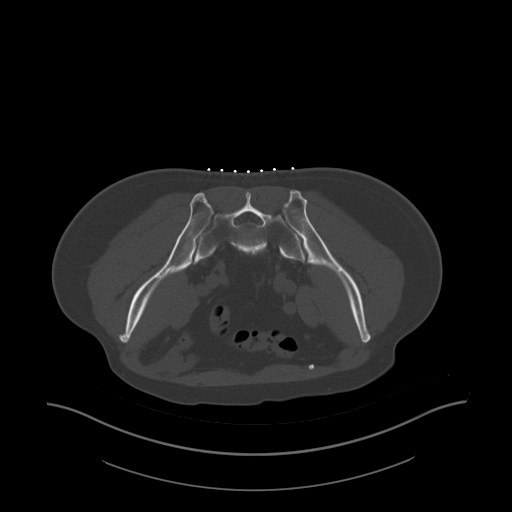
[im 19/35  bone]
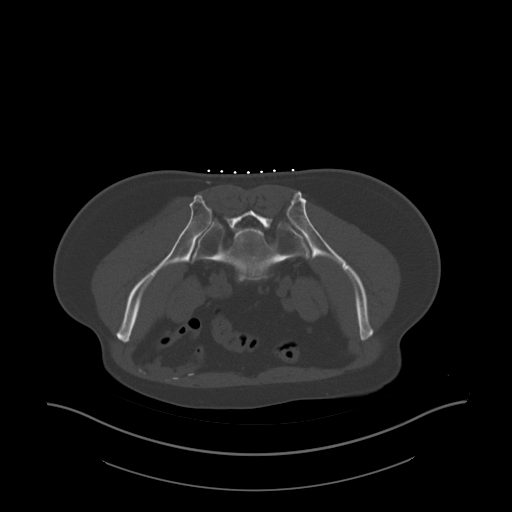
[im 22/35  bone]
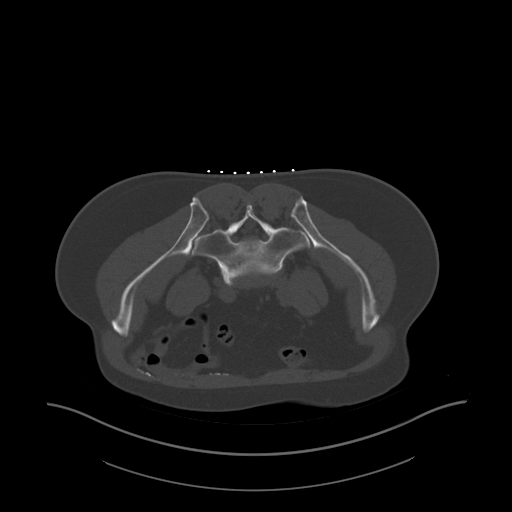
[im 25/35  bone]
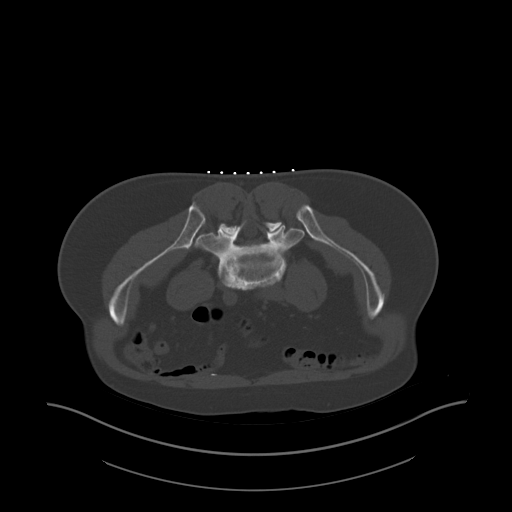
[im 28/35  soft-tissue]
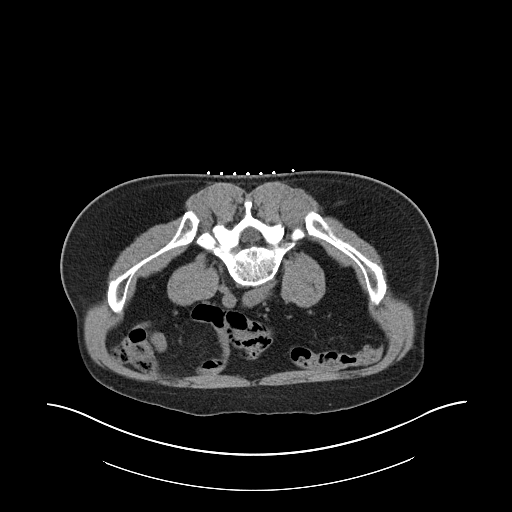
[im 28/35  bone]
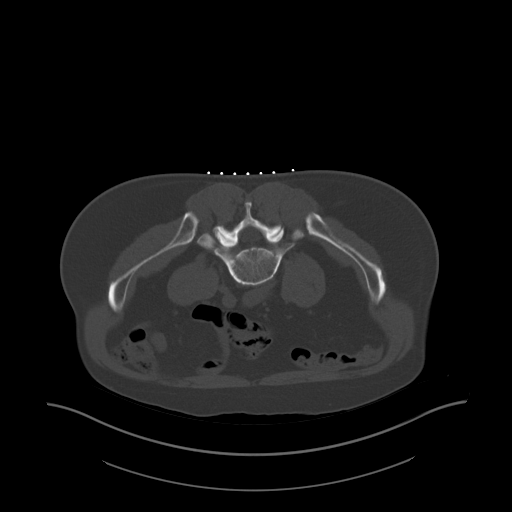
[im 31/35  bone]
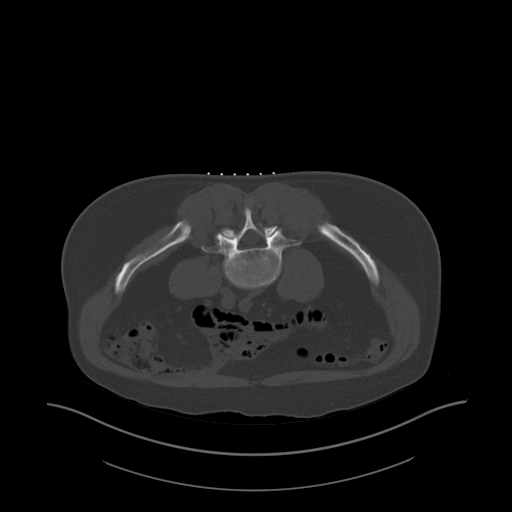

[10 of 14 positions shown; findings below may reference images not displayed]

EXAM:
CT GUIDED BONE MARROW ASPIRATION AND BIOPSY

ANESTHESIA/SEDATION:
Versed 2.0 mg IV, Fentanyl 100 mcg IV

Total Moderate Sedation Time:   13 minutes.

The patient's level of consciousness and physiologic status were
continuously monitored during the procedure by Radiology nursing.

PROCEDURE:
The procedure risks, benefits, and alternatives were explained to
the patient. Questions regarding the procedure were encouraged and
answered. The patient understands and consents to the procedure. A
time out was performed prior to initiating the procedure.

The right gluteal region was prepped with chlorhexidine. Sterile
gown and sterile gloves were used for the procedure. Local
anesthesia was provided with 1% Lidocaine.

Under CT guidance, an 11 gauge On Control bone cutting needle was
advanced from a posterior approach into the right iliac bone. Needle
positioning was confirmed with CT. Initial non heparinized and
heparinized aspirate samples were obtained of bone marrow. Core
biopsy was performed via the On Control drill needle. Two separate
core biopsy samples were obtained.

COMPLICATIONS:
None
FINDINGS: Inspection of initial aspirate did reveal visible particles. Intact
core biopsy samples were obtained.
IMPRESSION: CT guided bone marrow biopsy of right posterior iliac bone with both
aspirate and core samples obtained.

## 2021-05-15 MED ORDER — FENTANYL CITRATE (PF) 100 MCG/2ML IJ SOLN
INTRAMUSCULAR | Status: AC
  Filled 2021-05-15: qty 2

## 2021-05-15 MED ORDER — FENTANYL CITRATE (PF) 100 MCG/2ML IJ SOLN
INTRAMUSCULAR | Status: AC | PRN
  Administered 2021-05-15 (×2): 50 ug via INTRAVENOUS

## 2021-05-15 MED ORDER — MIDAZOLAM HCL 5 MG/5ML IJ SOLN
INTRAMUSCULAR | Status: AC | PRN
  Administered 2021-05-15 (×2): 1 mg via INTRAVENOUS

## 2021-05-15 MED ORDER — MIDAZOLAM HCL 5 MG/5ML IJ SOLN
INTRAMUSCULAR | Status: AC
  Filled 2021-05-15: qty 5

## 2021-05-15 MED ORDER — SODIUM CHLORIDE 0.9 % IV SOLN
INTRAVENOUS | Status: DC
  Administered 2021-05-15: 1000 mL via INTRAVENOUS

## 2021-05-15 MED ORDER — HEPARIN SOD (PORK) LOCK FLUSH 100 UNIT/ML IV SOLN
INTRAVENOUS | Status: AC
  Filled 2021-05-15: qty 5

## 2021-05-15 NOTE — Telephone Encounter (Signed)
Patient called stating that he is getting ready to go on a cruise with his family. Patient wants to know if Dr. Damita Dunnings will prescribe him the patches for sea sickness in case he gets sick. Pharmacy [facility]/[address]

## 2021-05-15 NOTE — Procedures (Signed)
Interventional Radiology Procedure Note  Procedure: CT guided bone marrow aspiration and biopsy  Complications: None  EBL: < 10 mL  Findings: Aspirate and core biopsy performed of bone marrow in right iliac bone.  Plan: Bedrest supine x 1 hrs  Luis Burke, M.D Pager:  [phone]   

## 2021-05-16 MED ORDER — SCOPOLAMINE 1 MG/3DAYS TD PT72: 1.0000 | MEDICATED_PATCH | TRANSDERMAL | 1 refills | Status: DC

## 2021-05-16 NOTE — Telephone Encounter (Signed)
Sent.  If he has dry mouth or gets drowsy with medication then just peel the patch off.  I hope he has a great trip.  Thanks.

## 2021-05-16 NOTE — Telephone Encounter (Signed)
Patient notified rx was sent and advised if he gets any dry mouth or drowsy to peel patch off. Patient verbalized understanding.

## 2021-05-18 ENCOUNTER — Encounter: Payer: Self-pay | Admitting: Oncology

## 2021-05-18 NOTE — Telephone Encounter (Signed)
Please advise on results

## 2021-05-21 ENCOUNTER — Other Ambulatory Visit: Payer: Self-pay | Admitting: Oncology

## 2021-05-21 DIAGNOSIS — C9000 Multiple myeloma not having achieved remission: Secondary | ICD-10-CM

## 2021-05-21 NOTE — Progress Notes (Signed)
DISCONTINUE ON PATHWAY REGIMEN - Multiple Myeloma and Other Plasma Cell Dyscrasias     A cycle is every 28 days:     Ixazomib      Ixazomib   **Always confirm dose/schedule in your pharmacy ordering system**  REASON: Disease Progression PRIOR TREATMENT: [id number]: Ixazomib 3 mg (Cycles 1-4) and 4 mg (Cycles 5 and Beyond) PO Days 1, 8 and 15 q28 Days x 2 Years TREATMENT RESPONSE: Progressive Disease (PD)  START ON PATHWAY REGIMEN - Multiple Myeloma and Other Plasma Cell Dyscrasias     Cycles 1 and 2: A cycle is every 28 days:     Pomalidomide      Dexamethasone      Daratumumab    Cycles 3 through 6: A cycle is every 28 days:     Pomalidomide      Dexamethasone      Dexamethasone      Daratumumab    Cycles 7 and beyond: A cycle is every 28 days:     Pomalidomide      Dexamethasone      Dexamethasone      Daratumumab   **Always confirm dose/schedule in your pharmacy ordering system**  Patient Characteristics: Multiple Myeloma, Relapsed / Refractory, Second through Fourth Lines of Therapy, Fit or Candidate for Triplet Therapy, Lenalidomide-Refractory or Lenalidomide-based Regimen Not Preferred, Candidate for Anti-CD38 Antibody Disease Classification: Multiple Myeloma R-ISS Staging: II Therapeutic Status: Relapsed Line of Therapy: Second Line Anti-CD38 Antibody Candidacy: Candidate for Anti-CD38 Antibody Lenalidomide-based Regimen Preference/Candidacy: Lenalidomide-based Regimen Not Preferred Intent of Therapy: Non-Curative / Palliative Intent, Discussed with Patient

## 2021-05-21 NOTE — Progress Notes (Signed)
ON PATHWAY REGIMEN - Multiple Myeloma and Other Plasma Cell Dyscrasias  No Change  Continue With Treatment as Ordered.  Original Decision Date/Time: 07/21/2019 12:03     A cycle is every 28 days:     Ixazomib      Ixazomib   **Always confirm dose/schedule in your pharmacy ordering system**  Patient Characteristics: Maintenance Therapy R-ISS Staging: II Disease Classification: Maintenance Therapy Intent of Therapy: Non-Curative / Palliative Intent, Discussed with Patient

## 2021-05-22 ENCOUNTER — Telehealth: Payer: Self-pay

## 2021-05-22 ENCOUNTER — Other Ambulatory Visit: Payer: Self-pay | Admitting: Oncology

## 2021-05-22 ENCOUNTER — Telehealth: Payer: Self-pay | Admitting: Pharmacist

## 2021-05-22 DIAGNOSIS — C9000 Multiple myeloma not having achieved remission: Secondary | ICD-10-CM

## 2021-05-22 MED ORDER — POMALIDOMIDE 2 MG PO CAPS: 2.0000 mg | ORAL_CAPSULE | Freq: Every day | ORAL | 0 refills | Status: DC

## 2021-05-22 NOTE — Telephone Encounter (Signed)
Please add daratumumab *new* to appt on 8/16. Thanks

## 2021-05-22 NOTE — Telephone Encounter (Signed)
Oral Oncology Pharmacist Encounter  Received new prescription for Pomalyst (pomalidomide) for the treatment of recurrent multiple myeloma in conjunction with daratumumab and dexamethasone, planned duration until disease control or unacceptable drug toxicity.  CMP from 05/01/21 assessed, no relevant lab abnormalities. Prescription dose and frequency assessed.   Current medication list in Epic reviewed, no relevant DDIs with pomalidomide identified.  Evaluated chart and no patient barriers to medication adherence identified.   Prescription has been e-scribed to the [facility] for benefits analysis and approval.  Oral Oncology Clinic will continue to follow for insurance authorization, copayment issues, initial counseling and start date.   Darl Pikes, PharmD, BCPS, BCOP, CPP Hematology/Oncology Clinical Pharmacist Practitioner [facility]/HP/AP [facility] [phone]  05/22/2021 12:45 PM

## 2021-05-22 NOTE — Telephone Encounter (Signed)
-----   Message from Earlie Server, MD sent at 05/22/2021 12:27 PM EDT ----- His Bone marrow showed early relapse. My plan is to stop his current treatment, and switch to Dara-Pd.  Alyson and Bethena Roys, please work on [employer], I will plan to start with '2mg'$  Benjamine Mola, his appt is on 8/16, lab md daratumumab.   Patient is aware about this.

## 2021-05-23 ENCOUNTER — Encounter: Payer: Self-pay | Admitting: Oncology

## 2021-05-23 NOTE — Telephone Encounter (Signed)
Left message notifying patient that his appointments on 8/16 will remain however I had to schedule is infusion for 8/18 due the the infusion time.

## 2021-05-24 ENCOUNTER — Telehealth: Payer: Self-pay | Admitting: Pharmacy Technician

## 2021-05-24 ENCOUNTER — Other Ambulatory Visit (HOSPITAL_COMMUNITY): Payer: Self-pay

## 2021-05-24 ENCOUNTER — Encounter (HOSPITAL_COMMUNITY): Payer: Self-pay | Admitting: Oncology

## 2021-05-24 MED ORDER — POMALIDOMIDE 2 MG PO CAPS: 2.0000 mg | ORAL_CAPSULE | Freq: Every day | ORAL | 0 refills | Status: DC

## 2021-05-24 NOTE — Progress Notes (Signed)
Pharmacist Chemotherapy Monitoring - Initial Assessment    Anticipated start date: 05/31/21   The following has been reviewed per standard work regarding the patient's treatment regimen: The patient's diagnosis, treatment plan and drug doses, and organ/hematologic function Lab orders and baseline tests specific to treatment regimen  The treatment plan start date, drug sequencing, and pre-medications Prior authorization status  Patient's documented medication list, including drug-drug interaction screen and prescriptions for anti-emetics and supportive care specific to the treatment regimen The drug concentrations, fluid compatibility, administration routes, and timing of the medications to be used The patient's access for treatment and lifetime cumulative dose history, if applicable  The patient's medication allergies and previous infusion related reactions, if applicable   Changes made to treatment plan:  treatment plan date  Follow up needed:  signing treatment plan Need RBC phenotype and Type and [place], [place], 05/24/2021  3:29 PM

## 2021-05-24 NOTE — Telephone Encounter (Signed)
Oral Oncology Patient Advocate Encounter  Prior Authorization for Pomalyst has been approved.    PA# [id number] Effective dates: 05/23/21 through 10/13/21  Patients co-pay is $1062.44  Oral Oncology Clinic will continue to follow.   [place] Patient [place] Phone [phone] Fax [phone] 05/24/2021 12:32 PM

## 2021-05-24 NOTE — Telephone Encounter (Signed)
Oral Oncology Patient Advocate Encounter   Received notification from [facility] that prior authorization for Pomalyst is required.   PA submitted on CoverMyMeds Key [id number] Status is pending   Oral Oncology Clinic will continue to follow.  [place] Patient [place] Phone [phone] Fax [phone] 05/24/2021 12:26 PM

## 2021-05-29 ENCOUNTER — Inpatient Hospital Stay (HOSPITAL_BASED_OUTPATIENT_CLINIC_OR_DEPARTMENT_OTHER): Payer: Medicare Other | Admitting: Oncology

## 2021-05-29 ENCOUNTER — Encounter: Payer: Self-pay | Admitting: Oncology

## 2021-05-29 ENCOUNTER — Inpatient Hospital Stay: Payer: Medicare Other | Admitting: Pharmacist

## 2021-05-29 ENCOUNTER — Inpatient Hospital Stay: Payer: Medicare Other | Attending: Oncology

## 2021-05-29 VITALS — BP 140/88 | HR 57 | Temp 97.7°F | Resp 18 | Wt 204.9 lb

## 2021-05-29 DIAGNOSIS — D539 Nutritional anemia, unspecified: Secondary | ICD-10-CM | POA: Insufficient documentation

## 2021-05-29 DIAGNOSIS — N1831 Chronic kidney disease, stage 3a: Secondary | ICD-10-CM | POA: Diagnosis not present

## 2021-05-29 DIAGNOSIS — Z79899 Other long term (current) drug therapy: Secondary | ICD-10-CM | POA: Insufficient documentation

## 2021-05-29 DIAGNOSIS — M8580 Other specified disorders of bone density and structure, unspecified site: Secondary | ICD-10-CM | POA: Insufficient documentation

## 2021-05-29 DIAGNOSIS — Z7982 Long term (current) use of aspirin: Secondary | ICD-10-CM | POA: Diagnosis not present

## 2021-05-29 DIAGNOSIS — I129 Hypertensive chronic kidney disease with stage 1 through stage 4 chronic kidney disease, or unspecified chronic kidney disease: Secondary | ICD-10-CM | POA: Diagnosis not present

## 2021-05-29 DIAGNOSIS — C9002 Multiple myeloma in relapse: Secondary | ICD-10-CM | POA: Insufficient documentation

## 2021-05-29 DIAGNOSIS — C9000 Multiple myeloma not having achieved remission: Secondary | ICD-10-CM

## 2021-05-29 DIAGNOSIS — D696 Thrombocytopenia, unspecified: Secondary | ICD-10-CM | POA: Insufficient documentation

## 2021-05-29 DIAGNOSIS — Z7952 Long term (current) use of systemic steroids: Secondary | ICD-10-CM | POA: Diagnosis not present

## 2021-05-29 DIAGNOSIS — Z5112 Encounter for antineoplastic immunotherapy: Secondary | ICD-10-CM | POA: Insufficient documentation

## 2021-05-29 DIAGNOSIS — Z5111 Encounter for antineoplastic chemotherapy: Secondary | ICD-10-CM

## 2021-05-29 DIAGNOSIS — Z9481 Bone marrow transplant status: Secondary | ICD-10-CM | POA: Insufficient documentation

## 2021-05-29 DIAGNOSIS — C9001 Multiple myeloma in remission: Secondary | ICD-10-CM

## 2021-05-29 LAB — COMPREHENSIVE METABOLIC PANEL
ALT: 29 U/L (ref 0–44)
AST: 22 U/L (ref 15–41)
Albumin: 4.3 g/dL (ref 3.5–5.0)
Alkaline Phosphatase: 51 U/L (ref 38–126)
Anion gap: 7 (ref 5–15)
BUN: 26 mg/dL — ABNORMAL HIGH (ref 8–23)
CO2: 29 mmol/L (ref 22–32)
Calcium: 9.7 mg/dL (ref 8.9–10.3)
Chloride: 103 mmol/L (ref 98–111)
Creatinine, Ser: 1.49 mg/dL — ABNORMAL HIGH (ref 0.61–1.24)
GFR, Estimated: 52 mL/min — ABNORMAL LOW (ref >60)
Glucose, Bld: 107 mg/dL — ABNORMAL HIGH (ref 70–99)
Potassium: 4.7 mmol/L (ref 3.5–5.1)
Sodium: 139 mmol/L (ref 135–145)
Total Bilirubin: 1 mg/dL (ref 0.3–1.2)
Total Protein: 7.4 g/dL (ref 6.5–8.1)

## 2021-05-29 LAB — CBC WITH DIFFERENTIAL/PLATELET
Abs Immature Granulocytes: 0.02 10*3/uL (ref 0.00–0.07)
Basophils Absolute: 0 10*3/uL (ref 0.0–0.1)
Basophils Relative: 0 %
Eosinophils Absolute: 0.1 10*3/uL (ref 0.0–0.5)
Eosinophils Relative: 1 %
HCT: 37.5 % — ABNORMAL LOW (ref 39.0–52.0)
Hemoglobin: 12.4 g/dL — ABNORMAL LOW (ref 13.0–17.0)
Immature Granulocytes: 0 %
Lymphocytes Relative: 33 %
Lymphs Abs: 1.5 10*3/uL (ref 0.7–4.0)
MCH: 34.3 pg — ABNORMAL HIGH (ref 26.0–34.0)
MCHC: 33.1 g/dL (ref 30.0–36.0)
MCV: 103.6 fL — ABNORMAL HIGH (ref 80.0–100.0)
Monocytes Absolute: 0.5 10*3/uL (ref 0.1–1.0)
Monocytes Relative: 11 %
Neutro Abs: 2.6 10*3/uL (ref 1.7–7.7)
Neutrophils Relative %: 55 %
Platelets: 84 10*3/uL — ABNORMAL LOW (ref 150–400)
RBC: 3.62 MIL/uL — ABNORMAL LOW (ref 4.22–5.81)
RDW: 13.5 % (ref 11.5–15.5)
WBC: 4.7 10*3/uL (ref 4.0–10.5)
nRBC: 0 % (ref 0.0–0.2)

## 2021-05-29 LAB — SURGICAL PATHOLOGY

## 2021-05-29 LAB — ANTIBODY SCREEN: Antibody Screen: NEGATIVE

## 2021-05-29 LAB — ABO/RH: ABO/RH(D): O NEG

## 2021-05-29 MED ORDER — MONTELUKAST SODIUM 10 MG PO TABS: 10.0000 mg | ORAL_TABLET | ORAL | 0 refills | Status: DC

## 2021-05-29 MED ORDER — ACETAMINOPHEN 325 MG PO TABS: 650.0000 mg | ORAL_TABLET | ORAL | 0 refills | Status: AC

## 2021-05-29 MED ORDER — DIPHENHYDRAMINE HCL 50 MG PO TABS: 50.0000 mg | ORAL_TABLET | ORAL | 0 refills | Status: DC

## 2021-05-29 NOTE — Progress Notes (Signed)
Pt here for follow up. No new concerns voiced.   

## 2021-05-29 NOTE — Progress Notes (Signed)
[facility]  [phone] [phone]  Patient Care Team: Tonia Ghent, MD as PCP - General (Family Medicine) Earlie Server, MD as Consulting Physician (Oncology) Beather Arbour Lilyan Punt, MD as Referring Physician (Hematology and Oncology) Diannia Ruder, MD as Referring Physician (Hematology and Oncology)   Name of the patient: Luis Burke  [id number]  1956-06-08   Date of visit: 05/29/21  HPI: Patient is a 65 y.o. male with relapsed multiple myeloma s/p auto transplant in 2020. He was on ixazomib for maintenance therapy post transplant. Planned treatment with Pomalyst (pomalidomide), dartumumab, and dexamethasone.   Reason for Consult: Pomalyst (pomalidomide) and daratumumab chemotherapy education.   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   Fatty liver    on u/s 07/2018   Hyperlipidemia 03/2004   Hypertension 1994   Multiple myeloma ([place])    Snores     HEMATOLOGY/ONCOLOGY HISTORY:  Oncology History  Multiple myeloma ([place])  08/26/2018 Initial Diagnosis   Multiple myeloma ([place])   08/28/2018 - 03/17/2019 Chemotherapy   The patient had dexamethasone (DECADRON) 4 MG tablet, 1 of 1 cycle, Start date: --, End date: -- bortezomib SQ (VELCADE) chemo injection 2.75 mg, 1.3 mg/m2 = 2.75 mg, Subcutaneous,  Once, 10 of 10 cycles Administration: 2.75 mg (08/28/2018), 2.75 mg (09/04/2018), 2.75 mg (09/15/2018), 2.75 mg (09/22/2018), 2.75 mg (09/29/2018), 2.75 mg (10/06/2018), 2.75 mg (10/13/2018), 2.75 mg (10/20/2018), 2.75 mg (10/27/2018), 2.75 mg (11/03/2018), 2.75 mg (11/10/2018), 2.75 mg (11/17/2018), 2.75 mg (11/24/2018), 2.75 mg (12/01/2018), 2.75 mg (12/08/2018), 2.75 mg (12/16/2018), 2.75 mg (12/23/2018), 2.75 mg (12/30/2018), 2.75 mg (01/06/2019), 2.75 mg  (01/13/2019), 2.75 mg (01/20/2019), 2.75 mg (01/27/2019), 2.75 mg (02/03/2019), 2.75 mg (02/10/2019), 2.75 mg (02/17/2019), 2.75 mg (02/24/2019), 2.75 mg (03/03/2019), 2.75 mg (03/10/2019), 2.75 mg (03/17/2019)   for chemotherapy treatment.     07/21/2019 - 07/21/2019 Chemotherapy          05/31/2021 -  Chemotherapy    Patient is on Treatment Plan: MYELOMA DARATUMUMAB + POMALIDOMIDE + DEXAMETHASONE Q28D X 7 CYCLES         ALLERGIES:  is allergic to bactrim [sulfamethoxazole-trimethoprim], clonidine derivatives, and tadalafil.  MEDICATIONS:  Current Outpatient Medications  Medication Sig Dispense Refill   acetaminophen (TYLENOL) 325 MG tablet Take 650 mg by mouth every 6 (six) hours as needed.     acyclovir (ZOVIRAX) 400 MG tablet Take 400 mg by mouth 2 (two) times daily. (Patient not taking: No sig reported)     amLODipine (NORVASC) 5 MG tablet TAKE 2 TABLETS BY MOUTH EVERY DAY 180 tablet 1   B Complex Vitamins (VITAMIN B COMPLEX PO) SMARTSIG:By Mouth     clopidogrel (PLAVIX) 75 MG tablet Take by mouth.     co-enzyme Q-10 50 MG capsule Take 50 mg by mouth daily.     [employer] VITAMIN B12 1000 MCG tablet TAKE 1 TABLET BY MOUTH EVERY DAY 90 tablet 3   cyclobenzaprine (FLEXERIL) 5 MG tablet Take 1 tablet (5 mg total) by mouth 3 (three) times daily as needed for muscle spasms. (Patient not taking: Reported on 05/29/2021) 30 tablet 0   hydrALAZINE (APRESOLINE) 10 MG tablet TAKE 1 TABLET (10 MG TOTAL) BY MOUTH 3 (THREE) TIMES DAILY. 270 tablet 1   lisinopril (ZESTRIL) 20 MG tablet TAKE 1 TABLET BY MOUTH  EVERY DAY. PLEASE MAKE APPOINTMENT FOR FURTHER REFILLS. 90 tablet 0   LORazepam (ATIVAN) 1 MG tablet TAKE 1 TABLET (1 MG TOTAL) BY MOUTH EVERY 6 (SIX) HOURS AS NEEDED FOR ANXIETY (FOR NAUSEA)     magnesium chloride (SLOW-MAG) 64 MG TBEC SR tablet Take 1 tablet (64 mg total) by mouth daily. 60 tablet 0   metoprolol succinate (TOPROL-XL) 50 MG 24 hr tablet TAKE 4 TABLETS (200 MG TOTAL) BY MOUTH DAILY. TAKE WITH  OR IMMEDIATELY FOLLOWING A MEAL. 360 tablet 2   ondansetron (ZOFRAN) 8 MG tablet Take 1 tablet (8 mg total) by mouth every 8 (eight) hours as needed. for nausea 30 tablet 1   pomalidomide (POMALYST) 2 MG capsule Take 1 capsule (2 mg total) by mouth daily. Take for 21 days on, then hold for 7 days. Repeat every 28 days. (Patient not taking: Reported on 05/29/2021) 21 capsule 0   pravastatin (PRAVACHOL) 10 MG tablet TAKE 1 TABLET BY MOUTH EVERY DAY 90 tablet 3   prochlorperazine (COMPAZINE) 10 MG tablet TAKE 1 TABLET (10 MG TOTAL) BY MOUTH EVERY 6 (SIX) HOURS AS NEEDED FOR NAUSEA (Patient not taking: No sig reported)     scopolamine (TRANSDERM-SCOP, 1.5 MG,) 1 MG/3DAYS Place 1 patch (1.5 mg total) onto the skin every 3 (three) days. (Patient not taking: Reported on 05/29/2021) 4 patch 1   zinc gluconate 50 MG tablet Take 50 mg by mouth daily.     No current facility-administered medications for this visit.    VITAL SIGNS: There were no vitals taken for this visit. There were no vitals filed for this visit.  Estimated body mass index is 29.4 kg/m as calculated from the following:   Height as of 05/15/21: 5' 10"  (1.778 m).   Weight as of an earlier encounter on 05/29/21: 92.9 kg (204 lb 14.4 oz).  LABS: CBC:    Component Value Date/Time   WBC 4.7 05/29/2021 0901   HGB 12.4 (L) 05/29/2021 0901   HCT 37.5 (L) 05/29/2021 0901   PLT 84 (L) 05/29/2021 0901   MCV 103.6 (H) 05/29/2021 0901   NEUTROABS 2.6 05/29/2021 0901   LYMPHSABS 1.5 05/29/2021 0901   MONOABS 0.5 05/29/2021 0901   EOSABS 0.1 05/29/2021 0901   BASOSABS 0.0 05/29/2021 0901   Comprehensive Metabolic Panel:    Component Value Date/Time   NA 139 05/29/2021 0901   K 4.7 05/29/2021 0901   CL 103 05/29/2021 0901   CO2 29 05/29/2021 0901   BUN 26 (H) 05/29/2021 0901   CREATININE 1.49 (H) 05/29/2021 0901   GLUCOSE 107 (H) 05/29/2021 0901   CALCIUM 9.7 05/29/2021 0901   AST 22 05/29/2021 0901   ALT 29 05/29/2021 0901   ALKPHOS  51 05/29/2021 0901   BILITOT 1.0 05/29/2021 0901   PROT 7.4 05/29/2021 0901   ALBUMIN 4.3 05/29/2021 0901     Present during today's visit: Patient only  Assessment and Plan: Start plan: Patient will start IV and oral treatment on 05/31/21.   Patient Education I spoke with patient for overview of new oral chemotherapy medication: pomalidomide.  Administration: Counseled patient on administration, dosing, side effects, monitoring, drug-food interactions, safe handling, storage, and disposal. Patient will take 1 capsule (2 mg total) by mouth daily. Take for 21 days on, then hold for 7 days. Repeat every 28 days.  Patient instructed to re-start aspirin 81 mg daily and acyclovir 400 mg BID.   Side Effects: Side effects include but not limited to: fatigue, decreased WBC/Hgb/PLT,  shortness of breath, constipation, diarrhea, back pain, nausea/vomiting.   Diarrhea; pt reported he had diarrhea with previous Revlimid therapy. Recommended that he have Immodium on hand Fatigue; recommended that he keep active when possible Infection; pt knows to call [facility] for T > 100.4 F or if feeling unwell  Drug-drug Interactions (DDI): No relevant drug-drug interactions  Daratumumab: Discussed allergic reactions and need for premedications prior to infusion. During 1st dose, patient will receive IV daratumumab and receive pre-medications in clinic. Subsequent doses of daratumumab will likely be SQ shot. For subsequent doses, patient counseled and instructed to take premedications at home prior to infusion visit. Discussed fatigue as stated above. Reviewed dosing schedule with patient.   Adherence: After discussion with patient no patient barriers to medication adherence identified. Patient provided with administration calendar.  Reviewed with patient importance of keeping a medication schedule and plan for any missed doses.  Mr. Schwan voiced understanding and appreciation. All questions answered.  Medication handout provided.  Provided patient with [facility] phone number. Patient knows to call the office with questions or concerns. [facility] will continue to follow.  Patient expressed understanding and was in agreement with this plan. He also understands that He can call clinic at any time with any questions, concerns, or complaints.   Medication Access Issues: No issues. [facility] delivered medication to patient's home today (05/29/21).  Follow-up plan: F/u with patient during infusion appointment on 06/07/21.  Thank you for allowing me to participate in the care of this patient.   Time Total: 30 minutes  Visit consisted of counseling and education on dealing with issues of symptom management in the setting of serious and potentially life-threatening illness.Greater than 50%  of this time was spent counseling and coordinating care related to the above assessment and plan.  Patient seen in a shared visit with Elita Quick, PharmD   Signed by: Darl Pikes, PharmD, BCPS, Salley Slaughter, CPP Hematology/Oncology Clinical Pharmacist Practitioner [facility] [phone]  05/29/2021 3:03 PM

## 2021-05-29 NOTE — Progress Notes (Signed)
Hematology/Oncology  Follow up note [facility] Telephone:([phone]) [phone] [phone]) [phone]   Patient Care Team: Tonia Ghent, MD as PCP - General (Family Medicine) Earlie Server, MD as Consulting Physician (Oncology) Beather Arbour Lilyan Punt, MD as Referring Physician (Hematology and Oncology) Diannia Ruder, MD as Referring Physician (Hematology and Oncology)  REASON FOR VISIT Follow up for multiple myeloma.   HISTORY OF PRESENTING ILLNESS:  Luis Burke is a  65 y.o.  male with PMH listed below presents for follow up of multiple myeloma.  # 07/27/2018 multiple myeloma panel showed M protein of 0.1, IgG 662, IgA 49, IgM 7. Patient was called back to further labs done. 08/10/2018, free light chain ratio showed extremely high level of kappa free light chain 10,183, with a kappa lambda light chain ratio of 1414.31 LDH 164 Beta-2 microglobulin 5 Patient was called and discuss about results.  He was recommended to undergo bone marrow biopsy and PET scan. 08/19/2018 bone marrow biopsy showed hypercellular marrow 80%, involved by plasma cell neoplasm up to 95%.  Consistent with plasma cell myeloma. Myeloma FISH  gain of gain of CEP12  09/10/2018 skeletal survey showed questionable small lucent lesions within the midshaft of the humerus bilaterally.  Otherwise no suspicious focal lytic lesion or acute bone abnormality. 08/25/2018 PET scan showed no definite hypermetabolic bone disease but CT findings are highly suspicious for numerous small myelomatous lesions involving the spine, sternum and scattered ribs.  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 He received preparative regimen with melphalan 200 mg/m on 04/22/2019 followed by autologous stem cell infusion on 04/23/2019. Currently he is transfusion independent.  Transfusion criteria with as needed hemoglobin less than 7.5 for platelet less than 10,000.  Transplant course was complicated with  febrile neutropenia grade 3, treated with vancomycin and cephapirin until engraftment on 05/06/2019. Patient also had engraftment syndrome and had to be started on Solu-Medrol 25 mg twice daily on 05/05/2019.  Transition to Medrol Dosepak on 05/07/2019 to complete steroid taper as outpatient.  Patient is currently on acyclovir for 1 year after transplant, dose was reduced on 722 11/20/2018 due to renal function. Patient had a baseline CKD with creatinine running between 1.4-1.6.  He had acute on chronic kidney injury and creatinine went up to 2.2 on 722.  Fluid hydration was given and creatinine decreased to baseline 1.5-1.7. His Hickman catheter was removed.  Candida Cruris treated with topical clotrimazole 1% 3 times daily and to resolution..  #  seen by Dr. Tonia Brooms BMT team on 06/30/2019.revaccination at [facility] begins at 12 months post transplant I discussed with [facility] hematology Dr.Choi and he recommends plans as listed below.  -patient to be started on Ixazomib maintenance: -Ixazomib 77m on D1/D8/D15 out of 28 day cycle.  -patient will start re-vaccination 12 months post transplant.  -Post transplant bone marrow biopsy if clinically indicated.  # seen by [facility] bone marrow transplant team in June 2021.  Patient has been started on immunization protocol.  He reports no new complaints.  #[facility]  Lucent lesion in the L4 spinous process measuring 4 mm which is  nonspecific. Consider confirmation of marrow replacing process with MRI.  2.  Incompletely characterized renal cysts   # He continues to have chronic back pain and neck pain.  [facility] MRI lumbar spine with and without contrast showed no evidence of spinal metastasis.  Lumbar spondylosis is most pronounced  at L5-S1 where there is severe right and moderate left neural foraminal stenosis. 08/11/2020, x-ray cervical spine complete with flexion and extension showed The cervical spine  is visualized from C1 to the top of T1 on the lateral  view.  Reversal of the normal cervical lordosis with a focal kyphosis centered at the C5-C6 intervertebral level. Trace anterolisthesis of C4 onto C5. Trace retrolisthesis of C6 on C7. No evidence of dynamic instability is noted on  the flexion or extension views.   No prevertebral soft tissue swelling. Cervical vertebral body heights are maintained. Multilevel degenerative changes of the cervical spine including discogenic disease, most pronounced at C5-C6 and C6-C7, and facet arthropathy most pronounced at C4-C5. Multilevel mild to moderate left neural foraminal osseous narrowing of the mid to lower cervical vertebrae, possibly centrally/artifactual by patient positioning.  #07/28/2020 bone marrow normocellular marrow with polytypic plasmacytosis, 3 to 8%   Patient is in remission.  Cytogenetics negative for trisomy 12.  Negative myeloma FISH panel.   # Lumbar lesion,  11/13/20 [facility]  MRI cervical sine w/wo contrast - no evidence of metastatic disease C3-4 severe spinal stenosis, left neural foraminal narrowing. C4-5 spinal canal stenosis. [facility] MRI results showed no metastatic spine lesion. Chronic back pain and neck pain, images are consistent with chronic degenerative disease.  Patient continues to follow-up with [place] orthopedic surgeon.  #   INTERVAL HISTORY Luis Burke is a 65 y.o. male who has above history reviewed by me today for follow-up  05/15/2021 bone marrow biopsy showed slightly hypercellular bone marrow for age with slight plasmacytosis.  5% plasma cells with kappa light chain restriction.  Cytogenetics normal.  Myeloma FISH negative Patient has no complaints today.  He came back from a beach trip.    Review of Systems  Constitutional:  Negative for appetite change, chills, fatigue, fever and unexpected weight change.  HENT:   Negative for hearing loss and voice change.   Eyes:  Negative for eye problems and icterus.   Respiratory:  Negative for chest tightness, cough and shortness of breath.   Cardiovascular:  Negative for chest pain and leg swelling.  Gastrointestinal:  Negative for abdominal distention and abdominal pain.  Endocrine: Negative for hot flashes.  Genitourinary:  Negative for difficulty urinating, dysuria and frequency.   Musculoskeletal:  Positive for back pain. Negative for arthralgias and neck pain.  Skin:  Negative for itching and rash.  Neurological:  Negative for light-headedness and numbness.  Hematological:  Negative for adenopathy. Does not bruise/bleed easily.  Psychiatric/Behavioral:  Negative for confusion.    MEDICAL HISTORY:  Past Medical History:  Diagnosis Date   AKI (acute kidney injury) ([place]) 01/06/2019   Anemia    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[place])    autologous stem cell bone marrow transplant on 04/23/2019.   Fatty liver    on u/s 07/2018   Hyperlipidemia 03/2004   Hypertension 1994   Multiple myeloma ([place])    Snores     SURGICAL HISTORY: Past Surgical History:  Procedure Laterality Date   CATARACT EXTRACTION W/PHACO Right 03/04/2018   Procedure: CATARACT EXTRACTION PHACO AND INTRAOCULAR LENS PLACEMENT ([place])  RIGHT TORIC;  Surgeon: Leandrew Koyanagi, MD;  Location: [facility];  Service: Ophthalmology;  Laterality: Right;  [place] no Toric Lens 1:45 5.3   COLONOSCOPY WITH PROPOFOL N/A 07/20/2018   Procedure: COLONOSCOPY WITH PROPOFOL;  Surgeon: Jonathon Bellows, MD;  Location: [facility] ENDOSCOPY;  Service: Gastroenterology;  Laterality: N/A;   ESOPHAGOGASTRODUODENOSCOPY (EGD) WITH PROPOFOL N/A 07/20/2018  Procedure: ESOPHAGOGASTRODUODENOSCOPY (EGD) WITH PROPOFOL;  Surgeon: Jonathon Bellows, MD;  Location: [facility] ENDOSCOPY;  Service: Gastroenterology;  Laterality: N/A;   EYE SURGERY Right    HERNIA REPAIR     L Lap herniorraphy  04/2000   Left wrist ganglionectomy      SOCIAL HISTORY: Social History   Socioeconomic History   Marital status: Single     Spouse name: Not on file   Number of children: 1   Years of education: Not on file   Highest education level: Not on file  Occupational History   Occupation: [employer]  Tobacco Use   Smoking status: Never   Smokeless tobacco: Never   Tobacco comments:    Occassionally  Vaping Use   Vaping Use: Never used  Substance and Sexual Activity   Alcohol use: Not Currently    Alcohol/week: 3.0 standard drinks    Types: 3 Cans of beer per week   Drug use: No   Sexual activity: Not on file  Other Topics Concern   Not on file  Social History Narrative   Divorced Dec 13, 2007, dating as of 12/12/17   His daughter died 4 days after giving birth to the patient's granddaughter   Has joint custody of his dead daughter's child   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 Strain: Not on [employer] Insecurity: Not on file  Transportation Needs: Not on file  Physical Activity: Not on file  Stress: Not on file  Social Connections: Not on file  Intimate Partner Violence: Not on file    FAMILY HISTORY: Family History  Problem Relation Age of Onset   Hypertension Mother    Diabetes Mother    Heart disease Mother        CAD   Dementia Mother    Hypertension Father    Heart disease Father        MI 02/03   Colon cancer Father    Hypertension Sister    Hypertension Sister    Hypertension Sister    Prostate cancer Neg Hx     ALLERGIES:  is allergic to bactrim [sulfamethoxazole-trimethoprim], clonidine derivatives, and tadalafil.  MEDICATIONS:  Current Outpatient Medications  Medication Sig Dispense Refill   acetaminophen (TYLENOL) 325 MG tablet Take 650 mg by mouth every 6 (six) hours as needed.     amLODipine (NORVASC) 5 MG tablet TAKE 2 TABLETS BY MOUTH EVERY DAY 180 tablet 1   B Complex Vitamins (VITAMIN B COMPLEX PO) SMARTSIG:By Mouth     clopidogrel (PLAVIX) 75 MG tablet Take by mouth.     co-enzyme Q-10 50 MG capsule Take 50 mg by mouth  daily.     [employer] VITAMIN B12 1000 MCG tablet TAKE 1 TABLET BY MOUTH EVERY DAY 90 tablet 3   hydrALAZINE (APRESOLINE) 10 MG tablet TAKE 1 TABLET (10 MG TOTAL) BY MOUTH 3 (THREE) TIMES DAILY. 270 tablet 1   lisinopril (ZESTRIL) 20 MG tablet TAKE 1 TABLET BY MOUTH EVERY DAY. PLEASE MAKE APPOINTMENT FOR FURTHER REFILLS. 90 tablet 0   LORazepam (ATIVAN) 1 MG tablet TAKE 1 TABLET (1 MG TOTAL) BY MOUTH EVERY 6 (SIX) HOURS AS NEEDED FOR ANXIETY (FOR NAUSEA)     magnesium chloride (SLOW-MAG) 64 MG TBEC SR tablet Take 1 tablet (64 mg total) by mouth daily. 60 tablet 0   metoprolol succinate (TOPROL-XL) 50 MG 24 hr tablet TAKE 4 TABLETS (200 MG TOTAL) BY MOUTH DAILY. TAKE WITH OR IMMEDIATELY  FOLLOWING A MEAL. 360 tablet 2   ondansetron (ZOFRAN) 8 MG tablet Take 1 tablet (8 mg total) by mouth every 8 (eight) hours as needed. for nausea 30 tablet 1   pravastatin (PRAVACHOL) 10 MG tablet TAKE 1 TABLET BY MOUTH EVERY DAY 90 tablet 3   zinc gluconate 50 MG tablet Take 50 mg by mouth daily.     acyclovir (ZOVIRAX) 400 MG tablet Take 400 mg by mouth 2 (two) times daily. (Patient not taking: No sig reported)     cyclobenzaprine (FLEXERIL) 5 MG tablet Take 1 tablet (5 mg total) by mouth 3 (three) times daily as needed for muscle spasms. (Patient not taking: Reported on 05/29/2021) 30 tablet 0   pomalidomide (POMALYST) 2 MG capsule Take 1 capsule (2 mg total) by mouth daily. Take for 21 days on, then hold for 7 days. Repeat every 28 days. (Patient not taking: Reported on 05/29/2021) 21 capsule 0   prochlorperazine (COMPAZINE) 10 MG tablet TAKE 1 TABLET (10 MG TOTAL) BY MOUTH EVERY 6 (SIX) HOURS AS NEEDED FOR NAUSEA (Patient not taking: No sig reported)     scopolamine (TRANSDERM-SCOP, 1.5 MG,) 1 MG/3DAYS Place 1 patch (1.5 mg total) onto the skin every 3 (three) days. (Patient not taking: Reported on 05/29/2021) 4 patch 1   No current facility-administered medications for this visit.     PHYSICAL EXAMINATION: ECOG  PERFORMANCE STATUS: 1 - Symptomatic but completely ambulatory Vitals:   05/29/21 0924  BP: 140/88  Pulse: (!) 57  Resp: 18  Temp: 97.7 F (36.5 C)   Filed Weights   05/29/21 0924  Weight: 204 lb 14.4 oz (92.9 kg)    Physical Exam Constitutional:      General: He is not in acute distress. HENT:     Head: Normocephalic and atraumatic.  Eyes:     General: No scleral icterus. Cardiovascular:     Rate and Rhythm: Normal rate and regular rhythm.     Heart sounds: Normal heart sounds.  Pulmonary:     Effort: Pulmonary effort is normal. No respiratory distress.     Breath sounds: No wheezing.  Abdominal:     General: Bowel sounds are normal. There is no distension.     Palpations: Abdomen is soft.  Musculoskeletal:        General: No deformity. Normal range of motion.     Cervical back: Normal range of motion and neck supple.  Skin:    General: Skin is warm and dry.     Findings: No erythema or rash.  Neurological:     Mental Status: He is alert and oriented to person, place, and time. Mental status is at baseline.     Cranial Nerves: No cranial nerve deficit.     Coordination: Coordination normal.  Psychiatric:        Mood and Affect: Mood normal.     LABORATORY DATA:  I have reviewed the data as listed Lab Results  Component Value Date   WBC 4.7 05/29/2021   HGB 12.4 (L) 05/29/2021   HCT 37.5 (L) 05/29/2021   MCV 103.6 (H) 05/29/2021   PLT 84 (L) 05/29/2021   Recent Labs    05/30/20 0944 06/22/20 0847 07/20/20 0844 04/03/21 1055 05/01/21 0952 05/29/21 0901  NA 138 139   < > 137 137 139  K 4.4 4.5   < > 4.3 4.2 4.7  CL 104 103   < > 103 103 103  CO2 26 26   < > 26  25 29  GLUCOSE 102* 104*   < > 109* 105* 107*  BUN 20 24*   < > 20 19 26*  CREATININE 1.29* 1.55*   < > 1.26* 1.39* 1.49*  CALCIUM 9.2 9.8   < > 9.2 9.6 9.7  GFRNONAA 58* 47*   < > >60 56* 52*  GFRAA >60 54*  --   --   --   --   PROT 7.4 7.6   < > 7.7 7.7 7.4  ALBUMIN 4.4 4.3   < > 4.5 4.6  4.3  AST 23 24   < > _0 ALT 24 24   < > _1 ALKPHOS 66 59   < > 55 54 51  BILITOT 0.8 0.8   < > 0.9 0.8 1.0   < > = values in this interval not displayed.    Iron/TIBC/Ferritin/ %Sat    Component Value Date/Time   IRON 74 07/01/2018 0944   TIBC 250 07/01/2018 0944   FERRITIN 357 07/01/2018 0944   IRONPCTSAT 30 07/01/2018 0944    Lab Results  Component Value Date   TOTALPROTELP 7.0 05/01/2021   ALBUMINELP 4.0 03/08/2021   A1GS 0.2 03/08/2021   A2GS 0.7 03/08/2021   BETS 1.1 03/08/2021   GAMS 1.1 03/08/2021   MSPIKE Not Observed 03/08/2021   SPEI Comment 03/08/2021   Lab Results  Component Value Date   KPAFRELGTCHN 60.4 (H) 05/01/2021   LAMBDASER 15.2 05/01/2021   KAPLAMBRATIO 3.97 (H) 05/01/2021    RADIOGRAPHIC STUDIES: I have personally reviewed the radiological images as listed and agreed with the findings in the report. CT BONE MARROW BIOPSY & ASPIRATION  Result Date: 05/15/2021 CLINICAL DATA:  History of multiple myeloma with possible relapse due to increased light chains. Need for bone marrow biopsy for further evaluation. EXAM: CT GUIDED BONE MARROW ASPIRATION AND BIOPSY ANESTHESIA/SEDATION: Versed 2.0 mg IV, Fentanyl 100 mcg IV Total Moderate Sedation Time:   13 minutes. The patient's level of consciousness and physiologic status were continuously monitored during the procedure by Radiology nursing. PROCEDURE: The procedure risks, benefits, and alternatives were explained to the patient. Questions regarding the procedure were encouraged and answered. The patient understands and consents to the procedure. A time out was performed prior to initiating the procedure. The right gluteal region was prepped with chlorhexidine. Sterile gown and sterile gloves were used for the procedure. Local anesthesia was provided with 1% Lidocaine. Under CT guidance, an 11 gauge On Control bone cutting needle was advanced from a posterior approach into the right iliac bone. Needle  positioning was confirmed with CT. Initial non heparinized and heparinized aspirate samples were obtained of bone marrow. Core biopsy was performed via the On Control drill needle. Two separate core biopsy samples were obtained. COMPLICATIONS: None FINDINGS: Inspection of initial aspirate did reveal visible particles. Intact core biopsy samples were obtained. IMPRESSION: CT guided bone marrow biopsy of right posterior iliac bone with both aspirate and core samples obtained. Electronically Signed   By: Aletta Edouard M.D.   On: 05/15/2021 10:51      ASSESSMENT & PLAN:  1. Multiple myeloma in relapse ([place])   2. Encounter for antineoplastic chemotherapy   3. Thrombocytopenia ([facility])   4. Stage 3a chronic kidney disease ([place])   5. Macrocytic anemia    #Light chain multiple myeloma beta 2 microglobulin 5 and normal albumin.  Stage II.cytogenetics showed normal male chromosome, MDS FISH panel showed trisomy 21- Standard Risk.S/p RVD x  9 and  Autologous bone marrow stem cell transplant at [facility] on 04/23/2019. 04/03/2021, light chain ratio increased to 3.76.   05/15/2021 bone marrow biopsy showed slightly hypercellular bone marrow for age with slight plasmacytosis.  5% plasma cells with kappa light chain restriction.  Cytogenetics normal.  Myeloma FISH negative I discussed with [facility]. we both feel that his bone marrow biopsy result reflects early relapse. Recommend stop Ixazomib.  Switch to second line treatment with Daratumumab Pomlyst Dexamethasone.  Will start him on Pomlyst 60m for first cycle.  Chemotherapy education was provided.  We had discussed the composition of chemotherapy regimen, length of chemo cycle, duration of treatment. Rationale and potential side effects were discussed with patient.  Labs are reviewed and discussed with patient. Proceed with cycle 1 treatment on 05/31/2021.  # continue Xgeva monthly.  He is on Plavix.   # Thrombocytopenia, monitor closely.  # CKD,  creatinine is stable.  # Mild anemia, stable.  # Macrocytic anemia, check B12 and folate.   Post bone marrow transplant care He has completed 1 year of acyclovir.  Off acyclovir now. [facility]    Supportive care measures are necessary for patient well-being and will be provided as necessary. We spent sufficient time to discuss many aspect of care, questions were answered to patient's satisfaction.   chronic kidney disease, creatinine 1. 39.    Encourage oral hydration avoid nephrotoxins.  Post bone marrow transplant care, He has completed 1 year of acyclovir.  [facility]ovir now. [facility]   #Bone health/osteopenia/multiple myeloma-  Continue Xgeva monthly for up to 2 years, proceed with Xgeva on 05/03/2021 Continue calcium and vitamin D supplementation. Follow up 4 weeks for evaluation prior to starting next cycle of chemotherapy for maintenance.  ZEarlie Server MD, PhD Hematology Oncology [facility]8/16/2022

## 2021-05-30 ENCOUNTER — Encounter: Payer: Self-pay | Admitting: Pharmacist

## 2021-05-30 MED ORDER — DEXAMETHASONE 4 MG PO TABS: 20.0000 mg | ORAL_TABLET | ORAL | 1 refills | Status: DC

## 2021-05-31 ENCOUNTER — Inpatient Hospital Stay: Payer: Medicare Other

## 2021-05-31 VITALS — BP 152/88 | HR 86 | Temp 98.0°F | Resp 18

## 2021-05-31 DIAGNOSIS — N179 Acute kidney failure, unspecified: Secondary | ICD-10-CM

## 2021-05-31 DIAGNOSIS — C9000 Multiple myeloma not having achieved remission: Secondary | ICD-10-CM

## 2021-05-31 DIAGNOSIS — Z5112 Encounter for antineoplastic immunotherapy: Secondary | ICD-10-CM | POA: Diagnosis not present

## 2021-05-31 MED ORDER — SODIUM CHLORIDE 0.9 % IV SOLN
Freq: Once | INTRAVENOUS | Status: DC | PRN
  Filled 2021-05-31: qty 250

## 2021-05-31 MED ORDER — SODIUM CHLORIDE 0.9 % IV SOLN
20.0000 mg | Freq: Once | INTRAVENOUS | Status: AC
  Administered 2021-05-31: 20 mg via INTRAVENOUS
  Filled 2021-05-31: qty 20

## 2021-05-31 MED ORDER — MONTELUKAST SODIUM 10 MG PO TABS
10.0000 mg | ORAL_TABLET | Freq: Once | ORAL | Status: AC
  Administered 2021-05-31: 10 mg via ORAL
  Filled 2021-05-31: qty 1

## 2021-05-31 MED ORDER — ONDANSETRON HCL 4 MG/2ML IJ SOLN
8.0000 mg | Freq: Once | INTRAMUSCULAR | Status: AC
  Administered 2021-05-31: 8 mg via INTRAVENOUS
  Filled 2021-05-31: qty 4

## 2021-05-31 MED ORDER — SODIUM CHLORIDE 0.9 % IV SOLN
Freq: Once | INTRAVENOUS | Status: AC
  Filled 2021-05-31: qty 250

## 2021-05-31 MED ORDER — SODIUM CHLORIDE 0.9 % IV SOLN
16.0000 mg/kg | Freq: Once | INTRAVENOUS | Status: AC
  Administered 2021-05-31: 1500 mg via INTRAVENOUS
  Filled 2021-05-31: qty 60

## 2021-05-31 MED ORDER — DENOSUMAB 120 MG/1.7ML ~~LOC~~ SOLN
120.0000 mg | Freq: Once | SUBCUTANEOUS | Status: AC
  Administered 2021-05-31: 120 mg via SUBCUTANEOUS
  Filled 2021-05-31: qty 1.7

## 2021-05-31 MED ORDER — ACETAMINOPHEN 325 MG PO TABS
650.0000 mg | ORAL_TABLET | Freq: Once | ORAL | Status: AC
  Administered 2021-05-31: 650 mg via ORAL
  Filled 2021-05-31: qty 2

## 2021-05-31 MED ORDER — DIPHENHYDRAMINE HCL 50 MG/ML IJ SOLN
50.0000 mg | Freq: Once | INTRAMUSCULAR | Status: AC | PRN
  Administered 2021-05-31: 25 mg via INTRAVENOUS

## 2021-05-31 MED ORDER — FAMOTIDINE 20 MG IN NS 100 ML IVPB
20.0000 mg | Freq: Once | INTRAVENOUS | Status: AC | PRN
  Administered 2021-05-31: 20 mg via INTRAVENOUS
  Filled 2021-05-31: qty 100

## 2021-05-31 MED ORDER — ONDANSETRON HCL 40 MG/20ML IJ SOLN: 8.0000 mg | Freq: Once | INTRAMUSCULAR | Status: DC

## 2021-05-31 MED ORDER — METHYLPREDNISOLONE SODIUM SUCC 125 MG IJ SOLR
125.0000 mg | Freq: Once | INTRAMUSCULAR | Status: AC | PRN
  Administered 2021-05-31: 125 mg via INTRAVENOUS

## 2021-05-31 MED ORDER — DIPHENHYDRAMINE HCL 25 MG PO CAPS
50.0000 mg | ORAL_CAPSULE | Freq: Once | ORAL | Status: AC
  Administered 2021-05-31: 50 mg via ORAL
  Filled 2021-05-31: qty 2

## 2021-05-31 NOTE — Progress Notes (Signed)
Darzalex started at 1000. All pre medications given per protocol and pt waited 1 hour after premedications were given per protocol prior to starting Darzalex. At 1040 pt expressed to RN that his chest felt tight/heavy, it was difficult to breath, pt had a flushed appearance. Darzalex stopped immediately, 1 L of NS started, vitals were taken, and Dr Tasia Catchings and Sonia Baller NP notified. * See flowsheets for vitals signs* Solumedrol 125 mg IV and benadryl 25 mg IV were given at 1046. At 1047 Pepcid 20 20 mg started. At [place], NP at chairside to assess pt. At 1055 pt expressed to RN that the chest tightness was starting to feel better. No new orders given at this time. At 1111 pt expressed that "he felt really nauseous and that his stomach felt tight" Sonia Baller NP notified, per NP to give zofran 8 mg IV at this time. At 1120, pt expressed to RN that he felt very cold and appeared to have mild rigors. Sonia Baller NP notified, no new orders given at this time.   1 L of NS bolus completed at 1200. MD and NP notified. At this time, pt expressed that he was "feeling better and back to his baseline." Vitals stable. MD and treatment team updated. Per Dr Tasia Catchings to proceed with Darzalex starting at 25 mL/hr and then titrate per policy. Per pt all symptoms have gone away and states that he "feels back to his baseline." Darzalex restarted at 1240 per order. Pt and treatment team updated. Vitals stable.    Patient tolerated remainder of treatment well. Patient and VSS. Patient encouraged to contact clinic should he have any questions or concerns. Patient verbalizes understanding and denies any further questions or concerns.

## 2021-05-31 NOTE — Patient Instructions (Signed)
[facility]  Discharge Instructions: Thank you for choosing [facility] to provide your oncology and hematology care.  If you have a lab appointment with [facility], please go directly to the [facility] and check in at the registration area.  Wear comfortable clothing and clothing appropriate for easy access to any Portacath or PICC line.   We strive to give you quality time with your provider. You may need to reschedule your appointment if you arrive late (15 or more minutes).  Arriving late affects you and other patients whose appointments are after yours.  Also, if you miss three or more appointments without notifying the office, you may be dismissed from the clinic at the provider's discretion.      For prescription refill requests, have your pharmacy contact our office and allow 72 hours for refills to be completed.    Today you received the following chemotherapy and/or immunotherapy agents daratumumab   Daratumumab injection What is this medication? DARATUMUMAB (dar a toom ue mab) is a monoclonal antibody. It is used to treatmultiple myeloma. This medicine may be used for other purposes; ask your health care provider or pharmacist if you have questions. COMMON BRAND NAME(S): DARZALEX What should I tell my care team before I take this medication? They need to know if you have any of these conditions: hereditary fructose intolerance infection (especially a virus infection such as chickenpox, herpes, or hepatitis B virus) lung or breathing disease (asthma, COPD) an unusual or allergic reaction to daratumumab, sorbitol, other medicines, foods, dyes, or preservatives pregnant or trying to get pregnant breast-feeding How should I use this medication? This medicine is for infusion into a vein. It is given by a health careprofessional in a hospital or clinic setting. Talk to your pediatrician regarding the use of this medicine in  children.Special care may be needed. Overdosage: If you think you have taken too much of this medicine contact apoison control center or emergency room at once. NOTE: This medicine is only for you. Do not share this medicine with others. What if I miss a dose? Keep appointments for follow-up doses as directed. It is important not to miss your dose. Call your doctor or health care professional if you are unable tokeep an appointment. What may interact with this medication? Interactions have not been studied. This list may not describe all possible interactions. Give your health care provider a list of all the medicines, herbs, non-prescription drugs, or dietary supplements you use. Also tell them if you smoke, drink alcohol, or use illegaldrugs. Some items may interact with your medicine. What should I watch for while using this medication? Your condition will be monitored carefully while you are receiving thismedicine. This medicine can cause serious allergic reactions. To reduce your risk, your health care provider may give you other medicine to take before receiving thisone. Be sure to follow the directions from your health care provider. This medicine can affect the results of blood tests to match your blood type. These changes can last for up to 6 months after the final dose. Your healthcare provider will do blood tests to match your blood type before you start treatment. Tell all of your healthcare providers that you are being treatedwith this medicine before receiving a blood transfusion. This medicine can affect the results of some tests used to determine treatmentresponse; extra tests may be needed to evaluate response. Do not become pregnant while taking this medicine or for 3 months after  stopping it. Women should inform their health care provider if they wish to become pregnant or think they might be pregnant. There is a potential for serious side effects to an unborn child. Talk to your  health care provider formore information. Do not breast-feed an infant while taking this medicine. What side effects may I notice from receiving this medication? Side effects that you should report to your doctor or health care professionalas soon as possible: allergic reactions (skin rash, itching, hives, swelling of the face, lips, or tongue) blurred vision infection (fever, chills, cough, sore throat, pain or difficulty passing urine) infusion reaction (dizziness, fast heartbeat, feeling faint or lightheaded, falls, headache, increase in blood pressure, nausea, vomiting, or wheezing or trouble breathing with loud or whistling sounds) unusual bleeding or bruising Side effects that usually do not require medical attention (report to yourdoctor or health care professional if they continue or are bothersome): constipation diarrhea pain, tingling, numbness in the hands or feet swelling of the ankles, feet, hands tiredness This list may not describe all possible side effects. Call your doctor for medical advice about side effects. You may report side effects to FDA at1-800-FDA-1088. Where should I keep my medication? This drug is given in a hospital or clinic and will not be stored at home. NOTE: This sheet is a summary. It may not cover all possible information. If you have questions about this medicine, talk to your doctor, pharmacist, orhealth care provider.  2022 Elsevier/Gold Standard (2020-11-09 12:50:38)    To help prevent nausea and vomiting after your treatment, we encourage you to take your nausea medication as directed.  BELOW ARE SYMPTOMS THAT SHOULD BE REPORTED IMMEDIATELY: *FEVER GREATER THAN 100.4 F (38 C) OR HIGHER *CHILLS OR SWEATING *NAUSEA AND VOMITING THAT IS NOT CONTROLLED WITH YOUR NAUSEA MEDICATION *UNUSUAL SHORTNESS OF BREATH *UNUSUAL BRUISING OR BLEEDING *URINARY PROBLEMS (pain or burning when urinating, or frequent urination) *BOWEL PROBLEMS (unusual diarrhea,  constipation, pain near the anus) TENDERNESS IN MOUTH AND THROAT WITH OR WITHOUT PRESENCE OF ULCERS (sore throat, sores in mouth, or a toothache) UNUSUAL RASH, SWELLING OR PAIN  UNUSUAL VAGINAL DISCHARGE OR ITCHING   Items with * indicate a potential emergency and should be followed up as soon as possible or go to the Emergency Department if any problems should occur.  Please show the CHEMOTHERAPY ALERT CARD or IMMUNOTHERAPY ALERT CARD at check-in to the Emergency Department and triage nurse.  Should you have questions after your visit or need to cancel or reschedule your appointment, please contact [facility]  [phone] and follow the prompts.  Office hours are 8:00 a.m. to 4:30 p.m. Monday - Friday. Please note that voicemails left after 4:00 p.m. may not be returned until the following business day.  We are closed weekends and major holidays. You have access to a nurse at all times for urgent questions. Please call the main number to the clinic [phone] and follow the prompts.  For any non-urgent questions, you may also contact your provider using MyChart. We now offer e-Visits for anyone 77 and older to request care online for non-urgent symptoms. For details visit [email or web address].   Also download the MyChart app! Go to the app store, search "MyChart", open the app, select [facility], and log in with your MyChart username and password.  Due to Covid, a mask is required upon entering the hospital/clinic. If you do not have a mask, one will be given to you upon arrival. For doctor visits,  patients may have 1 support person aged 52 or older with them. For treatment visits, patients cannot have anyone with them due to current Covid guidelines and our immunocompromised population.

## 2021-06-01 ENCOUNTER — Other Ambulatory Visit: Payer: Self-pay | Admitting: Oncology

## 2021-06-01 NOTE — Telephone Encounter (Signed)
Called Luis Burke today to review the plan for his Monday- Wednesday premedications. He plans on reviewing the message and picking up the loratadine and famotidine. He has the montelukast and acetaminophen already in hand.   Darl Pikes, PharmD, BCPS, BCOP, CPP Hematology/Oncology Clinical Pharmacist Practitioner [facility]/HP/AP [facility] [phone]  06/01/2021 1:46 PM

## 2021-06-05 ENCOUNTER — Encounter: Payer: Self-pay | Admitting: Oncology

## 2021-06-05 ENCOUNTER — Inpatient Hospital Stay (HOSPITAL_BASED_OUTPATIENT_CLINIC_OR_DEPARTMENT_OTHER): Payer: Medicare Other | Admitting: Oncology

## 2021-06-05 ENCOUNTER — Inpatient Hospital Stay: Payer: Medicare Other

## 2021-06-05 VITALS — BP 161/95 | HR 75 | Temp 98.6°F | Resp 18 | Wt 206.0 lb

## 2021-06-05 DIAGNOSIS — G8929 Other chronic pain: Secondary | ICD-10-CM

## 2021-06-05 DIAGNOSIS — M899 Disorder of bone, unspecified: Secondary | ICD-10-CM | POA: Insufficient documentation

## 2021-06-05 DIAGNOSIS — M545 Low back pain, unspecified: Secondary | ICD-10-CM

## 2021-06-05 DIAGNOSIS — N1831 Chronic kidney disease, stage 3a: Secondary | ICD-10-CM

## 2021-06-05 DIAGNOSIS — C9000 Multiple myeloma not having achieved remission: Secondary | ICD-10-CM

## 2021-06-05 DIAGNOSIS — D539 Nutritional anemia, unspecified: Secondary | ICD-10-CM

## 2021-06-05 DIAGNOSIS — Z5111 Encounter for antineoplastic chemotherapy: Secondary | ICD-10-CM

## 2021-06-05 DIAGNOSIS — C9002 Multiple myeloma in relapse: Secondary | ICD-10-CM

## 2021-06-05 DIAGNOSIS — D696 Thrombocytopenia, unspecified: Secondary | ICD-10-CM | POA: Diagnosis not present

## 2021-06-05 DIAGNOSIS — Z5112 Encounter for antineoplastic immunotherapy: Secondary | ICD-10-CM | POA: Diagnosis not present

## 2021-06-05 LAB — COMPREHENSIVE METABOLIC PANEL
ALT: 49 U/L — ABNORMAL HIGH (ref 0–44)
AST: 22 U/L (ref 15–41)
Albumin: 4.4 g/dL (ref 3.5–5.0)
Alkaline Phosphatase: 77 U/L (ref 38–126)
Anion gap: 9 (ref 5–15)
BUN: 23 mg/dL (ref 8–23)
CO2: 27 mmol/L (ref 22–32)
Calcium: 9.9 mg/dL (ref 8.9–10.3)
Chloride: 100 mmol/L (ref 98–111)
Creatinine, Ser: 1.36 mg/dL — ABNORMAL HIGH (ref 0.61–1.24)
GFR, Estimated: 58 mL/min — ABNORMAL LOW (ref >60)
Glucose, Bld: 130 mg/dL — ABNORMAL HIGH (ref 70–99)
Potassium: 3.7 mmol/L (ref 3.5–5.1)
Sodium: 136 mmol/L (ref 135–145)
Total Bilirubin: 0.6 mg/dL (ref 0.3–1.2)
Total Protein: 7.4 g/dL (ref 6.5–8.1)

## 2021-06-05 LAB — CBC WITH DIFFERENTIAL/PLATELET
Abs Immature Granulocytes: 0.02 10*3/uL (ref 0.00–0.07)
Basophils Absolute: 0 10*3/uL (ref 0.0–0.1)
Basophils Relative: 0 %
Eosinophils Absolute: 0.1 10*3/uL (ref 0.0–0.5)
Eosinophils Relative: 2 %
HCT: 38.8 % — ABNORMAL LOW (ref 39.0–52.0)
Hemoglobin: 13.2 g/dL (ref 13.0–17.0)
Immature Granulocytes: 1 %
Lymphocytes Relative: 27 %
Lymphs Abs: 1 10*3/uL (ref 0.7–4.0)
MCH: 34.2 pg — ABNORMAL HIGH (ref 26.0–34.0)
MCHC: 34 g/dL (ref 30.0–36.0)
MCV: 100.5 fL — ABNORMAL HIGH (ref 80.0–100.0)
Monocytes Absolute: 0.4 10*3/uL (ref 0.1–1.0)
Monocytes Relative: 9 %
Neutro Abs: 2.4 10*3/uL (ref 1.7–7.7)
Neutrophils Relative %: 61 %
Platelets: 76 10*3/uL — ABNORMAL LOW (ref 150–400)
RBC: 3.86 MIL/uL — ABNORMAL LOW (ref 4.22–5.81)
RDW: 13.4 % (ref 11.5–15.5)
WBC: 3.9 10*3/uL — ABNORMAL LOW (ref 4.0–10.5)
nRBC: 0 % (ref 0.0–0.2)

## 2021-06-05 LAB — VITAMIN B12: Vitamin B-12: 833 pg/mL (ref 180–914)

## 2021-06-05 LAB — FOLATE: Folate: 15.5 ng/mL (ref >5.9)

## 2021-06-05 NOTE — Progress Notes (Signed)
Hematology/Oncology  Follow up note [facility] Telephone:([phone]) [phone] Fax:([phone]) [phone]   Patient Care Team: Tonia Ghent, MD as PCP - General (Family Medicine) Earlie Server, MD as Consulting Physician (Oncology) Beather Arbour Lilyan Punt, MD as Referring Physician (Hematology and Oncology) Diannia Ruder, MD as Referring Physician (Hematology and Oncology)  REASON FOR VISIT Follow up for multiple myeloma.   HISTORY OF PRESENTING ILLNESS:  Luis Burke is a  65 y.o.  male with PMH listed below presents for follow up of multiple myeloma.  # 07/27/2018 multiple myeloma panel showed M protein of 0.1, IgG 662, IgA 49, IgM 7. Patient was called back to further labs done. 08/10/2018, free light chain ratio showed extremely high level of kappa free light chain 10,183, with a kappa lambda light chain ratio of 1414.31 LDH 164 Beta-2 microglobulin 5 Patient was called and discuss about results.  He was recommended to undergo bone marrow biopsy and PET scan. 08/19/2018 bone marrow biopsy showed hypercellular marrow 80%, involved by plasma cell neoplasm up to 95%.  Consistent with plasma cell myeloma. Myeloma FISH  gain of gain of CEP12  09/10/2018 skeletal survey showed questionable small lucent lesions within the midshaft of the humerus bilaterally.  Otherwise no suspicious focal lytic lesion or acute bone abnormality. 08/25/2018 PET scan showed no definite hypermetabolic bone disease but CT findings are highly suspicious for numerous small myelomatous lesions involving the spine, sternum and scattered ribs.  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 He received preparative regimen with melphalan 200 mg/m on 04/22/2019 followed by autologous stem cell infusion on 04/23/2019. Currently he is transfusion independent.  Transfusion criteria with as needed hemoglobin less than 7.5 for platelet less than 10,000.  Transplant course was complicated with  febrile neutropenia grade 3, treated with vancomycin and cephapirin until engraftment on 05/06/2019. Patient also had engraftment syndrome and had to be started on Solu-Medrol 25 mg twice daily on 05/05/2019.  Transition to Medrol Dosepak on 05/07/2019 to complete steroid taper as outpatient.  Patient is currently on acyclovir for 1 year after transplant, dose was reduced on 722 11/20/2018 due to renal function. Patient had a baseline CKD with creatinine running between 1.4-1.6.  He had acute on chronic kidney injury and creatinine went up to 2.2 on 722.  Fluid hydration was given and creatinine decreased to baseline 1.5-1.7. His Hickman catheter was removed.  Candida Cruris treated with topical clotrimazole 1% 3 times daily and to resolution..  #  seen by Dr. Tonia Brooms BMT team on 06/30/2019.revaccination at [facility] begins at 12 months post transplant I discussed with [facility] hematology Dr.Choi and he recommends plans as listed below.  -patient to be started on Ixazomib maintenance: -Ixazomib 77m on D1/D8/D15 out of 28 day cycle.  -patient will start re-vaccination 12 months post transplant.  -Post transplant bone marrow biopsy if clinically indicated.  # seen by [facility] bone marrow transplant team in June 2021.  Patient has been started on immunization protocol.  He reports no new complaints.  #[facility]  Lucent lesion in the L4 spinous process measuring 4 mm which is  nonspecific. Consider confirmation of marrow replacing process with MRI.  2.  Incompletely characterized renal cysts   # He continues to have chronic back pain and neck pain.  [facility], MRI lumbar spine with and without contrast showed no evidence of spinal metastasis.  Lumbar spondylosis is most pronounced  at L5-S1 where there is severe right and moderate left neural foraminal stenosis. 08/11/2020, x-ray cervical spine complete with flexion and extension showed The cervical spine  is visualized from C1 to the top of T1 on the lateral  view.  Reversal of the normal cervical lordosis with a focal kyphosis centered at the C5-C6 intervertebral level. Trace anterolisthesis of C4 onto C5. Trace retrolisthesis of C6 on C7. No evidence of dynamic instability is noted on  the flexion or extension views.   No prevertebral soft tissue swelling. Cervical vertebral body heights are maintained. Multilevel degenerative changes of the cervical spine including discogenic disease, most pronounced at C5-C6 and C6-C7, and facet arthropathy most pronounced at C4-C5. Multilevel mild to moderate left neural foraminal osseous narrowing of the mid to lower cervical vertebrae, possibly centrally/artifactual by patient positioning.  #07/28/2020 bone marrow normocellular marrow with polytypic plasmacytosis, 3 to 8%   Patient is in remission.  Cytogenetics negative for trisomy 12.  Negative myeloma FISH panel.   # Lumbar lesion,  11/13/20 [facility]  MRI cervical sine w/wo contrast - no evidence of metastatic disease C3-4 severe spinal stenosis, left neural foraminal narrowing. C4-5 spinal canal stenosis. [facility] MRI results showed no metastatic spine lesion. Chronic back pain and neck pain, images are consistent with chronic degenerative disease.  Patient continues to follow-up with [place] orthopedic surgeon.  # 05/15/2021 bone marrow biopsy showed slightly hypercellular bone marrow for age with slight plasmacytosis.  5% plasma cells with kappa light chain restriction.  Cytogenetics normal.  Myeloma FISH negative  # 05/31/2021 Daratumumab -Pomalyst-dexamethasone  INTERVAL HISTORY Luis Burke is a 65 y.o. male who has above history reviewed by me today for follow-up  Started on Daratumumab Pomalyst dexamethasone last week,  Patient had grade 2 infusion reaction and was treated with Benadryl, steroids, Pepcid.  Symptoms improved and he was able to finish Daratumumab treatments.  Today presented for follow-up.   He continues to have lower back pain.  Heart pounding sensation.  Review of Systems  Constitutional:  Negative for appetite change, chills, fatigue, fever and unexpected weight change.  HENT:   Negative for hearing loss and voice change.   Eyes:  Negative for eye problems and icterus.  Respiratory:  Negative for chest tightness, cough and shortness of breath.   Cardiovascular:  Negative for chest pain and leg swelling.  Gastrointestinal:  Negative for abdominal distention and abdominal pain.  Endocrine: Negative for hot flashes.  Genitourinary:  Negative for difficulty urinating, dysuria and frequency.   Musculoskeletal:  Positive for back pain. Negative for arthralgias and neck pain.  Skin:  Negative for itching and rash.  Neurological:  Negative for light-headedness and numbness.  Hematological:  Negative for adenopathy. Does not bruise/bleed easily.  Psychiatric/Behavioral:  Negative for confusion.    MEDICAL HISTORY:  Past Medical History:  Diagnosis Date   AKI (acute kidney injury) ([place]) 01/06/2019   Anemia    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[place])    autologous stem cell bone marrow transplant on 04/23/2019.   Fatty liver    on u/s 07/2018   Hyperlipidemia 03/2004   Hypertension 1994   Multiple myeloma ([place])    Snores     SURGICAL HISTORY: Past Surgical History:  Procedure Laterality Date   CATARACT EXTRACTION W/PHACO Right 03/04/2018   Procedure: CATARACT EXTRACTION PHACO AND INTRAOCULAR LENS PLACEMENT ([place])  RIGHT TORIC;  Surgeon: Leandrew Koyanagi, MD;  Location: [place];  Service: Ophthalmology;  Laterality: Right;  [place] no Toric Lens  1:45 5.3   COLONOSCOPY WITH PROPOFOL N/A 07/20/2018   Procedure: COLONOSCOPY WITH PROPOFOL;  Surgeon: Jonathon Bellows, MD;  Location: [facility] ENDOSCOPY;  Service: Gastroenterology;  Laterality: N/A;   ESOPHAGOGASTRODUODENOSCOPY (EGD) WITH PROPOFOL N/A 07/20/2018   Procedure: ESOPHAGOGASTRODUODENOSCOPY (EGD) WITH PROPOFOL;  Surgeon:  Jonathon Bellows, MD;  Location: [facility] ENDOSCOPY;  Service: Gastroenterology;  Laterality: N/A;   EYE SURGERY Right    HERNIA REPAIR     L Lap herniorraphy  04/2000   Left wrist ganglionectomy      SOCIAL HISTORY: Social History   Socioeconomic History   Marital status: Single    Spouse name: Not on file   Number of children: 1   Years of education: Not on file   Highest education level: Not on file  Occupational History   Occupation: [employer]  Tobacco Use   Smoking status: Never   Smokeless tobacco: Never   Tobacco comments:    Occassionally  Vaping Use   Vaping Use: Never used  Substance and Sexual Activity   Alcohol use: Not Currently    Alcohol/week: 3.0 standard drinks    Types: 3 Cans of beer per week   Drug use: No   Sexual activity: Not on file  Other Topics Concern   Not on file  Social History Narrative   Divorced 06-Jan-2008, dating as of 05-Jan-2018   His daughter died 4 days after giving birth to the patient's granddaughter   Has joint custody of his dead daughter's child   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 Strain: Not on [employer] Insecurity: Not on file  Transportation Needs: Not on file  Physical Activity: Not on file  Stress: Not on file  Social Connections: Not on file  Intimate Partner Violence: Not on file    FAMILY HISTORY: Family History  Problem Relation Age of Onset   Hypertension Mother    Diabetes Mother    Heart disease Mother        CAD   Dementia Mother    Hypertension Father    Heart disease Father        MI 02/03   Colon cancer Father    Hypertension Sister    Hypertension Sister    Hypertension Sister    Prostate cancer Neg Hx     ALLERGIES:  is allergic to bactrim [sulfamethoxazole-trimethoprim], clonidine derivatives, and tadalafil.  MEDICATIONS:  Current Outpatient Medications  Medication Sig Dispense Refill   [START ON 06/07/2021] acetaminophen (TYLENOL) 325 MG tablet  Take 2 tablets (650 mg total) by mouth See admin instructions. 1 hour prior to chemotherapy treatments 30 tablet 0   amLODipine (NORVASC) 5 MG tablet TAKE 2 TABLETS BY MOUTH EVERY DAY 180 tablet 1   B Complex Vitamins (VITAMIN B COMPLEX PO) SMARTSIG:By Mouth     co-enzyme Q-10 50 MG capsule Take 50 mg by mouth daily.     [facility] VITAMIN B12 1000 MCG tablet TAKE 1 TABLET BY MOUTH EVERY DAY 90 tablet 3   dexamethasone (DECADRON) 4 MG tablet Take 5 tablets (20 mg total) by mouth once a week. Take at least 1 hour prior to infusion appt. Take with food. To start on 06/07/21. 20 tablet 1   [START ON 06/07/2021] diphenhydrAMINE (BENADRYL) 50 MG tablet Take 1 tablet (50 mg total) by mouth See admin instructions. Take 1 tablet 1 hour prior to chemotherapy treatments. 30 tablet 0   hydrALAZINE (APRESOLINE) 10 MG tablet TAKE 1  TABLET (10 MG TOTAL) BY MOUTH 3 (THREE) TIMES DAILY. 270 tablet 1   lisinopril (ZESTRIL) 20 MG tablet TAKE 1 TABLET BY MOUTH EVERY DAY. PLEASE MAKE APPOINTMENT FOR FURTHER REFILLS. 90 tablet 0   LORazepam (ATIVAN) 1 MG tablet TAKE 1 TABLET (1 MG TOTAL) BY MOUTH EVERY 6 (SIX) HOURS AS NEEDED FOR ANXIETY (FOR NAUSEA)     magnesium chloride (SLOW-MAG) 64 MG TBEC SR tablet Take 1 tablet (64 mg total) by mouth daily. 60 tablet 0   metoprolol succinate (TOPROL-XL) 50 MG 24 hr tablet TAKE 4 TABLETS (200 MG TOTAL) BY MOUTH DAILY. TAKE WITH OR IMMEDIATELY FOLLOWING A MEAL. 360 tablet 2   [START ON 06/07/2021] montelukast (SINGULAIR) 10 MG tablet Take 1 tablet (10 mg total) by mouth See admin instructions. Take 1 hour prior to chemotherapy 30 tablet 0   ondansetron (ZOFRAN) 8 MG tablet Take 1 tablet (8 mg total) by mouth every 8 (eight) hours as needed. for nausea 30 tablet 1   pomalidomide (POMALYST) 2 MG capsule Take 1 capsule (2 mg total) by mouth daily. Take for 21 days on, then hold for 7 days. Repeat every 28 days. 21 capsule 0   pravastatin (PRAVACHOL) 10 MG tablet TAKE 1 TABLET BY MOUTH EVERY DAY  90 tablet 3   zinc gluconate 50 MG tablet Take 50 mg by mouth daily.     acyclovir (ZOVIRAX) 400 MG tablet Take 400 mg by mouth 2 (two) times daily. (Patient not taking: No sig reported)     clopidogrel (PLAVIX) 75 MG tablet Take by mouth. (Patient not taking: Reported on 06/05/2021)     cyclobenzaprine (FLEXERIL) 5 MG tablet Take 1 tablet (5 mg total) by mouth 3 (three) times daily as needed for muscle spasms. (Patient not taking: No sig reported) 30 tablet 0   prochlorperazine (COMPAZINE) 10 MG tablet TAKE 1 TABLET (10 MG TOTAL) BY MOUTH EVERY 6 (SIX) HOURS AS NEEDED FOR NAUSEA (Patient not taking: No sig reported)     No current facility-administered medications for this visit.     PHYSICAL EXAMINATION: ECOG PERFORMANCE STATUS: 1 - Symptomatic but completely ambulatory Vitals:   06/05/21 1454  BP: (!) 161/95  Pulse: 75  Resp: 18  Temp: 98.6 F (37 C)   Filed Weights   06/05/21 1454  Weight: 206 lb (93.4 kg)    Physical Exam Constitutional:      General: He is not in acute distress. HENT:     Head: Normocephalic and atraumatic.  Eyes:     General: No scleral icterus. Cardiovascular:     Rate and Rhythm: Normal rate and regular rhythm.     Heart sounds: Normal heart sounds.  Pulmonary:     Effort: Pulmonary effort is normal. No respiratory distress.     Breath sounds: No wheezing.  Abdominal:     General: Bowel sounds are normal. There is no distension.     Palpations: Abdomen is soft.  Musculoskeletal:        General: No deformity. Normal range of motion.     Cervical back: Normal range of motion and neck supple.  Skin:    General: Skin is warm and dry.     Findings: No erythema or rash.  Neurological:     Mental Status: He is alert and oriented to person, place, and time. Mental status is at baseline.     Cranial Nerves: No cranial nerve deficit.     Coordination: Coordination normal.  Psychiatric:  Mood and Affect: Mood normal.     LABORATORY DATA:   I have reviewed the data as listed Lab Results  Component Value Date   WBC 3.9 (L) 06/05/2021   HGB 13.2 06/05/2021   HCT 38.8 (L) 06/05/2021   MCV 100.5 (H) 06/05/2021   PLT 76 (L) 06/05/2021   Recent Labs    06/22/20 0847 07/20/20 0844 05/01/21 0952 05/29/21 0901 06/05/21 1437  NA 139   < > 137 139 136  K 4.5   < > 4.2 4.7 3.7  CL 103   < > 103 103 100  CO2 26   < > $R'25 29 27  'na$ GLUCOSE 104*   < > 105* 107* 130*  BUN 24*   < > 19 26* 23  CREATININE 1.55*   < > 1.39* 1.49* 1.36*  CALCIUM 9.8   < > 9.6 9.7 9.9  GFRNONAA 47*   < > 56* 52* 58*  GFRAA 54*  --   --   --   --   PROT 7.6   < > 7.7 7.4 7.4  ALBUMIN 4.3   < > 4.6 4.3 4.4  AST 24   < > $R'23 22 22  'Ap$ ALT 24   < > 21 29 49*  ALKPHOS 59   < > 54 51 77  BILITOT 0.8   < > 0.8 1.0 0.6   < > = values in this interval not displayed.    Iron/TIBC/Ferritin/ %Sat    Component Value Date/Time   IRON 74 07/01/2018 0944   TIBC 250 07/01/2018 0944   FERRITIN 357 07/01/2018 0944   IRONPCTSAT 30 07/01/2018 0944    Lab Results  Component Value Date   TOTALPROTELP 7.0 05/01/2021   ALBUMINELP 4.0 03/08/2021   A1GS 0.2 03/08/2021   A2GS 0.7 03/08/2021   BETS 1.1 03/08/2021   GAMS 1.1 03/08/2021   MSPIKE Not Observed 03/08/2021   SPEI Comment 03/08/2021   Lab Results  Component Value Date   KPAFRELGTCHN 60.4 (H) 05/01/2021   LAMBDASER 15.2 05/01/2021   KAPLAMBRATIO 3.97 (H) 05/01/2021    RADIOGRAPHIC STUDIES: I have personally reviewed the radiological images as listed and agreed with the findings in the report. CT BONE MARROW BIOPSY & ASPIRATION  Result Date: 05/15/2021 CLINICAL DATA:  History of multiple myeloma with possible relapse due to increased light chains. Need for bone marrow biopsy for further evaluation. EXAM: CT GUIDED BONE MARROW ASPIRATION AND BIOPSY ANESTHESIA/SEDATION: Versed 2.0 mg IV, Fentanyl 100 mcg IV Total Moderate Sedation Time:   13 minutes. The patient's level of consciousness and physiologic  status were continuously monitored during the procedure by Radiology nursing. PROCEDURE: The procedure risks, benefits, and alternatives were explained to the patient. Questions regarding the procedure were encouraged and answered. The patient understands and consents to the procedure. A time out was performed prior to initiating the procedure. The right gluteal region was prepped with chlorhexidine. Sterile gown and sterile gloves were used for the procedure. Local anesthesia was provided with 1% Lidocaine. Under CT guidance, an 11 gauge On Control bone cutting needle was advanced from a posterior approach into the right iliac bone. Needle positioning was confirmed with CT. Initial non heparinized and heparinized aspirate samples were obtained of bone marrow. Core biopsy was performed via the On Control drill needle. Two separate core biopsy samples were obtained. COMPLICATIONS: None FINDINGS: Inspection of initial aspirate did reveal visible particles. Intact core biopsy samples were obtained. IMPRESSION: CT guided bone marrow biopsy of  right posterior iliac bone with both aspirate and core samples obtained. Electronically Signed   By: Aletta Edouard M.D.   On: 05/15/2021 10:51      ASSESSMENT & PLAN:  1. Multiple myeloma in relapse ([place])   2. Encounter for antineoplastic chemotherapy   3. Thrombocytopenia ([facility])   4. Stage 3a chronic kidney disease ([place])   5. Bone lesion   6. Chronic midline low back pain without sciatica    #Light chain multiple myeloma beta 2 microglobulin 5 and normal albumin.  Stage II.cytogenetics showed normal male chromosome, MDS FISH panel showed trisomy 37- Standard Risk.S/p RVD x 9 and  Autologous bone marrow stem [facility] transplant at [facility] on 04/23/2019. 04/03/2021, light chain ratio increased to 3.76.   05/15/2021 bone marrow biopsy showed slightly hypercellular bone marrow for age with slight plasmacytosis.  5% plasma cells with kappa light chain restriction.  Cytogenetics  normal.  Myeloma FISH negative Status post cycle 1 day 1 daratumumab treatment on 05/31/2021.  Grade 2 infusion reaction.  Patient was advised to take 3 days premeds prior to the next dose of daratumumab. Take for 3 days *Singulair-prescription sent/take as directed *Tylenol 1 pill twice a day [OTC] *famotidine 10 mg twice daily [OTC] *Claritin 1 pill a day [OTC] Labs are reviewed and discussed with patient.  Proceed with Daratumumab IV infusion on 06/07/2021.  If he is able to tolerate this treatment, plan to switch to injection for next treatment. Continue Pomalyst 2 mg 3 weeks on 1 week off  # Thrombocytopenia, monitor closely.  Platelet count 76,000. # CKD, creatinine is stable.  # Mild anemia, stable.  # Macrocytic anemia, check B12 and folate.  #Back pain, I will repeat MRI lumbar spine with and without contrast #Heart pounding physical examination reviewed Regula beats and rhythm.  If persistent symptoms, consider echocardiogram.  Post bone marrow transplant care He has completed 1 year of acyclovir.  Off acyclovir now. Post transplant vaccination finished at [facility].   Post bone marrow transplant care, He has completed 1 year of acyclovir.  Off acyclovir now. Post transplant vaccination at [facility].   #Bone health/osteopenia/multiple myeloma-  Continue Xgeva monthly.- next 06/30/21 Continue calcium and vitamin D supplementation. Follow up 1 week for evaluation prior to starting next Daratumumab treatment.  Earlie Server, MD, PhD Hematology Oncology [facility] 06/05/2021

## 2021-06-05 NOTE — Progress Notes (Signed)
Patient here for follow up. Pt report fatigue and increased shortness of breath. Pt also states that he can feel his heart beat.

## 2021-06-07 ENCOUNTER — Inpatient Hospital Stay: Payer: Medicare Other

## 2021-06-07 ENCOUNTER — Encounter: Payer: Self-pay | Admitting: Oncology

## 2021-06-07 ENCOUNTER — Inpatient Hospital Stay: Payer: Medicare Other | Admitting: Pharmacist

## 2021-06-07 VITALS — BP 123/70 | HR 75 | Temp 97.6°F | Resp 18

## 2021-06-07 DIAGNOSIS — C9000 Multiple myeloma not having achieved remission: Secondary | ICD-10-CM

## 2021-06-07 DIAGNOSIS — Z5112 Encounter for antineoplastic immunotherapy: Secondary | ICD-10-CM | POA: Diagnosis not present

## 2021-06-07 MED ORDER — ACETAMINOPHEN 325 MG PO TABS
650.0000 mg | ORAL_TABLET | Freq: Once | ORAL | Status: AC
  Administered 2021-06-07: 650 mg via ORAL
  Filled 2021-06-07: qty 2

## 2021-06-07 MED ORDER — MONTELUKAST SODIUM 10 MG PO TABS
10.0000 mg | ORAL_TABLET | Freq: Once | ORAL | Status: AC
  Administered 2021-06-07: 10 mg via ORAL
  Filled 2021-06-07: qty 1

## 2021-06-07 MED ORDER — SODIUM CHLORIDE 0.9 % IV SOLN
16.0000 mg/kg | Freq: Once | INTRAVENOUS | Status: AC
  Administered 2021-06-07: 1500 mg via INTRAVENOUS
  Filled 2021-06-07: qty 60

## 2021-06-07 MED ORDER — SODIUM CHLORIDE 0.9 % IV SOLN
Freq: Once | INTRAVENOUS | Status: AC
  Filled 2021-06-07: qty 250

## 2021-06-07 MED ORDER — DIPHENHYDRAMINE HCL 50 MG/ML IJ SOLN
50.0000 mg | Freq: Once | INTRAMUSCULAR | Status: AC
  Administered 2021-06-07: 50 mg via INTRAVENOUS
  Filled 2021-06-07: qty 1

## 2021-06-07 MED ORDER — SODIUM CHLORIDE 0.9 % IV SOLN
20.0000 mg | Freq: Once | INTRAVENOUS | Status: AC
  Administered 2021-06-07: 20 mg via INTRAVENOUS
  Filled 2021-06-07: qty 20

## 2021-06-07 NOTE — Patient Instructions (Signed)
[facility]   Discharge Instructions: Thank you for choosing [facility] to provide your oncology and hematology care.  If you have a lab appointment with the [facility], please go directly to the [facility] and check in at the registration area.  We strive to give you quality time with your provider. You may need to reschedule your appointment if you arrive late (15 or more minutes).  Arriving late affects you and other patients whose appointments are after yours.  Also, if you miss three or more appointments without notifying the office, you may be dismissed from the clinic at the provider's discretion.      For prescription refill requests, have your pharmacy contact our office and allow 72 hours for refills to be completed.    Today you received the following chemotherapy and/or immunotherapy agents: Daratumumab.      To help prevent nausea and vomiting after your treatment, we encourage you to take your nausea medication as directed.  BELOW ARE SYMPTOMS THAT SHOULD BE REPORTED IMMEDIATELY: *FEVER GREATER THAN 100.4 F (38 C) OR HIGHER *CHILLS OR SWEATING *NAUSEA AND VOMITING THAT IS NOT CONTROLLED WITH YOUR NAUSEA MEDICATION *UNUSUAL SHORTNESS OF BREATH *UNUSUAL BRUISING OR BLEEDING *URINARY PROBLEMS (pain or burning when urinating, or frequent urination) *BOWEL PROBLEMS (unusual diarrhea, constipation, pain near the anus) TENDERNESS IN MOUTH AND THROAT WITH OR WITHOUT PRESENCE OF ULCERS (sore throat, sores in mouth, or a toothache) UNUSUAL RASH, SWELLING OR PAIN  UNUSUAL VAGINAL DISCHARGE OR ITCHING   Items with * indicate a potential emergency and should be followed up as soon as possible or go to the Emergency Department if any problems should occur.  Please show the CHEMOTHERAPY ALERT CARD or IMMUNOTHERAPY ALERT CARD at check-in to the Emergency Department and triage nurse.  Should you have questions after your visit or  need to cancel or reschedule your appointment, please contact [facility]  [phone] and follow the prompts.  Office hours are 8:00 a.m. to 4:30 p.m. Monday - Friday. Please note that voicemails left after 4:00 p.m. may not be returned until the following business day.  We are closed weekends and major holidays. You have access to a nurse at all times for urgent questions. Please call the main number to the clinic [phone] and follow the prompts.  For any non-urgent questions, you may also contact your provider using MyChart. We now offer e-Visits for anyone 29 and older to request care online for non-urgent symptoms. For details visit [email or web address].   Also download the MyChart app! Go to the app store, search "MyChart", open the app, select [facility], and log in with your MyChart username and password.  Due to Covid, a mask is required upon entering the hospital/clinic. If you do not have a mask, one will be given to you upon arrival. For doctor visits, patients may have 1 support person aged 52 or older with them. For treatment visits, patients cannot have anyone with them due to current Covid guidelines and our immunocompromised population.

## 2021-06-07 NOTE — Addendum Note (Signed)
Addended by: Earlie Server on: 06/07/2021 08:20 AM   Modules accepted: Orders

## 2021-06-07 NOTE — Progress Notes (Signed)
Per MD patient experienced grade 2 reaction with first infusion. Patient has premedicated for 3 days before D8 infusion. Benadryl PO changed to IV. Will infuse D8 as initial infusion volume and titrate as tolerated.

## 2021-06-07 NOTE — Progress Notes (Signed)
[place]  [phone] [phone]  Patient Care Team: Tonia Ghent, MD as PCP - General (Family Medicine) Earlie Server, MD as Consulting Physician (Oncology) Beather Arbour Lilyan Punt, MD as Referring Physician (Hematology and Oncology) Diannia Ruder, MD as Referring Physician (Hematology and Oncology)   Name of the patient: Luis Burke  [id number]  September 04, 1956   Date of visit: 06/07/21  HPI: Patient is a 65 y.o. male with relapsed multiple myeloma s/p auto transplant in 2020. He was on ixazomib for maintenance therapy post transplant. Currently treatment with Pomalyst (pomalidomide), dartumumab, and dexamethasone.   Reason for Consult: Oral chemotherapy follow-up for pomalidomide therapy.   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   Fatty liver    on u/s 07/2018   Hyperlipidemia 03/2004   Hypertension 1994   Multiple myeloma ([place])    Snores     HEMATOLOGY/ONCOLOGY HISTORY:  Oncology History  Multiple myeloma ([place])  08/26/2018 Initial Diagnosis   Multiple myeloma ([place])   08/28/2018 - 03/17/2019 Chemotherapy   The patient had dexamethasone (DECADRON) 4 MG tablet, 1 of 1 cycle, Start date: --, End date: -- bortezomib SQ (VELCADE) chemo injection 2.75 mg, 1.3 mg/m2 = 2.75 mg, Subcutaneous,  Once, 10 of 10 cycles Administration: 2.75 mg (08/28/2018), 2.75 mg (09/04/2018), 2.75 mg (09/15/2018), 2.75 mg (09/22/2018), 2.75 mg (09/29/2018), 2.75 mg (10/06/2018), 2.75 mg (10/13/2018), 2.75 mg (10/20/2018), 2.75 mg (10/27/2018), 2.75 mg (11/03/2018), 2.75 mg (11/10/2018), 2.75 mg (11/17/2018), 2.75 mg (11/24/2018), 2.75 mg (12/01/2018), 2.75 mg (12/08/2018), 2.75 mg (12/16/2018), 2.75 mg (12/23/2018), 2.75 mg (12/30/2018), 2.75 mg (01/06/2019), 2.75 mg (01/13/2019),  2.75 mg (01/20/2019), 2.75 mg (01/27/2019), 2.75 mg (02/03/2019), 2.75 mg (02/10/2019), 2.75 mg (02/17/2019), 2.75 mg (02/24/2019), 2.75 mg (03/03/2019), 2.75 mg (03/10/2019), 2.75 mg (03/17/2019)   for chemotherapy treatment.     07/21/2019 - 07/21/2019 Chemotherapy          05/31/2021 -  Chemotherapy    Patient is on Treatment Plan: MYELOMA DARATUMUMAB + POMALIDOMIDE + DEXAMETHASONE Q28D X 7 CYCLES         ALLERGIES:  is allergic to bactrim [sulfamethoxazole-trimethoprim], clonidine derivatives, and tadalafil.  MEDICATIONS:  Current Outpatient Medications  Medication Sig Dispense Refill   acetaminophen (TYLENOL) 325 MG tablet Take 2 tablets (650 mg total) by mouth See admin instructions. 1 hour prior to chemotherapy treatments 30 tablet 0   acyclovir (ZOVIRAX) 400 MG tablet Take 400 mg by mouth 2 (two) times daily. (Patient not taking: No sig reported)     amLODipine (NORVASC) 5 MG tablet TAKE 2 TABLETS BY MOUTH EVERY DAY 180 tablet 1   B Complex Vitamins (VITAMIN B COMPLEX PO) SMARTSIG:By Mouth     clopidogrel (PLAVIX) 75 MG tablet Take by mouth. (Patient not taking: Reported on 06/05/2021)     co-enzyme Q-10 50 MG capsule Take 50 mg by mouth daily.     [facility] VITAMIN B12 1000 MCG tablet TAKE 1 TABLET BY MOUTH EVERY DAY 90 tablet 3   cyclobenzaprine (FLEXERIL) 5 MG tablet Take 1 tablet (5 mg total) by mouth 3 (three) times daily as needed for muscle spasms. (Patient not taking: No sig reported) 30 tablet 0   dexamethasone (DECADRON) 4 MG tablet Take 5 tablets (20 mg total) by mouth once a week. Take at least  1 hour prior to infusion appt. Take with food. To start on 06/07/21. 20 tablet 1   diphenhydrAMINE (BENADRYL) 50 MG tablet Take 1 tablet (50 mg total) by mouth See admin instructions. Take 1 tablet 1 hour prior to chemotherapy treatments. 30 tablet 0   hydrALAZINE (APRESOLINE) 10 MG tablet TAKE 1 TABLET (10 MG TOTAL) BY MOUTH 3 (THREE) TIMES DAILY. 270 tablet 1   lisinopril (ZESTRIL) 20 MG  tablet TAKE 1 TABLET BY MOUTH EVERY DAY. PLEASE MAKE APPOINTMENT FOR FURTHER REFILLS. 90 tablet 0   LORazepam (ATIVAN) 1 MG tablet TAKE 1 TABLET (1 MG TOTAL) BY MOUTH EVERY 6 (SIX) HOURS AS NEEDED FOR ANXIETY (FOR NAUSEA)     magnesium chloride (SLOW-MAG) 64 MG TBEC SR tablet Take 1 tablet (64 mg total) by mouth daily. 60 tablet 0   metoprolol succinate (TOPROL-XL) 50 MG 24 hr tablet TAKE 4 TABLETS (200 MG TOTAL) BY MOUTH DAILY. TAKE WITH OR IMMEDIATELY FOLLOWING A MEAL. 360 tablet 2   montelukast (SINGULAIR) 10 MG tablet Take 1 tablet (10 mg total) by mouth See admin instructions. Take 1 hour prior to chemotherapy 30 tablet 0   ondansetron (ZOFRAN) 8 MG tablet Take 1 tablet (8 mg total) by mouth every 8 (eight) hours as needed. for nausea 30 tablet 1   pomalidomide (POMALYST) 2 MG capsule Take 1 capsule (2 mg total) by mouth daily. Take for 21 days on, then hold for 7 days. Repeat every 28 days. 21 capsule 0   pravastatin (PRAVACHOL) 10 MG tablet TAKE 1 TABLET BY MOUTH EVERY DAY 90 tablet 3   prochlorperazine (COMPAZINE) 10 MG tablet TAKE 1 TABLET (10 MG TOTAL) BY MOUTH EVERY 6 (SIX) HOURS AS NEEDED FOR NAUSEA (Patient not taking: No sig reported)     zinc gluconate 50 MG tablet Take 50 mg by mouth daily.     No current facility-administered medications for this visit.    VITAL SIGNS: There were no vitals taken for this visit. There were no vitals filed for this visit.  Estimated body mass index is 29.56 kg/m as calculated from the following:   Height as of 05/15/21: 5' 10"  (1.778 m).   Weight as of 06/05/21: 93.4 kg (206 lb).  LABS: CBC:    Component Value Date/Time   WBC 3.9 (L) 06/05/2021 1437   HGB 13.2 06/05/2021 1437   HCT 38.8 (L) 06/05/2021 1437   PLT 76 (L) 06/05/2021 1437   MCV 100.5 (H) 06/05/2021 1437   NEUTROABS 2.4 06/05/2021 1437   LYMPHSABS 1.0 06/05/2021 1437   MONOABS 0.4 06/05/2021 1437   EOSABS 0.1 06/05/2021 1437   BASOSABS 0.0 06/05/2021 1437   Comprehensive  Metabolic Panel:    Component Value Date/Time   NA 136 06/05/2021 1437   K 3.7 06/05/2021 1437   CL 100 06/05/2021 1437   CO2 27 06/05/2021 1437   BUN 23 06/05/2021 1437   CREATININE 1.36 (H) 06/05/2021 1437   GLUCOSE 130 (H) 06/05/2021 1437   CALCIUM 9.9 06/05/2021 1437   AST 22 06/05/2021 1437   ALT 49 (H) 06/05/2021 1437   ALKPHOS 77 06/05/2021 1437   BILITOT 0.6 06/05/2021 1437   PROT 7.4 06/05/2021 1437   ALBUMIN 4.4 06/05/2021 1437     Present during today's visit: patient only, seen in infusion  Assessment and Plan: C1/D8 Continue pomalidomide 56m 1 days on/7 days off. Mr. MLawlessreports taking acyclovir and aspirin at home.   Oral Chemotherapy Side Effect/Intolerance:  Fatigue: Mr. MWannreported  that on Sunday he took his granddaughter to the mall and walked about 3 miles. He had to rest during the trip and also had the rest the next day. After one day of rest he was able to go about his normal daily activities.  Nausea: one incidence of nausea which occurred this morning, he took one ondansetron and it resolved. No reported diarrhea, constipation, or rash  Oral Chemotherapy Adherence: no missed doses reported No patient barriers to medication adherence identified.   New medications: none reported   Medication Access Issues: no issues, fills at [facility]  Patient expressed understanding and was in agreement with this plan. He also understands that He can call clinic at any time with any questions, concerns, or complaints.   Follow-up plan: Plan to see patient with start of cycle 2, 06/28/21  Thank you for allowing me to participate in the care of this very pleasant patient.   Time Total: 15 mins  Visit consisted of counseling and education on dealing with issues of symptom management in the setting of serious and potentially life-threatening illness.Greater than 50%  of this time was spent counseling and coordinating care related to the above assessment  and plan.  Signed by: Darl Pikes, PharmD, BCPS, Salley Slaughter, CPP Hematology/Oncology Clinical Pharmacist Practitioner [facility]/HP/AP [facility] [phone]  06/07/2021 11:23 AM

## 2021-06-08 ENCOUNTER — Encounter: Payer: Self-pay | Admitting: Pharmacist

## 2021-06-12 ENCOUNTER — Encounter: Payer: Self-pay | Admitting: Oncology

## 2021-06-12 ENCOUNTER — Inpatient Hospital Stay: Payer: Medicare Other

## 2021-06-12 ENCOUNTER — Inpatient Hospital Stay (HOSPITAL_BASED_OUTPATIENT_CLINIC_OR_DEPARTMENT_OTHER): Payer: Medicare Other | Admitting: Oncology

## 2021-06-12 VITALS — BP 136/87 | HR 66 | Temp 98.7°F | Resp 18 | Wt 200.5 lb

## 2021-06-12 DIAGNOSIS — Z5112 Encounter for antineoplastic immunotherapy: Secondary | ICD-10-CM | POA: Diagnosis not present

## 2021-06-12 DIAGNOSIS — M545 Low back pain, unspecified: Secondary | ICD-10-CM | POA: Diagnosis not present

## 2021-06-12 DIAGNOSIS — C9002 Multiple myeloma in relapse: Secondary | ICD-10-CM

## 2021-06-12 DIAGNOSIS — D696 Thrombocytopenia, unspecified: Secondary | ICD-10-CM | POA: Diagnosis not present

## 2021-06-12 DIAGNOSIS — G8929 Other chronic pain: Secondary | ICD-10-CM

## 2021-06-12 DIAGNOSIS — C9000 Multiple myeloma not having achieved remission: Secondary | ICD-10-CM

## 2021-06-12 DIAGNOSIS — N1831 Chronic kidney disease, stage 3a: Secondary | ICD-10-CM

## 2021-06-12 DIAGNOSIS — Z5111 Encounter for antineoplastic chemotherapy: Secondary | ICD-10-CM

## 2021-06-12 LAB — CBC WITH DIFFERENTIAL/PLATELET
Abs Immature Granulocytes: 0.03 10*3/uL (ref 0.00–0.07)
Basophils Absolute: 0 10*3/uL (ref 0.0–0.1)
Basophils Relative: 0 %
Eosinophils Absolute: 0.1 10*3/uL (ref 0.0–0.5)
Eosinophils Relative: 1 %
HCT: 38.8 % — ABNORMAL LOW (ref 39.0–52.0)
Hemoglobin: 13.1 g/dL (ref 13.0–17.0)
Immature Granulocytes: 0 %
Lymphocytes Relative: 19 %
Lymphs Abs: 1.7 10*3/uL (ref 0.7–4.0)
MCH: 34 pg (ref 26.0–34.0)
MCHC: 33.8 g/dL (ref 30.0–36.0)
MCV: 100.8 fL — ABNORMAL HIGH (ref 80.0–100.0)
Monocytes Absolute: 1.1 10*3/uL — ABNORMAL HIGH (ref 0.1–1.0)
Monocytes Relative: 13 %
Neutro Abs: 5.8 10*3/uL (ref 1.7–7.7)
Neutrophils Relative %: 67 %
Platelets: 64 10*3/uL — ABNORMAL LOW (ref 150–400)
RBC: 3.85 MIL/uL — ABNORMAL LOW (ref 4.22–5.81)
RDW: 13.4 % (ref 11.5–15.5)
WBC: 8.7 10*3/uL (ref 4.0–10.5)
nRBC: 0 % (ref 0.0–0.2)

## 2021-06-12 LAB — COMPREHENSIVE METABOLIC PANEL
ALT: 30 U/L (ref 0–44)
AST: 16 U/L (ref 15–41)
Albumin: 4.5 g/dL (ref 3.5–5.0)
Alkaline Phosphatase: 59 U/L (ref 38–126)
Anion gap: 9 (ref 5–15)
BUN: 26 mg/dL — ABNORMAL HIGH (ref 8–23)
CO2: 27 mmol/L (ref 22–32)
Calcium: 9.8 mg/dL (ref 8.9–10.3)
Chloride: 99 mmol/L (ref 98–111)
Creatinine, Ser: 1.75 mg/dL — ABNORMAL HIGH (ref 0.61–1.24)
GFR, Estimated: 43 mL/min — ABNORMAL LOW (ref >60)
Glucose, Bld: 105 mg/dL — ABNORMAL HIGH (ref 70–99)
Potassium: 4.6 mmol/L (ref 3.5–5.1)
Sodium: 135 mmol/L (ref 135–145)
Total Bilirubin: 0.8 mg/dL (ref 0.3–1.2)
Total Protein: 7.3 g/dL (ref 6.5–8.1)

## 2021-06-12 MED ORDER — LOPERAMIDE HCL 2 MG PO CAPS: 2.0000 mg | ORAL_CAPSULE | ORAL | 0 refills | Status: DC

## 2021-06-12 NOTE — Progress Notes (Signed)
Patient here for for follow up. Pt reports that she started to have diarrhea today after taking premeds.

## 2021-06-12 NOTE — Progress Notes (Signed)
Hematology/Oncology  Follow up note [facility] [phone] [phone]) [phone]   Patient Care Team: Tonia Ghent, MD as PCP - General (Family Medicine) Earlie Server, MD as Consulting Physician (Oncology) Beather Arbour Lilyan Punt, MD as Referring Physician (Hematology and Oncology) Diannia Ruder, MD as Referring Physician (Hematology and Oncology)  REASON FOR VISIT Follow up for multiple myeloma.   HISTORY OF PRESENTING ILLNESS:  Luis Burke is a  65 y.o.  male with PMH listed below presents for follow up of multiple myeloma.  # 07/27/2018 multiple myeloma panel showed M protein of 0.1, IgG 662, IgA 49, IgM 7. Patient was called back to further labs done. 08/10/2018, free light chain ratio showed extremely high level of kappa free light chain 10,183, with a kappa lambda light chain ratio of 1414.31 LDH 164 Beta-2 microglobulin 5 Patient was called and discuss about results.  He was recommended to undergo bone marrow biopsy and PET scan. 08/19/2018 bone marrow biopsy showed hypercellular marrow 80%, involved by plasma cell neoplasm up to 95%.  Consistent with plasma cell myeloma. Myeloma FISH  gain of gain of CEP12  09/10/2018 skeletal survey showed questionable small lucent lesions within the midshaft of the humerus bilaterally.  Otherwise no suspicious focal lytic lesion or acute bone abnormality. 08/25/2018 PET scan showed no definite hypermetabolic bone disease but CT findings are highly suspicious for numerous small myelomatous lesions involving the spine, sternum and scattered ribs.  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 He received preparative regimen with melphalan 200 mg/m on 04/22/2019 followed by autologous stem cell infusion on 04/23/2019. Currently he is transfusion independent.  Transfusion criteria with as needed hemoglobin less than 7.5 for platelet less than 10,000.  Transplant course was complicated with  febrile neutropenia grade 3, treated with vancomycin and cephapirin until engraftment on 05/06/2019. Patient also had engraftment syndrome and had to be started on Solu-Medrol 25 mg twice daily on 05/05/2019.  Transition to Medrol Dosepak on 05/07/2019 to complete steroid taper as outpatient.  Patient is currently on acyclovir for 1 year after transplant, dose was reduced on 722 11/20/2018 due to renal function. Patient had a baseline CKD with creatinine running between 1.4-1.6.  He had acute on chronic kidney injury and creatinine went up to 2.2 on 722.  Fluid hydration was given and creatinine decreased to baseline 1.5-1.7. His Hickman catheter was removed.  Candida Cruris treated with topical clotrimazole 1% 3 times daily and to resolution..  #  seen by Dr. Tonia Brooms BMT team on 06/30/2019.revaccination at [facility] begins at 12 months post transplant I discussed with [facility] hematology Dr.Choi and he recommends plans as listed below.  -patient to be started on Ixazomib maintenance: -Ixazomib 77m on D1/D8/D15 out of 28 day cycle.  -patient will start re-vaccination 12 months post transplant.  -Post transplant bone marrow biopsy if clinically indicated.  # seen by [facility] bone marrow transplant team in June 2021.  Patient has been started on immunization protocol.  He reports no new complaints.  #[facility]12[facility]  Lucent lesion in the L4 spinous process measuring 4 mm which is  nonspecific. Consider confirmation of marrow replacing process with MRI.  2.  Incompletely characterized renal cysts   # He continues to have chronic back pain and neck pain.  [facility] MRI lumbar spine with and without contrast showed no evidence of spinal metastasis.  Lumbar spondylosis is most pronounced  at L5-S1 where there is severe right and moderate left neural foraminal stenosis. 08/11/2020, x-ray cervical spine complete with flexion and extension showed The cervical spine  is visualized from C1 to the top of T1 on the lateral  view.  Reversal of the normal cervical lordosis with a focal kyphosis centered at the C5-C6 intervertebral level. Trace anterolisthesis of C4 onto C5. Trace retrolisthesis of C6 on C7. No evidence of dynamic instability is noted on  the flexion or extension views.   No prevertebral soft tissue swelling. Cervical vertebral body heights are maintained. Multilevel degenerative changes of the cervical spine including discogenic disease, most pronounced at C5-C6 and C6-C7, and facet arthropathy most pronounced at C4-C5. Multilevel mild to moderate left neural foraminal osseous narrowing of the mid to lower cervical vertebrae, possibly centrally/artifactual by patient positioning.  #07/28/2020 bone marrow normocellular marrow with polytypic plasmacytosis, 3 to 8%   Patient is in remission.  Cytogenetics negative for trisomy 12.  Negative myeloma FISH panel.   # Lumbar lesion,  11/13/20 [facility]  MRI cervical sine w/wo contrast - no evidence of metastatic disease C3-4 severe spinal stenosis, left neural foraminal narrowing. C4-5 spinal canal stenosis. [facility] MRI results showed no metastatic spine lesion. Chronic back pain and neck pain, images are consistent with chronic degenerative disease.  Patient continues to follow-up with [facility] orthopedic surgeon.  04/03/2021, light chain ratio increased to 3.76.   05/15/2021 bone marrow biopsy showed slightly hypercellular bone marrow for age with slight plasmacytosis.  5% plasma cells with kappa light chain restriction.  Cytogenetics normal.  Myeloma FISH negative # 05/15/2021 bone marrow biopsy showed slightly hypercellular bone marrow for age with slight plasmacytosis.  5% plasma cells with kappa light chain restriction.  Cytogenetics normal.  Myeloma FISH negative  # 05/31/2021 Daratumumab -Pomalyst-dexamethasone-D1 patient had grade 2 infusion reaction to daratumumab and received steroids, Benadryl, famotidine.  He was  able to finish treatment.D8 was able to finish Daratumumab with no infusion reactions.  INTERVAL HISTORY Luis Burke is a 65 y.o. male who has above history reviewed by me today for follow-up  Started on Daratumumab Pomalyst dexamethasone last week,  Patient reports feeling well.  Denies any skin rash, wheezing, shortness of breath. He feels a bit " loopy" today.  Patient has had 1 episode of loose bowel movement today.  Denies any abdominal pain.  Review of Systems  Constitutional:  Negative for appetite change, chills, fatigue, fever and unexpected weight change.  HENT:   Negative for hearing loss and voice change.   Eyes:  Negative for eye problems and icterus.  Respiratory:  Negative for chest tightness, cough and shortness of breath.   Cardiovascular:  Negative for chest pain and leg swelling.  Gastrointestinal:  Negative for abdominal distention and abdominal pain.  Endocrine: Negative for hot flashes.  Genitourinary:  Negative for difficulty urinating, dysuria and frequency.   Musculoskeletal:  Positive for back pain. Negative for arthralgias and neck pain.  Skin:  Negative for itching and rash.  Neurological:  Negative for light-headedness and numbness.  Hematological:  Negative for adenopathy. Does not bruise/bleed easily.  Psychiatric/Behavioral:  Negative for confusion.    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   Fatty liver    on u/s 07/2018   Hyperlipidemia 03/2004   Hypertension 1994  Multiple myeloma (HCC)    Snores     SURGICAL HISTORY: Past Surgical History:  Procedure Laterality Date   CATARACT EXTRACTION W/PHACO Right 03/04/2018   Procedure: CATARACT EXTRACTION PHACO AND INTRAOCULAR LENS PLACEMENT ([place])  RIGHT TORIC;  Surgeon: Leandrew Koyanagi, MD;  Location: [place];   Service: Ophthalmology;  Laterality: Right;  [place] no Toric Lens 1:45 5.3   COLONOSCOPY WITH PROPOFOL N/A 07/20/2018   Procedure: COLONOSCOPY WITH PROPOFOL;  Surgeon: Jonathon Bellows, MD;  [facility] END[facility];  Service: Gastroenterology;  Laterality: N/A;   ESOPHAGOGASTRODUODENOSCOPY (EGD) WITH PROPOFOL N/A 07/20/2018   Procedure: ESOPHAGOGASTRODUODENOSCOPY (EGD) WITH PROPOFOL;  Surgeon: Jonathon Bellows, MD;  Location: [facility] ENDOSCOPY;  Service: Gastroenterology;  Laterality: N/A;   EYE SURGERY Right    HERNIA REPAIR     L Lap herniorraphy  04/2000   Left wrist ganglionectomy      SOCIAL HISTORY: Social History   Socioeconomic History   Marital status: Single    Spouse name: Not on file   Number of children: 1   Years of education: Not on file   Highest education level: Not on file  Occupational History   Occupation: [employer]  Tobacco Use   Smoking status: Never   Smokeless tobacco: Never   Tobacco comments:    Occassionally  Vaping Use   Vaping Use: Never used  Substance and Sexual Activity   Alcohol use: Not Currently    Alcohol/week: 3.0 standard drinks    Types: 3 Cans of beer per week   Drug use: No   Sexual activity: Not on file  Other Topics Concern   Not on file  Social History Narrative   Divorced 12/24/2007, dating as of December 23, 2017   His daughter died 4 days after giving birth to the patient's granddaughter   Has joint custody of his dead daughter's child   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 Strain: Not on [employer] Insecurity: Not on file  Transportation Needs: Not on file  Physical Activity: Not on file  Stress: Not on file  Social Connections: Not on file  Intimate Partner Violence: Not on file    FAMILY HISTORY: Family History  Problem Relation Age of Onset   Hypertension Mother    Diabetes Mother    Heart disease Mother        CAD   Dementia Mother    Hypertension Father    Heart disease Father         MI 02/03   Colon cancer Father    Hypertension Sister    Hypertension Sister    Hypertension Sister    Prostate cancer Neg Hx     ALLERGIES:  is allergic to bactrim [sulfamethoxazole-trimethoprim], clonidine derivatives, and tadalafil.  MEDICATIONS:  Current Outpatient Medications  Medication Sig Dispense Refill   acetaminophen (TYLENOL) 325 MG tablet Take 2 tablets (650 mg total) by mouth See admin instructions. 1 hour prior to chemotherapy treatments 30 tablet 0   amLODipine (NORVASC) 5 MG tablet TAKE 2 TABLETS BY MOUTH EVERY DAY 180 tablet 1   ASPIRIN 81 PO Take 81 mg by mouth daily.     B Complex Vitamins (VITAMIN B COMPLEX PO) SMARTSIG:By Mouth     co-enzyme Q-10 50 MG capsule Take 50 mg by mouth daily.     [employer] VITAMIN B12 1000 MCG tablet TAKE 1 TABLET BY MOUTH EVERY DAY 90 tablet 3   dexamethasone (DECADRON) 4 MG  tablet Take 5 tablets (20 mg total) by mouth once a week. Take at least 1 hour prior to infusion appt. Take with food. To start on 06/07/21. 20 tablet 1   diphenhydrAMINE (BENADRYL) 50 MG tablet Take 1 tablet (50 mg total) by mouth See admin instructions. Take 1 tablet 1 hour prior to chemotherapy treatments. 30 tablet 0   hydrALAZINE (APRESOLINE) 10 MG tablet TAKE 1 TABLET (10 MG TOTAL) BY MOUTH 3 (THREE) TIMES DAILY. 270 tablet 1   lisinopril (ZESTRIL) 20 MG tablet TAKE 1 TABLET BY MOUTH EVERY DAY. PLEASE MAKE APPOINTMENT FOR FURTHER REFILLS. 90 tablet 0   loperamide (IMODIUM) 2 MG capsule Take 1 capsule (2 mg total) by mouth See admin instructions. Take 2 capsules at the onset of diarrhea, then 1 capsule every 2 hours or after every loose bowel movement. Maximum 8 capsules per 24 hours. 60 capsule 0   LORazepam (ATIVAN) 1 MG tablet TAKE 1 TABLET (1 MG TOTAL) BY MOUTH EVERY 6 (SIX) HOURS AS NEEDED FOR ANXIETY (FOR NAUSEA)     magnesium chloride (SLOW-MAG) 64 MG TBEC SR tablet Take 1 tablet (64 mg total) by mouth daily. 60 tablet 0   metoprolol succinate (TOPROL-XL)  50 MG 24 hr tablet TAKE 4 TABLETS (200 MG TOTAL) BY MOUTH DAILY. TAKE WITH OR IMMEDIATELY FOLLOWING A MEAL. 360 tablet 2   montelukast (SINGULAIR) 10 MG tablet Take 1 tablet (10 mg total) by mouth See admin instructions. Take 1 hour prior to chemotherapy 30 tablet 0   ondansetron (ZOFRAN) 8 MG tablet Take 1 tablet (8 mg total) by mouth every 8 (eight) hours as needed. for nausea 30 tablet 1   pomalidomide (POMALYST) 2 MG capsule Take 1 capsule (2 mg total) by mouth daily. Take for 21 days on, then hold for 7 days. Repeat every 28 days. 21 capsule 0   pravastatin (PRAVACHOL) 10 MG tablet TAKE 1 TABLET BY MOUTH EVERY DAY 90 tablet 3   zinc gluconate 50 MG tablet Take 50 mg by mouth daily.     acyclovir (ZOVIRAX) 400 MG tablet Take 400 mg by mouth 2 (two) times daily. (Patient not taking: No sig reported)     clopidogrel (PLAVIX) 75 MG tablet Take by mouth. (Patient not taking: No sig reported)     cyclobenzaprine (FLEXERIL) 5 MG tablet Take 1 tablet (5 mg total) by mouth 3 (three) times daily as needed for muscle spasms. (Patient not taking: No sig reported) 30 tablet 0   prochlorperazine (COMPAZINE) 10 MG tablet TAKE 1 TABLET (10 MG TOTAL) BY MOUTH EVERY 6 (SIX) HOURS AS NEEDED FOR NAUSEA (Patient not taking: No sig reported)     No current facility-administered medications for this visit.     PHYSICAL EXAMINATION: ECOG PERFORMANCE STATUS: 1 - Symptomatic but completely ambulatory Vitals:   06/12/21 1311  BP: 136/87  Pulse: 66  Resp: 18  Temp: 98.7 F (37.1 C)   Filed Weights   06/12/21 1311  Weight: 200 lb 8 oz (90.9 kg)    Physical Exam Constitutional:      General: He is not in acute distress. HENT:     Head: Normocephalic and atraumatic.  Eyes:     General: No scleral icterus. Cardiovascular:     Rate and Rhythm: Normal rate and regular rhythm.     Heart sounds: Normal heart sounds.  Pulmonary:     Effort: Pulmonary effort is normal. No respiratory distress.     Breath  sounds: No wheezing.  Abdominal:  General: Bowel sounds are normal. There is no distension.     Palpations: Abdomen is soft.  Musculoskeletal:        General: No deformity. Normal range of motion.     Cervical back: Normal range of motion and neck supple.  Skin:    General: Skin is warm and dry.     Findings: No erythema or rash.  Neurological:     Mental Status: He is alert and oriented to person, place, and time. Mental status is at baseline.     Cranial Nerves: No cranial nerve deficit.     Coordination: Coordination normal.  Psychiatric:        Mood and Affect: Mood normal.     LABORATORY DATA:  I have reviewed the data as listed Lab Results  Component Value Date   WBC 8.7 06/12/2021   HGB 13.1 06/12/2021   HCT 38.8 (L) 06/12/2021   MCV 100.8 (H) 06/12/2021   PLT 64 (L) 06/12/2021   Recent Labs    06/22/20 0847 07/20/20 0844 05/29/21 0901 06/05/21 1437 06/12/21 1249  NA 139   < > 139 136 135  K 4.5   < > 4.7 3.7 4.6  CL 103   < > 103 100 99  CO2 26   < > $R'29 27 27  'Sg$ GLUCOSE 104*   < > 107* 130* 105*  BUN 24*   < > 26* 23 26*  CREATININE 1.55*   < > 1.49* 1.36* 1.75*  CALCIUM 9.8   < > 9.7 9.9 9.8  GFRNONAA 47*   < > 52* 58* 43*  GFRAA 54*  --   --   --   --   PROT 7.6   < > 7.4 7.4 7.3  ALBUMIN 4.3   < > 4.3 4.4 4.5  AST 24   < > $R'22 22 16  'xT$ ALT 24   < > 29 49* 30  ALKPHOS 59   < > 51 77 59  BILITOT 0.8   < > 1.0 0.6 0.8   < > = values in this interval not displayed.    Iron/TIBC/Ferritin/ %Sat    Component Value Date/Time   IRON 74 07/01/2018 0944   TIBC 250 07/01/2018 0944   FERRITIN 357 07/01/2018 0944   IRONPCTSAT 30 07/01/2018 0944    Lab Results  Component Value Date   TOTALPROTELP 7.0 05/01/2021   ALBUMINELP 4.0 03/08/2021   A1GS 0.2 03/08/2021   A2GS 0.7 03/08/2021   BETS 1.1 03/08/2021   GAMS 1.1 03/08/2021   MSPIKE Not Observed 03/08/2021   SPEI Comment 03/08/2021   Lab Results  Component Value Date   KPAFRELGTCHN 60.4 (H)  05/01/2021   LAMBDASER 15.2 05/01/2021   KAPLAMBRATIO 3.97 (H) 05/01/2021    RADIOGRAPHIC STUDIES: I have personally reviewed the radiological images as listed and agreed with the findings in the report. CT BONE MARROW BIOPSY & ASPIRATION  Result Date: 05/15/2021 CLINICAL DATA:  History of multiple myeloma with possible relapse due to increased light chains. Need for bone marrow biopsy for further evaluation. EXAM: CT GUIDED BONE MARROW ASPIRATION AND BIOPSY ANESTHESIA/SEDATION: Versed 2.0 mg IV, Fentanyl 100 mcg IV Total Moderate Sedation Time:   13 minutes. The patient's level of consciousness and physiologic status were continuously monitored during the procedure by Radiology nursing. PROCEDURE: The procedure risks, benefits, and alternatives were explained to the patient. Questions regarding the procedure were encouraged and answered. The patient understands and consents to the procedure. A time out  was performed prior to initiating the procedure. The right gluteal region was prepped with chlorhexidine. Sterile gown and sterile gloves were used for the procedure. Local anesthesia was provided with 1% Lidocaine. Under CT guidance, an 11 gauge On Control bone cutting needle was advanced from a posterior approach into the right iliac bone. Needle positioning was confirmed with CT. Initial non heparinized and heparinized aspirate samples were obtained of bone marrow. Core biopsy was performed via the On Control drill needle. Two separate core biopsy samples were obtained. COMPLICATIONS: None FINDINGS: Inspection of initial aspirate did reveal visible particles. Intact core biopsy samples were obtained. IMPRESSION: CT guided bone marrow biopsy of right posterior iliac bone with both aspirate and core samples obtained. Electronically Signed   By: Aletta Edouard M.D.   On: 05/15/2021 10:51      ASSESSMENT & PLAN:  1. Multiple myeloma in relapse ([place])   2. Encounter for antineoplastic chemotherapy   3.  Thrombocytopenia (HCC)   4. Stage 3a chronic kidney disease ([place])   5. Chronic midline low back pain without sciatica    #Light chain multiple myeloma beta 2 microglobulin 5 and normal albumin.  Stage II.cytogenetics showed normal male chromosome, MDS FISH panel showed trisomy 40- Standard Risk.S/p RVD x 9 and  Autologous bone marrow stem [facility] on 04[facility]23/2019.' Early relapse multiple myeloma. Labs reviewed and discussed with patient. Proceed with cycle 1 day 15 daratumumab/ Dexamethasone 20mg  PO  We will switch to subcutaneous daratumumab on 06/14/2021. Patient takes 3-day course of premeds prior to next dose of daratumumab. Take for 3 days *Singulair-daily *Tylenol 1 pill twice a day [OTC] *famotidine 10 mg twice daily [OTC] *Claritin 1 pill a day [OTC]Continue Pomalyst 2 mg 3 weeks on 1 week off  # Thrombocytopenia, monitor closely.  Platelet count 64,000. # CKD, creatinine has increased to 1.75.  Recommend patient to increase oral hydration.  Close monitor.  Avoid nephrotoxins.   #Back pain, I will repeat MRI lumbar spine with and without contrast-scheduled on 06/19/2021.  Post bone marrow transplant care He has completed 1 year of acyclovir.  Off acyclovir now. Post transplant vaccination finished at [facility].   #Bone health/osteopenia/multiple myeloma-  Continue Xgeva monthly.- next 06/30/21 Continue calcium and vitamin D supplementation. Follow up 1 week for evaluation prior to starting next Daratumumab treatment.  Earlie Server, MD, PhD Hematology Oncology [place] at [facility] 06/12/2021

## 2021-06-14 ENCOUNTER — Inpatient Hospital Stay: Payer: Medicare Other | Attending: Oncology

## 2021-06-14 VITALS — BP 110/72 | HR 66 | Temp 97.0°F | Resp 19 | Wt 203.0 lb

## 2021-06-14 DIAGNOSIS — I10 Essential (primary) hypertension: Secondary | ICD-10-CM | POA: Insufficient documentation

## 2021-06-14 DIAGNOSIS — D702 Other drug-induced agranulocytosis: Secondary | ICD-10-CM | POA: Diagnosis not present

## 2021-06-14 DIAGNOSIS — M899 Disorder of bone, unspecified: Secondary | ICD-10-CM | POA: Diagnosis not present

## 2021-06-14 DIAGNOSIS — C9000 Multiple myeloma not having achieved remission: Secondary | ICD-10-CM

## 2021-06-14 DIAGNOSIS — D696 Thrombocytopenia, unspecified: Secondary | ICD-10-CM | POA: Insufficient documentation

## 2021-06-14 DIAGNOSIS — N1831 Chronic kidney disease, stage 3a: Secondary | ICD-10-CM | POA: Diagnosis not present

## 2021-06-14 DIAGNOSIS — M8580 Other specified disorders of bone density and structure, unspecified site: Secondary | ICD-10-CM | POA: Insufficient documentation

## 2021-06-14 DIAGNOSIS — C9002 Multiple myeloma in relapse: Secondary | ICD-10-CM | POA: Insufficient documentation

## 2021-06-14 DIAGNOSIS — Z808 Family history of malignant neoplasm of other organs or systems: Secondary | ICD-10-CM | POA: Diagnosis not present

## 2021-06-14 DIAGNOSIS — Z5112 Encounter for antineoplastic immunotherapy: Secondary | ICD-10-CM | POA: Insufficient documentation

## 2021-06-14 DIAGNOSIS — Z9484 Stem cells transplant status: Secondary | ICD-10-CM | POA: Diagnosis not present

## 2021-06-14 MED ORDER — DEXAMETHASONE 4 MG PO TABS
20.0000 mg | ORAL_TABLET | Freq: Once | ORAL | Status: AC
  Administered 2021-06-14: 20 mg via ORAL
  Filled 2021-06-14: qty 5

## 2021-06-14 MED ORDER — ACETAMINOPHEN 325 MG PO TABS
650.0000 mg | ORAL_TABLET | Freq: Once | ORAL | Status: AC
  Administered 2021-06-14: 650 mg via ORAL
  Filled 2021-06-14: qty 2

## 2021-06-14 MED ORDER — SODIUM CHLORIDE 0.9 % IV SOLN
Freq: Once | INTRAVENOUS | Status: AC
  Filled 2021-06-14: qty 250

## 2021-06-14 MED ORDER — MONTELUKAST SODIUM 10 MG PO TABS
10.0000 mg | ORAL_TABLET | Freq: Once | ORAL | Status: AC
  Administered 2021-06-14: 10 mg via ORAL
  Filled 2021-06-14: qty 1

## 2021-06-14 MED ORDER — DARATUMUMAB-HYALURONIDASE-FIHJ 1800-30000 MG-UT/15ML ~~LOC~~ SOLN
1800.0000 mg | Freq: Once | SUBCUTANEOUS | Status: AC
  Administered 2021-06-14: 1800 mg via SUBCUTANEOUS
  Filled 2021-06-14: qty 15

## 2021-06-14 MED ORDER — DIPHENHYDRAMINE HCL 50 MG/ML IJ SOLN
50.0000 mg | Freq: Once | INTRAMUSCULAR | Status: AC
  Administered 2021-06-14: 50 mg via INTRAVENOUS
  Filled 2021-06-14: qty 1

## 2021-06-14 NOTE — Patient Instructions (Addendum)
[facility]  Discharge Instructions: Thank you for choosing [facility] to provide your oncology and hematology care.  If you have a lab appointment with the [facility], please go directly to the [facility] and check in at the registration area.  Wear comfortable clothing and clothing appropriate for easy access to any Portacath or PICC line.   We strive to give you quality time with your provider. You may need to reschedule your appointment if you arrive late (15 or more minutes).  Arriving late affects you and other patients whose appointments are after yours.  Also, if you miss three or more appointments without notifying the office, you may be dismissed from the clinic at the provider's discretion.      For prescription refill requests, have your pharmacy contact our office and allow 72 hours for refills to be completed.    Today you received the following chemotherapy and/or immunotherapy agents Darzalex  Daratumumab injection What is this medication? DARATUMUMAB (dar a toom ue mab) is a monoclonal antibody. It is used to treat multiple myeloma. This medicine may be used for other purposes; ask your health care provider or pharmacist if you have questions. COMMON BRAND NAME(S): DARZALEX What should I tell my care team before I take this medication? They need to know if you have any of these conditions: hereditary fructose intolerance infection (especially a virus infection such as chickenpox, herpes, or hepatitis B virus) lung or breathing disease (asthma, COPD) an unusual or allergic reaction to daratumumab, sorbitol, other medicines, foods, dyes, or preservatives pregnant or trying to get pregnant breast-feeding How should I use this medication? This medicine is for infusion into a vein. It is given by a health care professional in a hospital or clinic setting. Talk to your pediatrician regarding the use of this medicine in  children. Special care may be needed. Overdosage: If you think you have taken too much of this medicine contact a poison control center or emergency room at once. NOTE: This medicine is only for you. Do not share this medicine with others. What if I miss a dose? Keep appointments for follow-up doses as directed. It is important not to miss your dose. Call your doctor or health care professional if you are unable to keep an appointment. What may interact with this medication? Interactions have not been studied. This list may not describe all possible interactions. Give your health care provider a list of all the medicines, herbs, non-prescription drugs, or dietary supplements you use. Also tell them if you smoke, drink alcohol, or use illegal drugs. Some items may interact with your medicine. What should I watch for while using this medication? Your condition will be monitored carefully while you are receiving this medicine. This medicine can cause serious allergic reactions. To reduce your risk, your health care provider may give you other medicine to take before receiving this one. Be sure to follow the directions from your health care provider. This medicine can affect the results of blood tests to match your blood type. These changes can last for up to 6 months after the final dose. Your healthcare provider will do blood tests to match your blood type before you start treatment. Tell all of your healthcare providers that you are being treated with this medicine before receiving a blood transfusion. This medicine can affect the results of some tests used to determine treatment response; extra tests may be needed to evaluate response. Do not become pregnant  while taking this medicine or for 3 months after stopping it. Women should inform their health care provider if they wish to become pregnant or think they might be pregnant. There is a potential for serious side effects to an unborn child. Talk to  your health care provider for more information. Do not breast-feed an infant while taking this medicine. What side effects may I notice from receiving this medication? Side effects that you should report to your doctor or health care professional as soon as possible: allergic reactions (skin rash, itching, hives, swelling of the face, lips, or tongue) blurred vision infection (fever, chills, cough, sore throat, pain or difficulty passing urine) infusion reaction (dizziness, fast heartbeat, feeling faint or lightheaded, falls, headache, increase in blood pressure, nausea, vomiting, or wheezing or trouble breathing with loud or whistling sounds) unusual bleeding or bruising Side effects that usually do not require medical attention (report to your doctor or health care professional if they continue or are bothersome): constipation diarrhea pain, tingling, numbness in the hands or feet swelling of the ankles, feet, hands tiredness This list may not describe all possible side effects. Call your doctor for medical advice about side effects. You may report side effects to FDA at 1-800-FDA-1088. Where should I keep my medication? This drug is given in a hospital or clinic and will not be stored at home. NOTE: This sheet is a summary. It may not cover all possible information. If you have questions about this medicine, talk to your doctor, pharmacist, or health care provider.  2022 Elsevier/Gold Standard (2020-11-09 12:50:38)    To help prevent nausea and vomiting after your treatment, we encourage you to take your nausea medication as directed.  BELOW ARE SYMPTOMS THAT SHOULD BE REPORTED IMMEDIATELY: *FEVER GREATER THAN 100.4 F (38 C) OR HIGHER *CHILLS OR SWEATING *NAUSEA AND VOMITING THAT IS NOT CONTROLLED WITH YOUR NAUSEA MEDICATION *UNUSUAL SHORTNESS OF BREATH *UNUSUAL BRUISING OR BLEEDING *URINARY PROBLEMS (pain or burning when urinating, or frequent urination) *BOWEL PROBLEMS (unusual  diarrhea, constipation, pain near the anus) TENDERNESS IN MOUTH AND THROAT WITH OR WITHOUT PRESENCE OF ULCERS (sore throat, sores in mouth, or a toothache) UNUSUAL RASH, SWELLING OR PAIN  UNUSUAL VAGINAL DISCHARGE OR ITCHING   Items with * indicate a potential emergency and should be followed up as soon as possible or go to the Emergency Department if any problems should occur.  Please show the CHEMOTHERAPY ALERT CARD or IMMUNOTHERAPY ALERT CARD at check-in to the Emergency Department and triage nurse.  Should you have questions after your visit or need to cancel or reschedule your appointment, please contact [facility]  [phone] and follow the prompts.  Office hours are 8:00 a.m. to 4:30 p.m. Monday - Friday. Please note that voicemails left after 4:00 p.m. may not be returned until the following business day.  We are closed weekends and major holidays. You have access to a nurse at all times for urgent questions. Please call the main number to the clinic [phone] and follow the prompts.  For any non-urgent questions, you may also contact your provider using MyChart. We now offer e-Visits for anyone 69 and older to request care online for non-urgent symptoms. For details visit [email or web address].   Also download the MyChart app! Go to the app store, search "MyChart", open the app, select [facility], and log in with your MyChart username and password.  Due to Covid, a mask is required upon entering the hospital/clinic. If you do not  have a mask, one will be given to you upon arrival. For doctor visits, patients may have 1 support person aged 24 or older with them. For treatment visits, patients cannot have anyone with them due to current Covid guidelines and our immunocompromised population.

## 2021-06-14 NOTE — Progress Notes (Signed)
Pt tolerated Darzalex SQ injection well with no signs of complications. Patient observed for two hours post injection per protocol. No complications noted. Vitals stable at discharge. RN educated pt on the importance of notifying the clinic if any complications occur at home and when to seek emergency care. Pt verbalized understanding.Pt stable for discharge.   Luis Burke [employer]

## 2021-06-19 ENCOUNTER — Encounter: Payer: Self-pay | Admitting: Oncology

## 2021-06-19 ENCOUNTER — Ambulatory Visit
Admission: RE | Admit: 2021-06-19 | Discharge: 2021-06-19 | Disposition: A | Discharge status: Home / Self Care | Payer: Medicare Other | Source: Ambulatory Visit | Attending: Oncology | Admitting: Oncology

## 2021-06-19 ENCOUNTER — Inpatient Hospital Stay (HOSPITAL_BASED_OUTPATIENT_CLINIC_OR_DEPARTMENT_OTHER): Payer: Medicare Other | Admitting: Oncology

## 2021-06-19 ENCOUNTER — Inpatient Hospital Stay: Payer: Medicare Other

## 2021-06-19 ENCOUNTER — Other Ambulatory Visit: Payer: Self-pay

## 2021-06-19 VITALS — BP 143/87 | HR 67 | Temp 98.3°F | Resp 16 | Wt 201.0 lb

## 2021-06-19 DIAGNOSIS — C9000 Multiple myeloma not having achieved remission: Secondary | ICD-10-CM

## 2021-06-19 DIAGNOSIS — D696 Thrombocytopenia, unspecified: Secondary | ICD-10-CM | POA: Diagnosis not present

## 2021-06-19 DIAGNOSIS — N1831 Chronic kidney disease, stage 3a: Secondary | ICD-10-CM

## 2021-06-19 DIAGNOSIS — Z5111 Encounter for antineoplastic chemotherapy: Secondary | ICD-10-CM

## 2021-06-19 DIAGNOSIS — Z5112 Encounter for antineoplastic immunotherapy: Secondary | ICD-10-CM | POA: Diagnosis not present

## 2021-06-19 DIAGNOSIS — C9002 Multiple myeloma in relapse: Secondary | ICD-10-CM | POA: Insufficient documentation

## 2021-06-19 DIAGNOSIS — D701 Agranulocytosis secondary to cancer chemotherapy: Secondary | ICD-10-CM

## 2021-06-19 DIAGNOSIS — T451X5A Adverse effect of antineoplastic and immunosuppressive drugs, initial encounter: Secondary | ICD-10-CM

## 2021-06-19 LAB — CBC WITH DIFFERENTIAL/PLATELET
Abs Immature Granulocytes: 0.01 10*3/uL (ref 0.00–0.07)
Basophils Absolute: 0 10*3/uL (ref 0.0–0.1)
Basophils Relative: 0 %
Eosinophils Absolute: 0.1 10*3/uL (ref 0.0–0.5)
Eosinophils Relative: 4 %
HCT: 36 % — ABNORMAL LOW (ref 39.0–52.0)
Hemoglobin: 12.4 g/dL — ABNORMAL LOW (ref 13.0–17.0)
Immature Granulocytes: 0 %
Lymphocytes Relative: 39 %
Lymphs Abs: 0.9 10*3/uL (ref 0.7–4.0)
MCH: 34.6 pg — ABNORMAL HIGH (ref 26.0–34.0)
MCHC: 34.4 g/dL (ref 30.0–36.0)
MCV: 100.6 fL — ABNORMAL HIGH (ref 80.0–100.0)
Monocytes Absolute: 0.5 10*3/uL (ref 0.1–1.0)
Monocytes Relative: 20 %
Neutro Abs: 0.8 10*3/uL — ABNORMAL LOW (ref 1.7–7.7)
Neutrophils Relative %: 37 %
Platelets: 64 10*3/uL — ABNORMAL LOW (ref 150–400)
RBC: 3.58 MIL/uL — ABNORMAL LOW (ref 4.22–5.81)
RDW: 13.3 % (ref 11.5–15.5)
WBC: 2.3 10*3/uL — ABNORMAL LOW (ref 4.0–10.5)
nRBC: 0 % (ref 0.0–0.2)

## 2021-06-19 LAB — COMPREHENSIVE METABOLIC PANEL
ALT: 40 U/L (ref 0–44)
AST: 15 U/L (ref 15–41)
Albumin: 4.3 g/dL (ref 3.5–5.0)
Alkaline Phosphatase: 64 U/L (ref 38–126)
Anion gap: 9 (ref 5–15)
BUN: 22 mg/dL (ref 8–23)
CO2: 25 mmol/L (ref 22–32)
Calcium: 9.3 mg/dL (ref 8.9–10.3)
Chloride: 101 mmol/L (ref 98–111)
Creatinine, Ser: 1.12 mg/dL (ref 0.61–1.24)
GFR, Estimated: >60 mL/min (ref >60)
Glucose, Bld: 94 mg/dL (ref 70–99)
Potassium: 4.1 mmol/L (ref 3.5–5.1)
Sodium: 135 mmol/L (ref 135–145)
Total Bilirubin: 0.5 mg/dL (ref 0.3–1.2)
Total Protein: 7.1 g/dL (ref 6.5–8.1)

## 2021-06-19 IMAGING — MR MR LUMBAR SPINE WO/W CM
5 of 7 series · 34 of 48 positions shown · IV contrast (gadavist)
Comparison: None.

CLINICAL DATA: Back pain, multiple myeloma

EXAM:
MRI LUMBAR SPINE WITHOUT AND WITH CONTRAST
TECHNIQUE: Multiplanar and multiecho pulse sequences of the lumbar spine were
obtained without and with intravenous contrast.
CONTRAST:  9mL GADAVIST GADOBUTROL 1 MMOL/ML IV SOLN

[Series 5: T2 · sagittal · 4.0mm · 0.81mm/px · 6 of 17 slices shown (1 of 2)]
[im 1/17]
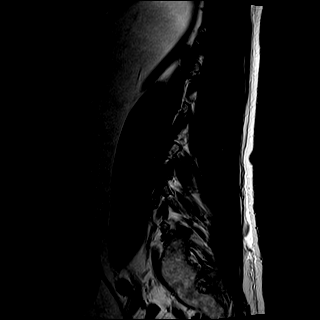
[im 4/17]
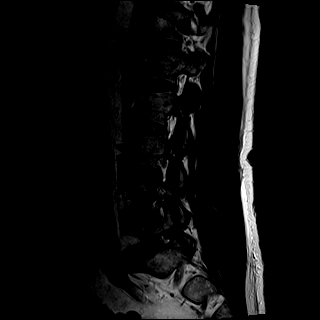
[im 7/17]
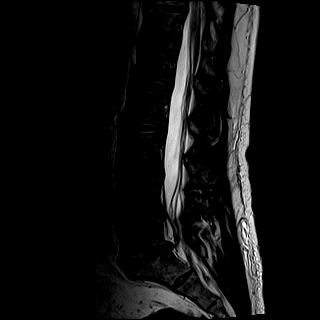
[im 10/17]
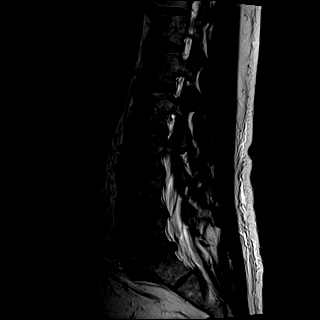
[im 13/17]
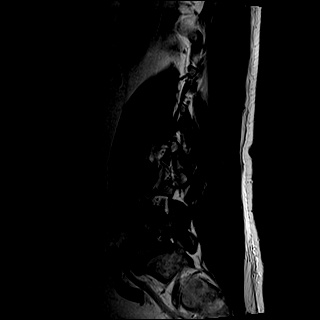
[im 17/17]
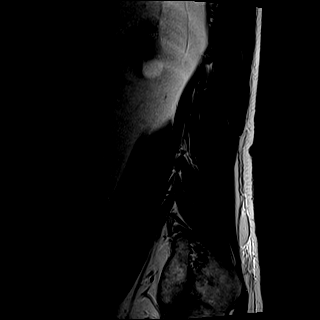

[Series 6: T1 · sagittal · 4.0mm · 0.81mm/px · 5 of 17 slices shown (1 of 2)]
[im 1/17]
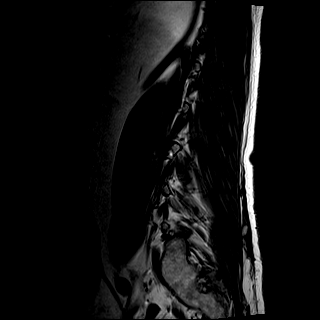
[im 5/17]
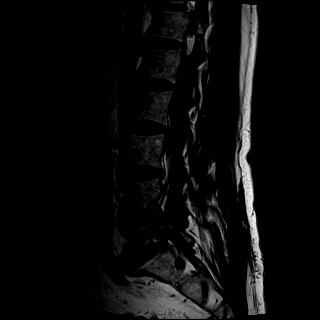
[im 9/17]
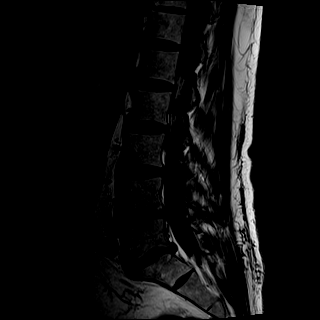
[im 13/17]
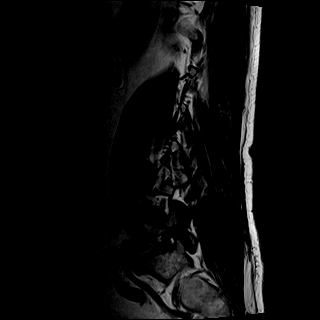
[im 17/17]
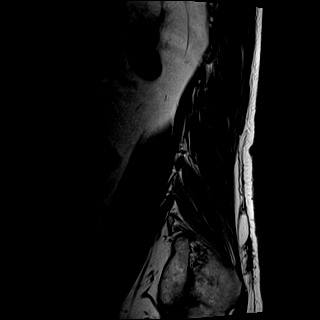

[Series 8: T2 · axial · 4.0mm · 0.78mm/px · z∈[-78,+99]mm · 9 of 29 slices shown (2 of 2)]
[im 1/29]
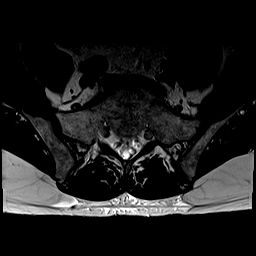
[im 4/29]
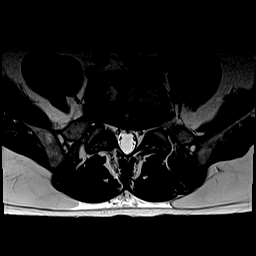
[im 8/29]
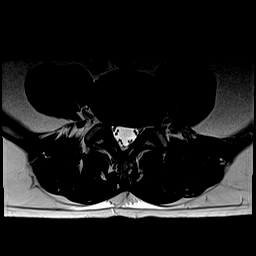
[im 11/29]
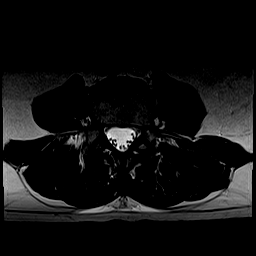
[im 15/29]
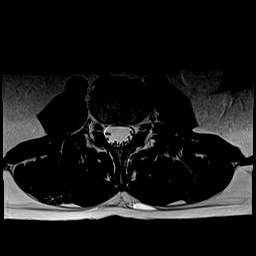
[im 18/29]
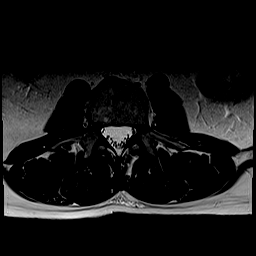
[im 22/29]
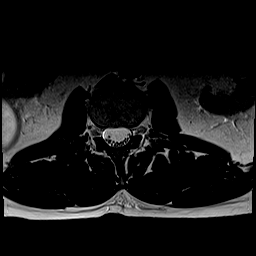
[im 25/29]
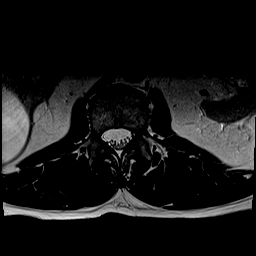
[im 29/29]
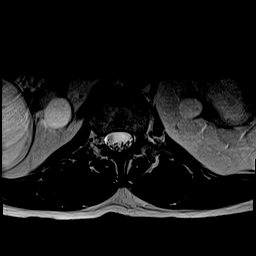

[Series 9: T1 · axial · 4.0mm · 0.39mm/px · z∈[-78,+99]mm · 9 of 29 slices shown (2 of 2)]
[im 1/29]
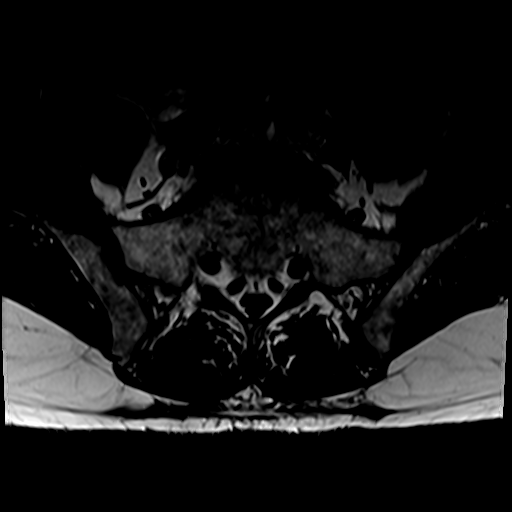
[im 4/29]
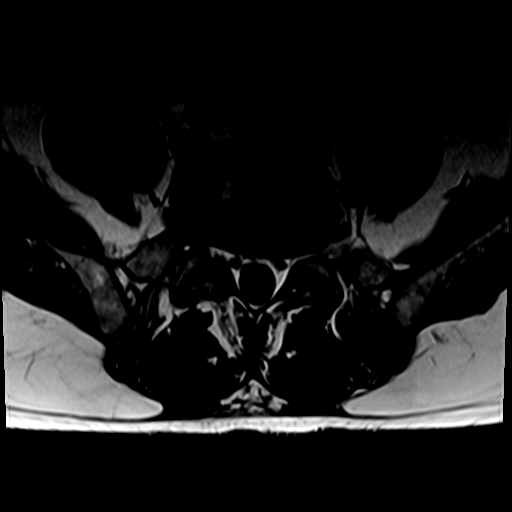
[im 8/29]
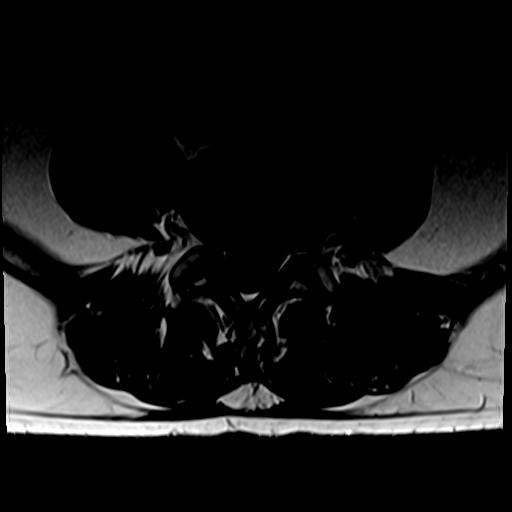
[im 11/29]
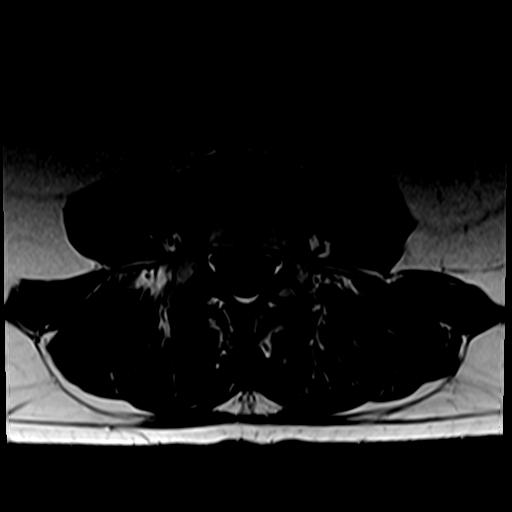
[im 15/29]
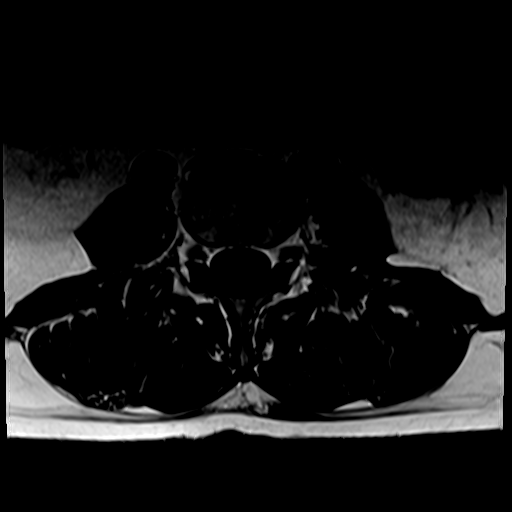
[im 18/29]
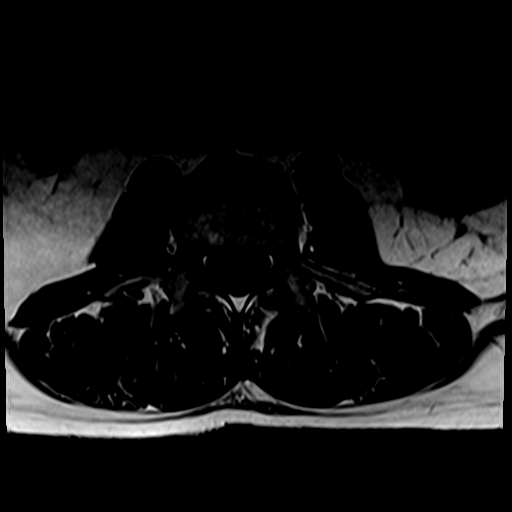
[im 22/29]
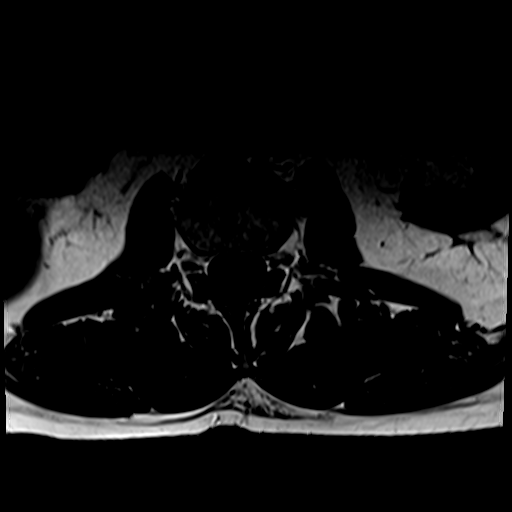
[im 25/29]
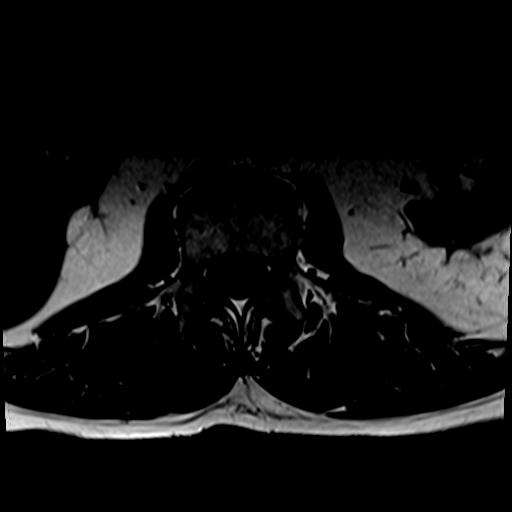
[im 29/29]
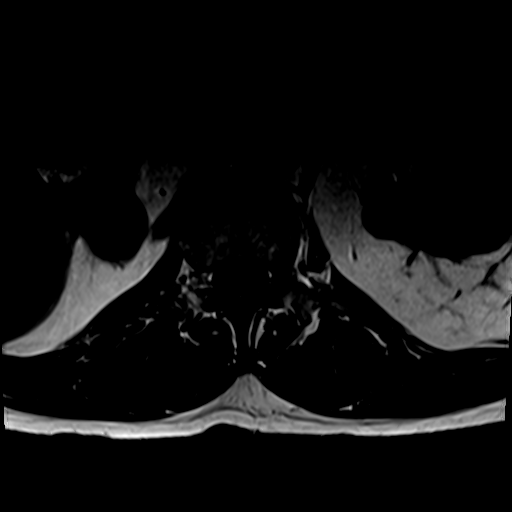

[Series 10: T1 fat-sat post-contrast · sagittal · 4.0mm · 0.81mm/px · 5 of 17 slices shown]
[im 1/17]
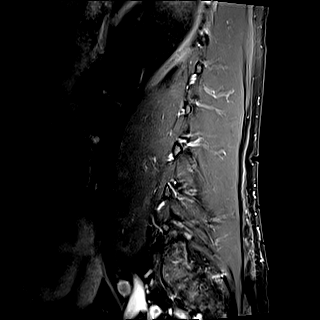
[im 5/17]
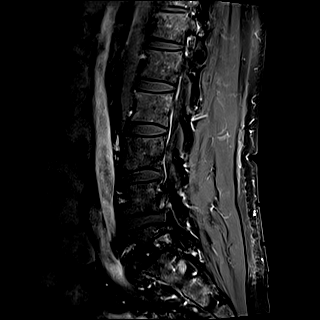
[im 9/17]
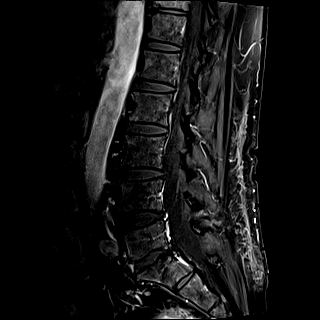
[im 13/17]
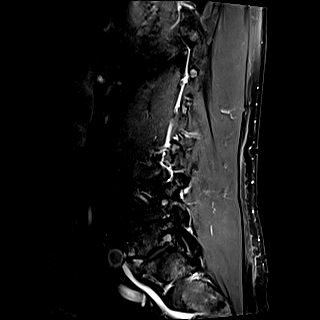
[im 17/17]
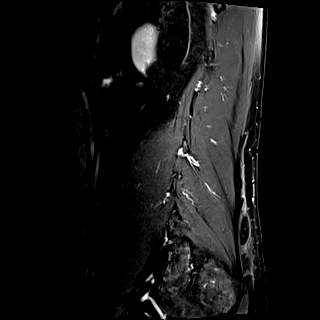

[34 of 48 positions shown; findings below may reference images not displayed]

FINDINGS: Segmentation:  Standard.

Alignment:  No significant listhesis.

Vertebrae: Vertebral body heights are maintained. No substantial
marrow edema. No suspicious osseous lesion.

Conus medullaris and cauda equina: Conus extends to the T12-L1
level. Conus and cauda equina appear normal. No abnormal intrathecal
enhancement.

Paraspinal and other soft tissues: Probable bilateral renal cysts
partially imaged.

Disc levels:

L1-L2:  No canal or foraminal stenosis.

L2-L3:  No canal or foraminal stenosis.

L3-L4:  Disc bulge.  No canal or foraminal stenosis.

L4-L5: Disc bulge. Central and left subarticular annular fissure. No
canal or foraminal stenosis.

L5-S1: Disc bulge with endplate osteophytic ridging. Facet
arthropathy. No canal stenosis. Moderate right and mild left
foraminal stenosis. Potential exiting right L5 nerve root
compression.
IMPRESSION: No evidence of myelomatous involvement. No compression fracture.
Mild lower lumbar degenerative changes. There is right foraminal
narrowing at L5-S1 with potential exiting nerve root compression.

## 2021-06-19 MED ORDER — POMALIDOMIDE 2 MG PO CAPS: 2.0000 mg | ORAL_CAPSULE | Freq: Every day | ORAL | 0 refills | Status: DC

## 2021-06-19 MED ORDER — GADOBUTROL 1 MMOL/ML IV SOLN
9.0000 mL | Freq: Once | INTRAVENOUS | Status: AC | PRN
  Administered 2021-06-19: 9 mL via INTRAVENOUS

## 2021-06-19 MED ORDER — CHLORHEXIDINE GLUCONATE 0.12 % MT SOLN
15.0000 mL | Freq: Two times a day (BID) | OROMUCOSAL | 1 refills | Status: DC
Start: 2021-06-19 — End: 2022-07-25

## 2021-06-19 NOTE — Progress Notes (Signed)
Pt in for follow up, denies any concerns.  States has not been on plavix medication "in awhile".

## 2021-06-19 NOTE — Progress Notes (Signed)
Hematology/Oncology  Follow up note [facility] [phone] [phone]) [phone]   Patient Care Team: Tonia Ghent, MD as PCP - General (Family Medicine) Earlie Server, MD as Consulting Physician (Oncology) Beather Arbour Lilyan Punt, MD as Referring Physician (Hematology and Oncology) Diannia Ruder, MD as Referring Physician (Hematology and Oncology)  REASON FOR VISIT Follow up for multiple myeloma.   HISTORY OF PRESENTING ILLNESS:  Luis Burke is a  65 y.o.  male with PMH listed below presents for follow up of multiple myeloma.  # 07/27/2018 multiple myeloma panel showed M protein of 0.1, IgG 662, IgA 49, IgM 7. Patient was called back to further labs done. 08/10/2018, free light chain ratio showed extremely high level of kappa free light chain 10,183, with a kappa lambda light chain ratio of 1414.31 LDH 164 Beta-2 microglobulin 5 Patient was called and discuss about results.  He was recommended to undergo bone marrow biopsy and PET scan. 08/19/2018 bone marrow biopsy showed hypercellular marrow 80%, involved by plasma cell neoplasm up to 95%.  Consistent with plasma cell myeloma. Myeloma FISH  gain of gain of CEP12  09/10/2018 skeletal survey showed questionable small lucent lesions within the midshaft of the humerus bilaterally.  Otherwise no suspicious focal lytic lesion or acute bone abnormality. 08/25/2018 PET scan showed no definite hypermetabolic bone disease but CT findings are highly suspicious for numerous small myelomatous lesions involving the spine, sternum and scattered ribs.  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 He received preparative regimen with melphalan 200 mg/m on 04/22/2019 followed by autologous stem cell infusion on 04/23/2019. Currently he is transfusion independent.  Transfusion criteria with as needed hemoglobin less than 7.5 for platelet less than 10,000.  Transplant course was complicated with  febrile neutropenia grade 3, treated with vancomycin and cephapirin until engraftment on 05/06/2019. Patient also had engraftment syndrome and had to be started on Solu-Medrol 25 mg twice daily on 05/05/2019.  Transition to Medrol Dosepak on 05/07/2019 to complete steroid taper as outpatient.  Patient is currently on acyclovir for 1 year after transplant, dose was reduced on 722 11/20/2018 due to renal function. Patient had a baseline CKD with creatinine running between 1.4-1.6.  He had acute on chronic kidney injury and creatinine went up to 2.2 on 722.  Fluid hydration was given and creatinine decreased to baseline 1.5-1.7. His Hickman catheter was removed.  Candida Cruris treated with topical clotrimazole 1% 3 times daily and to resolution..  #  seen by Dr. Tonia Brooms BMT team on 06/30/2019.revaccination at [facility] begins at 12 months post transplant I discussed with [facility] hematology Dr.Choi and he recommends plans as listed below.  -patient to be started on Ixazomib maintenance: -Ixazomib 77m on D1/D8/D15 out of 28 day cycle.  -patient will start re-vaccination 12 months post transplant.  -Post transplant bone marrow biopsy if clinically indicated.  # seen by [facility] bone marrow transplant team in June 2021.  Patient has been started on immunization protocol.  He reports no new complaints.  #[facility]12[facility]  Lucent lesion in the L4 spinous process measuring 4 mm which is  nonspecific. Consider confirmation of marrow replacing process with MRI.  2.  Incompletely characterized renal cysts   # He continues to have chronic back pain and neck pain.  [facility] MRI lumbar spine with and without contrast showed no evidence of spinal metastasis.  Lumbar spondylosis is most pronounced  at L5-S1 where there is severe right and moderate left neural foraminal stenosis. 08/11/2020, x-ray cervical spine complete with flexion and extension showed The cervical spine  is visualized from C1 to the top of T1 on the lateral  view.  Reversal of the normal cervical lordosis with a focal kyphosis centered at the C5-C6 intervertebral level. Trace anterolisthesis of C4 onto C5. Trace retrolisthesis of C6 on C7. No evidence of dynamic instability is noted on  the flexion or extension views.   No prevertebral soft tissue swelling. Cervical vertebral body heights are maintained. Multilevel degenerative changes of the cervical spine including discogenic disease, most pronounced at C5-C6 and C6-C7, and facet arthropathy most pronounced at C4-C5. Multilevel mild to moderate left neural foraminal osseous narrowing of the mid to lower cervical vertebrae, possibly centrally/artifactual by patient positioning.  #07/28/2020 bone marrow normocellular marrow with polytypic plasmacytosis, 3 to 8%   Patient is in remission.  Cytogenetics negative for trisomy 12.  Negative myeloma FISH panel.   # Lumbar lesion,  11/13/20 [facility]  MRI cervical sine w/wo contrast - no evidence of metastatic disease C3-4 severe spinal stenosis, left neural foraminal narrowing. C4-5 spinal canal stenosis. [facility] MRI results showed no metastatic spine lesion. Chronic back pain and neck pain, images are consistent with chronic degenerative disease.  Patient continues to follow-up with [place] orthopedic surgeon.  04/03/2021, light chain ratio increased to 3.76.   05/15/2021 bone marrow biopsy showed slightly hypercellular bone marrow for age with slight plasmacytosis.  5% plasma cells with kappa light chain restriction.  Cytogenetics normal.  Myeloma FISH negative # 05/15/2021 bone marrow biopsy showed slightly hypercellular bone marrow for age with slight plasmacytosis.  5% plasma cells with kappa light chain restriction.  Cytogenetics normal.  Myeloma FISH negative  # 05/31/2021 Daratumumab -Pomalyst-dexamethasone-D1 patient had grade 2 infusion reaction to daratumumab and received steroids, Benadryl, famotidine.  He was  able to finish treatment.D8 was able to finish Daratumumab with no infusion reactions.  INTERVAL HISTORY Luis Burke is a 65 y.o. male who has above history reviewed by me today for follow-up  Currently on Daratumumab Pomalyst dexamethasone Patient tolerates well. Denies any allergic reaction to daratumumab. Nausea vomiting diarrhea.  Patient has had MRI spine done for back pain evaluation.   Review of Systems  Constitutional:  Negative for appetite change, chills, fatigue, fever and unexpected weight change.  HENT:   Negative for hearing loss and voice change.   Eyes:  Negative for eye problems and icterus.  Respiratory:  Negative for chest tightness, cough and shortness of breath.   Cardiovascular:  Negative for chest pain and leg swelling.  Gastrointestinal:  Negative for abdominal distention and abdominal pain.  Endocrine: Negative for hot flashes.  Genitourinary:  Negative for difficulty urinating, dysuria and frequency.   Musculoskeletal:  Positive for back pain. Negative for arthralgias and neck pain.  Skin:  Negative for itching and rash.  Neurological:  Negative for light-headedness and numbness.  Hematological:  Negative for adenopathy. Does not bruise/bleed easily.  Psychiatric/Behavioral:  Negative for confusion.    MEDICAL HISTORY:  Past Medical History:  Diagnosis Date   AKI (acute kidney injury) ([place]) 01/06/2019   Anemia    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[place])    autologous stem cell bone marrow transplant on 04/23/2019.   Fatty liver    on u/s 07/2018   Hyperlipidemia 03/2004   Hypertension 1994   Multiple myeloma ([place])    Snores     SURGICAL HISTORY: Past Surgical  History:  Procedure Laterality Date   CATARACT EXTRACTION W/PHACO Right 03/04/2018   Procedure: CATARACT EXTRACTION PHACO AND INTRAOCULAR LENS PLACEMENT ([place])  RIGHT TORIC;  Surgeon: Leandrew Koyanagi, MD;  Location: [facility];  Service: Ophthalmology;  Laterality: Right;  [place]  no Toric Lens 1:45 5.3   COLONOSCOPY WITH PROPOFOL N/A 07/20/2018   Procedure: COLONOSCOPY WITH PROPOFOL;  Surgeon: Jonathon Bellows, MD;  Location: [facility] ENDOSCOPY;  Service: Gastroenterology;  Laterality: N/A;   ESOPHAGOGASTRODUODENOSCOPY (EGD) WITH PROPOFOL N/A 07/20/2018   Procedure: ESOPHAGOGASTRODUODENOSCOPY (EGD) WITH PROPOFOL;  Surgeon: Jonathon Bellows, MD;  Location: [facility] ENDOSCOPY;  Service: Gastroenterology;  Laterality: N/A;   EYE SURGERY Right    HERNIA REPAIR     L Lap herniorraphy  04/2000   Left wrist ganglionectomy      SOCIAL HISTORY: Social History   Socioeconomic History   Marital status: Single    Spouse name: Not on file   Number of children: 1   Years of education: Not on file   Highest education level: Not on file  Occupational History   Occupation: [employer]  Tobacco Use   Smoking status: Never   Smokeless tobacco: Never   Tobacco comments:    Occassionally  Vaping Use   Vaping Use: Never used  Substance and Sexual Activity   Alcohol use: Not Currently    Alcohol/week: 3.0 standard drinks    Types: 3 Cans of beer per week   Drug use: No   Sexual activity: Not on file  Other Topics Concern   Not on file  Social History Narrative   Divorced 2008/01/17, dating as of 2018/01/16   His daughter died 4 days after giving birth to the patient's granddaughter   Has joint custody of his dead daughter's child   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 Strain: Not on [employer] Insecurity: Not on file  Transportation Needs: Not on file  Physical Activity: Not on file  Stress: Not on file  Social Connections: Not on file  Intimate Partner Violence: Not on file    FAMILY HISTORY: Family History  Problem Relation Age of Onset   Hypertension Mother    Diabetes Mother    Heart disease Mother        CAD   Dementia Mother    Hypertension Father    Heart disease Father        MI 02/03   Colon cancer Father     Hypertension Sister    Hypertension Sister    Hypertension Sister    Prostate cancer Neg Hx     ALLERGIES:  is allergic to bactrim [sulfamethoxazole-trimethoprim], clonidine derivatives, and tadalafil.  MEDICATIONS:  Current Outpatient Medications  Medication Sig Dispense Refill   acetaminophen (TYLENOL) 325 MG tablet Take 2 tablets (650 mg total) by mouth See admin instructions. 1 hour prior to chemotherapy treatments 30 tablet 0   amLODipine (NORVASC) 5 MG tablet TAKE 2 TABLETS BY MOUTH EVERY DAY 180 tablet 1   ASPIRIN 81 PO Take 81 mg by mouth daily.     B Complex Vitamins (VITAMIN B COMPLEX PO) SMARTSIG:By Mouth     chlorhexidine (PERIDEX) 0.12 % solution Use as directed 15 mLs in the mouth or throat 2 (two) times daily. 473 mL 1   co-enzyme Q-10 50 MG capsule Take 50 mg by mouth daily.     [employer] VITAMIN B12 1000 MCG tablet TAKE 1 TABLET BY MOUTH EVERY DAY  90 tablet 3   dexamethasone (DECADRON) 4 MG tablet Take 5 tablets (20 mg total) by mouth once a week. Take at least 1 hour prior to infusion appt. Take with food. To start on 06/07/21. 20 tablet 1   diphenhydrAMINE (BENADRYL) 50 MG tablet Take 1 tablet (50 mg total) by mouth See admin instructions. Take 1 tablet 1 hour prior to chemotherapy treatments. 30 tablet 0   hydrALAZINE (APRESOLINE) 10 MG tablet TAKE 1 TABLET (10 MG TOTAL) BY MOUTH 3 (THREE) TIMES DAILY. 270 tablet 1   lisinopril (ZESTRIL) 20 MG tablet TAKE 1 TABLET BY MOUTH EVERY DAY. PLEASE MAKE APPOINTMENT FOR FURTHER REFILLS. 90 tablet 0   LORazepam (ATIVAN) 1 MG tablet TAKE 1 TABLET (1 MG TOTAL) BY MOUTH EVERY 6 (SIX) HOURS AS NEEDED FOR ANXIETY (FOR NAUSEA)     magnesium chloride (SLOW-MAG) 64 MG TBEC SR tablet Take 1 tablet (64 mg total) by mouth daily. 60 tablet 0   metoprolol succinate (TOPROL-XL) 50 MG 24 hr tablet TAKE 4 TABLETS (200 MG TOTAL) BY MOUTH DAILY. TAKE WITH OR IMMEDIATELY FOLLOWING A MEAL. 360 tablet 2   montelukast (SINGULAIR) 10 MG tablet Take 1 tablet (10  mg total) by mouth See admin instructions. Take 1 hour prior to chemotherapy 30 tablet 0   ondansetron (ZOFRAN) 8 MG tablet Take 1 tablet (8 mg total) by mouth every 8 (eight) hours as needed. for nausea 30 tablet 1   pravastatin (PRAVACHOL) 10 MG tablet TAKE 1 TABLET BY MOUTH EVERY DAY 90 tablet 3   zinc gluconate 50 MG tablet Take 50 mg by mouth daily.     acyclovir (ZOVIRAX) 400 MG tablet Take 400 mg by mouth 2 (two) times daily. (Patient not taking: No sig reported)     clopidogrel (PLAVIX) 75 MG tablet Take by mouth. (Patient not taking: No sig reported)     cyclobenzaprine (FLEXERIL) 5 MG tablet Take 1 tablet (5 mg total) by mouth 3 (three) times daily as needed for muscle spasms. (Patient not taking: No sig reported) 30 tablet 0   loperamide (IMODIUM) 2 MG capsule Take 1 capsule (2 mg total) by mouth See admin instructions. Take 2 capsules at the onset of diarrhea, then 1 capsule every 2 hours or after every loose bowel movement. Maximum 8 capsules per 24 hours. (Patient not taking: Reported on 06/19/2021) 60 capsule 0   pomalidomide (POMALYST) 2 MG capsule Take 1 capsule (2 mg total) by mouth daily. Take for 21 days on, then hold for 7 days. Repeat every 28 days. 21 capsule 0   prochlorperazine (COMPAZINE) 10 MG tablet TAKE 1 TABLET (10 MG TOTAL) BY MOUTH EVERY 6 (SIX) HOURS AS NEEDED FOR NAUSEA (Patient not taking: No sig reported)     No current facility-administered medications for this visit.     PHYSICAL EXAMINATION: ECOG PERFORMANCE STATUS: 1 - Symptomatic but completely ambulatory Vitals:   06/19/21 1517  BP: (!) 143/87  Pulse: 67  Resp: 16  Temp: 98.3 F (36.8 C)  SpO2: 98%   Filed Weights   06/19/21 1517  Weight: 201 lb (91.2 kg)    Physical Exam Constitutional:      General: He is not in acute distress. HENT:     Head: Normocephalic and atraumatic.  Eyes:     General: No scleral icterus. Cardiovascular:     Rate and Rhythm: Normal rate and regular rhythm.      Heart sounds: Normal heart sounds.  Pulmonary:     Effort:  Pulmonary effort is normal. No respiratory distress.     Breath sounds: No wheezing.  Abdominal:     General: Bowel sounds are normal. There is no distension.     Palpations: Abdomen is soft.  Musculoskeletal:        General: No deformity. Normal range of motion.     Cervical back: Normal range of motion and neck supple.  Skin:    General: Skin is warm and dry.     Findings: No erythema or rash.  Neurological:     Mental Status: He is alert and oriented to person, place, and time. Mental status is at baseline.     Cranial Nerves: No cranial nerve deficit.     Coordination: Coordination normal.  Psychiatric:        Mood and Affect: Mood normal.     LABORATORY DATA:  I have reviewed the data as listed Lab Results  Component Value Date   WBC 2.3 (L) 06/19/2021   HGB 12.4 (L) 06/19/2021   HCT 36.0 (L) 06/19/2021   MCV 100.6 (H) 06/19/2021   PLT 64 (L) 06/19/2021   Recent Labs    06/22/20 0847 07/20/20 0844 06/05/21 1437 06/12/21 1249 06/19/21 1449  NA 139   < > 136 135 135  K 4.5   < > 3.7 4.6 4.1  CL 103   < > 100 99 101  CO2 26   < > $R'27 27 25  'rd$ GLUCOSE 104*   < > 130* 105* 94  BUN 24*   < > 23 26* 22  CREATININE 1.55*   < > 1.36* 1.75* 1.12  CALCIUM 9.8   < > 9.9 9.8 9.3  GFRNONAA 47*   < > 58* 43* >60  GFRAA 54*  --   --   --   --   PROT 7.6   < > 7.4 7.3 7.1  ALBUMIN 4.3   < > 4.4 4.5 4.3  AST 24   < > $R'22 16 15  'YA$ ALT 24   < > 49* 30 40  ALKPHOS 59   < > 77 59 64  BILITOT 0.8   < > 0.6 0.8 0.5   < > = values in this interval not displayed.    Iron/TIBC/Ferritin/ %Sat    Component Value Date/Time   IRON 74 07/01/2018 0944   TIBC 250 07/01/2018 0944   FERRITIN 357 07/01/2018 0944   IRONPCTSAT 30 07/01/2018 0944    Lab Results  Component Value Date   TOTALPROTELP 7.0 05/01/2021   ALBUMINELP 4.0 03/08/2021   A1GS 0.2 03/08/2021   A2GS 0.7 03/08/2021   BETS 1.1 03/08/2021   GAMS 1.1 03/08/2021    MSPIKE Not Observed 03/08/2021   SPEI Comment 03/08/2021   Lab Results  Component Value Date   KPAFRELGTCHN 60.4 (H) 05/01/2021   LAMBDASER 15.2 05/01/2021   KAPLAMBRATIO 3.97 (H) 05/01/2021    RADIOGRAPHIC STUDIES: I have personally reviewed the radiological images as listed and agreed with the findings in the report. MR Lumbar Spine W Wo Contrast  Result Date: 06/19/2021 CLINICAL DATA:  Back pain, multiple myeloma EXAM: MRI LUMBAR SPINE WITHOUT AND WITH CONTRAST TECHNIQUE: Multiplanar and multiecho pulse sequences of the lumbar spine were obtained without and with intravenous contrast. CONTRAST:  59mL GADAVIST GADOBUTROL 1 MMOL/ML IV SOLN COMPARISON:  None. FINDINGS: Segmentation:  Standard. Alignment:  No significant listhesis. Vertebrae: Vertebral body heights are maintained. No substantial marrow edema. No suspicious osseous lesion. Conus medullaris and cauda equina: Conus  extends to the T12-L1 level. Conus and cauda equina appear normal. No abnormal intrathecal enhancement. Paraspinal and other soft tissues: Probable bilateral renal cysts partially imaged. Disc levels: L1-L2:  No canal or foraminal stenosis. L2-L3:  No canal or foraminal stenosis. L3-L4:  Disc bulge.  No canal or foraminal stenosis. L4-L5: Disc bulge. Central and left subarticular annular fissure. No canal or foraminal stenosis. L5-S1: Disc bulge with endplate osteophytic ridging. Facet arthropathy. No canal stenosis. Moderate right and mild left foraminal stenosis. Potential exiting right L5 nerve root compression. IMPRESSION: No evidence of myelomatous involvement. No compression fracture. Mild lower lumbar degenerative changes. There is right foraminal narrowing at L5-S1 with potential exiting nerve root compression. Electronically Signed   By: Macy Mis M.D.   On: 06/19/2021 12:20   CT BONE MARROW BIOPSY & ASPIRATION  Result Date: 05/15/2021 CLINICAL DATA:  History of multiple myeloma with possible relapse due to  increased light chains. Need for bone marrow biopsy for further evaluation. EXAM: CT GUIDED BONE MARROW ASPIRATION AND BIOPSY ANESTHESIA/SEDATION: Versed 2.0 mg IV, Fentanyl 100 mcg IV Total Moderate Sedation Time:   13 minutes. The patient's level of consciousness and physiologic status were continuously monitored during the procedure by Radiology nursing. PROCEDURE: The procedure risks, benefits, and alternatives were explained to the patient. Questions regarding the procedure were encouraged and answered. The patient understands and consents to the procedure. A time out was performed prior to initiating the procedure. The right gluteal region was prepped with chlorhexidine. Sterile gown and sterile gloves were used for the procedure. Local anesthesia was provided with 1% Lidocaine. Under CT guidance, an 11 gauge On Control bone cutting needle was advanced from a posterior approach into the right iliac bone. Needle positioning was confirmed with CT. Initial non heparinized and heparinized aspirate samples were obtained of bone marrow. Core biopsy was performed via the On Control drill needle. Two separate core biopsy samples were obtained. COMPLICATIONS: None FINDINGS: Inspection of initial aspirate did reveal visible particles. Intact core biopsy samples were obtained. IMPRESSION: CT guided bone marrow biopsy of right posterior iliac bone with both aspirate and core samples obtained. Electronically Signed   By: Aletta Edouard M.D.   On: 05/15/2021 10:51      ASSESSMENT & PLAN:  1. Multiple myeloma not having achieved remission ([place])   2. Encounter for antineoplastic chemotherapy   3. Thrombocytopenia ([facility])   4. Stage 3a chronic kidney disease ([place])   5. Chemotherapy induced neutropenia ([facility])    #Light chain multiple myeloma beta 2 microglobulin 5 and normal albumin.  Stage II.cytogenetics showed normal male chromosome, MDS FISH panel showed trisomy 25- Standard Risk.S/p RVD x 9 and  Autologous bone  marrow stem [facility] at [facility] on 04/23/2019.' Early relapse multiple myeloma. Labs reviewed and discussed with patient. 06/21/2021 to proceed with cycle 1 day 22 Daratumumab subcutaneous/dexamethasone 20 mg weekly. Hold Pomalyst due to neutropenia.  # Neutropenia, grade 3 Likely due to chemo. Discussed about neutropenia precaution.  Hold pomalyst, Steele Sizer has one more dose for current cycle.] Proceed with daratumumab given that [place] is still above 500 Monitor cbc weekly.   # Thrombocytopenia, monitor closely.  Platelet count 64,000. # CKD, creatinine has increased to 1.12.  Recommend patient to increase oral hydration.  Close monitor.  Avoid nephrotoxins.   #Back pain, MRI lumbar spine showed no suspicious myeloma lesions Xgeva monthly.   Post bone marrow transplant care He has completed 1 year of acyclovir.  Off acyclovir now. Post transplant vaccination  [facility] at [facility].   #Bone health/osteopenia/multiple myeloma-  Continue Xgeva monthly.- next 06/30/21 Continue calcium and vitamin D supplementation. Follow up 1 week for evaluation prior to starting next Daratumumab treatment.  Earlie Server, MD, PhD Hematology Oncology [place] at [facility] [place] 06/19/2021

## 2021-06-20 ENCOUNTER — Telehealth: Payer: Self-pay | Admitting: *Deleted

## 2021-06-20 ENCOUNTER — Encounter: Payer: Self-pay | Admitting: Oncology

## 2021-06-20 ENCOUNTER — Other Ambulatory Visit: Payer: Self-pay

## 2021-06-20 DIAGNOSIS — C9000 Multiple myeloma not having achieved remission: Secondary | ICD-10-CM

## 2021-06-20 MED ORDER — POMALIDOMIDE 2 MG PO CAPS
2.0000 mg | ORAL_CAPSULE | Freq: Every day | ORAL | 0 refills | Status: DC
Start: 2021-06-20 — End: 2021-07-17

## 2021-06-20 NOTE — Addendum Note (Signed)
Addended by: Earlie Server on: 06/20/2021 02:50 PM   Modules accepted: Orders

## 2021-06-20 NOTE — Telephone Encounter (Signed)
New script with updated [employer] auth # has been sent to [facility].

## 2021-06-20 NOTE — Telephone Encounter (Signed)
Pharmacy called stating that the [employer] auth number on recently submitted prescription is old and a new number is needed, you can resend prescription with new auth # or call them with the auth #

## 2021-06-21 ENCOUNTER — Inpatient Hospital Stay: Payer: Medicare Other

## 2021-06-21 ENCOUNTER — Other Ambulatory Visit: Payer: Self-pay | Admitting: Family Medicine

## 2021-06-21 ENCOUNTER — Other Ambulatory Visit: Payer: Self-pay

## 2021-06-21 VITALS — BP 146/98 | HR 71 | Temp 97.0°F | Resp 18

## 2021-06-21 DIAGNOSIS — Z5112 Encounter for antineoplastic immunotherapy: Secondary | ICD-10-CM | POA: Diagnosis not present

## 2021-06-21 DIAGNOSIS — C9000 Multiple myeloma not having achieved remission: Secondary | ICD-10-CM

## 2021-06-21 MED ORDER — DARATUMUMAB-HYALURONIDASE-FIHJ 1800-30000 MG-UT/15ML ~~LOC~~ SOLN
1800.0000 mg | Freq: Once | SUBCUTANEOUS | Status: AC
  Administered 2021-06-21: 1800 mg via SUBCUTANEOUS
  Filled 2021-06-21: qty 15

## 2021-06-21 MED ORDER — DIPHENHYDRAMINE HCL 50 MG/ML IJ SOLN: 50.0000 mg | Freq: Once | INTRAMUSCULAR | Status: DC

## 2021-06-21 MED ORDER — MONTELUKAST SODIUM 10 MG PO TABS
10.0000 mg | ORAL_TABLET | Freq: Once | ORAL | Status: AC
  Administered 2021-06-21: 10 mg via ORAL
  Filled 2021-06-21: qty 1

## 2021-06-21 MED ORDER — DIPHENHYDRAMINE HCL 25 MG PO CAPS
50.0000 mg | ORAL_CAPSULE | Freq: Once | ORAL | Status: AC
Start: 2021-06-21 — End: 2021-06-21
  Administered 2021-06-21: 50 mg via ORAL
  Filled 2021-06-21: qty 2

## 2021-06-21 MED ORDER — ACETAMINOPHEN 325 MG PO TABS
650.0000 mg | ORAL_TABLET | Freq: Once | ORAL | Status: AC
  Administered 2021-06-21: 650 mg via ORAL
  Filled 2021-06-21: qty 2

## 2021-06-21 MED ORDER — DEXAMETHASONE 4 MG PO TABS
20.0000 mg | ORAL_TABLET | Freq: Once | ORAL | Status: AC
  Administered 2021-06-21: 20 mg via ORAL
  Filled 2021-06-21: qty 5

## 2021-06-21 NOTE — Patient Instructions (Addendum)
[facility]   Discharge Instructions: Thank you for choosing [facility] to provide your oncology and hematology care.  If you have a lab appointment with the [facility], please go directly to the [facility] and check in at the registration area.  Wear comfortable clothing and clothing appropriate for easy access to any Portacath or PICC line.   We strive to give you quality time with your provider. You may need to reschedule your appointment if you arrive late (15 or more minutes).  Arriving late affects you and other patients whose appointments are after yours.  Also, if you miss three or more appointments without notifying the office, you may be dismissed from the clinic at the provider's discretion.      For prescription refill requests, have your pharmacy contact our office and allow 72 hours for refills to be completed.    Today you received the following chemotherapy and/or immunotherapy agents - Darzalex      To help prevent nausea and vomiting after your treatment, we encourage you to take your nausea medication as directed.  BELOW ARE SYMPTOMS THAT SHOULD BE REPORTED IMMEDIATELY: *FEVER GREATER THAN 100.4 F (38 C) OR HIGHER *CHILLS OR SWEATING *NAUSEA AND VOMITING THAT IS NOT CONTROLLED WITH YOUR NAUSEA MEDICATION *UNUSUAL SHORTNESS OF BREATH *UNUSUAL BRUISING OR BLEEDING *URINARY PROBLEMS (pain or burning when urinating, or frequent urination) *BOWEL PROBLEMS (unusual diarrhea, constipation, pain near the anus) TENDERNESS IN MOUTH AND THROAT WITH OR WITHOUT PRESENCE OF ULCERS (sore throat, sores in mouth, or a toothache) UNUSUAL RASH, SWELLING OR PAIN  UNUSUAL VAGINAL DISCHARGE OR ITCHING   Items with * indicate a potential emergency and should be followed up as soon as possible or go to the Emergency Department if any problems should occur.  Please show the CHEMOTHERAPY ALERT CARD or IMMUNOTHERAPY ALERT CARD at  check-in to the Emergency Department and triage nurse.  Should you have questions after your visit or need to cancel or reschedule your appointment, please contact [facility]  [phone] and follow the prompts.  Office hours are 8:00 a.m. to 4:30 p.m. Monday - Friday. Please note that voicemails left after 4:00 p.m. may not be returned until the following business day.  We are closed weekends and major holidays. You have access to a nurse at all times for urgent questions. Please call the main number to the clinic [phone] and follow the prompts.  For any non-urgent questions, you may also contact your provider using MyChart. We now offer e-Visits for anyone 59 and older to request care online for non-urgent symptoms. For details visit [email or web address].   Also download the MyChart app! Go to the app store, search "MyChart", open the app, select [facility], and log in with your MyChart username and password.  Due to Covid, a mask is required upon entering the hospital/clinic. If you do not have a mask, one will be given to you upon arrival. For doctor visits, patients may have 1 support person aged 32 or older with them. For treatment visits, patients cannot have anyone with them due to current Covid guidelines and our immunocompromised population.   Diphenhydramine Injection What is this medication? DIPHENHYDRAMINE (dye fen HYE dra meen) treats the symptoms of allergic reactions. It may also be used to prevent and treat motion sickness or the symptoms of Parkinson disease. It works by blocking histamine, a substance released by the body during an allergic reaction. It belongs to  a group of medications called antihistamines. This medicine may be used for other purposes; ask your health care provider or pharmacist if you have questions. COMMON BRAND NAME(S): Benadryl What should I tell my care team before I take this medication? They need to know if  you have any of these conditions: Asthma or lung disease Glaucoma High blood pressure or heart disease Liver disease Pain or difficulty passing urine Prostate trouble Ulcers or other stomach problems An unusual or allergic reaction to diphenhydramine, antihistamines, other medications foods, dyes, or preservatives Pregnant or trying to get pregnant Breast-feeding How should I use this medication? This medication is for injection into a vein or a muscle. It is usually given in a hospital or clinic. If you get this medication at home, you will be taught how to prepare and give this medication. Use exactly as directed. Take your medication at regular intervals. Do not take your medication more often than directed. It is important that you put your used needles and syringes in a special sharps container. Do not put them in a trash can. If you do not have a sharps container, call your pharmacist or care team to get one. Talk to your care team regarding the use of this medication in children. While this medication may be prescribed for selected conditions, precautions do apply. This medication is not approved for use in newborns and premature babies. Patients over 20 years old may have a stronger reaction and need a smaller dose. Overdosage: If you think you have taken too much of this medicine contact a poison control center or emergency room at once. NOTE: This medicine is only for you. Do not share this medicine with others. What if I miss a dose? If you miss a dose, take it as soon as you can. If it is almost time for your next dose, take only that dose. Do not take double or extra doses. What may interact with this medication? Do not take this medication with any of the following: MAOIs like Carbex, Eldepryl, Marplan, Nardil, and Parnate This medication may also interact with the following: Alcohol Barbiturates, like phenobarbital Medications for bladder spasm like oxybutynin,  tolterodine Medications for blood pressure Medications for depression, anxiety, or psychotic disturbances Medications for movement abnormalities or Parkinson disease Medications for sleep Other medications for cold, cough or allergy Some medications for the stomach like chlordiazepoxide, dicyclomine This list may not describe all possible interactions. Give your health care provider a list of all the medicines, herbs, non-prescription drugs, or dietary supplements you use. Also tell them if you smoke, drink alcohol, or use illegal drugs. Some items may interact with your medicine. What should I watch for while using this medication? Your condition will be monitored carefully while you are receiving this medication. Tell your care team if your symptoms do not start to get better or if they get worse. You may get drowsy or dizzy. Do not drive, use machinery, or do anything that needs mental alertness until you know how this medication affects you. Do not stand or sit up quickly, especially if you are an older patient. This reduces the risk of dizzy or fainting spells. Alcohol may interfere with the effect of this medication. Avoid alcoholic drinks. Your mouth may get dry. Chewing sugarless gum or sucking hard candy, and drinking plenty of water may help. Contact your care team if the problem does not go away or is severe. What side effects may I notice from receiving this medication? Side effects  that you should report to your care team as soon as possible: Allergic reactions-skin rash, itching, hives, swelling of the face, lips, tongue, or throat Sudden eye pain or change in vision such as blurry vision, seeing halos around lights, vision loss Trouble passing urine Side effects that usually do not require medical attention (report to your care team if they continue or are bothersome): Constipation Drowsiness Dry mouth Headache Upset stomach This list may not describe all possible side effects.  Call your doctor for medical advice about side effects. You may report side effects to FDA at 1-800-FDA-1088. Where should I keep my medication? Keep out of the reach of children and pets. If you are using this medication at home, you will be instructed on how to store this medication. Throw away any unused medication after the expiration date on the label. NOTE: This sheet is a summary. It may not cover all possible information. If you have questions about this medicine, talk to your doctor, pharmacist, or health care provider.  2022 Elsevier/Gold Standard (2020-10-27 11:52:56)  Acetaminophen Capsules or Tablets What is this medication? ACETAMINOPHEN (a set a MEE noe fen) treats mild to moderate pain. It may also be used to reduce fever. This medicine may be used for other purposes; ask your health care provider or pharmacist if you have questions. COMMON BRAND NAME(S): Aceta, Actamin, Anacin Aspirin Free, Genapap, Genebs, Mapap, Pain & Fever, Pain and Fever, PAIN RELIEF, PAIN RELIEF Extra Strength, Pain Reliever, Panadol, PHARBETOL, Plus PHARMA, Q-Pap, Q-Pap Extra Strength, Tylenol, Tylenol CrushableTablet, Tylenol Extra Strength, Tylenol Regular Strength, XS No Aspirin, XS Pain Reliever What should I tell my care team before I take this medication? They need to know if you have any of these conditions: If you often drink alcohol Liver disease An unusual or allergic reaction to acetaminophen, other medications, foods, dyes, or preservatives Pregnant or trying to get pregnant Breast-feeding How should I use this medication? Take this medication by mouth with a glass of water. Follow the directions on the package or prescription label. Take your medication at regular intervals. Do not take your medication more often than directed. Talk to your care team about the use of this medication in children. While this medication may be prescribed for children as young as 69 years of age for selected  conditions, precautions do apply. Overdosage: If you think you have taken too much of this medicine contact a poison control center or emergency room at once. NOTE: This medicine is only for you. Do not share this medicine with others. What if I miss a dose? If you miss a dose, take it as soon as you can. If it is almost time for your next dose, take only that dose. Do not take double or extra doses. What may interact with this medication? Alcohol Imatinib Isoniazid Other medications with acetaminophen This list may not describe all possible interactions. Give your health care provider a list of all the medicines, herbs, non-prescription drugs, or dietary supplements you use. Also tell them if you smoke, drink alcohol, or use illegal drugs. Some items may interact with your medicine. What should I watch for while using this medication? Tell your care team if the pain lasts more than 10 days (5 days for children), if it gets worse, or if there is a new or different kind of pain. Also, check with your care team if a fever lasts for more than 3 days. Do not take other medications that contain acetaminophen with this one.  Always read labels carefully. If you have questions, ask your care team. If you take too much acetaminophen, get medical help right away. Too much acetaminophen can be very dangerous and cause liver damage. Even if you do not have symptoms, it is important to get help right away. What side effects may I notice from receiving this medication? Side effects that you should report to your care team as soon as possible: Allergic reactions-skin rash, itching, hives, swelling of the face, lips, tongue, or throat Liver injury-right upper belly pain, loss of appetite, nausea, light-colored stool, dark yellow or brown urine, yellowing skin or eyes, unusual weakness or fatigue Redness, blistering, peeling, or loosening of the skin, including inside the mouth Side effects that usually do not  require medical attention (report to your care team if they continue or are bothersome): Headache Nausea Trouble sleeping Upset stomach This list may not describe all possible side effects. Call your doctor for medical advice about side effects. You may report side effects to FDA at 1-800-FDA-1088. Where should I keep my medication? Keep out of reach of children and pets. Store at room temperature between 20 and 25 degrees C (68 and 77 degrees F). Protect from moisture and heat. Throw away any unused medication after the expiration date. NOTE: This sheet is a summary. It may not cover all possible information. If you have questions about this medicine, talk to your doctor, pharmacist, or health care provider.  2022 Elsevier/Gold Standard (2020-08-15 11:07:55)  Montelukast Chewable Tablets What is this medication? MONTELUKAST (mon te LOO kast) prevents and treats the symptoms of asthma and allergies. It works by decreasing inflammation in the airways, making it easier to breathe. Do not use this medication to treat a sudden asthma attack. This medicine may be used for other purposes; ask your health care provider or pharmacist if you have questions. COMMON BRAND NAME(S): Singulair What should I tell my care team before I take this medication? They need to know if you have any of these conditions: Liver disease Phenylketonuria An unusual or allergic reaction to montelukast, other medications, foods, dyes, or preservatives Pregnant or trying to get pregnant Breast-feeding How should I use this medication? Take this medication by mouth with water. Take it as directed on the prescription label at the same time every day. Chew or crush it completely before swallowing. Do not swallow tablets whole. You can take this medication with or without food. If it upsets your stomach, take it with food. Keep taking it unless your care team tells you to stop. A special MedGuide will be given to you by the  pharmacist with each prescription and refill. Be sure to read this information carefully each time. Talk to your care team about the use of this medication in children. While it may be prescribed for children as young as 2 years for selected conditions, precautions do apply. Overdosage: If you think you have taken too much of this medicine contact a poison control center or emergency room at once. NOTE: This medicine is only for you. Do not share this medicine with others. What if I miss a dose? If you miss a dose, skip it. Take your next dose at the normal time. Do not take extra or 2 doses at the same time to make up for the missed dose. What may interact with this medication? Medications for seizures like phenytoin, phenobarbital, and carbamazepine Rifabutin Rifampin This list may not describe all possible interactions. Give your health care provider a list  of all the medicines, herbs, non-prescription drugs, or dietary supplements you use. Also tell them if you smoke, drink alcohol, or use illegal drugs. Some items may interact with your medicine. What should I watch for while using this medication? Visit your health care provider for regular checks on your progress. Tell your health care provider if your allergy or asthma symptoms do not improve. Take your medication even when you do not have symptoms. If you have asthma, talk to your health care provider about what to do in an acute asthma attack. Always have your inhaled rescue medication for asthma attacks with you. Patients and their families should watch for new or worsening thoughts of suicide or depression. Also watch for sudden changes in feelings such as feeling anxious, agitated, panicky, irritable, hostile, aggressive, impulsive, severely restless, overly excited and hyperactive, or not being able to sleep. Any worsening of mood or thoughts of suicide or dying should be reported to your health care provider right away. What side  effects may I notice from receiving this medication? Side effects that you should report to your care team as soon as possible: Allergic reactions-skin rash, itching, hives, swelling of the face, lips, tongue, or throat Flu-like symptoms-fever, chills, muscle pain, cough, headache, fatigue Mood and behavior changes such as anxiety, nervousness, confusion, hallucinations, irritability, hostility, thoughts of suicide or self-harm, worsening mood, feelings of depression Pain, tingling, or numbness in the hands or feet Sinus pain or pressure around the face or forehead Trouble sleeping Vivid dreams or nightmares Side effects that usually do not require medical attention (report to your care team if they continue or are bothersome): Cough Diarrhea Headache Runny or stuffy nose Sore throat Stomach pain This list may not describe all possible side effects. Call your doctor for medical advice about side effects. You may report side effects to FDA at 1-800-FDA-1088. Where should I keep my medication? Keep out of the reach of children and pets. Store at room temperature between 15 and 30 degrees C (59 and 86 degrees F). Protect from light and moisture. Keep the container tightly closed. Get rid of any unused medication after the expiration date. To get rid of medications that are no longer needed or expired: Take the medication to a medication take-back program. Check with your pharmacy or law enforcement to find a location. If you cannot return the medication, check the label or package insert to see if the medication should be thrown out in the garbage or flushed down the toilet. If you are not sure, ask your care team. If it is safe to put in the trash, empty the medication out of the container. Mix the medication with cat litter, dirt, coffee grounds, or other unwanted substance. Seal the mixture in a bag or container. Put it in the trash. NOTE: This sheet is a summary. It may not cover all possible  information. If you have questions about this medicine, talk to your doctor, pharmacist, or health care provider.  2022 Elsevier/Gold Standard (2020-08-26 15:20:38)  Dexamethasone tablets What is this medication? DEXAMETHASONE (dex a METH a sone) is a corticosteroid. It is commonly used to treat inflammation of the skin, joints, lungs, and other organs. Common conditions treated include asthma, allergies, and arthritis. It is also used for other conditions, such as blood disorders and diseases of the adrenal glands. This medicine may be used for other purposes; ask your health care provider or pharmacist if you have questions. COMMON BRAND NAME(S): CUSHINGS SYNDROME DIAGNOSTIC, Decadron, Dexabliss, DexPak  Sterling Big, DexPak TaperPak, Dxevo, Hemady, HiDex, TaperDex, ZCORT, Zema-Pak, ZoDex, ZonaCort 11 Day, ZonaCort 7 Day What should I tell my care team before I take this medication? They need to know if you have any of these conditions: Cushing's syndrome diabetes glaucoma heart disease high blood pressure infection like herpes, measles, tuberculosis, or chickenpox kidney disease liver disease mental illness myasthenia gravis osteoporosis previous heart attack seizures stomach or intestine problems thyroid disease an unusual or allergic reaction to dexamethasone, corticosteroids, other medicines, lactose, foods, dyes, or preservatives pregnant or trying to get pregnant breast-feeding How should I use this medication? Take this medicine by mouth with a drink of water. Follow the directions on the prescription label. Take it with food or milk to avoid stomach upset. If you are taking this medicine once a day, take it in the morning. Do not take more medicine than you are told to take. Do not suddenly stop taking your medicine because you may develop a severe reaction. Your doctor will tell you how much medicine to take. If your doctor wants you to stop the medicine, the dose may be slowly  lowered over time to avoid any side effects. Talk to your pediatrician regarding the use of this medicine in children. Special care may be needed. Patients over 15 years old may have a stronger reaction and need a smaller dose. Overdosage: If you think you have taken too much of this medicine contact a poison control center or emergency room at once. NOTE: This medicine is only for you. Do not share this medicine with others. What if I miss a dose? If you miss a dose, take it as soon as you can. If it is almost time for your next dose, talk to your doctor or health care professional. You may need to miss a dose or take an extra dose. Do not take double or extra doses without advice. What may interact with this medication? Do not take this medicine with any of the following medications: live virus vaccines This medicine may also interact with the following medications: aminoglutethimide amphotericin B aspirin and aspirin-like medicines certain antibiotics like erythromycin, clarithromycin, and troleandomycin certain antivirals for HIV or hepatitis certain medicines for seizures like carbamazepine, phenobarbital, phenytoin certain medicines to treat myasthenia gravis cholestyramine cyclosporine digoxin diuretics ephedrine male hormones, like estrogen or progestins and birth control pills insulin or other medicines for diabetes isoniazid ketoconazole medicines that relax muscles for surgery mifepristone NSAIDs, medicines for pain and inflammation, like ibuprofen or naproxen rifampin skin tests for allergies thalidomide vaccines warfarin This list may not describe all possible interactions. Give your health care provider a list of all the medicines, herbs, non-prescription drugs, or dietary supplements you use. Also tell them if you smoke, drink alcohol, or use illegal drugs. Some items may interact with your medicine. What should I watch for while using this medication? Visit your  health care professional for regular checks on your progress. Tell your health care professional if your symptoms do not start to get better or if they get worse. Your condition will be monitored carefully while you are receiving this medicine. Wear a medical ID bracelet or chain. Carry a card that describes your disease and details of your medicine and dosage times. This medicine may increase your risk of getting an infection. Call your health care professional for advice if you get a fever, chills, or sore throat, or other symptoms of a cold or flu. Do not treat yourself. Try to avoid being around  people who are sick. Call your health care professional if you are around anyone with measles, chickenpox, or if you develop sores or blisters that do not heal properly. If you are going to need surgery or other procedures, tell your doctor or health care professional that you have taken this medicine within the last 12 months. Ask your doctor or health care professional about your diet. You may need to lower the amount of salt you eat. This medicine may increase blood sugar. Ask your healthcare provider if changes in diet or medicines are needed if you have diabetes. What side effects may I notice from receiving this medication? Side effects that you should report to your doctor or health care professional as soon as possible: allergic reactions like skin rash, itching or hives, swelling of the face, lips, or tongue bloody or black, tarry stools changes in emotions or moods changes in vision confusion, excitement, restlessness depressed mood eye pain hallucinations fever or chills, cough, sore throat, pain or difficulty passing urine muscle weakness severe or sudden stomach or belly pain signs and symptoms of high blood sugar such as being more thirsty or hungry or having to urinate more than normal. You may also feel very tired or have blurry vision. signs and symptoms of infection like fever;  chills; cough; sore throat; pain or trouble passing urine swelling of ankles, feet unusual bruising or bleeding wounds that do not heal Side effects that usually do not require medical attention (report to your doctor or health care professional if they continue or are bothersome): increased appetite increased growth of face or body hair headache nausea, vomiting skin problems, acne, thin and shiny skin trouble sleeping weight gain This list may not describe all possible side effects. Call your doctor for medical advice about side effects. You may report side effects to FDA at 1-800-FDA-1088. Where should I keep my medication? Keep out of the reach of children. Store at room temperature between 20 and 25 degrees C (68 and 77 degrees F). Protect from light. Throw away any unused medicine after the expiration date. NOTE: This sheet is a summary. It may not cover all possible information. If you have questions about this medicine, talk to your doctor, pharmacist, or health care provider.  2022 [employer] Standard (2019-04-13 14:23:34)  Daratumumab injection What is this medication? DARATUMUMAB (dar a toom ue mab) is a monoclonal antibody. It is used to treat multiple myeloma. This medicine may be used for other purposes; ask your health care provider or pharmacist if you have questions. COMMON BRAND NAME(S): DARZALEX What should I tell my care team before I take this medication? They need to know if you have any of these conditions: hereditary fructose intolerance infection (especially a virus infection such as chickenpox, herpes, or hepatitis B virus) lung or breathing disease (asthma, COPD) an unusual or allergic reaction to daratumumab, sorbitol, other medicines, foods, dyes, or preservatives pregnant or trying to get pregnant breast-feeding How should I use this medication? This medicine is for infusion into a vein. It is given by a health care professional in a hospital or  clinic setting. Talk to your pediatrician regarding the use of this medicine in children. Special care may be needed. Overdosage: If you think you have taken too much of this medicine contact a poison control center or emergency room at once. NOTE: This medicine is only for you. Do not share this medicine with others. What if I miss a dose? Keep appointments for follow-up doses as directed.  It is important not to miss your dose. Call your doctor or health care professional if you are unable to keep an appointment. What may interact with this medication? Interactions have not been studied. This list may not describe all possible interactions. Give your health care provider a list of all the medicines, herbs, non-prescription drugs, or dietary supplements you use. Also tell them if you smoke, drink alcohol, or use illegal drugs. Some items may interact with your medicine. What should I watch for while using this medication? Your condition will be monitored carefully while you are receiving this medicine. This medicine can cause serious allergic reactions. To reduce your risk, your health care provider may give you other medicine to take before receiving this one. Be sure to follow the directions from your health care provider. This medicine can affect the results of blood tests to match your blood type. These changes can last for up to 6 months after the final dose. Your healthcare provider will do blood tests to match your blood type before you start treatment. Tell all of your healthcare providers that you are being treated with this medicine before receiving a blood transfusion. This medicine can affect the results of some tests used to determine treatment response; extra tests may be needed to evaluate response. Do not become pregnant while taking this medicine or for 3 months after stopping it. Women should inform their health care provider if they wish to become pregnant or think they might be  pregnant. There is a potential for serious side effects to an unborn child. Talk to your health care provider for more information. Do not breast-feed an infant while taking this medicine. What side effects may I notice from receiving this medication? Side effects that you should report to your doctor or health care professional as soon as possible: allergic reactions (skin rash, itching, hives, swelling of the face, lips, or tongue) blurred vision infection (fever, chills, cough, sore throat, pain or difficulty passing urine) infusion reaction (dizziness, fast heartbeat, feeling faint or lightheaded, falls, headache, increase in blood pressure, nausea, vomiting, or wheezing or trouble breathing with loud or whistling sounds) unusual bleeding or bruising Side effects that usually do not require medical attention (report to your doctor or health care professional if they continue or are bothersome): constipation diarrhea pain, tingling, numbness in the hands or feet swelling of the ankles, feet, hands tiredness This list may not describe all possible side effects. Call your doctor for medical advice about side effects. You may report side effects to FDA at 1-800-FDA-1088. Where should I keep my medication? This drug is given in a hospital or clinic and will not be stored at home. NOTE: This sheet is a summary. It may not cover all possible information. If you have questions about this medicine, talk to your doctor, pharmacist, or health care provider.  2022 [employer] Standard (2020-11-09 12:50:38)

## 2021-06-26 ENCOUNTER — Encounter: Payer: Self-pay | Admitting: Oncology

## 2021-06-26 ENCOUNTER — Inpatient Hospital Stay (HOSPITAL_BASED_OUTPATIENT_CLINIC_OR_DEPARTMENT_OTHER): Payer: Medicare Other | Admitting: Oncology

## 2021-06-26 ENCOUNTER — Inpatient Hospital Stay: Payer: Medicare Other

## 2021-06-26 VITALS — BP 159/96 | HR 60 | Temp 98.1°F | Resp 18 | Wt 202.0 lb

## 2021-06-26 DIAGNOSIS — C9000 Multiple myeloma not having achieved remission: Secondary | ICD-10-CM | POA: Diagnosis not present

## 2021-06-26 DIAGNOSIS — D696 Thrombocytopenia, unspecified: Secondary | ICD-10-CM

## 2021-06-26 DIAGNOSIS — Z5111 Encounter for antineoplastic chemotherapy: Secondary | ICD-10-CM

## 2021-06-26 DIAGNOSIS — N1831 Chronic kidney disease, stage 3a: Secondary | ICD-10-CM | POA: Diagnosis not present

## 2021-06-26 DIAGNOSIS — D701 Agranulocytosis secondary to cancer chemotherapy: Secondary | ICD-10-CM

## 2021-06-26 DIAGNOSIS — T451X5A Adverse effect of antineoplastic and immunosuppressive drugs, initial encounter: Secondary | ICD-10-CM

## 2021-06-26 DIAGNOSIS — Z5112 Encounter for antineoplastic immunotherapy: Secondary | ICD-10-CM | POA: Diagnosis not present

## 2021-06-26 LAB — CBC WITH DIFFERENTIAL/PLATELET
Abs Immature Granulocytes: 0.01 10*3/uL (ref 0.00–0.07)
Basophils Absolute: 0 10*3/uL (ref 0.0–0.1)
Basophils Relative: 1 %
Eosinophils Absolute: 0 10*3/uL (ref 0.0–0.5)
Eosinophils Relative: 1 %
HCT: 38.3 % — ABNORMAL LOW (ref 39.0–52.0)
Hemoglobin: 13 g/dL (ref 13.0–17.0)
Immature Granulocytes: 0 %
Lymphocytes Relative: 42 %
Lymphs Abs: 1.4 10*3/uL (ref 0.7–4.0)
MCH: 34.1 pg — ABNORMAL HIGH (ref 26.0–34.0)
MCHC: 33.9 g/dL (ref 30.0–36.0)
MCV: 100.5 fL — ABNORMAL HIGH (ref 80.0–100.0)
Monocytes Absolute: 0.4 10*3/uL (ref 0.1–1.0)
Monocytes Relative: 12 %
Neutro Abs: 1.5 10*3/uL — ABNORMAL LOW (ref 1.7–7.7)
Neutrophils Relative %: 44 %
Platelets: 97 10*3/uL — ABNORMAL LOW (ref 150–400)
RBC: 3.81 MIL/uL — ABNORMAL LOW (ref 4.22–5.81)
RDW: 13.4 % (ref 11.5–15.5)
WBC: 3.4 10*3/uL — ABNORMAL LOW (ref 4.0–10.5)
nRBC: 0 % (ref 0.0–0.2)

## 2021-06-26 LAB — COMPREHENSIVE METABOLIC PANEL
ALT: 26 U/L (ref 0–44)
AST: 17 U/L (ref 15–41)
Albumin: 4.6 g/dL (ref 3.5–5.0)
Alkaline Phosphatase: 65 U/L (ref 38–126)
Anion gap: 8 (ref 5–15)
BUN: 24 mg/dL — ABNORMAL HIGH (ref 8–23)
CO2: 26 mmol/L (ref 22–32)
Calcium: 9.5 mg/dL (ref 8.9–10.3)
Chloride: 100 mmol/L (ref 98–111)
Creatinine, Ser: 1.33 mg/dL — ABNORMAL HIGH (ref 0.61–1.24)
GFR, Estimated: 59 mL/min — ABNORMAL LOW (ref >60)
Glucose, Bld: 105 mg/dL — ABNORMAL HIGH (ref 70–99)
Potassium: 4.2 mmol/L (ref 3.5–5.1)
Sodium: 134 mmol/L — ABNORMAL LOW (ref 135–145)
Total Bilirubin: 0.8 mg/dL (ref 0.3–1.2)
Total Protein: 7.5 g/dL (ref 6.5–8.1)

## 2021-06-26 NOTE — Progress Notes (Signed)
Patient here today for routine follow up and xgeva injection. Patient has some intermittent cramping in his right foot. No other complaints at this time.

## 2021-06-26 NOTE — Progress Notes (Signed)
Hematology/Oncology  Follow up note [facility] [phone] [phone]) [phone]   Patient Care Team: Tonia Ghent, MD as PCP - General (Family Medicine) Earlie Server, MD as Consulting Physician (Oncology) Beather Arbour Lilyan Punt, MD as Referring Physician (Hematology and Oncology) Diannia Ruder, MD as Referring Physician (Hematology and Oncology)  REASON FOR VISIT Follow up for multiple myeloma.   HISTORY OF PRESENTING ILLNESS:  Luis Burke is a  65 y.o.  male with PMH listed below presents for follow up of multiple myeloma.  # 07/27/2018 multiple myeloma panel showed M protein of 0.1, IgG 662, IgA 49, IgM 7. Patient was called back to further labs done. 08/10/2018, free light chain ratio showed extremely high level of kappa free light chain 10,183, with a kappa lambda light chain ratio of 1414.31 LDH 164 Beta-2 microglobulin 5 Patient was called and discuss about results.  He was recommended to undergo bone marrow biopsy and PET scan. 08/19/2018 bone marrow biopsy showed hypercellular marrow 80%, involved by plasma cell neoplasm up to 95%.  Consistent with plasma cell myeloma. Myeloma FISH  gain of gain of CEP12  09/10/2018 skeletal survey showed questionable small lucent lesions within the midshaft of the humerus bilaterally.  Otherwise no suspicious focal lytic lesion or acute bone abnormality. 08/25/2018 PET scan showed no definite hypermetabolic bone disease but CT findings are highly suspicious for numerous small myelomatous lesions involving the spine, sternum and scattered ribs.  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 He received preparative regimen with melphalan 200 mg/m on 04/22/2019 followed by autologous stem cell infusion on 04/23/2019. Currently he is transfusion independent.  Transfusion criteria with as needed hemoglobin less than 7.5 for platelet less than 10,000.  Transplant course was complicated with  febrile neutropenia grade 3, treated with vancomycin and cephapirin until engraftment on 05/06/2019. Patient also had engraftment syndrome and had to be started on Solu-Medrol 25 mg twice daily on 05/05/2019.  Transition to Medrol Dosepak on 05/07/2019 to complete steroid taper as outpatient.  Patient is currently on acyclovir for 1 year after transplant, dose was reduced on 722 11/20/2018 due to renal function. Patient had a baseline CKD with creatinine running between 1.4-1.6.  He had acute on chronic kidney injury and creatinine went up to 2.2 on 722.  Fluid hydration was given and creatinine decreased to baseline 1.5-1.7. His Hickman catheter was removed.  Candida Cruris treated with topical clotrimazole 1% 3 times daily and to resolution..  #  seen by Dr. Tonia Brooms BMT team on 06/30/2019.revaccination at [facility] begins at 12 months post transplant I discussed with [facility] hematology Dr.Choi and he recommends plans as listed below.  -patient to be started on Ixazomib maintenance: -Ixazomib 77m on D1/D8/D15 out of 28 day cycle.  -patient will start re-vaccination 12 months post transplant.  -Post transplant bone marrow biopsy if clinically indicated.  # seen by [facility] bone marrow transplant team in June 2021.  Patient has been started on immunization protocol.  He reports no new complaints.  #[facility]  Lucent lesion in the L4 spinous process measuring 4 mm which is  nonspecific. Consider confirmation of marrow replacing process with MRI.  2.  Incompletely characterized renal cysts   # He continues to have chronic back pain and neck pain.  [facility] MRI lumbar spine with and without contrast showed no evidence of spinal metastasis.  Lumbar spondylosis is most pronounced  at L5-S1 where there is severe right and moderate left neural foraminal stenosis. 08/11/2020, x-ray cervical spine complete with flexion and extension showed The cervical spine  is visualized from C1 to the top of T1 on the lateral  view.  Reversal of the normal cervical lordosis with a focal kyphosis centered at the C5-C6 intervertebral level. Trace anterolisthesis of C4 onto C5. Trace retrolisthesis of C6 on C7. No evidence of dynamic instability is noted on  the flexion or extension views.   No prevertebral soft tissue swelling. Cervical vertebral body heights are maintained. Multilevel degenerative changes of the cervical spine including discogenic disease, most pronounced at C5-C6 and C6-C7, and facet arthropathy most pronounced at C4-C5. Multilevel mild to moderate left neural foraminal osseous narrowing of the mid to lower cervical vertebrae, possibly centrally/artifactual by patient positioning.  #07/28/2020 bone marrow normocellular marrow with polytypic plasmacytosis, 3 to 8%   Patient is in remission.  Cytogenetics negative for trisomy 12.  Negative myeloma FISH panel.   # Lumbar lesion,  11/13/20 [facility]  MRI cervical sine w/wo contrast - no evidence of metastatic disease C3-4 severe spinal stenosis, left neural foraminal narrowing. C4-5 spinal canal stenosis. [facility] MRI results showed no metastatic spine lesion. Chronic back pain and neck pain, images are consistent with chronic degenerative disease.  Patient continues to follow-up with [facility] orthopedic surgeon.  04/03/2021, light chain ratio increased to 3.76.   05/15/2021 bone marrow biopsy showed slightly hypercellular bone marrow for age with slight plasmacytosis.  5% plasma cells with kappa light chain restriction.  Cytogenetics normal.  Myeloma FISH negative # 05/15/2021 bone marrow biopsy showed slightly hypercellular bone marrow for age with slight plasmacytosis.  5% plasma cells with kappa light chain restriction.  Cytogenetics normal.  Myeloma FISH negative  # 05/31/2021 Daratumumab -Pomalyst-dexamethasone-D1 patient had grade 2 infusion reaction to daratumumab and received steroids, Benadryl, famotidine.  He was  able to finish treatment.D8 was able to finish Daratumumab with no infusion reactions.  INTERVAL HISTORY Luis Burke is a 65 y.o. male who has above history reviewed by me today for follow-up  Currently on Daratumumab Pomalyst dexamethasone Patient tolerates well. Denies any allergic reaction to daratumumab. Patient denies any nausea vomiting diarrhea.  Chronic back pain unchanged.   Review of Systems  Constitutional:  Negative for appetite change, chills, fatigue, fever and unexpected weight change.  HENT:   Negative for hearing loss and voice change.   Eyes:  Negative for eye problems and icterus.  Respiratory:  Negative for chest tightness, cough and shortness of breath.   Cardiovascular:  Negative for chest pain and leg swelling.  Gastrointestinal:  Negative for abdominal distention and abdominal pain.  Endocrine: Negative for hot flashes.  Genitourinary:  Negative for difficulty urinating, dysuria and frequency.   Musculoskeletal:  Positive for back pain. Negative for arthralgias and neck pain.  Skin:  Negative for itching and rash.  Neurological:  Negative for light-headedness and numbness.  Hematological:  Negative for adenopathy. Does not bruise/bleed easily.  Psychiatric/Behavioral:  Negative for confusion.    MEDICAL HISTORY:  Past Medical History:  Diagnosis Date   AKI (acute kidney injury) ([place]) 01/06/2019   Anemia    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[place])    autologous stem cell bone marrow transplant on 04/23/2019.   Fatty liver    on u/s 07/2018   Hyperlipidemia 03/2004   Hypertension 1994   Multiple myeloma ([place])    Snores     SURGICAL HISTORY: Past Surgical History:  Procedure  Laterality Date   CATARACT EXTRACTION W/PHACO Right 03/04/2018   Procedure: CATARACT EXTRACTION PHACO AND INTRAOCULAR LENS PLACEMENT ([place])  RIGHT TORIC;  Surgeon: Leandrew Koyanagi, MD;  Location: [place];  Service: Ophthalmology;  Laterality: Right;  [place] no [place]  Lens 1:45 5.3   COLONOSCOPY WITH PROPOFOL N/A 07/20/2018   Procedure: COLONOSCOPY WITH PROPOFOL;  Surgeon: Jonathon Bellows, MD;  Location: [facility] ENDOSCOPY;  Service: Gastroenterology;  Laterality: N/A;   ESOPHAGOGASTRODUODENOSCOPY (EGD) WITH PROPOFOL N/A 07/20/2018   Procedure: ESOPHAGOGASTRODUODENOSCOPY (EGD) WITH PROPOFOL;  Surgeon: Jonathon Bellows, MD;  Location[facility]COPY;  Service: Gastroenterology;  Laterality: N/A;   EYE SURGERY Right    HERNIA REPAIR     L Lap herniorraphy  04/2000   Left wrist ganglionectomy      SOCIAL HISTORY: Social History   Socioeconomic History   Marital status: Single    Spouse name: Not on file   Number of children: 1   Years of education: Not on file   Highest education level: Not on file  Occupational History   Occupation: [employer]  Tobacco Use   Smoking status: Never   Smokeless tobacco: Never   Tobacco comments:    Occassionally  Vaping Use   Vaping Use: Never used  Substance and Sexual Activity   Alcohol use: Not Currently    Alcohol/week: 3.0 standard drinks    Types: 3 Cans of beer per week   Drug use: No   Sexual activity: Not on file  Other Topics Concern   Not on file  Social History Narrative   Divorced 23-Dec-2007, dating as of Dec 22, 2017   His daughter died 4 days after giving birth to the patient's granddaughter   Has joint custody of his dead daughter's child   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 Strain: Not on [employer] Insecurity: Not on file  Transportation Needs: Not on file  Physical Activity: Not on file  Stress: Not on file  Social Connections: Not on file  Intimate Partner Violence: Not on file    FAMILY HISTORY: Family History  Problem Relation Age of Onset   Hypertension Mother    Diabetes Mother    Heart disease Mother        CAD   Dementia Mother    Hypertension Father    Heart disease Father        MI 02/03   Colon cancer Father    Hypertension  Sister    Hypertension Sister    Hypertension Sister    Prostate cancer Neg Hx     ALLERGIES:  is allergic to bactrim [sulfamethoxazole-trimethoprim], clonidine derivatives, and tadalafil.  MEDICATIONS:  Current Outpatient Medications  Medication Sig Dispense Refill   acetaminophen (TYLENOL) 325 MG tablet Take 2 tablets (650 mg total) by mouth See admin instructions. 1 hour prior to chemotherapy treatments 30 tablet 0   amLODipine (NORVASC) 5 MG tablet TAKE 2 TABLETS BY MOUTH EVERY DAY 180 tablet 1   ASPIRIN 81 PO Take 81 mg by mouth daily.     B Complex Vitamins (VITAMIN B COMPLEX PO) SMARTSIG:By Mouth     chlorhexidine (PERIDEX) 0.12 % solution Use as directed 15 mLs in the mouth or throat 2 (two) times daily. 473 mL 1   clopidogrel (PLAVIX) 75 MG tablet Take by mouth.     co-enzyme Q-10 50 MG capsule Take 50 mg by mouth daily.     [employer] VITAMIN B12 1000  MCG tablet TAKE 1 TABLET BY MOUTH EVERY DAY 90 tablet 3   diphenhydrAMINE (BENADRYL) 50 MG tablet Take 1 tablet (50 mg total) by mouth See admin instructions. Take 1 tablet 1 hour prior to chemotherapy treatments. 30 tablet 0   hydrALAZINE (APRESOLINE) 10 MG tablet TAKE 1 TABLET (10 MG TOTAL) BY MOUTH 3 (THREE) TIMES DAILY. 270 tablet 1   lisinopril (ZESTRIL) 20 MG tablet Take 1 tablet (20 mg total) by mouth daily. Please keep appt on 07/05/21 90 tablet 0   LORazepam (ATIVAN) 1 MG tablet TAKE 1 TABLET (1 MG TOTAL) BY MOUTH EVERY 6 (SIX) HOURS AS NEEDED FOR ANXIETY (FOR NAUSEA)     magnesium chloride (SLOW-MAG) 64 MG TBEC SR tablet Take 1 tablet (64 mg total) by mouth daily. 60 tablet 0   metoprolol succinate (TOPROL-XL) 50 MG 24 hr tablet TAKE 4 TABLETS (200 MG TOTAL) BY MOUTH DAILY. TAKE WITH OR IMMEDIATELY FOLLOWING A MEAL. 360 tablet 2   montelukast (SINGULAIR) 10 MG tablet Take 1 tablet (10 mg total) by mouth See admin instructions. Take 1 hour prior to chemotherapy 30 tablet 0   ondansetron (ZOFRAN) 8 MG tablet Take 1 tablet (8 mg  total) by mouth every 8 (eight) hours as needed. for nausea 30 tablet 1   pravastatin (PRAVACHOL) 10 MG tablet TAKE 1 TABLET BY MOUTH EVERY DAY 90 tablet 3   zinc gluconate 50 MG tablet Take 50 mg by mouth daily.     acyclovir (ZOVIRAX) 400 MG tablet Take 400 mg by mouth 2 (two) times daily. (Patient not taking: No sig reported)     cyclobenzaprine (FLEXERIL) 5 MG tablet Take 1 tablet (5 mg total) by mouth 3 (three) times daily as needed for muscle spasms. (Patient not taking: No sig reported) 30 tablet 0   dexamethasone (DECADRON) 4 MG tablet Take 5 tablets (20 mg total) by mouth once a week. Take at least 1 hour prior to infusion appt. Take with food. To start on 06/07/21. (Patient not taking: Reported on 06/26/2021) 20 tablet 1   loperamide (IMODIUM) 2 MG capsule Take 1 capsule (2 mg total) by mouth See admin instructions. Take 2 capsules at the onset of diarrhea, then 1 capsule every 2 hours or after every loose bowel movement. Maximum 8 capsules per 24 hours. (Patient not taking: No sig reported) 60 capsule 0   pomalidomide (POMALYST) 2 MG capsule Take 1 capsule (2 mg total) by mouth daily. Take for 21 days on, then hold for 7 days. Repeat every 28 days. (Patient not taking: Reported on 06/26/2021) 21 capsule 0   prochlorperazine (COMPAZINE) 10 MG tablet TAKE 1 TABLET (10 MG TOTAL) BY MOUTH EVERY 6 (SIX) HOURS AS NEEDED FOR NAUSEA (Patient not taking: No sig reported)     No current facility-administered medications for this visit.     PHYSICAL EXAMINATION: ECOG PERFORMANCE STATUS: 1 - Symptomatic but completely ambulatory Vitals:   06/26/21 0924  BP: (!) 159/96  Pulse: 60  Resp: 18  Temp: 98.1 F (36.7 C)   Filed Weights   06/26/21 0924  Weight: 202 lb (91.6 kg)    Physical Exam Constitutional:      General: He is not in acute distress. HENT:     Head: Normocephalic and atraumatic.  Eyes:     General: No scleral icterus. Cardiovascular:     Rate and Rhythm: Normal rate and  regular rhythm.     Heart sounds: Normal heart sounds.  Pulmonary:  Effort: Pulmonary effort is normal. No respiratory distress.     Breath sounds: No wheezing.  Abdominal:     General: Bowel sounds are normal. There is no distension.     Palpations: Abdomen is soft.  Musculoskeletal:        General: No deformity. Normal range of motion.     Cervical back: Normal range of motion and neck supple.  Skin:    General: Skin is warm and dry.     Findings: No erythema or rash.  Neurological:     Mental Status: He is alert and oriented to person, place, and time. Mental status is at baseline.     Cranial Nerves: No cranial nerve deficit.     Coordination: Coordination normal.  Psychiatric:        Mood and Affect: Mood normal.     LABORATORY DATA:  I have reviewed the data as listed Lab Results  Component Value Date   WBC 3.4 (L) 06/26/2021   HGB 13.0 06/26/2021   HCT 38.3 (L) 06/26/2021   MCV 100.5 (H) 06/26/2021   PLT 97 (L) 06/26/2021   Recent Labs    06/12/21 1249 06/19/21 1449 06/26/21 0857  NA 135 135 134*  K 4.6 4.1 4.2  CL 99 101 100  CO2 $Re'27 25 26  'vWs$ GLUCOSE 105* 94 105*  BUN 26* 22 24*  CREATININE 1.75* 1.12 1.33*  CALCIUM 9.8 9.3 9.5  GFRNONAA 43* >60 59*  PROT 7.3 7.1 7.5  ALBUMIN 4.5 4.3 4.6  AST $Re'16 15 17  'KLC$ ALT 30 40 26  ALKPHOS 59 64 65  BILITOT 0.8 0.5 0.8    Iron/TIBC/Ferritin/ %Sat    Component Value Date/Time   IRON 74 07/01/2018 0944   TIBC 250 07/01/2018 0944   FERRITIN 357 07/01/2018 0944   IRONPCTSAT 30 07/01/2018 0944    Lab Results  Component Value Date   TOTALPROTELP 7.0 05/01/2021   ALBUMINELP 4.0 03/08/2021   A1GS 0.2 03/08/2021   A2GS 0.7 03/08/2021   BETS 1.1 03/08/2021   GAMS 1.1 03/08/2021   MSPIKE Not Observed 03/08/2021   SPEI Comment 03/08/2021   Lab Results  Component Value Date   KPAFRELGTCHN 60.4 (H) 05/01/2021   LAMBDASER 15.2 05/01/2021   KAPLAMBRATIO 3.97 (H) 05/01/2021    RADIOGRAPHIC STUDIES: I have  personally reviewed the radiological images as listed and agreed with the findings in the report. MR Lumbar Spine W Wo Contrast  Result Date: 06/19/2021 CLINICAL DATA:  Back pain, multiple myeloma EXAM: MRI LUMBAR SPINE WITHOUT AND WITH CONTRAST TECHNIQUE: Multiplanar and multiecho pulse sequences of the lumbar spine were obtained without and with intravenous contrast. CONTRAST:  10mL GADAVIST GADOBUTROL 1 MMOL/ML IV SOLN COMPARISON:  None. FINDINGS: Segmentation:  Standard. Alignment:  No significant listhesis. Vertebrae: Vertebral body heights are maintained. No substantial marrow edema. No suspicious osseous lesion. Conus medullaris and cauda equina: Conus extends to the T12-L1 level. Conus and cauda equina appear normal. No abnormal intrathecal enhancement. Paraspinal and other soft tissues: Probable bilateral renal cysts partially imaged. Disc levels: L1-L2:  No canal or foraminal stenosis. L2-L3:  No canal or foraminal stenosis. L3-L4:  Disc bulge.  No canal or foraminal stenosis. L4-L5: Disc bulge. Central and left subarticular annular fissure. No canal or foraminal stenosis. L5-S1: Disc bulge with endplate osteophytic ridging. Facet arthropathy. No canal stenosis. Moderate right and mild left foraminal stenosis. Potential exiting right L5 nerve root compression. IMPRESSION: No evidence of myelomatous involvement. No compression fracture. Mild lower lumbar degenerative  changes. There is right foraminal narrowing at L5-S1 with potential exiting nerve root compression. Electronically Signed   By: Macy Mis M.D.   On: 06/19/2021 12:20   CT BONE MARROW BIOPSY & ASPIRATION  Result Date: 05/15/2021 CLINICAL DATA:  History of multiple myeloma with possible relapse due to increased light chains. Need for bone marrow biopsy for further evaluation. EXAM: CT GUIDED BONE MARROW ASPIRATION AND BIOPSY ANESTHESIA/SEDATION: Versed 2.0 mg IV, Fentanyl 100 mcg IV Total Moderate Sedation Time:   13 minutes. The  patient's level of consciousness and physiologic status were continuously monitored during the procedure by Radiology nursing. PROCEDURE: The procedure risks, benefits, and alternatives were explained to the patient. Questions regarding the procedure were encouraged and answered. The patient understands and consents to the procedure. A time out was performed prior to initiating the procedure. The right gluteal region was prepped with chlorhexidine. Sterile gown and sterile gloves were used for the procedure. Local anesthesia was provided with 1% Lidocaine. Under CT guidance, an 11 gauge On Control bone cutting needle was advanced from a posterior approach into the right iliac bone. Needle positioning was confirmed with CT. Initial non heparinized and heparinized aspirate samples were obtained of bone marrow. Core biopsy was performed via the On Control drill needle. Two separate core biopsy samples were obtained. COMPLICATIONS: None FINDINGS: Inspection of initial aspirate did reveal visible particles. Intact core biopsy samples were obtained. IMPRESSION: CT guided bone marrow biopsy of right posterior iliac bone with both aspirate and core samples obtained. Electronically Signed   By: Aletta Edouard M.D.   On: 05/15/2021 10:51      ASSESSMENT & PLAN:  1. Multiple myeloma not having achieved remission ([place])   2. Encounter for antineoplastic chemotherapy   3. Thrombocytopenia ([facility])   4. Stage 3a chronic kidney disease ([place])   5. Chemotherapy induced neutropenia ([facility])    #Light chain multiple myeloma beta 2 microglobulin 5 and normal albumin.  Stage II.cytogenetics showed normal male chromosome, MDS FISH panel showed trisomy 53- Standard Risk.S/p RVD x 9 and  Autologous bone marrow stem cell transplant at [facility] on 04/23/2019.' Early relapse multiple myeloma. Labs reviewed and discussed with patient. Patient was recommended to take singular and Claritin 1 day prior to his treatment on 06/26/2021. 06/26/2021  to proceed with cycle 2 day 1 Daratumumab subcutaneous/dexamethasone 20 mg weekly. Recommend patient to start Pomalyst 2 mg.  Plan 3 weeks on 1 week off.  CBC will be monitored weekly.  # Neutropenia, grade 3 Resolved.  Continue monitor closely.  # Thrombocytopenia, monitor closely.  Platelet count 97,000. # CKD, avoid nephrotoxin.  Creatinine stable.   #Back pain, MRI lumbar spine showed no suspicious myeloma lesions  Post bone marrow transplant care He has completed 1 year of acyclovir.  Off acyclovir now. Post transplant vaccination [facility] at [facility].   #Bone health/osteopenia/multiple myeloma-  Continue Xgeva monthly.- next 06/30/21 Continue calcium and vitamin D supplementation. Follow up 1 week for evaluation prior to starting next Daratumumab treatment.  Earlie Server, MD, PhD Hematology Oncology [place] at [facility] 06/26/2021

## 2021-06-27 LAB — KAPPA/LAMBDA LIGHT CHAINS
Kappa free light chain: 28.5 mg/L — ABNORMAL HIGH (ref 3.3–19.4)
Kappa, lambda light chain ratio: 2.85 — ABNORMAL HIGH (ref 0.26–1.65)
Lambda free light chains: 10 mg/L (ref 5.7–26.3)

## 2021-06-27 NOTE — Progress Notes (Signed)
[facility]  [phone] [phone]  Patient Care Team: Tonia Ghent, MD as PCP - General (Family Medicine) Earlie Server, MD as Consulting Physician (Oncology) Beather Arbour Lilyan Punt, MD as Referring Physician (Hematology and Oncology) Diannia Ruder, MD as Referring Physician (Hematology and Oncology)   Name of the patient: Luis Burke  [id number]  1956/01/24   Date of visit: 06/28/21  HPI: Patient is a 65 y.o. male with relapsed multiple myeloma s/p auto transplant in 2020. He was on ixazomib for maintenance therapy post transplant. Currently treatment with Pomalyst (pomalidomide), dartumumab, and dexamethasone.   Reason for Consult: Oral chemotherapy follow-up for pomalidomide therapy.   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   Fatty liver    on u/s 07/2018   Hyperlipidemia 03/2004   Hypertension 1994   Multiple myeloma ([place])    Snores     HEMATOLOGY/ONCOLOGY HISTORY:  Oncology History  Multiple myeloma ([place])  08/26/2018 Initial Diagnosis   Multiple myeloma ([place])   08/28/2018 - 03/17/2019 Chemotherapy   The patient had dexamethasone (DECADRON) 4 MG tablet, 1 of 1 cycle, Start date: --, End date: -- bortezomib SQ (VELCADE) chemo injection 2.75 mg, 1.3 mg/m2 = 2.75 mg, Subcutaneous,  Once, 10 of 10 cycles Administration: 2.75 mg (08/28/2018), 2.75 mg (09/04/2018), 2.75 mg (09/15/2018), 2.75 mg (09/22/2018), 2.75 mg (09/29/2018), 2.75 mg (10/06/2018), 2.75 mg (10/13/2018), 2.75 mg (10/20/2018), 2.75 mg (10/27/2018), 2.75 mg (11/03/2018), 2.75 mg (11/10/2018), 2.75 mg (11/17/2018), 2.75 mg (11/24/2018), 2.75 mg (12/01/2018), 2.75 mg (12/08/2018), 2.75 mg (12/16/2018), 2.75 mg (12/23/2018), 2.75 mg (12/30/2018), 2.75 mg (01/06/2019), 2.75 mg (01/13/2019),  2.75 mg (01/20/2019), 2.75 mg (01/27/2019), 2.75 mg (02/03/2019), 2.75 mg (02/10/2019), 2.75 mg (02/17/2019), 2.75 mg (02/24/2019), 2.75 mg (03/03/2019), 2.75 mg (03/10/2019), 2.75 mg (03/17/2019)   for chemotherapy treatment.     07/21/2019 - 07/21/2019 Chemotherapy          05/31/2021 -  Chemotherapy    Patient is on Treatment Plan: MYELOMA DARATUMUMAB + POMALIDOMIDE + DEXAMETHASONE Q28D X 7 CYCLES         ALLERGIES:  is allergic to bactrim [sulfamethoxazole-trimethoprim], clonidine derivatives, and tadalafil.  MEDICATIONS:  Current Outpatient Medications  Medication Sig Dispense Refill   acetaminophen (TYLENOL) 325 MG tablet Take 2 tablets (650 mg total) by mouth See admin instructions. 1 hour prior to chemotherapy treatments 30 tablet 0   acyclovir (ZOVIRAX) 400 MG tablet Take 400 mg by mouth 2 (two) times daily. (Patient not taking: No sig reported)     amLODipine (NORVASC) 5 MG tablet TAKE 2 TABLETS BY MOUTH EVERY DAY 180 tablet 1   ASPIRIN 81 PO Take 81 mg by mouth daily.     B Complex Vitamins (VITAMIN B COMPLEX PO) SMARTSIG:By Mouth     chlorhexidine (PERIDEX) 0.12 % solution Use as directed 15 mLs in the mouth or throat 2 (two) times daily. 473 mL 1   clopidogrel (PLAVIX) 75 MG tablet Take by mouth.     co-enzyme Q-10 50 MG capsule Take 50 mg by mouth daily.     [employer] VITAMIN B12 1000 MCG tablet TAKE 1 TABLET BY MOUTH EVERY DAY 90 tablet 3   cyclobenzaprine (FLEXERIL) 5 MG tablet Take 1 tablet (5 mg total) by mouth 3 (three) times daily as needed for muscle  spasms. (Patient not taking: No sig reported) 30 tablet 0   dexamethasone (DECADRON) 4 MG tablet Take 5 tablets (20 mg total) by mouth once a week. Take at least 1 hour prior to infusion appt. Take with food. To start on 06/07/21. (Patient not taking: Reported on 06/26/2021) 20 tablet 1   diphenhydrAMINE (BENADRYL) 50 MG tablet Take 1 tablet (50 mg total) by mouth See admin instructions. Take 1 tablet 1 hour prior to chemotherapy  treatments. 30 tablet 0   hydrALAZINE (APRESOLINE) 10 MG tablet TAKE 1 TABLET (10 MG TOTAL) BY MOUTH 3 (THREE) TIMES DAILY. 270 tablet 1   lisinopril (ZESTRIL) 20 MG tablet Take 1 tablet (20 mg total) by mouth daily. Please keep appt on 07/05/21 90 tablet 0   loperamide (IMODIUM) 2 MG capsule Take 1 capsule (2 mg total) by mouth See admin instructions. Take 2 capsules at the onset of diarrhea, then 1 capsule every 2 hours or after every loose bowel movement. Maximum 8 capsules per 24 hours. (Patient not taking: No sig reported) 60 capsule 0   LORazepam (ATIVAN) 1 MG tablet TAKE 1 TABLET (1 MG TOTAL) BY MOUTH EVERY 6 (SIX) HOURS AS NEEDED FOR ANXIETY (FOR NAUSEA)     magnesium chloride (SLOW-MAG) 64 MG TBEC SR tablet Take 1 tablet (64 mg total) by mouth daily. 60 tablet 0   metoprolol succinate (TOPROL-XL) 50 MG 24 hr tablet TAKE 4 TABLETS (200 MG TOTAL) BY MOUTH DAILY. TAKE WITH OR IMMEDIATELY FOLLOWING A MEAL. 360 tablet 2   montelukast (SINGULAIR) 10 MG tablet Take 1 tablet (10 mg total) by mouth See admin instructions. Take 1 hour prior to chemotherapy 30 tablet 0   ondansetron (ZOFRAN) 8 MG tablet Take 1 tablet (8 mg total) by mouth every 8 (eight) hours as needed. for nausea 30 tablet 1   pomalidomide (POMALYST) 2 MG capsule Take 1 capsule (2 mg total) by mouth daily. Take for 21 days on, then hold for 7 days. Repeat every 28 days. (Patient not taking: Reported on 06/26/2021) 21 capsule 0   pravastatin (PRAVACHOL) 10 MG tablet TAKE 1 TABLET BY MOUTH EVERY DAY 90 tablet 3   prochlorperazine (COMPAZINE) 10 MG tablet TAKE 1 TABLET (10 MG TOTAL) BY MOUTH EVERY 6 (SIX) HOURS AS NEEDED FOR NAUSEA (Patient not taking: No sig reported)     zinc gluconate 50 MG tablet Take 50 mg by mouth daily.     No current facility-administered medications for this visit.   Facility-Administered Medications Ordered in Other Visits  Medication Dose Route Frequency Provider Last Rate Last Admin   acetaminophen (TYLENOL)  tablet 650 mg  650 mg Oral Once Earlie Server, MD       daratumumab-hyaluronidase-fihj [facility] FASPRO) 1800-30000 MG-UT/15ML chemo SQ injection 1,800 mg  1,800 mg Subcutaneous Once Earlie Server, MD        VITAL SIGNS: There were no vitals taken for this visit. There were no vitals filed for this visit.  Estimated body mass index is 28.98 kg/m as calculated from the following:   Height as of 05/15/21: 5' 10" (1.778 m).   Weight as of 06/26/21: 91.6 kg (202 lb).  LABS: CBC:    Component Value Date/Time   WBC 3.4 (L) 06/26/2021 0857   HGB 13.0 06/26/2021 0857   HCT 38.3 (L) 06/26/2021 0857   PLT 97 (L) 06/26/2021 0857   MCV 100.5 (H) 06/26/2021 0857   NEUTROABS 1.5 (L) 06/26/2021 0857   LYMPHSABS 1.4 06/26/2021 0857   MONOABS  0.4 06/26/2021 0857   EOSABS 0.0 06/26/2021 0857   BASOSABS 0.0 06/26/2021 0857   Comprehensive Metabolic Panel:    Component Value Date/Time   NA 134 (L) 06/26/2021 0857   K 4.2 06/26/2021 0857   CL 100 06/26/2021 0857   CO2 26 06/26/2021 0857   BUN 24 (H) 06/26/2021 0857   CREATININE 1.33 (H) 06/26/2021 0857   GLUCOSE 105 (H) 06/26/2021 0857   CALCIUM 9.5 06/26/2021 0857   AST 17 06/26/2021 0857   ALT 26 06/26/2021 0857   ALKPHOS 65 06/26/2021 0857   BILITOT 0.8 06/26/2021 0857   PROT 7.5 06/26/2021 0857   ALBUMIN 4.6 06/26/2021 0857     Present during today's visit: patient only, seen in infusion  Assessment and Plan: C2/D1, start pomalidomide 2mg 1 days on/7 days off today.    Oral Chemotherapy Side Effect/Intolerance:  Fatigue: This is much improved compared to his first week of treatment. He is able to work out regularly, he walks about 15-20 mins in the treadmill and does some crunches on a workout ball. After treatment today, he is taking a trip out to the beach. Overall is he feeling good and able to keep up with her normal daily activities.  Nausea: one incidence of nausea yesterday morning when he took his morning pills, be took one of his  antiemesis medications and it resolved No reported diarrhea, constipation, or rash  Other:  He reports some pain on the top of his right foot that has increased in pain over the past 2-3 weeks. The pain responds to acetaminophen. He knows to report any severe increase in pain and asked him to pay attention to whether the pain worsens with his treadmill walking   Oral Chemotherapy Adherence: no missed doses reported No patient barriers to medication adherence identified.   New medications: none reported   Medication Access Issues: no issues, fills at [facility]  Patient expressed understanding and was in agreement with this plan. He also understands that He can call clinic at any time with any questions, concerns, or complaints.   Thank you for allowing me to participate in the care of this very pleasant patient.   Time Total: 15 mins  Visit consisted of counseling and education on dealing with issues of symptom management in the setting of serious and potentially life-threatening illness.Greater than 50%  of this time was spent counseling and coordinating care related to the above assessment and plan.  Signed by: Alyson N. Leonard, PharmD, BCPS, BCOP, CPP Hematology/Oncology Clinical Pharmacist Practitioner [facility] Oral Chemotherapy [facility] [phone]  06/28/2021 9:38 AM      

## 2021-06-28 ENCOUNTER — Inpatient Hospital Stay: Payer: Medicare Other | Admitting: Pharmacist

## 2021-06-28 ENCOUNTER — Encounter: Payer: Self-pay | Admitting: Oncology

## 2021-06-28 ENCOUNTER — Inpatient Hospital Stay: Payer: Medicare Other

## 2021-06-28 VITALS — BP 137/86 | HR 73 | Temp 97.0°F | Resp 19

## 2021-06-28 DIAGNOSIS — Z5112 Encounter for antineoplastic immunotherapy: Secondary | ICD-10-CM | POA: Diagnosis not present

## 2021-06-28 DIAGNOSIS — N179 Acute kidney failure, unspecified: Secondary | ICD-10-CM

## 2021-06-28 DIAGNOSIS — C9000 Multiple myeloma not having achieved remission: Secondary | ICD-10-CM

## 2021-06-28 MED ORDER — DARATUMUMAB-HYALURONIDASE-FIHJ 1800-30000 MG-UT/15ML ~~LOC~~ SOLN
1800.0000 mg | Freq: Once | SUBCUTANEOUS | Status: AC
  Administered 2021-06-28: 1800 mg via SUBCUTANEOUS
  Filled 2021-06-28: qty 15

## 2021-06-28 MED ORDER — DIPHENHYDRAMINE HCL 25 MG PO CAPS
50.0000 mg | ORAL_CAPSULE | Freq: Once | ORAL | Status: AC
  Administered 2021-06-28: 50 mg via ORAL
  Filled 2021-06-28: qty 2

## 2021-06-28 MED ORDER — ACETAMINOPHEN 325 MG PO TABS: 650.0000 mg | ORAL_TABLET | Freq: Once | ORAL | Status: DC

## 2021-06-28 MED ORDER — MONTELUKAST SODIUM 10 MG PO TABS
10.0000 mg | ORAL_TABLET | Freq: Once | ORAL | Status: AC
  Administered 2021-06-28: 10 mg via ORAL
  Filled 2021-06-28: qty 1

## 2021-06-28 MED ORDER — DEXAMETHASONE 4 MG PO TABS
20.0000 mg | ORAL_TABLET | Freq: Once | ORAL | Status: AC
  Administered 2021-06-28: 20 mg via ORAL
  Filled 2021-06-28: qty 5

## 2021-06-28 MED ORDER — DENOSUMAB 120 MG/1.7ML ~~LOC~~ SOLN
120.0000 mg | Freq: Once | SUBCUTANEOUS | Status: AC
  Administered 2021-06-28: 120 mg via SUBCUTANEOUS
  Filled 2021-06-28: qty 1.7

## 2021-06-28 NOTE — Patient Instructions (Signed)
Daratumumab injection What is this medication? DARATUMUMAB (dar a toom ue mab) is a monoclonal antibody. It is used to treat multiple myeloma. This medicine may be used for other purposes; ask your health care provider or pharmacist if you have questions. COMMON BRAND NAME(S): DARZALEX What should I tell my care team before I take this medication? They need to know if you have any of these conditions: hereditary fructose intolerance infection (especially a virus infection such as chickenpox, herpes, or hepatitis B virus) lung or breathing disease (asthma, COPD) an unusual or allergic reaction to daratumumab, sorbitol, other medicines, foods, dyes, or preservatives pregnant or trying to get pregnant breast-feeding How should I use this medication? This medicine is for infusion into a vein. It is given by a health care professional in a hospital or clinic setting. Talk to your pediatrician regarding the use of this medicine in children. Special care may be needed. Overdosage: If you think you have taken too much of this medicine contact a poison control center or emergency room at once. NOTE: This medicine is only for you. Do not share this medicine with others. What if I miss a dose? Keep appointments for follow-up doses as directed. It is important not to miss your dose. Call your doctor or health care professional if you are unable to keep an appointment. What may interact with this medication? Interactions have not been studied. This list may not describe all possible interactions. Give your health care provider a list of all the medicines, herbs, non-prescription drugs, or dietary supplements you use. Also tell them if you smoke, drink alcohol, or use illegal drugs. Some items may interact with your medicine. What should I watch for while using this medication? Your condition will be monitored carefully while you are receiving this medicine. This medicine can cause serious allergic  reactions. To reduce your risk, your health care provider may give you other medicine to take before receiving this one. Be sure to follow the directions from your health care provider. This medicine can affect the results of blood tests to match your blood type. These changes can last for up to 6 months after the final dose. Your healthcare provider will do blood tests to match your blood type before you start treatment. Tell all of your healthcare providers that you are being treated with this medicine before receiving a blood transfusion. This medicine can affect the results of some tests used to determine treatment response; extra tests may be needed to evaluate response. Do not become pregnant while taking this medicine or for 3 months after stopping it. Women should inform their health care provider if they wish to become pregnant or think they might be pregnant. There is a potential for serious side effects to an unborn child. Talk to your health care provider for more information. Do not breast-feed an infant while taking this medicine. What side effects may I notice from receiving this medication? Side effects that you should report to your doctor or health care professional as soon as possible: allergic reactions (skin rash, itching, hives, swelling of the face, lips, or tongue) blurred vision infection (fever, chills, cough, sore throat, pain or difficulty passing urine) infusion reaction (dizziness, fast heartbeat, feeling faint or lightheaded, falls, headache, increase in blood pressure, nausea, vomiting, or wheezing or trouble breathing with loud or whistling sounds) unusual bleeding or bruising Side effects that usually do not require medical attention (report to your doctor or health care professional if they continue or are bothersome):  constipation diarrhea pain, tingling, numbness in the hands or feet swelling of the ankles, feet, hands tiredness This list may not describe all  possible side effects. Call your doctor for medical advice about side effects. You may report side effects to FDA at 1-800-FDA-1088. Where should I keep my medication? This drug is given in a hospital or clinic and will not be stored at home. NOTE: This sheet is a summary. It may not cover all possible information. If you have questions about this medicine, talk to your doctor, pharmacist, or health care provider.  2022 Elsevier/Gold Standard (2020-11-09 12:50:38)

## 2021-06-28 NOTE — Progress Notes (Signed)
Per pt he took tylenol 500 mg PO 30 minutes prior to his appointment today. MD notified, per MD to not give tylenol 650 mg PO at this time. Pt and treatment team updated. All questions answered at this time.  Per MD to monitor pt 30 minutes post Darzalex injection.   Pt observed for 30 minutes post Darzalex injection as ordered. No complications noted. VSS. Pt stable for discharge. RN educated pt on the importance of notifying the clinic if any complications occur at home, all questions answered at this time.   Luis Burke [employer]

## 2021-06-28 NOTE — Addendum Note (Signed)
Addended by: Earlie Server on: 06/28/2021 08:37 AM   Modules accepted: Orders

## 2021-06-29 LAB — MULTIPLE MYELOMA PANEL, SERUM
Albumin SerPl Elph-Mcnc: 4.3 g/dL (ref 2.9–4.4)
Albumin/Glob SerPl: 1.8 — ABNORMAL HIGH (ref 0.7–1.7)
Alpha 1: 0.2 g/dL (ref 0.0–0.4)
Alpha2 Glob SerPl Elph-Mcnc: 0.7 g/dL (ref 0.4–1.0)
B-Globulin SerPl Elph-Mcnc: 0.9 g/dL (ref 0.7–1.3)
Gamma Glob SerPl Elph-Mcnc: 0.6 g/dL (ref 0.4–1.8)
Globulin, Total: 2.4 g/dL (ref 2.2–3.9)
IgA: 61 mg/dL (ref 61–437)
IgG (Immunoglobin G), Serum: 785 mg/dL (ref 603–1613)
IgM (Immunoglobulin M), Srm: 15 mg/dL — ABNORMAL LOW (ref 20–172)
M Protein SerPl Elph-Mcnc: 0.1 g/dL — ABNORMAL HIGH
Total Protein ELP: 6.7 g/dL (ref 6.0–8.5)

## 2021-07-03 ENCOUNTER — Encounter: Payer: Self-pay | Admitting: Oncology

## 2021-07-03 ENCOUNTER — Inpatient Hospital Stay (HOSPITAL_BASED_OUTPATIENT_CLINIC_OR_DEPARTMENT_OTHER): Payer: Medicare Other | Admitting: Oncology

## 2021-07-03 ENCOUNTER — Inpatient Hospital Stay: Payer: Medicare Other

## 2021-07-03 ENCOUNTER — Other Ambulatory Visit: Payer: Self-pay

## 2021-07-03 ENCOUNTER — Other Ambulatory Visit: Payer: Medicare Other

## 2021-07-03 ENCOUNTER — Ambulatory Visit: Payer: Medicare Other | Admitting: Oncology

## 2021-07-03 VITALS — BP 152/88 | HR 63 | Temp 97.8°F | Resp 20 | Wt 205.1 lb

## 2021-07-03 DIAGNOSIS — D696 Thrombocytopenia, unspecified: Secondary | ICD-10-CM | POA: Diagnosis not present

## 2021-07-03 DIAGNOSIS — Z5111 Encounter for antineoplastic chemotherapy: Secondary | ICD-10-CM | POA: Diagnosis not present

## 2021-07-03 DIAGNOSIS — C9000 Multiple myeloma not having achieved remission: Secondary | ICD-10-CM

## 2021-07-03 DIAGNOSIS — C9002 Multiple myeloma in relapse: Secondary | ICD-10-CM

## 2021-07-03 DIAGNOSIS — N1831 Chronic kidney disease, stage 3a: Secondary | ICD-10-CM | POA: Diagnosis not present

## 2021-07-03 DIAGNOSIS — Z5112 Encounter for antineoplastic immunotherapy: Secondary | ICD-10-CM | POA: Diagnosis not present

## 2021-07-03 DIAGNOSIS — M899 Disorder of bone, unspecified: Secondary | ICD-10-CM

## 2021-07-03 LAB — CBC WITH DIFFERENTIAL/PLATELET
Abs Immature Granulocytes: 0.02 10*3/uL (ref 0.00–0.07)
Basophils Absolute: 0 10*3/uL (ref 0.0–0.1)
Basophils Relative: 1 %
Eosinophils Absolute: 0 10*3/uL (ref 0.0–0.5)
Eosinophils Relative: 1 %
HCT: 37.5 % — ABNORMAL LOW (ref 39.0–52.0)
Hemoglobin: 12.7 g/dL — ABNORMAL LOW (ref 13.0–17.0)
Immature Granulocytes: 1 %
Lymphocytes Relative: 32 %
Lymphs Abs: 1 10*3/uL (ref 0.7–4.0)
MCH: 34.1 pg — ABNORMAL HIGH (ref 26.0–34.0)
MCHC: 33.9 g/dL (ref 30.0–36.0)
MCV: 100.8 fL — ABNORMAL HIGH (ref 80.0–100.0)
Monocytes Absolute: 0.3 10*3/uL (ref 0.1–1.0)
Monocytes Relative: 11 %
Neutro Abs: 1.7 10*3/uL (ref 1.7–7.7)
Neutrophils Relative %: 54 %
Platelets: 96 10*3/uL — ABNORMAL LOW (ref 150–400)
RBC: 3.72 MIL/uL — ABNORMAL LOW (ref 4.22–5.81)
RDW: 13.9 % (ref 11.5–15.5)
WBC: 3.1 10*3/uL — ABNORMAL LOW (ref 4.0–10.5)
nRBC: 0 % (ref 0.0–0.2)

## 2021-07-03 LAB — COMPREHENSIVE METABOLIC PANEL
ALT: 25 U/L (ref 0–44)
AST: 18 U/L (ref 15–41)
Albumin: 4.2 g/dL (ref 3.5–5.0)
Alkaline Phosphatase: 59 U/L (ref 38–126)
Anion gap: 8 (ref 5–15)
BUN: 22 mg/dL (ref 8–23)
CO2: 29 mmol/L (ref 22–32)
Calcium: 9.9 mg/dL (ref 8.9–10.3)
Chloride: 100 mmol/L (ref 98–111)
Creatinine, Ser: 1.5 mg/dL — ABNORMAL HIGH (ref 0.61–1.24)
GFR, Estimated: 51 mL/min — ABNORMAL LOW (ref >60)
Glucose, Bld: 99 mg/dL (ref 70–99)
Potassium: 4.8 mmol/L (ref 3.5–5.1)
Sodium: 137 mmol/L (ref 135–145)
Total Bilirubin: 0.8 mg/dL (ref 0.3–1.2)
Total Protein: 7.1 g/dL (ref 6.5–8.1)

## 2021-07-03 LAB — PRETREATMENT RBC PHENOTYPE: DAT, IgG: NEGATIVE

## 2021-07-03 NOTE — Progress Notes (Signed)
Patient states he is having some right foot pain on the top. Patient pain worsens when he is barefoot. Patient stated it started sometime last week but has got worst since then. Today pain level is a 6. Patient states he thought it was side effects from chemo but still is considered.

## 2021-07-03 NOTE — Progress Notes (Signed)
Hematology/Oncology  Follow up note [facility] [phone] Fax:([phone]) [phone]   Patient Care Team: Tonia Ghent, MD as PCP - General (Family Medicine) Earlie Server, MD as Consulting Physician (Oncology) Beather Arbour Lilyan Punt, MD as Referring Physician (Hematology and Oncology) Diannia Ruder, MD as Referring Physician (Hematology and Oncology)  REASON FOR VISIT Follow up for multiple myeloma.   HISTORY OF PRESENTING ILLNESS:  [facility] is a  65 y.o.  male with PMH listed below presents for follow up of multiple myeloma.  # 07/27/2018 multiple myeloma panel showed M protein of 0.1, IgG 662, IgA 49, IgM 7. Patient was called back to further labs done. 08/10/2018, free light chain ratio showed extremely high level of kappa free light chain 10,183, with a kappa lambda light chain ratio of 1414.31 LDH 164 Beta-2 microglobulin 5 Patient was called and discuss about results.  He was recommended to undergo bone marrow biopsy and PET scan. 08/19/2018 bone marrow biopsy showed hypercellular marrow 80%, involved by plasma cell neoplasm up to 95%.  Consistent with plasma cell myeloma. Myeloma FISH  gain of gain of CEP12  09/10/2018 skeletal survey showed questionable small lucent lesions within the midshaft of the humerus bilaterally.  Otherwise no suspicious focal lytic lesion or acute bone abnormality. 08/25/2018 PET scan showed no definite hypermetabolic bone disease but CT findings are highly suspicious for numerous small myelomatous lesions involving the spine, sternum and scattered ribs.  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 He received preparative regimen with melphalan 200 mg/m on 04/22/2019 followed by autologous stem cell infusion on 04/23/2019. Currently he is transfusion independent.  Transfusion criteria with as needed hemoglobin less than 7.5 for platelet less than 10,000.  Transplant course was complicated with  febrile neutropenia grade 3, treated with vancomycin and cephapirin until engraftment on 05/06/2019. Patient also had engraftment syndrome and had to be started on Solu-Medrol 25 mg twice daily on 05/05/2019.  Transition to Medrol Dosepak on 05/07/2019 to complete steroid taper as outpatient.  Patient is currently on acyclovir for 1 year after transplant, dose was reduced on 722 11/20/2018 due to renal function. Patient had a baseline CKD with creatinine running between 1.4-1.6.  He had acute on chronic kidney injury and creatinine went up to 2.2 on 722.  Fluid hydration was given and creatinine decreased to baseline 1.5-1.7. His Hickman catheter was removed.  Candida Cruris treated with topical clotrimazole 1% 3 times daily and to resolution..  #  seen by Dr. Tonia Brooms BMT team on 06/30/2019.revaccination at [facility] begins at 12 months post transplant I discussed with [facility] hematology Dr.Choi and he recommends plans as listed below.  -patient to be started on Ixazomib maintenance: -Ixazomib 77m on D1/D8/D15 out of 28 day cycle.  -patient will start re-vaccination 12 months post transplant.  -Post transplant bone marrow biopsy if clinically indicated.  # seen by [facility] bone marrow transplant team in June 2021.  Patient has been started on immunization protocol.  He reports no new complaints.  #07[facility]12[facility]2020 [facility]  Lucent lesion in the L4 spinous process measuring 4 mm which is  nonspecific. Consider confirmation of marrow replacing process with MRI.  2.  Incompletely characterized renal cysts   # He continues to have chronic back pain and neck pain.  [facility] MRI lumbar spine with and without contrast showed no evidence of spinal metastasis.  Lumbar spondylosis is most pronounced  at L5-S1 where there is severe right and moderate left neural foraminal stenosis. 08/11/2020, x-ray cervical spine complete with flexion and extension showed The cervical spine  is visualized from C1 to the top of T1 on the lateral  view.  Reversal of the normal cervical lordosis with a focal kyphosis centered at the C5-C6 intervertebral level. Trace anterolisthesis of C4 onto C5. Trace retrolisthesis of C6 on C7. No evidence of dynamic instability is noted on  the flexion or extension views.   No prevertebral soft tissue swelling. Cervical vertebral body heights are maintained. Multilevel degenerative changes of the cervical spine including discogenic disease, most pronounced at C5-C6 and C6-C7, and facet arthropathy most pronounced at C4-C5. Multilevel mild to moderate left neural foraminal osseous narrowing of the mid to lower cervical vertebrae, possibly centrally/artifactual by patient positioning.  #07/28/2020 bone marrow normocellular marrow with polytypic plasmacytosis, 3 to 8%   Patient is in remission.  Cytogenetics negative for trisomy 12.  Negative myeloma FISH panel.   # Lumbar lesion,  11/13/20 [facility]  MRI cervical sine w/wo contrast - no evidence of metastatic disease C3-4 severe spinal stenosis, left neural foraminal narrowing. C4-5 spinal canal stenosis. [facility] MRI results showed no metastatic spine lesion. Chronic back pain and neck pain, images are consistent with chronic degenerative disease.  Patient continues to follow-up with [facility] orthopedic surgeon.  04/03/2021, light chain ratio increased to 3.76.   05/15/2021 bone marrow biopsy showed slightly hypercellular bone marrow for age with slight plasmacytosis.  5% plasma cells with kappa light chain restriction.  Cytogenetics normal.  Myeloma FISH negative # 05/15/2021 bone marrow biopsy showed slightly hypercellular bone marrow for age with slight plasmacytosis.  5% plasma cells with kappa light chain restriction.  Cytogenetics normal.  Myeloma FISH negative  # 05/31/2021 Daratumumab -Pomalyst-dexamethasone-D1 patient had grade 2 infusion reaction to daratumumab and received steroids, Benadryl, famotidine.  He was  able to finish treatment.D8 was able to finish Daratumumab with no infusion reactions.  INTERVAL HISTORY AZARIEL BANIK is a 65 y.o. male who has above history reviewed by me today for follow-up  Currently on Daratumumab Pomalyst dexamethasone Patient tolerates well. Developed right dorsal foot pain, no swelling, no recent injury Denies any allergic reaction to daratumumab. Patient denies any nausea vomiting diarrhea.  Chronic back pain unchanged.   Review of Systems  Constitutional:  Negative for appetite change, chills, fatigue, fever and unexpected weight change.  HENT:   Negative for hearing loss and voice change.   Eyes:  Negative for eye problems and icterus.  Respiratory:  Negative for chest tightness, cough and shortness of breath.   Cardiovascular:  Negative for chest pain and leg swelling.  Gastrointestinal:  Negative for abdominal distention and abdominal pain.  Endocrine: Negative for hot flashes.  Genitourinary:  Negative for difficulty urinating, dysuria and frequency.   Musculoskeletal:  Positive for back pain. Negative for arthralgias and neck pain.       Right foot pain  Skin:  Negative for itching and rash.  Neurological:  Negative for light-headedness and numbness.  Hematological:  Negative for adenopathy. Does not bruise/bleed easily.  Psychiatric/Behavioral:  Negative for confusion.    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   Fatty liver    on u/s 07/2018   Hyperlipidemia 03/2004   Hypertension 1994  Multiple myeloma (HCC)    Snores     SURGICAL HISTORY: Past Surgical History:  Procedure Laterality Date   CATARACT EXTRACTION W/PHACO Right 03/04/2018   Procedure: CATARACT EXTRACTION PHACO AND INTRAOCULAR LENS PLACEMENT ([place])  RIGHT TORIC;  Surgeon: Leandrew Koyanagi, MD;   Location: [facility];  Service: Ophthalmology;  Laterality: Right;  [place] no Toric Lens 1:45 5.3   COLONOSCOPY WITH PROPOFOL N/A 07/20/2018   Procedure: COLONOSCOPY WITH PROPOFOL;  Surgeon: Jonathon Bellows, MD;  Location: [facility] ENDOSCOPY;  Service: Gastroenterology;  Laterality: N/A;   ESOPHAGOGASTRODUODENOSCOPY (EGD) WITH PROPOFOL N/A 07/20/2018   Procedure: ESOPHAGOGASTRODUODENOSCOPY (EGD) WITH PROPOFOL;  Surgeon: Jonathon Bellows, MD;  Location[facility] END[facility]COPY;  Service: Gastroenterology;  Laterality: N/A;   EYE SURGERY Right    HERNIA REPAIR     L Lap herniorraphy  04/2000   Left wrist ganglionectomy      SOCIAL HISTORY: Social History   Socioeconomic History   Marital status: Single    Spouse name: Not on file   Number of children: 1   Years of education: Not on file   Highest education level: Not on file  Occupational History   Occupation: [employer]  Tobacco Use   Smoking status: Never   Smokeless tobacco: Never   Tobacco comments:    Occassionally  Vaping Use   Vaping Use: Never used  Substance and Sexual Activity   Alcohol use: Not Currently    Alcohol/week: 3.0 standard drinks    Types: 3 Cans of beer per week   Drug use: No   Sexual activity: Not on file  Other Topics Concern   Not on file  Social History Narrative   Divorced 12/23/2007, dating as of 12/22/2017   His daughter died 4 days after giving birth to the patient's granddaughter   Has joint custody of his dead daughter's child   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 Strain: Not on [employer] Insecurity: Not on file  Transportation Needs: Not on file  Physical Activity: Not on file  Stress: Not on file  Social Connections: Not on file  Intimate Partner Violence: Not on file    FAMILY HISTORY: Family History  Problem Relation Age of Onset   Hypertension Mother    Diabetes Mother    Heart disease Mother        CAD   Dementia Mother    Hypertension  Father    Heart disease Father        MI 02/03   Colon cancer Father    Hypertension Sister    Hypertension Sister    Hypertension Sister    Prostate cancer Neg Hx     ALLERGIES:  is allergic to bactrim [sulfamethoxazole-trimethoprim], clonidine derivatives, and tadalafil.  MEDICATIONS:  Current Outpatient Medications  Medication Sig Dispense Refill   acetaminophen (TYLENOL) 325 MG tablet Take 2 tablets (650 mg total) by mouth See admin instructions. 1 hour prior to chemotherapy treatments 30 tablet 0   amLODipine (NORVASC) 5 MG tablet TAKE 2 TABLETS BY MOUTH EVERY DAY 180 tablet 1   ASPIRIN 81 PO Take 81 mg by mouth daily.     B Complex Vitamins (VITAMIN B COMPLEX PO) SMARTSIG:By Mouth     chlorhexidine (PERIDEX) 0.12 % solution Use as directed 15 mLs in the mouth or throat 2 (two) times daily. 473 mL 1   clopidogrel (PLAVIX) 75 MG tablet Take by mouth.     co-enzyme  Q-10 50 MG capsule Take 50 mg by mouth daily.     [employer] VITAMIN B12 1000 MCG tablet TAKE 1 TABLET BY MOUTH EVERY DAY 90 tablet 3   diphenhydrAMINE (BENADRYL) 50 MG tablet Take 1 tablet (50 mg total) by mouth See admin instructions. Take 1 tablet 1 hour prior to chemotherapy treatments. 30 tablet 0   hydrALAZINE (APRESOLINE) 10 MG tablet TAKE 1 TABLET (10 MG TOTAL) BY MOUTH 3 (THREE) TIMES DAILY. 270 tablet 1   lisinopril (ZESTRIL) 20 MG tablet Take 1 tablet (20 mg total) by mouth daily. Please keep appt on 07/05/21 90 tablet 0   LORazepam (ATIVAN) 1 MG tablet TAKE 1 TABLET (1 MG TOTAL) BY MOUTH EVERY 6 (SIX) HOURS AS NEEDED FOR ANXIETY (FOR NAUSEA)     magnesium chloride (SLOW-MAG) 64 MG TBEC SR tablet Take 1 tablet (64 mg total) by mouth daily. 60 tablet 0   metoprolol succinate (TOPROL-XL) 50 MG 24 hr tablet TAKE 4 TABLETS (200 MG TOTAL) BY MOUTH DAILY. TAKE WITH OR IMMEDIATELY FOLLOWING A MEAL. 360 tablet 2   montelukast (SINGULAIR) 10 MG tablet Take 1 tablet (10 mg total) by mouth See admin instructions. Take 1 hour prior  to chemotherapy 30 tablet 0   ondansetron (ZOFRAN) 8 MG tablet Take 1 tablet (8 mg total) by mouth every 8 (eight) hours as needed. for nausea 30 tablet 1   pravastatin (PRAVACHOL) 10 MG tablet TAKE 1 TABLET BY MOUTH EVERY DAY 90 tablet 3   zinc gluconate 50 MG tablet Take 50 mg by mouth daily.     acyclovir (ZOVIRAX) 400 MG tablet Take 400 mg by mouth 2 (two) times daily. (Patient not taking: No sig reported)     cyclobenzaprine (FLEXERIL) 5 MG tablet Take 1 tablet (5 mg total) by mouth 3 (three) times daily as needed for muscle spasms. (Patient not taking: No sig reported) 30 tablet 0   dexamethasone (DECADRON) 4 MG tablet Take 5 tablets (20 mg total) by mouth once a week. Take at least 1 hour prior to infusion appt. Take with food. To start on 06/07/21. (Patient not taking: No sig reported) 20 tablet 1   loperamide (IMODIUM) 2 MG capsule Take 1 capsule (2 mg total) by mouth See admin instructions. Take 2 capsules at the onset of diarrhea, then 1 capsule every 2 hours or after every loose bowel movement. Maximum 8 capsules per 24 hours. (Patient not taking: No sig reported) 60 capsule 0   pomalidomide (POMALYST) 2 MG capsule Take 1 capsule (2 mg total) by mouth daily. Take for 21 days on, then hold for 7 days. Repeat every 28 days. (Patient not taking: No sig reported) 21 capsule 0   prochlorperazine (COMPAZINE) 10 MG tablet TAKE 1 TABLET (10 MG TOTAL) BY MOUTH EVERY 6 (SIX) HOURS AS NEEDED FOR NAUSEA (Patient not taking: No sig reported)     No current facility-administered medications for this visit.     PHYSICAL EXAMINATION: ECOG PERFORMANCE STATUS: 1 - Symptomatic but completely ambulatory Vitals:   07/03/21 1137  BP: (!) 152/88  Pulse: 63  Resp: 20  Temp: 97.8 F (36.6 C)  SpO2: 99%   Filed Weights   07/03/21 1137  Weight: 205 lb 1.6 oz (93 kg)    Physical Exam Constitutional:      General: He is not in acute distress. HENT:     Head: Normocephalic and atraumatic.  Eyes:      General: No scleral icterus. Cardiovascular:  Rate and Rhythm: Normal rate and regular rhythm.     Heart sounds: Normal heart sounds.  Pulmonary:     Effort: Pulmonary effort is normal. No respiratory distress.     Breath sounds: No wheezing.  Abdominal:     General: Bowel sounds are normal. There is no distension.     Palpations: Abdomen is soft.  Musculoskeletal:        General: No deformity. Normal range of motion.     Cervical back: Normal range of motion and neck supple.  Skin:    General: Skin is warm and dry.     Findings: No erythema or rash.  Neurological:     Mental Status: He is alert and oriented to person, place, and time. Mental status is at baseline.     Cranial Nerves: No cranial nerve deficit.     Coordination: Coordination normal.  Psychiatric:        Mood and Affect: Mood normal.     LABORATORY DATA:  I have reviewed the data as listed Lab Results  Component Value Date   WBC 3.1 (L) 07/03/2021   HGB 12.7 (L) 07/03/2021   HCT 37.5 (L) 07/03/2021   MCV 100.8 (H) 07/03/2021   PLT 96 (L) 07/03/2021   Recent Labs    06/19/21 1449 06/26/21 0857 07/03/21 1128  NA 135 134* 137  K 4.1 4.2 4.8  CL 101 100 100  CO2 $Re'25 26 29  'Oaq$ GLUCOSE 94 105* 99  BUN 22 24* 22  CREATININE 1.12 1.33* 1.50*  CALCIUM 9.3 9.5 9.9  GFRNONAA >60 59* 51*  PROT 7.1 7.5 7.1  ALBUMIN 4.3 4.6 4.2  AST $Re'15 17 18  'HoT$ ALT 40 26 25  ALKPHOS 64 65 59  BILITOT 0.5 0.8 0.8    Iron/TIBC/Ferritin/ %Sat    Component Value Date/Time   IRON 74 07/01/2018 0944   TIBC 250 07/01/2018 0944   FERRITIN 357 07/01/2018 0944   IRONPCTSAT 30 07/01/2018 0944    Lab Results  Component Value Date   TOTALPROTELP 6.7 06/26/2021   ALBUMINELP 4.0 03/08/2021   A1GS 0.2 03/08/2021   A2GS 0.7 03/08/2021   BETS 1.1 03/08/2021   GAMS 1.1 03/08/2021   MSPIKE Not Observed 03/08/2021   SPEI Comment 03/08/2021   Lab Results  Component Value Date   KPAFRELGTCHN 28.5 (H) 06/26/2021   LAMBDASER 10.0  06/26/2021   KAPLAMBRATIO 2.85 (H) 06/26/2021    RADIOGRAPHIC STUDIES: I have personally reviewed the radiological images as listed and agreed with the findings in the report. MR Lumbar Spine W Wo Contrast  Result Date: 06/19/2021 CLINICAL DATA:  Back pain, multiple myeloma EXAM: MRI LUMBAR SPINE WITHOUT AND WITH CONTRAST TECHNIQUE: Multiplanar and multiecho pulse sequences of the lumbar spine were obtained without and with intravenous contrast. CONTRAST:  66mL GADAVIST GADOBUTROL 1 MMOL/ML IV SOLN COMPARISON:  None. FINDINGS: Segmentation:  Standard. Alignment:  No significant listhesis. Vertebrae: Vertebral body heights are maintained. No substantial marrow edema. No suspicious osseous lesion. Conus medullaris and cauda equina: Conus extends to the T12-L1 level. Conus and cauda equina appear normal. No abnormal intrathecal enhancement. Paraspinal and other soft tissues: Probable bilateral renal cysts partially imaged. Disc levels: L1-L2:  No canal or foraminal stenosis. L2-L3:  No canal or foraminal stenosis. L3-L4:  Disc bulge.  No canal or foraminal stenosis. L4-L5: Disc bulge. Central and left subarticular annular fissure. No canal or foraminal stenosis. L5-S1: Disc bulge with endplate osteophytic ridging. Facet arthropathy. No canal stenosis. Moderate right and  mild left foraminal stenosis. Potential exiting right L5 nerve root compression. IMPRESSION: No evidence of myelomatous involvement. No compression fracture. Mild lower lumbar degenerative changes. There is right foraminal narrowing at L5-S1 with potential exiting nerve root compression. Electronically Signed   By: Macy Mis M.D.   On: 06/19/2021 12:20   CT BONE MARROW BIOPSY & ASPIRATION  Result Date: 05/15/2021 CLINICAL DATA:  History of multiple myeloma with possible relapse due to increased light chains. Need for bone marrow biopsy for further evaluation. EXAM: CT GUIDED BONE MARROW ASPIRATION AND BIOPSY ANESTHESIA/SEDATION: Versed 2.0  mg IV, Fentanyl 100 mcg IV Total Moderate Sedation Time:   13 minutes. The patient's level of consciousness and physiologic status were continuously monitored during the procedure by Radiology nursing. PROCEDURE: The procedure risks, benefits, and alternatives were explained to the patient. Questions regarding the procedure were encouraged and answered. The patient understands and consents to the procedure. A time out was performed prior to initiating the procedure. The right gluteal region was prepped with chlorhexidine. Sterile gown and sterile gloves were used for the procedure. Local anesthesia was provided with 1% Lidocaine. Under CT guidance, an 11 gauge On Control bone cutting needle was advanced from a posterior approach into the right iliac bone. Needle positioning was confirmed with CT. Initial non heparinized and heparinized aspirate samples were obtained of bone marrow. Core biopsy was performed via the On Control drill needle. Two separate core biopsy samples were obtained. COMPLICATIONS: None FINDINGS: Inspection of initial aspirate did reveal visible particles. Intact core biopsy samples were obtained. IMPRESSION: CT guided bone marrow biopsy of right posterior iliac bone with both aspirate and core samples obtained. Electronically Signed   By: Aletta Edouard M.D.   On: 05/15/2021 10:51      ASSESSMENT & PLAN:  1. Encounter for antineoplastic chemotherapy   2. Multiple myeloma in relapse ([place])   3. Thrombocytopenia (HCC)   4. Stage 3a chronic kidney disease ([place])   5. Bone lesion    #Light chain multiple myeloma beta 2 microglobulin 5 and normal albumin.  Stage II.cytogenetics showed normal male chromosome, MDS FISH panel showed trisomy 76- Standard Risk.S/p RVD x 9 and  [facility] Early relapse multiple myeloma. Labs are reviewed and discussed with patient. Proceed with cycle 2 D8 subQ daratumumab/ dex $RemoveBeforeDE'20mg'[id number]$ . On 07/05/21 9/29 D15  Daratumumab + Dex Continue Pomalyst 2 mg.  Plan 3 weeks on 1 week off.  CBC will be monitored weekly.  # Thrombocytopenia, monitor closely.  Platelet count 96,000. # CKD, avoid nephrotoxin.  Creatinine stable.   #Back pain, MRI lumbar spine showed no suspicious myeloma lesions  Post bone marrow transplant care He has completed 1 year of acyclovir.  Off acyclovir now. Post transplant vaccination finished at [facility].   #Bone health/osteopenia/multiple myeloma-  Continue Xgeva monthly.- next 07/28/21 Continue calcium and vitamin D supplementation. Follow up 1 week for  Daratumumab treatment. 2 weeks. Lab md Daratumumab  Earlie Server, MD, PhD Hematology Oncology [place] at [facility] 07/03/2021

## 2021-07-05 ENCOUNTER — Inpatient Hospital Stay: Payer: Medicare Other

## 2021-07-05 ENCOUNTER — Ambulatory Visit: Payer: BC Managed Care – PPO | Admitting: Family Medicine

## 2021-07-05 VITALS — BP 132/84 | HR 60 | Temp 97.2°F | Resp 18 | Wt 203.0 lb

## 2021-07-05 DIAGNOSIS — Z5112 Encounter for antineoplastic immunotherapy: Secondary | ICD-10-CM | POA: Diagnosis not present

## 2021-07-05 DIAGNOSIS — C9000 Multiple myeloma not having achieved remission: Secondary | ICD-10-CM

## 2021-07-05 MED ORDER — DIPHENHYDRAMINE HCL 25 MG PO CAPS
ORAL_CAPSULE | ORAL | Status: AC
  Filled 2021-07-05: qty 1

## 2021-07-05 MED ORDER — DARATUMUMAB-HYALURONIDASE-FIHJ 1800-30000 MG-UT/15ML ~~LOC~~ SOLN
1800.0000 mg | Freq: Once | SUBCUTANEOUS | Status: AC
  Administered 2021-07-05: 1800 mg via SUBCUTANEOUS
  Filled 2021-07-05: qty 15

## 2021-07-05 MED ORDER — MONTELUKAST SODIUM 10 MG PO TABS
10.0000 mg | ORAL_TABLET | Freq: Once | ORAL | Status: AC
  Administered 2021-07-05: 10 mg via ORAL
  Filled 2021-07-05: qty 1

## 2021-07-05 MED ORDER — ACETAMINOPHEN 325 MG PO TABS
650.0000 mg | ORAL_TABLET | Freq: Once | ORAL | Status: AC
  Administered 2021-07-05: 650 mg via ORAL
  Filled 2021-07-05: qty 2

## 2021-07-05 MED ORDER — DEXAMETHASONE 4 MG PO TABS
20.0000 mg | ORAL_TABLET | Freq: Once | ORAL | Status: AC
  Administered 2021-07-05: 20 mg via ORAL
  Filled 2021-07-05: qty 5

## 2021-07-05 MED ORDER — DIPHENHYDRAMINE HCL 25 MG PO CAPS
50.0000 mg | ORAL_CAPSULE | Freq: Once | ORAL | Status: AC
  Administered 2021-07-05: 50 mg via ORAL
  Filled 2021-07-05: qty 2

## 2021-07-05 NOTE — Patient Instructions (Signed)
[facility] ONCOLOGY  Discharge Instructions: Thank you for choosing [facility] to provide your oncology and hematology care.  If you have a lab appointment with the [facility], please go directly to the [facility] and check in at the registration area.  Wear comfortable clothing and clothing appropriate for easy access to any Portacath or PICC line.   We strive to give you quality time with your provider. You may need to reschedule your appointment if you arrive late (15 or more minutes).  Arriving late affects you and other patients whose appointments are after yours.  Also, if you miss three or more appointments without notifying the office, you may be dismissed from the clinic at the provider's discretion.      For prescription refill requests, have your pharmacy contact our office and allow 72 hours for refills to be completed.    Today you received the following chemotherapy and/or immunotherapy agents daratumumab-hyaluronidase-fihj    To help prevent nausea and vomiting after your treatment, we encourage you to take your nausea medication as directed.  BELOW ARE SYMPTOMS THAT SHOULD BE REPORTED IMMEDIATELY: *FEVER GREATER THAN 100.4 F (38 C) OR HIGHER *CHILLS OR SWEATING *NAUSEA AND VOMITING THAT IS NOT CONTROLLED WITH YOUR NAUSEA MEDICATION *UNUSUAL SHORTNESS OF BREATH *UNUSUAL BRUISING OR BLEEDING *URINARY PROBLEMS (pain or burning when urinating, or frequent urination) *BOWEL PROBLEMS (unusual diarrhea, constipation, pain near the anus) TENDERNESS IN MOUTH AND THROAT WITH OR WITHOUT PRESENCE OF ULCERS (sore throat, sores in mouth, or a toothache) UNUSUAL RASH, SWELLING OR PAIN  UNUSUAL VAGINAL DISCHARGE OR ITCHING   Items with * indicate a potential emergency and should be followed up as soon as possible or go to the Emergency Department if any problems should occur.  Please show the CHEMOTHERAPY ALERT CARD or IMMUNOTHERAPY ALERT  CARD at check-in to the Emergency Department and triage nurse.  Should you have questions after your visit or need to cancel or reschedule your appointment, please contact [facility]  [phone] and follow the prompts.  Office hours are 8:00 a.m. to 4:30 p.m. Monday - Friday. Please note that voicemails left after 4:00 p.m. may not be returned until the following business day.  We are closed weekends and major holidays. You have access to a nurse at all times for urgent questions. Please call the main number to the clinic [phone] and follow the prompts.  For any non-urgent questions, you may also contact your provider using MyChart. We now offer e-Visits for anyone 47 and older to request care online for non-urgent symptoms. For details visit [email or web address].   Also download the MyChart app! Go to the app store, search "MyChart", open the app, select [facility], and log in with your MyChart username and password.  Due to Covid, a mask is required upon entering the hospital/clinic. If you do not have a mask, one will be given to you upon arrival. For doctor visits, patients may have 1 support person aged 21 or older with them. For treatment visits, patients cannot have anyone with them due to current Covid guidelines and our immunocompromised population.

## 2021-07-05 NOTE — Progress Notes (Signed)
Patient tolerated Dada chemo injection well today, pre-meds given. No concerns voiced. Patient monitored 30 min post transfusion. discharged. Stable.

## 2021-07-07 ENCOUNTER — Other Ambulatory Visit: Payer: Self-pay | Admitting: Oncology

## 2021-07-08 ENCOUNTER — Other Ambulatory Visit: Payer: Self-pay | Admitting: Oncology

## 2021-07-08 MED ORDER — MONTELUKAST SODIUM 10 MG PO TABS
10.0000 mg | ORAL_TABLET | ORAL | 1 refills | Status: DC
Start: 2021-07-08 — End: 2023-06-12

## 2021-07-09 ENCOUNTER — Other Ambulatory Visit: Payer: Self-pay | Admitting: *Deleted

## 2021-07-09 NOTE — Telephone Encounter (Signed)
Duplicate request

## 2021-07-11 ENCOUNTER — Encounter: Payer: Self-pay | Admitting: Oncology

## 2021-07-12 ENCOUNTER — Other Ambulatory Visit: Payer: Self-pay | Admitting: Oncology

## 2021-07-12 ENCOUNTER — Inpatient Hospital Stay: Payer: Medicare Other

## 2021-07-12 VITALS — BP 134/80 | HR 65 | Temp 96.9°F | Resp 18 | Wt 204.0 lb

## 2021-07-12 DIAGNOSIS — C9000 Multiple myeloma not having achieved remission: Secondary | ICD-10-CM

## 2021-07-12 DIAGNOSIS — Z5112 Encounter for antineoplastic immunotherapy: Secondary | ICD-10-CM | POA: Diagnosis not present

## 2021-07-12 LAB — CBC WITH DIFFERENTIAL/PLATELET
Abs Immature Granulocytes: 0.02 10*3/uL (ref 0.00–0.07)
Basophils Absolute: 0 10*3/uL (ref 0.0–0.1)
Basophils Relative: 0 %
Eosinophils Absolute: 0.2 10*3/uL (ref 0.0–0.5)
Eosinophils Relative: 3 %
HCT: 39.5 % (ref 39.0–52.0)
Hemoglobin: 13.2 g/dL (ref 13.0–17.0)
Immature Granulocytes: 0 %
Lymphocytes Relative: 26 %
Lymphs Abs: 1.2 10*3/uL (ref 0.7–4.0)
MCH: 34.4 pg — ABNORMAL HIGH (ref 26.0–34.0)
MCHC: 33.4 g/dL (ref 30.0–36.0)
MCV: 102.9 fL — ABNORMAL HIGH (ref 80.0–100.0)
Monocytes Absolute: 1 10*3/uL (ref 0.1–1.0)
Monocytes Relative: 21 %
Neutro Abs: 2.2 10*3/uL (ref 1.7–7.7)
Neutrophils Relative %: 50 %
Platelets: 67 10*3/uL — ABNORMAL LOW (ref 150–400)
RBC: 3.84 MIL/uL — ABNORMAL LOW (ref 4.22–5.81)
RDW: 14.1 % (ref 11.5–15.5)
WBC: 4.5 10*3/uL (ref 4.0–10.5)
nRBC: 0 % (ref 0.0–0.2)

## 2021-07-12 LAB — COMPREHENSIVE METABOLIC PANEL
ALT: 62 U/L — ABNORMAL HIGH (ref 0–44)
AST: 18 U/L (ref 15–41)
Albumin: 4.5 g/dL (ref 3.5–5.0)
Alkaline Phosphatase: 76 U/L (ref 38–126)
Anion gap: 9 (ref 5–15)
BUN: 20 mg/dL (ref 8–23)
CO2: 29 mmol/L (ref 22–32)
Calcium: 10.1 mg/dL (ref 8.9–10.3)
Chloride: 99 mmol/L (ref 98–111)
Creatinine, Ser: 1.36 mg/dL — ABNORMAL HIGH (ref 0.61–1.24)
GFR, Estimated: 58 mL/min — ABNORMAL LOW (ref >60)
Glucose, Bld: 96 mg/dL (ref 70–99)
Potassium: 4.6 mmol/L (ref 3.5–5.1)
Sodium: 137 mmol/L (ref 135–145)
Total Bilirubin: 0.9 mg/dL (ref 0.3–1.2)
Total Protein: 7 g/dL (ref 6.5–8.1)

## 2021-07-12 MED ORDER — MONTELUKAST SODIUM 10 MG PO TABS
10.0000 mg | ORAL_TABLET | Freq: Once | ORAL | Status: AC
  Administered 2021-07-12: 10 mg via ORAL
  Filled 2021-07-12: qty 1

## 2021-07-12 MED ORDER — SODIUM CHLORIDE 0.9 % IV SOLN
Freq: Once | INTRAVENOUS | Status: DC
  Filled 2021-07-12: qty 250

## 2021-07-12 MED ORDER — DEXAMETHASONE 4 MG PO TABS
20.0000 mg | ORAL_TABLET | Freq: Once | ORAL | Status: AC
  Administered 2021-07-12: 20 mg via ORAL

## 2021-07-12 MED ORDER — DARATUMUMAB-HYALURONIDASE-FIHJ 1800-30000 MG-UT/15ML ~~LOC~~ SOLN
1800.0000 mg | Freq: Once | SUBCUTANEOUS | Status: AC
  Administered 2021-07-12: 1800 mg via SUBCUTANEOUS
  Filled 2021-07-12: qty 15

## 2021-07-12 MED ORDER — DIPHENHYDRAMINE HCL 25 MG PO CAPS
50.0000 mg | ORAL_CAPSULE | Freq: Once | ORAL | Status: AC
  Administered 2021-07-12: 50 mg via ORAL
  Filled 2021-07-12: qty 2

## 2021-07-12 MED ORDER — ACETAMINOPHEN 325 MG PO TABS
650.0000 mg | ORAL_TABLET | Freq: Once | ORAL | Status: AC
  Administered 2021-07-12: 650 mg via ORAL
  Filled 2021-07-12: qty 2

## 2021-07-12 NOTE — Progress Notes (Signed)
MD made aware of ALT elevation. Pt reports "shooting pain" on left Abdomen near previous injection site that has been intermittent since last treatment. Per Dr. Tasia Catchings proceed with today's treatment and pt to place warm compress on abdomen when pain occurs. Pt verbalizes understanding.   1207: Pt and VS stable, no s/s of distress noted. Pt tolerated injection well and monitored 30 minute post injection per Dr. Tasia Catchings. Injection site WNL, no redness, swelling or redness noted. Pt stable at discharge.

## 2021-07-12 NOTE — Patient Instructions (Signed)
[facility]  Discharge Instructions: Thank you for choosing [facility] to provide your oncology and hematology care.  If you have a lab appointment with the [facility], please go directly to the [facility] and check in at the registration area.  Wear comfortable clothing and clothing appropriate for easy access to any Portacath or PICC line.   We strive to give you quality time with your provider. You may need to reschedule your appointment if you arrive late (15 or more minutes).  Arriving late affects you and other patients whose appointments are after yours.  Also, if you miss three or more appointments without notifying the office, you may be dismissed from the clinic at the provider's discretion.      For prescription refill requests, have your pharmacy contact our office and allow 72 hours for refills to be completed.    Today you received the following chemotherapy and/or immunotherapy agents Darzalex       To help prevent nausea and vomiting after your treatment, we encourage you to take your nausea medication as directed.  BELOW ARE SYMPTOMS THAT SHOULD BE REPORTED IMMEDIATELY: *FEVER GREATER THAN 100.4 F (38 C) OR HIGHER *CHILLS OR SWEATING *NAUSEA AND VOMITING THAT IS NOT CONTROLLED WITH YOUR NAUSEA MEDICATION *UNUSUAL SHORTNESS OF BREATH *UNUSUAL BRUISING OR BLEEDING *URINARY PROBLEMS (pain or burning when urinating, or frequent urination) *BOWEL PROBLEMS (unusual diarrhea, constipation, pain near the anus) TENDERNESS IN MOUTH AND THROAT WITH OR WITHOUT PRESENCE OF ULCERS (sore throat, sores in mouth, or a toothache) UNUSUAL RASH, SWELLING OR PAIN  UNUSUAL VAGINAL DISCHARGE OR ITCHING   Items with * indicate a potential emergency and should be followed up as soon as possible or go to the Emergency Department if any problems should occur.  Please show the CHEMOTHERAPY ALERT CARD or IMMUNOTHERAPY ALERT CARD at check-in  to the Emergency Department and triage nurse.  Should you have questions after your visit or need to cancel or reschedule your appointment, please contact [facility]  [phone] and follow the prompts.  Office hours are 8:00 a.m. to 4:30 p.m. Monday - Friday. Please note that voicemails left after 4:00 p.m. may not be returned until the following business day.  We are closed weekends and major holidays. You have access to a nurse at all times for urgent questions. Please call the main number to the clinic [phone] and follow the prompts.  For any non-urgent questions, you may also contact your provider using MyChart. We now offer e-Visits for anyone 36 and older to request care online for non-urgent symptoms. For details visit [email or web address].   Also download the MyChart app! Go to the app store, search "MyChart", open the app, select [facility], and log in with your MyChart username and password.  Due to Covid, a mask is required upon entering the hospital/clinic. If you do not have a mask, one will be given to you upon arrival. For doctor visits, patients may have 1 support person aged 47 or older with them. For treatment visits, patients cannot have anyone with them due to current Covid guidelines and our immunocompromised population.

## 2021-07-17 ENCOUNTER — Encounter: Payer: Self-pay | Admitting: Oncology

## 2021-07-17 ENCOUNTER — Other Ambulatory Visit: Payer: Self-pay

## 2021-07-17 DIAGNOSIS — C9000 Multiple myeloma not having achieved remission: Secondary | ICD-10-CM

## 2021-07-17 MED ORDER — POMALIDOMIDE 2 MG PO CAPS: 2.0000 mg | ORAL_CAPSULE | Freq: Every day | ORAL | 0 refills | Status: DC

## 2021-07-17 NOTE — Telephone Encounter (Signed)
Please advise 

## 2021-07-17 NOTE — Telephone Encounter (Signed)
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 Izola Price at [facility]), but he stopped. He is willing to restart if needed. No weakness to extremities and pain is not constant. He will keep appt on 10/6 to further discuss.

## 2021-07-19 ENCOUNTER — Ambulatory Visit
Admission: RE | Admit: 2021-07-19 | Discharge: 2021-07-19 | Disposition: A | Discharge status: Home / Self Care | Payer: Medicare Other | Source: Ambulatory Visit | Attending: Oncology | Admitting: Oncology

## 2021-07-19 ENCOUNTER — Inpatient Hospital Stay: Payer: Medicare Other | Attending: Oncology

## 2021-07-19 ENCOUNTER — Encounter: Payer: Self-pay | Admitting: Oncology

## 2021-07-19 ENCOUNTER — Inpatient Hospital Stay: Payer: Medicare Other

## 2021-07-19 ENCOUNTER — Inpatient Hospital Stay (HOSPITAL_BASED_OUTPATIENT_CLINIC_OR_DEPARTMENT_OTHER): Payer: Medicare Other | Admitting: Oncology

## 2021-07-19 VITALS — BP 136/76 | HR 60 | Temp 98.5°F | Resp 18 | Wt 205.7 lb

## 2021-07-19 DIAGNOSIS — Z7982 Long term (current) use of aspirin: Secondary | ICD-10-CM | POA: Insufficient documentation

## 2021-07-19 DIAGNOSIS — D696 Thrombocytopenia, unspecified: Secondary | ICD-10-CM | POA: Diagnosis not present

## 2021-07-19 DIAGNOSIS — Z5111 Encounter for antineoplastic chemotherapy: Secondary | ICD-10-CM

## 2021-07-19 DIAGNOSIS — C9002 Multiple myeloma in relapse: Secondary | ICD-10-CM | POA: Diagnosis present

## 2021-07-19 DIAGNOSIS — Z79899 Other long term (current) drug therapy: Secondary | ICD-10-CM | POA: Insufficient documentation

## 2021-07-19 DIAGNOSIS — M542 Cervicalgia: Secondary | ICD-10-CM | POA: Insufficient documentation

## 2021-07-19 DIAGNOSIS — N1831 Chronic kidney disease, stage 3a: Secondary | ICD-10-CM | POA: Diagnosis not present

## 2021-07-19 DIAGNOSIS — D701 Agranulocytosis secondary to cancer chemotherapy: Secondary | ICD-10-CM | POA: Insufficient documentation

## 2021-07-19 DIAGNOSIS — T451X5A Adverse effect of antineoplastic and immunosuppressive drugs, initial encounter: Secondary | ICD-10-CM

## 2021-07-19 DIAGNOSIS — C9000 Multiple myeloma not having achieved remission: Secondary | ICD-10-CM

## 2021-07-19 DIAGNOSIS — Z125 Encounter for screening for malignant neoplasm of prostate: Secondary | ICD-10-CM | POA: Insufficient documentation

## 2021-07-19 DIAGNOSIS — M8580 Other specified disorders of bone density and structure, unspecified site: Secondary | ICD-10-CM | POA: Insufficient documentation

## 2021-07-19 DIAGNOSIS — R937 Abnormal findings on diagnostic imaging of other parts of musculoskeletal system: Secondary | ICD-10-CM | POA: Insufficient documentation

## 2021-07-19 DIAGNOSIS — Z5112 Encounter for antineoplastic immunotherapy: Secondary | ICD-10-CM | POA: Insufficient documentation

## 2021-07-19 DIAGNOSIS — Z9481 Bone marrow transplant status: Secondary | ICD-10-CM | POA: Insufficient documentation

## 2021-07-19 DIAGNOSIS — M899 Disorder of bone, unspecified: Secondary | ICD-10-CM

## 2021-07-19 LAB — COMPREHENSIVE METABOLIC PANEL
ALT: 23 U/L (ref 0–44)
AST: 14 U/L — ABNORMAL LOW (ref 15–41)
Albumin: 3.9 g/dL (ref 3.5–5.0)
Alkaline Phosphatase: 69 U/L (ref 38–126)
Anion gap: 7 (ref 5–15)
BUN: 25 mg/dL — ABNORMAL HIGH (ref 8–23)
CO2: 24 mmol/L (ref 22–32)
Calcium: 8.6 mg/dL — ABNORMAL LOW (ref 8.9–10.3)
Chloride: 105 mmol/L (ref 98–111)
Creatinine, Ser: 1.22 mg/dL (ref 0.61–1.24)
GFR, Estimated: >60 mL/min (ref >60)
Glucose, Bld: 106 mg/dL — ABNORMAL HIGH (ref 70–99)
Potassium: 4.2 mmol/L (ref 3.5–5.1)
Sodium: 136 mmol/L (ref 135–145)
Total Bilirubin: 0.6 mg/dL (ref 0.3–1.2)
Total Protein: 6.9 g/dL (ref 6.5–8.1)

## 2021-07-19 LAB — CBC WITH DIFFERENTIAL/PLATELET
Abs Immature Granulocytes: 0.01 10*3/uL (ref 0.00–0.07)
Basophils Absolute: 0 10*3/uL (ref 0.0–0.1)
Basophils Relative: 0 %
Eosinophils Absolute: 0.2 10*3/uL (ref 0.0–0.5)
Eosinophils Relative: 10 %
HCT: 35.5 % — ABNORMAL LOW (ref 39.0–52.0)
Hemoglobin: 11.8 g/dL — ABNORMAL LOW (ref 13.0–17.0)
Immature Granulocytes: 0 %
Lymphocytes Relative: 35 %
Lymphs Abs: 0.8 10*3/uL (ref 0.7–4.0)
MCH: 34.1 pg — ABNORMAL HIGH (ref 26.0–34.0)
MCHC: 33.2 g/dL (ref 30.0–36.0)
MCV: 102.6 fL — ABNORMAL HIGH (ref 80.0–100.0)
Monocytes Absolute: 0.5 10*3/uL (ref 0.1–1.0)
Monocytes Relative: 24 %
Neutro Abs: 0.7 10*3/uL — ABNORMAL LOW (ref 1.7–7.7)
Neutrophils Relative %: 31 %
Platelets: 67 10*3/uL — ABNORMAL LOW (ref 150–400)
RBC: 3.46 MIL/uL — ABNORMAL LOW (ref 4.22–5.81)
RDW: 13.8 % (ref 11.5–15.5)
WBC: 2.3 10*3/uL — ABNORMAL LOW (ref 4.0–10.5)
nRBC: 0 % (ref 0.0–0.2)

## 2021-07-19 IMAGING — CR DG CERVICAL SPINE COMPLETE 4+V
5 series · 5 of 5 positions shown · non-contrast
Comparison: [DATE]

CLINICAL DATA: Multiple myeloma

Neck pain for few weeks
EXAM:
CERVICAL SPINE - COMPLETE 4+ VIEW

[c-spine lat]
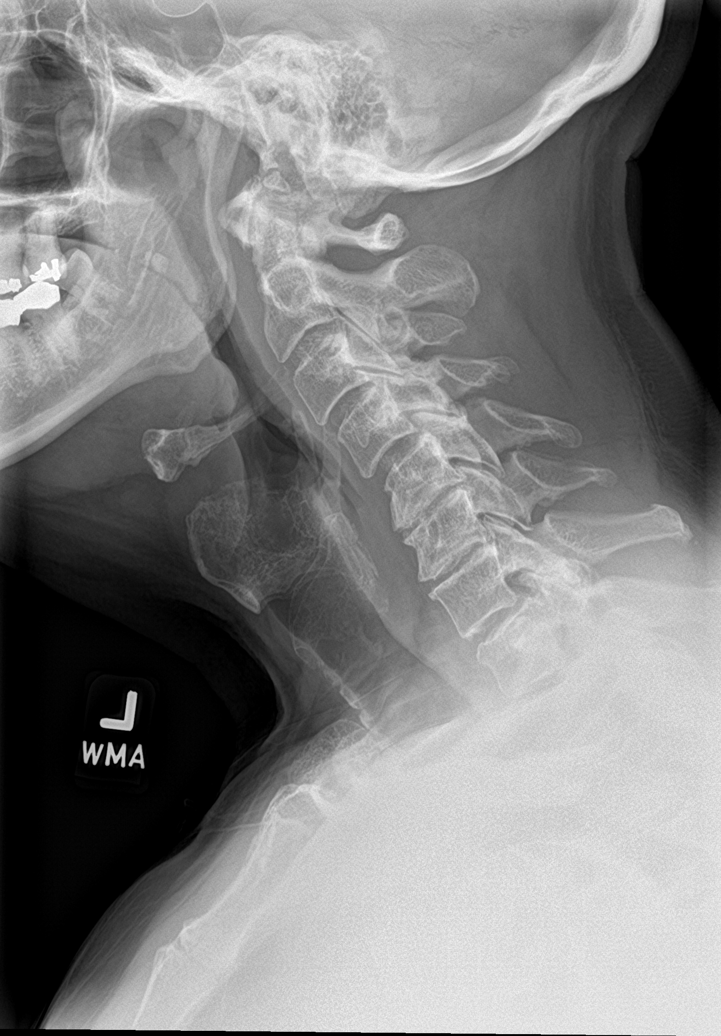

[c-spine obl (1 of 2)]
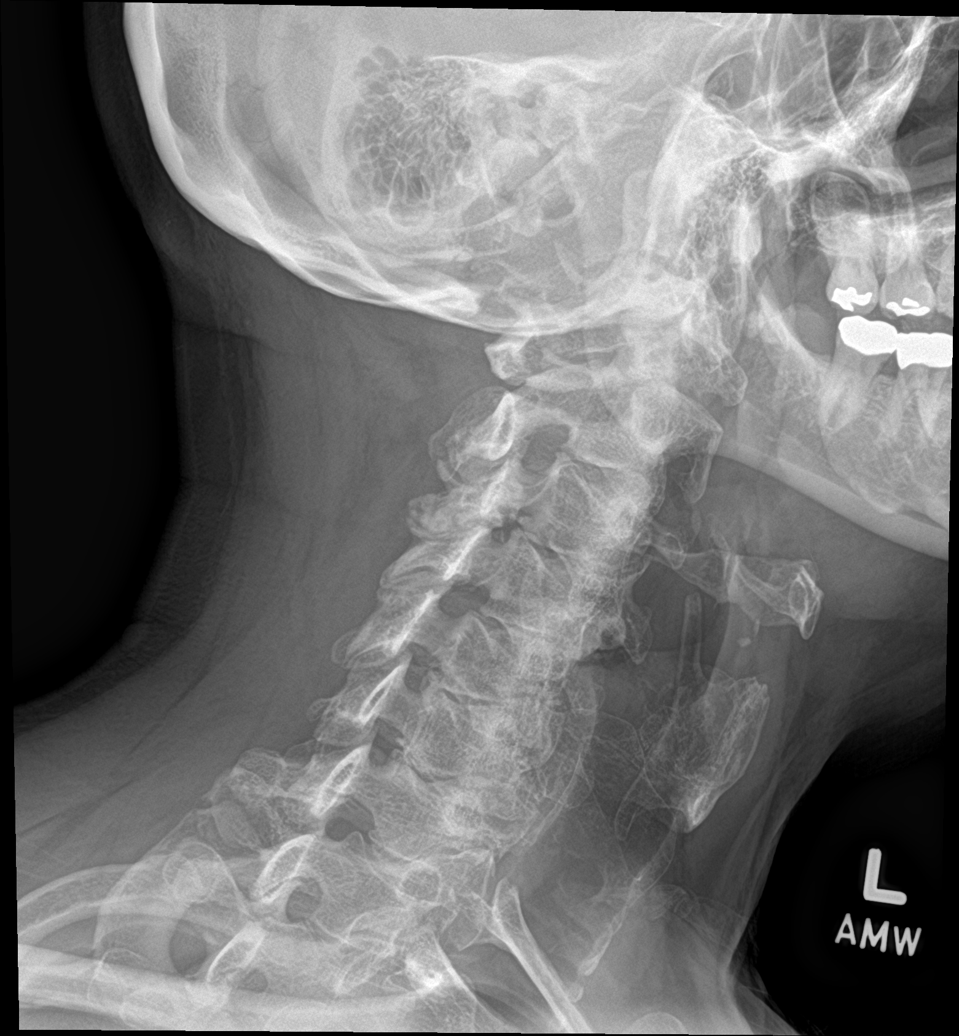

[c-spine obl (2 of 2)]
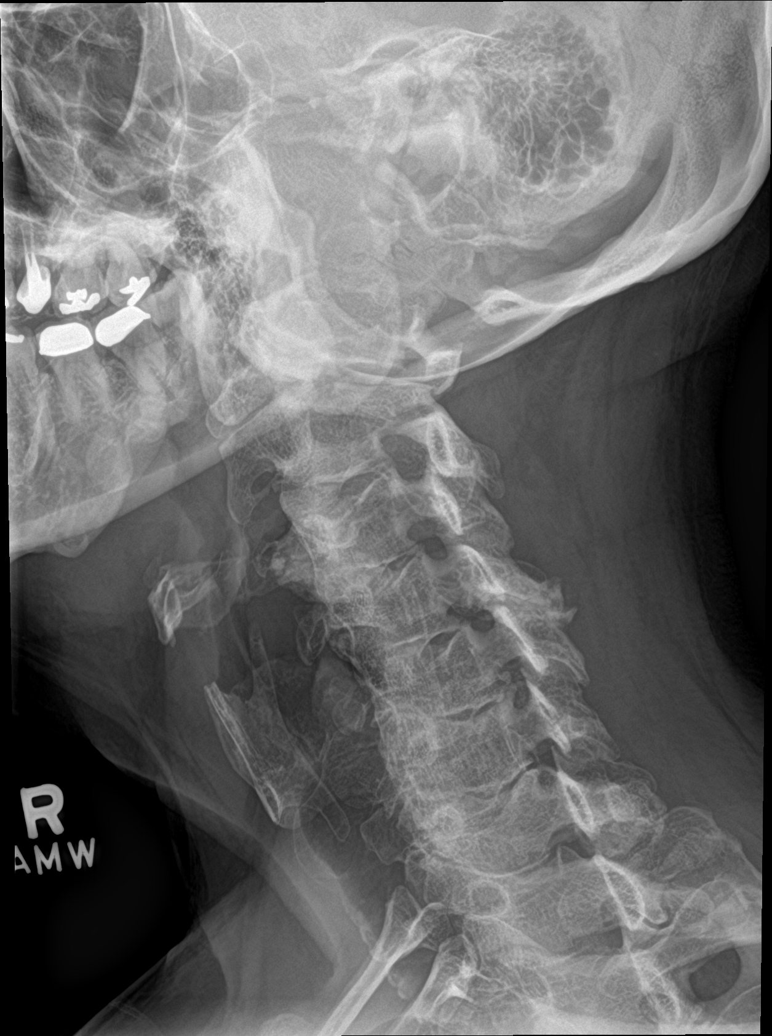

[c-spine ap]
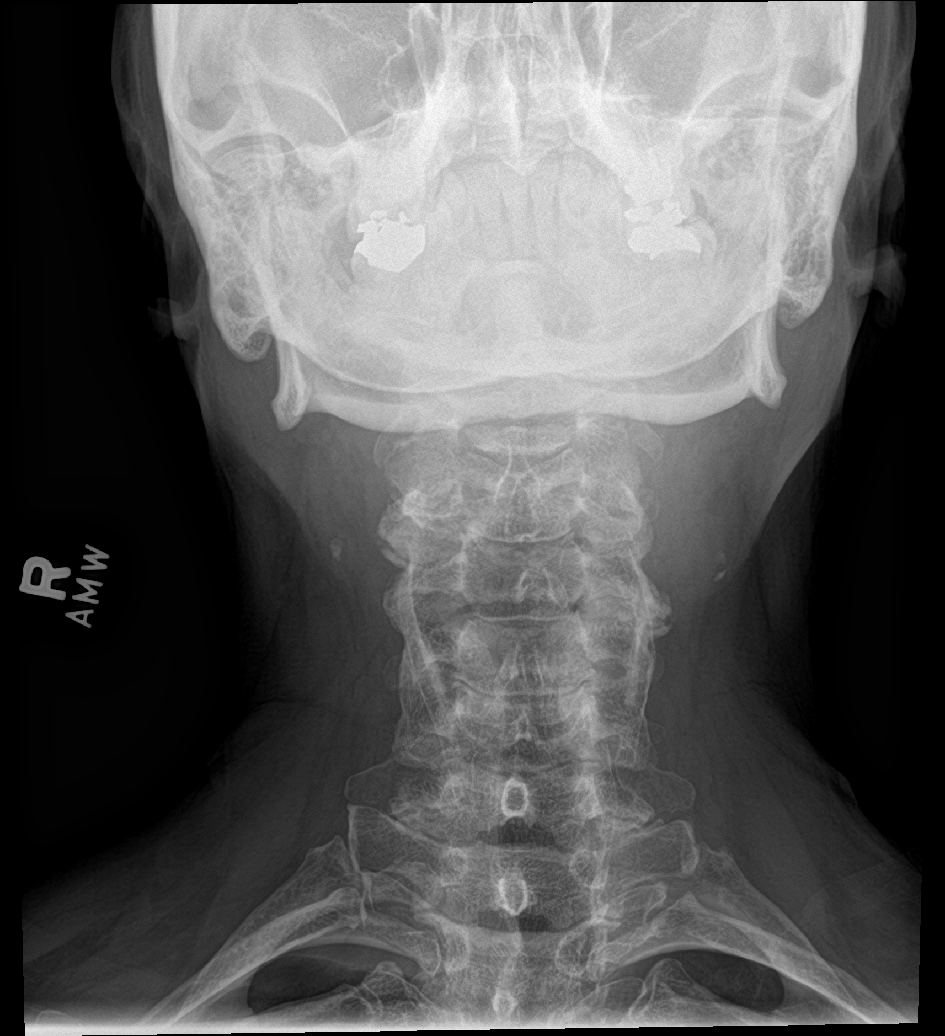

[c-spine open mouth]
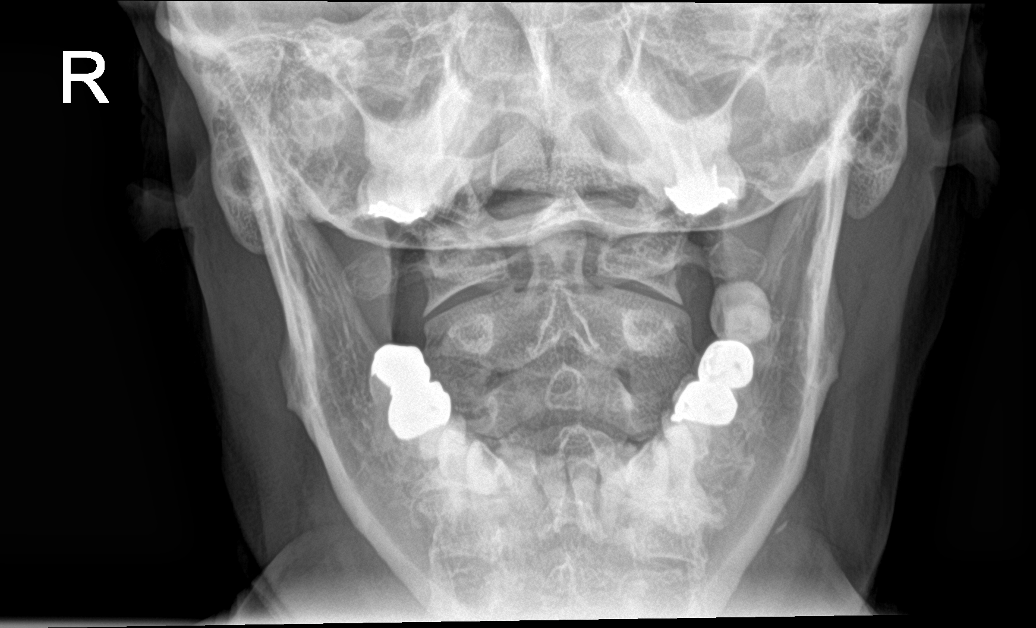

[5 of 5 positions shown; findings below may reference images not displayed]

FINDINGS: There is reversal of normal cervical lordosis with apex at C5. There
is minimal retrolisthesis C5 on C6. No significant vertebral body
height loss. Moderate disc height loss and endplate degenerative
changes seen at C5-C6. Mild disc height loss and endplate spurring
seen at C6-C7.

Severe right neural foraminal stenosis at C3-C4. Mild right neural
foraminal stenosis at C5-C6 and C6-C7.

Moderate left neural foraminal stenosis at C4-C5 and C5-C6. Mild
left neural foraminal stenosis at C3-C4 and C6-C7. is

Facet degenerative changes seen throughout the cervical spine.
Minimal atheromatous plaque noted in the region the carotid bulbs.
IMPRESSION: Multilevel degenerative changes of the cervical spine as above.

## 2021-07-19 MED ORDER — CYCLOBENZAPRINE HCL 5 MG PO TABS: 5.0000 mg | ORAL_TABLET | Freq: Every day | ORAL | 0 refills | Status: DC | PRN

## 2021-07-19 NOTE — Progress Notes (Signed)
Hematology/Oncology  Follow up note [facility] [phone] Fax:([phone]) [phone]   Patient Care Team: Tonia Ghent, MD as PCP - General (Family Medicine) Earlie Server, MD as Consulting Physician (Oncology) Beather Arbour Lilyan Punt, MD as Referring Physician (Hematology and Oncology) Diannia Ruder, MD as Referring Physician (Hematology and Oncology)  REASON FOR VISIT Follow up for multiple myeloma.   HISTORY OF PRESENTING ILLNESS:  Luis Burke is a  65 y.o.  male with PMH listed below presents for follow up of multiple myeloma.  # 07/27/2018 multiple myeloma panel showed M protein of 0.1, IgG 662, IgA 49, IgM 7. Patient was called back to further labs done. 08/10/2018, free light chain ratio showed extremely high level of kappa free light chain 10,183, with a kappa lambda light chain ratio of 1414.31 LDH 164 Beta-2 microglobulin 5 Patient was called and discuss about results.  He was recommended to undergo bone marrow biopsy and PET scan. 08/19/2018 bone marrow biopsy showed hypercellular marrow 80%, involved by plasma cell neoplasm up to 95%.  Consistent with plasma cell myeloma. Myeloma FISH  gain of gain of CEP12  09/10/2018 skeletal survey showed questionable small lucent lesions within the midshaft of the humerus bilaterally.  Otherwise no suspicious focal lytic lesion or acute bone abnormality. 08/25/2018 PET scan showed no definite hypermetabolic bone disease but CT findings are highly suspicious for numerous small myelomatous lesions involving the spine, sternum and scattered ribs.  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 He received preparative regimen with melphalan 200 mg/m on 04/22/2019 followed by autologous stem cell infusion on 04/23/2019. Currently he is transfusion independent.  Transfusion criteria with as needed hemoglobin less than 7.5 for platelet less than 10,000.  Transplant course was complicated with  febrile neutropenia grade 3, treated with vancomycin and cephapirin until engraftment on 05/06/2019. Patient also had engraftment syndrome and had to be started on Solu-Medrol 25 mg twice daily on 05/05/2019.  Transition to Medrol Dosepak on 05/07/2019 to complete steroid taper as outpatient.  Patient is currently on acyclovir for 1 year after transplant, dose was reduced on 722 11/20/2018 due to renal function. Patient had a baseline CKD with creatinine running between 1.4-1.6.  He had acute on chronic kidney injury and creatinine went up to 2.2 on 722.  Fluid hydration was given and creatinine decreased to baseline 1.5-1.7. His Hickman catheter was removed.  Candida Cruris treated with topical clotrimazole 1% 3 times daily and to resolution..  #  seen by Dr. Tonia Brooms BMT team on 06/30/2019.revaccination at [facility] begins at 12 months post transplant I discussed with [facility] hematology Dr.Choi and he recommends plans as listed below.  -patient to be started on Ixazomib maintenance: -Ixazomib 77m on D1/D8/D15 out of 28 day cycle.  -patient will start re-vaccination 12 months post transplant.  -Post transplant bone marrow biopsy if clinically indicated.  # seen by [facility] bone marrow transplant team in June 2021.  Patient has been started on immunization protocol.  He reports no new complaints.  #07[facility]12[facility]2020 [facility]  Lucent lesion in the L4 spinous process measuring 4 mm which is  nonspecific. Consider confirmation of marrow replacing process with MRI.  2.  Incompletely characterized renal cysts   # He continues to have chronic back pain and neck pain.  [facility] MRI lumbar spine with and without contrast showed no evidence of spinal metastasis.  Lumbar spondylosis is most pronounced  at L5-S1 where there is severe right and moderate left neural foraminal stenosis. 08/11/2020, x-ray cervical spine complete with flexion and extension showed The cervical spine  is visualized from C1 to the top of T1 on the lateral  view.  Reversal of the normal cervical lordosis with a focal kyphosis centered at the C5-C6 intervertebral level. Trace anterolisthesis of C4 onto C5. Trace retrolisthesis of C6 on C7. No evidence of dynamic instability is noted on  the flexion or extension views.   No prevertebral soft tissue swelling. Cervical vertebral body heights are maintained. Multilevel degenerative changes of the cervical spine including discogenic disease, most pronounced at C5-C6 and C6-C7, and facet arthropathy most pronounced at C4-C5. Multilevel mild to moderate left neural foraminal osseous narrowing of the mid to lower cervical vertebrae, possibly centrally/artifactual by patient positioning.  #07/28/2020 bone marrow normocellular marrow with polytypic plasmacytosis, 3 to 8%   Patient is in remission.  Cytogenetics negative for trisomy 12.  Negative myeloma FISH panel.   # Lumbar lesion,  11/13/20 [facility]  MRI cervical sine w/wo contrast - no evidence of metastatic disease C3-4 severe spinal stenosis, left neural foraminal narrowing. C4-5 spinal canal stenosis. [facility] MRI results showed no metastatic spine lesion. Chronic back pain and neck pain, images are consistent with chronic degenerative disease.  Patient continues to follow-up with [place] orthopedic surgeon.  04/03/2021, light chain ratio increased to 3.76.   05/15/2021 bone marrow biopsy showed slightly hypercellular bone marrow for age with slight plasmacytosis.  5% plasma cells with kappa light chain restriction.  Cytogenetics normal.  Myeloma FISH negative # 05/15/2021 bone marrow biopsy showed slightly hypercellular bone marrow for age with slight plasmacytosis.  5% plasma cells with kappa light chain restriction.  Cytogenetics normal.  Myeloma FISH negative  # 05/31/2021 Daratumumab -Pomalyst-dexamethasone-D1 patient had grade 2 infusion reaction to daratumumab and received steroids, Benadryl, famotidine.  He was  able to finish treatment.D8 was able to finish Daratumumab with no infusion reactions.  INTERVAL HISTORY Luis Burke is a 65 y.o. male who has above history reviewed by me today for follow-up  Currently on Daratumumab Pomalyst dexamethasone He notice left neck pain which happened a few days ago. Worse with movement. No extremity weakness.  Patient denies any nausea vomiting diarrhea.  Chronic back pain unchanged.   Review of Systems  Constitutional:  Negative for appetite change, chills, fatigue, fever and unexpected weight change.  HENT:   Negative for hearing loss and voice change.   Eyes:  Negative for eye problems and icterus.  Respiratory:  Negative for chest tightness, cough and shortness of breath.   Cardiovascular:  Negative for chest pain and leg swelling.  Gastrointestinal:  Negative for abdominal distention and abdominal pain.  Endocrine: Negative for hot flashes.  Genitourinary:  Negative for difficulty urinating, dysuria and frequency.   Musculoskeletal:  Positive for back pain and neck pain. Negative for arthralgias.  Skin:  Negative for itching and rash.  Neurological:  Negative for light-headedness and numbness.  Hematological:  Negative for adenopathy. Does not bruise/bleed easily.  Psychiatric/Behavioral:  Negative for confusion.    MEDICAL HISTORY:  Past Medical History:  Diagnosis Date   AKI (acute kidney injury) ([place]) 01/06/2019   Anemia    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[place])    autologous stem cell bone marrow transplant on 04/23/2019.   Fatty liver    on u/s 07/2018   Hyperlipidemia 03/2004   Hypertension 1994   Multiple myeloma ([place])    Snores  SURGICAL HISTORY: Past Surgical History:  Procedure Laterality Date   CATARACT EXTRACTION W/PHACO Right 03/04/2018   Procedure: CATARACT EXTRACTION PHACO AND INTRAOCULAR LENS PLACEMENT ([place])  RIGHT TORIC;  Surgeon: Leandrew Koyanagi, MD;  Location: [place];  Service: Ophthalmology;   Laterality: Right;  [place] no Toric Lens 1:45 5.3   COLONOSCOPY WITH PROPOFOL N/A 07/20/2018   Procedure: COLONOSCOPY WITH PROPOFOL;  Surgeon: Jonathon Bellows, MD;  Location: [facility] ENDOSCOPY;  Service: Gastroenterology;  Laterality: N/A;   ESOPHAGOGASTRODUODENOSCOPY (EGD) WITH PROPOFOL N/A 07/20/2018   Procedure: ESOPHAGOGASTRODUODENOSCOPY (EGD) WITH PROPOFOL;  Surgeon: Jonathon Bellows, MD;  Location[facility] ENDOSCOPY;  Service: Gastroenterology;  Laterality: N/A;   EYE SURGERY Right    HERNIA REPAIR     L Lap herniorraphy  04/2000   Left wrist ganglionectomy      SOCIAL HISTORY: Social History   Socioeconomic History   Marital status: Single    Spouse name: Not on file   Number of children: 1   Years of education: Not on file   Highest education level: Not on file  Occupational History   Occupation: [employer]  Tobacco Use   Smoking status: Never   Smokeless tobacco: Never   Tobacco comments:    Occassionally  Vaping Use   Vaping Use: Never used  Substance and Sexual Activity   Alcohol use: Not Currently    Alcohol/week: 3.0 standard drinks    Types: 3 Cans of beer per week   Drug use: No   Sexual activity: Not on file  Other Topics Concern   Not on file  Social History Narrative   Divorced 01-Dec-2007, dating as of 2017/11/30   His daughter died 4 days after giving birth to the patient's granddaughter   Has joint custody of his dead daughter's child   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 Strain: Not on [employer] Insecurity: Not on file  Transportation Needs: Not on file  Physical Activity: Not on file  Stress: Not on file  Social Connections: Not on file  Intimate Partner Violence: Not on file    FAMILY HISTORY: Family History  Problem Relation Age of Onset   Hypertension Mother    Diabetes Mother    Heart disease Mother        CAD   Dementia Mother    Hypertension Father    Heart disease Father        MI 02/03    Colon cancer Father    Hypertension Sister    Hypertension Sister    Hypertension Sister    Prostate cancer Neg Hx     ALLERGIES:  is allergic to bactrim [sulfamethoxazole-trimethoprim], clonidine derivatives, and tadalafil.  MEDICATIONS:  Current Outpatient Medications  Medication Sig Dispense Refill   acetaminophen (TYLENOL) 325 MG tablet Take 2 tablets (650 mg total) by mouth See admin instructions. 1 hour prior to chemotherapy treatments 30 tablet 0   amLODipine (NORVASC) 5 MG tablet TAKE 2 TABLETS BY MOUTH EVERY DAY 180 tablet 1   ASPIRIN 81 PO Take 81 mg by mouth daily.     B Complex Vitamins (VITAMIN B COMPLEX PO) SMARTSIG:By Mouth     chlorhexidine (PERIDEX) 0.12 % solution Use as directed 15 mLs in the mouth or throat 2 (two) times daily. 473 mL 1   clopidogrel (PLAVIX) 75 MG tablet Take by mouth.     co-enzyme Q-10 50 MG capsule Take 50 mg by mouth daily.  [employer] VITAMIN B12 1000 MCG tablet TAKE 1 TABLET BY MOUTH EVERY DAY 90 tablet 3   diphenhydrAMINE (BENADRYL) 50 MG tablet Take 1 tablet (50 mg total) by mouth See admin instructions. Take 1 tablet 1 hour prior to chemotherapy treatments. 30 tablet 0   hydrALAZINE (APRESOLINE) 10 MG tablet TAKE 1 TABLET (10 MG TOTAL) BY MOUTH 3 (THREE) TIMES DAILY. 270 tablet 1   lisinopril (ZESTRIL) 20 MG tablet Take 1 tablet (20 mg total) by mouth daily. Please keep appt on 07/05/21 90 tablet 0   LORazepam (ATIVAN) 1 MG tablet TAKE 1 TABLET (1 MG TOTAL) BY MOUTH EVERY 6 (SIX) HOURS AS NEEDED FOR ANXIETY (FOR NAUSEA)     magnesium chloride (SLOW-MAG) 64 MG TBEC SR tablet Take 1 tablet (64 mg total) by mouth daily. 60 tablet 0   metoprolol succinate (TOPROL-XL) 50 MG 24 hr tablet TAKE 4 TABLETS (200 MG TOTAL) BY MOUTH DAILY. TAKE WITH OR IMMEDIATELY FOLLOWING A MEAL. 360 tablet 2   montelukast (SINGULAIR) 10 MG tablet Take 1 tablet (10 mg total) by mouth See admin instructions. Take 1day prior to chemotherapy 30 tablet 1   ondansetron (ZOFRAN) 8  MG tablet Take 1 tablet (8 mg total) by mouth every 8 (eight) hours as needed. for nausea 30 tablet 1   pomalidomide (POMALYST) 2 MG capsule Take 1 capsule (2 mg total) by mouth daily. Take for 21 days on, then hold for 7 days. Repeat every 28 days. 21 capsule 0   pravastatin (PRAVACHOL) 10 MG tablet TAKE 1 TABLET BY MOUTH EVERY DAY 90 tablet 3   zinc gluconate 50 MG tablet Take 50 mg by mouth daily.     acyclovir (ZOVIRAX) 400 MG tablet Take 400 mg by mouth 2 (two) times daily. (Patient not taking: No sig reported)     cyclobenzaprine (FLEXERIL) 5 MG tablet Take 1 tablet (5 mg total) by mouth daily as needed for muscle spasms. 30 tablet 0   dexamethasone (DECADRON) 4 MG tablet Take 5 tablets (20 mg total) by mouth once a week. Take at least 1 hour prior to infusion appt. Take with food. To start on 06/07/21. (Patient not taking: Reported on 07/19/2021) 20 tablet 1   loperamide (IMODIUM) 2 MG capsule Take 1 capsule (2 mg total) by mouth See admin instructions. Take 2 capsules at the onset of diarrhea, then 1 capsule every 2 hours or after every loose bowel movement. Maximum 8 capsules per 24 hours. (Patient not taking: No sig reported) 60 capsule 0   prochlorperazine (COMPAZINE) 10 MG tablet TAKE 1 TABLET (10 MG TOTAL) BY MOUTH EVERY 6 (SIX) HOURS AS NEEDED FOR NAUSEA (Patient not taking: No sig reported)     No current facility-administered medications for this visit.     PHYSICAL EXAMINATION: ECOG PERFORMANCE STATUS: 1 - Symptomatic but completely ambulatory Vitals:   07/19/21 0839  BP: 136/76  Pulse: 60  Resp: 18  Temp: 98.5 F (36.9 C)   Filed Weights   07/19/21 0839  Weight: 205 lb 11.2 oz (93.3 kg)    Physical Exam Constitutional:      General: He is not in acute distress. HENT:     Head: Normocephalic and atraumatic.  Eyes:     General: No scleral icterus. Cardiovascular:     Rate and Rhythm: Normal rate and regular rhythm.     Heart sounds: Normal heart sounds.   Pulmonary:     Effort: Pulmonary effort is normal. No respiratory distress.  Breath sounds: No wheezing.  Abdominal:     General: Bowel sounds are normal. There is no distension.     Palpations: Abdomen is soft.  Musculoskeletal:        General: No deformity. Normal range of motion.     Cervical back: Normal range of motion and neck supple.  Skin:    General: Skin is warm and dry.     Findings: No erythema or rash.  Neurological:     Mental Status: He is alert and oriented to person, place, and time. Mental status is at baseline.     Cranial Nerves: No cranial nerve deficit.     Coordination: Coordination normal.  Psychiatric:        Mood and Affect: Mood normal.     LABORATORY DATA:  I have reviewed the data as listed Lab Results  Component Value Date   WBC 2.3 (L) 07/19/2021   HGB 11.8 (L) 07/19/2021   HCT 35.5 (L) 07/19/2021   MCV 102.6 (H) 07/19/2021   PLT 67 (L) 07/19/2021   Recent Labs    07/03/21 1128 07/12/21 0935 07/19/21 0800  NA 137 137 136  K 4.8 4.6 4.2  CL 100 99 105  CO2 _0 GLUCOSE 99 96 106*  BUN 22 20 25*  CREATININE 1.50* 1.36* 1.22  CALCIUM 9.9 10.1 8.6*  GFRNONAA 51* 58* >60  PROT 7.1 7.0 6.9  ALBUMIN 4.2 4.5 3.9  AST 18 18 14*  ALT 25 62* 23  ALKPHOS 59 76 69  BILITOT 0.8 0.9 0.6    Iron/TIBC/Ferritin/ %Sat    Component Value Date/Time   IRON 74 07/01/2018 0944   TIBC 250 07/01/2018 0944   FERRITIN 357 07/01/2018 0944   IRONPCTSAT 30 07/01/2018 0944    Lab Results  Component Value Date   TOTALPROTELP 6.7 06/26/2021   ALBUMINELP 4.0 03/08/2021   A1GS 0.2 03/08/2021   A2GS 0.7 03/08/2021   BETS 1.1 03/08/2021   GAMS 1.1 03/08/2021   MSPIKE Not Observed 03/08/2021   SPEI Comment 03/08/2021   Lab Results  Component Value Date   KPAFRELGTCHN 28.5 (H) 06/26/2021   LAMBDASER 10.0 06/26/2021   KAPLAMBRATIO 2.85 (H) 06/26/2021    RADIOGRAPHIC STUDIES: I have personally reviewed the radiological images as listed  and agreed with the findings in the report. MR Lumbar Spine W Wo Contrast  Result Date: 06/19/2021 CLINICAL DATA:  Back pain, multiple myeloma EXAM: MRI LUMBAR SPINE WITHOUT AND WITH CONTRAST TECHNIQUE: Multiplanar and multiecho pulse sequences of the lumbar spine were obtained without and with intravenous contrast. CONTRAST:  75m GADAVIST GADOBUTROL 1 MMOL/ML IV SOLN COMPARISON:  None. FINDINGS: Segmentation:  Standard. Alignment:  No significant listhesis. Vertebrae: Vertebral body heights are maintained. No substantial marrow edema. No suspicious osseous lesion. Conus medullaris and cauda equina: Conus extends to the T12-L1 level. Conus and cauda equina appear normal. No abnormal intrathecal enhancement. Paraspinal and other soft tissues: Probable bilateral renal cysts partially imaged. Disc levels: L1-L2:  No canal or foraminal stenosis. L2-L3:  No canal or foraminal stenosis. L3-L4:  Disc bulge.  No canal or foraminal stenosis. L4-L5: Disc bulge. Central and left subarticular annular fissure. No canal or foraminal stenosis. L5-S1: Disc bulge with endplate osteophytic ridging. Facet arthropathy. No canal stenosis. Moderate right and mild left foraminal stenosis. Potential exiting right L5 nerve root compression. IMPRESSION: No evidence of myelomatous involvement. No compression fracture. Mild lower lumbar degenerative changes. There is right foraminal narrowing at L5-S1 with potential exiting  nerve root compression. Electronically Signed   By: Macy Mis M.D.   On: 06/19/2021 12:20   CT BONE MARROW BIOPSY & ASPIRATION  Result Date: 05/15/2021 CLINICAL DATA:  History of multiple myeloma with possible relapse due to increased light chains. Need for bone marrow biopsy for further evaluation. EXAM: CT GUIDED BONE MARROW ASPIRATION AND BIOPSY ANESTHESIA/SEDATION: Versed 2.0 mg IV, Fentanyl 100 mcg IV Total Moderate Sedation Time:   13 minutes. The patient's level of consciousness and physiologic status were  continuously monitored during the procedure by Radiology nursing. PROCEDURE: The procedure risks, benefits, and alternatives were explained to the patient. Questions regarding the procedure were encouraged and answered. The patient understands and consents to the procedure. A time out was performed prior to initiating the procedure. The right gluteal region was prepped with chlorhexidine. Sterile gown and sterile gloves were used for the procedure. Local anesthesia was provided with 1% Lidocaine. Under CT guidance, an 11 gauge On Control bone cutting needle was advanced from a posterior approach into the right iliac bone. Needle positioning was confirmed with CT. Initial non heparinized and heparinized aspirate samples were obtained of bone marrow. Core biopsy was performed via the On Control drill needle. Two separate core biopsy samples were obtained. COMPLICATIONS: None FINDINGS: Inspection of initial aspirate did reveal visible particles. Intact core biopsy samples were obtained. IMPRESSION: CT guided bone marrow biopsy of right posterior iliac bone with both aspirate and core samples obtained. Electronically Signed   By: Aletta Edouard M.D.   On: 05/15/2021 10:51      ASSESSMENT & PLAN:  1. Encounter for antineoplastic chemotherapy   2. Multiple myeloma in relapse ([place])   3. Thrombocytopenia ([facility])   4. Stage 3a chronic kidney disease ([place])   5. Bone lesion   6. Chemotherapy induced neutropenia ([facility])   7. Neck pain    #Light chain multiple myeloma beta 2 microglobulin 5 and normal albumin.  Stage II.cytogenetics showed normal male chromosome, MDS FISH panel showed trisomy 17- Standard Risk.S/p RVD x 9 and  Autologous bone marrow stem cell transplant at [facility] on 04/23/2019.' Early relapse multiple myeloma. Labs are reviewed and discussed with patient. Hold treatment due to neutropenia.   # Chemotherapy induced neutropenia, ANC 0.7.  Neutropenia precaution was discussed with patient.   #  Thrombocytopenia, monitor closely.  Platelet count 67,000. # CKD, avoid nephrotoxin.  Creatinine stable,   #Neck pain, previous Jan 2022 MRI cervical spine showed DJD.  Check X ray of cervical spine.  Refer to physical therapy.  Flexeril 89m daily PRN  Post bone marrow transplant care He has completed 1 year of acyclovir.  Off acyclovir now. [facility]   #Bone health/osteopenia/multiple myeloma-  Continue Xgeva monthly.- next visit 07/26/21 Continue calcium and vitamin D supplementation. Follow up 1 week for Lab MD Daratumumab treatment.   ZEarlie Server MD, PhD Hematology Oncology [facility]10/03/2021

## 2021-07-19 NOTE — Progress Notes (Signed)
Pt here for follow up. Pt reports new pain to left side of neck that started over the weekend.

## 2021-07-23 ENCOUNTER — Other Ambulatory Visit: Payer: Self-pay

## 2021-07-23 ENCOUNTER — Encounter: Payer: Self-pay | Admitting: Family Medicine

## 2021-07-23 ENCOUNTER — Ambulatory Visit (INDEPENDENT_AMBULATORY_CARE_PROVIDER_SITE_OTHER): Payer: Medicare Other | Admitting: Family Medicine

## 2021-07-23 VITALS — BP 122/88 | HR 65 | Temp 97.8°F | Ht 70.0 in | Wt 206.0 lb

## 2021-07-23 DIAGNOSIS — E785 Hyperlipidemia, unspecified: Secondary | ICD-10-CM | POA: Diagnosis not present

## 2021-07-23 DIAGNOSIS — Z0000 Encounter for general adult medical examination without abnormal findings: Secondary | ICD-10-CM | POA: Diagnosis not present

## 2021-07-23 DIAGNOSIS — Z7189 Other specified counseling: Secondary | ICD-10-CM

## 2021-07-23 DIAGNOSIS — C9002 Multiple myeloma in relapse: Secondary | ICD-10-CM

## 2021-07-23 DIAGNOSIS — Z125 Encounter for screening for malignant neoplasm of prostate: Secondary | ICD-10-CM

## 2021-07-23 DIAGNOSIS — I10 Essential (primary) hypertension: Secondary | ICD-10-CM | POA: Diagnosis not present

## 2021-07-23 DIAGNOSIS — N529 Male erectile dysfunction, unspecified: Secondary | ICD-10-CM | POA: Diagnosis not present

## 2021-07-23 MED ORDER — SILDENAFIL CITRATE 20 MG PO TABS: 20.0000 mg | ORAL_TABLET | Freq: Every day | ORAL | 12 refills | Status: DC | PRN

## 2021-07-23 MED ORDER — PRAVASTATIN SODIUM 10 MG PO TABS: 10.0000 mg | ORAL_TABLET | Freq: Every day | ORAL | 3 refills | Status: DC

## 2021-07-23 NOTE — Progress Notes (Signed)
This visit occurred during the SARS-CoV-2 public health emergency.  Safety protocols were in place, including screening questions prior to the visit, additional usage of staff PPE, and extensive cleaning of exam room while observing appropriate contact time as indicated for disinfecting solutions.  I have personally reviewed the Medicare Annual Wellness questionnaire and have noted 1. The patient's medical and social history 2. Their use of alcohol, tobacco or illicit drugs 3. Their current medications and supplements 4. The patient's functional ability including ADL's, fall risks, home safety risks and hearing or visual             impairment. 5. Diet and physical activities 6. Evidence for depression or mood disorders  The patients weight, height, BMI have been recorded in the chart and visual acuity is per eye clinic.  I have made referrals, counseling and provided education to the patient based review of the above and I have provided the pt with a written personalized care plan for preventive services.  Provider list updated- see scanned forms.  Routine anticipatory guidance given to patient.  See health maintenance. The possibility exists that previously documented standard health maintenance information may have been brought forward from a previous encounter into this note.  If needed, that same information has been updated to reflect the current situation based on today's encounter.    Flu deferred today, he can get this done later this fall. Shingles previously done PNA up-to-date Tetanus 2019 COVID-vaccine previously done. Colonoscopy up-to-date Prostate cancer screening pending 2022 Advance directive-wife Cyril Mourning designated if patient were incapacitated. Cognitive function addressed- see scanned forms- and if abnormal then additional documentation follows.   Hearing and vision screen in chart.  EKG in chart.  In addition to [facility], follow up visit for the below  conditions:  He got married this August.    He is following with hematology about myeloma.  I will defer.  He agrees.  Hypertension:    Using medication without problems or lightheadedness: yes Chest pain with exertion:no Edema:no Short of breath: he can walk about 20 minutes then needs to rest.   Prev labs d/w pt.    Elevated Cholesterol: Using medications without problems:yes Muscle aches: he has aches that could be from DJD/DDD vs statin, d/w pt.  He didn't think his statin was contributing.   Diet compliance: yes Exercise: yes  ED d/w pt.  He has used sildenafil prev w/o ADE and it worked.    PMH and SH reviewed  Meds, vitals, and allergies reviewed.   ROS: Per HPI.  Unless specifically indicated otherwise in HPI, the patient denies:  General: fever. Eyes: acute vision changes ENT: sore throat Cardiovascular: chest pain Respiratory: SOB GI: vomiting GU: dysuria Musculoskeletal: acute back pain Derm: acute rash Neuro: acute motor dysfunction Psych: worsening mood Endocrine: polydipsia Heme: bleeding Allergy: hayfever  GEN: nad, alert and oriented HEENT: ncat NECK: supple w/o LA CV: rrr. PULM: ctab, no inc wob ABD: soft, +bs EXT: no edema SKIN: no acute rash

## 2021-07-23 NOTE — Patient Instructions (Signed)
When hematology clears you to get a flu shot, you should be able to get it here.  Please call for a nurse visit.  I'll check with hematology about getting your other labs done.  Take care.  Glad to see you. Don't use nitroglycerin with sildenafil.

## 2021-07-26 ENCOUNTER — Encounter: Payer: Self-pay | Admitting: Oncology

## 2021-07-26 ENCOUNTER — Inpatient Hospital Stay (HOSPITAL_BASED_OUTPATIENT_CLINIC_OR_DEPARTMENT_OTHER): Payer: Medicare Other | Admitting: Oncology

## 2021-07-26 ENCOUNTER — Other Ambulatory Visit: Payer: Self-pay

## 2021-07-26 ENCOUNTER — Inpatient Hospital Stay: Payer: Medicare Other

## 2021-07-26 VITALS — BP 124/75 | HR 62 | Temp 97.7°F | Resp 16 | Wt 208.5 lb

## 2021-07-26 DIAGNOSIS — Z5112 Encounter for antineoplastic immunotherapy: Secondary | ICD-10-CM | POA: Diagnosis present

## 2021-07-26 DIAGNOSIS — D696 Thrombocytopenia, unspecified: Secondary | ICD-10-CM

## 2021-07-26 DIAGNOSIS — E7800 Pure hypercholesterolemia, unspecified: Secondary | ICD-10-CM

## 2021-07-26 DIAGNOSIS — Z0000 Encounter for general adult medical examination without abnormal findings: Secondary | ICD-10-CM | POA: Insufficient documentation

## 2021-07-26 DIAGNOSIS — D701 Agranulocytosis secondary to cancer chemotherapy: Secondary | ICD-10-CM

## 2021-07-26 DIAGNOSIS — C9000 Multiple myeloma not having achieved remission: Secondary | ICD-10-CM

## 2021-07-26 DIAGNOSIS — R937 Abnormal findings on diagnostic imaging of other parts of musculoskeletal system: Secondary | ICD-10-CM | POA: Diagnosis not present

## 2021-07-26 DIAGNOSIS — T451X5A Adverse effect of antineoplastic and immunosuppressive drugs, initial encounter: Secondary | ICD-10-CM | POA: Diagnosis not present

## 2021-07-26 DIAGNOSIS — Z5111 Encounter for antineoplastic chemotherapy: Secondary | ICD-10-CM

## 2021-07-26 DIAGNOSIS — C61 Malignant neoplasm of prostate: Secondary | ICD-10-CM

## 2021-07-26 DIAGNOSIS — G8929 Other chronic pain: Secondary | ICD-10-CM

## 2021-07-26 DIAGNOSIS — Z9481 Bone marrow transplant status: Secondary | ICD-10-CM | POA: Diagnosis not present

## 2021-07-26 DIAGNOSIS — M542 Cervicalgia: Secondary | ICD-10-CM | POA: Diagnosis not present

## 2021-07-26 DIAGNOSIS — Z79899 Other long term (current) drug therapy: Secondary | ICD-10-CM | POA: Diagnosis not present

## 2021-07-26 DIAGNOSIS — M8580 Other specified disorders of bone density and structure, unspecified site: Secondary | ICD-10-CM | POA: Diagnosis not present

## 2021-07-26 DIAGNOSIS — Z125 Encounter for screening for malignant neoplasm of prostate: Secondary | ICD-10-CM | POA: Diagnosis not present

## 2021-07-26 DIAGNOSIS — Z7982 Long term (current) use of aspirin: Secondary | ICD-10-CM | POA: Diagnosis not present

## 2021-07-26 DIAGNOSIS — N1831 Chronic kidney disease, stage 3a: Secondary | ICD-10-CM

## 2021-07-26 DIAGNOSIS — C9002 Multiple myeloma in relapse: Secondary | ICD-10-CM | POA: Diagnosis present

## 2021-07-26 LAB — CBC WITH DIFFERENTIAL/PLATELET
Abs Immature Granulocytes: 0.01 10*3/uL (ref 0.00–0.07)
Basophils Absolute: 0 10*3/uL (ref 0.0–0.1)
Basophils Relative: 1 %
Eosinophils Absolute: 0.2 10*3/uL (ref 0.0–0.5)
Eosinophils Relative: 5 %
HCT: 34.1 % — ABNORMAL LOW (ref 39.0–52.0)
Hemoglobin: 11.4 g/dL — ABNORMAL LOW (ref 13.0–17.0)
Immature Granulocytes: 0 %
Lymphocytes Relative: 37 %
Lymphs Abs: 1.3 10*3/uL (ref 0.7–4.0)
MCH: 34.5 pg — ABNORMAL HIGH (ref 26.0–34.0)
MCHC: 33.4 g/dL (ref 30.0–36.0)
MCV: 103.3 fL — ABNORMAL HIGH (ref 80.0–100.0)
Monocytes Absolute: 0.4 10*3/uL (ref 0.1–1.0)
Monocytes Relative: 12 %
Neutro Abs: 1.6 10*3/uL — ABNORMAL LOW (ref 1.7–7.7)
Neutrophils Relative %: 45 %
Platelets: 105 10*3/uL — ABNORMAL LOW (ref 150–400)
RBC: 3.3 MIL/uL — ABNORMAL LOW (ref 4.22–5.81)
RDW: 13.9 % (ref 11.5–15.5)
WBC: 3.6 10*3/uL — ABNORMAL LOW (ref 4.0–10.5)
nRBC: 0 % (ref 0.0–0.2)

## 2021-07-26 LAB — LIPID PANEL
Cholesterol: 155 mg/dL (ref 0–200)
HDL: 39 mg/dL — ABNORMAL LOW (ref >40)
LDL Cholesterol: 90 mg/dL (ref 0–99)
Total CHOL/HDL Ratio: 4 RATIO
Triglycerides: 131 mg/dL (ref <150)
VLDL: 26 mg/dL (ref 0–40)

## 2021-07-26 LAB — COMPREHENSIVE METABOLIC PANEL
ALT: 19 U/L (ref 0–44)
AST: 16 U/L (ref 15–41)
Albumin: 3.9 g/dL (ref 3.5–5.0)
Alkaline Phosphatase: 62 U/L (ref 38–126)
Anion gap: 7 (ref 5–15)
BUN: 18 mg/dL (ref 8–23)
CO2: 25 mmol/L (ref 22–32)
Calcium: 8.5 mg/dL — ABNORMAL LOW (ref 8.9–10.3)
Chloride: 105 mmol/L (ref 98–111)
Creatinine, Ser: 1.29 mg/dL — ABNORMAL HIGH (ref 0.61–1.24)
GFR, Estimated: >60 mL/min (ref >60)
Glucose, Bld: 103 mg/dL — ABNORMAL HIGH (ref 70–99)
Potassium: 4.1 mmol/L (ref 3.5–5.1)
Sodium: 137 mmol/L (ref 135–145)
Total Bilirubin: 0.3 mg/dL (ref 0.3–1.2)
Total Protein: 6.4 g/dL — ABNORMAL LOW (ref 6.5–8.1)

## 2021-07-26 LAB — PSA: Prostatic Specific Antigen: 11.93 ng/mL — ABNORMAL HIGH (ref 0.00–4.00)

## 2021-07-26 MED ORDER — DARATUMUMAB-HYALURONIDASE-FIHJ 1800-30000 MG-UT/15ML ~~LOC~~ SOLN
1800.0000 mg | Freq: Once | SUBCUTANEOUS | Status: AC
  Administered 2021-07-26: 1800 mg via SUBCUTANEOUS
  Filled 2021-07-26: qty 15

## 2021-07-26 MED ORDER — DEXAMETHASONE 4 MG PO TABS
20.0000 mg | ORAL_TABLET | Freq: Once | ORAL | Status: AC
  Administered 2021-07-26: 20 mg via ORAL
  Filled 2021-07-26: qty 5

## 2021-07-26 MED ORDER — MONTELUKAST SODIUM 10 MG PO TABS
10.0000 mg | ORAL_TABLET | Freq: Once | ORAL | Status: AC
  Administered 2021-07-26: 10 mg via ORAL
  Filled 2021-07-26: qty 1

## 2021-07-26 MED ORDER — DIPHENHYDRAMINE HCL 25 MG PO CAPS
50.0000 mg | ORAL_CAPSULE | Freq: Once | ORAL | Status: AC
  Administered 2021-07-26: 50 mg via ORAL
  Filled 2021-07-26: qty 2

## 2021-07-26 MED ORDER — ACETAMINOPHEN 325 MG PO TABS
650.0000 mg | ORAL_TABLET | Freq: Once | ORAL | Status: AC
  Administered 2021-07-26: 650 mg via ORAL
  Filled 2021-07-26: qty 2

## 2021-07-26 NOTE — Assessment & Plan Note (Signed)
Advance directive-wife Cyril Mourning designated if patient were incapacitated.

## 2021-07-26 NOTE — Assessment & Plan Note (Signed)
Continue metoprolol, hydralazine, amlodipine.  He will update me as needed.

## 2021-07-26 NOTE — Assessment & Plan Note (Signed)
Continue sildenafil with routine cautions and update me as needed.

## 2021-07-26 NOTE — Assessment & Plan Note (Signed)
Per hematology.  I will defer.  He agrees.

## 2021-07-26 NOTE — Assessment & Plan Note (Signed)
Continue pravastatin 

## 2021-07-26 NOTE — Patient Instructions (Signed)
[facility]  Discharge Instructions: Thank you for choosing [facility] to provide your oncology and hematology care.  If you have a lab appointment with the [facility], please go directly to the [facility] and check in at the registration area.  Wear comfortable clothing and clothing appropriate for easy access to any Portacath or PICC line.   We strive to give you quality time with your provider. You may need to reschedule your appointment if you arrive late (15 or more minutes).  Arriving late affects you and other patients whose appointments are after yours.  Also, if you miss three or more appointments without notifying the office, you may be dismissed from the clinic at the provider's discretion.      For prescription refill requests, have your pharmacy contact our office and allow 72 hours for refills to be completed.    Today you received the following chemotherapy and/or immunotherapy agents Darzalex Faspro      To help prevent nausea and vomiting after your treatment, we encourage you to take your nausea medication as directed.  BELOW ARE SYMPTOMS THAT SHOULD BE REPORTED IMMEDIATELY: *FEVER GREATER THAN 100.4 F (38 C) OR HIGHER *CHILLS OR SWEATING *NAUSEA AND VOMITING THAT IS NOT CONTROLLED WITH YOUR NAUSEA MEDICATION *UNUSUAL SHORTNESS OF BREATH *UNUSUAL BRUISING OR BLEEDING *URINARY PROBLEMS (pain or burning when urinating, or frequent urination) *BOWEL PROBLEMS (unusual diarrhea, constipation, pain near the anus) TENDERNESS IN MOUTH AND THROAT WITH OR WITHOUT PRESENCE OF ULCERS (sore throat, sores in mouth, or a toothache) UNUSUAL RASH, SWELLING OR PAIN  UNUSUAL VAGINAL DISCHARGE OR ITCHING   Items with * indicate a potential emergency and should be followed up as soon as possible or go to the Emergency Department if any problems should occur.  Please show the CHEMOTHERAPY ALERT CARD or IMMUNOTHERAPY ALERT CARD at  check-in to the Emergency Department and triage nurse.  Should you have questions after your visit or need to cancel or reschedule your appointment, please contact [facility]  [phone] and follow the prompts.  Office hours are 8:00 a.m. to 4:30 p.m. Monday - Friday. Please note that voicemails left after 4:00 p.m. may not be returned until the following business day.  We are closed weekends and major holidays. You have access to a nurse at all times for urgent questions. Please call the main number to the clinic [phone] and follow the prompts.  For any non-urgent questions, you may also contact your provider using MyChart. We now offer e-Visits for anyone 52 and older to request care online for non-urgent symptoms. For details visit [email or web address].   Also download the MyChart app! Go to the app store, search "MyChart", open the app, select [facility], and log in with your MyChart username and password.  Due to Covid, a mask is required upon entering the hospital/clinic. If you do not have a mask, one will be given to you upon arrival. For doctor visits, patients may have 1 support person aged 28 or older with them. For treatment visits, patients cannot have anyone with them due to current Covid guidelines and our immunocompromised population. Daratumumab; Hyaluronidase Injection What is this medication? DARATUMUMAB; HYALURONIDASE (dar a toom ue mab / hye al ur ON i dase) is a monoclonal antibody. Hyaluronidase is used to improve the effects of daratumumab. It treats certain types of cancer. Some of the cancers treated are multiple myeloma and light-chain amyloidosis. This medicine may be used  for other purposes; ask your health care provider or pharmacist if you have questions. COMMON BRAND NAME(S): DARZALEX FASPRO What should I tell my care team before I take this medication? They need to know if you have any of these conditions: heart  disease infection especially a viral infection such as chickenpox, cold sores, herpes, or hepatitis B lung or breathing disease an unusual or allergic reaction to daratumumab, hyaluronidase, other medicines, foods, dyes, or preservatives pregnant or trying to get pregnant breast-feeding How should I use this medication? This medicine is for injection under the skin. It is given by a health care professional in a hospital or clinic setting. Talk to your pediatrician regarding the use of this medicine in children. Special care may be needed. Overdosage: If you think you have taken too much of this medicine contact a poison control center or emergency room at once. NOTE: This medicine is only for you. Do not share this medicine with others. What if I miss a dose? Keep appointments for follow-up doses as directed. It is important not to miss your dose. Call your doctor or health care professional if you are unable to keep an appointment. What may interact with this medication? Interactions have not been studied. This list may not describe all possible interactions. Give your health care provider a list of all the medicines, herbs, non-prescription drugs, or dietary supplements you use. Also tell them if you smoke, drink alcohol, or use illegal drugs. Some items may interact with your medicine. What should I watch for while using this medication? Your condition will be monitored carefully while you are receiving this medicine. This medicine can cause serious allergic reactions. To reduce your risk, your health care provider may give you other medicine to take before receiving this one. Be sure to follow the directions from your health care provider. This medicine can affect the results of blood tests to match your blood type. These changes can last for up to 6 months after the final dose. Your healthcare provider will do blood tests to match your blood type before you start treatment. Tell all of your  healthcare providers that you are being treated with this medicine before receiving a blood transfusion. This medicine can affect the results of some tests used to determine treatment response; extra tests may be needed to evaluate response. Do not become pregnant while taking this medicine or for 3 months after stopping it. Women should inform their health care provider if they wish to become pregnant or think they might be pregnant. There is a potential for serious side effects to an unborn child. Talk to your health care provider for more information. Do not breast-feed an infant while taking this medicine. What side effects may I notice from receiving this medication? Side effects that you should report to your doctor or health care professional as soon as possible: allergic reactions like skin rash, itching or hives, swelling of the face, lips, or tongue blurred vision breathing problems chest pain cough dizziness fast heartbeat feeling faint or lightheaded headache low blood counts - this medicine may decrease the number of white blood cells, red blood cells and platelets. You may be at increased risk for infections and bleeding. nausea, vomiting shortness of breath signs of decreased platelets or bleeding - bruising, pinpoint red spots on the skin, black, tarry stools, blood in the urine signs of decreased red blood cells - unusually weak or tired, feeling faint or lightheaded, falls signs of infection - fever or chills,  cough, sore throat, pain or difficulty passing urine Side effects that usually do not require medical attention (report these to your doctor or health care professional if they continue or are bothersome): back pain constipation diarrhea pain, tingling, numbness in the hands or feet muscle cramps swelling of the ankles, feet, hands tiredness trouble sleeping This list may not describe all possible side effects. Call your doctor for medical advice about side  effects. You may report side effects to FDA at 1-800-FDA-1088. Where should I keep my medication? This drug is given in a hospital or clinic and will not be stored at home. NOTE: This sheet is a summary. It may not cover all possible information. If you have questions about this medicine, talk to your doctor, pharmacist, or health care provider.  2022 [employer] Standard (2020-11-09 12:51:54)

## 2021-07-26 NOTE — Progress Notes (Signed)
Pt reports n/v/d 3 nights ago but has since resolved. Pt reports rib pain d/t vomiting episodes. Denies any fever during this time. Pt also wondering if it is ok to get his routine teeth cleaning at this dentist. No other concerns at this time.

## 2021-07-26 NOTE — Assessment & Plan Note (Signed)
Flu deferred today, he can get this done later this fall. Shingles previously done PNA up-to-date Tetanus 2019 COVID-vaccine previously done. Colonoscopy up-to-date Prostate cancer screening pending 2022 Advance directive-wife Cyril Mourning designated if patient were incapacitated. Cognitive function addressed- see scanned forms- and if abnormal then additional documentation follows.

## 2021-07-26 NOTE — Progress Notes (Signed)
Hematology/Oncology  Follow up note [facility] [phone] [phone]) [phone]   Patient Care Team: Tonia Ghent, MD as PCP - General (Family Medicine) Earlie Server, MD as Consulting Physician (Oncology) Beather Arbour Lilyan Punt, MD as Referring Physician (Hematology and Oncology) Diannia Ruder, MD as Referring Physician (Hematology and Oncology)  REASON FOR VISIT Follow up for multiple myeloma.   HISTORY OF PRESENTING ILLNESS:  Luis Burke is a  65 y.o.  male with PMH listed below presents for follow up of multiple myeloma.  # 07/27/2018 multiple myeloma panel showed M protein of 0.1, IgG 662, IgA 49, IgM 7. Patient was called back to further labs done. 08/10/2018, free light chain ratio showed extremely high level of kappa free light chain 10,183, with a kappa lambda light chain ratio of 1414.31 LDH 164 Beta-2 microglobulin 5 Patient was called and discuss about results.  He was recommended to undergo bone marrow biopsy and PET scan. 08/19/2018 bone marrow biopsy showed hypercellular marrow 80%, involved by plasma cell neoplasm up to 95%.  Consistent with plasma cell myeloma. Myeloma FISH  gain of gain of CEP12  09/10/2018 skeletal survey showed questionable small lucent lesions within the midshaft of the humerus bilaterally.  Otherwise no suspicious focal lytic lesion or acute bone abnormality. 08/25/2018 PET scan showed no definite hypermetabolic bone disease but CT findings are highly suspicious for numerous small myelomatous lesions involving the spine, sternum and scattered ribs.  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 He received preparative regimen with melphalan 200 mg/m on 04/22/2019 followed by autologous stem cell infusion on 04/23/2019. Currently he is transfusion independent.  Transfusion criteria with as needed hemoglobin less than 7.5 for platelet less than 10,000.  Transplant course was complicated with  febrile neutropenia grade 3, treated with vancomycin and cephapirin until engraftment on 05/06/2019. Patient also had engraftment syndrome and had to be started on Solu-Medrol 25 mg twice daily on 05/05/2019.  Transition to Medrol Dosepak on 05/07/2019 to complete steroid taper as outpatient.  Patient is currently on acyclovir for 1 year after transplant, dose was reduced on 722 11/20/2018 due to renal function. Patient had a baseline CKD with creatinine running between 1.4-1.6.  He had acute on chronic kidney injury and creatinine went up to 2.2 on 722.  Fluid hydration was given and creatinine decreased to baseline 1.5-1.7. His Hickman catheter was removed.  Candida Cruris treated with topical clotrimazole 1% 3 times daily and to resolution..  #  seen by Dr. Tonia Brooms BMT team on 06/30/2019.revaccination at [facility] begins at 12 months post transplant I discussed with [facility] hematology Dr.Choi and he recommends plans as listed below.  -patient to be started on Ixazomib maintenance: -Ixazomib 77m on D1/D8/D15 out of 28 day cycle.  -patient will start re-vaccination 12 months post transplant.  -Post transplant bone marrow biopsy if clinically indicated.  # seen by [facility] bone marrow transplant team in June 2021.  Patient has been started on immunization protocol.  He reports no new complaints.  #[facility]  Lucent lesion in the L4 spinous process measuring 4 mm which is  nonspecific. Consider confirmation of marrow replacing process with MRI.  2.  Incompletely characterized renal cysts   # He continues to have chronic back pain and neck pain.  [facility] MRI lumbar spine with and without contrast showed no evidence of spinal metastasis.  Lumbar spondylosis is most pronounced  at L5-S1 where there is severe right and moderate left neural foraminal stenosis. 08/11/2020, x-ray cervical spine complete with flexion and extension showed The cervical spine  is visualized from C1 to the top of T1 on the lateral  view.  Reversal of the normal cervical lordosis with a focal kyphosis centered at the C5-C6 intervertebral level. Trace anterolisthesis of C4 onto C5. Trace retrolisthesis of C6 on C7. No evidence of dynamic instability is noted on  the flexion or extension views.   No prevertebral soft tissue swelling. Cervical vertebral body heights are maintained. Multilevel degenerative changes of the cervical spine including discogenic disease, most pronounced at C5-C6 and C6-C7, and facet arthropathy most pronounced at C4-C5. Multilevel mild to moderate left neural foraminal osseous narrowing of the mid to lower cervical vertebrae, possibly centrally/artifactual by patient positioning.  #07/28/2020 bone marrow normocellular marrow with polytypic plasmacytosis, 3 to 8%   Patient is in remission.  Cytogenetics negative for trisomy 12.  Negative myeloma FISH panel.   # Lumbar lesion,  11/13/20 [facility]  MRI cervical sine w/wo contrast - no evidence of metastatic disease C3-4 severe spinal stenosis, left neural foraminal narrowing. C4-5 spinal canal stenosis. [facility] MRI results showed no metastatic spine lesion. Chronic back pain and neck pain, images are consistent with chronic degenerative disease.  Patient continues to follow-up with [place] orthopedic surgeon.  04/03/2021, light chain ratio increased to 3.76.   05/15/2021 bone marrow biopsy showed slightly hypercellular bone marrow for age with slight plasmacytosis.  5% plasma cells with kappa light chain restriction.  Cytogenetics normal.  Myeloma FISH negative # 05/15/2021 bone marrow biopsy showed slightly hypercellular bone marrow for age with slight plasmacytosis.  5% plasma cells with kappa light chain restriction.  Cytogenetics normal.  Myeloma FISH negative  # 05/31/2021 Daratumumab -Pomalyst-dexamethasone-D1 patient had grade 2 infusion reaction to daratumumab and received steroids, Benadryl, famotidine.  He was  able to finish treatment.D8 was able to finish Daratumumab with no infusion reactions.  INTERVAL HISTORY OJANI BERENSON is a 65 y.o. male who has above history reviewed by me today for follow-up  Currently on Daratumumab Pomalyst dexamethasone 07/19/2021 XR cervical spine showed DJD.  He has one episode of nausea vomiting diarrhea a few days ago, he took antiemetics, and all symptoms resolved.  Today, no fever, nausea, vomiting, diarrhea.     Review of Systems  Constitutional:  Negative for appetite change, chills, fatigue, fever and unexpected weight change.  HENT:   Negative for hearing loss and voice change.   Eyes:  Negative for eye problems and icterus.  Respiratory:  Negative for chest tightness, cough and shortness of breath.   Cardiovascular:  Negative for chest pain and leg swelling.  Gastrointestinal:  Negative for abdominal distention and abdominal pain.  Endocrine: Negative for hot flashes.  Genitourinary:  Negative for difficulty urinating, dysuria and frequency.   Musculoskeletal:  Positive for back pain and neck pain. Negative for arthralgias.  Skin:  Negative for itching and rash.  Neurological:  Negative for light-headedness and numbness.  Hematological:  Negative for adenopathy. Does not bruise/bleed easily.  Psychiatric/Behavioral:  Negative for confusion.    MEDICAL HISTORY:  Past Medical History:  Diagnosis Date   AKI (acute kidney injury) ([place]) 01/06/2019   Anemia    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[place])    autologous stem cell bone marrow transplant on 04/23/2019.   Fatty liver    on u/s 07/2018   Hyperlipidemia 03/2004   Hypertension 1994   Multiple myeloma ([place])  Snores     SURGICAL HISTORY: Past Surgical History:  Procedure Laterality Date   CATARACT EXTRACTION W/PHACO Right 03/04/2018   Procedure: CATARACT EXTRACTION PHACO AND INTRAOCULAR LENS PLACEMENT ([place])  RIGHT TORIC;  Surgeon: Leandrew Koyanagi, MD;  Location: [place];   Service: Ophthalmology;  Laterality: Right;  [place] no Toric Lens 1:45 5.3   COLONOSCOPY WITH PROPOFOL N/A 07/20/2018   Procedure: COLONOSCOPY WITH PROPOFOL;  Surgeon: Jonathon Bellows, MD;  Location: [facility] ENDOSCOPY;  Service: Gastroenterology;  Laterality: N/A;   ESOPHAGOGASTRODUODENOSCOPY (EGD) WITH PROPOFOL N/A 07/20/2018   Procedure: ESOPHAGOGASTRODUODENOSCOPY (EGD) WITH PROPOFOL;  Surgeon: Jonathon Bellows, MD;  Location: [facility] ENDOSCOPY;  Service: Gastroenterology;  Laterality: N/A;   EYE SURGERY Right    HERNIA REPAIR     L Lap herniorraphy  04/2000   Left wrist ganglionectomy      SOCIAL HISTORY: Social History   Socioeconomic History   Marital status: Single    Spouse name: Not on file   Number of children: 1   Years of education: Not on file   Highest education level: Not on file  Occupational History   Occupation: [employer]  Tobacco Use   Smoking status: Never   Smokeless tobacco: Never   Tobacco comments:    Occassionally  Vaping Use   Vaping Use: Never used  Substance and Sexual Activity   Alcohol use: Not Currently    Alcohol/week: 3.0 standard drinks    Types: 3 Cans of beer per week   Drug use: No   Sexual activity: Not on file  Other Topics Concern   Not on file  Social History Narrative   Divorced 2009, married 2021/06/08.     His daughter died 4 days after giving birth to the patient's granddaughter   Has joint custody of his deceased daughter's child   Worked at [employer] is [place], retired 2020.     Social Determinants of Health   Financial Resource Strain: Not on file  Food Insecurity: Not on file  Transportation Needs: Not on file  Physical Activity: Not on file  Stress: Not on file  Social Connections: Not on file  Intimate Partner Violence: Not on file    FAMILY HISTORY: Family History  Problem Relation Age of Onset   Hypertension Mother    Diabetes Mother    Heart disease Mother        CAD   Dementia Mother    Prostate cancer  Father    Hypertension Father    Heart disease Father        MI 02/03   Colon cancer Father    Hypertension Sister    Hypertension Sister    Hypertension Sister     ALLERGIES:  is allergic to bactrim [sulfamethoxazole-trimethoprim], clonidine derivatives, and tadalafil.  MEDICATIONS:  Current Outpatient Medications  Medication Sig Dispense Refill   acetaminophen (TYLENOL) 325 MG tablet Take 2 tablets (650 mg total) by mouth See admin instructions. 1 hour prior to chemotherapy treatments 30 tablet 0   amLODipine (NORVASC) 5 MG tablet TAKE 2 TABLETS BY MOUTH EVERY DAY 180 tablet 1   ASPIRIN 81 PO Take 81 mg by mouth daily.     B Complex Vitamins (VITAMIN B COMPLEX PO) SMARTSIG:By Mouth     chlorhexidine (PERIDEX) 0.12 % solution Use as directed 15 mLs in the mouth or throat 2 (two) times daily. 473 mL 1   clopidogrel (PLAVIX) 75 MG tablet Take by mouth.     co-enzyme Q-10 50 MG  capsule Take 50 mg by mouth daily.     [employer] VITAMIN B12 1000 MCG tablet TAKE 1 TABLET BY MOUTH EVERY DAY 90 tablet 3   cyclobenzaprine (FLEXERIL) 5 MG tablet Take 1 tablet (5 mg total) by mouth daily as needed for muscle spasms. 30 tablet 0   dexamethasone (DECADRON) 4 MG tablet Take 5 tablets (20 mg total) by mouth once a week. Take at least 1 hour prior to infusion appt. Take with food. To start on 06/07/21. 20 tablet 1   diphenhydrAMINE (BENADRYL) 50 MG tablet Take 1 tablet (50 mg total) by mouth See admin instructions. Take 1 tablet 1 hour prior to chemotherapy treatments. 30 tablet 0   hydrALAZINE (APRESOLINE) 10 MG tablet TAKE 1 TABLET (10 MG TOTAL) BY MOUTH 3 (THREE) TIMES DAILY. 270 tablet 1   lisinopril (ZESTRIL) 20 MG tablet Take 1 tablet (20 mg total) by mouth daily. Please keep appt on 07/05/21 90 tablet 0   loperamide (IMODIUM) 2 MG capsule Take 1 capsule (2 mg total) by mouth See admin instructions. Take 2 capsules at the onset of diarrhea, then 1 capsule every 2 hours or after every loose bowel movement.  Maximum 8 capsules per 24 hours. 60 capsule 0   LORazepam (ATIVAN) 1 MG tablet TAKE 1 TABLET (1 MG TOTAL) BY MOUTH EVERY 6 (SIX) HOURS AS NEEDED FOR ANXIETY (FOR NAUSEA)     magnesium chloride (SLOW-MAG) 64 MG TBEC SR tablet Take 1 tablet (64 mg total) by mouth daily. 60 tablet 0   metoprolol succinate (TOPROL-XL) 50 MG 24 hr tablet TAKE 4 TABLETS (200 MG TOTAL) BY MOUTH DAILY. TAKE WITH OR IMMEDIATELY FOLLOWING A MEAL. 360 tablet 2   montelukast (SINGULAIR) 10 MG tablet Take 1 tablet (10 mg total) by mouth See admin instructions. Take 1day prior to chemotherapy 30 tablet 1   ondansetron (ZOFRAN) 8 MG tablet Take 1 tablet (8 mg total) by mouth every 8 (eight) hours as needed. for nausea 30 tablet 1   pomalidomide (POMALYST) 2 MG capsule Take 1 capsule (2 mg total) by mouth daily. Take for 21 days on, then hold for 7 days. Repeat every 28 days. 21 capsule 0   pravastatin (PRAVACHOL) 10 MG tablet Take 1 tablet (10 mg total) by mouth daily. 90 tablet 3   prochlorperazine (COMPAZINE) 10 MG tablet TAKE 1 TABLET (10 MG TOTAL) BY MOUTH EVERY 6 (SIX) HOURS AS NEEDED FOR NAUSEA     sildenafil (REVATIO) 20 MG tablet Take 1-5 tablets (20-100 mg total) by mouth daily as needed. 50 tablet 12   zinc gluconate 50 MG tablet Take 50 mg by mouth daily.     No current facility-administered medications for this visit.     PHYSICAL EXAMINATION: ECOG PERFORMANCE STATUS: 1 - Symptomatic but completely ambulatory Vitals:   07/26/21 0827  BP: 124/75  Pulse: 62  Resp: 16  Temp: 97.7 F (36.5 C)  SpO2: 98%   Filed Weights   07/26/21 0827  Weight: 208 lb 8 oz (94.6 kg)    Physical Exam Constitutional:      General: He is not in acute distress. HENT:     Head: Normocephalic and atraumatic.  Eyes:     General: No scleral icterus. Cardiovascular:     Rate and Rhythm: Normal rate and regular rhythm.     Heart sounds: Normal heart sounds.  Pulmonary:     Effort: Pulmonary effort is normal. No respiratory  distress.     Breath sounds: No wheezing.  Abdominal:     General: Bowel sounds are normal. There is no distension.     Palpations: Abdomen is soft.  Musculoskeletal:        General: No deformity. Normal range of motion.     Cervical back: Normal range of motion and neck supple.  Skin:    General: Skin is warm and dry.     Findings: No erythema or rash.  Neurological:     Mental Status: He is alert and oriented to person, place, and time. Mental status is at baseline.     Cranial Nerves: No cranial nerve deficit.     Coordination: Coordination normal.  Psychiatric:        Mood and Affect: Mood normal.     LABORATORY DATA:  I have reviewed the data as listed Lab Results  Component Value Date   WBC 3.6 (L) 07/26/2021   HGB 11.4 (L) 07/26/2021   HCT 34.1 (L) 07/26/2021   MCV 103.3 (H) 07/26/2021   PLT 105 (L) 07/26/2021   Recent Labs    07/03/21 1128 07/12/21 0935 07/19/21 0800  NA 137 137 136  K 4.8 4.6 4.2  CL 100 99 105  CO2 $Re'29 29 24  'gHH$ GLUCOSE 99 96 106*  BUN 22 20 25*  CREATININE 1.50* 1.36* 1.22  CALCIUM 9.9 10.1 8.6*  GFRNONAA 51* 58* >60  PROT 7.1 7.0 6.9  ALBUMIN 4.2 4.5 3.9  AST 18 18 14*  ALT 25 62* 23  ALKPHOS 59 76 69  BILITOT 0.8 0.9 0.6    Iron/TIBC/Ferritin/ %Sat    Component Value Date/Time   IRON 74 07/01/2018 0944   TIBC 250 07/01/2018 0944   FERRITIN 357 07/01/2018 0944   IRONPCTSAT 30 07/01/2018 0944    Lab Results  Component Value Date   TOTALPROTELP 6.7 06/26/2021   ALBUMINELP 4.0 03/08/2021   A1GS 0.2 03/08/2021   A2GS 0.7 03/08/2021   BETS 1.1 03/08/2021   GAMS 1.1 03/08/2021   MSPIKE Not Observed 03/08/2021   SPEI Comment 03/08/2021   Lab Results  Component Value Date   KPAFRELGTCHN 28.5 (H) 06/26/2021   LAMBDASER 10.0 06/26/2021   KAPLAMBRATIO 2.85 (H) 06/26/2021    RADIOGRAPHIC STUDIES: I have personally reviewed the radiological images as listed and agreed with the findings in the report. DG Cervical Spine  Complete  Result Date: 07/21/2021 CLINICAL DATA:  Multiple myeloma Neck pain for few weeks EXAM: CERVICAL SPINE - COMPLETE 4+ VIEW COMPARISON:  08/10/2019 FINDINGS: There is reversal of normal cervical lordosis with apex at C5. There is minimal retrolisthesis C5 on C6. No significant vertebral body height loss. Moderate disc height loss and endplate degenerative changes seen at C5-C6. Mild disc height loss and endplate spurring seen at C6-C7. Severe right neural foraminal stenosis at C3-C4. Mild right neural foraminal stenosis at C5-C6 and C6-C7. Moderate left neural foraminal stenosis at C4-C5 and C5-C6. Mild left neural foraminal stenosis at C3-C4 and C6-C7. is Facet degenerative changes seen throughout the cervical spine. Minimal atheromatous plaque noted in the region the carotid bulbs. IMPRESSION: Multilevel degenerative changes of the cervical spine as above. Electronically Signed   By: Miachel Roux M.D.   On: 07/21/2021 09:13   MR Lumbar Spine W Wo Contrast  Result Date: 06/19/2021 CLINICAL DATA:  Back pain, multiple myeloma EXAM: MRI LUMBAR SPINE WITHOUT AND WITH CONTRAST TECHNIQUE: Multiplanar and multiecho pulse sequences of the lumbar spine were obtained without and with intravenous contrast. CONTRAST:  58mL GADAVIST GADOBUTROL 1 MMOL/ML IV SOLN  COMPARISON:  None. FINDINGS: Segmentation:  Standard. Alignment:  No significant listhesis. Vertebrae: Vertebral body heights are maintained. No substantial marrow edema. No suspicious osseous lesion. Conus medullaris and cauda equina: Conus extends to the T12-L1 level. Conus and cauda equina appear normal. No abnormal intrathecal enhancement. Paraspinal and other soft tissues: Probable bilateral renal cysts partially imaged. Disc levels: L1-L2:  No canal or foraminal stenosis. L2-L3:  No canal or foraminal stenosis. L3-L4:  Disc bulge.  No canal or foraminal stenosis. L4-L5: Disc bulge. Central and left subarticular annular fissure. No canal or foraminal  stenosis. L5-S1: Disc bulge with endplate osteophytic ridging. Facet arthropathy. No canal stenosis. Moderate right and mild left foraminal stenosis. Potential exiting right L5 nerve root compression. IMPRESSION: No evidence of myelomatous involvement. No compression fracture. Mild lower lumbar degenerative changes. There is right foraminal narrowing at L5-S1 with potential exiting nerve root compression. Electronically Signed   By: Macy Mis M.D.   On: 06/19/2021 12:20   CT BONE MARROW BIOPSY & ASPIRATION  Result Date: 05/15/2021 CLINICAL DATA:  History of multiple myeloma with possible relapse due to increased light chains. Need for bone marrow biopsy for further evaluation. EXAM: CT GUIDED BONE MARROW ASPIRATION AND BIOPSY ANESTHESIA/SEDATION: Versed 2.0 mg IV, Fentanyl 100 mcg IV Total Moderate Sedation Time:   13 minutes. The patient's level of consciousness and physiologic status were continuously monitored during the procedure by Radiology nursing. PROCEDURE: The procedure risks, benefits, and alternatives were explained to the patient. Questions regarding the procedure were encouraged and answered. The patient understands and consents to the procedure. A time out was performed prior to initiating the procedure. The right gluteal region was prepped with chlorhexidine. Sterile gown and sterile gloves were used for the procedure. Local anesthesia was provided with 1% Lidocaine. Under CT guidance, an 11 gauge On Control bone cutting needle was advanced from a posterior approach into the right iliac bone. Needle positioning was confirmed with CT. Initial non heparinized and heparinized aspirate samples were obtained of bone marrow. Core biopsy was performed via the On Control drill needle. Two separate core biopsy samples were obtained. COMPLICATIONS: None FINDINGS: Inspection of initial aspirate did reveal visible particles. Intact core biopsy samples were obtained. IMPRESSION: CT guided bone marrow  biopsy of right posterior iliac bone with both aspirate and core samples obtained. Electronically Signed   By: Aletta Edouard M.D.   On: 05/15/2021 10:51      ASSESSMENT & PLAN:  1. Encounter for antineoplastic chemotherapy   2. Multiple myeloma in relapse ([place])   3. Thrombocytopenia ([facility])   4. Stage 3a chronic kidney disease ([place])   5. Chemotherapy induced neutropenia ([facility])    #Light chain multiple myeloma beta 2 microglobulin 5 and normal albumin.  Stage II.cytogenetics showed normal male chromosome, MDS FISH panel showed trisomy 22- Standard Risk.S/p RVD x 9 and  Autologous bone marrow stem cell transplant at [facility] on 04/23/2019.' Early relapse multiple myeloma. Labs are reviewed and discussed with patient. Proceed with Cycle 2 D22 Daratumumab. Dexamethasone 20mg  x 1 today Hold pomalyst.   # Chemotherapy induced neutropenia, ANC 1.6 Weekly CBC showed nadir ANC 0.7. plan to start pomalyst next week when he starts cycle 3.   # Thrombocytopenia, monitor closely.  Platelet count 105,000 # CKD, avoid nephrotoxin.  Creatinine stable,   #Neck pain, previous Jan 2022 MRI cervical spine showed DJD.  Check X ray of cervical spine- DJD  Refer to physical therapy.  Flexeril 5mg  daily PRN  Post bone marrow  transplant care He has completed 1 year of acyclovir.  Off acyclovir now. Post transplant vaccination finished at [facility].   #Bone health/osteopenia/multiple myeloma-  Continue Xgeva monthly.- hold today due to hypocalcemia, postpone to next week.  Continue calcium and vitamin D supplementation. Follow up 1 week for Lab MD Daratumumab/Pomalyst  treatment.   Earlie Server, MD, PhD Hematology Oncology [place] at [facility] 07/26/2021

## 2021-07-30 ENCOUNTER — Encounter: Payer: Self-pay | Admitting: Oncology

## 2021-07-30 ENCOUNTER — Encounter: Payer: Self-pay | Admitting: Physical Therapy

## 2021-07-30 ENCOUNTER — Ambulatory Visit: Payer: Medicare Other | Attending: Oncology | Admitting: Physical Therapy

## 2021-07-30 ENCOUNTER — Other Ambulatory Visit: Payer: Self-pay | Admitting: Family Medicine

## 2021-07-30 DIAGNOSIS — M542 Cervicalgia: Secondary | ICD-10-CM | POA: Diagnosis not present

## 2021-07-30 DIAGNOSIS — R972 Elevated prostate specific antigen [PSA]: Secondary | ICD-10-CM

## 2021-07-30 NOTE — Therapy (Signed)
[facility] [address] [place], [address] Phone: [phone]   Fax:  [phone]  Physical Therapy Evaluation  Patient Details  Name: Luis Burke MRN: [id number] Date of Birth: [birth year] No data recorded  Encounter Date: 07/30/2021   PT End of Session - 07/30/21 1104     Visit Number 1    Number of Visits 17    Date for PT Re-Evaluation 09/24/21    Authorization - Visit Number 1    Progress Note Due on Visit 10    PT Start Time 0820    PT Stop Time 0905    PT Time Calculation (min) 45 min    Activity Tolerance Patient tolerated treatment well    Behavior During Therapy [facility] for tasks assessed/performed             Past Medical History:  Diagnosis Date   AKI (acute kidney injury) ([place]) 01/06/2019   Anemia    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   Fatty liver    on u/s 07/2018   Hyperlipidemia 03/2004   Hypertension 1994   Multiple myeloma ([place])    Snores     Past Surgical History:  Procedure Laterality Date   CATARACT EXTRACTION W/PHACO Right 03/04/2018   Procedure: CATARACT EXTRACTION PHACO AND INTRAOCULAR LENS PLACEMENT ([place])  RIGHT TORIC;  Surgeon: Leandrew Koyanagi, MD;  Location: [facility];  Service: Ophthalmology;  Laterality: Right;  [place] no Toric Lens 1:45 5.3   COLONOSCOPY WITH PROPOFOL N/A 07/20/2018   Procedure: COLONOSCOPY WITH PROPOFOL;  Surgeon: Jonathon Bellows, MD;  Location: [facility] ENDOSCOPY;  Service: Gastroenterology;  Laterality: N/A;   ESOPHAGOGASTRODUODENOSCOPY (EGD) WITH PROPOFOL N/A 07/20/2018   Procedure: ESOPHAGOGASTRODUODENOSCOPY (EGD) WITH PROPOFOL;  Surgeon: Jonathon Bellows, MD;  Location: [facility] ENDOSCOPY;  Service: Gastroenterology;  Laterality: N/A;   EYE SURGERY Right    HERNIA REPAIR     L Lap herniorraphy  04/2000   Left wrist ganglionectomy      There were no vitals filed for this  visit.    Subjective Assessment - 07/30/21 0821     Subjective Luis Burke is a 65 year old male with primary c/o cervical pain with L side > R secondary to numbness in the R foot. He reports his neck pain began in 2019 without MOI. He reports difficulty with sitting, lifting, and performing household chores. He reports having a history of repetitive lifitng and now reports having a lifttng restriction of 10 pounds. He states his pain is alleviated with rest, change of position, and the use of salonpas patch. He reports having difficulty sleeping due to pain but it s usually able to reposition and return to sleep. He reports his pain at best is 0/10 and 5-6/10 at worst. Pt denies any unexplained weight fluctuation, saddle paresthesia, loss of bowel/bladder function, or unrelenting night pain at this time. Pt is currently undergoing chemotherapy treatment for multiple myeloma ever Thursday and reports taking a steroid Dexamethosone weekly. Pt has a past medical history of bone marrow transplant is being followed by [facility] oncology. Pt other PMH HTN, hyperlipidemia, lumbar stenosis, and anemia.    Pertinent History Luis Burke is a 65 year old male with primary c/o cervical pain with L side > R secondary to numbness in the R foot. He reports his neck pain began in 2019 without MOI but has experienced radiating pain down L UE.  He reports difficulty with sitting, lifting, and performing household chores. He reports having a history of repetitive lifitng and now reports having a lifttng restriction of 10 pounds. He states his pain is alleviated with rest, change of position, and the use of salonpas patch. He reports having difficulty sleeping due to pain but it s usually able to reposition and return to sleep. He reports his pain at best is 0/10 and 5-6/10 at worst. Pt denies any unexplained weight fluctuation, saddle paresthesia, loss of bowel/bladder function, or unrelenting night pain at this time. Pt is  currently undergoing chemotherapy treatment for multiple myeloma ever Thursday and reports taking a steroid Dexamethosone weekly. Pt has a past medical history of bone marrow transplant is being followed by [facility] oncology. Pt other PMH HTN, hyperlipidemia, lumbar stenosis, and anemia.    Limitations Lifting;Sitting;House hold activities    How long can you sit comfortably? 45 min    How long can you stand comfortably? hours    How long can you walk comfortably? 15-80mn    Diagnostic tests xray-DJD    Patient Stated Goals Reduce radiating symptoms and reduce neck pain    Currently in Pain? No/denies    Pain Location Neck    Pain Orientation Left    Pain Descriptors / Indicators Sharp    Pain Type Chronic pain    Pain Radiating Towards L UE    Pain Onset More than a month ago    Pain Frequency Intermittent    Aggravating Factors  lifting head, sitting    Pain Relieving Factors repositioning, salonpas    Effect of Pain on Daily Activities retired; limits ADLS              Cervical AROM L/R  Flexion: 50 deg  Extension:45 deg  Lateral Flexion: limited approximately 50 % bilaterally   Rotation: 45/45 deg  *No increase in pain with overpressure  PROM restricted in all planes approximately = AROM    Shoulder AROM Screen L/R  Flexion:approximately 165 bilaterally   Abduction: 160/170deg  IIR:[id number]/L5 * limited due to soft tissue restriction  ER: approximately C4/C5  Shoulder Strength L/R  Flexion: 4/5; 4+/5  Abduction:4/5;4/5  IR: 4/5; 4/5  ER: 4/5; 4/5  Cervical PAIVM CPA : C2-T1 hypomobile; moderate pain with wincing and flinching to pressure    Upper Limb Tension Tests  ULTT1: Median Nerve: negative  ULTT2b: Radial Nerve: negative  ULTT3: Ulnar Nerve: negative  Grip Strength: WFL bilaterally    Special Tests  Distraction Test (Radiculopathy): Improved patient concordant pain   Spurling's Test (Radiculopathy): Positive to the L with  concordant pain sign  Neck Flexion Rotation Test: Positive to the L with concordant pain sign present   Palpation: TTP to R levator scapula with mild non-concordant pain; hypertonicity to bilateral upper trapezius    Posture: forward head, bilaterally rounded shoulders, thoracic kyphosis, increased lumbar lordosis  Sensation: Intact bilaterally      Objective measurements completed on examination: See above findings.    Cervical Snags 1 x 12-15 reps  Chin Tucks 1 x 12-15 reps Upper Trap Stretch 3 x 30 sec  PT reviewed the following HEP with patient with patient able to demonstrate a set of the following with min cuing for correction needed. PT educated patient on parameters of therex (how/when to inc/decrease intensity, frequency, rep/set range, stretch hold time, and purpose of therex) with verbalized understanding.       PT Education - 07/30/21 1104     Education  Details Patient was educated on diagnosis, anatomy and pathology involved, prognosis, role of PT, and was given an HEP, demonstrating exercise with proper form following verbal and tactile cues, and was given a paper hand out to continue exercise at home. Pt was educated on and agreed to plan of care.    Person(s) Educated Patient    Methods Explanation;Demonstration    Comprehension Verbalized understanding;Returned demonstration              PT Short Term Goals - 07/30/21 1115       PT SHORT TERM GOAL #1   Title Pt will demonstrate independence with HEP to improve cervical function for increased ability to participate with ADLs    Baseline HEP given    Time 4    Period Weeks    Status New    Target Date 08/27/21               PT Long Term Goals - 07/30/21 1241       PT LONG TERM GOAL #1   Title Patient will increase FOTO score to ** to demonstrate predicted increase in functional mobility to complete ADLs    Baseline 07/30/21:    Time 8    Period Weeks    Status New    Target Date  09/24/21      PT LONG TERM GOAL #2   Title Pt will decrease worst cervical pain as reported on NPRS by at least 2 points in order to demonstrate clinically significant reduction in cercvical pain.    Baseline 07/30/21: 6/10    Time 8    Period Weeks    Status New    Target Date 09/24/21      PT LONG TERM GOAL #3   Title Pt will improve active cervical rotation to 60 degrees and lateral flexion to 45 in order to drive.    Baseline Rotation 45 deg; lateral flexion approximately 20 deg    Time 8    Status New    Target Date 09/24/21                    Plan - 07/30/21 1546     Clinical Impression Statement Pt is a 66 y.o. male presenting with cervical pain with radiating pain L>R UE. Pt demonstrates decreased cervical ROM, hypomobility of cervical spine, posteral dysfunction, and pain. Activity limitations in sitting, lifting, and sleeping). Activity limitations in prolonged sitting, standing, walking, typing, lifting, carrying, and sleeping; inhibiting full participation in ADLS driving, cleaning, and performing community activities. Pt will benefit from skilled PT to achieve set goal and address impairments.    Personal Factors and Comorbidities Age;Time since onset of injury/illness/exacerbation;Comorbidity 2    Comorbidities Multiple myeloma, chemotherapy, obesity    Examination-Activity Limitations Sit;Sleep;Lift    Examination-Participation Restrictions Driving;Cleaning;Community Activity    Stability/Clinical Decision Making Stable/Uncomplicated    Clinical Decision Making Low    Rehab Potential Good    PT Frequency 2x / week    PT Duration 8 weeks    PT Treatment/Interventions ADLs/Self Care Home Management;Cryotherapy;Traction;Therapeutic exercise;Therapeutic activities;Patient/family education;Manual techniques;Passive range of motion;Dry needling;Spinal Manipulations;Joint Manipulations;Neuromuscular re-education;Electrical Stimulation    PT Next Visit Plan review HEP  and reassess soft tissue of the neck    PT Home Exercise Plan chin tucks, Snags, Upper traps stretch             Patient will benefit from skilled therapeutic intervention in order to improve the following deficits and impairments:  Hypomobility, Pain, Postural dysfunction, Decreased range of motion, Obesity  Visit Diagnosis: Neck pain     Problem List Patient Active Problem List   Diagnosis Date Noted   Medicare welcome exam 07/26/2021   Chemotherapy induced neutropenia ([place]) 07/19/2021   Bone lesion 06/05/2021   Chronic midline low back pain without sciatica 06/05/2021   Neck pain, chronic 08/11/2020   Lumbar degenerative disc disease 08/10/2020   Lumbar foraminal stenosis 08/09/2020   Thrombocytopenia ([place]) 07/20/2020   Encounter for antineoplastic chemotherapy 07/20/2020   Stage 3a chronic kidney disease ([place]) 07/20/2020   Productive cough 03/08/2020   Carotid arterial disease ([place]) 11/26/2019   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[place])    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   Leg pain 03/03/2019   AKI (acute kidney injury) ([place]) 01/06/2019   Multiple myeloma ([place]) 08/26/2018   Goals of care, counseling/discussion 08/26/2018   Fatty liver 07/29/2018   Anemia 07/06/2018   Creatinine elevation 07/06/2018   Headache 07/02/2018   Snoring 06/10/2018   FH: colon cancer 06/10/2018   Advance care planning 11/24/2015   Routine general medical examination at a health care facility 04/29/2012   ESSENTIAL HYPERTENSION, BENIGN 01/20/2008   HLD (hyperlipidemia) 06/02/2007   ERECTILE DYSFUNCTION, 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, [place], [address] Phone: [phone]   Fax:  [phone]  Name: Luis Burke MRN: [id number] Date of Birth: Sep 30, 1956

## 2021-07-31 ENCOUNTER — Telehealth: Payer: Self-pay | Admitting: Family Medicine

## 2021-07-31 ENCOUNTER — Encounter: Payer: Self-pay | Admitting: Physical Therapy

## 2021-07-31 NOTE — Telephone Encounter (Signed)
Pt called in wanting to know if should be taking plavix. Pt said that the  provider has not called in prescription for pt. Pt wants to be called regarding this. Pt does not know if he need to fast for blood work for his appointment at [facility].

## 2021-07-31 NOTE — Telephone Encounter (Signed)
I see plavix on his medication list but under historical; should he be taking this? Also not sure what lab work he is talking about so I will clarify that will him when I call him.

## 2021-08-01 ENCOUNTER — Ambulatory Visit: Payer: Medicare Other | Admitting: Physical Therapy

## 2021-08-01 ENCOUNTER — Encounter: Payer: Self-pay | Admitting: Physical Therapy

## 2021-08-01 ENCOUNTER — Telehealth: Payer: Self-pay | Admitting: Family Medicine

## 2021-08-01 DIAGNOSIS — M542 Cervicalgia: Secondary | ICD-10-CM | POA: Diagnosis not present

## 2021-08-01 NOTE — Therapy (Signed)
[facility] [address], [place], [address] Phone: [phone]   Fax:  [phone]  Physical Therapy Treatment  Patient Details  Name: Luis Burke MRN: [id number] Date of Birth: 1956/08/02 No data recorded  Encounter Date: 08/01/2021   PT End of Session - 08/01/21 0737     Visit Number 2    Number of Visits 17    Date for PT Re-Evaluation 09/24/21    Authorization - Visit Number 2    Progress Note Due on Visit 10    PT Start Time 0732    PT Stop Time 0812    PT Time Calculation (min) 40 min    Activity Tolerance Patient tolerated treatment well    Behavior During Therapy [facility] for tasks assessed/performed             Past Medical History:  Diagnosis Date   AKI (acute kidney injury) ([place]) 01/06/2019   Anemia    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   Fatty liver    on u/s 07/2018   Hyperlipidemia 03/2004   Hypertension 1994   Multiple myeloma ([place])    Snores     Past Surgical History:  Procedure Laterality Date   CATARACT EXTRACTION W/PHACO Right 03/04/2018   Procedure: CATARACT EXTRACTION PHACO AND INTRAOCULAR LENS PLACEMENT ([place])  RIGHT TORIC;  Surgeon: Leandrew Koyanagi, MD;  Location: [facility];  Service: Ophthalmology;  Laterality: Right;  [place] no Toric Lens 1:45 5.3   COLONOSCOPY WITH PROPOFOL N/A 07/20/2018   Procedure: COLONOSCOPY WITH PROPOFOL;  Surgeon: Jonathon Bellows, MD;  Location: [facility] ENDOSCOPY;  Service: Gastroenterology;  Laterality: N/A;   ESOPHAGOGASTRODUODENOSCOPY (EGD) WITH PROPOFOL N/A 07/20/2018   Procedure: ESOPHAGOGASTRODUODENOSCOPY (EGD) WITH PROPOFOL;  Surgeon: Jonathon Bellows, MD;  Location: [facility] ENDOSCOPY;  Service: Gastroenterology;  Laterality: N/A;   EYE SURGERY Right    HERNIA REPAIR     L Lap herniorraphy  04/2000   Left wrist ganglionectomy      There were no vitals filed for this  visit.   Subjective Assessment - 08/01/21 0732     Subjective Pt report L sided neck at 4-5/10 on NPRS. He reports active participation in HEP but some difficulty with SNAGS.    Pertinent History Luis Burke is a 65 year old male with primary c/o cervical pain with L side > R secondary to numbness in the R foot. He reports his neck pain began in 2019 without MOI but has experienced radiating pain down L UE. He reports difficulty with sitting, lifting, and performing household chores. He reports having a history of repetitive lifitng and now reports having a lifttng restriction of 10 pounds. He states his pain is alleviated with rest, change of position, and the use of salonpas patch. He reports having difficulty sleeping due to pain but it s usually able to reposition and return to sleep. He reports his pain at best is 0/10 and 5-6/10 at worst. Pt denies any unexplained weight fluctuation, saddle paresthesia, loss of bowel/bladder function, or unrelenting night pain at this time. Pt is currently undergoing chemotherapy treatment for multiple myeloma ever Thursday and reports taking a steroid Dexamethosone weekly. Pt has a past medical history of bone marrow transplant is being followed by [facility] oncology. Pt other PMH HTN, hyperlipidemia, lumbar stenosis, and anemia.    Limitations Lifting;Sitting;House hold activities    How long can you sit comfortably?  45 min    How long can you stand comfortably? hours    How long can you walk comfortably? 15-50mn    Diagnostic tests xray-DJD    Patient Stated Goals Reduce radiating symptoms and reduce neck pain             Therex   Nu Step L1 x 5 min UE only seat 8   Cervical Snag L/R x 15 reps each directions with active demonstration, VC and Tcues to perform correctly with success  Chin Tucks x 15 reps with cues to tilt chin downwards and repeated retraction for with success  Theraband row 3 x 10 reps GTB with cues to perform scapular  retractions  Doorway Stretch 2 x 30 sec  Manual Therapy  Thoracic Segmental Ext T10-T1 3 bouts x 30 sec  Cervical Distraction 3 bouts x 30 sec  Upper Trap Stretch bilat 3 x 30 sec  Levator stretch 3 x 30 sec  *manual therapy performed in supine for increased tissue extensibility and pain modulation.                            PT Education - 08/01/21 0737     Education Details therex form/technique    Person(s) Educated Patient    Methods Explanation;Demonstration    Comprehension Verbalized understanding;Returned demonstration              PT Short Term Goals - 07/30/21 1115       PT SHORT TERM GOAL #1   Title Pt will demonstrate independence with HEP to improve cervical function for increased ability to participate with ADLs    Baseline HEP given    Time 4    Period Weeks    Status New    Target Date 08/27/21               PT Long Term Goals - 07/30/21 1241       PT LONG TERM GOAL #1   Title Patient will increase FOTO score to ** to demonstrate predicted increase in functional mobility to complete ADLs    Baseline 07/30/21:    Time 8    Period Weeks    Status New    Target Date 09/24/21      PT LONG TERM GOAL #2   Title Pt will decrease worst cervical pain as reported on NPRS by at least 2 points in order to demonstrate clinically significant reduction in cercvical pain.    Baseline 07/30/21: 6/10    Time 8    Period Weeks    Status New    Target Date 09/24/21      PT LONG TERM GOAL #3   Title Pt will improve active cervical rotation to 60 degrees and lateral flexion to 45 in order to drive.    Baseline Rotation 45 deg; lateral flexion approximately 20 deg    Time 8    Status New    Target Date 09/24/21                   Plan - 08/01/21 0907     Clinical Impression Statement Pt tolerated session well with reports of decreased cervical pain from 4/10 to 2/10 following initiation of manual therapy and  exercise. Pt utilized multimodal cueing to instruct therex with success. Pt continues to demonstrate decreased cervical ROM, hypomobility, improper posture, and pain. Pt will continue to benefit from skilled therapy to ensure proper technique for  continued safe and effective exercise progression.    Personal Factors and Comorbidities Age;Time since onset of injury/illness/exacerbation;Comorbidity 2    Comorbidities Multiple myeloma, chemotherapy, obesity    Examination-Activity Limitations Sit;Sleep;Lift    Examination-Participation Restrictions Driving;Cleaning;Community Activity    Stability/Clinical Decision Making Stable/Uncomplicated    Clinical Decision Making Low    Rehab Potential Good    PT Frequency 2x / week    PT Duration 8 weeks    PT Treatment/Interventions ADLs/Self Care Home Management;Cryotherapy;Traction;Therapeutic exercise;Therapeutic activities;Patient/family education;Manual techniques;Passive range of motion;Dry needling;Spinal Manipulations;Joint Manipulations;Neuromuscular re-education;Electrical Stimulation    PT Next Visit Plan manual therapy and cervical mobility    PT Home Exercise Plan chin tucks, Snags, Upper traps stretch    Consulted and Agree with Plan of Care Patient             Patient will benefit from skilled therapeutic intervention in order to improve the following deficits and impairments:  Hypomobility, Pain, Postural dysfunction, Decreased range of motion, Obesity  Visit Diagnosis: Neck pain     Problem List Patient Active Problem List   Diagnosis Date Noted   Medicare welcome exam 07/26/2021   Chemotherapy induced neutropenia ([place]) 07/19/2021   Bone lesion 06/05/2021   Chronic midline low back pain without sciatica 06/05/2021   Neck pain, chronic 08/11/2020   Lumbar degenerative disc disease 08/10/2020   Lumbar foraminal stenosis 08/09/2020   Thrombocytopenia ([place]) 07/20/2020   Encounter for antineoplastic chemotherapy 07/20/2020    Stage 3a chronic kidney disease ([place]) 07/20/2020   Productive cough 03/08/2020   Carotid arterial disease ([place]) 11/26/2019   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[place])    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[place]) 04/23/2019   Leg pain 03/03/2019   AKI (acute kidney injury) ([place]) 01/06/2019   Multiple myeloma ([place]) 08/26/2018   Goals of care, counseling/discussion 08/26/2018   Fatty liver 07/29/2018   Anemia 07/06/2018   Creatinine elevation 07/06/2018   Headache 07/02/2018   Snoring 06/10/2018   FH: colon cancer 06/10/2018   Advance care planning 11/24/2015   Routine general medical examination at a health care facility 04/29/2012   ESSENTIAL HYPERTENSION, BENIGN 01/20/2008   HLD (hyperlipidemia) 06/02/2007   ERECTILE DYSFUNCTION, 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 [place], [address] Phone: [phone]   Fax:  [phone]  Name: Luis Burke MRN: [id number] Date of Birth: [birth year]

## 2021-08-01 NOTE — Telephone Encounter (Signed)
I had to check back on his old records.  I would not stop Plavix yet.  This was started by an outside clinic and I will check with oncology in the meantime.  He should not need to fast for his upcoming blood work.  Thanks.

## 2021-08-01 NOTE — Telephone Encounter (Signed)
I need your input.  Patient was asking about continued Plavix use.  It looks like this was started for thromboprophylaxis.  I had not prescribed it previously.  Is it reasonable for the patient to continue it in the meantime?  Many thanks.

## 2021-08-01 NOTE — Telephone Encounter (Signed)
Spoke with patient and he states he has been off of the Plavix about 4-6 months. Does he need to go back on it? And if so he will need a new rx sent in. Advised patient he should not have to fast for his upcoming labs but would call that office doing lab work to verify.

## 2021-08-01 NOTE — Telephone Encounter (Signed)
Pt called back to follow up, he would like a call back today

## 2021-08-02 ENCOUNTER — Other Ambulatory Visit: Payer: Self-pay | Admitting: Family Medicine

## 2021-08-02 ENCOUNTER — Encounter: Payer: Self-pay | Admitting: Oncology

## 2021-08-02 ENCOUNTER — Inpatient Hospital Stay: Payer: Medicare Other

## 2021-08-02 ENCOUNTER — Inpatient Hospital Stay (HOSPITAL_BASED_OUTPATIENT_CLINIC_OR_DEPARTMENT_OTHER): Payer: Medicare Other | Admitting: Oncology

## 2021-08-02 ENCOUNTER — Other Ambulatory Visit: Payer: Self-pay

## 2021-08-02 VITALS — BP 134/85 | HR 61 | Temp 98.8°F | Resp 18 | Wt 208.0 lb

## 2021-08-02 VITALS — BP 129/87 | HR 68 | Temp 97.8°F | Resp 16

## 2021-08-02 DIAGNOSIS — T451X5A Adverse effect of antineoplastic and immunosuppressive drugs, initial encounter: Secondary | ICD-10-CM

## 2021-08-02 DIAGNOSIS — N179 Acute kidney failure, unspecified: Secondary | ICD-10-CM

## 2021-08-02 DIAGNOSIS — C9002 Multiple myeloma in relapse: Secondary | ICD-10-CM

## 2021-08-02 DIAGNOSIS — C9000 Multiple myeloma not having achieved remission: Secondary | ICD-10-CM

## 2021-08-02 DIAGNOSIS — Z5111 Encounter for antineoplastic chemotherapy: Secondary | ICD-10-CM

## 2021-08-02 DIAGNOSIS — Z5112 Encounter for antineoplastic immunotherapy: Secondary | ICD-10-CM | POA: Diagnosis not present

## 2021-08-02 DIAGNOSIS — M542 Cervicalgia: Secondary | ICD-10-CM | POA: Diagnosis not present

## 2021-08-02 DIAGNOSIS — D696 Thrombocytopenia, unspecified: Secondary | ICD-10-CM

## 2021-08-02 DIAGNOSIS — D701 Agranulocytosis secondary to cancer chemotherapy: Secondary | ICD-10-CM

## 2021-08-02 DIAGNOSIS — M79671 Pain in right foot: Secondary | ICD-10-CM

## 2021-08-02 LAB — CBC WITH DIFFERENTIAL/PLATELET
Abs Immature Granulocytes: 0.02 10*3/uL (ref 0.00–0.07)
Basophils Absolute: 0 10*3/uL (ref 0.0–0.1)
Basophils Relative: 1 %
Eosinophils Absolute: 0.2 10*3/uL (ref 0.0–0.5)
Eosinophils Relative: 5 %
HCT: 37.6 % — ABNORMAL LOW (ref 39.0–52.0)
Hemoglobin: 12.5 g/dL — ABNORMAL LOW (ref 13.0–17.0)
Immature Granulocytes: 0 %
Lymphocytes Relative: 34 %
Lymphs Abs: 1.5 10*3/uL (ref 0.7–4.0)
MCH: 34.2 pg — ABNORMAL HIGH (ref 26.0–34.0)
MCHC: 33.2 g/dL (ref 30.0–36.0)
MCV: 103 fL — ABNORMAL HIGH (ref 80.0–100.0)
Monocytes Absolute: 0.5 10*3/uL (ref 0.1–1.0)
Monocytes Relative: 11 %
Neutro Abs: 2.2 10*3/uL (ref 1.7–7.7)
Neutrophils Relative %: 49 %
Platelets: 122 10*3/uL — ABNORMAL LOW (ref 150–400)
RBC: 3.65 MIL/uL — ABNORMAL LOW (ref 4.22–5.81)
RDW: 14.5 % (ref 11.5–15.5)
WBC: 4.5 10*3/uL (ref 4.0–10.5)
nRBC: 0 % (ref 0.0–0.2)

## 2021-08-02 LAB — COMPREHENSIVE METABOLIC PANEL
ALT: 19 U/L (ref 0–44)
AST: 18 U/L (ref 15–41)
Albumin: 4.2 g/dL (ref 3.5–5.0)
Alkaline Phosphatase: 66 U/L (ref 38–126)
Anion gap: 8 (ref 5–15)
BUN: 20 mg/dL (ref 8–23)
CO2: 30 mmol/L (ref 22–32)
Calcium: 10 mg/dL (ref 8.9–10.3)
Chloride: 101 mmol/L (ref 98–111)
Creatinine, Ser: 1.39 mg/dL — ABNORMAL HIGH (ref 0.61–1.24)
GFR, Estimated: 56 mL/min — ABNORMAL LOW (ref >60)
Glucose, Bld: 95 mg/dL (ref 70–99)
Potassium: 4.6 mmol/L (ref 3.5–5.1)
Sodium: 139 mmol/L (ref 135–145)
Total Bilirubin: 0.9 mg/dL (ref 0.3–1.2)
Total Protein: 6.9 g/dL (ref 6.5–8.1)

## 2021-08-02 MED ORDER — MONTELUKAST SODIUM 10 MG PO TABS
10.0000 mg | ORAL_TABLET | Freq: Once | ORAL | Status: AC
  Administered 2021-08-02: 10 mg via ORAL
  Filled 2021-08-02: qty 1

## 2021-08-02 MED ORDER — ACETAMINOPHEN 325 MG PO TABS
650.0000 mg | ORAL_TABLET | Freq: Once | ORAL | Status: AC
  Administered 2021-08-02: 650 mg via ORAL
  Filled 2021-08-02: qty 2

## 2021-08-02 MED ORDER — DENOSUMAB 120 MG/1.7ML ~~LOC~~ SOLN
120.0000 mg | Freq: Once | SUBCUTANEOUS | Status: AC
  Administered 2021-08-02: 120 mg via SUBCUTANEOUS
  Filled 2021-08-02: qty 1.7

## 2021-08-02 MED ORDER — DIPHENHYDRAMINE HCL 25 MG PO CAPS
50.0000 mg | ORAL_CAPSULE | Freq: Once | ORAL | Status: AC
  Administered 2021-08-02: 50 mg via ORAL
  Filled 2021-08-02: qty 2

## 2021-08-02 MED ORDER — DARATUMUMAB-HYALURONIDASE-FIHJ 1800-30000 MG-UT/15ML ~~LOC~~ SOLN
1800.0000 mg | Freq: Once | SUBCUTANEOUS | Status: AC
  Administered 2021-08-02: 1800 mg via SUBCUTANEOUS
  Filled 2021-08-02: qty 15

## 2021-08-02 MED ORDER — DEXAMETHASONE 4 MG PO TABS: 20.0000 mg | ORAL_TABLET | ORAL | 3 refills | Status: DC

## 2021-08-02 NOTE — Progress Notes (Signed)
Delton See and daratumumab given today. 30 minutes observation post dara per Dr. Tasia Catchings. Well tolerated, discharged in stable condition.

## 2021-08-02 NOTE — Patient Instructions (Signed)
[facility]  Discharge Instructions: Thank you for choosing [facility] to provide your oncology and hematology care.  If you have a lab appointment with the [facility], please go directly to the [facility] and check in at the registration area.  Wear comfortable clothing and clothing appropriate for easy access to any Portacath or PICC line.   We strive to give you quality time with your provider. You may need to reschedule your appointment if you arrive late (15 or more minutes).  Arriving late affects you and other patients whose appointments are after yours.  Also, if you miss three or more appointments without notifying the office, you may be dismissed from the clinic at the provider's discretion.      For prescription refill requests, have your pharmacy contact our office and allow 72 hours for refills to be completed.    Today you received the following chemotherapy and/or immunotherapy agents - daratumumab      To help prevent nausea and vomiting after your treatment, we encourage you to take your nausea medication as directed.  BELOW ARE SYMPTOMS THAT SHOULD BE REPORTED IMMEDIATELY: *FEVER GREATER THAN 100.4 F (38 C) OR HIGHER *CHILLS OR SWEATING *NAUSEA AND VOMITING THAT IS NOT CONTROLLED WITH YOUR NAUSEA MEDICATION *UNUSUAL SHORTNESS OF BREATH *UNUSUAL BRUISING OR BLEEDING *URINARY PROBLEMS (pain or burning when urinating, or frequent urination) *BOWEL PROBLEMS (unusual diarrhea, constipation, pain near the anus) TENDERNESS IN MOUTH AND THROAT WITH OR WITHOUT PRESENCE OF ULCERS (sore throat, sores in mouth, or a toothache) UNUSUAL RASH, SWELLING OR PAIN  UNUSUAL VAGINAL DISCHARGE OR ITCHING   Items with * indicate a potential emergency and should be followed up as soon as possible or go to the Emergency Department if any problems should occur.  Please show the CHEMOTHERAPY ALERT CARD or IMMUNOTHERAPY ALERT CARD at  check-in to the Emergency Department and triage nurse.  Should you have questions after your visit or need to cancel or reschedule your appointment, please contact [facility]  [phone] and follow the prompts.  Office hours are 8:00 a.m. to 4:30 p.m. Monday - Friday. Please note that voicemails left after 4:00 p.m. may not be returned until the following business day.  We are closed weekends and major holidays. You have access to a nurse at all times for urgent questions. Please call the main number to the clinic [phone] and follow the prompts.  For any non-urgent questions, you may also contact your provider using MyChart. We now offer e-Visits for anyone 15 and older to request care online for non-urgent symptoms. For details visit [email or web address].   Also download the MyChart app! Go to the app store, search "MyChart", open the app, select [facility], and log in with your MyChart username and password.  Due to Covid, a mask is required upon entering the hospital/clinic. If you do not have a mask, one will be given to you upon arrival. For doctor visits, patients may have 1 support person aged 34 or older with them. For treatment visits, patients cannot have anyone with them due to current Covid guidelines and our immunocompromised population.   Daratumumab; Hyaluronidase Injection What is this medication? DARATUMUMAB; HYALURONIDASE (dar a toom ue mab / hye al ur ON i dase) is a monoclonal antibody. Hyaluronidase is used to improve the effects of daratumumab. It treats certain types of cancer. Some of the cancers treated are multiple myeloma and light-chain amyloidosis. This medicine may  be used for other purposes; ask your health care provider or pharmacist if you have questions. COMMON BRAND NAME(S): DARZALEX FASPRO What should I tell my care team before I take this medication? They need to know if you have any of these conditions: heart  disease infection especially a viral infection such as chickenpox, cold sores, herpes, or hepatitis B lung or breathing disease an unusual or allergic reaction to daratumumab, hyaluronidase, other medicines, foods, dyes, or preservatives pregnant or trying to get pregnant breast-feeding How should I use this medication? This medicine is for injection under the skin. It is given by a health care professional in a hospital or clinic setting. Talk to your pediatrician regarding the use of this medicine in children. Special care may be needed. Overdosage: If you think you have taken too much of this medicine contact a poison control center or emergency room at once. NOTE: This medicine is only for you. Do not share this medicine with others. What if I miss a dose? Keep appointments for follow-up doses as directed. It is important not to miss your dose. Call your doctor or health care professional if you are unable to keep an appointment. What may interact with this medication? Interactions have not been studied. This list may not describe all possible interactions. Give your health care provider a list of all the medicines, herbs, non-prescription drugs, or dietary supplements you use. Also tell them if you smoke, drink alcohol, or use illegal drugs. Some items may interact with your medicine. What should I watch for while using this medication? Your condition will be monitored carefully while you are receiving this medicine. This medicine can cause serious allergic reactions. To reduce your risk, your health care provider may give you other medicine to take before receiving this one. Be sure to follow the directions from your health care provider. This medicine can affect the results of blood tests to match your blood type. These changes can last for up to 6 months after the final dose. Your healthcare provider will do blood tests to match your blood type before you start treatment. Tell all of your  healthcare providers that you are being treated with this medicine before receiving a blood transfusion. This medicine can affect the results of some tests used to determine treatment response; extra tests may be needed to evaluate response. Do not become pregnant while taking this medicine or for 3 months after stopping it. Women should inform their health care provider if they wish to become pregnant or think they might be pregnant. There is a potential for serious side effects to an unborn child. Talk to your health care provider for more information. Do not breast-feed an infant while taking this medicine. What side effects may I notice from receiving this medication? Side effects that you should report to your doctor or health care professional as soon as possible: allergic reactions like skin rash, itching or hives, swelling of the face, lips, or tongue blurred vision breathing problems chest pain cough dizziness fast heartbeat feeling faint or lightheaded headache low blood counts - this medicine may decrease the number of white blood cells, red blood cells and platelets. You may be at increased risk for infections and bleeding. nausea, vomiting shortness of breath signs of decreased platelets or bleeding - bruising, pinpoint red spots on the skin, black, tarry stools, blood in the urine signs of decreased red blood cells - unusually weak or tired, feeling faint or lightheaded, falls signs of infection - fever  or chills, cough, sore throat, pain or difficulty passing urine Side effects that usually do not require medical attention (report these to your doctor or health care professional if they continue or are bothersome): back pain constipation diarrhea pain, tingling, numbness in the hands or feet muscle cramps swelling of the ankles, feet, hands tiredness trouble sleeping This list may not describe all possible side effects. Call your doctor for medical advice about side  effects. You may report side effects to FDA at 1-800-FDA-1088. Where should I keep my medication? This drug is given in a hospital or clinic and will not be stored at home. NOTE: This sheet is a summary. It may not cover all possible information. If you have questions about this medicine, talk to your doctor, pharmacist, or health care provider.  2022 [employer] Standard (2020-11-09 12:51:54)

## 2021-08-02 NOTE — Telephone Encounter (Signed)
I checked on this.  I also checked with Dr. Tasia Catchings.  At this point, it would make sense to continue aspirin and pravastatin but he should be able to stay off Plavix/clopidogrel.  Thanks.

## 2021-08-02 NOTE — Progress Notes (Signed)
Pt in for follow up, reports severe pain on top of right foot. Pt also states has not been taking Plavix for 4-6 months.

## 2021-08-02 NOTE — Progress Notes (Signed)
Hematology/Oncology  Follow up note [facility] [phone] [phone]) [phone]   Patient Care Team: Tonia Ghent, MD as PCP - General (Family Medicine) Earlie Server, MD as Consulting Physician (Oncology) Beather Arbour Lilyan Punt, MD as Referring Physician (Hematology and Oncology) Diannia Ruder, MD as Referring Physician (Hematology and Oncology)  REASON FOR VISIT Follow up for multiple myeloma.   HISTORY OF PRESENTING ILLNESS:  Luis Burke is a  65 y.o.  male with PMH listed below presents for follow up of multiple myeloma.  # 07/27/2018 multiple myeloma panel showed M protein of 0.1, IgG 662, IgA 49, IgM 7. Patient was called back to further labs done. 08/10/2018, free light chain ratio showed extremely high level of kappa free light chain 10,183, with a kappa lambda light chain ratio of 1414.31 LDH 164 Beta-2 microglobulin 5 Patient was called and discuss about results.  He was recommended to undergo bone marrow biopsy and PET scan. 08/19/2018 bone marrow biopsy showed hypercellular marrow 80%, involved by plasma cell neoplasm up to 95%.  Consistent with plasma cell myeloma. Myeloma FISH  gain of gain of CEP12  09/10/2018 skeletal survey showed questionable small lucent lesions within the midshaft of the humerus bilaterally.  Otherwise no suspicious focal lytic lesion or acute bone abnormality. 08/25/2018 PET scan showed no definite hypermetabolic bone disease but CT findings are highly suspicious for numerous small myelomatous lesions involving the spine, sternum and scattered ribs.  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 He received preparative regimen with melphalan 200 mg/m on 04/22/2019 followed by autologous stem cell infusion on 04/23/2019. Currently he is transfusion independent.  Transfusion criteria with as needed hemoglobin less than 7.5 for platelet less than 10,000.  Transplant course was complicated with  febrile neutropenia grade 3, treated with vancomycin and cephapirin until engraftment on 05/06/2019. Patient also had engraftment syndrome and had to be started on Solu-Medrol 25 mg twice daily on 05/05/2019.  Transition to Medrol Dosepak on 05/07/2019 to complete steroid taper as outpatient.  Patient is currently on acyclovir for 1 year after transplant, dose was reduced on 722 11/20/2018 due to renal function. Patient had a baseline CKD with creatinine running between 1.4-1.6.  He had acute on chronic kidney injury and creatinine went up to 2.2 on 722.  Fluid hydration was given and creatinine decreased to baseline 1.5-1.7. His Hickman catheter was removed.  Candida Cruris treated with topical clotrimazole 1% 3 times daily and to resolution..  #  seen by Dr. Tonia Brooms BMT team on 06/30/2019.revaccination at [facility] begins at 12 months post transplant I discussed with [facility] hematology Dr.Choi and he recommends plans as listed below.  -patient to be started on Ixazomib maintenance: -Ixazomib 77m on D1/D8/D15 out of 28 day cycle.  -patient will start re-vaccination 12 months post transplant.  -Post transplant bone marrow biopsy if clinically indicated.  # seen by [facility] bone marrow transplant team in June 2021.  Patient has been started on immunization protocol.  He reports no new complaints.  #[facility]12[facility]  Lucent lesion in the L4 spinous process measuring 4 mm which is  nonspecific. Consider confirmation of marrow replacing process with MRI.  2.  Incompletely characterized renal cysts   # He continues to have chronic back pain and neck pain.  [facility] MRI lumbar spine with and without contrast showed no evidence of spinal metastasis.  Lumbar spondylosis is most pronounced  at L5-S1 where there is severe right and moderate left neural foraminal stenosis. 08/11/2020, x-ray cervical spine complete with flexion and extension showed The cervical spine  is visualized from C1 to the top of T1 on the lateral  view.  Reversal of the normal cervical lordosis with a focal kyphosis centered at the C5-C6 intervertebral level. Trace anterolisthesis of C4 onto C5. Trace retrolisthesis of C6 on C7. No evidence of dynamic instability is noted on  the flexion or extension views.   No prevertebral soft tissue swelling. Cervical vertebral body heights are maintained. Multilevel degenerative changes of the cervical spine including discogenic disease, most pronounced at C5-C6 and C6-C7, and facet arthropathy most pronounced at C4-C5. Multilevel mild to moderate left neural foraminal osseous narrowing of the mid to lower cervical vertebrae, possibly centrally/artifactual by patient positioning.  #07/28/2020 bone marrow normocellular marrow with polytypic plasmacytosis, 3 to 8%   Patient is in remission.  Cytogenetics negative for trisomy 12.  Negative myeloma FISH panel.   # Lumbar lesion,  11/13/20 [facility]  MRI cervical sine w/wo contrast - no evidence of metastatic disease C3-4 severe spinal stenosis, left neural foraminal narrowing. C4-5 spinal canal stenosis. [facility] MRI results showed no metastatic spine lesion. Chronic back pain and neck pain, images are consistent with chronic degenerative disease.  Patient continues to follow-up with [place] orthopedic surgeon.  04/03/2021, light chain ratio increased to 3.76.   05/15/2021 bone marrow biopsy showed slightly hypercellular bone marrow for age with slight plasmacytosis.  5% plasma cells with kappa light chain restriction.  Cytogenetics normal.  Myeloma FISH negative # 05/15/2021 bone marrow biopsy showed slightly hypercellular bone marrow for age with slight plasmacytosis.  5% plasma cells with kappa light chain restriction.  Cytogenetics normal.  Myeloma FISH negative  # 05/31/2021 Daratumumab -Pomalyst-dexamethasone-D1 patient had grade 2 infusion reaction to daratumumab and received steroids, Benadryl, famotidine.  He was  able to finish treatment.D8 was able to finish Daratumumab with no infusion reactions.  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   INTERVAL HISTORY Luis Burke is a 65 y.o. male who has above history reviewed by me today for follow-up  Currently on Daratumumab Pomalyst dexamethasone  patient reports feeling well today.  He had right foot pain with extension and dorsiflexion. No nausea vomiting diarrhea.    Review of Systems  Constitutional:  Negative for appetite change, chills, fatigue, fever and unexpected weight change.  HENT:   Negative for hearing loss and voice change.   Eyes:  Negative for eye problems and icterus.  Respiratory:  Negative for chest tightness, cough and shortness of breath.   Cardiovascular:  Negative for chest pain and leg swelling.  Gastrointestinal:  Negative for abdominal distention and abdominal pain.  Endocrine: Negative for hot flashes.  Genitourinary:  Negative for difficulty urinating, dysuria and frequency.   Musculoskeletal:  Positive for back pain and neck pain. Negative for arthralgias.  Skin:  Negative for itching and rash.  Neurological:  Negative for light-headedness and numbness.  Hematological:  Negative for adenopathy. Does not bruise/bleed easily.  Psychiatric/Behavioral:  Negative for confusion.    MEDICAL HISTORY:  Past Medical History:  Diagnosis Date   AKI (acute kidney injury) ([place]) 01/06/2019   Anemia    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[place])    autologous stem cell bone marrow transplant on 04/23/2019.   Fatty liver    on u/s 07/2018   Hyperlipidemia 03/2004   Hypertension 1994   Multiple myeloma ([place])    Snores  SURGICAL HISTORY: Past Surgical History:  Procedure Laterality Date   CATARACT EXTRACTION W/PHACO Right 03/04/2018   Procedure: CATARACT EXTRACTION PHACO AND INTRAOCULAR LENS PLACEMENT (IOC)  RIGHT TORIC;  Surgeon: Brasington, Chadwick, MD;  Location: [facility];  Service: Ophthalmology;  Laterality:  Right;  [place] no Toric Lens 1:45 5.3   COLONOSCOPY WITH PROPOFOL N/A 07/20/2018   Procedure: COLONOSCOPY WITH PROPOFOL;  Surgeon: Anna, Kiran, MD;  Location: [facility];  Service: Gastroenterology;  Laterality: N/A;   ESOPHAGOGASTRODUODENOSCOPY (EGD) WITH PROPOFOL N/A 07/20/2018   Procedure: ESOPHAGOGASTRODUODENOSCOPY (EGD) WITH PROPOFOL;  Surgeon: Anna, Kiran, MD;  Location: [facility]COPY;  Service: Gastroenterology;  Laterality: N/A;   EYE SURGERY Right    HERNIA REPAIR     L Lap herniorraphy  04/2000   Left wrist ganglionectomy      SOCIAL HISTORY: Social History   Socioeconomic History   Marital status: Single    Spouse name: Not on file   Number of children: 1   Years of education: Not on file   Highest education level: Not on file  Occupational History   Occupation: [employer]  Tobacco Use   Smoking status: Never   Smokeless tobacco: Never   Tobacco comments:    Occassionally  Vaping Use   Vaping Use: Never used  Substance and Sexual Activity   Alcohol use: Not Currently    Alcohol/week: 3.0 standard drinks    Types: 3 Cans of beer per week   Drug use: No   Sexual activity: Not on file  Other Topics Concern   Not on file  Social History Narrative   Divorced 2009, married 05/27/21.     His daughter died 4 days after giving birth to the patient's granddaughter   Has joint custody of his deceased daughter's child   Worked at [employer] is [place], retired 2020.     Social Determinants of Health   Financial Resource Strain: Not on file  Food Insecurity: Not on file  Transportation Needs: Not on file  Physical Activity: Not on file  Stress: Not on file  Social Connections: Not on file  Intimate Partner Violence: Not on file    FAMILY HISTORY: Family History  Problem Relation Age of Onset   Hypertension Mother    Diabetes Mother    Heart disease Mother        CAD   Dementia Mother    Prostate cancer Father    Hypertension Father    Heart  disease Father        MI 02/03   Colon cancer Father    Hypertension Sister    Hypertension Sister    Hypertension Sister     ALLERGIES:  is allergic to bactrim [sulfamethoxazole-trimethoprim], clonidine derivatives, and tadalafil.  MEDICATIONS:  Current Outpatient Medications  Medication Sig Dispense Refill   acetaminophen (TYLENOL) 325 MG tablet Take 2 tablets (650 mg total) by mouth See admin instructions. 1 hour prior to chemotherapy treatments 30 tablet 0   amLODipine (NORVASC) 5 MG tablet TAKE 2 TABLETS BY MOUTH EVERY DAY 180 tablet 1   ASPIRIN 81 PO Take 81 mg by mouth daily.     B Complex Vitamins (VITAMIN B COMPLEX PO) SMARTSIG:By Mouth     chlorhexidine (PERIDEX) 0.12 % solution Use as directed 15 mLs in the mouth or throat 2 (two) times daily. 473 mL 1   co-enzyme Q-10 50 MG capsule Take 50 mg by mouth daily.     [employer] VITAMIN B12 1000 MCG tablet   TAKE 1 TABLET BY MOUTH EVERY DAY 90 tablet 3   dexamethasone (DECADRON) 4 MG tablet Take 5 tablets (20 mg total) by mouth once a week. Take at least 1 hour prior to infusion appt. Take with food. To start on 06/07/21. 20 tablet 1   diphenhydrAMINE (BENADRYL) 50 MG tablet Take 1 tablet (50 mg total) by mouth See admin instructions. Take 1 tablet 1 hour prior to chemotherapy treatments. 30 tablet 0   hydrALAZINE (APRESOLINE) 10 MG tablet TAKE 1 TABLET (10 MG TOTAL) BY MOUTH 3 (THREE) TIMES DAILY. 270 tablet 1   lisinopril (ZESTRIL) 20 MG tablet Take 1 tablet (20 mg total) by mouth daily. Please keep appt on 07/05/21 90 tablet 0   LORazepam (ATIVAN) 1 MG tablet TAKE 1 TABLET (1 MG TOTAL) BY MOUTH EVERY 6 (SIX) HOURS AS NEEDED FOR ANXIETY (FOR NAUSEA)     magnesium chloride (SLOW-MAG) 64 MG TBEC SR tablet Take 1 tablet (64 mg total) by mouth daily. 60 tablet 0   metoprolol succinate (TOPROL-XL) 50 MG 24 hr tablet TAKE 4 TABLETS (200 MG TOTAL) BY MOUTH DAILY. TAKE WITH OR IMMEDIATELY FOLLOWING A MEAL. 360 tablet 2   montelukast (SINGULAIR) 10 MG  tablet Take 1 tablet (10 mg total) by mouth See admin instructions. Take 1day prior to chemotherapy 30 tablet 1   pomalidomide (POMALYST) 2 MG capsule Take 1 capsule (2 mg total) by mouth daily. Take for 21 days on, then hold for 7 days. Repeat every 28 days. 21 capsule 0   pravastatin (PRAVACHOL) 10 MG tablet Take 1 tablet (10 mg total) by mouth daily. 90 tablet 3   sildenafil (REVATIO) 20 MG tablet Take 1-5 tablets (20-100 mg total) by mouth daily as needed. 50 tablet 12   zinc gluconate 50 MG tablet Take 50 mg by mouth daily.     clopidogrel (PLAVIX) 75 MG tablet Take by mouth. (Patient not taking: Reported on 08/02/2021)     cyclobenzaprine (FLEXERIL) 5 MG tablet Take 1 tablet (5 mg total) by mouth daily as needed for muscle spasms. (Patient not taking: Reported on 08/02/2021) 30 tablet 0   loperamide (IMODIUM) 2 MG capsule Take 1 capsule (2 mg total) by mouth See admin instructions. Take 2 capsules at the onset of diarrhea, then 1 capsule every 2 hours or after every loose bowel movement. Maximum 8 capsules per 24 hours. (Patient not taking: Reported on 08/02/2021) 60 capsule 0   ondansetron (ZOFRAN) 8 MG tablet Take 1 tablet (8 mg total) by mouth every 8 (eight) hours as needed. for nausea (Patient not taking: Reported on 08/02/2021) 30 tablet 1   prochlorperazine (COMPAZINE) 10 MG tablet TAKE 1 TABLET (10 MG TOTAL) BY MOUTH EVERY 6 (SIX) HOURS AS NEEDED FOR NAUSEA (Patient not taking: Reported on 08/02/2021)     No current facility-administered medications for this visit.     PHYSICAL EXAMINATION: ECOG PERFORMANCE STATUS: 1 - Symptomatic but completely ambulatory Vitals:   08/02/21 0901  BP: 134/85  Pulse: 61  Resp: 18  Temp: 98.8 F (37.1 C)  SpO2: 96%   Filed Weights   08/02/21 0901  Weight: 208 lb (94.3 kg)    Physical Exam Constitutional:      General: He is not in acute distress. HENT:     Head: Normocephalic and atraumatic.  Eyes:     General: No scleral  icterus. Cardiovascular:     Rate and Rhythm: Normal rate and regular rhythm.     Heart sounds: Normal heart  sounds.  Pulmonary:     Effort: Pulmonary effort is normal. No respiratory distress.     Breath sounds: No wheezing.  Abdominal:     General: Bowel sounds are normal. There is no distension.     Palpations: Abdomen is soft.  Musculoskeletal:        General: No deformity. Normal range of motion.     Cervical back: Normal range of motion and neck supple.  Skin:    General: Skin is warm and dry.     Findings: No erythema or rash.  Neurological:     Mental Status: He is alert and oriented to person, place, and time. Mental status is at baseline.     Cranial Nerves: No cranial nerve deficit.     Coordination: Coordination normal.  Psychiatric:        Mood and Affect: Mood normal.     LABORATORY DATA:  I have reviewed the data as listed Lab Results  Component Value Date   WBC 4.5 08/02/2021   HGB 12.5 (L) 08/02/2021   HCT 37.6 (L) 08/02/2021   MCV 103.0 (H) 08/02/2021   PLT 122 (L) 08/02/2021   Recent Labs    07/19/21 0800 07/26/21 0758 08/02/21 0824  NA 136 137 139  K 4.2 4.1 4.6  CL 105 105 101  CO2 $Re'24 25 30  'KgI$ GLUCOSE 106* 103* 95  BUN 25* 18 20  CREATININE 1.22 1.29* 1.39*  CALCIUM 8.6* 8.5* 10.0  GFRNONAA >60 >60 56*  PROT 6.9 6.4* 6.9  ALBUMIN 3.9 3.9 4.2  AST 14* 16 18  ALT $Re'23 19 19  'Dxf$ ALKPHOS 69 62 66  BILITOT 0.6 0.3 0.9    Iron/TIBC/Ferritin/ %Sat    Component Value Date/Time   IRON 74 07/01/2018 0944   TIBC 250 07/01/2018 0944   FERRITIN 357 07/01/2018 0944   IRONPCTSAT 30 07/01/2018 0944    Lab Results  Component Value Date   TOTALPROTELP 6.7 06/26/2021   ALBUMINELP 4.0 03/08/2021   A1GS 0.2 03/08/2021   A2GS 0.7 03/08/2021   BETS 1.1 03/08/2021   GAMS 1.1 03/08/2021   MSPIKE Not Observed 03/08/2021   SPEI Comment 03/08/2021   Lab Results  Component Value Date   KPAFRELGTCHN 28.5 (H) 06/26/2021   LAMBDASER 10.0 06/26/2021    KAPLAMBRATIO 2.85 (H) 06/26/2021    RADIOGRAPHIC STUDIES: I have personally reviewed the radiological images as listed and agreed with the findings in the report. DG Cervical Spine Complete  Result Date: 07/21/2021 CLINICAL DATA:  Multiple myeloma Neck pain for few weeks EXAM: CERVICAL SPINE - COMPLETE 4+ VIEW COMPARISON:  08/10/2019 FINDINGS: There is reversal of normal cervical lordosis with apex at C5. There is minimal retrolisthesis C5 on C6. No significant vertebral body height loss. Moderate disc height loss and endplate degenerative changes seen at C5-C6. Mild disc height loss and endplate spurring seen at C6-C7. Severe right neural foraminal stenosis at C3-C4. Mild right neural foraminal stenosis at C5-C6 and C6-C7. Moderate left neural foraminal stenosis at C4-C5 and C5-C6. Mild left neural foraminal stenosis at C3-C4 and C6-C7. is Facet degenerative changes seen throughout the cervical spine. Minimal atheromatous plaque noted in the region the carotid bulbs. IMPRESSION: Multilevel degenerative changes of the cervical spine as above. Electronically Signed   By: Miachel Roux M.D.   On: 07/21/2021 09:13   MR Lumbar Spine W Wo Contrast  Result Date: 06/19/2021 CLINICAL DATA:  Back pain, multiple myeloma EXAM: MRI LUMBAR SPINE WITHOUT AND WITH CONTRAST TECHNIQUE: Multiplanar and  multiecho pulse sequences of the lumbar spine were obtained without and with intravenous contrast. CONTRAST:  44mL GADAVIST GADOBUTROL 1 MMOL/ML IV SOLN COMPARISON:  None. FINDINGS: Segmentation:  Standard. Alignment:  No significant listhesis. Vertebrae: Vertebral body heights are maintained. No substantial marrow edema. No suspicious osseous lesion. Conus medullaris and cauda equina: Conus extends to the T12-L1 level. Conus and cauda equina appear normal. No abnormal intrathecal enhancement. Paraspinal and other soft tissues: Probable bilateral renal cysts partially imaged. Disc levels: L1-L2:  No canal or foraminal stenosis.  L2-L3:  No canal or foraminal stenosis. L3-L4:  Disc bulge.  No canal or foraminal stenosis. L4-L5: Disc bulge. Central and left subarticular annular fissure. No canal or foraminal stenosis. L5-S1: Disc bulge with endplate osteophytic ridging. Facet arthropathy. No canal stenosis. Moderate right and mild left foraminal stenosis. Potential exiting right L5 nerve root compression. IMPRESSION: No evidence of myelomatous involvement. No compression fracture. Mild lower lumbar degenerative changes. There is right foraminal narrowing at L5-S1 with potential exiting nerve root compression. Electronically Signed   By: Macy Mis M.D.   On: 06/19/2021 12:20   CT BONE MARROW BIOPSY & ASPIRATION  Result Date: 05/15/2021 CLINICAL DATA:  History of multiple myeloma with possible relapse due to increased light chains. Need for bone marrow biopsy for further evaluation. EXAM: CT GUIDED BONE MARROW ASPIRATION AND BIOPSY ANESTHESIA/SEDATION: Versed 2.0 mg IV, Fentanyl 100 mcg IV Total Moderate Sedation Time:   13 minutes. The patient's level of consciousness and physiologic status were continuously monitored during the procedure by Radiology nursing. PROCEDURE: The procedure risks, benefits, and alternatives were explained to the patient. Questions regarding the procedure were encouraged and answered. The patient understands and consents to the procedure. A time out was performed prior to initiating the procedure. The right gluteal region was prepped with chlorhexidine. Sterile gown and sterile gloves were used for the procedure. Local anesthesia was provided with 1% Lidocaine. Under CT guidance, an 11 gauge On Control bone cutting needle was advanced from a posterior approach into the right iliac bone. Needle positioning was confirmed with CT. Initial non heparinized and heparinized aspirate samples were obtained of bone marrow. Core biopsy was performed via the On Control drill needle. Two separate core biopsy samples were  obtained. COMPLICATIONS: None FINDINGS: Inspection of initial aspirate did reveal visible particles. Intact core biopsy samples were obtained. IMPRESSION: CT guided bone marrow biopsy of right posterior iliac bone with both aspirate and core samples obtained. Electronically Signed   By: Aletta Edouard M.D.   On: 05/15/2021 10:51      ASSESSMENT & PLAN:  1. Multiple myeloma in relapse ([place])   2. Encounter for antineoplastic chemotherapy   3. Thrombocytopenia ([place])   4. Chemotherapy induced neutropenia ([facility])   5. Neck pain   6. Right foot pain    #Light chain multiple myeloma beta 2 microglobulin 5 and normal albumin.  Stage II.cytogenetics showed normal male chromosome, MDS FISH panel showed trisomy 84- Standard Risk.S/p RVD x 9 and  Autologous bone marrow stem cell transplant at [facility] on 04/23/2019.' Early relapse multiple myeloma. Labs reviewed and discussed with patient To proceed with cycle 4 day 1 daratumumab/dexamethasone 20 mg/Pomalyst 3 weeks on 1 week off. Recommend patient to take dexamethasone 20 mg weekly.  # Chemotherapy induced neutropenia, ANC 2.2 #Chronic thrombocytopenia, platelet count is 122,000.  Stable  # CKD, avoid nephrotoxin.  Creatinine fluctuates.  Encourage patient to increase oral hydration.   #Neck pain, previous Jan 2022 MRI cervical spine showed  DJD.  Check X ray of cervical spine- DJD  Refer to physical therapy.  Flexeril $RemoveB'5mg'[id number]$  daily PRN  Post bone marrow transplant care He has completed 1 year of acyclovir.  Off acyclovir now. [facility] vaccination [facility].   #Right foot pain, possible extensor tendinitis.  Recommend patient to apply cold and warm compress.  Dexamethasone may help his symptoms.  He plans to get an appointment with podiatry.  #Bone health/osteopenia/multiple myeloma-  Continue Xgeva monthly.-Xgeva today. Continue calcium and vitamin D supplementation. Follow up 2 weeks for Lab MD Daratumumab treatment.   Earlie Server, MD,  PhD Hematology Oncology [place] at [facility] 08/02/2021

## 2021-08-03 NOTE — Telephone Encounter (Signed)
Patient notified to stay off plavix and he also discussed this with Dr. Tasia Catchings already. Advised to continue aspirin and pravastatin.

## 2021-08-06 ENCOUNTER — Encounter: Payer: Self-pay | Admitting: Oncology

## 2021-08-06 ENCOUNTER — Ambulatory Visit: Payer: Medicare Other | Admitting: Physical Therapy

## 2021-08-06 ENCOUNTER — Encounter: Payer: Self-pay | Admitting: Physical Therapy

## 2021-08-06 DIAGNOSIS — M542 Cervicalgia: Secondary | ICD-10-CM

## 2021-08-06 NOTE — Therapy (Signed)
[facility] [address] [place], [address] Phone: [phone]   Fax:  [phone]  Physical Therapy Treatment  Patient Details  Name: Luis Burke MRN: [id number] Date of Birth: 09/18/1956 No data recorded  Encounter Date: 08/06/2021   PT End of Session - 08/06/21 0729     Visit Number 3    Number of Visits 17    Date for PT Re-Evaluation 09/24/21    Authorization - Visit Number 3    Progress Note Due on Visit 10    PT Start Time 0732    PT Stop Time 0812    PT Time Calculation (min) 40 min    Activity Tolerance Patient tolerated treatment well    Behavior During Therapy [facility] for tasks assessed/performed             Past Medical History:  Diagnosis Date   AKI (acute kidney injury) ([place]) 01/06/2019   Anemia    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   Fatty liver    on u/s 07/2018   Hyperlipidemia 03/2004   Hypertension 1994   Multiple myeloma ([place])    Snores     Past Surgical History:  Procedure Laterality Date   CATARACT EXTRACTION W/PHACO Right 03/04/2018   Procedure: CATARACT EXTRACTION PHACO AND INTRAOCULAR LENS PLACEMENT ([place])  RIGHT TORIC;  Surgeon: Leandrew Koyanagi, MD;  Location: [facility];  Service: Ophthalmology;  Laterality: Right;  [place] no Toric Lens 1:45 5.3   COLONOSCOPY WITH PROPOFOL N/A 07/20/2018   Procedure: COLONOSCOPY WITH PROPOFOL;  Surgeon: Jonathon Bellows, MD;  Location: [facility] ENDOSCOPY;  Service: Gastroenterology;  Laterality: N/A;   ESOPHAGOGASTRODUODENOSCOPY (EGD) WITH PROPOFOL N/A 07/20/2018   Procedure: ESOPHAGOGASTRODUODENOSCOPY (EGD) WITH PROPOFOL;  Surgeon: Jonathon Bellows, MD;  Location: [facility] ENDOSCOPY;  Service: Gastroenterology;  Laterality: N/A;   EYE SURGERY Right    HERNIA REPAIR     L Lap herniorraphy  04/2000   Left wrist ganglionectomy      There were no vitals filed for this  visit.   Subjective Assessment - 08/06/21 0733     Subjective Pt report L sided neck at 2-3/10 on NPRS. He reports active participation in HEP but still having some difficulty with SNAGS exercises. He reports continuing maintenance treatment for cancer via oral pills at his time.    Pertinent History Luis Burke is a 65 year old male with primary c/o cervical pain with L side > R secondary to numbness in the R foot. He reports his neck pain began in 2019 without MOI but has experienced radiating pain down L UE. He reports difficulty with sitting, lifting, and performing household chores. He reports having a history of repetitive lifitng and now reports having a lifttng restriction of 10 pounds. He states his pain is alleviated with rest, change of position, and the use of salonpas patch. He reports having difficulty sleeping due to pain but it s usually able to reposition and return to sleep. He reports his pain at best is 0/10 and 5-6/10 at worst. Pt denies any unexplained weight fluctuation, saddle paresthesia, loss of bowel/bladder function, or unrelenting night pain at this time. Pt is currently undergoing chemotherapy treatment for multiple myeloma ever Thursday and reports taking a steroid Dexamethosone weekly. Pt has a past medical history of bone marrow transplant is being followed by [facility] oncology. Pt other PMH HTN, hyperlipidemia, lumbar stenosis, and anemia.  Limitations Lifting;Sitting;House hold activities    How long can you sit comfortably? 45 min    How long can you stand comfortably? hours    How long can you walk comfortably? 15-62mn    Diagnostic tests xray-DJD    Patient Stated Goals Reduce radiating symptoms and reduce neck pain            Therex    Nu Step L1 x 5 min UE only seat 8 SPM > 75   Cervical Snag L/R x 15 reps each directions with active demonstration, VC and Tcues to perform correctly with success   Chin Tucks x 15 reps with cues to tilt chin downwards  and repeated retraction for with success  Seated self cervical distraction 3x 30 sec    High Theraband row 3 x 10 reps GTB with cues to perform scapular retractions  Thoracic Rotation on wall 1 x 8 reps each direction    Upper Trap stretch 3 x 30 sec each side     Manual Therapy   Thoracic Segmental rotation T12-T5 3 bouts x 30 sec Each side   Sub Occipital release 1 x 60 sec    Cervical Distraction 2 bouts x 30 sec  *manual therapy performed in supine for increased tissue extensibility and pain modulation.   (Prone) CPA/UPA R&L C3-C7 3 bouts x 30 grade 3 mobilizations with decreased sensitivity and increase in mobility                                      PT Education - 08/06/21 0729     Education Details therex form/technique    Person(s) Educated Patient    Methods Explanation;Demonstration    Comprehension Verbalized understanding;Returned demonstration              PT Short Term Goals - 07/30/21 1115       PT SHORT TERM GOAL #1   Title Pt will demonstrate independence with HEP to improve cervical function for increased ability to participate with ADLs    Baseline HEP given    Time 4    Period Weeks    Status New    Target Date 08/27/21               PT Long Term Goals - 07/30/21 1241       PT LONG TERM GOAL #1   Title Patient will increase FOTO score to ** to demonstrate predicted increase in functional mobility to complete ADLs    Baseline 07/30/21:    Time 8    Period Weeks    Status New    Target Date 09/24/21      PT LONG TERM GOAL #2   Title Pt will decrease worst cervical pain as reported on NPRS by at least 2 points in order to demonstrate clinically significant reduction in cercvical pain.    Baseline 07/30/21: 6/10    Time 8    Period Weeks    Status New    Target Date 09/24/21      PT LONG TERM GOAL #3   Title Pt will improve active cervical rotation to 60 degrees and lateral flexion to 45 in order  to drive.    Baseline Rotation 45 deg; lateral flexion approximately 20 deg    Time 8    Status New    Target Date 09/24/21  Plan - 08/06/21 0739     Clinical Impression Statement Pt tolerated session well with decrease in cervical pain following manual therapy and exercise. PT continued cervical/thoracic mobility this session with success. Pt demonstrates increase in cervical ROM, decrease in pain, and increase in confidence with HEP. Pt will continue to benefit from skilled PT to address impairments and achieve set goals.    Personal Factors and Comorbidities Age;Time since onset of injury/illness/exacerbation;Comorbidity 2    Comorbidities Multiple myeloma, chemotherapy, obesity    Examination-Activity Limitations Sit;Sleep;Lift    Examination-Participation Restrictions Driving;Cleaning;Community Activity    Stability/Clinical Decision Making Stable/Uncomplicated    Clinical Decision Making Low    PT Frequency 2x / week    PT Treatment/Interventions ADLs/Self Care Home Management;Cryotherapy;Traction;Therapeutic exercise;Therapeutic activities;Patient/family education;Manual techniques;Passive range of motion;Dry needling;Spinal Manipulations;Joint Manipulations;Neuromuscular re-education;Electrical Stimulation    PT Next Visit Plan manual therapy and cervical mobility    PT Home Exercise Plan chin tucks, Snags, Upper traps stretch    Consulted and Agree with Plan of Care Patient             Patient will benefit from skilled therapeutic intervention in order to improve the following deficits and impairments:  Hypomobility, Pain, Postural dysfunction, Decreased range of motion, Obesity  Visit Diagnosis: Neck pain     Problem List Patient Active Problem List   Diagnosis Date Noted   Medicare welcome exam 07/26/2021   Chemotherapy induced neutropenia ([place]) 07/19/2021   Bone lesion 06/05/2021   Chronic midline low back pain without sciatica  06/05/2021   Neck pain, chronic 08/11/2020   Lumbar degenerative disc disease 08/10/2020   Lumbar foraminal stenosis 08/09/2020   Thrombocytopenia ([place]) 07/20/2020   Encounter for antineoplastic chemotherapy 07/20/2020   Stage 3a chronic kidney disease ([place]) 07/20/2020   Productive cough 03/08/2020   Carotid arterial disease ([place]) 11/26/2019   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[place])    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[place]) 04/23/2019   Leg pain 03/03/2019   AKI (acute kidney injury) ([place]) 01/06/2019   Multiple myeloma ([place]) 08/26/2018   Goals of care, counseling/discussion 08/26/2018   Fatty liver 07/29/2018   Anemia 07/06/2018   Creatinine elevation 07/06/2018   Headache 07/02/2018   Snoring 06/10/2018   FH: colon cancer 06/10/2018   Advance care planning 11/24/2015   Routine general medical examination at a health care facility 04/29/2012   ESSENTIAL HYPERTENSION, BENIGN 01/20/2008   HLD (hyperlipidemia) 06/02/2007   ERECTILE DYSFUNCTION, 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 [place], [address] Phone: [phone]   Fax:  [phone]  Name: HRISTOPHER MISSILDINE MRN: [id number] Date of Birth: [birth year]

## 2021-08-06 NOTE — Progress Notes (Signed)
08/07/21 2:06 PM   [name] November 20, 1955 [id number]  Referring provider:  Tonia Ghent, MD [address] [place],  [place] [address] Chief Complaint  Patient presents with   Elevated PSA     HPI: Luis Burke is a 65 y.o.male who presents today for further evaluation of elevating PSA.   He was referred by his PCP, Dr. Elsie Stain, his most recent PSA on 07/26/2021 was 11.93. There are no previous PSA's for comparison.   He also has a history of erectile dysfunction and is currently on sildenafil.  He has multiple myeloma and is followed in oncology by Dr. Earlie Server.   He is doing well today. He reports that he urinates at least two times a night with a slow stream.  He is on aspirin, and in the past was on Plavix.   He reports that his father had prostate cancer.  He died in his 88s and is unclear whether or not he ever had treatment for this.  He did not die of prostate cancer.    IPSS     Row Name 08/07/21 1300         International Prostate Symptom Score   How often have you had the sensation of not emptying your bladder? Not at All     How often have you had to urinate less than every two hours? Less than 1 in 5 times     How often have you found you stopped and started again several times when you urinated? Less than half the time     How often have you found it difficult to postpone urination? Less than 1 in 5 times     How often have you had a weak urinary stream? About half the time     How often have you had to strain to start urination? Less than half the time     How many times did you typically get up at night to urinate? 2 Times     Total IPSS Score 11       Quality of Life due to urinary symptoms   If you were to spend the rest of your life with your urinary condition just the way it is now how would you feel about that? 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   Fatty liver    on u/s 07/2018   Hyperlipidemia 03/2004   Hypertension 1994   Multiple myeloma ([place])    Snores     Surgical History: Past Surgical History:  Procedure Laterality Date   CATARACT EXTRACTION W/PHACO Right 03/04/2018   Procedure: CATARACT EXTRACTION PHACO AND INTRAOCULAR LENS PLACEMENT ([place])  RIGHT TORIC;  Surgeon: Leandrew Koyanagi, MD;  Location: [facility];  Service: Ophthalmology;  Laterality: Right;  [place] no Toric Lens 1:45 5.3   COLONOSCOPY WITH PROPOFOL N/A 07/20/2018   Procedure: COLONOSCOPY WITH PROPOFOL;  Surgeon: Jonathon Bellows, MD;  Location: [facility] ENDOSCOPY;  Service: Gastroenterology;  Laterality: N/A;   ESOPHAGOGASTRODUODENOSCOPY (EGD) WITH PROPOFOL N/A 07/20/2018   Procedure: ESOPHAGOGASTRODUODENOSCOPY (EGD) WITH PROPOFOL;  Surgeon: Jonathon Bellows, MD;  Location: [facility] ENDOSCOPY;  Service: Gastroenterology;  Laterality:  N/A;   EYE SURGERY Right    HERNIA REPAIR     L Lap herniorraphy  04/2000   Left wrist ganglionectomy      Home Medications:  Allergies as of 08/07/2021       Reactions   Bactrim [sulfamethoxazole-trimethoprim]    Elevated creatinine   Clonidine Derivatives    Low platelets, edema   Tadalafil    Intolerant,  Didn't feel well on med        Medication List        Accurate as of August 07, 2021  2:06 PM. If you have any questions, ask your nurse or doctor.          acetaminophen 325 MG tablet Commonly known as: TYLENOL Take 2 tablets (650 mg total) by mouth See admin instructions. 1 hour prior to chemotherapy treatments   amLODipine 5 MG tablet Commonly known as: NORVASC TAKE 2 TABLETS BY MOUTH EVERY DAY   ASPIRIN 81 PO Take 81 mg by mouth daily.   chlorhexidine 0.12 % solution Commonly known as: Peridex Use as directed 15 mLs  in the mouth or throat 2 (two) times daily.   co-enzyme Q-10 50 MG capsule Take 50 mg by mouth daily.   [employer] VITAMIN B12 1000 MCG tablet Generic drug: cyanocobalamin TAKE 1 TABLET BY MOUTH EVERY DAY   cyclobenzaprine 5 MG tablet Commonly known as: FLEXERIL Take 1 tablet (5 mg total) by mouth daily as needed for muscle spasms.   dexamethasone 4 MG tablet Commonly known as: DECADRON Take 5 tablets (20 mg total) by mouth once a week. Take at least 1 hour prior to infusion appt. Take with food. To start on 06/07/21.   diphenhydrAMINE 50 MG tablet Commonly known as: BENADRYL Take 1 tablet (50 mg total) by mouth See admin instructions. Take 1 tablet 1 hour prior to chemotherapy treatments.   hydrALAZINE 10 MG tablet Commonly known as: APRESOLINE TAKE 1 TABLET (10 MG TOTAL) BY MOUTH 3 (THREE) TIMES DAILY.   lisinopril 20 MG tablet Commonly known as: ZESTRIL Take 1 tablet (20 mg total) by mouth daily. Please keep appt on 07/05/21   loperamide 2 MG capsule Commonly known as: IMODIUM Take 1 capsule (2 mg total) by mouth See admin instructions. Take 2 capsules at the onset of diarrhea, then 1 capsule every 2 hours or after every loose bowel movement. Maximum 8 capsules per 24 hours.   LORazepam 1 MG tablet Commonly known as: ATIVAN TAKE 1 TABLET (1 MG TOTAL) BY MOUTH EVERY 6 (SIX) HOURS AS NEEDED FOR ANXIETY (FOR NAUSEA)   magnesium chloride 64 MG Tbec SR tablet Commonly known as: SLOW-MAG Take 1 tablet (64 mg total) by mouth daily.   metoprolol succinate 50 MG 24 hr tablet Commonly known as: TOPROL-XL TAKE 4 TABLETS (200 MG TOTAL) BY MOUTH DAILY. TAKE WITH OR IMMEDIATELY FOLLOWING A MEAL.   montelukast 10 MG tablet Commonly known as: Singulair Take 1 tablet (10 mg total) by mouth See admin instructions. Take 1day prior to chemotherapy   ondansetron 8 MG tablet Commonly known as: ZOFRAN Take 1 tablet (8 mg total) by mouth every 8 (eight) hours as needed. for nausea    pomalidomide 2 MG capsule Commonly known as: POMALYST Take 1 capsule (2 mg total) by mouth daily. Take for 21 days on, then hold for 7 days. Repeat every 28 days.   pravastatin 10 MG tablet Commonly known as: PRAVACHOL Take 1 tablet (10 mg total) by mouth daily.   prochlorperazine  10 MG tablet Commonly known as: COMPAZINE   sildenafil 20 MG tablet Commonly known as: REVATIO Take 1-5 tablets (20-100 mg total) by mouth daily as needed.   VITAMIN B COMPLEX PO SMARTSIG:By Mouth   zinc gluconate 50 MG tablet Take 50 mg by mouth daily.        Allergies:  Allergies  Allergen Reactions   Bactrim [Sulfamethoxazole-Trimethoprim]     Elevated creatinine   Clonidine Derivatives     Low platelets, edema   Tadalafil     Intolerant,  Didn't feel well on med    Family History: Family History  Problem Relation Age of Onset   Hypertension Mother    Diabetes Mother    Heart disease Mother        CAD   Dementia Mother    Prostate cancer Father    Hypertension Father    Heart disease Father        MI 02/03   Colon cancer Father    Hypertension Sister    Hypertension Sister    Hypertension Sister     Social History:  reports that he has never smoked. He has never used smokeless tobacco. He reports that he does not currently use alcohol after a past usage of about 3.0 standard drinks per week. He reports that he does not use drugs.   Physical Exam: BP (!) 159/106   Pulse 75   Ht 5' 10"  (1.778 m)   Wt 208 lb (94.3 kg)   BMI 29.84 kg/m   Constitutional:  Alert and oriented, No acute distress. HEENT: [place] AT, moist mucus membranes.  Trachea midline, no masses. Cardiovascular: No clubbing, cyanosis, or edema. Respiratory: Normal respiratory effort, no increased work of breathing. Rectal: Normal sphincter tone,  approx. 50  CC prostate, rubbery nodules they are asymmetric  Skin: No rashes, bruises or suspicious lesions. Neurologic: Grossly intact, no focal deficits, moving  all 4 extremities. Psychiatric: Normal mood and affect.  Laboratory Data:  Lab Results  Component Value Date   CREATININE 1.39 (H) 08/02/2021    Lab Results  Component Value Date   PSA 3.05 06/21/2013   PSA 3.37 04/27/2012   PSA 2.84 12/13/2009    Lab Results  Component Value Date   TESTOSTERONE 355.93 11/16/2015    Assessment & Plan:   Elevated PSA  - We reviewed the implications of an elevated PSA and the uncertainty surrounding it. In general, a man's PSA increases with age and is produced by both normal and cancerous prostate tissue. Differential for elevated PSA is BPH, prostate cancer, infection, recent intercourse/ejaculation, prostate infarction, recent urethroscopic manipulation (foley placement/cystoscopy) and prostatitis. Management of an elevated PSA can include observation or prostate biopsy and wediscussed this in detail. We discussed that indications for prostate biopsy are defined by age and race specific PSA cutoffs as well as a PSA velocity of 0.75/year.  - We discussed prostate biopsy in detail including the procedure itself, the risks of blood in the urine, stool, and ejaculate, serious infection, and discomfort. He is willing to proceed with this as discussed. May need to be taken off of Aspirin if he needs a prostate biopsy. Will further discuss this with Dr. Tasia Catchings   - PSA; pending   - Exam today showed rubbery asymmetric nodules that are ost consistent with BPH.    Gevena Mart Littlejohn,acting as a scribe for Hollice Espy, MD.,have documented all relevant documentation on the behalf of Hollice Espy, MD,as directed by  Hollice Espy, MD while in the  presence of Hollice Espy, MD.  I have reviewed the above documentation for accuracy and completeness, and I agree with the above.   Hollice Espy, MD   [facility] [address], [place], [place] [address] [phone]

## 2021-08-07 ENCOUNTER — Other Ambulatory Visit: Payer: Self-pay

## 2021-08-07 ENCOUNTER — Ambulatory Visit (INDEPENDENT_AMBULATORY_CARE_PROVIDER_SITE_OTHER): Payer: Medicare Other | Admitting: Podiatry

## 2021-08-07 ENCOUNTER — Ambulatory Visit (INDEPENDENT_AMBULATORY_CARE_PROVIDER_SITE_OTHER): Payer: Medicare Other

## 2021-08-07 ENCOUNTER — Ambulatory Visit (INDEPENDENT_AMBULATORY_CARE_PROVIDER_SITE_OTHER): Payer: Medicare Other | Admitting: Urology

## 2021-08-07 ENCOUNTER — Encounter: Payer: Self-pay | Admitting: Oncology

## 2021-08-07 ENCOUNTER — Encounter: Payer: Self-pay | Admitting: Urology

## 2021-08-07 VITALS — BP 159/106 | HR 75 | Ht 70.0 in | Wt 208.0 lb

## 2021-08-07 DIAGNOSIS — M84374A Stress fracture, right foot, initial encounter for fracture: Secondary | ICD-10-CM

## 2021-08-07 DIAGNOSIS — S9031XA Contusion of right foot, initial encounter: Secondary | ICD-10-CM

## 2021-08-07 DIAGNOSIS — R972 Elevated prostate specific antigen [PSA]: Secondary | ICD-10-CM | POA: Diagnosis not present

## 2021-08-07 NOTE — Patient Instructions (Signed)

## 2021-08-08 ENCOUNTER — Ambulatory Visit: Payer: Medicare Other | Admitting: Physical Therapy

## 2021-08-08 ENCOUNTER — Telehealth: Payer: Self-pay | Admitting: *Deleted

## 2021-08-08 ENCOUNTER — Encounter: Payer: Self-pay | Admitting: Physical Therapy

## 2021-08-08 ENCOUNTER — Encounter: Payer: Self-pay | Admitting: Urology

## 2021-08-08 DIAGNOSIS — R972 Elevated prostate specific antigen [PSA]: Secondary | ICD-10-CM

## 2021-08-08 DIAGNOSIS — M542 Cervicalgia: Secondary | ICD-10-CM | POA: Diagnosis not present

## 2021-08-08 LAB — PSA: Prostate Specific Ag, Serum: 5.9 ng/mL — ABNORMAL HIGH (ref 0.0–4.0)

## 2021-08-08 NOTE — Telephone Encounter (Addendum)
Patient informed, voiced understanding. Labs placed and follow up scheduled.    ----- Message from Hollice Espy, MD sent at 08/08/2021  7:59 AM EDT ----- PSA is trending down in the right direction which is really reassuring.  Lets have you repeat it again in 3 months and follow-up with me thereafter.  Hollice Espy, MD

## 2021-08-08 NOTE — Therapy (Signed)
[facility] [address] [place], [address] Phone: [phone]   Fax:  [phone]  Physical Therapy Treatment  Patient Details  Name: Luis Burke MRN: [id number] Date of Birth: Jan 13, 1956 No data recorded  Encounter Date: 08/08/2021   PT End of Session - 08/08/21 0849     Visit Number 4    Number of Visits 17    Date for PT Re-Evaluation 09/24/21    Authorization - Visit Number 4    Progress Note Due on Visit 10    PT Start Time 0824    PT Stop Time 0902    PT Time Calculation (min) 38 min    Activity Tolerance Patient tolerated treatment well    Behavior During Therapy [facility] for tasks assessed/performed             Past Medical History:  Diagnosis Date   AKI (acute kidney injury) ([place]) 01/06/2019   Anemia    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   Fatty liver    on u/s 07/2018   Hyperlipidemia 03/2004   Hypertension 1994   Multiple myeloma ([place])    Snores     Past Surgical History:  Procedure Laterality Date   CATARACT EXTRACTION W/PHACO Right 03/04/2018   Procedure: CATARACT EXTRACTION PHACO AND INTRAOCULAR LENS PLACEMENT ([place])  RIGHT TORIC;  Surgeon: Leandrew Koyanagi, MD;  Location: [facility];  Service: Ophthalmology;  Laterality: Right;  [place] no Toric Lens 1:45 5.3   COLONOSCOPY WITH PROPOFOL N/A 07/20/2018   Procedure: COLONOSCOPY WITH PROPOFOL;  Surgeon: Jonathon Bellows, MD;  Location: [facility] ENDOSCOPY;  Service: Gastroenterology;  Laterality: N/A;   ESOPHAGOGASTRODUODENOSCOPY (EGD) WITH PROPOFOL N/A 07/20/2018   Procedure: ESOPHAGOGASTRODUODENOSCOPY (EGD) WITH PROPOFOL;  Surgeon: Jonathon Bellows, MD;  Location: [facility] ENDOSCOPY;  Service: Gastroenterology;  Laterality: N/A;   EYE SURGERY Right    HERNIA REPAIR     L Lap herniorraphy  04/2000   Left wrist ganglionectomy      There were no vitals filed for this  visit.   Subjective Assessment - 08/08/21 0830     Subjective Patient reports minimal neck pain this am, just some soreness. Feels like his mobility is better as well. Is completing HEP well with no issues    Pertinent History Luis Burke is a 65 year old male with primary c/o cervical pain with L side > R secondary to numbness in the R foot. He reports his neck pain began in 2019 without MOI but has experienced radiating pain down L UE. He reports difficulty with sitting, lifting, and performing household chores. He reports having a history of repetitive lifitng and now reports having a lifttng restriction of 10 pounds. He states his pain is alleviated with rest, change of position, and the use of salonpas patch. He reports having difficulty sleeping due to pain but it s usually able to reposition and return to sleep. He reports his pain at best is 0/10 and 5-6/10 at worst. Pt denies any unexplained weight fluctuation, saddle paresthesia, loss of bowel/bladder function, or unrelenting night pain at this time. Pt is currently undergoing chemotherapy treatment for multiple myeloma ever Thursday and reports taking a steroid Dexamethosone weekly. Pt has a past medical history of bone marrow transplant is being followed by [facility] oncology. Pt other PMH HTN, hyperlipidemia, lumbar stenosis, and anemia.    Limitations Lifting;Sitting;House hold activities    How  long can you sit comfortably? 45 min    How long can you stand comfortably? hours    How long can you walk comfortably? 15-7mn    Diagnostic tests xray-DJD    Patient Stated Goals Reduce radiating symptoms and reduce neck pain    Pain Onset More than a month ago             Therex  Nu Step L1 x 5 min UE only seat 8 SPM > 75   Prone chest lift with hands behind head, cuing for scapular retraction 2x 10    Seated row 25# 3x 10 with focus on maintained cervical retraction and scapular retraction without excessive elevation   Thoracic  Rotation on wall w/ concordant cervical rotation with ball at hip 1 x 12 reps each direction    Upper Trap stretch 3 x 30 sec each side     Manual Therapy Sub Occipital release 1 x 60 sec    Cervical Distraction 6 bouts 10sec on/10sec off Distraction with rotation and G3 mob x12 to each side   *manual therapy performed in supine for increased tissue extensibility and pain modulation.   (Prone) CPA/UPA R&L C3-C7 3 bouts x 30 grade 3 mobilizations with decreased sensitivity and increase in mobility                             PT Education - 08/08/21 0848     Education Details therex form/technique    Person(s) Educated Patient    Methods Explanation;Demonstration;Verbal cues    Comprehension Verbalized understanding;Returned demonstration;Verbal cues required              PT Short Term Goals - 07/30/21 1115       PT SHORT TERM GOAL #1   Title Pt will demonstrate independence with HEP to improve cervical function for increased ability to participate with ADLs    Baseline HEP given    Time 4    Period Weeks    Status New    Target Date 08/27/21               PT Long Term Goals - 07/30/21 1241       PT LONG TERM GOAL #1   Title Patient will increase FOTO score to ** to demonstrate predicted increase in functional mobility to complete ADLs    Baseline 07/30/21:    Time 8    Period Weeks    Status New    Target Date 09/24/21      PT LONG TERM GOAL #2   Title Pt will decrease worst cervical pain as reported on NPRS by at least 2 points in order to demonstrate clinically significant reduction in cercvical pain.    Baseline 07/30/21: 6/10    Time 8    Period Weeks    Status New    Target Date 09/24/21      PT LONG TERM GOAL #3   Title Pt will improve active cervical rotation to 60 degrees and lateral flexion to 45 in order to drive.    Baseline Rotation 45 deg; lateral flexion approximately 20 deg    Time 8    Status New    Target  Date 09/24/21                   Plan - 08/08/21 1210     Clinical Impression Statement PT continued to utilize manual techniques for increased cervical rotation  with success. Full ROM following manual therapy with subjective report of feeling "looser" (prior missing approx 10d of rotation). PT continued therex progression for incresed periscapular and DNF strength, and increased cervical and thoracic mobility with success. Patient is able to comply with all cuing for proper technique of therex with good motivation throughout session. PT will continue progression as able.    Personal Factors and Comorbidities Age;Time since onset of injury/illness/exacerbation;Comorbidity 2    Comorbidities Multiple myeloma, chemotherapy, obesity    Examination-Activity Limitations Sit;Sleep;Lift    Examination-Participation Restrictions Driving;Cleaning;Community Activity    Stability/Clinical Decision Making Stable/Uncomplicated    Clinical Decision Making Moderate    Rehab Potential Good    PT Frequency 2x / week    PT Duration 8 weeks    PT Treatment/Interventions ADLs/Self Care Home Management;Cryotherapy;Traction;Therapeutic exercise;Therapeutic activities;Patient/family education;Manual techniques;Passive range of motion;Dry needling;Spinal Manipulations;Joint Manipulations;Neuromuscular re-education;Electrical Stimulation    PT Next Visit Plan manual therapy and cervical mobility    PT Home Exercise Plan chin tucks, Snags, Upper traps stretch    Consulted and Agree with Plan of Care Patient             Patient will benefit from skilled therapeutic intervention in order to improve the following deficits and impairments:  Hypomobility, Pain, Postural dysfunction, Decreased range of motion, Obesity  Visit Diagnosis: Neck pain     Problem List Patient Active Problem List   Diagnosis Date Noted   Medicare welcome exam 07/26/2021   Chemotherapy induced neutropenia ([place]) 07/19/2021    Bone lesion 06/05/2021   Chronic midline low back pain without sciatica 06/05/2021   Neck pain, chronic 08/11/2020   Lumbar degenerative disc disease 08/10/2020   Lumbar foraminal stenosis 08/09/2020   Thrombocytopenia ([place]) 07/20/2020   Encounter for antineoplastic chemotherapy 07/20/2020   Stage 3a chronic kidney disease ([place]) 07/20/2020   Productive cough 03/08/2020   Carotid arterial disease ([place]) 11/26/2019   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[place])    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/23/2019   Leg pain 03/03/2019   AKI (acute kidney injury) ([place]) 01/06/2019   Multiple myeloma ([place]) 08/26/2018   Goals of care, counseling/discussion 08/26/2018   Fatty liver 07/29/2018   Anemia 07/06/2018   Creatinine elevation 07/06/2018   Headache 07/02/2018   Snoring 06/10/2018   FH: colon cancer 06/10/2018   Advance care planning 11/24/2015   Routine general medical examination at a health care facility 04/29/2012   ESSENTIAL HYPERTENSION, BENIGN 01/20/2008   HLD (hyperlipidemia) 06/02/2007   ERECTILE DYSFUNCTION, 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 [place], [address] Phone: [phone]   Fax:  [phone]  Name: DRACEN REIGLE MRN: [id number] Date of Birth: 09-20-1956

## 2021-08-13 ENCOUNTER — Ambulatory Visit: Payer: Medicare Other | Admitting: Physical Therapy

## 2021-08-13 ENCOUNTER — Encounter: Payer: Self-pay | Admitting: Physical Therapy

## 2021-08-13 DIAGNOSIS — M542 Cervicalgia: Secondary | ICD-10-CM

## 2021-08-13 NOTE — Therapy (Signed)
[facility] [address], [place], [address] Phone: [phone]   Fax:  [phone]  Physical Therapy Treatment  Patient Details  Name: Luis Burke MRN: [id number] Date of Birth: 02/01/1956 No data recorded  Encounter Date: 08/13/2021   PT End of Session - 08/13/21 0816     Visit Number 5    Number of Visits 17    Date for PT Re-Evaluation 09/24/21    Authorization - Visit Number 5    Progress Note Due on Visit 10    PT Start Time 0817    PT Stop Time 0855    PT Time Calculation (min) 38 min    Activity Tolerance Patient tolerated treatment well    Behavior During Therapy [facility] for tasks assessed/performed             Past Medical History:  Diagnosis Date   AKI (acute kidney injury) ([place]) 01/06/2019   Anemia    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   Fatty liver    on u/s 07/2018   Hyperlipidemia 03/2004   Hypertension 1994   Multiple myeloma ([place])    Snores     Past Surgical History:  Procedure Laterality Date   CATARACT EXTRACTION W/PHACO Right 03/04/2018   Procedure: CATARACT EXTRACTION PHACO AND INTRAOCULAR LENS PLACEMENT ([place])  RIGHT TORIC;  Surgeon: Leandrew Koyanagi, MD;  Location: [place];  Service: Ophthalmology;  Laterality: Right;  [place] no Toric Lens 1:45 5.3   COLONOSCOPY WITH PROPOFOL N/A 07/20/2018   Procedure: COLONOSCOPY WITH PROPOFOL;  Surgeon: Jonathon Bellows, MD;  [facility] ENDOSCOPY;  Service: Gastroenterology;  Laterality: N/A;   ESOPHAGOGASTRODUODENOSCOPY (EGD) WITH PROPOFOL N/A 07/20/2018   Procedure: ESOPHAGOGASTRODUODENOSCOPY (EGD) WITH PROPOFOL;  Surgeon: Jonathon Bellows, MD;  Location[facility] ENDOSCOPY;  Service: Gastroenterology;  Laterality: N/A;   EYE SURGERY Right    HERNIA REPAIR     L Lap herniorraphy  04/2000   Left wrist ganglionectomy      There were no vitals filed for this  visit.   Subjective Assessment - 08/13/21 0821     Subjective Patient reports feeling approximately 60% better. Patient reports tightness on L side of neck. Pt reports HEP is coming along well.    Pertinent History Luis Burke is a 65 year old male with primary c/o cervical pain with L side > R secondary to numbness in the R foot. He reports his neck pain began in 2019 without MOI but has experienced radiating pain down L UE. He reports difficulty with sitting, lifting, and performing household chores. He reports having a history of repetitive lifitng and now reports having a lifttng restriction of 10 pounds. He states his pain is alleviated with rest, change of position, and the use of salonpas patch. He reports having difficulty sleeping due to pain but it s usually able to reposition and return to sleep. He reports his pain at best is 0/10 and 5-6/10 at worst. Pt denies any unexplained weight fluctuation, saddle paresthesia, loss of bowel/bladder function, or unrelenting night pain at this time. Pt is currently undergoing chemotherapy treatment for multiple myeloma ever Thursday and reports taking a steroid Dexamethosone weekly. Pt has a past medical history of bone marrow transplant is being followed by [facility] oncology. Pt other PMH HTN, hyperlipidemia, lumbar stenosis, and anemia.    Limitations Lifting;Sitting;House hold activities    How long can you sit  comfortably? 45 min    How long can you stand comfortably? hours    How long can you walk comfortably? 15-64mn    Diagnostic tests xray-DJD    Patient Stated Goals Reduce radiating symptoms and reduce neck pain               Therex  Nu Step L2 x 5 min UE only seat 8 SPM > 75   Bent Over row 15# 3x 10 with focus on maintained cervical retraction and scapular retraction without excessive elevation   Seated Thoracic Extension 1 x 12 reps with half foam roller   Thoracic Rotation on wall w/ concordant cervical rotation with ball at  hip 1 x 12 reps each direction    Upper Trap stretch 3 x 30 sec each side     Manual Therapy Sub Occipital release 1 x 60 sec    Cervical Distraction 6 bouts 10sec on/10sec off  Cervical side glide G3 mob x12 to each side   *manual therapy performed in supine for increased tissue extensibility and pain modulation.   (Prone) CPA/UPA R&L C3-C7 3 bouts x 30 grade 3 mobilizations with decreased sensitivity and increase in mobility                           PT Education - 08/13/21 0857     Education Details therex form/technique    Person(s) Educated Patient    Methods Explanation;Demonstration    Comprehension Verbalized understanding;Returned demonstration              PT Short Term Goals - 07/30/21 1115       PT SHORT TERM GOAL #1   Title Pt will demonstrate independence with HEP to improve cervical function for increased ability to participate with ADLs    Baseline HEP given    Time 4    Period Weeks    Status New    Target Date 08/27/21               PT Long Term Goals - 07/30/21 1241       PT LONG TERM GOAL #1   Title Patient will increase FOTO score to ** to demonstrate predicted increase in functional mobility to complete ADLs    Baseline 07/30/21:    Time 8    Period Weeks    Status New    Target Date 09/24/21      PT LONG TERM GOAL #2   Title Pt will decrease worst cervical pain as reported on NPRS by at least 2 points in order to demonstrate clinically significant reduction in cercvical pain.    Baseline 07/30/21: 6/10    Time 8    Period Weeks    Status New    Target Date 09/24/21      PT LONG TERM GOAL #3   Title Pt will improve active cervical rotation to 60 degrees and lateral flexion to 45 in order to drive.    Baseline Rotation 45 deg; lateral flexion approximately 20 deg    Time 8    Status New    Target Date 09/24/21                   Plan - 08/13/21 0858     Clinical Impression Statement Pt  tolerated session well with no reports of cervical pain following manual therapy and exercise. Pt utilized multimodal cueing to instruct therex with success. Pt overall reports decrease in  pain and demonstrates improvement with cervical mobility. Pt will continue to benefit from skilled therapy to ensure proper technique for continued safe and effective exercise progression.    Personal Factors and Comorbidities Age;Time since onset of injury/illness/exacerbation;Comorbidity 2    Comorbidities Multiple myeloma, chemotherapy, obesity    Examination-Activity Limitations Sit;Sleep;Lift    Examination-Participation Restrictions Driving;Cleaning;Community Activity    Stability/Clinical Decision Making Stable/Uncomplicated    Clinical Decision Making Moderate    Rehab Potential Good    PT Frequency 2x / week    PT Duration 8 weeks    PT Treatment/Interventions ADLs/Self Care Home Management;Cryotherapy;Traction;Therapeutic exercise;Therapeutic activities;Patient/family education;Manual techniques;Passive range of motion;Dry needling;Spinal Manipulations;Joint Manipulations;Neuromuscular re-education;Electrical Stimulation    PT Next Visit Plan manual therapy and cervical mobility    PT Home Exercise Plan chin tucks, Snags, Upper traps stretch    Consulted and Agree with Plan of Care Patient             Patient will benefit from skilled therapeutic intervention in order to improve the following deficits and impairments:  Hypomobility, Pain, Postural dysfunction, Decreased range of motion, Obesity  Visit Diagnosis: Neck pain     Problem List Patient Active Problem List   Diagnosis Date Noted   Medicare welcome exam 07/26/2021   Chemotherapy induced neutropenia ([place]) 07/19/2021   Bone lesion 06/05/2021   Chronic midline low back pain without sciatica 06/05/2021   Neck pain, chronic 08/11/2020   Lumbar degenerative disc disease 08/10/2020   Lumbar foraminal stenosis 08/09/2020    Thrombocytopenia ([place]) 07/20/2020   Encounter for antineoplastic chemotherapy 07/20/2020   Stage 3a chronic kidney disease ([place]) 07/20/2020   Productive cough 03/08/2020   Carotid arterial disease ([place]) 11/26/2019   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[place])    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[place]) 04/23/2019   Leg pain 03/03/2019   AKI (acute kidney injury) ([place]) 01/06/2019   Multiple myeloma ([place]) 08/26/2018   Goals of care, counseling/discussion 08/26/2018   Fatty liver 07/29/2018   Anemia 07/06/2018   Creatinine elevation 07/06/2018   Headache 07/02/2018   Snoring 06/10/2018   FH: colon cancer 06/10/2018   Advance care planning 11/24/2015   Routine general medical examination at a health care facility 04/29/2012   ESSENTIAL HYPERTENSION, BENIGN 01/20/2008   HLD (hyperlipidemia) 06/02/2007   ERECTILE DYSFUNCTION, 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 [facility] [address] [place], [address] Phone: [phone]   Fax:  [phone]  Name: MILOS MILLIGAN MRN: [id number] Date of Birth: 1955-10-25

## 2021-08-15 ENCOUNTER — Ambulatory Visit: Payer: Medicare Other | Attending: Oncology | Admitting: Physical Therapy

## 2021-08-15 ENCOUNTER — Encounter: Payer: Self-pay | Admitting: Physical Therapy

## 2021-08-15 DIAGNOSIS — M542 Cervicalgia: Secondary | ICD-10-CM | POA: Insufficient documentation

## 2021-08-15 NOTE — Therapy (Signed)
[facility] [address] [place], [address] Phone: [phone]   Fax:  [phone]  Physical Therapy Treatment  Patient Details  Name: Luis Burke MRN: [id number] Date of Birth: 24-Sep-1956 No data recorded  Encounter Date: 08/15/2021   PT End of Session - 08/15/21 0809     Visit Number 6    Number of Visits 17    Date for PT Re-Evaluation 09/24/21    Authorization - Visit Number 6    Progress Note Due on Visit 10    PT Start Time 0810    PT Stop Time 0848    PT Time Calculation (min) 38 min    Activity Tolerance Patient tolerated treatment well    Behavior During Therapy 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   Fatty liver    on u/s 07/2018   Hyperlipidemia 03/2004   Hypertension 1994   Multiple myeloma ([place])    Snores     Past Surgical History:  Procedure Laterality Date   CATARACT EXTRACTION W/PHACO Right 03/04/2018   Procedure: CATARACT EXTRACTION PHACO AND INTRAOCULAR LENS PLACEMENT ([place])  RIGHT TORIC;  Surgeon: Leandrew Koyanagi, MD;  Location: [place];  Service: Ophthalmology;  Laterality: Right;  [place] no Toric Lens 1:45 5.3   COLONOSCOPY WITH PROPOFOL N/A 07/20/2018   Procedure: COLONOSCOPY WITH PROPOFOL;  Surgeon: Jonathon Bellows, MD;  Location: [facility] ENDOSCOPY;  Service: Gastroenterology;  Laterality: N/A;   ESOPHAGOGASTRODUODENOSCOPY (EGD) WITH PROPOFOL N/A 07/20/2018   Procedure: ESOPHAGOGASTRODUODENOSCOPY (EGD) WITH PROPOFOL;  Surgeon: Jonathon Bellows, MD;  [facility]COPY;  Service: Gastroenterology;  Laterality: N/A;   EYE SURGERY Right    HERNIA REPAIR     L Lap herniorraphy  04/2000   Left wrist ganglionectomy      There were no vitals filed for this  visit.   Subjective Assessment - 08/15/21 0812     Subjective Pt denies any neck pain this visit but continues to have some soreness on the L posterior neck.    Pertinent History Luis Burke is a 65 year old male with primary c/o cervical pain with L side > R secondary to numbness in the R foot. He reports his neck pain began in 2019 without MOI but has experienced radiating pain down L UE. He reports difficulty with sitting, lifting, and performing household chores. He reports having a history of repetitive lifitng and now reports having a lifttng restriction of 10 pounds. He states his pain is alleviated with rest, change of position, and the use of salonpas patch. He reports having difficulty sleeping due to pain but it s usually able to reposition and return to sleep. He reports his pain at best is 0/10 and 5-6/10 at worst. Pt denies any unexplained weight fluctuation, saddle paresthesia, loss of bowel/bladder function, or unrelenting night pain at this time. Pt is currently undergoing chemotherapy treatment for multiple myeloma ever Thursday and reports taking a steroid Dexamethosone weekly. Pt has a past medical history of bone marrow transplant is being [facility] by [facility]ology. Pt other PMH HTN, hyperlipidemia, lumbar stenosis, and anemia.    Limitations Lifting;Sitting;House hold activities    How long can you sit comfortably? 45 min  How long can you stand comfortably? hours    How long can you walk comfortably? 15-77mn    Diagnostic tests xray-DJD    Patient Stated Goals Reduce radiating symptoms and reduce neck pain              Therex   Nu Step L2 x 5 min UE only seat 8 SPM > 75   Omega row 35# 3x 10 with focus on maintained cervical retraction and scapular retraction without excessive elevation  Push up plus from modified plantigrade position 3 x 8 reps with emphasis on scapular protraction and cervical retraction     Open book with L<>R T spine rotation 1 x 12 reps x  3-5 sec hold with cues and demonstration for initial setup     Manual Therapy Sub Occipital release 1 x 60 sec    Cervical Distraction 6 bouts 10sec on/10sec off  Distraction with rotation and G3 mob x12 to each side  Levator stretch 2 x 30 sec  Cervical extensor stretch 2 x 30 sec    *manual therapy performed in supine for increased tissue extensibility and pain modulation.                            PT Education - 08/15/21 0809     Education Details therex form/technique    Person(s) Educated Patient    Methods Explanation;Demonstration    Comprehension Verbalized understanding;Returned demonstration              PT Short Term Goals - 07/30/21 1115       PT SHORT TERM GOAL #1   Title Pt will demonstrate independence with HEP to improve cervical function for increased ability to participate with ADLs    Baseline HEP given    Time 4    Period Weeks    Status New    Target Date 08/27/21               PT Long Term Goals - 07/30/21 1241       PT LONG TERM GOAL #1   Title Patient will increase FOTO score to ** to demonstrate predicted increase in functional mobility to complete ADLs    Baseline 07/30/21:    Time 8    Period Weeks    Status New    Target Date 09/24/21      PT LONG TERM GOAL #2   Title Pt will decrease worst cervical pain as reported on NPRS by at least 2 points in order to demonstrate clinically significant reduction in cercvical pain.    Baseline 07/30/21: 6/10    Time 8    Period Weeks    Status New    Target Date 09/24/21      PT LONG TERM GOAL #3   Title Pt will improve active cervical rotation to 60 degrees and lateral flexion to 45 in order to drive.    Baseline Rotation 45 deg; lateral flexion approximately 20 deg    Time 8    Status New    Target Date 09/24/21                   Plan - 08/15/21 0850     Clinical Impression Statement PT continued cervical and thoracic mobility and  periscapular strengthening this visit. Pt tolerated session well with reports of decreased cervical tightness following manual therapy and exercise. Pt continues to demonstrate increase active cervical ROM with decreased pain.  Pt will continue to benefit from skilled therapy to ensure proper form and technique and achieve set goals.    Personal Factors and Comorbidities Age;Time since onset of injury/illness/exacerbation;Comorbidity 2    Comorbidities Multiple myeloma, chemotherapy, obesity    Examination-Activity Limitations Sit;Sleep;Lift    Examination-Participation Restrictions Driving;Cleaning;Community Activity    Stability/Clinical Decision Making Stable/Uncomplicated    Clinical Decision Making Moderate    Rehab Potential Good    PT Frequency 2x / week    PT Duration 8 weeks    PT Treatment/Interventions ADLs/Self Care Home Management;Cryotherapy;Traction;Therapeutic exercise;Therapeutic activities;Patient/family education;Manual techniques;Passive range of motion;Dry needling;Spinal Manipulations;Joint Manipulations;Neuromuscular re-education;Electrical Stimulation    PT Next Visit Plan manual therapy and cervical mobility    PT Home Exercise Plan chin tucks, Snags, Upper traps stretch    Consulted and Agree with Plan of Care Patient             Patient will benefit from skilled therapeutic intervention in order to improve the following deficits and impairments:  Hypomobility, Pain, Postural dysfunction, Decreased range of motion, Obesity  Visit Diagnosis: Neck pain     Problem List Patient Active Problem List   Diagnosis Date Noted   Medicare welcome exam 07/26/2021   Chemotherapy induced neutropenia ([place]) 07/19/2021   Bone lesion 06/05/2021   Chronic midline low back pain without sciatica 06/05/2021   Neck pain, chronic 08/11/2020   Lumbar degenerative disc disease 08/10/2020   Lumbar foraminal stenosis 08/09/2020   Thrombocytopenia ([place]) 07/20/2020   Encounter for  antineoplastic chemotherapy 07/20/2020   Stage 3a chronic kidney disease ([place]) 07/20/2020   Productive cough 03/08/2020   Carotid arterial disease ([place]) 11/26/2019   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[place])    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[place]) 04/23/2019   Leg pain 03/03/2019   AKI (acute kidney injury) ([place]) 01/06/2019   Multiple myeloma ([place]) 08/26/2018   Goals of care, counseling/discussion 08/26/2018   Fatty liver 07/29/2018   Anemia 07/06/2018   Creatinine elevation 07/06/2018   Headache 07/02/2018   Snoring 06/10/2018   FH: colon cancer 06/10/2018   Advance care planning 11/24/2015   Routine general medical examination at a health care facility 04/29/2012   ESSENTIAL HYPERTENSION, BENIGN 01/20/2008   HLD (hyperlipidemia) 06/02/2007   ERECTILE DYSFUNCTION, 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 [place], [address] Phone: [phone]   Fax:  [phone]  Name: Luis Burke MRN: [id number] Date of Birth: 1956-06-30

## 2021-08-16 ENCOUNTER — Encounter: Payer: Self-pay | Admitting: Oncology

## 2021-08-16 ENCOUNTER — Inpatient Hospital Stay: Payer: Medicare Other

## 2021-08-16 ENCOUNTER — Telehealth: Payer: Self-pay | Admitting: Family Medicine

## 2021-08-16 ENCOUNTER — Other Ambulatory Visit: Payer: Self-pay | Admitting: Family Medicine

## 2021-08-16 ENCOUNTER — Inpatient Hospital Stay: Payer: Medicare Other | Attending: Oncology

## 2021-08-16 ENCOUNTER — Ambulatory Visit
Admission: RE | Admit: 2021-08-16 | Discharge: 2021-08-16 | Disposition: A | Discharge status: Home / Self Care | Payer: Medicare Other | Source: Ambulatory Visit | Attending: Oncology | Admitting: Oncology

## 2021-08-16 ENCOUNTER — Other Ambulatory Visit: Payer: Self-pay

## 2021-08-16 ENCOUNTER — Inpatient Hospital Stay (HOSPITAL_BASED_OUTPATIENT_CLINIC_OR_DEPARTMENT_OTHER): Payer: Medicare Other | Admitting: Oncology

## 2021-08-16 VITALS — BP 134/88 | HR 66 | Temp 99.3°F | Wt 206.2 lb

## 2021-08-16 DIAGNOSIS — C9002 Multiple myeloma in relapse: Secondary | ICD-10-CM

## 2021-08-16 DIAGNOSIS — D696 Thrombocytopenia, unspecified: Secondary | ICD-10-CM

## 2021-08-16 DIAGNOSIS — Z5111 Encounter for antineoplastic chemotherapy: Secondary | ICD-10-CM

## 2021-08-16 DIAGNOSIS — T451X5A Adverse effect of antineoplastic and immunosuppressive drugs, initial encounter: Secondary | ICD-10-CM | POA: Diagnosis not present

## 2021-08-16 DIAGNOSIS — C9000 Multiple myeloma not having achieved remission: Secondary | ICD-10-CM

## 2021-08-16 DIAGNOSIS — Z5112 Encounter for antineoplastic immunotherapy: Secondary | ICD-10-CM | POA: Insufficient documentation

## 2021-08-16 DIAGNOSIS — D701 Agranulocytosis secondary to cancer chemotherapy: Secondary | ICD-10-CM

## 2021-08-16 LAB — CBC WITH DIFFERENTIAL/PLATELET
Abs Immature Granulocytes: 0.01 10*3/uL (ref 0.00–0.07)
Basophils Absolute: 0 10*3/uL (ref 0.0–0.1)
Basophils Relative: 0 %
Eosinophils Absolute: 0.7 10*3/uL — ABNORMAL HIGH (ref 0.0–0.5)
Eosinophils Relative: 15 %
HCT: 37.3 % — ABNORMAL LOW (ref 39.0–52.0)
Hemoglobin: 12.6 g/dL — ABNORMAL LOW (ref 13.0–17.0)
Immature Granulocytes: 0 %
Lymphocytes Relative: 23 %
Lymphs Abs: 1.1 10*3/uL (ref 0.7–4.0)
MCH: 34 pg (ref 26.0–34.0)
MCHC: 33.8 g/dL (ref 30.0–36.0)
MCV: 100.5 fL — ABNORMAL HIGH (ref 80.0–100.0)
Monocytes Absolute: 0.8 10*3/uL (ref 0.1–1.0)
Monocytes Relative: 17 %
Neutro Abs: 2 10*3/uL (ref 1.7–7.7)
Neutrophils Relative %: 45 %
Platelets: 70 10*3/uL — ABNORMAL LOW (ref 150–400)
RBC: 3.71 MIL/uL — ABNORMAL LOW (ref 4.22–5.81)
RDW: 13.9 % (ref 11.5–15.5)
WBC: 4.6 10*3/uL (ref 4.0–10.5)
nRBC: 0 % (ref 0.0–0.2)

## 2021-08-16 LAB — COMPREHENSIVE METABOLIC PANEL
ALT: 30 U/L (ref 0–44)
AST: 15 U/L (ref 15–41)
Albumin: 4.5 g/dL (ref 3.5–5.0)
Alkaline Phosphatase: 69 U/L (ref 38–126)
Anion gap: 9 (ref 5–15)
BUN: 23 mg/dL (ref 8–23)
CO2: 27 mmol/L (ref 22–32)
Calcium: 9.6 mg/dL (ref 8.9–10.3)
Chloride: 101 mmol/L (ref 98–111)
Creatinine, Ser: 1.25 mg/dL — ABNORMAL HIGH (ref 0.61–1.24)
GFR, Estimated: >60 mL/min (ref >60)
Glucose, Bld: 102 mg/dL — ABNORMAL HIGH (ref 70–99)
Potassium: 4.6 mmol/L (ref 3.5–5.1)
Sodium: 137 mmol/L (ref 135–145)
Total Bilirubin: 1 mg/dL (ref 0.3–1.2)
Total Protein: 6.8 g/dL (ref 6.5–8.1)

## 2021-08-16 IMAGING — CR DG BONE SURVEY MET
1 series · 10 of 10 positions shown · non-contrast
Comparison: [DATE] and previous

CLINICAL DATA: Multiple myeloma, in relapse

EXAM:
METASTATIC BONE SURVEY

[Series 1: dg bone survey met · 0.14mm/px · 10 of 22 slices shown]
[im 1/22]
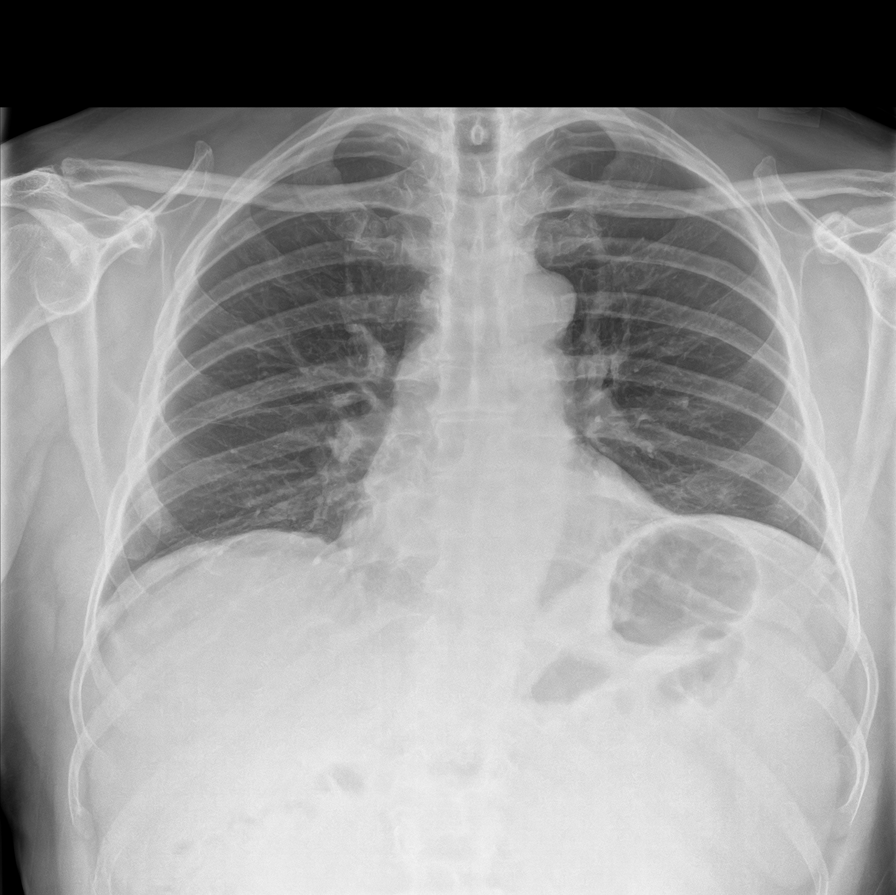
[im 3/22]
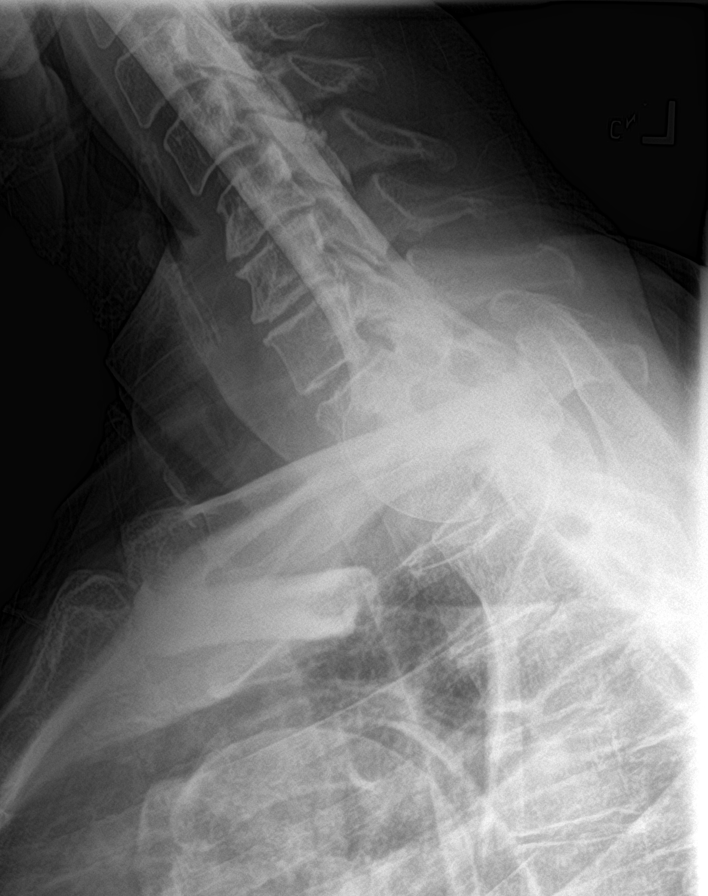
[im 5/22]
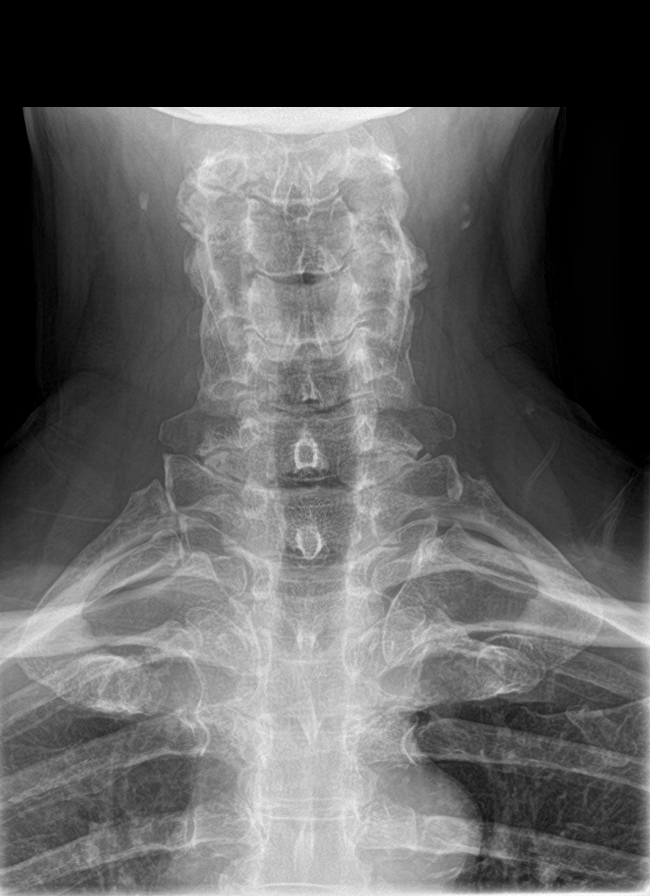
[im 8/22]
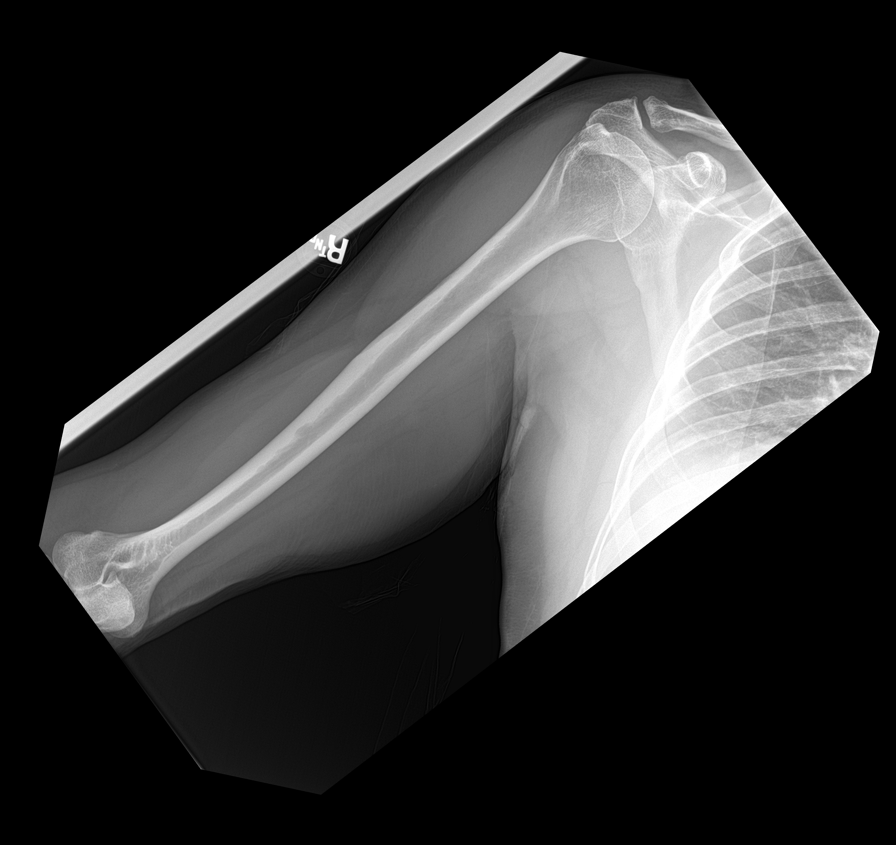
[im 10/22]
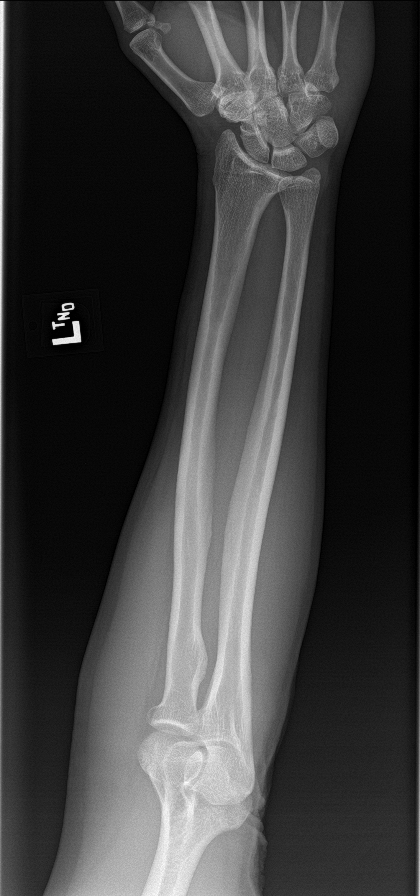
[im 12/22]
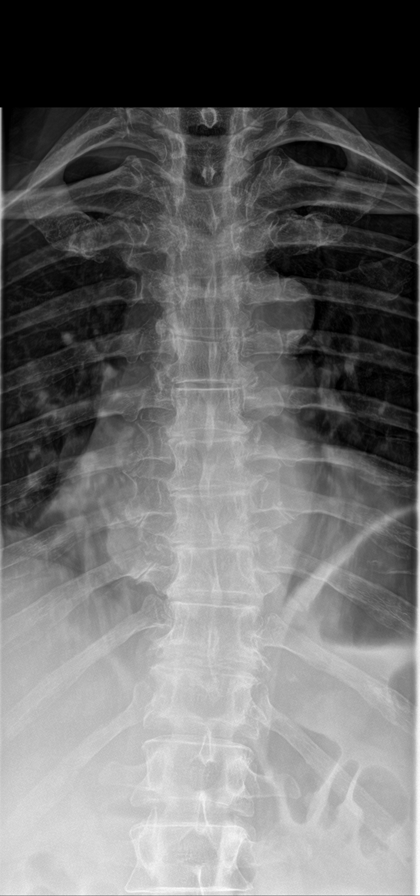
[im 15/22]
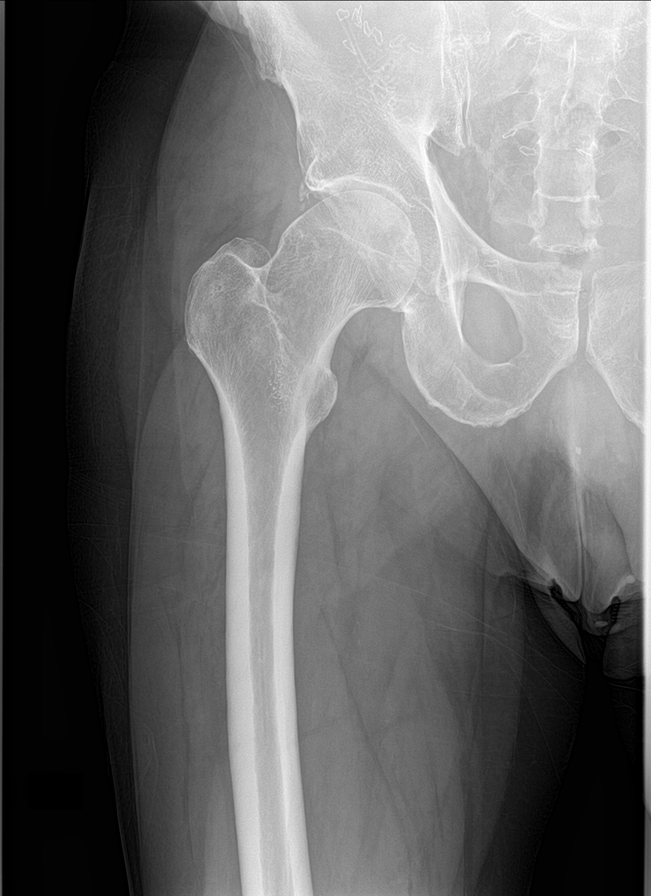
[im 17/22]
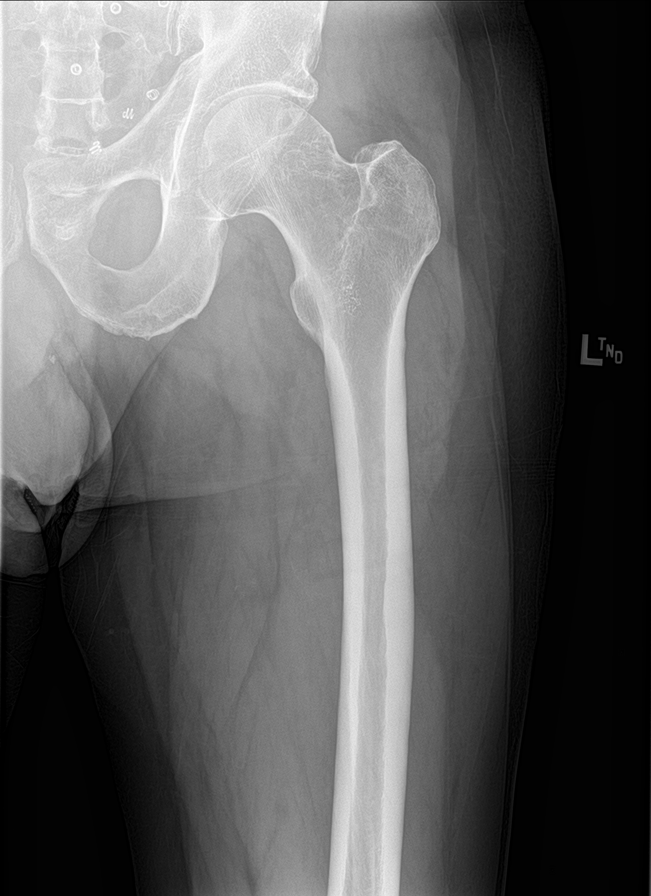
[im 19/22]
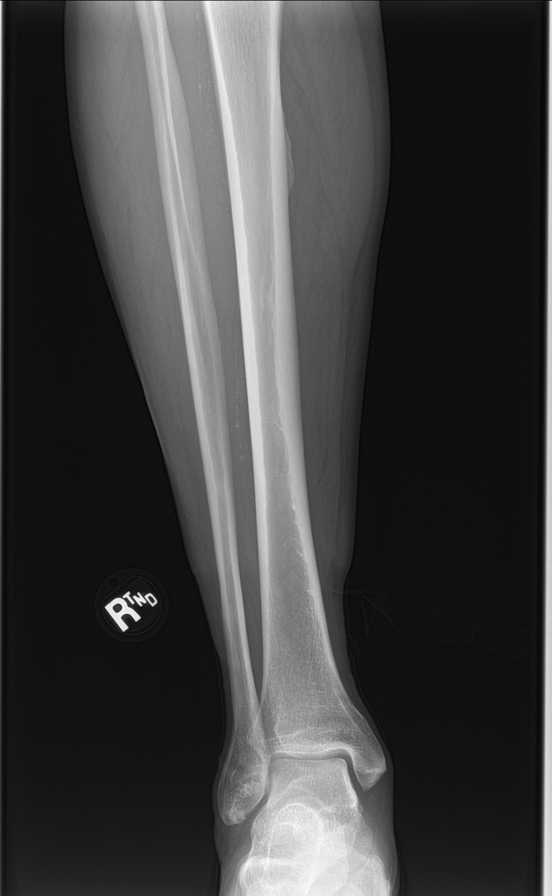
[im 22/22]
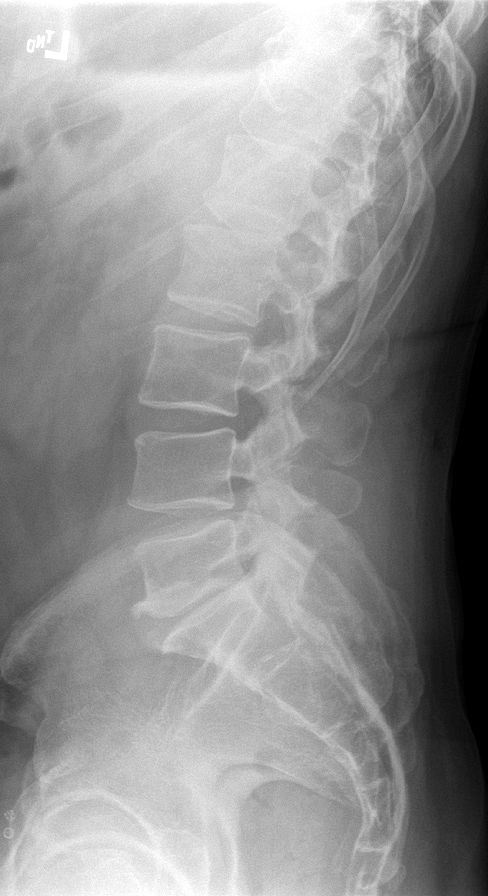

[10 of 10 positions shown; findings below may reference images not displayed]

FINDINGS: 5 mm lucency in the right scapula medial to the glenoid is stable
since [DATE]. [DATE] mm lucency in the mid right humeral shaft
stable. Ill-defined lucencies in the proximal right humeral shaft
are more conspicuous than on prior study.

Scattered degenerative changes including:

Cervical degenerative disc disease C4-C7.

Bilateral carotid bifurcation calcified plaque.

Bilateral hip  DJD .

Progressive degenerative disc disease in the lower thoracic spine,
and L5-S1.
IMPRESSION: 1. Stable small right humeral and scapular lucencies.
2. Ill-defined smaller lucencies in the proximal right humeral shaft
more conspicuous than on prior study of [DATE].
3. Chronic and degenerative changes as above.

## 2021-08-16 MED ORDER — DIPHENHYDRAMINE HCL 25 MG PO CAPS
50.0000 mg | ORAL_CAPSULE | Freq: Once | ORAL | Status: AC
  Administered 2021-08-16: 50 mg via ORAL
  Filled 2021-08-16: qty 2

## 2021-08-16 MED ORDER — ACETAMINOPHEN 325 MG PO TABS
650.0000 mg | ORAL_TABLET | Freq: Once | ORAL | Status: AC
  Administered 2021-08-16: 650 mg via ORAL
  Filled 2021-08-16: qty 2

## 2021-08-16 MED ORDER — DARATUMUMAB-HYALURONIDASE-FIHJ 1800-30000 MG-UT/15ML ~~LOC~~ SOLN
1800.0000 mg | Freq: Once | SUBCUTANEOUS | Status: AC
  Administered 2021-08-16: 1800 mg via SUBCUTANEOUS
  Filled 2021-08-16: qty 15

## 2021-08-16 NOTE — Progress Notes (Signed)
Hematology/Oncology  Follow up note [facility] [phone] [phone]   Patient Care Team: Tonia Ghent, MD as PCP - General (Family Medicine) Earlie Server, MD as Consulting Physician (Oncology) Beather Arbour Lilyan Punt, MD as Referring Physician (Hematology and Oncology) Diannia Ruder, MD as Referring Physician (Hematology and Oncology)  REASON FOR VISIT Follow up for multiple myeloma.   HISTORY OF PRESENTING ILLNESS:  Luis Burke is a  65 y.o.  male with PMH listed below presents for follow up of multiple myeloma.  # 07/27/2018 multiple myeloma panel showed M protein of 0.1, IgG 662, IgA 49, IgM 7. Patient was called back to further labs done. 08/10/2018, free light chain ratio showed extremely high level of kappa free light chain 10,183, with a kappa lambda light chain ratio of 1414.31 LDH 164 Beta-2 microglobulin 5 Patient was called and discuss about results.  He was recommended to undergo bone marrow biopsy and PET scan. 08/19/2018 bone marrow biopsy showed hypercellular marrow 80%, involved by plasma cell neoplasm up to 95%.  Consistent with plasma cell myeloma. Myeloma FISH  gain of gain of CEP12  09/10/2018 skeletal survey showed questionable small lucent lesions within the midshaft of the humerus bilaterally.  Otherwise no suspicious focal lytic lesion or acute bone abnormality. 08/25/2018 PET scan showed no definite hypermetabolic bone disease but CT findings are highly suspicious for numerous small myelomatous lesions involving the spine, sternum and scattered ribs.  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 He received preparative regimen with melphalan 200 mg/m on 04/22/2019 followed by autologous stem cell infusion on 04/23/2019. Currently he is transfusion independent.  Transfusion criteria with as needed hemoglobin less than 7.5 for platelet less than 10,000.  Transplant course was complicated with  febrile neutropenia grade 3, treated with vancomycin and cephapirin until engraftment on 05/06/2019. Patient also had engraftment syndrome and had to be started on Solu-Medrol 25 mg twice daily on 05/05/2019.  Transition to Medrol Dosepak on 05/07/2019 to complete steroid taper as outpatient.  Patient is currently on acyclovir for 1 year after transplant, dose was reduced on 722 11/20/2018 due to renal function. Patient had a baseline CKD with creatinine running between 1.4-1.6.  He had acute on chronic kidney injury and creatinine went up to 2.2 on 722.  Fluid hydration was given and creatinine decreased to baseline 1.5-1.7. His Hickman catheter was removed.  Candida Cruris treated with topical clotrimazole 1% 3 times daily and to resolution..  #  seen by Dr. Tonia Brooms BMT team on 06/30/2019.revaccination at [place] begins at 12 months post transplant I discussed with [facility] hematology Dr.Choi and he recommends plans as listed below.  -patient to be started on Ixazomib maintenance: -Ixazomib 63m on D1/D8/D15 out of 28 day cycle.  -patient will start re-vaccination 12 months post transplant.  -Post transplant bone marrow biopsy if clinically indicated.  # seen by [facility] bone marrow transplant team in June 2021.  Patient has been started on immunization protocol.  He reports no new complaints.  #[facility]  Lucent lesion in the L4 spinous process measuring 4 mm which is  nonspecific. Consider confirmation of marrow replacing process with MRI.  2.  Incompletely characterized renal cysts   # He continues to have chronic back pain and neck pain.  [facility], MRI lumbar spine with and without contrast showed no evidence of spinal metastasis.  Lumbar spondylosis is most pronounced  at L5-S1 where there is severe right and moderate left neural foraminal stenosis. 08/11/2020, x-ray cervical spine complete with flexion and extension showed The cervical spine  is visualized from C1 to the top of T1 on the lateral  view.  Reversal of the normal cervical lordosis with a focal kyphosis centered at the C5-C6 intervertebral level. Trace anterolisthesis of C4 onto C5. Trace retrolisthesis of C6 on C7. No evidence of dynamic instability is noted on  the flexion or extension views.   No prevertebral soft tissue swelling. Cervical vertebral body heights are maintained. Multilevel degenerative changes of the cervical spine including discogenic disease, most pronounced at C5-C6 and C6-C7, and facet arthropathy most pronounced at C4-C5. Multilevel mild to moderate left neural foraminal osseous narrowing of the mid to lower cervical vertebrae, possibly centrally/artifactual by patient positioning.  #07/28/2020 bone marrow normocellular marrow with polytypic plasmacytosis, 3 to 8%   Patient is in remission.  Cytogenetics negative for trisomy 12.  Negative myeloma FISH panel.   # Lumbar lesion,  11/13/20 [facility]  MRI cervical sine w/wo contrast - no evidence of metastatic disease C3-4 severe spinal stenosis, left neural foraminal narrowing. C4-5 spinal canal stenosis. [facility] MRI results showed no metastatic spine lesion. Chronic back pain and neck pain, images are consistent with chronic degenerative disease.  Patient continues to follow-up with [place] orthopedic surgeon.  04/03/2021, light chain ratio increased to 3.76.   05/15/2021 bone marrow biopsy showed slightly hypercellular bone marrow for age with slight plasmacytosis.  5% plasma cells with kappa light chain restriction.  Cytogenetics normal.  Myeloma FISH negative # 05/15/2021 bone marrow biopsy showed slightly hypercellular bone marrow for age with slight plasmacytosis.  5% plasma cells with kappa light chain restriction.  Cytogenetics normal.  Myeloma FISH negative  # 05/31/2021 Daratumumab -Pomalyst-dexamethasone-D1 patient had grade 2 infusion reaction to daratumumab and received steroids, Benadryl, famotidine.  He was  able to finish treatment.D8 was able to finish Daratumumab with no infusion reactions.  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   INTERVAL HISTORY Luis Burke is a 65 y.o. male who has above history reviewed by me today for follow-up  Currently on Daratumumab Pomalyst dexamethasone  patient reports feeling well today.  He had right foot pain with extension and dorsiflexion. No nausea vomiting diarrhea.    Review of Systems  Constitutional:  Negative for appetite change, chills, fatigue, fever and unexpected weight change.  HENT:   Negative for hearing loss and voice change.   Eyes:  Negative for eye problems and icterus.  Respiratory:  Negative for chest tightness, cough and shortness of breath.   Cardiovascular:  Negative for chest pain and leg swelling.  Gastrointestinal:  Negative for abdominal distention and abdominal pain.  Endocrine: Negative for hot flashes.  Genitourinary:  Negative for difficulty urinating, dysuria and frequency.   Musculoskeletal:  Positive for back pain and neck pain. Negative for arthralgias.  Skin:  Negative for itching and rash.  Neurological:  Negative for light-headedness and numbness.  Hematological:  Negative for adenopathy. Does not bruise/bleed easily.  Psychiatric/Behavioral:  Negative for confusion.    MEDICAL HISTORY:  Past Medical History:  Diagnosis Date   AKI (acute kidney injury) ([place]) 01/06/2019   Anemia    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[place])    autologous stem cell bone marrow transplant on 04/23/2019.   Fatty liver    on u/s 07/2018   Hyperlipidemia 03/2004   Hypertension 1994   Multiple myeloma ([place])    Snores  SURGICAL HISTORY: Past Surgical History:  Procedure Laterality Date   CATARACT EXTRACTION W/PHACO Right 03/04/2018   Procedure: CATARACT EXTRACTION PHACO AND INTRAOCULAR LENS PLACEMENT (IOC)  RIGHT TORIC;  Surgeon: Brasington, Chadwick, MD;  Location: [facility];  Service: Ophthalmology;  Laterality:  Right;  [place] no Toric Lens 1:45 5.3   COLONOSCOPY WITH PROPOFOL N/A 07/20/2018   Procedure: COLONOSCOPY WITH PROPOFOL;  Surgeon: Anna, Kiran, MD;  Location: [facility];  Service: Gastroenterology;  Laterality: N/A;   ESOPHAGOGASTRODUODENOSCOPY (EGD) WITH PROPOFOL N/A 07/20/2018   Procedure: ESOPHAGOGASTRODUODENOSCOPY (EGD) WITH PROPOFOL;  Surgeon: Anna, Kiran, MD;  Location: [facility];  Service: Gastroenterology;  Laterality: N/A;   EYE SURGERY Right    HERNIA REPAIR     L Lap herniorraphy  04/2000   Left wrist ganglionectomy      SOCIAL HISTORY: Social History   Socioeconomic History   Marital status: Single    Spouse name: Not on file   Number of children: 1   Years of education: Not on file   Highest education level: Not on file  Occupational History   Occupation: [employer]  Tobacco Use   Smoking status: Never   Smokeless tobacco: Never   Tobacco comments:    Occassionally  Vaping Use   Vaping Use: Never used  Substance and Sexual Activity   Alcohol use: Not Currently    Alcohol/week: 3.0 standard drinks    Types: 3 Cans of beer per week   Drug use: No   Sexual activity: Not on file  Other Topics Concern   Not on file  Social History Narrative   Divorced 2009, married 05/27/21.     His daughter died 4 days after giving birth to the patient's granddaughter   Has joint custody of his deceased daughter's child   Worked at [employer] is [place], retired 2020.     Social Determinants of Health   Financial Resource Strain: Not on file  Food Insecurity: Not on file  Transportation Needs: Not on file  Physical Activity: Not on file  Stress: Not on file  Social Connections: Not on file  Intimate Partner Violence: Not on file    FAMILY HISTORY: Family History  Problem Relation Age of Onset   Hypertension Mother    Diabetes Mother    Heart disease Mother        CAD   Dementia Mother    Prostate cancer Father    Hypertension Father    Heart  disease Father        MI 02/03   Colon cancer Father    Hypertension Sister    Hypertension Sister    Hypertension Sister     ALLERGIES:  is allergic to bactrim [sulfamethoxazole-trimethoprim], clonidine derivatives, and tadalafil.  MEDICATIONS:  Current Outpatient Medications  Medication Sig Dispense Refill   acetaminophen (TYLENOL) 325 MG tablet Take 2 tablets (650 mg total) by mouth See admin instructions. 1 hour prior to chemotherapy treatments 30 tablet 0   amLODipine (NORVASC) 5 MG tablet TAKE 2 TABLETS BY MOUTH EVERY DAY 180 tablet 1   ASPIRIN 81 PO Take 81 mg by mouth daily.     B Complex Vitamins (VITAMIN B COMPLEX PO) SMARTSIG:By Mouth     chlorhexidine (PERIDEX) 0.12 % solution Use as directed 15 mLs in the mouth or throat 2 (two) times daily. 473 mL 1   co-enzyme Q-10 50 MG capsule Take 50 mg by mouth daily.     [employer] VITAMIN B12 1000 MCG tablet   TAKE 1 TABLET BY MOUTH EVERY DAY 90 tablet 3   cyclobenzaprine (FLEXERIL) 5 MG tablet Take 1 tablet (5 mg total) by mouth daily as needed for muscle spasms. 30 tablet 0   dexamethasone (DECADRON) 4 MG tablet Take 5 tablets (20 mg total) by mouth once a week. Take at least 1 hour prior to infusion appt. Take with food. To start on 06/07/21. 20 tablet 3   diphenhydrAMINE (BENADRYL) 50 MG tablet Take 1 tablet (50 mg total) by mouth See admin instructions. Take 1 tablet 1 hour prior to chemotherapy treatments. 30 tablet 0   hydrALAZINE (APRESOLINE) 10 MG tablet TAKE 1 TABLET (10 MG TOTAL) BY MOUTH 3 (THREE) TIMES DAILY. 270 tablet 1   lisinopril (ZESTRIL) 20 MG tablet Take 1 tablet (20 mg total) by mouth daily. Please keep appt on 07/05/21 90 tablet 0   loperamide (IMODIUM) 2 MG capsule Take 1 capsule (2 mg total) by mouth See admin instructions. Take 2 capsules at the onset of diarrhea, then 1 capsule every 2 hours or after every loose bowel movement. Maximum 8 capsules per 24 hours. 60 capsule 0   LORazepam (ATIVAN) 1 MG tablet TAKE 1 TABLET  (1 MG TOTAL) BY MOUTH EVERY 6 (SIX) HOURS AS NEEDED FOR ANXIETY (FOR NAUSEA)     magnesium chloride (SLOW-MAG) 64 MG TBEC SR tablet Take 1 tablet (64 mg total) by mouth daily. 60 tablet 0   metoprolol succinate (TOPROL-XL) 50 MG 24 hr tablet TAKE 4 TABLETS (200 MG TOTAL) BY MOUTH DAILY. TAKE WITH OR IMMEDIATELY FOLLOWING A MEAL. 360 tablet 2   montelukast (SINGULAIR) 10 MG tablet Take 1 tablet (10 mg total) by mouth See admin instructions. Take 1day prior to chemotherapy 30 tablet 1   ondansetron (ZOFRAN) 8 MG tablet Take 1 tablet (8 mg total) by mouth every 8 (eight) hours as needed. for nausea 30 tablet 1   pomalidomide (POMALYST) 2 MG capsule Take 1 capsule (2 mg total) by mouth daily. Take for 21 days on, then hold for 7 days. Repeat every 28 days. 21 capsule 0   pravastatin (PRAVACHOL) 10 MG tablet Take 1 tablet (10 mg total) by mouth daily. 90 tablet 3   prochlorperazine (COMPAZINE) 10 MG tablet      sildenafil (REVATIO) 20 MG tablet Take 1-5 tablets (20-100 mg total) by mouth daily as needed. 50 tablet 12   zinc gluconate 50 MG tablet Take 50 mg by mouth daily.     No current facility-administered medications for this visit.   Facility-Administered Medications Ordered in Other Visits  Medication Dose Route Frequency Provider Last Rate Last Admin   daratumumab-hyaluronidase-fihj (DARZALEX FASPRO) 1800-30000 MG-UT/15ML chemo SQ injection 1,800 mg  1,800 mg Subcutaneous Once Earlie Server, MD         PHYSICAL EXAMINATION: ECOG PERFORMANCE STATUS: 1 - Symptomatic but completely ambulatory Vitals:   08/16/21 0829  BP: 134/88  Pulse: 66  Temp: 99.3 F (37.4 C)   Filed Weights   08/16/21 0829  Weight: 206 lb 3.2 oz (93.5 kg)    Physical Exam Constitutional:      General: He is not in acute distress. HENT:     Head: Normocephalic and atraumatic.  Eyes:     General: No scleral icterus. Cardiovascular:     Rate and Rhythm: Normal rate and regular rhythm.     Heart sounds: Normal  heart sounds.  Pulmonary:     Effort: Pulmonary effort is normal. No respiratory distress.  Breath sounds: No wheezing.  Abdominal:     General: Bowel sounds are normal. There is no distension.     Palpations: Abdomen is soft.  Musculoskeletal:        General: No deformity. Normal range of motion.     Cervical back: Normal range of motion and neck supple.  Skin:    General: Skin is warm and dry.     Findings: No erythema or rash.  Neurological:     Mental Status: He is alert and oriented to person, place, and time. Mental status is at baseline.     Cranial Nerves: No cranial nerve deficit.     Coordination: Coordination normal.  Psychiatric:        Mood and Affect: Mood normal.     LABORATORY DATA:  I have reviewed the data as listed Lab Results  Component Value Date   WBC 4.6 08/16/2021   HGB 12.6 (L) 08/16/2021   HCT 37.3 (L) 08/16/2021   MCV 100.5 (H) 08/16/2021   PLT 70 (L) 08/16/2021   Recent Labs    07/26/21 0758 08/02/21 0824 08/16/21 0811  NA 137 139 137  K 4.1 4.6 4.6  CL 105 101 101  CO2 _0 GLUCOSE 103* 95 102*  BUN _1 CREATININE 1.29* 1.39* 1.25*  CALCIUM 8.5* 10.0 9.6  GFRNONAA >60 56* >60  PROT 6.4* 6.9 6.8  ALBUMIN 3.9 4.2 4.5  AST _2 ALT _3 ALKPHOS 62 66 69  BILITOT 0.3 0.9 1.0    Iron/TIBC/Ferritin/ %Sat    Component Value Date/Time   IRON 74 07/01/2018 0944   TIBC 250 07/01/2018 0944   FERRITIN 357 07/01/2018 0944   IRONPCTSAT 30 07/01/2018 0944    Lab Results  Component Value Date   TOTALPROTELP 6.7 06/26/2021   ALBUMINELP 4.0 03/08/2021   A1GS 0.2 03/08/2021   A2GS 0.7 03/08/2021   BETS 1.1 03/08/2021   GAMS 1.1 03/08/2021   MSPIKE Not Observed 03/08/2021   SPEI Comment 03/08/2021   Lab Results  Component Value Date   KPAFRELGTCHN 28.5 (H) 06/26/2021   LAMBDASER 10.0 06/26/2021   KAPLAMBRATIO 2.85 (H) 06/26/2021    RADIOGRAPHIC STUDIES: I have personally reviewed the radiological images  as listed and agreed with the findings in the report. DG Cervical Spine Complete  Result Date: 07/21/2021 CLINICAL DATA:  Multiple myeloma Neck pain for few weeks EXAM: CERVICAL SPINE - COMPLETE 4+ VIEW COMPARISON:  08/10/2019 FINDINGS: There is reversal of normal cervical lordosis with apex at C5. There is minimal retrolisthesis C5 on C6. No significant vertebral body height loss. Moderate disc height loss and endplate degenerative changes seen at C5-C6. Mild disc height loss and endplate spurring seen at C6-C7. Severe right neural foraminal stenosis at C3-C4. Mild right neural foraminal stenosis at C5-C6 and C6-C7. Moderate left neural foraminal stenosis at C4-C5 and C5-C6. Mild left neural foraminal stenosis at C3-C4 and C6-C7. is Facet degenerative changes seen throughout the cervical spine. Minimal atheromatous plaque noted in the region the carotid bulbs. IMPRESSION: Multilevel degenerative changes of the cervical spine as above. Electronically Signed   By: Miachel Roux M.D.   On: 07/21/2021 09:13   MR Lumbar Spine W Wo Contrast  Result Date: 06/19/2021 CLINICAL DATA:  Back pain, multiple myeloma EXAM: MRI LUMBAR SPINE WITHOUT AND WITH CONTRAST TECHNIQUE: Multiplanar and multiecho pulse sequences of the lumbar spine were obtained without and with intravenous contrast. CONTRAST:  72m GADAVIST GADOBUTROL  1 MMOL/ML IV SOLN COMPARISON:  None. FINDINGS: Segmentation:  Standard. Alignment:  No significant listhesis. Vertebrae: Vertebral body heights are maintained. No substantial marrow edema. No suspicious osseous lesion. Conus medullaris and cauda equina: Conus extends to the T12-L1 level. Conus and cauda equina appear normal. No abnormal intrathecal enhancement. Paraspinal and other soft tissues: Probable bilateral renal cysts partially imaged. Disc levels: L1-L2:  No canal or foraminal stenosis. L2-L3:  No canal or foraminal stenosis. L3-L4:  Disc bulge.  No canal or foraminal stenosis. L4-L5: Disc bulge.  Central and left subarticular annular fissure. No canal or foraminal stenosis. L5-S1: Disc bulge with endplate osteophytic ridging. Facet arthropathy. No canal stenosis. Moderate right and mild left foraminal stenosis. Potential exiting right L5 nerve root compression. IMPRESSION: No evidence of myelomatous involvement. No compression fracture. Mild lower lumbar degenerative changes. There is right foraminal narrowing at L5-S1 with potential exiting nerve root compression. Electronically Signed   By: Macy Mis M.D.   On: 06/19/2021 12:20   DG Foot Complete Right  Result Date: 08/07/2021 Please see detailed radiograph report in office note.     ASSESSMENT & PLAN:  1. Multiple myeloma in relapse ([place])   2. Encounter for antineoplastic chemotherapy   3. Thrombocytopenia ([place])   4. Chemotherapy induced neutropenia ([facility])    #Light chain multiple myeloma beta 2 microglobulin 5 and normal albumin.  Stage II.cytogenetics showed normal male chromosome, MDS FISH panel showed trisomy 87- Standard Risk.S/p RVD x 9 and  Autologous bone marrow stem cell transplant at [facility] on 04/23/2019.' Early relapse multiple myeloma. Labs reviewed and discussed with patient To proceed with cycle 4 day 1 daratumumab/dexamethasone 20 mg/Pomalyst 3 weeks on 1 week off. Recommend patient to take dexamethasone 20 mg weekly.  # Chemotherapy induced neutropenia, ANC 2.2 #Chronic thrombocytopenia, platelet count is 122,000.  Stable  # CKD, avoid nephrotoxin.  Creatinine fluctuates.  Encourage patient to increase oral hydration.   #Neck pain, previous Jan 2022 MRI cervical spine showed DJD.  Check X ray of cervical spine- DJD  Refer to physical therapy.  Flexeril 67m daily PRN  Post bone marrow transplant care He has completed 1 year of acyclovir.  Off acyclovir now. [facility]   #Right foot pain, possible extensor tendinitis.  Recommend patient to apply cold and warm compress.   Dexamethasone may help his symptoms.  He plans to get an appointment with podiatry.  #Bone health/osteopenia/multiple myeloma-  Continue Xgeva monthly.-Xgeva today. Continue calcium and vitamin D supplementation. Follow up 2 weeks for Lab MD Daratumumab treatment.   ZEarlie Server MD, PhD Hematology Oncology [facility]11/12/2020

## 2021-08-16 NOTE — Progress Notes (Signed)
Pt received subq dara in clinic today. Tolerated well. Observed for 30 min post injection. No complaints @ d/c.

## 2021-08-16 NOTE — Telephone Encounter (Signed)
Please put the note in her chart and get her scheduled when possible.  Thanks.

## 2021-08-16 NOTE — Progress Notes (Signed)
Hematology/Oncology  Follow up note [facility] [phone] Fax:([phone]) [phone]   Patient Care Team: Tonia Ghent, MD as PCP - General (Family Medicine) Earlie Server, MD as Consulting Physician (Oncology) Beather Arbour Lilyan Punt, MD as Referring Physician (Hematology and Oncology) Diannia Ruder, MD as Referring Physician (Hematology and Oncology)  REASON FOR VISIT Follow up for multiple myeloma.   HISTORY OF PRESENTING ILLNESS:  Luis Burke is a  65 y.o.  male with PMH listed below presents for follow up of multiple myeloma.  # 07/27/2018 multiple myeloma panel showed M protein of 0.1, IgG 662, IgA 49, IgM 7. Patient was called back to further labs done. 08/10/2018, free light chain ratio showed extremely high level of kappa free light chain 10,183, with a kappa lambda light chain ratio of 1414.31 LDH 164 Beta-2 microglobulin 5 Patient was called and discuss about results.  He was recommended to undergo bone marrow biopsy and PET scan. 08/19/2018 bone marrow biopsy showed hypercellular marrow 80%, involved by plasma cell neoplasm up to 95%.  Consistent with plasma cell myeloma. Myeloma FISH  gain of gain of CEP12  09/10/2018 skeletal survey showed questionable small lucent lesions within the midshaft of the humerus bilaterally.  Otherwise no suspicious focal lytic lesion or acute bone abnormality. 08/25/2018 PET scan showed no definite hypermetabolic bone disease but CT findings are highly suspicious for numerous small myelomatous lesions involving the spine, sternum and scattered ribs.  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 He received preparative regimen with melphalan 200 mg/m on 04/22/2019 followed by autologous stem cell infusion on 04/23/2019. Currently he is transfusion independent.  Transfusion criteria with as needed hemoglobin less than 7.5 for platelet less than 10,000.  Transplant course was complicated with  febrile neutropenia grade 3, treated with vancomycin and cephapirin until engraftment on 05/06/2019. Patient also had engraftment syndrome and had to be started on Solu-Medrol 25 mg twice daily on 05/05/2019.  Transition to Medrol Dosepak on 05/07/2019 to complete steroid taper as outpatient.  Patient is currently on acyclovir for 1 year after transplant, dose was reduced on 722 11/20/2018 due to renal function. Patient had a baseline CKD with creatinine running between 1.4-1.6.  He had acute on chronic kidney injury and creatinine went up to 2.2 on 722.  Fluid hydration was given and creatinine decreased to baseline 1.5-1.7. His Hickman catheter was removed.  Candida Cruris treated with topical clotrimazole 1% 3 times daily and to resolution..  #  seen by Dr. Tonia Brooms BMT team on 06/30/2019.revaccination at [facility] begins at 12 months post transplant I discussed with [facility] hematology Dr.Choi and he recommends plans as listed below.  -patient to be started on Ixazomib maintenance: -Ixazomib 77m on D1/D8/D15 out of 28 day cycle.  -patient will start re-vaccination 12 months post transplant.  -Post transplant bone marrow biopsy if clinically indicated.  # seen by [facility] bone marrow transplant team in June 2021.  Patient has been started on immunization protocol.  He reports no new complaints.  #[facility]  Lucent lesion in the L4 spinous process measuring 4 mm which is  nonspecific. Consider confirmation of marrow replacing process with MRI.  2.  Incompletely characterized renal cysts   # He continues to have chronic back pain and neck pain.  [facility] MRI lumbar spine with and without contrast showed no evidence of spinal metastasis.  Lumbar spondylosis is most pronounced  at L5-S1 where there is severe right and moderate left neural foraminal stenosis. 08/11/2020, x-ray cervical spine complete with flexion and extension showed The cervical spine  is visualized from C1 to the top of T1 on the lateral  view.  Reversal of the normal cervical lordosis with a focal kyphosis centered at the C5-C6 intervertebral level. Trace anterolisthesis of C4 onto C5. Trace retrolisthesis of C6 on C7. No evidence of dynamic instability is noted on  the flexion or extension views.   No prevertebral soft tissue swelling. Cervical vertebral body heights are maintained. Multilevel degenerative changes of the cervical spine including discogenic disease, most pronounced at C5-C6 and C6-C7, and facet arthropathy most pronounced at C4-C5. Multilevel mild to moderate left neural foraminal osseous narrowing of the mid to lower cervical vertebrae, possibly centrally/artifactual by patient positioning.  #07/28/2020 bone marrow normocellular marrow with polytypic plasmacytosis, 3 to 8%   Patient is in remission.  Cytogenetics negative for trisomy 12.  Negative myeloma FISH panel.   # Lumbar lesion,  11/13/20 [facility]  MRI cervical sine w/wo contrast - no evidence of metastatic disease C3-4 severe spinal stenosis, left neural foraminal narrowing. C4-5 spinal canal stenosis. [facility] MRI results showed no metastatic spine lesion. Chronic back pain and neck pain, images are consistent with chronic degenerative disease.  Patient continues to follow-up with [facility] orthopedic surgeon.  04/03/2021, light chain ratio increased to 3.76.   05/15/2021 bone marrow biopsy showed slightly hypercellular bone marrow for age with slight plasmacytosis.  5% plasma cells with kappa light chain restriction.  Cytogenetics normal.  Myeloma FISH negative # 05/15/2021 bone marrow biopsy showed slightly hypercellular bone marrow for age with slight plasmacytosis.  5% plasma cells with kappa light chain restriction.  Cytogenetics normal.  Myeloma FISH negative  # 05/31/2021 Daratumumab -Pomalyst-dexamethasone-D1 patient had grade 2 infusion reaction to daratumumab and received steroids, Benadryl, famotidine.  He was  able to finish treatment.D8 was able to finish Daratumumab with no infusion reactions.  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   INTERVAL HISTORY Luis Burke is a 65 y.o. male who has above history reviewed by me today for follow-up  Currently on Daratumumab Pomalyst dexamethasone He had right foot fracture. No trauma prior except him trying to life a tree a while back.  He was seen by podiatry and wears right foot brace.  No bleeding events. No new complaints.     Review of Systems  Constitutional:  Negative for appetite change, chills, fatigue, fever and unexpected weight change.  HENT:   Negative for hearing loss and voice change.   Eyes:  Negative for eye problems and icterus.  Respiratory:  Negative for chest tightness, cough and shortness of breath.   Cardiovascular:  Negative for chest pain and leg swelling.  Gastrointestinal:  Negative for abdominal distention and abdominal pain.  Endocrine: Negative for hot flashes.  Genitourinary:  Negative for difficulty urinating, dysuria and frequency.   Musculoskeletal:  Positive for back pain and neck pain. Negative for arthralgias.  Skin:  Negative for itching and rash.  Neurological:  Negative for light-headedness and numbness.  Hematological:  Negative for adenopathy. Does not bruise/bleed easily.  Psychiatric/Behavioral:  Negative for confusion.    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   Fatty liver    on u/s 07/2018  Hyperlipidemia 03/2004   Hypertension 1994   Multiple myeloma ([place])    Snores     SURGICAL HISTORY: Past Surgical History:  Procedure Laterality Date   CATARACT EXTRACTION W/PHACO Right 03/04/2018   Procedure: CATARACT EXTRACTION PHACO AND INTRAOCULAR LENS PLACEMENT ([place])  RIGHT TORIC;  Surgeon: Leandrew Koyanagi, MD;  Location:  [place];  Service: Ophthalmology;  Laterality: Right;  [place] no Toric Lens 1:45 5.3   COLONOSCOPY WITH PROPOFOL N/A 07/20/2018   Procedure: COLONOSCOPY WITH PROPOFOL;  Surgeon: Jonathon Bellows, MD;  Location: [facility] ENDOSCOPY;  Service: Gastroenterology;  Laterality: N/A;   ESOPHAGOGASTRODUODENOSCOPY (EGD) WITH PROPOFOL N/A 07/20/2018   Procedure: ESOPHAGOGASTRODUODENOSCOPY (EGD) WITH PROPOFOL;  Surgeon: Jonathon Bellows, MD;  Location[facility] ENDOSCOPY;  Service: Gastroenterology;  Laterality: N/A;   EYE SURGERY Right    HERNIA REPAIR     L Lap herniorraphy  04/2000   Left wrist ganglionectomy      SOCIAL HISTORY: Social History   Socioeconomic History   Marital status: Single    Spouse name: Not on file   Number of children: 1   Years of education: Not on file   Highest education level: Not on file  Occupational History   Occupation: [employer]  Tobacco Use   Smoking status: Never   Smokeless tobacco: Never   Tobacco comments:    Occassionally  Vaping Use   Vaping Use: Never used  Substance and Sexual Activity   Alcohol use: Not Currently    Alcohol/week: 3.0 standard drinks    Types: 3 Cans of beer per week   Drug use: No   Sexual activity: Not on file  Other Topics Concern   Not on file  Social History Narrative   Divorced 2009, married 2021-06-21.     His daughter died 4 days after giving birth to the patient's granddaughter   Has joint custody of his deceased daughter's child   Worked at [employer] is [place], retired 2020.     Social Determinants of Health   Financial Resource Strain: Not on file  Food Insecurity: Not on file  Transportation Needs: Not on file  Physical Activity: Not on file  Stress: Not on file  Social Connections: Not on file  Intimate Partner Violence: Not on file    FAMILY HISTORY: Family History  Problem Relation Age of Onset   Hypertension Mother    Diabetes Mother    Heart disease Mother        CAD   Dementia Mother     Prostate cancer Father    Hypertension Father    Heart disease Father        MI 02/03   Colon cancer Father    Hypertension Sister    Hypertension Sister    Hypertension Sister     ALLERGIES:  is allergic to bactrim [sulfamethoxazole-trimethoprim], clonidine derivatives, and tadalafil.  MEDICATIONS:  Current Outpatient Medications  Medication Sig Dispense Refill   acetaminophen (TYLENOL) 325 MG tablet Take 2 tablets (650 mg total) by mouth See admin instructions. 1 hour prior to chemotherapy treatments 30 tablet 0   amLODipine (NORVASC) 5 MG tablet TAKE 2 TABLETS BY MOUTH EVERY DAY 180 tablet 1   ASPIRIN 81 PO Take 81 mg by mouth daily.     B Complex Vitamins (VITAMIN B COMPLEX PO) SMARTSIG:By Mouth     chlorhexidine (PERIDEX) 0.12 % solution Use as directed 15 mLs in the mouth or throat 2 (two) times daily. 473 mL 1   co-enzyme Q-10  50 MG capsule Take 50 mg by mouth daily.     [employer] VITAMIN B12 1000 MCG tablet TAKE 1 TABLET BY MOUTH EVERY DAY 90 tablet 3   cyclobenzaprine (FLEXERIL) 5 MG tablet Take 1 tablet (5 mg total) by mouth daily as needed for muscle spasms. 30 tablet 0   dexamethasone (DECADRON) 4 MG tablet Take 5 tablets (20 mg total) by mouth once a week. Take at least 1 hour prior to infusion appt. Take with food. To start on 06/07/21. 20 tablet 3   diphenhydrAMINE (BENADRYL) 50 MG tablet Take 1 tablet (50 mg total) by mouth See admin instructions. Take 1 tablet 1 hour prior to chemotherapy treatments. 30 tablet 0   hydrALAZINE (APRESOLINE) 10 MG tablet TAKE 1 TABLET (10 MG TOTAL) BY MOUTH 3 (THREE) TIMES DAILY. 270 tablet 1   lisinopril (ZESTRIL) 20 MG tablet Take 1 tablet (20 mg total) by mouth daily. Please keep appt on 07/05/21 90 tablet 0   loperamide (IMODIUM) 2 MG capsule Take 1 capsule (2 mg total) by mouth See admin instructions. Take 2 capsules at the onset of diarrhea, then 1 capsule every 2 hours or after every loose bowel movement. Maximum 8 capsules per 24 hours. 60  capsule 0   LORazepam (ATIVAN) 1 MG tablet TAKE 1 TABLET (1 MG TOTAL) BY MOUTH EVERY 6 (SIX) HOURS AS NEEDED FOR ANXIETY (FOR NAUSEA)     magnesium chloride (SLOW-MAG) 64 MG TBEC SR tablet Take 1 tablet (64 mg total) by mouth daily. 60 tablet 0   metoprolol succinate (TOPROL-XL) 50 MG 24 hr tablet TAKE 4 TABLETS (200 MG TOTAL) BY MOUTH DAILY. TAKE WITH OR IMMEDIATELY FOLLOWING A MEAL. 360 tablet 2   montelukast (SINGULAIR) 10 MG tablet Take 1 tablet (10 mg total) by mouth See admin instructions. Take 1day prior to chemotherapy 30 tablet 1   ondansetron (ZOFRAN) 8 MG tablet Take 1 tablet (8 mg total) by mouth every 8 (eight) hours as needed. for nausea 30 tablet 1   pomalidomide (POMALYST) 2 MG capsule Take 1 capsule (2 mg total) by mouth daily. Take for 21 days on, then hold for 7 days. Repeat every 28 days. 21 capsule 0   pravastatin (PRAVACHOL) 10 MG tablet Take 1 tablet (10 mg total) by mouth daily. 90 tablet 3   prochlorperazine (COMPAZINE) 10 MG tablet      sildenafil (REVATIO) 20 MG tablet Take 1-5 tablets (20-100 mg total) by mouth daily as needed. 50 tablet 12   zinc gluconate 50 MG tablet Take 50 mg by mouth daily.     No current facility-administered medications for this visit.     PHYSICAL EXAMINATION: ECOG PERFORMANCE STATUS: 1 - Symptomatic but completely ambulatory Vitals:   08/16/21 0829  BP: 134/88  Pulse: 66  Temp: 99.3 F (37.4 C)   Filed Weights   08/16/21 0829  Weight: 206 lb 3.2 oz (93.5 kg)    Physical Exam Constitutional:      General: He is not in acute distress. HENT:     Head: Normocephalic and atraumatic.  Eyes:     General: No scleral icterus. Cardiovascular:     Rate and Rhythm: Normal rate and regular rhythm.     Heart sounds: Normal heart sounds.  Pulmonary:     Effort: Pulmonary effort is normal. No respiratory distress.     Breath sounds: No wheezing.  Abdominal:     General: Bowel sounds are normal. There is no distension.     Palpations:  Abdomen is soft.  Musculoskeletal:        General: No deformity. Normal range of motion.     Cervical back: Normal range of motion and neck supple.  Skin:    General: Skin is warm and dry.     Findings: No erythema or rash.  Neurological:     Mental Status: He is alert and oriented to person, place, and time. Mental status is at baseline.     Cranial Nerves: No cranial nerve deficit.     Coordination: Coordination normal.  Psychiatric:        Mood and Affect: Mood normal.     LABORATORY DATA:  I have reviewed the data as listed Lab Results  Component Value Date   WBC 4.6 08/16/2021   HGB 12.6 (L) 08/16/2021   HCT 37.3 (L) 08/16/2021   MCV 100.5 (H) 08/16/2021   PLT 70 (L) 08/16/2021   Recent Labs    07/26/21 0758 08/02/21 0824 08/16/21 0811  NA 137 139 137  K 4.1 4.6 4.6  CL 105 101 101  CO2 $Re'25 30 27  'tPm$ GLUCOSE 103* 95 102*  BUN $Re'18 20 23  'Lsu$ CREATININE 1.29* 1.39* 1.25*  CALCIUM 8.5* 10.0 9.6  GFRNONAA >60 56* >60  PROT 6.4* 6.9 6.8  ALBUMIN 3.9 4.2 4.5  AST $Re'16 18 15  'gnR$ ALT $R'19 19 30  'Ew$ ALKPHOS 62 66 69  BILITOT 0.3 0.9 1.0    Iron/TIBC/Ferritin/ %Sat    Component Value Date/Time   IRON 74 07/01/2018 0944   TIBC 250 07/01/2018 0944   FERRITIN 357 07/01/2018 0944   IRONPCTSAT 30 07/01/2018 0944    Lab Results  Component Value Date   TOTALPROTELP 6.7 06/26/2021   ALBUMINELP 4.0 03/08/2021   A1GS 0.2 03/08/2021   A2GS 0.7 03/08/2021   BETS 1.1 03/08/2021   GAMS 1.1 03/08/2021   MSPIKE Not Observed 03/08/2021   SPEI Comment 03/08/2021   Lab Results  Component Value Date   KPAFRELGTCHN 28.5 (H) 06/26/2021   LAMBDASER 10.0 06/26/2021   KAPLAMBRATIO 2.85 (H) 06/26/2021    RADIOGRAPHIC STUDIES: I have personally reviewed the radiological images as listed and agreed with the findings in the report. DG Cervical Spine Complete  Result Date: 07/21/2021 CLINICAL DATA:  Multiple myeloma Neck pain for few weeks EXAM: CERVICAL SPINE - COMPLETE 4+ VIEW COMPARISON:   08/10/2019 FINDINGS: There is reversal of normal cervical lordosis with apex at C5. There is minimal retrolisthesis C5 on C6. No significant vertebral body height loss. Moderate disc height loss and endplate degenerative changes seen at C5-C6. Mild disc height loss and endplate spurring seen at C6-C7. Severe right neural foraminal stenosis at C3-C4. Mild right neural foraminal stenosis at C5-C6 and C6-C7. Moderate left neural foraminal stenosis at C4-C5 and C5-C6. Mild left neural foraminal stenosis at C3-C4 and C6-C7. is Facet degenerative changes seen throughout the cervical spine. Minimal atheromatous plaque noted in the region the carotid bulbs. IMPRESSION: Multilevel degenerative changes of the cervical spine as above. Electronically Signed   By: Miachel Roux M.D.   On: 07/21/2021 09:13   MR Lumbar Spine W Wo Contrast  Result Date: 06/19/2021 CLINICAL DATA:  Back pain, multiple myeloma EXAM: MRI LUMBAR SPINE WITHOUT AND WITH CONTRAST TECHNIQUE: Multiplanar and multiecho pulse sequences of the lumbar spine were obtained without and with intravenous contrast. CONTRAST:  33mL GADAVIST GADOBUTROL 1 MMOL/ML IV SOLN COMPARISON:  None. FINDINGS: Segmentation:  Standard. Alignment:  No significant listhesis. Vertebrae: Vertebral body heights are maintained. No substantial  marrow edema. No suspicious osseous lesion. Conus medullaris and cauda equina: Conus extends to the T12-L1 level. Conus and cauda equina appear normal. No abnormal intrathecal enhancement. Paraspinal and other soft tissues: Probable bilateral renal cysts partially imaged. Disc levels: L1-L2:  No canal or foraminal stenosis. L2-L3:  No canal or foraminal stenosis. L3-L4:  Disc bulge.  No canal or foraminal stenosis. L4-L5: Disc bulge. Central and left subarticular annular fissure. No canal or foraminal stenosis. L5-S1: Disc bulge with endplate osteophytic ridging. Facet arthropathy. No canal stenosis. Moderate right and mild left foraminal stenosis.  Potential exiting right L5 nerve root compression. IMPRESSION: No evidence of myelomatous involvement. No compression fracture. Mild lower lumbar degenerative changes. There is right foraminal narrowing at L5-S1 with potential exiting nerve root compression. Electronically Signed   By: Macy Mis M.D.   On: 06/19/2021 12:20   DG Foot Complete Right  Result Date: 08/07/2021 Please see detailed radiograph report in office note.     ASSESSMENT & PLAN:  1. Multiple myeloma in relapse ([place])   2. Encounter for antineoplastic chemotherapy   3. Thrombocytopenia ([place])   4. Chemotherapy induced neutropenia (HCC)    #Light chain multiple myeloma beta 2 microglobulin 5 and normal albumin.  Stage II.cytogenetics showed normal male chromosome, MDS FISH panel showed trisomy 45- Standard Risk.S/p RVD x 9 and  Autologous bone marrow stem cell transplant at [facility] on 04/23/2019.' Early relapse multiple myeloma. Labs are reviewed and discussed with patient. To proceed with cycle 4 day 15 daratumumab/dexamethasone 20 mg/Pomalyst 3 weeks on 1 week off. He takes dexamethasone 20 mg weekly. He takes home supply of singulair prior to his daratumumab injection.  Check skeletal survey  # Chemotherapy induced neutropenia, ANC 2.0 #Chronic thrombocytopenia, platelet count is 70,000.  Decreased. Ok to proceed with rest of pomalyst for this cycle # CKD, avoid nephrotoxin.  Creatinine fluctuates.  Encourage patient to increase oral hydration.   #Neck pain, previous Jan 2022 MRI cervical spine showed DJD.  Check X ray of cervical spine- DJD  Refer to physical therapy.  Flexeril $RemoveB'5mg'SKcsueGc$  daily PRN  Post bone marrow transplant care He has completed 1 year of acyclovir.  Off acyclovir now. Post transplant vaccination finished at [facility].   #Right foot pain, possible extensor tendinitis.  Recommend patient to apply cold and warm compress.  Dexamethasone may help his symptoms.  He plans to get an appointment with  podiatry.  #Bone health/osteopenia/multiple myeloma-  Continue Xgeva monthly.-Xgeva today. Continue calcium and vitamin D supplementation. Follow up 2 weeks for Lab MD Daratumumab treatment.   Earlie Server, MD, PhD Hematology Oncology [place] at [facility] 08/16/2021

## 2021-08-16 NOTE — Telephone Encounter (Signed)
Pt wife called stating that Damita Dunnings told her husband to call and see if she can be taken on as a new pt with either him or another provider

## 2021-08-16 NOTE — Patient Instructions (Addendum)
[facility]   Discharge Instructions: Thank you for choosing [facility] to provide your oncology and hematology care.  If you have a lab appointment with the [facility], please go directly to the [facility] and check in at the registration area.  Wear comfortable clothing and clothing appropriate for easy access to any Portacath or PICC line.   We strive to give you quality time with your provider. You may need to reschedule your appointment if you arrive late (15 or more minutes).  Arriving late affects you and other patients whose appointments are after yours.  Also, if you miss three or more appointments without notifying the office, you may be dismissed from the clinic at the provider's discretion.      For prescription refill requests, have your pharmacy contact our office and allow 72 hours for refills to be completed.    Today you received the following chemotherapy and/or immunotherapy agents - Daratumumab      To help prevent nausea and vomiting after your treatment, we encourage you to take your nausea medication as directed.  BELOW ARE SYMPTOMS THAT SHOULD BE REPORTED IMMEDIATELY: *FEVER GREATER THAN 100.4 F (38 C) OR HIGHER *CHILLS OR SWEATING *NAUSEA AND VOMITING THAT IS NOT CONTROLLED WITH YOUR NAUSEA MEDICATION *UNUSUAL SHORTNESS OF BREATH *UNUSUAL BRUISING OR BLEEDING *URINARY PROBLEMS (pain or burning when urinating, or frequent urination) *BOWEL PROBLEMS (unusual diarrhea, constipation, pain near the anus) TENDERNESS IN MOUTH AND THROAT WITH OR WITHOUT PRESENCE OF ULCERS (sore throat, sores in mouth, or a toothache) UNUSUAL RASH, SWELLING OR PAIN  UNUSUAL VAGINAL DISCHARGE OR ITCHING   Items with * indicate a potential emergency and should be followed up as soon as possible or go to the Emergency Department if any problems should occur.  Please show the CHEMOTHERAPY ALERT CARD or IMMUNOTHERAPY ALERT CARD at  check-in to the Emergency Department and triage nurse.  Should you have questions after your visit or need to cancel or reschedule your appointment, please contact [facility]  [phone] and follow the prompts.  Office hours are 8:00 a.m. to 4:30 p.m. Monday - Friday. Please note that voicemails left after 4:00 p.m. may not be returned until the following business day.  We are closed weekends and major holidays. You have access to a nurse at all times for urgent questions. Please call the main number to the clinic [phone] and follow the prompts.  For any non-urgent questions, you may also contact your provider using MyChart. We now offer e-Visits for anyone 15 and older to request care online for non-urgent symptoms. For details visit [email or web address].   Also download the MyChart app! Go to the app store, search "MyChart", open the app, select [facility], and log in with your MyChart username and password.  Due to Covid, a mask is required upon entering the hospital/clinic. If you do not have a mask, one will be given to you upon arrival. For doctor visits, patients may have 1 support person aged 32 or older with them. For treatment visits, patients cannot have anyone with them due to current Covid guidelines and our immunocompromised population.   Acetaminophen Capsules or Tablets What is this medication? ACETAMINOPHEN (a set a MEE noe fen) treats mild to moderate pain. It may also be used to reduce fever. This medicine may be used for other purposes; ask your health care provider or pharmacist if you have questions. COMMON BRAND NAME(S): Aceta, Actamin, Anacin  Aspirin Free, Genapap, Genebs, Mapap, Pain & Fever, Pain and Fever, PAIN RELIEF, PAIN RELIEF Extra Strength, Pain Reliever, Panadol, PHARBETOL, Plus PHARMA, Q-Pap, Q-Pap Extra Strength, Tylenol, Tylenol CrushableTablet, Tylenol Extra Strength, Tylenol Regular Strength, XS No Aspirin, XS Pain  Reliever What should I tell my care team before I take this medication? They need to know if you have any of these conditions: If you often drink alcohol Liver disease An unusual or allergic reaction to acetaminophen, other medications, foods, dyes, or preservatives Pregnant or trying to get pregnant Breast-feeding How should I use this medication? Take this medication by mouth with a glass of water. Follow the directions on the package or prescription label. Take your medication at regular intervals. Do not take your medication more often than directed. Talk to your care team about the use of this medication in children. While this medication may be prescribed for children as young as 51 years of age for selected conditions, precautions do apply. Overdosage: If you think you have taken too much of this medicine contact a poison control center or emergency room at once. NOTE: This medicine is only for you. Do not share this medicine with others. What if I miss a dose? If you miss a dose, take it as soon as you can. If it is almost time for your next dose, take only that dose. Do not take double or extra doses. What may interact with this medication? Alcohol Imatinib Isoniazid Other medications with acetaminophen This list may not describe all possible interactions. Give your health care provider a list of all the medicines, herbs, non-prescription drugs, or dietary supplements you use. Also tell them if you smoke, drink alcohol, or use illegal drugs. Some items may interact with your medicine. What should I watch for while using this medication? Tell your care team if the pain lasts more than 10 days (5 days for children), if it gets worse, or if there is a new or different kind of pain. Also, check with your care team if a fever lasts for more than 3 days. Do not take other medications that contain acetaminophen with this one. Always read labels carefully. If you have questions, ask your care  team. If you take too much acetaminophen, get medical help right away. Too much acetaminophen can be very dangerous and cause liver damage. Even if you do not have symptoms, it is important to get help right away. What side effects may I notice from receiving this medication? Side effects that you should report to your care team as soon as possible: Allergic reactions-skin rash, itching, hives, swelling of the face, lips, tongue, or throat Liver injury-right upper belly pain, loss of appetite, nausea, light-colored stool, dark yellow or brown urine, yellowing skin or eyes, unusual weakness or fatigue Redness, blistering, peeling, or loosening of the skin, including inside the mouth Side effects that usually do not require medical attention (report to your care team if they continue or are bothersome): Headache Nausea Trouble sleeping Upset stomach This list may not describe all possible side effects. Call your doctor for medical advice about side effects. You may report side effects to FDA at 1-800-FDA-1088. Where should I keep my medication? Keep out of reach of children and pets. Store at room temperature between 20 and 25 degrees C (68 and 77 degrees F). Protect from moisture and heat. Throw away any unused medication after the expiration date. NOTE: This sheet is a summary. It may not cover all possible information. If you  have questions about this medicine, talk to your doctor, pharmacist, or health care provider.  2022 Elsevier/Gold Standard (2020-08-15 11:07:55)  Diphenhydramine Capsules or Tablets What is this medication? DIPHENHYDRAMINE (dye fen HYE dra meen) treats the symptoms of allergies and allergic reactions. It may also be used to prevent and treat motion sickness or symptoms of Parkinson disease. It works by blocking histamine, a substance released by the body during an allergic reaction. It belongs to a group of medications called antihistamines. This medicine may be used for  other purposes; ask your health care provider or pharmacist if you have questions. COMMON BRAND NAME(S): Alka-Seltzer Plus Allergy, Aller-G-Time, Banophen, Benadryl Allergy, Benadryl Allergy Dye Free, Benadryl Allergy Kapgel, Benadryl Allergy Ultratab, Diphedryl, Diphenhist, Genahist, Geri-Dryl, PHARBEDRYL, Q-Dryl, Gretta Began, Valu-Dryl, Vicks ZzzQuil Nightime Sleep-Aid What should I tell my care team before I take this medication? They need to know if you have any of these conditions: Asthma or lung disease Glaucoma High blood pressure or heart disease Liver disease Pain or difficulty passing urine Prostate trouble Ulcers or other stomach problems An unusual or allergic reaction to diphenhydramine, other medications foods, dyes, or preservatives such as sulfites Pregnant or trying to get pregnant Breast-feeding How should I use this medication? Take this medication by mouth with a full glass of water. Follow the directions on the prescription label. Take your doses at regular intervals. Do not take your medication more often than directed. To prevent motion sickness start taking this medication 30 to 60 minutes before you leave. Talk to your care team regarding the use of this medication in children. Special care may be needed. Patients over 73 years old may have a stronger reaction and need a smaller dose. Overdosage: If you think you have taken too much of this medicine contact a poison control center or emergency room at once. NOTE: This medicine is only for you. Do not share this medicine with others. What if I miss a dose? If you miss a dose, take it as soon as you can. If it is almost time for your next dose, take only that dose. Do not take double or extra doses. What may interact with this medication? Do not take this medication with any of the following: MAOIs like Carbex, Eldepryl, Marplan, Nardil, and Parnate This medication may also interact with the  following: Alcohol Barbiturates, like phenobarbital Medications for bladder spasm like oxybutynin, tolterodine Medications for blood pressure Medications for depression, anxiety, or psychotic disturbances Medications for movement abnormalities or Parkinson's disease Medications for sleep Other medications for cold, cough or allergy Some medications for the stomach like chlordiazepoxide, dicyclomine This list may not describe all possible interactions. Give your health care provider a list of all the medicines, herbs, non-prescription drugs, or dietary supplements you use. Also tell them if you smoke, drink alcohol, or use illegal drugs. Some items may interact with your medicine. What should I watch for while using this medication? Visit your care team for regular check-ups. Tell your care team if your symptoms do not improve or if they get worse. Your mouth may get dry. Chewing sugarless gum or sucking hard candy, and drinking plenty of water may help. Contact your care team if the problem does not go away or is severe. This medication may cause dry eyes and blurred vision. If you wear contact lenses you may feel some discomfort. Lubricating drops may help. See your eye doctor if the problem does not go away or is severe. You may get drowsy or dizzy.  Do not drive, use machinery, or do anything that needs mental alertness until you know how this medication affects you. Do not stand or sit up quickly, especially if you are an older patient. This reduces the risk of dizzy or fainting spells. Alcohol may interfere with the effect of this medication. Avoid alcoholic drinks. What side effects may I notice from receiving this medication? Side effects that you should report to your care team as soon as possible: Allergic reactions-skin rash, itching, hives, swelling of the face, lips, tongue, or throat Sudden eye pain or change in vision such as blurry vision, seeing halos around lights, vision  loss Trouble passing urine Side effects that usually do not require medical attention (report to your care team if they continue or are bothersome): Constipation Drowsiness Dry mouth Headache Upset stomach This list may not describe all possible side effects. Call your doctor for medical advice about side effects. You may report side effects to FDA at 1-800-FDA-1088. Where should I keep my medication? Keep out of the reach of children and pets. This medication can be abused. Keep your medication in a safe place. Store at room temperature between 15 and 30 degrees C (59 and 86 degrees F). Keep container closed tightly. Throw away any unused medication after the expiration date. NOTE: This sheet is a summary. It may not cover all possible information. If you have questions about this medicine, talk to your doctor, pharmacist, or health care provider.  2022 Elsevier/Gold Standard (2020-10-27 11:53:52)   Daratumumab; Hyaluronidase Injection What is this medication? DARATUMUMAB; HYALURONIDASE (dar a toom ue mab / hye al ur ON i dase) is a monoclonal antibody. Hyaluronidase is used to improve the effects of daratumumab. It treats certain types of cancer. Some of the cancers treated are multiple myeloma and light-chain amyloidosis. This medicine may be used for other purposes; ask your health care provider or pharmacist if you have questions. COMMON BRAND NAME(S): DARZALEX FASPRO What should I tell my care team before I take this medication? They need to know if you have any of these conditions: heart disease infection especially a viral infection such as chickenpox, cold sores, herpes, or hepatitis B lung or breathing disease an unusual or allergic reaction to daratumumab, hyaluronidase, other medicines, foods, dyes, or preservatives pregnant or trying to get pregnant breast-feeding How should I use this medication? This medicine is for injection under the skin. It is given by a health care  professional in a hospital or clinic setting. Talk to your pediatrician regarding the use of this medicine in children. Special care may be needed. Overdosage: If you think you have taken too much of this medicine contact a poison control center or emergency room at once. NOTE: This medicine is only for you. Do not share this medicine with others. What if I miss a dose? Keep appointments for follow-up doses as directed. It is important not to miss your dose. Call your doctor or health care professional if you are unable to keep an appointment. What may interact with this medication? Interactions have not been studied. This list may not describe all possible interactions. Give your health care provider a list of all the medicines, herbs, non-prescription drugs, or dietary supplements you use. Also tell them if you smoke, drink alcohol, or use illegal drugs. Some items may interact with your medicine. What should I watch for while using this medication? Your condition will be monitored carefully while you are receiving this medicine. This medicine can cause serious allergic  reactions. To reduce your risk, your health care provider may give you other medicine to take before receiving this one. Be sure to follow the directions from your health care provider. This medicine can affect the results of blood tests to match your blood type. These changes can last for up to 6 months after the final dose. Your healthcare provider will do blood tests to match your blood type before you start treatment. Tell all of your healthcare providers that you are being treated with this medicine before receiving a blood transfusion. This medicine can affect the results of some tests used to determine treatment response; extra tests may be needed to evaluate response. Do not become pregnant while taking this medicine or for 3 months after stopping it. Women should inform their health care provider if they wish to become  pregnant or think they might be pregnant. There is a potential for serious side effects to an unborn child. Talk to your health care provider for more information. Do not breast-feed an infant while taking this medicine. What side effects may I notice from receiving this medication? Side effects that you should report to your doctor or health care professional as soon as possible: allergic reactions like skin rash, itching or hives, swelling of the face, lips, or tongue blurred vision breathing problems chest pain cough dizziness fast heartbeat feeling faint or lightheaded headache low blood counts - this medicine may decrease the number of Khaliq Turay blood cells, red blood cells and platelets. You may be at increased risk for infections and bleeding. nausea, vomiting shortness of breath signs of decreased platelets or bleeding - bruising, pinpoint red spots on the skin, black, tarry stools, blood in the urine signs of decreased red blood cells - unusually weak or tired, feeling faint or lightheaded, falls signs of infection - fever or chills, cough, sore throat, pain or difficulty passing urine Side effects that usually do not require medical attention (report these to your doctor or health care professional if they continue or are bothersome): back pain constipation diarrhea pain, tingling, numbness in the hands or feet muscle cramps swelling of the ankles, feet, hands tiredness trouble sleeping This list may not describe all possible side effects. Call your doctor for medical advice about side effects. You may report side effects to FDA at 1-800-FDA-1088. Where should I keep my medication? This drug is given in a hospital or clinic and will not be stored at home. NOTE: This sheet is a summary. It may not cover all possible information. If you have questions about this medicine, talk to your doctor, pharmacist, or health care provider.  2022 Elsevier/Gold Standard (2020-11-09  12:51:54)

## 2021-08-17 ENCOUNTER — Encounter: Payer: Self-pay | Admitting: Family Medicine

## 2021-08-17 ENCOUNTER — Encounter: Payer: Self-pay | Admitting: Oncology

## 2021-08-17 LAB — KAPPA/LAMBDA LIGHT CHAINS
Kappa free light chain: 21.7 mg/L — ABNORMAL HIGH (ref 3.3–19.4)
Kappa, lambda light chain ratio: 1.95 — ABNORMAL HIGH (ref 0.26–1.65)
Lambda free light chains: 11.1 mg/L (ref 5.7–26.3)

## 2021-08-17 NOTE — Telephone Encounter (Signed)
Called and spoke with patient; he was trying to get his wife scheduled for her appt. I was able to get her scheduled for 09/04/21 at 2:00 pm.

## 2021-08-17 NOTE — Telephone Encounter (Signed)
LMTCB to schedule New Pt appt

## 2021-08-17 NOTE — Telephone Encounter (Signed)
Please advise on skeletal survey results.

## 2021-08-19 NOTE — Progress Notes (Signed)
   HPI: 65 y.o. male presenting today as a new patient for evaluation of pain and tenderness for the right foot which has been going on for about 2 weeks now.  Gradual onset.  No injuries.  He noticed swelling on top of his foot.  He is unable to apply pressure and has been experiencing arch pain.  Patient also states that he has toenail fungus to the left foot and would like to have it evaluated today.  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   Fatty liver    on u/s 07/2018   Hyperlipidemia 03/2004   Hypertension 1994   Multiple myeloma ([facility])    Snores      Physical Exam: General: The patient is alert and oriented x3 in no acute distress.  Dermatology: Skin is warm, dry and supple bilateral lower extremities. Negative for open lesions or macerations.  Hyperkeratotic dystrophic nail noted to the left hallux.  Asymptomatic.  Vascular: Palpable pedal pulses bilaterally. No edema or erythema noted. Capillary refill within normal limits.  Neurological: Epicritic and protective threshold grossly intact bilaterally.   Musculoskeletal Exam: No pedal deformities noted tenderness to palpation along the second metatarsal of the right foot  Radiographic Exam:  Normal osseous mineralization. Joint spaces preserved.  Subtle stress reaction fracture noted to the second metatarsal of the right foot  Assessment: 1.  Stress reaction fracture second metatarsal right 2.  Onychomycosis of the left hallux; asymptomatic   Plan of Care:  1. Patient evaluated. X-Rays reviewed.  2.  Recommend conservative treatments including rest ice compression and elevation 3.  Cam boot dispensed.  Weightbearing as tolerated x6 weeks 4.  Ace wrap provided.  Recommend Ace wrap daily 5.  Currently the left hallux toenail fungus is completely asymptomatic.  No  treatment at the moment.  Simple observation for now  6.  Return to clinic in 6 weeks      Edrick Kins, DPM [facility]  Dr. Edrick Kins, DPM    [address], [place] [address]                Office [phone]  Fax [phone]

## 2021-08-20 ENCOUNTER — Ambulatory Visit: Payer: Medicare Other | Admitting: Physical Therapy

## 2021-08-20 ENCOUNTER — Encounter: Payer: Self-pay | Admitting: Physical Therapy

## 2021-08-20 DIAGNOSIS — M542 Cervicalgia: Secondary | ICD-10-CM

## 2021-08-20 NOTE — Therapy (Signed)
[facility] [address] [place], [address] Phone: [phone]   Fax:  [phone]  Physical Therapy Treatment  Patient Details  Name: Luis Burke MRN: [id number] Date of Birth: Mar 09, 1956 No data recorded  Encounter Date: 08/20/2021   PT End of Session - 08/20/21 0751     Visit Number 7    Number of Visits 17    Date for PT Re-Evaluation 09/24/21    Authorization - Visit Number 7    Progress Note Due on Visit 10    PT Start Time 0748    PT Stop Time 0826    PT Time Calculation (min) 38 min    Activity Tolerance Patient tolerated treatment well    Behavior During Therapy [facility] for tasks assessed/performed             Past Medical History:  Diagnosis Date   AKI (acute kidney injury) ([place]) 01/06/2019   Anemia    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   Fatty liver    on u/s 07/2018   Hyperlipidemia 03/2004   Hypertension 1994   Multiple myeloma ([place])    Snores     Past Surgical History:  Procedure Laterality Date   CATARACT EXTRACTION W/PHACO Right 03/04/2018   Procedure: CATARACT EXTRACTION PHACO AND INTRAOCULAR LENS PLACEMENT ([place])  RIGHT TORIC;  Surgeon: Leandrew Koyanagi, MD;  Location: [facility];  Service: Ophthalmology;  Laterality: Right;  [place] no Toric Lens 1:45 5.3   COLONOSCOPY WITH PROPOFOL N/A 07/20/2018   Procedure: COLONOSCOPY WITH PROPOFOL;  Surgeon: Jonathon Bellows, MD;  Location: [facility] ENDOSCOPY;  Service: Gastroenterology;  Laterality: N/A;   ESOPHAGOGASTRODUODENOSCOPY (EGD) WITH PROPOFOL N/A 07/20/2018   Procedure: ESOPHAGOGASTRODUODENOSCOPY (EGD) WITH PROPOFOL;  Surgeon: Jonathon Bellows, MD;  Location: [facility] ENDOSCOPY;  Service: Gastroenterology;  Laterality: N/A;   EYE SURGERY Right    HERNIA REPAIR     L Lap herniorraphy  04/2000   Left wrist ganglionectomy      There were no vitals filed for this  visit.   Subjective Assessment - 08/20/21 0750     Subjective Pt denies any neck pain or tightness/soreness this visit.    Pertinent History Luis Burke is a 65 year old male with primary c/o cervical pain with L side > R secondary to numbness in the R foot. He reports his neck pain began in 2019 without MOI but has experienced radiating pain down L UE. He reports difficulty with sitting, lifting, and performing household chores. He reports having a history of repetitive lifitng and now reports having a lifttng restriction of 10 pounds. He states his pain is alleviated with rest, change of position, and the use of salonpas patch. He reports having difficulty sleeping due to pain but it s usually able to reposition and return to sleep. He reports his pain at best is 0/10 and 5-6/10 at worst. Pt denies any unexplained weight fluctuation, saddle paresthesia, loss of bowel/bladder function, or unrelenting night pain at this time. Pt is currently undergoing chemotherapy treatment for multiple myeloma ever Thursday and reports taking a steroid Dexamethosone weekly. Pt has a past medical history of bone marrow transplant is being followed by [facility] oncology. Pt other PMH HTN, hyperlipidemia, lumbar stenosis, and anemia.    Limitations Lifting;Sitting;House hold activities    How long can you sit comfortably? 45 min    How long can you stand comfortably?  hours    How long can you walk comfortably? 15-21mn    Diagnostic tests xray-DJD    Patient Stated Goals Reduce radiating symptoms and reduce neck pain             Therex   Nu Step L2 x 5 min UE only seat 8 SPM > 75   Seated Omega row 35# 1 x 10; 2 x 10 reps #25 with focus on maintained cervical retraction and scapular retraction without excessive elevation   Supine PVC pull over 7.5# 3 x 10 reps with cues to scapular   Deep cervical flexion 3 x 15-20 sec with tactile cues for appropriated head lift height   T spine rotation on wall 1 x 12  reps x 3-5 sec each direction hold with cues and demonstration for initial setup  Upper Trap Stretch 3 x 30sec   Cervical extensor stretch  3 x 30 sec   Manual Therapy Sub Occipital release 1 x 60 sec    Cervical Distraction 6 bouts 10sec on/10sec off   Distraction with rotation and G3 mob x12 to each side  SCM Stretch 2 x 30 sec     *manual therapy performed in supine for increased tissue extensibility and pain modulation.                             PT Education - 08/20/21 0753     Education Details therex form/technique    Person(s) Educated Patient    Methods Explanation;Demonstration    Comprehension Verbalized understanding;Returned demonstration              PT Short Term Goals - 07/30/21 1115       PT SHORT TERM GOAL #1   Title Pt will demonstrate independence with HEP to improve cervical function for increased ability to participate with ADLs    Baseline HEP given    Time 4    Period Weeks    Status New    Target Date 08/27/21               PT Long Term Goals - 07/30/21 1241       PT LONG TERM GOAL #1   Title Patient will increase FOTO score to ** to demonstrate predicted increase in functional mobility to complete ADLs    Baseline 07/30/21:    Time 8    Period Weeks    Status New    Target Date 09/24/21      PT LONG TERM GOAL #2   Title Pt will decrease worst cervical pain as reported on NPRS by at least 2 points in order to demonstrate clinically significant reduction in cercvical pain.    Baseline 07/30/21: 6/10    Time 8    Period Weeks    Status New    Target Date 09/24/21      PT LONG TERM GOAL #3   Title Pt will improve active cervical rotation to 60 degrees and lateral flexion to 45 in order to drive.    Baseline Rotation 45 deg; lateral flexion approximately 20 deg    Time 8    Status New    Target Date 09/24/21                   Plan - 08/20/21 0830     Clinical Impression Statement PT  continued therex progression for increased periscapular and DNF strength, and increased cervical and thoracic mobility with  success. Patient is able to comply with all cuing for proper technique of therex with good motivation throughout session. Pt tolerated session well with no increase in pain and increase cervical mobility. Patient is progressing towards discharge.    Personal Factors and Comorbidities Age;Time since onset of injury/illness/exacerbation;Comorbidity 2    Comorbidities Multiple myeloma, chemotherapy, obesity    Examination-Activity Limitations Sit;Sleep;Lift    Examination-Participation Restrictions Driving;Cleaning;Community Activity    Stability/Clinical Decision Making Stable/Uncomplicated    Clinical Decision Making Moderate    Rehab Potential Good    PT Frequency 2x / week    PT Duration 8 weeks    PT Treatment/Interventions ADLs/Self Care Home Management;Cryotherapy;Traction;Therapeutic exercise;Therapeutic activities;Patient/family education;Manual techniques;Passive range of motion;Dry needling;Spinal Manipulations;Joint Manipulations;Neuromuscular re-education;Electrical Stimulation    PT Next Visit Plan manual therapy and cervical mobility    PT Home Exercise Plan chin tucks, Snags, Upper traps stretch    Consulted and Agree with Plan of Care Patient             Patient will benefit from skilled therapeutic intervention in order to improve the following deficits and impairments:  Hypomobility, Pain, Postural dysfunction, Decreased range of motion, Obesity  Visit Diagnosis: Neck pain     Problem List Patient Active Problem List   Diagnosis Date Noted   Medicare welcome exam 07/26/2021   Chemotherapy induced neutropenia ([place]) 07/19/2021   Bone lesion 06/05/2021   Chronic midline low back pain without sciatica 06/05/2021   Neck pain, chronic 08/11/2020   Lumbar degenerative disc disease 08/10/2020   Lumbar foraminal stenosis 08/09/2020    Thrombocytopenia ([place]) 07/20/2020   Encounter for antineoplastic chemotherapy 07/20/2020   Stage 3a chronic kidney disease ([place]) 07/20/2020   Productive cough 03/08/2020   Carotid arterial disease ([place]) 11/26/2019   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[place])    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/23/2019   Leg pain 03/03/2019   AKI (acute kidney injury) ([place]) 01/06/2019   Multiple myeloma ([place]) 08/26/2018   Goals of care, counseling/discussion 08/26/2018   Fatty liver 07/29/2018   Anemia 07/06/2018   Creatinine elevation 07/06/2018   Headache 07/02/2018   Snoring 06/10/2018   FH: colon cancer 06/10/2018   Advance care planning 11/24/2015   Routine general medical examination at a health care facility 04/29/2012   ESSENTIAL HYPERTENSION, BENIGN 01/20/2008   HLD (hyperlipidemia) 06/02/2007   ERECTILE DYSFUNCTION, 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, [place], [address] Phone: [phone]   Fax:  [phone]  Name: Luis Burke MRN: [id number] Date of Birth: July 18, 1956

## 2021-08-20 NOTE — Telephone Encounter (Signed)
CALLED PATIENT WIFE AND GOT HER SCHEDULED FOR 11/22 @ 2PM

## 2021-08-21 ENCOUNTER — Other Ambulatory Visit: Payer: Self-pay | Admitting: Pharmacist

## 2021-08-21 DIAGNOSIS — C9000 Multiple myeloma not having achieved remission: Secondary | ICD-10-CM

## 2021-08-21 LAB — MULTIPLE MYELOMA PANEL, SERUM
Albumin SerPl Elph-Mcnc: 3.8 g/dL (ref 2.9–4.4)
Albumin/Glob SerPl: 1.5 (ref 0.7–1.7)
Alpha 1: 0.3 g/dL (ref 0.0–0.4)
Alpha2 Glob SerPl Elph-Mcnc: 0.8 g/dL (ref 0.4–1.0)
B-Globulin SerPl Elph-Mcnc: 1.1 g/dL (ref 0.7–1.3)
Gamma Glob SerPl Elph-Mcnc: 0.4 g/dL (ref 0.4–1.8)
Globulin, Total: 2.6 g/dL (ref 2.2–3.9)
IgA: 79 mg/dL (ref 61–437)
IgG (Immunoglobin G), Serum: 539 mg/dL — ABNORMAL LOW (ref 603–1613)
IgM (Immunoglobulin M), Srm: 7 mg/dL — ABNORMAL LOW (ref 20–172)
M Protein SerPl Elph-Mcnc: 0.1 g/dL — ABNORMAL HIGH
Total Protein ELP: 6.4 g/dL (ref 6.0–8.5)

## 2021-08-21 MED ORDER — POMALIDOMIDE 2 MG PO CAPS: 2.0000 mg | ORAL_CAPSULE | Freq: Every day | ORAL | 0 refills | Status: DC

## 2021-08-22 ENCOUNTER — Encounter: Payer: Self-pay | Admitting: Physical Therapy

## 2021-08-22 ENCOUNTER — Ambulatory Visit: Payer: Medicare Other | Admitting: Physical Therapy

## 2021-08-22 DIAGNOSIS — M542 Cervicalgia: Secondary | ICD-10-CM | POA: Diagnosis not present

## 2021-08-22 NOTE — Therapy (Signed)
[facility] [address] [place], [address] Phone: [phone]   Fax:  [phone]  Physical Therapy Treatment Discharge Summary Reporting Period 07/30/21-08/22/21  Patient Details  Name: Luis Burke MRN: [id number] Date of Birth: 03/15/1956 No data recorded  Encounter Date: 08/22/2021   PT End of Session - 08/22/21 0810     Visit Number 8    Number of Visits 17    Date for PT Re-Evaluation 09/24/21    Authorization - Visit Number 8    Progress Note Due on Visit 10    PT Start Time 0815    PT Stop Time 0840    PT Time Calculation (min) 25 min    Activity Tolerance Patient tolerated treatment well    Behavior During Therapy [facility] for tasks assessed/performed             Past Medical History:  Diagnosis Date   AKI (acute kidney injury) ([place]) 01/06/2019   Anemia    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   Fatty liver    on u/s 07/2018   Hyperlipidemia 03/2004   Hypertension 1994   Multiple myeloma ([place])    Snores     Past Surgical History:  Procedure Laterality Date   CATARACT EXTRACTION W/PHACO Right 03/04/2018   Procedure: CATARACT EXTRACTION PHACO AND INTRAOCULAR LENS PLACEMENT ([place])  RIGHT TORIC;  Surgeon: Leandrew Koyanagi, MD;  Location: [facility];  Service: Ophthalmology;  Laterality: Right;  [place] no Toric Lens 1:45 5.3   COLONOSCOPY WITH PROPOFOL N/A 07/20/2018   Procedure: COLONOSCOPY WITH PROPOFOL;  Surgeon: Jonathon Bellows, MD;  Location: [facility] ENDOSCOPY;  Service: Gastroenterology;  Laterality: N/A;   ESOPHAGOGASTRODUODENOSCOPY (EGD) WITH PROPOFOL N/A 07/20/2018   Procedure: ESOPHAGOGASTRODUODENOSCOPY (EGD) WITH PROPOFOL;  Surgeon: Jonathon Bellows, MD;  Location: [facility] ENDOSCOPY;  Service: Gastroenterology;  Laterality: N/A;   EYE SURGERY Right    HERNIA REPAIR     L Lap herniorraphy  04/2000   Left wrist  ganglionectomy      There were no vitals filed for this visit.   Subjective Assessment - 08/22/21 0818     Subjective Pt reports feeling 100% better since starting PT. He reports independence with HEP and is comfortable discharging.    Pertinent History Luis Burke is a 65 year old male with primary c/o cervical pain with L side > R secondary to numbness in the R foot. He reports his neck pain began in 2019 without MOI but has experienced radiating pain down L UE. He reports difficulty with sitting, lifting, and performing household chores. He reports having a history of repetitive lifitng and now reports having a lifttng restriction of 10 pounds. He states his pain is alleviated with rest, change of position, and the use of salonpas patch. He reports having difficulty sleeping due to pain but it s usually able to reposition and return to sleep. He reports his pain at best is 0/10 and 5-6/10 at worst. Pt denies any unexplained weight fluctuation, saddle paresthesia, loss of bowel/bladder function, or unrelenting night pain at this time. Pt is currently undergoing chemotherapy treatment for multiple myeloma ever Thursday and reports taking a steroid Dexamethosone weekly. Pt has a past medical history of bone marrow transplant is being followed by [facility] oncology. Pt other PMH HTN, hyperlipidemia, lumbar stenosis, and anemia.    Limitations Lifting;Sitting;House hold activities    How long can you  sit comfortably? 45 min    How long can you stand comfortably? hours    How long can you walk comfortably? 15-64mn    Diagnostic tests xray-DJD    Patient Stated Goals Reduce radiating symptoms and reduce neck pain              Therex  Nu Step L2 x 4 min seat 8 UE 10  Access Code: [email or web address]  Exercises Seated Assisted Cervical Rotation with Towel - 1 x daily - 7 x weekly - 1 sets - 12-15 reps Seated Upper Trapezius Stretch - 1 x daily - 7 x weekly - 3 sets - 3 reps - 30 sec hold Supine Chin  Tuck - 1 x daily - 7 x weekly - 1 sets - 12-15 reps Seated Cervical Sidebending with Strap and Overpressure - 1 x daily - 7 x weekly - 3 sets - 10 reps Sidelying Thoracic Rotation - 1 x daily - 7 x weekly - 1 sets - 3 reps - 30 sec hold    Cervical Rotation: R/L 65/60 Cervical Sidebending 45/45      *Patient educated on progress to date and given HEP to continue exercise post PT.                 PT Education - 08/22/21 0844     Education Details Pt educated on HEP and importance of exercise post PT.    Person(s) Educated Patient    Methods Explanation;Demonstration    Comprehension Verbalized understanding;Returned demonstration              PT Short Term Goals - 08/22/21 0812       PT SHORT TERM GOAL #1   Title Pt will demonstrate independence with HEP to improve cervical function for increased ability to participate with ADLs    Baseline HEP given 08/22/21:    Time 4    Period Weeks    Status Achieved    Target Date 08/27/21               PT Long Term Goals - 08/22/21 0822       PT LONG TERM GOAL #1   Title Patient will increase FOTO score to 53 to demonstrate predicted increase in functional mobility to complete ADLs    Baseline 07/30/21:53  08/22/21:74    Time 8    Period Weeks    Status Achieved      PT LONG TERM GOAL #2   Title Pt will decrease worst cervical pain as reported on NPRS by at least 2 points in order to demonstrate clinically significant reduction in cercvical pain.    Baseline 07/30/21: 6/10 08/22/21: 1/10    Time 8    Period Weeks    Status Achieved      PT LONG TERM GOAL #3   Title Pt will improve active cervical rotation to 60 degrees and lateral flexion to 45 in order to drive.    Baseline Rotation 45 deg; lateral flexion approximately 20 deg 08/22/21: RotationR/L 60/65 deg; lateral flexion R/L 45/45 deg    Time 8    Status Achieved                   Plan - 08/22/21 0837     Clinical Impression  Statement Patient is aware of discharge and prognosis. Patient verbalized understanding of discharge recommendations, achieved progress, and importance of continuation of exercise post PT.Overall, patient has demonstrated increased cervical AROM, decreased cervical pain,  increased  functional mobility, and is indepenendent with HEP. An HEP was provided and reviewed. The patient was able to demonstrate and verbalize understanding of exercises and prescription. Patient has achieved all set goals. End PT POC this episode.    Personal Factors and Comorbidities Age;Time since onset of injury/illness/exacerbation;Comorbidity 2    Comorbidities Multiple myeloma, chemotherapy, obesity    Examination-Activity Limitations Sit;Sleep;Lift    Examination-Participation Restrictions Driving;Cleaning;Community Activity    Stability/Clinical Decision Making Stable/Uncomplicated    Clinical Decision Making Moderate    Rehab Potential Good    PT Frequency 2x / week    PT Duration 8 weeks    PT Treatment/Interventions ADLs/Self Care Home Management;Cryotherapy;Traction;Therapeutic exercise;Therapeutic activities;Patient/family education;Manual techniques;Passive range of motion;Dry needling;Spinal Manipulations;Joint Manipulations;Neuromuscular re-education;Electrical Stimulation    PT Next Visit Plan manual therapy and cervical mobility    PT Home Exercise Plan chin tucks, Snags, Upper traps stretch    Consulted and Agree with Plan of Care Patient             Patient will benefit from skilled therapeutic intervention in order to improve the following deficits and impairments:  Hypomobility, Pain, Postural dysfunction, Decreased range of motion, Obesity  Visit Diagnosis: Neck pain     Problem List Patient Active Problem List   Diagnosis Date Noted   Medicare welcome exam 07/26/2021   Chemotherapy induced neutropenia ([place]) 07/19/2021   Bone lesion 06/05/2021   Chronic midline low back pain without  sciatica 06/05/2021   Neck pain, chronic 08/11/2020   Lumbar degenerative disc disease 08/10/2020   Lumbar foraminal stenosis 08/09/2020   Thrombocytopenia ([place]) 07/20/2020   Encounter for antineoplastic chemotherapy 07/20/2020   Stage 3a chronic kidney disease ([place]) 07/20/2020   Productive cough 03/08/2020   Carotid arterial disease ([place]) 11/26/2019   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[place])    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[place]) 04/23/2019   Leg pain 03/03/2019   AKI (acute kidney injury) ([place]) 01/06/2019   Multiple myeloma ([place]) 08/26/2018   Goals of care, counseling/discussion 08/26/2018   Fatty liver 07/29/2018   Anemia 07/06/2018   Creatinine elevation 07/06/2018   Headache 07/02/2018   Snoring 06/10/2018   FH: colon cancer 06/10/2018   Advance care planning 11/24/2015   Routine general medical examination at a health care facility 04/29/2012   ESSENTIAL HYPERTENSION, BENIGN 01/20/2008   HLD (hyperlipidemia) 06/02/2007   ERECTILE DYSFUNCTION, 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, [place], [address] Phone: [phone]   Fax:  [phone]  Name: Luis Burke MRN: [id number] Date of Birth: 05-22-1956

## 2021-08-27 ENCOUNTER — Ambulatory Visit: Payer: Medicare Other | Admitting: Physical Therapy

## 2021-08-29 ENCOUNTER — Ambulatory Visit: Payer: Medicare Other | Admitting: Physical Therapy

## 2021-08-30 ENCOUNTER — Inpatient Hospital Stay: Payer: Medicare Other

## 2021-08-30 ENCOUNTER — Encounter: Payer: Self-pay | Admitting: Oncology

## 2021-08-30 ENCOUNTER — Other Ambulatory Visit: Payer: Self-pay

## 2021-08-30 ENCOUNTER — Inpatient Hospital Stay (HOSPITAL_BASED_OUTPATIENT_CLINIC_OR_DEPARTMENT_OTHER): Payer: Medicare Other | Admitting: Oncology

## 2021-08-30 VITALS — BP 136/75 | HR 56 | Temp 97.0°F | Resp 18

## 2021-08-30 VITALS — BP 132/89 | HR 60 | Temp 97.2°F | Resp 18 | Wt 207.1 lb

## 2021-08-30 DIAGNOSIS — Z5111 Encounter for antineoplastic chemotherapy: Secondary | ICD-10-CM

## 2021-08-30 DIAGNOSIS — N1831 Chronic kidney disease, stage 3a: Secondary | ICD-10-CM

## 2021-08-30 DIAGNOSIS — C9000 Multiple myeloma not having achieved remission: Secondary | ICD-10-CM | POA: Diagnosis not present

## 2021-08-30 DIAGNOSIS — Z5112 Encounter for antineoplastic immunotherapy: Secondary | ICD-10-CM | POA: Diagnosis not present

## 2021-08-30 DIAGNOSIS — M899 Disorder of bone, unspecified: Secondary | ICD-10-CM | POA: Diagnosis not present

## 2021-08-30 DIAGNOSIS — C9002 Multiple myeloma in relapse: Secondary | ICD-10-CM

## 2021-08-30 DIAGNOSIS — D696 Thrombocytopenia, unspecified: Secondary | ICD-10-CM

## 2021-08-30 LAB — CBC WITH DIFFERENTIAL/PLATELET
Abs Immature Granulocytes: 0.01 10*3/uL (ref 0.00–0.07)
Basophils Absolute: 0 10*3/uL (ref 0.0–0.1)
Basophils Relative: 1 %
Eosinophils Absolute: 0.2 10*3/uL (ref 0.0–0.5)
Eosinophils Relative: 5 %
HCT: 36.2 % — ABNORMAL LOW (ref 39.0–52.0)
Hemoglobin: 12.3 g/dL — ABNORMAL LOW (ref 13.0–17.0)
Immature Granulocytes: 0 %
Lymphocytes Relative: 41 %
Lymphs Abs: 1.5 10*3/uL (ref 0.7–4.0)
MCH: 34.5 pg — ABNORMAL HIGH (ref 26.0–34.0)
MCHC: 34 g/dL (ref 30.0–36.0)
MCV: 101.4 fL — ABNORMAL HIGH (ref 80.0–100.0)
Monocytes Absolute: 0.7 10*3/uL (ref 0.1–1.0)
Monocytes Relative: 18 %
Neutro Abs: 1.3 10*3/uL — ABNORMAL LOW (ref 1.7–7.7)
Neutrophils Relative %: 35 %
Platelets: 104 10*3/uL — ABNORMAL LOW (ref 150–400)
RBC: 3.57 MIL/uL — ABNORMAL LOW (ref 4.22–5.81)
RDW: 14.1 % (ref 11.5–15.5)
WBC: 3.8 10*3/uL — ABNORMAL LOW (ref 4.0–10.5)
nRBC: 0 % (ref 0.0–0.2)

## 2021-08-30 LAB — COMPREHENSIVE METABOLIC PANEL
ALT: 21 U/L (ref 0–44)
AST: 16 U/L (ref 15–41)
Albumin: 4.1 g/dL (ref 3.5–5.0)
Alkaline Phosphatase: 66 U/L (ref 38–126)
Anion gap: 6 (ref 5–15)
BUN: 18 mg/dL (ref 8–23)
CO2: 28 mmol/L (ref 22–32)
Calcium: 9.3 mg/dL (ref 8.9–10.3)
Chloride: 102 mmol/L (ref 98–111)
Creatinine, Ser: 1.3 mg/dL — ABNORMAL HIGH (ref 0.61–1.24)
GFR, Estimated: >60 mL/min (ref >60)
Glucose, Bld: 97 mg/dL (ref 70–99)
Potassium: 4.6 mmol/L (ref 3.5–5.1)
Sodium: 136 mmol/L (ref 135–145)
Total Bilirubin: 0.7 mg/dL (ref 0.3–1.2)
Total Protein: 6.8 g/dL (ref 6.5–8.1)

## 2021-08-30 MED ORDER — DARATUMUMAB-HYALURONIDASE-FIHJ 1800-30000 MG-UT/15ML ~~LOC~~ SOLN
1800.0000 mg | Freq: Once | SUBCUTANEOUS | Status: AC
  Administered 2021-08-30: 11:00:00 1800 mg via SUBCUTANEOUS
  Filled 2021-08-30: qty 15

## 2021-08-30 MED ORDER — ACETAMINOPHEN 325 MG PO TABS
650.0000 mg | ORAL_TABLET | Freq: Once | ORAL | Status: AC
  Administered 2021-08-30: 10:00:00 650 mg via ORAL
  Filled 2021-08-30: qty 2

## 2021-08-30 MED ORDER — SODIUM CHLORIDE 0.9 % IV SOLN
Freq: Once | INTRAVENOUS | Status: DC
  Filled 2021-08-30: qty 250

## 2021-08-30 MED ORDER — DIPHENHYDRAMINE HCL 25 MG PO CAPS
50.0000 mg | ORAL_CAPSULE | Freq: Once | ORAL | Status: AC
  Administered 2021-08-30: 10:00:00 50 mg via ORAL
  Filled 2021-08-30: qty 2

## 2021-08-30 NOTE — Progress Notes (Signed)
Pt here for follow up. No new concerns voiced.   

## 2021-08-30 NOTE — Patient Instructions (Signed)
[facility] ONCOLOGY  Discharge Instructions: Thank you for choosing [facility] to provide your oncology and hematology care.  If you have a lab appointment with the [facility], please go directly to the [facility] and check in at the registration area.  Wear comfortable clothing and clothing appropriate for easy access to any Portacath or PICC line.   We strive to give you quality time with your provider. You may need to reschedule your appointment if you arrive late (15 or more minutes).  Arriving late affects you and other patients whose appointments are after yours.  Also, if you miss three or more appointments without notifying the office, you may be dismissed from the clinic at the provider's discretion.      For prescription refill requests, have your pharmacy contact our office and allow 72 hours for refills to be completed.    Today you received the following chemotherapy and/or immunotherapy agents Darzalex      To help prevent nausea and vomiting after your treatment, we encourage you to take your nausea medication as directed.  BELOW ARE SYMPTOMS THAT SHOULD BE REPORTED IMMEDIATELY: *FEVER GREATER THAN 100.4 F (38 C) OR HIGHER *CHILLS OR SWEATING *NAUSEA AND VOMITING THAT IS NOT CONTROLLED WITH YOUR NAUSEA MEDICATION *UNUSUAL SHORTNESS OF BREATH *UNUSUAL BRUISING OR BLEEDING *URINARY PROBLEMS (pain or burning when urinating, or frequent urination) *BOWEL PROBLEMS (unusual diarrhea, constipation, pain near the anus) TENDERNESS IN MOUTH AND THROAT WITH OR WITHOUT PRESENCE OF ULCERS (sore throat, sores in mouth, or a toothache) UNUSUAL RASH, SWELLING OR PAIN  UNUSUAL VAGINAL DISCHARGE OR ITCHING   Items with * indicate a potential emergency and should be followed up as soon as possible or go to the Emergency Department if any problems should occur.  Please show the CHEMOTHERAPY ALERT CARD or IMMUNOTHERAPY ALERT CARD at check-in to  the Emergency Department and triage nurse.  Should you have questions after your visit or need to cancel or reschedule your appointment, please contact [facility]  [phone] and follow the prompts.  Office hours are 8:00 a.m. to 4:30 p.m. Monday - Friday. Please note that voicemails left after 4:00 p.m. may not be returned until the following business day.  We are closed weekends and major holidays. You have access to a nurse at all times for urgent questions. Please call the main number to the clinic [phone] and follow the prompts.  For any non-urgent questions, you may also contact your provider using MyChart. We now offer e-Visits for anyone 4 and older to request care online for non-urgent symptoms. For details visit [email or web address].   Also download the MyChart app! Go to the app store, search "MyChart", open the app, select [facility], and log in with your MyChart username and password.  Due to Covid, a mask is required upon entering the hospital/clinic. If you do not have a mask, one will be given to you upon arrival. For doctor visits, patients may have 1 support person aged 67 or older with them. For treatment visits, patients cannot have anyone with them due to current Covid guidelines and our immunocompromised population.

## 2021-08-30 NOTE — Progress Notes (Signed)
Hematology/Oncology  Follow up note [phone] [phone]   Patient Care Team: Tonia Ghent, MD as PCP - General (Family Medicine) Earlie Server, MD as Consulting Physician (Oncology) Beather Arbour Lilyan Punt, MD as Referring Physician (Hematology and Oncology) Diannia Ruder, MD as Referring Physician (Hematology and Oncology)  REASON FOR VISIT Follow up for multiple myeloma.   HISTORY OF PRESENTING ILLNESS:  Luis Burke is a  65 y.o.  male with PMH listed below presents for follow up of multiple myeloma.  # 07/27/2018 multiple myeloma panel showed M protein of 0.1, IgG 662, IgA 49, IgM 7. Patient was called back to further labs done. 08/10/2018, free light chain ratio showed extremely high level of kappa free light chain 10,183, with a kappa lambda light chain ratio of 1414.31 LDH 164 Beta-2 microglobulin 5 Patient was called and discuss about results.  He was recommended to undergo bone marrow biopsy and PET scan. 08/19/2018 bone marrow biopsy showed hypercellular marrow 80%, involved by plasma cell neoplasm up to 95%.  Consistent with plasma cell myeloma. Myeloma FISH  gain of gain of CEP12  09/10/2018 skeletal survey showed questionable small lucent lesions within the midshaft of the humerus bilaterally.  Otherwise no suspicious focal lytic lesion or acute bone abnormality. 08/25/2018 PET scan showed no definite hypermetabolic bone disease but CT findings are highly suspicious for numerous small myelomatous lesions involving the spine, sternum and scattered ribs.  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 He received preparative regimen with melphalan 200 mg/m on 04/22/2019 followed by autologous stem cell infusion on 04/23/2019. Currently he is transfusion independent.  Transfusion criteria with as needed hemoglobin less than 7.5 for platelet less than 10,000.  Transplant course was complicated with febrile neutropenia grade 3,  treated with vancomycin and cephapirin until engraftment on 05/06/2019. Patient also had engraftment syndrome and had to be started on Solu-Medrol 25 mg twice daily on 05/05/2019.  Transition to Medrol Dosepak on 05/07/2019 to complete steroid taper as outpatient.  Patient is currently on acyclovir for 1 year after transplant, dose was reduced on 722 11/20/2018 due to renal function. Patient had a baseline CKD with creatinine running between 1.4-1.6.  He had acute on chronic kidney injury and creatinine went up to 2.2 on 722.  Fluid hydration was given and creatinine decreased to baseline 1.5-1.7. His Hickman catheter was removed.  Candida Cruris treated with topical clotrimazole 1% 3 times daily and to resolution..  #  seen by Dr. Tonia Brooms BMT team on 06/30/2019.revaccination at [place] begins at 12 months post transplant I discussed with [facility] hematology Dr.Choi and he recommends plans as listed below.  -patient to be started on Ixazomib maintenance: -Ixazomib 6m on D1/D8/D15 out of 28 day cycle.  -patient will start re-vaccination 12 months post transplant.  -Post transplant bone marrow biopsy if clinically indicated.  # seen by [facility] bone marrow transplant team in June 2021.  Patient has been started on immunization protocol.  He reports no new complaints.  #[facility]  Lucent lesion in the L4 spinous process measuring 4 mm which is  nonspecific. Consider confirmation of marrow replacing process with MRI.  2.  Incompletely characterized renal cysts   # He continues to have chronic back pain and neck pain.  [facility], MRI lumbar spine with and without contrast showed no evidence of spinal metastasis.  Lumbar spondylosis is most pronounced at L5-S1 where there  is severe right and moderate left neural foraminal stenosis. 08/11/2020, x-ray cervical spine complete with flexion and extension showed The cervical spine is visualized from C1 to the  top of T1 on the lateral  view.  Reversal of the normal cervical lordosis with a focal kyphosis centered at the C5-C6 intervertebral level. Trace anterolisthesis of C4 onto C5. Trace retrolisthesis of C6 on C7. No evidence of dynamic instability is noted on  the flexion or extension views.   No prevertebral soft tissue swelling. Cervical vertebral body heights are maintained. Multilevel degenerative changes of the cervical spine including discogenic disease, most pronounced at C5-C6 and C6-C7, and facet arthropathy most pronounced at C4-C5. Multilevel mild to moderate left neural foraminal osseous narrowing of the mid to lower cervical vertebrae, possibly centrally/artifactual by patient positioning.  #07/28/2020 bone marrow normocellular marrow with polytypic plasmacytosis, 3 to 8%   Patient is in remission.  Cytogenetics negative for trisomy 12.  Negative myeloma FISH panel.   # Lumbar lesion,  11/13/20 [facility]  MRI cervical sine w/wo contrast - no evidence of metastatic disease C3-4 severe spinal stenosis, left neural foraminal narrowing. C4-5 spinal canal stenosis. [facility] MRI results showed no metastatic spine lesion. Chronic back pain and neck pain, images are consistent with chronic degenerative disease.  Patient continues to follow-up with [facility] orthopedic surgeon.  04/03/2021, light chain ratio increased to 3.76.   05/15/2021 bone marrow biopsy showed slightly hypercellular bone marrow for age with slight plasmacytosis.  5% plasma cells with kappa light chain restriction.  Cytogenetics normal.  Myeloma FISH negative # 05/15/2021 bone marrow biopsy showed slightly hypercellular bone marrow for age with slight plasmacytosis.  5% plasma cells with kappa light chain restriction.  Cytogenetics normal.  Myeloma FISH negative  # 05/31/2021 Daratumumab -Pomalyst-dexamethasone-D1 patient had grade 2 infusion reaction to daratumumab and received steroids, Benadryl, famotidine.  He was able to finish treatment.D8  was able to finish Daratumumab with no infusion reactions.  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   INTERVAL HISTORY Kebron L Torrisi is a 65 y.o. male who has above history reviewed by me today for follow-up  Currently on Daratumumab Pomalyst dexamethasone Patient has skeletal survey done during interval. He has not a follow-up appointment with his podiatrist.  He currently wears brace.  Pain is better.    Review of Systems  Constitutional:  Negative for appetite change, chills, fatigue, fever and unexpected weight change.  HENT:   Negative for hearing loss and voice change.   Eyes:  Negative for eye problems and icterus.  Respiratory:  Negative for chest tightness, cough and shortness of breath.   Cardiovascular:  Negative for chest pain and leg swelling.  Gastrointestinal:  Negative for abdominal distention and abdominal pain.  Endocrine: Negative for hot flashes.  Genitourinary:  Negative for difficulty urinating, dysuria and frequency.   Musculoskeletal:  Positive for back pain and neck pain. Negative for arthralgias.  Skin:  Negative for itching and rash.  Neurological:  Negative for light-headedness and numbness.  Hematological:  Negative for adenopathy. Does not bruise/bleed easily.  Psychiatric/Behavioral:  Negative for confusion.    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   Fatty liver    on u/s 07/2018   Hyperlipidemia 03/2004   Hypertension 1994   Multiple myeloma (HCC)    Snores       SURGICAL HISTORY: Past Surgical History:  Procedure Laterality Date   CATARACT EXTRACTION W/PHACO Right 03/04/2018   Procedure: CATARACT EXTRACTION PHACO AND INTRAOCULAR LENS PLACEMENT (IOC)  RIGHT TORIC;  Surgeon: Brasington, Chadwick, MD;  Location: [facility];  Service: Ophthalmology;  Laterality:  Right;  [place] no Toric Lens 1:45 5.3   COLONOSCOPY WITH PROPOFOL N/A 07/20/2018   Procedure: COLONOSCOPY WITH PROPOFOL;  Surgeon: Anna, Kiran, MD;  Location: [facility];  Service: Gastroenterology;  Laterality: N/A;   ESOPHAGOGASTRODUODENOSCOPY (EGD) WITH PROPOFOL N/A 07/20/2018   Procedure: ESOPHAGOGASTRODUODENOSCOPY (EGD) WITH PROPOFOL;  Surgeon: Anna, Kiran, MD;  Location: [facility];  Service: Gastroenterology;  Laterality: N/A;   EYE SURGERY Right    HERNIA REPAIR     L Lap herniorraphy  04/2000   Left wrist ganglionectomy      SOCIAL HISTORY: Social History   Socioeconomic History   Marital status: Single    Spouse name: Not on file   Number of children: 1   Years of education: Not on file   Highest education level: Not on file  Occupational History   Occupation: [employer]  Tobacco Use   Smoking status: Never   Smokeless tobacco: Never   Tobacco comments:    Occassionally  Vaping Use   Vaping Use: Never used  Substance and Sexual Activity   Alcohol use: Not Currently    Alcohol/week: 3.0 standard drinks    Types: 3 Cans of beer per week   Drug use: No   Sexual activity: Not on file  Other Topics Concern   Not on file  Social History Narrative   Divorced 2009, married 05/27/21.     His daughter died 4 days after giving birth to the patient's granddaughter   Has joint custody of his deceased daughter's child   Worked at [employer] is [place], retired 2020.     Social Determinants of Health   Financial Resource Strain: Not on file  Food Insecurity: Not on file  Transportation Needs: Not on file  Physical Activity: Not on file  Stress: Not on file  Social Connections: Not on file  Intimate Partner Violence: Not on file    FAMILY HISTORY: Family History  Problem Relation Age of Onset   Hypertension Mother    Diabetes Mother    Heart disease Mother        CAD   Dementia Mother    Prostate cancer Father    Hypertension Father    Heart  disease Father        MI 02/03   Colon cancer Father    Hypertension Sister    Hypertension Sister    Hypertension Sister     ALLERGIES:  is allergic to bactrim [sulfamethoxazole-trimethoprim], clonidine derivatives, and tadalafil.  MEDICATIONS:  Current Outpatient Medications  Medication Sig Dispense Refill   acetaminophen (TYLENOL) 325 MG tablet Take 2 tablets (650 mg total) by mouth See admin instructions. 1 hour prior to chemotherapy treatments 30 tablet 0   amLODipine (NORVASC) 5 MG tablet TAKE 2 TABLETS BY MOUTH EVERY DAY 180 tablet 1   ASPIRIN 81 PO Take 81 mg by mouth daily.     B Complex Vitamins (VITAMIN B COMPLEX PO) SMARTSIG:By Mouth     chlorhexidine (PERIDEX) 0.12 % solution Use as directed 15 mLs in the mouth or throat 2 (two) times daily. 473 mL 1   co-enzyme Q-10 50 MG capsule Take 50 mg by mouth daily.     [employer] VITAMIN B12 1000 MCG tablet   TAKE 1 TABLET BY MOUTH EVERY DAY 90 tablet 3   cyclobenzaprine (FLEXERIL) 5 MG tablet Take 1 tablet (5 mg total) by mouth daily as needed for muscle spasms. 30 tablet 0   dexamethasone (DECADRON) 4 MG tablet Take 5 tablets (20 mg total) by mouth once a week. Take at least 1 hour prior to infusion appt. Take with food. To start on 06/07/21. 20 tablet 3   diphenhydrAMINE (BENADRYL) 50 MG tablet Take 1 tablet (50 mg total) by mouth See admin instructions. Take 1 tablet 1 hour prior to chemotherapy treatments. 30 tablet 0   hydrALAZINE (APRESOLINE) 10 MG tablet TAKE 1 TABLET(10 MG) BY MOUTH THREE TIMES DAILY 270 tablet 1   lisinopril (ZESTRIL) 20 MG tablet Take 1 tablet (20 mg total) by mouth daily. Please keep appt on 07/05/21 90 tablet 0   loperamide (IMODIUM) 2 MG capsule Take 1 capsule (2 mg total) by mouth See admin instructions. Take 2 capsules at the onset of diarrhea, then 1 capsule every 2 hours or after every loose bowel movement. Maximum 8 capsules per 24 hours. 60 capsule 0   LORazepam (ATIVAN) 1 MG tablet TAKE 1 TABLET (1 MG TOTAL)  BY MOUTH EVERY 6 (SIX) HOURS AS NEEDED FOR ANXIETY (FOR NAUSEA)     magnesium chloride (SLOW-MAG) 64 MG TBEC SR tablet Take 1 tablet (64 mg total) by mouth daily. 60 tablet 0   metoprolol succinate (TOPROL-XL) 50 MG 24 hr tablet TAKE 4 TABLETS (200 MG TOTAL) BY MOUTH DAILY. TAKE WITH OR IMMEDIATELY FOLLOWING A MEAL. 360 tablet 2   montelukast (SINGULAIR) 10 MG tablet Take 1 tablet (10 mg total) by mouth See admin instructions. Take 1day prior to chemotherapy 30 tablet 1   ondansetron (ZOFRAN) 8 MG tablet Take 1 tablet (8 mg total) by mouth every 8 (eight) hours as needed. for nausea 30 tablet 1   pomalidomide (POMALYST) 2 MG capsule Take 1 capsule (2 mg total) by mouth daily. Take for 21 days on, then hold for 7 days. Repeat every 28 days. 21 capsule 0   pravastatin (PRAVACHOL) 10 MG tablet Take 1 tablet (10 mg total) by mouth daily. 90 tablet 3   prochlorperazine (COMPAZINE) 10 MG tablet      sildenafil (REVATIO) 20 MG tablet Take 1-5 tablets (20-100 mg total) by mouth daily as needed. 50 tablet 12   zinc gluconate 50 MG tablet Take 50 mg by mouth daily.     No current facility-administered medications for this visit.   Facility-Administered Medications Ordered in Other Visits  Medication Dose Route Frequency Provider Last Rate Last Admin   0.9 %  sodium chloride infusion   Intravenous Once Elysse Polidore, MD         PHYSICAL EXAMINATION: ECOG PERFORMANCE STATUS: 1 - Symptomatic but completely ambulatory Vitals:   08/30/21 0841  BP: 132/89  Pulse: 60  Resp: 18  Temp: (!) 97.2 F (36.2 C)   Filed Weights   08/30/21 0841  Weight: 207 lb 1.6 oz (93.9 kg)    Physical Exam Constitutional:      General: He is not in acute distress. HENT:     Head: Normocephalic and atraumatic.  Eyes:     General: No scleral icterus. Cardiovascular:     Rate and Rhythm: Normal rate and regular rhythm.     Heart sounds: Normal heart sounds.  Pulmonary:     Effort: Pulmonary effort is normal. No  respiratory distress.     Breath sounds: No wheezing.    Abdominal:     General: Bowel sounds are normal. There is no distension.     Palpations: Abdomen is soft.  Musculoskeletal:        General: No deformity. Normal range of motion.     Cervical back: Normal range of motion and neck supple.  Skin:    General: Skin is warm and dry.     Findings: No erythema or rash.  Neurological:     Mental Status: He is alert and oriented to person, place, and time. Mental status is at baseline.     Cranial Nerves: No cranial nerve deficit.     Coordination: Coordination normal.  Psychiatric:        Mood and Affect: Mood normal.     LABORATORY DATA:  I have reviewed the data as listed Lab Results  Component Value Date   WBC 3.8 (L) 08/30/2021   HGB 12.3 (L) 08/30/2021   HCT 36.2 (L) 08/30/2021   MCV 101.4 (H) 08/30/2021   PLT 104 (L) 08/30/2021   Recent Labs    08/02/21 0824 08/16/21 0811 08/30/21 0822  NA 139 137 136  K 4.6 4.6 4.6  CL 101 101 102  CO2 30 27 28  GLUCOSE 95 102* 97  BUN 20 23 18  CREATININE 1.39* 1.25* 1.30*  CALCIUM 10.0 9.6 9.3  GFRNONAA 56* >60 >60  PROT 6.9 6.8 6.8  ALBUMIN 4.2 4.5 4.1  AST 18 15 16  ALT 19 30 21  ALKPHOS 66 69 66  BILITOT 0.9 1.0 0.7    Iron/TIBC/Ferritin/ %Sat    Component Value Date/Time   IRON 74 07/01/2018 0944   TIBC 250 07/01/2018 0944   FERRITIN 357 07/01/2018 0944   IRONPCTSAT 30 07/01/2018 0944    Lab Results  Component Value Date   TOTALPROTELP 6.4 08/16/2021   ALBUMINELP 4.0 03/08/2021   A1GS 0.2 03/08/2021   A2GS 0.7 03/08/2021   BETS 1.1 03/08/2021   GAMS 1.1 03/08/2021   MSPIKE Not Observed 03/08/2021   SPEI Comment 03/08/2021   Lab Results  Component Value Date   KPAFRELGTCHN 21.7 (H) 08/16/2021   LAMBDASER 11.1 08/16/2021   KAPLAMBRATIO 1.95 (H) 08/16/2021    RADIOGRAPHIC STUDIES: I have personally reviewed the radiological images as listed and agreed with the findings in the report. DG Cervical  Spine Complete  Result Date: 07/21/2021 CLINICAL DATA:  Multiple myeloma Neck pain for few weeks EXAM: CERVICAL SPINE - COMPLETE 4+ VIEW COMPARISON:  08/10/2019 FINDINGS: There is reversal of normal cervical lordosis with apex at C5. There is minimal retrolisthesis C5 on C6. No significant vertebral body height loss. Moderate disc height loss and endplate degenerative changes seen at C5-C6. Mild disc height loss and endplate spurring seen at C6-C7. Severe right neural foraminal stenosis at C3-C4. Mild right neural foraminal stenosis at C5-C6 and C6-C7. Moderate left neural foraminal stenosis at C4-C5 and C5-C6. Mild left neural foraminal stenosis at C3-C4 and C6-C7. is Facet degenerative changes seen throughout the cervical spine. Minimal atheromatous plaque noted in the region the carotid bulbs. IMPRESSION: Multilevel degenerative changes of the cervical spine as above. Electronically Signed   By: Farhaan  Mir M.D.   On: 07/21/2021 09:13   MR Lumbar Spine W Wo Contrast  Result Date: 06/19/2021 CLINICAL DATA:  Back pain, multiple myeloma EXAM: MRI LUMBAR SPINE WITHOUT AND WITH CONTRAST TECHNIQUE: Multiplanar and multiecho pulse sequences of the lumbar spine were obtained without and with intravenous contrast. CONTRAST:  9mL GADAVIST GADOBUTROL 1 MMOL/ML IV SOLN   COMPARISON:  None. FINDINGS: Segmentation:  Standard. Alignment:  No significant listhesis. Vertebrae: Vertebral body heights are maintained. No substantial marrow edema. No suspicious osseous lesion. Conus medullaris and cauda equina: Conus extends to the T12-L1 level. Conus and cauda equina appear normal. No abnormal intrathecal enhancement. Paraspinal and other soft tissues: Probable bilateral renal cysts partially imaged. Disc levels: L1-L2:  No canal or foraminal stenosis. L2-L3:  No canal or foraminal stenosis. L3-L4:  Disc bulge.  No canal or foraminal stenosis. L4-L5: Disc bulge. Central and left subarticular annular fissure. No canal or  foraminal stenosis. L5-S1: Disc bulge with endplate osteophytic ridging. Facet arthropathy. No canal stenosis. Moderate right and mild left foraminal stenosis. Potential exiting right L5 nerve root compression. IMPRESSION: No evidence of myelomatous involvement. No compression fracture. Mild lower lumbar degenerative changes. There is right foraminal narrowing at L5-S1 with potential exiting nerve root compression. Electronically Signed   By: Macy Mis M.D.   On: 06/19/2021 12:20   DG Bone Survey Met  Result Date: 08/17/2021 CLINICAL DATA:  Multiple myeloma, in relapse EXAM: METASTATIC BONE SURVEY COMPARISON:  08/07/2021 and previous FINDINGS: 5 mm lucency in the right scapula medial to the glenoid is stable since 08/10/2018. 11 mm lucency in the mid right humeral shaft stable. Ill-defined lucencies in the proximal right humeral shaft are more conspicuous than on prior study. Scattered degenerative changes including: Cervical degenerative disc disease C4-C7. Bilateral carotid bifurcation calcified plaque. Bilateral hip  DJD . Progressive degenerative disc disease in the lower thoracic spine, and L5-S1. IMPRESSION: 1. Stable small right humeral and scapular lucencies. 2. Ill-defined smaller lucencies in the proximal right humeral shaft more conspicuous than on prior study of 08/10/2018. 3. Chronic and degenerative changes as above. Electronically Signed   By: Lucrezia Europe M.D.   On: 08/17/2021 09:43   DG Foot Complete Right  Result Date: 08/07/2021 Please see detailed radiograph report in office note.     ASSESSMENT & PLAN:  1. Multiple myeloma not having achieved remission ([place])   2. Multiple myeloma in relapse ([place])   3. Encounter for antineoplastic chemotherapy   4. Thrombocytopenia ([place])   5. Bone lesion   6. Stage 3a chronic kidney disease (HCC)    #Light chain multiple myeloma beta 2 microglobulin 5 and normal albumin.  Stage II.cytogenetics showed normal male chromosome, MDS FISH panel  showed trisomy 45- Standard Risk.S/p RVD x 9 and  Autologous bone marrow stem cell transplant at [facility] on 04/23/2019.' Early relapse multiple myeloma. Labs are reviewed and discussed with patient. Proceed with cycle 4 Daratumumab.  Patient will start Pomalyst today.  Patient takes home supply of dexamethasone 20 mg weekly. Light chain ratio is responding nicely.  #Lucent lesions in the bone. Skeletal survey showed stable small right humeral and scapular lucencies.  Ill-defined small lucencies in the proximal right humeral shaft more conspicuous than on prior study of 08/10/2018. Majority of his lucencies were not hypermetabolic on previous PET scan Repeat another whole-body PET scan for evaluation. I will hold off Xgeva and to his foot fracture heals.  Plan resuming Xgeva approximately December 2022.  #Chronic thrombocytopenia, platelet count is stable.. # CKD, avoid nephrotoxin.  Creatinine fluctuates.  Encourage patient to increase oral hydration.   #Neck pain, previous Jan 2022 MRI cervical spine showed DJD.  Check X ray of cervical spine- DJD  physical therapy.  Flexeril 44m daily PRN  Post bone marrow transplant care He has completed 1 year of acyclovir.  Off acyclovir now. Post transplant  vaccination finished at [facility].    #Bone health/osteopenia/multiple myeloma-  Continue Xgeva monthly. Hold for now.  Continue calcium and vitamin D supplementation. Follow up 2 weeks for Lab MD Daratumumab treatment.   Earlie Server, MD, PhD 08/30/2021

## 2021-08-30 NOTE — Progress Notes (Signed)
Pt monitored 30 minutes post Darzalex injection per MD order. Pt tolerated injection well. Injection site WNL. Pt and VS stable at discharge.

## 2021-09-03 ENCOUNTER — Encounter: Payer: Medicare Other | Admitting: Physical Therapy

## 2021-09-04 ENCOUNTER — Other Ambulatory Visit: Payer: Self-pay | Admitting: Family Medicine

## 2021-09-10 ENCOUNTER — Encounter: Payer: Medicare Other | Admitting: Physical Therapy

## 2021-09-12 ENCOUNTER — Encounter: Payer: Medicare Other | Admitting: Physical Therapy

## 2021-09-13 ENCOUNTER — Encounter: Payer: Self-pay | Admitting: Oncology

## 2021-09-13 ENCOUNTER — Inpatient Hospital Stay (HOSPITAL_BASED_OUTPATIENT_CLINIC_OR_DEPARTMENT_OTHER): Payer: Medicare Other | Admitting: Oncology

## 2021-09-13 ENCOUNTER — Other Ambulatory Visit: Payer: Self-pay

## 2021-09-13 ENCOUNTER — Inpatient Hospital Stay: Payer: Medicare Other | Attending: Oncology

## 2021-09-13 ENCOUNTER — Telehealth: Payer: Self-pay

## 2021-09-13 ENCOUNTER — Inpatient Hospital Stay: Payer: Medicare Other

## 2021-09-13 VITALS — BP 132/82 | HR 68 | Temp 97.9°F | Wt 208.0 lb

## 2021-09-13 DIAGNOSIS — C9000 Multiple myeloma not having achieved remission: Secondary | ICD-10-CM

## 2021-09-13 DIAGNOSIS — I700 Atherosclerosis of aorta: Secondary | ICD-10-CM | POA: Diagnosis not present

## 2021-09-13 DIAGNOSIS — N1831 Chronic kidney disease, stage 3a: Secondary | ICD-10-CM | POA: Diagnosis not present

## 2021-09-13 DIAGNOSIS — Z8042 Family history of malignant neoplasm of prostate: Secondary | ICD-10-CM | POA: Diagnosis not present

## 2021-09-13 DIAGNOSIS — M8580 Other specified disorders of bone density and structure, unspecified site: Secondary | ICD-10-CM | POA: Insufficient documentation

## 2021-09-13 DIAGNOSIS — M899 Disorder of bone, unspecified: Secondary | ICD-10-CM

## 2021-09-13 DIAGNOSIS — Z5111 Encounter for antineoplastic chemotherapy: Secondary | ICD-10-CM

## 2021-09-13 DIAGNOSIS — Z800 Family history of malignant neoplasm of digestive organs: Secondary | ICD-10-CM | POA: Insufficient documentation

## 2021-09-13 DIAGNOSIS — Z5112 Encounter for antineoplastic immunotherapy: Secondary | ICD-10-CM | POA: Insufficient documentation

## 2021-09-13 DIAGNOSIS — D696 Thrombocytopenia, unspecified: Secondary | ICD-10-CM

## 2021-09-13 DIAGNOSIS — C9002 Multiple myeloma in relapse: Secondary | ICD-10-CM | POA: Insufficient documentation

## 2021-09-13 DIAGNOSIS — M1990 Unspecified osteoarthritis, unspecified site: Secondary | ICD-10-CM | POA: Insufficient documentation

## 2021-09-13 DIAGNOSIS — Z808 Family history of malignant neoplasm of other organs or systems: Secondary | ICD-10-CM

## 2021-09-13 DIAGNOSIS — R0602 Shortness of breath: Secondary | ICD-10-CM | POA: Insufficient documentation

## 2021-09-13 DIAGNOSIS — I129 Hypertensive chronic kidney disease with stage 1 through stage 4 chronic kidney disease, or unspecified chronic kidney disease: Secondary | ICD-10-CM | POA: Diagnosis not present

## 2021-09-13 DIAGNOSIS — I10 Essential (primary) hypertension: Secondary | ICD-10-CM

## 2021-09-13 LAB — CBC WITH DIFFERENTIAL/PLATELET
Abs Immature Granulocytes: 0.02 10*3/uL (ref 0.00–0.07)
Basophils Absolute: 0 10*3/uL (ref 0.0–0.1)
Basophils Relative: 0 %
Eosinophils Absolute: 0.3 10*3/uL (ref 0.0–0.5)
Eosinophils Relative: 8 %
HCT: 38.1 % — ABNORMAL LOW (ref 39.0–52.0)
Hemoglobin: 13.1 g/dL (ref 13.0–17.0)
Immature Granulocytes: 1 %
Lymphocytes Relative: 25 %
Lymphs Abs: 1 10*3/uL (ref 0.7–4.0)
MCH: 34.7 pg — ABNORMAL HIGH (ref 26.0–34.0)
MCHC: 34.4 g/dL (ref 30.0–36.0)
MCV: 101.1 fL — ABNORMAL HIGH (ref 80.0–100.0)
Monocytes Absolute: 0.7 10*3/uL (ref 0.1–1.0)
Monocytes Relative: 16 %
Neutro Abs: 2.1 10*3/uL (ref 1.7–7.7)
Neutrophils Relative %: 50 %
Platelets: 80 10*3/uL — ABNORMAL LOW (ref 150–400)
RBC: 3.77 MIL/uL — ABNORMAL LOW (ref 4.22–5.81)
RDW: 13.8 % (ref 11.5–15.5)
WBC: 4.1 10*3/uL (ref 4.0–10.5)
nRBC: 0 % (ref 0.0–0.2)

## 2021-09-13 LAB — COMPREHENSIVE METABOLIC PANEL
ALT: 22 U/L (ref 0–44)
AST: 17 U/L (ref 15–41)
Albumin: 4.3 g/dL (ref 3.5–5.0)
Alkaline Phosphatase: 79 U/L (ref 38–126)
Anion gap: 10 (ref 5–15)
BUN: 21 mg/dL (ref 8–23)
CO2: 24 mmol/L (ref 22–32)
Calcium: 9.5 mg/dL (ref 8.9–10.3)
Chloride: 102 mmol/L (ref 98–111)
Creatinine, Ser: 1.33 mg/dL — ABNORMAL HIGH (ref 0.61–1.24)
GFR, Estimated: 59 mL/min — ABNORMAL LOW (ref >60)
Glucose, Bld: 107 mg/dL — ABNORMAL HIGH (ref 70–99)
Potassium: 3.8 mmol/L (ref 3.5–5.1)
Sodium: 136 mmol/L (ref 135–145)
Total Bilirubin: 0.9 mg/dL (ref 0.3–1.2)
Total Protein: 6.8 g/dL (ref 6.5–8.1)

## 2021-09-13 MED ORDER — ACETAMINOPHEN 325 MG PO TABS
650.0000 mg | ORAL_TABLET | Freq: Once | ORAL | Status: AC
  Administered 2021-09-13: 650 mg via ORAL
  Filled 2021-09-13: qty 2

## 2021-09-13 MED ORDER — DIPHENHYDRAMINE HCL 25 MG PO CAPS
50.0000 mg | ORAL_CAPSULE | Freq: Once | ORAL | Status: AC
  Administered 2021-09-13: 50 mg via ORAL
  Filled 2021-09-13: qty 2

## 2021-09-13 MED ORDER — DARATUMUMAB-HYALURONIDASE-FIHJ 1800-30000 MG-UT/15ML ~~LOC~~ SOLN
1800.0000 mg | Freq: Once | SUBCUTANEOUS | Status: AC
  Administered 2021-09-13: 1800 mg via SUBCUTANEOUS
  Filled 2021-09-13: qty 15

## 2021-09-13 NOTE — Patient Instructions (Signed)
[facility] AT [place]  Discharge Instructions: Thank you for choosing [facility] to provide your oncology and hematology care.   If you have a lab appointment with the [facility], please go directly to the [facility] and check in at the registration area.   Wear comfortable clothing and clothing appropriate for easy access to any Portacath or PICC line.   We strive to give you quality time with your provider. You may need to reschedule your appointment if you arrive late (15 or more minutes).  Arriving late affects you and other patients whose appointments are after yours.  Also, if you miss three or more appointments without notifying the office, you may be dismissed from the clinic at the provider's discretion.      For prescription refill requests, have your pharmacy contact our office and allow 72 hours for refills to be completed.    Today you received the following chemotherapy and/or immunotherapy agents darzalex faspro      To help prevent nausea and vomiting after your treatment, we encourage you to take your nausea medication as directed.  BELOW ARE SYMPTOMS THAT SHOULD BE REPORTED IMMEDIATELY: *FEVER GREATER THAN 100.4 F (38 C) OR HIGHER *CHILLS OR SWEATING *NAUSEA AND VOMITING THAT IS NOT CONTROLLED WITH YOUR NAUSEA MEDICATION *UNUSUAL SHORTNESS OF BREATH *UNUSUAL BRUISING OR BLEEDING *URINARY PROBLEMS (pain or burning when urinating, or frequent urination) *BOWEL PROBLEMS (unusual diarrhea, constipation, pain near the anus) TENDERNESS IN MOUTH AND THROAT WITH OR WITHOUT PRESENCE OF ULCERS (sore throat, sores in mouth, or a toothache) UNUSUAL RASH, SWELLING OR PAIN  UNUSUAL VAGINAL DISCHARGE OR ITCHING   Items with * indicate a potential emergency and should be followed up as soon as possible or go to the Emergency Department if any problems should occur.  Please show the CHEMOTHERAPY ALERT CARD or IMMUNOTHERAPY ALERT CARD at  check-in to the Emergency Department and triage nurse.  Should you have questions after your visit or need to cancel or reschedule your appointment, please contact [facility] [place]  Dept: [phone]  and follow the prompts.  Office hours are 8:00 a.m. to 4:30 p.m. Monday - Friday. Please note that voicemails left after 4:00 p.m. may not be returned until the following business day.  We are closed weekends and major holidays. You have access to a nurse at all times for urgent questions. Please call the main number to the clinic Dept: [phone] and follow the prompts.   For any non-urgent questions, you may also contact your provider using MyChart. We now offer e-Visits for anyone 54 and older to request care online for non-urgent symptoms. For details visit [email or web address].   Also download the MyChart app! Go to the app store, search "MyChart", open the app, select [facility], and log in with your MyChart username and password.  Due to Covid, a mask is required upon entering the hospital/clinic. If you do not have a mask, one will be given to you upon arrival. For doctor visits, patients may have 1 support person aged 26 or older with them. For treatment visits, patients cannot have anyone with them due to current Covid guidelines and our immunocompromised population.   Daratumumab; Hyaluronidase Injection What is this medication? DARATUMUMAB; HYALURONIDASE (dar a toom ue mab / hye al ur ON i dase) is a monoclonal antibody. Hyaluronidase is used to improve the effects of daratumumab. It treats certain types of cancer. Some of the cancers treated are multiple myeloma  and light-chain amyloidosis. This medicine may be used for other purposes; ask your health care provider or pharmacist if you have questions. COMMON BRAND NAME(S): DARZALEX FASPRO What should I tell my care team before I take this medication? They need to know if you have any of these  conditions: heart disease infection especially a viral infection such as chickenpox, cold sores, herpes, or hepatitis B lung or breathing disease an unusual or allergic reaction to daratumumab, hyaluronidase, other medicines, foods, dyes, or preservatives pregnant or trying to get pregnant breast-feeding How should I use this medication? This medicine is for injection under the skin. It is given by a health care professional in a hospital or clinic setting. Talk to your pediatrician regarding the use of this medicine in children. Special care may be needed. Overdosage: If you think you have taken too much of this medicine contact a poison control center or emergency room at once. NOTE: This medicine is only for you. Do not share this medicine with others. What if I miss a dose? Keep appointments for follow-up doses as directed. It is important not to miss your dose. Call your doctor or health care professional if you are unable to keep an appointment. What may interact with this medication? Interactions have not been studied. This list may not describe all possible interactions. Give your health care provider a list of all the medicines, herbs, non-prescription drugs, or dietary supplements you use. Also tell them if you smoke, drink alcohol, or use illegal drugs. Some items may interact with your medicine. What should I watch for while using this medication? Your condition will be monitored carefully while you are receiving this medicine. This medicine can cause serious allergic reactions. To reduce your risk, your health care provider may give you other medicine to take before receiving this one. Be sure to follow the directions from your health care provider. This medicine can affect the results of blood tests to match your blood type. These changes can last for up to 6 months after the final dose. Your healthcare provider will do blood tests to match your blood type before you start  treatment. Tell all of your healthcare providers that you are being treated with this medicine before receiving a blood transfusion. This medicine can affect the results of some tests used to determine treatment response; extra tests may be needed to evaluate response. Do not become pregnant while taking this medicine or for 3 months after stopping it. Women should inform their health care provider if they wish to become pregnant or think they might be pregnant. There is a potential for serious side effects to an unborn child. Talk to your health care provider for more information. Do not breast-feed an infant while taking this medicine. What side effects may I notice from receiving this medication? Side effects that you should report to your care team as soon as possible: Allergic reactions--skin rash, itching or hives, swelling of the face, lips, or tongue Blood clot--chest pain, shortness of breath, pain, swelling or warmth in the leg Blurred vision Fast, irregular heartbeat Infection--fever, chills, cough, sore throat, pain or trouble passing urine Injection reactions--dizziness, fast heartbeat, feeling faint or lightheaded, falls, headache, increase in blood pressure, nausea, vomiting, or wheezing or trouble breathing with loud or whistling sounds Low red blood cell counts--trouble breathing, feeling faint, lightheaded or falls, unusually weak or tired Unusual bleeding or bruising Side effects that usually do not require medical attention (report these to your care team if they  continue or are bothersome): Back pain Constipation Diarrhea Pain, tingling, numbness in the hands or feet Pain, redness, or irritation at site where injected Muscle cramp or pain Swelling of the ankles, feet, hands Tiredness Trouble sleeping This list may not describe all possible side effects. Call your doctor for medical advice about side effects. You may report side effects to FDA at 1-800-FDA-1088. Where  should I keep my medication? This drug is given in a hospital or clinic and will not be stored at home. NOTE: This sheet is a summary. It may not cover all possible information. If you have questions about this medicine, talk to your doctor, pharmacist, or health care provider.  2022 [employer]/Gold Standard (2021-06-19 00:00:00)

## 2021-09-13 NOTE — Progress Notes (Signed)
Pt self administered tylenol, benadryl, and decadron pills 1 hour prior to injection.  Darzalex faspro administered to left lower abdominal quadrant, pt tolerated well, site benign.  Pt monitored for 30 minutes post injection with no problems or complaints noted.  Pt left infusion suite stable and ambulatory.

## 2021-09-13 NOTE — Progress Notes (Signed)
No xgeva today per MD, patient had foot fracture and waiting for him to recover

## 2021-09-13 NOTE — Telephone Encounter (Signed)
Please schedule PET- first available, and notify pt of appt.

## 2021-09-13 NOTE — Progress Notes (Signed)
Hematology/Oncology  Follow up note [phone] [phone]   Patient Care Team: Tonia Ghent, MD as PCP - General (Family Medicine) Earlie Server, MD as Consulting Physician (Oncology) Beather Arbour Lilyan Punt, MD as Referring Physician (Hematology and Oncology) Diannia Ruder, MD as Referring Physician (Hematology and Oncology)  REASON FOR VISIT Follow up for multiple myeloma.   HISTORY OF PRESENTING ILLNESS:  Luis Burke is a  65 y.o.  male with PMH listed below presents for follow up of multiple myeloma.  # 07/27/2018 multiple myeloma panel showed M protein of 0.1, IgG 662, IgA 49, IgM 7. Patient was called back to further labs done. 08/10/2018, free light chain ratio showed extremely high level of kappa free light chain 10,183, with a kappa lambda light chain ratio of 1414.31 LDH 164 Beta-2 microglobulin 5 Patient was called and discuss about results.  He was recommended to undergo bone marrow biopsy and PET scan. 08/19/2018 bone marrow biopsy showed hypercellular marrow 80%, involved by plasma cell neoplasm up to 95%.  Consistent with plasma cell myeloma. Myeloma FISH  gain of gain of CEP12  09/10/2018 skeletal survey showed questionable small lucent lesions within the midshaft of the humerus bilaterally.  Otherwise no suspicious focal lytic lesion or acute bone abnormality. 08/25/2018 PET scan showed no definite hypermetabolic bone disease but CT findings are highly suspicious for numerous small myelomatous lesions involving the spine, sternum and scattered ribs.  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 He received preparative regimen with melphalan 200 mg/m on 04/22/2019 followed by autologous stem cell infusion on 04/23/2019. Currently he is transfusion independent.  Transfusion criteria with as needed hemoglobin less than 7.5 for platelet less than 10,000.  Transplant course was complicated with febrile neutropenia grade 3,  treated with vancomycin and cephapirin until engraftment on 05/06/2019. Patient also had engraftment syndrome and had to be started on Solu-Medrol 25 mg twice daily on 05/05/2019.  Transition to Medrol Dosepak on 05/07/2019 to complete steroid taper as outpatient.  Patient is currently on acyclovir for 1 year after transplant, dose was reduced on 722 11/20/2018 due to renal function. Patient had a baseline CKD with creatinine running between 1.4-1.6.  He had acute on chronic kidney injury and creatinine went up to 2.2 on 722.  Fluid hydration was given and creatinine decreased to baseline 1.5-1.7. His Hickman catheter was removed.  Candida Cruris treated with topical clotrimazole 1% 3 times daily and to resolution..  #  seen by Dr. Tonia Brooms BMT team on 06/30/2019.revaccination at [place] begins at 12 months post transplant I discussed with [facility] hematology Dr.Choi and he recommends plans as listed below.  -patient to be started on Ixazomib maintenance: -Ixazomib 80m on D1/D8/D15 out of 28 day cycle.  -patient will start re-vaccination 12 months post transplant.  -Post transplant bone marrow biopsy if clinically indicated.  # seen by [facility] bone marrow transplant team in June 2021.  Patient has been started on immunization protocol.  He reports no new complaints.  #[facility]12[facility]  Lucent lesion in the L4 spinous process measuring 4 mm which is  nonspecific. Consider confirmation of marrow replacing process with MRI.  2.  Incompletely characterized renal cysts   # He continues to have chronic back pain and neck pain.  [facility], MRI lumbar spine with and without contrast showed no evidence of spinal metastasis.  Lumbar spondylosis is most pronounced at L5-S1 where there  is severe right and moderate left neural foraminal stenosis. 08/11/2020, x-ray cervical spine complete with flexion and extension showed The cervical spine is visualized from C1 to the  top of T1 on the lateral  view.  Reversal of the normal cervical lordosis with a focal kyphosis centered at the C5-C6 intervertebral level. Trace anterolisthesis of C4 onto C5. Trace retrolisthesis of C6 on C7. No evidence of dynamic instability is noted on  the flexion or extension views.   No prevertebral soft tissue swelling. Cervical vertebral body heights are maintained. Multilevel degenerative changes of the cervical spine including discogenic disease, most pronounced at C5-C6 and C6-C7, and facet arthropathy most pronounced at C4-C5. Multilevel mild to moderate left neural foraminal osseous narrowing of the mid to lower cervical vertebrae, possibly centrally/artifactual by patient positioning.  #07/28/2020 bone marrow normocellular marrow with polytypic plasmacytosis, 3 to 8%   Patient is in remission.  Cytogenetics negative for trisomy 12.  Negative myeloma FISH panel.   # Lumbar lesion,  11/13/20 [facility]  MRI cervical sine w/wo contrast - no evidence of metastatic disease C3-4 severe spinal stenosis, left neural foraminal narrowing. C4-5 spinal canal stenosis. [facility] MRI results showed no metastatic spine lesion. Chronic back pain and neck pain, images are consistent with chronic degenerative disease.  Patient continues to follow-up with [facility] orthopedic surgeon.  04/03/2021, light chain ratio increased to 3.76.   05/15/2021 bone marrow biopsy showed slightly hypercellular bone marrow for age with slight plasmacytosis.  5% plasma cells with kappa light chain restriction.  Cytogenetics normal.  Myeloma FISH negative # 05/15/2021 bone marrow biopsy showed slightly hypercellular bone marrow for age with slight plasmacytosis.  5% plasma cells with kappa light chain restriction.  Cytogenetics normal.  Myeloma FISH negative  # 05/31/2021 Daratumumab -Pomalyst-dexamethasone-D1 patient had grade 2 infusion reaction to daratumumab and received steroids, Benadryl, famotidine.  He was able to finish treatment.D8  was able to finish Daratumumab with no infusion reactions.  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   INTERVAL HISTORY Luis Burke is a 65 y.o. male who has above history reviewed by me today for follow-up  Currently on Daratumumab Pomalyst dexamethasone Patient has skeletal survey done during interval. He has a follow-up appointment with his podiatrist.  He currently wears brace.  Still some soreness.  No new complaints.     Review of Systems  Constitutional:  Negative for appetite change, chills, fatigue, fever and unexpected weight change.  HENT:   Negative for hearing loss and voice change.   Eyes:  Negative for eye problems and icterus.  Respiratory:  Negative for chest tightness, cough and shortness of breath.   Cardiovascular:  Negative for chest pain and leg swelling.  Gastrointestinal:  Negative for abdominal distention and abdominal pain.  Endocrine: Negative for hot flashes.  Genitourinary:  Negative for difficulty urinating, dysuria and frequency.   Musculoskeletal:  Positive for back pain and neck pain. Negative for arthralgias.  Skin:  Negative for itching and rash.  Neurological:  Negative for light-headedness and numbness.  Hematological:  Negative for adenopathy. Does not bruise/bleed easily.  Psychiatric/Behavioral:  Negative for confusion.    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   Fatty liver    on u/s 07/2018   Hyperlipidemia 03/2004   Hypertension 1994   Multiple myeloma ([place])  Snores     SURGICAL HISTORY: Past Surgical History:  Procedure Laterality Date   CATARACT EXTRACTION W/PHACO Right 03/04/2018   Procedure: CATARACT EXTRACTION PHACO AND INTRAOCULAR LENS PLACEMENT ([place])  RIGHT TORIC;  Surgeon: Leandrew Koyanagi, MD;  Location: [facility];  Service:  Ophthalmology;  Laterality: Right;  [place] no Toric Lens 1:45 5.3   COLONOSCOPY WITH PROPOFOL N/A 07/20/2018   Procedure: COLONOSCOPY WITH PROPOFOL;  Surgeon: Jonathon Bellows, MD;  Location: [facility] ENDOSCOPY;  Service: Gastroenterology;  Laterality: N/A;   ESOPHAGOGASTRODUODENOSCOPY (EGD) WITH PROPOFOL N/A 07/20/2018   Procedure: ESOPHAGOGASTRODUODENOSCOPY (EGD) WITH PROPOFOL;  Surgeon: Jonathon Bellows, MD;  Location: [facility] ENDOSCOPY;  Service: Gastroenterology;  Laterality: N/A;   EYE SURGERY Right    HERNIA REPAIR     L Lap herniorraphy  04/2000   Left wrist ganglionectomy      SOCIAL HISTORY: Social History   Socioeconomic History   Marital status: Single    Spouse name: Not on file   Number of children: 1   Years of education: Not on file   Highest education level: Not on file  Occupational History   Occupation: [employer]  Tobacco Use   Smoking status: Never   Smokeless tobacco: Never   Tobacco comments:    Occassionally  Vaping Use   Vaping Use: Never used  Substance and Sexual Activity   Alcohol use: Not Currently    Alcohol/week: 3.0 standard drinks    Types: 3 Cans of beer per week   Drug use: No   Sexual activity: Not on file  Other Topics Concern   Not on file  Social History Narrative   Divorced 2009, married Jun 06, 2021.     His daughter died 4 days after giving birth to the patient's granddaughter   Has joint custody of his deceased daughter's child   Worked at [employer] is [place], retired 2020.     Social Determinants of Health   Financial Resource Strain: Not on file  Food Insecurity: Not on file  Transportation Needs: Not on file  Physical Activity: Not on file  Stress: Not on file  Social Connections: Not on file  Intimate Partner Violence: Not on file    FAMILY HISTORY: Family History  Problem Relation Age of Onset   Hypertension Mother    Diabetes Mother    Heart disease Mother        CAD   Dementia Mother    Prostate cancer Father     Hypertension Father    Heart disease Father        MI 02/03   Colon cancer Father    Hypertension Sister    Hypertension Sister    Hypertension Sister     ALLERGIES:  is allergic to bactrim [sulfamethoxazole-trimethoprim], clonidine derivatives, and tadalafil.  MEDICATIONS:  Current Outpatient Medications  Medication Sig Dispense Refill   acetaminophen (TYLENOL) 325 MG tablet Take 2 tablets (650 mg total) by mouth See admin instructions. 1 hour prior to chemotherapy treatments 30 tablet 0   amLODipine (NORVASC) 5 MG tablet TAKE 2 TABLETS BY MOUTH EVERY DAY 180 tablet 1   ASPIRIN 81 PO Take 81 mg by mouth daily.     B Complex Vitamins (VITAMIN B COMPLEX PO) SMARTSIG:By Mouth     chlorhexidine (PERIDEX) 0.12 % solution Use as directed 15 mLs in the mouth or throat 2 (two) times daily. 473 mL 1   co-enzyme Q-10 50 MG capsule Take 50 mg by mouth daily.     [facility]  VITAMIN B12 1000 MCG tablet TAKE 1 TABLET BY MOUTH EVERY DAY 90 tablet 3   cyclobenzaprine (FLEXERIL) 5 MG tablet Take 1 tablet (5 mg total) by mouth daily as needed for muscle spasms. 30 tablet 0   dexamethasone (DECADRON) 4 MG tablet Take 5 tablets (20 mg total) by mouth once a week. Take at least 1 hour prior to infusion appt. Take with food. To start on 06/07/21. 20 tablet 3   diphenhydrAMINE (BENADRYL) 50 MG tablet Take 1 tablet (50 mg total) by mouth See admin instructions. Take 1 tablet 1 hour prior to chemotherapy treatments. 30 tablet 0   hydrALAZINE (APRESOLINE) 10 MG tablet TAKE 1 TABLET(10 MG) BY MOUTH THREE TIMES DAILY 270 tablet 1   lisinopril (ZESTRIL) 20 MG tablet Take 1 tablet (20 mg total) by mouth daily. Please keep appt on 07/05/21 90 tablet 0   loperamide (IMODIUM) 2 MG capsule Take 1 capsule (2 mg total) by mouth See admin instructions. Take 2 capsules at the onset of diarrhea, then 1 capsule every 2 hours or after every loose bowel movement. Maximum 8 capsules per 24 hours. 60 capsule 0   LORazepam (ATIVAN) 1 MG  tablet TAKE 1 TABLET (1 MG TOTAL) BY MOUTH EVERY 6 (SIX) HOURS AS NEEDED FOR ANXIETY (FOR NAUSEA)     magnesium chloride (SLOW-MAG) 64 MG TBEC SR tablet Take 1 tablet (64 mg total) by mouth daily. 60 tablet 0   metoprolol succinate (TOPROL-XL) 50 MG 24 hr tablet TAKE 4 TABLETS (200 MG TOTAL) BY MOUTH DAILY. TAKE WITH OR IMMEDIATELY FOLLOWING A MEAL. 360 tablet 2   montelukast (SINGULAIR) 10 MG tablet Take 1 tablet (10 mg total) by mouth See admin instructions. Take 1day prior to chemotherapy 30 tablet 1   ondansetron (ZOFRAN) 8 MG tablet Take 1 tablet (8 mg total) by mouth every 8 (eight) hours as needed. for nausea 30 tablet 1   pomalidomide (POMALYST) 2 MG capsule Take 1 capsule (2 mg total) by mouth daily. Take for 21 days on, then hold for 7 days. Repeat every 28 days. 21 capsule 0   pravastatin (PRAVACHOL) 10 MG tablet TAKE 1 TABLET BY MOUTH EVERY DAY 90 tablet 3   prochlorperazine (COMPAZINE) 10 MG tablet      sildenafil (REVATIO) 20 MG tablet Take 1-5 tablets (20-100 mg total) by mouth daily as needed. 50 tablet 12   zinc gluconate 50 MG tablet Take 50 mg by mouth daily.     No current facility-administered medications for this visit.     PHYSICAL EXAMINATION: ECOG PERFORMANCE STATUS: 1 - Symptomatic but completely ambulatory Vitals:   09/13/21 0829  BP: 132/82  Pulse: 68  Temp: 97.9 F (36.6 C)   Filed Weights   09/13/21 0829  Weight: 208 lb (94.3 kg)    Physical Exam Constitutional:      General: He is not in acute distress. HENT:     Head: Normocephalic and atraumatic.  Eyes:     General: No scleral icterus. Cardiovascular:     Rate and Rhythm: Normal rate and regular rhythm.     Heart sounds: Normal heart sounds.  Pulmonary:     Effort: Pulmonary effort is normal. No respiratory distress.     Breath sounds: No wheezing.  Abdominal:     General: Bowel sounds are normal. There is no distension.     Palpations: Abdomen is soft.  Musculoskeletal:        General:  No deformity. Normal range of motion.  Cervical back: Normal range of motion and neck supple.  Skin:    General: Skin is warm and dry.     Findings: No erythema or rash.  Neurological:     Mental Status: He is alert and oriented to person, place, and time. Mental status is at baseline.     Cranial Nerves: No cranial nerve deficit.     Coordination: Coordination normal.  Psychiatric:        Mood and Affect: Mood normal.     LABORATORY DATA:  I have reviewed the data as listed Lab Results  Component Value Date   WBC 4.1 09/13/2021   HGB 13.1 09/13/2021   HCT 38.1 (L) 09/13/2021   MCV 101.1 (H) 09/13/2021   PLT 80 (L) 09/13/2021   Recent Labs    08/16/21 0811 08/30/21 0822 09/13/21 0716  NA 137 136 136  K 4.6 4.6 3.8  CL 101 102 102  CO2 _0 GLUCOSE 102* 97 107*  BUN _1 CREATININE 1.25* 1.30* 1.33*  CALCIUM 9.6 9.3 9.5  GFRNONAA >60 >60 59*  PROT 6.8 6.8 6.8  ALBUMIN 4.5 4.1 4.3  AST _2 ALT _3 ALKPHOS 69 66 79  BILITOT 1.0 0.7 0.9    Iron/TIBC/Ferritin/ %Sat    Component Value Date/Time   IRON 74 07/01/2018 0944   TIBC 250 07/01/2018 0944   FERRITIN 357 07/01/2018 0944   IRONPCTSAT 30 07/01/2018 0944    Lab Results  Component Value Date   TOTALPROTELP 6.4 08/16/2021   ALBUMINELP 4.0 03/08/2021   A1GS 0.2 03/08/2021   A2GS 0.7 03/08/2021   BETS 1.1 03/08/2021   GAMS 1.1 03/08/2021   MSPIKE Not Observed 03/08/2021   SPEI Comment 03/08/2021   Lab Results  Component Value Date   KPAFRELGTCHN 21.7 (H) 08/16/2021   LAMBDASER 11.1 08/16/2021   KAPLAMBRATIO 1.95 (H) 08/16/2021    RADIOGRAPHIC STUDIES: I have personally reviewed the radiological images as listed and agreed with the findings in the report. DG Cervical Spine Complete  Result Date: 07/21/2021 CLINICAL DATA:  Multiple myeloma Neck pain for few weeks EXAM: CERVICAL SPINE - COMPLETE 4+ VIEW COMPARISON:  08/10/2019 FINDINGS: There is reversal of normal cervical  lordosis with apex at C5. There is minimal retrolisthesis C5 on C6. No significant vertebral body height loss. Moderate disc height loss and endplate degenerative changes seen at C5-C6. Mild disc height loss and endplate spurring seen at C6-C7. Severe right neural foraminal stenosis at C3-C4. Mild right neural foraminal stenosis at C5-C6 and C6-C7. Moderate left neural foraminal stenosis at C4-C5 and C5-C6. Mild left neural foraminal stenosis at C3-C4 and C6-C7. is Facet degenerative changes seen throughout the cervical spine. Minimal atheromatous plaque noted in the region the carotid bulbs. IMPRESSION: Multilevel degenerative changes of the cervical spine as above. Electronically Signed   By: Miachel Roux M.D.   On: 07/21/2021 09:13   MR Lumbar Spine W Wo Contrast  Result Date: 06/19/2021 CLINICAL DATA:  Back pain, multiple myeloma EXAM: MRI LUMBAR SPINE WITHOUT AND WITH CONTRAST TECHNIQUE: Multiplanar and multiecho pulse sequences of the lumbar spine were obtained without and with intravenous contrast. CONTRAST:  68m GADAVIST GADOBUTROL 1 MMOL/ML IV SOLN COMPARISON:  None. FINDINGS: Segmentation:  Standard. Alignment:  No significant listhesis. Vertebrae: Vertebral body heights are maintained. No substantial marrow edema. No suspicious osseous lesion. Conus medullaris and cauda equina: Conus extends to the T12-L1 level. Conus and cauda equina appear normal. No  abnormal intrathecal enhancement. Paraspinal and other soft tissues: Probable bilateral renal cysts partially imaged. Disc levels: L1-L2:  No canal or foraminal stenosis. L2-L3:  No canal or foraminal stenosis. L3-L4:  Disc bulge.  No canal or foraminal stenosis. L4-L5: Disc bulge. Central and left subarticular annular fissure. No canal or foraminal stenosis. L5-S1: Disc bulge with endplate osteophytic ridging. Facet arthropathy. No canal stenosis. Moderate right and mild left foraminal stenosis. Potential exiting right L5 nerve root compression.  IMPRESSION: No evidence of myelomatous involvement. No compression fracture. Mild lower lumbar degenerative changes. There is right foraminal narrowing at L5-S1 with potential exiting nerve root compression. Electronically Signed   By: Macy Mis M.D.   On: 06/19/2021 12:20   DG Bone Survey Met  Result Date: 08/17/2021 CLINICAL DATA:  Multiple myeloma, in relapse EXAM: METASTATIC BONE SURVEY COMPARISON:  08/07/2021 and previous FINDINGS: 5 mm lucency in the right scapula medial to the glenoid is stable since 08/10/2018. 11 mm lucency in the mid right humeral shaft stable. Ill-defined lucencies in the proximal right humeral shaft are more conspicuous than on prior study. Scattered degenerative changes including: Cervical degenerative disc disease C4-C7. Bilateral carotid bifurcation calcified plaque. Bilateral hip  DJD . Progressive degenerative disc disease in the lower thoracic spine, and L5-S1. IMPRESSION: 1. Stable small right humeral and scapular lucencies. 2. Ill-defined smaller lucencies in the proximal right humeral shaft more conspicuous than on prior study of 08/10/2018. 3. Chronic and degenerative changes as above. Electronically Signed   By: Lucrezia Europe M.D.   On: 08/17/2021 09:43   DG Foot Complete Right  Result Date: 08/07/2021 Please see detailed radiograph report in office note.     ASSESSMENT & PLAN:  1. Multiple myeloma not having achieved remission ([place])   2. Encounter for antineoplastic chemotherapy   3. Thrombocytopenia ([place])   4. Bone lesion    #Light chain multiple myeloma beta 2 microglobulin 5 and normal albumin.  Stage II.cytogenetics showed normal male chromosome, MDS FISH panel showed trisomy 69- Standard Risk.S/p RVD x 9 and  Autologous bone marrow stem [facility] at [facility] on 04[facility]23/2019.' Early relapse multiple myeloma. Labs are reviewed and discussed with patient. Proceed with cycle 4 D15 daratumumab  Finish current course of pomalyst.  He takes  Dexamethasone 60m weekly.    #Lucent lesions in the bone. Skeletal survey showed stable small right humeral and scapular lucencies.  Ill-defined small lucencies in the proximal right humeral shaft more conspicuous than on prior study of 08/10/2018. Majority of his lucencies were not hypermetabolic on previous PET scan Repeat another whole-body PET scan for evaluation. I will hold off Xgeva and to his foot fracture heals.  Plan resuming Xgeva approximately December 2022.  #Chronic thrombocytopenia, platelet count is stable.. # CKD, avoid nephrotoxin.  Creatinine fluctuates.  Encourage patient to increase oral hydration.   #Neck pain, previous Jan 2022 MRI cervical spine showed DJD.  Check X ray of cervical spine- DJD  physical therapy.  Flexeril 532mdaily PRN  Post bone marrow transplant care He has completed 1 year of acyclovir.  Off acyclovir now. [facility] vaccination [facility]   #Bone health/osteopenia/multiple myeloma-  Continue Xgeva monthly. Hold for now.  Continue calcium and vitamin D supplementation. Follow up 2 weeks for Lab MD Daratumumab treatment.   ZhEarlie ServerMD, PhD 09/13/2021

## 2021-09-14 ENCOUNTER — Encounter: Payer: Self-pay | Admitting: Oncology

## 2021-09-14 LAB — KAPPA/LAMBDA LIGHT CHAINS
Kappa free light chain: 22.4 mg/L — ABNORMAL HIGH (ref 3.3–19.4)
Kappa, lambda light chain ratio: 1.96 — ABNORMAL HIGH (ref 0.26–1.65)
Lambda free light chains: 11.4 mg/L (ref 5.7–26.3)

## 2021-09-18 ENCOUNTER — Ambulatory Visit (INDEPENDENT_AMBULATORY_CARE_PROVIDER_SITE_OTHER): Payer: Medicare Other | Admitting: Podiatry

## 2021-09-18 ENCOUNTER — Other Ambulatory Visit: Payer: Self-pay

## 2021-09-18 ENCOUNTER — Ambulatory Visit: Payer: Medicare Other

## 2021-09-18 DIAGNOSIS — M84374A Stress fracture, right foot, initial encounter for fracture: Secondary | ICD-10-CM

## 2021-09-18 DIAGNOSIS — C9000 Multiple myeloma not having achieved remission: Secondary | ICD-10-CM

## 2021-09-18 LAB — MULTIPLE MYELOMA PANEL, SERUM
Albumin SerPl Elph-Mcnc: 3.6 g/dL (ref 2.9–4.4)
Albumin/Glob SerPl: 1.6 (ref 0.7–1.7)
Alpha 1: 0.2 g/dL (ref 0.0–0.4)
Alpha2 Glob SerPl Elph-Mcnc: 0.7 g/dL (ref 0.4–1.0)
B-Globulin SerPl Elph-Mcnc: 1 g/dL (ref 0.7–1.3)
Gamma Glob SerPl Elph-Mcnc: 0.4 g/dL (ref 0.4–1.8)
Globulin, Total: 2.4 g/dL (ref 2.2–3.9)
IgA: 83 mg/dL (ref 61–437)
IgG (Immunoglobin G), Serum: 513 mg/dL — ABNORMAL LOW (ref 603–1613)
IgM (Immunoglobulin M), Srm: 9 mg/dL — ABNORMAL LOW (ref 20–172)
M Protein SerPl Elph-Mcnc: 0.1 g/dL — ABNORMAL HIGH
Total Protein ELP: 6 g/dL (ref 6.0–8.5)

## 2021-09-18 MED ORDER — POMALIDOMIDE 2 MG PO CAPS: 2.0000 mg | ORAL_CAPSULE | Freq: Every day | ORAL | 0 refills | Status: DC

## 2021-09-18 NOTE — Progress Notes (Signed)
   HPI: 65 y.o. male presenting today for follow-up evaluation of a stress reaction fracture to the second metatarsal of the right foot.  Patient states that he has been wearing the cam boot as instructed.  There has been some good improvement of the pain over the last several weeks.  He presents for follow-up treatment evaluation  Past Medical History:  Diagnosis Date   AKI (acute kidney injury) ([place]) 01/06/2019   Anemia    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[place])    autologous stem cell bone marrow transplant on 04/23/2019.   Fatty liver    on u/s 07/2018   Hyperlipidemia 03/2004   Hypertension 1994   Multiple myeloma ([place])    Snores      Physical Exam: General: The patient is alert and oriented x3 in no acute distress.  Dermatology: Skin is warm, dry and supple bilateral lower extremities. Negative for open lesions or macerations.  Hyperkeratotic dystrophic nail noted to the left hallux.  Asymptomatic.  Vascular: Palpable pedal pulses bilaterally. No edema or erythema noted. Capillary refill within normal limits.  Neurological: Epicritic and protective threshold grossly intact bilaterally.   Musculoskeletal Exam: No pedal deformities noted improved tenderness to palpation along the second metatarsal of the right foot  Radiographic Exam:  Normal osseous mineralization. Joint spaces preserved.  Subtle stress reaction fracture noted to the second metatarsal of the right foot with periosteal callus formation.  The fracture appears to be healing routinely  Assessment: 1.  Stress reaction fracture second metatarsal right  Plan of Care:  1. Patient evaluated. X-Rays reviewed today and compared with x-rays taken last visit.  2.  Patient may begin to transition out of the cam boot into good supportive sneakers.  If he is going to be on his feet for prolonged period of time recommend the cam boot 3.  Compression ankle sleeve dispensed.  Wear daily 4.  Return to clinic in 6 weeks for follow-up  x-ray      Edrick Kins, DPM [facility]  Dr. Edrick Kins, DPM    [address], [place] [address]                Office [phone]  Fax [phone]

## 2021-09-20 ENCOUNTER — Other Ambulatory Visit: Payer: Self-pay

## 2021-09-20 ENCOUNTER — Ambulatory Visit: Payer: Medicare Other | Admitting: Oncology

## 2021-09-20 ENCOUNTER — Ambulatory Visit: Payer: Medicare Other

## 2021-09-20 ENCOUNTER — Ambulatory Visit
Admission: RE | Admit: 2021-09-20 | Discharge: 2021-09-20 | Disposition: A | Discharge status: Home / Self Care | Payer: Medicare Other | Source: Ambulatory Visit | Attending: Oncology | Admitting: Oncology

## 2021-09-20 ENCOUNTER — Encounter: Payer: Self-pay | Admitting: Oncology

## 2021-09-20 ENCOUNTER — Other Ambulatory Visit: Payer: Medicare Other

## 2021-09-20 DIAGNOSIS — C9000 Multiple myeloma not having achieved remission: Secondary | ICD-10-CM | POA: Diagnosis not present

## 2021-09-20 DIAGNOSIS — I700 Atherosclerosis of aorta: Secondary | ICD-10-CM | POA: Insufficient documentation

## 2021-09-20 LAB — GLUCOSE, CAPILLARY: Glucose-Capillary: 138 mg/dL — ABNORMAL HIGH (ref 70–99)

## 2021-09-20 IMAGING — CT NM PET IMAGE RESTAGE (PS) WHOLE BODY
1 of 9 series · 3 of 25 positions shown · non-contrast
Comparison: [DATE]

CLINICAL DATA: Subsequent treatment strategy for multiple myeloma.

EXAM:
NUCLEAR MEDICINE PET WHOLE BODY
TECHNIQUE: 11.3 mCi F-18 FDG was injected intravenously. Full-ring PET imaging
was performed from the head to foot after the radiotracer. CT data
was obtained and used for attenuation correction and anatomic
localization.
Fasting blood glucose: 138 mg/dl

[Series 3: ct wb 5.0 b30f · axial · 5.0mm · 0.98mm/px · z∈[-1316,-410]mm · 3 of 604 slices shown]
[im 151/604  soft-tissue]
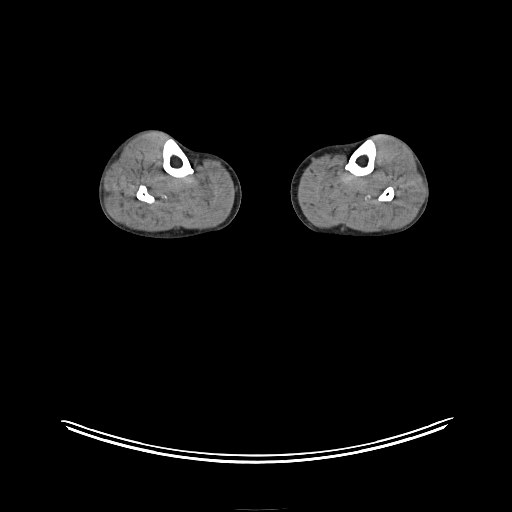
[im 302/604  soft-tissue]
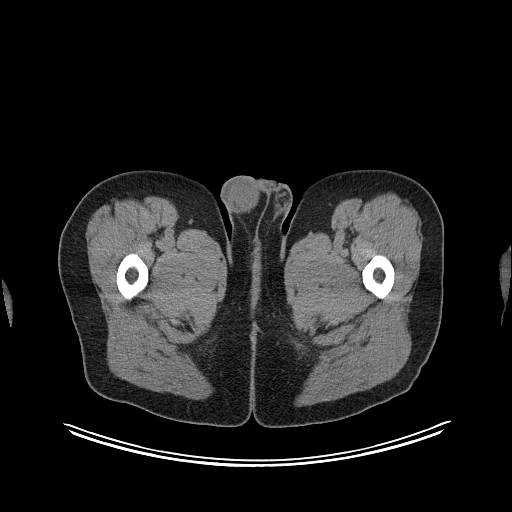
[im 453/604  soft-tissue]
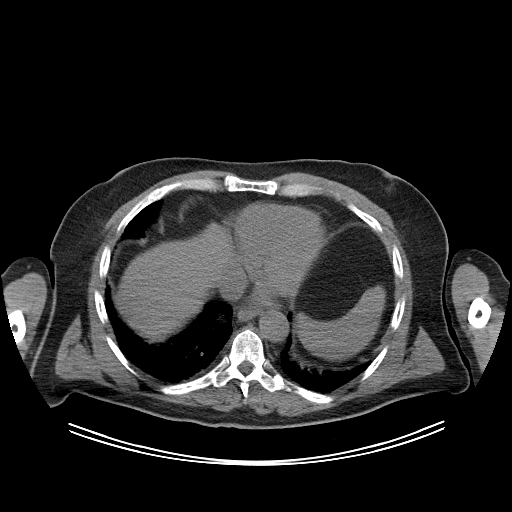

[3 of 25 positions shown; findings below may reference images not displayed]

FINDINGS: Mediastinal blood pool activity: SUV max

HEAD/NECK: No hypermetabolic activity in the scalp. No
hypermetabolic cervical lymph nodes.

Incidental CT findings: none

CHEST: No hypermetabolic mediastinal or hilar nodes. No suspicious
pulmonary nodules on the CT scan.

Incidental CT findings: Mild atherosclerotic calcification is noted
in the wall of the thoracic aorta. Dependent atelectasis noted in
the lung bases.

ABDOMEN/PELVIS: No abnormal hypermetabolic activity within the
liver, pancreas, adrenal glands, or spleen. No hypermetabolic lymph
nodes in the abdomen or pelvis.

FDG accumulation in the region of the left groin herniorrhaphy has
decreased in the interval with SUV max = 5.6 today compared to
previously. These findings are likely related to postsurgical
change.

Incidental CT findings: Bilateral renal cysts are again noted.
cm interpolar left renal cyst has increased in size from 3.4 cm
previously but appears photopenic on PET imaging. There is mild
atherosclerotic calcification of the abdominal aorta without
aneurysm.

SKELETON: No focal hypermetabolic activity to suggest skeletal
metastasis.

Incidental CT findings: The scattered tiny lucent bone lesion seen
on the previous PET-CT involving the sternum, right humeral head,
and spine are less prominent today and appear resolved in most
locations. No suspicious bony hypermetabolism on today's study.
Interval continued further healing of the anterior left second rib
fracture identified previously.

EXTREMITIES: No abnormal hypermetabolic activity in the lower
extremities.

Incidental CT findings: none
IMPRESSION: 1. No evidence for suspicious or new hypermetabolism on today's
study.
2. The scattered small lucent foci seen previously in the sternum
and spine are less prominent today and have resolved in some
regions.
3.  Aortic Atherosclerois ([S9]-170.0)

## 2021-09-20 MED ORDER — FLUDEOXYGLUCOSE F - 18 (FDG) INJECTION
11.2900 | Freq: Once | INTRAVENOUS | Status: AC | PRN
  Administered 2021-09-20: 11.29 via INTRAVENOUS

## 2021-09-27 ENCOUNTER — Inpatient Hospital Stay (HOSPITAL_BASED_OUTPATIENT_CLINIC_OR_DEPARTMENT_OTHER): Payer: Medicare Other | Admitting: Oncology

## 2021-09-27 ENCOUNTER — Inpatient Hospital Stay: Payer: Medicare Other

## 2021-09-27 ENCOUNTER — Other Ambulatory Visit: Payer: Self-pay

## 2021-09-27 ENCOUNTER — Encounter: Payer: Self-pay | Admitting: Oncology

## 2021-09-27 VITALS — BP 161/92 | HR 65 | Temp 97.3°F | Wt 211.0 lb

## 2021-09-27 DIAGNOSIS — M899 Disorder of bone, unspecified: Secondary | ICD-10-CM

## 2021-09-27 DIAGNOSIS — N179 Acute kidney failure, unspecified: Secondary | ICD-10-CM

## 2021-09-27 DIAGNOSIS — Z5112 Encounter for antineoplastic immunotherapy: Secondary | ICD-10-CM | POA: Diagnosis not present

## 2021-09-27 DIAGNOSIS — Z5111 Encounter for antineoplastic chemotherapy: Secondary | ICD-10-CM

## 2021-09-27 DIAGNOSIS — C9002 Multiple myeloma in relapse: Secondary | ICD-10-CM

## 2021-09-27 DIAGNOSIS — C9000 Multiple myeloma not having achieved remission: Secondary | ICD-10-CM

## 2021-09-27 DIAGNOSIS — D696 Thrombocytopenia, unspecified: Secondary | ICD-10-CM | POA: Diagnosis not present

## 2021-09-27 DIAGNOSIS — N1831 Chronic kidney disease, stage 3a: Secondary | ICD-10-CM

## 2021-09-27 LAB — COMPREHENSIVE METABOLIC PANEL
ALT: 18 U/L (ref 0–44)
AST: 15 U/L (ref 15–41)
Albumin: 4.3 g/dL (ref 3.5–5.0)
Alkaline Phosphatase: 63 U/L (ref 38–126)
Anion gap: 10 (ref 5–15)
BUN: 19 mg/dL (ref 8–23)
CO2: 27 mmol/L (ref 22–32)
Calcium: 9.8 mg/dL (ref 8.9–10.3)
Chloride: 101 mmol/L (ref 98–111)
Creatinine, Ser: 1.32 mg/dL — ABNORMAL HIGH (ref 0.61–1.24)
GFR, Estimated: 60 mL/min — ABNORMAL LOW (ref >60)
Glucose, Bld: 99 mg/dL (ref 70–99)
Potassium: 4.5 mmol/L (ref 3.5–5.1)
Sodium: 138 mmol/L (ref 135–145)
Total Bilirubin: 0.7 mg/dL (ref 0.3–1.2)
Total Protein: 6.7 g/dL (ref 6.5–8.1)

## 2021-09-27 LAB — CBC WITH DIFFERENTIAL/PLATELET
Abs Immature Granulocytes: 0.02 10*3/uL (ref 0.00–0.07)
Basophils Absolute: 0 10*3/uL (ref 0.0–0.1)
Basophils Relative: 1 %
Eosinophils Absolute: 0.1 10*3/uL (ref 0.0–0.5)
Eosinophils Relative: 3 %
HCT: 36.1 % — ABNORMAL LOW (ref 39.0–52.0)
Hemoglobin: 12.1 g/dL — ABNORMAL LOW (ref 13.0–17.0)
Immature Granulocytes: 1 %
Lymphocytes Relative: 39 %
Lymphs Abs: 1.7 10*3/uL (ref 0.7–4.0)
MCH: 34.2 pg — ABNORMAL HIGH (ref 26.0–34.0)
MCHC: 33.5 g/dL (ref 30.0–36.0)
MCV: 102 fL — ABNORMAL HIGH (ref 80.0–100.0)
Monocytes Absolute: 0.8 10*3/uL (ref 0.1–1.0)
Monocytes Relative: 19 %
Neutro Abs: 1.6 10*3/uL — ABNORMAL LOW (ref 1.7–7.7)
Neutrophils Relative %: 37 %
Platelets: 130 10*3/uL — ABNORMAL LOW (ref 150–400)
RBC: 3.54 MIL/uL — ABNORMAL LOW (ref 4.22–5.81)
RDW: 14.2 % (ref 11.5–15.5)
WBC: 4.2 10*3/uL (ref 4.0–10.5)
nRBC: 0 % (ref 0.0–0.2)

## 2021-09-27 MED ORDER — DARATUMUMAB-HYALURONIDASE-FIHJ 1800-30000 MG-UT/15ML ~~LOC~~ SOLN
1800.0000 mg | Freq: Once | SUBCUTANEOUS | Status: AC
  Administered 2021-09-27: 1800 mg via SUBCUTANEOUS
  Filled 2021-09-27: qty 15

## 2021-09-27 MED ORDER — DIPHENHYDRAMINE HCL 25 MG PO CAPS
50.0000 mg | ORAL_CAPSULE | Freq: Once | ORAL | Status: AC
  Administered 2021-09-27: 50 mg via ORAL
  Filled 2021-09-27: qty 2

## 2021-09-27 MED ORDER — DENOSUMAB 120 MG/1.7ML ~~LOC~~ SOLN
120.0000 mg | Freq: Once | SUBCUTANEOUS | Status: AC
  Administered 2021-09-27: 120 mg via SUBCUTANEOUS
  Filled 2021-09-27: qty 1.7

## 2021-09-27 MED ORDER — ACETAMINOPHEN 325 MG PO TABS
650.0000 mg | ORAL_TABLET | Freq: Once | ORAL | Status: AC
  Administered 2021-09-27: 650 mg via ORAL
  Filled 2021-09-27: qty 2

## 2021-09-27 MED ORDER — SODIUM CHLORIDE 0.9 % IV SOLN
Freq: Once | INTRAVENOUS | Status: DC
  Filled 2021-09-27: qty 250

## 2021-09-27 NOTE — Progress Notes (Signed)
Hematology/Oncology  Follow up note [phone] [phone]   Patient Care Team: Tonia Ghent, MD as PCP - General (Family Medicine) Earlie Server, MD as Consulting Physician (Oncology) Beather Arbour Lilyan Punt, MD as Referring Physician (Hematology and Oncology) Diannia Ruder, MD as Referring Physician (Hematology and Oncology)  REASON FOR VISIT Follow up for multiple myeloma.   HISTORY OF PRESENTING ILLNESS:  Luis Burke is a  65 y.o.  male with PMH listed below presents for follow up of multiple myeloma.  # 07/27/2018 multiple myeloma panel showed M protein of 0.1, IgG 662, IgA 49, IgM 7. Patient was called back to further labs done. 08/10/2018, free light chain ratio showed extremely high level of kappa free light chain 10,183, with a kappa lambda light chain ratio of 1414.31 LDH 164 Beta-2 microglobulin 5 Patient was called and discuss about results.  He was recommended to undergo bone marrow biopsy and PET scan. 08/19/2018 bone marrow biopsy showed hypercellular marrow 80%, involved by plasma cell neoplasm up to 95%.  Consistent with plasma cell myeloma. Myeloma FISH  gain of gain of CEP12  09/10/2018 skeletal survey showed questionable small lucent lesions within the midshaft of the humerus bilaterally.  Otherwise no suspicious focal lytic lesion or acute bone abnormality. 08/25/2018 PET scan showed no definite hypermetabolic bone disease but CT findings are highly suspicious for numerous small myelomatous lesions involving the spine, sternum and scattered ribs.  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 He received preparative regimen with melphalan 200 mg/m on 04/22/2019 followed by autologous stem cell infusion on 04/23/2019. Currently he is transfusion independent.  Transfusion criteria with as needed hemoglobin less than 7.5 for platelet less than 10,000.  Transplant course was complicated with febrile neutropenia grade 3,  treated with vancomycin and cephapirin until engraftment on 05/06/2019. Patient also had engraftment syndrome and had to be started on Solu-Medrol 25 mg twice daily on 05/05/2019.  Transition to Medrol Dosepak on 05/07/2019 to complete steroid taper as outpatient.  Patient is currently on acyclovir for 1 year after transplant, dose was reduced on 722 11/20/2018 due to renal function. Patient had a baseline CKD with creatinine running between 1.4-1.6.  He had acute on chronic kidney injury and creatinine went up to 2.2 on 722.  Fluid hydration was given and creatinine decreased to baseline 1.5-1.7. His Hickman catheter was removed.  Candida Cruris treated with topical clotrimazole 1% 3 times daily and to resolution..  #  seen by Dr. Tonia Brooms BMT team on 06/30/2019.revaccination at [place] begins at 12 months post transplant I discussed with [facility] hematology Dr.Choi and he recommends plans as listed below.  -patient to be started on Ixazomib maintenance: -Ixazomib 58m on D1/D8/D15 out of 28 day cycle.  -patient will start re-vaccination 12 months post transplant.  -Post transplant bone marrow biopsy if clinically indicated.  # seen by [facility] bone marrow transplant team in June 2021.  Patient has been started on immunization protocol.  He reports no new complaints.  #[facility]  Lucent lesion in the L4 spinous process measuring 4 mm which is  nonspecific. Consider confirmation of marrow replacing process with MRI.  2.  Incompletely characterized renal cysts   # He continues to have chronic back pain and neck pain.  [facility], MRI lumbar spine with and without contrast showed no evidence of spinal metastasis.  Lumbar spondylosis is most pronounced at L5-S1 where  is severe right and moderate left neural foraminal stenosis. 08/11/2020, x-ray cervical spine complete with flexion and extension showed The cervical spine is visualized from C1 to the  top of T1 on the lateral  view.  Reversal of the normal cervical lordosis with a focal kyphosis centered at the C5-C6 intervertebral level. Trace anterolisthesis of C4 onto C5. Trace retrolisthesis of C6 on C7. No evidence of dynamic instability is noted on  the flexion or extension views.   No prevertebral soft tissue swelling. Cervical vertebral body heights are maintained. Multilevel degenerative changes of the cervical spine including discogenic disease, most pronounced at C5-C6 and C6-C7, and facet arthropathy most pronounced at C4-C5. Multilevel mild to moderate left neural foraminal osseous narrowing of the mid to lower cervical vertebrae, possibly centrally/artifactual by patient positioning.  #07/28/2020 bone marrow normocellular marrow with polytypic plasmacytosis, 3 to 8%   Patient is in remission.  Cytogenetics negative for trisomy 12.  Negative myeloma FISH panel.   # Lumbar lesion,  11/13/20 [facility]  MRI cervical sine w/wo contrast - no evidence of metastatic disease C3-4 severe spinal stenosis, left neural foraminal narrowing. C4-5 spinal canal stenosis. [facility] MRI results showed no metastatic spine lesion. Chronic back pain and neck pain, images are consistent with chronic degenerative disease.  Patient continues to follow-up with [place] orthopedic surgeon.  04/03/2021, light chain ratio increased to 3.76.   05/15/2021 bone marrow biopsy showed slightly hypercellular bone marrow for age with slight plasmacytosis.  5% plasma cells with kappa light chain restriction.  Cytogenetics normal.  Myeloma FISH negative # 05/15/2021 bone marrow biopsy showed slightly hypercellular bone marrow for age with slight plasmacytosis.  5% plasma cells with kappa light chain restriction.  Cytogenetics normal.  Myeloma FISH negative  # 05/31/2021 Daratumumab -Pomalyst-dexamethasone-D1 patient had grade 2 infusion reaction to daratumumab and received steroids, Benadryl, famotidine.  He was able to finish treatment.D8  was able to finish Daratumumab with no infusion reactions.  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   INTERVAL HISTORY Luis Burke is a 65 y.o. male who has above history reviewed by me today for follow-up  Currently on Daratumumab Pomalyst dexamethasone Patient follows with podiatry Dr. Amalia Hailey for stress reaction fracture of the second metatarsal right.  He was cleared by Dr. Amalia Hailey to go back to [place].  During interval patient also had a PET scan done.  He presents to discuss results and also evaluation prior to chemotherapy.    Review of Systems  Constitutional:  Negative for appetite change, chills, fatigue, fever and unexpected weight change.  HENT:   Negative for hearing loss.   Eyes:  Negative for eye problems and icterus.  Respiratory:  Negative for chest tightness, cough and shortness of breath.   Cardiovascular:  Negative for chest pain and leg swelling.  Gastrointestinal:  Negative for abdominal distention and abdominal pain.  Endocrine: Negative for hot flashes.  Genitourinary:  Negative for difficulty urinating, dysuria and frequency.   Musculoskeletal:  Positive for back pain and neck pain. Negative for arthralgias.  Skin:  Negative for itching and rash.  Neurological:  Negative for light-headedness and numbness.  Hematological:  Negative for adenopathy. Does not bruise/bleed easily.  Psychiatric/Behavioral:  Negative for confusion.    MEDICAL HISTORY:  Past Medical History:  Diagnosis Date   AKI (acute kidney injury) ([place]) 01/06/2019   Anemia    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[place])    autologous stem cell bone marrow transplant on 04/23/2019.   Fatty liver  on u/s 07/2018   Hyperlipidemia 03/2004   Hypertension 1994   Multiple myeloma ([place])    Snores     SURGICAL HISTORY: Past Surgical History:  Procedure Laterality Date   CATARACT EXTRACTION W/PHACO Right 03/04/2018   Procedure: CATARACT EXTRACTION PHACO AND INTRAOCULAR LENS PLACEMENT ([place])  RIGHT TORIC;   Surgeon: Leandrew Koyanagi, MD;  Location: [place];  Service: Ophthalmology;  Laterality: Right;  [place] no Toric Lens 1:45 5.3   COLONOSCOPY WITH PROPOFOL N/A 07/20/2018   Procedure: COLONOSCOPY WITH PROPOFOL;  Surgeon: Jonathon Bellows, MD;  [facility];  Service: Gastroenterology;  Laterality: N/A;   ESOPHAGOGASTRODUODENOSCOPY (EGD) WITH PROPOFOL N/A 07/20/2018   Procedure: ESOPHAGOGASTRODUODENOSCOPY (EGD) WITH PROPOFOL;  Surgeon: Jonathon Bellows, MD;  Location[facility];  Service: Gastroenterology;  Laterality: N/A;   EYE SURGERY Right    HERNIA REPAIR     L Lap herniorraphy  04/2000   Left wrist ganglionectomy      SOCIAL HISTORY: Social History   Socioeconomic History   Marital status: Single    Spouse name: Not on file   Number of children: 1   Years of education: Not on file   Highest education level: Not on file  Occupational History   Occupation: [employer]  Tobacco Use   Smoking status: Never   Smokeless tobacco: Never   Tobacco comments:    Occassionally  Vaping Use   Vaping Use: Never used  Substance and Sexual Activity   Alcohol use: Not Currently    Alcohol/week: 3.0 standard drinks    Types: 3 Cans of beer per week   Drug use: No   Sexual activity: Not on file  Other Topics Concern   Not on file  Social History Narrative   Divorced 2009, married 06/15/21.     His daughter died 4 days after giving birth to the patient's granddaughter   Has joint custody of his deceased daughter's child   Worked at [employer] is [place], retired 2020.     Social Determinants of Health   Financial Resource Strain: Not on file  Food Insecurity: Not on file  Transportation Needs: Not on file  Physical Activity: Not on file  Stress: Not on file  Social Connections: Not on file  Intimate Partner Violence: Not on file    FAMILY HISTORY: Family History  Problem Relation Age of Onset   Hypertension Mother    Diabetes Mother    Heart  disease Mother        CAD   Dementia Mother    Prostate cancer Father    Hypertension Father    Heart disease Father        MI 02/03   Colon cancer Father    Hypertension Sister    Hypertension Sister    Hypertension Sister     ALLERGIES:  is allergic to bactrim [sulfamethoxazole-trimethoprim], clonidine derivatives, and tadalafil.  MEDICATIONS:  Current Outpatient Medications  Medication Sig Dispense Refill   acetaminophen (TYLENOL) 325 MG tablet Take 2 tablets (650 mg total) by mouth See admin instructions. 1 hour prior to chemotherapy treatments 30 tablet 0   amLODipine (NORVASC) 5 MG tablet TAKE 2 TABLETS BY MOUTH EVERY DAY 180 tablet 1   ASPIRIN 81 PO Take 81 mg by mouth daily.     B Complex Vitamins (VITAMIN B COMPLEX PO) SMARTSIG:By Mouth     chlorhexidine (PERIDEX) 0.12 % solution Use as directed 15 mLs in the mouth or throat 2 (two) times daily. 473 mL  1   co-enzyme Q-10 50 MG capsule Take 50 mg by mouth daily.     [employer] VITAMIN B12 1000 MCG tablet TAKE 1 TABLET BY MOUTH EVERY DAY 90 tablet 3   cyclobenzaprine (FLEXERIL) 5 MG tablet Take 1 tablet (5 mg total) by mouth daily as needed for muscle spasms. 30 tablet 0   dexamethasone (DECADRON) 4 MG tablet Take 5 tablets (20 mg total) by mouth once a week. Take at least 1 hour prior to infusion appt. Take with food. To start on 06/07/21. 20 tablet 3   diphenhydrAMINE (BENADRYL) 50 MG tablet Take 1 tablet (50 mg total) by mouth See admin instructions. Take 1 tablet 1 hour prior to chemotherapy treatments. 30 tablet 0   hydrALAZINE (APRESOLINE) 10 MG tablet TAKE 1 TABLET(10 MG) BY MOUTH THREE TIMES DAILY 270 tablet 1   lisinopril (ZESTRIL) 20 MG tablet Take 1 tablet (20 mg total) by mouth daily. Please keep appt on 07/05/21 90 tablet 0   loperamide (IMODIUM) 2 MG capsule Take 1 capsule (2 mg total) by mouth See admin instructions. Take 2 capsules at the onset of diarrhea, then 1 capsule every 2 hours or after every loose bowel movement.  Maximum 8 capsules per 24 hours. 60 capsule 0   LORazepam (ATIVAN) 1 MG tablet TAKE 1 TABLET (1 MG TOTAL) BY MOUTH EVERY 6 (SIX) HOURS AS NEEDED FOR ANXIETY (FOR NAUSEA)     magnesium chloride (SLOW-MAG) 64 MG TBEC SR tablet Take 1 tablet (64 mg total) by mouth daily. 60 tablet 0   metoprolol succinate (TOPROL-XL) 50 MG 24 hr tablet TAKE 4 TABLETS (200 MG TOTAL) BY MOUTH DAILY. TAKE WITH OR IMMEDIATELY FOLLOWING A MEAL. 360 tablet 2   montelukast (SINGULAIR) 10 MG tablet Take 1 tablet (10 mg total) by mouth See admin instructions. Take 1day prior to chemotherapy 30 tablet 1   ondansetron (ZOFRAN) 8 MG tablet Take 1 tablet (8 mg total) by mouth every 8 (eight) hours as needed. for nausea 30 tablet 1   pomalidomide (POMALYST) 2 MG capsule Take 1 capsule (2 mg total) by mouth daily. Take for 21 days on, then hold for 7 days. Repeat every 28 days. 21 capsule 0   pravastatin (PRAVACHOL) 10 MG tablet TAKE 1 TABLET BY MOUTH EVERY DAY 90 tablet 3   prochlorperazine (COMPAZINE) 10 MG tablet      sildenafil (REVATIO) 20 MG tablet Take 1-5 tablets (20-100 mg total) by mouth daily as needed. 50 tablet 12   zinc gluconate 50 MG tablet Take 50 mg by mouth daily.     No current facility-administered medications for this visit.   Facility-Administered Medications Ordered in Other Visits  Medication Dose Route Frequency Provider Last Rate Last Admin   0.9 %  sodium chloride infusion   Intravenous Once [name], MD         PHYSICAL EXAMINATION: ECOG PERFORMANCE STATUS: 1 - Symptomatic but completely ambulatory Vitals:   09/27/21 0909  BP: (!) 161/92  Pulse: 65  Temp: (!) 97.3 F (36.3 C)   Filed Weights   09/27/21 0909  Weight: 211 lb (95.7 kg)    Physical Exam Constitutional:      General: He is not in acute distress. HENT:     Head: Normocephalic and atraumatic.  Eyes:     General: No scleral icterus. Cardiovascular:     Rate and Rhythm: Normal rate and regular rhythm.     Heart sounds:  Normal heart sounds.  Pulmonary:  Effort: Pulmonary effort is normal. No respiratory distress.     Breath sounds: No wheezing.  Abdominal:     General: Bowel sounds are normal. There is no distension.     Palpations: Abdomen is soft.  Musculoskeletal:        General: No deformity. Normal range of motion.     Cervical back: Normal range of motion and neck supple.  Skin:    General: Skin is warm and dry.     Findings: No erythema or rash.  Neurological:     Mental Status: He is alert and oriented to person, place, and time. Mental status is at baseline.     Cranial Nerves: No cranial nerve deficit.     Coordination: Coordination normal.  Psychiatric:        Mood and Affect: Mood normal.     LABORATORY DATA:  I have reviewed the data as listed Lab Results  Component Value Date   WBC 4.2 09/27/2021   HGB 12.1 (L) 09/27/2021   HCT 36.1 (L) 09/27/2021   MCV 102.0 (H) 09/27/2021   PLT 130 (L) 09/27/2021   Recent Labs    08/30/21 0822 09/13/21 0716 09/27/21 0855  NA 136 136 138  K 4.6 3.8 4.5  CL 102 102 101  CO2 $Re'28 24 27  'LTL$ GLUCOSE 97 107* 99  BUN $Re'18 21 19  'ZcB$ CREATININE 1.30* 1.33* 1.32*  CALCIUM 9.3 9.5 9.8  GFRNONAA >60 59* 60*  PROT 6.8 6.8 6.7  ALBUMIN 4.1 4.3 4.3  AST $Re'16 17 15  'fpy$ ALT $R'21 22 18  'bU$ ALKPHOS 66 79 63  BILITOT 0.7 0.9 0.7    Iron/TIBC/Ferritin/ %Sat    Component Value Date/Time   IRON 74 07/01/2018 0944   TIBC 250 07/01/2018 0944   FERRITIN 357 07/01/2018 0944   IRONPCTSAT 30 07/01/2018 0944    Lab Results  Component Value Date   TOTALPROTELP 6.0 09/13/2021   ALBUMINELP 4.0 03/08/2021   A1GS 0.2 03/08/2021   A2GS 0.7 03/08/2021   BETS 1.1 03/08/2021   GAMS 1.1 03/08/2021   MSPIKE Not Observed 03/08/2021   SPEI Comment 03/08/2021   Lab Results  Component Value Date   KPAFRELGTCHN 22.4 (H) 09/13/2021   LAMBDASER 11.4 09/13/2021   KAPLAMBRATIO 1.96 (H) 09/13/2021    RADIOGRAPHIC STUDIES: I have personally reviewed the radiological  images as listed and agreed with the findings in the report. DG Cervical Spine Complete  Result Date: 07/21/2021 CLINICAL DATA:  Multiple myeloma Neck pain for few weeks EXAM: CERVICAL SPINE - COMPLETE 4+ VIEW COMPARISON:  08/10/2019 FINDINGS: There is reversal of normal cervical lordosis with apex at C5. There is minimal retrolisthesis C5 on C6. No significant vertebral body height loss. Moderate disc height loss and endplate degenerative changes seen at C5-C6. Mild disc height loss and endplate spurring seen at C6-C7. Severe right neural foraminal stenosis at C3-C4. Mild right neural foraminal stenosis at C5-C6 and C6-C7. Moderate left neural foraminal stenosis at C4-C5 and C5-C6. Mild left neural foraminal stenosis at C3-C4 and C6-C7. is Facet degenerative changes seen throughout the cervical spine. Minimal atheromatous plaque noted in the region the carotid bulbs. IMPRESSION: Multilevel degenerative changes of the cervical spine as above. Electronically Signed   By: Miachel Roux M.D.   On: 07/21/2021 09:13   NM PET Image Restage (PS) Whole Body (F-18 FDG)  Result Date: 09/21/2021 CLINICAL DATA:  Subsequent treatment strategy for multiple myeloma. EXAM: NUCLEAR MEDICINE PET WHOLE BODY TECHNIQUE: 11.3 mCi F-18 FDG was injected  intravenously. Full-ring PET imaging was performed from the head to foot after the radiotracer. CT data was obtained and used for attenuation correction and anatomic localization. Fasting blood glucose: 138 mg/dl COMPARISON:  08/25/2018 FINDINGS: Mediastinal blood pool activity: SUV max 2.6 HEAD/NECK: No hypermetabolic activity in the scalp. No hypermetabolic cervical lymph nodes. Incidental CT findings: none CHEST: No hypermetabolic mediastinal or hilar nodes. No suspicious pulmonary nodules on the CT scan. Incidental CT findings: Mild atherosclerotic calcification is noted in the wall of the thoracic aorta. Dependent atelectasis noted in the lung bases. ABDOMEN/PELVIS: No abnormal  hypermetabolic activity within the liver, pancreas, adrenal glands, or spleen. No hypermetabolic lymph nodes in the abdomen or pelvis. FDG accumulation in the region of the left groin herniorrhaphy has decreased in the interval with SUV max = 5.6 today compared to 7.6 previously. These findings are likely related to postsurgical change. Incidental CT findings: Bilateral renal cysts are again noted. 5.0 cm interpolar left renal cyst has increased in size from 3.4 cm previously but appears photopenic on PET imaging. There is mild atherosclerotic calcification of the abdominal aorta without aneurysm. SKELETON: No focal hypermetabolic activity to suggest skeletal metastasis. Incidental CT findings: The scattered tiny lucent bone lesion seen on the previous PET-CT involving the sternum, right humeral head, and spine are less prominent today and appear resolved in most locations. No suspicious bony hypermetabolism on today's study. Interval continued further healing of the anterior left second rib fracture identified previously. EXTREMITIES: No abnormal hypermetabolic activity in the lower extremities. Incidental CT findings: none IMPRESSION: 1. No evidence for suspicious or new hypermetabolism on today's study. 2. The scattered small lucent foci seen previously in the sternum and spine are less prominent today and have resolved in some regions. 3.  Aortic Atherosclerois (ICD10-170.0) Electronically Signed   By: Misty Stanley M.D.   On: 09/21/2021 15:25   DG Bone Survey Met  Result Date: 08/17/2021 CLINICAL DATA:  Multiple myeloma, in relapse EXAM: METASTATIC BONE SURVEY COMPARISON:  08/07/2021 and previous FINDINGS: 5 mm lucency in the right scapula medial to the glenoid is stable since 08/10/2018. 11 mm lucency in the mid right humeral shaft stable. Ill-defined lucencies in the proximal right humeral shaft are more conspicuous than on prior study. Scattered degenerative changes including: Cervical degenerative disc  disease C4-C7. Bilateral carotid bifurcation calcified plaque. Bilateral hip  DJD . Progressive degenerative disc disease in the lower thoracic spine, and L5-S1. IMPRESSION: 1. Stable small right humeral and scapular lucencies. 2. Ill-defined smaller lucencies in the proximal right humeral shaft more conspicuous than on prior study of 08/10/2018. 3. Chronic and degenerative changes as above. Electronically Signed   By: Lucrezia Europe M.D.   On: 08/17/2021 09:43   DG Foot Complete Right  Result Date: 08/07/2021 Please see detailed radiograph report in office note.     ASSESSMENT & PLAN:  1. Encounter for antineoplastic chemotherapy   2. Thrombocytopenia ([place])   3. Bone lesion   4. Multiple myeloma in relapse (H[facility])   5. Stage 3a chronic kidney disease ([place])    #Light chain multiple myeloma beta 2 microglobulin 5 and normal albumin.  Stage II.cytogenetics showed normal male chromosome, MDS FISH panel showed trisomy 19- Standard Risk.S/p RVD x 9 and  Autologous bone marrow stem cell transplant at [facility] on 04/23/2019.' Early relapse multiple myeloma.  06/04/2021 5% plasma cells Labs reviewed and discussed with patient Proceed with cycle 5-day 1 daratumumab.  Patient starts new cycles of Pomalyst 3 weeks on 1 week  off Advised patient to continue Dexamethasone 20mg  weekly.  Light chain ratio has decreased to 1.96.  #Lucent lesions in the bone. Skeletal survey showed stable small right humeral and scapular lucencies.  Ill-defined small lucencies in the proximal right humeral shaft more conspicuous than on prior study of 08/10/2018. Majority of his lucencies were not hypermetabolic on previous PET scan 09/21/2021, PET scan showed no evidence of suspicious or new hypermetabolism, scattered small lucent foci seen previously in the sternum and the spine are less prominent today and have resolved in some regions.  Aortic atherosclerosis. Results were reviewed and discussed with patient. Patient will resume  Xgeva today.  #Chronic thrombocytopenia, platelet count is stable.  130s thousand today. # CKD, avoid nephrotoxin.  Encourage hydration.  #Neck pain, previous Jan 2022 MRI cervical spine showed DJD.  Check X ray of cervical spine- DJD  physical therapy.  Flexeril 5mg  daily PRN  Post bone marrow transplant care He has completed 1 year of acyclovir.  Off acyclovir now. Post transplant vaccination finished at [facility].    #Bone health/osteopenia/multiple myeloma-  Continue Xgeva monthly Continue calcium and vitamin D supplementation. Follow up 2 weeks for Lab MD Daratumumab treatment.   Earlie Server, MD, PhD 09/27/2021

## 2021-09-27 NOTE — Patient Instructions (Signed)
[facility] AT [place]  Discharge Instructions: Thank you for choosing [facility] to provide your oncology and hematology care.  If you have a lab appointment with the [facility], please go directly to the [facility] and check in at the registration area.  Wear comfortable clothing and clothing appropriate for easy access to any Portacath or PICC line.   We strive to give you quality time with your provider. You may need to reschedule your appointment if you arrive late (15 or more minutes).  Arriving late affects you and other patients whose appointments are after yours.  Also, if you miss three or more appointments without notifying the office, you may be dismissed from the clinic at the providers discretion.      For prescription refill requests, have your pharmacy contact our office and allow 72 hours for refills to be completed.    Today you received the following chemotherapy and/or immunotherapy agents DARZALEX and XGEVA      To help prevent nausea and vomiting after your treatment, we encourage you to take your nausea medication as directed.  BELOW ARE SYMPTOMS THAT SHOULD BE REPORTED IMMEDIATELY: *FEVER GREATER THAN 100.4 F (38 C) OR HIGHER *CHILLS OR SWEATING *NAUSEA AND VOMITING THAT IS NOT CONTROLLED WITH YOUR NAUSEA MEDICATION *UNUSUAL SHORTNESS OF BREATH *UNUSUAL BRUISING OR BLEEDING *URINARY PROBLEMS (pain or burning when urinating, or frequent urination) *BOWEL PROBLEMS (unusual diarrhea, constipation, pain near the anus) TENDERNESS IN MOUTH AND THROAT WITH OR WITHOUT PRESENCE OF ULCERS (sore throat, sores in mouth, or a toothache) UNUSUAL RASH, SWELLING OR PAIN  UNUSUAL VAGINAL DISCHARGE OR ITCHING   Items with * indicate a potential emergency and should be followed up as soon as possible or go to the Emergency Department if any problems should occur.  Please show the CHEMOTHERAPY ALERT CARD or IMMUNOTHERAPY ALERT CARD at  check-in to the Emergency Department and triage nurse.  Should you have questions after your visit or need to cancel or reschedule your appointment, please contact [facility] [place] AT [place]  [phone] and follow the prompts.  Office hours are 8:00 a.m. to 4:30 p.m. Monday - Friday. Please note that voicemails left after 4:00 p.m. may not be returned until the following business day.  We are closed weekends and major holidays. You have access to a nurse at all times for urgent questions. Please call the main number to the clinic [phone] and follow the prompts.  For any non-urgent questions, you may also contact your provider using MyChart. We now offer e-Visits for anyone 45 and older to request care online for non-urgent symptoms. For details visit [email or web address].   Also download the MyChart app! Go to the app store, search "MyChart", open the app, select [facility], and log in with your MyChart username and password.  Due to Covid, a mask is required upon entering the hospital/clinic. If you do not have a mask, one will be given to you upon arrival. For doctor visits, patients may have 1 support person aged 61 or older with them. For treatment visits, patients cannot have anyone with them due to current Covid guidelines and our immunocompromised population.   Daratumumab injection What is this medication? DARATUMUMAB (dar a toom ue mab) is a monoclonal antibody. It is used to treat multiple myeloma. This medicine may be used for other purposes; ask your health care provider or pharmacist if you have questions. COMMON BRAND NAME(S): DARZALEX What should I tell my care team  before I take this medication? They need to know if you have any of these conditions: hereditary fructose intolerance infection (especially a virus infection such as chickenpox, herpes, or hepatitis B virus) lung or breathing disease (asthma, COPD) an unusual or allergic reaction to  daratumumab, sorbitol, other medicines, foods, dyes, or preservatives pregnant or trying to get pregnant breast-feeding How should I use this medication? This medicine is for infusion into a vein. It is given by a health care professional in a hospital or clinic setting. Talk to your pediatrician regarding the use of this medicine in children. Special care may be needed. Overdosage: If you think you have taken too much of this medicine contact a poison control center or emergency room at once. NOTE: This medicine is only for you. Do not share this medicine with others. What if I miss a dose? Keep appointments for follow-up doses as directed. It is important not to miss your dose. Call your doctor or health care professional if you are unable to keep an appointment. What may interact with this medication? Interactions have not been studied. This list may not describe all possible interactions. Give your health care provider a list of all the medicines, herbs, non-prescription drugs, or dietary supplements you use. Also tell them if you smoke, drink alcohol, or use illegal drugs. Some items may interact with your medicine. What should I watch for while using this medication? Your condition will be monitored carefully while you are receiving this medicine. This medicine can cause serious allergic reactions. To reduce your risk, your health care provider may give you other medicine to take before receiving this one. Be sure to follow the directions from your health care provider. This medicine can affect the results of blood tests to match your blood type. These changes can last for up to 6 months after the final dose. Your healthcare provider will do blood tests to match your blood type before you start treatment. Tell all of your healthcare providers that you are being treated with this medicine before receiving a blood transfusion. This medicine can affect the results of some tests used to determine  treatment response; extra tests may be needed to evaluate response. Do not become pregnant while taking this medicine or for 3 months after stopping it. Women should inform their health care provider if they wish to become pregnant or think they might be pregnant. There is a potential for serious side effects to an unborn child. Talk to your health care provider for more information. Do not breast-feed an infant while taking this medicine. What side effects may I notice from receiving this medication? Side effects that you should report to your care team as soon as possible: Allergic reactions--skin rash, itching or hives, swelling of the face, lips, or tongue Blurred vision Infection--fever, chills, cough, sore throat, pain or trouble passing urine Infusion reactions--dizziness, fast heartbeat, feeling faint or lightheaded, falls, headache, increase in blood pressure, nausea, vomiting, or wheezing or trouble breathing with loud or whistling sounds Unusual bleeding or bruising Side effects that usually do not require medical attention (report to your care team if they continue or are bothersome): Constipation Diarrhea Pain, tingling, numbness in the hands or feet Swelling of the ankles, feet, hands Tiredness This list may not describe all possible side effects. Call your doctor for medical advice about side effects. You may report side effects to FDA at 1-800-FDA-1088. Where should I keep my medication? This drug is given in a hospital or clinic and  will not be stored at home. NOTE: This sheet is a summary. It may not cover all possible information. If you have questions about this medicine, talk to your doctor, pharmacist, or health care provider.  2022 Elsevier/Gold Standard (2021-03-08 00:00:00)  Denosumab injection What is this medication? DENOSUMAB (den oh sue mab) slows bone breakdown. Prolia is used to treat osteoporosis in women after menopause and in men, and in people who are taking  corticosteroids for 6 months or more. Delton See is used to treat a high calcium level due to cancer and to prevent bone fractures and other bone problems caused by multiple myeloma or cancer bone metastases. Delton See is also used to treat giant cell tumor of the bone. This medicine may be used for other purposes; ask your health care provider or pharmacist if you have questions. COMMON BRAND NAME(S): Prolia, XGEVA What should I tell my care team before I take this medication? They need to know if you have any of these conditions: dental disease having surgery or tooth extraction infection kidney disease low levels of calcium or Vitamin D in the blood malnutrition on hemodialysis skin conditions or sensitivity thyroid or parathyroid disease an unusual reaction to denosumab, other medicines, foods, dyes, or preservatives pregnant or trying to get pregnant breast-feeding How should I use this medication? This medicine is for injection under the skin. It is given by a health care professional in a hospital or clinic setting. A special MedGuide will be given to you before each treatment. Be sure to read this information carefully each time. For Prolia, talk to your pediatrician regarding the use of this medicine in children. Special care may be needed. For Delton See, talk to your pediatrician regarding the use of this medicine in children. While this drug may be prescribed for children as young as 13 years for selected conditions, precautions do apply. Overdosage: If you think you have taken too much of this medicine contact a poison control center or emergency room at once. NOTE: This medicine is only for you. Do not share this medicine with others. What if I miss a dose? It is important not to miss your dose. Call your doctor or health care professional if you are unable to keep an appointment. What may interact with this medication? Do not take this medicine with any of the following medications: other  medicines containing denosumab This medicine may also interact with the following medications: medicines that lower your chance of fighting infection steroid medicines like prednisone or cortisone This list may not describe all possible interactions. Give your health care provider a list of all the medicines, herbs, non-prescription drugs, or dietary supplements you use. Also tell them if you smoke, drink alcohol, or use illegal drugs. Some items may interact with your medicine. What should I watch for while using this medication? Visit your doctor or health care professional for regular checks on your progress. Your doctor or health care professional may order blood tests and other tests to see how you are doing. Call your doctor or health care professional for advice if you get a fever, chills or sore throat, or other symptoms of a cold or flu. Do not treat yourself. This drug may decrease your body's ability to fight infection. Try to avoid being around people who are sick. You should make sure you get enough calcium and vitamin D while you are taking this medicine, unless your doctor tells you not to. Discuss the foods you eat and the vitamins you take with  your health care professional. See your dentist regularly. Brush and floss your teeth as directed. Before you have any dental work done, tell your dentist you are receiving this medicine. Do not become pregnant while taking this medicine or for 5 months after stopping it. Talk with your doctor or health care professional about your birth control options while taking this medicine. Women should inform their doctor if they wish to become pregnant or think they might be pregnant. There is a potential for serious side effects to an unborn child. Talk to your health care professional or pharmacist for more information. What side effects may I notice from receiving this medication? Side effects that you should report to your doctor or health care  professional as soon as possible: allergic reactions like skin rash, itching or hives, swelling of the face, lips, or tongue bone pain breathing problems dizziness jaw pain, especially after dental work redness, blistering, peeling of the skin signs and symptoms of infection like fever or chills; cough; sore throat; pain or trouble passing urine signs of low calcium like fast heartbeat, muscle cramps or muscle pain; pain, tingling, numbness in the hands or feet; seizures unusual bleeding or bruising unusually weak or tired Side effects that usually do not require medical attention (report to your doctor or health care professional if they continue or are bothersome): constipation diarrhea headache joint pain loss of appetite muscle pain runny nose tiredness upset stomach This list may not describe all possible side effects. Call your doctor for medical advice about side effects. You may report side effects to FDA at 1-800-FDA-1088. Where should I keep my medication? This medicine is only given in a clinic, doctor's office, or other health care setting and will not be stored at home. NOTE: This sheet is a summary. It may not cover all possible information. If you have questions about this medicine, talk to your doctor, pharmacist, or health care provider.  2022 Elsevier/Gold Standard (2018-02-06 00:00:00)

## 2021-10-01 ENCOUNTER — Encounter: Payer: Self-pay | Admitting: Ophthalmology

## 2021-10-05 ENCOUNTER — Other Ambulatory Visit: Payer: Self-pay | Admitting: Family Medicine

## 2021-10-05 NOTE — Discharge Instructions (Signed)

## 2021-10-09 ENCOUNTER — Other Ambulatory Visit
Admission: RE | Admit: 2021-10-09 | Discharge: 2021-10-09 | Disposition: A | Discharge status: Home / Self Care | Payer: Medicare Other | Attending: Ophthalmology | Admitting: Ophthalmology

## 2021-10-09 DIAGNOSIS — C9000 Multiple myeloma not having achieved remission: Secondary | ICD-10-CM | POA: Diagnosis present

## 2021-10-09 LAB — CBC
HCT: 37.8 % — ABNORMAL LOW (ref 39.0–52.0)
Hemoglobin: 12.9 g/dL — ABNORMAL LOW (ref 13.0–17.0)
MCH: 34.1 pg — ABNORMAL HIGH (ref 26.0–34.0)
MCHC: 34.1 g/dL (ref 30.0–36.0)
MCV: 100 fL (ref 80.0–100.0)
Platelets: 139 10*3/uL — ABNORMAL LOW (ref 150–400)
RBC: 3.78 MIL/uL — ABNORMAL LOW (ref 4.22–5.81)
RDW: 14.3 % (ref 11.5–15.5)
WBC: 5.6 10*3/uL (ref 4.0–10.5)
nRBC: 0 % (ref 0.0–0.2)

## 2021-10-10 ENCOUNTER — Ambulatory Visit
Admission: RE | Admit: 2021-10-10 | Discharge: 2021-10-10 | Disposition: A | Discharge status: Home / Self Care | Payer: Medicare Other | Attending: Ophthalmology | Admitting: Ophthalmology

## 2021-10-10 ENCOUNTER — Ambulatory Visit: Payer: Medicare Other | Admitting: Anesthesiology

## 2021-10-10 ENCOUNTER — Other Ambulatory Visit: Payer: Self-pay

## 2021-10-10 ENCOUNTER — Encounter: Payer: Self-pay | Admitting: Ophthalmology

## 2021-10-10 ENCOUNTER — Encounter
Admission: RE | Disposition: A | Discharge status: Home / Self Care | Payer: Self-pay | Source: Home / Self Care | Attending: Ophthalmology

## 2021-10-10 DIAGNOSIS — H2512 Age-related nuclear cataract, left eye: Secondary | ICD-10-CM | POA: Diagnosis present

## 2021-10-10 HISTORY — PX: CATARACT EXTRACTION: SUR2

## 2021-10-10 HISTORY — PX: CATARACT EXTRACTION W/PHACO: SHX586

## 2021-10-10 SURGERY — PHACOEMULSIFICATION, CATARACT, WITH IOL INSERTION
Anesthesia: Monitor Anesthesia Care | Site: Eye | Laterality: Left

## 2021-10-10 MED ORDER — BRIMONIDINE TARTRATE-TIMOLOL 0.2-0.5 % OP SOLN
OPHTHALMIC | Status: DC | PRN
  Administered 2021-10-10: 1 [drp] via OPHTHALMIC

## 2021-10-10 MED ORDER — SIGHTPATH DOSE#1 BSS IO SOLN
INTRAOCULAR | Status: DC | PRN
  Administered 2021-10-10: 11:00:00 1 mL

## 2021-10-10 MED ORDER — FENTANYL CITRATE (PF) 100 MCG/2ML IJ SOLN
INTRAMUSCULAR | Status: DC | PRN
  Administered 2021-10-10: 100 ug via INTRAVENOUS

## 2021-10-10 MED ORDER — LACTATED RINGERS IV SOLN: INTRAVENOUS | Status: DC

## 2021-10-10 MED ORDER — ARMC OPHTHALMIC DILATING DROPS
1.0000 "application " | OPHTHALMIC | Status: DC | PRN
  Administered 2021-10-10 (×3): 1 via OPHTHALMIC

## 2021-10-10 MED ORDER — SIGHTPATH DOSE#1 BSS IO SOLN
INTRAOCULAR | Status: DC | PRN
  Administered 2021-10-10: 15 mL

## 2021-10-10 MED ORDER — CEFUROXIME OPHTHALMIC INJECTION 1 MG/0.1 ML
INJECTION | OPHTHALMIC | Status: DC | PRN
  Administered 2021-10-10: 0.1 mL via INTRACAMERAL

## 2021-10-10 MED ORDER — TETRACAINE HCL 0.5 % OP SOLN
1.0000 [drp] | OPHTHALMIC | Status: DC | PRN
  Administered 2021-10-10 (×3): 1 [drp] via OPHTHALMIC

## 2021-10-10 MED ORDER — MIDAZOLAM HCL 2 MG/2ML IJ SOLN
INTRAMUSCULAR | Status: DC | PRN
  Administered 2021-10-10: 2 mg via INTRAVENOUS

## 2021-10-10 MED ORDER — SIGHTPATH DOSE#1 BSS IO SOLN
INTRAOCULAR | Status: DC | PRN
  Administered 2021-10-10: 11:00:00 78 mL via OPHTHALMIC

## 2021-10-10 MED ORDER — SIGHTPATH DOSE#1 NA HYALUR & NA CHOND-NA HYALUR IO KIT
PACK | INTRAOCULAR | Status: DC | PRN
  Administered 2021-10-10: 1 via OPHTHALMIC

## 2021-10-10 SURGICAL SUPPLY — 19 items
CANNULA ANT/CHMB 27G (MISCELLANEOUS) IMPLANT
CANNULA ANT/CHMB 27GA (MISCELLANEOUS) IMPLANT
GLOVE SRG 8 PF TXTR STRL LF DI (GLOVE) ×2 IMPLANT
GLOVE SURG ENC TEXT LTX SZ7.5 (GLOVE) ×4 IMPLANT
GLOVE SURG GAMMEX PI TX LF 7.5 (GLOVE) IMPLANT
GLOVE SURG UNDER POLY LF SZ8 (GLOVE) ×3
LENS IOL TECNIS EYHANCE 23.5 (Intraocular Lens) ×2 IMPLANT
NDL FILTER BLUNT 18X1 1/2 (NEEDLE) ×2 IMPLANT
NDL RETROBULBAR .5 NSTRL (NEEDLE) IMPLANT
NEEDLE FILTER BLUNT 18X 1/2SAF (NEEDLE) ×2
NEEDLE FILTER BLUNT 18X1 1/2 (NEEDLE) ×1 IMPLANT
PACK VIT ANT 23G (MISCELLANEOUS) IMPLANT
RING MALYGIN 7.0 (MISCELLANEOUS) IMPLANT
SUT ETHILON 10-0 CS-B-6CS-B-6 (SUTURE)
SUT VICRYL  9 0 (SUTURE)
SUT VICRYL 9 0 (SUTURE) IMPLANT
SUTURE EHLN 10-0 CS-B-6CS-B-6 (SUTURE) IMPLANT
SYR 3ML LL SCALE MARK (SYRINGE) ×4 IMPLANT
WATER STERILE IRR 250ML POUR (IV SOLUTION) ×4 IMPLANT

## 2021-10-10 NOTE — Transfer of Care (Signed)
Immediate Anesthesia Transfer of Care Note  Patient: Luis Burke  Procedure(s) Performed: CATARACT EXTRACTION PHACO AND INTRAOCULAR LENS PLACEMENT (IOC) LEFT (Left: Eye)  Patient Location: PACU  Anesthesia Type: MAC  Level of Consciousness: awake, alert  and patient cooperative  Airway and Oxygen Therapy: Patient Spontanous Breathing and Patient connected to supplemental oxygen  Post-op Assessment: Post-op Vital signs reviewed, Patient's Cardiovascular Status Stable, Respiratory Function Stable, Patent Airway and No signs of Nausea or vomiting  Post-op Vital Signs: Reviewed and stable  Complications: No notable events documented.

## 2021-10-10 NOTE — Anesthesia Procedure Notes (Signed)
Procedure Name: MAC Date/Time: 10/10/2021 11:13 AM Performed by: Mayme Genta, CRNA Pre-anesthesia Checklist: Patient identified, Emergency Drugs available, Suction available, Timeout performed and Patient being monitored Patient Re-evaluated:Patient Re-evaluated prior to induction Oxygen Delivery Method: Nasal cannula Placement Confirmation: positive ETCO2

## 2021-10-10 NOTE — Anesthesia Preprocedure Evaluation (Signed)
Anesthesia Evaluation  Patient identified by MRN, date of birth, ID band Patient awake    History of Anesthesia Complications Negative for: history of anesthetic complications  Airway Mallampati: III  TM Distance: >3 FB Neck ROM: Full    Dental no notable dental hx.    Pulmonary    Pulmonary exam normal        Cardiovascular hypertension, Pt. on medications and Pt. 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)     Musculoskeletal   Abdominal   Peds  Hematology Multiple myeloma   Anesthesia Other Findings   Reproductive/Obstetrics                             Anesthesia Physical Anesthesia Plan  ASA: 3  Anesthesia Plan: MAC   Post-op Pain Management: Minimal or no pain anticipated   Induction: Intravenous  PONV Risk Score and Plan: 1 and Treatment may vary due to age or medical condition, TIVA and Midazolam  Airway Management Planned: Nasal Cannula and Natural Airway  Additional Equipment: None  Intra-op Plan:   Post-operative Plan: Extubation in OR  Informed Consent:     Dental advisory given  Plan Discussed with: CRNA  Anesthesia Plan Comments:         Anesthesia Quick Evaluation

## 2021-10-10 NOTE — Op Note (Signed)
°  OPERATIVE NOTE  Luis Burke [id number] 10/10/2021   PREOPERATIVE DIAGNOSIS:  Nuclear sclerotic cataract left eye. H25.12   POSTOPERATIVE DIAGNOSIS:    Nuclear sclerotic cataract left eye.     PROCEDURE:  Phacoemusification with posterior chamber intraocular lens placement of the left eye  Ultrasound time: Procedure(s) with comments: CATARACT EXTRACTION PHACO AND INTRAOCULAR LENS PLACEMENT (IOC) LEFT (Left) - 5.16 1:03.8  LENS:   Implant Name Type Inv. Item Serial No. Manufacturer Lot No. LRB No. Used Action  LENS IOL TECNIS EYHANCE 23.5 - [id number] Intraocular Lens LENS IOL TECNIS EYHANCE 23.5 [id number] JOHNSON   Left 1 Implanted      SURGEON:  Wyonia Hough, MD   ANESTHESIA:  Topical with tetracaine drops and 2% Xylocaine jelly, augmented with 1% preservative-free intracameral lidocaine.    COMPLICATIONS:  None.   DESCRIPTION OF PROCEDURE:  The patient was identified in the holding room and transported to the operating room and placed in the supine position under the operating microscope.  The left eye was identified as the operative eye and it was prepped and draped in the usual sterile ophthalmic fashion.   A 1 millimeter clear-corneal paracentesis was made at the 1:30 position.  0.5 ml of preservative-free 1% lidocaine was injected into the anterior chamber.  The anterior chamber was filled with Viscoat viscoelastic.  A 2.4 millimeter keratome was used to make a near-clear corneal incision at the 10:30 position.  .  A curvilinear capsulorrhexis was made with a cystotome and capsulorrhexis forceps.  Balanced salt solution was used to hydrodissect and hydrodelineate the nucleus.   Phacoemulsification was then used in stop and chop fashion to remove the lens nucleus and epinucleus.  The remaining cortex was then removed using the irrigation and aspiration handpiece. Provisc was then placed into the capsular bag to distend it for lens placement.  A lens was then  injected into the capsular bag.  The remaining viscoelastic was aspirated.   Wounds were hydrated with balanced salt solution.  The anterior chamber was inflated to a physiologic pressure with balanced salt solution.  No wound leaks were noted. Cefuroxime 0.1 ml of a 10mg /ml solution was injected into the anterior chamber for a dose of 1 mg of intracameral antibiotic at the completion of the case.   Timolol and Brimonidine drops were applied to the eye.  The patient was taken to the recovery room in stable condition without complications of anesthesia or surgery.  [name] 10/10/2021, 11:29 AM

## 2021-10-10 NOTE — H&P (Signed)
[facility]   Primary Care Physician:  Tonia Ghent, MD Ophthalmologist: Dr. Leandrew Koyanagi  Pre-Procedure History & Physical: HPI:  Luis Burke is a 65 y.o. male here for ophthalmic surgery.   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   Fatty liver    on u/s 07/2018   Hyperlipidemia 03/2004   Hypertension 1994   Multiple myeloma ([place])    Snores     Past Surgical History:  Procedure Laterality Date   CATARACT EXTRACTION W/PHACO Right 03/04/2018   Procedure: CATARACT EXTRACTION PHACO AND INTRAOCULAR LENS PLACEMENT ([place])  RIGHT TORIC;  Surgeon: Leandrew Koyanagi, MD;  Location: [facility];  Service: Ophthalmology;  Laterality: Right;  Per [place] no Toric Lens 1:45 5.3   COLONOSCOPY WITH PROPOFOL N/A 07/20/2018   Procedure: COLONOSCOPY WITH PROPOFOL;  Surgeon: Jonathon Bellows, MD;  Location: [facility] ENDOSCOPY;  Service: Gastroenterology;  Laterality: N/A;   ESOPHAGOGASTRODUODENOSCOPY (EGD) WITH PROPOFOL N/A 07/20/2018   Procedure: ESOPHAGOGASTRODUODENOSCOPY (EGD) WITH PROPOFOL;  Surgeon: Jonathon Bellows, MD;  Location: [facility] ENDOSCOPY;  Service: Gastroenterology;  Laterality: N/A;   EYE SURGERY Right    HERNIA REPAIR     L Lap herniorraphy  04/2000   Left wrist ganglionectomy      Prior to Admission medications   Medication Sig Start Date End Date Taking? Authorizing Provider  acetaminophen (TYLENOL) 325 MG tablet Take 2 tablets (650 mg total) by mouth See admin instructions. 1 hour prior to chemotherapy treatments 06/07/21  Yes Earlie Server, MD  amLODipine (NORVASC) 5 MG tablet TAKE 2 TABLETS BY MOUTH EVERY DAY 05/07/21  Yes Tonia Ghent, MD  ASPIRIN 81 PO Take 81 mg by mouth daily.   Yes [provider]  B Complex Vitamins (VITAMIN B COMPLEX PO) SMARTSIG:By Mouth 11/17/19  Yes [provider]   co-enzyme Q-10 50 MG capsule Take 50 mg by mouth daily.   Yes [provider]  [facility] VITAMIN B12 1000 MCG tablet TAKE 1 TABLET BY MOUTH EVERY DAY 01/09/21  Yes Tonia Ghent, MD  cyclobenzaprine (FLEXERIL) 5 MG tablet Take 1 tablet (5 mg total) by mouth daily as needed for muscle spasms. 07/19/21  Yes Earlie Server, MD  Denosumab (XGEVA [place]) Inject into the skin every 30 (thirty) days. Last rcvd 09/27/21   Yes [provider]  dexamethasone (DECADRON) 4 MG tablet Take 5 tablets (20 mg total) by mouth once a week. Take at least 1 hour prior to infusion appt. Take with food. To start on 06/07/21. 08/02/21  Yes Earlie Server, MD  diphenhydrAMINE (BENADRYL) 50 MG tablet Take 1 tablet (50 mg total) by mouth See admin instructions. Take 1 tablet 1 hour prior to chemotherapy treatments. 06/07/21  Yes Earlie Server, MD  hydrALAZINE (APRESOLINE) 10 MG tablet TAKE 1 TABLET(10 MG) BY MOUTH THREE TIMES DAILY 08/16/21  Yes Tonia Ghent, MD  lisinopril (ZESTRIL) 20 MG tablet TAKE 1 TABLET BY MOUTH EVERY DAY 10/05/21  Yes Tonia Ghent, MD  loperamide (IMODIUM) 2 MG capsule Take 1 capsule (2 mg total) by mouth See admin instructions. Take 2 capsules at the onset of diarrhea, then 1 capsule every 2 hours or after every loose bowel movement. Maximum 8 capsules per 24 hours. 06/12/21  Yes Earlie Server, MD  LORazepam (ATIVAN) 1 MG tablet TAKE 1 TABLET (1 MG  TOTAL) BY MOUTH EVERY 6 (SIX) HOURS AS NEEDED FOR ANXIETY (FOR NAUSEA) 04/14/19  Yes [provider]  magnesium chloride (SLOW-MAG) 64 MG TBEC SR tablet Take 1 tablet (64 mg total) by mouth daily. 06/22/19  Yes Earlie Server, MD  metoprolol succinate (TOPROL-XL) 50 MG 24 hr tablet TAKE 4 TABLETS (200 MG TOTAL) BY MOUTH DAILY. TAKE WITH OR IMMEDIATELY FOLLOWING A MEAL. 01/19/21  Yes Tonia Ghent, MD  montelukast (SINGULAIR) 10 MG tablet Take 1 tablet (10 mg total) by mouth See admin instructions. Take 1day prior to chemotherapy 07/08/21  Yes Earlie Server, MD  ondansetron  (ZOFRAN) 8 MG tablet Take 1 tablet (8 mg total) by mouth every 8 (eight) hours as needed. for nausea 02/23/21  Yes Borders, Kirt Boys, NP  pomalidomide (POMALYST) 2 MG capsule Take 1 capsule (2 mg total) by mouth daily. Take for 21 days on, then hold for 7 days. Repeat every 28 days. 09/18/21  Yes Earlie Server, MD  pravastatin (PRAVACHOL) 10 MG tablet TAKE 1 TABLET BY MOUTH EVERY DAY 09/04/21  Yes Tonia Ghent, MD  prochlorperazine (COMPAZINE) 10 MG tablet  04/14/19  Yes [provider]  zinc gluconate 50 MG tablet Take 50 mg by mouth daily.   Yes [provider]  chlorhexidine (PERIDEX) 0.12 % solution Use as directed 15 mLs in the mouth or throat 2 (two) times daily. 06/19/21   Earlie Server, MD  sildenafil (REVATIO) 20 MG tablet Take 1-5 tablets (20-100 mg total) by mouth daily as needed. Patient not taking: Reported on 10/01/2021 07/23/21   Tonia Ghent, MD    Allergies as of 09/25/2021 - Review Complete 09/20/2021  Allergen Reaction Noted   Bactrim [sulfamethoxazole-trimethoprim]  01/06/2019   Clonidine derivatives  06/16/2019   Tadalafil  02/14/2009    Family History  Problem Relation Age of Onset   Hypertension Mother    Diabetes Mother    Heart disease Mother        CAD   Dementia Mother    Prostate cancer Father    Hypertension Father    Heart disease Father        MI 02/03   Colon cancer Father    Hypertension Sister    Hypertension Sister    Hypertension Sister     Social History   Socioeconomic History   Marital status: Single    Spouse name: Not on file   Number of children: 1   Years of education: Not on file   Highest education level: Not on file  Occupational History   Occupation: [employer]  Tobacco Use   Smoking status: Never   Smokeless tobacco: Never   Tobacco comments:    Occassionally  Vaping Use   Vaping Use: Never used  Substance and Sexual Activity   Alcohol use: Not Currently    Alcohol/week: 3.0 standard drinks    Types: 3  Cans of beer per week   Drug use: No   Sexual activity: Not on file  Other Topics Concern   Not on file  Social History Narrative   Divorced 2009, married Jun 16, 2021.     His daughter died 4 days after giving birth to the patient's granddaughter   Has joint custody of his deceased daughter's child   Worked at [employer] is [place], retired 2020.     Social Determinants of Health   Financial Resource Strain: Not on file  Food Insecurity: Not on file  Transportation Needs: Not on file  Physical  Activity: Not on file  Stress: Not on file  Social Connections: Not on file  Intimate Partner Violence: Not on file    Review of Systems: See HPI, otherwise negative ROS  Physical Exam: BP (!) 157/86    Pulse 63    Temp (!) 97.5 F (36.4 C) (Temporal)    Resp 18    Ht _0  (1.778 m)    Wt 93.9 kg    SpO2 96%    BMI 29.70 kg/m  General:   Alert,  pleasant and cooperative in NAD Head:  Normocephalic and atraumatic. Lungs:  Clear to auscultation.    Heart:  Regular rate and rhythm.   Impression/Plan: [name] is here for ophthalmic surgery.  Risks, benefits, limitations, and alternatives regarding ophthalmic surgery have been reviewed with the patient.  Questions have been answered.  All parties agreeable.   Leandrew Koyanagi, MD  10/10/2021, 10:06 AM

## 2021-10-10 NOTE — Anesthesia Postprocedure Evaluation (Signed)
Anesthesia Post Note  Patient: Luis Burke  Procedure(s) Performed: CATARACT EXTRACTION PHACO AND INTRAOCULAR LENS PLACEMENT (I[facility]) LEFT (Left: Eye)     Patient location during evaluation: PACU Anesthesia Type: MAC Level of consciousness: awake and alert Pain management: pain level controlled Vital Signs Assessment: post-procedure vital signs reviewed and stable Respiratory status: spontaneous breathing Cardiovascular status: blood pressure returned to baseline Postop Assessment: no apparent nausea or vomiting, adequate PO intake and no headache Anesthetic complications: no   No notable events documented.  Adele Barthel Ronny Ruddell

## 2021-10-11 ENCOUNTER — Inpatient Hospital Stay (HOSPITAL_BASED_OUTPATIENT_CLINIC_OR_DEPARTMENT_OTHER): Payer: Medicare Other | Admitting: Oncology

## 2021-10-11 ENCOUNTER — Ambulatory Visit: Payer: Medicare Other | Admitting: Oncology

## 2021-10-11 ENCOUNTER — Inpatient Hospital Stay: Payer: Medicare Other

## 2021-10-11 ENCOUNTER — Other Ambulatory Visit: Payer: Medicare Other

## 2021-10-11 ENCOUNTER — Encounter: Payer: Self-pay | Admitting: Oncology

## 2021-10-11 ENCOUNTER — Ambulatory Visit: Payer: Medicare Other

## 2021-10-11 VITALS — BP 133/77 | HR 61 | Resp 18

## 2021-10-11 VITALS — BP 145/80 | HR 62 | Temp 98.5°F | Resp 16 | Ht 70.0 in | Wt 210.0 lb

## 2021-10-11 DIAGNOSIS — C9002 Multiple myeloma in relapse: Secondary | ICD-10-CM

## 2021-10-11 DIAGNOSIS — D696 Thrombocytopenia, unspecified: Secondary | ICD-10-CM

## 2021-10-11 DIAGNOSIS — M899 Disorder of bone, unspecified: Secondary | ICD-10-CM

## 2021-10-11 DIAGNOSIS — C9000 Multiple myeloma not having achieved remission: Secondary | ICD-10-CM

## 2021-10-11 DIAGNOSIS — Z5112 Encounter for antineoplastic immunotherapy: Secondary | ICD-10-CM | POA: Diagnosis not present

## 2021-10-11 DIAGNOSIS — Z5111 Encounter for antineoplastic chemotherapy: Secondary | ICD-10-CM

## 2021-10-11 DIAGNOSIS — N1831 Chronic kidney disease, stage 3a: Secondary | ICD-10-CM

## 2021-10-11 LAB — CBC WITH DIFFERENTIAL/PLATELET
Abs Immature Granulocytes: 0.02 10*3/uL (ref 0.00–0.07)
Basophils Absolute: 0 10*3/uL (ref 0.0–0.1)
Basophils Relative: 0 %
Eosinophils Absolute: 0.3 10*3/uL (ref 0.0–0.5)
Eosinophils Relative: 6 %
HCT: 37.9 % — ABNORMAL LOW (ref 39.0–52.0)
Hemoglobin: 12.9 g/dL — ABNORMAL LOW (ref 13.0–17.0)
Immature Granulocytes: 1 %
Lymphocytes Relative: 25 %
Lymphs Abs: 1.1 10*3/uL (ref 0.7–4.0)
MCH: 34.4 pg — ABNORMAL HIGH (ref 26.0–34.0)
MCHC: 34 g/dL (ref 30.0–36.0)
MCV: 101.1 fL — ABNORMAL HIGH (ref 80.0–100.0)
Monocytes Absolute: 0.8 10*3/uL (ref 0.1–1.0)
Monocytes Relative: 18 %
Neutro Abs: 2.2 10*3/uL (ref 1.7–7.7)
Neutrophils Relative %: 50 %
Platelets: 100 10*3/uL — ABNORMAL LOW (ref 150–400)
RBC: 3.75 MIL/uL — ABNORMAL LOW (ref 4.22–5.81)
RDW: 14.2 % (ref 11.5–15.5)
WBC: 4.4 10*3/uL (ref 4.0–10.5)
nRBC: 0 % (ref 0.0–0.2)

## 2021-10-11 LAB — COMPREHENSIVE METABOLIC PANEL
ALT: 18 U/L (ref 0–44)
AST: 15 U/L (ref 15–41)
Albumin: 4.2 g/dL (ref 3.5–5.0)
Alkaline Phosphatase: 74 U/L (ref 38–126)
Anion gap: 10 (ref 5–15)
BUN: 19 mg/dL (ref 8–23)
CO2: 26 mmol/L (ref 22–32)
Calcium: 9.4 mg/dL (ref 8.9–10.3)
Chloride: 99 mmol/L (ref 98–111)
Creatinine, Ser: 1.23 mg/dL (ref 0.61–1.24)
GFR, Estimated: >60 mL/min (ref >60)
Glucose, Bld: 107 mg/dL — ABNORMAL HIGH (ref 70–99)
Potassium: 4 mmol/L (ref 3.5–5.1)
Sodium: 135 mmol/L (ref 135–145)
Total Bilirubin: 0.8 mg/dL (ref 0.3–1.2)
Total Protein: 6.9 g/dL (ref 6.5–8.1)

## 2021-10-11 MED ORDER — ACETAMINOPHEN 325 MG PO TABS
650.0000 mg | ORAL_TABLET | Freq: Once | ORAL | Status: AC
  Administered 2021-10-11: 09:00:00 650 mg via ORAL
  Filled 2021-10-11: qty 2

## 2021-10-11 MED ORDER — DIPHENHYDRAMINE HCL 25 MG PO CAPS
50.0000 mg | ORAL_CAPSULE | Freq: Once | ORAL | Status: AC
  Administered 2021-10-11: 09:00:00 50 mg via ORAL
  Filled 2021-10-11: qty 2

## 2021-10-11 MED ORDER — DARATUMUMAB-HYALURONIDASE-FIHJ 1800-30000 MG-UT/15ML ~~LOC~~ SOLN
1800.0000 mg | Freq: Once | SUBCUTANEOUS | Status: AC
  Administered 2021-10-11: 11:00:00 1800 mg via SUBCUTANEOUS
  Filled 2021-10-11: qty 15

## 2021-10-11 NOTE — Progress Notes (Addendum)
Hematology/Oncology  Follow up note [phone] [phone]   Patient Care Team: Tonia Ghent, MD as PCP - General (Family Medicine) Earlie Server, MD as Consulting Physician (Oncology) Beather Arbour Lilyan Punt, MD as Referring Physician (Hematology and Oncology) Diannia Ruder, MD as Referring Physician (Hematology and Oncology)  REASON FOR VISIT Follow up for multiple myeloma.   HISTORY OF PRESENTING ILLNESS:  Luis Burke is a  65 y.o.  male with PMH listed below presents for follow up of multiple myeloma.  # 07/27/2018 multiple myeloma panel showed M protein of 0.1, IgG 662, IgA 49, IgM 7. Patient was called back to further labs done. 08/10/2018, free light chain ratio showed extremely high level of kappa free light chain 10,183, with a kappa lambda light chain ratio of 1414.31 LDH 164 Beta-2 microglobulin 5 Patient was called and discuss about results.  He was recommended to undergo bone marrow biopsy and PET scan. 08/19/2018 bone marrow biopsy showed hypercellular marrow 80%, involved by plasma cell neoplasm up to 95%.  Consistent with plasma cell myeloma. Myeloma FISH  gain of gain of CEP12  09/10/2018 skeletal survey showed questionable small lucent lesions within the midshaft of the humerus bilaterally.  Otherwise no suspicious focal lytic lesion or acute bone abnormality. 08/25/2018 PET scan showed no definite hypermetabolic bone disease but CT findings are highly suspicious for numerous small myelomatous lesions involving the spine, sternum and scattered ribs.  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 He received preparative regimen with melphalan 200 mg/m on 04/22/2019 followed by autologous stem cell infusion on 04/23/2019. Currently he is transfusion independent.  Transfusion criteria with as needed hemoglobin less than 7.5 for platelet less than 10,000.  Transplant course was complicated with febrile neutropenia grade 3,  treated with vancomycin and cephapirin until engraftment on 05/06/2019. Patient also had engraftment syndrome and had to be started on Solu-Medrol 25 mg twice daily on 05/05/2019.  Transition to Medrol Dosepak on 05/07/2019 to complete steroid taper as outpatient.  Patient is currently on acyclovir for 1 year after transplant, dose was reduced on 722 11/20/2018 due to renal function. Patient had a baseline CKD with creatinine running between 1.4-1.6.  He had acute on chronic kidney injury and creatinine went up to 2.2 on 722.  Fluid hydration was given and creatinine decreased to baseline 1.5-1.7. His Hickman catheter was removed.  Candida Cruris treated with topical clotrimazole 1% 3 times daily and to resolution..  #  seen by Dr. Tonia Brooms BMT team on 06/30/2019.revaccination at [place] begins at 12 months post transplant I discussed with [facility] hematology Dr.Choi and he recommends plans as listed below.  -patient to be started on Ixazomib maintenance: -Ixazomib 58m on D1/D8/D15 out of 28 day cycle.  -patient will start re-vaccination 12 months post transplant.  -Post transplant bone marrow biopsy if clinically indicated.  # seen by [facility] bone marrow transplant team in June 2021.  Patient has been started on immunization protocol.  He reports no new complaints.  #[facility]  Lucent lesion in the L4 spinous process measuring 4 mm which is  nonspecific. Consider confirmation of marrow replacing process with MRI.  2.  Incompletely characterized renal cysts   # He continues to have chronic back pain and neck pain.  [facility], MRI lumbar spine with and without contrast showed no evidence of spinal metastasis.  Lumbar spondylosis is most pronounced at L5-S1 where  is severe right and moderate left neural foraminal stenosis. 08/11/2020, x-ray cervical spine complete with flexion and extension showed The cervical spine is visualized from C1 to the  top of T1 on the lateral  view.  Reversal of the normal cervical lordosis with a focal kyphosis centered at the C5-C6 intervertebral level. Trace anterolisthesis of C4 onto C5. Trace retrolisthesis of C6 on C7. No evidence of dynamic instability is noted on  the flexion or extension views.   No prevertebral soft tissue swelling. Cervical vertebral body heights are maintained. Multilevel degenerative changes of the cervical spine including discogenic disease, most pronounced at C5-C6 and C6-C7, and facet arthropathy most pronounced at C4-C5. Multilevel mild to moderate left neural foraminal osseous narrowing of the mid to lower cervical vertebrae, possibly centrally/artifactual by patient positioning.  #07/28/2020 bone marrow normocellular marrow with polytypic plasmacytosis, 3 to 8%   Patient is in remission.  Cytogenetics negative for trisomy 12.  Negative myeloma FISH panel.   # Lumbar lesion,  11/13/20 [facility]  MRI cervical sine w/wo contrast - no evidence of metastatic disease C3-4 severe spinal stenosis, left neural foraminal narrowing. C4-5 spinal canal stenosis. [facility] MRI results showed no metastatic spine lesion. Chronic back pain and neck pain, images are consistent with chronic degenerative disease.  Patient continues to follow-up with [place] orthopedic surgeon.  04/03/2021, light chain ratio increased to 3.76.   05/15/2021 bone marrow biopsy showed slightly hypercellular bone marrow for age with slight plasmacytosis.  5% plasma cells with kappa light chain restriction.  Cytogenetics normal.  Myeloma FISH negative # 05/15/2021 bone marrow biopsy showed slightly hypercellular bone marrow for age with slight plasmacytosis.  5% plasma cells with kappa light chain restriction.  Cytogenetics normal.  Myeloma FISH negative  # 05/31/2021 Daratumumab -Pomalyst-dexamethasone-D1 patient had grade 2 infusion reaction to daratumumab and received steroids, Benadryl, famotidine.  He was able to finish treatment.D8  was able to finish Daratumumab with no infusion reactions.  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  Patient follows with podiatry Dr. Amalia Hailey for stress reaction fracture of the second metatarsal right.  He was cleared by Dr. Amalia Hailey to go back to [place].    INTERVAL HISTORY Luis Burke is a 65 y.o. male who has above history reviewed by me today for follow-up  Currently on Daratumumab Pomalyst dexamethasone Patient reports feeling well.  He has noticed shortness of breath after each Daratumumab treatments.  He took a dose of Singulair and feels better.    Review of Systems  Constitutional:  Negative for appetite change, chills, fatigue, fever and unexpected weight change.  HENT:   Negative for hearing loss.   Eyes:  Negative for eye problems and icterus.  Respiratory:  Positive for shortness of breath. Negative for chest tightness and cough.   Cardiovascular:  Negative for chest pain and leg swelling.  Gastrointestinal:  Negative for abdominal distention and abdominal pain.  Endocrine: Negative for hot flashes.  Genitourinary:  Negative for difficulty urinating, dysuria and frequency.   Musculoskeletal:  Positive for back pain and neck pain. Negative for arthralgias.  Skin:  Negative for itching and rash.  Neurological:  Negative for light-headedness and numbness.  Hematological:  Negative for adenopathy. Does not bruise/bleed easily.  Psychiatric/Behavioral:  Negative for confusion.    MEDICAL HISTORY:  Past Medical History:  Diagnosis Date   AKI (acute kidney injury) ([place]) 01/06/2019   Anemia    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[place])    autologous stem cell bone marrow transplant on 04/23/2019.  Fatty liver    on u/s 07/2018   Hyperlipidemia 03/2004   Hypertension 1994   Multiple myeloma ([place])    Snores     SURGICAL HISTORY: Past Surgical History:  Procedure Laterality Date   CATARACT EXTRACTION Left 10/10/2021   CATARACT EXTRACTION W/PHACO Right 03/04/2018   Procedure:  CATARACT EXTRACTION PHACO AND INTRAOCULAR LENS PLACEMENT ([place])  RIGHT TORIC;  Surgeon: Leandrew Koyanagi, MD;  Location: [place];  Service: Ophthalmology;  Laterality: Right;  [place] no Toric Lens 1:45 5.3   CATARACT EXTRACTION W/PHACO Left 10/10/2021   Procedure: CATARACT EXTRACTION PHACO AND INTRAOCULAR LENS PLACEMENT ([place]) LEFT;  Surgeon: Leandrew Koyanagi, MD;  Location: [place] [facility];  Service: Ophthalmology;  Laterality: Left;  5.16 1:03.8   COLONOSCOPY WITH PROPOFOL N/A 07/20/2018   Procedure: COLONOSCOPY WITH PROPOFOL;  Surgeon: Jonathon Bellows, MD;  Location[facility] ENDOSCOPY;  Service: Gastroenterology;  Laterality: N/A;   ESOPHAGOGASTRODUODENOSCOPY (EGD) WITH PROPOFOL N/A 07/20/2018   Procedure: ESOPHAGOGASTRODUODENOSCOPY (EGD) WITH PROPOFOL;  Surgeon: Jonathon Bellows, MD;  Location: [facility] ENDOSCOPY;  Service: Gastroenterology;  Laterality: N/A;   EYE SURGERY Right    HERNIA REPAIR     L Lap herniorraphy  04/2000   Left wrist ganglionectomy      SOCIAL HISTORY: Social History   Socioeconomic History   Marital status: Single    Spouse name: Not on file   Number of children: 1   Years of education: Not on file   Highest education level: Not on file  Occupational History   Occupation: [employer]  Tobacco Use   Smoking status: Never   Smokeless tobacco: Never   Tobacco comments:    Occassionally  Vaping Use   Vaping Use: Never used  Substance and Sexual Activity   Alcohol use: Not Currently    Alcohol/week: 3.0 standard drinks    Types: 3 Cans of beer per week   Drug use: No   Sexual activity: Not on file  Other Topics Concern   Not on file  Social History Narrative   Divorced 2009, married 06/04/21.     His daughter died 4 days after giving birth to the patient's granddaughter   Has joint custody of his deceased daughter's child   Worked at [employer] is [place], retired 2020.     Social Determinants of Health   Financial Resource Strain:  Not on file  Food Insecurity: Not on file  Transportation Needs: Not on file  Physical Activity: Not on file  Stress: Not on file  Social Connections: Not on file  Intimate Partner Violence: Not on file    FAMILY HISTORY: Family History  Problem Relation Age of Onset   Hypertension Mother    Diabetes Mother    Heart disease Mother        CAD   Dementia Mother    Prostate cancer Father    Hypertension Father    Heart disease Father        MI 02/03   Colon cancer Father    Hypertension Sister    Hypertension Sister    Hypertension Sister     ALLERGIES:  is allergic to bactrim [sulfamethoxazole-trimethoprim], clonidine derivatives, and tadalafil.  MEDICATIONS:  Current Outpatient Medications  Medication Sig Dispense Refill   acetaminophen (TYLENOL) 325 MG tablet Take 2 tablets (650 mg total) by mouth See admin instructions. 1 hour prior to chemotherapy treatments 30 tablet 0   amLODipine (NORVASC) 5 MG tablet TAKE 2 TABLETS BY MOUTH EVERY DAY 180 tablet 1  ASPIRIN 81 PO Take 81 mg by mouth daily.     B Complex Vitamins (VITAMIN B COMPLEX PO) Take 1 Dose by mouth daily.     chlorhexidine (PERIDEX) 0.12 % solution Use as directed 15 mLs in the mouth or throat 2 (two) times daily. 473 mL 1   co-enzyme Q-10 50 MG capsule Take 50 mg by mouth daily.     [employer] VITAMIN B12 1000 MCG tablet TAKE 1 TABLET BY MOUTH EVERY DAY 90 tablet 3   Denosumab (XGEVA [place]) Inject into the skin every 30 (thirty) days. Last rcvd 09/27/21     dexamethasone (DECADRON) 4 MG tablet Take 5 tablets (20 mg total) by mouth once a week. Take at least 1 hour prior to infusion appt. Take with food. To start on 06/07/21. 20 tablet 3   diphenhydrAMINE (BENADRYL) 50 MG tablet Take 1 tablet (50 mg total) by mouth See admin instructions. Take 1 tablet 1 hour prior to chemotherapy treatments. 30 tablet 0   hydrALAZINE (APRESOLINE) 10 MG tablet TAKE 1 TABLET(10 MG) BY MOUTH THREE TIMES DAILY 270 tablet 1   lisinopril  (ZESTRIL) 20 MG tablet TAKE 1 TABLET BY MOUTH EVERY DAY 90 tablet 2   LORazepam (ATIVAN) 1 MG tablet TAKE 1 TABLET (1 MG TOTAL) BY MOUTH EVERY 6 (SIX) HOURS AS NEEDED FOR ANXIETY (FOR NAUSEA)     magnesium chloride (SLOW-MAG) 64 MG TBEC SR tablet Take 1 tablet (64 mg total) by mouth daily. 60 tablet 0   metoprolol succinate (TOPROL-XL) 50 MG 24 hr tablet TAKE 4 TABLETS (200 MG TOTAL) BY MOUTH DAILY. TAKE WITH OR IMMEDIATELY FOLLOWING A MEAL. 360 tablet 2   montelukast (SINGULAIR) 10 MG tablet Take 1 tablet (10 mg total) by mouth See admin instructions. Take 1day prior to chemotherapy 30 tablet 1   pomalidomide (POMALYST) 2 MG capsule Take 1 capsule (2 mg total) by mouth daily. Take for 21 days on, then hold for 7 days. Repeat every 28 days. 21 capsule 0   pravastatin (PRAVACHOL) 10 MG tablet TAKE 1 TABLET BY MOUTH EVERY DAY 90 tablet 3   sildenafil (REVATIO) 20 MG tablet Take 1-5 tablets (20-100 mg total) by mouth daily as needed. 50 tablet 12   zinc gluconate 50 MG tablet Take 50 mg by mouth daily.     cyclobenzaprine (FLEXERIL) 5 MG tablet Take 1 tablet (5 mg total) by mouth daily as needed for muscle spasms. (Patient not taking: Reported on 10/11/2021) 30 tablet 0   loperamide (IMODIUM) 2 MG capsule Take 1 capsule (2 mg total) by mouth See admin instructions. Take 2 capsules at the onset of diarrhea, then 1 capsule every 2 hours or after every loose bowel movement. Maximum 8 capsules per 24 hours. (Patient not taking: Reported on 10/11/2021) 60 capsule 0   ondansetron (ZOFRAN) 8 MG tablet Take 1 tablet (8 mg total) by mouth every 8 (eight) hours as needed. for nausea (Patient not taking: Reported on 10/11/2021) 30 tablet 1   prochlorperazine (COMPAZINE) 10 MG tablet  (Patient not taking: Reported on 10/11/2021)     No current facility-administered medications for this visit.   Facility-Administered Medications Ordered in Other Visits  Medication Dose Route Frequency Provider Last Rate Last Admin    0.9 %  sodium chloride infusion   Intravenous Once [name], MD         PHYSICAL EXAMINATION: ECOG PERFORMANCE STATUS: 1 - Symptomatic but completely ambulatory Vitals:   10/11/21 0851  BP: (!) 145/80  Pulse: 62  Resp: 16  Temp: 98.5 F (36.9 C)  SpO2: 97%   Filed Weights   10/11/21 0851  Weight: 210 lb (95.3 kg)    Physical Exam Constitutional:      General: He is not in acute distress. HENT:     Head: Normocephalic and atraumatic.  Eyes:     General: No scleral icterus. Cardiovascular:     Rate and Rhythm: Normal rate and regular rhythm.     Heart sounds: Normal heart sounds.  Pulmonary:     Effort: Pulmonary effort is normal. No respiratory distress.     Breath sounds: No wheezing.  Abdominal:     General: Bowel sounds are normal. There is no distension.     Palpations: Abdomen is soft.  Musculoskeletal:        General: No deformity. Normal range of motion.     Cervical back: Normal range of motion and neck supple.  Skin:    General: Skin is warm and dry.     Findings: No erythema or rash.  Neurological:     Mental Status: He is alert and oriented to person, place, and time. Mental status is at baseline.     Cranial Nerves: No cranial nerve deficit.     Coordination: Coordination normal.  Psychiatric:        Mood and Affect: Mood normal.     LABORATORY DATA:  I have reviewed the data as listed Lab Results  Component Value Date   WBC 4.4 10/11/2021   HGB 12.9 (L) 10/11/2021   HCT 37.9 (L) 10/11/2021   MCV 101.1 (H) 10/11/2021   PLT 100 (L) 10/11/2021   Recent Labs    09/13/21 0716 09/27/21 0855 10/11/21 0818  NA 136 138 135  K 3.8 4.5 4.0  CL 102 101 99  CO2 $Re'24 27 26  'eho$ GLUCOSE 107* 99 107*  BUN $Re'21 19 19  'iQe$ CREATININE 1.33* 1.32* 1.23  CALCIUM 9.5 9.8 9.4  GFRNONAA 59* 60* >60  PROT 6.8 6.7 6.9  ALBUMIN 4.3 4.3 4.2  AST $Re'17 15 15  'pzC$ ALT $R'22 18 18  'BK$ ALKPHOS 79 63 74  BILITOT 0.9 0.7 0.8    Iron/TIBC/Ferritin/ %Sat    Component Value  Date/Time   IRON 74 07/01/2018 0944   TIBC 250 07/01/2018 0944   FERRITIN 357 07/01/2018 0944   IRONPCTSAT 30 07/01/2018 0944    Lab Results  Component Value Date   TOTALPROTELP 6.0 09/13/2021   ALBUMINELP 4.0 03/08/2021   A1GS 0.2 03/08/2021   A2GS 0.7 03/08/2021   BETS 1.1 03/08/2021   GAMS 1.1 03/08/2021   MSPIKE Not Observed 03/08/2021   SPEI Comment 03/08/2021   Lab Results  Component Value Date   KPAFRELGTCHN 22.4 (H) 09/13/2021   LAMBDASER 11.4 09/13/2021   KAPLAMBRATIO 1.96 (H) 09/13/2021    RADIOGRAPHIC STUDIES: I have personally reviewed the radiological images as listed and agreed with the findings in the report. DG Cervical Spine Complete  Result Date: 07/21/2021 CLINICAL DATA:  Multiple myeloma Neck pain for few weeks EXAM: CERVICAL SPINE - COMPLETE 4+ VIEW COMPARISON:  08/10/2019 FINDINGS: There is reversal of normal cervical lordosis with apex at C5. There is minimal retrolisthesis C5 on C6. No significant vertebral body height loss. Moderate disc height loss and endplate degenerative changes seen at C5-C6. Mild disc height loss and endplate spurring seen at C6-C7. Severe right neural foraminal stenosis at C3-C4. Mild right neural foraminal stenosis at C5-C6 and C6-C7. Moderate left neural foraminal stenosis  at C4-C5 and C5-C6. Mild left neural foraminal stenosis at C3-C4 and C6-C7. is Facet degenerative changes seen throughout the cervical spine. Minimal atheromatous plaque noted in the region the carotid bulbs. IMPRESSION: Multilevel degenerative changes of the cervical spine as above. Electronically Signed   By: Miachel Roux M.D.   On: 07/21/2021 09:13   NM PET Image Restage (PS) Whole Body (F-18 FDG)  Result Date: 09/21/2021 CLINICAL DATA:  Subsequent treatment strategy for multiple myeloma. EXAM: NUCLEAR MEDICINE PET WHOLE BODY TECHNIQUE: 11.3 mCi F-18 FDG was injected intravenously. Full-ring PET imaging was performed from the head to foot after the radiotracer.  CT data was obtained and used for attenuation correction and anatomic localization. Fasting blood glucose: 138 mg/dl COMPARISON:  08/25/2018 FINDINGS: Mediastinal blood pool activity: SUV max 2.6 HEAD/NECK: No hypermetabolic activity in the scalp. No hypermetabolic cervical lymph nodes. Incidental CT findings: none CHEST: No hypermetabolic mediastinal or hilar nodes. No suspicious pulmonary nodules on the CT scan. Incidental CT findings: Mild atherosclerotic calcification is noted in the wall of the thoracic aorta. Dependent atelectasis noted in the lung bases. ABDOMEN/PELVIS: No abnormal hypermetabolic activity within the liver, pancreas, adrenal glands, or spleen. No hypermetabolic lymph nodes in the abdomen or pelvis. FDG accumulation in the region of the left groin herniorrhaphy has decreased in the interval with SUV max = 5.6 today compared to 7.6 previously. These findings are likely related to postsurgical change. Incidental CT findings: Bilateral renal cysts are again noted. 5.0 cm interpolar left renal cyst has increased in size from 3.4 cm previously but appears photopenic on PET imaging. There is mild atherosclerotic calcification of the abdominal aorta without aneurysm. SKELETON: No focal hypermetabolic activity to suggest skeletal metastasis. Incidental CT findings: The scattered tiny lucent bone lesion seen on the previous PET-CT involving the sternum, right humeral head, and spine are less prominent today and appear resolved in most locations. No suspicious bony hypermetabolism on today's study. Interval continued further healing of the anterior left second rib fracture identified previously. EXTREMITIES: No abnormal hypermetabolic activity in the lower extremities. Incidental CT findings: none IMPRESSION: 1. No evidence for suspicious or new hypermetabolism on today's study. 2. The scattered small lucent foci seen previously in the sternum and spine are less prominent today and have resolved in some  regions. 3.  Aortic Atherosclerois (ICD10-170.0) Electronically Signed   By: Misty Stanley M.D.   On: 09/21/2021 15:25   DG Bone Survey Met  Result Date: 08/17/2021 CLINICAL DATA:  Multiple myeloma, in relapse EXAM: METASTATIC BONE SURVEY COMPARISON:  08/07/2021 and previous FINDINGS: 5 mm lucency in the right scapula medial to the glenoid is stable since 08/10/2018. 11 mm lucency in the mid right humeral shaft stable. Ill-defined lucencies in the proximal right humeral shaft are more conspicuous than on prior study. Scattered degenerative changes including: Cervical degenerative disc disease C4-C7. Bilateral carotid bifurcation calcified plaque. Bilateral hip  DJD . Progressive degenerative disc disease in the lower thoracic spine, and L5-S1. IMPRESSION: 1. Stable small right humeral and scapular lucencies. 2. Ill-defined smaller lucencies in the proximal right humeral shaft more conspicuous than on prior study of 08/10/2018. 3. Chronic and degenerative changes as above. Electronically Signed   By: Lucrezia Europe M.D.   On: 08/17/2021 09:43   DG Foot Complete Right  Result Date: 08/07/2021 Please see detailed radiograph report in office note.     ASSESSMENT & PLAN:  1. Encounter for antineoplastic chemotherapy   2. Thrombocytopenia ([place])   3. Multiple myeloma in  relapse ([place])   4. Bone lesion   5. Stage 3a chronic kidney disease (H[facility])    #Light chain multiple myeloma beta 2 microglobulin 5 and normal albumin.  Stage II.cytogenetics showed normal male chromosome, MDS FISH panel showed trisomy 85- Standard Risk.S/p RVD x 9 and  Autologous bone marrow stem cell transplant at [facility] on 04/23/2019.' Early relapse multiple myeloma.  06/04/2021 5% plasma cells Labs reviewed and discussed with patient Proceed with cycle 5-day 15 daratumumab.  He starts current cycle of Pomalyst 3 weeks on 1 week off Advised patient to continue Dexamethasone 20mg  weekly.   Light chain ratio has decreased to  1.96.  #Shortness of breath after Daratumumab treatments.  Recommend patient to take singular daily for 1 to 2 weeks after Daratumumab treatments and see if that would help his shortness of breath.  #Lucent lesions in the bone. Skeletal survey showed stable small right humeral and scapular lucencies.  Ill-defined small lucencies in the proximal right humeral shaft more conspicuous than on prior study of 08/10/2018. Majority of his lucencies were not hypermetabolic on previous PET scan 09/21/2021, PET scan showed no evidence of suspicious or new hypermetabolism, scattered small lucent foci seen previously in the sternum and the spine are less prominent today and have resolved in some regions.  Aortic atherosclerosis. Results were reviewed and discussed with patient. Resumed on Xgeva today-09/27/2021. -Plan to repeat bone marrow biopsy after 6 cycles of Daratumumab/Pomalyst.  #Chronic thrombocytopenia, platelet count is stable.   # CKD, avoid nephrotoxin.  Encourage hydration.  #Neck pain, previous Jan 2022 MRI cervical spine showed DJD.  Check X ray of cervical spine- DJD  physical therapy.  Flexeril 5mg  daily PRN  Post bone marrow transplant care He has completed 1 year of acyclovir.  Off acyclovir now. Post transplant vaccination finished at [facility].    #Bone health/osteopenia/multiple myeloma-  Continue Xgeva monthly Continue calcium and vitamin D supplementation. Follow up 2 weeks for Lab MD Daratumumab treatment.   Earlie Server, MD, PhD 10/11/2021

## 2021-10-11 NOTE — Patient Instructions (Signed)
[facility] [place] [place]   Discharge Instructions: Thank you for choosing [facility] to provide your oncology and hematology care.  If you have a lab appointment with the [facility], please go directly to the [facility] and check in at the registration area.  We strive to give you quality time with your provider. You may need to reschedule your appointment if you arrive late (15 or more minutes).  Arriving late affects you and other patients whose appointments are after yours.  Also, if you miss three or more appointments without notifying the office, you may be dismissed from the clinic at the providers discretion.      For prescription refill requests, have your pharmacy contact our office and allow 72 hours for refills to be completed.    Today you received the following chemotherapy and/or immunotherapy agents: Darzalex Faspro.      To help prevent nausea and vomiting after your treatment, we encourage you to take your nausea medication as directed.  BELOW ARE SYMPTOMS THAT SHOULD BE REPORTED IMMEDIATELY: *FEVER GREATER THAN 100.4 F (38 C) OR HIGHER *CHILLS OR SWEATING *NAUSEA AND VOMITING THAT IS NOT CONTROLLED WITH YOUR NAUSEA MEDICATION *UNUSUAL SHORTNESS OF BREATH *UNUSUAL BRUISING OR BLEEDING *URINARY PROBLEMS (pain or burning when urinating, or frequent urination) *BOWEL PROBLEMS (unusual diarrhea, constipation, pain near the anus) TENDERNESS IN MOUTH AND THROAT WITH OR WITHOUT PRESENCE OF ULCERS (sore throat, sores in mouth, or a toothache) UNUSUAL RASH, SWELLING OR PAIN  UNUSUAL VAGINAL DISCHARGE OR ITCHING   Items with * indicate a potential emergency and should be followed up as soon as possible or go to the Emergency Department if any problems should occur.  Please show the CHEMOTHERAPY ALERT CARD or IMMUNOTHERAPY ALERT CARD at check-in to the Emergency Department and triage nurse.  Should you have questions after your visit or  need to cancel or reschedule your appointment, please contact [facility]  Dept: [phone]  and follow the prompts.  Office hours are 8:00 a.m. to 4:30 p.m. Monday - Friday. Please note that voicemails left after 4:00 p.m. may not be returned until the following business day.  We are closed weekends and major holidays. You have access to a nurse at all times for urgent questions. Please call the main number to the clinic Dept: [phone] and follow the prompts.  For any non-urgent questions, you may also contact your provider using MyChart. We now offer e-Visits for anyone 66 and older to request care online for non-urgent symptoms. For details visit [email or web address].   Also download the MyChart app! Go to the app store, search "MyChart", open the app, select [facility], and log in with your MyChart username and password.  Due to Covid, a mask is required upon entering the hospital/clinic. If you do not have a mask, one will be given to you upon arrival. For doctor visits, patients may have 1 support person aged 69 or older with them. For treatment visits, patients cannot have anyone with them due to current Covid guidelines and our immunocompromised population.

## 2021-10-11 NOTE — Progress Notes (Signed)
Pt states that he feels the day of the treatment he feels he gets SOB, also he states that the day after chemo had diarrhea for a day

## 2021-10-12 LAB — KAPPA/LAMBDA LIGHT CHAINS
Kappa free light chain: 16.8 mg/L (ref 3.3–19.4)
Kappa, lambda light chain ratio: 1.29 (ref 0.26–1.65)
Lambda free light chains: 13 mg/L (ref 5.7–26.3)

## 2021-10-16 LAB — MULTIPLE MYELOMA PANEL, SERUM
Albumin SerPl Elph-Mcnc: 3.8 g/dL (ref 2.9–4.4)
Albumin/Glob SerPl: 1.6 (ref 0.7–1.7)
Alpha 1: 0.3 g/dL (ref 0.0–0.4)
Alpha2 Glob SerPl Elph-Mcnc: 0.7 g/dL (ref 0.4–1.0)
B-Globulin SerPl Elph-Mcnc: 1 g/dL (ref 0.7–1.3)
Gamma Glob SerPl Elph-Mcnc: 0.4 g/dL (ref 0.4–1.8)
Globulin, Total: 2.4 g/dL (ref 2.2–3.9)
IgA: 90 mg/dL (ref 61–437)
IgG (Immunoglobin G), Serum: 463 mg/dL — ABNORMAL LOW (ref 603–1613)
IgM (Immunoglobulin M), Srm: 8 mg/dL — ABNORMAL LOW (ref 20–172)
M Protein SerPl Elph-Mcnc: 0.1 g/dL — ABNORMAL HIGH
Total Protein ELP: 6.2 g/dL (ref 6.0–8.5)

## 2021-10-19 ENCOUNTER — Telehealth: Payer: Self-pay | Admitting: Pharmacist

## 2021-10-19 DIAGNOSIS — C9000 Multiple myeloma not having achieved remission: Secondary | ICD-10-CM

## 2021-10-19 MED ORDER — POMALIDOMIDE 2 MG PO CAPS: 2.0000 mg | ORAL_CAPSULE | Freq: Every day | ORAL | 0 refills | Status: DC

## 2021-10-19 NOTE — Telephone Encounter (Signed)
[facility] contacted office for Pomalyst refill request.

## 2021-10-25 ENCOUNTER — Inpatient Hospital Stay: Payer: Medicare Other | Attending: Oncology

## 2021-10-25 ENCOUNTER — Telehealth: Payer: Self-pay

## 2021-10-25 ENCOUNTER — Other Ambulatory Visit: Payer: Self-pay

## 2021-10-25 ENCOUNTER — Inpatient Hospital Stay (HOSPITAL_BASED_OUTPATIENT_CLINIC_OR_DEPARTMENT_OTHER): Payer: Medicare Other | Admitting: Oncology

## 2021-10-25 ENCOUNTER — Encounter: Payer: Self-pay | Admitting: Oncology

## 2021-10-25 ENCOUNTER — Inpatient Hospital Stay: Payer: Medicare Other

## 2021-10-25 VITALS — BP 136/83 | HR 69 | Temp 97.2°F | Wt 209.3 lb

## 2021-10-25 VITALS — BP 131/84 | HR 61

## 2021-10-25 DIAGNOSIS — C9002 Multiple myeloma in relapse: Secondary | ICD-10-CM

## 2021-10-25 DIAGNOSIS — N179 Acute kidney failure, unspecified: Secondary | ICD-10-CM

## 2021-10-25 DIAGNOSIS — Z5112 Encounter for antineoplastic immunotherapy: Secondary | ICD-10-CM | POA: Diagnosis not present

## 2021-10-25 DIAGNOSIS — Z5111 Encounter for antineoplastic chemotherapy: Secondary | ICD-10-CM

## 2021-10-25 DIAGNOSIS — D696 Thrombocytopenia, unspecified: Secondary | ICD-10-CM | POA: Diagnosis not present

## 2021-10-25 DIAGNOSIS — D701 Agranulocytosis secondary to cancer chemotherapy: Secondary | ICD-10-CM

## 2021-10-25 DIAGNOSIS — T451X5A Adverse effect of antineoplastic and immunosuppressive drugs, initial encounter: Secondary | ICD-10-CM

## 2021-10-25 DIAGNOSIS — N1831 Chronic kidney disease, stage 3a: Secondary | ICD-10-CM

## 2021-10-25 DIAGNOSIS — M899 Disorder of bone, unspecified: Secondary | ICD-10-CM

## 2021-10-25 DIAGNOSIS — C9000 Multiple myeloma not having achieved remission: Secondary | ICD-10-CM

## 2021-10-25 LAB — CBC WITH DIFFERENTIAL/PLATELET
Abs Immature Granulocytes: 0.02 10*3/uL (ref 0.00–0.07)
Basophils Absolute: 0 10*3/uL (ref 0.0–0.1)
Basophils Relative: 1 %
Eosinophils Absolute: 0.1 10*3/uL (ref 0.0–0.5)
Eosinophils Relative: 2 %
HCT: 37.1 % — ABNORMAL LOW (ref 39.0–52.0)
Hemoglobin: 12.7 g/dL — ABNORMAL LOW (ref 13.0–17.0)
Immature Granulocytes: 1 %
Lymphocytes Relative: 38 %
Lymphs Abs: 1.5 10*3/uL (ref 0.7–4.0)
MCH: 34.2 pg — ABNORMAL HIGH (ref 26.0–34.0)
MCHC: 34.2 g/dL (ref 30.0–36.0)
MCV: 100 fL (ref 80.0–100.0)
Monocytes Absolute: 0.8 10*3/uL (ref 0.1–1.0)
Monocytes Relative: 20 %
Neutro Abs: 1.5 10*3/uL — ABNORMAL LOW (ref 1.7–7.7)
Neutrophils Relative %: 38 %
Platelets: 115 10*3/uL — ABNORMAL LOW (ref 150–400)
RBC: 3.71 MIL/uL — ABNORMAL LOW (ref 4.22–5.81)
RDW: 14.3 % (ref 11.5–15.5)
WBC: 3.9 10*3/uL — ABNORMAL LOW (ref 4.0–10.5)
nRBC: 0 % (ref 0.0–0.2)

## 2021-10-25 LAB — COMPREHENSIVE METABOLIC PANEL
ALT: 17 U/L (ref 0–44)
AST: 15 U/L (ref 15–41)
Albumin: 4.3 g/dL (ref 3.5–5.0)
Alkaline Phosphatase: 60 U/L (ref 38–126)
Anion gap: 7 (ref 5–15)
BUN: 19 mg/dL (ref 8–23)
CO2: 24 mmol/L (ref 22–32)
Calcium: 9 mg/dL (ref 8.9–10.3)
Chloride: 104 mmol/L (ref 98–111)
Creatinine, Ser: 1.46 mg/dL — ABNORMAL HIGH (ref 0.61–1.24)
GFR, Estimated: 53 mL/min — ABNORMAL LOW (ref >60)
Glucose, Bld: 102 mg/dL — ABNORMAL HIGH (ref 70–99)
Potassium: 4.3 mmol/L (ref 3.5–5.1)
Sodium: 135 mmol/L (ref 135–145)
Total Bilirubin: 0.7 mg/dL (ref 0.3–1.2)
Total Protein: 6.8 g/dL (ref 6.5–8.1)

## 2021-10-25 MED ORDER — DARATUMUMAB-HYALURONIDASE-FIHJ 1800-30000 MG-UT/15ML ~~LOC~~ SOLN
1800.0000 mg | Freq: Once | SUBCUTANEOUS | Status: AC
  Administered 2021-10-25: 1800 mg via SUBCUTANEOUS
  Filled 2021-10-25: qty 15

## 2021-10-25 MED ORDER — DENOSUMAB 120 MG/1.7ML ~~LOC~~ SOLN
120.0000 mg | Freq: Once | SUBCUTANEOUS | Status: AC
  Administered 2021-10-25: 120 mg via SUBCUTANEOUS
  Filled 2021-10-25: qty 1.7

## 2021-10-25 MED ORDER — ACETAMINOPHEN 325 MG PO TABS
650.0000 mg | ORAL_TABLET | Freq: Once | ORAL | Status: AC
  Administered 2021-10-25: 650 mg via ORAL
  Filled 2021-10-25: qty 2

## 2021-10-25 MED ORDER — DIPHENHYDRAMINE HCL 25 MG PO CAPS
50.0000 mg | ORAL_CAPSULE | Freq: Once | ORAL | Status: AC
  Administered 2021-10-25: 50 mg via ORAL
  Filled 2021-10-25: qty 2

## 2021-10-25 NOTE — Telephone Encounter (Signed)
Pt scheduled for BM bx on Tues 11/20/21 at [facility] with arrival time 8a. Pt aware of appt.

## 2021-10-25 NOTE — Progress Notes (Signed)
Hematology/Oncology  Follow up note [phone] [phone]   Patient Care Team: Tonia Ghent, MD as PCP - General (Family Medicine) Earlie Server, MD as Consulting Physician (Oncology) Beather Arbour Lilyan Punt, MD as Referring Physician (Hematology and Oncology) Diannia Ruder, MD as Referring Physician (Hematology and Oncology)  REASON FOR VISIT Follow up for multiple myeloma.   HISTORY OF PRESENTING ILLNESS:  Luis Burke is a  66 y.o.  male with PMH listed below presents for follow up of multiple myeloma.  # 07/27/2018 multiple myeloma panel showed M protein of 0.1, IgG 662, IgA 49, IgM 7. Patient was called back to further labs done. 08/10/2018, free light chain ratio showed extremely high level of kappa free light chain 10,183, with a kappa lambda light chain ratio of 1414.31 LDH 164 Beta-2 microglobulin 5 Patient was called and discuss about results.  He was recommended to undergo bone marrow biopsy and PET scan. 08/19/2018 bone marrow biopsy showed hypercellular marrow 80%, involved by plasma cell neoplasm up to 95%.  Consistent with plasma cell myeloma. Myeloma FISH  gain of gain of CEP12  09/10/2018 skeletal survey showed questionable small lucent lesions within the midshaft of the humerus bilaterally.  Otherwise no suspicious focal lytic lesion or acute bone abnormality. 08/25/2018 PET scan showed no definite hypermetabolic bone disease but CT findings are highly suspicious for numerous small myelomatous lesions involving the spine, sternum and scattered ribs.  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 He received preparative regimen with melphalan 200 mg/m on 04/22/2019 followed by autologous stem cell infusion on 04/23/2019. Currently he is transfusion independent.  Transfusion criteria with as needed hemoglobin less than 7.5 for platelet less than 10,000.  Transplant course was complicated with febrile neutropenia grade 3,  treated with vancomycin and cephapirin until engraftment on 05/06/2019. Patient also had engraftment syndrome and had to be started on Solu-Medrol 25 mg twice daily on 05/05/2019.  Transition to Medrol Dosepak on 05/07/2019 to complete steroid taper as outpatient.  Patient is currently on acyclovir for 1 year after transplant, dose was reduced on 722 11/20/2018 due to renal function. Patient had a baseline CKD with creatinine running between 1.4-1.6.  He had acute on chronic kidney injury and creatinine went up to 2.2 on 722.  Fluid hydration was given and creatinine decreased to baseline 1.5-1.7. His Hickman catheter was removed.  Candida Cruris treated with topical clotrimazole 1% 3 times daily and to resolution..  #  seen by Dr. Tonia Brooms BMT team on 06/30/2019.revaccination at [place] begins at 12 months post transplant I discussed with [facility] hematology Dr.Choi and he recommends plans as listed below.  -patient to be started on Ixazomib maintenance: -Ixazomib 58m on D1/D8/D15 out of 28 day cycle.  -patient will start re-vaccination 12 months post transplant.  -Post transplant bone marrow biopsy if clinically indicated.  # seen by [facility] bone marrow transplant team in June 2021.  Patient has been started on immunization protocol.  He reports no new complaints.  #[facility]  Lucent lesion in the L4 spinous process measuring 4 mm which is  nonspecific. Consider confirmation of marrow replacing process with MRI.  2.  Incompletely characterized renal cysts   # He continues to have chronic back pain and neck pain.  [facility], MRI lumbar spine with and without contrast showed no evidence of spinal metastasis.  Lumbar spondylosis is most pronounced at L5-S1 where  is severe right and moderate left neural foraminal stenosis. 08/11/2020, x-ray cervical spine complete with flexion and extension showed The cervical spine is visualized from C1 to the  top of T1 on the lateral  view.  Reversal of the normal cervical lordosis with a focal kyphosis centered at the C5-C6 intervertebral level. Trace anterolisthesis of C4 onto C5. Trace retrolisthesis of C6 on C7. No evidence of dynamic instability is noted on  the flexion or extension views.   No prevertebral soft tissue swelling. Cervical vertebral body heights are maintained. Multilevel degenerative changes of the cervical spine including discogenic disease, most pronounced at C5-C6 and C6-C7, and facet arthropathy most pronounced at C4-C5. Multilevel mild to moderate left neural foraminal osseous narrowing of the mid to lower cervical vertebrae, possibly centrally/artifactual by patient positioning.  #07/28/2020 bone marrow normocellular marrow with polytypic plasmacytosis, 3 to 8%   Patient is in remission.  Cytogenetics negative for trisomy 12.  Negative myeloma FISH panel.   # Lumbar lesion,  11/13/20 [facility]  MRI cervical sine w/wo contrast - no evidence of metastatic disease C3-4 severe spinal stenosis, left neural foraminal narrowing. C4-5 spinal canal stenosis. [facility] MRI results showed no metastatic spine lesion. Chronic back pain and neck pain, images are consistent with chronic degenerative disease.  Patient continues to follow-up with [place] orthopedic surgeon.  04/03/2021, light chain ratio increased to 3.76.   05/15/2021 bone marrow biopsy showed slightly hypercellular bone marrow for age with slight plasmacytosis.  5% plasma cells with kappa light chain restriction.  Cytogenetics normal.  Myeloma FISH negative # 05/15/2021 bone marrow biopsy showed slightly hypercellular bone marrow for age with slight plasmacytosis.  5% plasma cells with kappa light chain restriction.  Cytogenetics normal.  Myeloma FISH negative  # 05/31/2021 Daratumumab -Pomalyst-dexamethasone-D1 patient had grade 2 infusion reaction to daratumumab and received steroids, Benadryl, famotidine.  He was able to finish treatment.D8  was able to finish Daratumumab with no infusion reactions.  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  Patient follows with podiatry Dr. Amalia Hailey for stress reaction fracture of the second metatarsal right.  He was cleared by Dr. Amalia Hailey to go back to [place].    08/16/21 Skeletal survey showed stable small right humeral and scapular lucencies.  Ill-defined small lucencies in the proximal right humeral shaft more conspicuous than on prior study of 08/10/2018. 09/21/2021, PET scan showed no evidence of suspicious or new hypermetabolism, scattered small lucent foci seen previously in the sternum and the spine are less prominent today and have resolved in some regions.  Aortic atherosclerosis.  INTERVAL HISTORY Luis Burke is a 66 y.o. male who has above history reviewed by me today for follow-up  Currently on Daratumumab Pomalyst dexamethasone Patient reports feeling well. No new complaints.  Denies fever, chills, SOB, nausea or vomiting.  He reports feeling sick for 3 days after last Xgeva treatment on 09/27/21. Previously he denies any side effects from [place] treatments.    Review of Systems  Constitutional:  Negative for appetite change, chills, fatigue, fever and unexpected weight change.  HENT:   Negative for hearing loss.   Eyes:  Negative for eye problems and icterus.  Respiratory:  Positive for shortness of breath. Negative for chest tightness and cough.   Cardiovascular:  Negative for chest pain and leg swelling.  Gastrointestinal:  Negative for abdominal distention and abdominal pain.  Endocrine: Negative for hot flashes.  Genitourinary:  Negative for difficulty urinating, dysuria and frequency.   Musculoskeletal:  Positive for back pain and  neck pain. Negative for arthralgias.  Skin:  Negative for itching and rash.  Neurological:  Negative for light-headedness and numbness.  Hematological:  Negative for adenopathy. Does not bruise/bleed easily.  Psychiatric/Behavioral:  Negative for  confusion.    MEDICAL HISTORY:  Past Medical History:  Diagnosis Date   AKI (acute kidney injury) ([place]) 01/06/2019   Anemia    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[place])    autologous stem cell bone marrow transplant on 04/23/2019.   Fatty liver    on u/s 07/2018   Hyperlipidemia 03/2004   Hypertension 1994   Multiple myeloma ([place])    Snores     SURGICAL HISTORY: Past Surgical History:  Procedure Laterality Date   CATARACT EXTRACTION Left 10/10/2021   CATARACT EXTRACTION W/PHACO Right 03/04/2018   Procedure: CATARACT EXTRACTION PHACO AND INTRAOCULAR LENS PLACEMENT ([place])  RIGHT TORIC;  Surgeon: Leandrew Koyanagi, MD;  Location: [place];  Service: Ophthalmology;  Laterality: Right;  [place] no Toric Lens 1:45 5.3   CATARACT EXTRACTION W/PHACO Left 10/10/2021   Procedure: CATARACT EXTRACTION PHACO AND INTRAOCULAR LENS PLACEMENT ([place]) LEFT;  Surgeon: Leandrew Koyanagi, MD;  Location: [place];  Service: Ophthalmology;  Laterality: Left;  5.16 1:03.8   COLONOSCOPY WITH PROPOFOL N/A 07/20/2018   Procedure: COLONOSCOPY WITH PROPOFOL;  Surgeon: Jonathon Bellows, MD;  Location: [facility] ENDOSCOPY;  Service: Gastroenterology;  Laterality: N/A;   ESOPHAGOGASTRODUODENOSCOPY (EGD) WITH PROPOFOL N/A 07/20/2018   Procedure: ESOPHAGOGASTRODUODENOSCOPY (EGD) WITH PROPOFOL;  Surgeon: Jonathon Bellows, MD;  Location: [facility] ENDOSCOPY;  Service: Gastroenterology;  Laterality: N/A;   EYE SURGERY Right    HERNIA REPAIR     L Lap herniorraphy  04/2000   Left wrist ganglionectomy      SOCIAL HISTORY: Social History   Socioeconomic History   Marital status: Single    Spouse name: Not on file   Number of children: 1   Years of education: Not on file   Highest education level: Not on file  Occupational History   Occupation: [employer]  Tobacco Use   Smoking status: Never   Smokeless tobacco: Never   Tobacco comments:    Occassionally  Vaping Use   Vaping Use: Never used  Substance  and Sexual Activity   Alcohol use: Not Currently    Alcohol/week: 3.0 standard drinks    Types: 3 Cans of beer per week   Drug use: No   Sexual activity: Not on file  Other Topics Concern   Not on file  Social History Narrative   Divorced 2009, married May 29, 2021.     His daughter died 4 days after giving birth to the patient's granddaughter   Has joint custody of his deceased daughter's child   Worked at [employer] is [place], retired 2020.     Social Determinants of Health   Financial Resource Strain: Not on file  Food Insecurity: Not on file  Transportation Needs: Not on file  Physical Activity: Not on file  Stress: Not on file  Social Connections: Not on file  Intimate Partner Violence: Not on file    FAMILY HISTORY: Family History  Problem Relation Age of Onset   Hypertension Mother    Diabetes Mother    Heart disease Mother        CAD   Dementia Mother    Prostate cancer Father    Hypertension Father    Heart disease Father        MI 02/03   Colon cancer Father    Hypertension  Sister    Hypertension Sister    Hypertension Sister     ALLERGIES:  is allergic to bactrim [sulfamethoxazole-trimethoprim], clonidine derivatives, and tadalafil.  MEDICATIONS:  Current Outpatient Medications  Medication Sig Dispense Refill   acetaminophen (TYLENOL) 325 MG tablet Take 2 tablets (650 mg total) by mouth See admin instructions. 1 hour prior to chemotherapy treatments 30 tablet 0   amLODipine (NORVASC) 5 MG tablet TAKE 2 TABLETS BY MOUTH EVERY DAY 180 tablet 1   ASPIRIN 81 PO Take 81 mg by mouth daily.     B Complex Vitamins (VITAMIN B COMPLEX PO) Take 1 Dose by mouth daily.     chlorhexidine (PERIDEX) 0.12 % solution Use as directed 15 mLs in the mouth or throat 2 (two) times daily. 473 mL 1   co-enzyme Q-10 50 MG capsule Take 50 mg by mouth daily.     [employer] VITAMIN B12 1000 MCG tablet TAKE 1 TABLET BY MOUTH EVERY DAY 90 tablet 3   cyclobenzaprine (FLEXERIL) 5 MG  tablet Take 1 tablet (5 mg total) by mouth daily as needed for muscle spasms. (Patient not taking: Reported on 10/11/2021) 30 tablet 0   Denosumab (XGEVA [place]) Inject into the skin every 30 (thirty) days. Last rcvd 09/27/21     dexamethasone (DECADRON) 4 MG tablet Take 5 tablets (20 mg total) by mouth once a week. Take at least 1 hour prior to infusion appt. Take with food. To start on 06/07/21. 20 tablet 3   diphenhydrAMINE (BENADRYL) 50 MG tablet Take 1 tablet (50 mg total) by mouth See admin instructions. Take 1 tablet 1 hour prior to chemotherapy treatments. 30 tablet 0   hydrALAZINE (APRESOLINE) 10 MG tablet TAKE 1 TABLET(10 MG) BY MOUTH THREE TIMES DAILY 270 tablet 1   lisinopril (ZESTRIL) 20 MG tablet TAKE 1 TABLET BY MOUTH EVERY DAY 90 tablet 2   loperamide (IMODIUM) 2 MG capsule Take 1 capsule (2 mg total) by mouth See admin instructions. Take 2 capsules at the onset of diarrhea, then 1 capsule every 2 hours or after every loose bowel movement. Maximum 8 capsules per 24 hours. (Patient not taking: Reported on 10/11/2021) 60 capsule 0   LORazepam (ATIVAN) 1 MG tablet TAKE 1 TABLET (1 MG TOTAL) BY MOUTH EVERY 6 (SIX) HOURS AS NEEDED FOR ANXIETY (FOR NAUSEA)     magnesium chloride (SLOW-MAG) 64 MG TBEC SR tablet Take 1 tablet (64 mg total) by mouth daily. 60 tablet 0   metoprolol succinate (TOPROL-XL) 50 MG 24 hr tablet TAKE 4 TABLETS (200 MG TOTAL) BY MOUTH DAILY. TAKE WITH OR IMMEDIATELY FOLLOWING A MEAL. 360 tablet 2   montelukast (SINGULAIR) 10 MG tablet Take 1 tablet (10 mg total) by mouth See admin instructions. Take 1day prior to chemotherapy 30 tablet 1   ondansetron (ZOFRAN) 8 MG tablet Take 1 tablet (8 mg total) by mouth every 8 (eight) hours as needed. for nausea (Patient not taking: Reported on 10/11/2021) 30 tablet 1   pomalidomide (POMALYST) 2 MG capsule Take 1 capsule (2 mg total) by mouth daily. Take for 21 days on, then hold for 7 days. Repeat every 28 days. 21 capsule 0    pravastatin (PRAVACHOL) 10 MG tablet TAKE 1 TABLET BY MOUTH EVERY DAY 90 tablet 3   prochlorperazine (COMPAZINE) 10 MG tablet  (Patient not taking: Reported on 10/11/2021)     sildenafil (REVATIO) 20 MG tablet Take 1-5 tablets (20-100 mg total) by mouth daily as needed. 50 tablet 12   zinc  gluconate 50 MG tablet Take 50 mg by mouth daily.     No current facility-administered medications for this visit.   Facility-Administered Medications Ordered in Other Visits  Medication Dose Route Frequency Provider Last Rate Last Admin   0.9 %  sodium chloride infusion   Intravenous Once Earlie Server, MD         PHYSICAL EXAMINATION: ECOG PERFORMANCE STATUS: 1 - Symptomatic but completely ambulatory There were no vitals filed for this visit.  There were no vitals filed for this visit.   Physical Exam Constitutional:      General: He is not in acute distress. HENT:     Head: Normocephalic and atraumatic.  Eyes:     General: No scleral icterus. Cardiovascular:     Rate and Rhythm: Normal rate and regular rhythm.     Heart sounds: Normal heart sounds.  Pulmonary:     Effort: Pulmonary effort is normal. No respiratory distress.     Breath sounds: No wheezing.  Abdominal:     General: Bowel sounds are normal. There is no distension.     Palpations: Abdomen is soft.  Musculoskeletal:        General: No deformity. Normal range of motion.     Cervical back: Normal range of motion and neck supple.  Skin:    General: Skin is warm and dry.     Findings: No erythema or rash.  Neurological:     Mental Status: He is alert and oriented to person, place, and time. Mental status is at baseline.     Cranial Nerves: No cranial nerve deficit.     Coordination: Coordination normal.  Psychiatric:        Mood and Affect: Mood normal.     LABORATORY DATA:  I have reviewed the data as listed Lab Results  Component Value Date   WBC 4.4 10/11/2021   HGB 12.9 (L) 10/11/2021   HCT 37.9 (L) 10/11/2021    MCV 101.1 (H) 10/11/2021   PLT 100 (L) 10/11/2021   Recent Labs    09/13/21 0716 09/27/21 0855 10/11/21 0818  NA 136 138 135  K 3.8 4.5 4.0  CL 102 101 99  CO2 $Re'24 27 26  'Zoi$ GLUCOSE 107* 99 107*  BUN $Re'21 19 19  'NXv$ CREATININE 1.33* 1.32* 1.23  CALCIUM 9.5 9.8 9.4  GFRNONAA 59* 60* >60  PROT 6.8 6.7 6.9  ALBUMIN 4.3 4.3 4.2  AST $Re'17 15 15  'fLu$ ALT $R'22 18 18  'Sh$ ALKPHOS 79 63 74  BILITOT 0.9 0.7 0.8    Iron/TIBC/Ferritin/ %Sat    Component Value Date/Time   IRON 74 07/01/2018 0944   TIBC 250 07/01/2018 0944   FERRITIN 357 07/01/2018 0944   IRONPCTSAT 30 07/01/2018 0944    Lab Results  Component Value Date   TOTALPROTELP 6.2 10/11/2021   ALBUMINELP 4.0 03/08/2021   A1GS 0.2 03/08/2021   A2GS 0.7 03/08/2021   BETS 1.1 03/08/2021   GAMS 1.1 03/08/2021   MSPIKE Not Observed 03/08/2021   SPEI Comment 03/08/2021   Lab Results  Component Value Date   KPAFRELGTCHN 16.8 10/11/2021   LAMBDASER 13.0 10/11/2021   KAPLAMBRATIO 1.29 10/11/2021    RADIOGRAPHIC STUDIES: I have personally reviewed the radiological images as listed and agreed with the findings in the report. NM PET Image Restage (PS) Whole Body (F-18 FDG)  Result Date: 09/21/2021 CLINICAL DATA:  Subsequent treatment strategy for multiple myeloma. EXAM: NUCLEAR MEDICINE PET WHOLE BODY TECHNIQUE: 11.3 mCi F-18 FDG was injected intravenously.  Full-ring PET imaging was performed from the head to foot after the radiotracer. CT data was obtained and used for attenuation correction and anatomic localization. Fasting blood glucose: 138 mg/dl COMPARISON:  08/25/2018 FINDINGS: Mediastinal blood pool activity: SUV max 2.6 HEAD/NECK: No hypermetabolic activity in the scalp. No hypermetabolic cervical lymph nodes. Incidental CT findings: none CHEST: No hypermetabolic mediastinal or hilar nodes. No suspicious pulmonary nodules on the CT scan. Incidental CT findings: Mild atherosclerotic calcification is noted in the wall of the thoracic aorta.  Dependent atelectasis noted in the lung bases. ABDOMEN/PELVIS: No abnormal hypermetabolic activity within the liver, pancreas, adrenal glands, or spleen. No hypermetabolic lymph nodes in the abdomen or pelvis. FDG accumulation in the region of the left groin herniorrhaphy has decreased in the interval with SUV max = 5.6 today compared to 7.6 previously. These findings are likely related to postsurgical change. Incidental CT findings: Bilateral renal cysts are again noted. 5.0 cm interpolar left renal cyst has increased in size from 3.4 cm previously but appears photopenic on PET imaging. There is mild atherosclerotic calcification of the abdominal aorta without aneurysm. SKELETON: No focal hypermetabolic activity to suggest skeletal metastasis. Incidental CT findings: The scattered tiny lucent bone lesion seen on the previous PET-CT involving the sternum, right humeral head, and spine are less prominent today and appear resolved in most locations. No suspicious bony hypermetabolism on today's study. Interval continued further healing of the anterior left second rib fracture identified previously. EXTREMITIES: No abnormal hypermetabolic activity in the lower extremities. Incidental CT findings: none IMPRESSION: 1. No evidence for suspicious or new hypermetabolism on today's study. 2. The scattered small lucent foci seen previously in the sternum and spine are less prominent today and have resolved in some regions. 3.  Aortic Atherosclerois (ICD10-170.0) Electronically Signed   By: Misty Stanley M.D.   On: 09/21/2021 15:25   DG Bone Survey Met  Result Date: 08/17/2021 CLINICAL DATA:  Multiple myeloma, in relapse EXAM: METASTATIC BONE SURVEY COMPARISON:  08/07/2021 and previous FINDINGS: 5 mm lucency in the right scapula medial to the glenoid is stable since 08/10/2018. 11 mm lucency in the mid right humeral shaft stable. Ill-defined lucencies in the proximal right humeral shaft are more conspicuous than on prior  study. Scattered degenerative changes including: Cervical degenerative disc disease C4-C7. Bilateral carotid bifurcation calcified plaque. Bilateral hip  DJD . Progressive degenerative disc disease in the lower thoracic spine, and L5-S1. IMPRESSION: 1. Stable small right humeral and scapular lucencies. 2. Ill-defined smaller lucencies in the proximal right humeral shaft more conspicuous than on prior study of 08/10/2018. 3. Chronic and degenerative changes as above. Electronically Signed   By: Lucrezia Europe M.D.   On: 08/17/2021 09:43   DG Foot Complete Right  Result Date: 08/07/2021 Please see detailed radiograph report in office note.     ASSESSMENT & PLAN:  1. Encounter for antineoplastic chemotherapy   2. Thrombocytopenia ([place])   3. Multiple myeloma in relapse ([place])   4. Bone lesion   5. Stage 3a chronic kidney disease ([place])   6. Chemotherapy induced neutropenia ([facility])    #Light chain multiple myeloma beta 2 microglobulin 5 and normal albumin.  Stage II.cytogenetics showed normal male chromosome, MDS FISH panel showed trisomy 82- Standard Risk.S/p RVD x 9 and  Autologous bone marrow stem cell transplant at [facility] on 04/23/2019.' Early relapse multiple myeloma.  06/04/2021 5% plasma cells Labs reviewed and discussed with patient Proceed with cycle 6-day 1 daratumumab.  Start cycle of Pomalyst 3  weeks on 1 week off Dexamethasone $RemoveBeforeDEI'20mg'[id number]$  weekly.   Light chain ratio has decreased to 1.29. Plan to obtain bone marrow biopsy after cycle 6.  #Shortness of breath after Daratumumab treatments.  symptoms are stable.  Recommend patient to utilize Singulair before and/or after Daratumumab treatments.  #Lucent lesions in the bone. Majority of his lucencies were not hypermetabolic on previous PET scan Resumed on Xgeva today-10/25/2021-Plan to repeat bone marrow biopsy after 6 cycles of Daratumumab/Pomalyst.  #Chronic thrombocytopenia, platelet count is stable.   # CKD, avoid nephrotoxin.  Encourage  hydration.  #Neck pain, previous Jan 2022 MRI cervical spine showed DJD.  Check X ray of cervical spine- DJD  physical therapy.  Flexeril $RemoveB'5mg'pWetMlus$  daily PRN  Post bone marrow transplant care He has completed 1 year of acyclovir.  Off acyclovir now. Post [facility] vaccination [facility].    #Bone health/osteopenia/multiple myeloma-  Continue Xgeva monthly Continue calcium and vitamin D supplementation. Follow up 2 weeks for Lab MD Daratumumab treatment.   Earlie Server, MD, PhD 10/25/2021

## 2021-10-25 NOTE — Patient Instructions (Signed)
[facility]  Discharge Instructions: °Thank you for choosing [facility] to provide your oncology and hematology care.  °If you have a lab appointment with the Cancer Center, please go directly to the Cancer Center and check in at the registration area. ° °Wear comfortable clothing and clothing appropriate for easy access to any Portacath or PICC line.  ° °We strive to give you quality time with your provider. You may need to reschedule your appointment if you arrive late (15 or more minutes).  Arriving late affects you and other patients whose appointments are after yours.  Also, if you miss three or more appointments without notifying the office, you may be dismissed from the clinic at the provider’s discretion.    °  °For prescription refill requests, have your pharmacy contact our office and allow 72 hours for refills to be completed.   ° °Today you received the following chemotherapy and/or immunotherapy agents     °  °To help prevent nausea and vomiting after your treatment, we encourage you to take your nausea medication as directed. ° °BELOW ARE SYMPTOMS THAT SHOULD BE REPORTED IMMEDIATELY: °*FEVER GREATER THAN 100.4 F (38 °C) OR HIGHER °*CHILLS OR SWEATING °*NAUSEA AND VOMITING THAT IS NOT CONTROLLED WITH YOUR NAUSEA MEDICATION °*UNUSUAL SHORTNESS OF BREATH °*UNUSUAL BRUISING OR BLEEDING °*URINARY PROBLEMS (pain or burning when urinating, or frequent urination) °*BOWEL PROBLEMS (unusual diarrhea, constipation, pain near the anus) °TENDERNESS IN MOUTH AND THROAT WITH OR WITHOUT PRESENCE OF ULCERS (sore throat, sores in mouth, or a toothache) °UNUSUAL RASH, SWELLING OR PAIN  °UNUSUAL VAGINAL DISCHARGE OR ITCHING  ° °Items with * indicate a potential emergency and should be followed up as soon as possible or go to the Emergency Department if any problems should occur. ° °Please show the CHEMOTHERAPY ALERT CARD or IMMUNOTHERAPY ALERT CARD at check-in to the  Emergency Department and triage nurse. ° °Should you have questions after your visit or need to cancel or reschedule your appointment, please contact [facility]  [phone] and follow the prompts.  Office hours are 8:00 a.m. to 4:30 p.m. Monday - Friday. Please note that voicemails left after 4:00 p.m. may not be returned until the following business day.  We are closed weekends and major holidays. You have access to a nurse at all times for urgent questions. Please call the main number to the clinic [phone] and follow the prompts. ° °For any non-urgent questions, you may also contact your provider using MyChart. We now offer e-Visits for anyone 18 and older to request care online for non-urgent symptoms. For details visit [email or web address]. °  °Also download the MyChart app! Go to the app store, search "MyChart", open the app, select [facility], and log in with your MyChart username and password. ° °Due to Covid, a mask is required upon entering the hospital/clinic. If you do not have a mask, one will be given to you upon arrival. For doctor visits, patients may have 1 support person aged 18 or older with them. For treatment visits, patients cannot have anyone with them due to current Covid guidelines and our immunocompromised population.  °

## 2021-10-25 NOTE — Telephone Encounter (Signed)
Request for [facility] faxed to specials scheduling.

## 2021-10-26 ENCOUNTER — Encounter: Payer: Self-pay | Admitting: Oncology

## 2021-10-26 NOTE — Telephone Encounter (Signed)
Dr. Yu please advise  

## 2021-10-30 ENCOUNTER — Ambulatory Visit (INDEPENDENT_AMBULATORY_CARE_PROVIDER_SITE_OTHER): Payer: Medicare Other | Admitting: Podiatry

## 2021-10-30 ENCOUNTER — Other Ambulatory Visit: Payer: Self-pay

## 2021-10-30 ENCOUNTER — Encounter: Payer: Self-pay | Admitting: Podiatry

## 2021-10-30 ENCOUNTER — Ambulatory Visit (INDEPENDENT_AMBULATORY_CARE_PROVIDER_SITE_OTHER): Payer: Medicare Other

## 2021-10-30 DIAGNOSIS — M84374A Stress fracture, right foot, initial encounter for fracture: Secondary | ICD-10-CM

## 2021-10-30 NOTE — Progress Notes (Signed)
HPI: 66 y.o. male presenting today for follow-up evaluation of a stress reaction fracture to the second metatarsal of the right foot.  Patient states that he has been transitioning out of the cam boot.  Overall he is doing very well.  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   Fatty liver    on u/s 07/2018   Hyperlipidemia 03/2004   Hypertension 1994   Multiple myeloma ([place])    Snores    Past Surgical History:  Procedure Laterality Date   CATARACT EXTRACTION Left 10/10/2021   CATARACT EXTRACTION W/PHACO Right 03/04/2018   Procedure: CATARACT EXTRACTION PHACO AND INTRAOCULAR LENS PLACEMENT ([place])  RIGHT TORIC;  Surgeon: Leandrew Koyanagi, MD;  Location: [facility];  Service: Ophthalmology;  Laterality: Right;  Per [place] no Toric Lens 1:45 5.3   CATARACT EXTRACTION W/PHACO Left 10/10/2021   Procedure: CATARACT EXTRACTION PHACO AND INTRAOCULAR LENS PLACEMENT ([place]) LEFT;  Surgeon: Leandrew Koyanagi, MD;  Location: [facility];  Service: Ophthalmology;  Laterality: Left;  5.16 1:03.8   COLONOSCOPY WITH PROPOFOL N/A 07/20/2018   Procedure: COLONOSCOPY WITH PROPOFOL;  Surgeon: Jonathon Bellows, MD;  [facility] END[facility]COPY;  Service: Gastroenterology;  Laterality: N/A;   ESOPHAGOGASTRODUODENOSCOPY (EGD) WITH PROPOFOL N/A 07/20/2018   Procedure: ESOPHAGOGASTRODUODENOSCOPY (EGD) WITH PROPOFOL;  Surgeon: Jonathon Bellows, MD;  Location: [facility]COPY;  Service: Gastroenterology;  Laterality: N/A;   EYE SURGERY Right    HERNIA REPAIR     L Lap herniorraphy  04/2000   Left wrist ganglionectomy     Allergies  Allergen Reactions   Bactrim [Sulfamethoxazole-Trimethoprim]     Elevated creatinine   Clonidine Derivatives     Low platelets, edema   Tadalafil     Intolerant,  Didn't feel well on med     Physical Exam: General: The patient is alert and oriented x3 in no acute distress.  Dermatology: Skin is warm, dry and supple bilateral lower extremities.  Vascular: Palpable pedal pulses bilaterally. No edema or erythema noted. Capillary refill within normal limits.  Neurological: Epicritic and protective threshold grossly intact bilaterally.   Musculoskeletal Exam: No pedal deformities noted.  Today there is no tenderness to palpation along the second metatarsal  Radiographic Exam:  Normal osseous mineralization. Joint spaces preserved.  Subtle stress reaction fracture noted to the second metatarsal of the right foot with periosteal callus formation.  The fracture appears to be healing routinely.  There is improvement since last x-rays taken  Assessment: 1.  Stress reaction fracture second metatarsal right with routine healing  Plan of Care:  1. Patient evaluated. X-Rays reviewed today and compared with x-rays taken last visit.  2.  Overall the patient is doing very well.  He can discontinue the cam boot now.  Slowly increase activity over the next 4 weeks. 3.  I explained to the patient that the fracture has not healed completely however it demonstrates signs of good healing approximately 80%. 4.  Return to clinic as needed      Edrick Kins, DPM [facility]  Dr. Edrick Kins, DPM    [address]  [place], [place] [address]                Office [phone]  Fax [phone]

## 2021-11-03 ENCOUNTER — Other Ambulatory Visit: Payer: Self-pay | Admitting: Family Medicine

## 2021-11-06 ENCOUNTER — Other Ambulatory Visit: Payer: Self-pay

## 2021-11-06 ENCOUNTER — Other Ambulatory Visit: Payer: Medicare Other

## 2021-11-06 DIAGNOSIS — R972 Elevated prostate specific antigen [PSA]: Secondary | ICD-10-CM

## 2021-11-07 LAB — PSA: Prostate Specific Ag, Serum: 5.2 ng/mL — ABNORMAL HIGH (ref 0.0–4.0)

## 2021-11-08 ENCOUNTER — Inpatient Hospital Stay (HOSPITAL_BASED_OUTPATIENT_CLINIC_OR_DEPARTMENT_OTHER): Payer: Medicare Other | Admitting: Oncology

## 2021-11-08 ENCOUNTER — Encounter: Payer: Self-pay | Admitting: Oncology

## 2021-11-08 ENCOUNTER — Other Ambulatory Visit: Payer: Self-pay

## 2021-11-08 ENCOUNTER — Inpatient Hospital Stay: Payer: Medicare Other

## 2021-11-08 ENCOUNTER — Other Ambulatory Visit: Payer: Medicare Other

## 2021-11-08 ENCOUNTER — Ambulatory Visit: Payer: Medicare Other | Admitting: Oncology

## 2021-11-08 VITALS — BP 133/88 | HR 68 | Temp 97.3°F | Resp 16 | Wt 206.0 lb

## 2021-11-08 VITALS — BP 137/76 | HR 62 | Resp 18

## 2021-11-08 DIAGNOSIS — Z5111 Encounter for antineoplastic chemotherapy: Secondary | ICD-10-CM

## 2021-11-08 DIAGNOSIS — N1831 Chronic kidney disease, stage 3a: Secondary | ICD-10-CM

## 2021-11-08 DIAGNOSIS — C9002 Multiple myeloma in relapse: Secondary | ICD-10-CM

## 2021-11-08 DIAGNOSIS — Z5112 Encounter for antineoplastic immunotherapy: Secondary | ICD-10-CM | POA: Diagnosis not present

## 2021-11-08 DIAGNOSIS — D696 Thrombocytopenia, unspecified: Secondary | ICD-10-CM | POA: Diagnosis not present

## 2021-11-08 DIAGNOSIS — M899 Disorder of bone, unspecified: Secondary | ICD-10-CM

## 2021-11-08 DIAGNOSIS — C9000 Multiple myeloma not having achieved remission: Secondary | ICD-10-CM

## 2021-11-08 LAB — CBC WITH DIFFERENTIAL/PLATELET
Abs Immature Granulocytes: 0.02 10*3/uL (ref 0.00–0.07)
Basophils Absolute: 0 10*3/uL (ref 0.0–0.1)
Basophils Relative: 1 %
Eosinophils Absolute: 0.2 10*3/uL (ref 0.0–0.5)
Eosinophils Relative: 4 %
HCT: 39.4 % (ref 39.0–52.0)
Hemoglobin: 13.4 g/dL (ref 13.0–17.0)
Immature Granulocytes: 1 %
Lymphocytes Relative: 32 %
Lymphs Abs: 1.4 10*3/uL (ref 0.7–4.0)
MCH: 34 pg (ref 26.0–34.0)
MCHC: 34 g/dL (ref 30.0–36.0)
MCV: 100 fL (ref 80.0–100.0)
Monocytes Absolute: 0.8 10*3/uL (ref 0.1–1.0)
Monocytes Relative: 18 %
Neutro Abs: 2 10*3/uL (ref 1.7–7.7)
Neutrophils Relative %: 44 %
Platelets: 89 10*3/uL — ABNORMAL LOW (ref 150–400)
RBC: 3.94 MIL/uL — ABNORMAL LOW (ref 4.22–5.81)
RDW: 14.4 % (ref 11.5–15.5)
WBC: 4.4 10*3/uL (ref 4.0–10.5)
nRBC: 0 % (ref 0.0–0.2)

## 2021-11-08 LAB — COMPREHENSIVE METABOLIC PANEL
ALT: 17 U/L (ref 0–44)
AST: 15 U/L (ref 15–41)
Albumin: 4.5 g/dL (ref 3.5–5.0)
Alkaline Phosphatase: 58 U/L (ref 38–126)
Anion gap: 8 (ref 5–15)
BUN: 22 mg/dL (ref 8–23)
CO2: 25 mmol/L (ref 22–32)
Calcium: 9.4 mg/dL (ref 8.9–10.3)
Chloride: 103 mmol/L (ref 98–111)
Creatinine, Ser: 1.32 mg/dL — ABNORMAL HIGH (ref 0.61–1.24)
GFR, Estimated: 60 mL/min — ABNORMAL LOW (ref >60)
Glucose, Bld: 117 mg/dL — ABNORMAL HIGH (ref 70–99)
Potassium: 3.9 mmol/L (ref 3.5–5.1)
Sodium: 136 mmol/L (ref 135–145)
Total Bilirubin: 1.2 mg/dL (ref 0.3–1.2)
Total Protein: 7.2 g/dL (ref 6.5–8.1)

## 2021-11-08 MED ORDER — DIPHENHYDRAMINE HCL 25 MG PO CAPS
50.0000 mg | ORAL_CAPSULE | Freq: Once | ORAL | Status: AC
  Administered 2021-11-08: 50 mg via ORAL
  Filled 2021-11-08: qty 2

## 2021-11-08 MED ORDER — ACETAMINOPHEN 325 MG PO TABS
650.0000 mg | ORAL_TABLET | Freq: Once | ORAL | Status: AC
  Administered 2021-11-08: 650 mg via ORAL
  Filled 2021-11-08: qty 2

## 2021-11-08 MED ORDER — DARATUMUMAB-HYALURONIDASE-FIHJ 1800-30000 MG-UT/15ML ~~LOC~~ SOLN
1800.0000 mg | Freq: Once | SUBCUTANEOUS | Status: AC
  Administered 2021-11-08: 1800 mg via SUBCUTANEOUS
  Filled 2021-11-08: qty 15

## 2021-11-08 NOTE — Progress Notes (Signed)
Pt in for follow up, reports not having to wear boot on right foot since last week follow up.

## 2021-11-08 NOTE — Progress Notes (Addendum)
Hematology/Oncology  Follow up note [phone] [phone]   Patient Care Team: Tonia Ghent, MD as PCP - General (Family Medicine) Earlie Server, MD as Consulting Physician (Oncology) Beather Arbour Lilyan Punt, MD as Referring Physician (Hematology and Oncology) Diannia Ruder, MD as Referring Physician (Hematology and Oncology)  REASON FOR VISIT Follow up for multiple myeloma.   HISTORY OF PRESENTING ILLNESS:  Luis Burke is a  66 y.o.  male with PMH listed below presents for follow up of multiple myeloma.  # 07/27/2018 multiple myeloma panel showed M protein of 0.1, IgG 662, IgA 49, IgM 7. Patient was called back to further labs done. 08/10/2018, free light chain ratio showed extremely high level of kappa free light chain 10,183, with a kappa lambda light chain ratio of 1414.31 LDH 164 Beta-2 microglobulin 5 Patient was called and discuss about results.  He was recommended to undergo bone marrow biopsy and PET scan. 08/19/2018 bone marrow biopsy showed hypercellular marrow 80%, involved by plasma cell neoplasm up to 95%.  Consistent with plasma cell myeloma. Myeloma FISH  gain of gain of CEP12  09/10/2018 skeletal survey showed questionable small lucent lesions within the midshaft of the humerus bilaterally.  Otherwise no suspicious focal lytic lesion or acute bone abnormality. 08/25/2018 PET scan showed no definite hypermetabolic bone disease but CT findings are highly suspicious for numerous small myelomatous lesions involving the spine, sternum and scattered ribs.  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 He received preparative regimen with melphalan 200 mg/m on 04/22/2019 followed by autologous stem cell infusion on 04/23/2019. Currently he is transfusion independent.  Transfusion criteria with as needed hemoglobin less than 7.5 for platelet less than 10,000.  Transplant course was complicated with febrile neutropenia grade 3,  treated with vancomycin and cephapirin until engraftment on 05/06/2019. Patient also had engraftment syndrome and had to be started on Solu-Medrol 25 mg twice daily on 05/05/2019.  Transition to Medrol Dosepak on 05/07/2019 to complete steroid taper as outpatient.  Patient is currently on acyclovir for 1 year after transplant, dose was reduced on 722 11/20/2018 due to renal function. Patient had a baseline CKD with creatinine running between 1.4-1.6.  He had acute on chronic kidney injury and creatinine went up to 2.2 on 722.  Fluid hydration was given and creatinine decreased to baseline 1.5-1.7. His Hickman catheter was removed.  Candida Cruris treated with topical clotrimazole 1% 3 times daily and to resolution..  #  seen by Dr. Tonia Brooms BMT team on 06/30/2019.revaccination at [place] begins at 12 months post transplant I discussed with [facility] hematology Dr.Choi and he recommends plans as listed below.  -patient to be started on Ixazomib maintenance: -Ixazomib 58m on D1/D8/D15 out of 28 day cycle.  -patient will start re-vaccination 12 months post transplant.  -Post transplant bone marrow biopsy if clinically indicated.  # seen by [facility] bone marrow transplant team in June 2021.  Patient has been started on immunization protocol.  He reports no new complaints.  #[facility]  Lucent lesion in the L4 spinous process measuring 4 mm which is  nonspecific. Consider confirmation of marrow replacing process with MRI.  2.  Incompletely characterized renal cysts   # He continues to have chronic back pain and neck pain.  [facility], MRI lumbar spine with and without contrast showed no evidence of spinal metastasis.  Lumbar spondylosis is most pronounced at L5-S1 where  there is severe right and moderate left neural foraminal stenosis. 08/11/2020, x-ray cervical spine complete with flexion and extension showed The cervical spine is visualized from C1 to the  top of T1 on the lateral  view.  Reversal of the normal cervical lordosis with a focal kyphosis centered at the C5-C6 intervertebral level. Trace anterolisthesis of C4 onto C5. Trace retrolisthesis of C6 on C7. No evidence of dynamic instability is noted on  the flexion or extension views.   No prevertebral soft tissue swelling. Cervical vertebral body heights are maintained. Multilevel degenerative changes of the cervical spine including discogenic disease, most pronounced at C5-C6 and C6-C7, and facet arthropathy most pronounced at C4-C5. Multilevel mild to moderate left neural foraminal osseous narrowing of the mid to lower cervical vertebrae, possibly centrally/artifactual by patient positioning.  #07/28/2020 bone marrow normocellular marrow with polytypic plasmacytosis, 3 to 8%   Patient is in remission.  Cytogenetics negative for trisomy 12.  Negative myeloma FISH panel.   # Lumbar lesion,  11/13/20 [facility]  MRI cervical sine w/wo contrast - no evidence of metastatic disease C3-4 severe spinal stenosis, left neural foraminal narrowing. C4-5 spinal canal stenosis. [facility] MRI results showed no metastatic spine lesion. Chronic back pain and neck pain, images are consistent with chronic degenerative disease.  Patient continues to follow-up with [place] orthopedic surgeon.  04/03/2021, light chain ratio increased to 3.76.   05/15/2021 bone marrow biopsy showed slightly hypercellular bone marrow for age with slight plasmacytosis.  5% plasma cells with kappa light chain restriction.  Cytogenetics normal.  Myeloma FISH negative # 05/15/2021 bone marrow biopsy showed slightly hypercellular bone marrow for age with slight plasmacytosis.  5% plasma cells with kappa light chain restriction.  Cytogenetics normal.  Myeloma FISH negative  # 05/31/2021 Daratumumab -Pomalyst-dexamethasone-D1 patient had grade 2 infusion reaction to daratumumab and received steroids, Benadryl, famotidine.  He was able to finish treatment.D8  was able to finish Daratumumab with no infusion reactions.  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  Patient follows with podiatry Dr. Amalia Hailey for stress reaction fracture of the second metatarsal right.  He was cleared by Dr. Amalia Hailey to go back to [place].    08/16/21 Skeletal survey showed stable small right humeral and scapular lucencies.  Ill-defined small lucencies in the proximal right humeral shaft more conspicuous than on prior study of 08/10/2018. 09/21/2021, PET scan showed no evidence of suspicious or new hypermetabolism, scattered small lucent foci seen previously in the sternum and the spine are less prominent today and have resolved in some regions.  Aortic atherosclerosis.  INTERVAL HISTORY KEAGAN BRISLIN is a 66 y.o. male who has above history reviewed by me today for follow-up  Currently on Daratumumab Pomalyst dexamethasone Patient reports feeling well. No new complaints.  Denies fever, chills, wheezing shortness of breath   Review of Systems  Constitutional:  Negative for appetite change, chills, fatigue, fever and unexpected weight change.  HENT:   Negative for hearing loss.   Eyes:  Negative for eye problems and icterus.  Respiratory:  Negative for chest tightness, cough and shortness of breath.   Cardiovascular:  Negative for chest pain and leg swelling.  Gastrointestinal:  Negative for abdominal distention and abdominal pain.  Endocrine: Negative for hot flashes.  Genitourinary:  Negative for difficulty urinating, dysuria and frequency.   Musculoskeletal:  Positive for back pain and neck pain. Negative for arthralgias.  Skin:  Negative for itching and rash.  Neurological:  Negative for light-headedness and numbness.  Hematological:  Negative  for adenopathy. Does not bruise/bleed easily.  Psychiatric/Behavioral:  Negative for confusion.    MEDICAL HISTORY:  Past Medical History:  Diagnosis Date   AKI (acute kidney injury) ([place]) 01/06/2019   Anemia    Bone marrow  transplant status ([place])    autologous stem cell bone marrow transplant on 04/23/2019.   Fatty liver    on u/s 07/2018   Hyperlipidemia 03/2004   Hypertension 1994   Multiple myeloma ([place])    Snores     SURGICAL HISTORY: Past Surgical History:  Procedure Laterality Date   CATARACT EXTRACTION Left 10/10/2021   CATARACT EXTRACTION W/PHACO Right 03/04/2018   Procedure: CATARACT EXTRACTION PHACO AND INTRAOCULAR LENS PLACEMENT ([place])  RIGHT TORIC;  Surgeon: Leandrew Koyanagi, MD;  Location: [facility];  Service: Ophthalmology;  Laterality: Right;  Per [place] no Toric Lens 1:45 5.3   CATARACT EXTRACTION W/PHACO Left 10/10/2021   Procedure: CATARACT EXTRACTION PHACO AND INTRAOCULAR LENS PLACEMENT ([place]) LEFT;  Surgeon: Leandrew Koyanagi, MD;  Location: [facility];  Service: Ophthalmology;  Laterality: Left;  5.16 1:03.8   COLONOSCOPY WITH PROPOFOL N/A 07/20/2018   Procedure: COLONOSCOPY WITH PROPOFOL;  Surgeon: Jonathon Bellows, MD;  Location[facility] ENDOSCOPY;  Service: Gastroenterology;  Laterality: N/A;   ESOPHAGOGASTRODUODENOSCOPY (EGD) WITH PROPOFOL N/A 07/20/2018   Procedure: ESOPHAGOGASTRODUODENOSCOPY (EGD) WITH PROPOFOL;  Surgeon: Jonathon Bellows, MD;  Location: [facility] ENDOSCOPY;  Service: Gastroenterology;  Laterality: N/A;   EYE SURGERY Right    HERNIA REPAIR     L Lap herniorraphy  04/2000   Left wrist ganglionectomy      SOCIAL HISTORY: Social History   Socioeconomic History   Marital status: Married    Spouse name: Not on file   Number of children: 1   Years of education: Not on file   Highest education level: Not on file  Occupational History   Occupation: [employer]  Tobacco Use   Smoking status: Never   Smokeless tobacco: Never   Tobacco comments:    Occassionally  Vaping Use   Vaping Use: Never used  Substance and Sexual Activity   Alcohol use: Not Currently    Alcohol/week: 3.0 standard drinks    Types: 3 Cans of beer per week   Drug use: No    Sexual activity: Not on file  Other Topics Concern   Not on file  Social History Narrative   Divorced 2009, married 2021/06/23.     His daughter died 4 days after giving birth to the patient's granddaughter   Has joint custody of his deceased daughter's child   Worked at [employer] is [place], retired 2020.     Social Determinants of Health   Financial Resource Strain: Not on file  Food Insecurity: Not on file  Transportation Needs: Not on file  Physical Activity: Not on file  Stress: Not on file  Social Connections: Not on file  Intimate Partner Violence: Not on file    FAMILY HISTORY: Family History  Problem Relation Age of Onset   Hypertension Mother    Diabetes Mother    Heart disease Mother        CAD   Dementia Mother    Prostate cancer Father    Hypertension Father    Heart disease Father        MI 02/03   Colon cancer Father    Hypertension Sister    Hypertension Sister    Hypertension Sister     ALLERGIES:  is allergic to bactrim [sulfamethoxazole-trimethoprim], clonidine derivatives, and  tadalafil.  MEDICATIONS:  Current Outpatient Medications  Medication Sig Dispense Refill   acetaminophen (TYLENOL) 325 MG tablet Take 2 tablets (650 mg total) by mouth See admin instructions. 1 hour prior to chemotherapy treatments 30 tablet 0   amLODipine (NORVASC) 5 MG tablet TAKE 2 TABLETS BY MOUTH EVERY DAY 180 tablet 1   ASPIRIN 81 PO Take 81 mg by mouth daily.     B Complex Vitamins (VITAMIN B COMPLEX PO) Take 1 Dose by mouth daily.     chlorhexidine (PERIDEX) 0.12 % solution Use as directed 15 mLs in the mouth or throat 2 (two) times daily. 473 mL 1   co-enzyme Q-10 50 MG capsule Take 50 mg by mouth daily.     [employer] VITAMIN B12 1000 MCG tablet TAKE 1 TABLET BY MOUTH EVERY DAY 90 tablet 3   cyclobenzaprine (FLEXERIL) 5 MG tablet Take 1 tablet (5 mg total) by mouth daily as needed for muscle spasms. (Patient not taking: Reported on 10/11/2021) 30 tablet 0    Denosumab (XGEVA [place]) Inject into the skin every 30 (thirty) days. Last rcvd 09/27/21     dexamethasone (DECADRON) 4 MG tablet Take 5 tablets (20 mg total) by mouth once a week. Take at least 1 hour prior to infusion appt. Take with food. To start on 06/07/21. 20 tablet 3   diphenhydrAMINE (BENADRYL) 50 MG tablet Take 1 tablet (50 mg total) by mouth See admin instructions. Take 1 tablet 1 hour prior to chemotherapy treatments. 30 tablet 0   hydrALAZINE (APRESOLINE) 10 MG tablet TAKE 1 TABLET(10 MG) BY MOUTH THREE TIMES DAILY 270 tablet 1   lisinopril (ZESTRIL) 20 MG tablet TAKE 1 TABLET BY MOUTH EVERY DAY 90 tablet 2   loperamide (IMODIUM) 2 MG capsule Take 1 capsule (2 mg total) by mouth See admin instructions. Take 2 capsules at the onset of diarrhea, then 1 capsule every 2 hours or after every loose bowel movement. Maximum 8 capsules per 24 hours. (Patient not taking: Reported on 10/11/2021) 60 capsule 0   LORazepam (ATIVAN) 1 MG tablet TAKE 1 TABLET (1 MG TOTAL) BY MOUTH EVERY 6 (SIX) HOURS AS NEEDED FOR ANXIETY (FOR NAUSEA)     magnesium chloride (SLOW-MAG) 64 MG TBEC SR tablet Take 1 tablet (64 mg total) by mouth daily. 60 tablet 0   metoprolol succinate (TOPROL-XL) 50 MG 24 hr tablet TAKE 4 TABLETS BY MOUTH EVERY DAY, TAKE WITH OR IMMEDIATELY FOLLOWING A MEAL 360 tablet 2   montelukast (SINGULAIR) 10 MG tablet Take 1 tablet (10 mg total) by mouth See admin instructions. Take 1day prior to chemotherapy 30 tablet 1   ondansetron (ZOFRAN) 8 MG tablet Take 1 tablet (8 mg total) by mouth every 8 (eight) hours as needed. for nausea (Patient not taking: Reported on 10/11/2021) 30 tablet 1   pomalidomide (POMALYST) 2 MG capsule Take 1 capsule (2 mg total) by mouth daily. Take for 21 days on, then hold for 7 days. Repeat every 28 days. 21 capsule 0   pravastatin (PRAVACHOL) 10 MG tablet TAKE 1 TABLET BY MOUTH EVERY DAY 90 tablet 3   prochlorperazine (COMPAZINE) 10 MG tablet  (Patient not taking: Reported on  10/11/2021)     sildenafil (REVATIO) 20 MG tablet Take 1-5 tablets (20-100 mg total) by mouth daily as needed. 50 tablet 12   zinc gluconate 50 MG tablet Take 50 mg by mouth daily.     No current facility-administered medications for this visit.   Facility-Administered Medications Ordered in  Other Visits  Medication Dose Route Frequency Provider Last Rate Last Admin   0.9 %  sodium chloride infusion   Intravenous Once Earlie Server, MD         PHYSICAL EXAMINATION: ECOG PERFORMANCE STATUS: 1 - Symptomatic but completely ambulatory Vitals:   11/08/21 0838  BP: 133/88  Pulse: 68  Resp: 16  Temp: (!) 97.3 F (36.3 C)  SpO2: 96%   Filed Weights   11/08/21 0838  Weight: 206 lb (93.4 kg)    Physical Exam Constitutional:      General: He is not in acute distress. HENT:     Head: Normocephalic and atraumatic.  Eyes:     General: No scleral icterus. Cardiovascular:     Rate and Rhythm: Normal rate and regular rhythm.     Heart sounds: Normal heart sounds.  Pulmonary:     Effort: Pulmonary effort is normal. No respiratory distress.     Breath sounds: No wheezing.  Abdominal:     General: Bowel sounds are normal. There is no distension.     Palpations: Abdomen is soft.  Musculoskeletal:        General: No deformity. Normal range of motion.     Cervical back: Normal range of motion and neck supple.  Skin:    General: Skin is warm and dry.     Findings: No erythema or rash.  Neurological:     Mental Status: He is alert and oriented to person, place, and time. Mental status is at baseline.     Cranial Nerves: No cranial nerve deficit.     Coordination: Coordination normal.  Psychiatric:        Mood and Affect: Mood normal.     LABORATORY DATA:  I have reviewed the data as listed Lab Results  Component Value Date   WBC 3.9 (L) 10/25/2021   HGB 12.7 (L) 10/25/2021   HCT 37.1 (L) 10/25/2021   MCV 100.0 10/25/2021   PLT 115 (L) 10/25/2021   Recent Labs    09/27/21 0855  10/11/21 0818 10/25/21 0758  NA 138 135 135  K 4.5 4.0 4.3  CL 101 99 104  CO2 _0 GLUCOSE 99 107* 102*  BUN _1 CREATININE 1.32* 1.23 1.46*  CALCIUM 9.8 9.4 9.0  GFRNONAA 60* >60 53*  PROT 6.7 6.9 6.8  ALBUMIN 4.3 4.2 4.3  AST _2 ALT _3 ALKPHOS 63 74 60  BILITOT 0.7 0.8 0.7    Iron/TIBC/Ferritin/ %Sat    Component Value Date/Time   IRON 74 07/01/2018 0944   TIBC 250 07/01/2018 0944   FERRITIN 357 07/01/2018 0944   IRONPCTSAT 30 07/01/2018 0944    Lab Results  Component Value Date   TOTALPROTELP 6.2 10/11/2021   ALBUMINELP 4.0 03/08/2021   A1GS 0.2 03/08/2021   A2GS 0.7 03/08/2021   BETS 1.1 03/08/2021   GAMS 1.1 03/08/2021   MSPIKE Not Observed 03/08/2021   SPEI Comment 03/08/2021   Lab Results  Component Value Date   KPAFRELGTCHN 16.8 10/11/2021   LAMBDASER 13.0 10/11/2021   KAPLAMBRATIO 1.29 10/11/2021    RADIOGRAPHIC STUDIES: I have personally reviewed the radiological images as listed and agreed with the findings in the report. NM PET Image Restage (PS) Whole Body (F-18 FDG)  Result Date: 09/21/2021 CLINICAL DATA:  Subsequent treatment strategy for multiple myeloma. EXAM: NUCLEAR MEDICINE PET WHOLE BODY TECHNIQUE: 11.3 mCi F-18 FDG was injected intravenously. Full-ring PET imaging was performed  from the head to foot after the radiotracer. CT data was obtained and used for attenuation correction and anatomic localization. Fasting blood glucose: 138 mg/dl COMPARISON:  08/25/2018 FINDINGS: Mediastinal blood pool activity: SUV max 2.6 HEAD/NECK: No hypermetabolic activity in the scalp. No hypermetabolic cervical lymph nodes. Incidental CT findings: none CHEST: No hypermetabolic mediastinal or hilar nodes. No suspicious pulmonary nodules on the CT scan. Incidental CT findings: Mild atherosclerotic calcification is noted in the wall of the thoracic aorta. Dependent atelectasis noted in the lung bases. ABDOMEN/PELVIS: No abnormal  hypermetabolic activity within the liver, pancreas, adrenal glands, or spleen. No hypermetabolic lymph nodes in the abdomen or pelvis. FDG accumulation in the region of the left groin herniorrhaphy has decreased in the interval with SUV max = 5.6 today compared to 7.6 previously. These findings are likely related to postsurgical change. Incidental CT findings: Bilateral renal cysts are again noted. 5.0 cm interpolar left renal cyst has increased in size from 3.4 cm previously but appears photopenic on PET imaging. There is mild atherosclerotic calcification of the abdominal aorta without aneurysm. SKELETON: No focal hypermetabolic activity to suggest skeletal metastasis. Incidental CT findings: The scattered tiny lucent bone lesion seen on the previous PET-CT involving the sternum, right humeral head, and spine are less prominent today and appear resolved in most locations. No suspicious bony hypermetabolism on today's study. Interval continued further healing of the anterior left second rib fracture identified previously. EXTREMITIES: No abnormal hypermetabolic activity in the lower extremities. Incidental CT findings: none IMPRESSION: 1. No evidence for suspicious or new hypermetabolism on today's study. 2. The scattered small lucent foci seen previously in the sternum and spine are less prominent today and have resolved in some regions. 3.  Aortic Atherosclerois (ICD10-170.0) Electronically Signed   By: Misty Stanley M.D.   On: 09/21/2021 15:25   DG Bone Survey Met  Result Date: 08/17/2021 CLINICAL DATA:  Multiple myeloma, in relapse EXAM: METASTATIC BONE SURVEY COMPARISON:  08/07/2021 and previous FINDINGS: 5 mm lucency in the right scapula medial to the glenoid is stable since 08/10/2018. 11 mm lucency in the mid right humeral shaft stable. Ill-defined lucencies in the proximal right humeral shaft are more conspicuous than on prior study. Scattered degenerative changes including: Cervical degenerative disc  disease C4-C7. Bilateral carotid bifurcation calcified plaque. Bilateral hip  DJD . Progressive degenerative disc disease in the lower thoracic spine, and L5-S1. IMPRESSION: 1. Stable small right humeral and scapular lucencies. 2. Ill-defined smaller lucencies in the proximal right humeral shaft more conspicuous than on prior study of 08/10/2018. 3. Chronic and degenerative changes as above. Electronically Signed   By: Lucrezia Europe M.D.   On: 08/17/2021 09:43   DG Foot Complete Right  Result Date: 10/30/2021 Please see detailed radiograph report in office note.     ASSESSMENT & PLAN:  1. Encounter for antineoplastic chemotherapy   2. Thrombocytopenia ([place])   3. Multiple myeloma in relapse ([facility])   4. Stage 3a chronic kidney disease ([place])   5. Bone lesion    #Light chain multiple myeloma beta 2 microglobulin 5 and normal albumin.  Stage II.cytogenetics showed normal male chromosome, MDS FISH panel showed trisomy 41- Standard Risk.S/p RVD x 9 and  Autologous bone marrow stem cell transplant at [facility] on 04/23/2019.' Early relapse multiple myeloma.  06/04/2021 5% plasma cells Labs are reviewed and discussed with patient Procedure visit cycle 6-day 15 daratumumab.  Continue dexamethasone 20 mg weekly  Finish course of Pomalyst Bone marrow biopsyhas been scheduled for  evaluation of disease status.  #Thrombocytopenia, chemotherapy-induced.  Monitor. #Lucent lesions in the bone. Majority of his lucencies were not hypermetabolic on previous PET scan Resumed on Xgeva today-10/25/2021-Plan to repeat bone marrow biopsy after 6 cycles of Daratumumab/Pomalyst.  #Chronic thrombocytopenia, platelet count is stable.   # CKD, avoid nephrotoxin.  Encourage hydration.  #Neck pain, previous Jan 2022 MRI cervical spine showed DJD.  Check X ray of cervical spine- DJD  physical therapy.  Flexeril 22m daily PRN  Post bone marrow transplant care He has completed 1 year of acyclovir.  Off acyclovir now. [facility]    #Bone health/osteopenia/multiple myeloma-  Continue Xgeva monthly Continue calcium and vitamin D supplementation. Follow up 2 weeks for Lab MD Daratumumab treatment.   ZEarlie Server MD, PhD 11/08/2021

## 2021-11-08 NOTE — Progress Notes (Signed)
Patient tolerated the Darzalex Faspro injection today without any complications. Monitored for 30 minutes after injection as ordered. Vital signs stable at discharge.

## 2021-11-09 LAB — KAPPA/LAMBDA LIGHT CHAINS
Kappa free light chain: 20.7 mg/L — ABNORMAL HIGH (ref 3.3–19.4)
Kappa, lambda light chain ratio: 1.33 (ref 0.26–1.65)
Lambda free light chains: 15.6 mg/L (ref 5.7–26.3)

## 2021-11-13 ENCOUNTER — Other Ambulatory Visit: Payer: Self-pay | Admitting: Family Medicine

## 2021-11-13 LAB — MULTIPLE MYELOMA PANEL, SERUM
Albumin SerPl Elph-Mcnc: 4.1 g/dL (ref 2.9–4.4)
Albumin/Glob SerPl: 1.8 — ABNORMAL HIGH (ref 0.7–1.7)
Alpha 1: 0.2 g/dL (ref 0.0–0.4)
Alpha2 Glob SerPl Elph-Mcnc: 0.7 g/dL (ref 0.4–1.0)
B-Globulin SerPl Elph-Mcnc: 1 g/dL (ref 0.7–1.3)
Gamma Glob SerPl Elph-Mcnc: 0.4 g/dL (ref 0.4–1.8)
Globulin, Total: 2.3 g/dL (ref 2.2–3.9)
IgA: 125 mg/dL (ref 61–437)
IgG (Immunoglobin G), Serum: 497 mg/dL — ABNORMAL LOW (ref 603–1613)
IgM (Immunoglobulin M), Srm: 11 mg/dL — ABNORMAL LOW (ref 20–172)
M Protein SerPl Elph-Mcnc: 0.1 g/dL — ABNORMAL HIGH
Total Protein ELP: 6.4 g/dL (ref 6.0–8.5)

## 2021-11-13 NOTE — Progress Notes (Signed)
11/14/21 9:18 AM   [name] 21-Mar-1956 [id number]  Referring provider:  Tonia Ghent, MD [address] [place],  [place] [address] Chief Complaint  Patient presents with   Elevated PSA     HPI: Luis Burke is a 66 y.o.male with a personal history of elevated PSA and erectile dysfunction who presents today for a 3 month follow-up with PSA prior.   He has multiple myeloma and is followed in oncology by Dr. Earlie Server. Pet scan showed no other underlying malignancies.   Rectal exam on 08/07/2021 showed rubbery nodules that were asymmetric.   His most recent PSA on 11/06/2021 5.2 with a previous PSA of 5.9 on 08/07/2021.   He has a family history of prostate cancer.  He reports today that he has been having to void during the nighttime. He reports that he has been increasing his water intake. He is on chemotherapy shots and wonders if this would help with the prostate.   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   Fatty liver    on u/s 07/2018   Hyperlipidemia 03/2004   Hypertension 1994   Multiple myeloma ([place])    Snores     Surgical History: Past Surgical History:  Procedure Laterality Date   CATARACT EXTRACTION Left 10/10/2021   CATARACT EXTRACTION W/PHACO Right 03/04/2018   Procedure: CATARACT EXTRACTION PHACO AND INTRAOCULAR LENS PLACEMENT ([place])  RIGHT TORIC;  Surgeon: Leandrew Koyanagi, MD;  Location: [facility];  Service: Ophthalmology;  Laterality: Right;  [place] no Toric Lens 1:45 5.3   CATARACT EXTRACTION W/PHACO Left 10/10/2021   Procedure: CATARACT EXTRACTION PHACO AND INTRAOCULAR LENS PLACEMENT ([place]) LEFT;   Surgeon: Leandrew Koyanagi, MD;  Location: [facility];  Service: Ophthalmology;  Laterality: Left;  5.16 1:03.8   COLONOSCOPY WITH PROPOFOL N/A 07/20/2018   Procedure: COLONOSCOPY WITH PROPOFOL;  Surgeon: Jonathon Bellows, MD;  Location: [facility] ENDOSCOPY;  Service: Gastroenterology;  Laterality: N/A;   ESOPHAGOGASTRODUODENOSCOPY (EGD) WITH PROPOFOL N/A 07/20/2018   Procedure: ESOPHAGOGASTRODUODENOSCOPY (EGD) WITH PROPOFOL;  Surgeon: Jonathon Bellows, MD;  Location: [facility] ENDOSCOPY;  Service: Gastroenterology;  Laterality: N/A;   EYE SURGERY Right    HERNIA REPAIR     L Lap herniorraphy  04/2000   Left wrist ganglionectomy      Home Medications:  Allergies as of 11/14/2021       Reactions   Bactrim [sulfamethoxazole-trimethoprim]    Elevated creatinine   Clonidine Derivatives    Low platelets, edema   Tadalafil    Intolerant,  Didn't feel well on med        Medication List        Accurate as of November 14, 2021  9:18 AM. If you have any questions, ask your nurse or doctor.          acetaminophen 325 MG tablet Commonly known as: TYLENOL Take 2 tablets (650 mg total) by mouth See admin instructions. 1 hour prior to chemotherapy treatments   amLODipine 5 MG tablet Commonly known as: NORVASC TAKE 2 TABLETS BY MOUTH EVERY DAY   ASPIRIN 81 PO Take 81 mg by mouth  daily.   chlorhexidine 0.12 % solution Commonly known as: Peridex Use as directed 15 mLs in the mouth or throat 2 (two) times daily.   co-enzyme Q-10 50 MG capsule Take 50 mg by mouth daily.   [employer] VITAMIN B12 1000 MCG tablet Generic drug: cyanocobalamin TAKE 1 TABLET BY MOUTH EVERY DAY   cyclobenzaprine 5 MG tablet Commonly known as: FLEXERIL Take 1 tablet (5 mg total) by mouth daily as needed for muscle spasms.   dexamethasone 4 MG tablet Commonly known as: DECADRON Take 5 tablets (20 mg total) by mouth once a week. Take at least 1 hour prior to infusion appt. Take with food. To start on 06/07/21.    diphenhydrAMINE 50 MG tablet Commonly known as: BENADRYL Take 1 tablet (50 mg total) by mouth See admin instructions. Take 1 tablet 1 hour prior to chemotherapy treatments.   hydrALAZINE 10 MG tablet Commonly known as: APRESOLINE TAKE 1 TABLET(10 MG) BY MOUTH THREE TIMES DAILY   lisinopril 20 MG tablet Commonly known as: ZESTRIL TAKE 1 TABLET BY MOUTH EVERY DAY   loperamide 2 MG capsule Commonly known as: IMODIUM Take 1 capsule (2 mg total) by mouth See admin instructions. Take 2 capsules at the onset of diarrhea, then 1 capsule every 2 hours or after every loose bowel movement. Maximum 8 capsules per 24 hours.   LORazepam 1 MG tablet Commonly known as: ATIVAN TAKE 1 TABLET (1 MG TOTAL) BY MOUTH EVERY 6 (SIX) HOURS AS NEEDED FOR ANXIETY (FOR NAUSEA)   magnesium chloride 64 MG Tbec SR tablet Commonly known as: SLOW-MAG Take 1 tablet (64 mg total) by mouth daily.   metoprolol succinate 50 MG 24 hr tablet Commonly known as: TOPROL-XL TAKE 4 TABLETS BY MOUTH EVERY DAY, TAKE WITH OR IMMEDIATELY FOLLOWING A MEAL   montelukast 10 MG tablet Commonly known as: Singulair Take 1 tablet (10 mg total) by mouth See admin instructions. Take 1day prior to chemotherapy   ondansetron 8 MG tablet Commonly known as: ZOFRAN Take 1 tablet (8 mg total) by mouth every 8 (eight) hours as needed. for nausea   pomalidomide 2 MG capsule Commonly known as: POMALYST Take 1 capsule (2 mg total) by mouth daily. Take for 21 days on, then hold for 7 days. Repeat every 28 days.   pravastatin 10 MG tablet Commonly known as: PRAVACHOL TAKE 1 TABLET BY MOUTH EVERY DAY   prochlorperazine 10 MG tablet Commonly known as: COMPAZINE   sildenafil 20 MG tablet Commonly known as: REVATIO Take 1-5 tablets (20-100 mg total) by mouth daily as needed.   VITAMIN B COMPLEX PO Take 1 Dose by mouth daily.   XGEVA [place] Inject into the skin every 30 (thirty) days. Last rcvd 09/27/21   zinc gluconate 50 MG  tablet Take 50 mg by mouth daily.        Allergies:  Allergies  Allergen Reactions   Bactrim [Sulfamethoxazole-Trimethoprim]     Elevated creatinine   Clonidine Derivatives     Low platelets, edema   Tadalafil     Intolerant,  Didn't feel well on med    Family History: Family History  Problem Relation Age of Onset   Hypertension Mother    Diabetes Mother    Heart disease Mother        CAD   Dementia Mother    Prostate cancer Father    Hypertension Father    Heart disease Father        MI 02/03   Colon cancer  Father    Hypertension Sister    Hypertension Sister    Hypertension Sister     Social History:  reports that he has never smoked. He has never used smokeless tobacco. He reports that he does not currently use alcohol after a past usage of about 3.0 standard drinks per week. He reports that he does not use drugs.   Physical Exam: BP (!) 153/101    Pulse 75    Ht 5' 10"  (1.778 m)    Wt 206 lb (93.4 kg)    BMI 29.56 kg/m   Constitutional:  Alert and oriented, No acute distress. HEENT: [place] AT, moist mucus membranes.  Trachea midline, no masses. Cardiovascular: No clubbing, cyanosis, or edema. Respiratory: Normal respiratory effort, no increased work of breathing. Skin: No rashes, bruises or suspicious lesions. Neurologic: Grossly intact, no focal deficits, moving all 4 extremities. Psychiatric: Normal mood and affect.  Laboratory Data: Lab Results  Component Value Date   CREATININE 1.32 (H) 11/08/2021     Assessment & Plan:    Elevated PSA  -PSA has trended back down but remains mildly stably elevated, unchanged over the last 4 months which is reassuring - We discussed differential diagnosis of BPH versus prostate cancer  -Although most recent PET scan not specific for prostate cancer, there is no hypermetabolic activity in the prostate or in the pelvis and as such, this is somewhat reassuring -In light of other medical comorbidities, he is most  interested in a more conservative approach which seems reasonable - Give his unknown PSA history we will repeat PSA in 6 months; if PSA remains elevated would further recommend and MRI of prostate  - PSA; pending  Return in  6 months for PSA only and in 1 year for follow-up with PSA/ DRE.  Gevena Mart Littlejohn,acting as a scribe for Hollice Espy, MD.,have documented all relevant documentation on the behalf of Hollice Espy, MD,as directed by  Hollice Espy, MD while in the presence of Hollice Espy, MD.  I have reviewed the above documentation for accuracy and completeness, and I agree with the above.   Hollice Espy, MD   [facility] [address] [place], [place] [address] [phone]

## 2021-11-14 ENCOUNTER — Other Ambulatory Visit: Payer: Self-pay

## 2021-11-14 ENCOUNTER — Encounter: Payer: Self-pay | Admitting: Urology

## 2021-11-14 ENCOUNTER — Ambulatory Visit (INDEPENDENT_AMBULATORY_CARE_PROVIDER_SITE_OTHER): Payer: Medicare Other | Admitting: Urology

## 2021-11-14 VITALS — BP 153/101 | HR 75 | Ht 70.0 in | Wt 206.0 lb

## 2021-11-14 DIAGNOSIS — R972 Elevated prostate specific antigen [PSA]: Secondary | ICD-10-CM

## 2021-11-15 ENCOUNTER — Other Ambulatory Visit: Payer: Self-pay | Admitting: *Deleted

## 2021-11-15 ENCOUNTER — Other Ambulatory Visit: Payer: Self-pay | Admitting: Family Medicine

## 2021-11-15 DIAGNOSIS — C9000 Multiple myeloma not having achieved remission: Secondary | ICD-10-CM

## 2021-11-15 MED ORDER — POMALIDOMIDE 2 MG PO CAPS: 2.0000 mg | ORAL_CAPSULE | Freq: Every day | ORAL | 0 refills | Status: DC

## 2021-11-16 ENCOUNTER — Encounter: Payer: Self-pay | Admitting: Oncology

## 2021-11-16 NOTE — Progress Notes (Signed)
Patient on schedule for BMB 11/20/2021, called and spoke with patient with pre procedure instructions given. Made aware to be here @ 0745, NPO after MN, as well as driver post procedure/recovery/discharge. Stated understanding.

## 2021-11-18 ENCOUNTER — Emergency Department: Payer: Medicare Other

## 2021-11-18 ENCOUNTER — Other Ambulatory Visit: Payer: Self-pay

## 2021-11-18 ENCOUNTER — Emergency Department
Admission: EM | Admit: 2021-11-18 | Discharge: 2021-11-18 | Disposition: A | Discharge status: Home / Self Care | Payer: Medicare Other | Attending: Emergency Medicine | Admitting: Emergency Medicine

## 2021-11-18 DIAGNOSIS — C9000 Multiple myeloma not having achieved remission: Secondary | ICD-10-CM

## 2021-11-18 DIAGNOSIS — C9001 Multiple myeloma in remission: Secondary | ICD-10-CM | POA: Diagnosis not present

## 2021-11-18 DIAGNOSIS — D849 Immunodeficiency, unspecified: Secondary | ICD-10-CM | POA: Insufficient documentation

## 2021-11-18 DIAGNOSIS — M542 Cervicalgia: Secondary | ICD-10-CM | POA: Diagnosis present

## 2021-11-18 DIAGNOSIS — M25512 Pain in left shoulder: Secondary | ICD-10-CM | POA: Diagnosis not present

## 2021-11-18 DIAGNOSIS — M5412 Radiculopathy, cervical region: Secondary | ICD-10-CM | POA: Diagnosis not present

## 2021-11-18 DIAGNOSIS — R52 Pain, unspecified: Secondary | ICD-10-CM

## 2021-11-18 LAB — CBC WITH DIFFERENTIAL/PLATELET
Abs Immature Granulocytes: 0.01 10*3/uL (ref 0.00–0.07)
Basophils Absolute: 0 10*3/uL (ref 0.0–0.1)
Basophils Relative: 1 %
Eosinophils Absolute: 0 10*3/uL (ref 0.0–0.5)
Eosinophils Relative: 1 %
HCT: 35.5 % — ABNORMAL LOW (ref 39.0–52.0)
Hemoglobin: 11.8 g/dL — ABNORMAL LOW (ref 13.0–17.0)
Immature Granulocytes: 0 %
Lymphocytes Relative: 31 %
Lymphs Abs: 1.1 10*3/uL (ref 0.7–4.0)
MCH: 33.4 pg (ref 26.0–34.0)
MCHC: 33.2 g/dL (ref 30.0–36.0)
MCV: 100.6 fL — ABNORMAL HIGH (ref 80.0–100.0)
Monocytes Absolute: 0.7 10*3/uL (ref 0.1–1.0)
Monocytes Relative: 19 %
Neutro Abs: 1.7 10*3/uL (ref 1.7–7.7)
Neutrophils Relative %: 48 %
Platelets: 105 10*3/uL — ABNORMAL LOW (ref 150–400)
RBC: 3.53 MIL/uL — ABNORMAL LOW (ref 4.22–5.81)
RDW: 14.6 % (ref 11.5–15.5)
WBC: 3.5 10*3/uL — ABNORMAL LOW (ref 4.0–10.5)
nRBC: 0 % (ref 0.0–0.2)

## 2021-11-18 LAB — BASIC METABOLIC PANEL
Anion gap: 6 (ref 5–15)
BUN: 24 mg/dL — ABNORMAL HIGH (ref 8–23)
CO2: 23 mmol/L (ref 22–32)
Calcium: 9 mg/dL (ref 8.9–10.3)
Chloride: 108 mmol/L (ref 98–111)
Creatinine, Ser: 1.31 mg/dL — ABNORMAL HIGH (ref 0.61–1.24)
GFR, Estimated: >60 mL/min (ref >60)
Glucose, Bld: 105 mg/dL — ABNORMAL HIGH (ref 70–99)
Potassium: 4 mmol/L (ref 3.5–5.1)
Sodium: 137 mmol/L (ref 135–145)

## 2021-11-18 IMAGING — MR MR CERVICAL SPINE WO/W CM
5 of 8 series · 27 of 48 positions shown · IV contrast (8ml Gadavist)
Comparison: PET-CT [DATE]. Cervical spine CT [DATE].

CLINICAL DATA: 65-year-old male with multiple myeloma. Neck pain.

EXAM:
MRI CERVICAL SPINE WITHOUT AND WITH CONTRAST
TECHNIQUE: Multiplanar and multiecho pulse sequences of the cervical spine, to
include the craniocervical junction and cervicothoracic junction,
were obtained without and with intravenous contrast.
CONTRAST:  8mL GADAVIST GADOBUTROL 1 MMOL/ML IV SOLN

[Series 5: T2 · sagittal · 3.0mm · 0.62mm/px · 4 of 14 slices shown (1 of 2)]
[im 1/14]
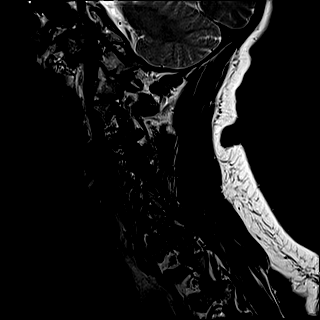
[im 5/14]
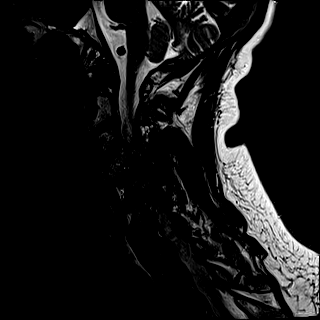
[im 9/14]
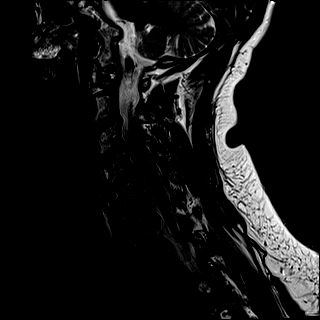
[im 14/14]
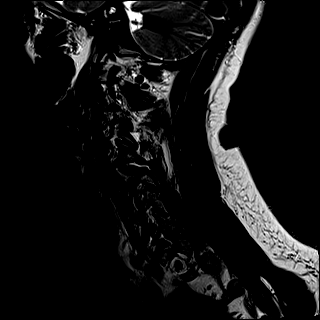

[Series 7: STIR · sagittal · 3.0mm · 0.62mm/px · 4 of 14 slices shown]
[im 1/14]
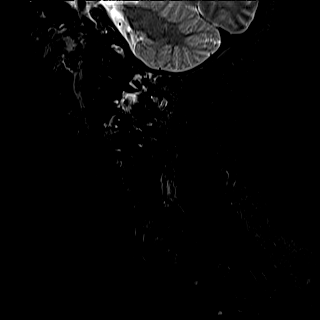
[im 5/14]
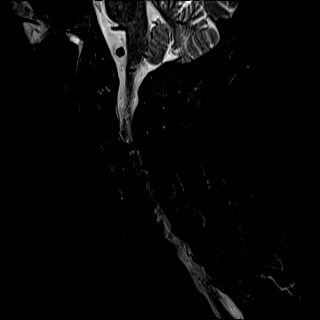
[im 9/14]
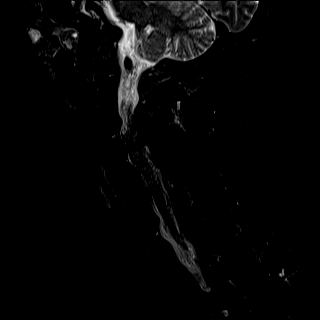
[im 14/14]
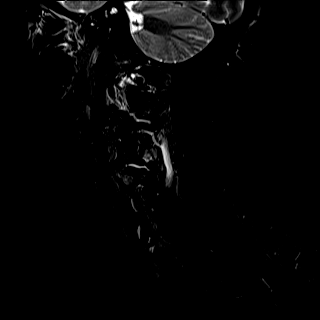

[Series 8: T2 · axial · 3.0mm · 0.70mm/px · z∈[-119,-29]mm · 8 of 29 slices shown (2 of 2)]
[im 1/29]
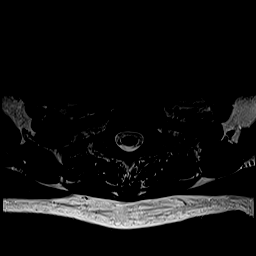
[im 5/29]
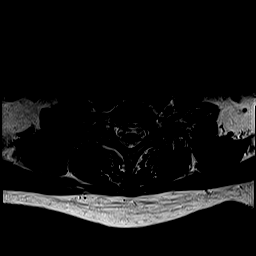
[im 9/29]
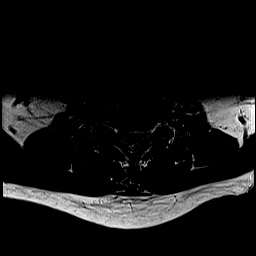
[im 13/29]
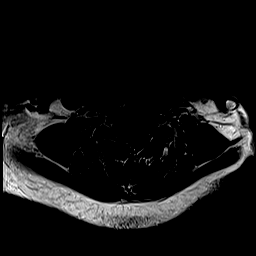
[im 17/29]
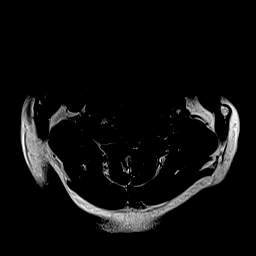
[im 21/29]
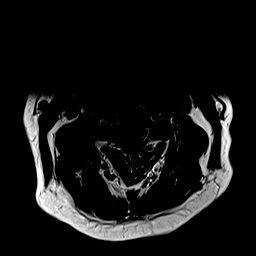
[im 25/29]
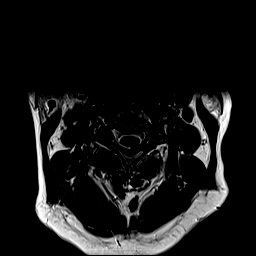
[im 29/29]
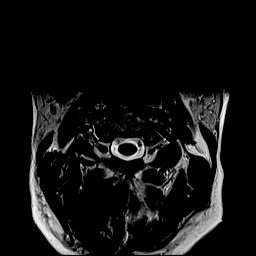

[Series 10: T1 · axial · non-contrast · 3.0mm · 0.35mm/px · z∈[-119,-29]mm · 8 of 29 slices shown]
[im 1/29]
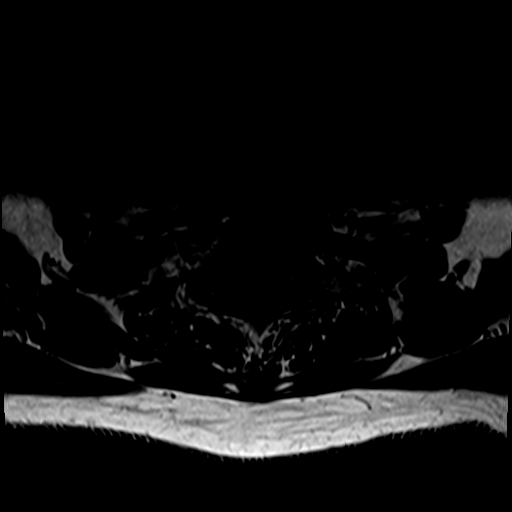
[im 5/29]
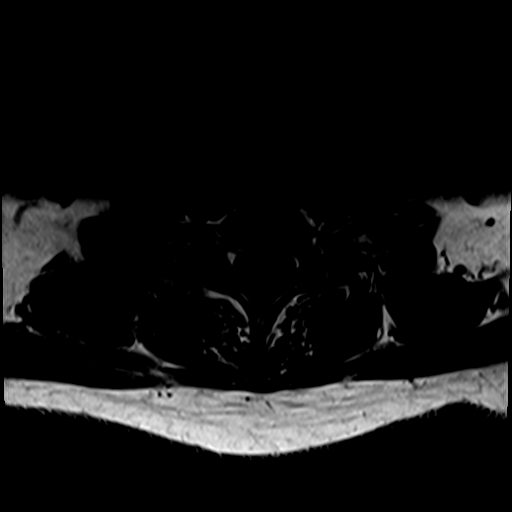
[im 9/29]
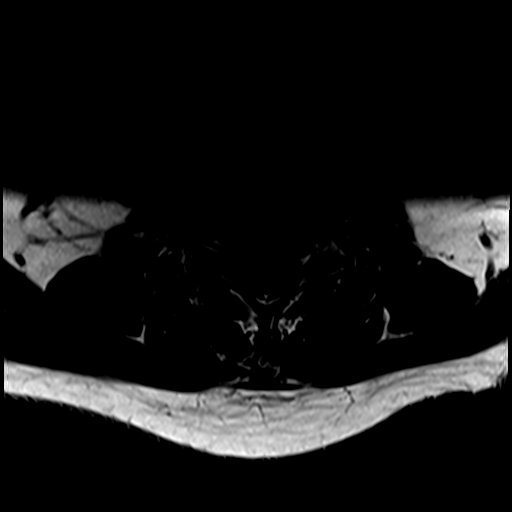
[im 13/29]
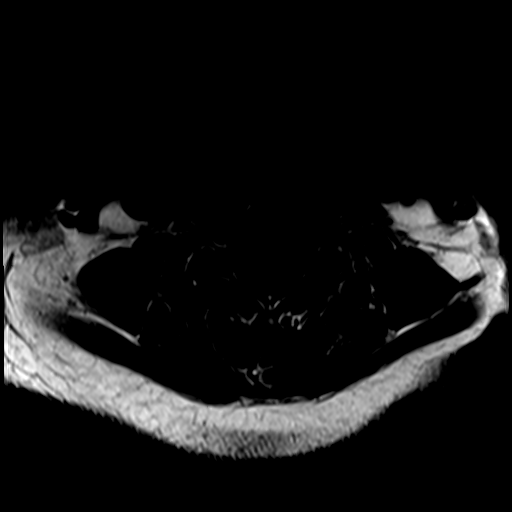
[im 17/29]
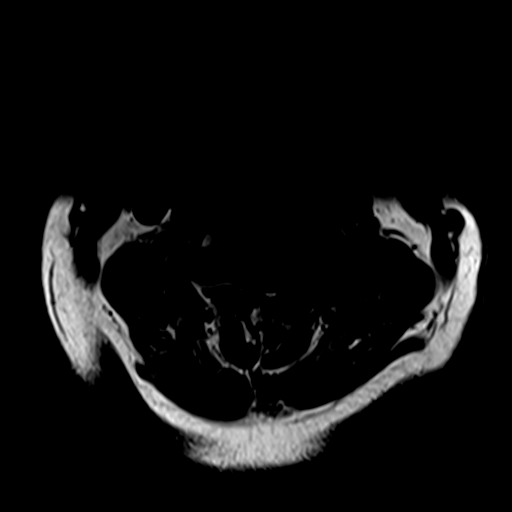
[im 21/29]
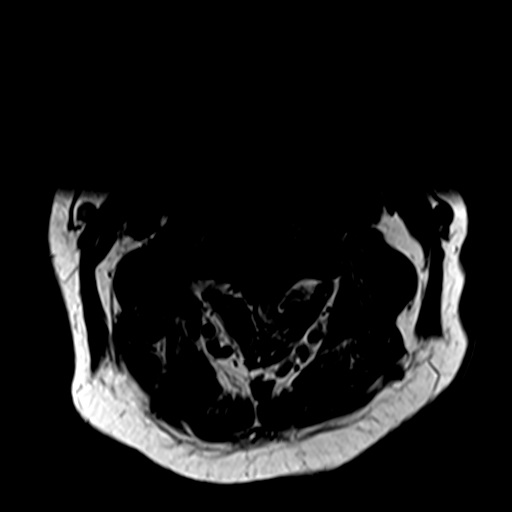
[im 25/29]
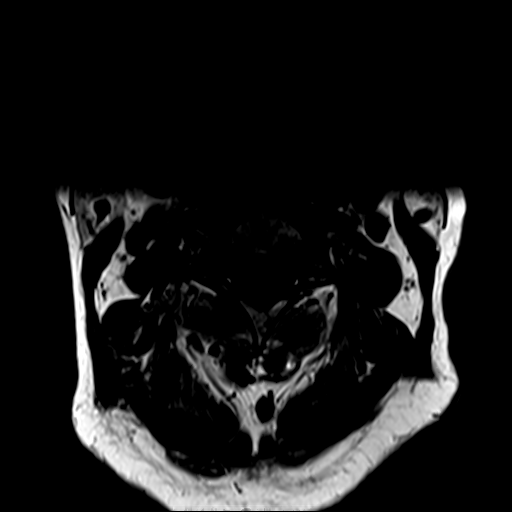
[im 29/29]
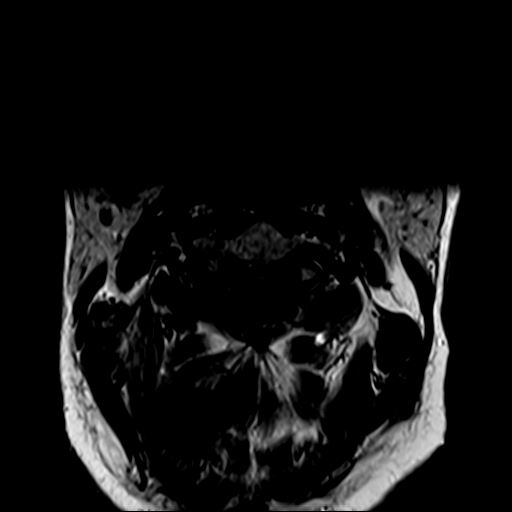

[Series 12: T1 post-contrast · axial · 3.0mm · 0.35mm/px · z∈[-119,-93]mm · 3 of 29 slices shown]
[im 1/29]
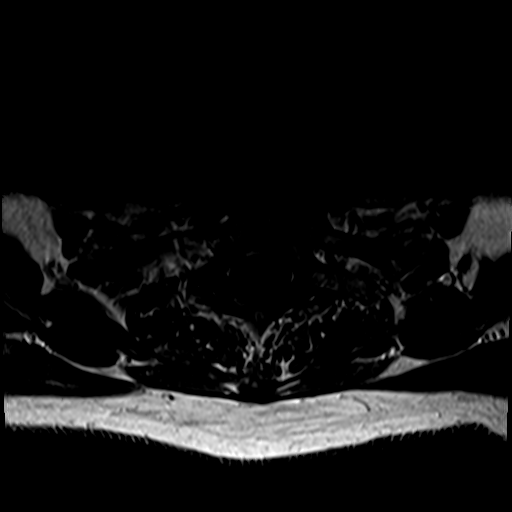
[im 5/29]
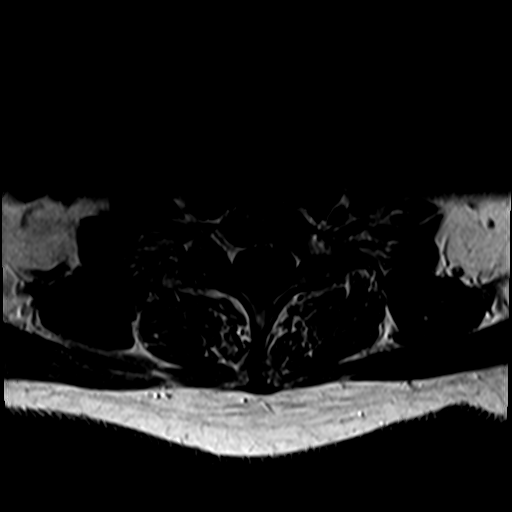
[im 9/29]
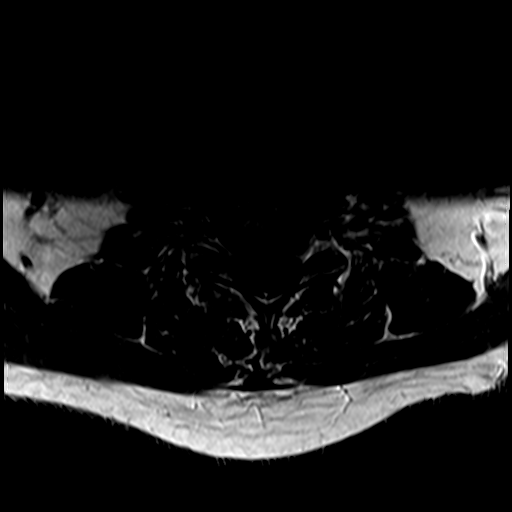

[27 of 48 positions shown; findings below may reference images not displayed]

FINDINGS: Alignment: Chronic straightening of cervical lordosis has not
significantly changed since [KS], including trace retrolisthesis of
C5 on C6.

Vertebrae: Visible bone marrow signal is within normal limits. No
acute or suspicious osseous lesion identified. Pronounced
degenerative osseous changes at the left C4-C5 facet (see below),
C5-C6 endplates.

Cord: No spinal cord signal abnormality despite some degenerative
cord mass effect. No abnormal intradural enhancement. However, there
is circumferential abnormal dural thickening and enhancement at the
C4 and C5 levels (series 12, image 14), and eccentric to the left
and posteriorly at C4 (series 12, image 10). On noncontrast images
the dura and epidural space here is T2 hyperintense and T1
hypointense. And there appears to be asymmetric epidural enhancement
also at the left C4 and C5 neural foramina. See additional details
below.

Posterior Fossa, vertebral arteries, paraspinal tissues:
Cervicomedullary junction is within normal limits. Negative visible
posterior fossa. Preserved major vascular flow voids in the neck.
Patchy STIR hyperintensity in the interspinous ligament at C3-C4,
but no associated enhancement and other visible neck soft tissues
appear negative. Negative visible left lung apex.

Disc levels:

C2-C3: Foraminal disc bulging and endplate spurring greater on the
left. No significant stenosis.

C3-C4: Moderate to severe right side facet hypertrophy. Mild
ligament flavum hypertrophy. Rightward disc bulging and endplate
spurring. Mild spinal stenosis. Up to mild cord mass effect.
Moderate to severe right C4 foraminal stenosis.

C4-C5: Mild-to-moderate right but moderate to severe left facet
hypertrophy. Degenerative appearing left facet joint fluid. Mild
disc bulging and endplate spurring. Mild spinal stenosis when
combined with the abnormal dural thickening and/or epidural space
here. Mild spinal cord mass effect. Moderate left and mild right C5
foraminal stenosis.

C5-C6: Abnormal dural/epidural space continues to this level. Mild
retrolisthesis with disc space loss and circumferential disc
osteophyte complex. Negative facets. Mild spinal stenosis and spinal
cord mass effect. Moderate to severe bilateral C6 foraminal
stenosis.

C6-C7: Disc space loss with circumferential disc osteophyte complex.
No spinal stenosis. Moderate bilateral C7 foraminal stenosis.

C7-T1: Mild anterolisthesis. Moderate facet hypertrophy greater on
the right. Mild endplate spurring. No spinal or significant
foraminal stenosis.

Negative visible upper thoracic levels.
IMPRESSION: 1. No multiple myeloma lesion or marrow edema identified in the
cervical spine. But there is abnormal dural thickening and/or
abnormal epidural space at the C4 and C5 levels. But this is
nonspecific. Trace epidural hematoma is possible. Malignant dural
thickening is difficult to exclude. Degenerative/inflammatory dural
thickening is felt less likely, and there is no strong evidence of
acute spinal infection. Inflammation appears to involve the exiting
left C4 and C5 nerve roots.

2. Underlying cervical spine degeneration, including chronic
moderate to severe facet arthropathy at C3-C4 and C4-C5.
Degenerative appearing facet joint effusion on the left at the
latter.
Multilevel mild degenerative spinal stenosis and spinal cord mass
effect C3-C4 through C5-C6. No spinal cord signal abnormality
identified.
Moderate and occasionally severe severe degenerative neural
foraminal stenosis at the right C4, left C5, bilateral C6 and C7
nerve levels.

## 2021-11-18 IMAGING — CR DG SHOULDER 2+V*L*
1 series · 3 of 3 positions shown · non-contrast
Comparison: None.

CLINICAL DATA: Acute on chronic left shoulder pain.

EXAM:
LEFT SHOULDER - 2+ VIEW

[Series 1: dg shoulder left · 0.14mm/px · 3 of 3 slices shown]
[im 1/3]
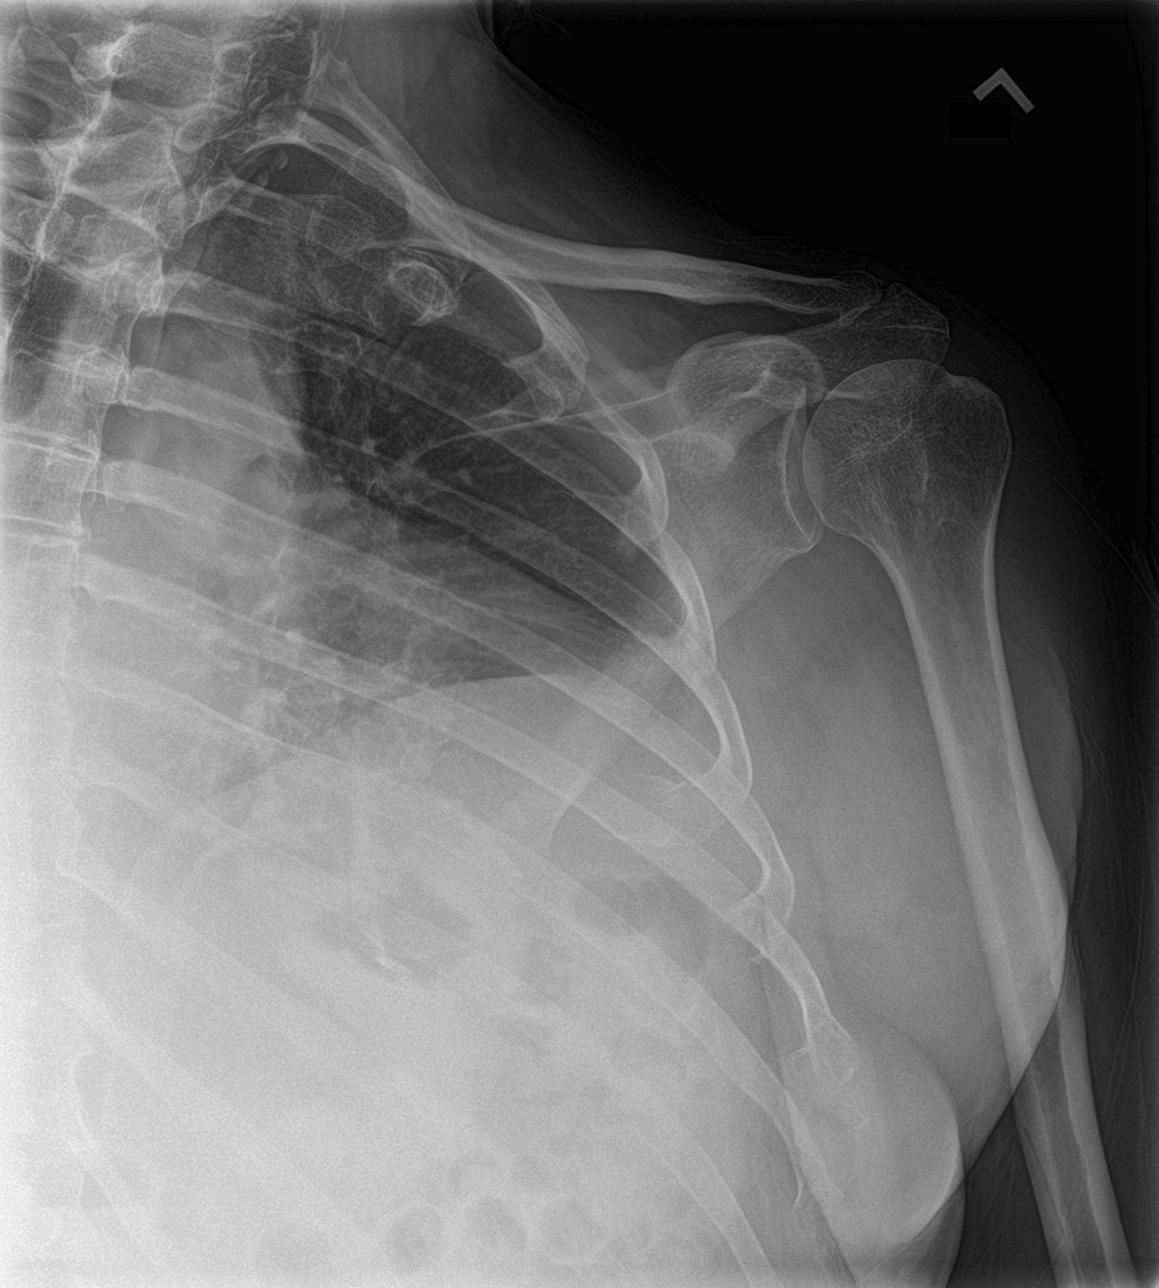
[im 2/3]
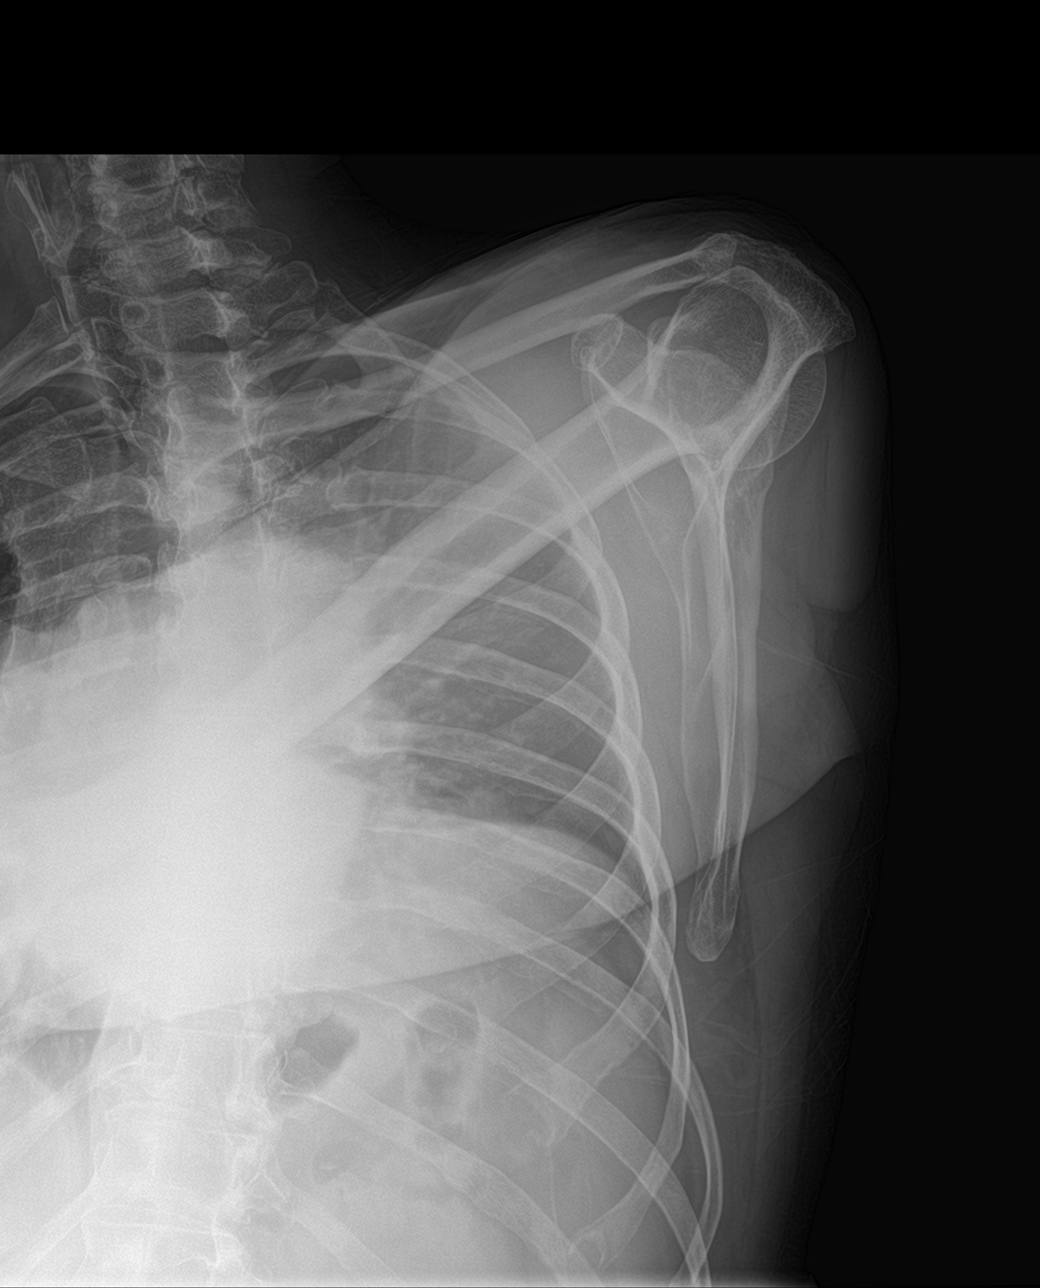
[im 3/3]
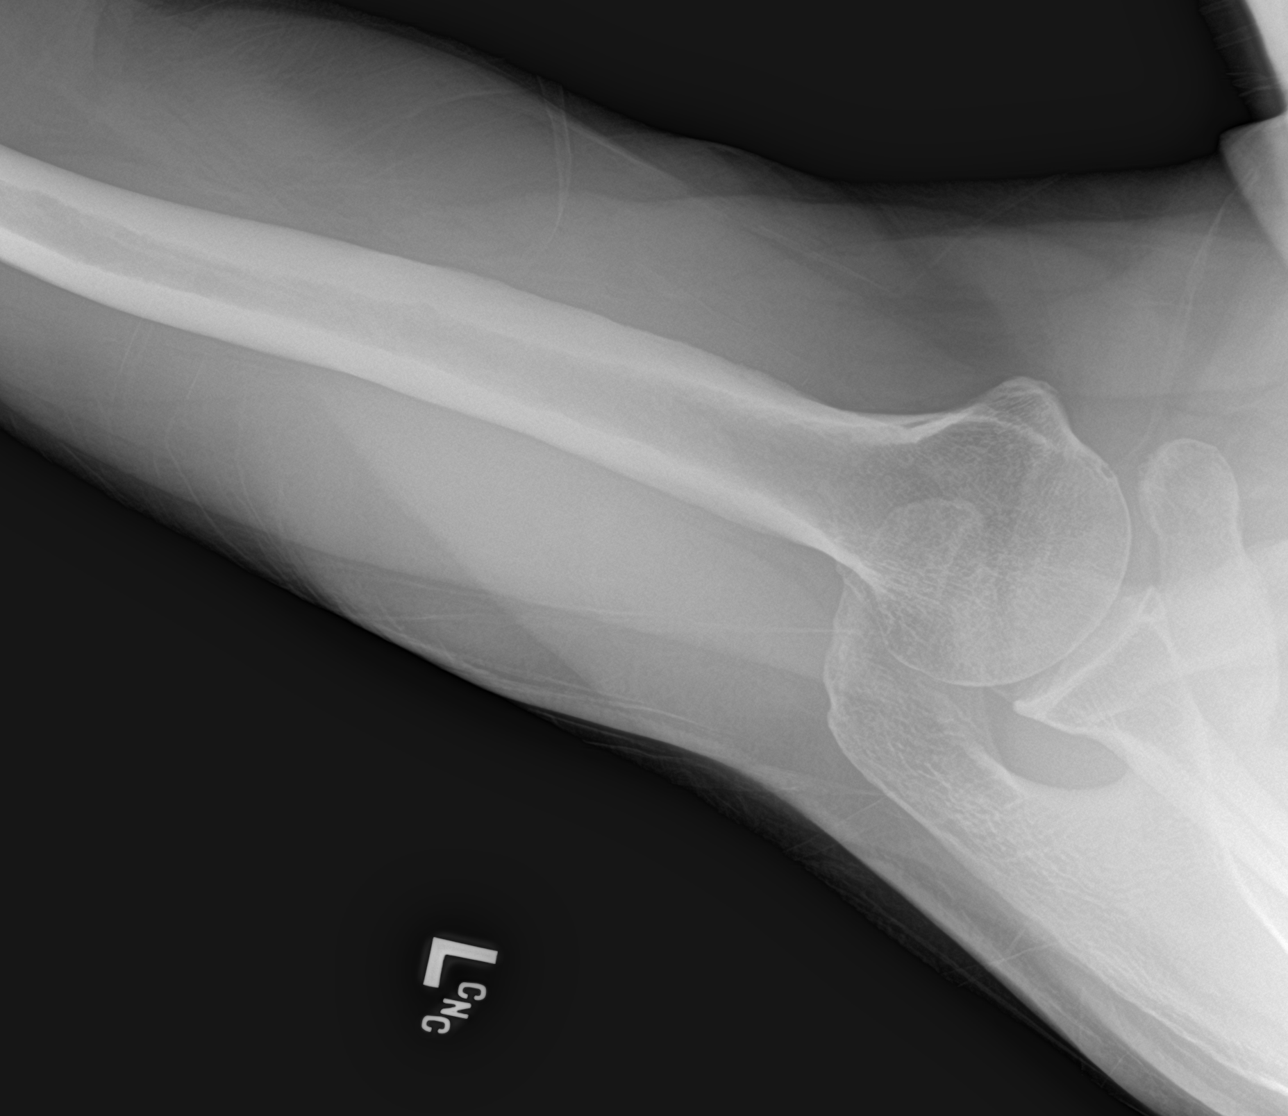

[3 of 3 positions shown; findings below may reference images not displayed]

FINDINGS: No evidence for an acute fracture. No subluxation or dislocation.
Mild degenerative changes noted AC joint. No suspicious lytic or
sclerotic osseous abnormality.
IMPRESSION: AC joint degeneration.  No acute bony abnormality.

## 2021-11-18 MED ORDER — GADOBUTROL 1 MMOL/ML IV SOLN
8.0000 mL | Freq: Once | INTRAVENOUS | Status: AC | PRN
  Administered 2021-11-18: 8 mL via INTRAVENOUS

## 2021-11-18 MED ORDER — ONDANSETRON HCL 4 MG/2ML IJ SOLN
4.0000 mg | INTRAMUSCULAR | Status: AC
  Administered 2021-11-18: 4 mg via INTRAVENOUS
  Filled 2021-11-18: qty 2

## 2021-11-18 MED ORDER — KETOROLAC TROMETHAMINE 30 MG/ML IJ SOLN
15.0000 mg | Freq: Once | INTRAMUSCULAR | Status: AC
  Administered 2021-11-18: 15 mg via INTRAVENOUS
  Filled 2021-11-18: qty 1

## 2021-11-18 MED ORDER — DEXAMETHASONE SODIUM PHOSPHATE 10 MG/ML IJ SOLN
10.0000 mg | Freq: Once | INTRAMUSCULAR | Status: AC
  Administered 2021-11-18: 10 mg via INTRAVENOUS
  Filled 2021-11-18: qty 1

## 2021-11-18 MED ORDER — PREDNISONE 10 MG (21) PO TBPK: ORAL_TABLET | ORAL | 0 refills | Status: AC

## 2021-11-18 MED ORDER — CYCLOBENZAPRINE HCL 5 MG PO TABS: 5.0000 mg | ORAL_TABLET | Freq: Three times a day (TID) | ORAL | 0 refills | Status: AC | PRN

## 2021-11-18 MED ORDER — MORPHINE SULFATE (PF) 4 MG/ML IV SOLN
4.0000 mg | Freq: Once | INTRAVENOUS | Status: AC
  Administered 2021-11-18: 4 mg via INTRAVENOUS
  Filled 2021-11-18: qty 1

## 2021-11-18 NOTE — ED Triage Notes (Addendum)
Pt presents to ER c/o neck pain that radiates down left arm into hand that started on tuesday.  Pt denies injuries to neck.  Pt is also a cancer pt who is on chemo currently.  Pt A&Ox4 at this time and in NAD.

## 2021-11-18 NOTE — ED Notes (Signed)
Pt back from MRI 

## 2021-11-18 NOTE — Discharge Instructions (Addendum)
Take the steroids starting tomorrow morning.  Use the Flexeril but it can make you kind of sleepy so start off at nighttime.  Do not drive on it.  Call the neurosurgeon to get follow-up and return to the ER if you develop any worsening symptoms or any other concern   1. No multiple myeloma lesion or marrow edema identified in the cervical spine. But there is abnormal dural thickening and/or abnormal epidural space at the C4 and C5 levels. But this is nonspecific. Trace epidural hematoma is possible. Malignant dural thickening is difficult to exclude. Degenerative/inflammatory dural thickening is felt less likely, and there is no strong evidence of acute spinal infection. Inflammation appears to involve the exiting left C4 and C5 nerve roots.   2. Underlying cervical spine degeneration, including chronic moderate to severe facet arthropathy at C3-C4 and C4-C5. Degenerative appearing facet joint effusion on the left at the latter. Multilevel mild degenerative spinal stenosis and spinal cord mass effect C3-C4 through C5-C6. No spinal cord signal abnormality identified. Moderate and occasionally severe severe degenerative neural foraminal stenosis at the right C4, left C5, bilateral C6 and C7 nerve levels.

## 2021-11-18 NOTE — ED Provider Notes (Signed)
[facility] Provider Note    Event Date/Time   First MD Initiated Contact with Patient 11/18/21 [phone]     (approximate)   History   Neck Pain  HPI  Luis Burke is a 66 y.o. male with an extensive past medical history most notably includes multiple myeloma status post autologous bone marrow transplant and ongoing chemotherapy managed by Dr. Tasia Catchings and also followed by [facility] oncology.  He also has a history of degenerative disc disease that seems to have affected both the cervical spine and lumbar spine as well as canal stenosis and the C and L-spine's and a history of cervical radiculopathy.  He presents tonight by private vehicle for evaluation of gradually worsening and now severe pain in his neck and radiating down his left arm.  He says that it has been gradually worsening over the last 5 days, got briefly better when he took a course of pretreatment steroids 2 days ago, but has become so severe that he cannot sleep or move without worsening pain.  He has no weakness although it is difficult to appreciate for certain because of the amount of pain when he moves his left arm and shoulder.  He also feels like his left shoulder is "popping a lot" and he is worried he may have a fracture.  He recently dealt with a fracture in his right foot that occurred without a trauma (a pathological fracture) and he is afraid something similar may have happened in his shoulder.     Physical Exam   Triage Vital Signs: ED Triage Vitals [11/18/21 0524]  Enc Vitals Group     BP (!) 176/101     Pulse Rate 65     Resp 17     Temp (!) 97.5 F (36.4 C)     Temp Source Oral     SpO2 99 %     Weight 93 kg (205 lb)     Height 1.778 m (5' 10" )     Head Circumference      Peak Flow      Pain Score 10     Pain Loc      Pain Edu?      Excl. in [place]?     Most recent vital signs: Vitals:   11/18/21 0524  BP: (!) 176/101  Pulse: 65  Resp: 17  Temp: (!) 97.5 F (36.4 C)  SpO2:  99%     General: Awake, appears uncomfortable but not in severe distress and is overall well-appearing. CV:  Good peripheral perfusion.  Normal capillary refill. Resp:  Normal effort.  Abd:  No distention.  Other:  The patient is holding his neck slightly to the right.  He reports some mild tenderness to palpation of the muscles of the cervical spine but he has no specific tenderness to palpation of the cervical spine itself.  However he has pain with any amount of range of motion of his neck and his left shoulder and arm.  Symptoms are most consistent with cervical radiculopathy.  No subjective numbness or tingling and no loss of muscle strength although it is difficult to assess given the pain associated with movement of his left arm.  However, he has no limitation of range of motion when the left shoulder is passively ranged and his exam is not consistent with a shoulder dislocation.   ED Results / Procedures / Treatments   Labs (all labs ordered are listed, but only abnormal results are displayed) Labs  Reviewed  CBC WITH DIFFERENTIAL/PLATELET - Abnormal; Notable for the following components:      Result Value   WBC 3.5 (*)    RBC 3.53 (*)    Hemoglobin 11.8 (*)    HCT 35.5 (*)    MCV 100.6 (*)    All other components within normal limits  BASIC METABOLIC PANEL - Abnormal; Notable for the following components:   Glucose, Bld 105 (*)    BUN 24 (*)    Creatinine, Ser 1.31 (*)    All other components within normal limits    RADIOLOGY  MRI of the cervical spine with and without contrast is pending at the time of transfer of care, as are the left shoulder x-rays.   PROCEDURES:  Critical Care performed: No  Procedures   MEDICATIONS ORDERED IN ED: Medications  morphine (PF) 4 MG/ML injection 4 mg (4 mg Intravenous Given 11/18/21 0616)  ondansetron (ZOFRAN) injection 4 mg (4 mg Intravenous Given 11/18/21 0616)  ketorolac (TORADOL) 30 MG/ML injection 15 mg (15 mg Intravenous  Given 11/18/21 0616)  gadobutrol (GADAVIST) 1 MMOL/ML injection 8 mL (8 mLs Intravenous Contrast Given 11/18/21 0640)     IMPRESSION / MDM / ASSESSMENT AND PLAN / ED COURSE  I reviewed the triage vital signs and the nursing notes.                              Differential diagnosis includes, but is not limited to, acute on chronic cervical radiculopathy with known canal stenosis, spinal neoplasm, pathologic fracture, shoulder dislocation, osteomyelitis/discitis, syrinx.  The patient is generally well-appearing and uncomfortable but nontoxic and not in severe distress.  No respiratory, cardiac, nor GI symptoms.  No focal neurological deficits.  I reviewed the medical records including oncology notes from Dr. Tasia Catchings and most specifically I reviewed an MRI cervical spine with and without contrast report from almost exactly a year ago which was generally reassuring except for some foraminal stenosis.  I also reviewed the results of a PET scan which he had as part of his oncological treatment and evaluation from Dr. Tasia Catchings; the PET scan results from about 2 months ago did not show any new areas of concern.  However, he is presenting with acute worsening of his neck and left shoulder pain, in the setting of ongoing chemotherapy and multiple myeloma and a recent pathologic fracture of his right foot.  Given the morbidity and mortality risk associated with a missed cervical spine lesion, either fracture or neoplasm, I feel it is incumbent upon me to rule out an emergent/urgent condition with an MRI cervical spine with and without contrast.  I will check basic labs as well.  I am treating his current pain with morphine 4 mg IV and Toradol 15 mg IV, as well as a dose of Zofran 4 mg IV to try and prevent narcotics-induced nausea.  I talked with the patient about this plan and he agrees.  If his MRI is negative for any emergent neurosurgical finding, I think he can be treated symptomatically for the cervical radiculopathy  and possibly referred to one of the [facility] neurosurgeons who also works at [facility] (Dr. Izora Ribas or Dr. Lacinda Axon); he would be interested in this outpatient follow-up.  He may also follow-up with Dr. Tasia Catchings.  He seemed to have some improvement of his symptoms several days ago with some pretreatment steroids, so it may be worth a discussion with Dr. Tasia Catchings to see if  a steroid taper would be harmful or contraindicated given his ongoing chemotherapy treatments.     Clinical Course as of 11/18/21 1610  Nancy Fetter Nov 18, 2021  0640 I reviewed the patient's lab results.  His basic metabolic panel is stable with a creatinine of 1.3 which seems to be right about his baseline and a GFR greater than 60.  His CBC with differential is notable for mild leukopenia and mild anemia, but this is not unexpected given his history of multiple myeloma undergoing regular chemotherapy.  No indication of an emergent medical issue based on lab work. [CF]  [id number] Transferring ED care at 7 AM to Dr. Jari Pigg to follow-up on MRI and radiograph results and to reassess the patient to determine the appropriate treatment and disposition. [CF]    Clinical Course User Index [CF] Hinda Kehr, MD     FINAL CLINICAL IMPRESSION(S) / ED DIAGNOSES   Final diagnoses:  Pain  Neck pain  Cervical radiculopathy  Acute pain of left shoulder  Multiple myeloma, remission status unspecified ([place])  Immunocompromised ([place])     Rx / DC Orders   ED Discharge Orders     None        Note:  This document was prepared using Dragon voice recognition software and may include unintentional dictation errors.   Hinda Kehr, MD 11/18/21 [phone]

## 2021-11-18 NOTE — ED Provider Notes (Signed)
7:25 AM Assumed care for off going team.   Blood pressure (!) 176/101, pulse 65, temperature (!) 97.5 F (36.4 C), temperature source Oral, resp. rate 17, height 5\' 10"  (1.778 m), weight 93 kg, SpO2 99 %.  See their HPI for full report but in brief MRI/xray shoulder--- MM on chemo h/o chronic neck pain.   X-ray was personally reviewed by myself without any evidence of fracture but there is some AC joint degeneration's.  MRI of the spine comments on some dural thickening C4-C5 that could be a trace epidural hematoma versus considering a spinal infection.  Patient is afebrile.  White count is at baseline.  Discussed the case with Dr. Cari Caraway who reviewed the images he felt like this is more likely degenerative disc disease.  He recommended trialing some IV Decadron and started him on some taper for at home and following up in his clinic to discuss possible epidural injections versus physical therapy etc.  I did consult Dr. Tasia Catchings as well and discussed with him to make sure they were okay with starting on steroids and he was okay with.  Patient reports feeling much more comfortable after the IV Decadron.  He feels comfortable with discharge home.  Will start on a Sterapred taper.  Patient reports having success with Flexeril in the past but ran out recently.  We will give him a short course of this and discussed the risk and not driving on it.  He expressed understanding.  I discussed the provisional nature of ED diagnosis, the treatment so far, the ongoing plan of care, follow up appointments and return precautions with the patient and any family or support people present. They expressed understanding and agreed with the plan, discharged home.      Vanessa Bremen, MD 11/18/21 [phone]

## 2021-11-18 NOTE — ED Notes (Signed)
Patient returned from MRI.

## 2021-11-18 NOTE — ED Notes (Signed)
Provider at bedside to evaluate patient

## 2021-11-18 NOTE — ED Notes (Signed)
Patient to MRI via wheelchair.

## 2021-11-19 ENCOUNTER — Other Ambulatory Visit: Payer: Self-pay | Admitting: Physician Assistant

## 2021-11-19 NOTE — H&P (Signed)
Chief Complaint: Patient was seen in consultation today for bone marrow biopsy with aspiration   Referring Physician(s): Yu,Zhou  Supervising Physician: Daryll Brod  Patient Status: [facility] - Out-pt  History of Present Illness: Luis Burke is a 66 y.o. male with a medical history significant for multiple myeloma s/p bone marrow transplant 04/23/2019 and relapse. He is currently receiving treatment and his oncology team has requested a restaging bone marrow biopsy with aspiration. This patient is familiar to IR from several bone marrow biopsies in the past. 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   Fatty liver    on u/s 07/2018   Hyperlipidemia 03/2004   Hypertension 1994   Multiple myeloma ([place])    Snores     Past Surgical History:  Procedure Laterality Date   CATARACT EXTRACTION Left 10/10/2021   CATARACT EXTRACTION W/PHACO Right 03/04/2018   Procedure: CATARACT EXTRACTION PHACO AND INTRAOCULAR LENS PLACEMENT ([place])  RIGHT TORIC;  Surgeon: Leandrew Koyanagi, MD;  Location: [facility];  Service: Ophthalmology;  Laterality: Right;  [place] no Toric Lens 1:45 5.3   CATARACT EXTRACTION W/PHACO Left 10/10/2021   Procedure: CATARACT EXTRACTION PHACO AND INTRAOCULAR LENS PLACEMENT ([place]) LEFT;  Surgeon: Leandrew Koyanagi, MD;  Location: [facility];  Service: Ophthalmology;  Laterality: Left;  5.16 1:03.8   COLONOSCOPY WITH PROPOFOL N/A 07/20/2018   Procedure: COLONOSCOPY WITH PROPOFOL;  Surgeon: Jonathon Bellows, MD;  Location: [facility] ENDOSCOPY;  Service: Gastroenterology;  Laterality: N/A;   ESOPHAGOGASTRODUODENOSCOPY (EGD) WITH PROPOFOL N/A 07/20/2018   Procedure: ESOPHAGOGASTRODUODENOSCOPY (EGD) WITH PROPOFOL;  Surgeon: Jonathon Bellows, MD;  Location: [facility] ENDOSCOPY;   Service: Gastroenterology;  Laterality: N/A;   EYE SURGERY Right    HERNIA REPAIR     L Lap herniorraphy  04/2000   Left wrist ganglionectomy      Allergies: Bactrim [sulfamethoxazole-trimethoprim], Clonidine derivatives, and Tadalafil  Medications: Prior to Admission medications   Medication Sig Start Date End Date Taking? Authorizing Provider  acetaminophen (TYLENOL) 325 MG tablet Take 2 tablets (650 mg total) by mouth See admin instructions. 1 hour prior to chemotherapy treatments 06/07/21   Earlie Server, MD  amLODipine (NORVASC) 5 MG tablet TAKE 2 TABLETS BY MOUTH EVERY DAY 11/13/21   Tonia Ghent, MD  ASPIRIN 81 PO Take 81 mg by mouth daily.    [provider]  B Complex Vitamins (VITAMIN B COMPLEX PO) Take 1 Dose by mouth daily. 11/17/19   [provider]  chlorhexidine (PERIDEX) 0.12 % solution Use as directed 15 mLs in the mouth or throat 2 (two) times daily. 06/19/21   Earlie Server, MD  co-enzyme Q-10 50 MG capsule Take 50 mg by mouth daily.    [provider]  [employer] VITAMIN B12 1000 MCG tablet TAKE 1 TABLET BY MOUTH EVERY DAY 01/09/21   Tonia Ghent, MD  cyclobenzaprine (FLEXERIL) 5 MG tablet Take 1 tablet (5 mg total) by mouth 3 (three) times daily as needed for up to 7 days for muscle spasms. 11/18/21 11/25/21  Vanessa Superior, MD  Denosumab (XGEVA [place]) Inject into the skin every 30 (thirty) days. Last rcvd 09/27/21    [provider]  dexamethasone (DECADRON) 4 MG tablet Take 5 tablets (20 mg total) by mouth once a  week. Take at least 1 hour prior to infusion appt. Take with food. To start on 06/07/21. 08/02/21   Earlie Server, MD  diphenhydrAMINE (BENADRYL) 50 MG tablet Take 1 tablet (50 mg total) by mouth See admin instructions. Take 1 tablet 1 hour prior to chemotherapy treatments. 06/07/21   Earlie Server, MD  hydrALAZINE (APRESOLINE) 10 MG tablet TAKE 1 TABLET(10 MG) BY MOUTH THREE TIMES DAILY 11/15/21   Tonia Ghent, MD  lisinopril (ZESTRIL) 20 MG tablet TAKE 1  TABLET BY MOUTH EVERY DAY 10/05/21   Tonia Ghent, MD  loperamide (IMODIUM) 2 MG capsule Take 1 capsule (2 mg total) by mouth See admin instructions. Take 2 capsules at the onset of diarrhea, then 1 capsule every 2 hours or after every loose bowel movement. Maximum 8 capsules per 24 hours. 06/12/21   Earlie Server, MD  LORazepam (ATIVAN) 1 MG tablet TAKE 1 TABLET (1 MG TOTAL) BY MOUTH EVERY 6 (SIX) HOURS AS NEEDED FOR ANXIETY (FOR NAUSEA) 04/14/19   [provider]  magnesium chloride (SLOW-MAG) 64 MG TBEC SR tablet Take 1 tablet (64 mg total) by mouth daily. 06/22/19   Earlie Server, MD  metoprolol succinate (TOPROL-XL) 50 MG 24 hr tablet TAKE 4 TABLETS BY MOUTH EVERY DAY, TAKE WITH OR IMMEDIATELY FOLLOWING A MEAL 11/05/21   Tonia Ghent, MD  montelukast (SINGULAIR) 10 MG tablet Take 1 tablet (10 mg total) by mouth See admin instructions. Take 1day prior to chemotherapy 07/08/21   Earlie Server, MD  ondansetron (ZOFRAN) 8 MG tablet Take 1 tablet (8 mg total) by mouth every 8 (eight) hours as needed. for nausea 02/23/21   Borders, Kirt Boys, NP  pomalidomide (POMALYST) 2 MG capsule Take 1 capsule (2 mg total) by mouth daily. Take for 21 days on, then hold for 7 days. Repeat every 28 days. 11/15/21   Earlie Server, MD  pravastatin (PRAVACHOL) 10 MG tablet TAKE 1 TABLET BY MOUTH EVERY DAY 09/04/21   Tonia Ghent, MD  predniSONE (STERAPRED UNI-PAK 21 TAB) 10 MG (21) TBPK tablet Take as directed per day 11/18/21 11/24/21  Vanessa Newdale, MD  prochlorperazine (COMPAZINE) 10 MG tablet  04/14/19   [provider]  sildenafil (REVATIO) 20 MG tablet Take 1-5 tablets (20-100 mg total) by mouth daily as needed. 07/23/21   Tonia Ghent, MD  zinc gluconate 50 MG tablet Take 50 mg by mouth daily.    [provider]     Family History  Problem Relation Age of Onset   Hypertension Mother    Diabetes Mother    Heart disease Mother        CAD   Dementia Mother    Prostate cancer Father    Hypertension  Father    Heart disease Father        MI 02/03   Colon cancer Father    Hypertension Sister    Hypertension Sister    Hypertension Sister     Social History   Socioeconomic History   Marital status: Married    Spouse name: Not on file   Number of children: 1   Years of education: Not on file   Highest education level: Not on file  Occupational History   Occupation: [employer]  Tobacco Use   Smoking status: Never   Smokeless tobacco: Never   Tobacco comments:    Occassionally  Vaping Use   Vaping Use: Never used  Substance and Sexual Activity   Alcohol use: Not  Currently    Alcohol/week: 3.0 standard drinks    Types: 3 Cans of beer per week   Drug use: No   Sexual activity: Not on file  Other Topics Concern   Not on file  Social History Narrative   Divorced 2009, married 2021-06-24.     His daughter died 4 days after giving birth to the patient's granddaughter   Has joint custody of his deceased daughter's child   Worked at [employer] is [place], retired 2020.     Social Determinants of Health   Financial Resource Strain: Not on file  Food Insecurity: Not on file  Transportation Needs: Not on file  Physical Activity: Not on file  Stress: Not on file  Social Connections: Not on file    Review of Systems: A 12 point ROS discussed and pertinent positives are indicated in the HPI above.  All other systems are negative.  Review of Systems  Constitutional:  Negative for activity change and fatigue.  Respiratory:  Negative for choking and chest tightness.   Cardiovascular:  Negative for chest pain and leg swelling.  Gastrointestinal:  Negative for abdominal pain, diarrhea, nausea and vomiting.  Musculoskeletal:  Positive for neck pain.  Neurological:  Negative for headaches.   Vital Signs: There were no vitals taken for this visit.  Physical Exam Constitutional:      General: He is not in acute distress.    Appearance: He is not ill-appearing.  HENT:      Mouth/Throat:     Mouth: Mucous membranes are moist.     Pharynx: Oropharynx is clear.  Cardiovascular:     Rate and Rhythm: Normal rate and regular rhythm.     Pulses: Normal pulses.     Heart sounds: Normal heart sounds.  Pulmonary:     Effort: Pulmonary effort is normal.     Breath sounds: Normal breath sounds.  Abdominal:     General: Bowel sounds are normal.     Palpations: Abdomen is soft.  Skin:    General: Skin is warm and dry.  Neurological:     Mental Status: He is alert and oriented to person, place, and time.    Imaging: MR Cervical Spine W or Wo Contrast  Result Date: 11/18/2021 CLINICAL DATA:  66 year old male with multiple myeloma. Neck pain. EXAM: MRI CERVICAL SPINE WITHOUT AND WITH CONTRAST TECHNIQUE: Multiplanar and multiecho pulse sequences of the cervical spine, to include the craniocervical junction and cervicothoracic junction, were obtained without and with intravenous contrast. CONTRAST:  80m GADAVIST GADOBUTROL 1 MMOL/ML IV SOLN COMPARISON:  PET-CT 09/20/2021. Cervical spine CT 09/27/2009. FINDINGS: Alignment: Chronic straightening of cervical lordosis has not significantly changed since 2010, including trace retrolisthesis of C5 on C6. Vertebrae: Visible bone marrow signal is within normal limits. No acute or suspicious osseous lesion identified. Pronounced degenerative osseous changes at the left C4-C5 facet (see below), C5-C6 endplates. Cord: No spinal cord signal abnormality despite some degenerative cord mass effect. No abnormal intradural enhancement. However, there is circumferential abnormal dural thickening and enhancement at the C4 and C5 levels (series 12, image 14), and eccentric to the left and posteriorly at C4 (series 12, image 10). On noncontrast images the dura and epidural space here is T2 hyperintense and T1 hypointense. And there appears to be asymmetric epidural enhancement also at the left C4 and C5 neural foramina. See additional details  below. Posterior Fossa, vertebral arteries, paraspinal tissues: Cervicomedullary junction is within normal limits. Negative visible posterior fossa. Preserved major vascular  flow voids in the neck. Patchy STIR hyperintensity in the interspinous ligament at C3-C4, but no associated enhancement and other visible neck soft tissues appear negative. Negative visible left lung apex. Disc levels: C2-C3: Foraminal disc bulging and endplate spurring greater on the left. No significant stenosis. C3-C4: Moderate to severe right side facet hypertrophy. Mild ligament flavum hypertrophy. Rightward disc bulging and endplate spurring. Mild spinal stenosis. Up to mild cord mass effect. Moderate to severe right C4 foraminal stenosis. C4-C5: Mild-to-moderate right but moderate to severe left facet hypertrophy. Degenerative appearing left facet joint fluid. Mild disc bulging and endplate spurring. Mild spinal stenosis when combined with the abnormal dural thickening and/or epidural space here. Mild spinal cord mass effect. Moderate left and mild right C5 foraminal stenosis. C5-C6: Abnormal dural/epidural space continues to this level. Mild retrolisthesis with disc space loss and circumferential disc osteophyte complex. Negative facets. Mild spinal stenosis and spinal cord mass effect. Moderate to severe bilateral C6 foraminal stenosis. C6-C7: Disc space loss with circumferential disc osteophyte complex. No spinal stenosis. Moderate bilateral C7 foraminal stenosis. C7-T1: Mild anterolisthesis. Moderate facet hypertrophy greater on the right. Mild endplate spurring. No spinal or significant foraminal stenosis. Negative visible upper thoracic levels. IMPRESSION: 1. No multiple myeloma lesion or marrow edema identified in the cervical spine. But there is abnormal dural thickening and/or abnormal epidural space at the C4 and C5 levels. But this is nonspecific. Trace epidural hematoma is possible. Malignant dural thickening is difficult to  exclude. Degenerative/inflammatory dural thickening is felt Luis likely, and there is no strong evidence of acute spinal infection. Inflammation appears to involve the exiting left C4 and C5 nerve roots. 2. Underlying cervical spine degeneration, including chronic moderate to severe facet arthropathy at C3-C4 and C4-C5. Degenerative appearing facet joint effusion on the left at the latter. Multilevel mild degenerative spinal stenosis and spinal cord mass effect C3-C4 through C5-C6. No spinal cord signal abnormality identified. Moderate and occasionally severe severe degenerative neural foraminal stenosis at the right C4, left C5, bilateral C6 and C7 nerve levels. Electronically Signed   By: Genevie Ann M.D.   On: 11/18/2021 06:59   DG Shoulder Left  Result Date: 11/18/2021 CLINICAL DATA:  Acute on chronic left shoulder pain. EXAM: LEFT SHOULDER - 2+ VIEW COMPARISON:  None. FINDINGS: No evidence for an acute fracture. No subluxation or dislocation. Mild degenerative changes noted AC joint. No suspicious lytic or sclerotic osseous abnormality. IMPRESSION: AC joint degeneration.  No acute bony abnormality. Electronically Signed   By: Misty Stanley M.D.   On: 11/18/2021 07:12   DG Foot Complete Right  Result Date: 10/30/2021 Please see detailed radiograph report in office note.   Labs:  CBC: Recent Labs    10/11/21 0818 10/25/21 0758 11/08/21 0758 11/18/21 0606  WBC 4.4 3.9* 4.4 3.5*  HGB 12.9* 12.7* 13.4 11.8*  HCT 37.9* 37.1* 39.4 35.5*  PLT 100* 115* 89* 105*    COAGS: No results for input(s): INR, APTT in the last 8760 hours.  BMP: Recent Labs    10/11/21 0818 10/25/21 0758 11/08/21 0758 11/18/21 0606  NA 135 135 136 137  K 4.0 4.3 3.9 4.0  CL 99 104 103 108  CO2 _0 GLUCOSE 107* 102* 117* 105*  BUN _1 24*  CALCIUM 9.4 9.0 9.4 9.0  CREATININE 1.23 1.46* 1.32* 1.31*  GFRNONAA >60 53* 60* >60    LIVER FUNCTION TESTS: Recent Labs    09/27/21 0855 10/11/21 0818  10/25/21 4765  11/08/21 0758  BILITOT 0.7 0.8 0.7 1.2  AST _0 ALT _1 ALKPHOS 63 74 60 58  PROT 6.7 6.9 6.8 7.2  ALBUMIN 4.3 4.2 4.3 4.5    TUMOR MARKERS: No results for input(s): AFPTM, CEA, CA199, CHROMGRNA in the last 8760 hours.  Assessment and Plan:  Multiple myeloma: Belenda Cruise L. Yoshino, 66 year old male, presents today to the [facility] Interventional Radiology department for an image-guided bone marrow biopsy with aspiration.  Risks and benefits of this procedure were discussed with the patient and/or patient's family including, but not limited to bleeding, infection, damage to adjacent structures or low yield requiring additional tests.  All of the questions were answered and there is agreement to proceed.  Consent signed and in chart.  Thank you for this interesting consult.  I greatly enjoyed meeting LYDIA MENG and look forward to participating in their care.  A copy of this report was sent to the requesting provider on this date.  Electronically Signed: Soyla Dryer, AGACNP-BC [phone] 11/20/2021, 8:20 AM   I spent a total of  30 Minutes   in face to face in clinical consultation, greater than 50% of which was counseling/coordinating care for bone marrow biopsy with aspiration.

## 2021-11-20 ENCOUNTER — Ambulatory Visit
Admission: RE | Admit: 2021-11-20 | Discharge: 2021-11-20 | Disposition: A | Discharge status: Home / Self Care | Payer: Medicare Other | Source: Ambulatory Visit | Attending: Oncology | Admitting: Oncology

## 2021-11-20 ENCOUNTER — Other Ambulatory Visit: Payer: Self-pay

## 2021-11-20 DIAGNOSIS — D7589 Other specified diseases of blood and blood-forming organs: Secondary | ICD-10-CM | POA: Insufficient documentation

## 2021-11-20 DIAGNOSIS — Z9481 Bone marrow transplant status: Secondary | ICD-10-CM | POA: Insufficient documentation

## 2021-11-20 DIAGNOSIS — C9002 Multiple myeloma in relapse: Secondary | ICD-10-CM | POA: Diagnosis present

## 2021-11-20 DIAGNOSIS — M542 Cervicalgia: Secondary | ICD-10-CM | POA: Diagnosis not present

## 2021-11-20 LAB — CBC WITH DIFFERENTIAL/PLATELET
Abs Immature Granulocytes: 0.04 10*3/uL (ref 0.00–0.07)
Basophils Absolute: 0 10*3/uL (ref 0.0–0.1)
Basophils Relative: 0 %
Eosinophils Absolute: 0 10*3/uL (ref 0.0–0.5)
Eosinophils Relative: 0 %
HCT: 39.6 % (ref 39.0–52.0)
Hemoglobin: 13.2 g/dL (ref 13.0–17.0)
Immature Granulocytes: 1 %
Lymphocytes Relative: 17 %
Lymphs Abs: 1.4 10*3/uL (ref 0.7–4.0)
MCH: 34.2 pg — ABNORMAL HIGH (ref 26.0–34.0)
MCHC: 33.3 g/dL (ref 30.0–36.0)
MCV: 102.6 fL — ABNORMAL HIGH (ref 80.0–100.0)
Monocytes Absolute: 0.9 10*3/uL (ref 0.1–1.0)
Monocytes Relative: 11 %
Neutro Abs: 6 10*3/uL (ref 1.7–7.7)
Neutrophils Relative %: 71 %
Platelets: 151 10*3/uL (ref 150–400)
RBC: 3.86 MIL/uL — ABNORMAL LOW (ref 4.22–5.81)
RDW: 14.5 % (ref 11.5–15.5)
WBC: 8.4 10*3/uL (ref 4.0–10.5)
nRBC: 0 % (ref 0.0–0.2)

## 2021-11-20 IMAGING — CT CT BIOPSY AND ASPIRATION BONE MARROW
1 of 3 series · 10 of 14 positions shown, 13 images · non-contrast
Comparison: none

INDICATION: Myeloma workup

[Series 2: i-spiral 5.0 br38 · axial · 0.98mm/px · z∈[+935,+998]mm · 10 of 24 slices shown, 13 images]
[im 3/24  soft-tissue]
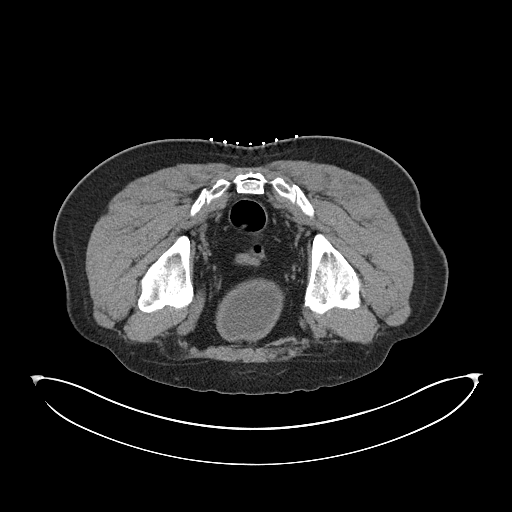
[im 3/24  bone]
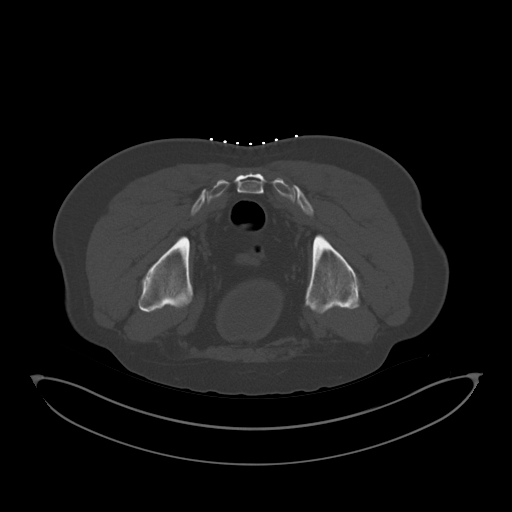
[im 5/24  bone]
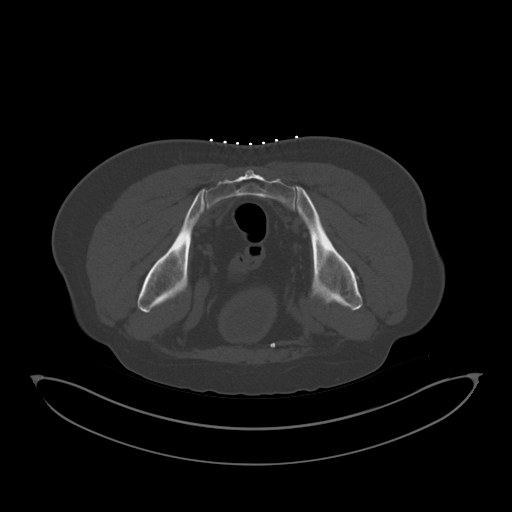
[im 7/24  bone]
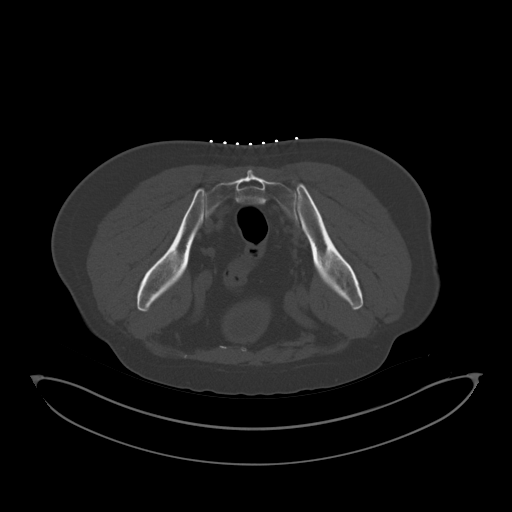
[im 9/24  bone]
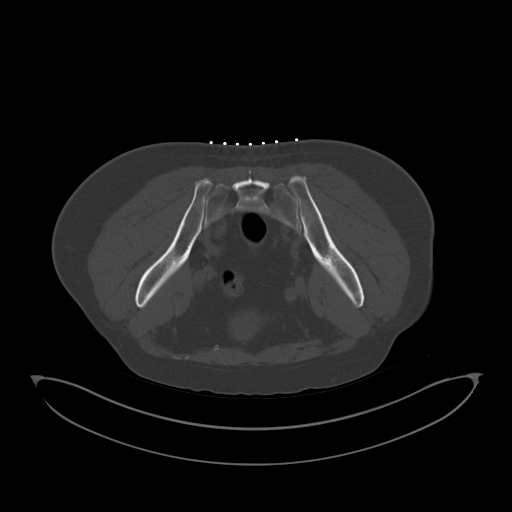
[im 11/24  soft-tissue]
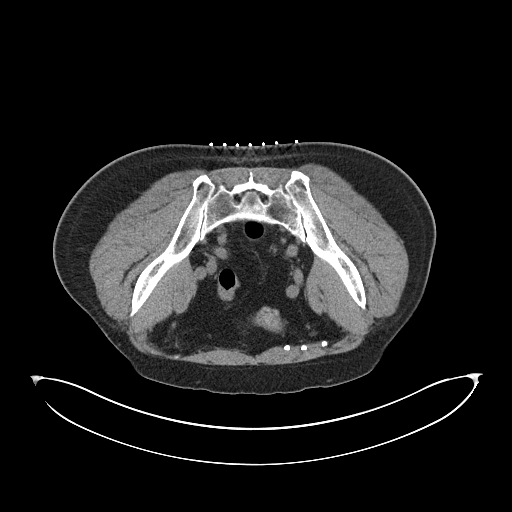
[im 11/24  bone]
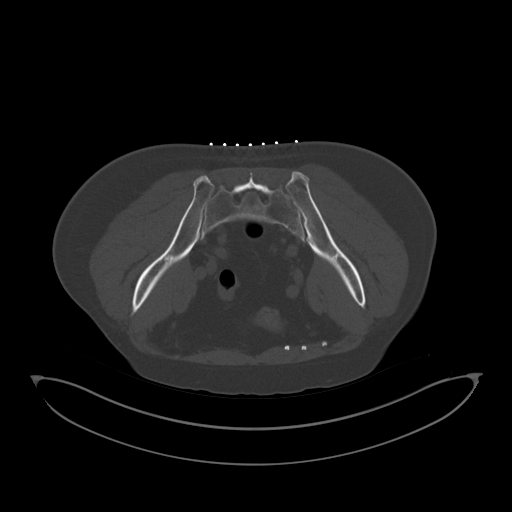
[im 13/24  bone]
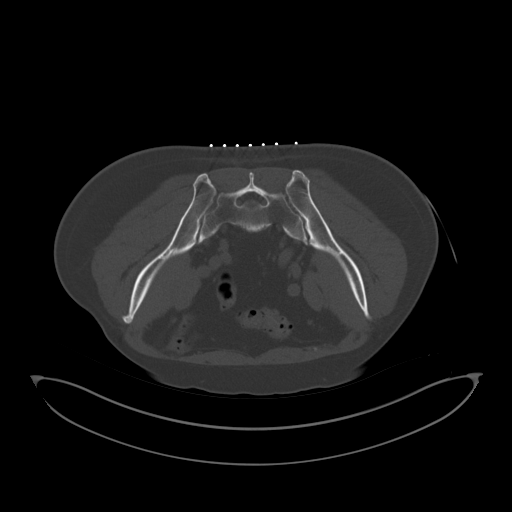
[im 15/24  bone]
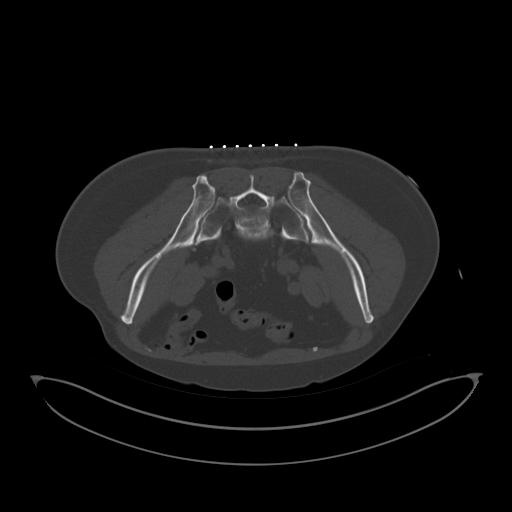
[im 17/24  bone]
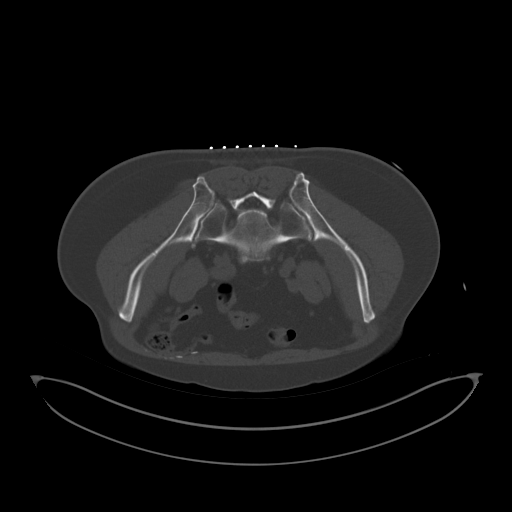
[im 19/24  soft-tissue]
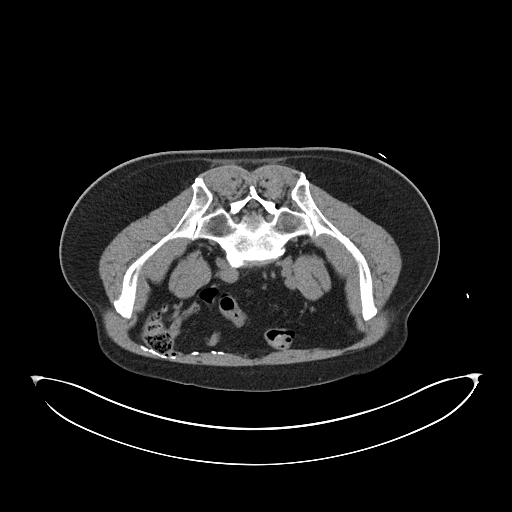
[im 19/24  bone]
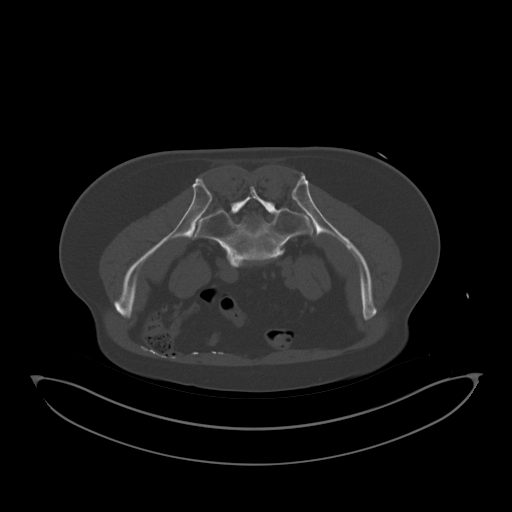
[im 21/24  bone]
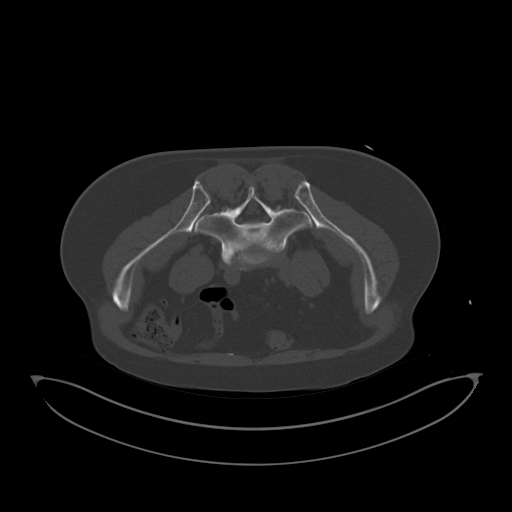

[10 of 14 positions shown; findings below may reference images not displayed]

EXAM:
CT GUIDED RIGHT ILIAC BONE MARROW ASPIRATION AND CORE BIOPSY

Radiologist:  BUI

Guidance:  CT

FLUOROSCOPY:
Fluoroscopy Time: None.

MEDICATIONS:
1% lidocaine local

ANESTHESIA/SEDATION:
2.0 mg IV Versed; 100 mcg IV Fentanyl

Moderate Sedation Time:  10 minute

The patient was continuously monitored during the procedure by the
interventional radiology nurse under my direct supervision.

CONTRAST:  None.

COMPLICATIONS:
None

PROCEDURE:
Informed consent was obtained from the patient following explanation
of the procedure, risks, benefits and alternatives. The patient
understands, agrees and consents for the procedure. All questions
were addressed. A time out was performed.

The patient was positioned prone and non-contrast localization CT
was performed of the pelvis to demonstrate the iliac marrow spaces.

Maximal barrier sterile technique utilized including caps, mask,
sterile gowns, sterile gloves, large sterile drape, hand hygiene,
and Betadine prep.

Under sterile conditions and local anesthesia, an 11 gauge coaxial
bone biopsy needle was advanced into the right iliac marrow space.
Needle position was confirmed with CT imaging. Initially, bone
marrow aspiration was performed. Next, the 11 gauge outer cannula
was utilized to obtain a right iliac bone marrow core biopsy. Needle
was removed. Hemostasis was obtained with compression. The patient
tolerated the procedure well. Samples were prepared with the
cytotechnologist. No immediate complications.
IMPRESSION: CT guided right iliac bone marrow aspiration and core biopsy.

## 2021-11-20 MED ORDER — MIDAZOLAM HCL 2 MG/2ML IJ SOLN
INTRAMUSCULAR | Status: AC | PRN
Start: 2021-11-20 — End: 2021-11-20
  Administered 2021-11-20 (×2): 1 mg via INTRAVENOUS

## 2021-11-20 MED ORDER — FENTANYL CITRATE (PF) 100 MCG/2ML IJ SOLN
INTRAMUSCULAR | Status: AC
  Filled 2021-11-20: qty 2

## 2021-11-20 MED ORDER — MIDAZOLAM HCL 2 MG/2ML IJ SOLN
INTRAMUSCULAR | Status: AC
  Filled 2021-11-20: qty 2

## 2021-11-20 MED ORDER — HEPARIN SOD (PORK) LOCK FLUSH 100 UNIT/ML IV SOLN
INTRAVENOUS | Status: AC
  Filled 2021-11-20: qty 5

## 2021-11-20 MED ORDER — SODIUM CHLORIDE 0.9 % IV SOLN: INTRAVENOUS | Status: DC

## 2021-11-20 MED ORDER — FENTANYL CITRATE (PF) 100 MCG/2ML IJ SOLN
INTRAMUSCULAR | Status: AC | PRN
  Administered 2021-11-20 (×2): 50 ug via INTRAVENOUS

## 2021-11-20 NOTE — Procedures (Signed)
Interventional Radiology Procedure Note  Procedure: CT BM ASP AND CORE    Complications: None  Estimated Blood Loss:  MIN  Findings: 11 G CORE AND BX    Tamera Punt, MD

## 2021-11-20 NOTE — Progress Notes (Signed)
Patient clinically stable post BMB per DR [facility], tolerated well. Vitals stable pre and post procedure. Received Versed 2 mg along with Fentanyl 100 mcg IV for procedure. Report given to [facility] Rn post procedure/specials.

## 2021-11-22 ENCOUNTER — Encounter: Payer: Self-pay | Admitting: Oncology

## 2021-11-22 ENCOUNTER — Inpatient Hospital Stay: Payer: Medicare Other | Attending: Oncology

## 2021-11-22 ENCOUNTER — Telehealth: Payer: Self-pay

## 2021-11-22 ENCOUNTER — Inpatient Hospital Stay (HOSPITAL_BASED_OUTPATIENT_CLINIC_OR_DEPARTMENT_OTHER): Payer: Medicare Other | Admitting: Oncology

## 2021-11-22 ENCOUNTER — Other Ambulatory Visit: Payer: Self-pay

## 2021-11-22 ENCOUNTER — Inpatient Hospital Stay: Payer: Medicare Other

## 2021-11-22 VITALS — BP 149/84 | HR 64 | Temp 98.7°F | Resp 20 | Wt 209.2 lb

## 2021-11-22 VITALS — BP 152/86 | HR 62 | Resp 18

## 2021-11-22 DIAGNOSIS — C9002 Multiple myeloma in relapse: Secondary | ICD-10-CM | POA: Diagnosis not present

## 2021-11-22 DIAGNOSIS — D6959 Other secondary thrombocytopenia: Secondary | ICD-10-CM | POA: Diagnosis not present

## 2021-11-22 DIAGNOSIS — Z5112 Encounter for antineoplastic immunotherapy: Secondary | ICD-10-CM | POA: Diagnosis not present

## 2021-11-22 DIAGNOSIS — D696 Thrombocytopenia, unspecified: Secondary | ICD-10-CM

## 2021-11-22 DIAGNOSIS — Z5111 Encounter for antineoplastic chemotherapy: Secondary | ICD-10-CM | POA: Diagnosis not present

## 2021-11-22 DIAGNOSIS — N1831 Chronic kidney disease, stage 3a: Secondary | ICD-10-CM | POA: Insufficient documentation

## 2021-11-22 DIAGNOSIS — C9000 Multiple myeloma not having achieved remission: Secondary | ICD-10-CM

## 2021-11-22 DIAGNOSIS — M542 Cervicalgia: Secondary | ICD-10-CM

## 2021-11-22 DIAGNOSIS — I129 Hypertensive chronic kidney disease with stage 1 through stage 4 chronic kidney disease, or unspecified chronic kidney disease: Secondary | ICD-10-CM | POA: Diagnosis not present

## 2021-11-22 DIAGNOSIS — M899 Disorder of bone, unspecified: Secondary | ICD-10-CM

## 2021-11-22 DIAGNOSIS — N179 Acute kidney failure, unspecified: Secondary | ICD-10-CM

## 2021-11-22 DIAGNOSIS — M8580 Other specified disorders of bone density and structure, unspecified site: Secondary | ICD-10-CM | POA: Insufficient documentation

## 2021-11-22 LAB — CBC WITH DIFFERENTIAL/PLATELET
Abs Immature Granulocytes: 0.11 10*3/uL — ABNORMAL HIGH (ref 0.00–0.07)
Basophils Absolute: 0 10*3/uL (ref 0.0–0.1)
Basophils Relative: 0 %
Eosinophils Absolute: 0 10*3/uL (ref 0.0–0.5)
Eosinophils Relative: 0 %
HCT: 39.9 % (ref 39.0–52.0)
Hemoglobin: 13.6 g/dL (ref 13.0–17.0)
Immature Granulocytes: 2 %
Lymphocytes Relative: 26 %
Lymphs Abs: 1.7 10*3/uL (ref 0.7–4.0)
MCH: 34.2 pg — ABNORMAL HIGH (ref 26.0–34.0)
MCHC: 34.1 g/dL (ref 30.0–36.0)
MCV: 100.3 fL — ABNORMAL HIGH (ref 80.0–100.0)
Monocytes Absolute: 1.5 10*3/uL — ABNORMAL HIGH (ref 0.1–1.0)
Monocytes Relative: 23 %
Neutro Abs: 3.3 10*3/uL (ref 1.7–7.7)
Neutrophils Relative %: 49 %
Platelets: 165 10*3/uL (ref 150–400)
RBC: 3.98 MIL/uL — ABNORMAL LOW (ref 4.22–5.81)
RDW: 14.2 % (ref 11.5–15.5)
WBC: 6.6 10*3/uL (ref 4.0–10.5)
nRBC: 1.2 % — ABNORMAL HIGH (ref 0.0–0.2)

## 2021-11-22 LAB — COMPREHENSIVE METABOLIC PANEL
ALT: 39 U/L (ref 0–44)
AST: 19 U/L (ref 15–41)
Albumin: 4.1 g/dL (ref 3.5–5.0)
Alkaline Phosphatase: 65 U/L (ref 38–126)
Anion gap: 14 (ref 5–15)
BUN: 25 mg/dL — ABNORMAL HIGH (ref 8–23)
CO2: 25 mmol/L (ref 22–32)
Calcium: 9.9 mg/dL (ref 8.9–10.3)
Chloride: 99 mmol/L (ref 98–111)
Creatinine, Ser: 1.36 mg/dL — ABNORMAL HIGH (ref 0.61–1.24)
GFR, Estimated: 58 mL/min — ABNORMAL LOW (ref >60)
Glucose, Bld: 107 mg/dL — ABNORMAL HIGH (ref 70–99)
Potassium: 4 mmol/L (ref 3.5–5.1)
Sodium: 138 mmol/L (ref 135–145)
Total Bilirubin: 0.6 mg/dL (ref 0.3–1.2)
Total Protein: 6.9 g/dL (ref 6.5–8.1)

## 2021-11-22 LAB — SURGICAL PATHOLOGY

## 2021-11-22 MED ORDER — DEXAMETHASONE 4 MG PO TABS
20.0000 mg | ORAL_TABLET | Freq: Once | ORAL | Status: AC
  Administered 2021-11-22: 20 mg via ORAL
  Filled 2021-11-22: qty 5

## 2021-11-22 MED ORDER — ACETAMINOPHEN 325 MG PO TABS
650.0000 mg | ORAL_TABLET | Freq: Once | ORAL | Status: AC
  Administered 2021-11-22: 650 mg via ORAL
  Filled 2021-11-22: qty 2

## 2021-11-22 MED ORDER — DENOSUMAB 120 MG/1.7ML ~~LOC~~ SOLN
120.0000 mg | Freq: Once | SUBCUTANEOUS | Status: AC
  Administered 2021-11-22: 120 mg via SUBCUTANEOUS
  Filled 2021-11-22: qty 1.7

## 2021-11-22 MED ORDER — DEXAMETHASONE 4 MG PO TABS: 20.0000 mg | ORAL_TABLET | ORAL | 0 refills | Status: DC

## 2021-11-22 MED ORDER — DARATUMUMAB-HYALURONIDASE-FIHJ 1800-30000 MG-UT/15ML ~~LOC~~ SOLN
1800.0000 mg | Freq: Once | SUBCUTANEOUS | Status: AC
  Administered 2021-11-22: 1800 mg via SUBCUTANEOUS
  Filled 2021-11-22: qty 15

## 2021-11-22 MED ORDER — DIPHENHYDRAMINE HCL 25 MG PO CAPS
50.0000 mg | ORAL_CAPSULE | Freq: Once | ORAL | Status: AC
  Administered 2021-11-22: 50 mg via ORAL
  Filled 2021-11-22: qty 2

## 2021-11-22 NOTE — Progress Notes (Signed)
Hematology/Oncology  Follow up note [phone] [phone]   Patient Care Team: Tonia Ghent, MD as PCP - General (Family Medicine) Earlie Server, MD as Consulting Physician (Oncology) Beather Arbour Lilyan Punt, MD as Referring Physician (Hematology and Oncology) Diannia Ruder, MD as Referring Physician (Hematology and Oncology)  REASON FOR VISIT Follow up for multiple myeloma.   HISTORY OF PRESENTING ILLNESS:  Luis Burke is a  66 y.o.  male with PMH listed below presents for follow up of multiple myeloma.  # 07/27/2018 multiple myeloma panel showed M protein of 0.1, IgG 662, IgA 49, IgM 7. Patient was called back to further labs done. 08/10/2018, free light chain ratio showed extremely high level of kappa free light chain 10,183, with a kappa lambda light chain ratio of 1414.31 LDH 164 Beta-2 microglobulin 5 Patient was called and discuss about results.  He was recommended to undergo bone marrow biopsy and PET scan. 08/19/2018 bone marrow biopsy showed hypercellular marrow 80%, involved by plasma cell neoplasm up to 95%.  Consistent with plasma cell myeloma. Myeloma FISH  gain of gain of CEP12  09/10/2018 skeletal survey showed questionable small lucent lesions within the midshaft of the humerus bilaterally.  Otherwise no suspicious focal lytic lesion or acute bone abnormality. 08/25/2018 PET scan showed no definite hypermetabolic bone disease but CT findings are highly suspicious for numerous small myelomatous lesions involving the spine, sternum and scattered ribs.  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 He received preparative regimen with melphalan 200 mg/m on 04/22/2019 followed by autologous stem cell infusion on 04/23/2019. Currently he is transfusion independent.  Transfusion criteria with as needed hemoglobin less than 7.5 for platelet less than 10,000.  Transplant course was complicated with febrile neutropenia grade 3,  treated with vancomycin and cephapirin until engraftment on 05/06/2019. Patient also had engraftment syndrome and had to be started on Solu-Medrol 25 mg twice daily on 05/05/2019.  Transition to Medrol Dosepak on 05/07/2019 to complete steroid taper as outpatient.  Patient is currently on acyclovir for 1 year after transplant, dose was reduced on 722 11/20/2018 due to renal function. Patient had a baseline CKD with creatinine running between 1.4-1.6.  He had acute on chronic kidney injury and creatinine went up to 2.2 on 722.  Fluid hydration was given and creatinine decreased to baseline 1.5-1.7. His Hickman catheter was removed.  Candida Cruris treated with topical clotrimazole 1% 3 times daily and to resolution..  #  seen by Dr. Tonia Brooms BMT team on 06/30/2019.revaccination at [place] begins at 12 months post transplant I discussed with [facility] hematology Dr.Choi and he recommends plans as listed below.  -patient to be started on Ixazomib maintenance: -Ixazomib 58m on D1/D8/D15 out of 28 day cycle.  -patient will start re-vaccination 12 months post transplant.  -Post transplant bone marrow biopsy if clinically indicated.  # seen by [facility] bone marrow transplant team in June 2021.  Patient has been started on immunization protocol.  He reports no new complaints.  #[facility]  Lucent lesion in the L4 spinous process measuring 4 mm which is  nonspecific. Consider confirmation of marrow replacing process with MRI.  2.  Incompletely characterized renal cysts   # He continues to have chronic back pain and neck pain.  [facility], MRI lumbar spine with and without contrast showed no evidence of spinal metastasis.  Lumbar spondylosis is most pronounced at L5-S1 where  is severe right and moderate left neural foraminal stenosis. 08/11/2020, x-ray cervical spine complete with flexion and extension showed The cervical spine is visualized from C1 to the  top of T1 on the lateral  view.  Reversal of the normal cervical lordosis with a focal kyphosis centered at the C5-C6 intervertebral level. Trace anterolisthesis of C4 onto C5. Trace retrolisthesis of C6 on C7. No evidence of dynamic instability is noted on  the flexion or extension views.   No prevertebral soft tissue swelling. Cervical vertebral body heights are maintained. Multilevel degenerative changes of the cervical spine including discogenic disease, most pronounced at C5-C6 and C6-C7, and facet arthropathy most pronounced at C4-C5. Multilevel mild to moderate left neural foraminal osseous narrowing of the mid to lower cervical vertebrae, possibly centrally/artifactual by patient positioning.  #07/28/2020 bone marrow normocellular marrow with polytypic plasmacytosis, 3 to 8%   Patient is in remission.  Cytogenetics negative for trisomy 12.  Negative myeloma FISH panel.   # Lumbar lesion,  11/13/20 [facility]  MRI cervical sine w/wo contrast - no evidence of metastatic disease C3-4 severe spinal stenosis, left neural foraminal narrowing. C4-5 spinal canal stenosis. [facility] MRI results showed no metastatic spine lesion. Chronic back pain and neck pain, images are consistent with chronic degenerative disease.  Patient continues to follow-up with [facility] orthopedic surgeon.  04/03/2021, light chain ratio increased to 3.76.   05/15/2021 bone marrow biopsy showed slightly hypercellular bone marrow for age with slight plasmacytosis.  5% plasma cells with kappa light chain restriction.  Cytogenetics normal.  Myeloma FISH negative # 05/15/2021 bone marrow biopsy showed slightly hypercellular bone marrow for age with slight plasmacytosis.  5% plasma cells with kappa light chain restriction.  Cytogenetics normal.  Myeloma FISH negative  # 05/31/2021 Daratumumab -Pomalyst-dexamethasone-D1 patient had grade 2 infusion reaction to daratumumab and received steroids, Benadryl, famotidine.  He was able to finish treatment.D8  was able to finish Daratumumab with no infusion reactions.  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  Patient follows with podiatry Dr. Amalia Hailey for stress reaction fracture of the second metatarsal right.  He was cleared by Dr. Amalia Hailey to go back to [facility].    08/16/21 Skeletal survey showed stable small right humeral and scapular lucencies.  Ill-defined small lucencies in the proximal right humeral shaft more conspicuous than on prior study of 08/10/2018. 09/21/2021, PET scan showed no evidence of suspicious or new hypermetabolism, scattered small lucent foci seen previously in the sternum and the spine are less prominent today and have resolved in some regions.  Aortic atherosclerosis.  INTERVAL HISTORY Luis Burke is a 66 y.o. male who has above history reviewed by me today for follow-up  Currently on Daratumumab Pomalyst dexamethasone Patient went to the emergency room on 11/18/2021 for neck pain evaluation.  11/18/2021, MRI cervical spine with and without contrast showed no multiple myeloma lesion or marrow edema identified in the cervical spine.  Abnormal dural thickening and a/or abnormal epidural space at C4/C5 levels.  This is nonspecific.  ER physician discussed the case with neurosurgery Dr. Cari Caraway who feels that this is more likely degenerative disc disease.  Recommend steroids.  Patient was given a tapering course of prednisone. Patient feels some improvement of the neck pain today.  11/20/2021, status post bone marrow biopsy.  Results are pending.  Review of Systems  Constitutional:  Negative for appetite change, chills, fatigue, fever and unexpected weight change.  HENT:   Negative for hearing loss.   Eyes:  Negative for eye problems  and icterus.  Respiratory:  Negative for chest tightness, cough and shortness of breath.   Cardiovascular:  Negative for chest pain and leg swelling.  Gastrointestinal:  Negative for abdominal distention and abdominal pain.  Endocrine: Negative for hot  flashes.  Genitourinary:  Negative for difficulty urinating, dysuria and frequency.   Musculoskeletal:  Positive for back pain and neck pain. Negative for arthralgias.  Skin:  Negative for itching and rash.  Neurological:  Negative for light-headedness and numbness.  Hematological:  Negative for adenopathy. Does not bruise/bleed easily.  Psychiatric/Behavioral:  Negative for confusion.    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   Fatty liver    on u/s 07/2018   Hyperlipidemia 03/2004   Hypertension 1994   Multiple myeloma (HCC)    Snores     SURGICAL HISTORY: Past Surgical History:  Procedure Laterality Date   CATARACT EXTRACTION Left 10/10/2021   CATARACT EXTRACTION W/PHACO Right 03/04/2018   Procedure: CATARACT EXTRACTION PHACO AND INTRAOCULAR LENS PLACEMENT (IOC)  RIGHT TORIC;  Surgeon: Lockie Mola, MD;  [facility];  Service: Ophthalmology;  Laterality: Right;  [place] no Toric Lens 1:45 5.3   CATARACT EXTRACTION W/PHACO Left 10/10/2021   Procedure: CATARACT EXTRACTION PHACO AND INTRAOCULAR LENS PLACEMENT (IOC) LEFT;  Surgeon: Lockie Mola, MD;  Location[facility];  Service: Ophthalmology;  Laterality: Left;  5.16 1:03.8   COLONOSCOPY WITH PROPOFOL N/A 07/20/2018   Procedure: COLONOSCOPY WITH PROPOFOL;  Surgeon: Wyline Mood, MD;  Location: [facility] ENDOSCOPY;  Service: Gastroenterology;  Laterality: N/A;   ESOPHAGOGASTRODUODENOSCOPY (EGD) WITH PROPOFOL N/A 07/20/2018   Procedure: ESOPHAGOGASTRODUODENOSCOPY (EGD) WITH PROPOFOL;  Surgeon: Wyline Mood, MD;  Location[facility];  Service: Gastroenterology;  Laterality: N/A;   EYE SURGERY Right    HERNIA REPAIR     L Lap herniorraphy  04/2000   Left wrist ganglionectomy      SOCIAL HISTORY: Social History    Socioeconomic History   Marital status: Married    Spouse name: Not on file   Number of children: 1   Years of education: Not on file   Highest education level: Not on file  Occupational History   Occupation: [employer]  Tobacco Use   Smoking status: Never   Smokeless tobacco: Never   Tobacco comments:    Occassionally  Vaping Use   Vaping Use: Never used  Substance and Sexual Activity   Alcohol use: Not Currently    Alcohol/week: 3.0 standard drinks    Types: 3 Cans of beer per week   Drug use: No   Sexual activity: Not on file  Other Topics Concern   Not on file  Social History Narrative   Divorced 2009, married 06-05-21.     His daughter died 4 days after giving birth to the patient's granddaughter   Has joint custody of his deceased daughter's child   Worked at [employer] is [place], retired 2020.     Social Determinants of Health   Financial Resource Strain: Not on file  Food Insecurity: Not on file  Transportation Needs: Not on file  Physical Activity: Not on file  Stress: Not on file  Social Connections: Not on file  Intimate Partner Violence: Not on file    FAMILY HISTORY: Family History  Problem Relation Age of Onset  Hypertension Mother    Diabetes Mother    Heart disease Mother        CAD   Dementia Mother    Prostate cancer Father    Hypertension Father    Heart disease Father        MI 02/03   Colon cancer Father    Hypertension Sister    Hypertension Sister    Hypertension Sister     ALLERGIES:  is allergic to bactrim [sulfamethoxazole-trimethoprim], clonidine derivatives, and tadalafil.  MEDICATIONS:  Current Outpatient Medications  Medication Sig Dispense Refill   acetaminophen (TYLENOL) 325 MG tablet Take 2 tablets (650 mg total) by mouth See admin instructions. 1 hour prior to chemotherapy treatments 30 tablet 0   amLODipine (NORVASC) 5 MG tablet TAKE 2 TABLETS BY MOUTH EVERY DAY 180 tablet 1   ASPIRIN 81 PO Take 81 mg by  mouth daily.     B Complex Vitamins (VITAMIN B COMPLEX PO) Take 1 Dose by mouth daily.     chlorhexidine (PERIDEX) 0.12 % solution Use as directed 15 mLs in the mouth or throat 2 (two) times daily. 473 mL 1   co-enzyme Q-10 50 MG capsule Take 50 mg by mouth daily.     [employer] VITAMIN B12 1000 MCG tablet TAKE 1 TABLET BY MOUTH EVERY DAY 90 tablet 3   cyclobenzaprine (FLEXERIL) 5 MG tablet Take 1 tablet (5 mg total) by mouth 3 (three) times daily as needed for up to 7 days for muscle spasms. 21 tablet 0   Denosumab (XGEVA [place]) Inject into the skin every 30 (thirty) days. Last rcvd 09/27/21     diphenhydrAMINE (BENADRYL) 50 MG tablet Take 1 tablet (50 mg total) by mouth See admin instructions. Take 1 tablet 1 hour prior to chemotherapy treatments. 30 tablet 0   hydrALAZINE (APRESOLINE) 10 MG tablet TAKE 1 TABLET(10 MG) BY MOUTH THREE TIMES DAILY 270 tablet 1   lisinopril (ZESTRIL) 20 MG tablet TAKE 1 TABLET BY MOUTH EVERY DAY 90 tablet 2   loperamide (IMODIUM) 2 MG capsule Take 1 capsule (2 mg total) by mouth See admin instructions. Take 2 capsules at the onset of diarrhea, then 1 capsule every 2 hours or after every loose bowel movement. Maximum 8 capsules per 24 hours. 60 capsule 0   LORazepam (ATIVAN) 1 MG tablet TAKE 1 TABLET (1 MG TOTAL) BY MOUTH EVERY 6 (SIX) HOURS AS NEEDED FOR ANXIETY (FOR NAUSEA)     magnesium chloride (SLOW-MAG) 64 MG TBEC SR tablet Take 1 tablet (64 mg total) by mouth daily. 60 tablet 0   metoprolol succinate (TOPROL-XL) 50 MG 24 hr tablet TAKE 4 TABLETS BY MOUTH EVERY DAY, TAKE WITH OR IMMEDIATELY FOLLOWING A MEAL 360 tablet 2   montelukast (SINGULAIR) 10 MG tablet Take 1 tablet (10 mg total) by mouth See admin instructions. Take 1day prior to chemotherapy 30 tablet 1   ondansetron (ZOFRAN) 8 MG tablet Take 1 tablet (8 mg total) by mouth every 8 (eight) hours as needed. for nausea 30 tablet 1   pomalidomide (POMALYST) 2 MG capsule Take 1 capsule (2 mg total) by mouth daily. Take  for 21 days on, then hold for 7 days. Repeat every 28 days. 21 capsule 0   pravastatin (PRAVACHOL) 10 MG tablet TAKE 1 TABLET BY MOUTH EVERY DAY 90 tablet 3   predniSONE (STERAPRED UNI-PAK 21 TAB) 10 MG (21) TBPK tablet Take as directed per day 1 each 0   prochlorperazine (COMPAZINE) 10 MG tablet  sildenafil (REVATIO) 20 MG tablet Take 1-5 tablets (20-100 mg total) by mouth daily as needed. 50 tablet 12   zinc gluconate 50 MG tablet Take 50 mg by mouth daily.     dexamethasone (DECADRON) 4 MG tablet Take 5 tablets (20 mg total) by mouth once a week. Take at least 1 hour prior to infusion appt. Take with food. To start on 06/07/21. 60 tablet 0   No current facility-administered medications for this visit.   Facility-Administered Medications Ordered in Other Visits  Medication Dose Route Frequency Provider Last Rate Last Admin   0.9 %  sodium chloride infusion   Intravenous Once Earlie Server, MD         PHYSICAL EXAMINATION: ECOG PERFORMANCE STATUS: 1 - Symptomatic but completely ambulatory Vitals:   11/22/21 0829  BP: (!) 149/84  Pulse: 64  Resp: 20  Temp: 98.7 F (37.1 C)  SpO2: 100%   Filed Weights   11/22/21 0829  Weight: 209 lb 3.2 oz (94.9 kg)    Physical Exam Constitutional:      General: He is not in acute distress. HENT:     Head: Normocephalic and atraumatic.  Eyes:     General: No scleral icterus. Cardiovascular:     Rate and Rhythm: Normal rate and regular rhythm.     Heart sounds: Normal heart sounds.  Pulmonary:     Effort: Pulmonary effort is normal. No respiratory distress.     Breath sounds: No wheezing.  Abdominal:     General: Bowel sounds are normal. There is no distension.     Palpations: Abdomen is soft.  Musculoskeletal:        General: No deformity. Normal range of motion.     Cervical back: Normal range of motion and neck supple.  Skin:    General: Skin is warm and dry.     Findings: No erythema or rash.  Neurological:     Mental Status: He  is alert and oriented to person, place, and time. Mental status is at baseline.     Cranial Nerves: No cranial nerve deficit.     Coordination: Coordination normal.  Psychiatric:        Mood and Affect: Mood normal.     LABORATORY DATA:  I have reviewed the data as listed Lab Results  Component Value Date   WBC 6.6 11/22/2021   HGB 13.6 11/22/2021   HCT 39.9 11/22/2021   MCV 100.3 (H) 11/22/2021   PLT 165 11/22/2021   Recent Labs    10/25/21 0758 11/08/21 0758 11/18/21 0606 11/22/21 0817  NA 135 136 137 138  K 4.3 3.9 4.0 4.0  CL 104 103 108 99  CO2 $Re'24 25 23 25  'jfk$ GLUCOSE 102* 117* 105* 107*  BUN 19 22 24* 25*  CREATININE 1.46* 1.32* 1.31* 1.36*  CALCIUM 9.0 9.4 9.0 9.9  GFRNONAA 53* 60* >60 58*  PROT 6.8 7.2  --  6.9  ALBUMIN 4.3 4.5  --  4.1  AST 15 15  --  19  ALT 17 17  --  39  ALKPHOS 60 58  --  65  BILITOT 0.7 1.2  --  0.6    Iron/TIBC/Ferritin/ %Sat    Component Value Date/Time   IRON 74 07/01/2018 0944   TIBC 250 07/01/2018 0944   FERRITIN 357 07/01/2018 0944   IRONPCTSAT 30 07/01/2018 0944    Lab Results  Component Value Date   TOTALPROTELP 6.4 11/08/2021   ALBUMINELP 4.0 03/08/2021   A1GS  0.2 03/08/2021   A2GS 0.7 03/08/2021   BETS 1.1 03/08/2021   GAMS 1.1 03/08/2021   MSPIKE Not Observed 03/08/2021   SPEI Comment 03/08/2021   Lab Results  Component Value Date   KPAFRELGTCHN 20.7 (H) 11/08/2021   LAMBDASER 15.6 11/08/2021   KAPLAMBRATIO 1.33 11/08/2021    RADIOGRAPHIC STUDIES: I have personally reviewed the radiological images as listed and agreed with the findings in the report. MR Cervical Spine W or Wo Contrast  Result Date: 11/18/2021 CLINICAL DATA:  66 year old male with multiple myeloma. Neck pain. EXAM: MRI CERVICAL SPINE WITHOUT AND WITH CONTRAST TECHNIQUE: Multiplanar and multiecho pulse sequences of the cervical spine, to include the craniocervical junction and cervicothoracic junction, were obtained without and with intravenous  contrast. CONTRAST:  66mL GADAVIST GADOBUTROL 1 MMOL/ML IV SOLN COMPARISON:  PET-CT 09/20/2021. Cervical spine CT 09/27/2009. FINDINGS: Alignment: Chronic straightening of cervical lordosis has not significantly changed since 2010, including trace retrolisthesis of C5 on C6. Vertebrae: Visible bone marrow signal is within normal limits. No acute or suspicious osseous lesion identified. Pronounced degenerative osseous changes at the left C4-C5 facet (see below), C5-C6 endplates. Cord: No spinal cord signal abnormality despite some degenerative cord mass effect. No abnormal intradural enhancement. However, there is circumferential abnormal dural thickening and enhancement at the C4 and C5 levels (series 12, image 14), and eccentric to the left and posteriorly at C4 (series 12, image 10). On noncontrast images the dura and epidural space here is T2 hyperintense and T1 hypointense. And there appears to be asymmetric epidural enhancement also at the left C4 and C5 neural foramina. See additional details below. Posterior Fossa, vertebral arteries, paraspinal tissues: Cervicomedullary junction is within normal limits. Negative visible posterior fossa. Preserved major vascular flow voids in the neck. Patchy STIR hyperintensity in the interspinous ligament at C3-C4, but no associated enhancement and other visible neck soft tissues appear negative. Negative visible left lung apex. Disc levels: C2-C3: Foraminal disc bulging and endplate spurring greater on the left. No significant stenosis. C3-C4: Moderate to severe right side facet hypertrophy. Mild ligament flavum hypertrophy. Rightward disc bulging and endplate spurring. Mild spinal stenosis. Up to mild cord mass effect. Moderate to severe right C4 foraminal stenosis. C4-C5: Mild-to-moderate right but moderate to severe left facet hypertrophy. Degenerative appearing left facet joint fluid. Mild disc bulging and endplate spurring. Mild spinal stenosis when combined with the  abnormal dural thickening and/or epidural space here. Mild spinal cord mass effect. Moderate left and mild right C5 foraminal stenosis. C5-C6: Abnormal dural/epidural space continues to this level. Mild retrolisthesis with disc space loss and circumferential disc osteophyte complex. Negative facets. Mild spinal stenosis and spinal cord mass effect. Moderate to severe bilateral C6 foraminal stenosis. C6-C7: Disc space loss with circumferential disc osteophyte complex. No spinal stenosis. Moderate bilateral C7 foraminal stenosis. C7-T1: Mild anterolisthesis. Moderate facet hypertrophy greater on the right. Mild endplate spurring. No spinal or significant foraminal stenosis. Negative visible upper thoracic levels. IMPRESSION: 1. No multiple myeloma lesion or marrow edema identified in the cervical spine. But there is abnormal dural thickening and/or abnormal epidural space at the C4 and C5 levels. But this is nonspecific. Trace epidural hematoma is possible. Malignant dural thickening is difficult to exclude. Degenerative/inflammatory dural thickening is felt less likely, and there is no strong evidence of acute spinal infection. Inflammation appears to involve the exiting left C4 and C5 nerve roots. 2. Underlying cervical spine degeneration, including chronic moderate to severe facet arthropathy at C3-C4 and C4-C5. Degenerative appearing facet  joint effusion on the left at the latter. Multilevel mild degenerative spinal stenosis and spinal cord mass effect C3-C4 through C5-C6. No spinal cord signal abnormality identified. Moderate and occasionally severe severe degenerative neural foraminal stenosis at the right C4, left C5, bilateral C6 and C7 nerve levels. Electronically Signed   By: Genevie Ann M.D.   On: 11/18/2021 06:59   NM PET Image Restage (PS) Whole Body (F-18 FDG)  Result Date: 09/21/2021 CLINICAL DATA:  Subsequent treatment strategy for multiple myeloma. EXAM: NUCLEAR MEDICINE PET WHOLE BODY TECHNIQUE: 11.3  mCi F-18 FDG was injected intravenously. Full-ring PET imaging was performed from the head to foot after the radiotracer. CT data was obtained and used for attenuation correction and anatomic localization. Fasting blood glucose: 138 mg/dl COMPARISON:  08/25/2018 FINDINGS: Mediastinal blood pool activity: SUV max 2.6 HEAD/NECK: No hypermetabolic activity in the scalp. No hypermetabolic cervical lymph nodes. Incidental CT findings: none CHEST: No hypermetabolic mediastinal or hilar nodes. No suspicious pulmonary nodules on the CT scan. Incidental CT findings: Mild atherosclerotic calcification is noted in the wall of the thoracic aorta. Dependent atelectasis noted in the lung bases. ABDOMEN/PELVIS: No abnormal hypermetabolic activity within the liver, pancreas, adrenal glands, or spleen. No hypermetabolic lymph nodes in the abdomen or pelvis. FDG accumulation in the region of the left groin herniorrhaphy has decreased in the interval with SUV max = 5.6 today compared to 7.6 previously. These findings are likely related to postsurgical change. Incidental CT findings: Bilateral renal cysts are again noted. 5.0 cm interpolar left renal cyst has increased in size from 3.4 cm previously but appears photopenic on PET imaging. There is mild atherosclerotic calcification of the abdominal aorta without aneurysm. SKELETON: No focal hypermetabolic activity to suggest skeletal metastasis. Incidental CT findings: The scattered tiny lucent bone lesion seen on the previous PET-CT involving the sternum, right humeral head, and spine are less prominent today and appear resolved in most locations. No suspicious bony hypermetabolism on today's study. Interval continued further healing of the anterior left second rib fracture identified previously. EXTREMITIES: No abnormal hypermetabolic activity in the lower extremities. Incidental CT findings: none IMPRESSION: 1. No evidence for suspicious or new hypermetabolism on today's study. 2.  The scattered small lucent foci seen previously in the sternum and spine are less prominent today and have resolved in some regions. 3.  Aortic Atherosclerois (ICD10-170.0) Electronically Signed   By: Misty Stanley M.D.   On: 09/21/2021 15:25   DG Shoulder Left  Result Date: 11/18/2021 CLINICAL DATA:  Acute on chronic left shoulder pain. EXAM: LEFT SHOULDER - 2+ VIEW COMPARISON:  None. FINDINGS: No evidence for an acute fracture. No subluxation or dislocation. Mild degenerative changes noted AC joint. No suspicious lytic or sclerotic osseous abnormality. IMPRESSION: AC joint degeneration.  No acute bony abnormality. Electronically Signed   By: Misty Stanley M.D.   On: 11/18/2021 07:12   DG Foot Complete Right  Result Date: 10/30/2021 Please see detailed radiograph report in office note.  CT BONE MARROW BIOPSY & ASPIRATION  Result Date: 11/20/2021 INDICATION: Myeloma workup EXAM: CT GUIDED RIGHT ILIAC BONE MARROW ASPIRATION AND CORE BIOPSY Date:  11/20/2021 11/20/2021 9:51 am Radiologist:  M. Daryll Brod, MD Guidance:  CT FLUOROSCOPY: Fluoroscopy Time: None. MEDICATIONS: 1% lidocaine local ANESTHESIA/SEDATION: 2.0 mg IV Versed; 100 mcg IV Fentanyl Moderate Sedation Time:  10 minute The patient was continuously monitored during the procedure by the interventional radiology nurse under my direct supervision. CONTRAST:  None. COMPLICATIONS: None PROCEDURE: Informed consent was  obtained from the patient following explanation of the procedure, risks, benefits and alternatives. The patient understands, agrees and consents for the procedure. All questions were addressed. A time out was performed. The patient was positioned prone and non-contrast localization CT was performed of the pelvis to demonstrate the iliac marrow spaces. Maximal barrier sterile technique utilized including caps, mask, sterile gowns, sterile gloves, large sterile drape, hand hygiene, and Betadine prep. Under sterile conditions and local  anesthesia, an 11 gauge coaxial bone biopsy needle was advanced into the right iliac marrow space. Needle position was confirmed with CT imaging. Initially, bone marrow aspiration was performed. Next, the 11 gauge outer cannula was utilized to obtain a right iliac bone marrow core biopsy. Needle was removed. Hemostasis was obtained with compression. The patient tolerated the procedure well. Samples were prepared with the cytotechnologist. No immediate complications. IMPRESSION: CT guided right iliac bone marrow aspiration and core biopsy. Electronically Signed   By: Jerilynn Mages.  Shick M.D.   On: 11/20/2021 10:33      ASSESSMENT & PLAN:  1. Encounter for antineoplastic chemotherapy   2. Thrombocytopenia ([place])   3. Multiple myeloma in relapse ([facility])   4. Stage 3a chronic kidney disease ([place])   5. Bone lesion   6. Neck pain    #Light chain multiple myeloma beta 2 microglobulin 5 and normal albumin.  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  06/04/2021 5% plasma cells Labs are reviewed and discussed with patient Procedure visit cycle 7-day 1 daratumumab, .  Continue dexamethasone 20 mg weekly.  Starting this cycle, Daratumumab will be monthly.  Recommend patient to start the cycle of Pomalyst 3 weeks on 1 week off. Bone marrow biopsy result is pending.  #Thrombocytopenia, chemotherapy-induced.  Monitor. #Lucent lesions in the bone. Majority of his lucencies were not hypermetabolic on previous PET scan Continue Xgeva monthly   #Chronic thrombocytopenia, platelet count has increased.  Likely reactive. # CKD, avoid nephrotoxin.  Encourage hydration.  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  MRI cervical spine was reviewed. Refer to physical therapist.  Post bone marrow  transplant care He has completed 1 year of acyclovir.  Off acyclovir now. Post transplant vaccination finished at [facility].   #Bone health/osteopenia/multiple myeloma-  Continue Xgeva monthly Continue calcium and vitamin D supplementation. Follow up 4 weeks for Lab MD Daratumumab treatment.   Earlie Server, MD, PhD 11/22/2021

## 2021-11-22 NOTE — Patient Instructions (Signed)
[facility] AT [place]  Discharge Instructions: Thank you for choosing [facility] to provide your oncology and hematology care.  If you have a lab appointment with the [facility], please go directly to the [facility] and check in at the registration area.  Wear comfortable clothing and clothing appropriate for easy access to any Portacath or PICC line.   We strive to give you quality time with your provider. You may need to reschedule your appointment if you arrive late (15 or more minutes).  Arriving late affects you and other patients whose appointments are after yours.  Also, if you miss three or more appointments without notifying the office, you may be dismissed from the clinic at the providers discretion.      For prescription refill requests, have your pharmacy contact our office and allow 72 hours for refills to be completed.    Today you received the following chemotherapy and/or immunotherapy agents - daratumumab      To help prevent nausea and vomiting after your treatment, we encourage you to take your nausea medication as directed.  BELOW ARE SYMPTOMS THAT SHOULD BE REPORTED IMMEDIATELY: *FEVER GREATER THAN 100.4 F (38 C) OR HIGHER *CHILLS OR SWEATING *NAUSEA AND VOMITING THAT IS NOT CONTROLLED WITH YOUR NAUSEA MEDICATION *UNUSUAL SHORTNESS OF BREATH *UNUSUAL BRUISING OR BLEEDING *URINARY PROBLEMS (pain or burning when urinating, or frequent urination) *BOWEL PROBLEMS (unusual diarrhea, constipation, pain near the anus) TENDERNESS IN MOUTH AND THROAT WITH OR WITHOUT PRESENCE OF ULCERS (sore throat, sores in mouth, or a toothache) UNUSUAL RASH, SWELLING OR PAIN  UNUSUAL VAGINAL DISCHARGE OR ITCHING   Items with * indicate a potential emergency and should be followed up as soon as possible or go to the Emergency Department if any problems should occur.  Please show the CHEMOTHERAPY ALERT CARD or IMMUNOTHERAPY ALERT CARD at check-in  to the Emergency Department and triage nurse.  Should you have questions after your visit or need to cancel or reschedule your appointment, please contact [facility] [place] AT [place]  [phone] and follow the prompts.  Office hours are 8:00 a.m. to 4:30 p.m. Monday - Friday. Please note that voicemails left after 4:00 p.m. may not be returned until the following business day.  We are closed weekends and major holidays. You have access to a nurse at all times for urgent questions. Please call the main number to the clinic [phone] and follow the prompts.  For any non-urgent questions, you may also contact your provider using MyChart. We now offer e-Visits for anyone 48 and older to request care online for non-urgent symptoms. For details visit [email or web address].   Also download the MyChart app! Go to the app store, search "MyChart", open the app, select [facility], and log in with your MyChart username and password.  Due to Covid, a mask is required upon entering the hospital/clinic. If you do not have a mask, one will be given to you upon arrival. For doctor visits, patients may have 1 support person aged 64 or older with them. For treatment visits, patients cannot have anyone with them due to current Covid guidelines and our immunocompromised population.   Daratumumab; Hyaluronidase Injection What is this medication? DARATUMUMAB; HYALURONIDASE (dar a toom ue mab / hye al ur ON i dase) is a monoclonal antibody. Hyaluronidase is used to improve the effects of daratumumab. It treats certain types of cancer. Some of the cancers treated are multiple myeloma and light-chain amyloidosis. This medicine may  be used for other purposes; ask your health care provider or pharmacist if you have questions. COMMON BRAND NAME(S): DARZALEX FASPRO What should I tell my care team before I take this medication? They need to know if you have any of these conditions: heart  disease infection especially a viral infection such as chickenpox, cold sores, herpes, or hepatitis B lung or breathing disease an unusual or allergic reaction to daratumumab, hyaluronidase, other medicines, foods, dyes, or preservatives pregnant or trying to get pregnant breast-feeding How should I use this medication? This medicine is for injection under the skin. It is given by a health care professional in a hospital or clinic setting. Talk to your pediatrician regarding the use of this medicine in children. Special care may be needed. Overdosage: If you think you have taken too much of this medicine contact a poison control center or emergency room at once. NOTE: This medicine is only for you. Do not share this medicine with others. What if I miss a dose? Keep appointments for follow-up doses as directed. It is important not to miss your dose. Call your doctor or health care professional if you are unable to keep an appointment. What may interact with this medication? Interactions have not been studied. This list may not describe all possible interactions. Give your health care provider a list of all the medicines, herbs, non-prescription drugs, or dietary supplements you use. Also tell them if you smoke, drink alcohol, or use illegal drugs. Some items may interact with your medicine. What should I watch for while using this medication? Your condition will be monitored carefully while you are receiving this medicine. This medicine can cause serious allergic reactions. To reduce your risk, your health care provider may give you other medicine to take before receiving this one. Be sure to follow the directions from your health care provider. This medicine can affect the results of blood tests to match your blood type. These changes can last for up to 6 months after the final dose. Your healthcare provider will do blood tests to match your blood type before you start treatment. Tell all of your  healthcare providers that you are being treated with this medicine before receiving a blood transfusion. This medicine can affect the results of some tests used to determine treatment response; extra tests may be needed to evaluate response. Do not become pregnant while taking this medicine or for 3 months after stopping it. Women should inform their health care provider if they wish to become pregnant or think they might be pregnant. There is a potential for serious side effects to an unborn child. Talk to your health care provider for more information. Do not breast-feed an infant while taking this medicine. What side effects may I notice from receiving this medication? Side effects that you should report to your care team as soon as possible: Allergic reactions--skin rash, itching or hives, swelling of the face, lips, or tongue Blood clot--chest pain, shortness of breath, pain, swelling or warmth in the leg Blurred vision Fast, irregular heartbeat Infection--fever, chills, cough, sore throat, pain or trouble passing urine Injection reactions--dizziness, fast heartbeat, feeling faint or lightheaded, falls, headache, increase in blood pressure, nausea, vomiting, or wheezing or trouble breathing with loud or whistling sounds Low red blood cell counts--trouble breathing, feeling faint, lightheaded or falls, unusually weak or tired Unusual bleeding or bruising Side effects that usually do not require medical attention (report these to your care team if they continue or are bothersome): Back pain  Constipation Diarrhea Pain, tingling, numbness in the hands or feet Pain, redness, or irritation at site where injected Muscle cramp or pain Swelling of the ankles, feet, hands Tiredness Trouble sleeping This list may not describe all possible side effects. Call your doctor for medical advice about side effects. You may report side effects to FDA at 1-800-FDA-1088. Where should I keep my  medication? This drug is given in a hospital or clinic and will not be stored at home. NOTE: This sheet is a summary. It may not cover all possible information. If you have questions about this medicine, talk to your doctor, pharmacist, or health care provider.  2022 Elsevier/Gold Standard (2021-06-19 00:00:00)

## 2021-11-22 NOTE — Telephone Encounter (Signed)
-----   Message from Earlie Server, MD sent at 11/22/2021  1:33 PM EST ----- Please send a new referral of physical therapy- neck pain, degenerative disease. He knows

## 2021-11-22 NOTE — Telephone Encounter (Signed)
Referral entered in [facility].

## 2021-11-22 NOTE — Progress Notes (Signed)
He ran out of dexamethasone and has not taken home supply today. Will give dose per treatment plan orders 1 hour prior to daratumumab.

## 2021-11-22 NOTE — Progress Notes (Signed)
Patient states no concerns at the moment. Patient is in pain today from him disc, pain level is 3 today. Patient wants a referral for physical therapy.

## 2021-11-23 ENCOUNTER — Encounter: Payer: Self-pay | Admitting: Oncology

## 2021-11-23 NOTE — Telephone Encounter (Signed)
Please advise 

## 2021-11-28 ENCOUNTER — Ambulatory Visit: Payer: Medicare Other | Admitting: Physical Therapy

## 2021-11-29 ENCOUNTER — Encounter (HOSPITAL_COMMUNITY): Payer: Self-pay | Admitting: Oncology

## 2021-12-03 ENCOUNTER — Encounter: Payer: Self-pay | Admitting: Oncology

## 2021-12-03 ENCOUNTER — Encounter (HOSPITAL_COMMUNITY): Payer: Self-pay | Admitting: Oncology

## 2021-12-03 NOTE — Telephone Encounter (Signed)
Please advise 

## 2021-12-13 ENCOUNTER — Other Ambulatory Visit: Payer: Self-pay | Admitting: *Deleted

## 2021-12-13 DIAGNOSIS — C9000 Multiple myeloma not having achieved remission: Secondary | ICD-10-CM

## 2021-12-14 ENCOUNTER — Encounter: Payer: Self-pay | Admitting: Oncology

## 2021-12-17 ENCOUNTER — Other Ambulatory Visit: Payer: Self-pay

## 2021-12-17 ENCOUNTER — Ambulatory Visit: Payer: Medicare Other | Attending: Neurosurgery | Admitting: Physical Therapy

## 2021-12-17 ENCOUNTER — Encounter: Payer: Self-pay | Admitting: Physical Therapy

## 2021-12-17 DIAGNOSIS — M5412 Radiculopathy, cervical region: Secondary | ICD-10-CM | POA: Insufficient documentation

## 2021-12-17 DIAGNOSIS — M6281 Muscle weakness (generalized): Secondary | ICD-10-CM | POA: Diagnosis present

## 2021-12-17 DIAGNOSIS — M542 Cervicalgia: Secondary | ICD-10-CM | POA: Insufficient documentation

## 2021-12-17 NOTE — Therapy (Signed)
[place] [address], [place], [address] Phone: [phone]   Fax:  [phone]  Physical Therapy Evaluation  Patient Details  Name: Luis Burke MRN: [id number] Date of Birth: 1956/05/18 No data recorded  Encounter Date: 12/17/2021   PT End of Session - 12/17/21 1748     Visit Number 1    Number of Visits 17    Date for PT Re-Evaluation 02/11/22    Progress Note Due on Visit 10    PT Start Time 0920    PT Stop Time 1005    PT Time Calculation (min) 45 min    Activity Tolerance Patient tolerated treatment well    Behavior During Therapy [facility] for tasks assessed/performed             Past Medical History:  Diagnosis Date   AKI (acute kidney injury) ([place]) 01/06/2019   Anemia    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   Fatty liver    on u/s 07/2018   Hyperlipidemia 03/2004   Hypertension 1994   Multiple myeloma ([place])    Snores     Past Surgical History:  Procedure Laterality Date   CATARACT EXTRACTION Left 10/10/2021   CATARACT EXTRACTION W/PHACO Right 03/04/2018   Procedure: CATARACT EXTRACTION PHACO AND INTRAOCULAR LENS PLACEMENT ([place])  RIGHT TORIC;  Surgeon: Leandrew Koyanagi, MD;  Location: [place];  Service: Ophthalmology;  Laterality: Right;  [place] no Toric Lens 1:45 5.3   CATARACT EXTRACTION W/PHACO Left 10/10/2021   Procedure: CATARACT EXTRACTION PHACO AND INTRAOCULAR LENS PLACEMENT ([place]) LEFT;  Surgeon: Leandrew Koyanagi, MD;  Location: [place];  Service: Ophthalmology;  Laterality: Left;  5.16 1:03.8   COLONOSCOPY WITH PROPOFOL N/A 07/20/2018   Procedure: COLONOSCOPY WITH PROPOFOL;  Surgeon: Jonathon Bellows, MD;  Location: [facility] ENDOSCOPY;  Service: Gastroenterology;  Laterality: N/A;   ESOPHAGOGASTRODUODENOSCOPY (EGD) WITH PROPOFOL N/A 07/20/2018   Procedure: ESOPHAGOGASTRODUODENOSCOPY (EGD)  WITH PROPOFOL;  Surgeon: Jonathon Bellows, MD;  Location: [facility] ENDOSCOPY;  Service: Gastroenterology;  Laterality: N/A;   EYE SURGERY Right    HERNIA REPAIR     L Lap herniorraphy  04/2000   Left wrist ganglionectomy      There were no vitals filed for this visit.    Subjective Assessment - 12/17/21 0932     How long can you sit comfortably? hours    How long can you stand comfortably? hours    How long can you walk comfortably? 30    Patient Stated Goals take some of the soreness out of left neck/shoulder    Currently in Pain? Yes    Pain Score 5     Pain Location Neck    Pain Orientation Left    Pain Descriptors / Indicators Sharp    Pain Type Chronic pain    Pain Radiating Towards LUE - trap    Pain Onset More than a month ago    Pain Frequency Intermittent               SUBJECTIVE  Chief complaint: Pt arrives to PT with radiating pain from cervical spine into left upper extremity. Pt is familiar to this clinic for similar symptoms. He states radiating pain typically extends into the upper trap. Recently on one occasion, pain radiated full length of arm; pt went to ER due to significant pain and concern of stroke. Stroke ruled out, pt referred  to PT for new significant weakness within [place] joint and is unable to lift his arm above his head in frontal and sagittal planes. At worst, pain is 8/10, best 0/10. 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 Patient is receiving maintenance chemo for multiple myeloma 1x/month with next treatment scheduled for this Thursday. Patient states that pain improved significantly following last bout of PT. He states he continued his HEP for a little while but stopped and that is when the pain returned. Pt has a past medical history of bone marrow transplant; he is followed by [facility] oncology Dr. Tasia Catchings. Other PMH includes HTN, hyperlipidemia, lumbar stenosis, and anemia. Pt denies any unexplained weight fluctuation, saddle paresthesia, loss  of bowel/bladder function, or unrelenting night pain at this time.  Onset: >1 month  Referring Dx: cervical radiculopathy (C4 and C5 nerve roots), left arm weakness  MD: Dr. Elsie Stain  Pain: 5/10 Present, 0/10 Best, 8/10 Worst: Aggravating factors: left cervical rotation and lateral flexion  Easing factors: tylenol, stretches Recent neck trauma: No Prior history of neck injury or pain: Yes Pain quality: pain quality: sharp Radiating pain: Yes  Numbness/Tingling: No Follow-up appointment with MD: Yes - oncologist  Dominant hand: right Imaging: Yes    OBJECTIVE  SENSATION: Grossly intact to light touch bilateral UE as determined by testing dermatomes C2-T2   MUSCULOSKELETAL: Tremor: None Bulk: Normal Tone: Normal  Posture Upper crossed syndrome with forward head and mild thoracic kyphosis.  Palpation No significant tenderness with palpation. Trigger point in left UT > right UT.  Strength R/L 5/4- Shoulder flexion  5/2 Shoulder abduction  5/3 Shoulder external rotation  5/4 Shoulder internal rotation  5/4 Shoulder extension  5/4+ Shoulder horizontal abduction 5/4 Elbow flexion  5/4 Elbow extension   Cervical isometrics are strong in all directions;  AROM R/L 40 Cervical Flexion 35* Cervical Extension 40/20* Cervical Lateral Flexion 55/52* Cervical Rotation *Indicates pain, overpressure performed unless otherwise indicated   Repeated Movements No centralization or peripheralization of symptoms with repeated cervical protraction and retraction.  Retraction and protraction: painful    Passive Accessory Intervertebral Motion (PAIVM) Reproduction of neck pain at levels C4-T2 CPA and C6-C7 left UPA. Generally hypomobile throughout.   SPECIAL TESTS Sp[place]ings A (ipsilateral lateral flexion/axial compression): R: Negative L: Positive Distraction Test: Positive  Hoffman Sign (cervical cord compression): R: Negative L: Negative ULTT Median: R: Negative L:  Positive     ASSESSMENT Clinical Impression: Pt is a pleasant 66 year-old male referred for neck pain with radiating pain into LUE with LUE weakness. PT examination reveals deficits in left UE strength, cervial ROM and posture. Would like to assess periscapular strength next visit. He tested positive with all 4 tests within cluster indicating cervical radiculopathy, consistent with imaging. He had mild pain with spinal mobilizations; hypomobile throughout. Pain with multiplanar cervical ROM; unable to obtain full AROM on left GH joint due to acute muscular weakness. Pt will benefit from skilled PT services to address deficits in strength, ROM and pain to return to pain-free function at home and return to hobbies for improved QOL.   PLAN Next Visit: distraction, cervical and upper thoracic mobs, stretches, therex, periscapular strength.     MedBridge Access Code: [email or web address]  Exercises Seated Upper Trapezius Stretch - 1 x daily - 7 x weekly - 2-3 reps Seated Cervical Rotation Stretch - 1 x daily - 7 x weekly - 2-3 reps - 30 hold Median Nerve Flossing - 1 x daily -  7 x weekly - 10 reps Shoulder External Rotation and Scapular Retraction with Resistance - 1 x daily - 7 x weekly - 3 sets - 10 reps - yellow band weight    Next session: Manual Therapy - CPA, left UPA, NAGs (Natural Apophyseal Glides) / SNAGs (Sustained Natural Apophyseal Glides) Exercise Therapy - AROM, stretching and strengthening Postural re-education      Objective measurements completed on examination: See above findings.          PT Education - 12/17/21 1746     Education Details HEP    Person(s) Educated Patient    Methods Explanation;Handout    Comprehension Verbalized understanding              PT Short Term Goals - 12/17/21 1759       PT SHORT TERM GOAL #1   Title Pt will demonstrate independence with HEP to improve cervical function for increased ability to participate with ADLs    Baseline  3/6: HEP given    Time 4    Period Weeks    Status New    Target Date 01/14/22               PT Long Term Goals - 12/17/21 1800       PT LONG TERM GOAL #1   Title Patient will increase FOTO score to equal to or greater than 64 to demonstrate statistically significant improvement in mobility and quality of life.    Baseline 3/6: 48    Time 8    Period Weeks    Status New    Target Date 02/11/22      PT LONG TERM GOAL #2   Title Pt will decrease worst cervical pain as reported on NPRS by at least 2 points in order to demonstrate clinically significant reduction in neck pain.    Baseline 3/6: 8/10    Time 8    Period Weeks    Status New    Target Date 02/11/22      PT LONG TERM GOAL #3   Title Pt will improve active cervical lateral flexion >40* and extension >60* for improved muscle length ad completion of functional tasks at home.    Baseline 3/6: R/L 40* / 20*, E 35*    Time 8    Period Weeks    Status New    Target Date 02/11/22      PT LONG TERM GOAL #4   Title Pt will increase strength of by at least 1/2 MMT grade throughout entire LUE in order to demonstrate improvement in strength and function.    Baseline 3/6: between 2 to 4/5 - please see eval    Time 8    Period Weeks    Status New    Target Date 02/11/22                    Plan - 12/17/21 1749     Clinical Impression Statement Pt is a pleasant 66 year-old male referred for neck pain with radiating pain into LUE with LUE weakness. PT examination reveals deficits in left UE strength, cervial ROM and posture. Would like to assess periscapular strength next visit. He tested positive with all 4 tests within cluster indicating cervical radiculopathy, consistent with imaging. He had mild pain with spinal mobilizations; hypomobile throughout. Pain with multiplanar cervical ROM; unable to obtain full AROM on left GH joint due to acute muscular weakness. Pt will benefit from skilled PT services to  address  deficits in strength, ROM and pain to return to pain-free function at home and return to hobbies for improved QOL.    Personal Factors and Comorbidities Age;Time since onset of injury/illness/exacerbation;Comorbidity 2    Comorbidities Multiple myeloma, chemotherapy, HTN, anemia    Examination-Activity Limitations Lift;Reach Overhead;Carry    Examination-Participation Restrictions Driving;Cleaning;Community Activity    Stability/Clinical Decision Making Evolving/Moderate complexity    Clinical Decision Making Moderate    Rehab Potential Good    PT Frequency 2x / week    PT Duration 8 weeks    PT Treatment/Interventions ADLs/Self Care Home Management;Cryotherapy;Traction;Therapeutic exercise;Therapeutic activities;Patient/family education;Manual techniques;Passive range of motion;Dry needling;Spinal Manipulations;Joint Manipulations;Neuromuscular re-education;Electrical Stimulation;Aquatic Therapy;DME Instruction;Functional mobility training;Balance training;Taping;Energy conservation    PT Next Visit Plan distraction, cervical and upper thoracic mobs, stretches, UE therex, periscapular strength    Consulted and Agree with Plan of Care Patient             Patient will benefit from skilled therapeutic intervention in order to improve the following deficits and impairments:  Hypomobility, Pain, Postural dysfunction, Decreased range of motion, Decreased activity tolerance, Decreased strength, Decreased mobility  Visit Diagnosis: Neck pain  Radiculopathy, cervical region  Muscle left arm weakness     Problem List Patient Active Problem List   Diagnosis Date Noted   Medicare welcome exam 07/26/2021   Chemotherapy induced neutropenia ([place]) 07/19/2021   Bone lesion 06/05/2021   Chronic midline low back pain without sciatica 06/05/2021   Neck pain, chronic 08/11/2020   Lumbar degenerative disc disease 08/10/2020   Lumbar foraminal stenosis 08/09/2020   Thrombocytopenia ([place])  07/20/2020   Encounter for antineoplastic chemotherapy 07/20/2020   Stage 3a chronic kidney disease ([place]) 07/20/2020   Productive cough 03/08/2020   Carotid arterial disease ([place]) 11/26/2019   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[place])    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   Leg pain 03/03/2019   AKI (acute kidney injury) ([place]) 01/06/2019   Multiple myeloma ([place]) 08/26/2018   Goals of care, counseling/discussion 08/26/2018   Fatty liver 07/29/2018   Anemia 07/06/2018   Creatinine elevation 07/06/2018   Headache 07/02/2018   Snoring 06/10/2018   FH: colon cancer 06/10/2018   Advance care planning 11/24/2015   Routine general medical examination at a health care facility 04/29/2012   ESSENTIAL HYPERTENSION, BENIGN 01/20/2008   HLD (hyperlipidemia) 06/02/2007   ERECTILE DYSFUNCTION, 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, [place], [address] Phone: [phone]   Fax:  [phone]  Name: Luis Burke MRN: [id number] Date of Birth: [birth year]

## 2021-12-18 ENCOUNTER — Other Ambulatory Visit: Payer: Self-pay | Admitting: Pharmacist

## 2021-12-18 DIAGNOSIS — C9000 Multiple myeloma not having achieved remission: Secondary | ICD-10-CM

## 2021-12-18 MED ORDER — POMALIDOMIDE 2 MG PO CAPS: 2.0000 mg | ORAL_CAPSULE | Freq: Every day | ORAL | 0 refills | Status: DC

## 2021-12-19 ENCOUNTER — Ambulatory Visit: Payer: Medicare Other | Admitting: Physical Therapy

## 2021-12-19 ENCOUNTER — Other Ambulatory Visit: Payer: Self-pay

## 2021-12-19 DIAGNOSIS — M6281 Muscle weakness (generalized): Secondary | ICD-10-CM

## 2021-12-19 DIAGNOSIS — M5412 Radiculopathy, cervical region: Secondary | ICD-10-CM

## 2021-12-19 DIAGNOSIS — M542 Cervicalgia: Secondary | ICD-10-CM

## 2021-12-19 NOTE — Addendum Note (Signed)
Addended by: Darl Pikes on: 12/19/2021 09:32 AM ? ? Modules accepted: Orders ? ?

## 2021-12-19 NOTE — Therapy (Signed)
[facility] [address] [place], [address] Phone: [phone]   Fax:  [phone]  Physical Therapy Treatment  Patient Details  Name: Luis Burke MRN: [id number] Date of Birth: 1956/05/06 No data recorded  Encounter Date: 12/19/2021   PT End of Session - 12/20/21 1703     Visit Number 2    Number of Visits 17    Date for PT Re-Evaluation 02/11/22    Progress Note Due on Visit 10    PT Start Time 0918    PT Stop Time 1003    PT Time Calculation (min) 45 min    Activity Tolerance Patient tolerated treatment well    Behavior During Therapy [facility] for tasks assessed/performed             Past Medical History:  Diagnosis Date   AKI (acute kidney injury) ([place]) 01/06/2019   Anemia    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   Fatty liver    on u/s 07/2018   Hyperlipidemia 03/2004   Hypertension 1994   Multiple myeloma ([place])    Snores     Past Surgical History:  Procedure Laterality Date   CATARACT EXTRACTION Left 10/10/2021   CATARACT EXTRACTION W/PHACO Right 03/04/2018   Procedure: CATARACT EXTRACTION PHACO AND INTRAOCULAR LENS PLACEMENT ([place])  RIGHT TORIC;  Surgeon: Leandrew Koyanagi, MD;  Location: [place];  Service: Ophthalmology;  Laterality: Right;  [place] no Toric Lens 1:45 5.3   CATARACT EXTRACTION W/PHACO Left 10/10/2021   Procedure: CATARACT EXTRACTION PHACO AND INTRAOCULAR LENS PLACEMENT ([place]) LEFT;  Surgeon: Leandrew Koyanagi, MD;  Location: [place];  Service: Ophthalmology;  Laterality: Left;  5.16 1:03.8   COLONOSCOPY WITH PROPOFOL N/A 07/20/2018   Procedure: COLONOSCOPY WITH PROPOFOL;  Surgeon: Jonathon Bellows, MD;  Location: [facility] ENDOSCOPY;  Service: Gastroenterology;  Laterality: N/A;   ESOPHAGOGASTRODUODENOSCOPY (EGD) WITH PROPOFOL N/A 07/20/2018   Procedure: ESOPHAGOGASTRODUODENOSCOPY (EGD) WITH  PROPOFOL;  Surgeon: Jonathon Bellows, MD;  Location: [facility] ENDOSCOPY;  Service: Gastroenterology;  Laterality: N/A;   EYE SURGERY Right    HERNIA REPAIR     L Lap herniorraphy  04/2000   Left wrist ganglionectomy      There were no vitals filed for this visit.   Subjective Assessment - 12/19/21 0923     Subjective Pt is feel a little more mobility in his left arm over the past couple days. Unsure if due to chemo pill wearing off or taking gabapentin. 4/10 pain in left shoulder and neck.    Pertinent History Pt arrives to PT with radiating pain from cervical spine into left upper extremity. Pt is familiar to this clinic for similar symptoms. He states radiating pain typically extends into the upper trap. Recently on one occasion, pain radiated full length of arm; pt went to ER due to significant pain and concern of stroke. Stroke ruled out, pt referred to PT for new significant weakness within [place] joint and is unable to lift his arm above his head in frontal and sagittal planes. At worst, pain is 8/10, best 0/10. 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 Patient is receiving maintenance chemo for multiple myeloma 1x/month with next treatment scheduled for this Thursday. Patient states that pain improved significantly following last bout of PT. He states he continued his HEP for a little while but stopped and that is when the  pain returned. Pt has a past medical history of bone marrow transplant; he is followed by [facility] oncology Dr. Tasia Catchings. Other PMH includes HTN, hyperlipidemia, lumbar stenosis, and anemia. Pt denies any unexplained weight fluctuation, saddle paresthesia, loss of bowel/bladder function, or unrelenting night pain at this time.    Limitations Lifting;House hold activities    How long can you sit comfortably? hours    How long can you stand comfortably? hours    How long can you walk comfortably? 30    Diagnostic tests MRI    Patient Stated Goals take some of the soreness out  of left neck/shoulder    Currently in Pain? Yes    Pain Score 4     Pain Location Neck    Pain Orientation Left    Pain Descriptors / Indicators Aching    Pain Type Chronic pain    Pain Onset More than a month ago    Pain Frequency Constant    Aggravating Factors  left cervical rotation and lateral flexion    Pain Relieving Factors tylenol, stretches    Effect of Pain on Daily Activities retired; limits ALDs               INTERVENTIONS  Therex Arm ergometer warm-up, level 4 for 5 minutes.  Prone cervical retraction, 2x8; Prone press up to elbows with cervical rotation, 2x8 each direction; Supine cervical protraction with chin tuck w/ 5 second hold, x8 reps; W standing at wall, VC for as many points of contact to the wall as possible, 2x8 reps;  Standing row GTB, 2x10 reps;    Manual Therapy STM using effleurage, ptrissage and trigger point release to L upper trap, cervical extensors, levator scap; Sub Occipital release 1 x 60 sec;  Cervical Distraction 3 bouts x 30 sec; Supine stretching to L UT and levator scap.    HEP was created and performed; handout provided. Exercises include therex listed above.   Next visit:  -cervical and thoracic CPA, UPA and SNAG   Clinical Impression: Pt is pleasant and motivated throughout session. Manual techniques utilized to alleviate pain and improve ROM in cervical spine. Would like to include spine mobs (CPA/UPA/SNAG) next session. Pt had difficulty with postural training due to skeletal changes however demonstrates correct muscle activation during exercises. HEP was created, pt performed within session and handout provided. Pt will benefit from skilled PT services to address deficits in strength, ROM and pain to return to pain-free function at home and return to hobbies for improved QOL.          PT Short Term Goals - 12/17/21 1759       PT SHORT TERM GOAL #1   Title Pt will demonstrate independence with HEP to improve  cervical function for increased ability to participate with ADLs    Baseline 3/6: HEP given    Time 4    Period Weeks    Status New    Target Date 01/14/22               PT Long Term Goals - 12/17/21 1800       PT LONG TERM GOAL #1   Title Patient will increase FOTO score to equal to or greater than 64 to demonstrate statistically significant improvement in mobility and quality of life.    Baseline 3/6: 48    Time 8    Period Weeks    Status New    Target Date 02/11/22      PT LONG  TERM GOAL #2   Title Pt will decrease worst cervical pain as reported on NPRS by at least 2 points in order to demonstrate clinically significant reduction in neck pain.    Baseline 3/6: 8/10    Time 8    Period Weeks    Status New    Target Date 02/11/22      PT LONG TERM GOAL #3   Title Pt will improve active cervical lateral flexion >40* and extension >60* for improved muscle length ad completion of functional tasks at home.    Baseline 3/6: R/L 40* / 20*, E 35*    Time 8    Period Weeks    Status New    Target Date 02/11/22      PT LONG TERM GOAL #4   Title Pt will increase strength of by at least 1/2 MMT grade throughout entire LUE in order to demonstrate improvement in strength and function.    Baseline 3/6: between 2 to 4/5 - please see eval    Time 8    Period Weeks    Status New    Target Date 02/11/22                   Plan - 12/20/21 1713     Clinical Impression Statement Pt is pleasant and motivated throughout session. Manual techniques utilized to alleviate pain and improve ROM in cervical spine. Would like to include spine mobs (CPA/UPA/SNAG) next session. Pt had difficulty with postural training due to skeletal changes however demonstrates correct muscle activation during exercises. HEP was created, pt performed within session and handout provided. Pt will benefit from skilled PT services to address deficits in strength, ROM and pain to return to pain-free  function at home and return to hobbies for improved QOL.    Personal Factors and Comorbidities Age;Time since onset of injury/illness/exacerbation;Comorbidity 2    Comorbidities Multiple myeloma, chemotherapy, HTN, anemia    Examination-Activity Limitations Lift;Reach Overhead;Carry    Examination-Participation Restrictions Driving;Cleaning;Community Activity    Stability/Clinical Decision Making Evolving/Moderate complexity    Rehab Potential Good    PT Frequency 2x / week    PT Duration 8 weeks    PT Treatment/Interventions ADLs/Self Care Home Management;Cryotherapy;Traction;Therapeutic exercise;Therapeutic activities;Patient/family education;Manual techniques;Passive range of motion;Dry needling;Spinal Manipulations;Joint Manipulations;Neuromuscular re-education;Electrical Stimulation;Aquatic Therapy;DME Instruction;Functional mobility training;Balance training;Taping;Energy conservation    PT Next Visit Plan distraction, cervical and upper thoracic mobs, stretches, UE therex, periscapular strength    Consulted and Agree with Plan of Care Patient             Patient will benefit from skilled therapeutic intervention in order to improve the following deficits and impairments:  Hypomobility, Pain, Postural dysfunction, Decreased range of motion, Decreased activity tolerance, Decreased strength, Decreased mobility  Visit Diagnosis: Neck pain  Radiculopathy, cervical region  Muscle left arm weakness     Problem List Patient Active Problem List   Diagnosis Date Noted   Medicare welcome exam 07/26/2021   Chemotherapy induced neutropenia ([place]) 07/19/2021   Bone lesion 06/05/2021   Chronic midline low back pain without sciatica 06/05/2021   Neck pain, chronic 08/11/2020   Lumbar degenerative disc disease 08/10/2020   Lumbar foraminal stenosis 08/09/2020   Thrombocytopenia ([place]) 07/20/2020   Encounter for antineoplastic chemotherapy 07/20/2020   Stage 3a chronic kidney disease  ([place]) 07/20/2020   Productive cough 03/08/2020   Carotid arterial disease ([place]) 11/26/2019   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[place])    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[place])  04/23/2019   Leg pain 03/03/2019   AKI (acute kidney injury) ([place]) 01/06/2019   Multiple myeloma ([place]) 08/26/2018   Goals of care, counseling/discussion 08/26/2018   Fatty liver 07/29/2018   Anemia 07/06/2018   Creatinine elevation 07/06/2018   Headache 07/02/2018   Snoring 06/10/2018   FH: colon cancer 06/10/2018   Advance care planning 11/24/2015   Routine general medical examination at a health care facility 04/29/2012   ESSENTIAL HYPERTENSION, BENIGN 01/20/2008   HLD (hyperlipidemia) 06/02/2007   ERECTILE DYSFUNCTION, 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 [place], [address] Phone: [phone]   Fax:  [phone]  Name: Luis Burke MRN: [id number] Date of Birth: [birth year]

## 2021-12-20 ENCOUNTER — Encounter: Payer: Self-pay | Admitting: Oncology

## 2021-12-20 ENCOUNTER — Inpatient Hospital Stay: Payer: Medicare Other | Attending: Oncology

## 2021-12-20 ENCOUNTER — Inpatient Hospital Stay: Payer: Medicare Other

## 2021-12-20 ENCOUNTER — Encounter: Payer: Self-pay | Admitting: Physical Therapy

## 2021-12-20 ENCOUNTER — Inpatient Hospital Stay (HOSPITAL_BASED_OUTPATIENT_CLINIC_OR_DEPARTMENT_OTHER): Payer: Medicare Other | Admitting: Oncology

## 2021-12-20 VITALS — BP 137/90 | HR 73 | Temp 97.6°F

## 2021-12-20 VITALS — BP 144/92 | HR 70 | Temp 97.3°F | Wt 207.2 lb

## 2021-12-20 DIAGNOSIS — Z5111 Encounter for antineoplastic chemotherapy: Secondary | ICD-10-CM | POA: Diagnosis not present

## 2021-12-20 DIAGNOSIS — N1831 Chronic kidney disease, stage 3a: Secondary | ICD-10-CM | POA: Insufficient documentation

## 2021-12-20 DIAGNOSIS — M542 Cervicalgia: Secondary | ICD-10-CM | POA: Diagnosis not present

## 2021-12-20 DIAGNOSIS — C9001 Multiple myeloma in remission: Secondary | ICD-10-CM | POA: Diagnosis not present

## 2021-12-20 DIAGNOSIS — D6959 Other secondary thrombocytopenia: Secondary | ICD-10-CM | POA: Insufficient documentation

## 2021-12-20 DIAGNOSIS — C9000 Multiple myeloma not having achieved remission: Secondary | ICD-10-CM

## 2021-12-20 DIAGNOSIS — Z5112 Encounter for antineoplastic immunotherapy: Secondary | ICD-10-CM | POA: Insufficient documentation

## 2021-12-20 DIAGNOSIS — D696 Thrombocytopenia, unspecified: Secondary | ICD-10-CM

## 2021-12-20 DIAGNOSIS — I129 Hypertensive chronic kidney disease with stage 1 through stage 4 chronic kidney disease, or unspecified chronic kidney disease: Secondary | ICD-10-CM | POA: Diagnosis not present

## 2021-12-20 DIAGNOSIS — C9002 Multiple myeloma in relapse: Secondary | ICD-10-CM

## 2021-12-20 DIAGNOSIS — N179 Acute kidney failure, unspecified: Secondary | ICD-10-CM

## 2021-12-20 LAB — CBC WITH DIFFERENTIAL/PLATELET
Abs Immature Granulocytes: 0.02 10*3/uL (ref 0.00–0.07)
Basophils Absolute: 0 10*3/uL (ref 0.0–0.1)
Basophils Relative: 1 %
Eosinophils Absolute: 0.1 10*3/uL (ref 0.0–0.5)
Eosinophils Relative: 1 %
HCT: 35.3 % — ABNORMAL LOW (ref 39.0–52.0)
Hemoglobin: 11.9 g/dL — ABNORMAL LOW (ref 13.0–17.0)
Immature Granulocytes: 1 %
Lymphocytes Relative: 39 %
Lymphs Abs: 1.4 10*3/uL (ref 0.7–4.0)
MCH: 34 pg (ref 26.0–34.0)
MCHC: 33.7 g/dL (ref 30.0–36.0)
MCV: 100.9 fL — ABNORMAL HIGH (ref 80.0–100.0)
Monocytes Absolute: 0.6 10*3/uL (ref 0.1–1.0)
Monocytes Relative: 16 %
Neutro Abs: 1.5 10*3/uL — ABNORMAL LOW (ref 1.7–7.7)
Neutrophils Relative %: 42 %
Platelets: 108 10*3/uL — ABNORMAL LOW (ref 150–400)
RBC: 3.5 MIL/uL — ABNORMAL LOW (ref 4.22–5.81)
RDW: 14.7 % (ref 11.5–15.5)
WBC: 3.7 10*3/uL — ABNORMAL LOW (ref 4.0–10.5)
nRBC: 0 % (ref 0.0–0.2)

## 2021-12-20 LAB — COMPREHENSIVE METABOLIC PANEL WITH GFR
ALT: 18 U/L (ref 0–44)
AST: 18 U/L (ref 15–41)
Albumin: 4 g/dL (ref 3.5–5.0)
Alkaline Phosphatase: 59 U/L (ref 38–126)
Anion gap: 10 (ref 5–15)
BUN: 18 mg/dL (ref 8–23)
CO2: 22 mmol/L (ref 22–32)
Calcium: 9.1 mg/dL (ref 8.9–10.3)
Chloride: 103 mmol/L (ref 98–111)
Creatinine, Ser: 1.26 mg/dL — ABNORMAL HIGH (ref 0.61–1.24)
GFR, Estimated: >60 mL/min
Glucose, Bld: 117 mg/dL — ABNORMAL HIGH (ref 70–99)
Potassium: 3.9 mmol/L (ref 3.5–5.1)
Sodium: 135 mmol/L (ref 135–145)
Total Bilirubin: 0.7 mg/dL (ref 0.3–1.2)
Total Protein: 6.5 g/dL (ref 6.5–8.1)

## 2021-12-20 MED ORDER — DENOSUMAB 120 MG/1.7ML ~~LOC~~ SOLN
120.0000 mg | Freq: Once | SUBCUTANEOUS | Status: AC
  Administered 2021-12-20: 10:00:00 120 mg via SUBCUTANEOUS
  Filled 2021-12-20: qty 1.7

## 2021-12-20 MED ORDER — DARATUMUMAB-HYALURONIDASE-FIHJ 1800-30000 MG-UT/15ML ~~LOC~~ SOLN
1800.0000 mg | Freq: Once | SUBCUTANEOUS | Status: AC
  Administered 2021-12-20: 11:00:00 1800 mg via SUBCUTANEOUS
  Filled 2021-12-20: qty 15

## 2021-12-20 MED ORDER — DIPHENHYDRAMINE HCL 25 MG PO CAPS
50.0000 mg | ORAL_CAPSULE | Freq: Once | ORAL | Status: AC
  Administered 2021-12-20: 10:00:00 50 mg via ORAL
  Filled 2021-12-20: qty 2

## 2021-12-20 MED ORDER — DEXAMETHASONE 4 MG PO TABS
20.0000 mg | ORAL_TABLET | Freq: Once | ORAL | Status: AC
  Administered 2021-12-20: 10:00:00 20 mg via ORAL
  Filled 2021-12-20: qty 5

## 2021-12-20 MED ORDER — ACETAMINOPHEN 325 MG PO TABS
650.0000 mg | ORAL_TABLET | Freq: Once | ORAL | Status: AC
  Administered 2021-12-20: 10:00:00 650 mg via ORAL
  Filled 2021-12-20: qty 2

## 2021-12-20 NOTE — Patient Instructions (Signed)
[facility] AT [place]  Discharge Instructions: ?Thank you for choosing [facility] to provide your oncology and hematology care.  ?If you have a lab appointment with the [facility], please go directly to the [facility] and check in at the registration area. ? ?Wear comfortable clothing and clothing appropriate for easy access to any Portacath or PICC line.  ? ?We strive to give you quality time with your provider. You may need to reschedule your appointment if you arrive late (15 or more minutes).  Arriving late affects you and other patients whose appointments are after yours.  Also, if you miss three or more appointments without notifying the office, you may be dismissed from the clinic at the provider?s discretion.    ?  ?For prescription refill requests, have your pharmacy contact our office and allow 72 hours for refills to be completed.   ? ?Today you received the following chemotherapy and/or immunotherapy agents Darzalex    ?  ?To help prevent nausea and vomiting after your treatment, we encourage you to take your nausea medication as directed. ? ?BELOW ARE SYMPTOMS THAT SHOULD BE REPORTED IMMEDIATELY: ?*FEVER GREATER THAN 100.4 F (38 ?C) OR HIGHER ?*CHILLS OR SWEATING ?*NAUSEA AND VOMITING THAT IS NOT CONTROLLED WITH YOUR NAUSEA MEDICATION ?*UNUSUAL SHORTNESS OF BREATH ?*UNUSUAL BRUISING OR BLEEDING ?*URINARY PROBLEMS (pain or burning when urinating, or frequent urination) ?*BOWEL PROBLEMS (unusual diarrhea, constipation, pain near the anus) ?TENDERNESS IN MOUTH AND THROAT WITH OR WITHOUT PRESENCE OF ULCERS (sore throat, sores in mouth, or a toothache) ?UNUSUAL RASH, SWELLING OR PAIN  ?UNUSUAL VAGINAL DISCHARGE OR ITCHING  ? ?Items with * indicate a potential emergency and should be followed up as soon as possible or go to the Emergency Department if any problems should occur. ? ?Please show the CHEMOTHERAPY ALERT CARD or IMMUNOTHERAPY ALERT CARD at check-in to  the Emergency Department and triage nurse. ? ?Should you have questions after your visit or need to cancel or reschedule your appointment, please contact [facility] [place] AT [place]  [phone] and follow the prompts.  Office hours are 8:00 a.m. to 4:30 p.m. Monday - Friday. Please note that voicemails left after 4:00 p.m. may not be returned until the following business day.  We are closed weekends and major holidays. You have access to a nurse at all times for urgent questions. Please call the main number to the clinic [phone] and follow the prompts. ? ?For any non-urgent questions, you may also contact your provider using MyChart. We now offer e-Visits for anyone 63 and older to request care online for non-urgent symptoms. For details visit [email or web address]. ?  ?Also download the MyChart app! Go to the app store, search "MyChart", open the app, select [facility], and log in with your MyChart username and password. ? ?Due to Covid, a mask is required upon entering the hospital/clinic. If you do not have a mask, one will be given to you upon arrival. For doctor visits, patients may have 1 support person aged 8 or older with them. For treatment visits, patients cannot have anyone with them due to current Covid guidelines and our immunocompromised population.  ?

## 2021-12-20 NOTE — Progress Notes (Signed)
Per Benjamine Mola RN per Dr. Tasia Catchings okay to proceed with Darzalex Huel Cote and Delton See at this time.  ? ?

## 2021-12-20 NOTE — Progress Notes (Signed)
Patient here follow up. Patient complains of neck and shoulder pain.  ?

## 2021-12-20 NOTE — Progress Notes (Signed)
Hematology/Oncology Progress note [phone] [phone]      Patient Care Team: Tonia Ghent, MD as PCP - General (Family Medicine) Earlie Server, MD as Consulting Physician (Oncology) Beather Arbour Lilyan Punt, MD as Referring Physician (Hematology and Oncology) Diannia Ruder, MD as Referring Physician (Hematology and Oncology)  REASON FOR VISIT Follow up for multiple myeloma.   HISTORY OF PRESENTING ILLNESS:  Luis Burke is a  66 y.o.  male with PMH listed below presents for follow up of multiple myeloma.  # 07/27/2018 multiple myeloma panel showed M protein of 0.1, IgG 662, IgA 49, IgM 7. Patient was called back to further labs done. 08/10/2018, free light chain ratio showed extremely high level of kappa free light chain 10,183, with a kappa lambda light chain ratio of 1414.31 LDH 164 Beta-2 microglobulin 5 Patient was called and discuss about results.  He was recommended to undergo bone marrow biopsy and PET scan. 08/19/2018 bone marrow biopsy showed hypercellular marrow 80%, involved by plasma cell neoplasm up to 95%.  Consistent with plasma cell myeloma. Myeloma FISH  gain of gain of CEP12  09/10/2018 skeletal survey showed questionable small lucent lesions within the midshaft of the humerus bilaterally.  Otherwise no suspicious focal lytic lesion or acute bone abnormality. 08/25/2018 PET scan showed no definite hypermetabolic bone disease but CT findings are highly suspicious for numerous small myelomatous lesions involving the spine, sternum and scattered ribs.  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 He received preparative regimen with melphalan 200 mg/m on 04/22/2019 followed by autologous stem cell infusion on 04/23/2019. Currently he is transfusion independent.  Transfusion criteria with as needed hemoglobin less than 7.5 for platelet less than 10,000.  Transplant course was complicated with febrile neutropenia grade 3,  treated with vancomycin and cephapirin until engraftment on 05/06/2019. Patient also had engraftment syndrome and had to be started on Solu-Medrol 25 mg twice daily on 05/05/2019.  Transition to Medrol Dosepak on 05/07/2019 to complete steroid taper as outpatient.  Patient is currently on acyclovir for 1 year after transplant, dose was reduced on 722 11/20/2018 due to renal function. Patient had a baseline CKD with creatinine running between 1.4-1.6.  He had acute on chronic kidney injury and creatinine went up to 2.2 on 722.  Fluid hydration was given and creatinine decreased to baseline 1.5-1.7. His Hickman catheter was removed.  Candida Cruris treated with topical clotrimazole 1% 3 times daily and to resolution..  #  seen by Dr. Tonia Brooms BMT team on 06/30/2019.revaccination at [facility] begins at 12 months post transplant I discussed with [facility] hematology Dr.Choi and he recommends plans as listed below.  -patient to be started on Ixazomib maintenance: -Ixazomib 31m on D1/D8/D15 out of 28 day cycle.  -patient will start re-vaccination 12 months post transplant.  -Post transplant bone marrow biopsy if clinically indicated.  # seen by [facility] bone marrow transplant team in June 2021.  Patient has been started on immunization protocol.  He reports no new complaints.  #07/12/2020 CT skeletal survey done at [facility].  Lucent lesion in the L4 spinous process measuring 4 mm which is  nonspecific. Consider confirmation of marrow replacing process with MRI.  2.  Incompletely characterized renal cysts   # He continues to have chronic back pain and neck pain.  [facility] 08[facility], MRI lumbar spine with and without contrast showed no evidence of spinal metastasis.  Lumbar spondylosis is most pronounced at L5-S1 where  there is severe right and moderate left neural foraminal stenosis. 08/11/2020, x-ray cervical spine complete with flexion and extension showed The cervical spine is visualized from C1 to the  top of T1 on the lateral  view.  Reversal of the normal cervical lordosis with a focal kyphosis centered at the C5-C6 intervertebral level. Trace anterolisthesis of C4 onto C5. Trace retrolisthesis of C6 on C7. No evidence of dynamic instability is noted on  the flexion or extension views.   No prevertebral soft tissue swelling. Cervical vertebral body heights are maintained. Multilevel degenerative changes of the cervical spine including discogenic disease, most pronounced at C5-C6 and C6-C7, and facet arthropathy most pronounced at C4-C5. Multilevel mild to moderate left neural foraminal osseous narrowing of the mid to lower cervical vertebrae, possibly centrally/artifactual by patient positioning.  #07/28/2020 bone marrow normocellular marrow with polytypic plasmacytosis, 3 to 8%   Patient is in remission.  Cytogenetics negative for trisomy 12.  Negative myeloma FISH panel.   # Lumbar lesion,  11/13/20 [facility]  MRI cervical sine w/wo contrast - no evidence of metastatic disease C3-4 severe spinal stenosis, left neural foraminal narrowing. C4-5 spinal canal stenosis. [facility] MRI results showed no metastatic spine lesion. Chronic back pain and neck pain, images are consistent with chronic degenerative disease.  Patient continues to follow-up with [place] orthopedic surgeon.  04/03/2021, light chain ratio increased to 3.76.   05/15/2021 bone marrow biopsy showed slightly hypercellular bone marrow for age with slight plasmacytosis.  5% plasma cells with kappa light chain restriction.  Cytogenetics normal.  Myeloma FISH negative # 05/15/2021 bone marrow biopsy showed slightly hypercellular bone marrow for age with slight plasmacytosis.  5% plasma cells with kappa light chain restriction.  Cytogenetics normal.  Myeloma FISH negative  # 05/31/2021 Daratumumab -Pomalyst-dexamethasone-D1 patient had grade 2 infusion reaction to daratumumab and received steroids, Benadryl, famotidine.  He was able to finish treatment.D8  was able to finish Daratumumab with no infusion reactions.  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  Patient follows with podiatry Dr. Amalia Hailey for stress reaction fracture of the second metatarsal right.  He was cleared by Dr. Amalia Hailey to go back to [place].    08/16/21 Skeletal survey showed stable small right humeral and scapular lucencies.  Ill-defined small lucencies in the proximal right humeral shaft more conspicuous than on prior study of 08/10/2018. 09/21/2021, PET scan showed no evidence of suspicious or new hypermetabolism, scattered small lucent foci seen previously in the sternum and the spine are less prominent today and have resolved in some regions.  Aortic atherosclerosis.  Patient went to the emergency room on 11/18/2021 for neck pain evaluation.  11/18/2021, MRI cervical spine with and without contrast showed no multiple myeloma lesion or marrow edema identified in the cervical spine.  Abnormal dural thickening and a/or abnormal epidural space at C4/C5 levels.  This is nonspecific.  ER physician discussed the case with neurosurgery Dr. Cari Caraway who feels that this is more likely degenerative disc disease.  Recommend steroids.  Patient was given a tapering course of prednisone.  INTERVAL HISTORY Luis Burke is a 66 y.o. male who has above history reviewed by me today for follow-up multiple myeloma.  11/20/2021, status post bone marrow biopsy which showed 1% of plasma cells. The plasma cells generally display polyclonal staining pattern for kappa and lambda light chains although a few very small clusters appear kappa light chain restricted.  The findings are very limited but most suggestive of minimal residual plasma cell neoplasm especially in the  presence of monoclonal protein with kappa light chain specificity. Normal cytogenetics and myeloma FISH  He followed with Dr.Yarbrough we will further discuss his MRI scan with the neuroradiologist.  There is a possible need for biopsy if there is a  concern about the abnormal enhancement of the cervical spine around spinal cord.  Review of Systems  Constitutional:  Negative for appetite change, chills, fatigue, fever and unexpected weight change.  HENT:   Negative for hearing loss.   Eyes:  Negative for eye problems and icterus.  Respiratory:  Negative for chest tightness, cough and shortness of breath.   Cardiovascular:  Negative for chest pain and leg swelling.  Gastrointestinal:  Negative for abdominal distention and abdominal pain.  Endocrine: Negative for hot flashes.  Genitourinary:  Negative for difficulty urinating, dysuria and frequency.   Musculoskeletal:  Positive for back pain and neck pain. Negative for arthralgias.  Skin:  Negative for itching and rash.  Neurological:  Negative for light-headedness and numbness.  Hematological:  Negative for adenopathy. Does not bruise/bleed easily.  Psychiatric/Behavioral:  Negative for confusion.    MEDICAL HISTORY:  Past Medical History:  Diagnosis Date   AKI (acute kidney injury) ([place]) 01/06/2019   Anemia    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[place])    autologous stem cell bone marrow transplant on 04/23/2019.   Fatty liver    on u/s 07/2018   Hyperlipidemia 03/2004   Hypertension 1994   Multiple myeloma ([place])    Snores     SURGICAL HISTORY: Past Surgical History:  Procedure Laterality Date   CATARACT EXTRACTION Left 10/10/2021   CATARACT EXTRACTION W/PHACO Right 03/04/2018   Procedure: CATARACT EXTRACTION PHACO AND INTRAOCULAR LENS PLACEMENT ([place])  RIGHT TORIC;  Surgeon: Leandrew Koyanagi, MD;  Location: [facility];  Service: Ophthalmology;  Laterality: Right;  [place] no Toric Lens 1:45 5.3   CATARACT EXTRACTION W/PHACO Left 10/10/2021   Procedure: CATARACT EXTRACTION PHACO AND INTRAOCULAR LENS PLACEMENT ([place]) LEFT;  Surgeon: Leandrew Koyanagi, MD;  Location: [facility];  Service: Ophthalmology;  Laterality: Left;  5.16 1:03.8   COLONOSCOPY WITH  PROPOFOL N/A 07/20/2018   Procedure: COLONOSCOPY WITH PROPOFOL;  Surgeon: Jonathon Bellows, MD;  Location: [facility] ENDOSCOPY;  Service: Gastroenterology;  Laterality: N/A;   ESOPHAGOGASTRODUODENOSCOPY (EGD) WITH PROPOFOL N/A 07/20/2018   Procedure: ESOPHAGOGASTRODUODENOSCOPY (EGD) WITH PROPOFOL;  Surgeon: Jonathon Bellows, MD;  Location[facility] ENDOSCOPY;  Service: Gastroenterology;  Laterality: N/A;   EYE SURGERY Right    HERNIA REPAIR     L Lap herniorraphy  04/2000   Left wrist ganglionectomy      SOCIAL HISTORY: Social History   Socioeconomic History   Marital status: Married    Spouse name: Not on file   Number of children: 1   Years of education: Not on file   Highest education level: Not on file  Occupational History   Occupation: [employer]  Tobacco Use   Smoking status: Never   Smokeless tobacco: Never   Tobacco comments:    Occassionally  Vaping Use   Vaping Use: Never used  Substance and Sexual Activity   Alcohol use: Not Currently    Alcohol/week: 3.0 standard drinks    Types: 3 Cans of beer per week   Drug use: No   Sexual activity: Not on file  Other Topics Concern   Not on file  Social History Narrative   Divorced 2009, married 06-26-21.     His daughter died 4 days after giving birth to the patient's granddaughter  Has joint custody of his deceased daughter's child   Worked at [employer] is [place], retired 2020.     Social Determinants of Health   Financial Resource Strain: Not on file  Food Insecurity: Not on file  Transportation Needs: Not on file  Physical Activity: Not on file  Stress: Not on file  Social Connections: Not on file  Intimate Partner Violence: Not on file    FAMILY HISTORY: Family History  Problem Relation Age of Onset   Hypertension Mother    Diabetes Mother    Heart disease Mother        CAD   Dementia Mother    Prostate cancer Father    Hypertension Father    Heart disease Father        MI 02/03   Colon cancer Father     Hypertension Sister    Hypertension Sister    Hypertension Sister     ALLERGIES:  is allergic to bactrim [sulfamethoxazole-trimethoprim], clonidine derivatives, and tadalafil.  MEDICATIONS:  Current Outpatient Medications  Medication Sig Dispense Refill   acetaminophen (TYLENOL) 325 MG tablet Take 2 tablets (650 mg total) by mouth See admin instructions. 1 hour prior to chemotherapy treatments 30 tablet 0   amLODipine (NORVASC) 5 MG tablet TAKE 2 TABLETS BY MOUTH EVERY DAY 180 tablet 1   ASPIRIN 81 PO Take 81 mg by mouth daily.     B Complex Vitamins (VITAMIN B COMPLEX PO) Take 1 Dose by mouth daily.     chlorhexidine (PERIDEX) 0.12 % solution Use as directed 15 mLs in the mouth or throat 2 (two) times daily. 473 mL 1   co-enzyme Q-10 50 MG capsule Take 50 mg by mouth daily.     [employer] VITAMIN B12 1000 MCG tablet TAKE 1 TABLET BY MOUTH EVERY DAY 90 tablet 3   Denosumab (XGEVA Pontoosuc) Inject into the skin every 30 (thirty) days. Last rcvd 09/27/21     dexamethasone (DECADRON) 4 MG tablet Take 5 tablets (20 mg total) by mouth once a week. Take at least 1 hour prior to infusion appt. Take with food. To start on 06/07/21. 60 tablet 0   diphenhydrAMINE (BENADRYL) 50 MG tablet Take 1 tablet (50 mg total) by mouth See admin instructions. Take 1 tablet 1 hour prior to chemotherapy treatments. 30 tablet 0   hydrALAZINE (APRESOLINE) 10 MG tablet TAKE 1 TABLET(10 MG) BY MOUTH THREE TIMES DAILY 270 tablet 1   lisinopril (ZESTRIL) 20 MG tablet TAKE 1 TABLET BY MOUTH EVERY DAY 90 tablet 2   loperamide (IMODIUM) 2 MG capsule Take 1 capsule (2 mg total) by mouth See admin instructions. Take 2 capsules at the onset of diarrhea, then 1 capsule every 2 hours or after every loose bowel movement. Maximum 8 capsules per 24 hours. 60 capsule 0   LORazepam (ATIVAN) 1 MG tablet TAKE 1 TABLET (1 MG TOTAL) BY MOUTH EVERY 6 (SIX) HOURS AS NEEDED FOR ANXIETY (FOR NAUSEA)     magnesium chloride (SLOW-MAG) 64 MG TBEC SR tablet  Take 1 tablet (64 mg total) by mouth daily. 60 tablet 0   metoprolol succinate (TOPROL-XL) 50 MG 24 hr tablet TAKE 4 TABLETS BY MOUTH EVERY DAY, TAKE WITH OR IMMEDIATELY FOLLOWING A MEAL 360 tablet 2   montelukast (SINGULAIR) 10 MG tablet Take 1 tablet (10 mg total) by mouth See admin instructions. Take 1day prior to chemotherapy 30 tablet 1   ondansetron (ZOFRAN) 8 MG tablet Take 1 tablet (8 mg total) by mouth  every 8 (eight) hours as needed. for nausea 30 tablet 1   pravastatin (PRAVACHOL) 10 MG tablet TAKE 1 TABLET BY MOUTH EVERY DAY 90 tablet 3   prochlorperazine (COMPAZINE) 10 MG tablet      sildenafil (REVATIO) 20 MG tablet Take 1-5 tablets (20-100 mg total) by mouth daily as needed. 50 tablet 12   zinc gluconate 50 MG tablet Take 50 mg by mouth daily.     No current facility-administered medications for this visit.   Facility-Administered Medications Ordered in Other Visits  Medication Dose Route Frequency Provider Last Rate Last Admin   0.9 %  sodium chloride infusion   Intravenous Once Earlie Server, MD         PHYSICAL EXAMINATION: ECOG PERFORMANCE STATUS: 1 - Symptomatic but completely ambulatory Vitals:   12/20/21 0835  BP: (!) 144/92  Pulse: 70  Temp: (!) 97.3 F (36.3 C)   Filed Weights   12/20/21 0835  Weight: 207 lb 3.2 oz (94 kg)    Physical Exam Constitutional:      General: He is not in acute distress. HENT:     Head: Normocephalic and atraumatic.  Eyes:     General: No scleral icterus. Cardiovascular:     Rate and Rhythm: Normal rate and regular rhythm.     Heart sounds: Normal heart sounds.  Pulmonary:     Effort: Pulmonary effort is normal. No respiratory distress.     Breath sounds: No wheezing.  Abdominal:     General: Bowel sounds are normal. There is no distension.     Palpations: Abdomen is soft.  Musculoskeletal:        General: No deformity. Normal range of motion.     Cervical back: Normal range of motion and neck supple.  Skin:    General:  Skin is warm and dry.     Findings: No erythema or rash.  Neurological:     Mental Status: He is alert and oriented to person, place, and time. Mental status is at baseline.     Cranial Nerves: No cranial nerve deficit.     Coordination: Coordination normal.  Psychiatric:        Mood and Affect: Mood normal.     LABORATORY DATA:  I have reviewed the data as listed Lab Results  Component Value Date   WBC 3.7 (L) 12/20/2021   HGB 11.9 (L) 12/20/2021   HCT 35.3 (L) 12/20/2021   MCV 100.9 (H) 12/20/2021   PLT 108 (L) 12/20/2021   Recent Labs    11/08/21 0758 11/18/21 0606 11/22/21 0817 12/20/21 0817  NA 136 137 138 135  K 3.9 4.0 4.0 3.9  CL 103 108 99 103  CO2 25 23 25 22   GLUCOSE 117* 105* 107* 117*  BUN 22 24* 25* 18  CREATININE 1.32* 1.31* 1.36* 1.26*  CALCIUM 9.4 9.0 9.9 9.1  GFRNONAA 60* >60 58* >60  PROT 7.2  --  6.9 6.5  ALBUMIN 4.5  --  4.1 4.0  AST 15  --  19 18  ALT 17  --  39 18  ALKPHOS 58  --  65 59  BILITOT 1.2  --  0.6 0.7    Iron/TIBC/Ferritin/ %Sat    Component Value Date/Time   IRON 74 07/01/2018 0944   TIBC 250 07/01/2018 0944   FERRITIN 357 07/01/2018 0944   IRONPCTSAT 30 07/01/2018 0944    Lab Results  Component Value Date   TOTALPROTELP 6.4 11/08/2021   ALBUMINELP 4.0 03/08/2021  A1GS 0.2 03/08/2021   A2GS 0.7 03/08/2021   BETS 1.1 03/08/2021   GAMS 1.1 03/08/2021   MSPIKE Not Observed 03/08/2021   SPEI Comment 03/08/2021   Lab Results  Component Value Date   KPAFRELGTCHN 20.7 (H) 11/08/2021   LAMBDASER 15.6 11/08/2021   KAPLAMBRATIO 1.33 11/08/2021    RADIOGRAPHIC STUDIES: I have personally reviewed the radiological images as listed and agreed with the findings in the report. MR Cervical Spine W or Wo Contrast  Result Date: 11/18/2021 CLINICAL DATA:  66 year old male with multiple myeloma. Neck pain. EXAM: MRI CERVICAL SPINE WITHOUT AND WITH CONTRAST TECHNIQUE: Multiplanar and multiecho pulse sequences of the cervical  spine, to include the craniocervical junction and cervicothoracic junction, were obtained without and with intravenous contrast. CONTRAST:  58m GADAVIST GADOBUTROL 1 MMOL/ML IV SOLN COMPARISON:  PET-CT 09/20/2021. Cervical spine CT 09/27/2009. FINDINGS: Alignment: Chronic straightening of cervical lordosis has not significantly changed since 2010, including trace retrolisthesis of C5 on C6. Vertebrae: Visible bone marrow signal is within normal limits. No acute or suspicious osseous lesion identified. Pronounced degenerative osseous changes at the left C4-C5 facet (see below), C5-C6 endplates. Cord: No spinal cord signal abnormality despite some degenerative cord mass effect. No abnormal intradural enhancement. However, there is circumferential abnormal dural thickening and enhancement at the C4 and C5 levels (series 12, image 14), and eccentric to the left and posteriorly at C4 (series 12, image 10). On noncontrast images the dura and epidural space here is T2 hyperintense and T1 hypointense. And there appears to be asymmetric epidural enhancement also at the left C4 and C5 neural foramina. See additional details below. Posterior Fossa, vertebral arteries, paraspinal tissues: Cervicomedullary junction is within normal limits. Negative visible posterior fossa. Preserved major vascular flow voids in the neck. Patchy STIR hyperintensity in the interspinous ligament at C3-C4, but no associated enhancement and other visible neck soft tissues appear negative. Negative visible left lung apex. Disc levels: C2-C3: Foraminal disc bulging and endplate spurring greater on the left. No significant stenosis. C3-C4: Moderate to severe right side facet hypertrophy. Mild ligament flavum hypertrophy. Rightward disc bulging and endplate spurring. Mild spinal stenosis. Up to mild cord mass effect. Moderate to severe right C4 foraminal stenosis. C4-C5: Mild-to-moderate right but moderate to severe left facet hypertrophy. Degenerative  appearing left facet joint fluid. Mild disc bulging and endplate spurring. Mild spinal stenosis when combined with the abnormal dural thickening and/or epidural space here. Mild spinal cord mass effect. Moderate left and mild right C5 foraminal stenosis. C5-C6: Abnormal dural/epidural space continues to this level. Mild retrolisthesis with disc space loss and circumferential disc osteophyte complex. Negative facets. Mild spinal stenosis and spinal cord mass effect. Moderate to severe bilateral C6 foraminal stenosis. C6-C7: Disc space loss with circumferential disc osteophyte complex. No spinal stenosis. Moderate bilateral C7 foraminal stenosis. C7-T1: Mild anterolisthesis. Moderate facet hypertrophy greater on the right. Mild endplate spurring. No spinal or significant foraminal stenosis. Negative visible upper thoracic levels. IMPRESSION: 1. No multiple myeloma lesion or marrow edema identified in the cervical spine. But there is abnormal dural thickening and/or abnormal epidural space at the C4 and C5 levels. But this is nonspecific. Trace epidural hematoma is possible. Malignant dural thickening is difficult to exclude. Degenerative/inflammatory dural thickening is felt less likely, and there is no strong evidence of acute spinal infection. Inflammation appears to involve the exiting left C4 and C5 nerve roots. 2. Underlying cervical spine degeneration, including chronic moderate to severe facet arthropathy at C3-C4 and C4-C5. Degenerative appearing  facet joint effusion on the left at the latter. Multilevel mild degenerative spinal stenosis and spinal cord mass effect C3-C4 through C5-C6. No spinal cord signal abnormality identified. Moderate and occasionally severe severe degenerative neural foraminal stenosis at the right C4, left C5, bilateral C6 and C7 nerve levels. Electronically Signed   By: Genevie Ann M.D.   On: 11/18/2021 06:59   DG Shoulder Left  Result Date: 11/18/2021 CLINICAL DATA:  Acute on chronic  left shoulder pain. EXAM: LEFT SHOULDER - 2+ VIEW COMPARISON:  None. FINDINGS: No evidence for an acute fracture. No subluxation or dislocation. Mild degenerative changes noted AC joint. No suspicious lytic or sclerotic osseous abnormality. IMPRESSION: AC joint degeneration.  No acute bony abnormality. Electronically Signed   By: Misty Stanley M.D.   On: 11/18/2021 07:12   DG Foot Complete Right  Result Date: 10/30/2021 Please see detailed radiograph report in office note.  CT BONE MARROW BIOPSY & ASPIRATION  Result Date: 11/20/2021 INDICATION: Myeloma workup EXAM: CT GUIDED RIGHT ILIAC BONE MARROW ASPIRATION AND CORE BIOPSY Date:  11/20/2021 11/20/2021 9:51 am Radiologist:  M. Daryll Brod, MD Guidance:  CT FLUOROSCOPY: Fluoroscopy Time: None. MEDICATIONS: 1% lidocaine local ANESTHESIA/SEDATION: 2.0 mg IV Versed; 100 mcg IV Fentanyl Moderate Sedation Time:  10 minute The patient was continuously monitored during the procedure by the interventional radiology nurse under my direct supervision. CONTRAST:  None. COMPLICATIONS: None PROCEDURE: Informed consent was obtained from the patient following explanation of the procedure, risks, benefits and alternatives. The patient understands, agrees and consents for the procedure. All questions were addressed. A time out was performed. The patient was positioned prone and non-contrast localization CT was performed of the pelvis to demonstrate the iliac marrow spaces. Maximal barrier sterile technique utilized including caps, mask, sterile gowns, sterile gloves, large sterile drape, hand hygiene, and Betadine prep. Under sterile conditions and local anesthesia, an 11 gauge coaxial bone biopsy needle was advanced into the right iliac marrow space. Needle position was confirmed with CT imaging. Initially, bone marrow aspiration was performed. Next, the 11 gauge outer cannula was utilized to obtain a right iliac bone marrow core biopsy. Needle was removed. Hemostasis was  obtained with compression. The patient tolerated the procedure well. Samples were prepared with the cytotechnologist. No immediate complications. IMPRESSION: CT guided right iliac bone marrow aspiration and core biopsy. Electronically Signed   By: Jerilynn Mages.  Shick M.D.   On: 11/20/2021 10:33      ASSESSMENT & PLAN:  1. Encounter for antineoplastic chemotherapy   2. Multiple myeloma in remission ([facility])   3. Stage 3a chronic kidney disease ([place])   4. Neck pain   5. Thrombocytopenia ([place])    #Light chain multiple myeloma beta 2 microglobulin 5 and normal albumin.  Stage II.cytogenetics showed normal male chromosome, MDS FISH panel showed trisomy 60- Standard Risk.S/p RVD x 9 and  Autologous bone marrow stem cell transplant at [facility] on 04/23/2019.' Early relapse multiple myeloma.  06/04/2021 5% plasma cells -August 2022-February 2023, patient completed 7 cycles of daratumumab/Pomalyst/dexamethasone. 11/20/2021, status post bone marrow biopsy which showed 1% of plasma cells.  Likely he has minimal residual disease. I discussed with patient's [facility] oncologist Dr. Maylene Roes.  We have agreed upon de-escalating his regimen to Daratumumab monthly maintenance for now. Discontinue pomalyst.  patient agrees with the plan.  Labs reviewed and discussed with patient.  Proceed with daratumumab today.  Dexamethasone 20 mg monthly.  # Neck pain, MRI showed abnormal enhancement of spinal cord. Discussed with neurosurgery Dr.Yarbrough.  no need for biopsy for now.  Flexeril 13m daily PRN continue physical therapy.   #Thrombocytopenia, chemotherapy-induced.  Monitor. #Lucent lesions in the bone. Majority of his lucencies were not hypermetabolic on previous PET scan  #Chronic thrombocytopenia, platelet count has increased.  Likely reactive. # CKD, avoid nephrotoxin.  Encourage hydration.  #Post bone marrow transplant care He has completed 1 year of acyclovir.  Off acyclovir now. [facility]    #Bone health/osteopenia/multiple myeloma-  Continue Xgeva monthly Continue calcium and vitamin D supplementation. Follow up 4 weeks for Lab MD Daratumumab/ Dexamethasone. treatment.   ZEarlie Server MD, PhD 12/20/2021

## 2021-12-21 ENCOUNTER — Encounter: Payer: Self-pay | Admitting: Oncology

## 2021-12-21 LAB — KAPPA/LAMBDA LIGHT CHAINS
Kappa free light chain: 12.3 mg/L (ref 3.3–19.4)
Kappa, lambda light chain ratio: 1.45 (ref 0.26–1.65)
Lambda free light chains: 8.5 mg/L (ref 5.7–26.3)

## 2021-12-21 NOTE — Addendum Note (Signed)
Addended by: Earlie Server on: 12/21/2021 09:09 PM ? ? Modules accepted: Orders ? ?

## 2021-12-24 ENCOUNTER — Ambulatory Visit: Payer: Medicare Other | Admitting: Physical Therapy

## 2021-12-24 ENCOUNTER — Other Ambulatory Visit: Payer: Self-pay

## 2021-12-24 ENCOUNTER — Encounter: Payer: Self-pay | Admitting: Physical Therapy

## 2021-12-24 DIAGNOSIS — M5412 Radiculopathy, cervical region: Secondary | ICD-10-CM

## 2021-12-24 DIAGNOSIS — M6281 Muscle weakness (generalized): Secondary | ICD-10-CM

## 2021-12-24 DIAGNOSIS — M542 Cervicalgia: Secondary | ICD-10-CM | POA: Diagnosis not present

## 2021-12-24 LAB — MULTIPLE MYELOMA PANEL, SERUM
Albumin SerPl Elph-Mcnc: 3.9 g/dL (ref 2.9–4.4)
Albumin/Glob SerPl: 1.7 (ref 0.7–1.7)
Alpha 1: 0.2 g/dL (ref 0.0–0.4)
Alpha2 Glob SerPl Elph-Mcnc: 0.7 g/dL (ref 0.4–1.0)
B-Globulin SerPl Elph-Mcnc: 1 g/dL (ref 0.7–1.3)
Gamma Glob SerPl Elph-Mcnc: 0.3 g/dL — ABNORMAL LOW (ref 0.4–1.8)
Globulin, Total: 2.3 g/dL (ref 2.2–3.9)
IgA: 125 mg/dL (ref 61–437)
IgG (Immunoglobin G), Serum: 439 mg/dL — ABNORMAL LOW (ref 603–1613)
IgM (Immunoglobulin M), Srm: 9 mg/dL — ABNORMAL LOW (ref 20–172)
M Protein SerPl Elph-Mcnc: 0.1 g/dL — ABNORMAL HIGH
Total Protein ELP: 6.2 g/dL (ref 6.0–8.5)

## 2021-12-24 NOTE — Therapy (Signed)
[facility] [address], [place], [address] Phone: [phone]   Fax:  [phone]  Physical Therapy Treatment  Patient Details  Name: Luis Burke MRN: [id number] Date of Birth: [birth year] No data recorded  Encounter Date: 12/24/2021   PT End of Session - 12/24/21 1248     Visit Number 3    Number of Visits 17    Date for PT Re-Evaluation 02/11/22    Progress Note Due on Visit 10    PT Start Time 0915    PT Stop Time 1000    PT Time Calculation (min) 45 min    Activity Tolerance Patient tolerated treatment well    Behavior During Therapy [facility] for tasks assessed/performed             Past Medical History:  Diagnosis Date   AKI (acute kidney injury) ([place]) 01/06/2019   Anemia    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[place])    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   Fatty liver    on u/s 07/2018   Hyperlipidemia 03/2004   Hypertension 1994   Multiple myeloma ([place])    Snores     Past Surgical History:  Procedure Laterality Date   CATARACT EXTRACTION Left 10/10/2021   CATARACT EXTRACTION W/PHACO Right 03/04/2018   Procedure: CATARACT EXTRACTION PHACO AND INTRAOCULAR LENS PLACEMENT ([place])  RIGHT TORIC;  Surgeon: Leandrew Koyanagi, MD;  Location: [place];  Service: Ophthalmology;  Laterality: Right;  [place] no Toric Lens 1:45 5.3   CATARACT EXTRACTION W/PHACO Left 10/10/2021   Procedure: CATARACT EXTRACTION PHACO AND INTRAOCULAR LENS PLACEMENT ([place]) LEFT;  Surgeon: Leandrew Koyanagi, MD;  Location: [place];  Service: Ophthalmology;  Laterality: Left;  5.16 1:03.8   COLONOSCOPY WITH PROPOFOL N/A 07/20/2018   Procedure: COLONOSCOPY WITH PROPOFOL;  Surgeon: Jonathon Bellows, MD;  Location: [facility] ENDOSCOPY;  Service: Gastroenterology;  Laterality: N/A;   ESOPHAGOGASTRODUODENOSCOPY (EGD) WITH PROPOFOL N/A 07/20/2018   Procedure: ESOPHAGOGASTRODUODENOSCOPY (EGD)  WITH PROPOFOL;  Surgeon: Jonathon Bellows, MD;  Location[facility] ENDOSCOPY;  Service: Gastroenterology;  Laterality: N/A;   EYE SURGERY Right    HERNIA REPAIR     L Lap herniorraphy  04/2000   Left wrist ganglionectomy      There were no vitals filed for this visit.   Subjective Assessment - 12/24/21 0916     Subjective Sharp pain into right side of the head last night that interrupted pain. He saw oncologist last week - states he will wean down to 1x/month on chemo. States he was sore after last PT session. 4/10 pain in neck, 3/10 in left shoulder. States he has an appointment for imaging of foot due to sudden pain over the weekend reminicent of previous foot fx.    Pertinent History Pt arrives to PT with radiating pain from cervical spine into left upper extremity. Pt is familiar to this clinic for similar symptoms. He states radiating pain typically extends into the upper trap. Recently on one occasion, pain radiated full length of arm; pt went to ER due to significant pain and concern of stroke. Stroke ruled out, pt referred to PT for new significant weakness within [place] joint and is unable to lift his arm above his head in frontal and sagittal planes. At worst, pain is 8/10, best 0/10. 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 Patient is receiving maintenance chemo for multiple myeloma 1x/month with next treatment scheduled  for this Thursday. Patient states that pain improved significantly following last bout of PT. He states he continued his HEP for a little while but stopped and that is when the pain returned. Pt has a past medical history of bone marrow transplant; he is followed by [facility] oncology Dr. Tasia Catchings. Other PMH includes HTN, hyperlipidemia, lumbar stenosis, and anemia. Pt denies any unexplained weight fluctuation, saddle paresthesia, loss of bowel/bladder function, or unrelenting night pain at this time.    Limitations Lifting;House hold activities    How long can you sit  comfortably? hours    How long can you stand comfortably? hours    How long can you walk comfortably? 30    Diagnostic tests MRI    Patient Stated Goals take some of the soreness out of left neck/shoulder    Currently in Pain? Yes    Pain Score 4     Pain Location Neck    Pain Orientation Left    Pain Descriptors / Indicators Aching    Pain Type Chronic pain    Pain Onset More than a month ago              INTERVENTIONS   Therex Arm ergometer warm-up, level 4 for 5 minutes (2.5 forward, 2.5 backward).  Seated W, VC for as many points of contact to the wall as possible, 2x8 reps;  Seated row GTB, 2x10 reps; GH flexion and abduction through available range, no weight, x10 each   Manual Therapy STM using effleurage, ptrissage and trigger point release to L upper trap; CPA and bilateral UPA to levels C2-T4, grade 2-3, 2x30 seconds each  SNAG C3-6 x6 each level Sub Occipital release 1 x 60 sec;  Cervical Distraction 3 bouts x 30 sec; Seated stretching to L UT, levator scap, SCM.      Added SNAG to HEP.     Next visit:  -cervical protraction/retraction - AAROM GH    Clinical Impression: Pt is pleasant and motivated throughout session. Manual techniques continued with addition of CPA, UPA and SNAG to alleviate pain and improve ROM in cervical spine. Significant tenderness in left upper trap. He had difficulty with multiplanar [place] strengthening with tendency to compensate with UT activation. Plan to utilize cane for [facility] for improved technique. Pt will benefit from skilled PT services to address deficits in strength, ROM and pain to return to pain-free function at home and return to hobbies for improved QOL.           PT Short Term Goals - 12/17/21 1759       PT SHORT TERM GOAL #1   Title Pt will demonstrate independence with HEP to improve cervical function for increased ability to participate with ADLs    Baseline 3/6: HEP given    Time 4    Period Weeks     Status New    Target Date 01/14/22               PT Long Term Goals - 12/17/21 1800       PT LONG TERM GOAL #1   Title Patient will increase FOTO score to equal to or greater than 64 to demonstrate statistically significant improvement in mobility and quality of life.    Baseline 3/6: 48    Time 8    Period Weeks    Status New    Target Date 02/11/22      PT LONG TERM GOAL #2   Title Pt will decrease worst cervical pain  as reported on NPRS by at least 2 points in order to demonstrate clinically significant reduction in neck pain.    Baseline 3/6: 8/10    Time 8    Period Weeks    Status New    Target Date 02/11/22      PT LONG TERM GOAL #3   Title Pt will improve active cervical lateral flexion >40* and extension >60* for improved muscle length ad completion of functional tasks at home.    Baseline 3/6: R/L 40* / 20*, E 35*    Time 8    Period Weeks    Status New    Target Date 02/11/22      PT LONG TERM GOAL #4   Title Pt will increase strength of by at least 1/2 MMT grade throughout entire LUE in order to demonstrate improvement in strength and function.    Baseline 3/6: between 2 to 4/5 - please see eval    Time 8    Period Weeks    Status New    Target Date 02/11/22                   Plan - 12/24/21 1303     Clinical Impression Statement Pt is pleasant and motivated throughout session. Manual techniques continued with addition of CPA, UPA and SNAG to alleviate pain and improve ROM in cervical spine. Significant tenderness in left upper trap. He had difficulty with multiplanar [place] strengthening with tendency to compensate with UT activation. Plan to utilize cane for [facility] for improved technique. Pt will benefit from skilled PT services to address deficits in strength, ROM and pain to return to pain-free function at home and return to hobbies for improved QOL.    Personal Factors and Comorbidities Age;Time since onset of  injury/illness/exacerbation;Comorbidity 2    Comorbidities Multiple myeloma, chemotherapy, HTN, anemia    Examination-Activity Limitations Lift;Reach Overhead;Carry    Examination-Participation Restrictions Driving;Cleaning;Community Activity    Stability/Clinical Decision Making Evolving/Moderate complexity    Rehab Potential Good    PT Frequency 2x / week    PT Duration 8 weeks    PT Treatment/Interventions ADLs/Self Care Home Management;Cryotherapy;Traction;Therapeutic exercise;Therapeutic activities;Patient/family education;Manual techniques;Passive range of motion;Dry needling;Spinal Manipulations;Joint Manipulations;Neuromuscular re-education;Electrical Stimulation;Aquatic Therapy;DME Instruction;Functional mobility training;Balance training;Taping;Energy conservation    PT Next Visit Plan distraction, cervical and upper thoracic mobs, stretches, UE therex, periscapular strength    Consulted and Agree with Plan of Care Patient             Patient will benefit from skilled therapeutic intervention in order to improve the following deficits and impairments:  Hypomobility, Pain, Postural dysfunction, Decreased range of motion, Decreased activity tolerance, Decreased strength, Decreased mobility  Visit Diagnosis: Neck pain  Radiculopathy, cervical region  Muscle left arm weakness     Problem List Patient Active Problem List   Diagnosis Date Noted   Medicare welcome exam 07/26/2021   Chemotherapy induced neutropenia ([place]) 07/19/2021   Bone lesion 06/05/2021   Chronic midline low back pain without sciatica 06/05/2021   Neck pain, chronic 08/11/2020   Lumbar degenerative disc disease 08/10/2020   Lumbar foraminal stenosis 08/09/2020   Thrombocytopenia ([place]) 07/20/2020   Encounter for antineoplastic chemotherapy 07/20/2020   Stage 3a chronic kidney disease ([place]) 07/20/2020   Productive cough 03/08/2020   Carotid arterial disease ([place]) 11/26/2019   Bone marrow transplant  status ([place])    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   Leg pain 03/03/2019   AKI (acute kidney injury) (  [place]) 01/06/2019   Multiple myeloma ([place]) 08/26/2018   Goals of care, counseling/discussion 08/26/2018   Fatty liver 07/29/2018   Anemia 07/06/2018   Creatinine elevation 07/06/2018   Headache 07/02/2018   Snoring 06/10/2018   FH: colon cancer 06/10/2018   Advance care planning 11/24/2015   Routine general medical examination at a health care facility 04/29/2012   ESSENTIAL HYPERTENSION, BENIGN 01/20/2008   HLD (hyperlipidemia) 06/02/2007   ERECTILE DYSFUNCTION, 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 [place], [address] Phone: [phone]   Fax:  [phone]  Name: Luis Burke MRN: [id number] Date of Birth: 03-01-1956

## 2021-12-26 ENCOUNTER — Encounter: Payer: Self-pay | Admitting: Physical Therapy

## 2021-12-26 ENCOUNTER — Ambulatory Visit: Payer: Medicare Other | Admitting: Physical Therapy

## 2021-12-26 ENCOUNTER — Other Ambulatory Visit: Payer: Self-pay

## 2021-12-26 DIAGNOSIS — M542 Cervicalgia: Secondary | ICD-10-CM

## 2021-12-26 DIAGNOSIS — M5412 Radiculopathy, cervical region: Secondary | ICD-10-CM

## 2021-12-26 DIAGNOSIS — M6281 Muscle weakness (generalized): Secondary | ICD-10-CM

## 2021-12-26 NOTE — Therapy (Signed)
[place] [facility] ?[address] ?[place], [place], [address] ?Phone: [phone]   Fax:  [phone] ? ?Physical Therapy Treatment ? ?Patient Details  ?Name: Luis Burke ?MRN: [id number] ?Date of Birth: Feb 03, 1956 ?No data recorded ? ?Encounter Date: 12/26/2021 ? ? PT End of Session - 12/26/21 0921   ? ? Visit Number 4   ? Number of Visits 17   ? Date for PT Re-Evaluation 02/11/22   ? Progress Note Due on Visit 10   ? PT Start Time [phone]   ? PT Stop Time 1000   ? PT Time Calculation (min) 44 min   ? Activity Tolerance Patient tolerated treatment well   ? Behavior During Therapy [facility] for tasks assessed/performed   ? ?  ?  ? ?  ? ? ?Past Medical History:  ?Diagnosis Date  ? AKI (acute kidney injury) ([place]) 01/06/2019  ? Anemia   ? 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[place])   ? 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  ? Fatty liver   ? on u/s 07/2018  ? Hyperlipidemia 03/2004  ? Hypertension 1994  ? Multiple myeloma ([place])   ? Snores   ? ? ?Past Surgical History:  ?Procedure Laterality Date  ? CATARACT EXTRACTION Left 10/10/2021  ? CATARACT EXTRACTION W/PHACO Right 03/04/2018  ? Procedure: CATARACT EXTRACTION PHACO AND INTRAOCULAR LENS PLACEMENT ([place])  RIGHT TORIC;  Surgeon: Leandrew Koyanagi, MD;  Location: [place];  Service: Ophthalmology;  Laterality: Right;  [place] no Toric Lens 1:45 5.3  ? CATARACT EXTRACTION W/PHACO Left 10/10/2021  ? Procedure: CATARACT EXTRACTION PHACO AND INTRAOCULAR LENS PLACEMENT ([place]) LEFT;  Surgeon: Leandrew Koyanagi, MD;  Location: [place];  Service: Ophthalmology;  Laterality: Left;  5.16 ?1:03.8  ? COLONOSCOPY WITH PROPOFOL N/A 07/20/2018  ? Procedure: COLONOSCOPY WITH PROPOFOL;  Surgeon: Jonathon Bellows, MD;  Location[facility] ENDOSCOPY;  Service: Gastroenterology;  Laterality: N/A;  ? ESOPHAGOGASTRODUODENOSCOPY (EGD) WITH PROPOFOL N/A 07/20/2018  ? Procedure: ESOPHAGOGASTRODUODENOSCOPY (EGD)  WITH PROPOFOL;  Surgeon: Jonathon Bellows, MD;  Location: [facility] ENDOSCOPY;  Service: Gastroenterology;  Laterality: N/A;  ? EYE SURGERY Right   ? HERNIA REPAIR    ? L Lap herniorraphy  04/2000  ? Left wrist ganglionectomy    ? ? ?There were no vitals filed for this visit. ? ? Subjective Assessment - 12/26/21 0918   ? ? Subjective Pt continues to have interupted sleep. 3/10 pain in neck. States he has an appointment tomorrow for imaging of foot due to sudden pain over the weekend reminicent of previous foot fx.   ? Pertinent History Pt arrives to PT with radiating pain from cervical spine into left upper extremity. Pt is familiar to this clinic for similar symptoms. He states radiating pain typically extends into the upper trap. Recently on one occasion, pain radiated full length of arm; pt went to ER due to significant pain and concern of stroke. Stroke ruled out, pt referred to PT for new significant weakness within [place] joint and is unable to lift his arm above his head in frontal and sagittal planes. At worst, pain is 8/10, best 0/10. 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 Patient is receiving maintenance chemo for multiple myeloma 1x/month with next treatment scheduled for this Thursday. Patient states that pain improved significantly following last bout of PT. He states he continued his HEP for a little while but stopped and that is when the pain returned.  Pt has a past medical history of bone marrow transplant; he is followed by [facility] oncology Dr. Tasia Catchings. Other PMH includes HTN, hyperlipidemia, lumbar stenosis, and anemia. Pt denies any unexplained weight fluctuation, saddle paresthesia, loss of bowel/bladder function, or unrelenting night pain at this time.   ? Limitations Lifting;House hold activities   ? How long can you sit comfortably? hours   ? How long can you stand comfortably? hours   ? How long can you walk comfortably? 30   ? Diagnostic tests MRI   ? Patient Stated Goals take some of the  soreness out of left neck/shoulder   ? Currently in Pain? Yes   ? Pain Score 3    ? Pain Location Neck   ? Pain Orientation Left   ? Pain Descriptors / Indicators Aching   ? Pain Type Chronic pain   ? Pain Onset More than a month ago   ? ?  ?  ? ?  ? ? ? ? ?INTERVENTIONS ?  ?Therex ?Arm ergometer warm-up, level 4 for 5 minutes (2.5 forward, 2.5 backward).  ?Seated thoracic extension x10 reps; ?Seated row GTB, 2x10 reps; ?Seated AAROM GH flexion and abduction using cane, x10 each ?  ?Manual Therapy ?STM using effleurage, p?trissage and trigger point release to L upper trap, levator scap; ?CPA and bilateral UPA to levels C7-T4, grade 2-3, 2x30 seconds each  ?Sub Occipital release 1 x 60 sec;  ?Cervical Distraction 6 bouts x 10 sec; ?Seated stretching to L UT, levator scap, SCM.  ?  ? ?Added AAROM using cane to HEP. ?  ?  ?Next visit:  ? ?  ?  ?Clinical Impression: Pt is pleasant and motivated throughout session. Increased tenderness with trigger points noted in left upper back and traps. He reported decreased pain following manual therapy. He performed AAROM exercises well and through full range; PT encouraged LUE to participate as much as possible with significant muscular fatigue noted with tremor, especially during flexion. PT progressed HEP to include AAROM GH exercises. Pt will benefit from skilled PT services to address deficits in strength, ROM and pain to return to pain-free function at home and return to hobbies for improved QOL. ? ? ? ? ? ? ? ? ? PT Short Term Goals - 12/17/21 1759   ? ?  ? PT SHORT TERM GOAL #1  ? Title Pt will demonstrate independence with HEP to improve cervical function for increased ability to participate with ADLs   ? Baseline 3/6: HEP given   ? Time 4   ? Period Weeks   ? Status New   ? Target Date 01/14/22   ? ?  ?  ? ?  ? ? ? ? PT Long Term Goals - 12/17/21 1800   ? ?  ? PT LONG TERM GOAL #1  ? Title Patient will increase FOTO score to equal to or greater than 64 to demonstrate  statistically significant improvement in mobility and quality of life.   ? Baseline 3/6: 48   ? Time 8   ? Period Weeks   ? Status New   ? Target Date 02/11/22   ?  ? PT LONG TERM GOAL #2  ? Title Pt will decrease worst cervical pain as reported on NPRS by at least 2 points in order to demonstrate clinically significant reduction in neck pain.   ? Baseline 3/6: 8/10   ? Time 8   ? Period Weeks   ? Status New   ? Target  Date 02/11/22   ?  ? PT LONG TERM GOAL #3  ? Title Pt will improve active cervical lateral flexion >40* and extension >60* for improved muscle length ad completion of functional tasks at home.   ? Baseline 3/6: R/L 40* / 20*, E 35*   ? Time 8   ? Period Weeks   ? Status New   ? Target Date 02/11/22   ?  ? PT LONG TERM GOAL #4  ? Title Pt will increase strength of by at least 1/2 MMT grade throughout entire LUE in order to demonstrate improvement in strength and function.   ? Baseline 3/6: between 2 to 4/5 - please see eval   ? Time 8   ? Period Weeks   ? Status New   ? Target Date 02/11/22   ? ?  ?  ? ?  ? ? ? ? ? ? ? ? Plan - 12/26/21 1226   ? ? Clinical Impression Statement Pt is pleasant and motivated throughout session. Increased tenderness with trigger points noted in left upper back and traps. He reported decreased pain following manual therapy. He performed AAROM exercises well and through full range; PT encouraged LUE to participate as much as possible with significant muscular fatigue noted with tremor, especially during flexion. PT progressed HEP to include AAROM GH exercises. Pt will benefit from skilled PT services to address deficits in strength, ROM and pain to return to pain-free function at home and return to hobbies for improved QOL.   ? Personal Factors and Comorbidities Age;Time since onset of injury/illness/exacerbation;Comorbidity 2   ? Comorbidities Multiple myeloma, chemotherapy, HTN, anemia   ? Examination-Activity Limitations Lift;Reach Overhead;Carry   ?  Examination-Participation Restrictions Driving;Cleaning;Community Activity   ? Stability/Clinical Decision Making Evolving/Moderate complexity   ? Rehab Potential Good   ? PT Frequency 2x / week   ? PT Duration 8 weeks   ? PT Tre

## 2021-12-27 ENCOUNTER — Encounter: Payer: Self-pay | Admitting: Podiatry

## 2021-12-27 ENCOUNTER — Ambulatory Visit (INDEPENDENT_AMBULATORY_CARE_PROVIDER_SITE_OTHER): Payer: Medicare Other | Admitting: Podiatry

## 2021-12-27 ENCOUNTER — Ambulatory Visit (INDEPENDENT_AMBULATORY_CARE_PROVIDER_SITE_OTHER): Payer: Medicare Other

## 2021-12-27 DIAGNOSIS — M84374A Stress fracture, right foot, initial encounter for fracture: Secondary | ICD-10-CM | POA: Diagnosis not present

## 2021-12-27 NOTE — Progress Notes (Signed)
? ?HPI: 66 y.o. male presenting today for follow-up evaluation of a stress reaction fracture to the second metatarsal of the right foot.  He states he is ambulating with regular shoes no pain.  He just wanted to make sure that everything is doing well.  No acute complaints is still on chemo medication ?Past Medical History:  ?Diagnosis Date  ? AKI (acute kidney injury) ([place]) 01/06/2019  ? Anemia   ? 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[place])   ? 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  ? Fatty liver   ? on u/s 07/2018  ? Hyperlipidemia 03/2004  ? Hypertension 1994  ? Multiple myeloma ([place])   ? Snores   ? ?Past Surgical History:  ?Procedure Laterality Date  ? CATARACT EXTRACTION Left 10/10/2021  ? CATARACT EXTRACTION W/PHACO Right 03/04/2018  ? Procedure: CATARACT EXTRACTION PHACO AND INTRAOCULAR LENS PLACEMENT ([place])  RIGHT TORIC;  Surgeon: Leandrew Koyanagi, MD;  Location: [place];  Service: Ophthalmology;  Laterality: Right;  [place] no Toric Lens 1:45 5.3  ? CATARACT EXTRACTION W/PHACO Left 10/10/2021  ? Procedure: CATARACT EXTRACTION PHACO AND INTRAOCULAR LENS PLACEMENT ([place]) LEFT;  Surgeon: Leandrew Koyanagi, MD;  Location: [place];  Service: Ophthalmology;  Laterality: Left;  5.16 ?1:03.8  ? COLONOSCOPY WITH PROPOFOL N/A 07/20/2018  ? Procedure: COLONOSCOPY WITH PROPOFOL;  Surgeon: Jonathon Bellows, MD;  Location: [facility] ENDOSCOPY;  Service: Gastroenterology;  Laterality: N/A;  ? ESOPHAGOGASTRODUODENOSCOPY (EGD) WITH PROPOFOL N/A 07/20/2018  ? Procedure: ESOPHAGOGASTRODUODENOSCOPY (EGD) WITH PROPOFOL;  Surgeon: Jonathon Bellows, MD;  Location: [facility] ENDOSCOPY;  Service: Gastroenterology;  Laterality: N/A;  ? EYE SURGERY Right   ? HERNIA REPAIR    ? L Lap herniorraphy  04/2000  ? Left wrist ganglionectomy    ? ?Allergies  ?Allergen Reactions  ? Bactrim [Sulfamethoxazole-Trimethoprim]   ?  Elevated creatinine  ? Clonidine Derivatives   ?  Low platelets, edema  ? Tadalafil   ?   Intolerant,  Didn't feel well on med  ? ? ?Physical Exam: ?General: The patient is alert and oriented x3 in no acute distress. ? ?Dermatology: Skin is warm, dry and supple bilateral lower extremities. ? ?Vascular: Palpable pedal pulses bilaterally. No edema or erythema noted. Capillary refill within normal limits. ? ?Neurological: Epicritic and protective threshold grossly intact bilaterally.  ? ?Musculoskeletal Exam: No pedal deformities noted.  Today there is no tenderness to palpation along the second metatarsal ? ?Radiographic Exam:  ?Normal osseous mineralization. Joint spaces preserved.  Subtle stress reaction fracture noted to the second metatarsal of the right foot with periosteal callus formation.  The fracture appears to be healing routinely.  There is improvement since last x-rays taken ? ?Assessment: ?1.  Stress reaction fracture second metatarsal right with routine healing ? ?Plan of Care:  ?1. Patient evaluated. X-Rays reviewed today and compared with x-rays taken last visit.  ?2.  He has been transitioning well.  He is in a regular shoe.  He does not have any pain at the metatarsal site. ?3.  I explained to the patient that the fracture has not healed completely however it demonstrates signs of good healing approximately 85% ?4.  I discussed with the patient that this can take quite some time to heal as he is also on chemotherapy drugs as well which inhibits bone healing.  Patient states understanding.  As long as he is clinically not having pain I discussed with him he can take up to year to heal.  He states understanding ?5.  Return to clinic as needed ? ? ?  ?  ? ? ? ? ?

## 2022-01-01 ENCOUNTER — Other Ambulatory Visit: Payer: Self-pay

## 2022-01-01 ENCOUNTER — Ambulatory Visit: Payer: Medicare Other

## 2022-01-01 DIAGNOSIS — M542 Cervicalgia: Secondary | ICD-10-CM | POA: Diagnosis not present

## 2022-01-01 DIAGNOSIS — M6281 Muscle weakness (generalized): Secondary | ICD-10-CM

## 2022-01-01 DIAGNOSIS — M5412 Radiculopathy, cervical region: Secondary | ICD-10-CM

## 2022-01-01 NOTE — Therapy (Signed)
[facility] ?[address] ?[place], [place], [address] ?Phone: [phone]   Fax:  [phone] ? ?Physical Therapy Treatment ? ?Patient Details  ?Name: Luis Burke ?MRN: [id number] ?Date of Birth: 11-01-1955 ?No data recorded ? ?Encounter Date: 01/01/2022 ? ? PT End of Session - 01/01/22 1700   ? ? Visit Number 5   ? Number of Visits 17   ? Date for PT Re-Evaluation 02/11/22   ? Authorization Type Medicare primary, Aetna secondary   ? Authorization Time Period 12/17/21-02/11/22   ? PT Start Time 1749   ? PT Stop Time [phone]   ? PT Time Calculation (min) 38 min   ? Activity Tolerance Patient tolerated treatment well   ? Behavior During Therapy [facility] for tasks assessed/performed   ? ?  ?  ? ?  ? ? ?Past Medical History:  ?Diagnosis Date  ? AKI (acute kidney injury) ([place]) 01/06/2019  ? Anemia   ? 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  ? Fatty liver   ? on u/s 07/2018  ? Hyperlipidemia 03/2004  ? Hypertension 1994  ? Multiple myeloma ([place])   ? Snores   ? ? ?Past Surgical History:  ?Procedure Laterality Date  ? CATARACT EXTRACTION Left 10/10/2021  ? CATARACT EXTRACTION W/PHACO Right 03/04/2018  ? Procedure: CATARACT EXTRACTION PHACO AND INTRAOCULAR LENS PLACEMENT ([place])  RIGHT TORIC;  Surgeon: Leandrew Koyanagi, MD;  Location: [place];  Service: Ophthalmology;  Laterality: Right;  [place] no Toric Lens 1:45 5.3  ? CATARACT EXTRACTION W/PHACO Left 10/10/2021  ? Procedure: CATARACT EXTRACTION PHACO AND INTRAOCULAR LENS PLACEMENT ([place]) LEFT;  Surgeon: Leandrew Koyanagi, MD;  Location: [place];  Service: Ophthalmology;  Laterality: Left;  5.16 ?1:03.8  ? COLONOSCOPY WITH PROPOFOL N/A 07/20/2018  ? Procedure: COLONOSCOPY WITH PROPOFOL;  Surgeon: Jonathon Bellows, MD;  Location: [facility] ENDOSCOPY;  Service: Gastroenterology;  Laterality: N/A;  ? ESOPHAGOGASTRODUODENOSCOPY (EGD) WITH  PROPOFOL N/A 07/20/2018  ? Procedure: ESOPHAGOGASTRODUODENOSCOPY (EGD) WITH PROPOFOL;  Surgeon: Jonathon Bellows, MD;  [facility] ENDOSCOPY;  Service: Gastroenterology;  Laterality: N/A;  ? EYE SURGERY Right   ? HERNIA REPAIR    ? L Lap herniorraphy  04/2000  ? Left wrist ganglionectomy    ? ? ?There were no vitals filed for this visit. ? ? Subjective Assessment - 01/01/22 0921   ? ? Subjective Pt reports a lot of pain at night yesterday. Pt says pain went from shoulder blade to shoulder and now into left jaw. Pt will talk to his surgeon about this tomorrow.   ? Pertinent History Pt arrives to PT with radiating pain from cervical spine into left upper extremity. Pt is familiar to this clinic for similar symptoms. He states radiating pain typically extends into the upper trap. Recently on one occasion, pain radiated full length of arm; pt went to ER due to significant pain and concern of stroke. Stroke ruled out, pt referred to PT for new significant weakness within [place] joint and is unable to lift his arm above his head in frontal and sagittal planes. At worst, pain is 8/10, best 0/10. 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 Patient is receiving maintenance chemo for multiple myeloma 1x/month with next treatment scheduled for this Thursday. Patient states that pain improved significantly following last bout of PT. He states he continued his HEP for a little while but stopped  and that is when the pain returned. Pt has a past medical history of bone marrow transplant; he is followed by [facility] oncology Dr. Tasia Catchings. Other PMH includes HTN, hyperlipidemia, lumbar stenosis, and anemia. Pt denies any unexplained weight fluctuation, saddle paresthesia, loss of bowel/bladder function, or unrelenting night pain at this time.   ? Currently in Pain? Yes   ? Pain Score 4    ? Pain Location --   shoulder, jaw, scapula left  ? Pain Orientation Left   ? ?  ?  ? ?  ? ? ? ?INTERVENTION THIS DATE: ? ?- Arm ergometer  warm-up, level 4 for 5 minutes (2.5 forward, 2.5 backward)  ?-MFR to Left masseter  5 spots with tenderness decrease, ART to left shoulder with head movement to mobilize plexus  ?-ART release to subscapularis with ER x3 minutes (good pain release)  ?Cervical manual traction  ?-Standing row GTB 1x10 ?-repeated extension over blue belt x15 ?-Standing row GTB 1x10 ?-standing Left shoulder ABDCT 1x10 below shoulder height ?-standing Left shoulder scaption to ~80 degrees with active assistance from author ?-standing Left shoulder ABDCT 1x10 below shoulder height ? ? ? ? ? PT Education - 01/01/22 0925   ? ? Education Details discussed activity modification as it relates to new referral pain   ? Person(s) Educated Patient   ? Methods Explanation   ? Comprehension Verbalized understanding   ? ?  ?  ? ?  ? ? ? PT Short Term Goals - 12/17/21 1759   ? ?  ? PT SHORT TERM GOAL #1  ? Title Pt will demonstrate independence with HEP to improve cervical function for increased ability to participate with ADLs   ? Baseline 3/6: HEP given   ? Time 4   ? Period Weeks   ? Status New   ? Target Date 01/14/22   ? ?  ?  ? ?  ? ? ? ? PT Long Term Goals - 12/17/21 1800   ? ?  ? PT LONG TERM GOAL #1  ? Title Patient will increase FOTO score to equal to or greater than 64 to demonstrate statistically significant improvement in mobility and quality of life.   ? Baseline 3/6: 48   ? Time 8   ? Period Weeks   ? Status New   ? Target Date 02/11/22   ?  ? PT LONG TERM GOAL #2  ? Title Pt will decrease worst cervical pain as reported on NPRS by at least 2 points in order to demonstrate clinically significant reduction in neck pain.   ? Baseline 3/6: 8/10   ? Time 8   ? Period Weeks   ? Status New   ? Target Date 02/11/22   ?  ? PT LONG TERM GOAL #3  ? Title Pt will improve active cervical lateral flexion >40* and extension >60* for improved muscle length ad completion of functional tasks at home.   ? Baseline 3/6: R/L 40* / 20*, E 35*   ? Time 8   ?  Period Weeks   ? Status New   ? Target Date 02/11/22   ?  ? PT LONG TERM GOAL #4  ? Title Pt will increase strength of by at least 1/2 MMT grade throughout entire LUE in order to demonstrate improvement in strength and function.   ? Baseline 3/6: between 2 to 4/5 - please see eval   ? Time 8   ? Period Weeks   ? Status New   ?  Target Date 02/11/22   ? ?  ?  ? ?  ? ? ? ? ? ? ? ? Plan - 01/01/22 0925   ? ? Clinical Impression Statement Additional manual therapy this date to address new areas of soreness. Pt responds favorably with decreased pain in Left jaw and improved. Added in some A/ROM of left shoulder due to severe weakness.   ? Personal Factors and Comorbidities Comorbidity 1   ? Comorbidities Multiple myeloma, chemotherapy, HTN, anemia   ? Examination-Activity Limitations Lift;Reach Overhead;Carry   ? Examination-Participation Restrictions Driving;Cleaning;Community Activity   ? Stability/Clinical Decision Making Evolving/Moderate complexity   ? Clinical Decision Making Moderate   ? Rehab Potential Good   ? PT Frequency 2x / week   ? PT Duration 8 weeks   ? PT Treatment/Interventions ADLs/Self Care Home Management;Cryotherapy;Traction;Therapeutic exercise;Therapeutic activities;Patient/family education;Manual techniques;Passive range of motion;Dry needling;Spinal Manipulations;Joint Manipulations;Neuromuscular re-education;Electrical Stimulation;Aquatic Therapy;DME Instruction;Functional mobility training;Balance training;Taping;Energy conservation   ? PT Next Visit Plan distraction, cervical and upper thoracic mobs, stretches, UE therex, periscapular strength   ? PT Home Exercise Plan chin tucks, Snags, Upper traps stretch   ? ?  ?  ? ?  ? ? ?Patient will benefit from skilled therapeutic intervention in order to improve the following deficits and impairments:  Hypomobility, Pain, Postural dysfunction, Decreased range of motion, Decreased activity tolerance, Decreased strength, Decreased mobility ? ?Visit  Diagnosis: ?Neck pain ? ?Radiculopathy, cervical region ? ?Muscle left arm weakness ? ? ? ? ?Problem List ?Patient Active Problem List  ? Diagnosis Date Noted  ? Medicare welcome exam 07/26/2021  ? Chemotherapy induce

## 2022-01-03 ENCOUNTER — Other Ambulatory Visit: Payer: Self-pay

## 2022-01-03 ENCOUNTER — Ambulatory Visit: Payer: Medicare Other

## 2022-01-03 DIAGNOSIS — M6281 Muscle weakness (generalized): Secondary | ICD-10-CM

## 2022-01-03 DIAGNOSIS — M542 Cervicalgia: Secondary | ICD-10-CM

## 2022-01-03 DIAGNOSIS — M5412 Radiculopathy, cervical region: Secondary | ICD-10-CM

## 2022-01-03 NOTE — Therapy (Signed)
[facility] ?[address] ?[place], [place], [address] ?Phone: [phone]   Fax:  [phone] ? ?Physical Therapy Treatment ? ?Patient Details  ?Name: Luis Burke ?MRN: [id number] ?Date of Birth: [birth year] ?No data recorded ? ?Encounter Date: 01/03/2022 ? ? PT End of Session - 01/03/22 9417   ? ? Visit Number 6   ? Number of Visits 17   ? Date for PT Re-Evaluation 02/11/22   ? Authorization Type Medicare primary, Aetna secondary   ? Authorization Time Period 12/17/21-02/11/22   ? Progress Note Due on Visit 10   ? PT Start Time [phone]   ? PT Stop Time 0955   ? PT Time Calculation (min) 38 min   ? Activity Tolerance Patient tolerated treatment well;No increased pain   ? Behavior During Therapy [facility] for tasks assessed/performed   ? ?  ?  ? ?  ? ? ?Past Medical History:  ?Diagnosis Date  ? AKI (acute kidney injury) ([place]) 01/06/2019  ? Anemia   ? 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  ? Fatty liver   ? on u/s 07/2018  ? Hyperlipidemia 03/2004  ? Hypertension 1994  ? Multiple myeloma ([place])   ? Snores   ? ? ?Past Surgical History:  ?Procedure Laterality Date  ? CATARACT EXTRACTION Left 10/10/2021  ? CATARACT EXTRACTION W/PHACO Right 03/04/2018  ? Procedure: CATARACT EXTRACTION PHACO AND INTRAOCULAR LENS PLACEMENT ([place])  RIGHT TORIC;  Surgeon: Leandrew Koyanagi, MD;  Location: [place];  Service: Ophthalmology;  Laterality: Right;  [place] no Toric Lens 1:45 5.3  ? CATARACT EXTRACTION W/PHACO Left 10/10/2021  ? Procedure: CATARACT EXTRACTION PHACO AND INTRAOCULAR LENS PLACEMENT ([place]) LEFT;  Surgeon: Leandrew Koyanagi, MD;  Location: [place];  Service: Ophthalmology;  Laterality: Left;  5.16 ?1:03.8  ? COLONOSCOPY WITH PROPOFOL N/A 07/20/2018  ? Procedure: COLONOSCOPY WITH PROPOFOL;  Surgeon: Jonathon Bellows, MD;  Location: [facility] ENDOSCOPY;  Service: Gastroenterology;  Laterality:  N/A;  ? ESOPHAGOGASTRODUODENOSCOPY (EGD) WITH PROPOFOL N/A 07/20/2018  ? Procedure: ESOPHAGOGASTRODUODENOSCOPY (EGD) WITH PROPOFOL;  Surgeon: Jonathon Bellows, MD;  Location: [facility] ENDOSCOPY;  Service: Gastroenterology;  Laterality: N/A;  ? EYE SURGERY Right   ? HERNIA REPAIR    ? L Lap herniorraphy  04/2000  ? Left wrist ganglionectomy    ? ? ?There were no vitals filed for this visit. ? ? Subjective Assessment - 01/03/22 0921   ? ? Subjective Pt reports good relief of jaw pain after last session. His shoulder on left continues to hurt particularly at night. today lef tshoulder is 2-3/10. P tnow improved for a neck injection with neurosurgery.   ? Pertinent History Pt arrives to PT with radiating pain from cervical spine into left upper extremity. Pt is familiar to this clinic for similar symptoms. He states radiating pain typically extends into the upper trap. Recently on one occasion, pain radiated full length of arm; pt went to ER due to significant pain and concern of stroke. Stroke ruled out, pt referred to PT for new significant weakness within [place] joint and is unable to lift his arm above his head in frontal and sagittal planes. At worst, pain is 8/10, best 0/10. 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 Patient is receiving maintenance chemo for multiple myeloma 1x/month with next treatment scheduled for this Thursday. Patient states that pain improved significantly following last bout of  PT. He states he continued his HEP for a little while but stopped and that is when the pain returned. Pt has a past medical history of bone marrow transplant; he is followed by [facility] oncology Dr. Tasia Catchings. Other PMH includes HTN, hyperlipidemia, lumbar stenosis, and anemia. Pt denies any unexplained weight fluctuation, saddle paresthesia, loss of bowel/bladder function, or unrelenting night pain at this time.   ? ?  ?  ? ?  ? ?INTERVENTION THIS DATE: ?  ?- Arm ergometer warm-up, level 4 for 5 minutes (2.5 forward,  2.5 backward)  ?-seated Left suboccipital stretch 2x30sec, then capitla nod mobilization in left and right ?-LUE overhead reach (short level) 1x15 c modA from author  ?-standing cable row 1x15 @ 15lb ?-LUE overhead reach (short level) 1x15 c modA from author  ?-standing cable row 1x15 @ 15lb ?*Seated LUE overhead reach with pulleys 1x20 (trial for home)  ? ?-LUE shoulder ABDCT to 90 degrees 1x10 c minA  ?Recovery break  ?-LUE flexion 0-90 1x8 (fatigues quickly)  ?-LUE elbow flexion 1x12 @ 6lb  ?-wall pushups standing 1x12 ? ? ? ? ? PT Short Term Goals - 12/17/21 1759   ? ?  ? PT SHORT TERM GOAL #1  ? Title Pt will demonstrate independence with HEP to improve cervical function for increased ability to participate with ADLs   ? Baseline 3/6: HEP given   ? Time 4   ? Period Weeks   ? Status New   ? Target Date 01/14/22   ? ?  ?  ? ?  ? ? ? ? PT Long Term Goals - 12/17/21 1800   ? ?  ? PT LONG TERM GOAL #1  ? Title Patient will increase FOTO score to equal to or greater than 64 to demonstrate statistically significant improvement in mobility and quality of life.   ? Baseline 3/6: 48   ? Time 8   ? Period Weeks   ? Status New   ? Target Date 02/11/22   ?  ? PT LONG TERM GOAL #2  ? Title Pt will decrease worst cervical pain as reported on NPRS by at least 2 points in order to demonstrate clinically significant reduction in neck pain.   ? Baseline 3/6: 8/10   ? Time 8   ? Period Weeks   ? Status New   ? Target Date 02/11/22   ?  ? PT LONG TERM GOAL #3  ? Title Pt will improve active cervical lateral flexion >40* and extension >60* for improved muscle length ad completion of functional tasks at home.   ? Baseline 3/6: R/L 40* / 20*, E 35*   ? Time 8   ? Period Weeks   ? Status New   ? Target Date 02/11/22   ?  ? PT LONG TERM GOAL #4  ? Title Pt will increase strength of by at least 1/2 MMT grade throughout entire LUE in order to demonstrate improvement in strength and function.   ? Baseline 3/6: between 2 to 4/5 - please  see eval   ? Time 8   ? Period Weeks   ? Status New   ? Target Date 02/11/22   ? ?  ?  ? ?  ? ? ? ? ? ? ? ? Plan - 01/03/22 0924   ? ? Clinical Impression Statement Still working on pain management in neck adn shoulder as well as improving strength in arms. Pt able to complete session as planned without pain  exacerbation.   ? Personal Factors and Comorbidities Comorbidity 1   ? Comorbidities Multiple myeloma, chemotherapy, HTN, anemia   ? Examination-Activity Limitations Lift;Reach Overhead;Carry   ? Examination-Participation Restrictions Driving;Cleaning;Community Activity   ? Stability/Clinical Decision Making Evolving/Moderate complexity   ? Clinical Decision Making Moderate   ? Rehab Potential Good   ? PT Frequency 2x / week   ? PT Duration 8 weeks   ? PT Treatment/Interventions ADLs/Self Care Home Management;Cryotherapy;Traction;Therapeutic exercise;Therapeutic activities;Patient/family education;Manual techniques;Passive range of motion;Dry needling;Spinal Manipulations;Joint Manipulations;Neuromuscular re-education;Electrical Stimulation;Aquatic Therapy;DME Instruction;Functional mobility training;Balance training;Taping;Energy conservation   ? PT Next Visit Plan distraction, cervical and upper thoracic mobs, stretches, UE therex, periscapular strength   ? PT Home Exercise Plan chin tucks, Snags, Upper traps stretch   ? Consulted and Agree with Plan of Care Patient   ? ?  ?  ? ?  ? ? ?Patient will benefit from skilled therapeutic intervention in order to improve the following deficits and impairments:  Hypomobility, Pain, Postural dysfunction, Decreased range of motion, Decreased activity tolerance, Decreased strength, Decreased mobility ? ?Visit Diagnosis: ?Neck pain ? ?Radiculopathy, cervical region ? ?Muscle left arm weakness ? ? ? ? ?Problem List ?Patient Active Problem List  ? Diagnosis Date Noted  ? Medicare welcome exam 07/26/2021  ? Chemotherapy induced neutropenia ([place]) 07/19/2021  ? Bone lesion  06/05/2021  ? Chronic midline low back pain without sciatica 06/05/2021  ? Neck pain, chronic 08/11/2020  ? Lumbar degenerative disc disease 08/10/2020  ? Lumbar foraminal stenosis 08/09/2020  ? Thrombocytopen

## 2022-01-07 ENCOUNTER — Telehealth: Payer: Self-pay

## 2022-01-07 ENCOUNTER — Encounter: Payer: Self-pay | Admitting: Oncology

## 2022-01-07 NOTE — Telephone Encounter (Signed)
Please schedule IVIG *new* this week and inform pt of appt. Pt prefers after 3/29.  ?

## 2022-01-07 NOTE — Addendum Note (Signed)
Addended by: Earlie Server on: 01/07/2022 10:11 AM ? ? Modules accepted: Orders ? ?

## 2022-01-07 NOTE — Telephone Encounter (Signed)
-----   Message from Earlie Server, MD sent at 01/07/2022 10:13 AM EDT ----- ?Please schedule him to get IVIG done this week [ after 3/29,  as he has a procedure that day]. Order set is in.  ?I have called patient and discussed the rationale and side effects. He agrees with the plan.  ?

## 2022-01-07 NOTE — Telephone Encounter (Signed)
01/07/2022 ?Pt confirmed appt on 4/3 for *NEW* IVIG.  ?SRW  ?

## 2022-01-08 ENCOUNTER — Other Ambulatory Visit: Payer: Self-pay

## 2022-01-08 ENCOUNTER — Ambulatory Visit: Payer: Medicare Other

## 2022-01-08 DIAGNOSIS — M6281 Muscle weakness (generalized): Secondary | ICD-10-CM

## 2022-01-08 DIAGNOSIS — M5412 Radiculopathy, cervical region: Secondary | ICD-10-CM

## 2022-01-08 DIAGNOSIS — M542 Cervicalgia: Secondary | ICD-10-CM

## 2022-01-08 NOTE — Therapy (Signed)
[facility] ?[address] ?[place], [place], [address] ?Phone: [phone]   Fax:  [phone] ? ?Physical Therapy Treatment ? ?Patient Details  ?Name: Luis Burke ?MRN: [id number] ?Date of Birth: 07/09/1956 ?No data recorded ? ?Encounter Date: 01/08/2022 ? ? PT End of Session - 01/08/22 0936   ? ? Visit Number 7   ? Number of Visits 17   ? Date for PT Re-Evaluation 02/11/22   ? Authorization Type Medicare primary, [employer] secondary   ? Authorization Time Period 12/17/21-02/11/22   ? Progress Note Due on Visit 10   ? PT Start Time [phone]   ? PT Stop Time 2536   ? PT Time Calculation (min) 40 min   ? Equipment Utilized During Treatment Gait belt   ? Activity Tolerance Patient tolerated treatment well;No increased pain   ? Behavior During Therapy [facility] for tasks assessed/performed   ? ?  ?  ? ?  ? ? ?Past Medical History:  ?Diagnosis Date  ? AKI (acute kidney injury) ([place]) 01/06/2019  ? Anemia   ? 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  ? Fatty liver   ? on u/s 07/2018  ? Hyperlipidemia 03/2004  ? Hypertension 1994  ? Multiple myeloma ([place])   ? Snores   ? ? ?Past Surgical History:  ?Procedure Laterality Date  ? CATARACT EXTRACTION Left 10/10/2021  ? CATARACT EXTRACTION W/PHACO Right 03/04/2018  ? Procedure: CATARACT EXTRACTION PHACO AND INTRAOCULAR LENS PLACEMENT ([place])  RIGHT TORIC;  Surgeon: Leandrew Koyanagi, MD;  Location: [facility];  Service: Ophthalmology;  Laterality: Right;  Per [place] no Toric Lens 1:45 5.3  ? CATARACT EXTRACTION W/PHACO Left 10/10/2021  ? Procedure: CATARACT EXTRACTION PHACO AND INTRAOCULAR LENS PLACEMENT ([place]) LEFT;  Surgeon: Leandrew Koyanagi, MD;  Location: [facility];  Service: Ophthalmology;  Laterality: Left;  5.16 ?1:03.8  ? COLONOSCOPY WITH PROPOFOL N/A 07/20/2018  ? Procedure: COLONOSCOPY WITH PROPOFOL;  Surgeon: Jonathon Bellows, MD;  Location: [facility]  ENDOSCOPY;  Service: Gastroenterology;  Laterality: N/A;  ? ESOPHAGOGASTRODUODENOSCOPY (EGD) WITH PROPOFOL N/A 07/20/2018  ? Procedure: ESOPHAGOGASTRODUODENOSCOPY (EGD) WITH PROPOFOL;  Surgeon: Jonathon Bellows, MD;  Location: [facility] ENDOSCOPY;  Service: Gastroenterology;  Laterality: N/A;  ? EYE SURGERY Right   ? HERNIA REPAIR    ? L Lap herniorraphy  04/2000  ? Left wrist ganglionectomy    ? ? ?There were no vitals filed for this visit. ? ? Subjective Assessment - 01/08/22 0931   ? ? Subjective Pt doing well today. No pain at left occipital area as with last session. Pt slept ~ 8 hours 2 nights back which was a first in a while. Pt is working with pulleys now 1x which went very well.   ? Pertinent History Pt arrives to PT with radiating pain from cervical spine into left upper extremity. Pt is familiar to this clinic for similar symptoms. He states radiating pain typically extends into the upper trap. Recently on one occasion, pain radiated full length of arm; pt went to ER due to significant pain and concern of stroke. Stroke ruled out, pt referred to PT for new significant weakness within [place] joint and is unable to lift his arm above his head in frontal and sagittal planes. At worst, pain is 8/10, best 0/10. 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 Patient is receiving maintenance chemo for multiple myeloma 1x/month with next  treatment scheduled for this Thursday. Patient states that pain improved significantly following last bout of PT. He states he continued his HEP for a little while but stopped and that is when the pain returned. Pt has a past medical history of bone marrow transplant; he is followed by [facility] oncology Dr. Tasia Catchings. Other PMH includes HTN, hyperlipidemia, lumbar stenosis, and anemia. Pt denies any unexplained weight fluctuation, saddle paresthesia, loss of bowel/bladder function, or unrelenting night pain at this time.   ? Currently in Pain? No/denies   ? ?  ?  ? ?  ? ? ?INTERVENTION  THIS DATE: ?  ?- Arm ergometer warm-up, level 4 for 5 minutes, alternating direction Q60sec  ?-seated Left suboccipital stretch 2x30sec, then capital nod mobilization in left and right ? ?-standing cable row 1x12 @ 20lb ?-LUE overhead reach (short level) 1x12 c modA from author after 4 ?-Standing pushup on treadmill bar 1x15  ?-LUE elbow flexion 1x12 @ 6lb  ? ?-standing cable row 1x12 @ 20lb ?-LUE overhead reach (short level) 1x12 c modA from author after 4 ?-Standing pushup on treadmill bar 1x15  ?-LUE elbow flexion 1x12 @ 6lb  ? ?-Neck scarf triceps extension BTB 1x12  ?-seated LUE ER, elbow supported 2lb FW 1x10 ?-standing LUE shoulder ABDCT to 90 1x10 (modA after 5)  ? ?-Neck scarf triceps extension BTB 1x12  ?-seated LUE ER, elbow supported 2lb FW 1x10 ?-standing LUE shoulder ABDCT to 90 1x10 (modA after 5)  ? ? ? PT Education - 01/08/22 0937   ? ? Education Details gradual progression of exercises at home   ? Person(s) Educated Patient   ? Methods Explanation   ? Comprehension Verbalized understanding   ? ?  ?  ? ?  ? ? ? PT Short Term Goals - 12/17/21 1759   ? ?  ? PT SHORT TERM GOAL #1  ? Title Pt will demonstrate independence with HEP to improve cervical function for increased ability to participate with ADLs   ? Baseline 3/6: HEP given   ? Time 4   ? Period Weeks   ? Status New   ? Target Date 01/14/22   ? ?  ?  ? ?  ? ? ? ? PT Long Term Goals - 12/17/21 1800   ? ?  ? PT LONG TERM GOAL #1  ? Title Patient will increase FOTO score to equal to or greater than 64 to demonstrate statistically significant improvement in mobility and quality of life.   ? Baseline 3/6: 48   ? Time 8   ? Period Weeks   ? Status New   ? Target Date 02/11/22   ?  ? PT LONG TERM GOAL #2  ? Title Pt will decrease worst cervical pain as reported on NPRS by at least 2 points in order to demonstrate clinically significant reduction in neck pain.   ? Baseline 3/6: 8/10   ? Time 8   ? Period Weeks   ? Status New   ? Target Date 02/11/22    ?  ? PT LONG TERM GOAL #3  ? Title Pt will improve active cervical lateral flexion >40* and extension >60* for improved muscle length ad completion of functional tasks at home.   ? Baseline 3/6: R/L 40* / 20*, E 35*   ? Time 8   ? Period Weeks   ? Status New   ? Target Date 02/11/22   ?  ? PT LONG TERM GOAL #4  ? Title Pt  will increase strength of by at least 1/2 MMT grade throughout entire LUE in order to demonstrate improvement in strength and function.   ? Baseline 3/6: between 2 to 4/5 - please see eval   ? Time 8   ? Period Weeks   ? Status New   ? Target Date 02/11/22   ? ?  ?  ? ?  ? ? ? ? ? ? ? ? Plan - 01/08/22 0938   ? ? Clinical Impression Statement Continued to work on global left shoulder strength, showing improvements. ABDCT and flexion remain profoundly weak, require modA after 5x, but are getting stronger. Neck pain improving quite a bit, but stretches and mobilization are repeated today. Pt has upcoming spine injection and other infusions, will call if feeling too fatigued to do PT session.   ? Personal Factors and Comorbidities Comorbidity 1   ? Comorbidities Multiple myeloma, chemotherapy, HTN, anemia   ? Examination-Activity Limitations Lift;Reach Overhead;Carry   ? Examination-Participation Restrictions Driving;Cleaning;Community Activity   ? Stability/Clinical Decision Making Evolving/Moderate complexity   ? Clinical Decision Making Moderate   ? Rehab Potential Good   ? PT Frequency 2x / week   ? PT Duration 8 weeks   ? PT Treatment/Interventions ADLs/Self Care Home Management;Cryotherapy;Traction;Therapeutic exercise;Therapeutic activities;Patient/family education;Manual techniques;Passive range of motion;Dry needling;Spinal Manipulations;Joint Manipulations;Neuromuscular re-education;Electrical Stimulation;Aquatic Therapy;DME Instruction;Functional mobility training;Balance training;Taping;Energy conservation   ? PT Next Visit Plan distraction, cervical and upper thoracic mobs, stretches, UE  therex, periscapular strength   ? PT Home Exercise Plan chin tucks, Snags, Upper traps stretch, pulleys   ? Consulted and Agree with Plan of Care Patient   ? ?  ?  ? ?  ? ? ?Patient will benefit from skill

## 2022-01-10 ENCOUNTER — Ambulatory Visit: Payer: Medicare Other | Admitting: Physical Therapy

## 2022-01-14 ENCOUNTER — Inpatient Hospital Stay: Payer: Medicare Other | Attending: Oncology

## 2022-01-14 VITALS — BP 135/93 | HR 61 | Temp 96.0°F | Resp 18

## 2022-01-14 DIAGNOSIS — Z5112 Encounter for antineoplastic immunotherapy: Secondary | ICD-10-CM | POA: Diagnosis not present

## 2022-01-14 DIAGNOSIS — N1831 Chronic kidney disease, stage 3a: Secondary | ICD-10-CM | POA: Insufficient documentation

## 2022-01-14 DIAGNOSIS — D801 Nonfamilial hypogammaglobulinemia: Secondary | ICD-10-CM | POA: Insufficient documentation

## 2022-01-14 DIAGNOSIS — N179 Acute kidney failure, unspecified: Secondary | ICD-10-CM

## 2022-01-14 DIAGNOSIS — D6959 Other secondary thrombocytopenia: Secondary | ICD-10-CM | POA: Diagnosis not present

## 2022-01-14 DIAGNOSIS — C9001 Multiple myeloma in remission: Secondary | ICD-10-CM | POA: Insufficient documentation

## 2022-01-14 MED ORDER — DEXTROSE 5 % IV SOLN
Freq: Once | INTRAVENOUS | Status: AC
  Filled 2022-01-14: qty 250

## 2022-01-14 MED ORDER — DIPHENHYDRAMINE HCL 25 MG PO CAPS
25.0000 mg | ORAL_CAPSULE | Freq: Once | ORAL | Status: AC
  Administered 2022-01-14: 25 mg via ORAL
  Filled 2022-01-14: qty 1

## 2022-01-14 MED ORDER — ACETAMINOPHEN 325 MG PO TABS
650.0000 mg | ORAL_TABLET | Freq: Once | ORAL | Status: AC
  Administered 2022-01-14: 650 mg via ORAL
  Filled 2022-01-14: qty 2

## 2022-01-14 MED ORDER — IMMUNE GLOBULIN (HUMAN) 40 GM/400ML IV SOLN
40.0000 g | Freq: Once | INTRAVENOUS | Status: AC
  Administered 2022-01-14: 40 g via INTRAVENOUS
  Filled 2022-01-14: qty 400

## 2022-01-14 NOTE — Patient Instructions (Signed)
[facility] [place]  Discharge Instructions: ?Thank you for choosing [facility] to provide your oncology and hematology care.  ?If you have a lab appointment with the [facility], please go directly to the [facility] and check in at the registration area. ? ?Wear comfortable clothing and clothing appropriate for easy access to any Portacath or PICC line.  ? ?We strive to give you quality time with your provider. You may need to reschedule your appointment if you arrive late (15 or more minutes).  Arriving late affects you and other patients whose appointments are after yours.  Also, if you miss three or more appointments without notifying the office, you may be dismissed from the clinic at the provider?s discretion.    ?  ?For prescription refill requests, have your pharmacy contact our office and allow 72 hours for refills to be completed.   ? ?Today you received the following chemotherapy and/or immunotherapy agents : IVIG ? ?Immune Globulin Injection ?What is this medication? ?IMMUNE GLOBULIN (im MUNE  GLOB yoo lin) helps to prevent or reduce the severity of certain infections in patients who are at risk. This medicine is collected from the pooled blood of many donors. It is used to treat immune system problems, thrombocytopenia, and Kawasaki syndrome. ?This medicine may be used for other purposes; ask your health care provider or pharmacist if you have questions. ?COMMON BRAND NAME(S): ASCENIV, Baygam, BIVIGAM, Carimune, Carimune NF, cutaquig, Cuvitru, Flebogamma, Flebogamma DIF, GamaSTAN, GamaSTAN S/D, Gamimune N, Gammagard, Gammagard S/D, Gammaked, Gammaplex, Gammar-P IV, Gamunex, Gamunex-C, Hizentra, Iveegam, Iveegam EN, Octagam, Panglobulin, Panglobulin NF, panzyga, Polygam S/D, Privigen, Sandoglobulin, Venoglobulin-S, Vigam, Vivaglobulin, Xembify ?What should I tell my care team before I take this medication? ?They need to know if you have any of these  conditions: ?diabetes ?extremely low or no immune antibodies in the blood ?heart disease ?history of blood clots ?hyperprolinemia ?infection in the blood, sepsis ?kidney disease ?recently received or scheduled to receive a vaccination ?an unusual or allergic reaction to human immune globulin, albumin, maltose, sucrose, other medicines, foods, dyes, or preservatives ?pregnant or trying to get pregnant ?breast-feeding ?How should I use this medication? ?This medicine is for injection into a muscle or infusion into a vein or skin. It is usually given by a health care professional in a hospital or clinic setting. ?In rare cases, some brands of this medicine might be given at home. You will be taught how to give this medicine. Use exactly as directed. Take your medicine at regular intervals. Do not take your medicine more often than directed. ?Talk to your pediatrician regarding the use of this medicine in children. While this drug may be prescribed for selected conditions, precautions do apply. ?Overdosage: If you think you have taken too much of this medicine contact a poison control center or emergency room at once. ?NOTE: This medicine is only for you. Do not share this medicine with others. ?What if I miss a dose? ?It is important not to miss your dose. Call your doctor or health care professional if you are unable to keep an appointment. If you give yourself the medicine and you miss a dose, take it as soon as you can. If it is almost time for your next dose, take only that dose. Do not take double or extra doses. ?What may interact with this medication? ?aspirin and aspirin-like medicines ?cisplatin ?cyclosporine ?medicines for infection like acyclovir, adefovir, amphotericin B, bacitracin, cidofovir, foscarnet, ganciclovir, gentamicin, pentamidine, vancomycin ?NSAIDS, medicines  for pain and inflammation, like ibuprofen or naproxen ?pamidronate ?vaccines ?zoledronic acid ?This list may not describe all possible  interactions. Give your health care provider a list of all the medicines, herbs, non-prescription drugs, or dietary supplements you use. Also tell them if you smoke, drink alcohol, or use illegal drugs. Some items may interact with your medicine. ?What should I watch for while using this medication? ?Your condition will be monitored carefully while you are receiving this medicine. ?This medicine is made from pooled blood donations of many different people. It may be possible to pass an infection in this medicine. However, the donors are screened for infections and all products are tested for HIV and hepatitis. The medicine is treated to kill most or all bacteria and viruses. Talk to your doctor about the risks and benefits of this medicine. ?Do not have vaccinations for at least 14 days before, or until at least 3 months after receiving this medicine. ?What side effects may I notice from receiving this medication? ?Side effects that you should report to your doctor or health care professional as soon as possible: ?allergic reactions like skin rash, itching or hives, swelling of the face, lips, or tongue ?blue colored lips or skin ?breathing problems ?chest pain or tightness ?fever ?signs and symptoms of aseptic meningitis such as stiff neck; sensitivity to light; headache; drowsiness; fever; nausea; vomiting; rash ?signs and symptoms of a blood clot such as chest pain; shortness of breath; pain, swelling, or warmth in the leg ?signs and symptoms of hemolytic anemia such as fast heartbeat; tiredness; dark yellow or brown urine; or yellowing of the eyes or skin ?signs and symptoms of kidney injury like trouble passing urine or change in the amount of urine ?sudden weight gain ?swelling of the ankles, feet, hands ?Side effects that usually do not require medical attention (report to your doctor or health care professional if they continue or are bothersome): ?diarrhea ?flushing ?headache ?increased sweating ?joint  pain ?muscle cramps ?muscle pain ?nausea ?pain, redness, or irritation at site where injected ?tiredness ?This list may not describe all possible side effects. Call your doctor for medical advice about side effects. You may report side effects to FDA at 1-800-FDA-1088. ?Where should I keep my medication? ?Keep out of the reach of children. ?This drug is usually given in a hospital or clinic and will not be stored at home. ?In rare cases, some brands of this medicine may be given at home. If you are using this medicine at home, you will be instructed on how to store this medicine. Throw away any unused medicine after the expiration date on the label. ?NOTE: This sheet is a summary. It may not cover all possible information. If you have questions about this medicine, talk to your doctor, pharmacist, or health care provider. ?? 2022 Elsevier/Gold Standard (2019-05-05 00:00:00) ? ?  ?To help prevent nausea and vomiting after your treatment, we encourage you to take your nausea medication as directed. ? ?BELOW ARE SYMPTOMS THAT SHOULD BE REPORTED IMMEDIATELY: ?*FEVER GREATER THAN 100.4 F (38 ?C) OR HIGHER ?*CHILLS OR SWEATING ?*NAUSEA AND VOMITING THAT IS NOT CONTROLLED WITH YOUR NAUSEA MEDICATION ?*UNUSUAL SHORTNESS OF BREATH ?*UNUSUAL BRUISING OR BLEEDING ?*URINARY PROBLEMS (pain or burning when urinating, or frequent urination) ?*BOWEL PROBLEMS (unusual diarrhea, constipation, pain near the anus) ?TENDERNESS IN MOUTH AND THROAT WITH OR WITHOUT PRESENCE OF ULCERS (sore throat, sores in mouth, or a toothache) ?UNUSUAL RASH, SWELLING OR PAIN  ?UNUSUAL VAGINAL DISCHARGE OR ITCHING  ? ?Items  with * indicate a potential emergency and should be followed up as soon as possible or go to the Emergency Department if any problems should occur. ? ?Please show the CHEMOTHERAPY ALERT CARD or IMMUNOTHERAPY ALERT CARD at check-in to the Emergency Department and triage nurse. ? ?Should you have questions after your visit or need to  cancel or reschedule your appointment, please contact [facility] [place] [facility] [place]  [phone] and follow the prompts.  Office hours are 8:00 a.m. to 4:30 p.m. Monday - Friday. Please note that voice

## 2022-01-15 ENCOUNTER — Ambulatory Visit: Payer: Medicare Other | Admitting: Physical Therapy

## 2022-01-17 ENCOUNTER — Encounter: Payer: Medicare Other | Admitting: Physical Therapy

## 2022-01-17 ENCOUNTER — Encounter: Payer: Self-pay | Admitting: Oncology

## 2022-01-17 ENCOUNTER — Inpatient Hospital Stay: Payer: Medicare Other

## 2022-01-17 ENCOUNTER — Inpatient Hospital Stay (HOSPITAL_BASED_OUTPATIENT_CLINIC_OR_DEPARTMENT_OTHER): Payer: Medicare Other | Admitting: Oncology

## 2022-01-17 VITALS — BP 158/98 | HR 71 | Temp 97.9°F | Ht 70.0 in | Wt 208.0 lb

## 2022-01-17 DIAGNOSIS — N179 Acute kidney failure, unspecified: Secondary | ICD-10-CM

## 2022-01-17 DIAGNOSIS — Z5111 Encounter for antineoplastic chemotherapy: Secondary | ICD-10-CM

## 2022-01-17 DIAGNOSIS — D696 Thrombocytopenia, unspecified: Secondary | ICD-10-CM | POA: Diagnosis not present

## 2022-01-17 DIAGNOSIS — C9001 Multiple myeloma in remission: Secondary | ICD-10-CM | POA: Diagnosis not present

## 2022-01-17 DIAGNOSIS — C9000 Multiple myeloma not having achieved remission: Secondary | ICD-10-CM

## 2022-01-17 DIAGNOSIS — Z5112 Encounter for antineoplastic immunotherapy: Secondary | ICD-10-CM | POA: Diagnosis not present

## 2022-01-17 DIAGNOSIS — N1831 Chronic kidney disease, stage 3a: Secondary | ICD-10-CM | POA: Diagnosis not present

## 2022-01-17 LAB — CBC WITH DIFFERENTIAL/PLATELET
Abs Immature Granulocytes: 0.02 10*3/uL (ref 0.00–0.07)
Basophils Absolute: 0 10*3/uL (ref 0.0–0.1)
Basophils Relative: 0 %
Eosinophils Absolute: 0.1 10*3/uL (ref 0.0–0.5)
Eosinophils Relative: 1 %
HCT: 38 % — ABNORMAL LOW (ref 39.0–52.0)
Hemoglobin: 12.9 g/dL — ABNORMAL LOW (ref 13.0–17.0)
Immature Granulocytes: 0 %
Lymphocytes Relative: 37 %
Lymphs Abs: 2.1 10*3/uL (ref 0.7–4.0)
MCH: 34 pg (ref 26.0–34.0)
MCHC: 33.9 g/dL (ref 30.0–36.0)
MCV: 100.3 fL — ABNORMAL HIGH (ref 80.0–100.0)
Monocytes Absolute: 0.8 10*3/uL (ref 0.1–1.0)
Monocytes Relative: 13 %
Neutro Abs: 2.8 10*3/uL (ref 1.7–7.7)
Neutrophils Relative %: 49 %
Platelets: 76 10*3/uL — ABNORMAL LOW (ref 150–400)
RBC: 3.79 MIL/uL — ABNORMAL LOW (ref 4.22–5.81)
RDW: 14.6 % (ref 11.5–15.5)
WBC: 5.7 10*3/uL (ref 4.0–10.5)
nRBC: 0 % (ref 0.0–0.2)

## 2022-01-17 LAB — COMPREHENSIVE METABOLIC PANEL
ALT: 19 U/L (ref 0–44)
AST: 22 U/L (ref 15–41)
Albumin: 4.4 g/dL (ref 3.5–5.0)
Alkaline Phosphatase: 55 U/L (ref 38–126)
Anion gap: 8 (ref 5–15)
BUN: 24 mg/dL — ABNORMAL HIGH (ref 8–23)
CO2: 25 mmol/L (ref 22–32)
Calcium: 9.6 mg/dL (ref 8.9–10.3)
Chloride: 101 mmol/L (ref 98–111)
Creatinine, Ser: 1.21 mg/dL (ref 0.61–1.24)
GFR, Estimated: >60 mL/min (ref >60)
Glucose, Bld: 102 mg/dL — ABNORMAL HIGH (ref 70–99)
Potassium: 3.9 mmol/L (ref 3.5–5.1)
Sodium: 134 mmol/L — ABNORMAL LOW (ref 135–145)
Total Bilirubin: 0.8 mg/dL (ref 0.3–1.2)
Total Protein: 7.4 g/dL (ref 6.5–8.1)

## 2022-01-17 MED ORDER — ACETAMINOPHEN 325 MG PO TABS
650.0000 mg | ORAL_TABLET | Freq: Once | ORAL | Status: AC
  Administered 2022-01-17: 650 mg via ORAL
  Filled 2022-01-17: qty 2

## 2022-01-17 MED ORDER — DENOSUMAB 120 MG/1.7ML ~~LOC~~ SOLN
120.0000 mg | Freq: Once | SUBCUTANEOUS | Status: AC
  Administered 2022-01-17: 120 mg via SUBCUTANEOUS
  Filled 2022-01-17: qty 1.7

## 2022-01-17 MED ORDER — DEXAMETHASONE 4 MG PO TABS
20.0000 mg | ORAL_TABLET | Freq: Once | ORAL | Status: AC
  Administered 2022-01-17: 20 mg via ORAL
  Filled 2022-01-17: qty 5

## 2022-01-17 MED ORDER — DIPHENHYDRAMINE HCL 25 MG PO CAPS
50.0000 mg | ORAL_CAPSULE | Freq: Once | ORAL | Status: AC
  Administered 2022-01-17: 50 mg via ORAL
  Filled 2022-01-17: qty 2

## 2022-01-17 MED ORDER — DARATUMUMAB-HYALURONIDASE-FIHJ 1800-30000 MG-UT/15ML ~~LOC~~ SOLN
1800.0000 mg | Freq: Once | SUBCUTANEOUS | Status: AC
  Administered 2022-01-17: 1800 mg via SUBCUTANEOUS
  Filled 2022-01-17: qty 15

## 2022-01-17 NOTE — Progress Notes (Signed)
?Hematology/Oncology Progress note ?[phone] [phone] ?  ? ? ? ?Patient Care Team: ?Luis Ghent, MD as PCP - General (Family Medicine) ?Earlie Server, MD as Consulting Physician (Oncology) ?Marinell Blight, MD as Referring Physician (Hematology and Oncology) ?Diannia Ruder, MD as Referring Physician (Hematology and Oncology) ? ?REASON FOR VISIT ?Follow up for multiple myeloma.  ? ?HISTORY OF PRESENTING ILLNESS:  ?Luis Burke is a  66 y.o.  male with PMH listed below presents for follow up of multiple myeloma.  ?# 07/27/2018 multiple myeloma panel showed M protein of 0.1, IgG 662, IgA 49, IgM 7. ?Patient was called back to further labs done. ?08/10/2018, free light chain ratio showed extremely high level of kappa free light chain 10,183, with a kappa lambda light chain ratio of 1414.31 ?LDH 164 ?Beta-2 microglobulin 5 ?Patient was called and discuss about results.  He was recommended to undergo bone marrow biopsy and PET scan. ?08/19/2018 bone marrow biopsy showed hypercellular marrow 80%, involved by plasma cell neoplasm up to 95%.  Consistent with plasma cell myeloma. Myeloma FISH  gain of gain of CEP12 ? ?09/10/2018 skeletal survey showed questionable small lucent lesions within the midshaft of the humerus bilaterally.  Otherwise no suspicious focal lytic lesion or acute bone abnormality. ?08/25/2018 PET scan showed no definite hypermetabolic bone disease but CT findings are highly suspicious for numerous small myelomatous lesions involving the spine, sternum and scattered ribs. ? 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 ?He received preparative regimen with melphalan 200 mg/m? on 04/22/2019 followed by autologous stem cell infusion on 04/23/2019. ?Currently he is transfusion independent.  Transfusion criteria with as needed hemoglobin less than 7.5 for platelet less than 10,000.  Transplant course was complicated with febrile neutropenia grade 3,  treated with vancomycin and cephapirin until engraftment on 05/06/2019. ?Patient also had engraftment syndrome and had to be started on Solu-Medrol 25 mg twice daily on 05/05/2019.  Transition to Medrol Dosepak on 05/07/2019 to complete steroid taper as outpatient.  Patient is currently on acyclovir for 1 year after transplant, dose was reduced on 722 11/20/2018 due to renal function. ?Patient had a baseline CKD with creatinine running between 1.4-1.6.  He had acute on chronic kidney injury and creatinine went up to 2.2 on 722.  Fluid hydration was given and creatinine decreased to baseline 1.5-1.7. ?His Hickman catheter was removed.  Candida Cruris treated with topical clotrimazole 1% 3 times daily and to resolution.. ? ?#  seen by Dr. Tonia Burke BMT team on 06/30/2019.revaccination at [place] begins at 12 months post transplant ?I discussed with [facility] hematology Dr.Choi and he recommends plans as listed below.  ?-patient to be started on Ixazomib maintenance: -Ixazomib 55m on D1/D8/D15 out of 28 day cycle.  ?-patient will start re-vaccination 12 months post transplant.  ?-Post transplant bone marrow biopsy if clinically indicated.  ?# seen by [facility] bone marrow transplant team in June 2021.  Patient has been started on immunization protocol.  He reports no new complaints. ? ?#[facility].  Lucent lesion in the L4 spinous process measuring 4 mm which is  ?nonspecific. Consider confirmation of marrow replacing process with MRI.  ?2.  Incompletely characterized renal cysts  ? ?# He continues to have chronic back pain and neck pain.  [facility] ?08/05/2020, MRI lumbar spine with and without contrast showed no evidence of spinal metastasis.  Lumbar spondylosis is most pronounced at L5-S1 where  there is severe right and moderate left neural foraminal stenosis. ?08/11/2020, x-ray cervical spine complete with flexion and extension showed ?The cervical spine is visualized from C1 to the  top of T1 on the lateral  view.  ?Reversal of the normal cervical lordosis with a focal kyphosis centered at the C5-C6 intervertebral level. Trace anterolisthesis of C4 onto C5. Trace retrolisthesis of C6 on C7. No evidence of dynamic instability is noted on  ?the flexion or extension views.  ? ?No prevertebral soft tissue swelling. Cervical vertebral body heights are maintained. Multilevel degenerative changes of the cervical spine including discogenic disease, most pronounced at C5-C6 and C6-C7, and facet arthropathy most pronounced at C4-C5. Multilevel mild to moderate left neural foraminal osseous narrowing of the mid to lower cervical vertebrae, possibly centrally/artifactual by patient positioning. ? ?#07/28/2020 bone marrow normocellular marrow with polytypic plasmacytosis, 3 to 8%   Patient is in remission.  Cytogenetics negative for trisomy 12.  Negative myeloma FISH panel. ? ? ?# Lumbar lesion,  ?11/13/20 [facility]  MRI cervical sine w/wo contrast - no evidence of metastatic disease C3-4 severe spinal stenosis, left neural foraminal narrowing. C4-5 spinal canal stenosis. ?[facility] MRI results showed no metastatic spine lesion. ?Chronic back pain and neck pain, images are consistent with chronic degenerative disease.  Patient continues to follow-up with [place] orthopedic surgeon. ? ?04/03/2021, light chain ratio increased to 3.76.   ?05/15/2021 bone marrow biopsy showed slightly hypercellular bone marrow for age with slight plasmacytosis.  5% plasma cells with kappa light chain restriction.  Cytogenetics normal.  Myeloma FISH negative ?# 05/15/2021 bone marrow biopsy showed slightly hypercellular bone marrow for age with slight plasmacytosis.  5% plasma cells with kappa light chain restriction.  Cytogenetics normal.  Myeloma FISH negative ? ?# 05/31/2021 Daratumumab -Pomalyst-dexamethasone-D1 patient had grade 2 infusion reaction to daratumumab and received steroids, Benadryl, famotidine.  He was able to finish treatment.D8  was able to finish Daratumumab with no infusion reactions. ? 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  ?Patient follows with podiatry Dr. Amalia Hailey for stress reaction fracture of the second metatarsal right.  He was cleared by Dr. Amalia Hailey to go back to [place].   ? ?08/16/21 Skeletal survey showed stable small right humeral and scapular lucencies.  Ill-defined small lucencies in the proximal right humeral shaft more conspicuous than on prior study of 08/10/2018. ?09/21/2021, PET scan showed no evidence of suspicious or new hypermetabolism, scattered small lucent foci seen previously in the sternum and the spine are less prominent today and have resolved in some regions.  Aortic atherosclerosis. ? ?Patient went to the emergency room on 11/18/2021 for neck pain evaluation.  11/18/2021, MRI cervical spine with and without contrast showed no multiple myeloma lesion or marrow edema identified in the cervical spine.  Abnormal dural thickening and a/or abnormal epidural space at C4/C5 levels.  This is nonspecific.  ER physician discussed the case with neurosurgery Dr. Cari Caraway who feels that this is more likely degenerative disc disease.  Recommend steroids.  Patient was given a tapering course of prednisone. ? ?11/20/2021, status post bone marrow biopsy which showed 1% of plasma cells. The plasma cells generally display polyclonal staining pattern for kappa and lambda light chains although a few very small clusters appear kappa light chain restricted.  The findings are very limited but most suggestive of minimal residual plasma cell neoplasm especially in the presence of monoclonal protein with kappa light chain specificity. Normal cytogenetics and myeloma FISH ? ?INTERVAL HISTORY ?Luis Burke is a 66  y.o. male who has above history reviewed by me today for follow-up multiple myeloma.  ?During the interval, patient has had cervical spine epidural steroid injection- Dr.Yarbrough ?Patient has also received IVIG 400 mg/kg x1 during  interval.  Tolerates well. ?Patient has no new complaints.  He reports feeling well today.  No fever, chills, nausea vomiting diarrhea. ? ? ?Review of Systems  ?Constitutional:  Negative for appetite ch

## 2022-01-17 NOTE — Patient Instructions (Signed)
[facility] AT [place]  Discharge Instructions: ?Thank you for choosing [facility] to provide your oncology and hematology care.  ?If you have a lab appointment with the [facility], please go directly to the [facility] and check in at the registration area. ? ?Wear comfortable clothing and clothing appropriate for easy access to any Portacath or PICC line.  ? ?We strive to give you quality time with your provider. You may need to reschedule your appointment if you arrive late (15 or more minutes).  Arriving late affects you and other patients whose appointments are after yours.  Also, if you miss three or more appointments without notifying the office, you may be dismissed from the clinic at the provider?s discretion.    ?  ?For prescription refill requests, have your pharmacy contact our office and allow 72 hours for refills to be completed.   ? ?Today you received the following chemotherapy and/or immunotherapy agents: Darzalex Shawn Route    ?  ?To help prevent nausea and vomiting after your treatment, we encourage you to take your nausea medication as directed. ? ?BELOW ARE SYMPTOMS THAT SHOULD BE REPORTED IMMEDIATELY: ?*FEVER GREATER THAN 100.4 F (38 ?C) OR HIGHER ?*CHILLS OR SWEATING ?*NAUSEA AND VOMITING THAT IS NOT CONTROLLED WITH YOUR NAUSEA MEDICATION ?*UNUSUAL SHORTNESS OF BREATH ?*UNUSUAL BRUISING OR BLEEDING ?*URINARY PROBLEMS (pain or burning when urinating, or frequent urination) ?*BOWEL PROBLEMS (unusual diarrhea, constipation, pain near the anus) ?TENDERNESS IN MOUTH AND THROAT WITH OR WITHOUT PRESENCE OF ULCERS (sore throat, sores in mouth, or a toothache) ?UNUSUAL RASH, SWELLING OR PAIN  ?UNUSUAL VAGINAL DISCHARGE OR ITCHING  ? ?Items with * indicate a potential emergency and should be followed up as soon as possible or go to the Emergency Department if any problems should occur. ? ?Please show the CHEMOTHERAPY ALERT CARD or IMMUNOTHERAPY ALERT CARD at  check-in to the Emergency Department and triage nurse. ? ?Should you have questions after your visit or need to cancel or reschedule your appointment, please contact [facility] [place] AT [place]  [phone] and follow the prompts.  Office hours are 8:00 a.m. to 4:30 p.m. Monday - Friday. Please note that voicemails left after 4:00 p.m. may not be returned until the following business day.  We are closed weekends and major holidays. You have access to a nurse at all times for urgent questions. Please call the main number to the clinic [phone] and follow the prompts. ? ?For any non-urgent questions, you may also contact your provider using MyChart. We now offer e-Visits for anyone 60 and older to request care online for non-urgent symptoms. For details visit [email or web address]. ?  ?Also download the MyChart app! Go to the app store, search "MyChart", open the app, select [facility], and log in with your MyChart username and password. ? ?Due to Covid, a mask is required upon entering the hospital/clinic. If you do not have a mask, one will be given to you upon arrival. For doctor visits, patients may have 1 support person aged 65 or older with them. For treatment visits, patients cannot have anyone with them due to current Covid guidelines and our immunocompromised population. Denosumab injection ?What is this medication? ?DENOSUMAB (den oh sue mab) slows bone breakdown. Prolia is used to treat osteoporosis in women after menopause and in men, and in people who are taking corticosteroids for 6 months or more. Delton See is used to treat a high calcium level due to cancer and to prevent bone  fractures and other bone problems caused by multiple myeloma or cancer bone metastases. Delton See is also used to treat giant cell tumor of the bone. ?This medicine may be used for other purposes; ask your health care provider or pharmacist if you have questions. ?COMMON BRAND NAME(S): Prolia, XGEVA ?What should  I tell my care team before I take this medication? ?They need to know if you have any of these conditions: ?dental disease ?having surgery or tooth extraction ?infection ?kidney disease ?low levels of calcium or Vitamin D in the blood ?malnutrition ?on hemodialysis ?skin conditions or sensitivity ?thyroid or parathyroid disease ?an unusual reaction to denosumab, other medicines, foods, dyes, or preservatives ?pregnant or trying to get pregnant ?breast-feeding ?How should I use this medication? ?This medicine is for injection under the skin. It is given by a health care professional in a hospital or clinic setting. ?A special MedGuide will be given to you before each treatment. Be sure to read this information carefully each time. ?For Prolia, talk to your pediatrician regarding the use of this medicine in children. Special care may be needed. For Delton See, talk to your pediatrician regarding the use of this medicine in children. While this drug may be prescribed for children as young as 13 years for selected conditions, precautions do apply. ?Overdosage: If you think you have taken too much of this medicine contact a poison control center or emergency room at once. ?NOTE: This medicine is only for you. Do not share this medicine with others. ?What if I miss a dose? ?It is important not to miss your dose. Call your doctor or health care professional if you are unable to keep an appointment. ?What may interact with this medication? ?Do not take this medicine with any of the following medications: ?other medicines containing denosumab ?This medicine may also interact with the following medications: ?medicines that lower your chance of fighting infection ?steroid medicines like prednisone or cortisone ?This list may not describe all possible interactions. Give your health care provider a list of all the medicines, herbs, non-prescription drugs, or dietary supplements you use. Also tell them if you smoke, drink alcohol, or  use illegal drugs. Some items may interact with your medicine. ?What should I watch for while using this medication? ?Visit your doctor or health care professional for regular checks on your progress. Your doctor or health care professional may order blood tests and other tests to see how you are doing. ?Call your doctor or health care professional for advice if you get a fever, chills or sore throat, or other symptoms of a cold or flu. Do not treat yourself. This drug may decrease your body's ability to fight infection. Try to avoid being around people who are sick. ?You should make sure you get enough calcium and vitamin D while you are taking this medicine, unless your doctor tells you not to. Discuss the foods you eat and the vitamins you take with your health care professional. ?See your dentist regularly. Brush and floss your teeth as directed. Before you have any dental work done, tell your dentist you are receiving this medicine. ?Do not become pregnant while taking this medicine or for 5 months after stopping it. Talk with your doctor or health care professional about your birth control options while taking this medicine. Women should inform their doctor if they wish to become pregnant or think they might be pregnant. There is a potential for serious side effects to an unborn child. Talk to your health care professional  or pharmacist for more information. ?What side effects may I notice from receiving this medication? ?Side effects that you should report to your doctor or health care professional as soon as possible: ?allergic reactions like skin rash, itching or hives, swelling of the face, lips, or tongue ?bone pain ?breathing problems ?dizziness ?jaw pain, especially after dental work ?redness, blistering, peeling of the skin ?signs and symptoms of infection like fever or chills; cough; sore throat; pain or trouble passing urine ?signs of low calcium like fast heartbeat, muscle cramps or muscle pain;  pain, tingling, numbness in the hands or feet; seizures ?unusual bleeding or bruising ?unusually weak or tired ?Side effects that usually do not require medical attention (report to your doctor or healt

## 2022-01-18 LAB — KAPPA/LAMBDA LIGHT CHAINS
Kappa free light chain: 10.5 mg/L (ref 3.3–19.4)
Kappa, lambda light chain ratio: 1.48 (ref 0.26–1.65)
Lambda free light chains: 7.1 mg/L (ref 5.7–26.3)

## 2022-01-21 LAB — MULTIPLE MYELOMA PANEL, SERUM
Albumin SerPl Elph-Mcnc: 4 g/dL (ref 2.9–4.4)
Albumin/Glob SerPl: 1.3 (ref 0.7–1.7)
Alpha 1: 0.3 g/dL (ref 0.0–0.4)
Alpha2 Glob SerPl Elph-Mcnc: 0.7 g/dL (ref 0.4–1.0)
B-Globulin SerPl Elph-Mcnc: 1 g/dL (ref 0.7–1.3)
Gamma Glob SerPl Elph-Mcnc: 1.2 g/dL (ref 0.4–1.8)
Globulin, Total: 3.2 g/dL (ref 2.2–3.9)
IgA: 108 mg/dL (ref 61–437)
IgG (Immunoglobin G), Serum: 1151 mg/dL (ref 603–1613)
IgM (Immunoglobulin M), Srm: 8 mg/dL — ABNORMAL LOW (ref 20–172)
Total Protein ELP: 7.2 g/dL (ref 6.0–8.5)

## 2022-01-22 ENCOUNTER — Ambulatory Visit: Payer: Medicare Other | Attending: Neurosurgery

## 2022-01-22 DIAGNOSIS — M542 Cervicalgia: Secondary | ICD-10-CM | POA: Insufficient documentation

## 2022-01-22 DIAGNOSIS — M5412 Radiculopathy, cervical region: Secondary | ICD-10-CM | POA: Diagnosis present

## 2022-01-22 DIAGNOSIS — M6281 Muscle weakness (generalized): Secondary | ICD-10-CM | POA: Diagnosis present

## 2022-01-22 NOTE — Therapy (Signed)
?[facility] ?[address] ?[place], [place], [address] ?Phone: [phone]   Fax:  [phone] ? ?Physical Therapy Treatment ? ?Patient Details  ?Name: Luis Burke ?MRN: [id number] ?Date of Birth: 03/08/1956 ?No data recorded ? ?Encounter Date: 01/22/2022 ? ? PT End of Session - 01/22/22 0921   ? ? Visit Number 8   ? Number of Visits 17   ? Date for PT Re-Evaluation 02/11/22   ? Authorization Type Medicare primary, Aet[employer] secondary   ? Authorization Time Period 12/17/21-02/11/22   ? Progress Note Due on Visit 10   ? PT Start Time [phone]   ? PT Stop Time 0954   ? PT Time Calculation (min) 40 min   ? Equipment Utilized During Treatment Gait belt   ? Activity Tolerance Patient tolerated treatment well;No increased pain   ? Behavior During Therapy [facility] for tasks assessed/performed   ? ?  ?  ? ?  ? ? ?Past Medical History:  ?Diagnosis Date  ? AKI (acute kidney injury) ([place]) 01/06/2019  ? Anemia   ? 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  ? Fatty liver   ? on u/s 07/2018  ? Hyperlipidemia 03/2004  ? Hypertension 1994  ? Multiple myeloma ([place])   ? Snores   ? ? ?Past Surgical History:  ?Procedure Laterality Date  ? CATARACT EXTRACTION Left 10/10/2021  ? CATARACT EXTRACTION W/PHACO Right 03/04/2018  ? Procedure: CATARACT EXTRACTION PHACO AND INTRAOCULAR LENS PLACEMENT ([place])  RIGHT TORIC;  Surgeon: Leandrew Koyanagi, MD;  Location: [facility];  Service: Ophthalmology;  Laterality: Right;  [place] no Toric Lens 1:45 5.3  ? CATARACT EXTRACTION W/PHACO Left 10/10/2021  ? Procedure: CATARACT EXTRACTION PHACO AND INTRAOCULAR LENS PLACEMENT ([place]) LEFT;  Surgeon: Leandrew Koyanagi, MD;  Location: [facility];  Service: Ophthalmology;  Laterality: Left;  5.16 ?1:03.8  ? COLONOSCOPY WITH PROPOFOL N/A 07/20/2018  ? Procedure: COLONOSCOPY WITH PROPOFOL;  Surgeon: Jonathon Bellows, MD;  Location: [facility]  ENDOSCOPY;  Service: Gastroenterology;  Laterality: N/A;  ? ESOPHAGOGASTRODUODENOSCOPY (EGD) WITH PROPOFOL N/A 07/20/2018  ? Procedure: ESOPHAGOGASTRODUODENOSCOPY (EGD) WITH PROPOFOL;  Surgeon: Jonathon Bellows, MD;  Location: [facility] ENDOSCOPY;  Service: Gastroenterology;  Laterality: N/A;  ? EYE SURGERY Right   ? HERNIA REPAIR    ? L Lap herniorraphy  04/2000  ? Left wrist ganglionectomy    ? ? ?There were no vitals filed for this visit. ? ? Subjective Assessment - 01/22/22 0918   ? ? Subjective Pt doing well today, reports his IVIG treatment and other infusion went well, his numbers have come back looking pleasing to his eyes. His upper cervicla injection went well with some benefit in pain, asked to refrain from PT for ~ 1 week per patient now passed. Left shoulder has conitnued to feel 2/10 pain under the shoulder blade.   ? Pertinent History Pt arrives to PT with radiating pain from cervical spine into left upper extremity. Pt is familiar to this clinic for similar symptoms. He states radiating pain typically extends into the upper trap. Recently on one occasion, pain radiated full length of arm; pt went to ER due to significant pain and concern of stroke. Stroke ruled out, pt referred to PT for new significant weakness within [place] joint and is unable to lift his arm above his head in frontal and sagittal planes. At worst, pain is 8/10, best 0/10. He has been told  he has mets in the left shoulder region. Patient is receiving maintenance chemo for multiple myeloma 1x/month with next treatment scheduled for this Thursday. Patient states that pain improved significantly following last bout of PT. He states he continued his HEP for a little while but stopped and that is when the pain returned. Pt has a past medical history of bone marrow transplant; he is followed by [facility] oncology Dr. Tasia Catchings. Other PMH includes HTN, hyperlipidemia, lumbar stenosis, and anemia. Pt denies any unexplained weight fluctuation, saddle  paresthesia, loss of bowel/bladder function, or unrelenting night pain at this time.   ? Currently in Pain? Yes   ? Pain Score --   2/10 left shoulderbalde  ? ?  ?  ? ?  ? ? ? ?INTERVENTION: ?-AA/ROM BUE on UBE x5 minutes, level 3, alternating direction Q60s ?-seated C0/C1 stretch 2x30sec, followed by nod mobiliation 2x20bilat ? ?MFR to Left subscapularis, followed by ART release with shoulder flexion x5 minutes, reduced pain thereafter ?*positive tenderness to palpation of subscapularis tendon attachment ? ?-Supine LEFT GHJ IR in 90 degrees abduction 3lb FW 3x10 90sec recovery between ? ?-seated cable row 1x12 @ 20lb ?-seated chest press 1x12 @ 20lb (new) ?-LUE overhead reach with min-modA 1x15 ? ?-seated cable row 1x12 @ 22lb (increased)  ?-seated chest press 1x12 @ 20lb  ?-LUE overhead reach with min-modA 1x15 ?--------------------------------------------------------- ? ?INTERVENTION 3/28: ?- Arm ergometer warm-up, level 4 for 5 minutes, alternating direction Q60sec  ?-seated Left suboccipital stretch 2x30sec, then capital nod mobilization in left and right ?  ?-standing cable row 1x12 @ 20lb ?-LUE overhead reach (short level) 1x12 c modA from author after 4 ?-Standing pushup on treadmill bar 1x15  ?-LUE elbow flexion 1x12 @ 6lb  ?  ?-standing cable row 1x12 @ 20lb ?-LUE overhead reach (short level) 1x12 c modA from author after 4 ?-Standing pushup on treadmill bar 1x15  ?-LUE elbow flexion 1x12 @ 6lb  ?  ?-Neck scarf triceps extension BTB 1x12  ?-seated LUE ER, elbow supported 2lb FW 1x10 ?-standing LUE shoulder ABDCT to 90 1x10 (modA after 5)  ?  ?-Neck scarf triceps extension BTB 1x12  ?-seated LUE ER, elbow supported 2lb FW 1x10 ?-standing LUE shoulder ABDCT to 90 1x10 (modA after 5)  ? ?------------------------------------------------------------------ ?INTERVENTION 3/23: ?  ?- Arm ergometer warm-up, level 4 for 5 minutes (2.5 forward, 2.5 backward)  ?-seated Left suboccipital stretch 2x30sec, then capitla  nod mobilization in left and right ?-LUE overhead reach (short level) 1x15 c modA from author  ?-standing cable row 1x15 @ 15lb ?-LUE overhead reach (short level) 1x15 c modA from author  ?-standing cable row 1x15 @ 15lb ?*Seated LUE overhead reach with pulleys 1x20 (trial for home)  ?  ?-LUE shoulder ABDCT to 90 degrees 1x10 c minA  ?Recovery break  ?-LUE flexion 0-90 1x8 (fatigues quickly)  ?-LUE elbow flexion 1x12 @ 6lb  ?-wall pushups standing 1x12 ? ? ? ? ? ? PT Education - 01/22/22 0921   ? ? Education Details Conitnued with soft tissue work and shoulder strengthening   ? Person(s) Educated Patient   ? Methods Explanation   ? Comprehension Verbalized understanding   ? ?  ?  ? ?  ? ? ? PT Short Term Goals - 12/17/21 1759   ? ?  ? PT SHORT TERM GOAL #1  ? Title Pt will demonstrate independence with HEP to improve cervical function for increased ability to participate with ADLs   ? Baseline 3/6: HEP given   ?  Time 4   ? Period Weeks   ? Status New   ? Target Date 01/14/22   ? ?  ?  ? ?  ? ? ? ? PT Long Term Goals - 12/17/21 1800   ? ?  ? PT LONG TERM GOAL #1  ? Title Patient will increase FOTO score to equal to or greater than 64 to demonstrate statistically significant improvement in mobility and quality of life.   ? Baseline 3/6: 48   ? Time 8   ? Period Weeks   ? Status New   ? Target Date 02/11/22   ?  ? PT LONG TERM GOAL #2  ? Title Pt will decrease worst cervical pain as reported on NPRS by at least 2 points in order to demonstrate clinically significant reduction in neck pain.   ? Baseline 3/6: 8/10   ? Time 8   ? Period Weeks   ? Status New   ? Target Date 02/11/22   ?  ? PT LONG TERM GOAL #3  ? Title Pt will improve active cervical lateral flexion >40* and extension >60* for improved muscle length ad completion of functional tasks at home.   ? Baseline 3/6: R/L 40* / 20*, E 35*   ? Time 8   ? Period Weeks   ? Status New   ? Target Date 02/11/22   ?  ? PT LONG TERM GOAL #4  ? Title Pt will increase  strength of by at least 1/2 MMT grade throughout entire LUE in order to demonstrate improvement in strength and function.   ? Baseline 3/6: between 2 to 4/5 - please see eval   ? Time 8   ? Period Weeks   ? Status New   ?

## 2022-01-24 ENCOUNTER — Ambulatory Visit: Payer: Medicare Other

## 2022-01-24 DIAGNOSIS — M5412 Radiculopathy, cervical region: Secondary | ICD-10-CM

## 2022-01-24 DIAGNOSIS — M6281 Muscle weakness (generalized): Secondary | ICD-10-CM

## 2022-01-24 DIAGNOSIS — M542 Cervicalgia: Secondary | ICD-10-CM

## 2022-01-24 NOTE — Therapy (Signed)
[facility] [facility] ?[address] ?[place], [place], [address] ?Phone: [phone]   Fax:  [phone] ? ?Physical Therapy Treatment/Reassessment ? ?Patient Details  ?Name: Luis Burke ?MRN: [id number] ?Date of Birth: Oct 28, 1955 ?No data recorded ? ?Encounter Date: 01/24/2022 ? ? PT End of Session - 01/24/22 0941   ? ? Visit Number 9   ? Number of Visits 17   ? Date for PT Re-Evaluation 02/11/22   ? Authorization Type Medicare primary, Aetna secondary   ? Authorization Time Period 12/17/21-02/11/22   ? Progress Note Due on Visit 10   ? PT Start Time 4174   ? PT Stop Time 0955   ? PT Time Calculation (min) 40 min   ? Equipment Utilized During Treatment --   ? Activity Tolerance Patient tolerated treatment well;No increased pain   ? Behavior During Therapy [facility] for tasks assessed/performed   ? ?  ?  ? ?  ? ? ?Past Medical History:  ?Diagnosis Date  ? AKI (acute kidney injury) ([place]) 01/06/2019  ? Anemia   ? 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  ? Fatty liver   ? on u/s 07/2018  ? Hyperlipidemia 03/2004  ? Hypertension 1994  ? Multiple myeloma ([place])   ? Snores   ? ? ?Past Surgical History:  ?Procedure Laterality Date  ? CATARACT EXTRACTION Left 10/10/2021  ? CATARACT EXTRACTION W/PHACO Right 03/04/2018  ? Procedure: CATARACT EXTRACTION PHACO AND INTRAOCULAR LENS PLACEMENT ([place])  RIGHT TORIC;  Surgeon: Leandrew Koyanagi, MD;  Location: [facility];  Service: Ophthalmology;  Laterality: Right;  Per [place] no Toric Lens 1:45 5.3  ? CATARACT EXTRACTION W/PHACO Left 10/10/2021  ? Procedure: CATARACT EXTRACTION PHACO AND INTRAOCULAR LENS PLACEMENT ([place]) LEFT;  Surgeon: Leandrew Koyanagi, MD;  Location: [facility];  Service: Ophthalmology;  Laterality: Left;  5.16 ?1:03.8  ? COLONOSCOPY WITH PROPOFOL N/A 07/20/2018  ? Procedure: COLONOSCOPY WITH PROPOFOL;  Surgeon: Jonathon Bellows, MD;  Location:  [facility] ENDOSCOPY;  Service: Gastroenterology;  Laterality: N/A;  ? ESOPHAGOGASTRODUODENOSCOPY (EGD) WITH PROPOFOL N/A 07/20/2018  ? Procedure: ESOPHAGOGASTRODUODENOSCOPY (EGD) WITH PROPOFOL;  Surgeon: Jonathon Bellows, MD;  Location: [facility] ENDOSCOPY;  Service: Gastroenterology;  Laterality: N/A;  ? EYE SURGERY Right   ? HERNIA REPAIR    ? L Lap herniorraphy  04/2000  ? Left wrist ganglionectomy    ? ? ?There were no vitals filed for this visit. ? ? Subjective Assessment - 01/24/22 0920   ? ? Subjective Pt feelign good today. He reports soft tissue work targeted at Left subscapularis last time has continued to bring excellent pain relief. Minimal pain in neck to shoulder blade last night at bed time.   ? Pertinent History Pt arrives to PT with radiating pain from cervical spine into left upper extremity. Pt is familiar to this clinic for similar symptoms. He states radiating pain typically extends into the upper trap. Recently on one occasion, pain radiated full length of arm; pt went to ER due to significant pain and concern of stroke. Stroke ruled out, pt referred to PT for new significant weakness within [place] joint and is unable to lift his arm above his head in frontal and sagittal planes. At worst, pain is 8/10, best 0/10. 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 Patient is receiving maintenance chemo for multiple myeloma 1x/month with next treatment scheduled for this Thursday. Patient states  that pain improved significantly following last bout of PT. He states he continued his HEP for a little while but stopped and that is when the pain returned. Pt has a past medical history of bone marrow transplant; he is followed by [facility] oncology Dr. Tasia Catchings. Other PMH includes HTN, hyperlipidemia, lumbar stenosis, and anemia. Pt denies any unexplained weight fluctuation, saddle paresthesia, loss of bowel/bladder function, or unrelenting night pain at this time.   ? Diagnostic tests MRI   ? Patient Stated Goals take  some of the soreness out of left neck/shoulder   ? Currently in Pain? No/denies   ? ?  ?  ? ?  ? ?Tests and Measures:  ? ?FOTO Survey: 61 (48 at evaluation)  ? ?Cervical ROM 01/24/22 Right  Left  ?Cervical Rotation (global) 58 (52 at eval) 74 (55 at eval)  ?Capital Rotation    ?Cervical Lateral Flexion  31 (40 at eval) 19 (20 at eval)  ?Cervical Extension 45 degrees (35 at eval)  ?Cervical Flexion   ?Seated thoracic rotation    ?Seated thoracic extension   ?Seated thoracic flexion   ? ? ? ?Shoulder Strength Assessment    ? Right 01/24/22 Left 01/24/22 Left 12/17/21  ?Upper Trapezius      ?Shoulder Flexion 5/5 4/5 4-/5  ?Shoulder ABDCT 5/5 4/5 2/5  ?Shoulder ER  5/5 4+/5 3/5  ?Shoulder IR 5/5 5/5 4/5  ?Shoulder Extension 5/5 5/5 4/5  ?Horizontal ABDCT 5/5 5/5 4+/5  ?Elbow Flexion 5/5 5/5 4/5  ?Elbow Extension  5/5 5/5 4/5  ?     ?     ?  ?--------------------------------------------------------------- ? ?Intervention this date:  ?-AA/ROM BUE on UBE x5 minutes, level 3, alternating direction Q60s ? ?-seated cable row 1x12 @ 20lb  ?-seated chest press 1x12 @ 20lb ?-standing LUE overhead reach 1x15 c minA ?-standing BUE ER supinated GTB 1x15  ? ? ?-seated cable row 1x12 @ 23lb (increased today)  ?-seated chest press 1x12 @ 20lb ?-standing LUE overhead reach 1x15 c minA ?-standing BUE ER supinated GTB 1x15  ? ?-------------------------------------- ?INTERVENTION 01/22/22 ?-AA/ROM BUE on UBE x5 minutes, level 3, alternating direction Q60s ?-seated C0/C1 stretch 2x30sec, followed by nod mobiliation 2x20bilat ?  ?MFR to Left subscapularis, followed by ART release with shoulder flexion x5 minutes, reduced pain thereafter ?*positive tenderness to palpation of subscapularis tendon attachment ?  ?-Supine LEFT GHJ IR in 90 degrees abduction 3lb FW 3x10 90sec recovery between ?  ?-seated cable row 1x12 @ 20lb ?-seated chest press 1x12 @ 20lb (new) ?-LUE overhead reach with min-modA 1x15 ?  ?-seated cable row 1x12 @ 22lb (increased)   ?-seated chest press 1x12 @ 20lb  ?-LUE overhead reach with min-modA 1x15 ?--------------------------------------------------------- ?  ?INTERVENTION 3/28: ?- Arm ergometer warm-up, level 4 for 5 minutes, alternating direction Q60sec  ?-seated Left suboccipital stretch 2x30sec, then capital nod mobilization in left and right ?  ?-standing cable row 1x12 @ 20lb ?-LUE overhead reach (short level) 1x12 c modA from author after 4 ?-Standing pushup on treadmill bar 1x15  ?-LUE elbow flexion 1x12 @ 6lb  ?  ?-standing cable row 1x12 @ 20lb ?-LUE overhead reach (short level) 1x12 c modA from author after 4 ?-Standing pushup on treadmill bar 1x15  ?-LUE elbow flexion 1x12 @ 6lb  ?  ?-Neck scarf triceps extension BTB 1x12  ?-seated LUE ER, elbow supported 2lb FW 1x10 ?-standing LUE shoulder ABDCT to 90 1x10 (modA after 5)  ?  ?-Neck scarf triceps extension BTB 1x12  ?-  seated LUE ER, elbow supported 2lb FW 1x10 ?-standing LUE shoulder ABDCT to 90 1x10 (modA after 5)  ? ? ? ? ? ? PT Education - 01/24/22 0942   ? ? Education Details results of reassessment this date   ? Person(s) Educated Patient   ? Methods Explanation;Demonstration   ? Comprehension Verbalized understanding   ? ?  ?  ? ?  ? ? ? PT Short Term Goals - 12/17/21 1759   ? ?  ? PT SHORT TERM GOAL #1  ? Title Pt will demonstrate independence with HEP to improve cervical function for increased ability to participate with ADLs   ? Baseline 3/6: HEP given   ? Time 4   ? Period Weeks   ? Status New   ? Target Date 01/14/22   ? ?  ?  ? ?  ? ? ? ? PT Long Term Goals - 01/24/22 0923   ? ?  ? PT LONG TERM GOAL #1  ? Title Patient will increase FOTO score to equal to or greater than 64 to demonstrate statistically significant improvement in mobility and quality of life.   ? Baseline 3/6: 48; 4/13: 61   ? Time 8   ? Period Weeks   ? Status On-going   ? Target Date 02/11/22   ?  ? PT LONG TERM GOAL #2  ? Title Pt will decrease worst cervical pain as reported on NPRS by at  least 2 points in order to demonstrate clinically significant reduction in neck pain.   ? Baseline 3/6: 8/10; 01/24/22: 4-5/10   ? Time 8   ? Period Weeks   ? Status Achieved   ? Target Date 02/11/22   ?  ? PT

## 2022-01-29 ENCOUNTER — Ambulatory Visit: Payer: Medicare Other | Admitting: Physical Therapy

## 2022-01-29 NOTE — Patient Instructions (Incomplete)
Progress Note (re-eval last date) ? ?Intervention this date:  ?-AA/ROM BUE on UBE x5 minutes, level 3, alternating direction Q60s ?  ?-seated cable row 1x12 @ 20lb  ?-seated chest press 1x12 @ 20lb ?-standing LUE overhead reach 1x15 c minA ?-standing BUE ER supinated GTB 1x15  ?  ?  ?-seated cable row 1x12 @ 23lb (increased today)  ?-seated chest press 1x12 @ 20lb ?-standing LUE overhead reach 1x15 c minA ?-standing BUE ER supinated GTB 1x15  ?  ?-------------------------------------- ?INTERVENTION 01/22/22 ?-AA/ROM BUE on UBE x5 minutes, level 3, alternating direction Q60s ?-seated C0/C1 stretch 2x30sec, followed by nod mobiliation 2x20bilat ?  ?MFR to Left subscapularis, followed by ART release with shoulder flexion x5 minutes, reduced pain thereafter ?*positive tenderness to palpation of subscapularis tendon attachment ?  ?-Supine LEFT GHJ IR in 90 degrees abduction 3lb FW 3x10 90sec recovery between ?

## 2022-02-07 ENCOUNTER — Ambulatory Visit: Payer: Medicare Other | Admitting: Physical Therapy

## 2022-02-07 ENCOUNTER — Encounter: Payer: Self-pay | Admitting: Physical Therapy

## 2022-02-07 DIAGNOSIS — M542 Cervicalgia: Secondary | ICD-10-CM | POA: Diagnosis not present

## 2022-02-07 DIAGNOSIS — M5412 Radiculopathy, cervical region: Secondary | ICD-10-CM

## 2022-02-07 DIAGNOSIS — M6281 Muscle weakness (generalized): Secondary | ICD-10-CM

## 2022-02-07 NOTE — Therapy (Signed)
[facility] ?[address] ?[place], [place], [address] ?Phone: [phone]   Fax:  [phone] ? ?Physical Therapy Treatment/Physical Therapy Progress Note/Re-certification Note ? ? ?Dates of reporting period  12/17/21   to   02/07/22 ? ? ?Patient Details  ?Name: Luis Burke ?MRN: [id number] ?Date of Birth: 11-21-1955 ?No data recorded ? ?Encounter Date: 02/07/2022 ? ? PT End of Session - 02/07/22 1194   ? ? Visit Number 10   ? Number of Visits 22   ? Date for PT Re-Evaluation 03/21/22   ? Authorization Type Medicare primary, [employer] secondary   ? Authorization Time Period 12/17/21-02/11/22   ? Progress Note Due on Visit 10   ? PT Start Time 1740   ? PT Stop Time 1000   ? PT Time Calculation (min) 45 min   ? Activity Tolerance Patient tolerated treatment well;No increased pain   ? Behavior During Therapy [facility] for tasks assessed/performed   ? ?  ?  ? ?  ? ? ?Past Medical History:  ?Diagnosis Date  ? AKI (acute kidney injury) ([place]) 01/06/2019  ? Anemia   ? 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  ? Fatty liver   ? on u/s 07/2018  ? Hyperlipidemia 03/2004  ? Hypertension 1994  ? Multiple myeloma ([place])   ? Snores   ? ? ?Past Surgical History:  ?Procedure Laterality Date  ? CATARACT EXTRACTION Left 10/10/2021  ? CATARACT EXTRACTION W/PHACO Right 03/04/2018  ? Procedure: CATARACT EXTRACTION PHACO AND INTRAOCULAR LENS PLACEMENT ([place])  RIGHT TORIC;  Surgeon: Leandrew Koyanagi, MD;  Location: [facility];  Service: Ophthalmology;  Laterality: Right;  Per [place] no Toric Lens 1:45 5.3  ? CATARACT EXTRACTION W/PHACO Left 10/10/2021  ? Procedure: CATARACT EXTRACTION PHACO AND INTRAOCULAR LENS PLACEMENT ([place]) LEFT;  Surgeon: Leandrew Koyanagi, MD;  Location: [facility];  Service: Ophthalmology;  Laterality: Left;  5.16 ?1:03.8  ? COLONOSCOPY WITH PROPOFOL N/A 07/20/2018  ? Procedure: COLONOSCOPY  WITH PROPOFOL;  Surgeon: Jonathon Bellows, MD;  Location: [facility] ENDOSCOPY;  Service: Gastroenterology;  Laterality: N/A;  ? ESOPHAGOGASTRODUODENOSCOPY (EGD) WITH PROPOFOL N/A 07/20/2018  ? Procedure: ESOPHAGOGASTRODUODENOSCOPY (EGD) WITH PROPOFOL;  Surgeon: Jonathon Bellows, MD;  Location: [facility] ENDOSCOPY;  Service: Gastroenterology;  Laterality: N/A;  ? EYE SURGERY Right   ? HERNIA REPAIR    ? L Lap herniorraphy  04/2000  ? Left wrist ganglionectomy    ? ? ?There were no vitals filed for this visit. ? ? Subjective Assessment - 02/07/22 0917   ? ? Subjective Pt feeling good today. Denies pain. States his strength has greatly improved since last seen by this Chief Strategy Officer.   ? Pertinent History Pt arrives to PT with radiating pain from cervical spine into left upper extremity. Pt is familiar to this clinic for similar symptoms. He states radiating pain typically extends into the upper trap. Recently on one occasion, pain radiated full length of arm; pt went to ER due to significant pain and concern of stroke. Stroke ruled out, pt referred to PT for new significant weakness within [place] joint and is unable to lift his arm above his head in frontal and sagittal planes. At worst, pain is 8/10, best 0/10. 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 Patient is receiving maintenance chemo for multiple myeloma 1x/month with next treatment scheduled for this Thursday. Patient states that pain improved significantly following  last bout of PT. He states he continued his HEP for a little while but stopped and that is when the pain returned. Pt has a past medical history of bone marrow transplant; he is followed by [facility] oncology Dr. Tasia Catchings. Other PMH includes HTN, hyperlipidemia, lumbar stenosis, and anemia. Pt denies any unexplained weight fluctuation, saddle paresthesia, loss of bowel/bladder function, or unrelenting night pain at this time.   ? Diagnostic tests MRI   ? Patient Stated Goals take some of the soreness out of left  neck/shoulder   ? Currently in Pain? No/denies   ? ?  ?  ? ?  ? ? ? ? ? ? ?PROGRESS NOTE and RE-CERTIFICATION - please see note from 01/24/22 for objective measures.  ? ? ? ?INTERVENTION: ? ?-Arm ergometer x5 minutes, level 3, alternating direction Q60s ?  ?-seated cable row 3x12 @ 25#  ?-seated chest press 3x12 @ 35# ?-cable lat pulldown 3x12 @ 45# ? ?-cable SA row LUE 3x12 @ 15# ?-RedTB ER LUE 3x12 ?-RedTB IR LUE 3x12 ? ?-LUE overhead reach wall slide, forward flexion, 3x12 ?-LUE shoulder abduction, 3x12 ?-wall push up, left arm bias to challenge strength, 3x12 ?  ? ? ? ?Clinical Impression: Pt is pleasant and motivated throughout session. He arrives with no pain; session focused on strength training. Progressed resistance of cable rows and chest press. Added single arm exercises to address focal weakness in LUE. Pt fatigued at end of session however no pain. LUE strength has made significant progress; it remains weaker than right and would benefit from continued skilled PT services to address deficits in strength and AROM to allow for allow for completion of household tasks and hobbies for improved QOL. ? ?Patient's condition has the potential to improve in response to therapy. Maximum improvement is yet to be obtained. The anticipated improvement is attainable and reasonable in a generally predictable time.  ? ? ? ? ? ? ? ? PT Short Term Goals - 02/07/22 1127   ? ?  ? PT SHORT TERM GOAL #1  ? Title Pt will demonstrate independence with HEP to improve cervical function for increased ability to participate with ADLs   ? Baseline 3/6: HEP given   ? Time 4   ? Period Weeks   ? Status Achieved   ? Target Date 01/14/22   ? ?  ?  ? ?  ? ? ? ? PT Long Term Goals - 02/07/22 1127   ? ?  ? PT LONG TERM GOAL #1  ? Title Patient will increase FOTO score to equal to or greater than 64 to demonstrate statistically significant improvement in mobility and quality of life.   ? Baseline 3/6: 48; 4/13: 61   ? Time 8   ? Period Weeks    ? Status On-going   ? Target Date 03/21/22   ?  ? PT LONG TERM GOAL #2  ? Title Pt will decrease worst cervical pain as reported on NPRS by at least 2 points in order to demonstrate clinically significant reduction in neck pain.   ? Baseline 3/6: 8/10; 01/24/22: 4-5/10   ? Time 8   ? Period Weeks   ? Status Achieved   ? Target Date 02/11/22   ?  ? PT LONG TERM GOAL #3  ? Title Pt will improve active cervical lateral flexion >40* and extension >60* for improved muscle length ad completion of functional tasks at home.   ? Baseline 3/6: R/L 40*/20*, E 35*; 01/24/22: extension  45 degrees, SB: 31 Rt, 19 Lt   ? Time 8   ? Period Weeks   ? Status Deferred   ? Target Date 02/11/22   ?  ? PT LONG TERM GOAL #4  ? Title Pt will increase strength by at least 1/2 MMT grade throughout entire LUE in order to demonstrate improvement in strength and function.   ? Baseline 3/6: between 2 to 4/5 - please see eval; 02/07/22: shoulder flexion, abduction and ER 4/5   ? Time 8   ? Period Weeks   ? Status On-going   ? Target Date 03/21/22   ? ?  ?  ? ?  ? ? ? ? ? ? ? ? Plan - 02/07/22 1126   ? ? Clinical Impression Statement Pt is pleasant and motivated throughout session. He arrives with no pain; session focused on strength training. Progressed resistance of cable rows and chest press. Added single arm exercises to address focal weakness in LUE. Pt fatigued at end of session however no pain. LUE strength has made significant progress; it remains weaker than right and would benefit from continued skilled PT services to address deficits in strength and AROM to allow for allow for completion of household tasks and hobbies for improved QOL.   ? Personal Factors and Comorbidities Comorbidity 1   ? Comorbidities Multiple myeloma, chemotherapy, HTN, anemia   ? Examination-Activity Limitations Lift;Reach Overhead;Carry   ? Examination-Participation Restrictions Driving;Cleaning;Community Activity   ? Stability/Clinical Decision Making  Evolving/Moderate complexity   ? Rehab Potential Good   ? PT Frequency 2x / week   ? PT Duration 8 weeks   ? PT Treatment/Interventions ADLs/Self Care Home Management;Cryotherapy;Traction;Therapeutic exercise;Therapeut

## 2022-02-11 ENCOUNTER — Telehealth: Payer: Self-pay | Admitting: *Deleted

## 2022-02-11 ENCOUNTER — Ambulatory Visit (INDEPENDENT_AMBULATORY_CARE_PROVIDER_SITE_OTHER): Payer: Medicare Other | Admitting: Nurse Practitioner

## 2022-02-11 ENCOUNTER — Encounter: Payer: Self-pay | Admitting: Nurse Practitioner

## 2022-02-11 VITALS — BP 176/94 | HR 67 | Temp 97.6°F | Resp 16 | Ht 70.0 in | Wt 210.4 lb

## 2022-02-11 DIAGNOSIS — R051 Acute cough: Secondary | ICD-10-CM | POA: Diagnosis not present

## 2022-02-11 DIAGNOSIS — J029 Acute pharyngitis, unspecified: Secondary | ICD-10-CM | POA: Insufficient documentation

## 2022-02-11 DIAGNOSIS — J020 Streptococcal pharyngitis: Secondary | ICD-10-CM | POA: Insufficient documentation

## 2022-02-11 LAB — POC COVID19 BINAXNOW: SARS Coronavirus 2 Ag: NEGATIVE

## 2022-02-11 LAB — POCT RAPID STREP A (OFFICE): Rapid Strep A Screen: POSITIVE — AB

## 2022-02-11 MED ORDER — AMOXICILLIN 500 MG PO CAPS: 500.0000 mg | ORAL_CAPSULE | Freq: Two times a day (BID) | ORAL | 0 refills | Status: AC

## 2022-02-11 NOTE — Assessment & Plan Note (Signed)
Patient strep test positive in office.  We will treat patient with amoxicillin 500 mg twice daily for 10 days.  Did ask patient to call oncology group to make sure he is still okay to go to treatment on Thursday even though he will be on antibiotics but around other immune compromised patients.  Patient knowledge understood.  Follow-up if no improvement ?

## 2022-02-11 NOTE — Telephone Encounter (Signed)
Patient called reporting that he has a cough, nasal congestion and sore throat that started this weekend. He denies fever and states he feel he has a cold. He made an appointment with PCP clinic today and was asking if he should keep that appointment as he does not want to do anything that will prevent him form having treatment Thursday.  I advised he keep appointment with PCP and let us know what is said. He is in agreement with this plan ?

## 2022-02-11 NOTE — Telephone Encounter (Signed)
Please postpone appts for 2 weeks. Please inform pt of new appt details.  ?

## 2022-02-11 NOTE — Progress Notes (Signed)
? ?Acute Office Visit ? ?Subjective:  ? ?  ?Patient ID: Luis Burke, male    DOB: September 25, 1956, 66 y.o.   MRN: [id number] ? ?Chief Complaint  ?Patient presents with  ? URI  ?  Started on 01/19/22-throat was "on fire" night of 02/08/22-used lozenges and this went away, cough-took OTC HBP type of medication, runny nose. No headache or fever.  ? ? ? ? ? ?Patient is in today for URI ? ?Symptoms started on 02/08/2022 with a sore throat.  Patient has used salt water gargles and throat lozenges with good relief. ?No sick contacts. States that he went to PT on 02/07/2022. ?Moderna x2 and one booster ?Flu vaccine UTD ?Has been using OTC HBP flu and cold. ?URI symptoms have started to improve ? ?Currently undergoing Multipe myloma treatment through the cancer center.  Patient had a Stim cell transplant in July 2020 at [facility].  He did reach out to his oncology team and they recommended him following up with PCP prior to his next treatment which would be Thursday, 02/14/2022 ? ?Review of Systems  ?Constitutional:  Negative for chills and fever.  ?HENT:  Negative for congestion, ear discharge, ear pain, sinus pain and sore throat (improved).   ?Respiratory:  Positive for cough (non productive). Negative for shortness of breath and wheezing.   ?Musculoskeletal:  Negative for joint pain and myalgias.  ?Neurological:  Negative for dizziness.  ? ? ?   ?Objective:  ?  ?BP (!) 176/94   Pulse 67   Temp 97.6 ?F (36.4 ?C)   Resp 16   Ht '5\' 10"'$  (1.778 m)   Wt 210 lb 6 oz (95.4 kg)   SpO2 97%   BMI 30.19 kg/m?  ? ? ?Physical Exam ?Vitals and nursing note reviewed.  ?Constitutional:   ?   Appearance: Normal appearance.  ?HENT:  ?   Right Ear: Tympanic membrane, ear canal and external ear normal.  ?   Left Ear: Tympanic membrane, ear canal and external ear normal.  ?   Nose:  ?   Right Sinus: No maxillary sinus tenderness or frontal sinus tenderness.  ?   Left Sinus: No maxillary sinus tenderness or frontal sinus tenderness.  ?    Mouth/Throat:  ?   Mouth: Mucous membranes are moist.  ?   Pharynx: Oropharynx is clear. Posterior oropharyngeal erythema present.  ?Cardiovascular:  ?   Rate and Rhythm: Normal rate and regular rhythm.  ?   Heart sounds: Normal heart sounds.  ?Pulmonary:  ?   Effort: Pulmonary effort is normal.  ?   Breath sounds: Normal breath sounds.  ?Abdominal:  ?   Comments: Hyperactive bowel sounds ?  ?Lymphadenopathy:  ?   Cervical: Cervical adenopathy present.  ?Neurological:  ?   Mental Status: He is alert.  ? ? ?Results for orders placed or performed in visit on 02/11/22  ?[place] COVID-19  ?Result Value Ref Range  ? SARS Coronavirus 2 Ag Negative Negative  ?Rapid Strep A  ?Result Value Ref Range  ? Rapid Strep A Screen Positive (A) Negative  ? ? ? ?   ?Assessment & Plan:  ? ?Problem List Items Addressed This Visit   ? ?  ? Respiratory  ? Strep pharyngitis  ?  Patient strep test positive in office.  We will treat patient with amoxicillin 500 mg twice daily for 10 days.  Did ask patient to call oncology group to make sure he is still okay to go to treatment on Thursday  even though he will be on antibiotics but around other immune compromised patients.  Patient knowledge understood.  Follow-up if no improvement ? ?  ?  ? Relevant Medications  ? amoxicillin (AMOXIL) 500 MG capsule  ?  ? Other  ? Acute cough - Primary  ?  COVID test negative in office. ? ?  ?  ? Relevant Orders  ? POC COVID-19 (Completed)  ? Sore throat  ?  COVID test negative in office.  Strep test positive in office.  Can continue over-the-counter treatments such as salt water gargles and throat lozenges.  We will place patient on amoxicillin 500 mg twice daily for 10 days ? ?  ?  ? Relevant Orders  ? Rapid Strep A (Completed)  ? ? ?Meds ordered this encounter  ?Medications  ? amoxicillin (AMOXIL) 500 MG capsule  ?  Sig: Take 1 capsule (500 mg total) by mouth 2 (two) times daily for 10 days.  ?  Dispense:  20 capsule  ?  Refill:  0  ?  Order Specific Question:    Supervising Provider  ?  Answer:   Loura Pardon A [[id number]]  ? ? ?No follow-ups on file. ? ?Romilda Garret, NP ? ? ?

## 2022-02-11 NOTE — Patient Instructions (Signed)
Nice to see you today ?I sent antibiotics to your pharmacy ?Reach out to the cancer center to see if they want you in center or not this Thursday ?Follow up if no improvement ? ?

## 2022-02-11 NOTE — Assessment & Plan Note (Signed)
COVID test negative in office. 

## 2022-02-11 NOTE — Assessment & Plan Note (Signed)
COVID test negative in office.  Strep test positive in office.  Can continue over-the-counter treatments such as salt water gargles and throat lozenges.  We will place patient on amoxicillin 500 mg twice daily for 10 days ?

## 2022-02-11 NOTE — Telephone Encounter (Signed)
Patient called reporting that he went to see PCP and he is negative for COVID, but he does have Strep and is going to pick up antibiotics now to get started on. He has appointment this Thursday for lab / doctor / treatment Please advise if any changes to this plan. ?

## 2022-02-13 ENCOUNTER — Encounter: Payer: Self-pay | Admitting: Oncology

## 2022-02-14 ENCOUNTER — Inpatient Hospital Stay: Payer: Medicare Other

## 2022-02-14 ENCOUNTER — Inpatient Hospital Stay: Payer: Medicare Other | Admitting: Oncology

## 2022-02-25 ENCOUNTER — Inpatient Hospital Stay: Payer: Medicare Other | Attending: Oncology | Admitting: Oncology

## 2022-02-25 ENCOUNTER — Inpatient Hospital Stay: Payer: Medicare Other

## 2022-02-25 ENCOUNTER — Other Ambulatory Visit: Payer: Self-pay

## 2022-02-25 ENCOUNTER — Encounter: Payer: Self-pay | Admitting: Oncology

## 2022-02-25 VITALS — BP 134/89 | HR 69 | Temp 96.4°F | Resp 16

## 2022-02-25 VITALS — BP 133/84 | HR 65 | Temp 98.7°F | Resp 19 | Wt 209.2 lb

## 2022-02-25 DIAGNOSIS — I129 Hypertensive chronic kidney disease with stage 1 through stage 4 chronic kidney disease, or unspecified chronic kidney disease: Secondary | ICD-10-CM | POA: Diagnosis not present

## 2022-02-25 DIAGNOSIS — D696 Thrombocytopenia, unspecified: Secondary | ICD-10-CM

## 2022-02-25 DIAGNOSIS — D6959 Other secondary thrombocytopenia: Secondary | ICD-10-CM | POA: Insufficient documentation

## 2022-02-25 DIAGNOSIS — C9002 Multiple myeloma in relapse: Secondary | ICD-10-CM

## 2022-02-25 DIAGNOSIS — C61 Malignant neoplasm of prostate: Secondary | ICD-10-CM | POA: Insufficient documentation

## 2022-02-25 DIAGNOSIS — C9000 Multiple myeloma not having achieved remission: Secondary | ICD-10-CM | POA: Insufficient documentation

## 2022-02-25 DIAGNOSIS — N179 Acute kidney failure, unspecified: Secondary | ICD-10-CM

## 2022-02-25 DIAGNOSIS — N1831 Chronic kidney disease, stage 3a: Secondary | ICD-10-CM | POA: Insufficient documentation

## 2022-02-25 DIAGNOSIS — Z5111 Encounter for antineoplastic chemotherapy: Secondary | ICD-10-CM | POA: Diagnosis not present

## 2022-02-25 DIAGNOSIS — Z5112 Encounter for antineoplastic immunotherapy: Secondary | ICD-10-CM | POA: Insufficient documentation

## 2022-02-25 LAB — CBC WITH DIFFERENTIAL/PLATELET
Abs Immature Granulocytes: 0.01 10*3/uL (ref 0.00–0.07)
Basophils Absolute: 0 10*3/uL (ref 0.0–0.1)
Basophils Relative: 0 %
Eosinophils Absolute: 0.1 10*3/uL (ref 0.0–0.5)
Eosinophils Relative: 1 %
HCT: 38.2 % — ABNORMAL LOW (ref 39.0–52.0)
Hemoglobin: 13.3 g/dL (ref 13.0–17.0)
Immature Granulocytes: 0 %
Lymphocytes Relative: 37 %
Lymphs Abs: 1.6 10*3/uL (ref 0.7–4.0)
MCH: 33.7 pg (ref 26.0–34.0)
MCHC: 34.8 g/dL (ref 30.0–36.0)
MCV: 96.7 fL (ref 80.0–100.0)
Monocytes Absolute: 0.4 10*3/uL (ref 0.1–1.0)
Monocytes Relative: 9 %
Neutro Abs: 2.3 10*3/uL (ref 1.7–7.7)
Neutrophils Relative %: 53 %
Platelets: 81 10*3/uL — ABNORMAL LOW (ref 150–400)
RBC: 3.95 MIL/uL — ABNORMAL LOW (ref 4.22–5.81)
RDW: 13.1 % (ref 11.5–15.5)
WBC: 4.4 10*3/uL (ref 4.0–10.5)
nRBC: 0 % (ref 0.0–0.2)

## 2022-02-25 LAB — COMPREHENSIVE METABOLIC PANEL
ALT: 18 U/L (ref 0–44)
AST: 21 U/L (ref 15–41)
Albumin: 4.1 g/dL (ref 3.5–5.0)
Alkaline Phosphatase: 55 U/L (ref 38–126)
Anion gap: 8 (ref 5–15)
BUN: 22 mg/dL (ref 8–23)
CO2: 24 mmol/L (ref 22–32)
Calcium: 9.5 mg/dL (ref 8.9–10.3)
Chloride: 107 mmol/L (ref 98–111)
Creatinine, Ser: 1.31 mg/dL — ABNORMAL HIGH (ref 0.61–1.24)
GFR, Estimated: >60 mL/min (ref >60)
Glucose, Bld: 105 mg/dL — ABNORMAL HIGH (ref 70–99)
Potassium: 4.1 mmol/L (ref 3.5–5.1)
Sodium: 139 mmol/L (ref 135–145)
Total Bilirubin: 0.4 mg/dL (ref 0.3–1.2)
Total Protein: 6.6 g/dL (ref 6.5–8.1)

## 2022-02-25 LAB — PSA: Prostatic Specific Antigen: 4.13 ng/mL — ABNORMAL HIGH (ref 0.00–4.00)

## 2022-02-25 MED ORDER — DEXAMETHASONE 4 MG PO TABS
20.0000 mg | ORAL_TABLET | Freq: Once | ORAL | Status: AC
  Administered 2022-02-25: 20 mg via ORAL
  Filled 2022-02-25: qty 5

## 2022-02-25 MED ORDER — ACYCLOVIR 400 MG PO TABS: 400.0000 mg | ORAL_TABLET | Freq: Two times a day (BID) | ORAL | 2 refills | Status: DC

## 2022-02-25 MED ORDER — DENOSUMAB 120 MG/1.7ML ~~LOC~~ SOLN
120.0000 mg | Freq: Once | SUBCUTANEOUS | Status: AC
  Administered 2022-02-25: 120 mg via SUBCUTANEOUS
  Filled 2022-02-25: qty 1.7

## 2022-02-25 MED ORDER — DARATUMUMAB-HYALURONIDASE-FIHJ 1800-30000 MG-UT/15ML ~~LOC~~ SOLN
1800.0000 mg | Freq: Once | SUBCUTANEOUS | Status: AC
  Administered 2022-02-25: 1800 mg via SUBCUTANEOUS
  Filled 2022-02-25: qty 15

## 2022-02-25 MED ORDER — DIPHENHYDRAMINE HCL 25 MG PO CAPS
50.0000 mg | ORAL_CAPSULE | Freq: Once | ORAL | Status: AC
  Administered 2022-02-25: 50 mg via ORAL
  Filled 2022-02-25: qty 2

## 2022-02-25 MED ORDER — ACETAMINOPHEN 325 MG PO TABS
650.0000 mg | ORAL_TABLET | Freq: Once | ORAL | Status: AC
  Administered 2022-02-25: 650 mg via ORAL
  Filled 2022-02-25: qty 2

## 2022-02-25 NOTE — Progress Notes (Signed)
Patient monitored x 30 minutes post Darzalex injection. Patient tolerated treatment well.  ?

## 2022-02-25 NOTE — Progress Notes (Signed)
?Hematology/Oncology Progress note ?[phone] [phone] ?  ? ? ? ?Patient Care Team: ?Tonia Ghent, MD as PCP - General (Family Medicine) ?Earlie Server, MD as Consulting Physician (Oncology) ?Marinell Blight, MD as Referring Physician (Hematology and Oncology) ?Diannia Ruder, MD as Referring Physician (Hematology and Oncology) ? ?REASON FOR VISIT ?Follow up for multiple myeloma.  ? ?HISTORY OF PRESENTING ILLNESS:  ?Luis Burke is a  66 y.o.  male with PMH listed below presents for follow up of multiple myeloma.  ?# 07/27/2018 multiple myeloma panel showed M protein of 0.1, IgG 662, IgA 49, IgM 7. ?Patient was called back to further labs done. ?08/10/2018, free light chain ratio showed extremely high level of kappa free light chain 10,183, with a kappa lambda light chain ratio of 1414.31 ?LDH 164 ?Beta-2 microglobulin 5 ?Patient was called and discuss about results.  He was recommended to undergo bone marrow biopsy and PET scan. ?08/19/2018 bone marrow biopsy showed hypercellular marrow 80%, involved by plasma cell neoplasm up to 95%.  Consistent with plasma cell myeloma. Myeloma FISH  gain of gain of CEP12 ? ?09/10/2018 skeletal survey showed questionable small lucent lesions within the midshaft of the humerus bilaterally.  Otherwise no suspicious focal lytic lesion or acute bone abnormality. ?08/25/2018 PET scan showed no definite hypermetabolic bone disease but CT findings are highly suspicious for numerous small myelomatous lesions involving the spine, sternum and scattered ribs. ? 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 ?He received preparative regimen with melphalan 200 mg/m? on 04/22/2019 followed by autologous stem cell infusion on 04/23/2019. ?Currently he is transfusion independent.  Transfusion criteria with as needed hemoglobin less than 7.5 for platelet less than 10,000.  Transplant course was complicated with febrile neutropenia grade 3,  treated with vancomycin and cephapirin until engraftment on 05/06/2019. ?Patient also had engraftment syndrome and had to be started on Solu-Medrol 25 mg twice daily on 05/05/2019.  Transition to Medrol Dosepak on 05/07/2019 to complete steroid taper as outpatient.  Patient is currently on acyclovir for 1 year after transplant, dose was reduced on 722 11/20/2018 due to renal function. ?Patient had a baseline CKD with creatinine running between 1.4-1.6.  He had acute on chronic kidney injury and creatinine went up to 2.2 on 722.  Fluid hydration was given and creatinine decreased to baseline 1.5-1.7. ?His Hickman catheter was removed.  Candida Cruris treated with topical clotrimazole 1% 3 times daily and to resolution.. ? ?#  seen by Dr. Tonia Brooms BMT team on 06/30/2019.revaccination at [facility] begins at 12 months post transplant ?I discussed with [facility] hematology Dr.Choi and he recommends plans as listed below.  ?-patient to be started on Ixazomib maintenance: -Ixazomib 52m on D1/D8/D15 out of 28 day cycle.  ?-patient will start re-vaccination 12 months post transplant.  ?-Post transplant bone marrow biopsy if clinically indicated.  ?# seen by [facility] bone marrow transplant team in June 2021.  Patient has been started on immunization protocol.  He reports no new complaints. ? ?#[facility]?1.  Lucent lesion in the L4 spinous process measuring 4 mm which is  ?nonspecific. Consider confirmation of marrow replacing process with MRI.  ?2.  Incompletely characterized renal cysts  ? ?# He continues to have chronic back pain and neck pain.  [facility], MRI lumbar spine with and without contrast showed no evidence of spinal metastasis.  Lumbar spondylosis is most pronounced at L5-S1 where  there is severe right and moderate left neural foraminal stenosis. ?08/11/2020, x-ray cervical spine complete with flexion and extension showed ?The cervical spine is visualized from C1 to the  top of T1 on the lateral  view.  ?Reversal of the normal cervical lordosis with a focal kyphosis centered at the C5-C6 intervertebral level. Trace anterolisthesis of C4 onto C5. Trace retrolisthesis of C6 on C7. No evidence of dynamic instability is noted on  ?the flexion or extension views.  ? ?No prevertebral soft tissue swelling. Cervical vertebral body heights are maintained. Multilevel degenerative changes of the cervical spine including discogenic disease, most pronounced at C5-C6 and C6-C7, and facet arthropathy most pronounced at C4-C5. Multilevel mild to moderate left neural foraminal osseous narrowing of the mid to lower cervical vertebrae, possibly centrally/artifactual by patient positioning. ? ?#07/28/2020 bone marrow normocellular marrow with polytypic plasmacytosis, 3 to 8%   Patient is in remission.  Cytogenetics negative for trisomy 12.  Negative myeloma FISH panel. ? ? ?# Lumbar lesion,  ?11/13/20 [facility]  MRI cervical sine w/wo contrast - no evidence of metastatic disease C3-4 severe spinal stenosis, left neural foraminal narrowing. C4-5 spinal canal stenosis. ?[facility] MRI results showed no metastatic spine lesion. ?Chronic back pain and neck pain, images are consistent with chronic degenerative disease.  Patient continues to follow-up with [facility] orthopedic surgeon. ? ?04/03/2021, light chain ratio increased to 3.76.   ?05/15/2021 bone marrow biopsy showed slightly hypercellular bone marrow for age with slight plasmacytosis.  5% plasma cells with kappa light chain restriction.  Cytogenetics normal.  Myeloma FISH negative ?# 05/15/2021 bone marrow biopsy showed slightly hypercellular bone marrow for age with slight plasmacytosis.  5% plasma cells with kappa light chain restriction.  Cytogenetics normal.  Myeloma FISH negative ? ?# 05/31/2021 Daratumumab -Pomalyst-dexamethasone-D1 patient had grade 2 infusion reaction to daratumumab and received steroids, Benadryl, famotidine.  He was able to finish treatment.D8  was able to finish Daratumumab with no infusion reactions. ? 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  ?Patient follows with podiatry Dr. Amalia Hailey for stress reaction fracture of the second metatarsal right.  He was cleared by Dr. Amalia Hailey to go back to [facility].   ? ?08/16/21 Skeletal survey showed stable small right humeral and scapular lucencies.  Ill-defined small lucencies in the proximal right humeral shaft more conspicuous than on prior study of 08/10/2018. ?09/21/2021, PET scan showed no evidence of suspicious or new hypermetabolism, scattered small lucent foci seen previously in the sternum and the spine are less prominent today and have resolved in some regions.  Aortic atherosclerosis. ? ?Patient went to the emergency room on 11/18/2021 for neck pain evaluation.  11/18/2021, MRI cervical spine with and without contrast showed no multiple myeloma lesion or marrow edema identified in the cervical spine.  Abnormal dural thickening and a/or abnormal epidural space at C4/C5 levels.  This is nonspecific.  ER physician discussed the case with neurosurgery Dr. Cari Caraway who feels that this is more likely degenerative disc disease.  Recommend steroids.  Patient was given a tapering course of prednisone. ? ?11/20/2021, status post bone marrow biopsy which showed 1% of plasma cells. The plasma cells generally display polyclonal staining pattern for kappa and lambda light chains although a few very small clusters appear kappa light chain restricted.  The findings are very limited but most suggestive of minimal residual plasma cell neoplasm especially in the presence of monoclonal protein with kappa light chain specificity. Normal cytogenetics and myeloma FISH ? ?#01/14/2022, received IVIG 400 mg/kg x1 ?INTERVAL HISTORY ?  Luis Burke is a 66 y.o. male who has above history reviewed by me today for follow-up multiple myeloma.  ?Patient has had strep throat during interval and was treated with antibiotics.  Symptom has  improved. ?Patient denies any fever, chills, nausea vomiting diarrhea. ? ? ?Review of Systems  ?Constitutional:  Negative for appetite change, chills, fatigue, fever and unexpected weight change.  ?HENT:   Negative f

## 2022-02-25 NOTE — Patient Instructions (Signed)
[facility] CANCER CTR AT [place]  Discharge Instructions: ?Thank you for choosing [facility] to provide your oncology and hematology care.  ?If you have a lab appointment with the [place] [facility], please go directly to the [facility] [facility] and check in at the registration area. ? ?Wear comfortable clothing and clothing appropriate for easy access to any Portacath or PICC line.  ? ?We strive to give you quality time with your provider. You may need to reschedule your appointment if you arrive late (15 or more minutes).  Arriving late affects you and other patients whose appointments are after yours.  Also, if you miss three or more appointments without notifying the office, you may be dismissed from the clinic at the provider?s discretion.    ?  ?For prescription refill requests, have your pharmacy contact our office and allow 72 hours for refills to be completed.   ? ?Today you received the following chemotherapy and/or immunotherapy agents Darzalex    ?  ?To help prevent nausea and vomiting after your treatment, we encourage you to take your nausea medication as directed. ? ?BELOW ARE SYMPTOMS THAT SHOULD BE REPORTED IMMEDIATELY: ?*FEVER GREATER THAN 100.4 F (38 ?C) OR HIGHER ?*CHILLS OR SWEATING ?*NAUSEA AND VOMITING THAT IS NOT CONTROLLED WITH YOUR NAUSEA MEDICATION ?*UNUSUAL SHORTNESS OF BREATH ?*UNUSUAL BRUISING OR BLEEDING ?*URINARY PROBLEMS (pain or burning when urinating, or frequent urination) ?*BOWEL PROBLEMS (unusual diarrhea, constipation, pain near the anus) ?TENDERNESS IN MOUTH AND THROAT WITH OR WITHOUT PRESENCE OF ULCERS (sore throat, sores in mouth, or a toothache) ?UNUSUAL RASH, SWELLING OR PAIN  ?UNUSUAL VAGINAL DISCHARGE OR ITCHING  ? ?Items with * indicate a potential emergency and should be followed up as soon as possible or go to the Emergency Department if any problems should occur. ? ?Please show the CHEMOTHERAPY ALERT CARD or IMMUNOTHERAPY ALERT CARD at check-in to  the Emergency Department and triage nurse. ? ?Should you have questions after your visit or need to cancel or reschedule your appointment, please contact [facility] CANCER [place] AT [place]  [phone] and follow the prompts.  Office hours are 8:00 a.m. to 4:30 p.m. Monday - Friday. Please note that voicemails left after 4:00 p.m. may not be returned until the following business day.  We are closed weekends and major holidays. You have access to a nurse at all times for urgent questions. Please call the main number to the clinic [phone] and follow the prompts. ? ?For any non-urgent questions, you may also contact your provider using MyChart. We now offer e-Visits for anyone 15 and older to request care online for non-urgent symptoms. For details visit [email or web address]. ?  ?Also download the MyChart app! Go to the app store, search "MyChart", open the app, select [place], and log in with your MyChart username and password. ? ?Due to Covid, a mask is required upon entering the hospital/clinic. If you do not have a mask, one will be given to you upon arrival. For doctor visits, patients may have 1 support person aged 1 or older with them. For treatment visits, patients cannot have anyone with them due to current Covid guidelines and our immunocompromised population.  ?

## 2022-02-26 LAB — KAPPA/LAMBDA LIGHT CHAINS
Kappa free light chain: 9.7 mg/L (ref 3.3–19.4)
Kappa, lambda light chain ratio: 2.26 — ABNORMAL HIGH (ref 0.26–1.65)
Lambda free light chains: 4.3 mg/L — ABNORMAL LOW (ref 5.7–26.3)

## 2022-02-28 LAB — MULTIPLE MYELOMA PANEL, SERUM
Albumin SerPl Elph-Mcnc: 3.8 g/dL (ref 2.9–4.4)
Albumin/Glob SerPl: 1.8 — ABNORMAL HIGH (ref 0.7–1.7)
Alpha 1: 0.2 g/dL (ref 0.0–0.4)
Alpha2 Glob SerPl Elph-Mcnc: 0.6 g/dL (ref 0.4–1.0)
B-Globulin SerPl Elph-Mcnc: 0.9 g/dL (ref 0.7–1.3)
Gamma Glob SerPl Elph-Mcnc: 0.6 g/dL (ref 0.4–1.8)
Globulin, Total: 2.2 g/dL (ref 2.2–3.9)
IgA: 76 mg/dL (ref 61–437)
IgG (Immunoglobin G), Serum: 597 mg/dL — ABNORMAL LOW (ref 603–1613)
IgM (Immunoglobulin M), Srm: 6 mg/dL — ABNORMAL LOW (ref 20–172)
Total Protein ELP: 6 g/dL (ref 6.0–8.5)

## 2022-03-22 ENCOUNTER — Other Ambulatory Visit: Payer: Self-pay

## 2022-03-22 DIAGNOSIS — C9000 Multiple myeloma not having achieved remission: Secondary | ICD-10-CM

## 2022-03-22 DIAGNOSIS — C61 Malignant neoplasm of prostate: Secondary | ICD-10-CM

## 2022-03-25 ENCOUNTER — Inpatient Hospital Stay: Payer: Medicare Other

## 2022-03-25 ENCOUNTER — Inpatient Hospital Stay (HOSPITAL_BASED_OUTPATIENT_CLINIC_OR_DEPARTMENT_OTHER): Payer: Medicare Other | Admitting: Oncology

## 2022-03-25 ENCOUNTER — Encounter: Payer: Self-pay | Admitting: Oncology

## 2022-03-25 ENCOUNTER — Inpatient Hospital Stay: Payer: Medicare Other | Attending: Oncology

## 2022-03-25 ENCOUNTER — Other Ambulatory Visit: Payer: Self-pay

## 2022-03-25 DIAGNOSIS — Z5112 Encounter for antineoplastic immunotherapy: Secondary | ICD-10-CM | POA: Diagnosis present

## 2022-03-25 DIAGNOSIS — N1831 Chronic kidney disease, stage 3a: Secondary | ICD-10-CM | POA: Insufficient documentation

## 2022-03-25 DIAGNOSIS — C9002 Multiple myeloma in relapse: Secondary | ICD-10-CM

## 2022-03-25 DIAGNOSIS — C9000 Multiple myeloma not having achieved remission: Secondary | ICD-10-CM

## 2022-03-25 DIAGNOSIS — D6959 Other secondary thrombocytopenia: Secondary | ICD-10-CM | POA: Diagnosis not present

## 2022-03-25 DIAGNOSIS — I129 Hypertensive chronic kidney disease with stage 1 through stage 4 chronic kidney disease, or unspecified chronic kidney disease: Secondary | ICD-10-CM | POA: Insufficient documentation

## 2022-03-25 DIAGNOSIS — M542 Cervicalgia: Secondary | ICD-10-CM

## 2022-03-25 DIAGNOSIS — D696 Thrombocytopenia, unspecified: Secondary | ICD-10-CM

## 2022-03-25 DIAGNOSIS — R771 Abnormality of globulin: Secondary | ICD-10-CM | POA: Diagnosis not present

## 2022-03-25 DIAGNOSIS — C7951 Secondary malignant neoplasm of bone: Secondary | ICD-10-CM | POA: Insufficient documentation

## 2022-03-25 DIAGNOSIS — G8929 Other chronic pain: Secondary | ICD-10-CM

## 2022-03-25 DIAGNOSIS — Z5111 Encounter for antineoplastic chemotherapy: Secondary | ICD-10-CM

## 2022-03-25 DIAGNOSIS — N179 Acute kidney failure, unspecified: Secondary | ICD-10-CM

## 2022-03-25 LAB — COMPREHENSIVE METABOLIC PANEL
ALT: 19 U/L (ref 0–44)
AST: 20 U/L (ref 15–41)
Albumin: 4.3 g/dL (ref 3.5–5.0)
Alkaline Phosphatase: 65 U/L (ref 38–126)
Anion gap: 7 (ref 5–15)
BUN: 19 mg/dL (ref 8–23)
CO2: 25 mmol/L (ref 22–32)
Calcium: 9 mg/dL (ref 8.9–10.3)
Chloride: 108 mmol/L (ref 98–111)
Creatinine, Ser: 1.39 mg/dL — ABNORMAL HIGH (ref 0.61–1.24)
GFR, Estimated: 56 mL/min — ABNORMAL LOW (ref >60)
Glucose, Bld: 103 mg/dL — ABNORMAL HIGH (ref 70–99)
Potassium: 4.1 mmol/L (ref 3.5–5.1)
Sodium: 140 mmol/L (ref 135–145)
Total Bilirubin: 0.7 mg/dL (ref 0.3–1.2)
Total Protein: 7 g/dL (ref 6.5–8.1)

## 2022-03-25 LAB — CBC WITH DIFFERENTIAL/PLATELET
Abs Immature Granulocytes: 0.01 10*3/uL (ref 0.00–0.07)
Basophils Absolute: 0 10*3/uL (ref 0.0–0.1)
Basophils Relative: 0 %
Eosinophils Absolute: 0.1 10*3/uL (ref 0.0–0.5)
Eosinophils Relative: 1 %
HCT: 41.5 % (ref 39.0–52.0)
Hemoglobin: 14 g/dL (ref 13.0–17.0)
Immature Granulocytes: 0 %
Lymphocytes Relative: 42 %
Lymphs Abs: 2 10*3/uL (ref 0.7–4.0)
MCH: 33.1 pg (ref 26.0–34.0)
MCHC: 33.7 g/dL (ref 30.0–36.0)
MCV: 98.1 fL (ref 80.0–100.0)
Monocytes Absolute: 0.4 10*3/uL (ref 0.1–1.0)
Monocytes Relative: 9 %
Neutro Abs: 2.2 10*3/uL (ref 1.7–7.7)
Neutrophils Relative %: 48 %
Platelets: 85 10*3/uL — ABNORMAL LOW (ref 150–400)
RBC: 4.23 MIL/uL (ref 4.22–5.81)
RDW: 13.4 % (ref 11.5–15.5)
WBC: 4.7 10*3/uL (ref 4.0–10.5)
nRBC: 0 % (ref 0.0–0.2)

## 2022-03-25 MED ORDER — DENOSUMAB 120 MG/1.7ML ~~LOC~~ SOLN
120.0000 mg | Freq: Once | SUBCUTANEOUS | Status: AC
  Administered 2022-03-25: 120 mg via SUBCUTANEOUS
  Filled 2022-03-25: qty 1.7

## 2022-03-25 MED ORDER — ACETAMINOPHEN 325 MG PO TABS
650.0000 mg | ORAL_TABLET | Freq: Once | ORAL | Status: AC
  Administered 2022-03-25: 650 mg via ORAL
  Filled 2022-03-25: qty 2

## 2022-03-25 MED ORDER — DARATUMUMAB-HYALURONIDASE-FIHJ 1800-30000 MG-UT/15ML ~~LOC~~ SOLN
1800.0000 mg | Freq: Once | SUBCUTANEOUS | Status: AC
  Administered 2022-03-25: 1800 mg via SUBCUTANEOUS
  Filled 2022-03-25: qty 15

## 2022-03-25 MED ORDER — DEXAMETHASONE 4 MG PO TABS
20.0000 mg | ORAL_TABLET | Freq: Once | ORAL | Status: AC
  Administered 2022-03-25: 20 mg via ORAL
  Filled 2022-03-25: qty 5

## 2022-03-25 MED ORDER — DIPHENHYDRAMINE HCL 25 MG PO CAPS
50.0000 mg | ORAL_CAPSULE | Freq: Once | ORAL | Status: AC
  Administered 2022-03-25: 50 mg via ORAL
  Filled 2022-03-25: qty 2

## 2022-03-25 NOTE — Assessment & Plan Note (Addendum)
Kappa Light chain multiple myeloma beta 2 microglobulin 5 and normal albumin.  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  06/04/2021 5% plasma cells -August 2022-February 2023, patient completed 7 cycles of daratumumab/Pomalyst/dexamethasone. 11/20/2021, status post bone marrow biopsy which showed 1% of plasma cells.  Likely he has minimal residual disease. Currently on daratumumab maintenance. Labs are reviewed and discussed with patient. Proceed with daratumumab today 

## 2022-03-25 NOTE — Assessment & Plan Note (Signed)
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 Discussed with neurosurgery Dr.Yarbrough. no need for biopsy for now. Flexeril '5mg'$  daily PRN continue physical therapy.

## 2022-03-25 NOTE — Progress Notes (Signed)
Pt here for follow up. No new concerns voiced.   

## 2022-03-25 NOTE — Assessment & Plan Note (Signed)
IgG has improved.  PRN IVIG if immunoglobulin less than 400 

## 2022-03-25 NOTE — Assessment & Plan Note (Signed)
Chemotherapy induced. Stable. Monitor counts.  

## 2022-03-25 NOTE — Assessment & Plan Note (Signed)
avoid nephrotoxin.  Encourage hydration. 

## 2022-03-25 NOTE — Patient Instructions (Signed)
[facility] CTR AT [place]  Discharge Instructions: ?Thank you for choosing [facility] to provide your oncology and hematology care.  ?If you have a lab appointment with the Cancer Center, please go directly to the Cancer Center and check in at the registration area. ? ?Wear comfortable clothing and clothing appropriate for easy access to any Portacath or PICC line.  ? ?We strive to give you quality time with your provider. You may need to reschedule your appointment if you arrive late (15 or more minutes).  Arriving late affects you and other patients whose appointments are after yours.  Also, if you miss three or more appointments without notifying the office, you may be dismissed from the clinic at the provider?s discretion.    ?  ?For prescription refill requests, have your pharmacy contact our office and allow 72 hours for refills to be completed.   ? ?Today you received the following chemotherapy and/or immunotherapy agents DARZALEX    ?  ?To help prevent nausea and vomiting after your treatment, we encourage you to take your nausea medication as directed. ? ?BELOW ARE SYMPTOMS THAT SHOULD BE REPORTED IMMEDIATELY: ?*FEVER GREATER THAN 100.4 F (38 ?C) OR HIGHER ?*CHILLS OR SWEATING ?*NAUSEA AND VOMITING THAT IS NOT CONTROLLED WITH YOUR NAUSEA MEDICATION ?*UNUSUAL SHORTNESS OF BREATH ?*UNUSUAL BRUISING OR BLEEDING ?*URINARY PROBLEMS (pain or burning when urinating, or frequent urination) ?*BOWEL PROBLEMS (unusual diarrhea, constipation, pain near the anus) ?TENDERNESS IN MOUTH AND THROAT WITH OR WITHOUT PRESENCE OF ULCERS (sore throat, sores in mouth, or a toothache) ?UNUSUAL RASH, SWELLING OR PAIN  ?UNUSUAL VAGINAL DISCHARGE OR ITCHING  ? ?Items with * indicate a potential emergency and should be followed up as soon as possible or go to the Emergency Department if any problems should occur. ? ?Please show the CHEMOTHERAPY ALERT CARD or IMMUNOTHERAPY ALERT CARD at check-in to  the Emergency Department and triage nurse. ? ?Should you have questions after your visit or need to cancel or reschedule your appointment, please contact [facility] AT [place]  [phone] and follow the prompts.  Office hours are 8:00 a.m. to 4:30 p.m. Monday - Friday. Please note that voicemails left after 4:00 p.m. may not be returned until the following business day.  We are closed weekends and major holidays. You have access to a nurse at all times for urgent questions. Please call the main number to the clinic [phone] and follow the prompts. ? ?For any non-urgent questions, you may also contact your provider using MyChart. We now offer e-Visits for anyone 18 and older to request care online for non-urgent symptoms. For details visit [email or web address]. ?  ?Also download the MyChart app! Go to the app store, search "MyChart", open the app, select [facility], and log in with your MyChart username and password. ? ?Due to Covid, a mask is required upon entering the hospital/clinic. If you do not have a mask, one will be given to you upon arrival. For doctor visits, patients may have 1 support person aged 18 or older with them. For treatment visits, patients cannot have anyone with them due to current Covid guidelines and our immunocompromised population.  ? ?Daratumumab injection ?What is this medication? ?DARATUMUMAB (dar a toom ue mab) is a monoclonal antibody. It is used to treat multiple myeloma. ?This medicine may be used for other purposes; ask your health care provider or pharmacist if you have questions. ?COMMON BRAND NAME(S): DARZALEX ?What should I tell my care team before I   take this medication? ?They need to know if you have any of these conditions: ?hereditary fructose intolerance ?infection (especially a virus infection such as chickenpox, herpes, or hepatitis B virus) ?lung or breathing disease (asthma, COPD) ?an unusual or allergic reaction to daratumumab,  sorbitol, other medicines, foods, dyes, or preservatives ?pregnant or trying to get pregnant ?breast-feeding ?How should I use this medication? ?This medicine is for infusion into a vein. It is given by a health care professional in a hospital or clinic setting. ?Talk to your pediatrician regarding the use of this medicine in children. Special care may be needed. ?Overdosage: If you think you have taken too much of this medicine contact a poison control center or emergency room at once. ?NOTE: This medicine is only for you. Do not share this medicine with others. ?What if I miss a dose? ?Keep appointments for follow-up doses as directed. It is important not to miss your dose. Call your doctor or health care professional if you are unable to keep an appointment. ?What may interact with this medication? ?Interactions have not been studied. ?This list may not describe all possible interactions. Give your health care provider a list of all the medicines, herbs, non-prescription drugs, or dietary supplements you use. Also tell them if you smoke, drink alcohol, or use illegal drugs. Some items may interact with your medicine. ?What should I watch for while using this medication? ?Your condition will be monitored carefully while you are receiving this medicine. ?This medicine can cause serious allergic reactions. To reduce your risk, your health care provider may give you other medicine to take before receiving this one. Be sure to follow the directions from your health care provider. ?This medicine can affect the results of blood tests to match your blood type. These changes can last for up to 6 months after the final dose. Your healthcare provider will do blood tests to match your blood type before you start treatment. Tell all of your healthcare providers that you are being treated with this medicine before receiving a blood transfusion. ?This medicine can affect the results of some tests used to determine treatment  response; extra tests may be needed to evaluate response. ?Do not become pregnant while taking this medicine or for 3 months after stopping it. Women should inform their health care provider if they wish to become pregnant or think they might be pregnant. There is a potential for serious side effects to an unborn child. Talk to your health care provider for more information. Do not breast-feed an infant while taking this medicine. ?What side effects may I notice from receiving this medication? ?Side effects that you should report to your care team as soon as possible: ?Allergic reactions--skin rash, itching or hives, swelling of the face, lips, or tongue ?Blurred vision ?Infection--fever, chills, cough, sore throat, pain or trouble passing urine ?Infusion reactions--dizziness, fast heartbeat, feeling faint or lightheaded, falls, headache, increase in blood pressure, nausea, vomiting, or wheezing or trouble breathing with loud or whistling sounds ?Unusual bleeding or bruising ?Side effects that usually do not require medical attention (report to your care team if they continue or are bothersome): ?Constipation ?Diarrhea ?Pain, tingling, numbness in the hands or feet ?Swelling of the ankles, feet, hands ?Tiredness ?This list may not describe all possible side effects. Call your doctor for medical advice about side effects. You may report side effects to FDA at 1-800-FDA-1088. ?Where should I keep my medication? ?This drug is given in a hospital or clinic and will not   be stored at home. NOTE: This sheet is a summary. It may not cover all possible information. If you have questions about this medicine, talk to your doctor, pharmacist, or health care provider.  2023 Elsevier/Gold Standard (2021-03-08 00:00:00)

## 2022-03-25 NOTE — Progress Notes (Signed)
Hematology/Oncology Progress note [phone] [phone]      Patient Care Team: Tonia Ghent, MD as PCP - General (Family Medicine) Earlie Server, MD as Consulting Physician (Oncology) Beather Arbour Lilyan Punt, MD as Referring Physician (Hematology and Oncology) Diannia Ruder, MD as Referring Physician (Hematology and Oncology)  REASON FOR VISIT Follow up for multiple myeloma.   ASSESSMENT & PLAN:  Multiple myeloma (HCC) Kappa Light chain multiple myeloma beta 2 microglobulin 5 and normal albumin.  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  06/04/2021 5% plasma cells -August 2022-February 2023, patient completed 7 cycles of daratumumab/Pomalyst/dexamethasone. 11/20/2021, status post bone marrow biopsy which showed 1% of plasma cells.  Likely he has minimal residual disease. Currently on daratumumab maintenance. Labs are reviewed and discussed with patient. Proceed with daratumumab today  Metastasis to bone ([place]) Majority of his lucencies were not hypermetabolic on previous PET scan Xgeva monthly.  Proceed with Xgeva today Continue calcium and vitamin D supplementation.   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  PRN IVIG if immunoglobulin less than 400  Stage 3a chronic kidney disease ([place]) avoid nephrotoxin.  Encourage hydration.  Neck pain, chronic MRI showed abnormal enhancement of spinal cord. Discussed with neurosurgery Dr.Yarbrough. no need for biopsy for now. Flexeril 44m daily PRN continue physical therapy.  Thrombocytopenia ([place] Chemotherapy induced. Stable. Monitor counts.   No orders of the defined types were placed in this encounter.  Follow up in 4 weeks, Lab MD DGwen HerAll questions were answered. The patient knows to call the clinic with any problems, questions or  concerns.  ZEarlie Server MD, PhD [facility] Hemat[facility] Oncology 03/25/2022   HISTORY OF PRESENTING ILLNESS:  Luis SKODAis a  66y.o.  male with PMH listed below presents for follow up of multiple myeloma.  Oncology History  [place]  08/19/2018 Bone Marrow Biopsy   08/19/2018 bone marrow biopsy showed hypercellular marrow 80%, involved by plasma cell neoplasm up to 95%.  Consistent with plasma cell myeloma. Myeloma FISH  gain of gain of CEP12   08/25/2018 Imaging   PET scan showed no definite hypermetabolic bone disease but CT findings are highly suspicious for numerous small myelomatous lesions involving the spine, sternum and scattered ribs.   08/26/2018 Initial Diagnosis   Multiple myeloma  # 07/27/2018 multiple myeloma panel showed M protein of 0.1, IgG 662, IgA 49, IgM 7. 08/10/2018, free light chain ratio showed extremely high level of kappa free light chain 10,183, with a kappa lambda light chain ratio of 1414.31,LDH 164, Beta-2 microglobulin 5     08/28/2018 - 03/17/2019 Chemotherapy   RVD x 9     04/23/2019 Bone Marrow Transplant   [facility]  He received preparative regimen with melphalan 200 mg/m on 04/22/2019 followed by autologous stem cell infusion on 04/23/2019. Transplant course was complicated with febrile neutropenia grade 3, treated with vancomycin and cephapirin until engraftment on 05/06/2019. Patient also had engraftment syndrome and had to be started on Solu-Medrol 25 mg twice daily on 05/05/2019.  Transition to Medrol Dosepak on 05/07/2019 to complete steroid taper as outpatient.  re-vaccination 12 months post transplant   08/10/2019 - 05/2022 Chemotherapy    Ixazomib 343mon D1/D8/D15 out of 28 day cycle       07/12/2020 Imaging   CT skeletal survey done  at [facility] 1.  Lucent lesion in the L4 spinous process measuring 4 mm which is  nonspecific. Consider confirmation of marrow replacing process with MRI.   2.  Incompletely characterized renal cysts    07/28/2020 Bone Marrow Biopsy    bone marrow normocellular marrow with polytypic plasmacytosis, 3 to 8%   Patient is in remission.  Cytogenetics negative for trisomy 12.  Negative myeloma FISH panel.   08/05/2020 Imaging   MRI lumbar spine with and without contrast was done at [facility] showed no evidence of spinal metastasis.  Lumbar spondylosis is most pronounced at L5-S1 where there is severe right and moderate left neural foraminal stenosis. 08/11/2020, x-ray cervical spine complete with flexion and extension showed The cervical spine is visualized from C1 to the top of T1 on the lateral  view.  Reversal of the normal cervical lordosis with a focal kyphosis centered at the C5-C6 intervertebral level. Trace anterolisthesis of C4 onto C5. Trace retrolisthesis of C6 on C7. No evidence of dynamic instability is noted on  the flexion or extension views.   No prevertebral soft tissue swelling. Cervical vertebral body heights are maintained. Multilevel degenerative changes of the cervical spine including discogenic disease, most pronounced at C5-C6 and C6-C7, and facet arthropathy most pronounced at C4-C5. Multilevel mild to moderate left neural foraminal osseous narrowing of the mid to lower cervical vertebrae, possibly centrally/artifactual by patient positioning.     05/15/2021 Bone Marrow Biopsy   Bone marrow biopsy showed slightly hypercellular bone marrow for age with slight plasmacytosis.  5% plasma cells with kappa light chain restriction.  Cytogenetics normal.  Myeloma FISH negative I discussed with [place]. we both feel that his bone marrow biopsy result reflects early relapse. Recommend stop Ixazomib. Switch to second line treatment with Daratumumab Pom[place]st Dexamethasone   05/31/2021 -  Chemotherapy   Patient is on Treatment Plan : MYELOMA Daratumumab + Pomalidomide + Dexamethasone q28d x 7 cycles     09/21/2021 Imaging    PET scan  showed no evidence of suspicious or new hypermetabolism, scattered small lucent foci seen previously in the sternum and the spine are less prominent today and have resolved in some regions.  Aortic atherosclerosis.   11/18/2021 Imaging   MRI cervical spine with and without contrast showed no multiple myeloma lesion or marrow edema identified in the cervical spine.  Abnormal dural thickening and a/or abnormal epidural space at C4/C5 levels.  This is nonspecific.  ER physician discussed the case with neurosurgery Dr. Cari Caraway who feels that this is more likely degenerative disc disease.   11/20/2021 Bone Marrow Biopsy   bone marrow biopsy which showed 1% of plasma cells. The plasma cells generally display polyclonal staining pattern for kappa and lambda light chains although a few very small clusters appear kappa light chain restricted.  The findings are very limited but most suggestive of minimal residual plasma cell neoplasm especially in the presence of monoclonal protein with kappa light chain specificity. Normal cytogenetics and myeloma FISH   12/20/2021 -  Chemotherapy   Daratumumab monthly maintenance  # I discussed with patient's [facility] oncologist Dr. Maylene Roes.  We have agreed upon de-escalating his regimen to Daratumumab monthly maintenance for now. Discontinue pomalyst.     Metastasis to bone ([place])   #01/14/2022, received IVIG 400 mg/kg x1   INTERVAL HISTORY Luis Burke is a 66 y.o. male who has above history reviewed by me today for follow-up multiple myeloma.  Patient reports feeling well.  Neck pain has  improved recently.  No new complaints.    Review of Systems  Constitutional:  Negative for appetite change, chills, fatigue, fever and unexpected weight change.  HENT:   Negative for hearing loss.   Eyes:  Negative for eye problems and icterus.  Respiratory:  Negative for chest tightness, cough and shortness of breath.   Cardiovascular:  Negative for chest pain and leg swelling.   Gastrointestinal:  Negative for abdominal distention and abdominal pain.  Endocrine: Negative for hot flashes.  Genitourinary:  Negative for difficulty urinating, dysuria and frequency.   Musculoskeletal:  Positive for back pain and neck pain. Negative for arthralgias.  Skin:  Negative for itching and rash.  Neurological:  Negative for light-headedness and numbness.  Hematological:  Negative for adenopathy. Does not bruise/bleed easily.  Psychiatric/Behavioral:  Negative for confusion.     MEDICAL HISTORY:  Past Medical History:  Diagnosis Date   AKI (acute kidney injury) ([place]) 01/06/2019   Anemia    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[place])    autologous stem cell bone marrow transplant on 04/23/2019.   Fatty liver    on u/s 07/2018   Hyperlipidemia 03/2004   Hypertension 1994   Multiple myeloma ([place])    Snores     SURGICAL HISTORY: Past Surgical History:  Procedure Laterality Date   CATARACT EXTRACTION Left 10/10/2021   CATARACT EXTRACTION W/PHACO Right 03/04/2018   Procedure: CATARACT EXTRACTION PHACO AND INTRAOCULAR LENS PLACEMENT ([place])  RIGHT TORIC;  Surgeon: Leandrew Koyanagi, MD;  Location: [place];  Service: Ophthalmology;  Laterality: Right;  Per [place] no Toric Lens 1:45 5.3   CATARACT EXTRACTION W/PHACO Left 10/10/2021   Procedure: CATARACT EXTRACTION PHACO AND INTRAOCULAR LENS PLACEMENT ([place]) LEFT;  Surgeon: Leandrew Koyanagi, MD;  Location: [facility];  Service: Ophthalmology;  Laterality: Left;  5.16 1:03.8   COLONOSCOPY WITH PROPOFOL N/A 07/20/2018   Procedure: COLONOSCOPY WITH PROPOFOL;  Surgeon: Jonathon Bellows, MD;  Location: [facility] ENDOSCOPY;  Service: Gastroenterology;  Laterality: N/A;   ESOPHAGOGASTRODUODENOSCOPY (EGD) WITH PROPOFOL N/A 07/20/2018   Procedure: ESOPHAGOGASTRODUODENOSCOPY (EGD) WITH PROPOFOL;  Surgeon: Jonathon Bellows, MD;  Location: [facility] ENDOSCOPY;  Service: Gastroenterology;  Laterality: N/A;   EYE SURGERY Right    HERNIA REPAIR      L Lap herniorraphy  04/2000   Left wrist ganglionectomy      SOCIAL HISTORY: Social History   Socioeconomic History   Marital status: Married    Spouse name: Not on file   Number of children: 1   Years of education: Not on file   Highest education level: Not on file  Occupational History   Occupation: [employer]  Tobacco Use   Smoking status: Never   Smokeless tobacco: Never   Tobacco comments:    Occassionally  Vaping Use   Vaping Use: Never used  Substance and Sexual Activity   Alcohol use: Not Currently    Alcohol/week: 3.0 standard drinks of alcohol    Types: 3 Cans of beer per week   Drug use: No   Sexual activity: Not on file  Other Topics Concern   Not on file  Social History Narrative   Divorced 2009, married 06/14/21.     His daughter died 4 days after giving birth to the patient's granddaughter   Has joint custody of his deceased daughter's child   Worked at [employer] is [place], retired 2020.     Social Determinants of Health   Financial Resource Strain: Not on file  Food Insecurity: Not on file  Transportation Needs: Not on file  Physical Activity: Not on file  Stress: Not on file  Social Connections: Not on file  Intimate Partner Violence: Not on file    FAMILY HISTORY: Family History  Problem Relation Age of Onset   Hypertension Mother    Diabetes Mother    Heart disease Mother        CAD   Dementia Mother    Prostate cancer Father    Hypertension Father    Heart disease Father        MI 02/03   Colon cancer Father    Hypertension Sister    Hypertension Sister    Hypertension Sister     ALLERGIES:  is allergic to bactrim [sulfamethoxazole-trimethoprim], clonidine derivatives, and tadalafil.  MEDICATIONS:  Current Outpatient Medications  Medication Sig Dispense Refill   acetaminophen (TYLENOL) 325 MG tablet Take 2 tablets (650 mg total) by mouth See admin instructions. 1 hour prior to chemotherapy treatments 30 tablet 0    acyclovir (ZOVIRAX) 400 MG tablet Take 1 tablet (400 mg total) by mouth 2 (two) times daily. 60 tablet 2   amLODipine (NORVASC) 5 MG tablet TAKE 2 TABLETS BY MOUTH EVERY DAY 180 tablet 1   ASPIRIN 81 PO Take 81 mg by mouth daily.     B Complex Vitamins (VITAMIN B COMPLEX PO) Take 1 Dose by mouth daily.     co-enzyme Q-10 50 MG capsule Take 50 mg by mouth daily.     [facility] VITAMIN B12 1000 MCG tablet TAKE 1 TABLET BY MOUTH EVERY DAY 90 tablet 3   Denosumab (XGEVA [place]) Inject into the skin every 30 (thirty) days. Last rcvd 09/27/21     diphenhydrAMINE (BENADRYL) 50 MG tablet Take 1 tablet (50 mg total) by mouth See admin instructions. Take 1 tablet 1 hour prior to chemotherapy treatments. 30 tablet 0   hydrALAZINE (APRESOLINE) 10 MG tablet TAKE 1 TABLET(10 MG) BY MOUTH THREE TIMES DAILY 270 tablet 1   lisinopril (ZESTRIL) 20 MG tablet TAKE 1 TABLET BY MOUTH EVERY DAY 90 tablet 2   magnesium chloride (SLOW-MAG) 64 MG TBEC SR tablet Take 1 tablet (64 mg total) by mouth daily. 60 tablet 0   metoprolol succinate (TOPROL-XL) 50 MG 24 hr tablet TAKE 4 TABLETS BY MOUTH EVERY DAY, TAKE WITH OR IMMEDIATELY FOLLOWING A MEAL 360 tablet 2   montelukast (SINGULAIR) 10 MG tablet Take 1 tablet (10 mg total) by mouth See admin instructions. Take 1day prior to chemotherapy 30 tablet 1   ondansetron (ZOFRAN) 8 MG tablet Take 1 tablet (8 mg total) by mouth every 8 (eight) hours as needed. for nausea 30 tablet 1   pravastatin (PRAVACHOL) 10 MG tablet TAKE 1 TABLET BY MOUTH EVERY DAY 90 tablet 3   prochlorperazine (COMPAZINE) 10 MG tablet      sildenafil (REVATIO) 20 MG tablet Take 1-5 tablets (20-100 mg total) by mouth daily as needed. 50 tablet 12   zinc gluconate 50 MG tablet Take 50 mg by mouth daily.     chlorhexidine (PERIDEX) 0.12 % solution Use as directed 15 mLs in the mouth or throat 2 (two) times daily. (Patient not taking: Reported on 03/25/2022) 473 mL 1   dexamethasone (DECADRON) 4 MG tablet Take 5 tablets (20  mg total) by mouth once a week. Take at least 1 hour prior to infusion appt. Take with food. To start on 06/07/21. (Patient not taking: Reported on 03/25/2022) 60 tablet 0   No current facility-administered medications for  this visit.   Facility-Administered Medications Ordered in Other Visits  Medication Dose Route Frequency Provider Last Rate Last Admin   0.9 %  sodium chloride infusion   Intravenous Once Earlie Server, MD         PHYSICAL EXAMINATION: ECOG PERFORMANCE STATUS: 1 - Symptomatic but completely ambulatory Vitals:   03/25/22 0905  Resp: 18   Filed Weights   03/25/22 0905  Weight: 211 lb (95.7 kg)    Physical Exam Constitutional:      General: He is not in acute distress. HENT:     Head: Normocephalic and atraumatic.  Eyes:     General: No scleral icterus. Cardiovascular:     Rate and Rhythm: Normal rate and regular rhythm.     Heart sounds: Normal heart sounds.  Pulmonary:     Effort: Pulmonary effort is normal. No respiratory distress.     Breath sounds: No wheezing.  Abdominal:     General: Bowel sounds are normal. There is no distension.     Palpations: Abdomen is soft.  Musculoskeletal:        General: No deformity. Normal range of motion.     Cervical back: Normal range of motion and neck supple.  Skin:    General: Skin is warm and dry.     Findings: No erythema or rash.  Neurological:     Mental Status: He is alert and oriented to person, place, and time. Mental status is at baseline.     Cranial Nerves: No cranial nerve deficit.     Coordination: Coordination normal.  Psychiatric:        Mood and Affect: Mood normal.      LABORATORY DATA:  I have reviewed the data as listed Lab Results  Component Value Date   WBC 4.7 03/25/2022   HGB 14.0 03/25/2022   HCT 41.5 03/25/2022   MCV 98.1 03/25/2022   PLT 85 (L) 03/25/2022   Recent Labs    12/20/21 0817 01/17/22 0953 02/25/22 0842  NA 135 134* 139  K 3.9 3.9 4.1  CL 103 101 107  CO2 22 25  24   GLUCOSE 117* 102* 105*  BUN 18 24* 22  CREATININE 1.26* 1.21 1.31*  CALCIUM 9.1 9.6 9.5  GFRNONAA >60 >60 >60  PROT 6.5 7.4 6.6  ALBUMIN 4.0 4.4 4.1  AST 18 22 21   ALT 18 19 18   ALKPHOS 59 55 55  BILITOT 0.7 0.8 0.4    Iron/TIBC/Ferritin/ %Sat    Component Value Date/Time   IRON 74 07/01/2018 0944   TIBC 250 07/01/2018 0944   FERRITIN 357 07/01/2018 0944   IRONPCTSAT 30 07/01/2018 0944    Lab Results  Component Value Date   TOTALPROTELP 6.0 02/25/2022   ALBUMINELP 4.0 03/08/2021   A1GS 0.2 03/08/2021   A2GS 0.7 03/08/2021   BETS 1.1 03/08/2021   GAMS 1.1 03/08/2021   MSPIKE Not Observed 03/08/2021   SPEI Comment 03/08/2021   Lab Results  Component Value Date   KPAFRELGTCHN 9.7 02/25/2022   LAMBDASER 4.3 (L) 02/25/2022   KAPLAMBRATIO 2.26 (H) 02/25/2022    RADIOGRAPHIC STUDIES: I have personally reviewed the radiological images as listed and agreed with the findings in the report. DG Foot Complete Right  Result Date: 12/27/2021 Please see detailed radiograph report in office note.

## 2022-03-25 NOTE — Assessment & Plan Note (Addendum)
Majority of his lucencies were not hypermetabolic on previous PET scan Xgeva monthly.  Proceed with Xgeva today Continue calcium and vitamin D supplementation.

## 2022-03-26 ENCOUNTER — Telehealth: Payer: Self-pay | Admitting: *Deleted

## 2022-03-26 LAB — KAPPA/LAMBDA LIGHT CHAINS
Kappa free light chain: 11.7 mg/L (ref 3.3–19.4)
Kappa, lambda light chain ratio: 1.67 — ABNORMAL HIGH (ref 0.26–1.65)
Lambda free light chains: 7 mg/L (ref 5.7–26.3)

## 2022-03-26 NOTE — Telephone Encounter (Signed)
Per Dr. Tasia Catchings continue his acyclovir since he is on [place]

## 2022-03-26 NOTE — Telephone Encounter (Signed)
Patient called asking if he is to continue the acyclovir or stop it. Please advise

## 2022-03-26 NOTE — Telephone Encounter (Signed)
Call returned to patient and informed to continue Acyclovir he agreed to continue it and asked what it is used for and I explained that it is prophylactic  being used for him to prevent herpes zoster which can cause fever blisters and shingles. He thanked me for calling

## 2022-03-28 LAB — MULTIPLE MYELOMA PANEL, SERUM
Albumin SerPl Elph-Mcnc: 3.9 g/dL (ref 2.9–4.4)
Albumin/Glob SerPl: 1.7 (ref 0.7–1.7)
Alpha 1: 0.2 g/dL (ref 0.0–0.4)
Alpha2 Glob SerPl Elph-Mcnc: 0.7 g/dL (ref 0.4–1.0)
B-Globulin SerPl Elph-Mcnc: 0.9 g/dL (ref 0.7–1.3)
Gamma Glob SerPl Elph-Mcnc: 0.5 g/dL (ref 0.4–1.8)
Globulin, Total: 2.3 g/dL (ref 2.2–3.9)
IgA: 86 mg/dL (ref 61–437)
IgG (Immunoglobin G), Serum: 535 mg/dL — ABNORMAL LOW (ref 603–1613)
IgM (Immunoglobulin M), Srm: 7 mg/dL — ABNORMAL LOW (ref 20–172)
Total Protein ELP: 6.2 g/dL (ref 6.0–8.5)

## 2022-04-02 ENCOUNTER — Telehealth: Payer: Self-pay | Admitting: *Deleted

## 2022-04-02 NOTE — Telephone Encounter (Signed)
Patient called stating that he has to go to the dentist Thursday fpr pain in his left lower tooth. He states that he either has a cavity or it is fractured. He is asking if this is ok since he is getting Xgeva and chemotherapy. Please advise

## 2022-04-02 NOTE — Telephone Encounter (Signed)
Per Dr. Tasia Catchings ok to be seen by Dentist, hold off on any elective invasive dental work. If Dentist feels patient needs any invasive work then ok and we will hold off on Xgeva.

## 2022-04-02 NOTE — Telephone Encounter (Signed)
Call returned to patient and he will see dentist and have him determine what is going on and then call us back to let us know

## 2022-04-04 ENCOUNTER — Telehealth: Payer: Self-pay | Admitting: *Deleted

## 2022-04-04 NOTE — Telephone Encounter (Signed)
Call returned to patient and informed of doctor response. He said that he is going to have to go see an endodontist and may need a root canal

## 2022-04-05 ENCOUNTER — Telehealth: Payer: Self-pay | Admitting: *Deleted

## 2022-04-05 NOTE — Telephone Encounter (Signed)
Patient called to report that last night, he had vomiting, chills, and diarrhea, He states that he he gets bout of diarrhea normally, but not the vomiting or chills. He did not check his temp during this "I was hugging the toilet" He took Pepto which he vomited back up so later he took an antiemetic pill he had and was able to go to sleep. He states that he is feeling fine this morning, no vomiting, diarrhea, or chills. He has not yet started the Pen VK, but does report that for about a week now he has been taking an antifungal all natural medicine that he bought on line for his toenail fungus. He states that he did not take one last night and he will not take any more until he gets ok from Dr Cathie Hoops. He will bring the bottle with him at his next visit.

## 2022-04-05 NOTE — Telephone Encounter (Signed)
Patient informed of doctor response and states he will let the endodontist know

## 2022-04-08 ENCOUNTER — Encounter: Payer: Self-pay | Admitting: Oncology

## 2022-04-18 ENCOUNTER — Other Ambulatory Visit: Payer: Self-pay | Admitting: Oncology

## 2022-04-18 ENCOUNTER — Telehealth: Payer: Self-pay

## 2022-04-18 MED ORDER — ACETAMINOPHEN-CODEINE 300-30 MG PO TABS: 1.0000 | ORAL_TABLET | Freq: Three times a day (TID) | ORAL | 0 refills | Status: DC | PRN

## 2022-04-18 NOTE — Telephone Encounter (Signed)
Dr. Yu please advise  

## 2022-04-18 NOTE — Telephone Encounter (Signed)
Patient called in stating he is still have pain with his tooth. Patient states he seen his orthodontist whom  stated he did not need a root canal. He states he has finished up the rest of the prescribed penicillin and is currently taking tylenol, which is not helping him. Patient wants to know if its something we can give him stronger than tylenol. His dentist office does not open back up Monday 7/10. He is scheduled for Chemo and MD visit on 7/10.

## 2022-04-18 NOTE — Telephone Encounter (Signed)
Spoke to pt and informed him of recommendations. Pt verbalized understanding.

## 2022-04-22 ENCOUNTER — Inpatient Hospital Stay (HOSPITAL_BASED_OUTPATIENT_CLINIC_OR_DEPARTMENT_OTHER): Payer: Medicare Other | Admitting: Oncology

## 2022-04-22 ENCOUNTER — Encounter: Payer: Self-pay | Admitting: Oncology

## 2022-04-22 ENCOUNTER — Inpatient Hospital Stay: Payer: Medicare Other

## 2022-04-22 ENCOUNTER — Inpatient Hospital Stay: Payer: Medicare Other | Attending: Oncology

## 2022-04-22 DIAGNOSIS — Z5111 Encounter for antineoplastic chemotherapy: Secondary | ICD-10-CM

## 2022-04-22 DIAGNOSIS — R771 Abnormality of globulin: Secondary | ICD-10-CM | POA: Diagnosis not present

## 2022-04-22 DIAGNOSIS — C7951 Secondary malignant neoplasm of bone: Secondary | ICD-10-CM

## 2022-04-22 DIAGNOSIS — C9000 Multiple myeloma not having achieved remission: Secondary | ICD-10-CM

## 2022-04-22 DIAGNOSIS — D696 Thrombocytopenia, unspecified: Secondary | ICD-10-CM

## 2022-04-22 DIAGNOSIS — Z5112 Encounter for antineoplastic immunotherapy: Secondary | ICD-10-CM | POA: Insufficient documentation

## 2022-04-22 DIAGNOSIS — I129 Hypertensive chronic kidney disease with stage 1 through stage 4 chronic kidney disease, or unspecified chronic kidney disease: Secondary | ICD-10-CM | POA: Diagnosis not present

## 2022-04-22 DIAGNOSIS — C9002 Multiple myeloma in relapse: Secondary | ICD-10-CM

## 2022-04-22 DIAGNOSIS — N1831 Chronic kidney disease, stage 3a: Secondary | ICD-10-CM

## 2022-04-22 DIAGNOSIS — D6959 Other secondary thrombocytopenia: Secondary | ICD-10-CM | POA: Insufficient documentation

## 2022-04-22 DIAGNOSIS — C9001 Multiple myeloma in remission: Secondary | ICD-10-CM | POA: Diagnosis not present

## 2022-04-22 LAB — CBC WITH DIFFERENTIAL/PLATELET
Abs Immature Granulocytes: 0.01 10*3/uL (ref 0.00–0.07)
Basophils Absolute: 0 10*3/uL (ref 0.0–0.1)
Basophils Relative: 0 %
Eosinophils Absolute: 0 10*3/uL (ref 0.0–0.5)
Eosinophils Relative: 1 %
HCT: 41.1 % (ref 39.0–52.0)
Hemoglobin: 14.2 g/dL (ref 13.0–17.0)
Immature Granulocytes: 0 %
Lymphocytes Relative: 36 %
Lymphs Abs: 1.7 10*3/uL (ref 0.7–4.0)
MCH: 34 pg (ref 26.0–34.0)
MCHC: 34.5 g/dL (ref 30.0–36.0)
MCV: 98.3 fL (ref 80.0–100.0)
Monocytes Absolute: 0.4 10*3/uL (ref 0.1–1.0)
Monocytes Relative: 8 %
Neutro Abs: 2.6 10*3/uL (ref 1.7–7.7)
Neutrophils Relative %: 55 %
Platelets: 85 10*3/uL — ABNORMAL LOW (ref 150–400)
RBC: 4.18 MIL/uL — ABNORMAL LOW (ref 4.22–5.81)
RDW: 13.4 % (ref 11.5–15.5)
WBC: 4.7 10*3/uL (ref 4.0–10.5)
nRBC: 0 % (ref 0.0–0.2)

## 2022-04-22 LAB — COMPREHENSIVE METABOLIC PANEL
ALT: 19 U/L (ref 0–44)
AST: 20 U/L (ref 15–41)
Albumin: 4.3 g/dL (ref 3.5–5.0)
Alkaline Phosphatase: 58 U/L (ref 38–126)
Anion gap: 7 (ref 5–15)
BUN: 18 mg/dL (ref 8–23)
CO2: 24 mmol/L (ref 22–32)
Calcium: 9.2 mg/dL (ref 8.9–10.3)
Chloride: 107 mmol/L (ref 98–111)
Creatinine, Ser: 1.35 mg/dL — ABNORMAL HIGH (ref 0.61–1.24)
GFR, Estimated: 58 mL/min — ABNORMAL LOW (ref >60)
Glucose, Bld: 104 mg/dL — ABNORMAL HIGH (ref 70–99)
Potassium: 3.9 mmol/L (ref 3.5–5.1)
Sodium: 138 mmol/L (ref 135–145)
Total Bilirubin: 0.7 mg/dL (ref 0.3–1.2)
Total Protein: 6.8 g/dL (ref 6.5–8.1)

## 2022-04-22 MED ORDER — DARATUMUMAB-HYALURONIDASE-FIHJ 1800-30000 MG-UT/15ML ~~LOC~~ SOLN
1800.0000 mg | Freq: Once | SUBCUTANEOUS | Status: AC
  Administered 2022-04-22: 1800 mg via SUBCUTANEOUS
  Filled 2022-04-22: qty 15

## 2022-04-22 MED ORDER — DIPHENHYDRAMINE HCL 25 MG PO CAPS
50.0000 mg | ORAL_CAPSULE | Freq: Once | ORAL | Status: AC
  Administered 2022-04-22: 50 mg via ORAL
  Filled 2022-04-22: qty 2

## 2022-04-22 MED ORDER — ACETAMINOPHEN 325 MG PO TABS
650.0000 mg | ORAL_TABLET | Freq: Once | ORAL | Status: AC
  Administered 2022-04-22: 650 mg via ORAL
  Filled 2022-04-22: qty 2

## 2022-04-22 MED ORDER — DEXAMETHASONE 4 MG PO TABS
20.0000 mg | ORAL_TABLET | Freq: Once | ORAL | Status: AC
  Administered 2022-04-22: 20 mg via ORAL
  Filled 2022-04-22: qty 5

## 2022-04-22 NOTE — Assessment & Plan Note (Signed)
IgG has improved.  PRN IVIG if immunoglobulin less than 400

## 2022-04-22 NOTE — Progress Notes (Signed)
Per MD, go ahead with daratumumab today. No xgeva.

## 2022-04-22 NOTE — Progress Notes (Signed)
Hematology/Oncology Progress note [phone] [phone]      Patient Care Team: Tonia Ghent, MD as PCP - General (Family Medicine) Earlie Server, MD as Consulting Physician (Oncology) Beather Arbour Lilyan Punt, MD as Referring Physician (Hematology and Oncology) Diannia Ruder, MD as Referring Physician (Hematology and Oncology)  REASON FOR VISIT Follow up for multiple myeloma.   ASSESSMENT & PLAN:  Multiple myeloma (HCC) Kappa Light chain multiple myeloma beta 2 microglobulin 5 and normal albumin.  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  06/04/2021 5% plasma cells -August 2022-February 2023, patient completed 7 cycles of daratumumab/Pomalyst/dexamethasone. 11/20/2021, status post bone marrow biopsy which showed 1% of plasma cells.  Likely he has minimal residual disease. Currently on daratumumab maintenance. Labs are reviewed and discussed with patient. Proceed with daratumumab today  Thrombocytopenia ([place]) Chemotherapy induced. Stable. Monitor counts.   Encounter for antineoplastic chemotherapy Chemotherapy as listed above.  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  PRN IVIG if immunoglobulin less than 400  Metastasis to bone (HCC) Majority of his lucencies were not hypermetabolic on previous PET scan Xgeva monthly.  Hold off Xgeva due to upcoming dental work.  Recommend to avoid elective dental work 6 weeks prior in 6 weeks after Xgeva treatments. Continue calcium and vitamin D supplementation.   Stage 3a chronic kidney disease ([place]) avoid nephrotoxin.  Encourage hydration.  No orders of the defined types were placed in this encounter.  Follow up in 4 weeks, Lab MD Dara  All questions were answered. The patient knows to call the clinic with any problems, questions or concerns.  Earlie Server, MD, PhD [facility] Hematology Oncology 04/22/2022   HISTORY OF PRESENTING ILLNESS:  Luis Burke is a  66 y.o.  male with PMH listed below presents for follow up of multiple myeloma.  Oncology History  Multiple myeloma ([place])  08/19/2018 Bone Marrow Biopsy   08/19/2018 bone marrow biopsy showed hypercellular marrow 80%, involved by plasma cell neoplasm up to 95%.  Consistent with plasma cell myeloma. Myeloma FISH  gain of gain of CEP12   08/25/2018 Imaging   PET scan showed no definite hypermetabolic bone disease but CT findings are highly suspicious for numerous small myelomatous lesions involving the spine, sternum and scattered ribs.   08/26/2018 Initial Diagnosis   Multiple myeloma  # 07/27/2018 multiple myeloma panel showed M protein of 0.1, IgG 662, IgA 49, IgM 7. 08/10/2018, free light chain ratio showed extremely high level of kappa free light chain 10,183, with a kappa lambda light chain ratio of 1414.31,LDH 164, Beta-2 microglobulin 5     08/28/2018 - 03/17/2019 Chemotherapy   RVD x 9     04/23/2019 Bone Marrow Transplant   autologous stem cell bone marrow transplant at [facility].  He received preparative regimen with melphalan 200 mg/m on 04/22/2019 followed by autologous stem cell infusion on 04/23/2019. Transplant course was complicated with febrile neutropenia grade 3, treated with vancomycin and cephapirin until engraftment on 05/06/2019. Patient also had engraftment syndrome and had to be started on Solu-Medrol 25 mg twice daily on 05/05/2019.  Transition to Medrol Dosepak on 05/07/2019 to complete steroid taper as outpatient.  re-vaccination 12 months post transplant   08/10/2019 - 05/2022 Chemotherapy    Ixazomib $RemoveB'3mg'rmYNxRlp$  on D1/D8/D15 out of 28 day cycle       07/12/2020 Imaging   CT skeletal survey done  at [facility] 1.  Lucent lesion in the L4 spinous process measuring 4 mm which is  nonspecific. Consider confirmation of marrow replacing process with MRI.  2.  Incompletely  characterized renal cysts    07/28/2020 Bone Marrow Biopsy    bone marrow normocellular marrow with polytypic plasmacytosis, 3 to 8%   Patient is in remission.  Cytogenetics negative for trisomy 12.  Negative myeloma FISH panel.   08/05/2020 Imaging   MRI lumbar spine with and without contrast was done at [facility] showed no evidence of spinal metastasis.  Lumbar spondylosis is most pronounced at L5-S1 where there is severe right and moderate left neural foraminal stenosis. 08/11/2020, x-ray cervical spine complete with flexion and extension showed The cervical spine is visualized from C1 to the top of T1 on the lateral  view.  Reversal of the normal cervical lordosis with a focal kyphosis centered at the C5-C6 intervertebral level. Trace anterolisthesis of C4 onto C5. Trace retrolisthesis of C6 on C7. No evidence of dynamic instability is noted on  the flexion or extension views.   No prevertebral soft tissue swelling. Cervical vertebral body heights are maintained. Multilevel degenerative changes of the cervical spine including discogenic disease, most pronounced at C5-C6 and C6-C7, and facet arthropathy most pronounced at C4-C5. Multilevel mild to moderate left neural foraminal osseous narrowing of the mid to lower cervical vertebrae, possibly centrally/artifactual by patient positioning.     05/15/2021 Bone Marrow Biopsy   Bone marrow biopsy showed slightly hypercellular bone marrow for age with slight plasmacytosis.  5% plasma cells with kappa light chain restriction.  Cytogenetics normal.  Myeloma FISH negative I discussed with [place]. we both feel that his bone marrow biopsy result reflects early relapse. Recommend stop Ixazomib. Switch to second line treatment with Daratumumab Pomlyst Dexamethasone   05/31/2021 -  Chemotherapy   Patient is on Treatment Plan : MYELOMA Daratumumab + Pomalidomide + Dexamethasone q28d x 7 cycles     09/21/2021 Imaging    PET scan showed no evidence  of suspicious or new hypermetabolism, scattered small lucent foci seen previously in the sternum and the spine are less prominent today and have resolved in some regions.  Aortic atherosclerosis.   11/18/2021 Imaging   MRI cervical spine with and without contrast showed no multiple myeloma lesion or marrow edema identified in the cervical spine.  Abnormal dural thickening and a/or abnormal epidural space at C4/C5 levels.  This is nonspecific.  ER physician discussed the case with neurosurgery Dr. Cari Caraway who feels that this is more likely degenerative disc disease.   11/20/2021 Bone Marrow Biopsy   bone marrow biopsy which showed 1% of plasma cells. The plasma cells generally display polyclonal staining pattern for kappa and lambda light chains although a few very small clusters appear kappa light chain restricted.  The findings are very limited but most suggestive of minimal residual plasma cell neoplasm especially in the presence of monoclonal protein with kappa light chain specificity. Normal cytogenetics and myeloma FISH   12/20/2021 -  Chemotherapy   Daratumumab monthly maintenance  # I discussed with patient's [facility] oncologist Dr. Maylene Roes.  We have agreed upon de-escalating his regimen to Daratumumab monthly maintenance for now. Discontinue pomalyst.     Metastasis to bone ([place])   #01/14/2022, received IVIG 400 mg/kg x1   INTERVAL HISTORY Luis Burke is a 66 y.o. male who has above history reviewed by me today for follow-up multiple myeloma.  Denies any nausea vomiting diarrhea today He recently  had tooth pain and there is possible plan of invasive dental work in the next few weeks.  No other new complaints.   Review of Systems  Constitutional:  Negative for appetite change, chills, fatigue, fever and unexpected weight change.  HENT:   Negative for hearing loss.   Eyes:  Negative for eye problems and icterus.  Respiratory:  Negative for chest tightness, cough and shortness of breath.    Cardiovascular:  Negative for chest pain and leg swelling.  Gastrointestinal:  Negative for abdominal distention and abdominal pain.  Endocrine: Negative for hot flashes.  Genitourinary:  Negative for difficulty urinating, dysuria and frequency.   Musculoskeletal:  Positive for back pain and neck pain. Negative for arthralgias.  Skin:  Negative for itching and rash.  Neurological:  Negative for light-headedness and numbness.  Hematological:  Negative for adenopathy. Does not bruise/bleed easily.  Psychiatric/Behavioral:  Negative for confusion.     MEDICAL HISTORY:  Past Medical History:  Diagnosis Date   AKI (acute kidney injury) ([place]) 01/06/2019   Anemia    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[place])    autologous stem cell bone marrow transplant on 04/23/2019.   Fatty liver    on u/s 07/2018   Hyperlipidemia 03/2004   Hypertension 1994   Multiple myeloma ([place])    Snores     SURGICAL HISTORY: Past Surgical History:  Procedure Laterality Date   CATARACT EXTRACTION Left 10/10/2021   CATARACT EXTRACTION W/PHACO Right 03/04/2018   Procedure: CATARACT EXTRACTION PHACO AND INTRAOCULAR LENS PLACEMENT ([place])  RIGHT TORIC;  Surgeon: Leandrew Koyanagi, MD;  Location: [place];  Service: Ophthalmology;  Laterality: Right;  [place] no Toric Lens 1:45 5.3   CATARACT EXTRACTION W/PHACO Left 10/10/2021   Procedure: CATARACT EXTRACTION PHACO AND INTRAOCULAR LENS PLACEMENT ([place]) LEFT;  Surgeon: Leandrew Koyanagi, MD;  Location: [place];  Service: Ophthalmology;  Laterality: Left;  5.16 1:03.8   COLONOSCOPY WITH PROPOFOL N/A 07/20/2018   Procedure: COLONOSCOPY WITH PROPOFOL;  Surgeon: Jonathon Bellows, MD;  Location: [facility] ENDOSCOPY;  Service: Gastroenterology;  Laterality: N/A;   ESOPHAGOGASTRODUODENOSCOPY (EGD) WITH PROPOFOL N/A 07/20/2018   Procedure: ESOPHAGOGASTRODUODENOSCOPY (EGD) WITH PROPOFOL;  Surgeon: Jonathon Bellows, MD;  Location: [facility] ENDOSCOPY;  Service:  Gastroenterology;  Laterality: N/A;   EYE SURGERY Right    HERNIA REPAIR     L Lap herniorraphy  04/2000   Left wrist ganglionectomy      SOCIAL HISTORY: Social History   Socioeconomic History   Marital status: Married    Spouse name: Not on file   Number of children: 1   Years of education: Not on file   Highest education level: Not on file  Occupational History   Occupation: [employer]  Tobacco Use   Smoking status: Never   Smokeless tobacco: Never   Tobacco comments:    Occassionally  Vaping Use   Vaping Use: Never used  Substance and Sexual Activity   Alcohol use: Not Currently    Alcohol/week: 3.0 standard drinks of alcohol    Types: 3 Cans of beer per week   Drug use: No   Sexual activity: Not on file  Other Topics Concern   Not on file  Social History Narrative   Divorced 2009, married Jun 08, 2021.     His daughter died 4 days after giving birth to the patient's granddaughter   Has joint custody of his deceased daughter's child   Worked at [employer] is [place], retired 2020.     Social Determinants of  Health   Financial Resource Strain: Not on file  Food Insecurity: Not on file  Transportation Needs: Not on file  Physical Activity: Not on file  Stress: Not on file  Social Connections: Not on file  Intimate Partner Violence: Not on file    FAMILY HISTORY: Family History  Problem Relation Age of Onset   Hypertension Mother    Diabetes Mother    Heart disease Mother        CAD   Dementia Mother    Prostate cancer Father    Hypertension Father    Heart disease Father        MI 02/03   Colon cancer Father    Hypertension Sister    Hypertension Sister    Hypertension Sister     ALLERGIES:  is allergic to bactrim [sulfamethoxazole-trimethoprim], clonidine derivatives, and tadalafil.  MEDICATIONS:  Current Outpatient Medications  Medication Sig Dispense Refill   acetaminophen (TYLENOL) 325 MG tablet Take 2 tablets (650 mg total) by mouth  See admin instructions. 1 hour prior to chemotherapy treatments 30 tablet 0   acetaminophen-codeine (TYLENOL #3) 300-30 MG tablet Take 1 tablet by mouth every 8 (eight) hours as needed for moderate pain. 10 tablet 0   acyclovir (ZOVIRAX) 400 MG tablet Take 1 tablet (400 mg total) by mouth 2 (two) times daily. 60 tablet 2   amLODipine (NORVASC) 5 MG tablet TAKE 2 TABLETS BY MOUTH EVERY DAY 180 tablet 1   ASPIRIN 81 PO Take 81 mg by mouth daily.     B Complex Vitamins (VITAMIN B COMPLEX PO) Take 1 Dose by mouth daily.     co-enzyme Q-10 50 MG capsule Take 50 mg by mouth daily.     [employer] VITAMIN B12 1000 MCG tablet TAKE 1 TABLET BY MOUTH EVERY DAY 90 tablet 3   Denosumab (XGEVA [place]) Inject into the skin every 30 (thirty) days. Last rcvd 09/27/21     diphenhydrAMINE (BENADRYL) 50 MG tablet Take 1 tablet (50 mg total) by mouth See admin instructions. Take 1 tablet 1 hour prior to chemotherapy treatments. 30 tablet 0   hydrALAZINE (APRESOLINE) 10 MG tablet TAKE 1 TABLET(10 MG) BY MOUTH THREE TIMES DAILY 270 tablet 1   lisinopril (ZESTRIL) 20 MG tablet TAKE 1 TABLET BY MOUTH EVERY DAY 90 tablet 2   magnesium chloride (SLOW-MAG) 64 MG TBEC SR tablet Take 1 tablet (64 mg total) by mouth daily. 60 tablet 0   metoprolol succinate (TOPROL-XL) 50 MG 24 hr tablet TAKE 4 TABLETS BY MOUTH EVERY DAY, TAKE WITH OR IMMEDIATELY FOLLOWING A MEAL 360 tablet 2   montelukast (SINGULAIR) 10 MG tablet Take 1 tablet (10 mg total) by mouth See admin instructions. Take 1day prior to chemotherapy 30 tablet 1   ondansetron (ZOFRAN) 8 MG tablet Take 1 tablet (8 mg total) by mouth every 8 (eight) hours as needed. for nausea 30 tablet 1   pravastatin (PRAVACHOL) 10 MG tablet TAKE 1 TABLET BY MOUTH EVERY DAY 90 tablet 3   prochlorperazine (COMPAZINE) 10 MG tablet      sildenafil (REVATIO) 20 MG tablet Take 1-5 tablets (20-100 mg total) by mouth daily as needed. 50 tablet 12   zinc gluconate 50 MG tablet Take 50 mg by mouth daily.      chlorhexidine (PERIDEX) 0.12 % solution Use as directed 15 mLs in the mouth or throat 2 (two) times daily. (Patient not taking: Reported on 03/25/2022) 473 mL 1   dexamethasone (DECADRON) 4 MG tablet Take 5  tablets (20 mg total) by mouth once a week. Take at least 1 hour prior to infusion appt. Take with food. To start on 06/07/21. (Patient not taking: Reported on 03/25/2022) 60 tablet 0   No current facility-administered medications for this visit.   Facility-Administered Medications Ordered in Other Visits  Medication Dose Route Frequency Provider Last Rate Last Admin   0.9 %  sodium chloride infusion   Intravenous Once Earlie Server, MD         PHYSICAL EXAMINATION: ECOG PERFORMANCE STATUS: 1 - Symptomatic but completely ambulatory Vitals:   04/22/22 0902  BP: (!) 127/91  Pulse: 74  Temp: (!) 97.4 F (36.3 C)   Filed Weights   04/22/22 0902  Weight: 208 lb (94.3 kg)    Physical Exam Constitutional:      General: He is not in acute distress. HENT:     Head: Normocephalic and atraumatic.  Eyes:     General: No scleral icterus. Cardiovascular:     Rate and Rhythm: Normal rate and regular rhythm.     Heart sounds: Normal heart sounds.  Pulmonary:     Effort: Pulmonary effort is normal. No respiratory distress.     Breath sounds: No wheezing.  Abdominal:     General: Bowel sounds are normal. There is no distension.     Palpations: Abdomen is soft.  Musculoskeletal:        General: No deformity. Normal range of motion.     Cervical back: Normal range of motion and neck supple.  Skin:    General: Skin is warm and dry.     Findings: No erythema or rash.  Neurological:     Mental Status: He is alert and oriented to person, place, and time. Mental status is at baseline.     Cranial Nerves: No cranial nerve deficit.     Coordination: Coordination normal.  Psychiatric:        Mood and Affect: Mood normal.      LABORATORY DATA:  I have reviewed the data as listed Lab Results   Component Value Date   WBC 4.7 04/22/2022   HGB 14.2 04/22/2022   HCT 41.1 04/22/2022   MCV 98.3 04/22/2022   PLT 85 (L) 04/22/2022   Recent Labs    02/25/22 0842 03/25/22 0849 04/22/22 0843  NA 139 140 138  K 4.1 4.1 3.9  CL 107 108 107  CO2 $Re'24 25 24  'Vqv$ GLUCOSE 105* 103* 104*  BUN $Re'22 19 18  'xuZ$ CREATININE 1.31* 1.39* 1.35*  CALCIUM 9.5 9.0 9.2  GFRNONAA >60 56* 58*  PROT 6.6 7.0 6.8  ALBUMIN 4.1 4.3 4.3  AST $Re'21 20 20  'ppp$ ALT $R'18 19 19  'OK$ ALKPHOS 55 65 58  BILITOT 0.4 0.7 0.7    Iron/TIBC/Ferritin/ %Sat    Component Value Date/Time   IRON 74 07/01/2018 0944   TIBC 250 07/01/2018 0944   FERRITIN 357 07/01/2018 0944   IRONPCTSAT 30 07/01/2018 0944    Lab Results  Component Value Date   TOTALPROTELP 6.2 03/25/2022   ALBUMINELP 4.0 03/08/2021   A1GS 0.2 03/08/2021   A2GS 0.7 03/08/2021   BETS 1.1 03/08/2021   GAMS 1.1 03/08/2021   MSPIKE Not Observed 03/08/2021   SPEI Comment 03/08/2021   Lab Results  Component Value Date   KPAFRELGTCHN 11.7 03/25/2022   LAMBDASER 7.0 03/25/2022   KAPLAMBRATIO 1.67 (H) 03/25/2022    RADIOGRAPHIC STUDIES: I have personally reviewed the radiological images as listed and agreed with the findings in  the report. No results found.

## 2022-04-22 NOTE — Assessment & Plan Note (Addendum)
Majority of his lucencies were not hypermetabolic on previous PET scan Xgeva monthly.  Hold off Xgeva due to upcoming dental work.  Recommend to avoid elective dental work 6 weeks prior in 6 weeks after Xgeva treatments. Continue calcium and vitamin D supplementation.

## 2022-04-22 NOTE — Assessment & Plan Note (Signed)
Chemotherapy induced. Stable. Monitor counts.  

## 2022-04-22 NOTE — Assessment & Plan Note (Signed)
avoid nephrotoxin.  Encourage hydration. 

## 2022-04-22 NOTE — Assessment & Plan Note (Addendum)
Kappa Light chain multiple myeloma beta 2 microglobulin 5 and normal albumin.  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  06/04/2021 5% plasma cells -August 2022-February 2023, patient completed 7 cycles of daratumumab/Pomalyst/dexamethasone. 11/20/2021, status post bone marrow biopsy which showed 1% of plasma cells.  Likely he has minimal residual disease. Currently on daratumumab maintenance. Labs are reviewed and discussed with patient. Proceed with daratumumab today 

## 2022-04-22 NOTE — Assessment & Plan Note (Signed)
Chemotherapy as listed above. 

## 2022-04-23 LAB — KAPPA/LAMBDA LIGHT CHAINS
Kappa free light chain: 10.5 mg/L (ref 3.3–19.4)
Kappa, lambda light chain ratio: 2.28 — ABNORMAL HIGH (ref 0.26–1.65)
Lambda free light chains: 4.6 mg/L — ABNORMAL LOW (ref 5.7–26.3)

## 2022-04-26 LAB — MULTIPLE MYELOMA PANEL, SERUM
Albumin SerPl Elph-Mcnc: 3.9 g/dL (ref 2.9–4.4)
Albumin/Glob SerPl: 1.7 (ref 0.7–1.7)
Alpha 1: 0.2 g/dL (ref 0.0–0.4)
Alpha2 Glob SerPl Elph-Mcnc: 0.7 g/dL (ref 0.4–1.0)
B-Globulin SerPl Elph-Mcnc: 0.9 g/dL (ref 0.7–1.3)
Gamma Glob SerPl Elph-Mcnc: 0.4 g/dL (ref 0.4–1.8)
Globulin, Total: 2.3 g/dL (ref 2.2–3.9)
IgA: 82 mg/dL (ref 61–437)
IgG (Immunoglobin G), Serum: 494 mg/dL — ABNORMAL LOW (ref 603–1613)
IgM (Immunoglobulin M), Srm: 7 mg/dL — ABNORMAL LOW (ref 20–172)
Total Protein ELP: 6.2 g/dL (ref 6.0–8.5)

## 2022-05-02 ENCOUNTER — Other Ambulatory Visit: Payer: Self-pay | Admitting: Family Medicine

## 2022-05-06 ENCOUNTER — Other Ambulatory Visit: Payer: Self-pay

## 2022-05-14 ENCOUNTER — Other Ambulatory Visit: Payer: 59

## 2022-05-14 ENCOUNTER — Other Ambulatory Visit: Payer: Self-pay

## 2022-05-17 ENCOUNTER — Other Ambulatory Visit: Payer: Medicare Other

## 2022-05-17 ENCOUNTER — Other Ambulatory Visit: Payer: Self-pay

## 2022-05-17 DIAGNOSIS — R972 Elevated prostate specific antigen [PSA]: Secondary | ICD-10-CM

## 2022-05-18 LAB — PSA: Prostate Specific Ag, Serum: 4.7 ng/mL — ABNORMAL HIGH (ref 0.0–4.0)

## 2022-05-20 ENCOUNTER — Inpatient Hospital Stay (HOSPITAL_BASED_OUTPATIENT_CLINIC_OR_DEPARTMENT_OTHER): Payer: Medicare Other | Admitting: Oncology

## 2022-05-20 ENCOUNTER — Inpatient Hospital Stay: Payer: Medicare Other | Attending: Oncology

## 2022-05-20 ENCOUNTER — Encounter: Payer: Self-pay | Admitting: Oncology

## 2022-05-20 ENCOUNTER — Inpatient Hospital Stay: Payer: Medicare Other

## 2022-05-20 VITALS — BP 132/81 | HR 71 | Temp 96.5°F | Resp 20

## 2022-05-20 DIAGNOSIS — Z5111 Encounter for antineoplastic chemotherapy: Secondary | ICD-10-CM | POA: Diagnosis not present

## 2022-05-20 DIAGNOSIS — C9000 Multiple myeloma not having achieved remission: Secondary | ICD-10-CM | POA: Insufficient documentation

## 2022-05-20 DIAGNOSIS — C9001 Multiple myeloma in remission: Secondary | ICD-10-CM | POA: Diagnosis not present

## 2022-05-20 DIAGNOSIS — C7951 Secondary malignant neoplasm of bone: Secondary | ICD-10-CM

## 2022-05-20 DIAGNOSIS — N1831 Chronic kidney disease, stage 3a: Secondary | ICD-10-CM | POA: Insufficient documentation

## 2022-05-20 DIAGNOSIS — Z5112 Encounter for antineoplastic immunotherapy: Secondary | ICD-10-CM | POA: Diagnosis present

## 2022-05-20 DIAGNOSIS — D696 Thrombocytopenia, unspecified: Secondary | ICD-10-CM

## 2022-05-20 DIAGNOSIS — R771 Abnormality of globulin: Secondary | ICD-10-CM

## 2022-05-20 DIAGNOSIS — I129 Hypertensive chronic kidney disease with stage 1 through stage 4 chronic kidney disease, or unspecified chronic kidney disease: Secondary | ICD-10-CM | POA: Insufficient documentation

## 2022-05-20 DIAGNOSIS — D6959 Other secondary thrombocytopenia: Secondary | ICD-10-CM | POA: Diagnosis not present

## 2022-05-20 DIAGNOSIS — C9002 Multiple myeloma in relapse: Secondary | ICD-10-CM

## 2022-05-20 LAB — COMPREHENSIVE METABOLIC PANEL
ALT: 24 U/L (ref 0–44)
AST: 22 U/L (ref 15–41)
Albumin: 4.5 g/dL (ref 3.5–5.0)
Alkaline Phosphatase: 62 U/L (ref 38–126)
Anion gap: 8 (ref 5–15)
BUN: 23 mg/dL (ref 8–23)
CO2: 24 mmol/L (ref 22–32)
Calcium: 9.6 mg/dL (ref 8.9–10.3)
Chloride: 108 mmol/L (ref 98–111)
Creatinine, Ser: 1.5 mg/dL — ABNORMAL HIGH (ref 0.61–1.24)
GFR, Estimated: 51 mL/min — ABNORMAL LOW (ref >60)
Glucose, Bld: 112 mg/dL — ABNORMAL HIGH (ref 70–99)
Potassium: 4 mmol/L (ref 3.5–5.1)
Sodium: 140 mmol/L (ref 135–145)
Total Bilirubin: 0.5 mg/dL (ref 0.3–1.2)
Total Protein: 6.8 g/dL (ref 6.5–8.1)

## 2022-05-20 LAB — CBC WITH DIFFERENTIAL/PLATELET
Abs Immature Granulocytes: 0.02 10*3/uL (ref 0.00–0.07)
Basophils Absolute: 0 10*3/uL (ref 0.0–0.1)
Basophils Relative: 0 %
Eosinophils Absolute: 0.1 10*3/uL (ref 0.0–0.5)
Eosinophils Relative: 1 %
HCT: 41.4 % (ref 39.0–52.0)
Hemoglobin: 14.2 g/dL (ref 13.0–17.0)
Immature Granulocytes: 0 %
Lymphocytes Relative: 32 %
Lymphs Abs: 2.3 10*3/uL (ref 0.7–4.0)
MCH: 34.5 pg — ABNORMAL HIGH (ref 26.0–34.0)
MCHC: 34.3 g/dL (ref 30.0–36.0)
MCV: 100.5 fL — ABNORMAL HIGH (ref 80.0–100.0)
Monocytes Absolute: 0.6 10*3/uL (ref 0.1–1.0)
Monocytes Relative: 9 %
Neutro Abs: 4.2 10*3/uL (ref 1.7–7.7)
Neutrophils Relative %: 58 %
Platelets: 104 10*3/uL — ABNORMAL LOW (ref 150–400)
RBC: 4.12 MIL/uL — ABNORMAL LOW (ref 4.22–5.81)
RDW: 13.4 % (ref 11.5–15.5)
WBC: 7.2 10*3/uL (ref 4.0–10.5)
nRBC: 0 % (ref 0.0–0.2)

## 2022-05-20 MED ORDER — DIPHENHYDRAMINE HCL 25 MG PO CAPS
50.0000 mg | ORAL_CAPSULE | Freq: Once | ORAL | Status: AC
  Administered 2022-05-20: 50 mg via ORAL
  Filled 2022-05-20: qty 2

## 2022-05-20 MED ORDER — DARATUMUMAB-HYALURONIDASE-FIHJ 1800-30000 MG-UT/15ML ~~LOC~~ SOLN
1800.0000 mg | Freq: Once | SUBCUTANEOUS | Status: AC
  Administered 2022-05-20: 1800 mg via SUBCUTANEOUS
  Filled 2022-05-20: qty 15

## 2022-05-20 MED ORDER — SODIUM CHLORIDE 0.9 % IV SOLN
Freq: Once | INTRAVENOUS | Status: DC
  Filled 2022-05-20: qty 250

## 2022-05-20 MED ORDER — ACETAMINOPHEN 325 MG PO TABS
650.0000 mg | ORAL_TABLET | Freq: Once | ORAL | Status: AC
  Administered 2022-05-20: 650 mg via ORAL
  Filled 2022-05-20: qty 2

## 2022-05-20 MED ORDER — DEXAMETHASONE 4 MG PO TABS
20.0000 mg | ORAL_TABLET | Freq: Once | ORAL | Status: AC
  Administered 2022-05-20: 20 mg via ORAL
  Filled 2022-05-20: qty 5

## 2022-05-20 MED ORDER — ACYCLOVIR 400 MG PO TABS: 400.0000 mg | ORAL_TABLET | Freq: Two times a day (BID) | ORAL | 2 refills | Status: DC

## 2022-05-20 NOTE — Assessment & Plan Note (Signed)
avoid nephrotoxin.  Encourage hydration. 

## 2022-05-20 NOTE — Progress Notes (Signed)
Hematology/Oncology Progress note [phone] [phone]      Patient Care Team: Tonia Ghent, MD as PCP - General (Family Medicine) Earlie Server, MD as Consulting Physician (Oncology) Beather Arbour Lilyan Punt, MD as Referring Physician (Hematology and Oncology) Diannia Ruder, MD as Referring Physician (Hematology and Oncology)  REASON FOR VISIT Follow up for multiple myeloma.   ASSESSMENT & PLAN:  Multiple myeloma (HCC) Kappa Light chain multiple myeloma beta 2 microglobulin 5 and normal albumin.  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  06/04/2021 5% plasma cells -August 2022-February 2023, patient completed 7 cycles of daratumumab/Pomalyst/dexamethasone. 11/20/2021, status post bone marrow biopsy which showed 1% of plasma cells.  Likely he has minimal residual disease. Currently on daratumumab maintenance. Labs are reviewed and discussed with patient. Slight increase of light chain ratio due to decreased uninvolved lamda.  Proceed with daratumumab today  Thrombocytopenia ([place]) Chemotherapy induced. Stable. Monitor counts.   Encounter for antineoplastic chemotherapy Chemotherapy as listed above.  Stage 3a chronic kidney disease ([place]) avoid nephrotoxin.  Encourage hydration.  Hypoglobulinemia Plan IVIG x 1 this week patient is going on a Cruise Trip.    Metastasis to bone ([place]) Majority of his lucencies were not hypermetabolic on previous PET scan Xgeva monthly.resume Xgeva at next visit.  Continue calcium and vitamin D supplementation.   No orders of the defined types were placed in this encounter.  Follow up in 4 weeks, Lab MD Gwen Her All questions were answered. The patient knows to call the clinic with any problems, questions or concerns.  Earlie Server, MD, PhD [facility]  Hematology Oncology 05/20/2022   HISTORY OF PRESENTING ILLNESS:  Luis Burke is a  66 y.o.  male with PMH listed below presents for follow up of multiple myeloma.  Oncology History  Multiple myeloma ([place])  08/19/2018 Bone Marrow Biopsy   08/19/2018 bone marrow biopsy showed hypercellular marrow 80%, involved by plasma cell neoplasm up to 95%.  Consistent with plasma cell myeloma. Myeloma FISH  gain of gain of CEP12   08/25/2018 Imaging   PET scan showed no definite hypermetabolic bone disease but CT findings are highly suspicious for numerous small myelomatous lesions involving the spine, sternum and scattered ribs.   08/26/2018 Initial Diagnosis   Multiple myeloma  # 07/27/2018 multiple myeloma panel showed M protein of 0.1, IgG 662, IgA 49, IgM 7. 08/10/2018, free light chain ratio showed extremely high level of kappa free light chain 10,183, with a kappa lambda light chain ratio of 1414.31,LDH 164, Beta-2 microglobulin 5     08/28/2018 - 03/17/2019 Chemotherapy   RVD x 9     04/23/2019 Bone Marrow Transplant   autologous stem cell bone marrow transplant at [facility].  He received preparative regimen with melphalan 200 mg/m on 04/22/2019 followed by autologous stem cell infusion on 04/23/2019. Transplant course was complicated with febrile neutropenia grade 3, treated with vancomycin and cephapirin until engraftment on 05/06/2019. Patient also had engraftment syndrome and had to be started on Solu-Medrol 25 mg twice daily on 05/05/2019.  Transition to Medrol Dosepak on 05/07/2019 to complete steroid taper as outpatient.  re-vaccination 12 months post transplant   08/10/2019 - 05/2022 Chemotherapy    Ixazomib 68m on D1/D8/D15 out of 28 day cycle       07/12/2020 Imaging   [facility]  Lucent lesion in the L4 spinous process measuring 4 mm which is  nonspecific. Consider confirmation of marrow replacing process with MRI.  2.  Incompletely characterized renal cysts     07/28/2020 Bone Marrow Biopsy    bone marrow normocellular marrow with polytypic plasmacytosis, 3 to 8%   Patient is in remission.  Cytogenetics negative for trisomy 12.  Negative myeloma FISH panel.   08/05/2020 Imaging   MRI lumbar spine with and without contrast was done at [facility] showed no evidence of spinal metastasis.  Lumbar spondylosis is most pronounced at L5-S1 where there is severe right and moderate left neural foraminal stenosis. 08/11/2020, x-ray cervical spine complete with flexion and extension showed The cervical spine is visualized from C1 to the top of T1 on the lateral  view.  Reversal of the normal cervical lordosis with a focal kyphosis centered at the C5-C6 intervertebral level. Trace anterolisthesis of C4 onto C5. Trace retrolisthesis of C6 on C7. No evidence of dynamic instability is noted on  the flexion or extension views.   No prevertebral soft tissue swelling. Cervical vertebral body heights are maintained. Multilevel degenerative changes of the cervical spine including discogenic disease, most pronounced at C5-C6 and C6-C7, and facet arthropathy most pronounced at C4-C5. Multilevel mild to moderate left neural foraminal osseous narrowing of the mid to lower cervical vertebrae, possibly centrally/artifactual by patient positioning.     05/15/2021 Bone Marrow Biopsy   Bone marrow biopsy showed slightly hypercellular bone marrow for age with slight plasmacytosis.  5% plasma cells with kappa light chain restriction.  Cytogenetics normal.  Myeloma FISH negative I discussed with [place]. we both feel that his bone marrow biopsy result reflects early relapse. Recommend stop Ixazomib. Switch to second line treatment with Daratumumab Pom[place]st Dexamethasone   05/31/2021 -  Chemotherapy   Patient is on Treatment Plan : MYELOMA Daratumumab + Pomalidomide + Dexamethasone q28d x 7 cycles     09/21/2021 Imaging    PET scan showed no evidence of suspicious or new  hypermetabolism, scattered small lucent foci seen previously in the sternum and the spine are less prominent today and have resolved in some regions.  Aortic atherosclerosis.   11/18/2021 Imaging   MRI cervical spine with and without contrast showed no multiple myeloma lesion or marrow edema identified in the cervical spine.  Abnormal dural thickening and a/or abnormal epidural space at C4/C5 levels.  This is nonspecific.  ER physician discussed the case with neurosurgery Dr. Cari Caraway who feels that this is more likely degenerative disc disease.   11/20/2021 Bone Marrow Biopsy   bone marrow biopsy which showed 1% of plasma cells. The plasma cells generally display polyclonal staining pattern for kappa and lambda light chains although a few very small clusters appear kappa light chain restricted.  The findings are very limited but most suggestive of minimal residual plasma cell neoplasm especially in the presence of monoclonal protein with kappa light chain specificity. Normal cytogenetics and myeloma FISH   12/20/2021 -  Chemotherapy   Daratumumab monthly maintenance  # I discussed with patient's [facility] oncologist Dr. Maylene Roes.  We have agreed upon de-escalating his regimen to Daratumumab monthly maintenance for now. Discontinue pomalyst.     Metastasis to bone ([place])   #01/14/2022, received IVIG 400 mg/kg x1   INTERVAL HISTORY Luis Burke is a 66 y.o. male who has above history reviewed by me today for follow-up multiple myeloma.  Denies any nausea vomiting diarrhea today He has completed his dental work.  Still has some soreness.  No other new complains.    Review of Systems  Constitutional:  Negative for appetite change, chills, fatigue, fever and unexpected weight change.  HENT:   Negative for hearing loss.   Eyes:  Negative for eye problems and icterus.  Respiratory:  Negative for chest tightness, cough and shortness of breath.   Cardiovascular:  Negative for chest pain and leg swelling.   Gastrointestinal:  Negative for abdominal distention and abdominal pain.  Endocrine: Negative for hot flashes.  Genitourinary:  Negative for difficulty urinating, dysuria and frequency.   Musculoskeletal:  Positive for back pain and neck pain. Negative for arthralgias.  Skin:  Negative for itching and rash.  Neurological:  Negative for light-headedness and numbness.  Hematological:  Negative for adenopathy. Does not bruise/bleed easily.  Psychiatric/Behavioral:  Negative for confusion.     MEDICAL HISTORY:  Past Medical History:  Diagnosis Date   AKI (acute kidney injury) ([place]) 01/06/2019   Anemia    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[place])    autologous stem cell bone marrow transplant on 04/23/2019.   Fatty liver    on u/s 07/2018   Hyperlipidemia 03/2004   Hypertension 1994   Multiple myeloma ([place])    Snores     SURGICAL HISTORY: Past Surgical History:  Procedure Laterality Date   CATARACT EXTRACTION Left 10/10/2021   CATARACT EXTRACTION W/PHACO Right 03/04/2018   Procedure: CATARACT EXTRACTION PHACO AND INTRAOCULAR LENS PLACEMENT ([place])  RIGHT TORIC;  Surgeon: Leandrew Koyanagi, MD;  Location: [place];  Service: Ophthalmology;  Laterality: Right;  [place] no Toric Lens 1:45 5.3   CATARACT EXTRACTION W/PHACO Left 10/10/2021   Procedure: CATARACT EXTRACTION PHACO AND INTRAOCULAR LENS PLACEMENT ([place]) LEFT;  Surgeon: Leandrew Koyanagi, MD;  Location: [place];  Service: Ophthalmology;  Laterality: Left;  5.16 1:03.8   COLONOSCOPY WITH PROPOFOL N/A 07/20/2018   Procedure: COLONOSCOPY WITH PROPOFOL;  Surgeon: Jonathon Bellows, MD;  Location[facility] ENDOSCOPY;  Service: Gastroenterology;  Laterality: N/A;   ESOPHAGOGASTRODUODENOSCOPY (EGD) WITH PROPOFOL N/A 07/20/2018   Procedure: ESOPHAGOGASTRODUODENOSCOPY (EGD) WITH PROPOFOL;  Surgeon: Jonathon Bellows, MD;  Location: [facility] ENDOSCOPY;  Service: Gastroenterology;  Laterality: N/A;   EYE SURGERY Right    HERNIA REPAIR      L Lap herniorraphy  04/2000   Left wrist ganglionectomy      SOCIAL HISTORY: Social History   Socioeconomic History   Marital status: Married    Spouse name: Not on file   Number of children: 1   Years of education: Not on file   Highest education level: Not on file  Occupational History   Occupation: [employer]  Tobacco Use   Smoking status: Never   Smokeless tobacco: Never   Tobacco comments:    Occassionally  Vaping Use   Vaping Use: Never used  Substance and Sexual Activity   Alcohol use: Not Currently    Alcohol/week: 3.0 standard drinks of alcohol    Types: 3 Cans of beer per week   Drug use: No   Sexual activity: Not on file  Other Topics Concern   Not on file  Social History Narrative   Divorced 2009, married 2021/06/09.     His daughter died 4 days after giving birth to the patient's granddaughter   Has joint custody of his deceased daughter's child   Worked at [employer] is [place], retired 2020.     Social Determinants of Health   Financial Resource Strain: Not on file  Food Insecurity:  Not on file  Transportation Needs: Not on file  Physical Activity: Not on file  Stress: Not on file  Social Connections: Not on file  Intimate Partner Violence: Not on file    FAMILY HISTORY: Family History  Problem Relation Age of Onset   Hypertension Mother    Diabetes Mother    Heart disease Mother        CAD   Dementia Mother    Prostate cancer Father    Hypertension Father    Heart disease Father        MI 02/03   Colon cancer Father    Hypertension Sister    Hypertension Sister    Hypertension Sister     ALLERGIES:  is allergic to bactrim [sulfamethoxazole-trimethoprim], clonidine derivatives, and tadalafil.  MEDICATIONS:  Current Outpatient Medications  Medication Sig Dispense Refill   acetaminophen (TYLENOL) 325 MG tablet Take 2 tablets (650 mg total) by mouth See admin instructions. 1 hour prior to chemotherapy treatments 30 tablet 0    acetaminophen-codeine (TYLENOL #3) 300-30 MG tablet Take 1 tablet by mouth every 8 (eight) hours as needed for moderate pain. 10 tablet 0   amLODipine (NORVASC) 5 MG tablet TAKE 2 TABLETS BY MOUTH EVERY DAY 180 tablet 1   ASPIRIN 81 PO Take 81 mg by mouth daily.     B Complex Vitamins (VITAMIN B COMPLEX PO) Take 1 Dose by mouth daily.     co-enzyme Q-10 50 MG capsule Take 50 mg by mouth daily.     [employer] VITAMIN B12 1000 MCG tablet TAKE 1 TABLET BY MOUTH EVERY DAY 90 tablet 3   Denosumab (XGEVA [place]) Inject into the skin every 30 (thirty) days. Last rcvd 09/27/21     diphenhydrAMINE (BENADRYL) 50 MG tablet Take 1 tablet (50 mg total) by mouth See admin instructions. Take 1 tablet 1 hour prior to chemotherapy treatments. 30 tablet 0   hydrALAZINE (APRESOLINE) 10 MG tablet TAKE 1 TABLET(10 MG) BY MOUTH THREE TIMES DAILY 270 tablet 1   lisinopril (ZESTRIL) 20 MG tablet TAKE 1 TABLET BY MOUTH EVERY DAY 90 tablet 2   magnesium chloride (SLOW-MAG) 64 MG TBEC SR tablet Take 1 tablet (64 mg total) by mouth daily. 60 tablet 0   metoprolol succinate (TOPROL-XL) 50 MG 24 hr tablet TAKE 4 TABLETS BY MOUTH EVERY DAY WITH OR IMMEDIATELY FOLLOWING A MEAL 360 tablet 2   montelukast (SINGULAIR) 10 MG tablet Take 1 tablet (10 mg total) by mouth See admin instructions. Take 1day prior to chemotherapy 30 tablet 1   ondansetron (ZOFRAN) 8 MG tablet Take 1 tablet (8 mg total) by mouth every 8 (eight) hours as needed. for nausea 30 tablet 1   pravastatin (PRAVACHOL) 10 MG tablet TAKE 1 TABLET BY MOUTH EVERY DAY 90 tablet 3   prochlorperazine (COMPAZINE) 10 MG tablet      sildenafil (REVATIO) 20 MG tablet Take 1-5 tablets (20-100 mg total) by mouth daily as needed. 50 tablet 12   zinc gluconate 50 MG tablet Take 50 mg by mouth daily.     acyclovir (ZOVIRAX) 400 MG tablet Take 1 tablet (400 mg total) by mouth 2 (two) times daily. 60 tablet 2   chlorhexidine (PERIDEX) 0.12 % solution Use as directed 15 mLs in the mouth or  throat 2 (two) times daily. (Patient not taking: Reported on 03/25/2022) 473 mL 1   dexamethasone (DECADRON) 4 MG tablet Take 5 tablets (20 mg total) by mouth once a week. Take at least 1  hour prior to infusion appt. Take with food. To start on 06/07/21. (Patient not taking: Reported on 03/25/2022) 60 tablet 0   No current facility-administered medications for this visit.   Facility-Administered Medications Ordered in Other Visits  Medication Dose Route Frequency Provider Last Rate Last Admin   0.9 %  sodium chloride infusion   Intravenous Once Earlie Server, MD         PHYSICAL EXAMINATION: ECOG PERFORMANCE STATUS: 1 - Symptomatic but completely ambulatory Vitals:   05/20/22 0907  BP: 133/87  Pulse: 74  Resp: 20  Temp: 98.7 F (37.1 C)  SpO2: 100%   Filed Weights   05/20/22 0907  Weight: 210 lb (95.3 kg)    Physical Exam Constitutional:      General: He is not in acute distress. HENT:     Head: Normocephalic and atraumatic.  Eyes:     General: No scleral icterus. Cardiovascular:     Rate and Rhythm: Normal rate and regular rhythm.     Heart sounds: Normal heart sounds.  Pulmonary:     Effort: Pulmonary effort is normal. No respiratory distress.     Breath sounds: No wheezing.  Abdominal:     General: Bowel sounds are normal. There is no distension.     Palpations: Abdomen is soft.  Musculoskeletal:        General: No deformity. Normal range of motion.     Cervical back: Normal range of motion and neck supple.  Skin:    General: Skin is warm and dry.     Findings: No erythema or rash.  Neurological:     Mental Status: He is alert and oriented to person, place, and time. Mental status is at baseline.     Cranial Nerves: No cranial nerve deficit.     Coordination: Coordination normal.  Psychiatric:        Mood and Affect: Mood normal.      LABORATORY DATA:  I have reviewed the data as listed Lab Results  Component Value Date   WBC 7.2 05/20/2022   HGB 14.2  05/20/2022   HCT 41.4 05/20/2022   MCV 100.5 (H) 05/20/2022   PLT 104 (L) 05/20/2022   Recent Labs    03/25/22 0849 04/22/22 0843 05/20/22 0844  NA 140 138 140  K 4.1 3.9 4.0  CL 108 107 108  CO2 _0 GLUCOSE 103* 104* 112*  BUN _1 CREATININE 1.39* 1.35* 1.50*  CALCIUM 9.0 9.2 9.6  GFRNONAA 56* 58* 51*  PROT 7.0 6.8 6.8  ALBUMIN 4.3 4.3 4.5  AST _2 ALT _3 ALKPHOS 65 58 62  BILITOT 0.7 0.7 0.5    Iron/TIBC/Ferritin/ %Sat    Component Value Date/Time   IRON 74 07/01/2018 0944   TIBC 250 07/01/2018 0944   FERRITIN 357 07/01/2018 0944   IRONPCTSAT 30 07/01/2018 0944    Lab Results  Component Value Date   TOTALPROTELP 6.2 04/22/2022   ALBUMINELP 4.0 03/08/2021   A1GS 0.2 03/08/2021   A2GS 0.7 03/08/2021   BETS 1.1 03/08/2021   GAMS 1.1 03/08/2021   MSPIKE Not Observed 03/08/2021   SPEI Comment 03/08/2021   Lab Results  Component Value Date   KPAFRELGTCHN 10.5 04/22/2022   LAMBDASER 4.6 (L) 04/22/2022   KAPLAMBRATIO 2.28 (H) 04/22/2022    RADIOGRAPHIC STUDIES: I have personally reviewed the radiological images as listed and agreed with the findings in the report. No results found.

## 2022-05-20 NOTE — Assessment & Plan Note (Signed)
Plan IVIG x 1 this week patient is going on a Cruise Trip.

## 2022-05-20 NOTE — Assessment & Plan Note (Signed)
Chemotherapy as listed above. 

## 2022-05-20 NOTE — Assessment & Plan Note (Signed)
Majority of his lucencies were not hypermetabolic on previous PET scan Xgeva monthly.resume Xgeva at next visit.  Continue calcium and vitamin D supplementation.

## 2022-05-20 NOTE — Assessment & Plan Note (Addendum)
Kappa Light chain multiple myeloma beta 2 microglobulin 5 and normal albumin.  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  06/04/2021 5% plasma cells -August 2022-February 2023, patient completed 7 cycles of daratumumab/Pomalyst/dexamethasone. 11/20/2021, status post bone marrow biopsy which showed 1% of plasma cells.  Likely he has minimal residual disease. Currently on daratumumab maintenance. Labs are reviewed and discussed with patient. Slight increase of light chain ratio due to decreased uninvolved lamda.  Proceed with daratumumab today

## 2022-05-20 NOTE — Patient Instructions (Signed)
[facility]  Discharge Instructions: Thank you for choosing [facility] to provide your oncology and hematology care.  If you have a lab appointment with the Cancer Center, please go directly to the Cancer Center and check in at the registration area.  Wear comfortable clothing and clothing appropriate for easy access to any Portacath or PICC line.   We strive to give you quality time with your provider. You may need to reschedule your appointment if you arrive late (15 or more minutes).  Arriving late affects you and other patients whose appointments are after yours.  Also, if you miss three or more appointments without notifying the office, you may be dismissed from the clinic at the provider's discretion.      For prescription refill requests, have your pharmacy contact our office and allow 72 hours for refills to be completed.    Today you received the following chemotherapy and/or immunotherapy agents Darzalex      To help prevent nausea and vomiting after your treatment, we encourage you to take your nausea medication as directed.  BELOW ARE SYMPTOMS THAT SHOULD BE REPORTED IMMEDIATELY: *FEVER GREATER THAN 100.4 F (38 C) OR HIGHER *CHILLS OR SWEATING *NAUSEA AND VOMITING THAT IS NOT CONTROLLED WITH YOUR NAUSEA MEDICATION *UNUSUAL SHORTNESS OF BREATH *UNUSUAL BRUISING OR BLEEDING *URINARY PROBLEMS (pain or burning when urinating, or frequent urination) *BOWEL PROBLEMS (unusual diarrhea, constipation, pain near the anus) TENDERNESS IN MOUTH AND THROAT WITH OR WITHOUT PRESENCE OF ULCERS (sore throat, sores in mouth, or a toothache) UNUSUAL RASH, SWELLING OR PAIN  UNUSUAL VAGINAL DISCHARGE OR ITCHING   Items with * indicate a potential emergency and should be followed up as soon as possible or go to the Emergency Department if any problems should occur.  Please show the CHEMOTHERAPY ALERT CARD or IMMUNOTHERAPY ALERT CARD at check-in to  the Emergency Department and triage nurse.  Should you have questions after your visit or need to cancel or reschedule your appointment, please contact [facility]  [phone] and follow the prompts.  Office hours are 8:00 a.m. to 4:30 p.m. Monday - Friday. Please note that voicemails left after 4:00 p.m. may not be returned until the following business day.  We are closed weekends and major holidays. You have access to a nurse at all times for urgent questions. Please call the main number to the clinic [phone] and follow the prompts.  For any non-urgent questions, you may also contact your provider using MyChart. We now offer e-Visits for anyone 18 and older to request care online for non-urgent symptoms. For details visit [email or web address].   Also download the MyChart app! Go to the app store, search "MyChart", open the app, select [facility], and log in with your MyChart username and password.  Masks are optional in the cancer centers. If you would like for your care team to wear a mask while they are taking care of you, please let them know. For doctor visits, patients may have with them one support person who is at least 66 years old. At this time, visitors are not allowed in the infusion area.   

## 2022-05-20 NOTE — Assessment & Plan Note (Signed)
Chemotherapy induced. Stable. Monitor counts.  

## 2022-05-21 ENCOUNTER — Telehealth: Payer: Self-pay | Admitting: Oncology

## 2022-05-21 LAB — KAPPA/LAMBDA LIGHT CHAINS
Kappa free light chain: 10.5 mg/L (ref 3.3–19.4)
Kappa, lambda light chain ratio: 1.42 (ref 0.26–1.65)
Lambda free light chains: 7.4 mg/L (ref 5.7–26.3)

## 2022-05-21 NOTE — Telephone Encounter (Signed)
pt called in to have treatment for thursday r/s, unable to make appt time. Communicated that he would be able to do Wed. Unfortunately there are no avail chairs for treatment that day. He can do week of 21st..please advise

## 2022-05-23 ENCOUNTER — Inpatient Hospital Stay: Payer: Medicare Other

## 2022-05-23 LAB — MULTIPLE MYELOMA PANEL, SERUM
Albumin SerPl Elph-Mcnc: 4.1 g/dL (ref 2.9–4.4)
Albumin/Glob SerPl: 1.8 — ABNORMAL HIGH (ref 0.7–1.7)
Alpha 1: 0.2 g/dL (ref 0.0–0.4)
Alpha2 Glob SerPl Elph-Mcnc: 0.7 g/dL (ref 0.4–1.0)
B-Globulin SerPl Elph-Mcnc: 0.9 g/dL (ref 0.7–1.3)
Gamma Glob SerPl Elph-Mcnc: 0.5 g/dL (ref 0.4–1.8)
Globulin, Total: 2.3 g/dL (ref 2.2–3.9)
IgA: 78 mg/dL (ref 61–437)
IgG (Immunoglobin G), Serum: 471 mg/dL — ABNORMAL LOW (ref 603–1613)
IgM (Immunoglobulin M), Srm: 6 mg/dL — ABNORMAL LOW (ref 20–172)
Total Protein ELP: 6.4 g/dL (ref 6.0–8.5)

## 2022-06-04 ENCOUNTER — Inpatient Hospital Stay: Payer: Medicare Other

## 2022-06-04 ENCOUNTER — Other Ambulatory Visit: Payer: Self-pay | Admitting: Oncology

## 2022-06-04 VITALS — BP 123/77 | HR 57 | Temp 96.7°F | Resp 20 | Wt 210.0 lb

## 2022-06-04 DIAGNOSIS — N179 Acute kidney failure, unspecified: Secondary | ICD-10-CM

## 2022-06-04 DIAGNOSIS — Z5112 Encounter for antineoplastic immunotherapy: Secondary | ICD-10-CM | POA: Diagnosis not present

## 2022-06-04 MED ORDER — DEXTROSE 5 % IV SOLN
INTRAVENOUS | Status: DC
  Filled 2022-06-04: qty 250

## 2022-06-04 MED ORDER — ACETAMINOPHEN 325 MG PO TABS
650.0000 mg | ORAL_TABLET | Freq: Every day | ORAL | Status: DC | PRN
  Administered 2022-06-04: 650 mg via ORAL
  Filled 2022-06-04: qty 2

## 2022-06-04 MED ORDER — DIPHENHYDRAMINE HCL 25 MG PO CAPS
50.0000 mg | ORAL_CAPSULE | Freq: Every day | ORAL | Status: DC | PRN
  Administered 2022-06-04: 50 mg via ORAL
  Filled 2022-06-04: qty 2

## 2022-06-04 MED ORDER — IMMUNE GLOBULIN (HUMAN) 5 GM/50ML IV SOLN
35.0000 g | Freq: Once | INTRAVENOUS | Status: AC
  Administered 2022-06-04: 35 g via INTRAVENOUS
  Filled 2022-06-04: qty 200

## 2022-06-04 NOTE — Patient Instructions (Signed)
[facility]  Discharge Instructions: Thank you for choosing [facility] to provide your oncology and hematology care.  If you have a lab appointment with the Cancer Center, please go directly to the Cancer Center and check in at the registration area.  Wear comfortable clothing and clothing appropriate for easy access to any Portacath or PICC line.   We strive to give you quality time with your provider. You may need to reschedule your appointment if you arrive late (15 or more minutes).  Arriving late affects you and other patients whose appointments are after yours.  Also, if you miss three or more appointments without notifying the office, you may be dismissed from the clinic at the provider's discretion.      For prescription refill requests, have your pharmacy contact our office and allow 72 hours for refills to be completed.       To help prevent nausea and vomiting after your treatment, we encourage you to take your nausea medication as directed.  BELOW ARE SYMPTOMS THAT SHOULD BE REPORTED IMMEDIATELY: *FEVER GREATER THAN 100.4 F (38 C) OR HIGHER *CHILLS OR SWEATING *NAUSEA AND VOMITING THAT IS NOT CONTROLLED WITH YOUR NAUSEA MEDICATION *UNUSUAL SHORTNESS OF BREATH *UNUSUAL BRUISING OR BLEEDING *URINARY PROBLEMS (pain or burning when urinating, or frequent urination) *BOWEL PROBLEMS (unusual diarrhea, constipation, pain near the anus) TENDERNESS IN MOUTH AND THROAT WITH OR WITHOUT PRESENCE OF ULCERS (sore throat, sores in mouth, or a toothache) UNUSUAL RASH, SWELLING OR PAIN  UNUSUAL VAGINAL DISCHARGE OR ITCHING   Items with * indicate a potential emergency and should be followed up as soon as possible or go to the Emergency Department if any problems should occur.  Please show the CHEMOTHERAPY ALERT CARD or IMMUNOTHERAPY ALERT CARD at check-in to the Emergency Department and triage nurse.  Should you have questions after your  visit or need to cancel or reschedule your appointment, please contact [facility]  [phone] and follow the prompts.  Office hours are 8:00 a.m. to 4:30 p.m. Monday - Friday. Please note that voicemails left after 4:00 p.m. may not be returned until the following business day.  We are closed weekends and major holidays. You have access to a nurse at all times for urgent questions. Please call the main number to the clinic [phone] and follow the prompts.  For any non-urgent questions, you may also contact your provider using MyChart. We now offer e-Visits for anyone 18 and older to request care online for non-urgent symptoms. For details visit [email or web address].   Also download the MyChart app! Go to the app store, search "MyChart", open the app, select [facility], and log in with your MyChart username and password.  Masks are optional in the cancer centers. If you would like for your care team to wear a mask while they are taking care of you, please let them know. For doctor visits, patients may have with them one support person who is at least 66 years old. At this time, visitors are not allowed in the infusion area.   

## 2022-06-14 ENCOUNTER — Ambulatory Visit
Admission: RE | Admit: 2022-06-14 | Discharge: 2022-06-14 | Disposition: A | Discharge status: Home / Self Care | Payer: Medicare Other | Source: Ambulatory Visit | Attending: Hospice and Palliative Medicine | Admitting: Hospice and Palliative Medicine

## 2022-06-14 ENCOUNTER — Telehealth: Payer: Self-pay | Admitting: *Deleted

## 2022-06-14 ENCOUNTER — Inpatient Hospital Stay: Payer: Medicare Other | Attending: Oncology | Admitting: Hospice and Palliative Medicine

## 2022-06-14 VITALS — BP 149/96 | HR 82 | Temp 98.8°F | Resp 16 | Wt 207.0 lb

## 2022-06-14 DIAGNOSIS — K0889 Other specified disorders of teeth and supporting structures: Secondary | ICD-10-CM | POA: Insufficient documentation

## 2022-06-14 DIAGNOSIS — Z5112 Encounter for antineoplastic immunotherapy: Secondary | ICD-10-CM | POA: Insufficient documentation

## 2022-06-14 DIAGNOSIS — Z800 Family history of malignant neoplasm of digestive organs: Secondary | ICD-10-CM | POA: Insufficient documentation

## 2022-06-14 DIAGNOSIS — I129 Hypertensive chronic kidney disease with stage 1 through stage 4 chronic kidney disease, or unspecified chronic kidney disease: Secondary | ICD-10-CM | POA: Insufficient documentation

## 2022-06-14 DIAGNOSIS — N1831 Chronic kidney disease, stage 3a: Secondary | ICD-10-CM | POA: Diagnosis not present

## 2022-06-14 DIAGNOSIS — Z9484 Stem cells transplant status: Secondary | ICD-10-CM | POA: Insufficient documentation

## 2022-06-14 DIAGNOSIS — D6959 Other secondary thrombocytopenia: Secondary | ICD-10-CM | POA: Insufficient documentation

## 2022-06-14 DIAGNOSIS — C9000 Multiple myeloma not having achieved remission: Secondary | ICD-10-CM | POA: Insufficient documentation

## 2022-06-14 DIAGNOSIS — I700 Atherosclerosis of aorta: Secondary | ICD-10-CM | POA: Insufficient documentation

## 2022-06-14 MED ORDER — OXYCODONE HCL 5 MG PO TABS: 5.0000 mg | ORAL_TABLET | ORAL | 0 refills | Status: DC | PRN

## 2022-06-14 NOTE — Telephone Encounter (Signed)
Pt notified and agreeable to [facility]. Appointment scheduled.

## 2022-06-14 NOTE — Progress Notes (Signed)
Oklee add-on for tooth pain to 2nd left molar. He has been to dentist and had panoramic x-rays. States that his dentist has not found a source for the pain. He has placed a call to his endodontist, but has not heard back yet.

## 2022-06-14 NOTE — Telephone Encounter (Signed)
Patient called reporting that he is having bone pain in his face and teeth. He has seen his dentist and endodontist who tell him that there is nothing going on with his teeth or roots and there is nothing they can do for him. Patient states it has been 3 months since he has gotten [place] an is asking if that could be the reason he is having this bone pain and if he should get another dose . He asks what can be done for him. Pleas advise.  Of Note, Patient is scheduled for Dara and Xgeva 9/5. Last Delton See 03/25/22

## 2022-06-14 NOTE — Progress Notes (Addendum)
Symptom Management [place] at [facility] [phone] [phone]  Patient Care Team: Tonia Ghent, MD as PCP - General (Family Medicine) Earlie Server, MD as Consulting Physician (Oncology) Be[place]bour Lilyan Punt, MD as Referring Physician (Hematology and Oncology) Diannia Ruder, MD as Referring Physician (Hematology and Oncology)   NAME OF PATIENT: Luis Burke  [id number]  [birth year]   DATE OF VISIT: 06/14/22  REASON FOR CONSULT: Luis Burke is a 66 y.o. male with multiple medical problems including CKD stage III, multiple myeloma.  He is on treatment with daratumumab and Xgeva.  INTERVAL HISTORY: Patient presents [facility] today for evaluation of dental pain.  He reports having about 2 months of sharp/severe pain in the bottom left molar.  He has had dental work done on that in the past.  He has been seen recently by his dentist who told him that he could not find any acute cause for the pain.  Dentist performed Panorex.  Patient has intermittently required Tylenol 3 for pain but pain is remained severe, particularly at night.  Denies any neurologic complaints. Denies recent fevers or illnesses. Denies any easy bleeding or bruising. Reports good appetite and denies weight loss. Denies chest pain. Denies any nausea, vomiting, constipation, or diarrhea. Denies urinary complaints. Patient offers no further specific complaints today.   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   Fatty liver    on u/s 07/2018   Hyperlipidemia 03/2004   Hypertension 1994   Multiple myeloma ([place])    Snores     PAST SURGICAL HISTORY:  Past Surgical History:  Procedure Laterality Date   CATARACT EXTRACTION Left 10/10/2021   CATARACT EXTRACTION W/PHACO Right 03/04/2018    Procedure: CATARACT EXTRACTION PHACO AND INTRAOCULAR LENS PLACEMENT ([place])  RIGHT TORIC;  Surgeon: Leandrew Koyanagi, MD;  Location: [place];  Service: Ophthalmology;  Laterality: Right;  [place] no Toric Lens 1:45 5.3   CATARACT EXTRACTION W/PHACO Left 10/10/2021   Procedure: CATARACT EXTRACTION PHACO AND INTRAOCULAR LENS PLACEMENT ([place]) LEFT;  Surgeon: Leandrew Koyanagi, MD;  Location: [place];  Service: Ophthalmology;  Laterality: Left;  5.16 1:03.8   COLONOSCOPY WITH PROPOFOL N/A 07/20/2018   Procedure: COLONOSCOPY WITH PROPOFOL;  Surgeon: Jonathon Bellows, MD;  Location: [facility] ENDOSCOPY;  Service: Gastroenterology;  Laterality: N/A;   ESOPHAGOGASTRODUODENOSCOPY (EGD) WITH PROPOFOL N/A 07/20/2018   Procedure: ESOPHAGOGASTRODUODENOSCOPY (EGD) WITH PROPOFOL;  Surgeon: Jonathon Bellows, MD;  Location: [facility] ENDOSCOPY;  Service: Gastroenterology;  Laterality: N/A;   EYE SURGERY Right    HERNIA REPAIR     L Lap herniorraphy  04/2000   Left wrist ganglionectomy      HEMATOLOGY/ONCOLOGY HISTORY:  Oncology History  Multiple myeloma ([place])  08/19/2018 Bone Marrow Biopsy   08/19/2018 bone marrow biopsy showed hypercellular marrow 80%, involved by plasma cell neoplasm up to 95%.  Consistent with plasma cell myeloma. Myeloma FISH  gain of gain of CEP12   08/25/2018 Imaging   PET scan showed no definite hypermetabolic bone disease but CT findings are highly suspicious for numerous small myelomatous lesions involving the spine, sternum and scattered ribs.   08/26/2018 Initial Diagnosis   Multiple myeloma  # 07/27/2018 multiple myeloma panel showed M protein of 0.1, IgG 662, IgA 49, IgM 7. 08/10/2018, free light chain ratio showed extremely  high level of kappa free light chain 10,183, with a kappa lambda light chain ratio of 1414.31,LDH 164, Beta-2 microglobulin 5     08/28/2018 - 03/17/2019 Chemotherapy   RVD x 9     04/23/2019 Bone Marrow Transplant   autologous stem cell bone  marrow transplant at [facility].  He received preparative regimen with melphalan 200 mg/m on 04/22/2019 followed by autologous stem cell infusion on 04/23/2019. Transplant course was complicated with febrile neutropenia grade 3, treated with vancomycin and cephapirin until engraftment on 05/06/2019. Patient also had engraftment syndrome and had to be started on Solu-Medrol 25 mg twice daily on 05/05/2019.  Transition to Medrol Dosepak on 05/07/2019 to complete steroid taper as outpatient.  re-vaccination 12 months post transplant   08/10/2019 - 05/2022 Chemotherapy    Ixazomib 19m on D1/D8/D15 out of 28 day cycle       07/12/2020 Imaging   [facility]  Lucent lesion in the L4 spinous process measuring 4 mm which is  nonspecific. Consider confirmation of marrow replacing process with MRI.  2.  Incompletely characterized renal cysts    07/28/2020 Bone Marrow Biopsy    bone marrow normocellular marrow with polytypic plasmacytosis, 3 to 8%   Patient is in remission.  Cytogenetics negative for trisomy 12.  Negative myeloma FISH panel.   08/05/2020 Imaging   [facility].  Lumbar spondylosis is most pronounced at L5-S1 where there is severe right and moderate left neural foraminal stenosis. 08/11/2020, x-ray cervical spine complete with flexion and extension showed The cervical spine is visualized from C1 to the top of T1 on the lateral  view.  Reversal of the normal cervical lordosis with a focal kyphosis centered at the C5-C6 intervertebral level. Trace anterolisthesis of C4 onto C5. Trace retrolisthesis of C6 on C7. No evidence of dynamic instability is noted on  the flexion or extension views.   No prevertebral soft tissue swelling. Cervical vertebral body heights are maintained. Multilevel degenerative changes of the cervical spine including discogenic disease, most pronounced at C5-C6 and  C6-C7, and facet arthropathy most pronounced at C4-C5. Multilevel mild to moderate left neural foraminal osseous narrowing of the mid to lower cervical vertebrae, possibly centrally/artifactual by patient positioning.     05/15/2021 Bone Marrow Biopsy   Bone marrow biopsy showed slightly hypercellular bone marrow for age with slight plasmacytosis.  5% plasma cells with kappa light chain restriction.  Cytogenetics normal.  Myeloma FISH negative I discussed with DTroy we both feel that his bone marrow biopsy result reflects early relapse. Recommend stop Ixazomib. Switch to second line treatment with Daratumumab [place] Dexamethasone   05/31/2021 -  Chemotherapy   Patient is on Treatment Plan : MYELOMA Daratumumab + Pomalidomide + Dexamethasone q28d x 7 cycles     09/21/2021 Imaging    PET scan showed no evidence of suspicious or new hypermetabolism, scattered small lucent foci seen previously in the sternum and the spine are less prominent today and have resolved in some regions.  Aortic atherosclerosis.   11/18/2021 Imaging   MRI cervical spine with and without contrast showed no multiple myeloma lesion or marrow edema identified in the cervical spine.  Abnormal dural thickening and a/or abnormal epidural space at C4/C5 levels.  This is nonspecific.  ER physician discussed the case with neurosurgery Dr. YCari Carawaywho feels that this is more likely degenerative disc disease.  11/20/2021 Bone Marrow Biopsy   bone marrow biopsy which showed 1% of plasma cells. The plasma cells generally display polyclonal staining pattern for kappa and lambda light chains although a few very small clusters appear kappa light chain restricted.  The findings are very limited but most suggestive of minimal residual plasma cell neoplasm especially in the presence of monoclonal protein with kappa light chain specificity. Normal cytogenetics and myeloma FISH   12/20/2021 -  Chemotherapy   Daratumumab monthly  maintenance  # I discussed with patient's [facility] oncologist Dr. Maylene Roes.  We have agreed upon de-escalating his regimen to Daratumumab monthly maintenance for now. Discontinue pomalyst.     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  MEDICATIONS:  Current Outpatient Medications  Medication Sig Dispense Refill   acetaminophen (TYLENOL) 325 MG tablet Take 2 tablets (650 mg total) by mouth See admin instructions. 1 hour prior to chemotherapy treatments 30 tablet 0   acetaminophen-codeine (TYLENOL #3) 300-30 MG tablet Take 1 tablet by mouth every 8 (eight) hours as needed for moderate pain. 10 tablet 0   acyclovir (ZOVIRAX) 400 MG tablet Take 1 tablet (400 mg total) by mouth 2 (two) times daily. 60 tablet 2   amLODipine (NORVASC) 5 MG tablet TAKE 2 TABLETS BY MOUTH EVERY DAY 180 tablet 1   ASPIRIN 81 PO Take 81 mg by mouth daily.     B Complex Vitamins (VITAMIN B COMPLEX PO) Take 1 Dose by mouth daily.     chlorhexidine (PERIDEX) 0.12 % solution Use as directed 15 mLs in the mouth or throat 2 (two) times daily. (Patient not taking: Reported on 03/25/2022) 473 mL 1   co-enzyme Q-10 50 MG capsule Take 50 mg by mouth daily.     [employer] VITAMIN B12 1000 MCG tablet TAKE 1 TABLET BY MOUTH EVERY DAY 90 tablet 3   Denosumab (XGEVA [place]) Inject into the skin every 30 (thirty) days. Last rcvd 09/27/21     dexamethasone (DECADRON) 4 MG tablet Take 5 tablets (20 mg total) by mouth once a week. Take at least 1 hour prior to infusion appt. Take with food. To start on 06/07/21. (Patient not taking: Reported on 03/25/2022) 60 tablet 0   diphenhydrAMINE (BENADRYL) 50 MG tablet Take 1 tablet (50 mg total) by mouth See admin instructions. Take 1 tablet 1 hour prior to chemotherapy treatments. 30 tablet 0   hydrALAZINE (APRESOLINE) 10 MG tablet TAKE 1 TABLET(10 MG) BY MOUTH THREE TIMES DAILY 270 tablet 1   lisinopril (ZESTRIL) 20 MG  tablet TAKE 1 TABLET BY MOUTH EVERY DAY 90 tablet 2   magnesium chloride (SLOW-MAG) 64 MG TBEC SR tablet Take 1 tablet (64 mg total) by mouth daily. 60 tablet 0   metoprolol succinate (TOPROL-XL) 50 MG 24 hr tablet TAKE 4 TABLETS BY MOUTH EVERY DAY WITH OR IMMEDIATELY FOLLOWING A MEAL 360 tablet 2   montelukast (SINGULAIR) 10 MG tablet Take 1 tablet (10 mg total) by mouth See admin instructions. Take 1day prior to chemotherapy 30 tablet 1   ondansetron (ZOFRAN) 8 MG tablet Take 1 tablet (8 mg total) by mouth every 8 (eight) hours as needed. for nausea 30 tablet 1   pravastatin (PRAVACHOL) 10 MG tablet TAKE 1 TABLET BY MOUTH EVERY DAY 90 tablet 3   prochlorperazine (COMPAZINE) 10 MG tablet      sildenafil (REVATIO) 20 MG tablet Take 1-5 tablets (20-100 mg total) by mouth daily as needed.  50 tablet 12   zinc gluconate 50 MG tablet Take 50 mg by mouth daily.     No current facility-administered medications for this visit.   Facility-Administered Medications Ordered in Other Visits  Medication Dose Route Frequency Provider Last Rate Last Admin   0.9 %  sodium chloride infusion   Intravenous Once Earlie Server, MD        VITAL SIGNS: BP (!) 149/96   Pulse 82   Temp 98.8 F (37.1 C) (Tympanic)   Resp 16   Wt 207 lb (93.9 kg)   SpO2 97%   BMI 29.70 kg/m  Filed Weights   06/14/22 1141  Weight: 207 lb (93.9 kg)    Estimated body mass index is 29.7 kg/m as calculated from the following:   Height as of 04/22/22: 5' 10"  (1.778 m).   Weight as of this encounter: 207 lb (93.9 kg).  LABS: CBC:    Component Value Date/Time   WBC 7.2 05/20/2022 0844   HGB 14.2 05/20/2022 0844   HCT 41.4 05/20/2022 0844   PLT 104 (L) 05/20/2022 0844   MCV 100.5 (H) 05/20/2022 0844   NEUTROABS 4.2 05/20/2022 0844   LYMPHSABS 2.3 05/20/2022 0844   MONOABS 0.6 05/20/2022 0844   EOSABS 0.1 05/20/2022 0844   BASOSABS 0.0 05/20/2022 0844   Comprehensive Metabolic Panel:    Component Value Date/Time   NA 140  05/20/2022 0844   K 4.0 05/20/2022 0844   CL 108 05/20/2022 0844   CO2 24 05/20/2022 0844   BUN 23 05/20/2022 0844   CREATININE 1.50 (H) 05/20/2022 0844   GLUCOSE 112 (H) 05/20/2022 0844   CALCIUM 9.6 05/20/2022 0844   AST 22 05/20/2022 0844   ALT 24 05/20/2022 0844   ALKPHOS 62 05/20/2022 0844   BILITOT 0.5 05/20/2022 0844   PROT 6.8 05/20/2022 0844   ALBUMIN 4.5 05/20/2022 0844    RADIOGRAPHIC STUDIES: No results found.  PERFORMANCE STATUS (ECOG) : 0 - Asymptomatic  Review of Systems Unless otherwise noted, a complete review of systems is negative.  Physical Exam General: NAD HEENT: Noted to have fillings and bottom left molars, no abscess pocket or lesions or ulcers Cardiovascular: regular rate and rhythm Pulmonary: clear ant fields Abdomen: soft, nontender, + bowel sounds GU: no suprapubic tenderness Extremities: no edema, no joint deformities Skin: no rashes Neurological: Nonfocal  IMPRESSION/PLAN: Dental pain -patient is on Xgeva so we will send for CT of the jaw to rule out osteonecrosis.  However, highly suspicious his symptoms stem from his molar and not the jaw.  Could consider referral to oral surgeon if CT is negative. Refill oxycodone #20 to provide relief over the weekend  Case and plan discussed with Dr. Tasia Catchings  Addendum: CT negative.  Discussed with patient he will follow-up with his dentist and consider referral to oral. Patient to RTC next week as previously scheduled to see Dr. Tasia Catchings   Patient expressed understanding and was in agreement with this plan. He also understands that He can call clinic at any time with any questions, concerns, or complaints.   Thank you for allowing me to participate in the care of this very pleasant patient.   Time Total: 15 minutes  Visit consisted of counseling and education dealing with the complex and emotionally intense issues of symptom management in the setting of serious illness.Greater than 50%  of this time was spent  counseling and coordinating care related to the above assessment and plan.  Signed by: Altha Harm, PhD, NP-C

## 2022-06-15 ENCOUNTER — Other Ambulatory Visit: Payer: Self-pay | Admitting: Oncology

## 2022-06-15 DIAGNOSIS — C9000 Multiple myeloma not having achieved remission: Secondary | ICD-10-CM

## 2022-06-15 NOTE — Progress Notes (Signed)
ON PATHWAY REGIMEN - Multiple Myeloma and Other Plasma Cell Dyscrasias  No Change  Continue With Treatment as Ordered.  Original Decision Date/Time: 05/21/2021 21:10     Cycles 1 and 2: A cycle is every 28 days:     Pomalidomide      Dexamethasone      Daratumumab    Cycles 3 through 6: A cycle is every 28 days:     Pomalidomide      Dexamethasone      Dexamethasone      Daratumumab    Cycles 7 and beyond: A cycle is every 28 days:     Pomalidomide      Dexamethasone      Dexamethasone      Daratumumab   **Always confirm dose/schedule in your pharmacy ordering system**  Patient Characteristics: Multiple Myeloma, Relapsed / Refractory, Second through Fourth Lines of Therapy, Fit or Candidate for Triplet Therapy, Lenalidomide-Refractory or Lenalidomide-based Regimen Not Preferred, Candidate for Anti-CD38 Antibody Disease Classification: Multiple Myeloma R-ISS Staging: II Therapeutic Status: Relapsed Line of Therapy: Second Line Anti-CD38 Antibody Candidacy: Candidate for Anti-CD38 Antibody Lenalidomide-based Regimen Preference/Candidacy: Lenalidomide-based Regimen Not Preferred Intent of Therapy: Non-Curative / Palliative Intent, Discussed with Patient 

## 2022-06-18 ENCOUNTER — Encounter: Payer: Self-pay | Admitting: Oncology

## 2022-06-18 ENCOUNTER — Inpatient Hospital Stay: Payer: Medicare Other

## 2022-06-18 ENCOUNTER — Inpatient Hospital Stay (HOSPITAL_BASED_OUTPATIENT_CLINIC_OR_DEPARTMENT_OTHER): Payer: Medicare Other | Admitting: Oncology

## 2022-06-18 VITALS — BP 158/93 | HR 78 | Temp 96.8°F | Resp 18 | Wt 204.9 lb

## 2022-06-18 VITALS — BP 134/93 | HR 77

## 2022-06-18 DIAGNOSIS — C7951 Secondary malignant neoplasm of bone: Secondary | ICD-10-CM

## 2022-06-18 DIAGNOSIS — Z5111 Encounter for antineoplastic chemotherapy: Secondary | ICD-10-CM | POA: Diagnosis not present

## 2022-06-18 DIAGNOSIS — C9000 Multiple myeloma not having achieved remission: Secondary | ICD-10-CM

## 2022-06-18 DIAGNOSIS — R771 Abnormality of globulin: Secondary | ICD-10-CM

## 2022-06-18 DIAGNOSIS — D696 Thrombocytopenia, unspecified: Secondary | ICD-10-CM

## 2022-06-18 DIAGNOSIS — N1831 Chronic kidney disease, stage 3a: Secondary | ICD-10-CM

## 2022-06-18 DIAGNOSIS — Z5112 Encounter for antineoplastic immunotherapy: Secondary | ICD-10-CM | POA: Diagnosis not present

## 2022-06-18 LAB — CBC WITH DIFFERENTIAL/PLATELET
Abs Immature Granulocytes: 0.02 10*3/uL (ref 0.00–0.07)
Basophils Absolute: 0 10*3/uL (ref 0.0–0.1)
Basophils Relative: 0 %
Eosinophils Absolute: 0 10*3/uL (ref 0.0–0.5)
Eosinophils Relative: 1 %
HCT: 43.8 % (ref 39.0–52.0)
Hemoglobin: 15.2 g/dL (ref 13.0–17.0)
Immature Granulocytes: 0 %
Lymphocytes Relative: 21 %
Lymphs Abs: 1.4 10*3/uL (ref 0.7–4.0)
MCH: 34 pg (ref 26.0–34.0)
MCHC: 34.7 g/dL (ref 30.0–36.0)
MCV: 98 fL (ref 80.0–100.0)
Monocytes Absolute: 0.6 10*3/uL (ref 0.1–1.0)
Monocytes Relative: 9 %
Neutro Abs: 4.8 10*3/uL (ref 1.7–7.7)
Neutrophils Relative %: 69 %
Platelets: 106 10*3/uL — ABNORMAL LOW (ref 150–400)
RBC: 4.47 MIL/uL (ref 4.22–5.81)
RDW: 12.9 % (ref 11.5–15.5)
WBC: 6.8 10*3/uL (ref 4.0–10.5)
nRBC: 0 % (ref 0.0–0.2)

## 2022-06-18 LAB — COMPREHENSIVE METABOLIC PANEL
ALT: 26 U/L (ref 0–44)
AST: 26 U/L (ref 15–41)
Albumin: 4.7 g/dL (ref 3.5–5.0)
Alkaline Phosphatase: 57 U/L (ref 38–126)
Anion gap: 11 (ref 5–15)
BUN: 21 mg/dL (ref 8–23)
CO2: 23 mmol/L (ref 22–32)
Calcium: 9.8 mg/dL (ref 8.9–10.3)
Chloride: 102 mmol/L (ref 98–111)
Creatinine, Ser: 1.49 mg/dL — ABNORMAL HIGH (ref 0.61–1.24)
GFR, Estimated: 51 mL/min — ABNORMAL LOW (ref >60)
Glucose, Bld: 120 mg/dL — ABNORMAL HIGH (ref 70–99)
Potassium: 3.8 mmol/L (ref 3.5–5.1)
Sodium: 136 mmol/L (ref 135–145)
Total Bilirubin: 0.6 mg/dL (ref 0.3–1.2)
Total Protein: 7.9 g/dL (ref 6.5–8.1)

## 2022-06-18 MED ORDER — DIPHENHYDRAMINE HCL 25 MG PO CAPS
50.0000 mg | ORAL_CAPSULE | Freq: Once | ORAL | Status: AC
  Administered 2022-06-18: 50 mg via ORAL
  Filled 2022-06-18: qty 2

## 2022-06-18 MED ORDER — DEXAMETHASONE 4 MG PO TABS
20.0000 mg | ORAL_TABLET | Freq: Once | ORAL | Status: AC
  Administered 2022-06-18: 20 mg via ORAL
  Filled 2022-06-18: qty 5

## 2022-06-18 MED ORDER — DARATUMUMAB-HYALURONIDASE-FIHJ 1800-30000 MG-UT/15ML ~~LOC~~ SOLN
1800.0000 mg | Freq: Once | SUBCUTANEOUS | Status: AC
  Administered 2022-06-18: 1800 mg via SUBCUTANEOUS
  Filled 2022-06-18: qty 15

## 2022-06-18 MED ORDER — SODIUM CHLORIDE 0.9 % IV SOLN: 20.0000 mg | Freq: Once | INTRAVENOUS | Status: DC

## 2022-06-18 NOTE — Assessment & Plan Note (Signed)
Majority of his lucencies were not hypermetabolic on previous PET scan Continue calcium and vitamin D supplementation.  Hold Xgeva due to left jaw pain.  Pending dental evaluation. CT showed no signs of fracture or osteonecrosis.

## 2022-06-18 NOTE — Assessment & Plan Note (Signed)
Majority of his lucencies were not hypermetabolic on previous PET scan Xgeva monthly.resume Xgeva at next visit.  Continue calcium and vitamin D supplementation.

## 2022-06-18 NOTE — Assessment & Plan Note (Addendum)
Kappa Light chain multiple myeloma beta 2 microglobulin 5 and normal albumin.  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  06/04/2021 5% plasma cells -August 2022-February 2023, patient completed 7 cycles of daratumumab/Pomalyst/dexamethasone. 11/20/2021, status post bone marrow biopsy which showed 1% of plasma cells.  Likely he has minimal residual disease. Currently on daratumumab maintenance. Labs are reviewed and discussed with patient. light chain ratio stable Proceed with daratumumab today .   

## 2022-06-18 NOTE — Assessment & Plan Note (Signed)
Chemotherapy induced. Stable. Monitor counts.  

## 2022-06-18 NOTE — Progress Notes (Signed)
Hematology/Oncology Progress note [phone] [phone]      Patient Care Team: Tonia Ghent, MD as PCP - General (Family Medicine) Earlie Server, MD as Consulting Physician (Oncology) Beather Arbour Lilyan Punt, MD as Referring Physician (Hematology and Oncology) Diannia Ruder, MD as Referring Physician (Hematology and Oncology)  REASON FOR VISIT Follow up for multiple myeloma.   ASSESSMENT & PLAN:  Multiple myeloma (HCC) Kappa Light chain multiple myeloma beta 2 microglobulin 5 and normal albumin.  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  06/04/2021 5% plasma cells -August 2022-February 2023, patient completed 7 cycles of daratumumab/Pomalyst/dexamethasone. 11/20/2021, status post bone marrow biopsy which showed 1% of plasma cells.  Likely Luis Burke has minimal residual disease. Currently on daratumumab maintenance. Labs are reviewed and discussed with patient. Slight increase of light chain ratio due to decreased uninvolved lamda.  Proceed with daratumumab today  Thrombocytopenia ([place]) Chemotherapy induced. Stable. Monitor counts.   Encounter for antineoplastic chemotherapy Majority of his lucencies were not hypermetabolic on previous PET scan Xgeva monthly.resume Xgeva at next visit.  Continue calcium and vitamin D supplementation.   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  Encourage hydration.  No orders of the defined types were placed in this encounter.  Follow up in 4 weeks, Lab MD Dara  All questions were answered. The patient knows to call the clinic with any problems, questions or concerns.  Earlie Server, MD, PhD [facility] Hematology Oncology 06/18/2022   HISTORY OF PRESENTING ILLNESS:  Luis Burke is a  66 y.o.  male  with PMH listed below presents for follow up of multiple myeloma.  Oncology History  Multiple myeloma ([place])  08/19/2018 Bone Marrow Biopsy   08/19/2018 bone marrow biopsy showed hypercellular marrow 80%, involved by plasma cell neoplasm up to 95%.  Consistent with plasma cell myeloma. Myeloma FISH  gain of gain of CEP12   08/25/2018 Imaging   PET scan showed no definite hypermetabolic bone disease but CT findings are highly suspicious for numerous small myelomatous lesions involving the spine, sternum and scattered ribs.   08/26/2018 Initial Diagnosis   Multiple myeloma  # 07/27/2018 multiple myeloma panel showed M protein of 0.1, IgG 662, IgA 49, IgM 7. 08/10/2018, free light chain ratio showed extremely high level of kappa free light chain 10,183, with a kappa lambda light chain ratio of 1414.31,LDH 164, Beta-2 microglobulin 5     08/28/2018 - 03/17/2019 Chemotherapy   RVD x 9     04/23/2019 Bone Marrow Transplant   autologous stem cell bone marrow transplant at [facility].  Luis Burke received preparative regimen with melphalan 200 mg/m on 04/22/2019 followed by autologous stem cell infusion on 04/23/2019. Transplant course was complicated with febrile neutropenia grade 3, treated with vancomycin and cephapirin until engraftment on 05/06/2019. Patient also had engraftment syndrome and had to be started on Solu-Medrol 25 mg twice daily on 05/05/2019.  Transition to Medrol Dosepak on 05/07/2019 to complete steroid taper as outpatient.  re-vaccination 12 months post transplant   08/10/2019 - 05/2022 Chemotherapy    Ixazomib 50m on D1/D8/D15 out of 28 day cycle       07/12/2020 Imaging   CT skeletal survey done at [facility].  Lucent lesion in the L4 spinous process measuring 4 mm which is  nonspecific. Consider confirmation of marrow  replacing process with MRI.  2.  Incompletely characterized renal cysts    07/28/2020 Bone Marrow Biopsy    bone marrow normocellular marrow with polytypic plasmacytosis, 3 to  8%   Patient is in remission.  Cytogenetics negative for trisomy 12.  Negative myeloma FISH panel.   08/05/2020 Imaging   MRI lumbar spine with and without contrast was done at [facility] showed no evidence of spinal metastasis.  Lumbar spondylosis is most pronounced at L5-S1 where there is severe right and moderate left neural foraminal stenosis. 08/11/2020, x-ray cervical spine complete with flexion and extension showed The cervical spine is visualized from C1 to the top of T1 on the lateral  view.  Reversal of the normal cervical lordosis with a focal kyphosis centered at the C5-C6 intervertebral level. Trace anterolisthesis of C4 onto C5. Trace retrolisthesis of C6 on C7. No evidence of dynamic instability is noted on  the flexion or extension views.   No prevertebral soft tissue swelling. Cervical vertebral body heights are maintained. Multilevel degenerative changes of the cervical spine including discogenic disease, most pronounced at C5-C6 and C6-C7, and facet arthropathy most pronounced at C4-C5. Multilevel mild to moderate left neural foraminal osseous narrowing of the mid to lower cervical vertebrae, possibly centrally/artifactual by patient positioning.     05/15/2021 Bone Marrow Biopsy   Bone marrow biopsy showed slightly hypercellular bone marrow for age with slight plasmacytosis.  5% plasma cells with kappa light chain restriction.  Cytogenetics normal.  Myeloma FISH negative I discussed with Luis Burke. we both feel that his bone marrow biopsy result reflects early relapse. Recommend stop Ixazomib. Switch to second line treatment with Daratumumab Pomlyst Dexamethasone   05/31/2021 - 05/20/2022 Chemotherapy   Patient is on Treatment Plan : MYELOMA Daratumumab + Pomalidomide + Dexamethasone q28d x 7 cycles     05/31/2021 -  Chemotherapy   Patient is on Treatment Plan : MYELOMA Daratumumab IV + Pomalidomide + Dexamethasone q28d x 7 cycles     09/21/2021 Imaging    PET scan showed  no evidence of suspicious or new hypermetabolism, scattered small lucent foci seen previously in the sternum and the spine are less prominent today and have resolved in some regions.  Aortic atherosclerosis.   11/18/2021 Imaging   MRI cervical spine with and without contrast showed no multiple myeloma lesion or marrow edema identified in the cervical spine.  Abnormal dural thickening and a/or abnormal epidural space at C4/C5 levels.  This is nonspecific.  ER physician discussed the case with neurosurgery Dr. Cari Caraway who feels that this is more likely degenerative disc disease.   11/20/2021 Bone Marrow Biopsy   bone marrow biopsy which showed 1% of plasma cells. The plasma cells generally display polyclonal staining pattern for kappa and lambda light chains although a few very small clusters appear kappa light chain restricted.  The findings are very limited but most suggestive of minimal residual plasma cell neoplasm especially in the presence of monoclonal protein with kappa light chain specificity. Normal cytogenetics and myeloma FISH   12/20/2021 -  Chemotherapy   Daratumumab monthly maintenance  # I discussed with patient's [facility] oncologist Dr. Maylene Roes.  We have agreed upon de-escalating his regimen to Daratumumab monthly maintenance for now. Discontinue pomalyst.     Metastasis to bone ([place])   #01/14/2022, received IVIG 400 mg/kg x1   INTERVAL HISTORY ZYKEEM BAUSERMAN is a 66 y.o. male who has above history reviewed by me today for follow-up multiple myeloma.  Denies any nausea  vomiting diarrhea today Patient has experienced left tooth/jaw pain for 2 weeks. CT maxillofacial showed no fracture or osteonecrosis.   Review of Systems  Constitutional:  Negative for appetite change, chills, fatigue, fever and unexpected weight change.  HENT:   Negative for hearing loss.   Eyes:  Negative for eye problems and icterus.  Respiratory:  Negative for chest tightness, cough and shortness of breath.    Cardiovascular:  Negative for chest pain and leg swelling.  Gastrointestinal:  Negative for abdominal distention and abdominal pain.  Endocrine: Negative for hot flashes.  Genitourinary:  Negative for difficulty urinating, dysuria and frequency.   Musculoskeletal:  Positive for back pain and neck pain. Negative for arthralgias.  Skin:  Negative for itching and rash.  Neurological:  Negative for light-headedness and numbness.  Hematological:  Negative for adenopathy. Does not bruise/bleed easily.  Psychiatric/Behavioral:  Negative for confusion.     MEDICAL HISTORY:  Past Medical History:  Diagnosis Date   AKI (acute kidney injury) ([place]) 01/06/2019   Anemia    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[place])    autologous stem cell bone marrow transplant on 04/23/2019.   Fatty liver    on u/s 07/2018   Hyperlipidemia 03/2004   Hypertension 1994   Multiple myeloma ([place])    Snores     SURGICAL HISTORY: Past Surgical History:  Procedure Laterality Date   CATARACT EXTRACTION Left 10/10/2021   CATARACT EXTRACTION W/PHACO Right 03/04/2018   Procedure: CATARACT EXTRACTION PHACO AND INTRAOCULAR LENS PLACEMENT ([place])  RIGHT TORIC;  Surgeon: Leandrew Koyanagi, MD;  Location: [place];  Service: Ophthalmology;  Laterality: Right;  [place] no Toric Lens 1:45 5.3   CATARACT EXTRACTION W/PHACO Left 10/10/2021   Procedure: CATARACT EXTRACTION PHACO AND INTRAOCULAR LENS PLACEMENT ([place]) LEFT;  Surgeon: Leandrew Koyanagi, MD;  Location: [address];  Service: Ophthalmology;  Laterality: Left;  5.16 1:03.8   COLONOSCOPY WITH PROPOFOL N/A 07/20/2018   Procedure: COLONOSCOPY WITH PROPOFOL;  Surgeon: Jonathon Bellows, MD;  Location: [facility] ENDOSCOPY;  Service: Gastroenterology;  Laterality: N/A;   ESOPHAGOGASTRODUODENOSCOPY (EGD) WITH PROPOFOL N/A 07/20/2018   Procedure: ESOPHAGOGASTRODUODENOSCOPY (EGD) WITH PROPOFOL;  Surgeon: Jonathon Bellows, MD;  Location: [facility] ENDOSCOPY;  Service:  Gastroenterology;  Laterality: N/A;   EYE SURGERY Right    HERNIA REPAIR     L Lap herniorraphy  04/2000   Left wrist ganglionectomy      SOCIAL HISTORY: Social History   Socioeconomic History   Marital status: Married    Spouse name: Not on file   Number of children: 1   Years of education: Not on file   Highest education level: Not on file  Occupational History   Occupation: [employer]  Tobacco Use   Smoking status: Never   Smokeless tobacco: Never   Tobacco comments:    Occassionally  Vaping Use   Vaping Use: Never used  Substance and Sexual Activity   Alcohol use: Not Currently    Alcohol/week: 3.0 standard drinks of alcohol    Types: 3 Cans of beer per week   Drug use: No   Sexual activity: Not on file  Other Topics Concern   Not on file  Social History Narrative   Divorced 2009, married 06-03-21.     His daughter died 4 days after giving birth to the patient's granddaughter   Has joint custody of his deceased daughter's child   Worked at [employer], retired 2020.     Social Determinants of Health  Financial Resource Strain: Not on file  Food Insecurity: Not on file  Transportation Needs: Not on file  Physical Activity: Not on file  Stress: Not on file  Social Connections: Not on file  Intimate Partner Violence: Not on file    FAMILY HISTORY: Family History  Problem Relation Age of Onset   Hypertension Mother    Diabetes Mother    Heart disease Mother        CAD   Dementia Mother    Prostate cancer Father    Hypertension Father    Heart disease Father        MI 02/03   Colon cancer Father    Hypertension Sister    Hypertension Sister    Hypertension Sister     ALLERGIES:  is allergic to bactrim [sulfamethoxazole-trimethoprim], clonidine derivatives, and tadalafil.  MEDICATIONS:  Current Outpatient Medications  Medication Sig Dispense Refill   acetaminophen (TYLENOL) 325 MG tablet Take 2 tablets (650 mg total) by mouth  See admin instructions. 1 hour prior to chemotherapy treatments 30 tablet 0   acetaminophen-codeine (TYLENOL #3) 300-30 MG tablet Take 1 tablet by mouth every 8 (eight) hours as needed for moderate pain. 10 tablet 0   acyclovir (ZOVIRAX) 400 MG tablet Take 1 tablet (400 mg total) by mouth 2 (two) times daily. 60 tablet 2   amLODipine (NORVASC) 5 MG tablet TAKE 2 TABLETS BY MOUTH EVERY DAY 180 tablet 1   ASPIRIN 81 PO Take 81 mg by mouth daily.     B Complex Vitamins (VITAMIN B COMPLEX PO) Take 1 Dose by mouth daily.     chlorhexidine (PERIDEX) 0.12 % solution Use as directed 15 mLs in the mouth or throat 2 (two) times daily. (Patient not taking: Reported on 03/25/2022) 473 mL 1   co-enzyme Q-10 50 MG capsule Take 50 mg by mouth daily.     [employer] VITAMIN B12 1000 MCG tablet TAKE 1 TABLET BY MOUTH EVERY DAY 90 tablet 3   Denosumab (XGEVA [place]) Inject into the skin every 30 (thirty) days. Last rcvd 09/27/21     dexamethasone (DECADRON) 4 MG tablet Take 5 tablets (20 mg total) by mouth once a week. Take at least 1 hour prior to infusion appt. Take with food. To start on 06/07/21. (Patient not taking: Reported on 03/25/2022) 60 tablet 0   diphenhydrAMINE (BENADRYL) 50 MG tablet Take 1 tablet (50 mg total) by mouth See admin instructions. Take 1 tablet 1 hour prior to chemotherapy treatments. 30 tablet 0   hydrALAZINE (APRESOLINE) 10 MG tablet TAKE 1 TABLET(10 MG) BY MOUTH THREE TIMES DAILY 270 tablet 1   lisinopril (ZESTRIL) 20 MG tablet TAKE 1 TABLET BY MOUTH EVERY DAY 90 tablet 2   magnesium chloride (SLOW-MAG) 64 MG TBEC SR tablet Take 1 tablet (64 mg total) by mouth daily. 60 tablet 0   metoprolol succinate (TOPROL-XL) 50 MG 24 hr tablet TAKE 4 TABLETS BY MOUTH EVERY DAY WITH OR IMMEDIATELY FOLLOWING A MEAL 360 tablet 2   montelukast (SINGULAIR) 10 MG tablet Take 1 tablet (10 mg total) by mouth See admin instructions. Take 1day prior to chemotherapy 30 tablet 1   ondansetron (ZOFRAN) 8 MG tablet Take 1  tablet (8 mg total) by mouth every 8 (eight) hours as needed. for nausea 30 tablet 1   oxyCODONE (OXY IR/ROXICODONE) 5 MG immediate release tablet Take 1 tablet (5 mg total) by mouth every 4 (four) hours as needed for severe pain. 20 tablet 0   pravastatin (  PRAVACHOL) 10 MG tablet TAKE 1 TABLET BY MOUTH EVERY DAY 90 tablet 3   prochlorperazine (COMPAZINE) 10 MG tablet      sildenafil (REVATIO) 20 MG tablet Take 1-5 tablets (20-100 mg total) by mouth daily as needed. 50 tablet 12   zinc gluconate 50 MG tablet Take 50 mg by mouth daily.     No current facility-administered medications for this visit.   Facility-Administered Medications Ordered in Other Visits  Medication Dose Route Frequency Provider Last Rate Last Admin   0.9 %  sodium chloride infusion   Intravenous Once Earlie Server, MD         PHYSICAL EXAMINATION: ECOG PERFORMANCE STATUS: 1 - Symptomatic but completely ambulatory Vitals:   06/18/22 0903  BP: (!) 158/93  Pulse: 78  Resp: 18  Temp: (!) 96.8 F (36 C)   Filed Weights   06/18/22 0903  Weight: 204 lb 14.4 oz (92.9 kg)    Physical Exam Constitutional:      General: Luis Burke is not in acute distress. HENT:     Head: Normocephalic and atraumatic.  Eyes:     General: No scleral icterus. Cardiovascular:     Rate and Rhythm: Normal rate and regular rhythm.     Heart sounds: Normal heart sounds.  Pulmonary:     Effort: Pulmonary effort is normal. No respiratory distress.     Breath sounds: No wheezing.  Abdominal:     General: Bowel sounds are normal. There is no distension.     Palpations: Abdomen is soft.  Musculoskeletal:        General: No deformity. Normal range of motion.     Cervical back: Normal range of motion and neck supple.  Skin:    General: Skin is warm and dry.     Findings: No erythema or rash.  Neurological:     Mental Status: Luis Burke is alert and oriented to person, place, and time. Mental status is at baseline.     Cranial Nerves: No cranial nerve  deficit.     Coordination: Coordination normal.  Psychiatric:        Mood and Affect: Mood normal.      LABORATORY DATA:  I have reviewed the data as listed Lab Results  Component Value Date   WBC 6.8 06/18/2022   HGB 15.2 06/18/2022   HCT 43.8 06/18/2022   MCV 98.0 06/18/2022   PLT 106 (L) 06/18/2022   Recent Labs    04/22/22 0843 05/20/22 0844 06/18/22 0846  NA 138 140 136  K 3.9 4.0 3.8  CL 107 108 102  CO2 24 24 23   GLUCOSE 104* 112* 120*  BUN 18 23 21   CREATININE 1.35* 1.50* 1.49*  CALCIUM 9.2 9.6 9.8  GFRNONAA 58* 51* 51*  PROT 6.8 6.8 7.9  ALBUMIN 4.3 4.5 4.7  AST 20 22 26   ALT 19 24 26   ALKPHOS 58 62 57  BILITOT 0.7 0.5 0.6    Iron/TIBC/Ferritin/ %Sat    Component Value Date/Time   IRON 74 07/01/2018 0944   TIBC 250 07/01/2018 0944   FERRITIN 357 07/01/2018 0944   IRONPCTSAT 30 07/01/2018 0944    Lab Results  Component Value Date   TOTALPROTELP 6.4 05/20/2022   ALBUMINELP 4.0 03/08/2021   A1GS 0.2 03/08/2021   A2GS 0.7 03/08/2021   BETS 1.1 03/08/2021   GAMS 1.1 03/08/2021   MSPIKE Not Observed 03/08/2021   SPEI Comment 03/08/2021   Lab Results  Component Value Date   KPAFRELGTCHN 10.5 05/20/2022  LAMBDASER 7.4 05/20/2022   KAPLAMBRATIO 1.42 05/20/2022    RADIOGRAPHIC STUDIES: I have personally reviewed the radiological images as listed and agreed with the findings in the report. CT MAXILLOFACIAL WO CONTRAST  Result Date: 06/14/2022 CLINICAL DATA:  Left jaw pain. EXAM: CT MAXILLOFACIAL WITHOUT CONTRAST TECHNIQUE: Multidetector CT imaging of the maxillofacial structures was performed. Multiplanar CT image reconstructions were also generated. RADIATION DOSE REDUCTION: This exam was performed according to the departmental dose-optimization program which includes automated exposure control, adjustment of the mA and/or kV according to patient size and/or use of iterative reconstruction technique. COMPARISON:  None Available. FINDINGS: Osseous:  No fracture or mandibular dislocation. No destructive process. Orbits: Negative. No traumatic or inflammatory finding. Sinuses: Clear. Soft tissues: Negative. Limited intracranial: No significant or unexpected finding. IMPRESSION: No definite abnormality seen in the maxillofacial region. Electronically Signed   By: Marijo Conception M.D.   On: 06/14/2022 14:01

## 2022-06-18 NOTE — Assessment & Plan Note (Signed)
avoid nephrotoxin.  Encourage hydration. 

## 2022-06-18 NOTE — Patient Instructions (Signed)
[facility]  Discharge Instructions: Thank you for choosing [facility] to provide your oncology and hematology care.  If you have a lab appointment with the Cancer Center, please go directly to the Cancer Center and check in at the registration area.  Wear comfortable clothing and clothing appropriate for easy access to any Portacath or PICC line.   We strive to give you quality time with your provider. You may need to reschedule your appointment if you arrive late (15 or more minutes).  Arriving late affects you and other patients whose appointments are after yours.  Also, if you miss three or more appointments without notifying the office, you may be dismissed from the clinic at the provider's discretion.      For prescription refill requests, have your pharmacy contact our office and allow 72 hours for refills to be completed.    Today you received the following chemotherapy and/or immunotherapy agents: Darzalex Faspro      To help prevent nausea and vomiting after your treatment, we encourage you to take your nausea medication as directed.  BELOW ARE SYMPTOMS THAT SHOULD BE REPORTED IMMEDIATELY: *FEVER GREATER THAN 100.4 F (38 C) OR HIGHER *CHILLS OR SWEATING *NAUSEA AND VOMITING THAT IS NOT CONTROLLED WITH YOUR NAUSEA MEDICATION *UNUSUAL SHORTNESS OF BREATH *UNUSUAL BRUISING OR BLEEDING *URINARY PROBLEMS (pain or burning when urinating, or frequent urination) *BOWEL PROBLEMS (unusual diarrhea, constipation, pain near the anus) TENDERNESS IN MOUTH AND THROAT WITH OR WITHOUT PRESENCE OF ULCERS (sore throat, sores in mouth, or a toothache) UNUSUAL RASH, SWELLING OR PAIN  UNUSUAL VAGINAL DISCHARGE OR ITCHING   Items with * indicate a potential emergency and should be followed up as soon as possible or go to the Emergency Department if any problems should occur.  Please show the CHEMOTHERAPY ALERT CARD or IMMUNOTHERAPY ALERT CARD at  check-in to the Emergency Department and triage nurse.  Should you have questions after your visit or need to cancel or reschedule your appointment, please contact [facility]  [phone] and follow the prompts.  Office hours are 8:00 a.m. to 4:30 p.m. Monday - Friday. Please note that voicemails left after 4:00 p.m. may not be returned until the following business day.  We are closed weekends and major holidays. You have access to a nurse at all times for urgent questions. Please call the main number to the clinic [phone] and follow the prompts.  For any non-urgent questions, you may also contact your provider using MyChart. We now offer e-Visits for anyone 18 and older to request care online for non-urgent symptoms. For details visit [email or web address].   Also download the MyChart app! Go to the app store, search "MyChart", open the app, select [facility], and log in with your MyChart username and password.  Masks are optional in the cancer centers. If you would like for your care team to wear a mask while they are taking care of you, please let them know. For doctor visits, patients may have with them one support person who is at least 66 years old. At this time, visitors are not allowed in the infusion area.   

## 2022-06-18 NOTE — Assessment & Plan Note (Signed)
IVIG if IgG <400   

## 2022-06-19 LAB — KAPPA/LAMBDA LIGHT CHAINS
Kappa free light chain: 11.3 mg/L (ref 3.3–19.4)
Kappa, lambda light chain ratio: 1.66 — ABNORMAL HIGH (ref 0.26–1.65)
Lambda free light chains: 6.8 mg/L (ref 5.7–26.3)

## 2022-06-26 ENCOUNTER — Telehealth: Payer: Self-pay | Admitting: *Deleted

## 2022-06-26 NOTE — Telephone Encounter (Signed)
Dr. Hampton Abbot left message requesting call back from Dr. Tasia Catchings or Billey Chang, NP regarding procedure patient is scheduled for with Dr. Hampton Abbot tomorrow. CB # [id number].

## 2022-07-03 ENCOUNTER — Telehealth: Payer: Self-pay | Admitting: Family Medicine

## 2022-07-03 NOTE — Telephone Encounter (Signed)
Patient due for [facility] wellness visit after 07/23/22; please call to schedule.

## 2022-07-03 NOTE — Telephone Encounter (Signed)
Called pt scheduled for 07/25/22

## 2022-07-10 ENCOUNTER — Telehealth: Payer: Self-pay

## 2022-07-10 ENCOUNTER — Ambulatory Visit (INDEPENDENT_AMBULATORY_CARE_PROVIDER_SITE_OTHER): Payer: Medicare Other | Admitting: Family Medicine

## 2022-07-10 ENCOUNTER — Encounter: Payer: Self-pay | Admitting: Family Medicine

## 2022-07-10 VITALS — BP 138/78 | HR 72 | Temp 99.5°F | Ht 70.0 in | Wt 202.0 lb

## 2022-07-10 DIAGNOSIS — J208 Acute bronchitis due to other specified organisms: Secondary | ICD-10-CM | POA: Diagnosis not present

## 2022-07-10 DIAGNOSIS — C9000 Multiple myeloma not having achieved remission: Secondary | ICD-10-CM

## 2022-07-10 MED ORDER — GUAIFENESIN-CODEINE 100-10 MG/5ML PO SYRP: 5.0000 mL | ORAL_SOLUTION | Freq: Three times a day (TID) | ORAL | 0 refills | Status: DC | PRN

## 2022-07-10 MED ORDER — BENZONATATE 200 MG PO CAPS: 200.0000 mg | ORAL_CAPSULE | Freq: Three times a day (TID) | ORAL | 1 refills | Status: DC | PRN

## 2022-07-10 MED ORDER — DOXYCYCLINE HYCLATE 100 MG PO TABS: 100.0000 mg | ORAL_TABLET | Freq: Two times a day (BID) | ORAL | 0 refills | Status: DC

## 2022-07-10 NOTE — Telephone Encounter (Signed)
Per appt notes pt already scheduled 07/10/22 at 11:20 with Dr Lorelei Pont; per access note pt had negative home covid test on 07/09/22.

## 2022-07-10 NOTE — Progress Notes (Signed)
  Spencer T. Copland, MD, CAQ Sports Medicine [facility] [address] [place], [address]  Phone: [phone]  FAX: [phone]  Keinan L Bottcher - 66 y.o. male  MRN [id number]  Date of Birth: 01/18/1956  Date: 07/10/2022  PCP: Duncan, Graham S, MD  Referral: Duncan, Graham S, MD  Chief Complaint  Patient presents with   Cough    With chest congestion-Negative  Home Covid Test yesterday-Started Last Friday Chemo scheduled for Tuesday   Sore Throat   Subjective:   Luis Burke is a 66 y.o. very pleasant male patient with Body mass index is 28.98 kg/m. who presents with the following:  On chemo, multiple myeloma.   Ongoing chemo since 2019.  Most recent neutrophil count is normal.  Covid neg yesterday.   Patient presents with some acute coughing over the last 5 or 6 days.  He also has had a sore throat.  He has also had some nasal congestion and generally does not feel well.  He does not have a fever and he is eating and drinking okay.  He denies any GI symptoms including no nausea, vomiting, diarrhea.  Review of Systems is noted in the HPI, as appropriate  Objective:   BP 138/78   Pulse 72   Temp 99.5 F (37.5 C) (Oral)   Ht 5' 10" (1.778 m)   Wt 202 lb (91.6 kg)   SpO2 94%   BMI 28.98 kg/m    Gen: WDWN, NAD. Globally Non-toxic HEENT: Throat clear, w/o exudate, R TM clear, L TM - good landmarks, No fluid present. rhinnorhea.  MMM Frontal sinuses: NT Max sinuses: NT NECK: Anterior cervical  LAD is absent CV: RRR, No M/G/R, cap refill <2 sec PULM: Breathing comfortably in no respiratory distress. no wheezing, crackles, rhonchi   Laboratory and Imaging Data:  Assessment and Plan:     ICD-10-CM   1. Acute bronchitis due to other specified organisms  J20.8     2. Multiple myeloma not having achieved remission (HCC)  C90.00      Acute respiratory lower respiratory in a patient with active chemotherapy.  High  risk patient, I am going to place the patient on some doxycycline to give him some cough medications.  Medication Management during today's office visit: Meds ordered this encounter  Medications   doxycycline (VIBRA-TABS) 100 MG tablet    Sig: Take 1 tablet (100 mg total) by mouth 2 (two) times daily.    Dispense:  20 tablet    Refill:  0   benzonatate (TESSALON) 200 MG capsule    Sig: Take 1 capsule (200 mg total) by mouth 3 (three) times daily as needed for cough.    Dispense:  40 capsule    Refill:  1   guaiFENesin-codeine (ROBITUSSIN AC) 100-10 MG/5ML syrup    Sig: Take 5 mLs by mouth 3 (three) times daily as needed for cough.    Dispense:  120 mL    Refill:  0   There are no discontinued medications.  Orders placed today for conditions managed today: No orders of the defined types were placed in this encounter.   Disposition: No follow-ups on file.  Dragon [employer] One speech-to-text software was used for transcription in this dictation.  Possible transcriptional errors can occur using Dragon software.   Signed,  Spencer T. Copland, MD   Outpatient Encounter Medications as of 07/10/2022  Medication Sig   acetaminophen (TYLENOL) 325 MG tablet Take 2   tablets (650 mg total) by mouth See admin instructions. 1 hour prior to chemotherapy treatments   acetaminophen-codeine (TYLENOL #3) 300-30 MG tablet Take 1 tablet by mouth every 8 (eight) hours as needed for moderate pain.   acyclovir (ZOVIRAX) 400 MG tablet Take 1 tablet (400 mg total) by mouth 2 (two) times daily.   amLODipine (NORVASC) 5 MG tablet TAKE 2 TABLETS BY MOUTH EVERY DAY   ASPIRIN 81 PO Take 81 mg by mouth daily.   B Complex Vitamins (VITAMIN B COMPLEX PO) Take 1 Dose by mouth daily.   benzonatate (TESSALON) 200 MG capsule Take 1 capsule (200 mg total) by mouth 3 (three) times daily as needed for cough.   chlorhexidine (PERIDEX) 0.12 % solution Use as directed 15 mLs in the mouth or throat 2 (two) times daily.    co-enzyme Q-10 50 MG capsule Take 50 mg by mouth daily.   [facility] VITAMIN B12 1000 MCG tablet TAKE 1 TABLET BY MOUTH EVERY DAY   Denosumab (XGEVA [place]) Inject into the skin every 30 (thirty) days. Last rcvd 09/27/21   dexamethasone (DECADRON) 4 MG tablet Take 5 tablets (20 mg total) by mouth once a week. Take at least 1 hour prior to infusion appt. Take with food. To start on 06/07/21.   diphenhydrAMINE (BENADRYL) 50 MG tablet Take 1 tablet (50 mg total) by mouth See admin instructions. Take 1 tablet 1 hour prior to chemotherapy treatments.   doxycycline (VIBRA-TABS) 100 MG tablet Take 1 tablet (100 mg total) by mouth 2 (two) times daily.   guaiFENesin-codeine (ROBITUSSIN AC) 100-10 MG/5ML syrup Take 5 mLs by mouth 3 (three) times daily as needed for cough.   hydrALAZINE (APRESOLINE) 10 MG tablet TAKE 1 TABLET(10 MG) BY MOUTH THREE TIMES DAILY   lisinopril (ZESTRIL) 20 MG tablet TAKE 1 TABLET BY MOUTH EVERY DAY   magnesium chloride (SLOW-MAG) 64 MG TBEC SR tablet Take 1 tablet (64 mg total) by mouth daily.   metoprolol succinate (TOPROL-XL) 50 MG 24 hr tablet TAKE 4 TABLETS BY MOUTH EVERY DAY WITH OR IMMEDIATELY FOLLOWING A MEAL   montelukast (SINGULAIR) 10 MG tablet Take 1 tablet (10 mg total) by mouth See admin instructions. Take 1day prior to chemotherapy   ondansetron (ZOFRAN) 8 MG tablet Take 1 tablet (8 mg total) by mouth every 8 (eight) hours as needed. for nausea   oxyCODONE (OXY IR/ROXICODONE) 5 MG immediate release tablet Take 1 tablet (5 mg total) by mouth every 4 (four) hours as needed for severe pain.   pravastatin (PRAVACHOL) 10 MG tablet TAKE 1 TABLET BY MOUTH EVERY DAY   prochlorperazine (COMPAZINE) 10 MG tablet    sildenafil (REVATIO) 20 MG tablet Take 1-5 tablets (20-100 mg total) by mouth daily as needed.   zinc gluconate 50 MG tablet Take 50 mg by mouth daily.   Facility-Administered Encounter Medications as of 07/10/2022  Medication   0.9 %  sodium chloride infusion    

## 2022-07-10 NOTE — Telephone Encounter (Signed)
[facility] - Client TELEPHONE ADVICE RECORD [facility] Patient Name: Luis Burke Gender: Male DOB: 07/09/1956 Age: 66 Y 54 M 19 D Return Phone Number: [phone] (Primary) Address: City/ State/ Zip: [place] [address] Client [facility] - Client Client Site [facility] - Day Provider Renford Dills - MD Contact Type Call Who Is Calling Patient / Member / Family / Caregiver Call Type Triage / Clinical Relationship To Patient Self Return Phone Number [phone] (Primary) Chief Complaint Sore Throat Reason for Call Symptomatic / Request for Sikes states he has an appointment in two weeks, but he has been coughing, has congestion and a sore throat for the past 5 days . He would like to get a sooner appointment. Caller states he will get chemo next week. Caller states he would like to get an appointment for today if possible, he lives 5 minutes away from the office. Translation No Nurse Assessment Nurse: Cherre Robins, RN, Ria Comment Date/Time (Eastern Time): 07/10/2022 9:13:34 AM Confirm and document reason for call. If symptomatic, describe symptoms. ---Caller states he has had congestion and a sore throat for the last five days. Caller states he has chemo monthly- next visit scheduled for next week. Dx'd with multiple myeloma bone cancer. No fever. Covid home test yesterday was negative. Does the patient have any new or worsening symptoms? ---Yes Will a triage be completed? ---Yes Related visit to physician within the last 2 weeks? ---Yes Does the PT have any chronic conditions? (i.e. diabetes, asthma, this includes High risk factors for pregnancy, etc.) ---Yes List chronic conditions. ---HTN, cancer pt, Is this a behavioral health or substance abuse call? ---No Guidelines Guideline Title Affirmed Question Affirmed Notes Nurse Date/Time  (Eastern Time) Cough - Acute Productive SEVERE coughing spells (e.g., whooping sound after coughing, Weiss-Hilton, RN, Ria Comment 07/10/2022 9:18:30 AM PLEASE NOTE: All timestamps contained within this report are represented as [place] Standard Time. CONFIDENTIALTY NOTICE: This fax transmission is intended only for the addressee. It contains information that is legally privileged, confidential or otherwise protected from use or disclosure. If you are not the intended recipient, you are strictly prohibited from reviewing, disclosing, copying using or disseminating any of this information or taking any action in reliance on or regarding this information. If you have received this fax in error, please notify us immediately by telephone so that we can arrange for its return to [place]. Phone: [phone], Toll-Free: [phone], Fax: [phone] Page: 2 of 3 Call Id: [id number] Guidelines Guideline Title Affirmed Question Affirmed Notes Nurse Date/Time Eilene Ghazi Time) vomiting after coughing) Sore Throat Diabetes mellitus or weak immune system (e.g., HIV positive, cancer chemo, splenectomy, organ transplant, chronic steroids) Weiss-Hilton, RN, Ria Comment 07/10/2022 9:22:46 AM Disp. Time Eilene Ghazi Time) Disposition Final User 07/10/2022 9:22:34 AM See PCP within 24 Hours Weiss-Hilton, RN, Ria Comment 07/10/2022 9:25:05 AM See PCP within 24 Hours Yes Weiss-Hilton, RN, Ria Comment Final Disposition 07/10/2022 9:25:05 AM See PCP within 24 Hours Yes Weiss-Hilton, RN, Ivonne Andrew Disagree/Comply Comply Caller Understands Yes PreDisposition Call Doctor Care Advice Given Per Guideline SEE PCP WITHIN 24 HOURS: * IF OFFICE WILL BE OPEN: You need to be examined within the next 24 hours. Call your doctor (or NP/PA) when the office opens and make an appointment. COUGHING SPELLS: * Drink warm fluids. Inhale warm mist. This can help relax the airway and also loosen up phlegm. * Suck on cough drops or hard candy  to coat the irritated throat. COUGH  MEDICINES: * COUGH SYRUP WITH DEXTROMETHORPHAN: An over-the-counter cough syrup can help your cough. The most common cough suppressant in over-the-counter cough medicines is dextromethorphan. * Cough syrup works best for coughs that keep you awake at night. It can also sometimes help in the late stages of a lung or airway infection when the cough is dry and hacking. Cough syrup can be used along with cough drops. COUGH SYRUP WITH DEXTROMETHORPHAN - EXTRA NOTES AND WARNINGS: * Do not try to completely stop coughs that produce mucus and phlegm. * Coughing is helpful. It brings up the mucus from the lungs and helps prevent pneumonia. * HOME REMEDY - HONEY: This old home remedy has been shown to help decrease coughing at night. The adult dosage is 2 teaspoons (10 ml) at bedtime. * Examples: Delsym 12-hour Cough, Robitussin Cough Long-Acting, Triaminic Long-Acting, Vicks DayQuil Cough. * Before taking any medicine, read all the instructions on the package. HUMIDIFIER: * If the air is dry, use a humidifier in the bedroom. * Dry air makes coughs worse. CALL BACK IF: * Difficulty breathing occurs * You become worse CARE ADVICE given per Cough - Acute Productive (Adult) guideline. * IF OFFICE WILL BE OPEN: You need to be examined within the next 24 hours. Call your doctor (or NP/PA) when the office opens and make an appointment. SEE PCP WITHIN 24 HOURS: * Gargle with warm salt water four times a day. To make salt water, put 1/2 teaspoon of salt in 8 oz (240 ml) of warm water. * Sip warm chicken broth or apple juice. CALL BACK IF: * You become worse CARE ADVICE given per Sore Throat (Adult) guideline. PLEASE NOTE: All timestamps contained within this report are represented as [place] Standard Time. CONFIDENTIALTY NOTICE: This fax transmission is intended only for the addressee. It contains information that is legally privileged, confidential or otherwise protected from use  or disclosure. If you are not the intended recipient, you are strictly prohibited from reviewing, disclosing, copying using or disseminating any of this information or taking any action in reliance on or regarding this information. If you have received this fax in error, please notify us immediately by telephone so that we can arrange for its return to [place]. Phone: [phone], Toll-Free: [phone], Fax: [phone] Page: 3 of 3 Call Id: [id number] Comments User: Carolan Clines, RN Date/Time Eilene Ghazi Time): 07/10/2022 9:18:19 AM also c/o cough User: Carolan Clines, RN Date/Time Eilene Ghazi Time): 07/10/2022 9:19:21 AM States cough is bothering more than sore throat. User: Carolan Clines, RN Date/Time Eilene Ghazi Time): 07/10/2022 9:25:46 AM Caller stated he already has an appt for 11:20am today. Referrals REFERRED TO PCP OFFIC

## 2022-07-12 ENCOUNTER — Inpatient Hospital Stay (HOSPITAL_BASED_OUTPATIENT_CLINIC_OR_DEPARTMENT_OTHER): Payer: Medicare Other | Admitting: Hospice and Palliative Medicine

## 2022-07-12 ENCOUNTER — Ambulatory Visit
Admission: RE | Admit: 2022-07-12 | Discharge: 2022-07-12 | Disposition: A | Discharge status: Home / Self Care | Payer: Medicare Other | Attending: Hospice and Palliative Medicine | Admitting: Hospice and Palliative Medicine

## 2022-07-12 ENCOUNTER — Ambulatory Visit
Admission: RE | Admit: 2022-07-12 | Discharge: 2022-07-12 | Disposition: A | Discharge status: Home / Self Care | Payer: Medicare Other | Source: Ambulatory Visit | Attending: Hospice and Palliative Medicine | Admitting: Hospice and Palliative Medicine

## 2022-07-12 ENCOUNTER — Other Ambulatory Visit: Payer: Self-pay | Admitting: Oncology

## 2022-07-12 ENCOUNTER — Telehealth: Payer: Self-pay | Admitting: *Deleted

## 2022-07-12 DIAGNOSIS — J208 Acute bronchitis due to other specified organisms: Secondary | ICD-10-CM | POA: Diagnosis not present

## 2022-07-12 DIAGNOSIS — J40 Bronchitis, not specified as acute or chronic: Secondary | ICD-10-CM

## 2022-07-12 DIAGNOSIS — R051 Acute cough: Secondary | ICD-10-CM

## 2022-07-12 DIAGNOSIS — C9000 Multiple myeloma not having achieved remission: Secondary | ICD-10-CM

## 2022-07-12 DIAGNOSIS — N183 Chronic kidney disease, stage 3 unspecified: Secondary | ICD-10-CM | POA: Diagnosis not present

## 2022-07-12 DIAGNOSIS — Z9484 Stem cells transplant status: Secondary | ICD-10-CM | POA: Diagnosis not present

## 2022-07-12 NOTE — Telephone Encounter (Signed)
Please postpone tx appts to 10/10, IS has been adjusted. Please inform pt of new appt details

## 2022-07-12 NOTE — Progress Notes (Signed)
Virtual Visit via Telephone Note  I connected with Luis Burke on 07/12/22 at  1:00 PM EDT by telemedicine application and verified that I am speaking with the correct person using two identifiers.  Location: Patient: Home Provider: Clinic   I discussed the limitations of evaluation and management by telemedicine and the availability of in person appointments. The patient expressed understanding and agreed to proceed.  History of Present Illness: Luis Burke is a 66 y.o. male with multiple medical problems including CKD stage III, multiple myeloma.  He is status post stem cell transplant with early relapse.  patient completed 7 cycles of daratumumab/Pomalyst/dexamethasone. He is on treatment with daratumumab maintenance and Xgeva.   Observations/Objective: Patient reports about a week of cough/congestion.  He was seen by PCP on 9/27 and started on doxycycline and antitussives for possible bronchitis.  Today, patient denies significant worsening in symptoms but has had some trace streaks of blood and productive sputum.  I sent patient for an x-ray which showed mild left basilar scarring or atelectasis.  Patient denies shortness of breath.  No fever or chills.  He does continue to have cough but is taking his antitussives and antibiotics.  Patient was sent for COVID PCR testing, which was negative.  Assessment and Plan: Bronchitis -recommend continue antibiotics/antitussives and supportive care.  Reviewed ED triggers in detail with patient who verbalized understanding.  Follow Up Instructions: We will discuss with Dr. Tasia Catchings as patient will likely require rescheduling of his next immunotherapy treatment.   I discussed the assessment and treatment plan with the patient. The patient was provided an opportunity to ask questions and all were answered. The patient agreed with the plan and demonstrated an understanding of the instructions.   The patient was advised to call back or seek an  in-person evaluation if the symptoms worsen or if the condition fails to improve as anticipated.  I provided 10 minutes of non-face-to-face time during this encounter.   Irean Hong, NP

## 2022-07-12 NOTE — Telephone Encounter (Signed)
Returned patient's phone call. Rn Spoke with patient. Patient instructed to obtain a chest xray, covid testing, pcr today. Mychart visit arranged for 1pm today. Patient stated that his home covid testing 48 hours ago was negative. Pt agreeable to plan of care.

## 2022-07-12 NOTE — Telephone Encounter (Signed)
Patient called reporting that he has been dealing with a cold and cough for the past week and has been being treated by his PCP with antibiotics and cough medications. He reports that this morning he is coughing up a little blood streaked mucous an dis asking if he needs to postpone his treatment Tuesday and if he needs to do anything else regarding the cough and blood. Please advise.

## 2022-07-16 ENCOUNTER — Inpatient Hospital Stay: Payer: Medicare Other

## 2022-07-16 ENCOUNTER — Inpatient Hospital Stay: Payer: Medicare Other | Admitting: Oncology

## 2022-07-22 ENCOUNTER — Telehealth: Payer: Self-pay | Admitting: *Deleted

## 2022-07-22 ENCOUNTER — Other Ambulatory Visit: Payer: Self-pay | Admitting: Oncology

## 2022-07-22 NOTE — Telephone Encounter (Signed)
Per Dr. Tasia Catchings, Multiple myeloma levels will be rechecked at tomorrow's visit and if they are less than 400, then he will be scheduled for IVIG.

## 2022-07-22 NOTE — Telephone Encounter (Signed)
Call returned to patient and informed of response. He said sounds good, thank you

## 2022-07-22 NOTE — Telephone Encounter (Signed)
Patient called reporting that he has an appointment tomorrow for lab. Doctor/infusion and states that he is still coughing. He is asking if he can get an immunotherapy infusion tomorow to help with his cough

## 2022-07-23 ENCOUNTER — Inpatient Hospital Stay: Payer: Medicare Other | Attending: Oncology

## 2022-07-23 ENCOUNTER — Encounter: Payer: Self-pay | Admitting: Oncology

## 2022-07-23 ENCOUNTER — Inpatient Hospital Stay (HOSPITAL_BASED_OUTPATIENT_CLINIC_OR_DEPARTMENT_OTHER): Payer: Medicare Other | Admitting: Oncology

## 2022-07-23 ENCOUNTER — Ambulatory Visit
Admission: RE | Admit: 2022-07-23 | Discharge: 2022-07-23 | Disposition: A | Discharge status: Home / Self Care | Payer: Medicare Other | Attending: Oncology | Admitting: Oncology

## 2022-07-23 ENCOUNTER — Ambulatory Visit
Admission: RE | Admit: 2022-07-23 | Discharge: 2022-07-23 | Disposition: A | Discharge status: Home / Self Care | Payer: Medicare Other | Source: Ambulatory Visit | Attending: Oncology | Admitting: Oncology

## 2022-07-23 ENCOUNTER — Inpatient Hospital Stay: Payer: Medicare Other

## 2022-07-23 VITALS — BP 129/85 | HR 66 | Temp 98.2°F | Resp 18 | Wt 201.0 lb

## 2022-07-23 DIAGNOSIS — D6959 Other secondary thrombocytopenia: Secondary | ICD-10-CM | POA: Diagnosis not present

## 2022-07-23 DIAGNOSIS — J069 Acute upper respiratory infection, unspecified: Secondary | ICD-10-CM | POA: Insufficient documentation

## 2022-07-23 DIAGNOSIS — Z9481 Bone marrow transplant status: Secondary | ICD-10-CM | POA: Diagnosis not present

## 2022-07-23 DIAGNOSIS — Z5112 Encounter for antineoplastic immunotherapy: Secondary | ICD-10-CM | POA: Insufficient documentation

## 2022-07-23 DIAGNOSIS — C9000 Multiple myeloma not having achieved remission: Secondary | ICD-10-CM

## 2022-07-23 DIAGNOSIS — Z5111 Encounter for antineoplastic chemotherapy: Secondary | ICD-10-CM | POA: Diagnosis not present

## 2022-07-23 DIAGNOSIS — U071 COVID-19: Secondary | ICD-10-CM | POA: Diagnosis not present

## 2022-07-23 DIAGNOSIS — R771 Abnormality of globulin: Secondary | ICD-10-CM

## 2022-07-23 DIAGNOSIS — R051 Acute cough: Secondary | ICD-10-CM

## 2022-07-23 DIAGNOSIS — R052 Subacute cough: Secondary | ICD-10-CM

## 2022-07-23 DIAGNOSIS — N1831 Chronic kidney disease, stage 3a: Secondary | ICD-10-CM | POA: Diagnosis not present

## 2022-07-23 DIAGNOSIS — D696 Thrombocytopenia, unspecified: Secondary | ICD-10-CM | POA: Diagnosis not present

## 2022-07-23 DIAGNOSIS — I129 Hypertensive chronic kidney disease with stage 1 through stage 4 chronic kidney disease, or unspecified chronic kidney disease: Secondary | ICD-10-CM | POA: Diagnosis not present

## 2022-07-23 LAB — COMPREHENSIVE METABOLIC PANEL
ALT: 14 U/L (ref 0–44)
AST: 21 U/L (ref 15–41)
Albumin: 4.5 g/dL (ref 3.5–5.0)
Alkaline Phosphatase: 64 U/L (ref 38–126)
Anion gap: 7 (ref 5–15)
BUN: 20 mg/dL (ref 8–23)
CO2: 25 mmol/L (ref 22–32)
Calcium: 9.1 mg/dL (ref 8.9–10.3)
Chloride: 105 mmol/L (ref 98–111)
Creatinine, Ser: 1.44 mg/dL — ABNORMAL HIGH (ref 0.61–1.24)
GFR, Estimated: 54 mL/min — ABNORMAL LOW (ref >60)
Glucose, Bld: 107 mg/dL — ABNORMAL HIGH (ref 70–99)
Potassium: 3.9 mmol/L (ref 3.5–5.1)
Sodium: 137 mmol/L (ref 135–145)
Total Bilirubin: 0.9 mg/dL (ref 0.3–1.2)
Total Protein: 7 g/dL (ref 6.5–8.1)

## 2022-07-23 LAB — CBC WITH DIFFERENTIAL/PLATELET
Abs Immature Granulocytes: 0.01 10*3/uL (ref 0.00–0.07)
Basophils Absolute: 0 10*3/uL (ref 0.0–0.1)
Basophils Relative: 0 %
Eosinophils Absolute: 0.1 10*3/uL (ref 0.0–0.5)
Eosinophils Relative: 1 %
HCT: 41.8 % (ref 39.0–52.0)
Hemoglobin: 14.3 g/dL (ref 13.0–17.0)
Immature Granulocytes: 0 %
Lymphocytes Relative: 30 %
Lymphs Abs: 2 10*3/uL (ref 0.7–4.0)
MCH: 33.6 pg (ref 26.0–34.0)
MCHC: 34.2 g/dL (ref 30.0–36.0)
MCV: 98.1 fL (ref 80.0–100.0)
Monocytes Absolute: 0.5 10*3/uL (ref 0.1–1.0)
Monocytes Relative: 7 %
Neutro Abs: 4.2 10*3/uL (ref 1.7–7.7)
Neutrophils Relative %: 62 %
Platelets: 105 10*3/uL — ABNORMAL LOW (ref 150–400)
RBC: 4.26 MIL/uL (ref 4.22–5.81)
RDW: 12.7 % (ref 11.5–15.5)
WBC: 6.8 10*3/uL (ref 4.0–10.5)
nRBC: 0 % (ref 0.0–0.2)

## 2022-07-23 MED ORDER — DEXAMETHASONE 4 MG PO TABS
20.0000 mg | ORAL_TABLET | Freq: Once | ORAL | Status: AC
  Administered 2022-07-23: 20 mg via ORAL
  Filled 2022-07-23: qty 5

## 2022-07-23 MED ORDER — DARATUMUMAB-HYALURONIDASE-FIHJ 1800-30000 MG-UT/15ML ~~LOC~~ SOLN
1800.0000 mg | Freq: Once | SUBCUTANEOUS | Status: AC
  Administered 2022-07-23: 1800 mg via SUBCUTANEOUS
  Filled 2022-07-23: qty 15

## 2022-07-23 MED ORDER — DIPHENHYDRAMINE HCL 25 MG PO CAPS
50.0000 mg | ORAL_CAPSULE | Freq: Once | ORAL | Status: AC
  Administered 2022-07-23: 50 mg via ORAL
  Filled 2022-07-23: qty 2

## 2022-07-23 NOTE — Assessment & Plan Note (Signed)
IVIG if IgG <400   

## 2022-07-23 NOTE — Assessment & Plan Note (Signed)
He has finished a course of antibiotics Recheck CXR

## 2022-07-23 NOTE — Assessment & Plan Note (Signed)
Majority of his lucencies were not hypermetabolic on previous PET scan Xgeva monthly- hold for now due to recent dental procedure Continue calcium and vitamin D supplementation.

## 2022-07-23 NOTE — Assessment & Plan Note (Signed)
avoid nephrotoxin.  Encourage hydration. 

## 2022-07-23 NOTE — Assessment & Plan Note (Signed)
Kappa Light chain multiple myeloma beta 2 microglobulin 5 and normal albumin.  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  06/04/2021 5% plasma cells -August 2022-February 2023, patient completed 7 cycles of daratumumab/Pomalyst/dexamethasone. 11/20/2021, status post bone marrow biopsy which showed 1% of plasma cells.  Likely he has minimal residual disease. Currently on daratumumab maintenance. Labs are reviewed and discussed with patient. light chain ratio stable Proceed with daratumumab today .   

## 2022-07-23 NOTE — Progress Notes (Signed)
Hematology/Oncology Progress note [phone] [phone]      Patient Care Team: Tonia Ghent, MD as PCP - General (Family Medicine) Earlie Server, MD as Consulting Physician (Oncology) Beather Arbour Lilyan Punt, MD as Referring Physician (Hematology and Oncology) Diannia Ruder, MD as Referring Physician (Hematology and Oncology)  REASON FOR VISIT Follow up for multiple myeloma.   ASSESSMENT & PLAN:  Multiple myeloma (HCC) Kappa Light chain multiple myeloma beta 2 microglobulin 5 and normal albumin.  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  06/04/2021 5% plasma cells -August 2022-February 2023, patient completed 7 cycles of daratumumab/Pomalyst/dexamethasone. 11/20/2021, status post bone marrow biopsy which showed 1% of plasma cells.  Likely he has minimal residual disease. Currently on daratumumab maintenance. Labs are reviewed and discussed with patient. light chain ratio stable Proceed with daratumumab today  Thrombocytopenia ([place]) Chemotherapy induced. Stable. Monitor counts.   Encounter for antineoplastic chemotherapy Majority of his lucencies were not hypermetabolic on previous PET scan Xgeva monthly- hold for now due to recent dental procedure Continue calcium and vitamin D supplementation.   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  Encourage hydration.  Acute cough He has finished a course of antibiotics Recheck CXR   Orders Placed This Encounter  Procedures   DG Chest 2 View    Standing Status:   Future    Number of Occurrences:   1    Standing Expiration Date:   07/23/2023    Order Specific Question:   Reason for Exam (SYMPTOM  OR DIAGNOSIS REQUIRED)    Answer:   cough    Order Specific Question:   Preferred  imaging location?    Answer:   [facility]    Follow up in 4 weeks, Lab MD Dara  All questions were answered. The patient knows to call the clinic with any problems, questions or concerns.  Earlie Server, MD, PhD [facility] Hematology Oncology 07/23/2022   HISTORY OF PRESENTING ILLNESS:  Luis Burke is a  66 y.o.  male with PMH listed below presents for follow up of multiple myeloma.  Oncology History  Multiple myeloma ([place])  08/19/2018 Bone Marrow Biopsy   08/19/2018 bone marrow biopsy showed hypercellular marrow 80%, involved by plasma cell neoplasm up to 95%.  Consistent with plasma cell myeloma. Myeloma FISH  gain of gain of CEP12   08/25/2018 Imaging   PET scan showed no definite hypermetabolic bone disease but CT findings are highly suspicious for numerous small myelomatous lesions involving the spine, sternum and scattered ribs.   08/26/2018 Initial Diagnosis   Multiple myeloma  # 07/27/2018 multiple myeloma panel showed M protein of 0.1, IgG 662, IgA 49, IgM 7. 08/10/2018, free light chain ratio showed extremely high level of kappa free light chain 10,183, with a kappa lambda light chain ratio of 1414.31,LDH 164, Beta-2 microglobulin 5     08/28/2018 - 03/17/2019 Chemotherapy   RVD x 9     04/23/2019 Bone Marrow Transplant   autologous stem cell bone marrow transplant at [facility].  He received preparative regimen with melphalan 200 mg/m on 04/22/2019 followed by autologous stem cell infusion on 04/23/2019. Transplant course was complicated with febrile neutropenia grade 3, treated with vancomycin and cephapirin until engraftment on 05/06/2019. Patient also had engraftment syndrome and had to be started on Solu-Medrol  25 mg twice daily on 05/05/2019.  Transition to Medrol Dosepak on 05/07/2019 to complete steroid taper as outpatient.  re-vaccination 12 months post transplant   08/10/2019 - 05/2022 Chemotherapy    Ixazomib 12m on D1/D8/D15 out of 28 day cycle       07/12/2020  Imaging   [facility]  Lucent lesion in the L4 spinous process measuring 4 mm which is  nonspecific. Consider confirmation of marrow replacing process with MRI.  2.  Incompletely characterized renal cysts    07/28/2020 Bone Marrow Biopsy    bone marrow normocellular marrow with polytypic plasmacytosis, 3 to 8%   Patient is in remission.  Cytogenetics negative for trisomy 12.  Negative myeloma FISH panel.   08/05/2020 Imaging   [facility].  Lumbar spondylosis is most pronounced at L5-S1 where there is severe right and moderate left neural foraminal stenosis. 08/11/2020, x-ray cervical spine complete with flexion and extension showed The cervical spine is visualized from C1 to the top of T1 on the lateral  view.  Reversal of the normal cervical lordosis with a focal kyphosis centered at the C5-C6 intervertebral level. Trace anterolisthesis of C4 onto C5. Trace retrolisthesis of C6 on C7. No evidence of dynamic instability is noted on  the flexion or extension views.   No prevertebral soft tissue swelling. Cervical vertebral body heights are maintained. Multilevel degenerative changes of the cervical spine including discogenic disease, most pronounced at C5-C6 and C6-C7, and facet arthropathy most pronounced at C4-C5. Multilevel mild to moderate left neural foraminal osseous narrowing of the mid to lower cervical vertebrae, possibly centrally/artifactual by patient positioning.     05/15/2021 Bone Marrow Biopsy   Bone marrow biopsy showed slightly hypercellular bone marrow for age with slight plasmacytosis.  5% plasma cells with kappa light chain restriction.  Cytogenetics normal.  Myeloma FISH negative I discussed with DGlen White we both feel that his bone marrow biopsy result reflects early relapse. Recommend stop Ixazomib. Switch to second line treatment with Daratumumab  Pomlyst Dexamethasone   05/31/2021 - 05/20/2022 Chemotherapy   Patient is on Treatment Plan : MYELOMA Daratumumab + Pomalidomide + Dexamethasone q28d x 7 cycles     05/31/2021 -  Chemotherapy   Patient is on Treatment Plan : MYELOMA Daratumumab IV + Pomalidomide + Dexamethasone q28d x 7 cycles     09/21/2021 Imaging    PET scan showed no evidence of suspicious or new hypermetabolism, scattered small lucent foci seen previously in the sternum and the spine are less prominent today and have resolved in some regions.  Aortic atherosclerosis.   11/18/2021 Imaging   MRI cervical spine with and without contrast showed no multiple myeloma lesion or marrow edema identified in the cervical spine.  Abnormal dural thickening and a/or abnormal epidural space at C4/C5 levels.  This is nonspecific.  ER physician discussed the case with neurosurgery Dr. YCari Carawaywho feels that this is more likely degenerative disc disease.   11/20/2021 Bone Marrow Biopsy   bone marrow biopsy which showed 1% of plasma cells. The plasma cells generally display polyclonal staining pattern for kappa and lambda light chains although a few very small clusters appear kappa light chain restricted.  The findings are very limited but most suggestive of minimal residual plasma cell neoplasm especially in the presence of monoclonal protein with kappa light chain specificity. Normal cytogenetics and myeloma FISH  12/20/2021 -  Chemotherapy   Daratumumab monthly maintenance  # I discussed with patient's [facility] oncologist Dr. Maylene Roes.  We have agreed upon de-escalating his regimen to Daratumumab monthly maintenance for now. Discontinue pomalyst.     Metastasis to bone ([place])   #01/14/2022, received IVIG 400 mg/kg x1   INTERVAL HISTORY BRANDLEY ALDRETE is a 66 y.o. male who has above history reviewed by me today for follow-up multiple myeloma.  Denies any nausea vomiting diarrhea today + Tooth extraction with dry socket.  Still some tenderness  around the area. + During interval patient had URI/bronchitis chest x-ray was obtained and was negative.  Patient was treated with empiric course of doxycycline.  Cough is productive, with greenish sputum, improving however he continues to have lingering cough.   Review of Systems  Constitutional:  Negative for appetite change, chills, fatigue, fever and unexpected weight change.  HENT:   Negative for hearing loss.   Eyes:  Negative for eye problems and icterus.  Respiratory:  Negative for chest tightness, cough and shortness of breath.   Cardiovascular:  Negative for chest pain and leg swelling.  Gastrointestinal:  Negative for abdominal distention and abdominal pain.  Endocrine: Negative for hot flashes.  Genitourinary:  Negative for difficulty urinating, dysuria and frequency.   Musculoskeletal:  Positive for back pain and neck pain. Negative for arthralgias.  Skin:  Negative for itching and rash.  Neurological:  Negative for light-headedness and numbness.  Hematological:  Negative for adenopathy. Does not bruise/bleed easily.  Psychiatric/Behavioral:  Negative for confusion.     MEDICAL HISTORY:  Past Medical History:  Diagnosis Date   AKI (acute kidney injury) ([place]) 01/06/2019   Anemia    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[place])    autologous stem cell bone marrow transplant on 04/23/2019.   Fatty liver    on u/s 07/2018   Hyperlipidemia 03/2004   Hypertension 1994   Multiple myeloma ([place])    Snores     SURGICAL HISTORY: Past Surgical History:  Procedure Laterality Date   CATARACT EXTRACTION Left 10/10/2021   CATARACT EXTRACTION W/PHACO Right 03/04/2018   Procedure: CATARACT EXTRACTION PHACO AND INTRAOCULAR LENS PLACEMENT ([place])  RIGHT TORIC;  Surgeon: Leandrew Koyanagi, MD;  Location: [place];  Service: Ophthalmology;  Laterality: Right;  [place] no Toric Lens 1:45 5.3   CATARACT EXTRACTION W/PHACO Left 10/10/2021   Procedure: CATARACT EXTRACTION PHACO AND  INTRAOCULAR LENS PLACEMENT ([place]) LEFT;  Surgeon: Leandrew Koyanagi, MD;  Location: [place];  Service: Ophthalmology;  Laterality: Left;  5.16 1:03.8   COLONOSCOPY WITH PROPOFOL N/A 07/20/2018   Procedure: COLONOSCOPY WITH PROPOFOL;  Surgeon: Jonathon Bellows, MD;  Location[facility] ENDOSCOPY;  Service: Gastroenterology;  Laterality: N/A;   ESOPHAGOGASTRODUODENOSCOPY (EGD) WITH PROPOFOL N/A 07/20/2018   Procedure: ESOPHAGOGASTRODUODENOSCOPY (EGD) WITH PROPOFOL;  Surgeon: Jonathon Bellows, MD;  Location[facility] ENDOSCOPY;  Service: Gastroenterology;  Laterality: N/A;   EYE SURGERY Right    HERNIA REPAIR     L Lap herniorraphy  04/2000   Left wrist ganglionectomy      SOCIAL HISTORY: Social History   Socioeconomic History   Marital status: Married    Spouse name: Not on file   Number of children: 1   Years of education: Not on file   Highest education level: Not on file  Occupational History   Occupation: [employer]  Tobacco Use   Smoking status: Never   Smokeless tobacco: Never   Tobacco comments:    Occassionally  Vaping Use  Vaping Use: Never used  Substance and Sexual Activity   Alcohol use: Not Currently    Alcohol/week: 3.0 standard drinks of alcohol    Types: 3 Cans of beer per week   Drug use: No   Sexual activity: Not on file  Other Topics Concern   Not on file  Social History Narrative   Divorced 2009, married 06/17/2021.     His daughter died 4 days after giving birth to the patient's granddaughter   Has joint custody of his deceased daughter's child   Worked at [employer] is [place], retired 2020.     Social Determinants of Health   Financial Resource Strain: Not on file  Food Insecurity: Not on file  Transportation Needs: Not on file  Physical Activity: Not on file  Stress: Not on file  Social Connections: Not on file  Intimate Partner Violence: Not on file    FAMILY HISTORY: Family History  Problem Relation Age of Onset   Hypertension Mother     Diabetes Mother    Heart disease Mother        CAD   Dementia Mother    Prostate cancer Father    Hypertension Father    Heart disease Father        MI 02/03   Colon cancer Father    Hypertension Sister    Hypertension Sister    Hypertension Sister     ALLERGIES:  is allergic to bactrim [sulfamethoxazole-trimethoprim], clonidine derivatives, and tadalafil.  MEDICATIONS:  Current Outpatient Medications  Medication Sig Dispense Refill   acetaminophen (TYLENOL) 325 MG tablet Take 2 tablets (650 mg total) by mouth See admin instructions. 1 hour prior to chemotherapy treatments 30 tablet 0   acetaminophen-codeine (TYLENOL #3) 300-30 MG tablet Take 1 tablet by mouth every 8 (eight) hours as needed for moderate pain. 10 tablet 0   acyclovir (ZOVIRAX) 400 MG tablet Take 1 tablet (400 mg total) by mouth 2 (two) times daily. 60 tablet 2   amLODipine (NORVASC) 5 MG tablet TAKE 2 TABLETS BY MOUTH EVERY DAY 180 tablet 1   ASPIRIN 81 PO Take 81 mg by mouth daily.     B Complex Vitamins (VITAMIN B COMPLEX PO) Take 1 Dose by mouth daily.     chlorhexidine (PERIDEX) 0.12 % solution Use as directed 15 mLs in the mouth or throat 2 (two) times daily. 473 mL 1   co-enzyme Q-10 50 MG capsule Take 50 mg by mouth daily.     [employer] VITAMIN B12 1000 MCG tablet TAKE 1 TABLET BY MOUTH EVERY DAY 90 tablet 3   Denosumab (XGEVA [place]) Inject into the skin every 30 (thirty) days. Last rcvd 09/27/21     dexamethasone (DECADRON) 4 MG tablet Take 5 tablets (20 mg total) by mouth once a week. Take at least 1 hour prior to infusion appt. Take with food. To start on 06/07/21. 60 tablet 0   diphenhydrAMINE (BENADRYL) 50 MG tablet Take 1 tablet (50 mg total) by mouth See admin instructions. Take 1 tablet 1 hour prior to chemotherapy treatments. 30 tablet 0   hydrALAZINE (APRESOLINE) 10 MG tablet TAKE 1 TABLET(10 MG) BY MOUTH THREE TIMES DAILY 270 tablet 1   lisinopril (ZESTRIL) 20 MG tablet TAKE 1 TABLET BY MOUTH EVERY DAY 90  tablet 0   magnesium chloride (SLOW-MAG) 64 MG TBEC SR tablet Take 1 tablet (64 mg total) by mouth daily. 60 tablet 0   metoprolol succinate (TOPROL-XL) 50 MG 24 hr tablet TAKE 4  TABLETS BY MOUTH EVERY DAY WITH OR IMMEDIATELY FOLLOWING A MEAL 360 tablet 2   montelukast (SINGULAIR) 10 MG tablet Take 1 tablet (10 mg total) by mouth See admin instructions. Take 1day prior to chemotherapy 30 tablet 1   pravastatin (PRAVACHOL) 10 MG tablet TAKE 1 TABLET BY MOUTH EVERY DAY 90 tablet 3   zinc gluconate 50 MG tablet Take 50 mg by mouth daily.     benzonatate (TESSALON) 200 MG capsule Take 1 capsule (200 mg total) by mouth 3 (three) times daily as needed for cough. (Patient not taking: Reported on 07/23/2022) 40 capsule 1   guaiFENesin-codeine (ROBITUSSIN AC) 100-10 MG/5ML syrup Take 5 mLs by mouth 3 (three) times daily as needed for cough. (Patient not taking: Reported on 07/23/2022) 120 mL 0   ondansetron (ZOFRAN) 8 MG tablet Take 1 tablet (8 mg total) by mouth every 8 (eight) hours as needed. for nausea (Patient not taking: Reported on 07/23/2022) 30 tablet 1   oxyCODONE (OXY IR/ROXICODONE) 5 MG immediate release tablet Take 1 tablet (5 mg total) by mouth every 4 (four) hours as needed for severe pain. (Patient not taking: Reported on 07/23/2022) 20 tablet 0   prochlorperazine (COMPAZINE) 10 MG tablet  (Patient not taking: Reported on 07/23/2022)     sildenafil (REVATIO) 20 MG tablet Take 1-5 tablets (20-100 mg total) by mouth daily as needed. (Patient not taking: Reported on 07/23/2022) 50 tablet 12   No current facility-administered medications for this visit.   Facility-Administered Medications Ordered in Other Visits  Medication Dose Route Frequency Provider Last Rate Last Admin   0.9 %  sodium chloride infusion   Intravenous Once Earlie Server, MD         PHYSICAL EXAMINATION: ECOG PERFORMANCE STATUS: 1 - Symptomatic but completely ambulatory Vitals:   07/23/22 0902  BP: 129/85  Pulse: 66  Resp:  18  Temp: 98.2 F (36.8 C)  SpO2: 99%   Filed Weights   07/23/22 0859  Weight: 201 lb (91.2 kg)    Physical Exam Constitutional:      General: He is not in acute distress. HENT:     Head: Normocephalic and atraumatic.  Eyes:     General: No scleral icterus. Cardiovascular:     Rate and Rhythm: Normal rate and regular rhythm.     Heart sounds: Normal heart sounds.  Pulmonary:     Effort: Pulmonary effort is normal. No respiratory distress.     Breath sounds: No wheezing.  Abdominal:     General: Bowel sounds are normal. There is no distension.     Palpations: Abdomen is soft.  Musculoskeletal:        General: No deformity. Normal range of motion.     Cervical back: Normal range of motion and neck supple.  Skin:    General: Skin is warm and dry.     Findings: No erythema or rash.  Neurological:     Mental Status: He is alert and oriented to person, place, and time. Mental status is at baseline.     Cranial Nerves: No cranial nerve deficit.     Coordination: Coordination normal.  Psychiatric:        Mood and Affect: Mood normal.      LABORATORY DATA:  I have reviewed the data as listed Lab Results  Component Value Date   WBC 6.8 07/23/2022   HGB 14.3 07/23/2022   HCT 41.8 07/23/2022   MCV 98.1 07/23/2022   PLT 105 (L) 07/23/2022  Recent Labs    05/20/22 0844 06/18/22 0846 07/23/22 0838  NA 140 136 137  K 4.0 3.8 3.9  CL 108 102 105  CO2 24 23 25   GLUCOSE 112* 120* 107*  BUN 23 21 20   CREATININE 1.50* 1.49* 1.44*  CALCIUM 9.6 9.8 9.1  GFRNONAA 51* 51* 54*  PROT 6.8 7.9 7.0  ALBUMIN 4.5 4.7 4.5  AST 22 26 21   ALT 24 26 14   ALKPHOS 62 57 64  BILITOT 0.5 0.6 0.9    Iron/TIBC/Ferritin/ %Sat    Component Value Date/Time   IRON 74 07/01/2018 0944   TIBC 250 07/01/2018 0944   FERRITIN 357 07/01/2018 0944   IRONPCTSAT 30 07/01/2018 0944    Lab Results  Component Value Date   TOTALPROTELP 6.4 05/20/2022   ALBUMINELP 4.0 03/08/2021   A1GS 0.2  03/08/2021   A2GS 0.7 03/08/2021   BETS 1.1 03/08/2021   GAMS 1.1 03/08/2021   MSPIKE Not Observed 03/08/2021   SPEI Comment 03/08/2021   Lab Results  Component Value Date   KPAFRELGTCHN 11.3 06/18/2022   LAMBDASER 6.8 06/18/2022   KAPLAMBRATIO 1.66 (H) 06/18/2022    RADIOGRAPHIC STUDIES: I have personally reviewed the radiological images as listed and agreed with the findings in the report. DG Chest 2 View  Result Date: 07/12/2022 CLINICAL DATA:  Cough with blood in mucus.  Bronchitis. EXAM: CHEST - 2 VIEW COMPARISON:  PET-09/20/2021.  Chest radiographs 10/23/2018. FINDINGS: The cardiomediastinal silhouette is unchanged with normal heart size. Mild scarring or atelectasis is noted in the left lung base. The right lung is clear. No pleural effusion or pneumothorax is identified. No acute osseous abnormality is seen. IMPRESSION: Mild left basilar scarring or atelectasis. Electronically Signed   By: Logan Bores M.D.   On: 07/12/2022 11:31   CT MAXILLOFACIAL WO CONTRAST  Result Date: 06/14/2022 CLINICAL DATA:  Left jaw pain. EXAM: CT MAXILLOFACIAL WITHOUT CONTRAST TECHNIQUE: Multidetector CT imaging of the maxillofacial structures was performed. Multiplanar CT image reconstructions were also generated. RADIATION DOSE REDUCTION: This exam was performed according to the departmental dose-optimization program which includes automated exposure control, adjustment of the mA and/or kV according to patient size and/or use of iterative reconstruction technique. COMPARISON:  None Available. FINDINGS: Osseous: No fracture or mandibular dislocation. No destructive process. Orbits: Negative. No traumatic or inflammatory finding. Sinuses: Clear. Soft tissues: Negative. Limited intracranial: No significant or unexpected finding. IMPRESSION: No definite abnormality seen in the maxillofacial region. Electronically Signed   By: Marijo Conception M.D.   On: 06/14/2022 14:01

## 2022-07-23 NOTE — Patient Instructions (Signed)
[facility]CER CTR AT [place]  Discharge Instructions: Thank you for choosing [facility] to provide your oncology and hematology care.  If you have a lab appointment with the [facility], please go directly to the [facility] and check in at the registration area.  Wear comfortable clothing and clothing appropriate for easy access to any Portacath or PICC line.   We strive to give you quality time with your provider. You may need to reschedule your appointment if you arrive late (15 or more minutes).  Arriving late affects you and other patients whose appointments are after yours.  Also, if you miss three or more appointments without notifying the office, you may be dismissed from the clinic at the provider's discretion.      For prescription refill requests, have your pharmacy contact our office and allow 72 hours for refills to be completed.    Today you received the following chemotherapy and/or immunotherapy agents daratumumab-hyaluronidase-fihj (DARAZALEX FASPRO).      To help prevent nausea and vomiting after your treatment, we encourage you to take your nausea medication as directed.  BELOW ARE SYMPTOMS THAT SHOULD BE REPORTED IMMEDIATELY: *FEVER GREATER THAN 100.4 F (38 C) OR HIGHER *CHILLS OR SWEATING *NAUSEA AND VOMITING THAT IS NOT CONTROLLED WITH YOUR NAUSEA MEDICATION *UNUSUAL SHORTNESS OF BREATH *UNUSUAL BRUISING OR BLEEDING *URINARY PROBLEMS (pain or burning when urinating, or frequent urination) *BOWEL PROBLEMS (unusual diarrhea, constipation, pain near the anus) TENDERNESS IN MOUTH AND THROAT WITH OR WITHOUT PRESENCE OF ULCERS (sore throat, sores in mouth, or a toothache) UNUSUAL RASH, SWELLING OR PAIN  UNUSUAL VAGINAL DISCHARGE OR ITCHING   Items with * indicate a potential emergency and should be followed up as soon as possible or go to the Emergency Department if any problems should occur.  Please show the CHEMOTHERAPY ALERT CARD or  IMMUNOTHERAPY ALERT CARD at check-in to the Emergency Department and triage nurse.  Should you have questions after your visit or need to cancel or reschedule your appointment, please contact [facility] CANCER [place] AT [place]  [phone] and follow the prompts.  Office hours are 8:00 a.m. to 4:30 p.m. Monday - Friday. Please note that voicemails left after 4:00 p.m. may not be returned until the following business day.  We are closed weekends and major holidays. You have access to a nurse at all times for urgent questions. Please call the main number to the clinic [phone] and follow the prompts.  For any non-urgent questions, you may also contact your provider using MyChart. We now offer e-Visits for anyone 85 and older to request care online for non-urgent symptoms. For details visit [email or web address].   Also download the MyChart app! Go to the app store, search "MyChart", open the app, select [facility] [place], and log in with your MyChart username and password.  Masks are optional in the cancer centers. If you would like for your care team to wear a mask while they are taking care of you, please let them know. For doctor visits, patients may have with them one support person who is at least 66 years old. At this time, visitors are not allowed in the infusion area.

## 2022-07-23 NOTE — Assessment & Plan Note (Signed)
Chemotherapy induced. Stable. Monitor counts.  

## 2022-07-23 NOTE — Progress Notes (Signed)
Still coughing, still taking robitussin done with antibiotics. Wants to ask about cherry tussin prescription.

## 2022-07-24 LAB — KAPPA/LAMBDA LIGHT CHAINS
Kappa free light chain: 11.1 mg/L (ref 3.3–19.4)
Kappa, lambda light chain ratio: 1.85 — ABNORMAL HIGH (ref 0.26–1.65)
Lambda free light chains: 6 mg/L (ref 5.7–26.3)

## 2022-07-25 ENCOUNTER — Encounter: Payer: Self-pay | Admitting: Family Medicine

## 2022-07-25 ENCOUNTER — Ambulatory Visit (INDEPENDENT_AMBULATORY_CARE_PROVIDER_SITE_OTHER): Payer: Medicare Other | Admitting: Family Medicine

## 2022-07-25 VITALS — BP 120/72 | HR 59 | Temp 97.7°F | Ht 70.0 in | Wt 205.0 lb

## 2022-07-25 DIAGNOSIS — I10 Essential (primary) hypertension: Secondary | ICD-10-CM | POA: Diagnosis not present

## 2022-07-25 DIAGNOSIS — Z0000 Encounter for general adult medical examination without abnormal findings: Secondary | ICD-10-CM

## 2022-07-25 DIAGNOSIS — Z23 Encounter for immunization: Secondary | ICD-10-CM

## 2022-07-25 DIAGNOSIS — E785 Hyperlipidemia, unspecified: Secondary | ICD-10-CM | POA: Diagnosis not present

## 2022-07-25 DIAGNOSIS — R059 Cough, unspecified: Secondary | ICD-10-CM

## 2022-07-25 DIAGNOSIS — Z7189 Other specified counseling: Secondary | ICD-10-CM

## 2022-07-25 MED ORDER — HYDRALAZINE HCL 10 MG PO TABS: ORAL_TABLET | ORAL | 3 refills | Status: DC

## 2022-07-25 MED ORDER — METOPROLOL SUCCINATE ER 50 MG PO TB24: ORAL_TABLET | ORAL | 3 refills | Status: DC

## 2022-07-25 MED ORDER — LISINOPRIL 20 MG PO TABS: 20.0000 mg | ORAL_TABLET | Freq: Every day | ORAL | 3 refills | Status: DC

## 2022-07-25 MED ORDER — PRAVASTATIN SODIUM 10 MG PO TABS: 10.0000 mg | ORAL_TABLET | Freq: Every day | ORAL | 3 refills | Status: DC

## 2022-07-25 MED ORDER — AMLODIPINE BESYLATE 5 MG PO TABS: 10.0000 mg | ORAL_TABLET | Freq: Every day | ORAL | 3 refills | Status: DC

## 2022-07-25 NOTE — Progress Notes (Signed)
I have personally reviewed the Medicare Annual Wellness questionnaire and have noted 1. The patient's medical and social history 2. Their use of alcohol, tobacco or illicit drugs 3. Their current medications and supplements 4. The patient's functional ability including ADL's, fall risks, home safety risks and hearing or visual             impairment. 5. Diet and physical activities 6. Evidence for depression or mood disorders  The patients weight, height, BMI have been recorded in the chart and visual acuity is per eye clinic.  I have made referrals, counseling and provided education to the patient based review of the above and I have provided the pt with a written personalized care plan for preventive services.  Provider list updated- see scanned forms.  Routine anticipatory guidance given to patient.  See health maintenance. The possibility exists that previously documented standard health maintenance information may have been brought forward from a previous encounter into this note.  If needed, that same information has been updated to reflect the current situation based on today's encounter.    Flu 2023 Shingles previously done PNA previously done Tetanus 2019 COVID-vaccine previously done Colonoscopy 2019 Prostate cancer screening 2023 Advance directive-wife designated if patient were incapacitated. Cognitive function addressed- see scanned forms- and if abnormal then additional documentation follows.   In addition to [facility]ness, follow up visit for the below conditions:  Hematology f/u d/w pt. I will defer regarding his treatment.  He agrees.  Hypertension:    Using medication without problems or lightheadedness:  yes Chest pain with exertion:no Edema:no Short of breath:no Labs d/w pt.    Elevated Cholesterol: Using medications without problems: yes Muscle aches: no Diet compliance: yes Exercise: yes  He had a dry socket after dental work.  Fortunately he is  improving in the meantime.  Recent CXR d/w pt.  His breathing is clearly better, cough is clearly better.  No fevers.  No sputum now.  Prev sputum resolved days ago.   Granddaughter is done with driver's ed, doing well.   PMH and SH reviewed  Meds, vitals, and allergies reviewed.   ROS: Per HPI.  Unless specifically indicated otherwise in HPI, the patient denies:  General: fever. Eyes: acute vision changes ENT: sore throat Cardiovascular: chest pain Respiratory: SOB GI: vomiting GU: dysuria Musculoskeletal: acute back pain Derm: acute rash Neuro: acute motor dysfunction Psych: worsening mood Endocrine: polydipsia Heme: bleeding Allergy: hayfever  GEN: nad, alert and oriented HEENT: ncat NECK: supple w/o LA CV: rrr. PULM: ctab, no inc wob ABD: soft, +bs EXT: no edema SKIN: no acute rash

## 2022-07-25 NOTE — Patient Instructions (Signed)
Let me check on getting the lipids done at the blood clinic.  Update me as needed.  Thanks for your effort.  Flu shot today.  Take care.  Glad to see you.

## 2022-07-28 NOTE — Assessment & Plan Note (Signed)
Continue pravastatin.  I will check with hematology clinic to see if he can get lipids drawn there.

## 2022-07-28 NOTE — Assessment & Plan Note (Signed)
Advance directive- wife designated if patient were incapacitated.  

## 2022-07-28 NOTE — Assessment & Plan Note (Signed)
Flu 2023 Shingles previously done PNA previously done Tetanus 2019 COVID-vaccine previously done Colonoscopy 2019 Prostate cancer screening 2023 Advance directive-wife designated if patient were incapacitated. Cognitive function addressed- see scanned forms- and if abnormal then additional documentation follows.

## 2022-07-28 NOTE — Assessment & Plan Note (Signed)
Noted that his breathing is clearly better.  Cough is clearly better.  No fevers or sputum now.  Lungs are clear.

## 2022-07-28 NOTE — Assessment & Plan Note (Signed)
Continue amlodipine hydralazine and metoprolol.  Previous labs discussed with patient.

## 2022-07-29 ENCOUNTER — Encounter: Payer: Self-pay | Admitting: Oncology

## 2022-07-29 ENCOUNTER — Other Ambulatory Visit: Payer: Self-pay | Admitting: Oncology

## 2022-07-29 DIAGNOSIS — C9000 Multiple myeloma not having achieved remission: Secondary | ICD-10-CM

## 2022-07-29 LAB — MULTIPLE MYELOMA PANEL, SERUM
Albumin SerPl Elph-Mcnc: 3.9 g/dL (ref 2.9–4.4)
Albumin/Glob SerPl: 1.6 (ref 0.7–1.7)
Alpha 1: 0.3 g/dL (ref 0.0–0.4)
Alpha2 Glob SerPl Elph-Mcnc: 0.7 g/dL (ref 0.4–1.0)
B-Globulin SerPl Elph-Mcnc: 0.9 g/dL (ref 0.7–1.3)
Gamma Glob SerPl Elph-Mcnc: 0.6 g/dL (ref 0.4–1.8)
Globulin, Total: 2.5 g/dL (ref 2.2–3.9)
IgA: 69 mg/dL (ref 61–437)
IgG (Immunoglobin G), Serum: 621 mg/dL (ref 603–1613)
IgM (Immunoglobulin M), Srm: 6 mg/dL — ABNORMAL LOW (ref 20–172)
Total Protein ELP: 6.4 g/dL (ref 6.0–8.5)

## 2022-07-30 ENCOUNTER — Telehealth: Payer: Self-pay | Admitting: *Deleted

## 2022-07-30 ENCOUNTER — Emergency Department: Payer: Medicare Other

## 2022-07-30 ENCOUNTER — Emergency Department
Admission: EM | Admit: 2022-07-30 | Discharge: 2022-07-30 | Disposition: A | Discharge status: Home / Self Care | Payer: Medicare Other | Attending: Emergency Medicine | Admitting: Emergency Medicine

## 2022-07-30 ENCOUNTER — Other Ambulatory Visit: Payer: Self-pay

## 2022-07-30 ENCOUNTER — Encounter: Payer: Self-pay | Admitting: Emergency Medicine

## 2022-07-30 ENCOUNTER — Telehealth: Payer: Self-pay | Admitting: Oncology

## 2022-07-30 DIAGNOSIS — R531 Weakness: Secondary | ICD-10-CM | POA: Diagnosis present

## 2022-07-30 DIAGNOSIS — U071 COVID-19: Secondary | ICD-10-CM | POA: Insufficient documentation

## 2022-07-30 LAB — CBC
HCT: 40.7 % (ref 39.0–52.0)
Hemoglobin: 13.6 g/dL (ref 13.0–17.0)
MCH: 33.3 pg (ref 26.0–34.0)
MCHC: 33.4 g/dL (ref 30.0–36.0)
MCV: 99.5 fL (ref 80.0–100.0)
Platelets: 75 10*3/uL — ABNORMAL LOW (ref 150–400)
RBC: 4.09 MIL/uL — ABNORMAL LOW (ref 4.22–5.81)
RDW: 13.1 % (ref 11.5–15.5)
WBC: 4.9 10*3/uL (ref 4.0–10.5)
nRBC: 0 % (ref 0.0–0.2)

## 2022-07-30 LAB — BASIC METABOLIC PANEL
Anion gap: 6 (ref 5–15)
BUN: 18 mg/dL (ref 8–23)
CO2: 25 mmol/L (ref 22–32)
Calcium: 9.8 mg/dL (ref 8.9–10.3)
Chloride: 105 mmol/L (ref 98–111)
Creatinine, Ser: 1.4 mg/dL — ABNORMAL HIGH (ref 0.61–1.24)
GFR, Estimated: 55 mL/min — ABNORMAL LOW (ref >60)
Glucose, Bld: 108 mg/dL — ABNORMAL HIGH (ref 70–99)
Potassium: 4.2 mmol/L (ref 3.5–5.1)
Sodium: 136 mmol/L (ref 135–145)

## 2022-07-30 LAB — URINALYSIS, ROUTINE W REFLEX MICROSCOPIC
Bilirubin Urine: NEGATIVE
Glucose, UA: NEGATIVE mg/dL
Hgb urine dipstick: NEGATIVE
Ketones, ur: NEGATIVE mg/dL
Leukocytes,Ua: NEGATIVE
Nitrite: NEGATIVE
Protein, ur: NEGATIVE mg/dL
Specific Gravity, Urine: 1.019 (ref 1.005–1.030)
pH: 5 (ref 5.0–8.0)

## 2022-07-30 LAB — RESP PANEL BY RT-PCR (FLU A&B, COVID) ARPGX2
Influenza A by PCR: NEGATIVE
Influenza B by PCR: NEGATIVE
SARS Coronavirus 2 by RT PCR: POSITIVE — AB

## 2022-07-30 MED ORDER — NIRMATRELVIR/RITONAVIR (PAXLOVID) TABLET (RENAL DOSING): 2.0000 | ORAL_TABLET | Freq: Two times a day (BID) | ORAL | 0 refills | Status: AC

## 2022-07-30 NOTE — ED Triage Notes (Signed)
Patient to ED via POV for generalized weakness, chills, SOB, bodyaches. Cancer doc sent patient to ED for evaluations. Patient had chemo last Tuesday and had flu shot on Thursday. Aox4 and ambulatory to triage.

## 2022-07-30 NOTE — ED Notes (Signed)
20g IV removed from left AC, gauze and tape applied.  IV intact, no bleeding at this time.

## 2022-07-30 NOTE — Discharge Instructions (Addendum)
Your labs, chest XR, and exam are overall normal and reassuring.  Your COVID test did come back as positive.  You continue to monitor and treat your symptoms including Tylenol for fevers and over-the-counter cough medicine.  Take the Paxlovid as directed.  Follow-up with your primary provider or oncologist for ongoing evaluation.  Return to ED if needed.

## 2022-07-30 NOTE — ED Provider Notes (Signed)
[facility] Emergency Department Provider Note     Event Date/Time   First MD Initiated Contact with Patient 07/30/22 1941     (approximate)   History   Weakness   HPI  Luis Burke is a 66 y.o. male presents to the ED with complaints of generalized weakness, chills, shortness of breath, and bodyaches.  Patient's oncologist into the hospital for evaluation of his symptoms.  He had chemo last Tuesday, and received his flu vaccine on Thursday.  Patient denies any other symptoms at this time.     Physical Exam   Triage Vital Signs: ED Triage Vitals  Enc Vitals Group     BP 07/30/22 1536 (!) 154/105     Pulse Rate 07/30/22 1536 85     Resp 07/30/22 1536 18     Temp 07/30/22 1536 99.1 F (37.3 C)     Temp Source 07/30/22 1536 Oral     SpO2 07/30/22 1536 95 %     Weight 07/30/22 1537 205 lb (93 kg)     Height 07/30/22 1537 '5\' 10"'$  (1.778 m)     Head Circumference --      Peak Flow --      Pain Score 07/30/22 1536 7     Pain Loc --      Pain Edu? --      Excl. in [place]? --     Most recent vital signs: Vitals:   07/30/22 1536 07/30/22 1952  BP: (!) 154/105 (!) 154/90  Pulse: 85 85  Resp: 18 16  Temp: 99.1 F (37.3 C) 99.1 F (37.3 C)  SpO2: 95% 95%    General Awake, no distress. NAD HEENT NCAT. PERRL. EOMI. No rhinorrhea. Mucous membranes are moist. * CV:  Good peripheral perfusion.  RESP:  Normal effort.  ABD:  No distention.    ED Results / Procedures / Treatments   Labs (all labs ordered are listed, but only abnormal results are displayed) Labs Reviewed  RESP PANEL BY RT-PCR (FLU A&B, COVID) ARPGX2 - Abnormal; Notable for the following components:      Result Value   SARS Coronavirus 2 by RT PCR POSITIVE (*)    All other components within normal limits  BASIC METABOLIC PANEL - Abnormal; Notable for the following components:   Glucose, Bld 108 (*)    Creatinine, Ser 1.40 (*)    GFR, Estimated 55 (*)    All other  components within normal limits  CBC - Abnormal; Notable for the following components:   RBC 4.09 (*)    Platelets 75 (*)    All other components within normal limits  URINALYSIS, ROUTINE W REFLEX MICROSCOPIC - Abnormal; Notable for the following components:   Color, Urine YELLOW (*)    APPearance CLEAR (*)    All other components within normal limits     EKG  Vent. rate 87 BPM PR interval 156 ms QRS duration 84 ms QT/QTcB 352/423 ms P-R-T axes 53 18 53  RADIOLOGY  I personally viewed and evaluated these images as part of my medical decision making, as well as reviewing the written report by the radiologist.  ED Provider Interpretation: no acute findings  No results found.   PROCEDURES:  Critical Care performed: No  Procedures   MEDICATIONS ORDERED IN ED: Medications - No data to display   IMPRESSION / MDM / [facility] / ED COURSE  I reviewed the triage vital signs and the nursing notes.  Differential diagnosis includes, but is not limited to, COVID, flu, viral URI, bronchitis, CAP, electrolyte abnormality, sepsis  Patient's presentation is most consistent with acute complicated illness / injury requiring diagnostic workup.  Patient to the ED for evaluation of acute symptoms of generalized weakness, chills, and body aches.  He is evaluated for his complaints and is found to have overall reassuring work-up.  No significant lab abnormalities.  No leukocytosis or critical anemia noted.  Patient did have a positive coronavirus result on his viral panel screen.  His chest x-ray is without evidence of infectious consolidation, based on my review and interpretation of images.  Patient's diagnosis is consistent with coronavirus. Patient will be discharged home with prescriptions for Paxlovid. Patient is to follow up with primary provider or oncologist as needed or otherwise directed. Patient is given ED precautions to return to the ED for  any worsening or new symptoms.     FINAL CLINICAL IMPRESSION(S) / ED DIAGNOSES   Final diagnoses:  [id number]     Rx / DC Orders   ED Discharge Orders          Ordered    nirmatrelvir/ritonavir EUA, renal dosing, (PAXLOVID) 10 x 150 MG & 10 x '100MG'$  TABS  2 times daily        07/30/22 1945             Note:  This document was prepared using Dragon voice recognition software and may include unintentional dictation errors.    Melvenia Needles, PA-C 07/31/22 1919    Rada Hay, MD 08/02/22 [phone]

## 2022-07-30 NOTE — ED Provider Triage Note (Signed)
Emergency Medicine Provider Triage Evaluation Note  Luis Burke, a 66 y.o. male  was evaluated in triage.  Pt complains of generalized weakness, chills, and SOB.  Patient is referred to the ED by his cancer doctor for evaluation of cough and congestion.  The patient had chemo last Thursday and also received flu shot at that same time.  Review of Systems  Positive: Cough, SOB Negative: Fevers  Physical Exam  BP (!) 154/105 (BP Location: Left Arm)   Pulse 85   Temp 99.1 F (37.3 C) (Oral)   Resp 18   Ht '5\' 10"'$  (1.778 m)   Wt 93 kg   SpO2 95%   BMI 29.41 kg/m  Gen:   Awake, no distress  NAD Resp:  Normal effort CTA MSK:   Moves extremities without difficulty  Other:    Medical Decision Making  Medically screening exam initiated at 3:52 PM.  Appropriate orders placed.  Luis Burke was informed that the remainder of the evaluation will be completed by another provider, this initial triage assessment does not replace that evaluation, and the importance of remaining in the ED until their evaluation is complete.  Patient to the ED for evaluation of persistent cough, shortness of breath, and chills.  Patient was evaluated by his cancer doctor, told report to the ED for further evaluation.   Melvenia Needles, PA-C 07/30/22 1554

## 2022-07-30 NOTE — Telephone Encounter (Signed)
Patient called reporting that his cough is worse, he is having chills and feels fevered, but has not checked his temp. He states he as on a coat and is under 2 blankets right now and does not feel like getting up to find the thermometer.He said he feels so bad that he actually started crying earlier and thought about calling EMS, but decided not to  He is asking if we could order something for him. His wife is going to bring home some Delsym when she gets off work. He states that he was feeling better until he took the Flu vaccine Thursday. Please advise

## 2022-07-30 NOTE — Telephone Encounter (Signed)
I called patient. He feels poorly today, chills, feeling feverish, cough is much worse.  Home testing for COVID 19 was negative last week.  With his immunocompromised state, I recommend him to go to ER for further evaluation. Suspect pneumonia.  I called ER and spoke with Kennyth Lose.

## 2022-07-31 ENCOUNTER — Inpatient Hospital Stay (HOSPITAL_BASED_OUTPATIENT_CLINIC_OR_DEPARTMENT_OTHER): Payer: Medicare Other | Admitting: Medical Oncology

## 2022-07-31 ENCOUNTER — Telehealth: Payer: Self-pay | Admitting: *Deleted

## 2022-07-31 DIAGNOSIS — Z5112 Encounter for antineoplastic immunotherapy: Secondary | ICD-10-CM | POA: Diagnosis not present

## 2022-07-31 DIAGNOSIS — U071 COVID-19: Secondary | ICD-10-CM | POA: Diagnosis not present

## 2022-07-31 DIAGNOSIS — Z79899 Other long term (current) drug therapy: Secondary | ICD-10-CM | POA: Diagnosis not present

## 2022-07-31 NOTE — Progress Notes (Signed)
Symptom Management [place] at [facility] [phone] [phone]  Patient Care Team: Tonia Ghent, MD as PCP - General (Family Medicine) Earlie Server, MD as Consulting Physician (Oncology) Beather Arbour Lilyan Punt, MD as Referring Physician (Hematology and Oncology) Diannia Ruder, MD as Referring Physician (Hematology and Oncology)   Name of the patient: Luis Burke  [id number]  27-Jul-1956   Date of visit: 07/31/22  Reason for Consult: KAVIR SAVOCA is a 66 y.o. male who presents today for:   Medication management: Patient with a history of multiple myeloma not on remission. Was found to be positive for COVID-19 on last night in the ER. He reported chills, fever, headaches, feeling poorly. Symptoms started on last Thursday however he did have his influenza vaccination at that time. He was given Paxlovix by ER and told to contact our office to ensure he can take this medication. HE started this medication this morning and is tolerating it well. He is also taking delsym as well. Things are improving. BP have been stable.   Denies any neurologic complaints. Denies recent fevers or illnesses. Denies any easy bleeding or bruising. Reports good appetite and denies weight loss. Denies chest pain. Denies any nausea, vomiting, constipation, or diarrhea. Denies urinary complaints. Patient offers no further specific complaints today.    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   Fatty liver    on u/s 07/2018   Foot fracture    Hyperlipidemia 03/2004   Hypertension 1994   Multiple myeloma ([place])    Snores     PAST SURGICAL HISTORY:  Past Surgical History:  Procedure Laterality Date   CATARACT EXTRACTION Left 10/10/2021   CATARACT EXTRACTION W/PHACO Right  03/04/2018   Procedure: CATARACT EXTRACTION PHACO AND INTRAOCULAR LENS PLACEMENT ([place])  RIGHT TORIC;  Surgeon: Leandrew Koyanagi, MD;  Location: [place];  Service: Ophthalmology;  Laterality: Right;  [place] no Toric Lens 1:45 5.3   CATARACT EXTRACTION W/PHACO Left 10/10/2021   Procedure: CATARACT EXTRACTION PHACO AND INTRAOCULAR LENS PLACEMENT ([place]) LEFT;  Surgeon: Leandrew Koyanagi, MD;  Location: [place];  Service: Ophthalmology;  Laterality: Left;  5.16 1:03.8   COLONOSCOPY WITH PROPOFOL N/A 07/20/2018   Procedure: COLONOSCOPY WITH PROPOFOL;  Surgeon: Jonathon Bellows, MD;  Location: [facility] ENDOSCOPY;  Service: Gastroenterology;  Laterality: N/A;   ESOPHAGOGASTRODUODENOSCOPY (EGD) WITH PROPOFOL N/A 07/20/2018   Procedure: ESOPHAGOGASTRODUODENOSCOPY (EGD) WITH PROPOFOL;  Surgeon: Jonathon Bellows, MD;  Location: [facility] ENDOSCOPY;  Service: Gastroenterology;  Laterality: N/A;   EYE SURGERY Right    HERNIA REPAIR     L Lap herniorraphy  04/2000   Left wrist ganglionectomy      HEMATOLOGY/ONCOLOGY HISTORY:  Oncology History  Multiple myeloma ([place])  08/19/2018 Bone Marrow Biopsy   08/19/2018 bone marrow biopsy showed hypercellular marrow 80%, involved by plasma cell neoplasm up to 95%.  Consistent with plasma cell myeloma. Myeloma FISH  gain of gain of CEP12   08/25/2018 Imaging   PET scan showed no definite hypermetabolic bone disease but CT findings are highly suspicious for numerous small myelomatous lesions involving the spine, sternum and scattered ribs.   08/26/2018 Initial Diagnosis   Multiple myeloma  # 07/27/2018 multiple myeloma panel showed M protein of 0.1, IgG 662, IgA 49, IgM 7. 08/10/2018, free light chain  ratio showed extremely high level of kappa free light chain 10,183, with a kappa lambda light chain ratio of 1414.31,LDH 164, Beta-2 microglobulin 5     08/28/2018 - 03/17/2019 Chemotherapy   RVD x 9     04/23/2019 Bone Marrow Transplant   autologous  stem cell bone marrow transplant at [facility].  He received preparative regimen with melphalan 200 mg/m on 04/22/2019 followed by autologous stem cell infusion on 04/23/2019. Transplant course was complicated with febrile neutropenia grade 3, treated with vancomycin and cephapirin until engraftment on 05/06/2019. Patient also had engraftment syndrome and had to be started on Solu-Medrol 25 mg twice daily on 05/05/2019.  Transition to Medrol Dosepak on 05/07/2019 to complete steroid taper as outpatient.  re-vaccination 12 months post transplant   08/10/2019 - 05/2022 Chemotherapy    Ixazomib 67m on D1/D8/D15 out of 28 day cycle       07/12/2020 Imaging   [facility]  Lucent lesion in the L4 spinous process measuring 4 mm which is  nonspecific. Consider confirmation of marrow replacing process with MRI.  2.  Incompletely characterized renal cysts    07/28/2020 Bone Marrow Biopsy    bone marrow normocellular marrow with polytypic plasmacytosis, 3 to 8%   Patient is in remission.  Cytogenetics negative for trisomy 12.  Negative myeloma FISH panel.   08/05/2020 Imaging   [facility] of [facility].  Lumbar spondylosis is most pronounced at L5-S1 where there is severe right and moderate left neural foraminal stenosis. 08/11/2020, x-ray cervical spine complete with flexion and extension showed The cervical spine is visualized from C1 to the top of T1 on the lateral  view.  Reversal of the normal cervical lordosis with a focal kyphosis centered at the C5-C6 intervertebral level. Trace anterolisthesis of C4 onto C5. Trace retrolisthesis of C6 on C7. No evidence of dynamic instability is noted on  the flexion or extension views.   No prevertebral soft tissue swelling. Cervical vertebral body heights are maintained. Multilevel degenerative changes of the cervical spine including discogenic disease, most pronounced at  C5-C6 and C6-C7, and facet arthropathy most pronounced at C4-C5. Multilevel mild to moderate left neural foraminal osseous narrowing of the mid to lower cervical vertebrae, possibly centrally/artifactual by patient positioning.     05/15/2021 Bone Marrow Biopsy   Bone marrow biopsy showed slightly hypercellular bone marrow for age with slight plasmacytosis.  5% plasma cells with kappa light chain restriction.  Cytogenetics normal.  Myeloma FISH negative [facility] both feel that his bone marrow biopsy result reflects early relapse. Recommend stop Ixazomib. Switch to second line treatment with Daratumumab [place] Dexamethasone   05/31/2021 - 05/20/2022 Chemotherapy   Patient is on Treatment Plan : MYELOMA Daratumumab + Pomalidomide + Dexamethasone q28d x 7 cycles     05/31/2021 -  Chemotherapy   Patient is on Treatment Plan : MYELOMA Daratumumab IV + Pomalidomide + Dexamethasone q28d x 7 cycles     09/21/2021 Imaging    PET scan showed no evidence of suspicious or new hypermetabolism, scattered small lucent foci seen previously in the sternum and the spine are less prominent today and have resolved in some regions.  Aortic atherosclerosis.   11/18/2021 Imaging   MRI cervical spine with and without contrast showed no multiple myeloma lesion or marrow edema identified in the cervical spine.  Abnormal dural thickening and a/or abnormal epidural  space at C4/C5 levels.  This is nonspecific.  ER physician discussed the case with neurosurgery Dr. Cari Caraway who feels that this is more likely degenerative disc disease.   11/20/2021 Bone Marrow Biopsy   bone marrow biopsy which showed 1% of plasma cells. The plasma cells generally display polyclonal staining pattern for kappa and lambda light chains although a few very small clusters appear kappa light chain restricted.  The findings are very limited but most suggestive of minimal residual plasma cell neoplasm especially in the  presence of monoclonal protein with kappa light chain specificity. Normal cytogenetics and myeloma FISH   12/20/2021 -  Chemotherapy   Daratumumab monthly maintenance  # I discussed with patient's [facility] oncologist Dr. Maylene Roes.  We have agreed upon de-escalating his regimen to Daratumumab monthly maintenance for now. Discontinue pomalyst.     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  MEDICATIONS:  Current Outpatient Medications  Medication Sig Dispense Refill   acetaminophen (TYLENOL) 325 MG tablet Take 2 tablets (650 mg total) by mouth See admin instructions. 1 hour prior to chemotherapy treatments 30 tablet 0   acyclovir (ZOVIRAX) 400 MG tablet Take 1 tablet (400 mg total) by mouth 2 (two) times daily. 60 tablet 2   amLODipine (NORVASC) 5 MG tablet Take 2 tablets (10 mg total) by mouth daily. 180 tablet 3   ASPIRIN 81 PO Take 81 mg by mouth daily.     B Complex Vitamins (VITAMIN B COMPLEX PO) Take 1 Dose by mouth daily.     co-enzyme Q-10 50 MG capsule Take 50 mg by mouth daily.     [employer] VITAMIN B12 1000 MCG tablet TAKE 1 TABLET BY MOUTH EVERY DAY 90 tablet 3   Denosumab (XGEVA [place]) Inject into the skin every 30 (thirty) days. Last rcvd 09/27/21     dexamethasone (DECADRON) 4 MG tablet Take 5 tablets (20 mg total) by mouth once a week. Take at least 1 hour prior to infusion appt. Take with food. To start on 06/07/21. 60 tablet 0   diphenhydrAMINE (BENADRYL) 50 MG tablet Take 1 tablet (50 mg total) by mouth See admin instructions. Take 1 tablet 1 hour prior to chemotherapy treatments. 30 tablet 0   guaiFENesin-codeine (ROBITUSSIN AC) 100-10 MG/5ML syrup Take 5 mLs by mouth 3 (three) times daily as needed for cough. 120 mL 0   hydrALAZINE (APRESOLINE) 10 MG tablet 1 tab 3 times a day. 270 tablet 3   lisinopril (ZESTRIL) 20 MG tablet Take 1 tablet (20 mg total) by mouth daily. 90 tablet 3   magnesium  chloride (SLOW-MAG) 64 MG TBEC SR tablet Take 1 tablet (64 mg total) by mouth daily. 60 tablet 0   metoprolol succinate (TOPROL-XL) 50 MG 24 hr tablet TAKE 4 TABLETS BY MOUTH EVERY DAY WITH OR IMMEDIATELY FOLLOWING A MEAL 360 tablet 3   montelukast (SINGULAIR) 10 MG tablet Take 1 tablet (10 mg total) by mouth See admin instructions. Take 1day prior to chemotherapy 30 tablet 1   nirmatrelvir/ritonavir EUA, renal dosing, (PAXLOVID) 10 x 150 MG & 10 x 100MG TABS Take 2 tablets by mouth 2 (two) times daily for 5 days. Patient GFR is 55. Take nirmatrelvir (150 mg) one tablet twice daily for 5 days and ritonavir (100 mg) one tablet twice daily for 5 days. 20 tablet 0   ondansetron (ZOFRAN) 8 MG tablet Take 1 tablet (8 mg total) by mouth every 8 (eight) hours as  needed. for nausea 30 tablet 1   pravastatin (PRAVACHOL) 10 MG tablet Take 1 tablet (10 mg total) by mouth daily. 90 tablet 3   prochlorperazine (COMPAZINE) 10 MG tablet      sildenafil (REVATIO) 20 MG tablet Take 1-5 tablets (20-100 mg total) by mouth daily as needed. 50 tablet 12   zinc gluconate 50 MG tablet Take 50 mg by mouth daily.     No current facility-administered medications for this visit.   Facility-Administered Medications Ordered in Other Visits  Medication Dose Route Frequency Provider Last Rate Last Admin   0.9 %  sodium chloride infusion   Intravenous Once Earlie Server, MD        VITAL SIGNS: There were no vitals taken for this visit. There were no vitals filed for this visit.  Estimated body mass index is 29.41 kg/m as calculated from the following:   Height as of 07/30/22: 5' 10"  (1.778 m).   Weight as of 07/30/22: 205 lb (93 kg).  LABS: CBC:    Component Value Date/Time   WBC 4.9 07/30/2022 1540   HGB 13.6 07/30/2022 1540   HCT 40.7 07/30/2022 1540   PLT 75 (L) 07/30/2022 1540   MCV 99.5 07/30/2022 1540   NEUTROABS 4.2 07/23/2022 0838   LYMPHSABS 2.0 07/23/2022 0838   MONOABS 0.5 07/23/2022 0838   EOSABS 0.1  07/23/2022 0838   BASOSABS 0.0 07/23/2022 0838   Comprehensive Metabolic Panel:    Component Value Date/Time   NA 136 07/30/2022 1540   K 4.2 07/30/2022 1540   CL 105 07/30/2022 1540   CO2 25 07/30/2022 1540   BUN 18 07/30/2022 1540   CREATININE 1.40 (H) 07/30/2022 1540   GLUCOSE 108 (H) 07/30/2022 1540   CALCIUM 9.8 07/30/2022 1540   AST 21 07/23/2022 0838   ALT 14 07/23/2022 0838   ALKPHOS 64 07/23/2022 0838   BILITOT 0.9 07/23/2022 0838   PROT 7.0 07/23/2022 0838   ALBUMIN 4.5 07/23/2022 0838    RADIOGRAPHIC STUDIES: DG Chest 2 View  Result Date: 07/30/2022 CLINICAL DATA:  cough, SOB, h/o multiple myeloma EXAM: CHEST - 2 VIEW COMPARISON:  Radiograph 07/23/2022 FINDINGS: The heart size and mediastinal contours are within normal limits.No focal airspace disease. No pleural effusion or pneumothorax.Thoracic spondylosis. No acute osseous abnormality. IMPRESSION: No evidence of acute cardiopulmonary disease. Electronically Signed   By: Maurine Simmering M.D.   On: 07/30/2022 16:28   DG Chest 2 View  Result Date: 07/24/2022 CLINICAL DATA:  History of cough and congestion. History of hypertension and multiple myeloma. EXAM: CHEST - 2 VIEW COMPARISON:  Chest x-ray 07/12/2022, 10/23/2018.  PET-CT 09/20/2021. FINDINGS: Mediastinum and hilar structures normal. Heart size normal. No pulmonary venous congestion. Low lung volumes. Bilateral pleural-parenchymal thickening consistent with scarring again noted. Mild right base infiltrate cannot be excluded. No pleural effusion or pneumothorax. Degenerative change thoracic spine. No acute or focal bony abnormality is identified. IMPRESSION: Low lung volumes. Bilateral pleural-parenchymal thickening consistent with scarring again noted. Mild right base infiltrate cannot be excluded. Electronically Signed   By: Marcello Moores  Register M.D.   On: 07/24/2022 08:48   DG Chest 2 View  Result Date: 07/12/2022 CLINICAL DATA:  Cough with blood in mucus.  Bronchitis.  EXAM: CHEST - 2 VIEW COMPARISON:  PET-09/20/2021.  Chest radiographs 10/23/2018. FINDINGS: The cardiomediastinal silhouette is unchanged with normal heart size. Mild scarring or atelectasis is noted in the left lung base. The right lung is clear. No pleural effusion or pneumothorax is identified. No  acute osseous abnormality is seen. IMPRESSION: Mild left basilar scarring or atelectasis. Electronically Signed   By: Logan Bores M.D.   On: 07/12/2022 11:31    PERFORMANCE STATUS (ECOG) : 1 - Symptomatic but completely ambulatory  Review of Systems Unless otherwise noted, a complete review of systems is negative.  Physical Exam General: NAD. AAx3  Assessment and Plan- Patient is a 66 y.o. male    Encounter Diagnoses  Name Primary?   COVID-19 Yes   Medication management    We reviewed basic CDC guidelines regarding his COVID-19 diagnosis and reviewed his Paxlovid including rebound. We also discussed his medications. According to pharmacy, "He would need to hold his sildenafil while on the Paxlovid. Pax may increase his amlodipine, recommended to monitor for increased amlodipine effects and toxicities (eg, hypotension, edema) and consider a 50% amlodipine dose reduction. ". He reports that he is not using the sildenafil and will monitor his BP. He is feeling better on the Paxlovid. We will see him back on the 7th for her previously scheduled visit.    Patient expressed understanding and was in agreement with this plan. He also understands that He can call clinic at any time with any questions, concerns, or complaints.   Thank you for allowing me to participate in the care of this very pleasant patient.   Time Total: 10  Visit consisted of counseling and education dealing with the complex and emotionally intense issues of symptom management in the setting of serious illness.Greater than 50%  of this time was spent counseling and coordinating care related to the above assessment and  plan.  Signed by: Nelwyn Salisbury, PA-C

## 2022-07-31 NOTE — Telephone Encounter (Signed)
Keep appt with Dr. Tasia Catchings as scheduled in Nov.

## 2022-07-31 NOTE — Telephone Encounter (Signed)
Patient called and reports that he has tested positive for COVID and the ER gave a prescription for Paxlovid and told him to check with Dr Tasia Catchings as to whether he can take this medicine. Please advise  He also reports that his wife has tested positive an is asking what she should do. I will tell him to have her call her PCP.

## 2022-08-12 ENCOUNTER — Encounter: Payer: Self-pay | Admitting: Hospice and Palliative Medicine

## 2022-08-12 ENCOUNTER — Inpatient Hospital Stay (HOSPITAL_BASED_OUTPATIENT_CLINIC_OR_DEPARTMENT_OTHER): Payer: Medicare Other | Admitting: Hospice and Palliative Medicine

## 2022-08-12 ENCOUNTER — Telehealth: Payer: Self-pay | Admitting: *Deleted

## 2022-08-12 ENCOUNTER — Ambulatory Visit
Admission: RE | Admit: 2022-08-12 | Discharge: 2022-08-12 | Disposition: A | Discharge status: Home / Self Care | Payer: Medicare Other | Source: Ambulatory Visit | Attending: Hospice and Palliative Medicine | Admitting: Hospice and Palliative Medicine

## 2022-08-12 ENCOUNTER — Ambulatory Visit
Admission: RE | Admit: 2022-08-12 | Discharge: 2022-08-12 | Disposition: A | Discharge status: Home / Self Care | Payer: Medicare Other | Attending: Hospice and Palliative Medicine | Admitting: Hospice and Palliative Medicine

## 2022-08-12 VITALS — BP 129/74 | HR 80 | Temp 98.5°F | Resp 20 | Wt 198.0 lb

## 2022-08-12 DIAGNOSIS — J069 Acute upper respiratory infection, unspecified: Secondary | ICD-10-CM | POA: Insufficient documentation

## 2022-08-12 DIAGNOSIS — C9000 Multiple myeloma not having achieved remission: Secondary | ICD-10-CM | POA: Diagnosis not present

## 2022-08-12 DIAGNOSIS — Z8616 Personal history of COVID-19: Secondary | ICD-10-CM

## 2022-08-12 DIAGNOSIS — R053 Chronic cough: Secondary | ICD-10-CM | POA: Insufficient documentation

## 2022-08-12 DIAGNOSIS — Z5112 Encounter for antineoplastic immunotherapy: Secondary | ICD-10-CM | POA: Diagnosis not present

## 2022-08-12 MED ORDER — AMOXICILLIN-POT CLAVULANATE 875-125 MG PO TABS: 1.0000 | ORAL_TABLET | Freq: Two times a day (BID) | ORAL | 0 refills | Status: DC

## 2022-08-12 MED ORDER — ALBUTEROL SULFATE HFA 108 (90 BASE) MCG/ACT IN AERS: 2.0000 | INHALATION_SPRAY | Freq: Four times a day (QID) | RESPIRATORY_TRACT | 2 refills | Status: DC | PRN

## 2022-08-12 MED ORDER — PREDNISONE 20 MG PO TABS: ORAL_TABLET | ORAL | 0 refills | Status: DC

## 2022-08-12 MED ORDER — HYDROCOD POLI-CHLORPHE POLI ER 10-8 MG/5ML PO SUER: 5.0000 mL | Freq: Every evening | ORAL | 0 refills | Status: DC | PRN

## 2022-08-12 NOTE — Telephone Encounter (Signed)
Patient advised to obtain a stat cxr and to come to clinic today by 230 pm for evaluation

## 2022-08-12 NOTE — Progress Notes (Signed)
Symptom Management [place] at [facility] [phone] [phone]  Patient Care Team: Tonia Ghent, MD as PCP - General (Family Medicine) Earlie Server, MD as Consulting Physician (Oncology) Beather Arbour Lilyan Punt, MD as Referring Physician (Hematology and Oncology) Diannia Ruder, MD as Referring Physician (Hematology and Oncology)   NAME OF PATIENT: Luis Burke  [id number]  May 30, 1956   DATE OF VISIT: 08/12/22  REASON FOR CONSULT: Luis Burke is a 66 y.o. male with multiple medical problems including CKD stage III, multiple myeloma.  He is on treatment with daratumumab and Xgeva.  INTERVAL HISTORY: Patient was seen in the ED on 07/30/2022 and tested positive with COVID.  He was subsequently started on treatment with Paxlovid.  He had follow-up with Nelwyn Salisbury, PA-C on 07/31/2022.  Patient presents to [facility] today with complaint of persistent cough and congestion.  Patient has had ongoing upper respiratory issues since his dental surgery in September.  He was given course of doxycycline for presumed bronchitis. Patient says that he has never really fully recovered.   He denies fever or chills.  He is sleeping in a recliner because he feels somewhat short of breath/wheezing with worsening cough at night.  He endorses sinus pressure and congestion with a green productive cough.  Denies any neurologic complaints. Denies recent fevers or illnesses. Denies any easy bleeding or bruising. Reports good appetite and denies weight loss. Denies chest pain. Denies any nausea, vomiting, constipation, or diarrhea. Denies urinary complaints. Patient offers no further specific complaints today.   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   Fatty liver    on u/s  07/2018   Foot fracture    Hyperlipidemia 03/2004   Hypertension 1994   Multiple myeloma ([place])    Snores     PAST SURGICAL HISTORY:  Past Surgical History:  Procedure Laterality Date   CATARACT EXTRACTION Left 10/10/2021   CATARACT EXTRACTION W/PHACO Right 03/04/2018   Procedure: CATARACT EXTRACTION PHACO AND INTRAOCULAR LENS PLACEMENT ([place])  RIGHT TORIC;  Surgeon: Leandrew Koyanagi, MD;  Location: [place];  Service: Ophthalmology;  Laterality: Right;  [place] no Toric Lens 1:45 5.3   CATARACT EXTRACTION W/PHACO Left 10/10/2021   Procedure: CATARACT EXTRACTION PHACO AND INTRAOCULAR LENS PLACEMENT ([place]) LEFT;  Surgeon: Leandrew Koyanagi, MD;  Location: [place];  Service: Ophthalmology;  Laterality: Left;  5.16 1:03.8   COLONOSCOPY WITH PROPOFOL N/A 07/20/2018   Procedure: COLONOSCOPY WITH PROPOFOL;  Surgeon: Jonathon Bellows, MD;  Location: [facility] ENDOSCOPY;  Service: Gastroenterology;  Laterality: N/A;   ESOPHAGOGASTRODUODENOSCOPY (EGD) WITH PROPOFOL N/A 07/20/2018   Procedure: ESOPHAGOGASTRODUODENOSCOPY (EGD) WITH PROPOFOL;  Surgeon: Jonathon Bellows, MD;  Location: [facility] ENDOSCOPY;  Service: Gastroenterology;  Laterality: N/A;   EYE SURGERY Right    HERNIA REPAIR     L Lap herniorraphy  04/2000   Left wrist ganglionectomy      HEMATOLOGY/ONCOLOGY HISTORY:  Oncology History  Multiple myeloma ([place])  08/19/2018 Bone Marrow Biopsy   08/19/2018 bone marrow biopsy showed hypercellular marrow 80%, involved by plasma cell neoplasm up to 95%.  Consistent with plasma cell myeloma. Myeloma FISH  gain of gain of CEP12   08/25/2018 Imaging   PET scan showed no definite hypermetabolic bone disease but CT findings are highly suspicious for numerous small myelomatous  lesions involving the spine, sternum and scattered ribs.   08/26/2018 Initial Diagnosis   Multiple myeloma  # 07/27/2018 multiple myeloma panel showed M protein of 0.1, IgG 662, IgA 49, IgM 7. 08/10/2018, free  light chain ratio showed extremely high level of kappa free light chain 10,183, with a kappa lambda light chain ratio of 1414.31,LDH 164, Beta-2 microglobulin 5     08/28/2018 - 03/17/2019 Chemotherapy   RVD x 9     04/23/2019 Bone Marrow Transplant   autologous stem cell bone marrow transplant at [facility].  He received preparative regimen with melphalan 200 mg/m on 04/22/2019 followed by autologous stem cell infusion on 04/23/2019. Transplant course was complicated with febrile neutropenia grade 3, treated with vancomycin and cephapirin until engraftment on 05/06/2019. Patient also had engraftment syndrome and had to be started on Solu-Medrol 25 mg twice daily on 05/05/2019.  Transition to Medrol Dosepak on 05/07/2019 to complete steroid taper as outpatient.  re-vaccination 12 months post transplant   08/10/2019 - 05/2022 Chemotherapy    Ixazomib 39m on D1/D8/D15 out of 28 day cycle       07/12/2020 Imaging   [facility]  Lucent lesion in the L4 spinous process measuring 4 mm which is  nonspecific. Consider confirmation of marrow replacing process with MRI.  2.  Incompletely characterized renal cysts    07/28/2020 Bone Marrow Biopsy    bone marrow normocellular marrow with polytypic plasmacytosis, 3 to 8%   Patient is in remission.  Cytogenetics negative for trisomy 12.  Negative myeloma FISH panel.   08/05/2020 Imaging   [facility].  Lumbar spondylosis is most pronounced at L5-S1 where there is severe right and moderate left neural foraminal stenosis. 08/11/2020, x-ray cervical spine complete with flexion and extension showed The cervical spine is visualized from C1 to the top of T1 on the lateral  view.  Reversal of the normal cervical lordosis with a focal kyphosis centered at the C5-C6 intervertebral level. Trace anterolisthesis of C4 onto C5. Trace retrolisthesis of C6 on C7. No  evidence of dynamic instability is noted on  the flexion or extension views.   No prevertebral soft tissue swelling. Cervical vertebral body heights are maintained. Multilevel degenerative changes of the cervical spine including discogenic disease, most pronounced at C5-C6 and C6-C7, and facet arthropathy most pronounced at C4-C5. Multilevel mild to moderate left neural foraminal osseous narrowing of the mid to lower cervical vertebrae, possibly centrally/artifactual by patient positioning.     05/15/2021 Bone Marrow Biopsy   Bone marrow biopsy showed slightly hypercellular bone marrow for age with slight plasmacytosis.  5% plasma cells with kappa light chain restriction.  Cytogenetics normal.  Myeloma FISH negative I discussed with DBeverly we both feel that his bone marrow biopsy result reflects early relapse. Recommend stop Ixazomib. Switch to second line treatment with Daratumumab [place] Dexamethasone   05/31/2021 - 05/20/2022 Chemotherapy   Patient is on Treatment Plan : MYELOMA Daratumumab + Pomalidomide + Dexamethasone q28d x 7 cycles     05/31/2021 -  Chemotherapy   Patient is on Treatment Plan : MYELOMA Daratumumab IV + Pomalidomide + Dexamethasone q28d x 7 cycles     09/21/2021 Imaging    PET scan showed no evidence of suspicious or new hypermetabolism, scattered small lucent foci seen previously in the sternum and the spine are less prominent today and have resolved in some  regions.  Aortic atherosclerosis.   11/18/2021 Imaging   MRI cervical spine with and without contrast showed no multiple myeloma lesion or marrow edema identified in the cervical spine.  Abnormal dural thickening and a/or abnormal epidural space at C4/C5 levels.  This is nonspecific.  ER physician discussed the case with neurosurgery Dr. Cari Caraway who feels that this is more likely degenerative disc disease.   11/20/2021 Bone Marrow Biopsy   bone marrow biopsy which showed 1% of plasma cells. The plasma  cells generally display polyclonal staining pattern for kappa and lambda light chains although a few very small clusters appear kappa light chain restricted.  The findings are very limited but most suggestive of minimal residual plasma cell neoplasm especially in the presence of monoclonal protein with kappa light chain specificity. Normal cytogenetics and myeloma FISH   12/20/2021 -  Chemotherapy   Daratumumab monthly maintenance  # I discussed with patient's [facility] oncologist Dr. Maylene Roes.  We have agreed upon de-escalating his regimen to Daratumumab monthly maintenance for now. Discontinue pomalyst.     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  MEDICATIONS:  Current Outpatient Medications  Medication Sig Dispense Refill   acetaminophen (TYLENOL) 325 MG tablet Take 2 tablets (650 mg total) by mouth See admin instructions. 1 hour prior to chemotherapy treatments 30 tablet 0   acyclovir (ZOVIRAX) 400 MG tablet Take 1 tablet (400 mg total) by mouth 2 (two) times daily. 60 tablet 2   amLODipine (NORVASC) 5 MG tablet Take 2 tablets (10 mg total) by mouth daily. 180 tablet 3   ASPIRIN 81 PO Take 81 mg by mouth daily.     B Complex Vitamins (VITAMIN B COMPLEX PO) Take 1 Dose by mouth daily.     co-enzyme Q-10 50 MG capsule Take 50 mg by mouth daily.     [employer] VITAMIN B12 1000 MCG tablet TAKE 1 TABLET BY MOUTH EVERY DAY 90 tablet 3   Denosumab (XGEVA [place]) Inject into the skin every 30 (thirty) days. Last rcvd 09/27/21     diphenhydrAMINE (BENADRYL) 50 MG tablet Take 1 tablet (50 mg total) by mouth See admin instructions. Take 1 tablet 1 hour prior to chemotherapy treatments. 30 tablet 0   hydrALAZINE (APRESOLINE) 10 MG tablet 1 tab 3 times a day. 270 tablet 3   lisinopril (ZESTRIL) 20 MG tablet Take 1 tablet (20 mg total) by mouth daily. 90 tablet 3   magnesium chloride (SLOW-MAG) 64 MG TBEC SR tablet Take 1 tablet  (64 mg total) by mouth daily. 60 tablet 0   metoprolol succinate (TOPROL-XL) 50 MG 24 hr tablet TAKE 4 TABLETS BY MOUTH EVERY DAY WITH OR IMMEDIATELY FOLLOWING A MEAL 360 tablet 3   montelukast (SINGULAIR) 10 MG tablet Take 1 tablet (10 mg total) by mouth See admin instructions. Take 1day prior to chemotherapy 30 tablet 1   ondansetron (ZOFRAN) 8 MG tablet Take 1 tablet (8 mg total) by mouth every 8 (eight) hours as needed. for nausea 30 tablet 1   pravastatin (PRAVACHOL) 10 MG tablet Take 1 tablet (10 mg total) by mouth daily. 90 tablet 3   sildenafil (REVATIO) 20 MG tablet Take 1-5 tablets (20-100 mg total) by mouth daily as needed. 50 tablet 12   zinc gluconate 50 MG tablet Take 50 mg by mouth daily.     dexamethasone (DECADRON) 4 MG tablet Take 5 tablets (20 mg total) by mouth once a week. Take  at least 1 hour prior to infusion appt. Take with food. To start on 06/07/21. (Patient not taking: Reported on 08/12/2022) 60 tablet 0   guaiFENesin-codeine (ROBITUSSIN AC) 100-10 MG/5ML syrup Take 5 mLs by mouth 3 (three) times daily as needed for cough. (Patient not taking: Reported on 08/12/2022) 120 mL 0   prochlorperazine (COMPAZINE) 10 MG tablet  (Patient not taking: Reported on 08/12/2022)     No current facility-administered medications for this visit.   Facility-Administered Medications Ordered in Other Visits  Medication Dose Route Frequency Provider Last Rate Last Admin   0.9 %  sodium chloride infusion   Intravenous Once Earlie Server, MD        VITAL SIGNS: BP 129/74 (BP Location: Left Arm, Patient Position: Sitting)   Pulse 80   Temp 98.5 F (36.9 C) (Tympanic)   Resp 20   Wt 198 lb (89.8 kg)   SpO2 97%   BMI 28.41 kg/m  Filed Weights   08/12/22 1505  Weight: 198 lb (89.8 kg)    Estimated body mass index is 28.41 kg/m as calculated from the following:   Height as of 07/30/22: 5' 10" (1.778 m).   Weight as of this encounter: 198 lb (89.8 kg).  LABS: CBC:    Component Value  Date/Time   WBC 4.9 07/30/2022 1540   HGB 13.6 07/30/2022 1540   HCT 40.7 07/30/2022 1540   PLT 75 (L) 07/30/2022 1540   MCV 99.5 07/30/2022 1540   NEUTROABS 4.2 07/23/2022 0838   LYMPHSABS 2.0 07/23/2022 0838   MONOABS 0.5 07/23/2022 0838   EOSABS 0.1 07/23/2022 0838   BASOSABS 0.0 07/23/2022 0838   Comprehensive Metabolic Panel:    Component Value Date/Time   NA 136 07/30/2022 1540   K 4.2 07/30/2022 1540   CL 105 07/30/2022 1540   CO2 25 07/30/2022 1540   BUN 18 07/30/2022 1540   CREATININE 1.40 (H) 07/30/2022 1540   GLUCOSE 108 (H) 07/30/2022 1540   CALCIUM 9.8 07/30/2022 1540   AST 21 07/23/2022 0838   ALT 14 07/23/2022 0838   ALKPHOS 64 07/23/2022 0838   BILITOT 0.9 07/23/2022 0838   PROT 7.0 07/23/2022 0838   ALBUMIN 4.5 07/23/2022 0838    RADIOGRAPHIC STUDIES: DG Chest 2 View  Result Date: 08/12/2022 CLINICAL DATA:  Productive cough and shortness of breath for 1 month. EXAM: CHEST - 2 VIEW COMPARISON:  Chest radiograph 07/30/2022 FINDINGS: The cardiomediastinal silhouette is normal. There is no focal consolidation or pulmonary edema. There is no pleural effusion or pneumothorax There is no acute osseous abnormality. IMPRESSION: Stable chest with no radiographic evidence of acute cardiopulmonary process. Electronically Signed   By: Valetta Mole M.D.   On: 08/12/2022 13:54   DG Chest 2 View  Result Date: 07/30/2022 CLINICAL DATA:  cough, SOB, h/o multiple myeloma EXAM: CHEST - 2 VIEW COMPARISON:  Radiograph 07/23/2022 FINDINGS: The heart size and mediastinal contours are within normal limits.No focal airspace disease. No pleural effusion or pneumothorax.Thoracic spondylosis. No acute osseous abnormality. IMPRESSION: No evidence of acute cardiopulmonary disease. Electronically Signed   By: Maurine Simmering M.D.   On: 07/30/2022 16:28   DG Chest 2 View  Result Date: 07/24/2022 CLINICAL DATA:  History of cough and congestion. History of hypertension and multiple myeloma.  EXAM: CHEST - 2 VIEW COMPARISON:  Chest x-ray 07/12/2022, 10/23/2018.  PET-CT 09/20/2021. FINDINGS: Mediastinum and hilar structures normal. Heart size normal. No pulmonary venous congestion. Low lung volumes. Bilateral pleural-parenchymal thickening consistent with scarring again  noted. Mild right base infiltrate cannot be excluded. No pleural effusion or pneumothorax. Degenerative change thoracic spine. No acute or focal bony abnormality is identified. IMPRESSION: Low lung volumes. Bilateral pleural-parenchymal thickening consistent with scarring again noted. Mild right base infiltrate cannot be excluded. Electronically Signed   By: Marcello Moores  Register M.D.   On: 07/24/2022 08:48    PERFORMANCE STATUS (ECOG) : 0 - Asymptomatic  Review of Systems Unless otherwise noted, a complete review of systems is negative.  Physical Exam General: NAD Cardiovascular: regular rate and rhythm Pulmonary: Congested cough, expiratory wheezing Abdomen: soft, nontender, + bowel sounds GU: no suprapubic tenderness Extremities: no edema, no joint deformities Skin: no rashes Neurological: Nonfocal  IMPRESSION/PLAN: URI -recent COVID and symptoms could reflect postviral syndrome.  Negative chest x-ray today.  However, will start empiric coverage with Augmentin x10 days given possible immunosuppression from myeloma treatments.  We will start steroids, Tussionex, and albuterol to hopefully improve symptoms.  Continue symptomatic/supportive care.  Case and plan discussed with Dr. Tasia Catchings.  RTC next week as previously scheduled   Patient expressed understanding and was in agreement with this plan. He also understands that He can call clinic at any time with any questions, concerns, or complaints.   Thank you for allowing me to participate in the care of this very pleasant patient.   Time Total: 15 minutes  Visit consisted of counseling and education dealing with the complex and emotionally intense issues of symptom  management in the setting of serious illness.Greater than 50%  of this time was spent counseling and coordinating care related to the above assessment and plan.  Signed by: Altha Harm, PhD, NP-C

## 2022-08-12 NOTE — Telephone Encounter (Signed)
Patient called asking if he can get medicine for his cough that will not go away. He is asking if he could get his infusion to boost his immune system to see if that would help or be ordered antibiotics. Of note, he was diagnosed with COVID 10/18 and was given Paxlovid. Please advise

## 2022-08-12 NOTE — Progress Notes (Signed)
Pt called in this morning, being seen in [facility] clinic. Pt had covid 2 weeks ago, took oral covid med, still having congestion with green sputum production and shortness of breath.

## 2022-08-13 ENCOUNTER — Other Ambulatory Visit: Payer: Medicare Other

## 2022-08-13 ENCOUNTER — Ambulatory Visit: Payer: Medicare Other | Admitting: Oncology

## 2022-08-13 ENCOUNTER — Ambulatory Visit: Payer: Medicare Other

## 2022-08-17 ENCOUNTER — Other Ambulatory Visit: Payer: Self-pay | Admitting: Family Medicine

## 2022-08-19 ENCOUNTER — Other Ambulatory Visit: Payer: Self-pay | Admitting: Oncology

## 2022-08-19 ENCOUNTER — Telehealth: Payer: Self-pay

## 2022-08-19 NOTE — Telephone Encounter (Signed)
Dr. Tasia Catchings would like patient to complete antibiotic course before treatment.  Patient notified that his appt for tomorrow will be r/s to next week.   Patient reports that he is feeling much better since starting the abx.

## 2022-08-20 ENCOUNTER — Inpatient Hospital Stay: Payer: Medicare Other

## 2022-08-20 ENCOUNTER — Ambulatory Visit: Payer: Medicare Other

## 2022-08-20 ENCOUNTER — Inpatient Hospital Stay: Payer: Medicare Other | Admitting: Oncology

## 2022-08-20 ENCOUNTER — Ambulatory Visit: Payer: Medicare Other | Admitting: Oncology

## 2022-08-20 ENCOUNTER — Other Ambulatory Visit: Payer: Medicare Other

## 2022-08-21 ENCOUNTER — Other Ambulatory Visit: Payer: Self-pay | Admitting: *Deleted

## 2022-08-21 MED ORDER — ACYCLOVIR 400 MG PO TABS: 400.0000 mg | ORAL_TABLET | Freq: Two times a day (BID) | ORAL | 2 refills | Status: DC

## 2022-08-27 ENCOUNTER — Encounter: Payer: Self-pay | Admitting: Oncology

## 2022-08-27 ENCOUNTER — Inpatient Hospital Stay: Payer: Medicare Other | Attending: Oncology

## 2022-08-27 ENCOUNTER — Inpatient Hospital Stay: Payer: Medicare Other

## 2022-08-27 ENCOUNTER — Inpatient Hospital Stay (HOSPITAL_BASED_OUTPATIENT_CLINIC_OR_DEPARTMENT_OTHER): Payer: Medicare Other | Admitting: Oncology

## 2022-08-27 VITALS — BP 127/87 | HR 77 | Temp 98.8°F | Resp 18 | Wt 199.9 lb

## 2022-08-27 DIAGNOSIS — N179 Acute kidney failure, unspecified: Secondary | ICD-10-CM

## 2022-08-27 DIAGNOSIS — J40 Bronchitis, not specified as acute or chronic: Secondary | ICD-10-CM | POA: Insufficient documentation

## 2022-08-27 DIAGNOSIS — C7951 Secondary malignant neoplasm of bone: Secondary | ICD-10-CM

## 2022-08-27 DIAGNOSIS — I129 Hypertensive chronic kidney disease with stage 1 through stage 4 chronic kidney disease, or unspecified chronic kidney disease: Secondary | ICD-10-CM | POA: Insufficient documentation

## 2022-08-27 DIAGNOSIS — D6959 Other secondary thrombocytopenia: Secondary | ICD-10-CM | POA: Insufficient documentation

## 2022-08-27 DIAGNOSIS — T451X5A Adverse effect of antineoplastic and immunosuppressive drugs, initial encounter: Secondary | ICD-10-CM | POA: Insufficient documentation

## 2022-08-27 DIAGNOSIS — N1831 Chronic kidney disease, stage 3a: Secondary | ICD-10-CM | POA: Insufficient documentation

## 2022-08-27 DIAGNOSIS — J209 Acute bronchitis, unspecified: Secondary | ICD-10-CM | POA: Insufficient documentation

## 2022-08-27 DIAGNOSIS — C9000 Multiple myeloma not having achieved remission: Secondary | ICD-10-CM | POA: Insufficient documentation

## 2022-08-27 DIAGNOSIS — D696 Thrombocytopenia, unspecified: Secondary | ICD-10-CM

## 2022-08-27 LAB — COMPREHENSIVE METABOLIC PANEL
ALT: 24 U/L (ref 0–44)
AST: 24 U/L (ref 15–41)
Albumin: 4.5 g/dL (ref 3.5–5.0)
Alkaline Phosphatase: 58 U/L (ref 38–126)
Anion gap: 9 (ref 5–15)
BUN: 18 mg/dL (ref 8–23)
CO2: 24 mmol/L (ref 22–32)
Calcium: 9.7 mg/dL (ref 8.9–10.3)
Chloride: 105 mmol/L (ref 98–111)
Creatinine, Ser: 1.19 mg/dL (ref 0.61–1.24)
GFR, Estimated: >60 mL/min (ref >60)
Glucose, Bld: 120 mg/dL — ABNORMAL HIGH (ref 70–99)
Potassium: 3.7 mmol/L (ref 3.5–5.1)
Sodium: 138 mmol/L (ref 135–145)
Total Bilirubin: 0.9 mg/dL (ref 0.3–1.2)
Total Protein: 7.1 g/dL (ref 6.5–8.1)

## 2022-08-27 LAB — CBC WITH DIFFERENTIAL/PLATELET
Abs Immature Granulocytes: 0.06 10*3/uL (ref 0.00–0.07)
Basophils Absolute: 0 10*3/uL (ref 0.0–0.1)
Basophils Relative: 0 %
Eosinophils Absolute: 0.1 10*3/uL (ref 0.0–0.5)
Eosinophils Relative: 1 %
HCT: 43.3 % (ref 39.0–52.0)
Hemoglobin: 14.5 g/dL (ref 13.0–17.0)
Immature Granulocytes: 1 %
Lymphocytes Relative: 20 %
Lymphs Abs: 2.2 10*3/uL (ref 0.7–4.0)
MCH: 33.9 pg (ref 26.0–34.0)
MCHC: 33.5 g/dL (ref 30.0–36.0)
MCV: 101.2 fL — ABNORMAL HIGH (ref 80.0–100.0)
Monocytes Absolute: 0.7 10*3/uL (ref 0.1–1.0)
Monocytes Relative: 6 %
Neutro Abs: 8.2 10*3/uL — ABNORMAL HIGH (ref 1.7–7.7)
Neutrophils Relative %: 72 %
Platelets: 98 10*3/uL — ABNORMAL LOW (ref 150–400)
RBC: 4.28 MIL/uL (ref 4.22–5.81)
RDW: 14 % (ref 11.5–15.5)
WBC: 11.2 10*3/uL — ABNORMAL HIGH (ref 4.0–10.5)
nRBC: 0 % (ref 0.0–0.2)

## 2022-08-27 LAB — LIPID PANEL
Cholesterol: 202 mg/dL — ABNORMAL HIGH (ref 0–200)
HDL: 49 mg/dL (ref >40)
LDL Cholesterol: 113 mg/dL — ABNORMAL HIGH (ref 0–99)
Total CHOL/HDL Ratio: 4.1 RATIO
Triglycerides: 200 mg/dL — ABNORMAL HIGH (ref <150)
VLDL: 40 mg/dL (ref 0–40)

## 2022-08-27 MED ORDER — DENOSUMAB 120 MG/1.7ML ~~LOC~~ SOLN
120.0000 mg | Freq: Once | SUBCUTANEOUS | Status: AC
  Administered 2022-08-27: 120 mg via SUBCUTANEOUS
  Filled 2022-08-27: qty 1.7

## 2022-08-27 NOTE — Progress Notes (Signed)
Patient here for follow up. Pt reports that he continues to have cough, which has improved.

## 2022-08-27 NOTE — Assessment & Plan Note (Addendum)
Majority of his lucencies were not hypermetabolic on previous PET scan Continue calcium and vitamin D supplementation.  Resume Xgeva today, plan eery 4-6 weeks.

## 2022-08-27 NOTE — Progress Notes (Signed)
Hematology/Oncology Progress note [phone] [phone]      Patient Care Team: Tonia Ghent, MD as PCP - General (Family Medicine) Earlie Server, MD as Consulting Physician (Oncology) Beather Arbour Lilyan Punt, MD as Referring Physician (Hematology and Oncology) Diannia Ruder, MD as Referring Physician (Hematology and Oncology)  REASON FOR VISIT Follow up for multiple myeloma.   ASSESSMENT & PLAN:  Multiple myeloma (HCC) Kappa Light chain multiple myeloma beta 2 microglobulin 5 and normal albumin.  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  06/04/2021 5% plasma cells -August 2022-February 2023, patient completed 7 cycles of daratumumab/Pomalyst/dexamethasone. 11/20/2021, status post bone marrow biopsy which showed 1% of plasma cells.  Likely he has minimal residual disease. Currently on daratumumab maintenance. Labs are reviewed and discussed with patient. light chain ratio stable Hold  daratumumab today to allow full recovery from bronchitis.    Metastasis to bone ([place]) Majority of his lucencies were not hypermetabolic on previous PET scan Continue calcium and vitamin D supplementation.  Resume Xgeva today, plan eery 4-6 weeks.   Thrombocytopenia ([place]) Chemotherapy induced. Stable. Monitor counts.   Stage 3a chronic kidney disease ([place]) avoid nephrotoxin.  Encourage hydration.  Bronchitis Improving.    No orders of the defined types were placed in this encounter.   Follow up in 2 weeks, Lab MD Dara  All questions were answered. The patient knows to call the clinic with any problems, questions or concerns.  Earlie Server, MD, PhD [facility] Hematology Oncology 08/27/2022   HISTORY OF PRESENTING ILLNESS:  Luis Burke is a  66 y.o.  male with PMH listed below presents for follow  up of multiple myeloma.  Oncology History  Multiple myeloma ([place])  08/19/2018 Bone Marrow Biopsy   08/19/2018 bone marrow biopsy showed hypercellular marrow 80%, involved by plasma cell neoplasm up to 95%.  Consistent with plasma cell myeloma. Myeloma FISH  gain of gain of CEP12   08/25/2018 Imaging   PET scan showed no definite hypermetabolic bone disease but CT findings are highly suspicious for numerous small myelomatous lesions involving the spine, sternum and scattered ribs.   08/26/2018 Initial Diagnosis   Multiple myeloma  # 07/27/2018 multiple myeloma panel showed M protein of 0.1, IgG 662, IgA 49, IgM 7. 08/10/2018, free light chain ratio showed extremely high level of kappa free light chain 10,183, with a kappa lambda light chain ratio of 1414.31,LDH 164, Beta-2 microglobulin 5     08/28/2018 - 03/17/2019 Chemotherapy   RVD x 9     04/23/2019 Bone Marrow Transplant   autologous stem cell bone marrow transplant at [facility].  He received preparative regimen with melphalan 200 mg/m on 04/22/2019 followed by autologous stem cell infusion on 04/23/2019. Transplant course was complicated with febrile neutropenia grade 3, treated with vancomycin and cephapirin until engraftment on 05/06/2019. Patient also had engraftment syndrome and had to be started on Solu-Medrol 25 mg twice daily on 05/05/2019.  Transition to Medrol Dosepak on 05/07/2019 to complete steroid taper as outpatient.  re-vaccination 12 months post transplant   08/10/2019 - 05/2022 Chemotherapy    Ixazomib 102m on D1/D8/D15 out of 28 day cycle       07/12/2020 Imaging   [facility].  Lucent lesion in the L4 spinous process measuring 4 mm which is  nonspecific. Consider confirmation of marrow replacing  process with MRI.  2.  Incompletely characterized renal cysts    07/28/2020 Bone Marrow Biopsy    bone marrow normocellular marrow with polytypic plasmacytosis, 3 to 8%   Patient is in remission.   Cytogenetics negative for trisomy 12.  Negative myeloma FISH panel.   08/05/2020 Imaging   MRI lumbar spine with and without contrast was done at [facility] showed no evidence of spinal metastasis.  Lumbar spondylosis is most pronounced at L5-S1 where there is severe right and moderate left neural foraminal stenosis. 08/11/2020, x-ray cervical spine complete with flexion and extension showed The cervical spine is visualized from C1 to the top of T1 on the lateral  view.  Reversal of the normal cervical lordosis with a focal kyphosis centered at the C5-C6 intervertebral level. Trace anterolisthesis of C4 onto C5. Trace retrolisthesis of C6 on C7. No evidence of dynamic instability is noted on  the flexion or extension views.   No prevertebral soft tissue swelling. Cervical vertebral body heights are maintained. Multilevel degenerative changes of the cervical spine including discogenic disease, most pronounced at C5-C6 and C6-C7, and facet arthropathy most pronounced at C4-C5. Multilevel mild to moderate left neural foraminal osseous narrowing of the mid to lower cervical vertebrae, possibly centrally/artifactual by patient positioning.     05/15/2021 Bone Marrow Biopsy   Bone marrow biopsy showed slightly hypercellular bone marrow for age with slight plasmacytosis.  5% plasma cells with kappa light chain restriction.  Cytogenetics normal.  Myeloma FISH negative I discussed with [place]. we both feel that his bone marrow biopsy result reflects early relapse. Recommend stop Ixazomib. Switch to second line treatment with Daratumumab Pomlyst Dexamethasone   05/31/2021 - 05/20/2022 Chemotherapy   Patient is on Treatment Plan : MYELOMA Daratumumab + Pomalidomide + Dexamethasone q28d x 7 cycles     05/31/2021 -  Chemotherapy   Patient is on Treatment Plan : MYELOMA Daratumumab IV + Pomalidomide + Dexamethasone q28d x 7 cycles     09/21/2021 Imaging    PET scan showed no evidence of suspicious or new  hypermetabolism, scattered small lucent foci seen previously in the sternum and the spine are less prominent today and have resolved in some regions.  Aortic atherosclerosis.   11/18/2021 Imaging   MRI cervical spine with and without contrast showed no multiple myeloma lesion or marrow edema identified in the cervical spine.  Abnormal dural thickening and a/or abnormal epidural space at C4/C5 levels.  This is nonspecific.  ER physician discussed the case with neurosurgery Dr. Cari Caraway who feels that this is more likely degenerative disc disease.   11/20/2021 Bone Marrow Biopsy   bone marrow biopsy which showed 1% of plasma cells. The plasma cells generally display polyclonal staining pattern for kappa and lambda light chains although a few very small clusters appear kappa light chain restricted.  The findings are very limited but most suggestive of minimal residual plasma cell neoplasm especially in the presence of monoclonal protein with kappa light chain specificity. Normal cytogenetics and myeloma FISH   12/20/2021 -  Chemotherapy   Daratumumab monthly maintenance  # I discussed with patient's [facility] oncologist Dr. Maylene Roes.  We have agreed upon de-escalating his regimen to Daratumumab monthly maintenance for now. Discontinue pomalyst.     Metastasis to bone ([place])   #01/14/2022, received IVIG 400 mg/kg x1   INTERVAL HISTORY MAYCOL HOYING is a 66 y.o. male who has above history reviewed by me today for follow-up multiple myeloma.  Denies any nausea vomiting  diarrhea today Since September 2023, patient has had recurrent upper respiratory infection/bronchitis Multiple x-rays showed no acute process.  Patient was treated with multiple courses of empiric antibiotics.  Most recently Augmentin.  He finally feels cough has improved.  Occasionally he coughs up light greenish sputum.  No fever or chills.  Otherwise he feels well. Patient has had preventive examinations including influenza, COVID19 and  RSV Review of Systems  Constitutional:  Negative for appetite change, chills, fatigue, fever and unexpected weight change.  HENT:   Negative for hearing loss.   Eyes:  Negative for eye problems and icterus.  Respiratory:  Positive for cough. Negative for chest tightness and shortness of breath.   Cardiovascular:  Negative for chest pain and leg swelling.  Gastrointestinal:  Negative for abdominal distention and abdominal pain.  Endocrine: Negative for hot flashes.  Genitourinary:  Negative for difficulty urinating, dysuria and frequency.   Musculoskeletal:  Positive for back pain. Negative for arthralgias.  Skin:  Negative for itching and rash.  Neurological:  Negative for light-headedness and numbness.  Hematological:  Negative for adenopathy. Does not bruise/bleed easily.  Psychiatric/Behavioral:  Negative for confusion.     MEDICAL HISTORY:  Past Medical History:  Diagnosis Date   AKI (acute kidney injury) ([place]) 01/06/2019   Anemia    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[place])    autologous stem cell bone marrow transplant on 04/23/2019.   Fatty liver    on u/s 07/2018   Foot fracture    Hyperlipidemia 03/2004   Hypertension 1994   Multiple myeloma ([place])    Snores     SURGICAL HISTORY: Past Surgical History:  Procedure Laterality Date   CATARACT EXTRACTION Left 10/10/2021   CATARACT EXTRACTION W/PHACO Right 03/04/2018   Procedure: CATARACT EXTRACTION PHACO AND INTRAOCULAR LENS PLACEMENT ([place])  RIGHT TORIC;  Surgeon: Leandrew Koyanagi, MD;  Location: [place];  Service: Ophthalmology;  Laterality: Right;  [place] no Toric Lens 1:45 5.3   CATARACT EXTRACTION W/PHACO Left 10/10/2021   Procedure: CATARACT EXTRACTION PHACO AND INTRAOCULAR LENS PLACEMENT ([place]) LEFT;  Surgeon: Leandrew Koyanagi, MD;  Location: [place];  Service: Ophthalmology;  Laterality: Left;  5.16 1:03.8   COLONOSCOPY WITH PROPOFOL N/A 07/20/2018   Procedure: COLONOSCOPY WITH PROPOFOL;   Surgeon: Jonathon Bellows, MD;  Location: [facility] ENDOSCOPY;  Service: Gastroenterology;  Laterality: N/A;   ESOPHAGOGASTRODUODENOSCOPY (EGD) WITH PROPOFOL N/A 07/20/2018   Procedure: ESOPHAGOGASTRODUODENOSCOPY (EGD) WITH PROPOFOL;  Surgeon: Jonathon Bellows, MD;  Location: [facility] ENDOSCOPY;  Service: Gastroenterology;  Laterality: N/A;   EYE SURGERY Right    HERNIA REPAIR     L Lap herniorraphy  04/2000   Left wrist ganglionectomy      SOCIAL HISTORY: Social History   Socioeconomic History   Marital status: Married    Spouse name: Not on file   Number of children: 1   Years of education: Not on file   Highest education level: Not on file  Occupational History   Occupation: [employer]  Tobacco Use   Smoking status: Never   Smokeless tobacco: Never   Tobacco comments:    Occassionally  Vaping Use   Vaping Use: Never used  Substance and Sexual Activity   Alcohol use: Not Currently    Alcohol/week: 3.0 standard drinks of alcohol    Types: 3 Cans of beer per week   Drug use: No   Sexual activity: Not on file  Other Topics Concern   Not on file  Social History Narrative  Divorced 2009, married 05/27/21.     His daughter died 4 days after giving birth to the patient's granddaughter   Has joint custody of his deceased daughter's child   Worked at [employer] is [place], retired 2020.     Social Determinants of Health   Financial Resource Strain: Not on file  Food Insecurity: Not on file  Transportation Needs: Not on file  Physical Activity: Not on file  Stress: Not on file  Social Connections: Not on file  Intimate Partner Violence: Not on file    FAMILY HISTORY: Family History  Problem Relation Age of Onset   Hypertension Mother    Diabetes Mother    Heart disease Mother        CAD   Dementia Mother    Prostate cancer Father    Hypertension Father    Heart disease Father        MI 02/03   Colon cancer Father    Hypertension Sister    Hypertension Sister     Hypertension Sister     ALLERGIES:  is allergic to bactrim [sulfamethoxazole-trimethoprim], clonidine derivatives, and tadalafil.  MEDICATIONS:  Current Outpatient Medications  Medication Sig Dispense Refill   acetaminophen (TYLENOL) 325 MG tablet Take 2 tablets (650 mg total) by mouth See admin instructions. 1 hour prior to chemotherapy treatments 30 tablet 0   acyclovir (ZOVIRAX) 400 MG tablet Take 1 tablet (400 mg total) by mouth 2 (two) times daily. 60 tablet 2   amLODipine (NORVASC) 5 MG tablet Take 2 tablets (10 mg total) by mouth daily. 180 tablet 3   ASPIRIN 81 PO Take 81 mg by mouth daily.     B Complex Vitamins (VITAMIN B COMPLEX PO) Take 1 Dose by mouth daily.     chlorpheniramine-HYDROcodone (TUSSIONEX) 10-8 MG/5ML Take 5 mLs by mouth at bedtime as needed for cough. 70 mL 0   co-enzyme Q-10 50 MG capsule Take 50 mg by mouth daily.     [employer] VITAMIN B12 1000 MCG tablet TAKE 1 TABLET BY MOUTH EVERY DAY 90 tablet 3   Denosumab (XGEVA [place]) Inject into the skin every 30 (thirty) days. Last rcvd 09/27/21     dexamethasone (DECADRON) 4 MG tablet Take 5 tablets (20 mg total) by mouth once a week. Take at least 1 hour prior to infusion appt. Take with food. To start on 06/07/21. 60 tablet 0   diphenhydrAMINE (BENADRYL) 50 MG tablet Take 1 tablet (50 mg total) by mouth See admin instructions. Take 1 tablet 1 hour prior to chemotherapy treatments. 30 tablet 0   hydrALAZINE (APRESOLINE) 10 MG tablet TAKE 1 TABLET(10 MG) BY MOUTH THREE TIMES DAILY 270 tablet 3   lisinopril (ZESTRIL) 20 MG tablet Take 1 tablet (20 mg total) by mouth daily. 90 tablet 3   magnesium chloride (SLOW-MAG) 64 MG TBEC SR tablet Take 1 tablet (64 mg total) by mouth daily. 60 tablet 0   metoprolol succinate (TOPROL-XL) 50 MG 24 hr tablet TAKE 4 TABLETS BY MOUTH EVERY DAY WITH OR IMMEDIATELY FOLLOWING A MEAL 360 tablet 3   montelukast (SINGULAIR) 10 MG tablet Take 1 tablet (10 mg total) by mouth See admin instructions. Take  1day prior to chemotherapy 30 tablet 1   ondansetron (ZOFRAN) 8 MG tablet Take 1 tablet (8 mg total) by mouth every 8 (eight) hours as needed. for nausea 30 tablet 1   pravastatin (PRAVACHOL) 10 MG tablet Take 1 tablet (10 mg total) by mouth daily. 90 tablet 3  predniSONE (DELTASONE) 20 MG tablet Take 1 tablet (20 mg) daily x1 week, then take half tablet (10 mg) daily x1 week, then stop 14 tablet 0   prochlorperazine (COMPAZINE) 10 MG tablet      sildenafil (REVATIO) 20 MG tablet Take 1-5 tablets (20-100 mg total) by mouth daily as needed. 50 tablet 12   zinc gluconate 50 MG tablet Take 50 mg by mouth daily.     No current facility-administered medications for this visit.   Facility-Administered Medications Ordered in Other Visits  Medication Dose Route Frequency Provider Last Rate Last Admin   0.9 %  sodium chloride infusion   Intravenous Once Earlie Server, MD         PHYSICAL EXAMINATION: ECOG PERFORMANCE STATUS: 1 - Symptomatic but completely ambulatory Vitals:   08/27/22 0932  BP: 127/87  Pulse: 77  Resp: 18  Temp: 98.8 F (37.1 C)   Filed Weights   08/27/22 0932  Weight: 199 lb 14.4 oz (90.7 kg)    Physical Exam Constitutional:      General: He is not in acute distress. HENT:     Head: Normocephalic and atraumatic.  Eyes:     General: No scleral icterus. Cardiovascular:     Rate and Rhythm: Normal rate and regular rhythm.     Heart sounds: Normal heart sounds.  Pulmonary:     Effort: Pulmonary effort is normal. No respiratory distress.     Breath sounds: No wheezing.  Abdominal:     General: Bowel sounds are normal. There is no distension.     Palpations: Abdomen is soft.  Musculoskeletal:        General: No deformity. Normal range of motion.     Cervical back: Normal range of motion and neck supple.  Skin:    General: Skin is warm and dry.     Findings: No erythema or rash.  Neurological:     Mental Status: He is alert and oriented to person, place, and time.  Mental status is at baseline.     Cranial Nerves: No cranial nerve deficit.     Coordination: Coordination normal.  Psychiatric:        Mood and Affect: Mood normal.      LABORATORY DATA:  I have reviewed the data as listed Lab Results  Component Value Date   WBC 11.2 (H) 08/27/2022   HGB 14.5 08/27/2022   HCT 43.3 08/27/2022   MCV 101.2 (H) 08/27/2022   PLT 98 (L) 08/27/2022   Recent Labs    06/18/22 0846 07/23/22 0838 07/30/22 1540 08/27/22 0858  NA 136 137 136 138  K 3.8 3.9 4.2 3.7  CL 102 105 105 105  CO2 _0 GLUCOSE 120* 107* 108* 120*  BUN _1 CREATININE 1.49* 1.44* 1.40* 1.19  CALCIUM 9.8 9.1 9.8 9.7  GFRNONAA 51* 54* 55* >60  PROT 7.9 7.0  --  7.1  ALBUMIN 4.7 4.5  --  4.5  AST 26 21  --  24  ALT 26 14  --  24  ALKPHOS 57 64  --  58  BILITOT 0.6 0.9  --  0.9    Iron/TIBC/Ferritin/ %Sat    Component Value Date/Time   IRON 74 07/01/2018 0944   TIBC 250 07/01/2018 0944   FERRITIN 357 07/01/2018 0944   IRONPCTSAT 30 07/01/2018 0944    Lab Results  Component Value Date   TOTALPROTELP 6.4 07/23/2022   ALBUMINELP 4.0 03/08/2021  A1GS 0.2 03/08/2021   A2GS 0.7 03/08/2021   BETS 1.1 03/08/2021   GAMS 1.1 03/08/2021   MSPIKE Not Observed 03/08/2021   SPEI Comment 03/08/2021   Lab Results  Component Value Date   KPAFRELGTCHN 11.1 07/23/2022   LAMBDASER 6.0 07/23/2022   KAPLAMBRATIO 1.85 (H) 07/23/2022    RADIOGRAPHIC STUDIES: I have personally reviewed the radiological images as listed and agreed with the findings in the report. DG Chest 2 View  Result Date: 08/12/2022 CLINICAL DATA:  Productive cough and shortness of breath for 1 month. EXAM: CHEST - 2 VIEW COMPARISON:  Chest radiograph 07/30/2022 FINDINGS: The cardiomediastinal silhouette is normal. There is no focal consolidation or pulmonary edema. There is no pleural effusion or pneumothorax There is no acute osseous abnormality. IMPRESSION: Stable chest with no  radiographic evidence of acute cardiopulmonary process. Electronically Signed   By: Valetta Mole M.D.   On: 08/12/2022 13:54   DG Chest 2 View  Result Date: 07/30/2022 CLINICAL DATA:  cough, SOB, h/o multiple myeloma EXAM: CHEST - 2 VIEW COMPARISON:  Radiograph 07/23/2022 FINDINGS: The heart size and mediastinal contours are within normal limits.No focal airspace disease. No pleural effusion or pneumothorax.Thoracic spondylosis. No acute osseous abnormality. IMPRESSION: No evidence of acute cardiopulmonary disease. Electronically Signed   By: Maurine Simmering M.D.   On: 07/30/2022 16:28   DG Chest 2 View  Result Date: 07/24/2022 CLINICAL DATA:  History of cough and congestion. History of hypertension and multiple myeloma. EXAM: CHEST - 2 VIEW COMPARISON:  Chest x-ray 07/12/2022, 10/23/2018.  PET-CT 09/20/2021. FINDINGS: Mediastinum and hilar structures normal. Heart size normal. No pulmonary venous congestion. Low lung volumes. Bilateral pleural-parenchymal thickening consistent with scarring again noted. Mild right base infiltrate cannot be excluded. No pleural effusion or pneumothorax. Degenerative change thoracic spine. No acute or focal bony abnormality is identified. IMPRESSION: Low lung volumes. Bilateral pleural-parenchymal thickening consistent with scarring again noted. Mild right base infiltrate cannot be excluded. Electronically Signed   By: Marcello Moores  Register M.D.   On: 07/24/2022 08:48   DG Chest 2 View  Result Date: 07/12/2022 CLINICAL DATA:  Cough with blood in mucus.  Bronchitis. EXAM: CHEST - 2 VIEW COMPARISON:  PET-09/20/2021.  Chest radiographs 10/23/2018. FINDINGS: The cardiomediastinal silhouette is unchanged with normal heart size. Mild scarring or atelectasis is noted in the left lung base. The right lung is clear. No pleural effusion or pneumothorax is identified. No acute osseous abnormality is seen. IMPRESSION: Mild left basilar scarring or atelectasis. Electronically Signed   By:  Logan Bores M.D.   On: 07/12/2022 11:31   CT MAXILLOFACIAL WO CONTRAST  Result Date: 06/14/2022 CLINICAL DATA:  Left jaw pain. EXAM: CT MAXILLOFACIAL WITHOUT CONTRAST TECHNIQUE: Multidetector CT imaging of the maxillofacial structures was performed. Multiplanar CT image reconstructions were also generated. RADIATION DOSE REDUCTION: This exam was performed according to the departmental dose-optimization program which includes automated exposure control, adjustment of the mA and/or kV according to patient size and/or use of iterative reconstruction technique. COMPARISON:  None Available. FINDINGS: Osseous: No fracture or mandibular dislocation. No destructive process. Orbits: Negative. No traumatic or inflammatory finding. Sinuses: Clear. Soft tissues: Negative. Limited intracranial: No significant or unexpected finding. IMPRESSION: No definite abnormality seen in the maxillofacial region. Electronically Signed   By: Marijo Conception M.D.   On: 06/14/2022 14:01

## 2022-08-27 NOTE — Assessment & Plan Note (Signed)
Chemotherapy induced. Stable. Monitor counts.

## 2022-08-27 NOTE — Assessment & Plan Note (Addendum)
Kappa Light chain multiple myeloma beta 2 microglobulin 5 and normal albumin.  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  06/04/2021 5% plasma cells -August 2022-February 2023, patient completed 7 cycles of daratumumab/Pomalyst/dexamethasone. 11/20/2021, status post bone marrow biopsy which showed 1% of plasma cells.  Likely he has minimal residual disease. Currently on daratumumab maintenance. Labs are reviewed and discussed with patient. light chain ratio stable Hold  daratumumab today to allow full recovery from bronchitis.

## 2022-08-27 NOTE — Assessment & Plan Note (Signed)
Improving.

## 2022-08-27 NOTE — Assessment & Plan Note (Signed)
avoid nephrotoxin.  Encourage hydration.

## 2022-08-28 LAB — KAPPA/LAMBDA LIGHT CHAINS
Kappa free light chain: 13.5 mg/L (ref 3.3–19.4)
Kappa, lambda light chain ratio: 3.21 — ABNORMAL HIGH (ref 0.26–1.65)
Lambda free light chains: 4.2 mg/L — ABNORMAL LOW (ref 5.7–26.3)

## 2022-08-30 LAB — MULTIPLE MYELOMA PANEL, SERUM
Albumin SerPl Elph-Mcnc: 4.3 g/dL (ref 2.9–4.4)
Albumin/Glob SerPl: 1.9 — ABNORMAL HIGH (ref 0.7–1.7)
Alpha 1: 0.2 g/dL (ref 0.0–0.4)
Alpha2 Glob SerPl Elph-Mcnc: 0.7 g/dL (ref 0.4–1.0)
B-Globulin SerPl Elph-Mcnc: 0.9 g/dL (ref 0.7–1.3)
Gamma Glob SerPl Elph-Mcnc: 0.5 g/dL (ref 0.4–1.8)
Globulin, Total: 2.3 g/dL (ref 2.2–3.9)
IgA: 98 mg/dL (ref 61–437)
IgG (Immunoglobin G), Serum: 585 mg/dL — ABNORMAL LOW (ref 603–1613)
IgM (Immunoglobulin M), Srm: 13 mg/dL — ABNORMAL LOW (ref 20–172)
Total Protein ELP: 6.6 g/dL (ref 6.0–8.5)

## 2022-08-31 ENCOUNTER — Other Ambulatory Visit: Payer: Self-pay | Admitting: Family Medicine

## 2022-09-03 ENCOUNTER — Inpatient Hospital Stay (HOSPITAL_BASED_OUTPATIENT_CLINIC_OR_DEPARTMENT_OTHER): Payer: Medicare Other | Admitting: Hospice and Palliative Medicine

## 2022-09-03 ENCOUNTER — Telehealth: Payer: Self-pay

## 2022-09-03 ENCOUNTER — Other Ambulatory Visit (HOSPITAL_COMMUNITY): Payer: Self-pay

## 2022-09-03 ENCOUNTER — Telehealth: Payer: Self-pay | Admitting: *Deleted

## 2022-09-03 ENCOUNTER — Telehealth: Payer: Self-pay | Admitting: Family Medicine

## 2022-09-03 DIAGNOSIS — N183 Chronic kidney disease, stage 3 unspecified: Secondary | ICD-10-CM

## 2022-09-03 DIAGNOSIS — J069 Acute upper respiratory infection, unspecified: Secondary | ICD-10-CM

## 2022-09-03 DIAGNOSIS — J329 Chronic sinusitis, unspecified: Secondary | ICD-10-CM | POA: Diagnosis not present

## 2022-09-03 DIAGNOSIS — R053 Chronic cough: Secondary | ICD-10-CM

## 2022-09-03 DIAGNOSIS — C9000 Multiple myeloma not having achieved remission: Secondary | ICD-10-CM | POA: Diagnosis not present

## 2022-09-03 NOTE — Telephone Encounter (Signed)
Patient called reporting that he is still coughing and has congestion. He is asking if he needs more antibiotics Please advise

## 2022-09-03 NOTE — Telephone Encounter (Signed)
Rn spoke with Merrily Pew, NP. recommendation for chest xray and virtual visit. I called patient to discuss plan of care. Virtual visit offered for patient. Pt declines cxr.

## 2022-09-03 NOTE — Telephone Encounter (Signed)
  Received a fax from [employer] Part D  regarding Prior Authorization for SILDENAFIL '20MG'$ .   Authorization has been DENIED due to Plan Exclusion.  Patient can use discount card at [facility] to get it for 20.95  Discount card information:  BIN [id number] PCN [employer] [id number] Member ID [id number]

## 2022-09-03 NOTE — Telephone Encounter (Signed)
Pt called stating his oncologist suggested that the pt should get the rsv shot. Pt is asking would this be suggested by Damita Dunnings as well since he has cancer & undergoing chemo along with currently having a cold? Pt is requesting a call back from [place] with advice. Call back # [phone]

## 2022-09-03 NOTE — Telephone Encounter (Signed)
Pharmacy Patient Advocate Encounter   Received notification from [facility] that prior authorization for Sildenafil '20mg'$  is required/requested.  PA submitted on 09/03/22 to [employer] Part D via CoverMyMeds  Key [id number] PA Case ID: [id number]  Status is pending

## 2022-09-03 NOTE — Progress Notes (Signed)
Virtual Visit via Telephone Note  I connected with [name] on 09/03/22 at  1:45 PM EST by telemedicine application and verified that I am speaking with the correct person using two identifiers.  Location: Patient: Home Provider: Clinic   I discussed the limitations of evaluation and management by telemedicine and the availability of in person appointments. The patient expressed understanding and agreed to proceed.  History of Present Illness: SCOUT GUMBS is a 66 y.o. male with multiple medical problems including CKD stage III, multiple myeloma.  He is status post stem cell transplant with early relapse.  patient completed 7 cycles of daratumumab/Pomalyst/dexamethasone. He is on treatment with daratumumab maintenance and Xgeva.   Observations/Objective: Patient requested virtual visit today to discuss recurrent sinus drainage/cough.  He has had similar symptoms recurring with regular frequency over the past 2 to 3 months.  Patient has been on multiple previous courses of antibiotics and says that those help but did not relieve his symptoms entirely.  He feels like his sinus drainage/postnasal drip worsens shortly after antibiotics are completed.  Patient denies fever or chills.  He does have a cough but says that that is from his postnasal drip.  He denies shortness of breath or wheezing.  Assessment and Plan: Chronic sinusitis -discussed with Dr. Tasia Catchings and will refer patient to ENT as well as obtaining noncontrast CT of the sinuses.  We will hold on further antibiotics at this point until patient is evaluated by ENT as he may require sinus culture.  Follow Up Instructions: As previously scheduled.  Patient can be seen sooner if needed   I discussed the assessment and treatment plan with the patient. The patient was provided an opportunity to ask questions and all were answered. The patient agreed with the plan and demonstrated an understanding of the instructions.   The patient was  advised to call back or seek an in-person evaluation if the symptoms worsen or if the condition fails to improve as anticipated.  I provided 10 minutes of non-face-to-face time during this encounter.   Irean Hong, NP

## 2022-09-03 NOTE — Telephone Encounter (Signed)
Mr. Martis notified by telephone that PA for sildenafil was denied.  Patient will pay out of pocket for.  Nothing further needed at this time.

## 2022-09-03 NOTE — Telephone Encounter (Signed)
Noted  

## 2022-09-04 NOTE — Telephone Encounter (Signed)
Patient notified as instructed by telephone and verbalized understanding. Patient stated that his cancer doctor has referred him to Dr. Tami Ribas ENT since he is still having symptoms.  Patient stated that they are planning on doing a CT scan. Patient stated he appreciated Dr. Josefine Class input.

## 2022-09-04 NOTE — Telephone Encounter (Signed)
Noted. Thanks.

## 2022-09-04 NOTE — Telephone Encounter (Signed)
If oncology recommended then I agree.  I think it makes sense to get the shot when he is over his cold and back to baseline.  Thanks.

## 2022-09-10 ENCOUNTER — Telehealth: Payer: Self-pay | Admitting: *Deleted

## 2022-09-10 ENCOUNTER — Ambulatory Visit
Admission: RE | Admit: 2022-09-10 | Discharge: 2022-09-10 | Disposition: A | Discharge status: Home / Self Care | Payer: Medicare Other | Source: Ambulatory Visit | Attending: Hospice and Palliative Medicine | Admitting: Hospice and Palliative Medicine

## 2022-09-10 DIAGNOSIS — J329 Chronic sinusitis, unspecified: Secondary | ICD-10-CM | POA: Insufficient documentation

## 2022-09-10 NOTE — Telephone Encounter (Signed)
[facility] called reporting that the date turned in for Luis Burke needed to be 07/12/22 not todays date. Please fax a new referral with correct date to them [phone]

## 2022-09-10 NOTE — Telephone Encounter (Signed)
Referral letter re-faxed with correct date.

## 2022-09-11 ENCOUNTER — Telehealth: Payer: Self-pay | Admitting: Hospice and Palliative Medicine

## 2022-09-11 NOTE — Telephone Encounter (Signed)
Discussed CT findings with patient via phone.  He has appointment with Dr. Tami Ribas with ENT this afternoon.

## 2022-09-17 ENCOUNTER — Ambulatory Visit: Payer: Medicare Other | Admitting: Oncology

## 2022-09-17 ENCOUNTER — Other Ambulatory Visit: Payer: Medicare Other

## 2022-09-17 ENCOUNTER — Ambulatory Visit: Payer: Medicare Other

## 2022-09-19 ENCOUNTER — Other Ambulatory Visit: Payer: Self-pay | Admitting: Oncology

## 2022-09-19 ENCOUNTER — Telehealth: Payer: Self-pay

## 2022-09-19 DIAGNOSIS — C9000 Multiple myeloma not having achieved remission: Secondary | ICD-10-CM

## 2022-09-19 NOTE — Telephone Encounter (Addendum)
Pt informed of MD recommendation. Informed him scheduling will contact him once appts are r/s.

## 2022-09-19 NOTE — Telephone Encounter (Signed)
Patient called stating he seen on mychart he was not scheduled for Chemo for the month of December and wanted to see if this was a mistake or was it pushed out to January because he wasn't feeling good. Patient wanted to let us all know he is feeing a lot better. Patient states ENT put him on a antibiotic and prednisone which he states he will be finished with the prednisone on Saturday and has 10-15 days left on the antibiotic. He is fine with Appointments in January but he just wanted clarification.

## 2022-09-19 NOTE — Telephone Encounter (Signed)
Per last visit LOS on 11/14: lab/MD/ [facility] in 2 weeks.

## 2022-09-20 ENCOUNTER — Other Ambulatory Visit: Payer: Self-pay

## 2022-09-22 ENCOUNTER — Other Ambulatory Visit: Payer: Self-pay

## 2022-09-24 ENCOUNTER — Other Ambulatory Visit: Payer: Medicare Other

## 2022-09-24 ENCOUNTER — Ambulatory Visit: Payer: Medicare Other | Admitting: Oncology

## 2022-09-24 ENCOUNTER — Ambulatory Visit: Payer: Medicare Other

## 2022-09-26 ENCOUNTER — Other Ambulatory Visit: Payer: Self-pay

## 2022-10-09 ENCOUNTER — Encounter: Payer: Self-pay | Admitting: Oncology

## 2022-10-09 ENCOUNTER — Inpatient Hospital Stay (HOSPITAL_BASED_OUTPATIENT_CLINIC_OR_DEPARTMENT_OTHER): Payer: Medicare Other | Admitting: Oncology

## 2022-10-09 ENCOUNTER — Inpatient Hospital Stay: Payer: Medicare Other

## 2022-10-09 ENCOUNTER — Inpatient Hospital Stay: Payer: Medicare Other | Attending: Oncology

## 2022-10-09 VITALS — BP 145/90 | HR 71 | Temp 98.7°F | Resp 18 | Wt 205.9 lb

## 2022-10-09 DIAGNOSIS — Z5111 Encounter for antineoplastic chemotherapy: Secondary | ICD-10-CM

## 2022-10-09 DIAGNOSIS — Z9481 Bone marrow transplant status: Secondary | ICD-10-CM | POA: Diagnosis not present

## 2022-10-09 DIAGNOSIS — Z5112 Encounter for antineoplastic immunotherapy: Secondary | ICD-10-CM | POA: Diagnosis present

## 2022-10-09 DIAGNOSIS — N1831 Chronic kidney disease, stage 3a: Secondary | ICD-10-CM

## 2022-10-09 DIAGNOSIS — N179 Acute kidney failure, unspecified: Secondary | ICD-10-CM

## 2022-10-09 DIAGNOSIS — C7951 Secondary malignant neoplasm of bone: Secondary | ICD-10-CM | POA: Diagnosis not present

## 2022-10-09 DIAGNOSIS — D696 Thrombocytopenia, unspecified: Secondary | ICD-10-CM

## 2022-10-09 DIAGNOSIS — C9000 Multiple myeloma not having achieved remission: Secondary | ICD-10-CM

## 2022-10-09 DIAGNOSIS — Z800 Family history of malignant neoplasm of digestive organs: Secondary | ICD-10-CM | POA: Diagnosis not present

## 2022-10-09 DIAGNOSIS — Z8042 Family history of malignant neoplasm of prostate: Secondary | ICD-10-CM | POA: Diagnosis not present

## 2022-10-09 DIAGNOSIS — R771 Abnormality of globulin: Secondary | ICD-10-CM | POA: Diagnosis not present

## 2022-10-09 DIAGNOSIS — I129 Hypertensive chronic kidney disease with stage 1 through stage 4 chronic kidney disease, or unspecified chronic kidney disease: Secondary | ICD-10-CM | POA: Diagnosis not present

## 2022-10-09 LAB — COMPREHENSIVE METABOLIC PANEL
ALT: 20 U/L (ref 0–44)
AST: 21 U/L (ref 15–41)
Albumin: 4.3 g/dL (ref 3.5–5.0)
Alkaline Phosphatase: 50 U/L (ref 38–126)
Anion gap: 8 (ref 5–15)
BUN: 14 mg/dL (ref 8–23)
CO2: 25 mmol/L (ref 22–32)
Calcium: 8.8 mg/dL — ABNORMAL LOW (ref 8.9–10.3)
Chloride: 108 mmol/L (ref 98–111)
Creatinine, Ser: 1.19 mg/dL (ref 0.61–1.24)
GFR, Estimated: >60 mL/min (ref >60)
Glucose, Bld: 103 mg/dL — ABNORMAL HIGH (ref 70–99)
Potassium: 3.8 mmol/L (ref 3.5–5.1)
Sodium: 141 mmol/L (ref 135–145)
Total Bilirubin: 0.6 mg/dL (ref 0.3–1.2)
Total Protein: 7 g/dL (ref 6.5–8.1)

## 2022-10-09 LAB — CBC WITH DIFFERENTIAL/PLATELET
Abs Immature Granulocytes: 0.02 10*3/uL (ref 0.00–0.07)
Basophils Absolute: 0 10*3/uL (ref 0.0–0.1)
Basophils Relative: 0 %
Eosinophils Absolute: 0.1 10*3/uL (ref 0.0–0.5)
Eosinophils Relative: 2 %
HCT: 40.4 % (ref 39.0–52.0)
Hemoglobin: 13.8 g/dL (ref 13.0–17.0)
Immature Granulocytes: 1 %
Lymphocytes Relative: 43 %
Lymphs Abs: 1.6 10*3/uL (ref 0.7–4.0)
MCH: 34.2 pg — ABNORMAL HIGH (ref 26.0–34.0)
MCHC: 34.2 g/dL (ref 30.0–36.0)
MCV: 100 fL (ref 80.0–100.0)
Monocytes Absolute: 0.3 10*3/uL (ref 0.1–1.0)
Monocytes Relative: 8 %
Neutro Abs: 1.8 10*3/uL (ref 1.7–7.7)
Neutrophils Relative %: 46 %
Platelets: 80 10*3/uL — ABNORMAL LOW (ref 150–400)
RBC: 4.04 MIL/uL — ABNORMAL LOW (ref 4.22–5.81)
RDW: 13.7 % (ref 11.5–15.5)
WBC: 3.8 10*3/uL — ABNORMAL LOW (ref 4.0–10.5)
nRBC: 0 % (ref 0.0–0.2)

## 2022-10-09 MED ORDER — DIPHENHYDRAMINE HCL 25 MG PO CAPS
50.0000 mg | ORAL_CAPSULE | Freq: Once | ORAL | Status: AC
  Administered 2022-10-09: 50 mg via ORAL
  Filled 2022-10-09: qty 2

## 2022-10-09 MED ORDER — DARATUMUMAB-HYALURONIDASE-FIHJ 1800-30000 MG-UT/15ML ~~LOC~~ SOLN
1800.0000 mg | Freq: Once | SUBCUTANEOUS | Status: AC
  Administered 2022-10-09: 1800 mg via SUBCUTANEOUS
  Filled 2022-10-09: qty 15

## 2022-10-09 MED ORDER — DEXAMETHASONE 4 MG PO TABS
20.0000 mg | ORAL_TABLET | Freq: Once | ORAL | Status: AC
  Administered 2022-10-09: 20 mg via ORAL
  Filled 2022-10-09: qty 5

## 2022-10-09 MED ORDER — DENOSUMAB 120 MG/1.7ML ~~LOC~~ SOLN
120.0000 mg | Freq: Once | SUBCUTANEOUS | Status: AC
  Administered 2022-10-09: 120 mg via SUBCUTANEOUS
  Filled 2022-10-09: qty 1.7

## 2022-10-09 NOTE — Progress Notes (Signed)
Hematology/Oncology Progress note [phone] [phone]      Patient Care Team: Tonia Ghent, MD as PCP - General (Family Medicine) Earlie Server, MD as Consulting Physician (Oncology) Beather Arbour Lilyan Punt, MD as Referring Physician (Hematology and Oncology) Diannia Ruder, MD as Referring Physician (Hematology and Oncology)  REASON FOR VISIT Follow up for multiple myeloma.   ASSESSMENT & PLAN:  Multiple myeloma (HCC) Kappa Light chain multiple myeloma beta 2 microglobulin 5 and normal albumin.  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  06/04/2021 5% plasma cells -August 2022-February 2023, patient completed 7 cycles of daratumumab/Pomalyst/dexamethasone. 11/20/2021, status post bone marrow biopsy which showed 1% of plasma cells.  Likely he has minimal residual disease. Currently on daratumumab maintenance. Labs are reviewed and discussed with patient. light chain ratio stable Proceed with daratumumab today .    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   Stage 3a chronic kidney disease (HCC) Avoid nephrotoxin.  Encourage hydration.  Thrombocytopenia (HCC) Stable. Monitor counts.    Orders Placed This Encounter  Procedures   Multiple Myeloma Panel (SPEP&IFE w/QIG)    Standing Status:   Future    Standing Expiration Date:   12/05/2023   Multiple Myeloma Panel (SPEP&IFE w/QIG)    Standing Status:   Future    Standing Expiration Date:   12/05/2023     Follow up in 2 weeks, Lab MD Dara  All questions were answered. The patient knows to call the  clinic with any problems, questions or concerns.  Earlie Server, MD, PhD [facility] Hematology Oncology 10/09/2022   HISTORY OF PRESENTING ILLNESS:  Luis Burke is a  66 y.o.  male with PMH listed below presents for follow up of multiple myeloma.  Oncology History  Multiple myeloma ([place])  08/19/2018 Bone Marrow Biopsy   08/19/2018 bone marrow biopsy showed hypercellular marrow 80%, involved by plasma cell neoplasm up to 95%.  Consistent with plasma cell myeloma. Myeloma FISH  gain of gain of CEP12   08/25/2018 Imaging   PET scan showed no definite hypermetabolic bone disease but CT findings are highly suspicious for numerous small myelomatous lesions involving the spine, sternum and scattered ribs.   08/26/2018 Initial Diagnosis   Multiple myeloma  # 07/27/2018 multiple myeloma panel showed M protein of 0.1, IgG 662, IgA 49, IgM 7. 08/10/2018, free light chain ratio showed extremely high level of kappa free light chain 10,183, with a kappa lambda light chain ratio of 1414.31,LDH 164, Beta-2 microglobulin 5     08/28/2018 - 03/17/2019 Chemotherapy   RVD x 9     04/23/2019 Bone Marrow Transplant   autologous stem cell bone marrow transplant at [facility].  He received preparative regimen with melphalan 200 mg/m on 04/22/2019 followed by autologous stem cell infusion on 04/23/2019. Transplant course was complicated with febrile neutropenia grade 3, treated with vancomycin and cephapirin until engraftment on 05/06/2019. Patient also had engraftment syndrome and had to be started on Solu-Medrol 25 mg twice daily on 05/05/2019.  Transition to Medrol Dosepak on 05/07/2019 to complete steroid taper as outpatient.  re-vaccination 12 months post transplant   08/10/2019 - 05/2022 Chemotherapy  Ixazomib 81m on D1/D8/D15 out of 28 day cycle       07/12/2020 [facility]  Lucent lesion in the L4 spinous process measuring 4 mm which is  nonspecific. Consider confirmation of  marrow replacing process with MRI.  2.  Incompletely characterized renal cysts    07/28/2020 Bone Marrow Biopsy    bone marrow normocellular marrow with polytypic plasmacytosis, 3 to 8%   Patient is in remission.  Cytogenetics negative for trisomy 12.  Negative myeloma FISH panel.   08/05/2020 Imaging   [facility].  Lumbar spondylosis is most pronounced at L5-S1 where there is severe right and moderate left neural foraminal stenosis. 08/11/2020, x-ray cervical spine complete with flexion and extension showed The cervical spine is visualized from C1 to the top of T1 on the lateral  view.  Reversal of the normal cervical lordosis with a focal kyphosis centered at the C5-C6 intervertebral level. Trace anterolisthesis of C4 onto C5. Trace retrolisthesis of C6 on C7. No evidence of dynamic instability is noted on  the flexion or extension views.   No prevertebral soft tissue swelling. Cervical vertebral body heights are maintained. Multilevel degenerative changes of the cervical spine including discogenic disease, most pronounced at C5-C6 and C6-C7, and facet arthropathy most pronounced at C4-C5. Multilevel mild to moderate left neural foraminal osseous narrowing of the mid to lower cervical vertebrae, possibly centrally/artifactual by patient positioning.     05/15/2021 Bone Marrow Biopsy   Bone marrow biopsy showed slightly hypercellular bone marrow for age with slight plasmacytosis.  5% plasma cells with kappa light chain restriction.  Cytogenetics normal.  Myeloma FISH negative I discussed with DOvilla we both feel that his bone marrow biopsy result reflects early relapse. Recommend stop Ixazomib. Switch to second line treatment with Daratumumab Pomlyst Dexamethasone   05/31/2021 - 05/20/2022 Chemotherapy   Patient is on Treatment Plan : MYELOMA Daratumumab + Pomalidomide + Dexamethasone q28d x 7  cycles     05/31/2021 -  Chemotherapy   Patient is on Treatment Plan : MYELOMA Daratumumab IV + Pomalidomide + Dexamethasone q28d x 7 cycles     09/21/2021 Imaging    PET scan showed no evidence of suspicious or new hypermetabolism, scattered small lucent foci seen previously in the sternum and the spine are less prominent today and have resolved in some regions.  Aortic atherosclerosis.   11/18/2021 Imaging   MRI cervical spine with and without contrast showed no multiple myeloma lesion or marrow edema identified in the cervical spine.  Abnormal dural thickening and a/or abnormal epidural space at C4/C5 levels.  This is nonspecific.  ER physician discussed the case with neurosurgery Dr. YCari Carawaywho feels that this is more likely degenerative disc disease.   11/20/2021 Bone Marrow Biopsy   bone marrow biopsy which showed 1% of plasma cells. The plasma cells generally display polyclonal staining pattern for kappa and lambda light chains although a few very small clusters appear kappa light chain restricted.  The findings are very limited but most suggestive of minimal residual plasma cell neoplasm especially in the presence of monoclonal protein with kappa light chain specificity. Normal cytogenetics and myeloma FISH   12/20/2021 -  Chemotherapy   Daratumumab monthly maintenance  # I discussed with patient's [facility] oncologist Dr. CMaylene Roes  We have agreed upon de-escalating his regimen to Daratumumab monthly maintenance for now. Discontinue  pomalyst.     Metastasis to bone ([place])   #01/14/2022, received IVIG 400 mg/kg x1   INTERVAL HISTORY Luis Burke is a 66 y.o. male who has above history reviewed by me today for follow-up multiple myeloma.  Denies any nausea vomiting diarrhea today Since September 2023, patient has had recurrent upper respiratory infection/bronchitis/sinusitis. Patient was evaluated by ENT, he was diagnosed with allergic rhinitis, nasal congestion is due to allergy and to  hypertrophy.  Chronic sinusitis, finished a course of clindamycin and a prednisone taper. Patient was reevaluated by ENT and his symptoms have resolved. Today patient has no new concerns.  Review of Systems  Constitutional:  Negative for appetite change, chills, fatigue, fever and unexpected weight change.  HENT:   Negative for hearing loss.   Eyes:  Negative for eye problems and icterus.  Respiratory:  Negative for chest tightness, cough and shortness of breath.   Cardiovascular:  Negative for chest pain and leg swelling.  Gastrointestinal:  Negative for abdominal distention and abdominal pain.  Endocrine: Negative for hot flashes.  Genitourinary:  Negative for difficulty urinating, dysuria and frequency.   Musculoskeletal:  Positive for back pain. Negative for arthralgias.  Skin:  Negative for itching and rash.  Neurological:  Negative for light-headedness and numbness.  Hematological:  Negative for adenopathy. Does not bruise/bleed easily.  Psychiatric/Behavioral:  Negative for confusion.     MEDICAL HISTORY:  Past Medical History:  Diagnosis Date   AKI (acute kidney injury) ([place]) 01/06/2019   Anemia    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[place])    autologous stem cell bone marrow transplant on 04/23/2019.   Fatty liver    on u/s 07/2018   Foot fracture    Hyperlipidemia 03/2004   Hypertension 1994   Multiple myeloma ([place])    Snores     SURGICAL HISTORY: Past Surgical History:  Procedure Laterality Date   CATARACT EXTRACTION Left 10/10/2021   CATARACT EXTRACTION W/PHACO Right 03/04/2018   Procedure: CATARACT EXTRACTION PHACO AND INTRAOCULAR LENS PLACEMENT ([place])  RIGHT TORIC;  Surgeon: Leandrew Koyanagi, MD;  Location: [place];  Service: Ophthalmology;  Laterality: Right;  [place] no Toric Lens 1:45 5.3   CATARACT EXTRACTION W/PHACO Left 10/10/2021   Procedure: CATARACT EXTRACTION PHACO AND INTRAOCULAR LENS PLACEMENT ([place]) LEFT;  Surgeon: Leandrew Koyanagi, MD;   Location: [place];  Service: Ophthalmology;  Laterality: Left;  5.16 1:03.8   COLONOSCOPY WITH PROPOFOL N/A 07/20/2018   Procedure: COLONOSCOPY WITH PROPOFOL;  Surgeon: Jonathon Bellows, MD;  Location: [facility] ENDOSCOPY;  Service: Gastroenterology;  Laterality: N/A;   ESOPHAGOGASTRODUODENOSCOPY (EGD) WITH PROPOFOL N/A 07/20/2018   Procedure: ESOPHAGOGASTRODUODENOSCOPY (EGD) WITH PROPOFOL;  Surgeon: Jonathon Bellows, MD;  Location[facility]COPY;  Service: Gastroenterology;  Laterality: N/A;   EYE SURGERY Right    HERNIA REPAIR     L Lap herniorraphy  04/2000   Left wrist ganglionectomy      SOCIAL HISTORY: Social History   Socioeconomic History   Marital status: Married    Spouse name: Not on file   Number of children: 1   Years of education: Not on file   Highest education level: Not on file  Occupational History   Occupation: [employer]  Tobacco Use   Smoking status: Never   Smokeless tobacco: Never   Tobacco comments:    Occassionally  Vaping Use   Vaping Use: Never used  Substance and Sexual Activity   Alcohol use: Not Currently    Alcohol/week: 3.0 standard drinks of  alcohol    Types: 3 Cans of beer per week   Drug use: No   Sexual activity: Not on file  Other Topics Concern   Not on file  Social History Narrative   Divorced 2009, married June 01, 2021.     His daughter died 4 days after giving birth to the patient's granddaughter   Has joint custody of his deceased daughter's child   Worked at [employer] is [place], retired 2020.     Social Determinants of Health   Financial Resource Strain: Not on file  Food Insecurity: Not on file  Transportation Needs: Not on file  Physical Activity: Not on file  Stress: Not on file  Social Connections: Not on file  Intimate Partner Violence: Not on file    FAMILY HISTORY: Family History  Problem Relation Age of Onset   Hypertension Mother    Diabetes Mother    Heart disease Mother        CAD   Dementia  Mother    Prostate cancer Father    Hypertension Father    Heart disease Father        MI 02/03   Colon cancer Father    Hypertension Sister    Hypertension Sister    Hypertension Sister     ALLERGIES:  is allergic to bactrim [sulfamethoxazole-trimethoprim], clonidine derivatives, and tadalafil.  MEDICATIONS:  Current Outpatient Medications  Medication Sig Dispense Refill   acetaminophen (TYLENOL) 325 MG tablet Take 2 tablets (650 mg total) by mouth See admin instructions. 1 hour prior to chemotherapy treatments 30 tablet 0   acyclovir (ZOVIRAX) 400 MG tablet Take 1 tablet (400 mg total) by mouth 2 (two) times daily. 60 tablet 2   amLODipine (NORVASC) 5 MG tablet Take 2 tablets (10 mg total) by mouth daily. 180 tablet 3   ASPIRIN 81 PO Take 81 mg by mouth daily.     B Complex Vitamins (VITAMIN B COMPLEX PO) Take 1 Dose by mouth daily.     co-enzyme Q-10 50 MG capsule Take 50 mg by mouth daily.     [employer] VITAMIN B12 1000 MCG tablet TAKE 1 TABLET BY MOUTH EVERY DAY 90 tablet 3   Denosumab (XGEVA [place]) Inject into the skin every 30 (thirty) days. Last rcvd 09/27/21     diphenhydrAMINE (BENADRYL) 50 MG tablet Take 1 tablet (50 mg total) by mouth See admin instructions. Take 1 tablet 1 hour prior to chemotherapy treatments. 30 tablet 0   hydrALAZINE (APRESOLINE) 10 MG tablet TAKE 1 TABLET(10 MG) BY MOUTH THREE TIMES DAILY 270 tablet 3   lisinopril (ZESTRIL) 20 MG tablet Take 1 tablet (20 mg total) by mouth daily. 90 tablet 3   magnesium chloride (SLOW-MAG) 64 MG TBEC SR tablet Take 1 tablet (64 mg total) by mouth daily. 60 tablet 0   metoprolol succinate (TOPROL-XL) 50 MG 24 hr tablet TAKE 4 TABLETS BY MOUTH EVERY DAY WITH OR IMMEDIATELY FOLLOWING A MEAL 360 tablet 3   montelukast (SINGULAIR) 10 MG tablet Take 1 tablet (10 mg total) by mouth See admin instructions. Take 1day prior to chemotherapy 30 tablet 1   ondansetron (ZOFRAN) 8 MG tablet Take 1 tablet (8 mg total) by mouth every 8 (eight)  hours as needed. for nausea 30 tablet 1   pravastatin (PRAVACHOL) 10 MG tablet Take 1 tablet (10 mg total) by mouth daily. 90 tablet 3   prochlorperazine (COMPAZINE) 10 MG tablet      sildenafil (REVATIO) 20 MG tablet TAKE 1-5 TABLETS (  20-100 MG TOTAL) BY MOUTH DAILY AS NEEDED 12 tablet 5   zinc gluconate 50 MG tablet Take 50 mg by mouth daily.     chlorpheniramine-HYDROcodone (TUSSIONEX) 10-8 MG/5ML Take 5 mLs by mouth at bedtime as needed for cough. (Patient not taking: Reported on 10/09/2022) 70 mL 0   predniSONE (DELTASONE) 20 MG tablet Take 1 tablet (20 mg) daily x1 week, then take half tablet (10 mg) daily x1 week, then stop (Patient not taking: Reported on 10/09/2022) 14 tablet 0   No current facility-administered medications for this visit.   Facility-Administered Medications Ordered in Other Visits  Medication Dose Route Frequency Provider Last Rate Last Admin   0.9 %  sodium chloride infusion   Intravenous Once Earlie Server, MD         PHYSICAL EXAMINATION: ECOG PERFORMANCE STATUS: 1 - Symptomatic but completely ambulatory Vitals:   10/09/22 0922  BP: (!) 145/90  Pulse: 71  Resp: 18  Temp: 98.7 F (37.1 C)   Filed Weights   10/09/22 0922  Weight: 205 lb 14.4 oz (93.4 kg)    Physical Exam Constitutional:      General: He is not in acute distress. HENT:     Head: Normocephalic and atraumatic.  Eyes:     General: No scleral icterus. Cardiovascular:     Rate and Rhythm: Normal rate and regular rhythm.     Heart sounds: Normal heart sounds.  Pulmonary:     Effort: Pulmonary effort is normal. No respiratory distress.     Breath sounds: No wheezing.  Abdominal:     General: Bowel sounds are normal. There is no distension.     Palpations: Abdomen is soft.  Musculoskeletal:        General: No deformity. Normal range of motion.     Cervical back: Normal range of motion and neck supple.  Skin:    General: Skin is warm and dry.     Findings: No erythema or rash.   Neurological:     Mental Status: He is alert and oriented to person, place, and time. Mental status is at baseline.     Cranial Nerves: No cranial nerve deficit.     Coordination: Coordination normal.  Psychiatric:        Mood and Affect: Mood normal.      LABORATORY DATA:  I have reviewed the data as listed Lab Results  Component Value Date   WBC 3.8 (L) 10/09/2022   HGB 13.8 10/09/2022   HCT 40.4 10/09/2022   MCV 100.0 10/09/2022   PLT 80 (L) 10/09/2022   Recent Labs    07/23/22 0838 07/30/22 1540 08/27/22 0858 10/09/22 0856  NA 137 136 138 141  K 3.9 4.2 3.7 3.8  CL 105 105 105 108  CO2 _0 GLUCOSE 107* 108* 120* 103*  BUN _1 CREATININE 1.44* 1.40* 1.19 1.19  CALCIUM 9.1 9.8 9.7 8.8*  GFRNONAA 54* 55* >60 >60  PROT 7.0  --  7.1 7.0  ALBUMIN 4.5  --  4.5 4.3  AST 21  --  24 21  ALT 14  --  24 20  ALKPHOS 64  --  58 50  BILITOT 0.9  --  0.9 0.6    Lab Results  Component Value Date   KPAFRELGTCHN 13.5 08/27/2022   LAMBDASER 4.2 (L) 08/27/2022   KAPLAMBRATIO 3.21 (H) 08/27/2022    RADIOGRAPHIC STUDIES: I have personally reviewed the radiological images as listed and agreed  with the findings in the report. CT MAXILLOFACIAL WO CONTRAST  Result Date: 09/11/2022 CLINICAL DATA:  Chronic sinusitis EXAM: CT MAXILLOFACIAL WITHOUT CONTRAST TECHNIQUE: Multidetector CT imaging of the maxillofacial structures was performed. Multiplanar CT image reconstructions were also generated. RADIATION DOSE REDUCTION: This exam was performed according to the departmental dose-optimization program which includes automated exposure control, adjustment of the mA and/or kV according to patient size and/or use of iterative reconstruction technique. COMPARISON:  06/14/2022 FINDINGS: Osseous: No osteolytic or osteoblastic changes identified. No traumatic abnormalities. Orbits: Negative. No traumatic or inflammatory finding. Sinuses: Mucoperiosteal thickening identified  consistent with chronic bilateral maxillary, ethmoid and sphenoid sinusitis. Air-fluid levels identified in the bilateral maxillary antrum consistent with acute inflammation. Soft tissues: Negative. Limited intracranial: No significant or unexpected finding. IMPRESSION: 1. Chronic bilateral maxillary, ethmoid and sphenoid sinusitis. 2. Air-fluid levels consistent with acute on chronic bilateral maxillary sinusitis. Electronically Signed   By: Sammie Bench M.D.   On: 09/11/2022 09:52   DG Chest 2 View  Result Date: 08/12/2022 CLINICAL DATA:  Productive cough and shortness of breath for 1 month. EXAM: CHEST - 2 VIEW COMPARISON:  Chest radiograph 07/30/2022 FINDINGS: The cardiomediastinal silhouette is normal. There is no focal consolidation or pulmonary edema. There is no pleural effusion or pneumothorax There is no acute osseous abnormality. IMPRESSION: Stable chest with no radiographic evidence of acute cardiopulmonary process. Electronically Signed   By: Valetta Mole M.D.   On: 08/12/2022 13:54   DG Chest 2 View  Result Date: 07/30/2022 CLINICAL DATA:  cough, SOB, h/o multiple myeloma EXAM: CHEST - 2 VIEW COMPARISON:  Radiograph 07/23/2022 FINDINGS: The heart size and mediastinal contours are within normal limits.No focal airspace disease. No pleural effusion or pneumothorax.Thoracic spondylosis. No acute osseous abnormality. IMPRESSION: No evidence of acute cardiopulmonary disease. Electronically Signed   By: Maurine Simmering M.D.   On: 07/30/2022 16:28   DG Chest 2 View  Result Date: 07/24/2022 CLINICAL DATA:  History of cough and congestion. History of hypertension and multiple myeloma. EXAM: CHEST - 2 VIEW COMPARISON:  Chest x-ray 07/12/2022, 10/23/2018.  PET-CT 09/20/2021. FINDINGS: Mediastinum and hilar structures normal. Heart size normal. No pulmonary venous congestion. Low lung volumes. Bilateral pleural-parenchymal thickening consistent with scarring again noted. Mild right base infiltrate  cannot be excluded. No pleural effusion or pneumothorax. Degenerative change thoracic spine. No acute or focal bony abnormality is identified. IMPRESSION: Low lung volumes. Bilateral pleural-parenchymal thickening consistent with scarring again noted. Mild right base infiltrate cannot be excluded. Electronically Signed   By: Marcello Moores  Register M.D.   On: 07/24/2022 08:48   DG Chest 2 View  Result Date: 07/12/2022 CLINICAL DATA:  Cough with blood in mucus.  Bronchitis. EXAM: CHEST - 2 VIEW COMPARISON:  PET-09/20/2021.  Chest radiographs 10/23/2018. FINDINGS: The cardiomediastinal silhouette is unchanged with normal heart size. Mild scarring or atelectasis is noted in the left lung base. The right lung is clear. No pleural effusion or pneumothorax is identified. No acute osseous abnormality is seen. IMPRESSION: Mild left basilar scarring or atelectasis. Electronically Signed   By: Logan Bores M.D.   On: 07/12/2022 11:31

## 2022-10-09 NOTE — Assessment & Plan Note (Signed)
Kappa Light chain multiple myeloma beta 2 microglobulin 5 and normal albumin.  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  06/04/2021 5% plasma cells -August 2022-February 2023, patient completed 7 cycles of daratumumab/Pomalyst/dexamethasone. 11/20/2021, status post bone marrow biopsy which showed 1% of plasma cells.  Likely he has minimal residual disease. Currently on daratumumab maintenance. Labs are reviewed and discussed with patient. light chain ratio stable Proceed with daratumumab today .

## 2022-10-09 NOTE — Assessment & Plan Note (Signed)
Stable.  Monitor counts. 

## 2022-10-09 NOTE — Progress Notes (Signed)
Pt here for follow. Pt reports that he had a sinus infection and was treated with antibiotics and steroids by ENT Dr. Hayes Ludwig. Pt is now feeling better,  no cough anymore.

## 2022-10-09 NOTE — Assessment & Plan Note (Signed)
Treatment plan as listed above. 

## 2022-10-09 NOTE — Assessment & Plan Note (Signed)
Majority of his lucencies were not hypermetabolic on previous PET scan Xgeva monthly- Continue calcium and vitamin D supplementation.  

## 2022-10-09 NOTE — Assessment & Plan Note (Signed)
IVIG if IgG <400   

## 2022-10-09 NOTE — Assessment & Plan Note (Signed)
Avoid nephrotoxin.  Encourage hydration. 

## 2022-10-10 LAB — KAPPA/LAMBDA LIGHT CHAINS
Kappa free light chain: 13.8 mg/L (ref 3.3–19.4)
Kappa, lambda light chain ratio: 2.09 — ABNORMAL HIGH (ref 0.26–1.65)
Lambda free light chains: 6.6 mg/L (ref 5.7–26.3)

## 2022-10-15 ENCOUNTER — Ambulatory Visit: Payer: Medicare Other | Admitting: Oncology

## 2022-10-15 ENCOUNTER — Other Ambulatory Visit: Payer: Medicare Other

## 2022-10-15 ENCOUNTER — Ambulatory Visit: Payer: Medicare Other

## 2022-10-15 ENCOUNTER — Other Ambulatory Visit: Payer: Self-pay | Admitting: Oncology

## 2022-10-15 ENCOUNTER — Telehealth: Payer: Self-pay | Admitting: Oncology

## 2022-10-15 NOTE — Telephone Encounter (Signed)
Patient will be on a cruise on Feb 21 and would like to reschedule his Chemo to 2/26,27,or 28. Please advise.   He would like a call back  [phone]

## 2022-10-16 ENCOUNTER — Ambulatory Visit: Payer: Medicare Other | Admitting: Oncology

## 2022-10-16 ENCOUNTER — Ambulatory Visit: Payer: Medicare Other

## 2022-10-16 ENCOUNTER — Other Ambulatory Visit: Payer: Self-pay

## 2022-10-16 ENCOUNTER — Other Ambulatory Visit: Payer: Medicare Other

## 2022-10-16 LAB — MULTIPLE MYELOMA PANEL, SERUM
Albumin SerPl Elph-Mcnc: 4.2 g/dL (ref 2.9–4.4)
Albumin/Glob SerPl: 2.2 — ABNORMAL HIGH (ref 0.7–1.7)
Alpha 1: 0.2 g/dL (ref 0.0–0.4)
Alpha2 Glob SerPl Elph-Mcnc: 0.6 g/dL (ref 0.4–1.0)
B-Globulin SerPl Elph-Mcnc: 0.8 g/dL (ref 0.7–1.3)
Gamma Glob SerPl Elph-Mcnc: 0.3 g/dL — ABNORMAL LOW (ref 0.4–1.8)
Globulin, Total: 2 g/dL — ABNORMAL LOW (ref 2.2–3.9)
IgA: 109 mg/dL (ref 61–437)
IgG (Immunoglobin G), Serum: 470 mg/dL — ABNORMAL LOW (ref 603–1613)
IgM (Immunoglobulin M), Srm: 9 mg/dL — ABNORMAL LOW (ref 20–172)
Total Protein ELP: 6.2 g/dL (ref 6.0–8.5)

## 2022-10-22 ENCOUNTER — Ambulatory Visit: Payer: Medicare Other

## 2022-10-22 ENCOUNTER — Other Ambulatory Visit: Payer: Medicare Other

## 2022-10-22 ENCOUNTER — Ambulatory Visit: Payer: Medicare Other | Admitting: Oncology

## 2022-10-23 ENCOUNTER — Other Ambulatory Visit: Payer: Self-pay

## 2022-10-28 ENCOUNTER — Other Ambulatory Visit: Payer: Self-pay

## 2022-10-28 DIAGNOSIS — C9000 Multiple myeloma not having achieved remission: Secondary | ICD-10-CM

## 2022-10-28 NOTE — Progress Notes (Signed)
The patient has been taking dexamethasone at home prior to daratumumab injections. After discussion with the provider, the offset time of daratumumab was changed to 30 minutes, starting 11/06/2022, in order to reduce patient wait time. A calendar was not sent to the patient because he has already been taking dexamethasone at home.  Laray Anger, PharmD PGY-2 Pharmacy Resident Hematology/Oncology [phone]  10/28/2022 2:01 PM

## 2022-10-30 ENCOUNTER — Other Ambulatory Visit: Payer: Self-pay

## 2022-10-30 MED ORDER — DEXAMETHASONE 4 MG PO TABS: 20.0000 mg | ORAL_TABLET | ORAL | 1 refills | Status: DC

## 2022-11-06 ENCOUNTER — Other Ambulatory Visit: Payer: Medicare Other

## 2022-11-06 ENCOUNTER — Ambulatory Visit: Payer: Medicare Other | Admitting: Oncology

## 2022-11-06 ENCOUNTER — Inpatient Hospital Stay: Payer: Medicare Other

## 2022-11-06 ENCOUNTER — Encounter: Payer: Self-pay | Admitting: Oncology

## 2022-11-06 ENCOUNTER — Inpatient Hospital Stay (HOSPITAL_BASED_OUTPATIENT_CLINIC_OR_DEPARTMENT_OTHER): Payer: Medicare Other | Admitting: Oncology

## 2022-11-06 ENCOUNTER — Ambulatory Visit: Payer: Medicare Other

## 2022-11-06 ENCOUNTER — Inpatient Hospital Stay: Payer: Medicare Other | Attending: Oncology

## 2022-11-06 VITALS — BP 153/92 | HR 63 | Temp 96.4°F | Wt 208.3 lb

## 2022-11-06 DIAGNOSIS — C9000 Multiple myeloma not having achieved remission: Secondary | ICD-10-CM

## 2022-11-06 DIAGNOSIS — N1831 Chronic kidney disease, stage 3a: Secondary | ICD-10-CM | POA: Insufficient documentation

## 2022-11-06 DIAGNOSIS — C7951 Secondary malignant neoplasm of bone: Secondary | ICD-10-CM

## 2022-11-06 DIAGNOSIS — R771 Abnormality of globulin: Secondary | ICD-10-CM

## 2022-11-06 DIAGNOSIS — Z5112 Encounter for antineoplastic immunotherapy: Secondary | ICD-10-CM | POA: Insufficient documentation

## 2022-11-06 DIAGNOSIS — Z5111 Encounter for antineoplastic chemotherapy: Secondary | ICD-10-CM | POA: Diagnosis not present

## 2022-11-06 DIAGNOSIS — Z9484 Stem cells transplant status: Secondary | ICD-10-CM | POA: Diagnosis not present

## 2022-11-06 DIAGNOSIS — Z9481 Bone marrow transplant status: Secondary | ICD-10-CM | POA: Insufficient documentation

## 2022-11-06 DIAGNOSIS — N179 Acute kidney failure, unspecified: Secondary | ICD-10-CM

## 2022-11-06 DIAGNOSIS — D696 Thrombocytopenia, unspecified: Secondary | ICD-10-CM | POA: Insufficient documentation

## 2022-11-06 DIAGNOSIS — I129 Hypertensive chronic kidney disease with stage 1 through stage 4 chronic kidney disease, or unspecified chronic kidney disease: Secondary | ICD-10-CM | POA: Diagnosis not present

## 2022-11-06 LAB — CBC WITH DIFFERENTIAL/PLATELET
Abs Immature Granulocytes: 0.02 10*3/uL (ref 0.00–0.07)
Basophils Absolute: 0 10*3/uL (ref 0.0–0.1)
Basophils Relative: 0 %
Eosinophils Absolute: 0.1 10*3/uL (ref 0.0–0.5)
Eosinophils Relative: 1 %
HCT: 41.9 % (ref 39.0–52.0)
Hemoglobin: 14.2 g/dL (ref 13.0–17.0)
Immature Granulocytes: 0 %
Lymphocytes Relative: 33 %
Lymphs Abs: 1.9 10*3/uL (ref 0.7–4.0)
MCH: 34.1 pg — ABNORMAL HIGH (ref 26.0–34.0)
MCHC: 33.9 g/dL (ref 30.0–36.0)
MCV: 100.7 fL — ABNORMAL HIGH (ref 80.0–100.0)
Monocytes Absolute: 0.5 10*3/uL (ref 0.1–1.0)
Monocytes Relative: 8 %
Neutro Abs: 3.3 10*3/uL (ref 1.7–7.7)
Neutrophils Relative %: 58 %
Platelets: 92 10*3/uL — ABNORMAL LOW (ref 150–400)
RBC: 4.16 MIL/uL — ABNORMAL LOW (ref 4.22–5.81)
RDW: 13.2 % (ref 11.5–15.5)
WBC: 5.7 10*3/uL (ref 4.0–10.5)
nRBC: 0 % (ref 0.0–0.2)

## 2022-11-06 LAB — COMPREHENSIVE METABOLIC PANEL
ALT: 17 U/L (ref 0–44)
AST: 21 U/L (ref 15–41)
Albumin: 4.3 g/dL (ref 3.5–5.0)
Alkaline Phosphatase: 61 U/L (ref 38–126)
Anion gap: 11 (ref 5–15)
BUN: 21 mg/dL (ref 8–23)
CO2: 25 mmol/L (ref 22–32)
Calcium: 9.4 mg/dL (ref 8.9–10.3)
Chloride: 104 mmol/L (ref 98–111)
Creatinine, Ser: 1.26 mg/dL — ABNORMAL HIGH (ref 0.61–1.24)
GFR, Estimated: >60 mL/min (ref >60)
Glucose, Bld: 100 mg/dL — ABNORMAL HIGH (ref 70–99)
Potassium: 4.1 mmol/L (ref 3.5–5.1)
Sodium: 140 mmol/L (ref 135–145)
Total Bilirubin: 0.6 mg/dL (ref 0.3–1.2)
Total Protein: 6.9 g/dL (ref 6.5–8.1)

## 2022-11-06 MED ORDER — DENOSUMAB 120 MG/1.7ML ~~LOC~~ SOLN
120.0000 mg | Freq: Once | SUBCUTANEOUS | Status: AC
  Administered 2022-11-06: 120 mg via SUBCUTANEOUS
  Filled 2022-11-06: qty 1.7

## 2022-11-06 MED ORDER — DIPHENHYDRAMINE HCL 25 MG PO CAPS
50.0000 mg | ORAL_CAPSULE | Freq: Once | ORAL | Status: AC
  Administered 2022-11-06: 50 mg via ORAL
  Filled 2022-11-06: qty 2

## 2022-11-06 MED ORDER — DARATUMUMAB-HYALURONIDASE-FIHJ 1800-30000 MG-UT/15ML ~~LOC~~ SOLN
1800.0000 mg | Freq: Once | SUBCUTANEOUS | Status: AC
  Administered 2022-11-06: 1800 mg via SUBCUTANEOUS
  Filled 2022-11-06: qty 15

## 2022-11-06 NOTE — Assessment & Plan Note (Signed)
Treatment plan as listed above

## 2022-11-06 NOTE — Assessment & Plan Note (Signed)
Avoid nephrotoxin.  Encourage hydration. 

## 2022-11-06 NOTE — Assessment & Plan Note (Addendum)
Kappa Light chain multiple myeloma beta 2 microglobulin 5 and normal albumin.  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  06/04/2021 5% plasma cells -August 2022-February 2023, patient completed 7 cycles of daratumumab/Pomalyst/dexamethasone. 11/20/2021, status post bone marrow biopsy which showed 1% of plasma cells.  Likely he has minimal residual disease. Currently on daratumumab maintenance. He takes his supply of dexamethasone '20mg'$  prior to his treatment.  Labs are reviewed and discussed with patient. light chain ratio stable Proceed with daratumumab today .

## 2022-11-06 NOTE — Assessment & Plan Note (Signed)
IVIG if IgG <400   

## 2022-11-06 NOTE — Progress Notes (Signed)
Hematology/Oncology Progress note [phone] Fax:([phone]) [phone]      REASON FOR VISIT Follow up for multiple myeloma.   ASSESSMENT & PLAN:   Multiple myeloma (HCC) Kappa Light chain multiple myeloma beta 2 microglobulin 5 and normal albumin.  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  06/04/2021 5% plasma cells -August 2022-February 2023, patient completed 7 cycles of daratumumab/Pomalyst/dexamethasone. 11/20/2021, status post bone marrow biopsy which showed 1% of plasma cells.  Likely he has minimal residual disease. Currently on daratumumab maintenance. He takes his supply of dexamethasone '20mg'$  prior to his treatment.  Labs are reviewed and discussed with patient. light chain ratio stable Proceed with daratumumab today .    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   Stage 3a chronic kidney disease (HCC) Avoid nephrotoxin.  Encourage hydration.  Thrombocytopenia (HCC) Stable. Monitor counts.    No orders of the defined types were placed in this encounter.  Follow up in 4 weeks, Lab MD Dara  All questions were answered. The patient knows to call the clinic with any problems, questions or concerns.  Luis Server, MD, PhD [facility] Hematology Oncology 11/06/2022   HISTORY OF PRESENTING ILLNESS:  Luis Burke is a  67 y.o.  male with PMH listed below presents for follow up of multiple myeloma.  Oncology History  Multiple myeloma ([place])  08/19/2018 Bone Marrow Biopsy   08/19/2018 bone marrow biopsy showed hypercellular marrow 80%, involved by plasma cell  neoplasm up to 95%.  Consistent with plasma cell myeloma. Myeloma FISH  gain of gain of CEP12   08/25/2018 Imaging   PET scan showed no definite hypermetabolic bone disease but CT findings are highly suspicious for numerous small myelomatous lesions involving the spine, sternum and scattered ribs.   08/26/2018 Initial Diagnosis   Multiple myeloma  # 07/27/2018 multiple myeloma panel showed M protein of 0.1, IgG 662, IgA 49, IgM 7. 08/10/2018, free light chain ratio showed extremely high level of kappa free light chain 10,183, with a kappa lambda light chain ratio of 1414.31,LDH 164, Beta-2 microglobulin 5     08/28/2018 - 03/17/2019 Chemotherapy   RVD x 9     04/23/2019 Bone Marrow Transplant   autologous stem cell bone marrow transplant at [facility].  He received preparative regimen with melphalan 200 mg/m on 04/22/2019 followed by autologous stem cell infusion on 04/23/2019. Transplant course was complicated with febrile neutropenia grade 3, treated with vancomycin and cephapirin until engraftment on 05/06/2019. Patient also had engraftment syndrome and had to be started on Solu-Medrol 25 mg twice daily on 05/05/2019.  Transition to Medrol Dosepak on 05/07/2019 to complete steroid taper as outpatient.  re-vaccination 12 months post transplant   08/10/2019 - 05/2022 Chemotherapy    Ixazomib '3mg'$  on D1/D8/D15 out of 28 day cycle       07/12/2020 Imaging   CT skeletal survey done at [facility] 1.  Lucent lesion in the L4 spinous process measuring 4 mm which is  nonspecific. Consider confirmation of marrow replacing process with MRI.  2.  Incompletely characterized renal cysts    07/28/2020 Bone Marrow Biopsy    bone marrow normocellular marrow with polytypic plasmacytosis,  3 to 8%   Patient is in remission.  Cytogenetics negative for trisomy 12.  Negative myeloma FISH panel.   08/05/2020 Imaging   MRI lumbar spine with and without contrast was done at [place] showed no evidence of spinal metastasis.   Lumbar spondylosis is most pronounced at L5-S1 where there is severe right and moderate left neural foraminal stenosis. 08/11/2020, x-ray cervical spine complete with flexion and extension showed The cervical spine is visualized from C1 to the top of T1 on the lateral  view.  Reversal of the normal cervical lordosis with a focal kyphosis centered at the C5-C6 intervertebral level. Trace anterolisthesis of C4 onto C5. Trace retrolisthesis of C6 on C7. No evidence of dynamic instability is noted on  the flexion or extension views.   No prevertebral soft tissue swelling. Cervical vertebral body heights are maintained. Multilevel degenerative changes of the cervical spine including discogenic disease, most pronounced at C5-C6 and C6-C7, and facet arthropathy most pronounced at C4-C5. Multilevel mild to moderate left neural foraminal osseous narrowing of the mid to lower cervical vertebrae, possibly centrally/artifactual by patient positioning.     05/15/2021 Bone Marrow Biopsy   Bone marrow biopsy showed slightly hypercellular bone marrow for age with slight plasmacytosis.  5% plasma cells with kappa light chain restriction.  Cytogenetics normal.  Myeloma FISH negative I discussed with Luis Burke. we both feel that his bone marrow biopsy result reflects early relapse. Recommend stop Ixazomib. Switch to second line treatment with Daratumumab Pomlyst Dexamethasone   05/31/2021 - 05/20/2022 Chemotherapy   Patient is on Treatment Plan : MYELOMA Daratumumab + Pomalidomide + Dexamethasone q28d x 7 cycles     05/31/2021 -  Chemotherapy   Patient is on Treatment Plan : MYELOMA Daratumumab IV + Pomalidomide + Dexamethasone q28d x 7 cycles     09/21/2021 Imaging    PET scan showed no evidence of suspicious or new hypermetabolism, scattered small lucent foci seen previously in the sternum and the spine are less prominent today and have resolved in some regions.  Aortic atherosclerosis.   11/18/2021 Imaging    MRI cervical spine with and without contrast showed no multiple myeloma lesion or marrow edema identified in the cervical spine.  Abnormal dural thickening and a/or abnormal epidural space at C4/C5 levels.  This is nonspecific.  ER physician discussed the case with neurosurgery Dr. Cari Caraway who feels that this is more likely degenerative disc disease.   11/20/2021 Bone Marrow Biopsy   bone marrow biopsy which showed 1% of plasma cells. The plasma cells generally display polyclonal staining pattern for kappa and lambda light chains although a few very small clusters appear kappa light chain restricted.  The findings are very limited but most suggestive of minimal residual plasma cell neoplasm especially in the presence of monoclonal protein with kappa light chain specificity. Normal cytogenetics and myeloma FISH   12/20/2021 -  Chemotherapy   Daratumumab monthly maintenance  # I discussed with patient's [facility] oncologist Dr. Maylene Roes.  We have agreed upon de-escalating his regimen to Daratumumab monthly maintenance for now. Discontinue pomalyst.     Metastasis to bone [facility])   September -Nov 2023, patient has had recurrent upper respiratory infection/bronchitis/sinusitis. Patient was evaluated by ENT, he was diagnosed with allergic rhinitis, nasal congestion is due to allergy and to hypertrophy.  Chronic sinusitis, finished a course of clindamycin and a prednisone taper.   INTERVAL HISTORY Luis Burke is a 67 y.o. male who has above history reviewed by me today  for follow-up multiple myeloma.  Denies any nausea vomiting diarrhea today Patient has no new complaints.  Review of Systems  Constitutional:  Negative for appetite change, chills, fatigue, fever and unexpected weight change.  HENT:   Negative for hearing loss.   Eyes:  Negative for eye problems and icterus.  Respiratory:  Negative for chest tightness, cough and shortness of breath.   Cardiovascular:  Negative for chest pain and leg  swelling.  Gastrointestinal:  Negative for abdominal distention and abdominal pain.  Endocrine: Negative for hot flashes.  Genitourinary:  Negative for difficulty urinating, dysuria and frequency.   Musculoskeletal:  Positive for back pain. Negative for arthralgias.  Skin:  Negative for itching and rash.  Neurological:  Negative for light-headedness and numbness.  Hematological:  Negative for adenopathy. Does not bruise/bleed easily.  Psychiatric/Behavioral:  Negative for confusion.     MEDICAL HISTORY:  Past Medical History:  Diagnosis Date   AKI (acute kidney injury) ([place]) 01/06/2019   Anemia    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[place])    autologous stem cell bone marrow transplant on 04/23/2019.   Fatty liver    on u/s 07/2018   Foot fracture    Hyperlipidemia 03/2004   Hypertension 1994   Multiple myeloma ([place])    Snores     SURGICAL HISTORY: Past Surgical History:  Procedure Laterality Date   CATARACT EXTRACTION Left 10/10/2021   CATARACT EXTRACTION W/PHACO Right 03/04/2018   Procedure: CATARACT EXTRACTION PHACO AND INTRAOCULAR LENS PLACEMENT ([place])  RIGHT TORIC;  Surgeon: Leandrew Koyanagi, MD;  Location: [place];  Service: Ophthalmology;  Laterality: Right;  Per [place] no Toric Lens 1:45 5.3   CATARACT EXTRACTION W/PHACO Left 10/10/2021   Procedure: CATARACT EXTRACTION PHACO AND INTRAOCULAR LENS PLACEMENT ([place]) LEFT;  Surgeon: Leandrew Koyanagi, MD;  Location: [place];  Service: Ophthalmology;  Laterality: Left;  5.16 1:03.8   COLONOSCOPY WITH PROPOFOL N/A 07/20/2018   Procedure: COLONOSCOPY WITH PROPOFOL;  Surgeon: Jonathon Bellows, MD;  Location: [facility] ENDOSCOPY;  Service: Gastroenterology;  Laterality: N/A;   ESOPHAGOGASTRODUODENOSCOPY (EGD) WITH PROPOFOL N/A 07/20/2018   Procedure: ESOPHAGOGASTRODUODENOSCOPY (EGD) WITH PROPOFOL;  Surgeon: Jonathon Bellows, MD;  Location: [facility] ENDOSCOPY;  Service: Gastroenterology;  Laterality: N/A;   EYE SURGERY Right     HERNIA REPAIR     L Lap herniorraphy  04/2000   Left wrist ganglionectomy      SOCIAL HISTORY: Social History   Socioeconomic History   Marital status: Married    Spouse name: Not on file   Number of children: 1   Years of education: Not on file   Highest education level: Not on file  Occupational History   Occupation: [employer]  Tobacco Use   Smoking status: Never   Smokeless tobacco: Never   Tobacco comments:    Occassionally  Vaping Use   Vaping Use: Never used  Substance and Sexual Activity   Alcohol use: Not Currently    Alcohol/week: 3.0 standard drinks of alcohol    Types: 3 Cans of beer per week   Drug use: No   Sexual activity: Not on file  Other Topics Concern   Not on file  Social History Narrative   Divorced 2009, married 2021-06-19.     His daughter died 4 days after giving birth to the patient's granddaughter   Has joint custody of his deceased daughter's child   Worked at [employer] is [place], retired 2020.     Social Determinants of Radio broadcast assistant  Strain: Not on file  Food Insecurity: Not on file  Transportation Needs: Not on file  Physical Activity: Not on file  Stress: Not on file  Social Connections: Not on file  Intimate Partner Violence: Not on file    FAMILY HISTORY: Family History  Problem Relation Age of Onset   Hypertension Mother    Diabetes Mother    Heart disease Mother        CAD   Dementia Mother    Prostate cancer Father    Hypertension Father    Heart disease Father        MI 02/03   Colon cancer Father    Hypertension Sister    Hypertension Sister    Hypertension Sister     ALLERGIES:  is allergic to bactrim [sulfamethoxazole-trimethoprim], clonidine derivatives, and tadalafil.  MEDICATIONS:  Current Outpatient Medications  Medication Sig Dispense Refill   acetaminophen (TYLENOL) 325 MG tablet Take 2 tablets (650 mg total) by mouth See admin instructions. 1 hour prior to chemotherapy  treatments 30 tablet 0   acyclovir (ZOVIRAX) 400 MG tablet Take 1 tablet (400 mg total) by mouth 2 (two) times daily. 60 tablet 2   amLODipine (NORVASC) 5 MG tablet Take 2 tablets (10 mg total) by mouth daily. 180 tablet 3   ASPIRIN 81 PO Take 81 mg by mouth daily.     B Complex Vitamins (VITAMIN B COMPLEX PO) Take 1 Dose by mouth daily.     co-enzyme Q-10 50 MG capsule Take 50 mg by mouth daily.     [employer] VITAMIN B12 1000 MCG tablet TAKE 1 TABLET BY MOUTH EVERY DAY 90 tablet 3   Denosumab (XGEVA [place]) Inject into the skin every 30 (thirty) days. Last rcvd 09/27/21     dexamethasone (DECADRON) 4 MG tablet Take 5 tablets (20 mg total) by mouth as directed. Take one hour before monthly Darzalex injection. 45 tablet 1   diphenhydrAMINE (BENADRYL) 50 MG tablet Take 1 tablet (50 mg total) by mouth See admin instructions. Take 1 tablet 1 hour prior to chemotherapy treatments. 30 tablet 0   hydrALAZINE (APRESOLINE) 10 MG tablet TAKE 1 TABLET(10 MG) BY MOUTH THREE TIMES DAILY 270 tablet 3   lisinopril (ZESTRIL) 20 MG tablet Take 1 tablet (20 mg total) by mouth daily. 90 tablet 3   magnesium chloride (SLOW-MAG) 64 MG TBEC SR tablet Take 1 tablet (64 mg total) by mouth daily. 60 tablet 0   metoprolol succinate (TOPROL-XL) 50 MG 24 hr tablet TAKE 4 TABLETS BY MOUTH EVERY DAY WITH OR IMMEDIATELY FOLLOWING A MEAL 360 tablet 3   montelukast (SINGULAIR) 10 MG tablet Take 1 tablet (10 mg total) by mouth See admin instructions. Take 1day prior to chemotherapy 30 tablet 1   ondansetron (ZOFRAN) 8 MG tablet Take 1 tablet (8 mg total) by mouth every 8 (eight) hours as needed. for nausea 30 tablet 1   pravastatin (PRAVACHOL) 10 MG tablet Take 1 tablet (10 mg total) by mouth daily. 90 tablet 3   prochlorperazine (COMPAZINE) 10 MG tablet      sildenafil (REVATIO) 20 MG tablet TAKE 1-5 TABLETS (20-100 MG TOTAL) BY MOUTH DAILY AS NEEDED 12 tablet 5   zinc gluconate 50 MG tablet Take 50 mg by mouth daily.      chlorpheniramine-HYDROcodone (TUSSIONEX) 10-8 MG/5ML Take 5 mLs by mouth at bedtime as needed for cough. (Patient not taking: Reported on 10/09/2022) 70 mL 0   predniSONE (DELTASONE) 20 MG tablet Take 1 tablet (20  mg) daily x1 week, then take half tablet (10 mg) daily x1 week, then stop (Patient not taking: Reported on 11/06/2022) 14 tablet 0   No current facility-administered medications for this visit.   Facility-Administered Medications Ordered in Other Visits  Medication Dose Route Frequency Provider Last Rate Last Admin   0.9 %  sodium chloride infusion   Intravenous Once Luis Server, MD         PHYSICAL EXAMINATION: ECOG PERFORMANCE STATUS: 1 - Symptomatic but completely ambulatory Vitals:   11/06/22 0902  BP: (!) 153/92  Pulse: 63  Temp: (!) 96.4 F (35.8 C)  SpO2: 99%   Filed Weights   11/06/22 0902  Weight: 208 lb 4.8 oz (94.5 kg)    Physical Exam Constitutional:      General: He is not in acute distress. HENT:     Head: Normocephalic and atraumatic.  Eyes:     General: No scleral icterus. Cardiovascular:     Rate and Rhythm: Normal rate and regular rhythm.     Heart sounds: Normal heart sounds.  Pulmonary:     Effort: Pulmonary effort is normal. No respiratory distress.     Breath sounds: No wheezing.  Abdominal:     General: Bowel sounds are normal. There is no distension.     Palpations: Abdomen is soft.  Musculoskeletal:        General: No deformity. Normal range of motion.     Cervical back: Normal range of motion and neck supple.  Skin:    General: Skin is warm and dry.     Findings: No erythema or rash.  Neurological:     Mental Status: He is alert and oriented to person, place, and time. Mental status is at baseline.     Cranial Nerves: No cranial nerve deficit.     Coordination: Coordination normal.  Psychiatric:        Mood and Affect: Mood normal.      LABORATORY DATA:  I have reviewed the data as listed    Latest Ref Rng & Units 11/06/2022     8:49 AM 10/09/2022    8:56 AM 08/27/2022    8:58 AM  CBC  WBC 4.0 - 10.5 K/uL 5.7  3.8  11.2   Hemoglobin 13.0 - 17.0 g/dL 14.2  13.8  14.5   Hematocrit 39.0 - 52.0 % 41.9  40.4  43.3   Platelets 150 - 400 K/uL 92  80  98       Latest Ref Rng & Units 11/06/2022    8:49 AM 10/09/2022    8:56 AM 08/27/2022    8:58 AM  CMP  Glucose 70 - 99 mg/dL 100  103  120   BUN 8 - 23 mg/dL '21  14  18   '$ Creatinine 0.61 - 1.24 mg/dL 1.26  1.19  1.19   Sodium 135 - 145 mmol/L 140  141  138   Potassium 3.5 - 5.1 mmol/L 4.1  3.8  3.7   Chloride 98 - 111 mmol/L 104  108  105   CO2 22 - 32 mmol/L '25  25  24   '$ Calcium 8.9 - 10.3 mg/dL 9.4  8.8  9.7   Total Protein 6.5 - 8.1 g/dL 6.9  7.0  7.1   Total Bilirubin 0.3 - 1.2 mg/dL 0.6  0.6  0.9   Alkaline Phos 38 - 126 U/L 61  50  58   AST 15 - 41 U/L 21  21  24  ALT 0 - 44 U/L '17  20  24      '$ Lab Results  Component Value Date   KPAFRELGTCHN 13.8 10/09/2022   LAMBDASER 6.6 10/09/2022   KAPLAMBRATIO 2.09 (H) 10/09/2022    RADIOGRAPHIC STUDIES: I have personally reviewed the radiological images as listed and agreed with the findings in the report. CT MAXILLOFACIAL WO CONTRAST  Result Date: 09/11/2022 CLINICAL DATA:  Chronic sinusitis EXAM: CT MAXILLOFACIAL WITHOUT CONTRAST TECHNIQUE: Multidetector CT imaging of the maxillofacial structures was performed. Multiplanar CT image reconstructions were also generated. RADIATION DOSE REDUCTION: This exam was performed according to the departmental dose-optimization program which includes automated exposure control, adjustment of the mA and/or kV according to patient size and/or use of iterative reconstruction technique. COMPARISON:  06/14/2022 FINDINGS: Osseous: No osteolytic or osteoblastic changes identified. No traumatic abnormalities. Orbits: Negative. No traumatic or inflammatory finding. Sinuses: Mucoperiosteal thickening identified consistent with chronic bilateral maxillary, ethmoid and sphenoid  sinusitis. Air-fluid levels identified in the bilateral maxillary antrum consistent with acute inflammation. Soft tissues: Negative. Limited intracranial: No significant or unexpected finding. IMPRESSION: 1. Chronic bilateral maxillary, ethmoid and sphenoid sinusitis. 2. Air-fluid levels consistent with acute on chronic bilateral maxillary sinusitis. Electronically Signed   By: Sammie Bench M.D.   On: 09/11/2022 09:52   DG Chest 2 View  Result Date: 08/12/2022 CLINICAL DATA:  Productive cough and shortness of breath for 1 month. EXAM: CHEST - 2 VIEW COMPARISON:  Chest radiograph 07/30/2022 FINDINGS: The cardiomediastinal silhouette is normal. There is no focal consolidation or pulmonary edema. There is no pleural effusion or pneumothorax There is no acute osseous abnormality. IMPRESSION: Stable chest with no radiographic evidence of acute cardiopulmonary process. Electronically Signed   By: Valetta Mole M.D.   On: 08/12/2022 13:54

## 2022-11-06 NOTE — Patient Instructions (Signed)
[facility]  Discharge Instructions: Thank you for choosing [facility] to provide your oncology and hematology care.  If you have a lab appointment with the Cancer Center, please go directly to the Cancer Center and check in at the registration area.  Wear comfortable clothing and clothing appropriate for easy access to any Portacath or PICC line.   We strive to give you quality time with your provider. You may need to reschedule your appointment if you arrive late (15 or more minutes).  Arriving late affects you and other patients whose appointments are after yours.  Also, if you miss three or more appointments without notifying the office, you may be dismissed from the clinic at the provider's discretion.      For prescription refill requests, have your pharmacy contact our office and allow 72 hours for refills to be completed.    Today you received the following chemotherapy and/or immunotherapy agents Darzalex      To help prevent nausea and vomiting after your treatment, we encourage you to take your nausea medication as directed.  BELOW ARE SYMPTOMS THAT SHOULD BE REPORTED IMMEDIATELY: *FEVER GREATER THAN 100.4 F (38 C) OR HIGHER *CHILLS OR SWEATING *NAUSEA AND VOMITING THAT IS NOT CONTROLLED WITH YOUR NAUSEA MEDICATION *UNUSUAL SHORTNESS OF BREATH *UNUSUAL BRUISING OR BLEEDING *URINARY PROBLEMS (pain or burning when urinating, or frequent urination) *BOWEL PROBLEMS (unusual diarrhea, constipation, pain near the anus) TENDERNESS IN MOUTH AND THROAT WITH OR WITHOUT PRESENCE OF ULCERS (sore throat, sores in mouth, or a toothache) UNUSUAL RASH, SWELLING OR PAIN  UNUSUAL VAGINAL DISCHARGE OR ITCHING   Items with * indicate a potential emergency and should be followed up as soon as possible or go to the Emergency Department if any problems should occur.  Please show the CHEMOTHERAPY ALERT CARD or IMMUNOTHERAPY ALERT CARD at check-in to  the Emergency Department and triage nurse.  Should you have questions after your visit or need to cancel or reschedule your appointment, please contact [facility]  [phone] and follow the prompts.  Office hours are 8:00 a.m. to 4:30 p.m. Monday - Friday. Please note that voicemails left after 4:00 p.m. may not be returned until the following business day.  We are closed weekends and major holidays. You have access to a nurse at all times for urgent questions. Please call the main number to the clinic [phone] and follow the prompts.  For any non-urgent questions, you may also contact your provider using MyChart. We now offer e-Visits for anyone 18 and older to request care online for non-urgent symptoms. For details visit [email or web address].   Also download the MyChart app! Go to the app store, search "MyChart", open the app, select [facility], and log in with your MyChart username and password.      

## 2022-11-06 NOTE — Assessment & Plan Note (Signed)
Majority of his lucencies were not hypermetabolic on previous PET scan Xgeva monthly- Continue calcium and vitamin D supplementation.  

## 2022-11-06 NOTE — Assessment & Plan Note (Signed)
Stable.  Monitor counts. 

## 2022-11-07 LAB — KAPPA/LAMBDA LIGHT CHAINS
Kappa free light chain: 15.3 mg/L (ref 3.3–19.4)
Kappa, lambda light chain ratio: 2.68 — ABNORMAL HIGH (ref 0.26–1.65)
Lambda free light chains: 5.7 mg/L (ref 5.7–26.3)

## 2022-11-08 ENCOUNTER — Telehealth: Payer: Self-pay

## 2022-11-08 ENCOUNTER — Other Ambulatory Visit: Payer: Self-pay

## 2022-11-08 NOTE — Telephone Encounter (Signed)
[employer]ukemia [employer] Lymphoma [employer]: PFAP diagnosis verification form completed and faxed to [phone]

## 2022-11-11 ENCOUNTER — Other Ambulatory Visit: Payer: Medicare Other

## 2022-11-11 DIAGNOSIS — R972 Elevated prostate specific antigen [PSA]: Secondary | ICD-10-CM

## 2022-11-11 LAB — MULTIPLE MYELOMA PANEL, SERUM
Albumin SerPl Elph-Mcnc: 4.2 g/dL (ref 2.9–4.4)
Albumin/Glob SerPl: 2.1 — ABNORMAL HIGH (ref 0.7–1.7)
Alpha 1: 0.2 g/dL (ref 0.0–0.4)
Alpha2 Glob SerPl Elph-Mcnc: 0.6 g/dL (ref 0.4–1.0)
B-Globulin SerPl Elph-Mcnc: 0.8 g/dL (ref 0.7–1.3)
Gamma Glob SerPl Elph-Mcnc: 0.4 g/dL (ref 0.4–1.8)
Globulin, Total: 2.1 g/dL — ABNORMAL LOW (ref 2.2–3.9)
IgA: 83 mg/dL (ref 61–437)
IgG (Immunoglobin G), Serum: 442 mg/dL — ABNORMAL LOW (ref 603–1613)
IgM (Immunoglobulin M), Srm: 9 mg/dL — ABNORMAL LOW (ref 20–172)
Total Protein ELP: 6.3 g/dL (ref 6.0–8.5)

## 2022-11-12 ENCOUNTER — Ambulatory Visit: Payer: Medicare Other | Admitting: Oncology

## 2022-11-12 ENCOUNTER — Ambulatory Visit: Payer: Medicare Other

## 2022-11-12 ENCOUNTER — Other Ambulatory Visit: Payer: Medicare Other

## 2022-11-12 LAB — PSA: Prostate Specific Ag, Serum: 5 ng/mL — ABNORMAL HIGH (ref 0.0–4.0)

## 2022-11-13 ENCOUNTER — Other Ambulatory Visit: Payer: Self-pay | Admitting: Oncology

## 2022-11-13 ENCOUNTER — Telehealth: Payer: Self-pay | Admitting: *Deleted

## 2022-11-13 ENCOUNTER — Other Ambulatory Visit: Payer: Self-pay

## 2022-11-13 DIAGNOSIS — D696 Thrombocytopenia, unspecified: Secondary | ICD-10-CM

## 2022-11-13 DIAGNOSIS — N179 Acute kidney failure, unspecified: Secondary | ICD-10-CM

## 2022-11-13 DIAGNOSIS — R5383 Other fatigue: Secondary | ICD-10-CM

## 2022-11-13 DIAGNOSIS — C7951 Secondary malignant neoplasm of bone: Secondary | ICD-10-CM

## 2022-11-13 DIAGNOSIS — C9000 Multiple myeloma not having achieved remission: Secondary | ICD-10-CM

## 2022-11-13 DIAGNOSIS — N1831 Chronic kidney disease, stage 3a: Secondary | ICD-10-CM

## 2022-11-13 NOTE — Telephone Encounter (Signed)
Please contact pt to set up lab appt.

## 2022-11-13 NOTE — Telephone Encounter (Signed)
Call returned to patient and he would like to come in for lab draw before his next appointment. Please enter orders for it and have scheduler to call patient with appointment

## 2022-11-13 NOTE — Telephone Encounter (Signed)
Per Dr. Tasia Catchings, IgG level is still over 400 so he does not need an IVIG infusion. Dr. Tasia Catchings will check Thyroid level at next visit, to see if that could be the cause of low energy levels. If he would like to come in sooner for the thyroid level check that would be ok.

## 2022-11-13 NOTE — Telephone Encounter (Signed)
Patient called reporting that his IGG has dropped more and that he has no energy. He is asking if he needs an infusion and if the IGG dropping would cause his decreased energy level. Please advise.  Component Ref Range & Units 7 d ago 1 mo ago 2 mo ago 3 mo ago 5 mo ago 6 mo ago 7 mo ago  IgG (Immunoglobin G), Serum 603 - 1,613 mg/dL 442 Low  470 Low  585 Low  621 471 Low  494 Low  535 Low   IgA 61 - 437 mg/dL 83 109 98 69 78 82 86  IgM (Immunoglobulin M), Srm 20 - 172 mg/dL 9 Low  9 Low  CM 13 Low  CM 6 Low  CM 6 Low  CM 7 Low  CM 7 Low  CM  Comment: Result confirmed on concentration.  Total Protein ELP 6.0 - 8.5 g/dL 6.3 VC 6.2 VC 6.6 VC 6.4 VC 6.4 VC 6.2 VC 6.2 VC  Albumin SerPl Elph-Mcnc 2.9 - 4.4 g/dL 4.2 VC 4.2 VC 4.3 VC 3.9 VC 4.1 VC 3.9 VC 3.9 VC  Alpha 1 0.0 - 0.4 g/dL 0.2 VC 0.2 VC 0.2 VC 0.3 VC 0.2 VC 0.2 VC 0.2 VC  Alpha2 Glob SerPl Elph-Mcnc 0.4 - 1.0 g/dL 0.6 VC 0.6 VC 0.7 VC 0.7 VC 0.7 VC 0.7 VC 0.7 VC  B-Globulin SerPl Elph-Mcnc 0.7 - 1.3 g/dL 0.8 VC 0.8 VC 0.9 VC 0.9 VC 0.9 VC 0.9 VC 0.9 VC  Gamma Glob SerPl Elph-Mcnc 0.4 - 1.8 g/dL 0.4 VC 0.3 Low  VC 0.5 VC 0.6 VC 0.5 VC 0.4 VC 0.5 VC  M Protein SerPl Elph-Mcnc Not Observed g/dL Not Observed VC Not Observed VC Not Observed VC Not Observed VC Not Observed VC Not Observed VC Not Observed VC  Globulin, Total 2.2 - 3.9 g/dL 2.1 Low  VC 2.0 Low  VC 2.3 VC 2.5 VC 2.3 VC 2.3 VC 2.3 VC  Albumin/Glob SerPl 0.7 - 1.7 2.1 High  VC 2.2 High  VC 1.9 High  VC 1.6 VC 1.8 High  VC 1.7 VC 1.7 VC  IFE 1  Comment Abnormal  VC Comment VC, CM Comment VC, CM Comment Abnormal  VC, CM Comment Abnormal  VC, CM Comment Abnormal  VC, CM Comment Abnormal  VC, CM  Comment: (NOTE) All immunoglobulins appear decreased. Pattern suggestive of hypogammaglobulinemia.  Please Note  Comment VC Comment VC, CM Comment VC, CM Comment VC, CM Comment VC, CM Comment VC, CM Comment VC, CM  Comment: (NOTE) Protein electrophoresis scan will follow via computer, mail,  or courier delivery. Performed At: [facility] [address] [place], [place] [id number] Rush Farmer MD [id number]  Resulting Agency  [facility] [place] [facility] [place] [facility] [place] [facility] [place] [facility]         Specimen Collected: 11/06/22 08:49 Last Resulted: 11/11/22 12:36

## 2022-11-18 ENCOUNTER — Inpatient Hospital Stay: Payer: Medicare Other | Attending: Oncology

## 2022-11-18 DIAGNOSIS — N1831 Chronic kidney disease, stage 3a: Secondary | ICD-10-CM | POA: Insufficient documentation

## 2022-11-18 DIAGNOSIS — Z9484 Stem cells transplant status: Secondary | ICD-10-CM | POA: Diagnosis not present

## 2022-11-18 DIAGNOSIS — C9000 Multiple myeloma not having achieved remission: Secondary | ICD-10-CM | POA: Diagnosis not present

## 2022-11-18 DIAGNOSIS — Z9481 Bone marrow transplant status: Secondary | ICD-10-CM | POA: Diagnosis not present

## 2022-11-18 DIAGNOSIS — D696 Thrombocytopenia, unspecified: Secondary | ICD-10-CM | POA: Insufficient documentation

## 2022-11-18 DIAGNOSIS — I129 Hypertensive chronic kidney disease with stage 1 through stage 4 chronic kidney disease, or unspecified chronic kidney disease: Secondary | ICD-10-CM | POA: Diagnosis not present

## 2022-11-18 DIAGNOSIS — R5383 Other fatigue: Secondary | ICD-10-CM

## 2022-11-18 DIAGNOSIS — Z5112 Encounter for antineoplastic immunotherapy: Secondary | ICD-10-CM | POA: Insufficient documentation

## 2022-11-18 DIAGNOSIS — Z79899 Other long term (current) drug therapy: Secondary | ICD-10-CM | POA: Insufficient documentation

## 2022-11-18 LAB — VITAMIN B12: Vitamin B-12: 1099 pg/mL — ABNORMAL HIGH (ref 180–914)

## 2022-11-18 LAB — T4, FREE: Free T4: 0.79 ng/dL (ref 0.61–1.12)

## 2022-11-18 LAB — TSH: TSH: 1.799 u[IU]/mL (ref 0.350–4.500)

## 2022-11-20 ENCOUNTER — Ambulatory Visit: Payer: 59 | Admitting: Urology

## 2022-11-20 LAB — MISC LABCORP TEST (SEND OUT): Labcorp test code: 81950

## 2022-11-21 ENCOUNTER — Other Ambulatory Visit: Payer: Self-pay | Admitting: Oncology

## 2022-11-26 ENCOUNTER — Encounter: Payer: Self-pay | Admitting: Urology

## 2022-11-26 ENCOUNTER — Ambulatory Visit (INDEPENDENT_AMBULATORY_CARE_PROVIDER_SITE_OTHER): Payer: Medicare Other | Admitting: Urology

## 2022-11-26 VITALS — BP 125/84 | HR 72 | Ht 70.0 in | Wt 205.0 lb

## 2022-11-26 DIAGNOSIS — R972 Elevated prostate specific antigen [PSA]: Secondary | ICD-10-CM | POA: Diagnosis not present

## 2022-11-26 NOTE — Progress Notes (Signed)
I,Amy L Pierron,acting as a scribe for Hollice Espy, MD.,have documented all relevant documentation on the behalf of Hollice Espy, MD,as directed by  Hollice Espy, MD while in the presence of Hollice Espy, MD.   11/26/22 9:57 AM   Luis Burke [birth year] [id number]  Referring provider: Tonia Ghent, MD [place],  [place] [address]  Chief Complaint  Patient presents with   Elevated PSA    HPI: 67 year-old male with personal history of elavated PSA and erectile dysfunction returns today for an annual follow-up.   He has a personal history of multiple medical comorbidities, including multiple myeloma for which he's followed by Dr. Tasia Catchings. He's currently on Daratumumab maintenance for this, along with Xgeva monthly. His most recent PSA is 5.0.  PSA trend:  07/26/2021        11.93 08/07/2021         5.9 11/06/2021           5.2 05/17/2022             4.7 11/11/2022           5.0  He is doing well overall other than an occasional weak stream. He mentioned having a family history (father) of prostate cancer.    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   Fatty liver    on u/s 07/2018   Foot fracture    Hyperlipidemia 03/2004   Hypertension 1994   Multiple myeloma ([place])    Snores     Surgical History: Past Surgical History:  Procedure Laterality Date   CATARACT EXTRACTION Left 10/10/2021   CATARACT EXTRACTION W/PHACO Right 03/04/2018   Procedure: CATARACT EXTRACTION PHACO AND INTRAOCULAR LENS PLACEMENT ([place])  RIGHT TORIC;  Surgeon: Leandrew Koyanagi, MD;  Location: [facility];  Service: Ophthalmology;  Laterality: Right;  [place] no Toric Lens 1:45 5.3   CATARACT EXTRACTION W/PHACO Left 10/10/2021   Procedure: CATARACT EXTRACTION PHACO AND INTRAOCULAR LENS PLACEMENT ([place]) LEFT;   Surgeon: Leandrew Koyanagi, MD;  Location: [facility];  Service: Ophthalmology;  Laterality: Left;  5.16 1:03.8   COLONOSCOPY WITH PROPOFOL N/A 07/20/2018   Procedure: COLONOSCOPY WITH PROPOFOL;  Surgeon: Jonathon Bellows, MD;  Location[facility];  Service: Gastroenterology;  Laterality: N/A;   ESOPHAGOGASTRODUODENOSCOPY (EGD) WITH PROPOFOL N/A 07/20/2018   Procedure: ESOPHAGOGASTRODUODENOSCOPY (EGD) WITH PROPOFOL;  Surgeon: Jonathon Bellows, MD;  Location: [facility] ENDOSCOPY;  Service: Gastroenterology;  Laterality: N/A;   EYE SURGERY Right    HERNIA REPAIR     L Lap herniorraphy  04/2000   Left wrist ganglionectomy      Home Medications:  Allergies as of 11/26/2022       Reactions   Bactrim [sulfamethoxazole-trimethoprim]    Elevated creatinine   Clonidine Derivatives    Low platelets, edema   Tadalafil    Intolerant,  Didn't feel well on med        Medication List        Accurate as of November 26, 2022  9:57 AM. If you have any questions, ask your nurse or doctor.          STOP taking these medications    chlorpheniramine-HYDROcodone 10-8 MG/5ML Commonly known as: TUSSIONEX Stopped by: Hollice Espy, MD   ondansetron 8 MG tablet Commonly known as: ZOFRAN Stopped  by: Hollice Espy, MD   predniSONE 20 MG tablet Commonly known as: DELTASONE Stopped by: Hollice Espy, MD       TAKE these medications    acetaminophen 325 MG tablet Commonly known as: TYLENOL Take 2 tablets (650 mg total) by mouth See admin instructions. 1 hour prior to chemotherapy treatments   acyclovir 400 MG tablet Commonly known as: ZOVIRAX TAKE 1 TABLET(400 MG) BY MOUTH TWICE DAILY   amLODipine 5 MG tablet Commonly known as: NORVASC Take 2 tablets (10 mg total) by mouth daily.   ASPIRIN 81 PO Take 81 mg by mouth daily.   co-enzyme Q-10 50 MG capsule Take 50 mg by mouth daily.   [employer] VITAMIN B12 1000 MCG tablet Generic drug: cyanocobalamin TAKE 1 TABLET BY MOUTH EVERY  DAY   dexamethasone 4 MG tablet Commonly known as: DECADRON Take 5 tablets (20 mg total) by mouth as directed. Take one hour before monthly Darzalex injection.   diphenhydrAMINE 50 MG tablet Commonly known as: BENADRYL Take 1 tablet (50 mg total) by mouth See admin instructions. Take 1 tablet 1 hour prior to chemotherapy treatments.   hydrALAZINE 10 MG tablet Commonly known as: APRESOLINE TAKE 1 TABLET(10 MG) BY MOUTH THREE TIMES DAILY   lisinopril 20 MG tablet Commonly known as: ZESTRIL Take 1 tablet (20 mg total) by mouth daily.   magnesium chloride 64 MG Tbec SR tablet Commonly known as: SLOW-MAG Take 1 tablet (64 mg total) by mouth daily.   metoprolol succinate 50 MG 24 hr tablet Commonly known as: TOPROL-XL TAKE 4 TABLETS BY MOUTH EVERY DAY WITH OR IMMEDIATELY FOLLOWING A MEAL   montelukast 10 MG tablet Commonly known as: Singulair Take 1 tablet (10 mg total) by mouth See admin instructions. Take 1day prior to chemotherapy   pravastatin 10 MG tablet Commonly known as: PRAVACHOL Take 1 tablet (10 mg total) by mouth daily.   prochlorperazine 10 MG tablet Commonly known as: COMPAZINE   sildenafil 20 MG tablet Commonly known as: REVATIO TAKE 1-5 TABLETS (20-100 MG TOTAL) BY MOUTH DAILY AS NEEDED   VITAMIN B COMPLEX PO Take 1 Dose by mouth daily.   XGEVA [place] Inject into the skin every 30 (thirty) days. Last rcvd 09/27/21   zinc gluconate 50 MG tablet Take 50 mg by mouth daily.        Allergies:  Allergies  Allergen Reactions   Bactrim [Sulfamethoxazole-Trimethoprim]     Elevated creatinine   Clonidine Derivatives     Low platelets, edema   Tadalafil     Intolerant,  Didn't feel well on med    Family History: Family History  Problem Relation Age of Onset   Hypertension Mother    Diabetes Mother    Heart disease Mother        CAD   Dementia Mother    Prostate cancer Father    Hypertension Father    Heart disease Father        MI 02/03   Colon  cancer Father    Hypertension Sister    Hypertension Sister    Hypertension Sister     Social History:  reports that he has never smoked. He has never used smokeless tobacco. He reports that he does not currently use alcohol after a past usage of about 3.0 standard drinks of alcohol per week. He reports that he does not use drugs.   Physical Exam: BP 125/84   Pulse 72   Ht 5' 10"$  (1.778 m)   Wt 205  lb (93 kg)   BMI 29.41 kg/m   Constitutional:  Alert and oriented, No acute distress. HEENT: [place] AT, moist mucus membranes.  Trachea midline, no masses. GU: Normal sphincter tone, enlarged prostate more than 50 grams, non tender, no nodules. Neurologic: Grossly intact, no focal deficits, moving all 4 extremities. Psychiatric: Normal mood and affect.   Assessment & Plan:    Elevated PSA  - Elevated but reasonably stable. In light of his other comorbidities, would recommend a more conservative approach.  -No significant urinary symptoms other than an occasional weak stream.   Return in about 1 year (around 11/27/2023) for PSA and DRE.  I have reviewed the above documentation for accuracy and completeness, and I agree with the above.   Hollice Espy, MD   [facility] [address] [place], [place] [address] [phone]

## 2022-11-27 ENCOUNTER — Other Ambulatory Visit: Payer: Self-pay

## 2022-12-04 ENCOUNTER — Ambulatory Visit: Payer: Medicare Other | Admitting: Oncology

## 2022-12-04 ENCOUNTER — Other Ambulatory Visit: Payer: Medicare Other

## 2022-12-04 ENCOUNTER — Ambulatory Visit: Payer: Medicare Other

## 2022-12-09 ENCOUNTER — Inpatient Hospital Stay (HOSPITAL_BASED_OUTPATIENT_CLINIC_OR_DEPARTMENT_OTHER): Payer: Medicare Other | Admitting: Oncology

## 2022-12-09 ENCOUNTER — Encounter: Payer: Self-pay | Admitting: Oncology

## 2022-12-09 ENCOUNTER — Inpatient Hospital Stay: Payer: Medicare Other

## 2022-12-09 VITALS — BP 153/92 | HR 65 | Temp 96.4°F | Wt 209.0 lb

## 2022-12-09 DIAGNOSIS — Z5111 Encounter for antineoplastic chemotherapy: Secondary | ICD-10-CM | POA: Diagnosis not present

## 2022-12-09 DIAGNOSIS — C9000 Multiple myeloma not having achieved remission: Secondary | ICD-10-CM

## 2022-12-09 DIAGNOSIS — Z5112 Encounter for antineoplastic immunotherapy: Secondary | ICD-10-CM | POA: Diagnosis not present

## 2022-12-09 DIAGNOSIS — D696 Thrombocytopenia, unspecified: Secondary | ICD-10-CM

## 2022-12-09 DIAGNOSIS — R771 Abnormality of globulin: Secondary | ICD-10-CM

## 2022-12-09 DIAGNOSIS — C7951 Secondary malignant neoplasm of bone: Secondary | ICD-10-CM

## 2022-12-09 DIAGNOSIS — N1831 Chronic kidney disease, stage 3a: Secondary | ICD-10-CM

## 2022-12-09 DIAGNOSIS — N179 Acute kidney failure, unspecified: Secondary | ICD-10-CM

## 2022-12-09 LAB — COMPREHENSIVE METABOLIC PANEL
ALT: 18 U/L (ref 0–44)
AST: 24 U/L (ref 15–41)
Albumin: 4.5 g/dL (ref 3.5–5.0)
Alkaline Phosphatase: 70 U/L (ref 38–126)
Anion gap: 12 (ref 5–15)
BUN: 18 mg/dL (ref 8–23)
CO2: 25 mmol/L (ref 22–32)
Calcium: 9.6 mg/dL (ref 8.9–10.3)
Chloride: 101 mmol/L (ref 98–111)
Creatinine, Ser: 1.37 mg/dL — ABNORMAL HIGH (ref 0.61–1.24)
GFR, Estimated: 57 mL/min — ABNORMAL LOW (ref >60)
Glucose, Bld: 95 mg/dL (ref 70–99)
Potassium: 3.8 mmol/L (ref 3.5–5.1)
Sodium: 138 mmol/L (ref 135–145)
Total Bilirubin: 0.8 mg/dL (ref 0.3–1.2)
Total Protein: 7 g/dL (ref 6.5–8.1)

## 2022-12-09 LAB — CBC WITH DIFFERENTIAL/PLATELET
Abs Immature Granulocytes: 0.02 10*3/uL (ref 0.00–0.07)
Basophils Absolute: 0 10*3/uL (ref 0.0–0.1)
Basophils Relative: 0 %
Eosinophils Absolute: 0 10*3/uL (ref 0.0–0.5)
Eosinophils Relative: 1 %
HCT: 41.2 % (ref 39.0–52.0)
Hemoglobin: 14.1 g/dL (ref 13.0–17.0)
Immature Granulocytes: 0 %
Lymphocytes Relative: 25 %
Lymphs Abs: 1.7 10*3/uL (ref 0.7–4.0)
MCH: 34.1 pg — ABNORMAL HIGH (ref 26.0–34.0)
MCHC: 34.2 g/dL (ref 30.0–36.0)
MCV: 99.8 fL (ref 80.0–100.0)
Monocytes Absolute: 0.5 10*3/uL (ref 0.1–1.0)
Monocytes Relative: 8 %
Neutro Abs: 4.5 10*3/uL (ref 1.7–7.7)
Neutrophils Relative %: 66 %
Platelets: 118 10*3/uL — ABNORMAL LOW (ref 150–400)
RBC: 4.13 MIL/uL — ABNORMAL LOW (ref 4.22–5.81)
RDW: 12.8 % (ref 11.5–15.5)
WBC: 6.8 10*3/uL (ref 4.0–10.5)
nRBC: 0 % (ref 0.0–0.2)

## 2022-12-09 MED ORDER — DENOSUMAB 120 MG/1.7ML ~~LOC~~ SOLN
120.0000 mg | Freq: Once | SUBCUTANEOUS | Status: AC
  Administered 2022-12-09: 120 mg via SUBCUTANEOUS
  Filled 2022-12-09: qty 1.7

## 2022-12-09 MED ORDER — DIPHENHYDRAMINE HCL 25 MG PO CAPS: 50.0000 mg | ORAL_CAPSULE | Freq: Once | ORAL | Status: DC

## 2022-12-09 MED ORDER — DARATUMUMAB-HYALURONIDASE-FIHJ 1800-30000 MG-UT/15ML ~~LOC~~ SOLN
1800.0000 mg | Freq: Once | SUBCUTANEOUS | Status: AC
  Administered 2022-12-09: 1800 mg via SUBCUTANEOUS
  Filled 2022-12-09: qty 15

## 2022-12-09 NOTE — Assessment & Plan Note (Signed)
Majority of his lucencies were not hypermetabolic on previous PET scan Xgeva monthly- Continue calcium and vitamin D supplementation.

## 2022-12-09 NOTE — Assessment & Plan Note (Signed)
Treatment plan as listed above

## 2022-12-09 NOTE — Assessment & Plan Note (Signed)
IVIG if IgG <400

## 2022-12-09 NOTE — Patient Instructions (Signed)
[facility]  Discharge Instructions: Thank you for choosing [facility] to provide your oncology and hematology care.  If you have a lab appointment with the [facility], please go directly to the [facility] and check in at the registration area.  Wear comfortable clothing and clothing appropriate for easy access to any Portacath or PICC line.   We strive to give you quality time with your provider. You may need to reschedule your appointment if you arrive late (15 or more minutes).  Arriving late affects you and other patients whose appointments are after yours.  Also, if you miss three or more appointments without notifying the office, you may be dismissed from the clinic at the provider's discretion.      For prescription refill requests, have your pharmacy contact our office and allow 72 hours for refills to be completed.    Today you received the following chemotherapy and/or immunotherapy agents- daratumumab      To help prevent nausea and vomiting after your treatment, we encourage you to take your nausea medication as directed.  BELOW ARE SYMPTOMS THAT SHOULD BE REPORTED IMMEDIATELY: *FEVER GREATER THAN 100.4 F (38 C) OR HIGHER *CHILLS OR SWEATING *NAUSEA AND VOMITING THAT IS NOT CONTROLLED WITH YOUR NAUSEA MEDICATION *UNUSUAL SHORTNESS OF BREATH *UNUSUAL BRUISING OR BLEEDING *URINARY PROBLEMS (pain or burning when urinating, or frequent urination) *BOWEL PROBLEMS (unusual diarrhea, constipation, pain near the anus) TENDERNESS IN MOUTH AND THROAT WITH OR WITHOUT PRESENCE OF ULCERS (sore throat, sores in mouth, or a toothache) UNUSUAL RASH, SWELLING OR PAIN  UNUSUAL VAGINAL DISCHARGE OR ITCHING   Items with * indicate a potential emergency and should be followed up as soon as possible or go to the Emergency Department if any problems should occur.  Please show the CHEMOTHERAPY ALERT CARD or IMMUNOTHERAPY ALERT CARD at check-in  to the Emergency Department and triage nurse.  Should you have questions after your visit or need to cancel or reschedule your appointment, please contact [facility]  [phone] and follow the prompts.  Office hours are 8:00 a.m. to 4:30 p.m. Monday - Friday. Please note that voicemails left after 4:00 p.m. may not be returned until the following business day.  We are closed weekends and major holidays. You have access to a nurse at all times for urgent questions. Please call the main number to the clinic [phone] and follow the prompts.  For any non-urgent questions, you may also contact your provider using MyChart. We now offer e-Visits for anyone 5 and older to request care online for non-urgent symptoms. For details visit [email or web address].   Also download the MyChart app! Go to the app store, search "MyChart", open the app, select [facility], and log in with your MyChart username and password.

## 2022-12-09 NOTE — Assessment & Plan Note (Addendum)
Kappa Light chain multiple myeloma beta 2 microglobulin 5 and normal albumin.  Stage II.cytogenetics showed normal male chromosome, MDS FISH panel showed trisomy 64- Standard Risk.S/p RVD x 9 and  Autologous bone marrow stem cell transplant at [facility] on 04/23/2019.-->Early relapse multiple myeloma.  06/04/2021 5% plasma cells -August 2022-February 2023, patient completed 7 cycles of daratumumab/Pomalyst/dexamethasone. 11/20/2021, status post bone marrow biopsy which showed 1% of plasma cells.  Likely he has minimal residual disease. Currently on daratumumab maintenance. He takes his supply of dexamethasone '20mg'$  prior to his treatment.  Labs are reviewed and discussed with patient. light chain ratio stable Proceed with daratumumab today .  Obtain 24 hour UPEP

## 2022-12-09 NOTE — Assessment & Plan Note (Signed)
Stable.  Monitor counts. 

## 2022-12-09 NOTE — Assessment & Plan Note (Signed)
Avoid nephrotoxin.  Encourage hydration.

## 2022-12-09 NOTE — Progress Notes (Signed)
Hematology/Oncology Progress note [phone] [phone]      REASON FOR VISIT Follow up for multiple myeloma.   ASSESSMENT & PLAN:   Multiple myeloma (HCC) Kappa Light chain multiple myeloma beta 2 microglobulin 5 and normal albumin.  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  06/04/2021 5% plasma cells -August 2022-February 2023, patient completed 7 cycles of daratumumab/Pomalyst/dexamethasone. 11/20/2021, status post bone marrow biopsy which showed 1% of plasma cells.  Likely he has minimal residual disease. Currently on daratumumab maintenance. He takes his supply of dexamethasone '20mg'$  prior to his treatment.  Labs are reviewed and discussed with patient. light chain ratio stable Proceed with daratumumab today .  Obtain 24 hour UPEP  Metastasis to bone ([place]) Majority of his lucencies were not hypermetabolic on previous PET scan Xgeva monthly- Continue calcium and vitamin D supplementation.   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  Encourage hydration.  Thrombocytopenia (HCC) Stable. Monitor counts.   Handicap parking application signed and provided to patient.   Orders Placed This Encounter  Procedures   IFE+PROTEIN ELECTRO, 24-HR UR    Standing Status:   Future    Standing Expiration Date:   12/10/2023    Follow up in 4 weeks, Lab MD Dara  All questions were answered. The patient knows to call the clinic with any problems, questions or concerns.  Earlie Server, MD, PhD [facility] Hematology Oncology 12/09/2022   HISTORY OF PRESENTING ILLNESS:  Luis Burke is a  67 y.o.  male with PMH listed below presents for follow up of multiple myeloma.   Oncology History  Multiple myeloma ([place])  08/19/2018 Bone Marrow Biopsy   08/19/2018 bone marrow biopsy showed hypercellular marrow 80%, involved by plasma cell neoplasm up to 95%.  Consistent with plasma cell myeloma. Myeloma FISH  gain of gain of CEP12   08/25/2018 Imaging   PET scan showed no definite hypermetabolic bone disease but CT findings are highly suspicious for numerous small myelomatous lesions involving the spine, sternum and scattered ribs.   08/26/2018 Initial Diagnosis   Multiple myeloma  # 07/27/2018 multiple myeloma panel showed M protein of 0.1, IgG 662, IgA 49, IgM 7. 08/10/2018, free light chain ratio showed extremely high level of kappa free light chain 10,183, with a kappa lambda light chain ratio of 1414.31,LDH 164, Beta-2 microglobulin 5     08/28/2018 - 03/17/2019 Chemotherapy   RVD x 9     04/23/2019 Bone Marrow Transplant   autologous stem cell bone marrow transplant at [facility].  He received preparative regimen with melphalan 200 mg/m on 04/22/2019 followed by autologous stem cell infusion on 04/23/2019. Transplant course was complicated with febrile neutropenia grade 3, treated with vancomycin and cephapirin until engraftment on 05/06/2019. Patient also had engraftment syndrome and had to be started on Solu-Medrol 25 mg twice daily on 05/05/2019.  Transition to Medrol Dosepak on 05/07/2019 to complete steroid taper as outpatient.  re-vaccination 12 months post transplant   08/10/2019 - 05/2022 Chemotherapy    Ixazomib '3mg'$  on D1/D8/D15 out of 28 day cycle       07/12/2020 Imaging   CT skeletal survey done at [facility] 1.  Lucent lesion in the L4 spinous process measuring 4 mm which is  nonspecific.  Consider confirmation of marrow replacing process with MRI.  2.  Incompletely characterized renal cysts    07/28/2020 Bone Marrow Biopsy    bone marrow normocellular marrow with polytypic plasmacytosis, 3 to 8%   Patient is in remission.  Cytogenetics negative for trisomy  12.  Negative myeloma FISH panel.   08/05/2020 Imaging   MRI lumbar spine with and without contrast was done at [facility] showed no evidence of spinal metastasis.  Lumbar spondylosis is most pronounced at L5-S1 where there is severe right and moderate left neural foraminal stenosis. 08/11/2020, x-ray cervical spine complete with flexion and extension showed The cervical spine is visualized from C1 to the top of T1 on the lateral  view.  Reversal of the normal cervical lordosis with a focal kyphosis centered at the C5-C6 intervertebral level. Trace anterolisthesis of C4 onto C5. Trace retrolisthesis of C6 on C7. No evidence of dynamic instability is noted on  the flexion or extension views.   No prevertebral soft tissue swelling. Cervical vertebral body heights are maintained. Multilevel degenerative changes of the cervical spine including discogenic disease, most pronounced at C5-C6 and C6-C7, and facet arthropathy most pronounced at C4-C5. Multilevel mild to moderate left neural foraminal osseous narrowing of the mid to lower cervical vertebrae, possibly centrally/artifactual by patient positioning.     05/15/2021 Bone Marrow Biopsy   Bone marrow biopsy showed slightly hypercellular bone marrow for age with slight plasmacytosis.  5% plasma cells with kappa light chain restriction.  Cytogenetics normal.  Myeloma FISH negative I discussed with Luis Burke. we both feel that his bone marrow biopsy result reflects early relapse. Recommend stop Ixazomib. Switch to second line treatment with Daratumumab Pomlyst Dexamethasone   05/31/2021 - 05/20/2022 Chemotherapy   Patient is on Treatment Plan : MYELOMA Daratumumab + Pomalidomide + Dexamethasone q28d x 7 cycles     05/31/2021 -  Chemotherapy   Patient is on Treatment Plan : MYELOMA Daratumumab IV + Pomalidomide + Dexamethasone q28d x 7 cycles     09/21/2021 Imaging    PET scan showed no evidence of suspicious or new hypermetabolism, scattered small  lucent foci seen previously in the sternum and the spine are less prominent today and have resolved in some regions.  Aortic atherosclerosis.   11/18/2021 Imaging   MRI cervical spine with and without contrast showed no multiple myeloma lesion or marrow edema identified in the cervical spine.  Abnormal dural thickening and a/or abnormal epidural space at C4/C5 levels.  This is nonspecific.  ER physician discussed the case with neurosurgery Dr. Cari Caraway who feels that this is more likely degenerative disc disease.   11/20/2021 Bone Marrow Biopsy   bone marrow biopsy which showed 1% of plasma cells. The plasma cells generally display polyclonal staining pattern for kappa and lambda light chains although a few very small clusters appear kappa light chain restricted.  The findings are very limited but most suggestive of minimal residual plasma cell neoplasm especially in the presence of monoclonal protein with kappa light chain specificity. Normal cytogenetics and myeloma FISH   12/20/2021 -  Chemotherapy   Daratumumab monthly maintenance  # I discussed with patient's [facility] oncologist Dr. Maylene Roes.  We have agreed upon de-escalating his regimen to Daratumumab monthly maintenance for now. Discontinue pomalyst.     Metastasis to bone [facility])   September -Nov 2023, patient has had recurrent upper respiratory infection/bronchitis/sinusitis. Patient was evaluated by ENT, he was diagnosed with allergic rhinitis, nasal congestion is due to allergy and to hypertrophy.  Chronic sinusitis, finished a course of clindamycin and a prednisone taper.   INTERVAL HISTORY Luis Burke is a 67 y.o. male who has above history reviewed by me today for follow-up multiple myeloma.  Denies any nausea vomiting diarrhea today Sometimes, he has difficulty ambulate 218f due to SOB with exertion.  Patient has no new complaints.  Review of Systems  Constitutional:  Negative for appetite change, chills, fatigue, fever and  unexpected weight change.  HENT:   Negative for hearing loss.   Eyes:  Negative for eye problems and icterus.  Respiratory:  Negative for chest tightness, cough and shortness of breath.   Cardiovascular:  Negative for chest pain and leg swelling.  Gastrointestinal:  Negative for abdominal distention and abdominal pain.  Endocrine: Negative for hot flashes.  Genitourinary:  Negative for difficulty urinating, dysuria and frequency.   Musculoskeletal:  Positive for back pain. Negative for arthralgias.  Skin:  Negative for itching and rash.  Neurological:  Negative for light-headedness and numbness.  Hematological:  Negative for adenopathy. Does not bruise/bleed easily.  Psychiatric/Behavioral:  Negative for confusion.     MEDICAL HISTORY:  Past Medical History:  Diagnosis Date   AKI (acute kidney injury) ([place] 01/06/2019   Anemia    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[place]    autologous stem cell bone marrow transplant on 04/23/2019.   Fatty liver    on u/s 07/2018   Foot fracture    Hyperlipidemia 03/2004   Hypertension 1994   Multiple myeloma ([place]    Snores     SURGICAL HISTORY: Past Surgical History:  Procedure Laterality Date   CATARACT EXTRACTION Left 10/10/2021   CATARACT EXTRACTION W/PHACO Right 03/04/2018   Procedure: CATARACT EXTRACTION PHACO AND INTRAOCULAR LENS PLACEMENT ([place]  RIGHT TORIC;  Surgeon: BLeandrew Koyanagi MD;  [facility]  Service: Ophthalmology;  Laterality: Right;  Per [place] no Toric Lens 1:45 5.3   CATARACT EXTRACTION W/PHACO Left 10/10/2021   Procedure: CATARACT EXTRACTION PHACO AND INTRAOCULAR LENS PLACEMENT ([place] LEFT;  Surgeon: BLeandrew Koyanagi MD;  [place] [facility]  Service: Ophthalmology;  Laterality: Left;  5.16 1:03.8   COLONOSCOPY WITH PROPOFOL N/A 07/20/2018   Procedure: COLONOSCOPY WITH PROPOFOL;  Surgeon: AJonathon Bellows MD;  [facility]  Service: Gastroenterology;  Laterality: N/A;    ESOPHAGOGASTRODUODENOSCOPY (EGD) WITH PROPOFOL N/A 07/20/2018   Procedure: ESOPHAGOGASTRODUODENOSCOPY (EGD) WITH PROPOFOL;  Surgeon: AJonathon Bellows MD;  [facility];  Service: Gastroenterology;  Laterality: N/A;   EYE SURGERY Right    HERNIA REPAIR     L Lap herniorraphy  04/2000   Left wrist ganglionectomy      SOCIAL HISTORY: Social History   Socioeconomic History   Marital status: Married    Spouse name: Not on file   Number of children: 1   Years of education: Not on file   Highest education level: Not on file  Occupational History   Occupation: [employer]  Tobacco Use   Smoking status: Never   Smokeless tobacco: Never   Tobacco comments:    Occassionally  Vaping Use   Vaping Use: Never used  Substance and Sexual Activity   Alcohol use: Not Currently    Alcohol/week: 3.0 standard drinks of alcohol    Types: 3 Cans of beer per week   Drug use: No   Sexual activity: Not on file  Other Topics Concern   Not on file  Social History Narrative   Divorced 2009, married 05/27/21.     His  daughter died 4 days after giving birth to the patient's granddaughter   Has joint custody of his deceased daughter's child   Worked at [employer] is [place], retired 2020.     Social Determinants of Health   Financial Resource Strain: Not on file  Food Insecurity: Not on file  Transportation Needs: Not on file  Physical Activity: Not on file  Stress: Not on file  Social Connections: Not on file  Intimate Partner Violence: Not on file    FAMILY HISTORY: Family History  Problem Relation Age of Onset   Hypertension Mother    Diabetes Mother    Heart disease Mother        CAD   Dementia Mother    Prostate cancer Father    Hypertension Father    Heart disease Father        MI 02/03   Colon cancer Father    Hypertension Sister    Hypertension Sister    Hypertension Sister     ALLERGIES:  is allergic to bactrim [sulfamethoxazole-trimethoprim], clonidine  derivatives, and tadalafil.  MEDICATIONS:  Current Outpatient Medications  Medication Sig Dispense Refill   acetaminophen (TYLENOL) 325 MG tablet Take 2 tablets (650 mg total) by mouth See admin instructions. 1 hour prior to chemotherapy treatments 30 tablet 0   acyclovir (ZOVIRAX) 400 MG tablet TAKE 1 TABLET(400 MG) BY MOUTH TWICE DAILY 60 tablet 2   amLODipine (NORVASC) 5 MG tablet Take 2 tablets (10 mg total) by mouth daily. 180 tablet 3   ASPIRIN 81 PO Take 81 mg by mouth daily.     B Complex Vitamins (VITAMIN B COMPLEX PO) Take 1 Dose by mouth daily.     co-enzyme Q-10 50 MG capsule Take 50 mg by mouth daily.     [employer] VITAMIN B12 1000 MCG tablet TAKE 1 TABLET BY MOUTH EVERY DAY 90 tablet 3   Denosumab (XGEVA [place]) Inject into the skin every 30 (thirty) days. Last rcvd 09/27/21     dexamethasone (DECADRON) 4 MG tablet Take 5 tablets (20 mg total) by mouth as directed. Take one hour before monthly Darzalex injection. 45 tablet 1   diphenhydrAMINE (BENADRYL) 50 MG tablet Take 1 tablet (50 mg total) by mouth See admin instructions. Take 1 tablet 1 hour prior to chemotherapy treatments. 30 tablet 0   hydrALAZINE (APRESOLINE) 10 MG tablet TAKE 1 TABLET(10 MG) BY MOUTH THREE TIMES DAILY 270 tablet 3   lisinopril (ZESTRIL) 20 MG tablet Take 1 tablet (20 mg total) by mouth daily. 90 tablet 3   magnesium chloride (SLOW-MAG) 64 MG TBEC SR tablet Take 1 tablet (64 mg total) by mouth daily. 60 tablet 0   metoprolol succinate (TOPROL-XL) 50 MG 24 hr tablet TAKE 4 TABLETS BY MOUTH EVERY DAY WITH OR IMMEDIATELY FOLLOWING A MEAL 360 tablet 3   montelukast (SINGULAIR) 10 MG tablet Take 1 tablet (10 mg total) by mouth See admin instructions. Take 1day prior to chemotherapy 30 tablet 1   pravastatin (PRAVACHOL) 10 MG tablet Take 1 tablet (10 mg total) by mouth daily. 90 tablet 3   prochlorperazine (COMPAZINE) 10 MG tablet      sildenafil (REVATIO) 20 MG tablet TAKE 1-5 TABLETS (20-100 MG TOTAL) BY MOUTH DAILY AS  NEEDED 12 tablet 5   zinc gluconate 50 MG tablet Take 50 mg by mouth daily.     No current facility-administered medications for this visit.   Facility-Administered Medications Ordered in Other Visits  Medication Dose Route Frequency Provider Last  Rate Last Admin   0.9 %  sodium chloride infusion   Intravenous Once Earlie Server, MD       diphenhydrAMINE (BENADRYL) capsule 50 mg  50 mg Oral Once Earlie Server, MD         PHYSICAL EXAMINATION: ECOG PERFORMANCE STATUS: 1 - Symptomatic but completely ambulatory Vitals:   12/09/22 0909  BP: (!) 153/92  Pulse: 65  Temp: (!) 96.4 F (35.8 C)  SpO2: 100%   Filed Weights   12/09/22 0909  Weight: 209 lb (94.8 kg)    Physical Exam Constitutional:      General: He is not in acute distress. HENT:     Head: Normocephalic and atraumatic.  Eyes:     General: No scleral icterus. Cardiovascular:     Rate and Rhythm: Normal rate and regular rhythm.     Heart sounds: Normal heart sounds.  Pulmonary:     Effort: Pulmonary effort is normal. No respiratory distress.     Breath sounds: No wheezing.  Abdominal:     General: Bowel sounds are normal. There is no distension.     Palpations: Abdomen is soft.  Musculoskeletal:        General: No deformity. Normal range of motion.     Cervical back: Normal range of motion and neck supple.  Skin:    General: Skin is warm and dry.     Findings: No erythema or rash.  Neurological:     Mental Status: He is alert and oriented to person, place, and time. Mental status is at baseline.     Cranial Nerves: No cranial nerve deficit.     Coordination: Coordination normal.  Psychiatric:        Mood and Affect: Mood normal.      LABORATORY DATA:  I have reviewed the data as listed    Latest Ref Rng & Units 12/09/2022    8:59 AM 11/06/2022    8:49 AM 10/09/2022    8:56 AM  CBC  WBC 4.0 - 10.5 K/uL 6.8  5.7  3.8   Hemoglobin 13.0 - 17.0 g/dL 14.1  14.2  13.8   Hematocrit 39.0 - 52.0 % 41.2  41.9  40.4    Platelets 150 - 400 K/uL 118  92  80       Latest Ref Rng & Units 12/09/2022    8:59 AM 11/06/2022    8:49 AM 10/09/2022    8:56 AM  CMP  Glucose 70 - 99 mg/dL 95  100  103   BUN 8 - 23 mg/dL '18  21  14   '$ Creatinine 0.61 - 1.24 mg/dL 1.37  1.26  1.19   Sodium 135 - 145 mmol/L 138  140  141   Potassium 3.5 - 5.1 mmol/L 3.8  4.1  3.8   Chloride 98 - 111 mmol/L 101  104  108   CO2 22 - 32 mmol/L '25  25  25   '$ Calcium 8.9 - 10.3 mg/dL 9.6  9.4  8.8   Total Protein 6.5 - 8.1 g/dL 7.0  6.9  7.0   Total Bilirubin 0.3 - 1.2 mg/dL 0.8  0.6  0.6   Alkaline Phos 38 - 126 U/L 70  61  50   AST 15 - 41 U/L '24  21  21   '$ ALT 0 - 44 U/L '18  17  20      '$ Lab Results  Component Value Date   KPAFRELGTCHN 15.3 11/06/2022   LAMBDASER 5.7 11/06/2022  KAPLAMBRATIO 2.68 (H) 11/06/2022    RADIOGRAPHIC STUDIES: I have personally reviewed the radiological images as listed and agreed with the findings in the report. No results found.

## 2022-12-10 LAB — KAPPA/LAMBDA LIGHT CHAINS
Kappa free light chain: 23.7 mg/L — ABNORMAL HIGH (ref 3.3–19.4)
Kappa, lambda light chain ratio: 1.8 — ABNORMAL HIGH (ref 0.26–1.65)
Lambda free light chains: 13.2 mg/L (ref 5.7–26.3)

## 2022-12-12 ENCOUNTER — Other Ambulatory Visit: Payer: Self-pay

## 2022-12-12 DIAGNOSIS — Z5112 Encounter for antineoplastic immunotherapy: Secondary | ICD-10-CM | POA: Diagnosis not present

## 2022-12-12 DIAGNOSIS — C9000 Multiple myeloma not having achieved remission: Secondary | ICD-10-CM

## 2022-12-16 LAB — MULTIPLE MYELOMA PANEL, SERUM
Albumin SerPl Elph-Mcnc: 4.1 g/dL (ref 2.9–4.4)
Albumin/Glob SerPl: 1.9 — ABNORMAL HIGH (ref 0.7–1.7)
Alpha 1: 0.2 g/dL (ref 0.0–0.4)
Alpha2 Glob SerPl Elph-Mcnc: 0.7 g/dL (ref 0.4–1.0)
B-Globulin SerPl Elph-Mcnc: 0.8 g/dL (ref 0.7–1.3)
Gamma Glob SerPl Elph-Mcnc: 0.5 g/dL (ref 0.4–1.8)
Globulin, Total: 2.2 g/dL (ref 2.2–3.9)
IgA: 73 mg/dL (ref 61–437)
IgG (Immunoglobin G), Serum: 488 mg/dL — ABNORMAL LOW (ref 603–1613)
IgM (Immunoglobulin M), Srm: 37 mg/dL (ref 20–172)
Total Protein ELP: 6.3 g/dL (ref 6.0–8.5)

## 2022-12-17 LAB — IFE+PROTEIN ELECTRO, 24-HR UR
% BETA, Urine: 0 %
ALPHA 1 URINE: 0 %
Albumin, U: 100 %
Alpha 2, Urine: 0 %
GAMMA GLOBULIN URINE: 0 %
Total Protein, Urine-Ur/day: 98 mg/24 hr (ref 30–150)
Total Protein, Urine: 5.5 mg/dL
Total Volume: 1775

## 2022-12-18 ENCOUNTER — Telehealth: Payer: Self-pay

## 2022-12-18 NOTE — Telephone Encounter (Signed)
Patient called in and left a voicemail stating he got his kidney level back and wanted someone to explain them to him

## 2022-12-19 NOTE — Telephone Encounter (Signed)
Called and spoke to pt. Informed him that MM panel is looking better, kidney function is fluctuating but about the same levels as before, not due to progression. Encouraged him to drink more water and stay hydrated. Pt verbalized understanding. No further questions asked.

## 2023-01-06 ENCOUNTER — Inpatient Hospital Stay: Payer: Medicare Other | Attending: Oncology

## 2023-01-06 ENCOUNTER — Other Ambulatory Visit: Payer: Self-pay

## 2023-01-06 ENCOUNTER — Inpatient Hospital Stay: Payer: Medicare Other

## 2023-01-06 ENCOUNTER — Encounter: Payer: Self-pay | Admitting: Oncology

## 2023-01-06 ENCOUNTER — Inpatient Hospital Stay (HOSPITAL_BASED_OUTPATIENT_CLINIC_OR_DEPARTMENT_OTHER): Payer: Medicare Other | Admitting: Oncology

## 2023-01-06 VITALS — BP 147/96 | HR 66 | Temp 97.4°F | Resp 18 | Wt 210.6 lb

## 2023-01-06 DIAGNOSIS — N1831 Chronic kidney disease, stage 3a: Secondary | ICD-10-CM

## 2023-01-06 DIAGNOSIS — R771 Abnormality of globulin: Secondary | ICD-10-CM

## 2023-01-06 DIAGNOSIS — D696 Thrombocytopenia, unspecified: Secondary | ICD-10-CM | POA: Insufficient documentation

## 2023-01-06 DIAGNOSIS — C9000 Multiple myeloma not having achieved remission: Secondary | ICD-10-CM

## 2023-01-06 DIAGNOSIS — Z5111 Encounter for antineoplastic chemotherapy: Secondary | ICD-10-CM

## 2023-01-06 DIAGNOSIS — Z5112 Encounter for antineoplastic immunotherapy: Secondary | ICD-10-CM | POA: Diagnosis not present

## 2023-01-06 DIAGNOSIS — R109 Unspecified abdominal pain: Secondary | ICD-10-CM | POA: Diagnosis not present

## 2023-01-06 DIAGNOSIS — C7951 Secondary malignant neoplasm of bone: Secondary | ICD-10-CM

## 2023-01-06 LAB — URINALYSIS, COMPLETE (UACMP) WITH MICROSCOPIC
Bacteria, UA: NONE SEEN
Bilirubin Urine: NEGATIVE
Glucose, UA: NEGATIVE mg/dL
Hgb urine dipstick: NEGATIVE
Ketones, ur: NEGATIVE mg/dL
Leukocytes,Ua: NEGATIVE
Nitrite: NEGATIVE
Protein, ur: NEGATIVE mg/dL
Specific Gravity, Urine: 1.005 (ref 1.005–1.030)
Squamous Epithelial / HPF: NONE SEEN /HPF (ref 0–5)
WBC, UA: NONE SEEN WBC/hpf (ref 0–5)
pH: 5 (ref 5.0–8.0)

## 2023-01-06 LAB — CBC WITH DIFFERENTIAL/PLATELET
Abs Immature Granulocytes: 0.02 10*3/uL (ref 0.00–0.07)
Basophils Absolute: 0 10*3/uL (ref 0.0–0.1)
Basophils Relative: 0 %
Eosinophils Absolute: 0.1 10*3/uL (ref 0.0–0.5)
Eosinophils Relative: 1 %
HCT: 42.3 % (ref 39.0–52.0)
Hemoglobin: 14.3 g/dL (ref 13.0–17.0)
Immature Granulocytes: 0 %
Lymphocytes Relative: 41 %
Lymphs Abs: 2.4 10*3/uL (ref 0.7–4.0)
MCH: 33.6 pg (ref 26.0–34.0)
MCHC: 33.8 g/dL (ref 30.0–36.0)
MCV: 99.5 fL (ref 80.0–100.0)
Monocytes Absolute: 0.5 10*3/uL (ref 0.1–1.0)
Monocytes Relative: 8 %
Neutro Abs: 2.9 10*3/uL (ref 1.7–7.7)
Neutrophils Relative %: 50 %
Platelets: 100 10*3/uL — ABNORMAL LOW (ref 150–400)
RBC: 4.25 MIL/uL (ref 4.22–5.81)
RDW: 12.9 % (ref 11.5–15.5)
WBC: 5.8 10*3/uL (ref 4.0–10.5)
nRBC: 0 % (ref 0.0–0.2)

## 2023-01-06 LAB — COMPREHENSIVE METABOLIC PANEL
ALT: 28 U/L (ref 0–44)
AST: 25 U/L (ref 15–41)
Albumin: 4.8 g/dL (ref 3.5–5.0)
Alkaline Phosphatase: 68 U/L (ref 38–126)
Anion gap: 9 (ref 5–15)
BUN: 21 mg/dL (ref 8–23)
CO2: 25 mmol/L (ref 22–32)
Calcium: 9.5 mg/dL (ref 8.9–10.3)
Chloride: 102 mmol/L (ref 98–111)
Creatinine, Ser: 1.37 mg/dL — ABNORMAL HIGH (ref 0.61–1.24)
GFR, Estimated: 57 mL/min — ABNORMAL LOW (ref >60)
Glucose, Bld: 109 mg/dL — ABNORMAL HIGH (ref 70–99)
Potassium: 3.7 mmol/L (ref 3.5–5.1)
Sodium: 136 mmol/L (ref 135–145)
Total Bilirubin: 0.6 mg/dL (ref 0.3–1.2)
Total Protein: 7.6 g/dL (ref 6.5–8.1)

## 2023-01-06 MED ORDER — DARATUMUMAB-HYALURONIDASE-FIHJ 1800-30000 MG-UT/15ML ~~LOC~~ SOLN
1800.0000 mg | Freq: Once | SUBCUTANEOUS | Status: AC
  Administered 2023-01-06: 1800 mg via SUBCUTANEOUS
  Filled 2023-01-06: qty 15

## 2023-01-06 MED ORDER — DIPHENHYDRAMINE HCL 25 MG PO CAPS: 50.0000 mg | ORAL_CAPSULE | Freq: Once | ORAL | Status: DC

## 2023-01-06 NOTE — Assessment & Plan Note (Signed)
Treatment plan as listed above. 

## 2023-01-06 NOTE — Assessment & Plan Note (Signed)
Majority of his lucencies were not hypermetabolic on previous PET scan Xgeva monthly- hold Xgeva due to possible need of dental procedure Continue calcium and vitamin D supplementation.

## 2023-01-06 NOTE — Assessment & Plan Note (Signed)
Avoid nephrotoxin.  Encourage hydration. 

## 2023-01-06 NOTE — Assessment & Plan Note (Signed)
Kappa Light chain multiple myeloma beta 2 microglobulin 5 and normal albumin.  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  06/04/2021 5% plasma cells -August 2022-February 2023, patient completed 7 cycles of daratumumab/Pomalyst/dexamethasone. 11/20/2021, status post bone marrow biopsy which showed 1% of plasma cells.  Likely he has minimal residual disease. Currently on daratumumab maintenance. He takes his supply of dexamethasone 20mg prior to his treatment.  Labs are reviewed and discussed with patient. light chain ratio stable Proceed with daratumumab today .  Obtain 24 hour UPEP 

## 2023-01-06 NOTE — Assessment & Plan Note (Signed)
IVIG if IgG <400   

## 2023-01-06 NOTE — Progress Notes (Signed)
Pt here for follow up. Pt reports he is having tooth pain and has contacted Dentist office. Pt also reports that he has occasional sharp pain to right mid back when he takes a deep breath or when he sits or lays a certain way.

## 2023-01-06 NOTE — Progress Notes (Signed)
Hematology/Oncology Progress note [phone] [phone]      REASON FOR VISIT Follow up for multiple myeloma.   ASSESSMENT & PLAN:   Multiple myeloma (HCC) Kappa Light chain multiple myeloma beta 2 microglobulin 5 and normal albumin.  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  06/04/2021 5% plasma cells -August 2022-February 2023, patient completed 7 cycles of daratumumab/Pomalyst/dexamethasone. 11/20/2021, status post bone marrow biopsy which showed 1% of plasma cells.  Likely he has minimal residual disease. Currently on daratumumab maintenance. He takes his supply of dexamethasone 20mg  prior to his treatment.  Labs are reviewed and discussed with patient. light chain ratio stable Proceed with daratumumab today .  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   Stage 3a chronic kidney disease (HCC) Avoid nephrotoxin.  Encourage hydration.  Thrombocytopenia (HCC) Stable. Monitor counts.    Orders Placed This Encounter  Procedures   Urine culture    Standing Status:   Future    Standing Expiration Date:   01/06/2024   Urinalysis, Complete w Microscopic   Urinalysis, Complete w Microscopic    Standing Status:   Future    Standing Expiration Date:   01/06/2024    Follow up in 4 weeks, Lab MD Dara  All questions were answered. The patient knows to call the clinic with any problems, questions or concerns.  Earlie Server, MD, PhD [facility] Hematology Oncology 01/06/2023   HISTORY OF  PRESENTING ILLNESS:  Luis Burke is a  67 y.o.  male with PMH listed below presents for follow up of multiple myeloma.  Oncology History  Multiple myeloma ([place])  08/19/2018 Bone Marrow Biopsy   08/19/2018 bone marrow biopsy showed hypercellular marrow 80%, involved by plasma cell neoplasm up to 95%.  Consistent with plasma cell myeloma. Myeloma FISH  gain of gain of CEP12   08/25/2018 Imaging   PET scan showed no definite hypermetabolic bone disease but CT findings are highly suspicious for numerous small myelomatous lesions involving the spine, sternum and scattered ribs.   08/26/2018 Initial Diagnosis   Multiple myeloma  # 07/27/2018 multiple myeloma panel showed M protein of 0.1, IgG 662, IgA 49, IgM 7. 08/10/2018, free light chain ratio showed extremely high level of kappa free light chain 10,183, with a kappa lambda light chain ratio of 1414.31,LDH 164, Beta-2 microglobulin 5     08/28/2018 - 03/17/2019 Chemotherapy   RVD x 9     04/23/2019 Bone Marrow Transplant   autologous stem cell bone marrow transplant at [facility].  He received preparative regimen with melphalan 200 mg/m on 04/22/2019 followed by autologous stem cell infusion on 04/23/2019. Transplant course was complicated with febrile neutropenia grade 3, treated with vancomycin and cephapirin until engraftment on 05/06/2019. Patient also had engraftment syndrome and had to be started on Solu-Medrol 25 mg twice daily on 05/05/2019.  Transition to Medrol Dosepak on 05/07/2019 to complete steroid taper as outpatient.  re-vaccination 12 months post transplant   08/10/2019 - 05/2022 Chemotherapy    Ixazomib 3mg  on D1/D8/D15 out of 28 day cycle  07/12/2020 Imaging   CT skeletal survey done at [facility] 1.  Lucent lesion in the L4 spinous process measuring 4 mm which is  nonspecific. Consider confirmation of marrow replacing process with MRI.  2.  Incompletely characterized renal cysts    07/28/2020 Bone Marrow Biopsy    bone  marrow normocellular marrow with polytypic plasmacytosis, 3 to 8%   Patient is in remission.  Cytogenetics negative for trisomy 12.  Negative myeloma FISH panel.   08/05/2020 Imaging   MRI lumbar spine with and without contrast was done at [facility] showed no evidence of spinal metastasis.  Lumbar spondylosis is most pronounced at L5-S1 where there is severe right and moderate left neural foraminal stenosis. 08/11/2020, x-ray cervical spine complete with flexion and extension showed The cervical spine is visualized from C1 to the top of T1 on the lateral  view.  Reversal of the normal cervical lordosis with a focal kyphosis centered at the C5-C6 intervertebral level. Trace anterolisthesis of C4 onto C5. Trace retrolisthesis of C6 on C7. No evidence of dynamic instability is noted on  the flexion or extension views.   No prevertebral soft tissue swelling. Cervical vertebral body heights are maintained. Multilevel degenerative changes of the cervical spine including discogenic disease, most pronounced at C5-C6 and C6-C7, and facet arthropathy most pronounced at C4-C5. Multilevel mild to moderate left neural foraminal osseous narrowing of the mid to lower cervical vertebrae, possibly centrally/artifactual by patient positioning.     05/15/2021 Bone Marrow Biopsy   Bone marrow biopsy showed slightly hypercellular bone marrow for age with slight plasmacytosis.  5% plasma cells with kappa light chain restriction.  Cytogenetics normal.  Myeloma FISH negative I discussed with [place]. we both feel that his bone marrow biopsy result reflects early relapse. Recommend stop Ixazomib. Switch to second line treatment with Daratumumab Pom[place]st Dexamethasone   05/31/2021 - 05/20/2022 Chemotherapy   Patient is on Treatment Plan : MYELOMA Daratumumab + Pomalidomide + Dexamethasone q28d x 7 cycles     05/31/2021 -  Chemotherapy   Patient is on Treatment Plan : MYELOMA Daratumumab IV + Pomalidomide +  Dexamethasone q28d x 7 cycles     09/21/2021 Imaging    PET scan showed no evidence of suspicious or new hypermetabolism, scattered small lucent foci seen previously in the sternum and the spine are less prominent today and have resolved in some regions.  Aortic atherosclerosis.   11/18/2021 Imaging   MRI cervical spine with and without contrast showed no multiple myeloma lesion or marrow edema identified in the cervical spine.  Abnormal dural thickening and a/or abnormal epidural space at C4/C5 levels.  This is nonspecific.  ER physician discussed the case with neurosurgery Dr. Cari Caraway who feels that this is more likely degenerative disc disease.   11/20/2021 Bone Marrow Biopsy   bone marrow biopsy which showed 1% of plasma cells. The plasma cells generally display polyclonal staining pattern for kappa and lambda light chains although a few very small clusters appear kappa light chain restricted.  The findings are very limited but most suggestive of minimal residual plasma cell neoplasm especially in the presence of monoclonal protein with kappa light chain specificity. Normal cytogenetics and myeloma FISH   12/20/2021 -  Chemotherapy   Daratumumab monthly maintenance  # I discussed with patient's [facility] oncologist Dr. Maylene Roes.  We have agreed upon de-escalating his regimen to Daratumumab monthly maintenance for now. Discontinue pomalyst.     Metastasis to bone [facility])   September -Nov 2023, patient  has had recurrent upper respiratory infection/bronchitis/sinusitis. Patient was evaluated by ENT, he was diagnosed with allergic rhinitis, nasal congestion is due to allergy and to hypertrophy.  Chronic sinusitis, finished a course of clindamycin and a prednisone taper.   INTERVAL HISTORY JAXXON DATEMA is a 67 y.o. male who has above history reviewed by me today for follow-up multiple myeloma.  Denies any nausea vomiting diarrhea today He has tooth pain, he plans to contact his dentist.  He also  noticed intermittent right flank pain.   Review of Systems  Constitutional:  Negative for appetite change, chills, fatigue, fever and unexpected weight change.  HENT:   Negative for hearing loss.   Eyes:  Negative for eye problems and icterus.  Respiratory:  Negative for chest tightness, cough and shortness of breath.   Cardiovascular:  Negative for chest pain and leg swelling.  Gastrointestinal:  Negative for abdominal distention and abdominal pain.  Endocrine: Negative for hot flashes.  Genitourinary:  Negative for difficulty urinating, dysuria and frequency.   Musculoskeletal:  Positive for back pain. Negative for arthralgias.  Skin:  Negative for itching and rash.  Neurological:  Negative for light-headedness and numbness.  Hematological:  Negative for adenopathy. Does not bruise/bleed easily.  Psychiatric/Behavioral:  Negative for confusion.     MEDICAL HISTORY:  Past Medical History:  Diagnosis Date   AKI (acute kidney injury) ([place]) 01/06/2019   Anemia    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[place])    autologous stem cell bone marrow transplant on 04/23/2019.   Fatty liver    on u/s 07/2018   Foot fracture    Hyperlipidemia 03/2004   Hypertension 1994   Multiple myeloma ([place])    Snores     SURGICAL HISTORY: Past Surgical History:  Procedure Laterality Date   CATARACT EXTRACTION Left 10/10/2021   CATARACT EXTRACTION W/PHACO Right 03/04/2018   Procedure: CATARACT EXTRACTION PHACO AND INTRAOCULAR LENS PLACEMENT ([place])  RIGHT TORIC;  Surgeon: Leandrew Koyanagi, MD;  Location: [place];  Service: Ophthalmology;  Laterality: Right;  Per [place] no Toric Lens 1:45 5.3   CATARACT EXTRACTION W/PHACO Left 10/10/2021   Procedure: CATARACT EXTRACTION PHACO AND INTRAOCULAR LENS PLACEMENT ([place]) LEFT;  Surgeon: Leandrew Koyanagi, MD;  Location: [place];  Service: Ophthalmology;  Laterality: Left;  5.16 1:03.8   COLONOSCOPY WITH PROPOFOL N/A 07/20/2018   Procedure:  COLONOSCOPY WITH PROPOFOL;  Surgeon: Jonathon Bellows, MD;  Location[facility] ENDOSCOPY;  Service: Gastroenterology;  Laterality: N/A;   ESOPHAGOGASTRODUODENOSCOPY (EGD) WITH PROPOFOL N/A 07/20/2018   Procedure: ESOPHAGOGASTRODUODENOSCOPY (EGD) WITH PROPOFOL;  Surgeon: Jonathon Bellows, MD;  Location[facility] ENDOSCOPY;  Service: Gastroenterology;  Laterality: N/A;   EYE SURGERY Right    HERNIA REPAIR     L Lap herniorraphy  04/2000   Left wrist ganglionectomy      SOCIAL HISTORY: Social History   Socioeconomic History   Marital status: Married    Spouse name: Not on file   Number of children: 1   Years of education: Not on file   Highest education level: Not on file  Occupational History   Occupation: [employer]  Tobacco Use   Smoking status: Never   Smokeless tobacco: Never   Tobacco comments:    Occassionally  Vaping Use   Vaping Use: Never used  Substance and Sexual Activity   Alcohol use: Not Currently    Alcohol/week: 3.0 standard drinks of alcohol    Types: 3 Cans of beer per week   Drug use: No  Sexual activity: Not on file  Other Topics Concern   Not on file  Social History Narrative   Divorced 2009, married 06/03/2021.     His daughter died 4 days after giving birth to the patient's granddaughter   Has joint custody of his deceased daughter's child   Worked at [employer] is [place], retired 2020.     Social Determinants of Health   Financial Resource Strain: Not on file  Food Insecurity: Not on file  Transportation Needs: Not on file  Physical Activity: Not on file  Stress: Not on file  Social Connections: Not on file  Intimate Partner Violence: Not on file    FAMILY HISTORY: Family History  Problem Relation Age of Onset   Hypertension Mother    Diabetes Mother    Heart disease Mother        CAD   Dementia Mother    Prostate cancer Father    Hypertension Father    Heart disease Father        MI 02/03   Colon cancer Father    Hypertension Sister     Hypertension Sister    Hypertension Sister     ALLERGIES:  is allergic to bactrim [sulfamethoxazole-trimethoprim], clonidine derivatives, and tadalafil.  MEDICATIONS:  Current Outpatient Medications  Medication Sig Dispense Refill   acetaminophen (TYLENOL) 325 MG tablet Take 2 tablets (650 mg total) by mouth See admin instructions. 1 hour prior to chemotherapy treatments 30 tablet 0   acyclovir (ZOVIRAX) 400 MG tablet TAKE 1 TABLET(400 MG) BY MOUTH TWICE DAILY 60 tablet 2   amLODipine (NORVASC) 5 MG tablet Take 2 tablets (10 mg total) by mouth daily. 180 tablet 3   ASPIRIN 81 PO Take 81 mg by mouth daily.     B Complex Vitamins (VITAMIN B COMPLEX PO) Take 1 Dose by mouth daily.     co-enzyme Q-10 50 MG capsule Take 50 mg by mouth daily.     [employer] VITAMIN B12 1000 MCG tablet TAKE 1 TABLET BY MOUTH EVERY DAY 90 tablet 3   Denosumab (XGEVA [place]) Inject into the skin every 30 (thirty) days. Last rcvd 09/27/21     dexamethasone (DECADRON) 4 MG tablet Take 5 tablets (20 mg total) by mouth as directed. Take one hour before monthly Darzalex injection. 45 tablet 1   diphenhydrAMINE (BENADRYL) 50 MG tablet Take 1 tablet (50 mg total) by mouth See admin instructions. Take 1 tablet 1 hour prior to chemotherapy treatments. 30 tablet 0   hydrALAZINE (APRESOLINE) 10 MG tablet TAKE 1 TABLET(10 MG) BY MOUTH THREE TIMES DAILY 270 tablet 3   lisinopril (ZESTRIL) 20 MG tablet Take 1 tablet (20 mg total) by mouth daily. 90 tablet 3   magnesium chloride (SLOW-MAG) 64 MG TBEC SR tablet Take 1 tablet (64 mg total) by mouth daily. 60 tablet 0   metoprolol succinate (TOPROL-XL) 50 MG 24 hr tablet TAKE 4 TABLETS BY MOUTH EVERY DAY WITH OR IMMEDIATELY FOLLOWING A MEAL 360 tablet 3   montelukast (SINGULAIR) 10 MG tablet Take 1 tablet (10 mg total) by mouth See admin instructions. Take 1day prior to chemotherapy 30 tablet 1   pravastatin (PRAVACHOL) 10 MG tablet Take 1 tablet (10 mg total) by mouth daily. 90 tablet 3    prochlorperazine (COMPAZINE) 10 MG tablet      sildenafil (REVATIO) 20 MG tablet TAKE 1-5 TABLETS (20-100 MG TOTAL) BY MOUTH DAILY AS NEEDED 12 tablet 5   zinc gluconate 50 MG tablet Take 50 mg  by mouth daily.     No current facility-administered medications for this visit.   Facility-Administered Medications Ordered in Other Visits  Medication Dose Route Frequency Provider Last Rate Last Admin   0.9 %  sodium chloride infusion   Intravenous Once Earlie Server, MD         PHYSICAL EXAMINATION: ECOG PERFORMANCE STATUS: 1 - Symptomatic but completely ambulatory Vitals:   01/06/23 1003  BP: (!) 147/96  Pulse: 66  Resp: 18  Temp: (!) 97.4 F (36.3 C)   Filed Weights   01/06/23 1003  Weight: 210 lb 9.6 oz (95.5 kg)    Physical Exam Constitutional:      General: He is not in acute distress. HENT:     Head: Normocephalic and atraumatic.  Eyes:     General: No scleral icterus. Cardiovascular:     Rate and Rhythm: Normal rate and regular rhythm.     Heart sounds: Normal heart sounds.  Pulmonary:     Effort: Pulmonary effort is normal. No respiratory distress.     Breath sounds: No wheezing.  Abdominal:     General: Bowel sounds are normal. There is no distension.     Palpations: Abdomen is soft.  Musculoskeletal:        General: No deformity. Normal range of motion.     Cervical back: Normal range of motion and neck supple.  Skin:    General: Skin is warm and dry.     Findings: No erythema or rash.  Neurological:     Mental Status: He is alert and oriented to person, place, and time. Mental status is at baseline.     Cranial Nerves: No cranial nerve deficit.     Coordination: Coordination normal.  Psychiatric:        Mood and Affect: Mood normal.      LABORATORY DATA:  I have reviewed the data as listed    Latest Ref Rng & Units 01/06/2023    9:47 AM 12/09/2022    8:59 AM 11/06/2022    8:49 AM  CBC  WBC 4.0 - 10.5 K/uL 5.8  6.8  5.7   Hemoglobin 13.0 - 17.0 g/dL  14.3  14.1  14.2   Hematocrit 39.0 - 52.0 % 42.3  41.2  41.9   Platelets 150 - 400 K/uL 100  118  92       Latest Ref Rng & Units 01/06/2023    9:47 AM 12/09/2022    8:59 AM 11/06/2022    8:49 AM  CMP  Glucose 70 - 99 mg/dL 109  95  100   BUN 8 - 23 mg/dL 21  18  21    Creatinine 0.61 - 1.24 mg/dL 1.37  1.37  1.26   Sodium 135 - 145 mmol/L 136  138  140   Potassium 3.5 - 5.1 mmol/L 3.7  3.8  4.1   Chloride 98 - 111 mmol/L 102  101  104   CO2 22 - 32 mmol/L 25  25  25    Calcium 8.9 - 10.3 mg/dL 9.5  9.6  9.4   Total Protein 6.5 - 8.1 g/dL 7.6  7.0  6.9   Total Bilirubin 0.3 - 1.2 mg/dL 0.6  0.8  0.6   Alkaline Phos 38 - 126 U/L 68  70  61   AST 15 - 41 U/L 25  24  21    ALT 0 - 44 U/L 28  18  17       Lab Results  Component  Value Date   KPAFRELGTCHN 23.7 (H) 12/09/2022   LAMBDASER 13.2 12/09/2022   KAPLAMBRATIO 1.80 (H) 12/09/2022    RADIOGRAPHIC STUDIES: I have personally reviewed the radiological images as listed and agreed with the findings in the report. No results found.

## 2023-01-06 NOTE — Assessment & Plan Note (Signed)
Stable.  Monitor counts. 

## 2023-01-07 LAB — KAPPA/LAMBDA LIGHT CHAINS
Kappa free light chain: 22.1 mg/L — ABNORMAL HIGH (ref 3.3–19.4)
Kappa, lambda light chain ratio: 2.66 — ABNORMAL HIGH (ref 0.26–1.65)
Lambda free light chains: 8.3 mg/L (ref 5.7–26.3)

## 2023-01-10 LAB — MULTIPLE MYELOMA PANEL, SERUM
Albumin SerPl Elph-Mcnc: 4.3 g/dL (ref 2.9–4.4)
Albumin/Glob SerPl: 1.9 — ABNORMAL HIGH (ref 0.7–1.7)
Alpha 1: 0.2 g/dL (ref 0.0–0.4)
Alpha2 Glob SerPl Elph-Mcnc: 0.7 g/dL (ref 0.4–1.0)
B-Globulin SerPl Elph-Mcnc: 0.9 g/dL (ref 0.7–1.3)
Gamma Glob SerPl Elph-Mcnc: 0.4 g/dL (ref 0.4–1.8)
Globulin, Total: 2.3 g/dL (ref 2.2–3.9)
IgA: 77 mg/dL (ref 61–437)
IgG (Immunoglobin G), Serum: 529 mg/dL — ABNORMAL LOW (ref 603–1613)
IgM (Immunoglobulin M), Srm: 40 mg/dL (ref 20–172)
Total Protein ELP: 6.6 g/dL (ref 6.0–8.5)

## 2023-01-23 ENCOUNTER — Other Ambulatory Visit: Payer: Self-pay | Admitting: Oncology

## 2023-01-23 ENCOUNTER — Telehealth: Payer: Self-pay | Admitting: *Deleted

## 2023-01-23 DIAGNOSIS — C9000 Multiple myeloma not having achieved remission: Secondary | ICD-10-CM

## 2023-01-23 NOTE — Telephone Encounter (Signed)
Per Dr. Cathie Hoops ok to proceed with invasive dental procedure within 6 weeks of Xgeva. Last Xgeva dose was on 12/09/22, so he should be good with root canal, Dr. Cathie Hoops will hold off Xgeva at next visit. His appts should show up in Mychart once they are scheduled. Dr. Cathie Hoops has enterd orders in IS.

## 2023-01-23 NOTE — Telephone Encounter (Signed)
Patient called stating that he did not get appointment for infusion this month, He doe snot have any follow up scheduled. Last seen 3/25 and disposition was enter for follow up in 4 weeks Follow-up and Disposition History  01/06/2023 1023 - [name], MD  Check-out note: Hold Xgeva Daratumumab today 4 weeks lab MD Daratumumab + Xgeva Check UA, urine culture  - right flank pain  zy  Patient is also reporting that he needs to have a root canal and is asking Dr Cathie Hoops if he can do that since he is coming up on not having his Wadie Lessen in past 4 weeks. Please Francee Piccolo

## 2023-01-23 NOTE — Telephone Encounter (Signed)
Call returned to patient and informed of response regarding root canal. He said that he will get it scheduled and  look for appts in my chart for our office

## 2023-02-03 ENCOUNTER — Encounter: Payer: Self-pay | Admitting: Oncology

## 2023-02-03 ENCOUNTER — Inpatient Hospital Stay (HOSPITAL_BASED_OUTPATIENT_CLINIC_OR_DEPARTMENT_OTHER): Payer: Medicare Other | Admitting: Oncology

## 2023-02-03 ENCOUNTER — Inpatient Hospital Stay: Payer: Medicare Other

## 2023-02-03 ENCOUNTER — Inpatient Hospital Stay: Payer: Medicare Other | Attending: Oncology

## 2023-02-03 VITALS — BP 143/93 | HR 65 | Temp 97.1°F | Resp 18 | Wt 207.9 lb

## 2023-02-03 DIAGNOSIS — I129 Hypertensive chronic kidney disease with stage 1 through stage 4 chronic kidney disease, or unspecified chronic kidney disease: Secondary | ICD-10-CM | POA: Insufficient documentation

## 2023-02-03 DIAGNOSIS — D696 Thrombocytopenia, unspecified: Secondary | ICD-10-CM | POA: Diagnosis not present

## 2023-02-03 DIAGNOSIS — N1831 Chronic kidney disease, stage 3a: Secondary | ICD-10-CM | POA: Diagnosis not present

## 2023-02-03 DIAGNOSIS — Z5111 Encounter for antineoplastic chemotherapy: Secondary | ICD-10-CM | POA: Diagnosis not present

## 2023-02-03 DIAGNOSIS — C9000 Multiple myeloma not having achieved remission: Secondary | ICD-10-CM

## 2023-02-03 DIAGNOSIS — R771 Abnormality of globulin: Secondary | ICD-10-CM

## 2023-02-03 DIAGNOSIS — Z5112 Encounter for antineoplastic immunotherapy: Secondary | ICD-10-CM | POA: Insufficient documentation

## 2023-02-03 DIAGNOSIS — C7951 Secondary malignant neoplasm of bone: Secondary | ICD-10-CM

## 2023-02-03 LAB — COMPREHENSIVE METABOLIC PANEL
ALT: 18 U/L (ref 0–44)
AST: 21 U/L (ref 15–41)
Albumin: 4.8 g/dL (ref 3.5–5.0)
Alkaline Phosphatase: 66 U/L (ref 38–126)
Anion gap: 12 (ref 5–15)
BUN: 21 mg/dL (ref 8–23)
CO2: 21 mmol/L — ABNORMAL LOW (ref 22–32)
Calcium: 9.5 mg/dL (ref 8.9–10.3)
Chloride: 103 mmol/L (ref 98–111)
Creatinine, Ser: 1.27 mg/dL — ABNORMAL HIGH (ref 0.61–1.24)
GFR, Estimated: >60 mL/min (ref >60)
Glucose, Bld: 113 mg/dL — ABNORMAL HIGH (ref 70–99)
Potassium: 3.6 mmol/L (ref 3.5–5.1)
Sodium: 136 mmol/L (ref 135–145)
Total Bilirubin: 0.5 mg/dL (ref 0.3–1.2)
Total Protein: 7.3 g/dL (ref 6.5–8.1)

## 2023-02-03 LAB — CBC WITH DIFFERENTIAL/PLATELET
Abs Immature Granulocytes: 0.02 10*3/uL (ref 0.00–0.07)
Basophils Absolute: 0 10*3/uL (ref 0.0–0.1)
Basophils Relative: 0 %
Eosinophils Absolute: 0 10*3/uL (ref 0.0–0.5)
Eosinophils Relative: 1 %
HCT: 42.9 % (ref 39.0–52.0)
Hemoglobin: 14.9 g/dL (ref 13.0–17.0)
Immature Granulocytes: 0 %
Lymphocytes Relative: 37 %
Lymphs Abs: 2.3 10*3/uL (ref 0.7–4.0)
MCH: 33.6 pg (ref 26.0–34.0)
MCHC: 34.7 g/dL (ref 30.0–36.0)
MCV: 96.8 fL (ref 80.0–100.0)
Monocytes Absolute: 0.4 10*3/uL (ref 0.1–1.0)
Monocytes Relative: 6 %
Neutro Abs: 3.6 10*3/uL (ref 1.7–7.7)
Neutrophils Relative %: 56 %
Platelets: 100 10*3/uL — ABNORMAL LOW (ref 150–400)
RBC: 4.43 MIL/uL (ref 4.22–5.81)
RDW: 12.6 % (ref 11.5–15.5)
WBC: 6.3 10*3/uL (ref 4.0–10.5)
nRBC: 0 % (ref 0.0–0.2)

## 2023-02-03 MED ORDER — DARATUMUMAB-HYALURONIDASE-FIHJ 1800-30000 MG-UT/15ML ~~LOC~~ SOLN
1800.0000 mg | Freq: Once | SUBCUTANEOUS | Status: AC
  Administered 2023-02-03: 1800 mg via SUBCUTANEOUS
  Filled 2023-02-03: qty 15

## 2023-02-03 MED ORDER — DIPHENHYDRAMINE HCL 25 MG PO CAPS: 50.0000 mg | ORAL_CAPSULE | Freq: Once | ORAL | Status: DC

## 2023-02-03 NOTE — Progress Notes (Signed)
Hematology/Oncology Progress note [phone] [phone]      REASON FOR VISIT Follow up for multiple myeloma.   ASSESSMENT & PLAN:   Multiple myeloma (HCC) Kappa Light chain multiple myeloma beta 2 microglobulin 5 and normal albumin.  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  06/04/2021 5% plasma cells -August 2022-February 2023, patient completed 7 cycles of daratumumab/Pomalyst/dexamethasone. 11/20/2021, status post bone marrow biopsy which showed 1% of plasma cells.  Likely he has minimal residual disease.  Currently on daratumumab maintenance. He takes his supply of dexamethasone  prior to his treatment.  Labs are reviewed and discussed with patient. light chain ratio stable Proceed with daratumumab today .    Thrombocytopenia (HCC) Stable. Monitor counts.   Encounter for antineoplastic chemotherapy Treatment plan as listed above  Stage 3a chronic kidney disease (HCC) Avoid nephrotoxin.  Encourage hydration.  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    Orders Placed This Encounter  Procedures   Multiple Myeloma Panel (SPEP&IFE w/QIG)    Standing Status:   Future    Standing Expiration Date:   04/27/2024   Kappa/lambda light chains    Standing Status:   Future    Standing Expiration Date:   04/27/2024   Comprehensive metabolic panel    Standing Status:   Future    Standing Expiration Date:   04/27/2024   CBC with Differential    Standing Status:   Future    Standing Expiration Date:   04/27/2024    Follow up in 4 weeks, Lab MD Lynwood Dawley + Rivka Barbara All questions were  answered. The patient knows to call the clinic with any problems, questions or concerns.  [name], MD, PhD [facility] Hematology Oncology 02/03/2023   HISTORY OF PRESENTING ILLNESS:  Luis Burke is a  67 y.o.  male with PMH listed below presents for follow up of multiple myeloma.  Oncology History  Multiple myeloma  08/19/2018 Bone Marrow Biopsy   08/19/2018 bone marrow biopsy showed hypercellular marrow 80%, involved by plasma cell neoplasm up to 95%.  Consistent with plasma cell myeloma. Myeloma FISH  gain of gain of CEP12   08/25/2018 Imaging   PET scan showed no definite hypermetabolic bone disease but CT findings are highly suspicious for numerous small myelomatous lesions involving the spine, sternum and scattered ribs.   08/26/2018 Initial Diagnosis   Multiple myeloma  # 07/27/2018 multiple myeloma panel showed M protein of 0.1, IgG 662, IgA 49, IgM 7. 08/10/2018, free light chain ratio showed extremely high level of kappa free light chain 10,183, with a kappa lambda light chain ratio of 1414.31,LDH 164, Beta-2 microglobulin 5     08/28/2018 - 03/17/2019 Chemotherapy   RVD x 9     04/23/2019 Bone Marrow Transplant   autologous stem cell bone marrow transplant at [facility].  He received preparative regimen with melphalan 200 mg/m on 04/22/2019 followed by autologous stem cell infusion on 04/23/2019. Transplant course was complicated with febrile neutropenia grade 3, treated with vancomycin and cephapirin until engraftment on 05/06/2019. Patient also had engraftment syndrome and had to be started on Solu-Medrol 25 mg twice daily on 05/05/2019.  Transition to Medrol  Dosepak on 05/07/2019 to complete steroid taper as outpatient.  re-vaccination 12 months post transplant   08/10/2019 - 05/2022 Chemotherapy    Ixazomib 3mg  on D1/D8/D15 out of 28 day cycle       07/12/2020 Imaging   CT skeletal survey done at [facility] 1.  Lucent lesion in the L4 spinous process measuring 4 mm which is   nonspecific. Consider confirmation of marrow replacing process with MRI.  2.  Incompletely characterized renal cysts    07/28/2020 Bone Marrow Biopsy    bone marrow normocellular marrow with polytypic plasmacytosis, 3 to 8%   Patient is in remission.  Cytogenetics negative for trisomy 12.  Negative myeloma FISH panel.   08/05/2020 Imaging   MRI lumbar spine with and without contrast was done at [facility] showed no evidence of spinal metastasis.  Lumbar spondylosis is most pronounced at L5-S1 where there is severe right and moderate left neural foraminal stenosis. 08/11/2020, x-ray cervical spine complete with flexion and extension showed The cervical spine is visualized from C1 to the top of T1 on the lateral  view.  Reversal of the normal cervical lordosis with a focal kyphosis centered at the C5-C6 intervertebral level. Trace anterolisthesis of C4 onto C5. Trace retrolisthesis of C6 on C7. No evidence of dynamic instability is noted on  the flexion or extension views.   No prevertebral soft tissue swelling. Cervical vertebral body heights are maintained. Multilevel degenerative changes of the cervical spine including discogenic disease, most pronounced at C5-C6 and C6-C7, and facet arthropathy most pronounced at C4-C5. Multilevel mild to moderate left neural foraminal osseous narrowing of the mid to lower cervical vertebrae, possibly centrally/artifactual by patient positioning.     05/15/2021 Bone Marrow Biopsy   Bone marrow biopsy showed slightly hypercellular bone marrow for age with slight plasmacytosis.  5% plasma cells with kappa light chain restriction.  Cytogenetics normal.  Myeloma FISH negative I discussed with [facility] Dr.Choi. we both feel that his bone marrow biopsy result reflects early relapse. Recommend stop Ixazomib. Switch to second line treatment with Daratumumab Pomlyst Dexamethasone   05/31/2021 - 05/20/2022 Chemotherapy   Patient is on Treatment Plan : MYELOMA Daratumumab +  Pomalidomide + Dexamethasone q28d x 7 cycles     05/31/2021 -  Chemotherapy   Patient is on Treatment Plan : MYELOMA Daratumumab IV + Pomalidomide + Dexamethasone q28d x 7 cycles     09/21/2021 Imaging    PET scan showed no evidence of suspicious or new hypermetabolism, scattered small lucent foci seen previously in the sternum and the spine are less prominent today and have resolved in some regions.  Aortic atherosclerosis.   11/18/2021 Imaging   MRI cervical spine with and without contrast showed no multiple myeloma lesion or marrow edema identified in the cervical spine.  Abnormal dural thickening and a/or abnormal epidural space at C4/C5 levels.  This is nonspecific.  ER physician discussed the case with neurosurgery Dr. Marcell Barlow who feels that this is more likely degenerative disc disease.   11/20/2021 Bone Marrow Biopsy   bone marrow biopsy which showed 1% of plasma cells. The plasma cells generally display polyclonal staining pattern for kappa and lambda light chains although a few very small clusters appear kappa light chain restricted.  The findings are very limited but most suggestive of minimal residual plasma cell neoplasm especially in the presence of monoclonal protein with kappa light chain specificity. Normal cytogenetics and myeloma FISH   12/20/2021 -  Chemotherapy   Daratumumab monthly maintenance  #  I discussed with patient's [facility] oncologist Dr. Alvino Chapel.  We have agreed upon de-escalating his regimen to Daratumumab monthly maintenance for now. Discontinue pomalyst.     Metastasis to bone   September -Nov 2023, patient has had recurrent upper respiratory infection/bronchitis/sinusitis. Patient was evaluated by ENT, he was diagnosed with allergic rhinitis, nasal congestion is due to allergy and to hypertrophy.  Chronic sinusitis, finished a course of clindamycin and a prednisone taper.   INTERVAL HISTORY APOLINAR BERO is a 67 y.o. male who has above history reviewed by me today  for follow-up multiple myeloma.  Denies any nausea vomiting diarrhea today He has tooth pain,he has seen dentist and has crown adjusted.    Review of Systems  Constitutional:  Negative for appetite change, chills, fatigue, fever and unexpected weight change.  HENT:   Negative for hearing loss.   Eyes:  Negative for eye problems and icterus.  Respiratory:  Negative for chest tightness, cough and shortness of breath.   Cardiovascular:  Negative for chest pain and leg swelling.  Gastrointestinal:  Negative for abdominal distention and abdominal pain.  Endocrine: Negative for hot flashes.  Genitourinary:  Negative for difficulty urinating, dysuria and frequency.   Musculoskeletal:  Positive for back pain. Negative for arthralgias.  Skin:  Negative for itching and rash.  Neurological:  Negative for light-headedness and numbness.  Hematological:  Negative for adenopathy. Does not bruise/bleed easily.  Psychiatric/Behavioral:  Negative for confusion.     MEDICAL HISTORY:  Past Medical History:  Diagnosis Date   AKI (acute kidney injury) 01/06/2019   Anemia    Bone marrow transplant status    autologous stem cell bone marrow transplant on 04/23/2019.   Fatty liver    on u/s 07/2018   Foot fracture    Hyperlipidemia 03/2004   Hypertension 1994   Multiple myeloma    Snores     SURGICAL HISTORY: Past Surgical History:  Procedure Laterality Date   CATARACT EXTRACTION Left 10/10/2021   CATARACT EXTRACTION W/PHACO Right 03/04/2018   Procedure: CATARACT EXTRACTION PHACO AND INTRAOCULAR LENS PLACEMENT (IOC)  RIGHT TORIC;  Surgeon: Lockie Mola, MD;  Location: [facility];  Service: Ophthalmology;  Laterality: Right;  Per [place] no Toric Lens 1:45 5.3   CATARACT EXTRACTION W/PHACO Left 10/10/2021   Procedure: CATARACT EXTRACTION PHACO AND INTRAOCULAR LENS PLACEMENT (IOC) LEFT;  Surgeon: Lockie Mola, MD;  Location: [facility];  Service: Ophthalmology;   Laterality: Left;  5.16 1:03.8   COLONOSCOPY WITH PROPOFOL N/A 07/20/2018   Procedure: COLONOSCOPY WITH PROPOFOL;  Surgeon: Wyline Mood, MD;  Location[facility];  Service: Gastroenterology;  Laterality: N/A;   ESOPHAGOGASTRODUODENOSCOPY (EGD) WITH PROPOFOL N/A 07/20/2018   Procedure: ESOPHAGOGASTRODUODENOSCOPY (EGD) WITH PROPOFOL;  Surgeon: Wyline Mood, MD;  Location: [facility] ENDOSCOPY;  Service: Gastroenterology;  Laterality: N/A;   EYE SURGERY Right    HERNIA REPAIR     L Lap herniorraphy  04/2000   Left wrist ganglionectomy      SOCIAL HISTORY: Social History   Socioeconomic History   Marital status: Married    Spouse name: Not on file   Number of children: 1   Years of education: Not on file   Highest education level: Not on file  Occupational History   Occupation: [employer]  Tobacco Use   Smoking status: Never   Smokeless tobacco: Never   Tobacco comments:    Occassionally  Vaping Use   Vaping Use: Never used  Substance and Sexual Activity   Alcohol use:  Not Currently    Alcohol/week: 3.0 standard drinks of alcohol    Types: 3 Cans of beer per week   Drug use: No   Sexual activity: Not on file  Other Topics Concern   Not on file  Social History Narrative   Divorced 2009, married 06-04-21.     His daughter died 4 days after giving birth to the patient's granddaughter   Has joint custody of his deceased daughter's child   Worked at [employer] is [place], retired 2020.     Social Determinants of Health   Financial Resource Strain: Not on file  Food Insecurity: Not on file  Transportation Needs: Not on file  Physical Activity: Not on file  Stress: Not on file  Social Connections: Not on file  Intimate Partner Violence: Not on file    FAMILY HISTORY: Family History  Problem Relation Age of Onset   Hypertension Mother    Diabetes Mother    Heart disease Mother        CAD   Dementia Mother    Prostate cancer Father    Hypertension Father     Heart disease Father        MI 02/03   Colon cancer Father    Hypertension Sister    Hypertension Sister    Hypertension Sister     ALLERGIES:  is allergic to bactrim [sulfamethoxazole-trimethoprim], clonidine derivatives, and tadalafil.  MEDICATIONS:  Current Outpatient Medications  Medication Sig Dispense Refill   acetaminophen (TYLENOL) 325 MG tablet Take 2 tablets (650 mg total) by mouth See admin instructions. 1 hour prior to chemotherapy treatments 30 tablet 0   acyclovir (ZOVIRAX) 400 MG tablet TAKE 1 TABLET(400 MG) BY MOUTH TWICE DAILY 60 tablet 2   amLODipine (NORVASC) 5 MG tablet Take 2 tablets (10 mg total) by mouth daily. 180 tablet 3   ASPIRIN 81 PO Take 81 mg by mouth daily.     B Complex Vitamins (VITAMIN B COMPLEX PO) Take 1 Dose by mouth daily.     co-enzyme Q-10 50 MG capsule Take 50 mg by mouth daily.     [employer] VITAMIN B12 1000 MCG tablet TAKE 1 TABLET BY MOUTH EVERY DAY 90 tablet 3   Denosumab (XGEVA [place]) Inject into the skin every 30 (thirty) days. Last rcvd 09/27/21     dexamethasone (DECADRON) 4 MG tablet Take 5 tablets (20 mg total) by mouth as directed. Take one hour before monthly Darzalex injection. 45 tablet 1   diphenhydrAMINE (BENADRYL) 50 MG tablet Take 1 tablet (50 mg total) by mouth See admin instructions. Take 1 tablet 1 hour prior to chemotherapy treatments. 30 tablet 0   hydrALAZINE (APRESOLINE) 10 MG tablet TAKE 1 TABLET(10 MG) BY MOUTH THREE TIMES DAILY 270 tablet 3   lisinopril (ZESTRIL) 20 MG tablet Take 1 tablet (20 mg total) by mouth daily. 90 tablet 3   magnesium chloride (SLOW-MAG) 64 MG TBEC SR tablet Take 1 tablet (64 mg total) by mouth daily. 60 tablet 0   metoprolol succinate (TOPROL-XL) 50 MG 24 hr tablet TAKE 4 TABLETS BY MOUTH EVERY DAY WITH OR IMMEDIATELY FOLLOWING A MEAL 360 tablet 3   montelukast (SINGULAIR) 10 MG tablet Take 1 tablet (10 mg total) by mouth See admin instructions. Take 1day prior to chemotherapy 30 tablet 1   pravastatin  (PRAVACHOL) 10 MG tablet Take 1 tablet (10 mg total) by mouth daily. 90 tablet 3   prochlorperazine (COMPAZINE) 10 MG tablet      sildenafil (  REVATIO) 20 MG tablet TAKE 1-5 TABLETS (20-100 MG TOTAL) BY MOUTH DAILY AS NEEDED 12 tablet 5   zinc gluconate 50 MG tablet Take 50 mg by mouth daily.     No current facility-administered medications for this visit.   Facility-Administered Medications Ordered in Other Visits  Medication Dose Route Frequency Provider Last Rate Last Admin   0.9 %  sodium chloride infusion   Intravenous Once [name], MD       diphenhydrAMINE (BENADRYL) capsule 50 mg  50 mg Oral Once [name], MD         PHYSICAL EXAMINATION: ECOG PERFORMANCE STATUS: 1 - Symptomatic but completely ambulatory Vitals:   02/03/23 1304  BP: (!) 143/93  Pulse: 65  Resp: 18  Temp: (!) 97.1 F (36.2 C)   Filed Weights   02/03/23 1304  Weight: 207 lb 14.4 oz (94.3 kg)    Physical Exam Constitutional:      General: He is not in acute distress. HENT:     Head: Normocephalic and atraumatic.  Eyes:     General: No scleral icterus. Cardiovascular:     Rate and Rhythm: Normal rate and regular rhythm.     Heart sounds: Normal heart sounds.  Pulmonary:     Effort: Pulmonary effort is normal. No respiratory distress.     Breath sounds: No wheezing.  Abdominal:     General: Bowel sounds are normal. There is no distension.     Palpations: Abdomen is soft.  Musculoskeletal:        General: No deformity. Normal range of motion.     Cervical back: Normal range of motion and neck supple.  Skin:    General: Skin is warm and dry.     Findings: No erythema or rash.  Neurological:     Mental Status: He is alert and oriented to person, place, and time. Mental status is at baseline.     Cranial Nerves: No cranial nerve deficit.     Coordination: Coordination normal.  Psychiatric:        Mood and Affect: Mood normal.      LABORATORY DATA:  I have reviewed the data as listed     Latest Ref Rng & Units 02/03/2023   12:42 PM 01/06/2023    9:47 AM 12/09/2022    8:59 AM  CBC  WBC 4.0 - 10.5 K/uL 6.3  5.8  6.8   Hemoglobin 13.0 - 17.0 g/dL 44.0  34.7  42.5   Hematocrit 39.0 - 52.0 % 42.9  42.3  41.2   Platelets 150 - 400 K/uL 100  100  118       Latest Ref Rng & Units 02/03/2023   12:42 PM 01/06/2023    9:47 AM 12/09/2022    8:59 AM  CMP  Glucose 70 - 99 mg/dL 956  387  95   BUN 8 - 23 mg/dL 21  21  18    Creatinine 0.61 - 1.24 mg/dL 5.64  3.32  9.51   Sodium 135 - 145 mmol/L 136  136  138   Potassium 3.5 - 5.1 mmol/L 3.6  3.7  3.8   Chloride 98 - 111 mmol/L 103  102  101   CO2 22 - 32 mmol/L 21  25  25    Calcium 8.9 - 10.3 mg/dL 9.5  9.5  9.6   Total Protein 6.5 - 8.1 g/dL 7.3  7.6  7.0   Total Bilirubin 0.3 - 1.2 mg/dL 0.5  0.6  0.8  Alkaline Phos 38 - 126 U/L 66  68  70   AST 15 - 41 U/L 21  25  24    ALT 0 - 44 U/L 18  28  18       Lab Results  Component Value Date   KPAFRELGTCHN 22.1 (H) 01/06/2023   LAMBDASER 8.3 01/06/2023   KAPLAMBRATIO 2.66 (H) 01/06/2023    RADIOGRAPHIC STUDIES: I have personally reviewed the radiological images as listed and agreed with the findings in the report. No results found.

## 2023-02-03 NOTE — Assessment & Plan Note (Signed)
Stable.  Monitor counts. 

## 2023-02-03 NOTE — Assessment & Plan Note (Signed)
Treatment plan as listed above. 

## 2023-02-03 NOTE — Assessment & Plan Note (Signed)
Majority of his lucencies were not hypermetabolic on previous PET scan Xgeva monthly- hold Xgeva due to possible need of dental procedure Continue calcium and vitamin D supplementation.  

## 2023-02-03 NOTE — Assessment & Plan Note (Signed)
Kappa Light chain multiple myeloma beta 2 microglobulin 5 and normal albumin.  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  06/04/2021 5% plasma cells -August 2022-February 2023, patient completed 7 cycles of daratumumab/Pomalyst/dexamethasone. 11/20/2021, status post bone marrow biopsy which showed 1% of plasma cells.  Likely he has minimal residual disease. Currently on daratumumab maintenance. He takes his supply of dexamethasone 20mg prior to his treatment.  Labs are reviewed and discussed with patient. light chain ratio stable Proceed with daratumumab today .   

## 2023-02-03 NOTE — Assessment & Plan Note (Signed)
IVIG if IgG <400   

## 2023-02-03 NOTE — Assessment & Plan Note (Signed)
Avoid nephrotoxin.  Encourage hydration. 

## 2023-02-03 NOTE — Progress Notes (Signed)
Pt here for follow up. Pt reports that dental work is still pending and he would like to hold off [place] today.

## 2023-02-03 NOTE — Patient Instructions (Signed)
[facility]  Discharge Instructions: Thank you for choosing [facility] to provide your oncology and hematology care.  If you have a lab appointment with the Cancer Center, please go directly to the Cancer Center and check in at the registration area.  Wear comfortable clothing and clothing appropriate for easy access to any Portacath or PICC line.   We strive to give you quality time with your provider. You may need to reschedule your appointment if you arrive late (15 or more minutes).  Arriving late affects you and other patients whose appointments are after yours.  Also, if you miss three or more appointments without notifying the office, you may be dismissed from the clinic at the provider's discretion.      For prescription refill requests, have your pharmacy contact our office and allow 72 hours for refills to be completed.    Today you received the following chemotherapy and/or immunotherapy agents Darzalex      To help prevent nausea and vomiting after your treatment, we encourage you to take your nausea medication as directed.  BELOW ARE SYMPTOMS THAT SHOULD BE REPORTED IMMEDIATELY: *FEVER GREATER THAN 100.4 F (38 C) OR HIGHER *CHILLS OR SWEATING *NAUSEA AND VOMITING THAT IS NOT CONTROLLED WITH YOUR NAUSEA MEDICATION *UNUSUAL SHORTNESS OF BREATH *UNUSUAL BRUISING OR BLEEDING *URINARY PROBLEMS (pain or burning when urinating, or frequent urination) *BOWEL PROBLEMS (unusual diarrhea, constipation, pain near the anus) TENDERNESS IN MOUTH AND THROAT WITH OR WITHOUT PRESENCE OF ULCERS (sore throat, sores in mouth, or a toothache) UNUSUAL RASH, SWELLING OR PAIN  UNUSUAL VAGINAL DISCHARGE OR ITCHING   Items with * indicate a potential emergency and should be followed up as soon as possible or go to the Emergency Department if any problems should occur.  Please show the CHEMOTHERAPY ALERT CARD or IMMUNOTHERAPY ALERT CARD at check-in to  the Emergency Department and triage nurse.  Should you have questions after your visit or need to cancel or reschedule your appointment, please contact [facility] [place] [facility]  [phone] and follow the prompts.  Office hours are 8:00 a.m. to 4:30 p.m. Monday - Friday. Please note that voicemails left after 4:00 p.m. may not be returned until the following business day.  We are closed weekends and major holidays. You have access to a nurse at all times for urgent questions. Please call the main number to the clinic [phone] and follow the prompts.  For any non-urgent questions, you may also contact your provider using MyChart. We now offer e-Visits for anyone 18 and older to request care online for non-urgent symptoms. For details visit [email or web address].   Also download the MyChart app! Go to the app store, search "MyChart", open the app, select [facility], and log in with your MyChart username and password.      

## 2023-02-04 LAB — KAPPA/LAMBDA LIGHT CHAINS
Kappa free light chain: 26.5 mg/L — ABNORMAL HIGH (ref 3.3–19.4)
Kappa, lambda light chain ratio: 3.44 — ABNORMAL HIGH (ref 0.26–1.65)
Lambda free light chains: 7.7 mg/L (ref 5.7–26.3)

## 2023-02-09 LAB — MULTIPLE MYELOMA PANEL, SERUM
Albumin SerPl Elph-Mcnc: 4.2 g/dL (ref 2.9–4.4)
Albumin/Glob SerPl: 1.8 — ABNORMAL HIGH (ref 0.7–1.7)
Alpha 1: 0.2 g/dL (ref 0.0–0.4)
Alpha2 Glob SerPl Elph-Mcnc: 0.7 g/dL (ref 0.4–1.0)
B-Globulin SerPl Elph-Mcnc: 1 g/dL (ref 0.7–1.3)
Gamma Glob SerPl Elph-Mcnc: 0.4 g/dL (ref 0.4–1.8)
Globulin, Total: 2.4 g/dL (ref 2.2–3.9)
IgA: 69 mg/dL (ref 61–437)
IgG (Immunoglobin G), Serum: 535 mg/dL — ABNORMAL LOW (ref 603–1613)
IgM (Immunoglobulin M), Srm: 21 mg/dL (ref 20–172)
Total Protein ELP: 6.6 g/dL (ref 6.0–8.5)

## 2023-02-17 ENCOUNTER — Other Ambulatory Visit: Payer: Self-pay | Admitting: Oncology

## 2023-02-17 ENCOUNTER — Other Ambulatory Visit: Payer: Self-pay

## 2023-02-17 MED ORDER — ACYCLOVIR 400 MG PO TABS: ORAL_TABLET | ORAL | 2 refills | Status: DC

## 2023-03-03 ENCOUNTER — Other Ambulatory Visit: Payer: Medicare Other

## 2023-03-03 ENCOUNTER — Ambulatory Visit: Payer: Medicare Other

## 2023-03-03 ENCOUNTER — Ambulatory Visit: Payer: Medicare Other | Admitting: Oncology

## 2023-03-03 ENCOUNTER — Encounter: Payer: Self-pay | Admitting: Oncology

## 2023-03-03 ENCOUNTER — Inpatient Hospital Stay (HOSPITAL_BASED_OUTPATIENT_CLINIC_OR_DEPARTMENT_OTHER): Payer: Medicare Other | Admitting: Oncology

## 2023-03-03 ENCOUNTER — Inpatient Hospital Stay: Payer: Medicare Other

## 2023-03-03 ENCOUNTER — Inpatient Hospital Stay: Payer: Medicare Other | Attending: Oncology

## 2023-03-03 VITALS — BP 141/92 | HR 64 | Temp 97.8°F | Resp 18 | Wt 212.6 lb

## 2023-03-03 DIAGNOSIS — I129 Hypertensive chronic kidney disease with stage 1 through stage 4 chronic kidney disease, or unspecified chronic kidney disease: Secondary | ICD-10-CM | POA: Insufficient documentation

## 2023-03-03 DIAGNOSIS — D696 Thrombocytopenia, unspecified: Secondary | ICD-10-CM | POA: Diagnosis not present

## 2023-03-03 DIAGNOSIS — R771 Abnormality of globulin: Secondary | ICD-10-CM

## 2023-03-03 DIAGNOSIS — N1831 Chronic kidney disease, stage 3a: Secondary | ICD-10-CM

## 2023-03-03 DIAGNOSIS — C9000 Multiple myeloma not having achieved remission: Secondary | ICD-10-CM

## 2023-03-03 DIAGNOSIS — Z5111 Encounter for antineoplastic chemotherapy: Secondary | ICD-10-CM

## 2023-03-03 DIAGNOSIS — Z5112 Encounter for antineoplastic immunotherapy: Secondary | ICD-10-CM | POA: Diagnosis present

## 2023-03-03 DIAGNOSIS — C7951 Secondary malignant neoplasm of bone: Secondary | ICD-10-CM

## 2023-03-03 LAB — CBC WITH DIFFERENTIAL/PLATELET
Abs Immature Granulocytes: 0.02 10*3/uL (ref 0.00–0.07)
Basophils Absolute: 0 10*3/uL (ref 0.0–0.1)
Basophils Relative: 0 %
Eosinophils Absolute: 0.1 10*3/uL (ref 0.0–0.5)
Eosinophils Relative: 1 %
HCT: 39.8 % (ref 39.0–52.0)
Hemoglobin: 13.6 g/dL (ref 13.0–17.0)
Immature Granulocytes: 0 %
Lymphocytes Relative: 39 %
Lymphs Abs: 2.2 10*3/uL (ref 0.7–4.0)
MCH: 34.1 pg — ABNORMAL HIGH (ref 26.0–34.0)
MCHC: 34.2 g/dL (ref 30.0–36.0)
MCV: 99.7 fL (ref 80.0–100.0)
Monocytes Absolute: 0.5 10*3/uL (ref 0.1–1.0)
Monocytes Relative: 9 %
Neutro Abs: 2.8 10*3/uL (ref 1.7–7.7)
Neutrophils Relative %: 51 %
Platelets: 85 10*3/uL — ABNORMAL LOW (ref 150–400)
RBC: 3.99 MIL/uL — ABNORMAL LOW (ref 4.22–5.81)
RDW: 12.9 % (ref 11.5–15.5)
WBC: 5.6 10*3/uL (ref 4.0–10.5)
nRBC: 0 % (ref 0.0–0.2)

## 2023-03-03 LAB — COMPREHENSIVE METABOLIC PANEL
ALT: 24 U/L (ref 0–44)
AST: 25 U/L (ref 15–41)
Albumin: 4.3 g/dL (ref 3.5–5.0)
Alkaline Phosphatase: 60 U/L (ref 38–126)
Anion gap: 8 (ref 5–15)
BUN: 16 mg/dL (ref 8–23)
CO2: 24 mmol/L (ref 22–32)
Calcium: 9.1 mg/dL (ref 8.9–10.3)
Chloride: 106 mmol/L (ref 98–111)
Creatinine, Ser: 1.26 mg/dL — ABNORMAL HIGH (ref 0.61–1.24)
GFR, Estimated: >60 mL/min (ref >60)
Glucose, Bld: 108 mg/dL — ABNORMAL HIGH (ref 70–99)
Potassium: 3.9 mmol/L (ref 3.5–5.1)
Sodium: 138 mmol/L (ref 135–145)
Total Bilirubin: 0.6 mg/dL (ref 0.3–1.2)
Total Protein: 6.6 g/dL (ref 6.5–8.1)

## 2023-03-03 MED ORDER — DIPHENHYDRAMINE HCL 25 MG PO CAPS: 50.0000 mg | ORAL_CAPSULE | Freq: Once | ORAL | Status: DC

## 2023-03-03 MED ORDER — DARATUMUMAB-HYALURONIDASE-FIHJ 1800-30000 MG-UT/15ML ~~LOC~~ SOLN
1800.0000 mg | Freq: Once | SUBCUTANEOUS | Status: AC
  Administered 2023-03-03: 1800 mg via SUBCUTANEOUS
  Filled 2023-03-03: qty 15

## 2023-03-03 NOTE — Progress Notes (Signed)
Hematology/Oncology Progress note [phone] [phone]      REASON FOR VISIT Follow up for multiple myeloma.   ASSESSMENT & PLAN:   Multiple myeloma (HCC) Kappa Light chain multiple myeloma beta 2 microglobulin 5 and normal albumin.  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  06/04/2021 5% plasma cells -August 2022-February 2023, patient completed 7 cycles of daratumumab/Pomalyst/dexamethasone. 11/20/2021, status post bone marrow biopsy which showed 1% of plasma cells.  Likely he has minimal residual disease.  Currently on daratumumab maintenance. He takes his supply of dexamethasone 20mg  prior to his treatment.  Labs are reviewed and discussed with patient. light chain ratio is gradually increasing.  Today's level is pending.  Discussed the possibility of repeat bone marrow biopsy.  Will decide after today's level results. Proceed with daratumumab today .    Thrombocytopenia (HCC) Decreased, will monitor counts.   Encounter for antineoplastic chemotherapy Treatment plan as listed above  Stage 3a chronic kidney disease (HCC) Avoid nephrotoxin.  Encourage hydration.  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     No orders of the defined types were placed in this encounter.   Follow up in 4 weeks, Lab MD Daratumumab All questions were answered. The patient knows to call the clinic with any problems, questions or concerns.  [name], MD, PhD [facility] Hematology Oncology 03/03/2023   HISTORY OF PRESENTING ILLNESS:  Luis Burke is a  67 y.o.  male with PMH listed  below presents for follow up of multiple myeloma.  Oncology History  Multiple myeloma (HCC)  08/19/2018 Bone Marrow Biopsy   08/19/2018 bone marrow biopsy showed hypercellular marrow 80%, involved by plasma cell neoplasm up to 95%.  Consistent with plasma cell myeloma. Myeloma FISH  gain of gain of CEP12   08/25/2018 Imaging   PET scan showed no definite hypermetabolic bone disease but CT findings are highly suspicious for numerous small myelomatous lesions involving the spine, sternum and scattered ribs.   08/26/2018 Initial Diagnosis   Multiple myeloma  # 07/27/2018 multiple myeloma panel showed M protein of 0.1, IgG 662, IgA 49, IgM 7. 08/10/2018, free light chain ratio showed extremely high level of kappa free light chain 10,183, with a kappa lambda light chain ratio of 1414.31,LDH 164, Beta-2 microglobulin 5     08/28/2018 - 03/17/2019 Chemotherapy   RVD x 9     04/23/2019 Bone Marrow Transplant   autologous stem cell bone marrow transplant at [facility].  He received preparative regimen with melphalan 200 mg/m on 04/22/2019 followed by autologous stem cell infusion on 04/23/2019. Transplant course was complicated with febrile neutropenia grade 3, treated with vancomycin and cephapirin until engraftment on 05/06/2019. Patient also had engraftment syndrome and had to be started on Solu-Medrol 25 mg twice daily on 05/05/2019.  Transition to Medrol Dosepak on 05/07/2019 to complete steroid taper as outpatient.  re-vaccination 12 months post transplant   08/10/2019 - 05/2022 Chemotherapy    Ixazomib 3mg  on D1/D8/D15 out of 28 day cycle       07/12/2020 Imaging   CT skeletal survey done at [facility] 1.  Lucent lesion in the L4 spinous process measuring 4 mm which  is  nonspecific. Consider confirmation of marrow replacing process with MRI.  2.  Incompletely characterized renal cysts    07/28/2020 Bone Marrow Biopsy    bone marrow normocellular marrow with polytypic plasmacytosis, 3 to 8%   Patient is  in remission.  Cytogenetics negative for trisomy 12.  Negative myeloma FISH panel.   08/05/2020 Imaging   MRI lumbar spine with and without contrast was done at [facility] showed no evidence of spinal metastasis.  Lumbar spondylosis is most pronounced at L5-S1 where there is severe right and moderate left neural foraminal stenosis. 08/11/2020, x-ray cervical spine complete with flexion and extension showed The cervical spine is visualized from C1 to the top of T1 on the lateral  view.  Reversal of the normal cervical lordosis with a focal kyphosis centered at the C5-C6 intervertebral level. Trace anterolisthesis of C4 onto C5. Trace retrolisthesis of C6 on C7. No evidence of dynamic instability is noted on  the flexion or extension views.   No prevertebral soft tissue swelling. Cervical vertebral body heights are maintained. Multilevel degenerative changes of the cervical spine including discogenic disease, most pronounced at C5-C6 and C6-C7, and facet arthropathy most pronounced at C4-C5. Multilevel mild to moderate left neural foraminal osseous narrowing of the mid to lower cervical vertebrae, possibly centrally/artifactual by patient positioning.     05/15/2021 Bone Marrow Biopsy   Bone marrow biopsy showed slightly hypercellular bone marrow for age with slight plasmacytosis.  5% plasma cells with kappa light chain restriction.  Cytogenetics normal.  Myeloma FISH negative I discussed with [facility] Dr.Choi. we both feel that his bone marrow biopsy result reflects early relapse. Recommend stop Ixazomib. Switch to second line treatment with Daratumumab Pomlyst Dexamethasone   05/31/2021 - 05/20/2022 Chemotherapy   Patient is on Treatment Plan : MYELOMA Daratumumab + Pomalidomide + Dexamethasone q28d x 7 cycles     05/31/2021 -  Chemotherapy   Patient is on Treatment Plan : MYELOMA Daratumumab IV + Pomalidomide + Dexamethasone q28d x 7 cycles     09/21/2021 Imaging    PET scan showed no evidence of  suspicious or new hypermetabolism, scattered small lucent foci seen previously in the sternum and the spine are less prominent today and have resolved in some regions.  Aortic atherosclerosis.   11/18/2021 Imaging   MRI cervical spine with and without contrast showed no multiple myeloma lesion or marrow edema identified in the cervical spine.  Abnormal dural thickening and a/or abnormal epidural space at C4/C5 levels.  This is nonspecific.  ER physician discussed the case with neurosurgery Dr. Marcell Barlow who feels that this is more likely degenerative disc disease.   11/20/2021 Bone Marrow Biopsy   bone marrow biopsy which showed 1% of plasma cells. The plasma cells generally display polyclonal staining pattern for kappa and lambda light chains although a few very small clusters appear kappa light chain restricted.  The findings are very limited but most suggestive of minimal residual plasma cell neoplasm especially in the presence of monoclonal protein with kappa light chain specificity. Normal cytogenetics and myeloma FISH   12/20/2021 -  Chemotherapy   Daratumumab monthly maintenance  # I discussed with patient's [facility] oncologist Dr. Alvino Chapel.  We have agreed upon de-escalating his regimen to Daratumumab monthly maintenance for now. Discontinue pomalyst.     Metastasis to bone [facility])    September -Nov 2023, patient has had recurrent upper respiratory infection/bronchitis/sinusitis.  Patient was evaluated by ENT, he was diagnosed with allergic rhinitis, nasal congestion is due  to allergy and to hypertrophy.    INTERVAL HISTORY Luis Burke is a 67 y.o. male who has above history reviewed by me today for follow-up multiple myeloma.  Denies any nausea vomiting diarrhea today He has ongoing tooth pain at the site of tooth infection.  He has been to primary dentist, as well as endodontist for implant.  Pending additional evaluation if his pain is getting worse.  Patient plans to seek a second  opinion.   Review of Systems  Constitutional:  Negative for appetite change, chills, fatigue, fever and unexpected weight change.  HENT:   Negative for hearing loss.        Left lower tooth pain  Eyes:  Negative for eye problems and icterus.  Respiratory:  Negative for chest tightness, cough and shortness of breath.   Cardiovascular:  Negative for chest pain and leg swelling.  Gastrointestinal:  Negative for abdominal distention and abdominal pain.  Endocrine: Negative for hot flashes.  Genitourinary:  Negative for difficulty urinating, dysuria and frequency.   Musculoskeletal:  Positive for back pain. Negative for arthralgias.  Skin:  Negative for itching and rash.  Neurological:  Negative for light-headedness and numbness.  Hematological:  Negative for adenopathy. Does not bruise/bleed easily.  Psychiatric/Behavioral:  Negative for confusion.     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   Fatty liver    on u/s 07/2018   Foot fracture    Hyperlipidemia 03/2004   Hypertension 1994   Multiple myeloma (HCC)    Snores     SURGICAL HISTORY: Past Surgical History:  Procedure Laterality Date   CATARACT EXTRACTION Left 10/10/2021   CATARACT EXTRACTION W/PHACO Right 03/04/2018   Procedure: CATARACT EXTRACTION PHACO AND INTRAOCULAR LENS PLACEMENT (IOC)  RIGHT TORIC;  Surgeon: Lockie Mola, MD;  Location: [facility];  Service: Ophthalmology;  Laterality: Right;  Per [place] no Toric Lens 1:45 5.3   CATARACT EXTRACTION W/PHACO Left 10/10/2021   Procedure: CATARACT EXTRACTION PHACO AND INTRAOCULAR LENS PLACEMENT (IOC) LEFT;  Surgeon: Lockie Mola, MD;  Location: [facility];  Service: Ophthalmology;  Laterality: Left;  5.16 1:03.8   COLONOSCOPY WITH PROPOFOL N/A 07/20/2018   Procedure:  COLONOSCOPY WITH PROPOFOL;  Surgeon: Wyline Mood, MD;  Location: [facility];  Service: Gastroenterology;  Laterality: N/A;   ESOPHAGOGASTRODUODENOSCOPY (EGD) WITH PROPOFOL N/A 07/20/2018   Procedure: ESOPHAGOGASTRODUODENOSCOPY (EGD) WITH PROPOFOL;  Surgeon: Wyline Mood, MD;  Location: [facility] END[facility];  Service: Gastroenterology;  Laterality: N/A;   EYE SURGERY Right    HERNIA REPAIR     L Lap herniorraphy  04/2000   Left wrist ganglionectomy      SOCIAL HISTORY: Social History   Socioeconomic History   Marital status: Married    Spouse name: Not on file   Number of children: 1   Years of education: Not on file   Highest education level: Not on file  Occupational History   Occupation: [employer]  Tobacco Use   Smoking status: Never   Smokeless tobacco: Never   Tobacco comments:    Occassionally  Vaping Use   Vaping Use: Never used  Substance and Sexual Activity   Alcohol use: Not Currently    Alcohol/week: 3.0 standard drinks of alcohol    Types: 3 Cans of beer per week   Drug use: No  Sexual activity: Not on file  Other Topics Concern   Not on file  Social History Narrative   Divorced 2009, married 06-09-21.     His daughter died 4 days after giving birth to the patient's granddaughter   Has joint custody of his deceased daughter's child   Worked at [employer] is [place], retired 2020.     Social Determinants of Health   Financial Resource Strain: Not on file  Food Insecurity: Not on file  Transportation Needs: Not on file  Physical Activity: Not on file  Stress: Not on file  Social Connections: Not on file  Intimate Partner Violence: Not on file    FAMILY HISTORY: Family History  Problem Relation Age of Onset   Hypertension Mother    Diabetes Mother    Heart disease Mother        CAD   Dementia Mother    Prostate cancer Father    Hypertension Father    Heart disease Father        MI 02/03   Colon cancer Father    Hypertension Sister     Hypertension Sister    Hypertension Sister     ALLERGIES:  is allergic to bactrim [sulfamethoxazole-trimethoprim], clonidine derivatives, and tadalafil.  MEDICATIONS:  Current Outpatient Medications  Medication Sig Dispense Refill   acetaminophen (TYLENOL) 325 MG tablet Take 2 tablets (650 mg total) by mouth See admin instructions. 1 hour prior to chemotherapy treatments 30 tablet 0   acyclovir (ZOVIRAX) 400 MG tablet TAKE 1 TABLET(400 MG) BY MOUTH TWICE DAILY 60 tablet 2   amLODipine (NORVASC) 5 MG tablet Take 2 tablets (10 mg total) by mouth daily. 180 tablet 3   ASPIRIN 81 PO Take 81 mg by mouth daily.     B Complex Vitamins (VITAMIN B COMPLEX PO) Take 1 Dose by mouth daily.     co-enzyme Q-10 50 MG capsule Take 50 mg by mouth daily.     [employer] VITAMIN B12 1000 MCG tablet TAKE 1 TABLET BY MOUTH EVERY DAY 90 tablet 3   Denosumab (XGEVA [place]) Inject into the skin every 30 (thirty) days. Last rcvd 09/27/21     dexamethasone (DECADRON) 4 MG tablet Take 5 tablets (20 mg total) by mouth as directed. Take one hour before monthly Darzalex injection. 45 tablet 1   diphenhydrAMINE (BENADRYL) 50 MG tablet Take 1 tablet (50 mg total) by mouth See admin instructions. Take 1 tablet 1 hour prior to chemotherapy treatments. 30 tablet 0   hydrALAZINE (APRESOLINE) 10 MG tablet TAKE 1 TABLET(10 MG) BY MOUTH THREE TIMES DAILY 270 tablet 3   lisinopril (ZESTRIL) 20 MG tablet Take 1 tablet (20 mg total) by mouth daily. 90 tablet 3   magnesium chloride (SLOW-MAG) 64 MG TBEC SR tablet Take 1 tablet (64 mg total) by mouth daily. 60 tablet 0   metoprolol succinate (TOPROL-XL) 50 MG 24 hr tablet TAKE 4 TABLETS BY MOUTH EVERY DAY WITH OR IMMEDIATELY FOLLOWING A MEAL 360 tablet 3   montelukast (SINGULAIR) 10 MG tablet Take 1 tablet (10 mg total) by mouth See admin instructions. Take 1day prior to chemotherapy 30 tablet 1   pravastatin (PRAVACHOL) 10 MG tablet Take 1 tablet (10 mg total) by mouth daily. 90 tablet 3    prochlorperazine (COMPAZINE) 10 MG tablet      sildenafil (REVATIO) 20 MG tablet TAKE 1-5 TABLETS (20-100 MG TOTAL) BY MOUTH DAILY AS NEEDED 12 tablet 5   zinc gluconate 50 MG tablet Take 50 mg  by mouth daily.     No current facility-administered medications for this visit.   Facility-Administered Medications Ordered in Other Visits  Medication Dose Route Frequency Provider Last Rate Last Admin   0.9 %  sodium chloride infusion   Intravenous Once [name], MD         PHYSICAL EXAMINATION: ECOG PERFORMANCE STATUS: 1 - Symptomatic but completely ambulatory Vitals:   03/03/23 0901  BP: (!) 141/92  Pulse: 64  Resp: 18  Temp: 97.8 F (36.6 C)   Filed Weights   03/03/23 0901  Weight: 212 lb 9.6 oz (96.4 kg)    Physical Exam Constitutional:      General: He is not in acute distress. HENT:     Head: Normocephalic and atraumatic.  Eyes:     General: No scleral icterus. Cardiovascular:     Rate and Rhythm: Normal rate and regular rhythm.     Heart sounds: Normal heart sounds.  Pulmonary:     Effort: Pulmonary effort is normal. No respiratory distress.     Breath sounds: No wheezing.  Abdominal:     General: Bowel sounds are normal. There is no distension.     Palpations: Abdomen is soft.  Musculoskeletal:        General: No deformity. Normal range of motion.     Cervical back: Normal range of motion and neck supple.  Skin:    General: Skin is warm and dry.     Findings: No erythema or rash.  Neurological:     Mental Status: He is alert and oriented to person, place, and time. Mental status is at baseline.     Cranial Nerves: No cranial nerve deficit.     Coordination: Coordination normal.  Psychiatric:        Mood and Affect: Mood normal.      LABORATORY DATA:  I have reviewed the data as listed    Latest Ref Rng & Units 03/03/2023    8:45 AM 02/03/2023   12:42 PM 01/06/2023    9:47 AM  CBC  WBC 4.0 - 10.5 K/uL 5.6  6.3  5.8   Hemoglobin 13.0 - 17.0 g/dL 16.1   09.6  04.5   Hematocrit 39.0 - 52.0 % 39.8  42.9  42.3   Platelets 150 - 400 K/uL 85  100  100       Latest Ref Rng & Units 03/03/2023    8:45 AM 02/03/2023   12:42 PM 01/06/2023    9:47 AM  CMP  Glucose 70 - 99 mg/dL 409  811  914   BUN 8 - 23 mg/dL 16  21  21    Creatinine 0.61 - 1.24 mg/dL 7.82  9.56  2.13   Sodium 135 - 145 mmol/L 138  136  136   Potassium 3.5 - 5.1 mmol/L 3.9  3.6  3.7   Chloride 98 - 111 mmol/L 106  103  102   CO2 22 - 32 mmol/L 24  21  25    Calcium 8.9 - 10.3 mg/dL 9.1  9.5  9.5   Total Protein 6.5 - 8.1 g/dL 6.6  7.3  7.6   Total Bilirubin 0.3 - 1.2 mg/dL 0.6  0.5  0.6   Alkaline Phos 38 - 126 U/L 60  66  68   AST 15 - 41 U/L 25  21  25    ALT 0 - 44 U/L 24  18  28       Lab Results  Component Value Date  KPAFRELGTCHN 26.5 (H) 02/03/2023   LAMBDASER 7.7 02/03/2023   KAPLAMBRATIO 3.44 (H) 02/03/2023    RADIOGRAPHIC STUDIES: I have personally reviewed the radiological images as listed and agreed with the findings in the report. No results found.

## 2023-03-03 NOTE — Assessment & Plan Note (Signed)
Majority of his lucencies were not hypermetabolic on previous PET scan Xgeva monthly- hold Xgeva due to possible need of dental procedure Continue calcium and vitamin D supplementation.  

## 2023-03-03 NOTE — Assessment & Plan Note (Addendum)
Kappa Light chain multiple myeloma beta 2 microglobulin 5 and normal albumin.  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  06/04/2021 5% plasma cells -August 2022-February 2023, patient completed 7 cycles of daratumumab/Pomalyst/dexamethasone. 11/20/2021, status post bone marrow biopsy which showed 1% of plasma cells.  Likely he has minimal residual disease.  Currently on daratumumab maintenance. He takes his supply of dexamethasone 20mg  prior to his treatment.  Labs are reviewed and discussed with patient. light chain ratio is gradually increasing.  Today's level is pending.  Discussed the possibility of repeat bone marrow biopsy.  Will decide after today's level results. Proceed with daratumumab today .

## 2023-03-03 NOTE — Assessment & Plan Note (Signed)
Avoid nephrotoxin.  Encourage hydration. 

## 2023-03-03 NOTE — Progress Notes (Signed)
Patient here for follow up. Reports that he has been having intermittent sharp pain to tooth and dentist can't figure out what is causing it.

## 2023-03-03 NOTE — Assessment & Plan Note (Signed)
IVIG if IgG <400   

## 2023-03-03 NOTE — Assessment & Plan Note (Addendum)
Decreased, will monitor counts.

## 2023-03-03 NOTE — Progress Notes (Signed)
Patient has taken his pre-meds at home at 0845 (Benadryl and Dexamethasone).

## 2023-03-03 NOTE — Assessment & Plan Note (Signed)
Treatment plan as listed above. 

## 2023-03-04 LAB — KAPPA/LAMBDA LIGHT CHAINS
Kappa free light chain: 25.3 mg/L — ABNORMAL HIGH (ref 3.3–19.4)
Kappa, lambda light chain ratio: 2.81 — ABNORMAL HIGH (ref 0.26–1.65)
Lambda free light chains: 9 mg/L (ref 5.7–26.3)

## 2023-03-11 LAB — MULTIPLE MYELOMA PANEL, SERUM
Albumin SerPl Elph-Mcnc: 4.1 g/dL (ref 2.9–4.4)
Albumin/Glob SerPl: 2 — ABNORMAL HIGH (ref 0.7–1.7)
Alpha 1: 0.2 g/dL (ref 0.0–0.4)
Alpha2 Glob SerPl Elph-Mcnc: 0.7 g/dL (ref 0.4–1.0)
B-Globulin SerPl Elph-Mcnc: 0.9 g/dL (ref 0.7–1.3)
Gamma Glob SerPl Elph-Mcnc: 0.3 g/dL — ABNORMAL LOW (ref 0.4–1.8)
Globulin, Total: 2.1 g/dL — ABNORMAL LOW (ref 2.2–3.9)
IgA: 62 mg/dL (ref 61–437)
IgG (Immunoglobin G), Serum: 472 mg/dL — ABNORMAL LOW (ref 603–1613)
IgM (Immunoglobulin M), Srm: 14 mg/dL — ABNORMAL LOW (ref 20–172)
Total Protein ELP: 6.2 g/dL (ref 6.0–8.5)

## 2023-03-13 ENCOUNTER — Telehealth: Payer: Self-pay | Admitting: *Deleted

## 2023-03-13 NOTE — Telephone Encounter (Signed)
Patient called to report that his dentist has started him on 2 antibiotics from where his dental implants did not heal Clindamycin 300 doctor every 5 hours and Metronidazole 375 mg  2 tablets twice a day.Marland Kitchen He wanted Dr Cathie Hoops to know this incase she has any objections to them

## 2023-03-13 NOTE — Telephone Encounter (Signed)
Call returned back to patient and informed of doctor response

## 2023-03-31 ENCOUNTER — Inpatient Hospital Stay: Payer: Medicare Other

## 2023-03-31 ENCOUNTER — Inpatient Hospital Stay (HOSPITAL_BASED_OUTPATIENT_CLINIC_OR_DEPARTMENT_OTHER): Payer: Medicare Other | Admitting: Oncology

## 2023-03-31 ENCOUNTER — Inpatient Hospital Stay: Payer: Medicare Other | Attending: Oncology

## 2023-03-31 ENCOUNTER — Other Ambulatory Visit: Payer: Medicare Other

## 2023-03-31 ENCOUNTER — Encounter: Payer: Self-pay | Admitting: Oncology

## 2023-03-31 ENCOUNTER — Ambulatory Visit: Payer: Medicare Other

## 2023-03-31 ENCOUNTER — Ambulatory Visit: Payer: Medicare Other | Admitting: Oncology

## 2023-03-31 VITALS — BP 147/94 | HR 70 | Temp 97.4°F | Resp 18 | Wt 204.8 lb

## 2023-03-31 DIAGNOSIS — D696 Thrombocytopenia, unspecified: Secondary | ICD-10-CM | POA: Diagnosis not present

## 2023-03-31 DIAGNOSIS — Z5111 Encounter for antineoplastic chemotherapy: Secondary | ICD-10-CM | POA: Diagnosis not present

## 2023-03-31 DIAGNOSIS — Z5112 Encounter for antineoplastic immunotherapy: Secondary | ICD-10-CM | POA: Insufficient documentation

## 2023-03-31 DIAGNOSIS — C9000 Multiple myeloma not having achieved remission: Secondary | ICD-10-CM

## 2023-03-31 DIAGNOSIS — I129 Hypertensive chronic kidney disease with stage 1 through stage 4 chronic kidney disease, or unspecified chronic kidney disease: Secondary | ICD-10-CM | POA: Diagnosis not present

## 2023-03-31 DIAGNOSIS — N1831 Chronic kidney disease, stage 3a: Secondary | ICD-10-CM

## 2023-03-31 DIAGNOSIS — C7951 Secondary malignant neoplasm of bone: Secondary | ICD-10-CM

## 2023-03-31 LAB — CBC WITH DIFFERENTIAL/PLATELET
Abs Immature Granulocytes: 0.01 10*3/uL (ref 0.00–0.07)
Basophils Absolute: 0 10*3/uL (ref 0.0–0.1)
Basophils Relative: 0 %
Eosinophils Absolute: 0 10*3/uL (ref 0.0–0.5)
Eosinophils Relative: 1 %
HCT: 42.3 % (ref 39.0–52.0)
Hemoglobin: 14.7 g/dL (ref 13.0–17.0)
Immature Granulocytes: 0 %
Lymphocytes Relative: 29 %
Lymphs Abs: 1.6 10*3/uL (ref 0.7–4.0)
MCH: 34.2 pg — ABNORMAL HIGH (ref 26.0–34.0)
MCHC: 34.8 g/dL (ref 30.0–36.0)
MCV: 98.4 fL (ref 80.0–100.0)
Monocytes Absolute: 0.5 10*3/uL (ref 0.1–1.0)
Monocytes Relative: 9 %
Neutro Abs: 3.3 10*3/uL (ref 1.7–7.7)
Neutrophils Relative %: 61 %
Platelets: 88 10*3/uL — ABNORMAL LOW (ref 150–400)
RBC: 4.3 MIL/uL (ref 4.22–5.81)
RDW: 12.9 % (ref 11.5–15.5)
WBC: 5.4 10*3/uL (ref 4.0–10.5)
nRBC: 0 % (ref 0.0–0.2)

## 2023-03-31 LAB — COMPREHENSIVE METABOLIC PANEL
ALT: 24 U/L (ref 0–44)
AST: 21 U/L (ref 15–41)
Albumin: 4.5 g/dL (ref 3.5–5.0)
Alkaline Phosphatase: 62 U/L (ref 38–126)
Anion gap: 9 (ref 5–15)
BUN: 20 mg/dL (ref 8–23)
CO2: 24 mmol/L (ref 22–32)
Calcium: 9.6 mg/dL (ref 8.9–10.3)
Chloride: 103 mmol/L (ref 98–111)
Creatinine, Ser: 1.38 mg/dL — ABNORMAL HIGH (ref 0.61–1.24)
GFR, Estimated: 56 mL/min — ABNORMAL LOW (ref >60)
Glucose, Bld: 127 mg/dL — ABNORMAL HIGH (ref 70–99)
Potassium: 3.9 mmol/L (ref 3.5–5.1)
Sodium: 136 mmol/L (ref 135–145)
Total Bilirubin: 0.8 mg/dL (ref 0.3–1.2)
Total Protein: 6.9 g/dL (ref 6.5–8.1)

## 2023-03-31 MED ORDER — DIPHENHYDRAMINE HCL 25 MG PO CAPS
25.0000 mg | ORAL_CAPSULE | Freq: Once | ORAL | Status: AC
  Administered 2023-03-31: 25 mg via ORAL
  Filled 2023-03-31: qty 1

## 2023-03-31 MED ORDER — DARATUMUMAB-HYALURONIDASE-FIHJ 1800-30000 MG-UT/15ML ~~LOC~~ SOLN
1800.0000 mg | Freq: Once | SUBCUTANEOUS | Status: AC
  Administered 2023-03-31: 1800 mg via SUBCUTANEOUS
  Filled 2023-03-31: qty 15

## 2023-03-31 NOTE — Assessment & Plan Note (Signed)
Avoid nephrotoxin.  Encourage hydration. 

## 2023-03-31 NOTE — Assessment & Plan Note (Signed)
Treatment plan as listed above. 

## 2023-03-31 NOTE — Progress Notes (Signed)
Pt here for follow up. Pt states that appetite and sleep have been affected by personal life issues. Pt has completed a course of antibiotics for tooth infection and states that mouth feels a lot better. He was asked by dentist to hold [facility] today.Pt only took 25mg  of Benadryl this morning because he dropped the other pill in the car. He also reports that he felt some tingling to left foot last night, but it has not happened again.

## 2023-03-31 NOTE — Assessment & Plan Note (Addendum)
Kappa Light chain multiple myeloma beta 2 microglobulin 5 and normal albumin.  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  06/04/2021 5% plasma cells -August 2022-February 2023, patient completed 7 cycles of daratumumab/Pomalyst/dexamethasone. 11/20/2021, status post bone marrow biopsy which showed 1% of plasma cells.  Likely he has minimal residual disease.  Currently on daratumumab maintenance. He takes his supply of dexamethasone 20mg  prior to his treatment.  Labs are reviewed and discussed with patient.  Lab Results  Component Value Date   MPROTEIN Not Observed 03/03/2023   KPAFRELGTCHN 25.3 (H) 03/03/2023   LAMBDASER 9.0 03/03/2023   KAPLAMBRATIO 2.81 (H) 03/03/2023    Today's level is pending.    Proceed with daratumumab today .

## 2023-03-31 NOTE — Progress Notes (Signed)
Hematology/Oncology Progress note [phone] [phone]      REASON FOR VISIT Follow up for multiple myeloma.   ASSESSMENT & PLAN:   Multiple myeloma (HCC) Kappa Light chain multiple myeloma beta 2 microglobulin 5 and normal albumin.  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  06/04/2021 5% plasma cells -August 2022-February 2023, patient completed 7 cycles of daratumumab/Pomalyst/dexamethasone. 11/20/2021, status post bone marrow biopsy which showed 1% of plasma cells.  Likely he has minimal residual disease.  Currently on daratumumab maintenance. He takes his supply of dexamethasone 20mg  prior to his treatment.  Labs are reviewed and discussed with patient.  Lab Results  Component Value Date   MPROTEIN Not Observed 03/03/2023   KPAFRELGTCHN 25.3 (H) 03/03/2023   LAMBDASER 9.0 03/03/2023   KAPLAMBRATIO 2.81 (H) 03/03/2023    Today's level is pending.    Proceed with daratumumab today .    Thrombocytopenia (HCC) Decreased, will monitor counts.   Encounter for antineoplastic chemotherapy Treatment plan as listed above  Stage 3a chronic kidney disease (HCC) Avoid nephrotoxin.  Encourage hydration.  Metastasis to bone [facility]) Majority of his lucencies were not hypermetabolic on previous PET scan Xgeva monthly-  currently hold due to dental issue.  Continue calcium and vitamin D supplementation.     Orders Placed This Encounter  Procedures   Multiple Myeloma Panel (SPEP&IFE w/QIG)    Standing Status:   Future    Standing Expiration Date:   05/25/2024   Kappa/lambda light chains    Standing Status:   Future    Standing Expiration Date:   05/25/2024   Multiple Myeloma Panel (SPEP&IFE w/QIG)    Standing Status:   Future    Standing Expiration Date:   05/25/2024   Comprehensive  metabolic panel    Standing Status:   Future    Standing Expiration Date:   05/25/2024   CBC with Differential    Standing Status:   Future    Standing Expiration Date:   05/25/2024     Follow up in 4 weeks, Lab MD Daratumumab All questions were answered. The patient knows to call the clinic with any problems, questions or concerns.  [name], MD, PhD [facility] Hematology Oncology 03/31/2023   HISTORY OF PRESENTING ILLNESS:  Luis Burke is a  67 y.o.  male with PMH listed below presents for follow up of multiple myeloma.  Oncology History  Multiple myeloma (HCC)  08/19/2018 Bone Marrow Biopsy   08/19/2018 bone marrow biopsy showed hypercellular marrow 80%, involved by plasma cell neoplasm up to 95%.  Consistent with plasma cell myeloma. Myeloma FISH  gain of gain of CEP12   08/25/2018 Imaging   PET scan showed no definite hypermetabolic bone disease but CT findings are highly suspicious for numerous small myelomatous lesions involving the spine, sternum and scattered ribs.   08/26/2018 Initial Diagnosis   Multiple myeloma  # 07/27/2018 multiple myeloma panel showed M protein of 0.1, IgG 662, IgA 49, IgM 7. 08/10/2018, free light chain ratio showed extremely high level of kappa free light chain 10,183, with a kappa lambda light chain ratio of 1414.31,LDH 164, Beta-2 microglobulin 5     08/28/2018 - 03/17/2019 Chemotherapy   RVD x 9     04/23/2019 Bone Marrow Transplant   autologous stem cell bone marrow transplant at [facility].  He received preparative regimen with  melphalan 200 mg/m on 04/22/2019 followed by autologous stem cell infusion on 04/23/2019. Transplant course was complicated with febrile neutropenia grade 3, treated with vancomycin and cephapirin until engraftment on 05/06/2019. Patient also had engraftment syndrome and had to be started on Solu-Medrol 25 mg twice daily on 05/05/2019.  Transition to Medrol Dosepak on 05/07/2019 to complete steroid taper as outpatient.   re-vaccination 12 months post transplant   08/10/2019 - 05/2022 Chemotherapy    Ixazomib 3mg  on D1/D8/D15 out of 28 day cycle       07/12/2020 Imaging   CT skeletal survey done at [facility] 1.  Lucent lesion in the L4 spinous process measuring 4 mm which is  nonspecific. Consider confirmation of marrow replacing process with MRI.  2.  Incompletely characterized renal cysts    07/28/2020 Bone Marrow Biopsy    bone marrow normocellular marrow with polytypic plasmacytosis, 3 to 8%   Patient is in remission.  Cytogenetics negative for trisomy 12.  Negative myeloma FISH panel.   08/05/2020 Imaging   MRI lumbar spine with and without contrast was done at [facility] showed no evidence of spinal metastasis.  Lumbar spondylosis is most pronounced at L5-S1 where there is severe right and moderate left neural foraminal stenosis. 08/11/2020, x-ray cervical spine complete with flexion and extension showed The cervical spine is visualized from C1 to the top of T1 on the lateral  view.  Reversal of the normal cervical lordosis with a focal kyphosis centered at the C5-C6 intervertebral level. Trace anterolisthesis of C4 onto C5. Trace retrolisthesis of C6 on C7. No evidence of dynamic instability is noted on  the flexion or extension views.   No prevertebral soft tissue swelling. Cervical vertebral body heights are maintained. Multilevel degenerative changes of the cervical spine including discogenic disease, most pronounced at C5-C6 and C6-C7, and facet arthropathy most pronounced at C4-C5. Multilevel mild to moderate left neural foraminal osseous narrowing of the mid to lower cervical vertebrae, possibly centrally/artifactual by patient positioning.     05/15/2021 Bone Marrow Biopsy   Bone marrow biopsy showed slightly hypercellular bone marrow for age with slight plasmacytosis.  5% plasma cells with kappa light chain restriction.  Cytogenetics normal.  Myeloma FISH negative I discussed with [facility] Dr.Choi.  we both feel that his bone marrow biopsy result reflects early relapse. Recommend stop Ixazomib. Switch to second line treatment with Daratumumab Pomlyst Dexamethasone   05/31/2021 - 05/20/2022 Chemotherapy   Patient is on Treatment Plan : MYELOMA Daratumumab + Pomalidomide + Dexamethasone q28d x 7 cycles     05/31/2021 -  Chemotherapy   Patient is on Treatment Plan : MYELOMA Daratumumab IV + Pomalidomide + Dexamethasone q28d x 7 cycles     09/21/2021 Imaging    PET scan showed no evidence of suspicious or new hypermetabolism, scattered small lucent foci seen previously in the sternum and the spine are less prominent today and have resolved in some regions.  Aortic atherosclerosis.   11/18/2021 Imaging   MRI cervical spine with and without contrast showed no multiple myeloma lesion or marrow edema identified in the cervical spine.  Abnormal dural thickening and a/or abnormal epidural space at C4/C5 levels.  This is nonspecific.  ER physician discussed the case with neurosurgery Dr. Marcell Barlow who feels that this is more likely degenerative disc disease.   11/20/2021 Bone Marrow Biopsy   bone marrow biopsy which showed 1% of plasma cells. The plasma cells generally display polyclonal staining pattern for kappa and lambda light chains although a  few very small clusters appear kappa light chain restricted.  The findings are very limited but most suggestive of minimal residual plasma cell neoplasm especially in the presence of monoclonal protein with kappa light chain specificity. Normal cytogenetics and myeloma FISH   12/20/2021 -  Chemotherapy   Daratumumab monthly maintenance  # I discussed with patient's [facility] oncologist Dr. Alvino Chapel.  We have agreed upon de-escalating his regimen to Daratumumab monthly maintenance for now. Discontinue pomalyst.     Metastasis to bone [facility])    September -Nov 2023, patient has had recurrent upper respiratory infection/bronchitis/sinusitis.  Patient was evaluated by ENT,  he was diagnosed with allergic rhinitis, nasal congestion is due to allergy and to hypertrophy.    INTERVAL HISTORY Luis Burke is a 67 y.o. male who has above history reviewed by me today for follow-up multiple myeloma.  Denies any nausea vomiting diarrhea today He has ongoing tooth pain at the site of tooth infection.  He has been to endodontist, he was treated with additional antibiotics and symptoms have improved.  Recent life stressor as wife left him. His appetite and sleep have been affected. He has lost weight.    Review of Systems  Constitutional:  Negative for appetite change, chills, fatigue, fever and unexpected weight change.  HENT:   Negative for hearing loss.   Eyes:  Negative for eye problems and icterus.  Respiratory:  Negative for chest tightness, cough and shortness of breath.   Cardiovascular:  Negative for chest pain and leg swelling.  Gastrointestinal:  Negative for abdominal distention and abdominal pain.  Endocrine: Negative for hot flashes.  Genitourinary:  Negative for difficulty urinating, dysuria and frequency.   Musculoskeletal:  Positive for back pain. Negative for arthralgias.  Skin:  Negative for itching and rash.  Neurological:  Negative for light-headedness and numbness.  Hematological:  Negative for adenopathy. Does not bruise/bleed easily.  Psychiatric/Behavioral:  Negative for confusion.     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   Fatty liver    on u/s 07/2018   Foot fracture    Hyperlipidemia 03/2004   Hypertension 1994   Multiple myeloma (HCC)    Snores     SURGICAL HISTORY: Past Surgical History:  Procedure Laterality Date   CATARACT EXTRACTION Left 10/10/2021   CATARACT EXTRACTION W/PHACO Right 03/04/2018   Procedure: CATARACT EXTRACTION PHACO AND  INTRAOCULAR LENS PLACEMENT (IOC)  RIGHT TORIC;  Surgeon: Lockie Mola, MD;  Location: [facility];  Service: Ophthalmology;  Laterality: Right;  Per [place] no Toric Lens 1:45 5.3   CATARACT EXTRACTION W/PHACO Left 10/10/2021   Procedure: CATARACT EXTRACTION PHACO AND INTRAOCULAR LENS PLACEMENT (IOC) LEFT;  Surgeon: Lockie Mola, MD;  Location: [facility];  Service: Ophthalmology;  Laterality: Left;  5.16 1:03.8   COLONOSCOPY WITH PROPOFOL N/A 07/20/2018   Procedure: COLONOSCOPY WITH PROPOFOL;  Surgeon: Wyline Mood, MD;  Location: [facility] END[facility]COPY;  Service: Gastroenterology;  Laterality: N/A;   ESOPHAGOGASTRODUODENOSCOPY (EGD) WITH PROPOFOL N/A 07/20/2018   Procedure: ESOPHAGOGASTRODUODENOSCOPY (EGD) WITH PROPOFOL;  Surgeon: Wyline Mood, MD;  Location: [facility] ENDOSCOPY;  Service: Gastroenterology;  Laterality: N/A;   EYE SURGERY Right    HERNIA REPAIR     L Lap herniorraphy  04/2000   Left wrist ganglionectomy      SOCIAL HISTORY: Social History  Socioeconomic History   Marital status: Married    Spouse name: Not on file   Number of children: 1   Years of education: Not on file   Highest education level: Not on file  Occupational History   Occupation: [employer]  Tobacco Use   Smoking status: Never   Smokeless tobacco: Never   Tobacco comments:    Occassionally  Vaping Use   Vaping Use: Never used  Substance and Sexual Activity   Alcohol use: Not Currently    Alcohol/week: 3.0 standard drinks of alcohol    Types: 3 Cans of beer per week   Drug use: No   Sexual activity: Not on file  Other Topics Concern   Not on file  Social History Narrative   Divorced 2009, married 06-05-2021.     His daughter died 4 days after giving birth to the patient's granddaughter   Has joint custody of his deceased daughter's child   Worked at [employer] is [place], retired 2020.     Social Determinants of Health   Financial Resource Strain: Not on file   Food Insecurity: Not on file  Transportation Needs: Not on file  Physical Activity: Not on file  Stress: Not on file  Social Connections: Not on file  Intimate Partner Violence: Not on file    FAMILY HISTORY: Family History  Problem Relation Age of Onset   Hypertension Mother    Diabetes Mother    Heart disease Mother        CAD   Dementia Mother    Prostate cancer Father    Hypertension Father    Heart disease Father        MI 02/03   Colon cancer Father    Hypertension Sister    Hypertension Sister    Hypertension Sister     ALLERGIES:  is allergic to bactrim [sulfamethoxazole-trimethoprim], clonidine derivatives, and tadalafil.  MEDICATIONS:  Current Outpatient Medications  Medication Sig Dispense Refill   acetaminophen (TYLENOL) 325 MG tablet Take 2 tablets (650 mg total) by mouth See admin instructions. 1 hour prior to chemotherapy treatments 30 tablet 0   acyclovir (ZOVIRAX) 400 MG tablet TAKE 1 TABLET(400 MG) BY MOUTH TWICE DAILY 60 tablet 2   amLODipine (NORVASC) 5 MG tablet Take 2 tablets (10 mg total) by mouth daily. 180 tablet 3   ASPIRIN 81 PO Take 81 mg by mouth daily.     B Complex Vitamins (VITAMIN B COMPLEX PO) Take 1 Dose by mouth daily.     clindamycin (CLEOCIN) 300 MG capsule Take 300 mg by mouth every 6 (six) hours.     co-enzyme Q-10 50 MG capsule Take 50 mg by mouth daily.     [employer] VITAMIN B12 1000 MCG tablet TAKE 1 TABLET BY MOUTH EVERY DAY 90 tablet 3   Denosumab (XGEVA [place]) Inject into the skin every 30 (thirty) days. Last rcvd 09/27/21     dexamethasone (DECADRON) 4 MG tablet Take 5 tablets (20 mg total) by mouth as directed. Take one hour before monthly Darzalex injection. 45 tablet 1   diphenhydrAMINE (BENADRYL) 50 MG tablet Take 1 tablet (50 mg total) by mouth See admin instructions. Take 1 tablet 1 hour prior to chemotherapy treatments. 30 tablet 0   hydrALAZINE (APRESOLINE) 10 MG tablet TAKE 1 TABLET(10 MG) BY MOUTH THREE TIMES DAILY 270 tablet  3   lisinopril (ZESTRIL) 20 MG tablet Take 1 tablet (20 mg total) by mouth daily. 90 tablet 3   magnesium chloride (  SLOW-MAG) 64 MG TBEC SR tablet Take 1 tablet (64 mg total) by mouth daily. 60 tablet 0   metoprolol succinate (TOPROL-XL) 50 MG 24 hr tablet TAKE 4 TABLETS BY MOUTH EVERY DAY WITH OR IMMEDIATELY FOLLOWING A MEAL 360 tablet 3   metronidazole (FLAGYL) 375 MG capsule Take 750 mg by mouth 3 (three) times daily.     montelukast (SINGULAIR) 10 MG tablet Take 1 tablet (10 mg total) by mouth See admin instructions. Take 1day prior to chemotherapy 30 tablet 1   pravastatin (PRAVACHOL) 10 MG tablet Take 1 tablet (10 mg total) by mouth daily. 90 tablet 3   prochlorperazine (COMPAZINE) 10 MG tablet      sildenafil (REVATIO) 20 MG tablet TAKE 1-5 TABLETS (20-100 MG TOTAL) BY MOUTH DAILY AS NEEDED 12 tablet 5   zinc gluconate 50 MG tablet Take 50 mg by mouth daily.     No current facility-administered medications for this visit.   Facility-Administered Medications Ordered in Other Visits  Medication Dose Route Frequency Provider Last Rate Last Admin   0.9 %  sodium chloride infusion   Intravenous Once [name], MD       daratumumab-hyaluronidase-fihj [facility] FASPRO) 1800-30000 MG-UT/15ML chemo SQ injection 1,800 mg  1,800 mg Subcutaneous Once [name], MD         PHYSICAL EXAMINATION: ECOG PERFORMANCE STATUS: 1 - Symptomatic but completely ambulatory Vitals:   03/31/23 0846  BP: (!) 147/94  Pulse: 70  Resp: 18  Temp: (!) 97.4 F (36.3 C)   Filed Weights   03/31/23 0846  Weight: 204 lb 12.8 oz (92.9 kg)    Physical Exam Constitutional:      General: He is not in acute distress. HENT:     Head: Normocephalic and atraumatic.  Eyes:     General: No scleral icterus. Cardiovascular:     Rate and Rhythm: Normal rate and regular rhythm.     Heart sounds: Normal heart sounds.  Pulmonary:     Effort: Pulmonary effort is normal. No respiratory distress.     Breath sounds: No  wheezing.  Abdominal:     General: Bowel sounds are normal. There is no distension.     Palpations: Abdomen is soft.  Musculoskeletal:        General: No deformity. Normal range of motion.     Cervical back: Normal range of motion and neck supple.  Skin:    General: Skin is warm and dry.     Findings: No erythema or rash.  Neurological:     Mental Status: He is alert and oriented to person, place, and time. Mental status is at baseline.     Cranial Nerves: No cranial nerve deficit.     Coordination: Coordination normal.  Psychiatric:        Mood and Affect: Mood normal.      LABORATORY DATA:  I have reviewed the data as listed    Latest Ref Rng & Units 03/31/2023    8:15 AM 03/03/2023    8:45 AM 02/03/2023   12:42 PM  CBC  WBC 4.0 - 10.5 K/uL 5.4  5.6  6.3   Hemoglobin 13.0 - 17.0 g/dL 16.1  09.6  04.5   Hematocrit 39.0 - 52.0 % 42.3  39.8  42.9   Platelets 150 - 400 K/uL 88  85  100       Latest Ref Rng & Units 03/31/2023    8:15 AM 03/03/2023    8:45 AM 02/03/2023   12:42  PM  CMP  Glucose 70 - 99 mg/dL 161  096  045   BUN 8 - 23 mg/dL 20  16  21    Creatinine 0.61 - 1.24 mg/dL 4.09  8.11  9.14   Sodium 135 - 145 mmol/L 136  138  136   Potassium 3.5 - 5.1 mmol/L 3.9  3.9  3.6   Chloride 98 - 111 mmol/L 103  106  103   CO2 22 - 32 mmol/L 24  24  21    Calcium 8.9 - 10.3 mg/dL 9.6  9.1  9.5   Total Protein 6.5 - 8.1 g/dL 6.9  6.6  7.3   Total Bilirubin 0.3 - 1.2 mg/dL 0.8  0.6  0.5   Alkaline Phos 38 - 126 U/L 62  60  66   AST 15 - 41 U/L 21  25  21    ALT 0 - 44 U/L 24  24  18       Lab Results  Component Value Date   KPAFRELGTCHN 25.3 (H) 03/03/2023   LAMBDASER 9.0 03/03/2023   KAPLAMBRATIO 2.81 (H) 03/03/2023    RADIOGRAPHIC STUDIES: I have personally reviewed the radiological images as listed and agreed with the findings in the report. No results found.

## 2023-03-31 NOTE — Assessment & Plan Note (Addendum)
Majority of his lucencies were not hypermetabolic on previous PET scan Xgeva monthly-  currently hold due to dental issue.  Continue calcium and vitamin D supplementation.

## 2023-03-31 NOTE — Patient Instructions (Signed)
[facility] [place] [facility]  Discharge Instructions: Thank you for choosing [facility] to provide your oncology and hematology care.  If you have a lab appointment with the Cancer Center, please go directly to the Cancer Center and check in at the registration area.  Wear comfortable clothing and clothing appropriate for easy access to any Portacath or PICC line.   We strive to give you quality time with your provider. You may need to reschedule your appointment if you arrive late (15 or more minutes).  Arriving late affects you and other patients whose appointments are after yours.  Also, if you miss three or more appointments without notifying the office, you may be dismissed from the clinic at the provider's discretion.      For prescription refill requests, have your pharmacy contact our office and allow 72 hours for refills to be completed.    Today you received the following chemotherapy and/or immunotherapy agents Darzalex      To help prevent nausea and vomiting after your treatment, we encourage you to take your nausea medication as directed.  BELOW ARE SYMPTOMS THAT SHOULD BE REPORTED IMMEDIATELY: *FEVER GREATER THAN 100.4 F (38 C) OR HIGHER *CHILLS OR SWEATING *NAUSEA AND VOMITING THAT IS NOT CONTROLLED WITH YOUR NAUSEA MEDICATION *UNUSUAL SHORTNESS OF BREATH *UNUSUAL BRUISING OR BLEEDING *URINARY PROBLEMS (pain or burning when urinating, or frequent urination) *BOWEL PROBLEMS (unusual diarrhea, constipation, pain near the anus) TENDERNESS IN MOUTH AND THROAT WITH OR WITHOUT PRESENCE OF ULCERS (sore throat, sores in mouth, or a toothache) UNUSUAL RASH, SWELLING OR PAIN  UNUSUAL VAGINAL DISCHARGE OR ITCHING   Items with * indicate a potential emergency and should be followed up as soon as possible or go to the Emergency Department if any problems should occur.  Please show the CHEMOTHERAPY ALERT CARD or IMMUNOTHERAPY ALERT CARD at check-in to  the Emergency Department and triage nurse.  Should you have questions after your visit or need to cancel or reschedule your appointment, please contact [facility] [place] [facility]  [phone] and follow the prompts.  Office hours are 8:00 a.m. to 4:30 p.m. Monday - Friday. Please note that voicemails left after 4:00 p.m. may not be returned until the following business day.  We are closed weekends and major holidays. You have access to a nurse at all times for urgent questions. Please call the main number to the clinic [phone] and follow the prompts.  For any non-urgent questions, you may also contact your provider using MyChart. We now offer e-Visits for anyone 18 and older to request care online for non-urgent symptoms. For details visit [email or web address].   Also download the MyChart app! Go to the app store, search "MyChart", open the app, select [facility], and log in with your MyChart username and password.      

## 2023-03-31 NOTE — Assessment & Plan Note (Signed)
Decreased, will monitor counts.  

## 2023-04-01 LAB — KAPPA/LAMBDA LIGHT CHAINS
Kappa free light chain: 26.6 mg/L — ABNORMAL HIGH (ref 3.3–19.4)
Kappa, lambda light chain ratio: 3.69 — ABNORMAL HIGH (ref 0.26–1.65)
Lambda free light chains: 7.2 mg/L (ref 5.7–26.3)

## 2023-04-04 LAB — MULTIPLE MYELOMA PANEL, SERUM
Albumin SerPl Elph-Mcnc: 4.1 g/dL (ref 2.9–4.4)
Albumin/Glob SerPl: 2 — ABNORMAL HIGH (ref 0.7–1.7)
Alpha 1: 0.2 g/dL (ref 0.0–0.4)
Alpha2 Glob SerPl Elph-Mcnc: 0.7 g/dL (ref 0.4–1.0)
B-Globulin SerPl Elph-Mcnc: 0.9 g/dL (ref 0.7–1.3)
Gamma Glob SerPl Elph-Mcnc: 0.3 g/dL — ABNORMAL LOW (ref 0.4–1.8)
Globulin, Total: 2.1 g/dL — ABNORMAL LOW (ref 2.2–3.9)
IgA: 63 mg/dL (ref 61–437)
IgG (Immunoglobin G), Serum: 547 mg/dL — ABNORMAL LOW (ref 603–1613)
IgM (Immunoglobulin M), Srm: 21 mg/dL (ref 20–172)
Total Protein ELP: 6.2 g/dL (ref 6.0–8.5)

## 2023-04-28 ENCOUNTER — Inpatient Hospital Stay: Payer: Medicare Other | Admitting: Oncology

## 2023-04-28 ENCOUNTER — Encounter: Payer: Self-pay | Admitting: Oncology

## 2023-04-28 ENCOUNTER — Inpatient Hospital Stay: Payer: Medicare Other

## 2023-04-28 ENCOUNTER — Inpatient Hospital Stay: Payer: Medicare Other | Attending: Oncology

## 2023-04-28 VITALS — BP 119/91 | HR 73 | Temp 96.8°F | Resp 18 | Wt 201.8 lb

## 2023-04-28 VITALS — BP 145/96 | HR 79 | Temp 97.5°F | Resp 18

## 2023-04-28 DIAGNOSIS — C9000 Multiple myeloma not having achieved remission: Secondary | ICD-10-CM

## 2023-04-28 DIAGNOSIS — C7951 Secondary malignant neoplasm of bone: Secondary | ICD-10-CM

## 2023-04-28 DIAGNOSIS — D696 Thrombocytopenia, unspecified: Secondary | ICD-10-CM

## 2023-04-28 DIAGNOSIS — N1831 Chronic kidney disease, stage 3a: Secondary | ICD-10-CM | POA: Diagnosis not present

## 2023-04-28 DIAGNOSIS — D229 Melanocytic nevi, unspecified: Secondary | ICD-10-CM

## 2023-04-28 DIAGNOSIS — Z5111 Encounter for antineoplastic chemotherapy: Secondary | ICD-10-CM

## 2023-04-28 DIAGNOSIS — Z5112 Encounter for antineoplastic immunotherapy: Secondary | ICD-10-CM | POA: Insufficient documentation

## 2023-04-28 LAB — COMPREHENSIVE METABOLIC PANEL
ALT: 18 U/L (ref 0–44)
AST: 18 U/L (ref 15–41)
Albumin: 4.6 g/dL (ref 3.5–5.0)
Alkaline Phosphatase: 68 U/L (ref 38–126)
Anion gap: 10 (ref 5–15)
BUN: 20 mg/dL (ref 8–23)
CO2: 22 mmol/L (ref 22–32)
Calcium: 9.5 mg/dL (ref 8.9–10.3)
Chloride: 103 mmol/L (ref 98–111)
Creatinine, Ser: 1.43 mg/dL — ABNORMAL HIGH (ref 0.61–1.24)
GFR, Estimated: 54 mL/min — ABNORMAL LOW (ref >60)
Glucose, Bld: 118 mg/dL — ABNORMAL HIGH (ref 70–99)
Potassium: 3.8 mmol/L (ref 3.5–5.1)
Sodium: 135 mmol/L (ref 135–145)
Total Bilirubin: 0.4 mg/dL (ref 0.3–1.2)
Total Protein: 7.2 g/dL (ref 6.5–8.1)

## 2023-04-28 LAB — CBC WITH DIFFERENTIAL/PLATELET
Abs Immature Granulocytes: 0.02 10*3/uL (ref 0.00–0.07)
Basophils Absolute: 0 10*3/uL (ref 0.0–0.1)
Basophils Relative: 0 %
Eosinophils Absolute: 0 10*3/uL (ref 0.0–0.5)
Eosinophils Relative: 0 %
HCT: 43.2 % (ref 39.0–52.0)
Hemoglobin: 15.1 g/dL (ref 13.0–17.0)
Immature Granulocytes: 0 %
Lymphocytes Relative: 32 %
Lymphs Abs: 1.9 10*3/uL (ref 0.7–4.0)
MCH: 34 pg (ref 26.0–34.0)
MCHC: 35 g/dL (ref 30.0–36.0)
MCV: 97.3 fL (ref 80.0–100.0)
Monocytes Absolute: 0.4 10*3/uL (ref 0.1–1.0)
Monocytes Relative: 7 %
Neutro Abs: 3.5 10*3/uL (ref 1.7–7.7)
Neutrophils Relative %: 61 %
Platelets: 87 10*3/uL — ABNORMAL LOW (ref 150–400)
RBC: 4.44 MIL/uL (ref 4.22–5.81)
RDW: 12.8 % (ref 11.5–15.5)
WBC: 5.8 10*3/uL (ref 4.0–10.5)
nRBC: 0 % (ref 0.0–0.2)

## 2023-04-28 MED ORDER — DARATUMUMAB-HYALURONIDASE-FIHJ 1800-30000 MG-UT/15ML ~~LOC~~ SOLN
1800.0000 mg | Freq: Once | SUBCUTANEOUS | Status: AC
  Administered 2023-04-28: 1800 mg via SUBCUTANEOUS
  Filled 2023-04-28: qty 15

## 2023-04-28 NOTE — Assessment & Plan Note (Signed)
Avoid nephrotoxin.  Encourage hydration. 

## 2023-04-28 NOTE — Progress Notes (Signed)
Pt here for follow up. No new heme concerns voiced. Per pt, oral surgeon would like to hold off Xgeva for about another month. Pt reports that he has some spots on his back and would like to know if he needs to see dermatology.

## 2023-04-28 NOTE — Assessment & Plan Note (Signed)
Decreased, will monitor counts.

## 2023-04-28 NOTE — Progress Notes (Signed)
Hematology/Oncology Progress note [phone] [phone]      REASON FOR VISIT Follow up for multiple myeloma.   ASSESSMENT & PLAN:   Multiple myeloma (HCC) Kappa Light chain multiple myeloma beta 2 microglobulin 5 and normal albumin.  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  06/04/2021 5% plasma cells -August 2022-February 2023, patient completed 7 cycles of daratumumab/Pomalyst/dexamethasone. 11/20/2021, status post bone marrow biopsy which showed 1% of plasma cells.  Likely he has minimal residual disease.  Currently on daratumumab maintenance. He takes his supply of dexamethasone 20mg  prior to his treatment.  Labs are reviewed and discussed with patient.  Lab Results  Component Value Date   MPROTEIN Not Observed 03/31/2023   KPAFRELGTCHN 26.6 (H) 03/31/2023   LAMBDASER 7.2 03/31/2023   KAPLAMBRATIO 3.69 (H) 03/31/2023    Today's level is pending.    Proceed with daratumumab today .  Repeat bone marrow biopsy.    Thrombocytopenia (HCC) Decreased, will monitor counts.   Encounter for antineoplastic chemotherapy Treatment plan as listed above  Stage 3a chronic kidney disease (HCC) Avoid nephrotoxin.  Encourage hydration.  Metastasis to bone [facility]) Majority of his lucencies were not hypermetabolic on previous PET scan Xgeva monthly-  currently hold due to dental issue.  Continue calcium and vitamin D supplementation.   Enlarged skin mole Recommend patient to seek dermatology evaluation.     Orders Placed This Encounter  Procedures   IR BONE MARROW BIOPSY & ASPIRATION    Standing Status:   Future    Standing Expiration Date:   04/27/2024    Order Specific Question:   Reason for Exam (SYMPTOM  OR DIAGNOSIS REQUIRED)    Answer:   Multiple myeloma    Order Specific  Question:   Preferred Imaging Location?    Answer:   [facility]     Follow up in 4 weeks, Lab MD Daratumumab All questions were answered. The patient knows to call the clinic with any problems, questions or concerns.  [name], MD, PhD [facility] Hematology Oncology 04/28/2023   HISTORY OF PRESENTING ILLNESS:  Luis Burke is a  67 y.o.  male with PMH listed below presents for follow up of multiple myeloma.  Oncology History  Multiple myeloma (HCC)  08/19/2018 Bone Marrow Biopsy   08/19/2018 bone marrow biopsy showed hypercellular marrow 80%, involved by plasma cell neoplasm up to 95%.  Consistent with plasma cell myeloma. Myeloma FISH  gain of gain of CEP12   08/25/2018 Imaging   PET scan showed no definite hypermetabolic bone disease but CT findings are highly suspicious for numerous small myelomatous lesions involving the spine, sternum and scattered ribs.   08/26/2018 Initial Diagnosis   Multiple myeloma  # 07/27/2018 multiple myeloma panel showed M protein of 0.1, IgG 662, IgA 49, IgM 7. 08/10/2018, free light chain ratio showed extremely high level of kappa free light chain 10,183, with a kappa lambda light chain ratio of 1414.31,LDH 164, Beta-2 microglobulin 5     08/28/2018 - 03/17/2019 Chemotherapy   RVD x 9     04/23/2019 Bone Marrow Transplant   autologous stem cell bone marrow transplant at [facility].  He received preparative regimen with melphalan 200 mg/m on 04/22/2019 followed by autologous stem cell infusion on 04/23/2019. Transplant course was complicated with febrile neutropenia grade 3, treated with vancomycin and cephapirin until engraftment on  05/06/2019. Patient also had engraftment syndrome and had to be started on Solu-Medrol 25 mg twice daily on 05/05/2019.  Transition to Medrol Dosepak on 05/07/2019 to complete steroid taper as outpatient.  re-vaccination 12 months post transplant   08/10/2019 - 05/2022 Chemotherapy    Ixazomib 3mg  on D1/D8/D15 out of 28  day cycle       07/12/2020 Imaging   CT skeletal survey done at [facility] 1.  Lucent lesion in the L4 spinous process measuring 4 mm which is  nonspecific. Consider confirmation of marrow replacing process with MRI.  2.  Incompletely characterized renal cysts    07/28/2020 Bone Marrow Biopsy    bone marrow normocellular marrow with polytypic plasmacytosis, 3 to 8%   Patient is in remission.  Cytogenetics negative for trisomy 12.  Negative myeloma FISH panel.   08/05/2020 Imaging   MRI lumbar spine with and without contrast was done at [facility] showed no evidence of spinal metastasis.  Lumbar spondylosis is most pronounced at L5-S1 where there is severe right and moderate left neural foraminal stenosis. 08/11/2020, x-ray cervical spine complete with flexion and extension showed The cervical spine is visualized from C1 to the top of T1 on the lateral  view.  Reversal of the normal cervical lordosis with a focal kyphosis centered at the C5-C6 intervertebral level. Trace anterolisthesis of C4 onto C5. Trace retrolisthesis of C6 on C7. No evidence of dynamic instability is noted on  the flexion or extension views.   No prevertebral soft tissue swelling. Cervical vertebral body heights are maintained. Multilevel degenerative changes of the cervical spine including discogenic disease, most pronounced at C5-C6 and C6-C7, and facet arthropathy most pronounced at C4-C5. Multilevel mild to moderate left neural foraminal osseous narrowing of the mid to lower cervical vertebrae, possibly centrally/artifactual by patient positioning.     05/15/2021 Bone Marrow Biopsy   Bone marrow biopsy showed slightly hypercellular bone marrow for age with slight plasmacytosis.  5% plasma cells with kappa light chain restriction.  Cytogenetics normal.  Myeloma FISH negative I discussed with [facility] Dr.Choi. we both feel that his bone marrow biopsy result reflects early relapse. Recommend stop Ixazomib. Switch to second line  treatment with Daratumumab Pomlyst Dexamethasone   05/31/2021 - 05/20/2022 Chemotherapy   Patient is on Treatment Plan : MYELOMA Daratumumab + Pomalidomide + Dexamethasone q28d x 7 cycles     05/31/2021 -  Chemotherapy   Patient is on Treatment Plan : MYELOMA Daratumumab IV + Pomalidomide + Dexamethasone q28d x 7 cycles     09/21/2021 Imaging    PET scan showed no evidence of suspicious or new hypermetabolism, scattered small lucent foci seen previously in the sternum and the spine are less prominent today and have resolved in some regions.  Aortic atherosclerosis.   11/18/2021 Imaging   MRI cervical spine with and without contrast showed no multiple myeloma lesion or marrow edema identified in the cervical spine.  Abnormal dural thickening and a/or abnormal epidural space at C4/C5 levels.  This is nonspecific.  ER physician discussed the case with neurosurgery Dr. Marcell Barlow who feels that this is more likely degenerative disc disease.   11/20/2021 Bone Marrow Biopsy   bone marrow biopsy which showed 1% of plasma cells. The plasma cells generally display polyclonal staining pattern for kappa and lambda light chains although a few very small clusters appear kappa light chain restricted.  The findings are very limited but most suggestive of minimal residual plasma cell neoplasm especially in the presence of monoclonal  protein with kappa light chain specificity. Normal cytogenetics and myeloma FISH   12/20/2021 -  Chemotherapy   Daratumumab monthly maintenance  # I discussed with patient's [facility] oncologist Dr. Alvino Chapel.  We have agreed upon de-escalating his regimen to Daratumumab monthly maintenance for now. Discontinue pomalyst.     Metastasis to bone [facility])    September -Nov 2023, patient has had recurrent upper respiratory infection/bronchitis/sinusitis.  Patient was evaluated by ENT, he was diagnosed with allergic rhinitis, nasal congestion is due to allergy and to hypertrophy.    INTERVAL  HISTORY TRELYN VANDERLINDE is a 67 y.o. male who has above history reviewed by me today for follow-up multiple myeloma.  Denies any nausea vomiting diarrhea today His wife noticed a few area of concern on his back and he would like to check.    Review of Systems  Constitutional:  Negative for appetite change, chills, fatigue, fever and unexpected weight change.  HENT:   Negative for hearing loss.   Eyes:  Negative for eye problems and icterus.  Respiratory:  Negative for chest tightness, cough and shortness of breath.   Cardiovascular:  Negative for chest pain and leg swelling.  Gastrointestinal:  Negative for abdominal distention and abdominal pain.  Endocrine: Negative for hot flashes.  Genitourinary:  Negative for difficulty urinating, dysuria and frequency.   Musculoskeletal:  Positive for back pain. Negative for arthralgias.  Skin:  Negative for itching and rash.       He has a few moles on his back.   Neurological:  Negative for light-headedness and numbness.  Hematological:  Negative for adenopathy. Does not bruise/bleed easily.  Psychiatric/Behavioral:  Negative for confusion.     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   Fatty liver    on u/s 07/2018   Foot fracture    Hyperlipidemia 03/2004   Hypertension 1994   Multiple myeloma (HCC)    Snores     SURGICAL HISTORY: Past Surgical History:  Procedure Laterality Date   CATARACT EXTRACTION Left 10/10/2021   CATARACT EXTRACTION W/PHACO Right 03/04/2018   Procedure: CATARACT EXTRACTION PHACO AND INTRAOCULAR LENS PLACEMENT (IOC)  RIGHT TORIC;  Surgeon: Lockie Mola, MD;  Location: [facility];  Service: Ophthalmology;  Laterality: Right;  [place] no Toric Lens 1:45 5.3   CATARACT EXTRACTION W/PHACO Left 10/10/2021   Procedure: CATARACT  EXTRACTION PHACO AND INTRAOCULAR LENS PLACEMENT (IOC) LEFT;  Surgeon: Lockie Mola, MD;  Location: [facility];  Service: Ophthalmology;  Laterality: Left;  5.16 1:03.8   COLONOSCOPY WITH PROPOFOL N/A 07/20/2018   Procedure: COLONOSCOPY WITH PROPOFOL;  Surgeon: Wyline Mood, MD;  Location: [facility] END[facility]COPY;  Service: Gastroenterology;  Laterality: N/A;   ESOPHAGOGASTRODUODENOSCOPY (EGD) WITH PROPOFOL N/A 07/20/2018   Procedure: ESOPHAGOGASTRODUODENOSCOPY (EGD) WITH PROPOFOL;  Surgeon: Wyline Mood, MD;  Location: [facility] ENDOSCOPY;  Service: Gastroenterology;  Laterality: N/A;   EYE SURGERY Right    HERNIA REPAIR     L Lap herniorraphy  04/2000   Left wrist ganglionectomy      SOCIAL HISTORY: Social History   Socioeconomic History   Marital status: Married    Spouse name: Not on file   Number of children: 1   Years of education: Not on file   Highest education level: Not on file  Occupational History   Occupation: [employer]  Tobacco Use   Smoking status: Never   Smokeless tobacco: Never   Tobacco comments:    Occassionally  Vaping Use   Vaping status: Never Used  Substance and Sexual Activity   Alcohol use: Not Currently    Alcohol/week: 3.0 standard drinks of alcohol    Types: 3 Cans of beer per week   Drug use: No   Sexual activity: Not on file  Other Topics Concern   Not on file  Social History Narrative   Divorced 2009, married May 31, 2021.     His daughter died 4 days after giving birth to the patient's granddaughter   Has joint custody of his deceased daughter's child   Worked at [employer] is [place], retired 2020.     Social Determinants of Health   Financial Resource Strain: Not on file  Food Insecurity: Not on file  Transportation Needs: Not on file  Physical Activity: Not on file  Stress: Not on file  Social Connections: Not on file  Intimate Partner Violence: Not on file    FAMILY HISTORY: Family History  Problem Relation Age of  Onset   Hypertension Mother    Diabetes Mother    Heart disease Mother        CAD   Dementia Mother    Prostate cancer Father    Hypertension Father    Heart disease Father        MI 02/03   Colon cancer Father    Hypertension Sister    Hypertension Sister    Hypertension Sister     ALLERGIES:  is allergic to bactrim [sulfamethoxazole-trimethoprim], clonidine derivatives, and tadalafil.  MEDICATIONS:  Current Outpatient Medications  Medication Sig Dispense Refill   acetaminophen (TYLENOL) 325 MG tablet Take 2 tablets (650 mg total) by mouth See admin instructions. 1 hour prior to chemotherapy treatments 30 tablet 0   acyclovir (ZOVIRAX) 400 MG tablet TAKE 1 TABLET(400 MG) BY MOUTH TWICE DAILY 60 tablet 2   amLODipine (NORVASC) 5 MG tablet Take 2 tablets (10 mg total) by mouth daily. 180 tablet 3   ASPIRIN 81 PO Take 81 mg by mouth daily.     B Complex Vitamins (VITAMIN B COMPLEX PO) Take 1 Dose by mouth daily.     clindamycin (CLEOCIN) 300 MG capsule Take 300 mg by mouth every 6 (six) hours.     co-enzyme Q-10 50 MG capsule Take 50 mg by mouth daily.     [employer] VITAMIN B12 1000 MCG tablet TAKE 1 TABLET BY MOUTH EVERY DAY 90 tablet 3   dexamethasone (DECADRON) 4 MG tablet Take 5 tablets (20 mg total) by mouth as directed. Take one hour before monthly Darzalex injection. 45 tablet 1   diphenhydrAMINE (BENADRYL) 50 MG tablet Take 1 tablet (50 mg total) by mouth See admin instructions. Take 1 tablet 1 hour prior to chemotherapy treatments. 30 tablet 0   hydrALAZINE (APRESOLINE) 10 MG tablet TAKE 1 TABLET(10 MG) BY MOUTH THREE TIMES DAILY 270 tablet 3   lisinopril (ZESTRIL) 20 MG tablet Take 1 tablet (20 mg total) by mouth daily. 90 tablet 3   magnesium chloride (SLOW-MAG) 64 MG TBEC SR tablet Take 1 tablet (64 mg total) by mouth daily. 60 tablet 0   metoprolol succinate (TOPROL-XL) 50 MG 24 hr tablet TAKE 4 TABLETS BY MOUTH EVERY DAY WITH OR IMMEDIATELY FOLLOWING A MEAL 360 tablet 3    metronidazole (FLAGYL) 375 MG capsule Take 750 mg by mouth 3 (three) times daily.  montelukast (SINGULAIR) 10 MG tablet Take 1 tablet (10 mg total) by mouth See admin instructions. Take 1day prior to chemotherapy 30 tablet 1   pravastatin (PRAVACHOL) 10 MG tablet Take 1 tablet (10 mg total) by mouth daily. 90 tablet 3   prochlorperazine (COMPAZINE) 10 MG tablet      sildenafil (REVATIO) 20 MG tablet TAKE 1-5 TABLETS (20-100 MG TOTAL) BY MOUTH DAILY AS NEEDED 12 tablet 5   zinc gluconate 50 MG tablet Take 50 mg by mouth daily.     Denosumab (XGEVA [place]) Inject into the skin every 30 (thirty) days. Last rcvd 09/27/21 (Patient not taking: Reported on 04/28/2023)     No current facility-administered medications for this visit.   Facility-Administered Medications Ordered in Other Visits  Medication Dose Route Frequency Provider Last Rate Last Admin   0.9 %  sodium chloride infusion   Intravenous Once [name], MD         PHYSICAL EXAMINATION: ECOG PERFORMANCE STATUS: 1 - Symptomatic but completely ambulatory Vitals:   04/28/23 0847  BP: (!) 119/91  Pulse: 73  Resp: 18  Temp: (!) 96.8 F (36 C)   Filed Weights   04/28/23 0847  Weight: 201 lb 12.8 oz (91.5 kg)    Physical Exam Constitutional:      General: He is not in acute distress. HENT:     Head: Normocephalic and atraumatic.  Eyes:     General: No scleral icterus. Cardiovascular:     Rate and Rhythm: Normal rate and regular rhythm.     Heart sounds: Normal heart sounds.  Pulmonary:     Effort: Pulmonary effort is normal. No respiratory distress.     Breath sounds: No wheezing.  Abdominal:     General: Bowel sounds are normal. There is no distension.     Palpations: Abdomen is soft.  Musculoskeletal:        General: No deformity. Normal range of motion.     Cervical back: Normal range of motion and neck supple.  Skin:    General: Skin is warm and dry.     Findings: No erythema or rash.  Neurological:     Mental  Status: He is alert and oriented to person, place, and time. Mental status is at baseline.     Cranial Nerves: No cranial nerve deficit.     Coordination: Coordination normal.  Psychiatric:        Mood and Affect: Mood normal.      LABORATORY DATA:  I have reviewed the data as listed    Latest Ref Rng & Units 04/28/2023    8:13 AM 03/31/2023    8:15 AM 03/03/2023    8:45 AM  CBC  WBC 4.0 - 10.5 K/uL 5.8  5.4  5.6   Hemoglobin 13.0 - 17.0 g/dL 40.9  81.1  91.4   Hematocrit 39.0 - 52.0 % 43.2  42.3  39.8   Platelets 150 - 400 K/uL 87  88  85       Latest Ref Rng & Units 03/31/2023    8:15 AM 03/03/2023    8:45 AM 02/03/2023   12:42 PM  CMP  Glucose 70 - 99 mg/dL 782  956  213   BUN 8 - 23 mg/dL 20  16  21    Creatinine 0.61 - 1.24 mg/dL 0.86  5.78  4.69   Sodium 135 - 145 mmol/L 136  138  136   Potassium 3.5 - 5.1 mmol/L 3.9  3.9  3.6  Chloride 98 - 111 mmol/L 103  106  103   CO2 22 - 32 mmol/L 24  24  21    Calcium 8.9 - 10.3 mg/dL 9.6  9.1  9.5   Total Protein 6.5 - 8.1 g/dL 6.9  6.6  7.3   Total Bilirubin 0.3 - 1.2 mg/dL 0.8  0.6  0.5   Alkaline Phos 38 - 126 U/L 62  60  66   AST 15 - 41 U/L 21  25  21    ALT 0 - 44 U/L 24  24  18       Lab Results  Component Value Date   KPAFRELGTCHN 26.6 (H) 03/31/2023   LAMBDASER 7.2 03/31/2023   KAPLAMBRATIO 3.69 (H) 03/31/2023    RADIOGRAPHIC STUDIES: I have personally reviewed the radiological images as listed and agreed with the findings in the report. No results found.

## 2023-04-28 NOTE — Assessment & Plan Note (Addendum)
Kappa Light chain multiple myeloma beta 2 microglobulin 5 and normal albumin.  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  06/04/2021 5% plasma cells -August 2022-February 2023, patient completed 7 cycles of daratumumab/Pomalyst/dexamethasone. 11/20/2021, status post bone marrow biopsy which showed 1% of plasma cells.  Likely he has minimal residual disease.  Currently on daratumumab maintenance. He takes his supply of dexamethasone 20mg  prior to his treatment.  Labs are reviewed and discussed with patient.  Lab Results  Component Value Date   MPROTEIN Not Observed 03/31/2023   KPAFRELGTCHN 26.6 (H) 03/31/2023   LAMBDASER 7.2 03/31/2023   KAPLAMBRATIO 3.69 (H) 03/31/2023    Today's level is pending.    Proceed with daratumumab today .  Repeat bone marrow biopsy.

## 2023-04-28 NOTE — Patient Instructions (Addendum)
[facility] [place] [facility]  Discharge Instructions: Thank you for choosing [facility] to provide your oncology and hematology care.  If you have a lab appointment with the Cancer Center, please go directly to the Cancer Center and check in at the registration area.  Wear comfortable clothing and clothing appropriate for easy access to any Portacath or PICC line.   We strive to give you quality time with your provider. You may need to reschedule your appointment if you arrive late (15 or more minutes).  Arriving late affects you and other patients whose appointments are after yours.  Also, if you miss three or more appointments without notifying the office, you may be dismissed from the clinic at the provider's discretion.      For prescription refill requests, have your pharmacy contact our office and allow 72 hours for refills to be completed.    Today you received the following chemotherapy and/or immunotherapy agents darzalex    To help prevent nausea and vomiting after your treatment, we encourage you to take your nausea medication as directed.  BELOW ARE SYMPTOMS THAT SHOULD BE REPORTED IMMEDIATELY: *FEVER GREATER THAN 100.4 F (38 C) OR HIGHER *CHILLS OR SWEATING *NAUSEA AND VOMITING THAT IS NOT CONTROLLED WITH YOUR NAUSEA MEDICATION *UNUSUAL SHORTNESS OF BREATH *UNUSUAL BRUISING OR BLEEDING *URINARY PROBLEMS (pain or burning when urinating, or frequent urination) *BOWEL PROBLEMS (unusual diarrhea, constipation, pain near the anus) TENDERNESS IN MOUTH AND THROAT WITH OR WITHOUT PRESENCE OF ULCERS (sore throat, sores in mouth, or a toothache) UNUSUAL RASH, SWELLING OR PAIN  UNUSUAL VAGINAL DISCHARGE OR ITCHING   Items with * indicate a potential emergency and should be followed up as soon as possible or go to the Emergency Department if any problems should occur.  Please show the CHEMOTHERAPY ALERT CARD or IMMUNOTHERAPY ALERT CARD at check-in to  the Emergency Department and triage nurse.  Should you have questions after your visit or need to cancel or reschedule your appointment, please contact [facility] [place] [facility]  [phone] and follow the prompts.  Office hours are 8:00 a.m. to 4:30 p.m. Monday - Friday. Please note that voicemails left after 4:00 p.m. may not be returned until the following business day.  We are closed weekends and major holidays. You have access to a nurse at all times for urgent questions. Please call the main number to the clinic [phone] and follow the prompts.  For any non-urgent questions, you may also contact your provider using MyChart. We now offer e-Visits for anyone 18 and older to request care online for non-urgent symptoms. For details visit [email or web address].   Also download the MyChart app! Go to the app store, search "MyChart", open the app, select [place], and log in with your MyChart username and password.    

## 2023-04-28 NOTE — Assessment & Plan Note (Signed)
 Treatment plan as listed above. 

## 2023-04-28 NOTE — Assessment & Plan Note (Signed)
Recommend patient to seek dermatology evaluation.

## 2023-04-28 NOTE — Assessment & Plan Note (Signed)
Majority of his lucencies were not hypermetabolic on previous PET scan Xgeva monthly-  currently hold due to dental issue.  Continue calcium and vitamin D supplementation.

## 2023-04-29 ENCOUNTER — Telehealth: Payer: Self-pay

## 2023-04-29 LAB — KAPPA/LAMBDA LIGHT CHAINS
Kappa free light chain: 33.1 mg/L — ABNORMAL HIGH (ref 3.3–19.4)
Kappa, lambda light chain ratio: 4.24 — ABNORMAL HIGH (ref 0.26–1.65)
Lambda free light chains: 7.8 mg/L (ref 5.7–26.3)

## 2023-04-29 NOTE — Telephone Encounter (Signed)
Patient is scheduled for Bone Marrow Bx on 05/08/23 at 9:30am (arrive 8:30am). Patient is aware of appointment date and time.

## 2023-05-01 LAB — MULTIPLE MYELOMA PANEL, SERUM
Albumin SerPl Elph-Mcnc: 4.3 g/dL (ref 2.9–4.4)
Albumin/Glob SerPl: 1.9 — ABNORMAL HIGH (ref 0.7–1.7)
Alpha 1: 0.2 g/dL (ref 0.0–0.4)
Alpha2 Glob SerPl Elph-Mcnc: 0.7 g/dL (ref 0.4–1.0)
B-Globulin SerPl Elph-Mcnc: 0.9 g/dL (ref 0.7–1.3)
Gamma Glob SerPl Elph-Mcnc: 0.5 g/dL (ref 0.4–1.8)
Globulin, Total: 2.3 g/dL (ref 2.2–3.9)
IgA: 73 mg/dL (ref 61–437)
IgG (Immunoglobin G), Serum: 458 mg/dL — ABNORMAL LOW (ref 603–1613)
IgM (Immunoglobulin M), Srm: 12 mg/dL — ABNORMAL LOW (ref 20–172)
Total Protein ELP: 6.6 g/dL (ref 6.0–8.5)

## 2023-05-02 ENCOUNTER — Inpatient Hospital Stay: Payer: Medicare Other

## 2023-05-02 NOTE — Progress Notes (Signed)
[facility] CSW Progress Note  Clinical Child psychotherapist returned patient's call requesting a Veterinary surgeon.  He was unable to speak extensively during the call today, but made an appointment for Monday, 7/22, at 9am.    Dorothey Baseman, LCSW Clinical Social Worker [facility]

## 2023-05-05 ENCOUNTER — Other Ambulatory Visit: Payer: Self-pay | Admitting: Radiology

## 2023-05-05 ENCOUNTER — Inpatient Hospital Stay: Payer: Medicare Other

## 2023-05-05 DIAGNOSIS — Z01812 Encounter for preprocedural laboratory examination: Secondary | ICD-10-CM

## 2023-05-05 NOTE — Progress Notes (Signed)
[facility] Clinical Social Work  Initial Assessment   Luis Burke is a 67 y.o. year old male. Clinical Social Work was referred by self for assessment of psychosocial needs.   SDOH (Social Determinants of Health) assessments performed: Yes SDOH Interventions    Flowsheet Row Clinical Support from 05/05/2023 in [facility]  SDOH Interventions   Food Insecurity Interventions Intervention Not Indicated  Housing Interventions Intervention Not Indicated  Transportation Interventions Intervention Not Indicated  Utilities Interventions Intervention Not Indicated  Financial Strain Interventions Intervention Not Indicated  Health Literacy Interventions Intervention Not Indicated       SDOH Screenings   Food Insecurity: No Food Insecurity (05/05/2023)  Housing: Low Risk  (05/05/2023)  Transportation Needs: No Transportation Needs (05/05/2023)  Utilities: Not At Risk (05/05/2023)  Depression (PHQ2-9): Low Risk  (07/25/2022)  Financial Resource Strain: Low Risk  (05/05/2023)  Tobacco Use: Low Risk  (04/28/2023)  Health Literacy: Adequate Health Literacy (05/05/2023)     Distress Screen completed: No    07/27/2018    9:19 AM  ONCBCN DISTRESS SCREENING  Screening Type Initial Screening  Distress experienced in past week (1-10) 0      Family/Social Information:  Housing Arrangement: patient lives with his wife, Baxter Hire.   He has partial custody of his 64 y/o granddaughter.  He splits it with his ex-wife.  Their daughter died four days after giving birth to his granddaughter.   Family members/support persons in your life? Family, Friends, and Programme researcher, broadcasting/film/video.  Patient has four sisters that he is close to. Transportation concerns: no  Employment: Retired Patent examiner. Income source: Actor concerns: No Type of concern: None Food access concerns: no Religious or spiritual practice: Not known Services Currently in place:   Medicare  Coping/ Adjustment to diagnosis: Patient understands treatment plan and what happens next? yes Concerns about diagnosis and/or treatment: How will I care for myself.  Informed him there are agencies he can hire to care for him in the future if needed. 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 Patient enjoys time with family/ friends Current coping skills/ strengths: Average or above average intelligence , Capable of independent living , Communication skills , Financial means , General fund of knowledge , Motivation for treatment/growth , and Supportive family/friends     SUMMARY: Current SDOH Barriers:  Family and relationship dysfunction  Clinical Social Work Clinical Goal(s):  Provide a safe environment for patient to discuss his relationship with his wife.  Interventions: Discussed common feeling and emotions when being diagnosed with cancer, and the importance of support during treatment Informed patient of the support team roles and support services at [facility] Provided CSW contact information and encouraged patient to call with any questions or concerns Provided patient with information about CSW role.  Provided active listening and supportive counseling regarding his relationship with his wife.  Validated his feelings.   Follow Up Plan: Patient will contact CSW with any support or resource needs Patient verbalizes understanding of plan: Yes    Luis Baseman, LCSW Clinical Social Worker [facility]

## 2023-05-07 ENCOUNTER — Encounter: Payer: Self-pay | Admitting: Radiology

## 2023-05-07 NOTE — Progress Notes (Signed)
Patient for IR Bone Marrow Biopsy on Thurs 05/08/2023, I called and spoke with the patient on the phone and gave pre-procedure instructions. Pt was made aware to be here at 8:30a, NPO after MN prior to procedure as well as driver post procedure/recovery/discharge. Pt stated understanding.  Called 05/07/2023

## 2023-05-07 NOTE — H&P (Signed)
Chief Complaint: Patient was seen in consultation today for multiple myeloma at the request of Yu,Zhou  Referring Physician(s): Yu,Zhou  Supervising Physician: Gilmer Mor  Patient Status: [facility] - Out-pt  History of Present Illness: Luis Burke is a 67 y.o. male with PMHx significant for thrombocytopenia, CKD III, HTN, hyperlipidemia, multiple myeloma s/p autologous bone marrow stem cell transplant at [facility] 04/23/2019 with early relapse 05/2021 and most recent bone marrow biopsy 11/20/2021 with minimal residual plasma cell neoplasm disease, patient has been on monthly chemotherapy maintenance. The patient follows with oncology and is scheduled for bone marrow biopsy today.   Past Medical History:  Diagnosis Date   AKI (acute kidney injury) (HCC) 01/06/2019   Anemia    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HCC)    autologous stem cell bone marrow transplant on 04/23/2019.   Fatty liver    on u/s 07/2018   Foot fracture    Hyperlipidemia 03/2004   Hypertension 1994   Multiple myeloma (HCC)    Snores     Past Surgical History:  Procedure Laterality Date   CATARACT EXTRACTION Left 10/10/2021   CATARACT EXTRACTION W/PHACO Right 03/04/2018   Procedure: CATARACT EXTRACTION PHACO AND INTRAOCULAR LENS PLACEMENT (IOC)  RIGHT TORIC;  Surgeon: Lockie Mola, MD;  Location: [facility];  Service: Ophthalmology;  Laterality: Right;  Per [place] no Toric Lens 1:45 5.3   CATARACT EXTRACTION W/PHACO Left 10/10/2021   Procedure: CATARACT EXTRACTION PHACO AND INTRAOCULAR LENS PLACEMENT (IOC) LEFT;  Surgeon: Lockie Mola, MD;  Location: [facility];  Service: Ophthalmology;  Laterality: Left;  5.16 1:03.8   COLONOSCOPY WITH PROPOFOL N/A 07/20/2018   Procedure: COLONOSCOPY WITH PROPOFOL;  Surgeon: Wyline Mood, MD;  Location: [facility] END[facility]COPY;  Service: Gastroenterology;  Laterality: N/A;   ESOPHAGOGASTRODUODENOSCOPY (EGD) WITH PROPOFOL N/A 07/20/2018   Procedure:  ESOPHAGOGASTRODUODENOSCOPY (EGD) WITH PROPOFOL;  Surgeon: Wyline Mood, MD;  Location: [facility] END[facility];  Service: Gastroenterology;  Laterality: N/A;   EYE SURGERY Right    HERNIA REPAIR     L Lap herniorraphy  04/2000   Left wrist ganglionectomy      Allergies: Bactrim [sulfamethoxazole-trimethoprim], Clonidine derivatives, and Tadalafil  Medications: Prior to Admission medications   Medication Sig Start Date End Date Taking? Authorizing Provider  acetaminophen (TYLENOL) 325 MG tablet Take 2 tablets (650 mg total) by mouth See admin instructions. 1 hour prior to chemotherapy treatments 06/07/21   [name], MD  acyclovir (ZOVIRAX) 400 MG tablet TAKE 1 TABLET(400 MG) BY MOUTH TWICE DAILY 02/17/23   [name], MD  amLODipine (NORVASC) 5 MG tablet Take 2 tablets (10 mg total) by mouth daily. 07/25/22   Joaquim Nam, MD  ASPIRIN 81 PO Take 81 mg by mouth daily.    [provider]  B Complex Vitamins (VITAMIN B COMPLEX PO) Take 1 Dose by mouth daily. 11/17/19   [provider]  clindamycin (CLEOCIN) 300 MG capsule Take 300 mg by mouth every 6 (six) hours. 03/11/23   [provider]  co-enzyme Q-10 50 MG capsule Take 50 mg by mouth daily.    [provider]  [facility] VITAMIN B12 1000 MCG tablet TAKE 1 TABLET BY MOUTH EVERY DAY 01/09/21   Joaquim Nam, MD  Denosumab (XGEVA [place]) Inject into the skin every 30 (thirty) days. Last rcvd 09/27/21 Patient not taking: Reported on 04/28/2023    [provider]  dexamethasone (DECADRON) 4 MG tablet Take 5 tablets (20 mg total) by mouth as directed. Take one hour before  monthly Darzalex injection. 10/30/22   [name], MD  diphenhydrAMINE (BENADRYL) 50 MG tablet Take 1 tablet (50 mg total) by mouth See admin instructions. Take 1 tablet 1 hour prior to chemotherapy treatments. 06/07/21   [name], MD  hydrALAZINE (APRESOLINE) 10 MG tablet TAKE 1 TABLET(10 MG) BY MOUTH THREE TIMES DAILY 08/19/22   Joaquim Nam, MD   lisinopril (ZESTRIL) 20 MG tablet Take 1 tablet (20 mg total) by mouth daily. 07/25/22   Joaquim Nam, MD  magnesium chloride (SLOW-MAG) 64 MG TBEC SR tablet Take 1 tablet (64 mg total) by mouth daily. 06/22/19   [name], MD  metoprolol succinate (TOPROL-XL) 50 MG 24 hr tablet TAKE 4 TABLETS BY MOUTH EVERY DAY WITH OR IMMEDIATELY FOLLOWING A MEAL 07/25/22   Joaquim Nam, MD  metronidazole (FLAGYL) 375 MG capsule Take 750 mg by mouth 3 (three) times daily. 03/12/23   [provider]  montelukast (SINGULAIR) 10 MG tablet Take 1 tablet (10 mg total) by mouth See admin instructions. Take 1day prior to chemotherapy 07/08/21   [name], MD  pravastatin (PRAVACHOL) 10 MG tablet Take 1 tablet (10 mg total) by mouth daily. 07/25/22   Joaquim Nam, MD  prochlorperazine (COMPAZINE) 10 MG tablet  04/14/19   [provider]  sildenafil (REVATIO) 20 MG tablet TAKE 1-5 TABLETS (20-100 MG TOTAL) BY MOUTH DAILY AS NEEDED 09/02/22   Joaquim Nam, MD  zinc gluconate 50 MG tablet Take 50 mg by mouth daily.    [provider]     Family History  Problem Relation Age of Onset   Hypertension Mother    Diabetes Mother    Heart disease Mother        CAD   Dementia Mother    Prostate cancer Father    Hypertension Father    Heart disease Father        MI 02/03   Colon cancer Father    Hypertension Sister    Hypertension Sister    Hypertension Sister     Social History   Socioeconomic History   Marital status: Married    Spouse name: Not on file   Number of children: 1   Years of education: Not on file   Highest education level: Not on file  Occupational History   Occupation: [employer]  Tobacco Use   Smoking status: Never   Smokeless tobacco: Never   Tobacco comments:    Occassionally  Vaping Use   Vaping status: Never Used  Substance and Sexual Activity   Alcohol use: Not Currently    Alcohol/week: 3.0 standard drinks of alcohol    Types: 3 Cans of beer  per week   Drug use: No   Sexual activity: Not on file  Other Topics Concern   Not on file  Social History Narrative   Divorced 2009, married 05-31-2021.     His daughter died 4 days after giving birth to the patient's granddaughter   Has joint custody of his deceased daughter's child   Worked at [employer] is [place], retired 2020.     Social Determinants of Health   Financial Resource Strain: Low Risk  (05/05/2023)   Overall Financial Resource Strain (CARDIA)    Difficulty of Paying Living Expenses: Not hard at all  Food Insecurity: No Food Insecurity (05/05/2023)   Hunger Vital Sign    Worried About Running Out of Food in the Last Year: Never true    Ran Out of  Food in the Last Year: Never true  Transportation Needs: No Transportation Needs (05/05/2023)   PRAPARE - Administrator, Civil Service (Medical): No    Lack of Transportation (Non-Medical): No  Physical Activity: Not on file  Stress: Not on file  Social Connections: Not on file    Review of Systems: A 12 point ROS discussed and pertinent positives are indicated in the HPI above.  All other systems are negative.  Review of Systems  Vital Signs: There were no vitals taken for this visit.  Advance Care Plan: {Advance Care ZOXW:96045}    Physical Exam  Imaging: No results found.  Labs:  CBC: Recent Labs    02/03/23 1242 03/03/23 0845 03/31/23 0815 04/28/23 0813  WBC 6.3 5.6 5.4 5.8  HGB 14.9 13.6 14.7 15.1  HCT 42.9 39.8 42.3 43.2  PLT 100* 85* 88* 87*    COAGS: No results for input(s): "INR", "APTT" in the last 8760 hours.  BMP: Recent Labs    02/03/23 1242 03/03/23 0845 03/31/23 0815 04/28/23 0813  NA 136 138 136 135  K 3.6 3.9 3.9 3.8  CL 103 106 103 103  CO2 21* 24 24 22   GLUCOSE 113* 108* 127* 118*  BUN 21 16 20 20   CALCIUM 9.5 9.1 9.6 9.5  CREATININE 1.27* 1.26* 1.38* 1.43*  GFRNONAA >60 >60 56* 54*    LIVER FUNCTION TESTS: Recent Labs    02/03/23 1242  03/03/23 0845 03/31/23 0815 04/28/23 0813  BILITOT 0.5 0.6 0.8 0.4  AST 21 25 21 18   ALT 18 24 24 18   ALKPHOS 66 60 62 68  PROT 7.3 6.6 6.9 7.2  ALBUMIN 4.8 4.3 4.5 4.6    Assessment and Plan: This is a 67 year old male with PMHx significant for thrombocytopenia, CKD III, HTN, hyperlipidemia, multiple myeloma s/p autologous bone marrow stem cell transplant at [facility] 04/23/2019 with early relapse 05/2021 and most recent bone marrow biopsy 11/20/2021 with minimal residual plasma cell neoplasm disease, patient has been on monthly chemotherapy maintenance. The patient follows with oncology and is scheduled for bone marrow biopsy today.   The patient has been NPO, labs and vitals have been reviewed.  Risks and benefits of image guided bone marrow biopsy with moderate sedation was discussed with the patient and/or patient's family including, but not limited to bleeding, infection, damage to adjacent structures or low yield requiring additional tests.  All of the questions were answered and there is agreement to proceed.  Consent signed and in chart.    Thank you for this interesting consult.  I greatly enjoyed meeting Luis Burke and look forward to participating in their care.  A copy of this report was sent to the requesting provider on this date.  Electronically Signed: Berneta Levins, PA-C 05/07/2023, 9:07 AM   I spent a total of 15 Minutes in face to face in clinical consultation, greater than 50% of which was counseling/coordinating care for multiple myeloma.

## 2023-05-08 ENCOUNTER — Ambulatory Visit
Admission: RE | Admit: 2023-05-08 | Discharge: 2023-05-08 | Disposition: A | Discharge status: Home / Self Care | Payer: Medicare Other | Source: Ambulatory Visit | Attending: Oncology | Admitting: Oncology

## 2023-05-08 ENCOUNTER — Other Ambulatory Visit: Payer: Self-pay

## 2023-05-08 ENCOUNTER — Encounter: Payer: Self-pay | Admitting: Radiology

## 2023-05-08 DIAGNOSIS — E785 Hyperlipidemia, unspecified: Secondary | ICD-10-CM | POA: Insufficient documentation

## 2023-05-08 DIAGNOSIS — D696 Thrombocytopenia, unspecified: Secondary | ICD-10-CM | POA: Insufficient documentation

## 2023-05-08 DIAGNOSIS — Z1379 Encounter for other screening for genetic and chromosomal anomalies: Secondary | ICD-10-CM | POA: Insufficient documentation

## 2023-05-08 DIAGNOSIS — N183 Chronic kidney disease, stage 3 unspecified: Secondary | ICD-10-CM | POA: Diagnosis not present

## 2023-05-08 DIAGNOSIS — I129 Hypertensive chronic kidney disease with stage 1 through stage 4 chronic kidney disease, or unspecified chronic kidney disease: Secondary | ICD-10-CM | POA: Diagnosis not present

## 2023-05-08 DIAGNOSIS — C9000 Multiple myeloma not having achieved remission: Secondary | ICD-10-CM | POA: Diagnosis not present

## 2023-05-08 DIAGNOSIS — Z01812 Encounter for preprocedural laboratory examination: Secondary | ICD-10-CM

## 2023-05-08 HISTORY — PX: IR BONE MARROW BIOPSY & ASPIRATION: IMG5727

## 2023-05-08 LAB — CBC WITH DIFFERENTIAL/PLATELET
Abs Immature Granulocytes: 0.01 10*3/uL (ref 0.00–0.07)
Basophils Absolute: 0 10*3/uL (ref 0.0–0.1)
Basophils Relative: 0 %
Eosinophils Absolute: 0 10*3/uL (ref 0.0–0.5)
Eosinophils Relative: 1 %
HCT: 40.6 % (ref 39.0–52.0)
Hemoglobin: 14.1 g/dL (ref 13.0–17.0)
Immature Granulocytes: 0 %
Lymphocytes Relative: 30 %
Lymphs Abs: 1.4 10*3/uL (ref 0.7–4.0)
MCH: 34.4 pg — ABNORMAL HIGH (ref 26.0–34.0)
MCHC: 34.7 g/dL (ref 30.0–36.0)
MCV: 99 fL (ref 80.0–100.0)
Monocytes Absolute: 0.4 10*3/uL (ref 0.1–1.0)
Monocytes Relative: 9 %
Neutro Abs: 2.8 10*3/uL (ref 1.7–7.7)
Neutrophils Relative %: 60 %
Platelets: 90 10*3/uL — ABNORMAL LOW (ref 150–400)
RBC: 4.1 MIL/uL — ABNORMAL LOW (ref 4.22–5.81)
RDW: 13 % (ref 11.5–15.5)
WBC: 4.7 10*3/uL (ref 4.0–10.5)
nRBC: 0 % (ref 0.0–0.2)

## 2023-05-08 MED ORDER — HEPARIN SOD (PORK) LOCK FLUSH 100 UNIT/ML IV SOLN
INTRAVENOUS | Status: AC
  Filled 2023-05-08: qty 5

## 2023-05-08 MED ORDER — MIDAZOLAM HCL 2 MG/2ML IJ SOLN
INTRAMUSCULAR | Status: AC
  Filled 2023-05-08: qty 2

## 2023-05-08 MED ORDER — FENTANYL CITRATE (PF) 100 MCG/2ML IJ SOLN
INTRAMUSCULAR | Status: AC | PRN
  Administered 2023-05-08 (×2): 50 ug via INTRAVENOUS

## 2023-05-08 MED ORDER — MIDAZOLAM HCL 2 MG/2ML IJ SOLN
INTRAMUSCULAR | Status: AC | PRN
  Administered 2023-05-08 (×2): 1 mg via INTRAVENOUS

## 2023-05-08 MED ORDER — LIDOCAINE 1 % OPTIME INJ - NO CHARGE
10.0000 mL | Freq: Once | INTRAMUSCULAR | Status: AC
  Administered 2023-05-08: 8 mL via INTRADERMAL
  Filled 2023-05-08: qty 10

## 2023-05-08 MED ORDER — SODIUM CHLORIDE 0.9 % IV SOLN: INTRAVENOUS | Status: DC

## 2023-05-08 MED ORDER — FENTANYL CITRATE (PF) 100 MCG/2ML IJ SOLN
INTRAMUSCULAR | Status: AC
  Filled 2023-05-08: qty 2

## 2023-05-08 NOTE — Procedures (Signed)
Interventional Radiology Procedure Note  Procedure: fluoro guided aspirate and core biopsy of right posterior iliac bone Complications: None Recommendations: - Bedrest supine x 1 hrs - OTC's PRN  Pain - Follow biopsy results  Signed,  Yvone Neu. Loreta Ave, DO, ABVM, RPVI

## 2023-05-14 ENCOUNTER — Encounter (INDEPENDENT_AMBULATORY_CARE_PROVIDER_SITE_OTHER): Payer: Self-pay

## 2023-05-16 ENCOUNTER — Encounter (HOSPITAL_COMMUNITY): Payer: Self-pay | Admitting: Oncology

## 2023-05-19 ENCOUNTER — Telehealth: Payer: Self-pay

## 2023-05-19 NOTE — Telephone Encounter (Signed)
Pt informed of MD response.  

## 2023-05-19 NOTE — Telephone Encounter (Signed)
Patient states he had a Bone Marrow Biopsy done on 7/25. He states he can see the report but does not understand it. He is requesting for someone to call him back to go over that report with him. He know he has an appointment on the 8/12 but would really like to hear back from the office with results before appointment, just in case there is some findings and he has discuss things over with his wife.

## 2023-05-20 ENCOUNTER — Other Ambulatory Visit: Payer: Self-pay | Admitting: Oncology

## 2023-05-21 ENCOUNTER — Encounter: Payer: Self-pay | Admitting: Oncology

## 2023-05-22 ENCOUNTER — Encounter (HOSPITAL_COMMUNITY): Payer: Self-pay | Admitting: Oncology

## 2023-05-26 ENCOUNTER — Inpatient Hospital Stay: Payer: Medicare Other | Attending: Oncology

## 2023-05-26 ENCOUNTER — Inpatient Hospital Stay: Payer: Medicare Other

## 2023-05-26 ENCOUNTER — Encounter: Payer: Self-pay | Admitting: Oncology

## 2023-05-26 ENCOUNTER — Other Ambulatory Visit: Payer: Self-pay

## 2023-05-26 ENCOUNTER — Telehealth: Payer: Self-pay | Admitting: Oncology

## 2023-05-26 ENCOUNTER — Inpatient Hospital Stay: Payer: Medicare Other | Admitting: Oncology

## 2023-05-26 VITALS — BP 138/88 | HR 69 | Temp 96.9°F | Resp 18 | Wt 205.0 lb

## 2023-05-26 DIAGNOSIS — Z8042 Family history of malignant neoplasm of prostate: Secondary | ICD-10-CM | POA: Diagnosis not present

## 2023-05-26 DIAGNOSIS — C9000 Multiple myeloma not having achieved remission: Secondary | ICD-10-CM

## 2023-05-26 DIAGNOSIS — Z800 Family history of malignant neoplasm of digestive organs: Secondary | ICD-10-CM | POA: Diagnosis not present

## 2023-05-26 DIAGNOSIS — N1831 Chronic kidney disease, stage 3a: Secondary | ICD-10-CM | POA: Diagnosis not present

## 2023-05-26 DIAGNOSIS — Z5112 Encounter for antineoplastic immunotherapy: Secondary | ICD-10-CM | POA: Diagnosis present

## 2023-05-26 DIAGNOSIS — C7951 Secondary malignant neoplasm of bone: Secondary | ICD-10-CM

## 2023-05-26 DIAGNOSIS — Z5111 Encounter for antineoplastic chemotherapy: Secondary | ICD-10-CM

## 2023-05-26 DIAGNOSIS — D696 Thrombocytopenia, unspecified: Secondary | ICD-10-CM | POA: Insufficient documentation

## 2023-05-26 LAB — COMPREHENSIVE METABOLIC PANEL
ALT: 18 U/L (ref 0–44)
AST: 19 U/L (ref 15–41)
Albumin: 4.6 g/dL (ref 3.5–5.0)
Alkaline Phosphatase: 61 U/L (ref 38–126)
Anion gap: 10 (ref 5–15)
BUN: 20 mg/dL (ref 8–23)
CO2: 24 mmol/L (ref 22–32)
Calcium: 9.6 mg/dL (ref 8.9–10.3)
Chloride: 104 mmol/L (ref 98–111)
Creatinine, Ser: 1.34 mg/dL — ABNORMAL HIGH (ref 0.61–1.24)
GFR, Estimated: 58 mL/min — ABNORMAL LOW (ref >60)
Glucose, Bld: 115 mg/dL — ABNORMAL HIGH (ref 70–99)
Potassium: 3.9 mmol/L (ref 3.5–5.1)
Sodium: 138 mmol/L (ref 135–145)
Total Bilirubin: 0.6 mg/dL (ref 0.3–1.2)
Total Protein: 7.2 g/dL (ref 6.5–8.1)

## 2023-05-26 LAB — CBC WITH DIFFERENTIAL/PLATELET
Abs Immature Granulocytes: 0.01 10*3/uL (ref 0.00–0.07)
Basophils Absolute: 0 10*3/uL (ref 0.0–0.1)
Basophils Relative: 0 %
Eosinophils Absolute: 0.1 10*3/uL (ref 0.0–0.5)
Eosinophils Relative: 1 %
HCT: 42.9 % (ref 39.0–52.0)
Hemoglobin: 14.8 g/dL (ref 13.0–17.0)
Immature Granulocytes: 0 %
Lymphocytes Relative: 35 %
Lymphs Abs: 1.9 10*3/uL (ref 0.7–4.0)
MCH: 34.2 pg — ABNORMAL HIGH (ref 26.0–34.0)
MCHC: 34.5 g/dL (ref 30.0–36.0)
MCV: 99.1 fL (ref 80.0–100.0)
Monocytes Absolute: 0.5 10*3/uL (ref 0.1–1.0)
Monocytes Relative: 8 %
Neutro Abs: 3 10*3/uL (ref 1.7–7.7)
Neutrophils Relative %: 56 %
Platelets: 83 10*3/uL — ABNORMAL LOW (ref 150–400)
RBC: 4.33 MIL/uL (ref 4.22–5.81)
RDW: 12.5 % (ref 11.5–15.5)
WBC: 5.5 10*3/uL (ref 4.0–10.5)
nRBC: 0 % (ref 0.0–0.2)

## 2023-05-26 LAB — IMMATURE PLATELET FRACTION: Immature Platelet Fraction: 1.8 % (ref 1.2–8.6)

## 2023-05-26 MED ORDER — DARATUMUMAB-HYALURONIDASE-FIHJ 1800-30000 MG-UT/15ML ~~LOC~~ SOLN
1800.0000 mg | Freq: Once | SUBCUTANEOUS | Status: AC
  Administered 2023-05-26: 1800 mg via SUBCUTANEOUS
  Filled 2023-05-26: qty 15

## 2023-05-26 NOTE — Assessment & Plan Note (Signed)
Majority of his lucencies were not hypermetabolic on previous PET scan Xgeva monthly-  currently hold due to dental issue.  Continue calcium and vitamin D supplementation.

## 2023-05-26 NOTE — Telephone Encounter (Signed)
Pt left vm about calling [facility] with his other oncologist and the earliest they could get him in is Sept 11th.   Pt wants to know if Dr.Yu can get pt an earlier appt at [facility]   Pt comes back here to [facility] Sept 9th   Pt would like a call back from a nurse to discuss

## 2023-05-26 NOTE — Assessment & Plan Note (Addendum)
Kappa Light chain multiple myeloma beta 2 microglobulin 5 and normal albumin.  Stage II.cytogenetics showed normal male chromosome, MDS FISH panel showed trisomy 67- Standard Risk.S/p RVD x 9 and  Autologous bone marrow stem cell transplant at [facility] on 04/23/2019.-->Early relapse multiple myeloma.  06/04/2021 5% plasma cells -August 2022-February 2023, patient completed 7 cycles of daratumumab/Pomalyst/dexamethasone. 11/20/2021, status post bone marrow biopsy which showed 1% of plasma cells.  Likely he has minimal residual disease.  Currently on daratumumab maintenance. He takes his supply of dexamethasone 20mg  prior to his treatment.  Labs are reviewed and discussed with patient.  Lab Results  Component Value Date   MPROTEIN Not Observed 04/28/2023   KPAFRELGTCHN 33.1 (H) 04/28/2023   LAMBDASER 7.8 04/28/2023   KAPLAMBRATIO 4.24 (H) 04/28/2023    Today's level is pending.    Proceed with daratumumab today .  Repeat bone marrow biopsy showed 1 % plasma cells, kappa light chain, cytogenetics showed Y chromosome loss, negative myeloma FISH continue current regimen.

## 2023-05-26 NOTE — Patient Instructions (Signed)
[facility] [place] [facility]  Discharge Instructions: Thank you for choosing [facility] to provide your oncology and hematology care.  If you have a lab appointment with the Cancer Center, please go directly to the Cancer Center and check in at the registration area.  Wear comfortable clothing and clothing appropriate for easy access to any Portacath or PICC line.   We strive to give you quality time with your provider. You may need to reschedule your appointment if you arrive late (15 or more minutes).  Arriving late affects you and other patients whose appointments are after yours.  Also, if you miss three or more appointments without notifying the office, you may be dismissed from the clinic at the provider's discretion.      For prescription refill requests, have your pharmacy contact our office and allow 72 hours for refills to be completed.    Today you received the following chemotherapy and/or immunotherapy agents darzalex    To help prevent nausea and vomiting after your treatment, we encourage you to take your nausea medication as directed.  BELOW ARE SYMPTOMS THAT SHOULD BE REPORTED IMMEDIATELY: *FEVER GREATER THAN 100.4 F (38 C) OR HIGHER *CHILLS OR SWEATING *NAUSEA AND VOMITING THAT IS NOT CONTROLLED WITH YOUR NAUSEA MEDICATION *UNUSUAL SHORTNESS OF BREATH *UNUSUAL BRUISING OR BLEEDING *URINARY PROBLEMS (pain or burning when urinating, or frequent urination) *BOWEL PROBLEMS (unusual diarrhea, constipation, pain near the anus) TENDERNESS IN MOUTH AND THROAT WITH OR WITHOUT PRESENCE OF ULCERS (sore throat, sores in mouth, or a toothache) UNUSUAL RASH, SWELLING OR PAIN  UNUSUAL VAGINAL DISCHARGE OR ITCHING   Items with * indicate a potential emergency and should be followed up as soon as possible or go to the Emergency Department if any problems should occur.  Please show the CHEMOTHERAPY ALERT CARD or IMMUNOTHERAPY ALERT CARD at check-in to  the Emergency Department and triage nurse.  Should you have questions after your visit or need to cancel or reschedule your appointment, please contact [facility] [place] [facility]  [phone] and follow the prompts.  Office hours are 8:00 a.m. to 4:30 p.m. Monday - Friday. Please note that voicemails left after 4:00 p.m. may not be returned until the following business day.  We are closed weekends and major holidays. You have access to a nurse at all times for urgent questions. Please call the main number to the clinic [phone] and follow the prompts.  For any non-urgent questions, you may also contact your provider using MyChart. We now offer e-Visits for anyone 18 and older to request care online for non-urgent symptoms. For details visit [email or web address].   Also download the MyChart app! Go to the app store, search "MyChart", open the app, select [facility], and log in with your MyChart username and password.    

## 2023-05-26 NOTE — Assessment & Plan Note (Addendum)
Decreased, will monitor counts.  Immature platelet fraction is low, likely due to marrow suppression.

## 2023-05-26 NOTE — Assessment & Plan Note (Signed)
Treatment plan as listed above. 

## 2023-05-26 NOTE — Assessment & Plan Note (Signed)
Avoid nephrotoxin.  Encourage hydration. 
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
## 2023-05-26 NOTE — Progress Notes (Signed)
Per Cathie Hoops, MD no xgeva today. Pt declined observation post darzalex injection. Pt stable at discharge.

## 2023-05-27 NOTE — Telephone Encounter (Signed)
Spoke to pt and he would really like to see Dr. Alvino Chapel before he can see Dr. Cathie Hoops. Office visit notes have been faxed to Dr. Teresa Coombs office to see if he can get a sooner appointment.

## 2023-06-12 ENCOUNTER — Encounter: Payer: Self-pay | Admitting: Family Medicine

## 2023-06-12 ENCOUNTER — Ambulatory Visit: Payer: Medicare Other | Admitting: Family Medicine

## 2023-06-12 VITALS — BP 130/80 | HR 92 | Temp 98.2°F | Ht 70.0 in | Wt 204.0 lb

## 2023-06-12 DIAGNOSIS — E785 Hyperlipidemia, unspecified: Secondary | ICD-10-CM

## 2023-06-12 DIAGNOSIS — I10 Essential (primary) hypertension: Secondary | ICD-10-CM

## 2023-06-12 DIAGNOSIS — Z9481 Bone marrow transplant status: Secondary | ICD-10-CM | POA: Diagnosis not present

## 2023-06-12 LAB — LIPID PANEL
Cholesterol: 188 mg/dL (ref 0–200)
HDL: 36.4 mg/dL — ABNORMAL LOW (ref >39.00)
NonHDL: 151.47
Total CHOL/HDL Ratio: 5
Triglycerides: 318 mg/dL — ABNORMAL HIGH (ref 0.0–149.0)
VLDL: 63.6 mg/dL — ABNORMAL HIGH (ref 0.0–40.0)

## 2023-06-12 LAB — LDL CHOLESTEROL, DIRECT: Direct LDL: 144 mg/dL

## 2023-06-12 LAB — VITAMIN B12: Vitamin B-12: 905 pg/mL (ref 211–911)

## 2023-06-12 MED ORDER — METOPROLOL SUCCINATE ER 50 MG PO TB24: ORAL_TABLET | ORAL | Status: DC

## 2023-06-12 MED ORDER — DIPHENHYDRAMINE HCL 50 MG PO TABS: 50.0000 mg | ORAL_TABLET | ORAL | Status: DC

## 2023-06-12 NOTE — Progress Notes (Signed)
Hypertension:    Using medication without problems or lightheadedness: very rarely lightheaded.  Cautions d/w pt.   Chest pain with exertion:no Edema:no Short of breath:no He had higher BP readings outside of this clinic.  Normal BP today. Compliant with meds.  Stressors d/w pt.  He has [facility]ology f/u pending.  Recent labs d/w pt.    He is due for follow-up B12 level and lipid panel.  See notes on labs.  Meds, vitals, and allergies reviewed.  PMH and SH reviewed  ROS: Per HPI unless specifically indicated in ROS section   GEN: nad, alert and oriented HEENT: ncat NECK: supple w/o LA CV: rrr. PULM: ctab, no inc wob ABD: soft, +bs EXT: no edema SKIN: no acute rash

## 2023-06-12 NOTE — Patient Instructions (Signed)
Go to the lab on the way out.   If you have mychart we'll likely use that to update you.    Take care.  Glad to see you. 

## 2023-06-16 NOTE — Assessment & Plan Note (Signed)
He had higher BP readings outside of this clinic.  Normal BP today. Compliant with meds.  Stressors d/w pt.  He has [facility] oncology f/u pending.  Recent labs d/w pt.    See notes on follow-up labs. I do not think it makes sense to change his medications at this point.  Continue amlodipine hydralazine and lisinopril metoprolol and update me if he has persistent blood pressure elevations.

## 2023-06-23 ENCOUNTER — Inpatient Hospital Stay: Payer: Medicare Other

## 2023-06-23 ENCOUNTER — Inpatient Hospital Stay: Payer: Medicare Other | Attending: Oncology

## 2023-06-23 ENCOUNTER — Encounter: Payer: Self-pay | Admitting: Oncology

## 2023-06-23 ENCOUNTER — Inpatient Hospital Stay (HOSPITAL_BASED_OUTPATIENT_CLINIC_OR_DEPARTMENT_OTHER): Payer: Medicare Other | Admitting: Oncology

## 2023-06-23 VITALS — BP 140/91 | HR 67 | Temp 97.7°F | Resp 18 | Wt 210.9 lb

## 2023-06-23 VITALS — BP 173/99 | HR 81 | Temp 97.6°F | Resp 18

## 2023-06-23 DIAGNOSIS — Z5112 Encounter for antineoplastic immunotherapy: Secondary | ICD-10-CM | POA: Diagnosis present

## 2023-06-23 DIAGNOSIS — Z9484 Stem cells transplant status: Secondary | ICD-10-CM | POA: Insufficient documentation

## 2023-06-23 DIAGNOSIS — C7951 Secondary malignant neoplasm of bone: Secondary | ICD-10-CM | POA: Insufficient documentation

## 2023-06-23 DIAGNOSIS — C9000 Multiple myeloma not having achieved remission: Secondary | ICD-10-CM

## 2023-06-23 DIAGNOSIS — N1832 Chronic kidney disease, stage 3b: Secondary | ICD-10-CM | POA: Insufficient documentation

## 2023-06-23 DIAGNOSIS — N1831 Chronic kidney disease, stage 3a: Secondary | ICD-10-CM | POA: Diagnosis not present

## 2023-06-23 DIAGNOSIS — D696 Thrombocytopenia, unspecified: Secondary | ICD-10-CM

## 2023-06-23 DIAGNOSIS — Z5111 Encounter for antineoplastic chemotherapy: Secondary | ICD-10-CM

## 2023-06-23 DIAGNOSIS — Z9481 Bone marrow transplant status: Secondary | ICD-10-CM | POA: Diagnosis not present

## 2023-06-23 LAB — COMPREHENSIVE METABOLIC PANEL
ALT: 21 U/L (ref 0–44)
AST: 19 U/L (ref 15–41)
Albumin: 4.7 g/dL (ref 3.5–5.0)
Alkaline Phosphatase: 64 U/L (ref 38–126)
Anion gap: 6 (ref 5–15)
BUN: 19 mg/dL (ref 8–23)
CO2: 25 mmol/L (ref 22–32)
Calcium: 9.4 mg/dL (ref 8.9–10.3)
Chloride: 106 mmol/L (ref 98–111)
Creatinine, Ser: 1.36 mg/dL — ABNORMAL HIGH (ref 0.61–1.24)
GFR, Estimated: 57 mL/min — ABNORMAL LOW (ref >60)
Glucose, Bld: 108 mg/dL — ABNORMAL HIGH (ref 70–99)
Potassium: 3.9 mmol/L (ref 3.5–5.1)
Sodium: 137 mmol/L (ref 135–145)
Total Bilirubin: 0.9 mg/dL (ref 0.3–1.2)
Total Protein: 7.2 g/dL (ref 6.5–8.1)

## 2023-06-23 LAB — CBC WITH DIFFERENTIAL/PLATELET
Abs Immature Granulocytes: 0.02 10*3/uL (ref 0.00–0.07)
Basophils Absolute: 0 10*3/uL (ref 0.0–0.1)
Basophils Relative: 0 %
Eosinophils Absolute: 0.1 10*3/uL (ref 0.0–0.5)
Eosinophils Relative: 1 %
HCT: 42.8 % (ref 39.0–52.0)
Hemoglobin: 14.7 g/dL (ref 13.0–17.0)
Immature Granulocytes: 0 %
Lymphocytes Relative: 41 %
Lymphs Abs: 2.4 10*3/uL (ref 0.7–4.0)
MCH: 34.2 pg — ABNORMAL HIGH (ref 26.0–34.0)
MCHC: 34.3 g/dL (ref 30.0–36.0)
MCV: 99.5 fL (ref 80.0–100.0)
Monocytes Absolute: 0.5 10*3/uL (ref 0.1–1.0)
Monocytes Relative: 8 %
Neutro Abs: 2.9 10*3/uL (ref 1.7–7.7)
Neutrophils Relative %: 50 %
Platelets: 89 10*3/uL — ABNORMAL LOW (ref 150–400)
RBC: 4.3 MIL/uL (ref 4.22–5.81)
RDW: 13.1 % (ref 11.5–15.5)
WBC: 5.8 10*3/uL (ref 4.0–10.5)
nRBC: 0 % (ref 0.0–0.2)

## 2023-06-23 MED ORDER — DARATUMUMAB-HYALURONIDASE-FIHJ 1800-30000 MG-UT/15ML ~~LOC~~ SOLN
1800.0000 mg | Freq: Once | SUBCUTANEOUS | Status: AC
  Administered 2023-06-23: 1800 mg via SUBCUTANEOUS
  Filled 2023-06-23: qty 15

## 2023-06-23 NOTE — Assessment & Plan Note (Signed)
Decreased, will monitor counts.  Immature platelet fraction is low, likely due to marrow suppression.

## 2023-06-23 NOTE — Patient Instructions (Signed)
[facility] [place] [facility]  Discharge Instructions: Thank you for choosing [facility] to provide your oncology and hematology care.  If you have a lab appointment with the Cancer Center, please go directly to the Cancer Center and check in at the registration area.  Wear comfortable clothing and clothing appropriate for easy access to any Portacath or PICC line.   We strive to give you quality time with your provider. You may need to reschedule your appointment if you arrive late (15 or more minutes).  Arriving late affects you and other patients whose appointments are after yours.  Also, if you miss three or more appointments without notifying the office, you may be dismissed from the clinic at the provider's discretion.      For prescription refill requests, have your pharmacy contact our office and allow 72 hours for refills to be completed.    Today you received the following chemotherapy and/or immunotherapy agents darzalex    To help prevent nausea and vomiting after your treatment, we encourage you to take your nausea medication as directed.  BELOW ARE SYMPTOMS THAT SHOULD BE REPORTED IMMEDIATELY: *FEVER GREATER THAN 100.4 F (38 C) OR HIGHER *CHILLS OR SWEATING *NAUSEA AND VOMITING THAT IS NOT CONTROLLED WITH YOUR NAUSEA MEDICATION *UNUSUAL SHORTNESS OF BREATH *UNUSUAL BRUISING OR BLEEDING *URINARY PROBLEMS (pain or burning when urinating, or frequent urination) *BOWEL PROBLEMS (unusual diarrhea, constipation, pain near the anus) TENDERNESS IN MOUTH AND THROAT WITH OR WITHOUT PRESENCE OF ULCERS (sore throat, sores in mouth, or a toothache) UNUSUAL RASH, SWELLING OR PAIN  UNUSUAL VAGINAL DISCHARGE OR ITCHING   Items with * indicate a potential emergency and should be followed up as soon as possible or go to the Emergency Department if any problems should occur.  Please show the CHEMOTHERAPY ALERT CARD or IMMUNOTHERAPY ALERT CARD at check-in to  the Emergency Department and triage nurse.  Should you have questions after your visit or need to cancel or reschedule your appointment, please contact [facility] [place] [facility]  [phone] and follow the prompts.  Office hours are 8:00 a.m. to 4:30 p.m. Monday - Friday. Please note that voicemails left after 4:00 p.m. may not be returned until the following business day.  We are closed weekends and major holidays. You have access to a nurse at all times for urgent questions. Please call the main number to the clinic [phone] and follow the prompts.  For any non-urgent questions, you may also contact your provider using MyChart. We now offer e-Visits for anyone 18 and older to request care online for non-urgent symptoms. For details visit [email or web address].   Also download the MyChart app! Go to the app store, search "MyChart", open the app, select [facility], and log in with your MyChart username and password.    

## 2023-06-23 NOTE — Assessment & Plan Note (Signed)
Majority of his lucencies were not hypermetabolic on previous PET scan Xgeva monthly-  currently hold due to dental issue.  Continue calcium and vitamin D supplementation.

## 2023-06-23 NOTE — Assessment & Plan Note (Signed)
Treatment plan as listed above. 

## 2023-06-23 NOTE — Assessment & Plan Note (Signed)
Avoid nephrotoxin.  Encourage hydration. 
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
## 2023-06-23 NOTE — Assessment & Plan Note (Addendum)
Kappa Light chain multiple myeloma beta 2 microglobulin 5 and normal albumin.  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  06/04/2021 5% plasma cells -August 2022-February 2023, patient completed 7 cycles of daratumumab/Pomalyst/dexamethasone. 11/20/2021, status post bone marrow biopsy which showed 1% of plasma cells.  Likely he has minimal residual disease.  Currently on daratumumab maintenance. He takes his supply of dexamethasone 20mg  prior to his treatment.  Labs are reviewed and discussed with patient.  Lab Results  Component Value Date   MPROTEIN Not Observed 05/26/2023   KPAFRELGTCHN 41.4 (H) 05/26/2023   LAMBDASER 9.9 05/26/2023   KAPLAMBRATIO 4.18 (H) 05/26/2023    Today's level is pending.    Proceed with daratumumab today .  Repeat bone marrow biopsy showed 1 % plasma cells, kappa light chain, cytogenetics showed Y chromosome loss, negative myeloma FISH continue current regimen.

## 2023-06-24 LAB — KAPPA/LAMBDA LIGHT CHAINS
Kappa free light chain: 45.3 mg/L — ABNORMAL HIGH (ref 3.3–19.4)
Kappa, lambda light chain ratio: 6.38 — ABNORMAL HIGH (ref 0.26–1.65)
Lambda free light chains: 7.1 mg/L (ref 5.7–26.3)

## 2023-07-01 LAB — MULTIPLE MYELOMA PANEL, SERUM
Albumin SerPl Elph-Mcnc: 4.1 g/dL (ref 2.9–4.4)
Albumin/Glob SerPl: 1.8 — ABNORMAL HIGH (ref 0.7–1.7)
Alpha 1: 0.2 g/dL (ref 0.0–0.4)
Alpha2 Glob SerPl Elph-Mcnc: 0.7 g/dL (ref 0.4–1.0)
B-Globulin SerPl Elph-Mcnc: 1 g/dL (ref 0.7–1.3)
Gamma Glob SerPl Elph-Mcnc: 0.4 g/dL (ref 0.4–1.8)
Globulin, Total: 2.3 g/dL (ref 2.2–3.9)
IgA: 68 mg/dL (ref 61–437)
IgG (Immunoglobin G), Serum: 489 mg/dL — ABNORMAL LOW (ref 603–1613)
IgM (Immunoglobulin M), Srm: 11 mg/dL — ABNORMAL LOW (ref 20–172)
M Protein SerPl Elph-Mcnc: 0.1 g/dL — ABNORMAL HIGH
Total Protein ELP: 6.4 g/dL (ref 6.0–8.5)

## 2023-07-03 ENCOUNTER — Encounter: Payer: Self-pay | Admitting: Oncology

## 2023-07-03 ENCOUNTER — Telehealth: Payer: Self-pay

## 2023-07-03 ENCOUNTER — Other Ambulatory Visit (HOSPITAL_COMMUNITY): Payer: Self-pay

## 2023-07-03 ENCOUNTER — Other Ambulatory Visit: Payer: Self-pay | Admitting: Oncology

## 2023-07-03 ENCOUNTER — Telehealth: Payer: Self-pay | Admitting: Pharmacist

## 2023-07-03 DIAGNOSIS — C9000 Multiple myeloma not having achieved remission: Secondary | ICD-10-CM

## 2023-07-03 MED ORDER — LENALIDOMIDE 5 MG PO CAPS: 5.0000 mg | ORAL_CAPSULE | Freq: Every day | ORAL | Status: DC

## 2023-07-03 MED ORDER — LENALIDOMIDE 5 MG PO CAPS
5.0000 mg | ORAL_CAPSULE | Freq: Every day | ORAL | 0 refills | Status: DC
Start: 2023-07-03 — End: 2024-03-04

## 2023-07-03 NOTE — Telephone Encounter (Signed)
Oral Oncology Patient Advocate Encounter  New authorization   Received notification that prior authorization for Lenalidomide is required.   PA submitted on 07/03/23  Key [id number]  Status is pending     Ardeen Fillers, CPhT Oncology Pharmacy Patient Advocate  [facility]  [phone] (phone) [phone] (fax) 07/03/2023 1:57 PM

## 2023-07-03 NOTE — Telephone Encounter (Signed)
Clinical Pharmacist Practitioner Encounter   Received new prescription for Revlimid (lenalidomide) for the treatment of multiple myeloma in conjunction with daratumumab, planned duration until disease progression or unacceptable drug toxicity.  Of note, [facility] is considering Carvykti CAR-T in the future due to patient's slow disease progression on daratumumab.  CMP from 06/25/23 assessed, no relevant lab abnormalities. Prescription dose and frequency assessed.   Current medication list in Epic reviewed, no DDIs with lenalidomide identified.  Evaluated chart and no patient barriers to medication adherence identified.   Prescription has been e-scribed to the [facility] for benefits analysis and approval.  Oral Oncology Clinic will continue to follow for insurance authorization, copayment issues, initial counseling and start date.   Remi Haggard, PharmD, BCPS, BCOP, CPP Hematology/Oncology Clinical Pharmacist Practitioner [facility] [phone]  07/03/2023 2:25 PM

## 2023-07-03 NOTE — Telephone Encounter (Addendum)
Oral Oncology Patient Advocate Encounter  Was successful in securing patient a $12,000.00 grant from [facility] to provide copayment coverage for Lenalidomide.  This will keep the out of pocket expense at $0.     [employer] ID: [id number]   The billing information is as follows and has been shared with [facility].    RxBin: [id number] PCN: [id number] Member ID: [id number] Group ID: [id number] Dates of Eligibility: 06/03/23 through 06/01/24  Fund:  Multiple Myeloma - Medicare Access   Ardeen Fillers, CPhT Oncology Pharmacy Patient Advocate  [facility]  [phone] (phone) [phone] (fax) 07/03/2023 2:57 PM

## 2023-07-03 NOTE — Telephone Encounter (Addendum)
Oral Oncology Patient Advocate Encounter  Prior Authorization for Lenalidomide has been approved.    PA# [id number]  Effective dates: 07/03/23 through 10/14/23  Patients co-pay is $3,811.56.   Obtained [employer] to make co-pay $0.00.  Script and all supporting documents sent to [facility] for processing and fulfillment.    Ardeen Fillers, CPhT Oncology Pharmacy Patient Advocate  [facility]  [phone] (phone) [phone] (fax) 07/03/2023 2:18 PM

## 2023-07-03 NOTE — Telephone Encounter (Signed)
Clinical Pharmacist Practitioner Encounter   Patient Education I spoke with patient for overview of new oral chemotherapy medication: Revlimid (lenalidomide) for the treatment of multiple myeloma in conjunction with daratumumab, planned duration until disease progression or unacceptable drug toxicity.   Of note, [facility]ology is considering Carvykti CAR-T in the future due to patient's slow disease progression on daratumumab. Counseled patient on administration, dosing, side effects, monitoring, drug-food interactions, safe handling, storage, and disposal. Patient will take lenalidomide 1 capsule (5 mg total) by mouth daily. Take for 21 days, then hold for 7 days. Repeat every 28 days.  Patient reports stilling taking aspirin 81mg  daily  Side effects include but not limited to: rash, nausea, fatigue, constipation or diarrhea, decreased wbc/hgb/plt.    Reviewed with patient importance of keeping a medication schedule and plan for any missed doses.  After discussion with patient no patient barriers to medication adherence identified.   Luis Burke voiced understanding and appreciation. All questions answered. Medication handout provided.  Provided patient with Oral Chemotherapy Navigation [facility] phone number. Patient knows to call the office with questions or concerns. Oral Chemotherapy Navigation Clinic will continue to follow.  Luis Burke, PharmD, BCPS, BCOP, CPP Hematology/Oncology Clinical Pharmacist Practitioner [facility] [phone]  07/03/2023 3:32 PM

## 2023-07-03 NOTE — Telephone Encounter (Signed)
Patient successfully [employer] and drug education provided by pharmacist. Patient knows that script was sent to [facility] for fulfillment and to expect a call from them to setup delivery. Patient also knows to call me at [phone] with any questions or concerns regarding receiving medication or if there is any unexpected change in co-pay.    Ardeen Fillers, CPhT Oncology Pharmacy Patient Advocate  [facility]  [phone] (phone) [phone] (fax) 07/03/2023 3:36 PM

## 2023-07-04 ENCOUNTER — Other Ambulatory Visit (HOSPITAL_COMMUNITY): Payer: Self-pay

## 2023-07-07 NOTE — Telephone Encounter (Signed)
Per [employer] portal, [facility] dispensed lenalidomide on 07/07/23.

## 2023-07-08 ENCOUNTER — Other Ambulatory Visit: Payer: Self-pay

## 2023-07-08 ENCOUNTER — Other Ambulatory Visit: Payer: Self-pay | Admitting: *Deleted

## 2023-07-08 MED ORDER — PROCHLORPERAZINE MALEATE 10 MG PO TABS: 10.0000 mg | ORAL_TABLET | Freq: Four times a day (QID) | ORAL | 2 refills | Status: DC | PRN

## 2023-07-08 MED ORDER — ONDANSETRON HCL 8 MG PO TABS: 8.0000 mg | ORAL_TABLET | Freq: Three times a day (TID) | ORAL | 1 refills | Status: AC | PRN

## 2023-07-08 NOTE — Telephone Encounter (Signed)
Patient called reporting that his Revlimid is to be delivered today and is asking when he is t start taking it. Pease advise.  He also states that he remembers that it makes him nauseous sometimes so he needs a refill of his Zofran.

## 2023-07-21 ENCOUNTER — Inpatient Hospital Stay: Payer: Medicare Other

## 2023-07-21 ENCOUNTER — Inpatient Hospital Stay: Payer: Medicare Other | Attending: Oncology

## 2023-07-21 ENCOUNTER — Inpatient Hospital Stay (HOSPITAL_BASED_OUTPATIENT_CLINIC_OR_DEPARTMENT_OTHER): Payer: Medicare Other | Admitting: Oncology

## 2023-07-21 ENCOUNTER — Encounter: Payer: Self-pay | Admitting: Oncology

## 2023-07-21 VITALS — BP 118/78 | HR 65 | Temp 97.5°F | Resp 18 | Wt 206.4 lb

## 2023-07-21 VITALS — BP 134/79 | HR 73 | Temp 98.0°F | Resp 18

## 2023-07-21 DIAGNOSIS — Z5111 Encounter for antineoplastic chemotherapy: Secondary | ICD-10-CM | POA: Diagnosis not present

## 2023-07-21 DIAGNOSIS — C7951 Secondary malignant neoplasm of bone: Secondary | ICD-10-CM

## 2023-07-21 DIAGNOSIS — N1831 Chronic kidney disease, stage 3a: Secondary | ICD-10-CM

## 2023-07-21 DIAGNOSIS — Z23 Encounter for immunization: Secondary | ICD-10-CM | POA: Diagnosis not present

## 2023-07-21 DIAGNOSIS — C9000 Multiple myeloma not having achieved remission: Secondary | ICD-10-CM

## 2023-07-21 DIAGNOSIS — Z9484 Stem cells transplant status: Secondary | ICD-10-CM | POA: Insufficient documentation

## 2023-07-21 DIAGNOSIS — R771 Abnormality of globulin: Secondary | ICD-10-CM

## 2023-07-21 DIAGNOSIS — D696 Thrombocytopenia, unspecified: Secondary | ICD-10-CM | POA: Diagnosis not present

## 2023-07-21 DIAGNOSIS — Z5112 Encounter for antineoplastic immunotherapy: Secondary | ICD-10-CM | POA: Insufficient documentation

## 2023-07-21 DIAGNOSIS — Z9481 Bone marrow transplant status: Secondary | ICD-10-CM | POA: Diagnosis not present

## 2023-07-21 LAB — COMPREHENSIVE METABOLIC PANEL
ALT: 22 U/L (ref 0–44)
AST: 22 U/L (ref 15–41)
Albumin: 4.3 g/dL (ref 3.5–5.0)
Alkaline Phosphatase: 63 U/L (ref 38–126)
Anion gap: 7 (ref 5–15)
BUN: 17 mg/dL (ref 8–23)
CO2: 25 mmol/L (ref 22–32)
Calcium: 9 mg/dL (ref 8.9–10.3)
Chloride: 103 mmol/L (ref 98–111)
Creatinine, Ser: 1.38 mg/dL — ABNORMAL HIGH (ref 0.61–1.24)
GFR, Estimated: 56 mL/min — ABNORMAL LOW (ref >60)
Glucose, Bld: 105 mg/dL — ABNORMAL HIGH (ref 70–99)
Potassium: 3.8 mmol/L (ref 3.5–5.1)
Sodium: 135 mmol/L (ref 135–145)
Total Bilirubin: 0.8 mg/dL (ref 0.3–1.2)
Total Protein: 6.8 g/dL (ref 6.5–8.1)

## 2023-07-21 LAB — CBC WITH DIFFERENTIAL/PLATELET
Abs Immature Granulocytes: 0.02 10*3/uL (ref 0.00–0.07)
Basophils Absolute: 0 10*3/uL (ref 0.0–0.1)
Basophils Relative: 0 %
Eosinophils Absolute: 0.1 10*3/uL (ref 0.0–0.5)
Eosinophils Relative: 1 %
HCT: 40.5 % (ref 39.0–52.0)
Hemoglobin: 13.9 g/dL (ref 13.0–17.0)
Immature Granulocytes: 0 %
Lymphocytes Relative: 32 %
Lymphs Abs: 1.7 10*3/uL (ref 0.7–4.0)
MCH: 34.4 pg — ABNORMAL HIGH (ref 26.0–34.0)
MCHC: 34.3 g/dL (ref 30.0–36.0)
MCV: 100.2 fL — ABNORMAL HIGH (ref 80.0–100.0)
Monocytes Absolute: 0.7 10*3/uL (ref 0.1–1.0)
Monocytes Relative: 13 %
Neutro Abs: 2.8 10*3/uL (ref 1.7–7.7)
Neutrophils Relative %: 54 %
Platelets: 91 10*3/uL — ABNORMAL LOW (ref 150–400)
RBC: 4.04 MIL/uL — ABNORMAL LOW (ref 4.22–5.81)
RDW: 12.8 % (ref 11.5–15.5)
WBC: 5.2 10*3/uL (ref 4.0–10.5)
nRBC: 0 % (ref 0.0–0.2)

## 2023-07-21 MED ORDER — INFLUENZA VAC A&B SURF ANT ADJ 0.5 ML IM SUSY
0.5000 mL | PREFILLED_SYRINGE | Freq: Once | INTRAMUSCULAR | Status: AC
  Administered 2023-07-21: 0.5 mL via INTRAMUSCULAR
  Filled 2023-07-21: qty 0.5

## 2023-07-21 MED ORDER — DARATUMUMAB-HYALURONIDASE-FIHJ 1800-30000 MG-UT/15ML ~~LOC~~ SOLN
1800.0000 mg | Freq: Once | SUBCUTANEOUS | Status: AC
  Administered 2023-07-21: 1800 mg via SUBCUTANEOUS
  Filled 2023-07-21: qty 15
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
## 2023-07-21 NOTE — Assessment & Plan Note (Addendum)
Kappa Light chain multiple myeloma beta 2 microglobulin 5 and normal albumin.  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  06/04/2021 5% plasma cells -August 2022-February 2023, patient completed 7 cycles of daratumumab/Pomalyst/dexamethasone. 11/20/2021, status post bone marrow biopsy which showed 1% of plasma cells.  Likely he has minimal residual disease.  Currently on daratumumab maintenance. He takes his supply of dexamethasone 20mg  prior to his treatment.  Labs are reviewed and discussed with patient.  Lab Results  Component Value Date   MPROTEIN 0.1 (H) 06/23/2023   KPAFRELGTCHN 45.3 (H) 06/23/2023   LAMBDASER 7.1 06/23/2023   KAPLAMBRATIO 6.38 (H) 06/23/2023   Myeloma is slowly progressing (biochemical relapse), BM biopsy showed 1 % plasma cells, kappa light chain, cytogenetics showed Y chromosome loss, negative myeloma FISH Today's level is pending.   [facility] recommendation was reviewed. Added Revlimid 5mg  D1-21 Q28 days  Proceed with daratumumab today, finish current cycle of Revlimid. I recommend patient to repeat blood work on 10/28, and start next cycle of Revlimid on 10/29, in order to gradually synchronize his treatment with Daratumumab.

## 2023-07-21 NOTE — Assessment & Plan Note (Signed)
Avoid nephrotoxin.  Encourage hydration. 

## 2023-07-21 NOTE — Assessment & Plan Note (Signed)
IVIG if IgG <400   

## 2023-07-21 NOTE — Progress Notes (Signed)
Pt here for follow up. Reports feeling increased fatigue since he started taking Revlimid.

## 2023-07-21 NOTE — Assessment & Plan Note (Signed)
Decreased, will monitor counts.  Immature platelet fraction is low, likely due to marrow suppression.

## 2023-07-21 NOTE — Assessment & Plan Note (Signed)
Treatment plan as listed above. 

## 2023-07-21 NOTE — Assessment & Plan Note (Signed)
Majority of his lucencies were not hypermetabolic on previous PET scan Xgeva monthly-  currently hold due to dental issue.  Continue calcium and vitamin D supplementation.

## 2023-07-22 LAB — KAPPA/LAMBDA LIGHT CHAINS
Kappa free light chain: 45.8 mg/L — ABNORMAL HIGH (ref 3.3–19.4)
Kappa, lambda light chain ratio: 3.16 — ABNORMAL HIGH (ref 0.26–1.65)
Lambda free light chains: 14.5 mg/L (ref 5.7–26.3)

## 2023-07-23 LAB — MULTIPLE MYELOMA PANEL, SERUM
Albumin SerPl Elph-Mcnc: 3.8 g/dL (ref 2.9–4.4)
Albumin/Glob SerPl: 1.9 — ABNORMAL HIGH (ref 0.7–1.7)
Alpha 1: 0.2 g/dL (ref 0.0–0.4)
Alpha2 Glob SerPl Elph-Mcnc: 0.6 g/dL (ref 0.4–1.0)
B-Globulin SerPl Elph-Mcnc: 0.9 g/dL (ref 0.7–1.3)
Gamma Glob SerPl Elph-Mcnc: 0.3 g/dL — ABNORMAL LOW (ref 0.4–1.8)
Globulin, Total: 2.1 g/dL — ABNORMAL LOW (ref 2.2–3.9)
IgA: 86 mg/dL (ref 61–437)
IgG (Immunoglobin G), Serum: 469 mg/dL — ABNORMAL LOW (ref 603–1613)
IgM (Immunoglobulin M), Srm: 12 mg/dL — ABNORMAL LOW (ref 20–172)
Total Protein ELP: 5.9 g/dL — ABNORMAL LOW (ref 6.0–8.5)

## 2023-07-30 ENCOUNTER — Other Ambulatory Visit: Payer: Self-pay | Admitting: Family Medicine

## 2023-07-31 ENCOUNTER — Encounter: Payer: Self-pay | Admitting: Family Medicine

## 2023-07-31 ENCOUNTER — Ambulatory Visit: Payer: Medicare Other | Admitting: Family Medicine

## 2023-07-31 VITALS — BP 122/72 | HR 60 | Temp 98.8°F | Ht 68.5 in | Wt 206.2 lb

## 2023-07-31 DIAGNOSIS — Z0000 Encounter for general adult medical examination without abnormal findings: Secondary | ICD-10-CM | POA: Diagnosis not present

## 2023-07-31 DIAGNOSIS — I10 Essential (primary) hypertension: Secondary | ICD-10-CM

## 2023-07-31 DIAGNOSIS — N529 Male erectile dysfunction, unspecified: Secondary | ICD-10-CM

## 2023-07-31 DIAGNOSIS — Z7189 Other specified counseling: Secondary | ICD-10-CM | POA: Diagnosis not present

## 2023-07-31 DIAGNOSIS — C9000 Multiple myeloma not having achieved remission: Secondary | ICD-10-CM

## 2023-07-31 DIAGNOSIS — R42 Dizziness and giddiness: Secondary | ICD-10-CM

## 2023-07-31 DIAGNOSIS — L989 Disorder of the skin and subcutaneous tissue, unspecified: Secondary | ICD-10-CM

## 2023-07-31 DIAGNOSIS — E785 Hyperlipidemia, unspecified: Secondary | ICD-10-CM

## 2023-07-31 MED ORDER — LISINOPRIL 20 MG PO TABS: 20.0000 mg | ORAL_TABLET | Freq: Every day | ORAL | 3 refills | Status: DC

## 2023-07-31 MED ORDER — HYDRALAZINE HCL 10 MG PO TABS: ORAL_TABLET | ORAL | 3 refills | Status: DC

## 2023-07-31 MED ORDER — AMLODIPINE BESYLATE 5 MG PO TABS: 10.0000 mg | ORAL_TABLET | Freq: Every day | ORAL | 3 refills | Status: DC

## 2023-07-31 MED ORDER — PRAVASTATIN SODIUM 10 MG PO TABS: 10.0000 mg | ORAL_TABLET | Freq: Every day | ORAL | 3 refills | Status: DC

## 2023-07-31 MED ORDER — SILDENAFIL CITRATE 20 MG PO TABS: 20.0000 mg | ORAL_TABLET | Freq: Every day | ORAL | 5 refills | Status: DC | PRN

## 2023-07-31 NOTE — Assessment & Plan Note (Signed)
Continue sildenafil as needed.

## 2023-07-31 NOTE — Assessment & Plan Note (Signed)
Continue pravastatin.  Labs discussed with patient.

## 2023-07-31 NOTE — Patient Instructions (Signed)
Don't change your meds for now.  Use the bedside exercise if you have more vertigo.  Update me as needed.  Take care.  Glad to see you.

## 2023-07-31 NOTE — Assessment & Plan Note (Signed)
Flu 2024 Shingles previously done PNA previously done Tetanus 2019 Colonoscopy deferred given current hematology evaluation that is ongoing. Prostate cancer screening 2024 per urology. Advance directive-wife designated if patient were incapacitated. Cognitive function addressed- see scanned forms- and if abnormal then additional documentation follows.

## 2023-07-31 NOTE — Assessment & Plan Note (Signed)
Hematology f/u d/w pt.  Followed by outside clinic.  I'll defer. He agrees.  He is going to have dental f/u, d/w pt.

## 2023-07-31 NOTE — Assessment & Plan Note (Signed)
Single event.  No other neurologic symptoms with the event.  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  Discussed pathophysiology.  If he has recurrent symptoms he can use home bedside exercise for vertigo and update me as needed.

## 2023-07-31 NOTE — Assessment & Plan Note (Signed)
Continue amlodipine hydralazine metoprolol lisinopril.  Labs discussed with patient.

## 2023-07-31 NOTE — Assessment & Plan Note (Signed)
Wife designated if patient were incapacitated.

## 2023-07-31 NOTE — Progress Notes (Signed)
I have personally reviewed the Medicare Annual Wellness questionnaire and have noted 1. The patient's medical and social history 2. Their use of alcohol, tobacco or illicit drugs 3. Their current medications and supplements 4. The patient's functional ability including ADL's, fall risks, home safety risks and hearing or visual             impairment. 5. Diet and physical activities 6. Evidence for depression or mood disorders  The patients weight, height, BMI have been recorded in the chart and visual acuity is per eye clinic.  I have made referrals, counseling and provided education to the patient based review of the above and I have provided the pt with a written personalized care plan for preventive services.  Provider list updated- see scanned forms.  Routine anticipatory guidance given to patient.  See health maintenance. The possibility exists that previously documented standard health maintenance information may have been brought forward from a previous encounter into this note.  If needed, that same information has been updated to reflect the current situation based on today's encounter.    Flu 2024 Shingles previously done PNA previously done Tetanus 2019 Colonoscopy deferred given current hematology evaluation that is ongoing. Prostate cancer screening 2024 per urology. Advance directive-wife designated if patient were incapacitated. Cognitive function addressed- see scanned forms- and if abnormal then additional documentation follows.   In addition to [facility], follow up visit for the below conditions:  He had episode of vertigo, quick onset, quick relief.  Single episode.  No symptoms now.  He had sensation of motion not lightheadedness.  No other concurrent focal neurologic symptoms.  Hypertension:    Using medication without problems or lightheadedness: yes Chest pain with exertion:no Edema:no Short of breath:no  Elevated Cholesterol: Using medications without  problems: yes Muscle aches: no Diet compliance: d/w pt.  Exercise: d/w pt.  Prev labs d/w pt.   Hematology f/u d/w pt.  Followed by outside clinic.  I'll defer. He agrees.  He is going to have dental f/u, d/w pt.    ED improved with sildenafil.  No NTG use.    He had lesion removed per dermatology, R cheek, healing in the meantime.  He is awaiting a call about MOHS surgery.    Granddaughter is in 11th grade, working at [employer].  She is doing well.  PMH and SH reviewed  Meds, vitals, and allergies reviewed.   ROS: Per HPI.  Unless specifically indicated otherwise in HPI, the patient denies:  General: fever. Eyes: acute vision changes ENT: sore throat Cardiovascular: chest pain Respiratory: SOB GI: vomiting GU: dysuria Musculoskeletal: acute back pain Derm: acute rash Neuro: acute motor dysfunction Psych: worsening mood Endocrine: polydipsia Heme: bleeding Allergy: hayfever  GEN: nad, alert and oriented HEENT: mucous membranes moist, TM wnl B NECK: supple w/o LA CV: rrr. PULM: ctab, no inc wob ABD: soft, +bs EXT: no edema SKIN: no acute rash but healing excision site on the R side of the face

## 2023-07-31 NOTE — Assessment & Plan Note (Signed)
He had lesion removed per dermatology, R cheek, healing in the meantime.  He is awaiting a call about MOHS surgery.   He can update me as needed.

## 2023-08-04 ENCOUNTER — Telehealth: Payer: Self-pay | Admitting: *Deleted

## 2023-08-04 NOTE — Telephone Encounter (Signed)
pT CALLED REPORTING THAT dR cHOI CALLED HIM AND TOLD HIOM TO STOP THE rEVLIMID. pT HAS A LAB APPOINTMENT mONDAY AND IS TO RESTART rEVLIMID tuESDAY , BUT WITH dR cHOI TELLING HIM NOT TO TAKE IT HE IS ASKING IF HE NEEDS TO COME mONDAY AND JUST COME IN  nOV AS PLANNED AND HAVE LABS WHEN HE GETS HIS CHEMOTHERAPY. Please ADVISE

## 2023-08-04 NOTE — Telephone Encounter (Signed)
Pt will not need labs on 10/28. He will just keep appts in Nov   Please cancel lab on 10/28.

## 2023-08-04 NOTE — Telephone Encounter (Signed)
Call returned to patient and informed that he does not  need to come in on 10/28 but to keep 11/4 appointment. He repeated this back to me

## 2023-08-04 NOTE — Telephone Encounter (Signed)
So he needs to come for  lab appointment 10/18 and again on 11/4?

## 2023-08-07 ENCOUNTER — Other Ambulatory Visit: Payer: Self-pay | Admitting: Family Medicine

## 2023-08-07 NOTE — Telephone Encounter (Signed)
Patient has called office and was very upset that we have not refilled yet. States that he was told when he was here for his physical. Patient was demanding that reception bring this back her to address right now. Advised them that I am unclear on when he was due for refill due to the way that it looks in chart. I do not feel comfortable calling in at this time. I am sending for Dr. Para March to advise on refill. If any issues we will reach out to him.

## 2023-08-07 NOTE — Telephone Encounter (Signed)
Called patient let him know has been called in. No further questions.

## 2023-08-07 NOTE — Telephone Encounter (Signed)
Patient called back "for the 4th time today" per patient. He was very aggressive to the reception and I was asked to take call. Patient started out telling me that he has called 4 times and wanted to know why his medication has not been filled. I explained to him that we did not receive request until today. That it has been sent to Dr.Duncan high priority for review. Patent wanted to know why it has not been addressed. Informed him that provider is in with patients. Patient asked me "did he not get a lunch" advised patient that our policy is 3 days for refills. Patient requested call from my boss. "I have been a patient there a long time and someone is going to hear about this." Advised patient that I would send message to our administrator so that she is able to address the issue. He did request my name as well as reception and I have given patient both names.

## 2023-08-07 NOTE — Telephone Encounter (Signed)
I sent the rx.  I worked through lunch today.  I am addressing refills as soon as I can.

## 2023-08-07 NOTE — Telephone Encounter (Signed)
Patient called in to follow up on this refill request. He stated that he is currently out of the medication.

## 2023-08-11 ENCOUNTER — Other Ambulatory Visit: Payer: Medicare Other

## 2023-08-12 ENCOUNTER — Other Ambulatory Visit: Payer: Self-pay | Admitting: *Deleted

## 2023-08-12 DIAGNOSIS — C9000 Multiple myeloma not having achieved remission: Secondary | ICD-10-CM

## 2023-08-18 ENCOUNTER — Inpatient Hospital Stay: Payer: Medicare Other

## 2023-08-18 ENCOUNTER — Telehealth: Payer: Self-pay

## 2023-08-18 ENCOUNTER — Encounter: Payer: Self-pay | Admitting: Oncology

## 2023-08-18 ENCOUNTER — Inpatient Hospital Stay (HOSPITAL_BASED_OUTPATIENT_CLINIC_OR_DEPARTMENT_OTHER): Payer: Medicare Other | Admitting: Oncology

## 2023-08-18 ENCOUNTER — Inpatient Hospital Stay: Payer: Medicare Other | Attending: Oncology

## 2023-08-18 VITALS — BP 152/91 | HR 67 | Temp 97.6°F | Resp 18 | Wt 205.6 lb

## 2023-08-18 DIAGNOSIS — Z9481 Bone marrow transplant status: Secondary | ICD-10-CM | POA: Diagnosis not present

## 2023-08-18 DIAGNOSIS — C9000 Multiple myeloma not having achieved remission: Secondary | ICD-10-CM | POA: Diagnosis not present

## 2023-08-18 DIAGNOSIS — N1831 Chronic kidney disease, stage 3a: Secondary | ICD-10-CM | POA: Diagnosis not present

## 2023-08-18 DIAGNOSIS — Z5111 Encounter for antineoplastic chemotherapy: Secondary | ICD-10-CM

## 2023-08-18 DIAGNOSIS — Z9484 Stem cells transplant status: Secondary | ICD-10-CM | POA: Insufficient documentation

## 2023-08-18 DIAGNOSIS — Z5112 Encounter for antineoplastic immunotherapy: Secondary | ICD-10-CM | POA: Insufficient documentation

## 2023-08-18 DIAGNOSIS — D696 Thrombocytopenia, unspecified: Secondary | ICD-10-CM | POA: Insufficient documentation

## 2023-08-18 DIAGNOSIS — C7951 Secondary malignant neoplasm of bone: Secondary | ICD-10-CM

## 2023-08-18 LAB — COMPREHENSIVE METABOLIC PANEL
ALT: 20 U/L (ref 0–44)
AST: 23 U/L (ref 15–41)
Albumin: 4.8 g/dL (ref 3.5–5.0)
Alkaline Phosphatase: 70 U/L (ref 38–126)
Anion gap: 10 (ref 5–15)
BUN: 22 mg/dL (ref 8–23)
CO2: 24 mmol/L (ref 22–32)
Calcium: 9.4 mg/dL (ref 8.9–10.3)
Chloride: 103 mmol/L (ref 98–111)
Creatinine, Ser: 1.47 mg/dL — ABNORMAL HIGH (ref 0.61–1.24)
GFR, Estimated: 52 mL/min — ABNORMAL LOW (ref >60)
Glucose, Bld: 100 mg/dL — ABNORMAL HIGH (ref 70–99)
Potassium: 3.7 mmol/L (ref 3.5–5.1)
Sodium: 137 mmol/L (ref 135–145)
Total Bilirubin: 0.9 mg/dL (ref <1.2)
Total Protein: 7.5 g/dL (ref 6.5–8.1)

## 2023-08-18 LAB — CBC WITH DIFFERENTIAL/PLATELET
Abs Immature Granulocytes: 0.02 10*3/uL (ref 0.00–0.07)
Basophils Absolute: 0 10*3/uL (ref 0.0–0.1)
Basophils Relative: 0 %
Eosinophils Absolute: 0.1 10*3/uL (ref 0.0–0.5)
Eosinophils Relative: 1 %
HCT: 44.3 % (ref 39.0–52.0)
Hemoglobin: 15.1 g/dL (ref 13.0–17.0)
Immature Granulocytes: 0 %
Lymphocytes Relative: 31 %
Lymphs Abs: 1.7 10*3/uL (ref 0.7–4.0)
MCH: 33.9 pg (ref 26.0–34.0)
MCHC: 34.1 g/dL (ref 30.0–36.0)
MCV: 99.6 fL (ref 80.0–100.0)
Monocytes Absolute: 0.3 10*3/uL (ref 0.1–1.0)
Monocytes Relative: 6 %
Neutro Abs: 3.4 10*3/uL (ref 1.7–7.7)
Neutrophils Relative %: 62 %
Platelets: 95 10*3/uL — ABNORMAL LOW (ref 150–400)
RBC: 4.45 MIL/uL (ref 4.22–5.81)
RDW: 12.5 % (ref 11.5–15.5)
WBC: 5.5 10*3/uL (ref 4.0–10.5)
nRBC: 0 % (ref 0.0–0.2)

## 2023-08-18 MED ORDER — DARATUMUMAB-HYALURONIDASE-FIHJ 1800-30000 MG-UT/15ML ~~LOC~~ SOLN
1800.0000 mg | Freq: Once | SUBCUTANEOUS | Status: AC
  Administered 2023-08-18: 1800 mg via SUBCUTANEOUS
  Filled 2023-08-18: qty 15
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
## 2023-08-18 NOTE — Assessment & Plan Note (Signed)
Treatment plan as listed above. 

## 2023-08-18 NOTE — Assessment & Plan Note (Signed)
Majority of his lucencies were not hypermetabolic on previous PET scan Xgeva monthly-  currently hold due to dental issue, waiting for dental clearance.  Continue calcium and vitamin D supplementation.

## 2023-08-18 NOTE — Assessment & Plan Note (Signed)
Avoid nephrotoxin.  Encourage hydration. 

## 2023-08-18 NOTE — Assessment & Plan Note (Signed)
Decreased, will monitor counts.  Immature platelet fraction is low, likely due to marrow suppression.

## 2023-08-18 NOTE — Telephone Encounter (Signed)
Dental clearance for Xgeva faxed to [facility]. Fax confirmation received.   Ph: [phone] Fax: [phone]

## 2023-08-18 NOTE — Assessment & Plan Note (Addendum)
Kappa Light chain multiple myeloma beta 2 microglobulin 5 and normal albumin.  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  06/04/2021 5% plasma cells -August 2022-February 2023, patient completed 7 cycles of daratumumab/Pomalyst/dexamethasone. 11/20/2021, status post bone marrow biopsy which showed 1% of plasma cells.  Likely he has minimal residual disease.  Currently on daratumumab maintenance. He takes his supply of dexamethasone 20mg  prior to his treatment.  Labs are reviewed and discussed with patient.  Lab Results  Component Value Date   MPROTEIN Not Observed 07/21/2023   KPAFRELGTCHN 45.8 (H) 07/21/2023   LAMBDASER 14.5 07/21/2023   KAPLAMBRATIO 3.16 (H) 07/21/2023   Myeloma is slowly progressing (biochemical relapse), BM biopsy showed 1 % plasma cells, kappa light chain, cytogenetics showed Y chromosome loss, negative myeloma FISH Today's level is pending.   [facility] recommendation was reviewed.he did not tolerate Revlimid 5mg  D1-21 Q28 days, off Revlimid currently.  Proceed with daratumumab today Discussed with [facility] Dr. Alvino Chapel, will continue monitor myeloma labs. If progressing there is plan for Carvykti CAR-T.

## 2023-08-19 ENCOUNTER — Encounter: Payer: Self-pay | Admitting: Oncology

## 2023-08-19 LAB — KAPPA/LAMBDA LIGHT CHAINS
Kappa free light chain: 52.2 mg/L — ABNORMAL HIGH (ref 3.3–19.4)
Kappa, lambda light chain ratio: 6.37 — ABNORMAL HIGH (ref 0.26–1.65)
Lambda free light chains: 8.2 mg/L (ref 5.7–26.3)

## 2023-08-19 NOTE — Telephone Encounter (Signed)
Dental clearance received and ok to proceed with Xgeva. Please contact pt to schedule Xgeva Injection one day this week. Pt aware of plan

## 2023-08-19 NOTE — Telephone Encounter (Signed)
Spoke to pt, who states that he is scheduled for skin cancer exision on Friday and they told him they would need to go deep into the skin. Pt is concerned that Nadine Counts will interfere with healing. Discussed with Dr. Cathie Hoops and we will hold off on Xgeva and schedule it at next visit. Pt aware of plan.

## 2023-08-21 LAB — MULTIPLE MYELOMA PANEL, SERUM
Albumin SerPl Elph-Mcnc: 4.3 g/dL (ref 2.9–4.4)
Albumin/Glob SerPl: 1.9 — ABNORMAL HIGH (ref 0.7–1.7)
Alpha 1: 0.2 g/dL (ref 0.0–0.4)
Alpha2 Glob SerPl Elph-Mcnc: 0.7 g/dL (ref 0.4–1.0)
B-Globulin SerPl Elph-Mcnc: 1 g/dL (ref 0.7–1.3)
Gamma Glob SerPl Elph-Mcnc: 0.4 g/dL (ref 0.4–1.8)
Globulin, Total: 2.3 g/dL (ref 2.2–3.9)
IgA: 89 mg/dL (ref 61–437)
IgG (Immunoglobin G), Serum: 569 mg/dL — ABNORMAL LOW (ref 603–1613)
IgM (Immunoglobulin M), Srm: 12 mg/dL — ABNORMAL LOW (ref 20–172)
Total Protein ELP: 6.6 g/dL (ref 6.0–8.5)

## 2023-08-26 ENCOUNTER — Other Ambulatory Visit: Payer: Self-pay | Admitting: Oncology

## 2023-09-15 ENCOUNTER — Encounter: Payer: Self-pay | Admitting: Oncology

## 2023-09-15 ENCOUNTER — Inpatient Hospital Stay (HOSPITAL_BASED_OUTPATIENT_CLINIC_OR_DEPARTMENT_OTHER): Payer: Medicare Other | Admitting: Oncology

## 2023-09-15 ENCOUNTER — Inpatient Hospital Stay: Payer: Medicare Other | Attending: Oncology

## 2023-09-15 ENCOUNTER — Inpatient Hospital Stay: Payer: Medicare Other

## 2023-09-15 VITALS — BP 153/83 | HR 71 | Temp 96.9°F | Resp 18 | Wt 211.5 lb

## 2023-09-15 DIAGNOSIS — C7951 Secondary malignant neoplasm of bone: Secondary | ICD-10-CM | POA: Insufficient documentation

## 2023-09-15 DIAGNOSIS — D696 Thrombocytopenia, unspecified: Secondary | ICD-10-CM | POA: Diagnosis not present

## 2023-09-15 DIAGNOSIS — N1831 Chronic kidney disease, stage 3a: Secondary | ICD-10-CM

## 2023-09-15 DIAGNOSIS — Z9481 Bone marrow transplant status: Secondary | ICD-10-CM | POA: Diagnosis not present

## 2023-09-15 DIAGNOSIS — Z8042 Family history of malignant neoplasm of prostate: Secondary | ICD-10-CM | POA: Insufficient documentation

## 2023-09-15 DIAGNOSIS — Z800 Family history of malignant neoplasm of digestive organs: Secondary | ICD-10-CM | POA: Insufficient documentation

## 2023-09-15 DIAGNOSIS — C9000 Multiple myeloma not having achieved remission: Secondary | ICD-10-CM

## 2023-09-15 DIAGNOSIS — Z5111 Encounter for antineoplastic chemotherapy: Secondary | ICD-10-CM

## 2023-09-15 DIAGNOSIS — Z5112 Encounter for antineoplastic immunotherapy: Secondary | ICD-10-CM | POA: Insufficient documentation

## 2023-09-15 LAB — CBC WITH DIFFERENTIAL/PLATELET
Abs Immature Granulocytes: 0.02 10*3/uL (ref 0.00–0.07)
Basophils Absolute: 0 10*3/uL (ref 0.0–0.1)
Basophils Relative: 0 %
Eosinophils Absolute: 0.1 10*3/uL (ref 0.0–0.5)
Eosinophils Relative: 1 %
HCT: 39.8 % (ref 39.0–52.0)
Hemoglobin: 13.7 g/dL (ref 13.0–17.0)
Immature Granulocytes: 0 %
Lymphocytes Relative: 32 %
Lymphs Abs: 1.6 10*3/uL (ref 0.7–4.0)
MCH: 34.7 pg — ABNORMAL HIGH (ref 26.0–34.0)
MCHC: 34.4 g/dL (ref 30.0–36.0)
MCV: 100.8 fL — ABNORMAL HIGH (ref 80.0–100.0)
Monocytes Absolute: 0.3 10*3/uL (ref 0.1–1.0)
Monocytes Relative: 6 %
Neutro Abs: 3.2 10*3/uL (ref 1.7–7.7)
Neutrophils Relative %: 61 %
Platelets: 82 10*3/uL — ABNORMAL LOW (ref 150–400)
RBC: 3.95 MIL/uL — ABNORMAL LOW (ref 4.22–5.81)
RDW: 13.2 % (ref 11.5–15.5)
WBC: 5.2 10*3/uL (ref 4.0–10.5)
nRBC: 0 % (ref 0.0–0.2)

## 2023-09-15 LAB — COMPREHENSIVE METABOLIC PANEL
ALT: 20 U/L (ref 0–44)
AST: 20 U/L (ref 15–41)
Albumin: 4.2 g/dL (ref 3.5–5.0)
Alkaline Phosphatase: 49 U/L (ref 38–126)
Anion gap: 11 (ref 5–15)
BUN: 17 mg/dL (ref 8–23)
CO2: 24 mmol/L (ref 22–32)
Calcium: 8.5 mg/dL — ABNORMAL LOW (ref 8.9–10.3)
Chloride: 103 mmol/L (ref 98–111)
Creatinine, Ser: 1.29 mg/dL — ABNORMAL HIGH (ref 0.61–1.24)
GFR, Estimated: >60 mL/min (ref >60)
Glucose, Bld: 132 mg/dL — ABNORMAL HIGH (ref 70–99)
Potassium: 3.7 mmol/L (ref 3.5–5.1)
Sodium: 138 mmol/L (ref 135–145)
Total Bilirubin: 0.8 mg/dL (ref <1.2)
Total Protein: 6.5 g/dL (ref 6.5–8.1)

## 2023-09-15 MED ORDER — DARATUMUMAB-HYALURONIDASE-FIHJ 1800-30000 MG-UT/15ML ~~LOC~~ SOLN
1800.0000 mg | Freq: Once | SUBCUTANEOUS | Status: AC
Start: 2023-09-15 — End: 2023-09-15
  Administered 2023-09-15: 1800 mg via SUBCUTANEOUS
  Filled 2023-09-15: qty 15

## 2023-09-15 NOTE — Progress Notes (Signed)
Pt took premedications at home prior to treatment

## 2023-09-15 NOTE — Assessment & Plan Note (Signed)
Treatment plan as listed above. 

## 2023-09-15 NOTE — Assessment & Plan Note (Addendum)
Majority of his lucencies were not hypermetabolic on previous PET scan Xgeva monthly-  hold due to hypocalcemia Continue calcium and vitamin D supplementation.

## 2023-09-15 NOTE — Assessment & Plan Note (Addendum)
Kappa Light chain multiple myeloma beta 2 microglobulin 5 and normal albumin.  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  06/04/2021 5% plasma cells -August 2022-February 2023, patient completed 7 cycles of daratumumab/Pomalyst/dexamethasone. 11/20/2021, status post bone marrow biopsy which showed 1% of plasma cells.  Likely he has minimal residual disease.  Currently on daratumumab maintenance. He takes his supply of dexamethasone 20mg  prior to his treatment.  Labs are reviewed and discussed with patient.  Lab Results  Component Value Date   MPROTEIN Not Observed 08/18/2023   KPAFRELGTCHN 52.2 (H) 08/18/2023   LAMBDASER 8.2 08/18/2023   KAPLAMBRATIO 6.37 (H) 08/18/2023   Myeloma is slowly progressing (biochemical relapse), BM biopsy showed 1 % plasma cells, kappa light chain, cytogenetics showed Y chromosome loss, negative myeloma FISH Today's level is pending.   [facility] recommendation was reviewed.he did not tolerate Revlimid 5mg  D1-21 Q28 days, off Revlimid currently.  Proceed with daratumumab today Discussed with [facility] Dr. Alvino Chapel, pending approval for Carvykti CAR-T.

## 2023-09-15 NOTE — Assessment & Plan Note (Signed)
Decreased, will monitor counts.  Immature platelet fraction is low, likely due to marrow suppression.

## 2023-09-15 NOTE — Patient Instructions (Signed)
[facility] - A DEPT OF [facility]  Discharge Instructions: Thank you for choosing [facility] to provide your oncology and hematology care.  If you have a lab appointment with the Cancer Center, please go directly to the Cancer Center and check in at the registration area.  Wear comfortable clothing and clothing appropriate for easy access to any Portacath or PICC line.   We strive to give you quality time with your provider. You may need to reschedule your appointment if you arrive late (15 or more minutes).  Arriving late affects you and other patients whose appointments are after yours.  Also, if you miss three or more appointments without notifying the office, you may be dismissed from the clinic at the provider's discretion.      For prescription refill requests, have your pharmacy contact our office and allow 72 hours for refills to be completed.    Today you received the following chemotherapy and/or immunotherapy agents darzalex    To help prevent nausea and vomiting after your treatment, we encourage you to take your nausea medication as directed.  BELOW ARE SYMPTOMS THAT SHOULD BE REPORTED IMMEDIATELY: *FEVER GREATER THAN 100.4 F (38 C) OR HIGHER *CHILLS OR SWEATING *NAUSEA AND VOMITING THAT IS NOT CONTROLLED WITH YOUR NAUSEA MEDICATION *UNUSUAL SHORTNESS OF BREATH *UNUSUAL BRUISING OR BLEEDING *URINARY PROBLEMS (pain or burning when urinating, or frequent urination) *BOWEL PROBLEMS (unusual diarrhea, constipation, pain near the anus) TENDERNESS IN MOUTH AND THROAT WITH OR WITHOUT PRESENCE OF ULCERS (sore throat, sores in mouth, or a toothache) UNUSUAL RASH, SWELLING OR PAIN  UNUSUAL VAGINAL DISCHARGE OR ITCHING   Items with * indicate a potential emergency and should be followed up as soon as possible or go to the Emergency Department if any problems should occur.  Please show the CHEMOTHERAPY ALERT CARD or IMMUNOTHERAPY ALERT  CARD at check-in to the Emergency Department and triage nurse.  Should you have questions after your visit or need to cancel or reschedule your appointment, please contact [facility] - A DEPT OF [facility]  [phone] and follow the prompts.  Office hours are 8:00 a.m. to 4:30 p.m. Monday - Friday. Please note that voicemails left after 4:00 p.m. may not be returned until the following business day.  We are closed weekends and major holidays. You have access to a nurse at all times for urgent questions. Please call the main number to the clinic [phone] and follow the prompts.  For any non-urgent questions, you may also contact your provider using MyChart. We now offer e-Visits for anyone 100 and older to request care online for non-urgent symptoms. For details visit [email or web address].   Also download the MyChart app! Go to the app store, search "MyChart", open the app, select , and log in with your MyChart username and password.

## 2023-09-15 NOTE — Assessment & Plan Note (Signed)
Avoid nephrotoxin.  Encourage hydration. 
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
## 2023-09-16 LAB — KAPPA/LAMBDA LIGHT CHAINS
Kappa free light chain: 54.2 mg/L — ABNORMAL HIGH (ref 3.3–19.4)
Kappa, lambda light chain ratio: 9.03 — ABNORMAL HIGH (ref 0.26–1.65)
Lambda free light chains: 6 mg/L (ref 5.7–26.3)

## 2023-09-18 LAB — MULTIPLE MYELOMA PANEL, SERUM
Albumin SerPl Elph-Mcnc: 3.8 g/dL (ref 2.9–4.4)
Albumin/Glob SerPl: 1.9 — ABNORMAL HIGH (ref 0.7–1.7)
Alpha 1: 0.2 g/dL (ref 0.0–0.4)
Alpha2 Glob SerPl Elph-Mcnc: 0.6 g/dL (ref 0.4–1.0)
B-Globulin SerPl Elph-Mcnc: 0.9 g/dL (ref 0.7–1.3)
Gamma Glob SerPl Elph-Mcnc: 0.3 g/dL — ABNORMAL LOW (ref 0.4–1.8)
Globulin, Total: 2.1 g/dL — ABNORMAL LOW (ref 2.2–3.9)
IgA: 63 mg/dL (ref 61–437)
IgG (Immunoglobin G), Serum: 484 mg/dL — ABNORMAL LOW (ref 603–1613)
IgM (Immunoglobulin M), Srm: 10 mg/dL — ABNORMAL LOW (ref 20–172)
Total Protein ELP: 5.9 g/dL — ABNORMAL LOW (ref 6.0–8.5)

## 2023-10-13 ENCOUNTER — Inpatient Hospital Stay: Payer: Medicare Other

## 2023-10-13 ENCOUNTER — Inpatient Hospital Stay (HOSPITAL_BASED_OUTPATIENT_CLINIC_OR_DEPARTMENT_OTHER): Payer: Medicare Other | Admitting: Oncology

## 2023-10-13 ENCOUNTER — Encounter: Payer: Self-pay | Admitting: Oncology

## 2023-10-13 VITALS — BP 144/87 | HR 66 | Temp 97.7°F | Resp 18 | Wt 207.5 lb

## 2023-10-13 DIAGNOSIS — Z5111 Encounter for antineoplastic chemotherapy: Secondary | ICD-10-CM

## 2023-10-13 DIAGNOSIS — N1831 Chronic kidney disease, stage 3a: Secondary | ICD-10-CM

## 2023-10-13 DIAGNOSIS — C9000 Multiple myeloma not having achieved remission: Secondary | ICD-10-CM

## 2023-10-13 DIAGNOSIS — N179 Acute kidney failure, unspecified: Secondary | ICD-10-CM

## 2023-10-13 DIAGNOSIS — Z5112 Encounter for antineoplastic immunotherapy: Secondary | ICD-10-CM | POA: Diagnosis not present

## 2023-10-13 DIAGNOSIS — C7951 Secondary malignant neoplasm of bone: Secondary | ICD-10-CM

## 2023-10-13 DIAGNOSIS — D696 Thrombocytopenia, unspecified: Secondary | ICD-10-CM

## 2023-10-13 LAB — CBC WITH DIFFERENTIAL/PLATELET
Abs Immature Granulocytes: 0.01 10*3/uL (ref 0.00–0.07)
Basophils Absolute: 0 10*3/uL (ref 0.0–0.1)
Basophils Relative: 0 %
Eosinophils Absolute: 0.1 10*3/uL (ref 0.0–0.5)
Eosinophils Relative: 2 %
HCT: 41.9 % (ref 39.0–52.0)
Hemoglobin: 14.3 g/dL (ref 13.0–17.0)
Immature Granulocytes: 0 %
Lymphocytes Relative: 35 %
Lymphs Abs: 1.8 10*3/uL (ref 0.7–4.0)
MCH: 34.3 pg — ABNORMAL HIGH (ref 26.0–34.0)
MCHC: 34.1 g/dL (ref 30.0–36.0)
MCV: 100.5 fL — ABNORMAL HIGH (ref 80.0–100.0)
Monocytes Absolute: 0.4 10*3/uL (ref 0.1–1.0)
Monocytes Relative: 8 %
Neutro Abs: 2.8 10*3/uL (ref 1.7–7.7)
Neutrophils Relative %: 55 %
Platelets: 80 10*3/uL — ABNORMAL LOW (ref 150–400)
RBC: 4.17 MIL/uL — ABNORMAL LOW (ref 4.22–5.81)
RDW: 13.3 % (ref 11.5–15.5)
WBC: 5.1 10*3/uL (ref 4.0–10.5)
nRBC: 0 % (ref 0.0–0.2)

## 2023-10-13 LAB — COMPREHENSIVE METABOLIC PANEL
ALT: 21 U/L (ref 0–44)
AST: 21 U/L (ref 15–41)
Albumin: 4.6 g/dL (ref 3.5–5.0)
Alkaline Phosphatase: 59 U/L (ref 38–126)
Anion gap: 10 (ref 5–15)
BUN: 21 mg/dL (ref 8–23)
CO2: 26 mmol/L (ref 22–32)
Calcium: 10.1 mg/dL (ref 8.9–10.3)
Chloride: 103 mmol/L (ref 98–111)
Creatinine, Ser: 1.34 mg/dL — ABNORMAL HIGH (ref 0.61–1.24)
GFR, Estimated: 58 mL/min — ABNORMAL LOW (ref >60)
Glucose, Bld: 102 mg/dL — ABNORMAL HIGH (ref 70–99)
Potassium: 3.9 mmol/L (ref 3.5–5.1)
Sodium: 139 mmol/L (ref 135–145)
Total Bilirubin: 0.6 mg/dL (ref <1.2)
Total Protein: 7.4 g/dL (ref 6.5–8.1)

## 2023-10-13 MED ORDER — DARATUMUMAB-HYALURONIDASE-FIHJ 1800-30000 MG-UT/15ML ~~LOC~~ SOLN
1800.0000 mg | Freq: Once | SUBCUTANEOUS | Status: AC
  Administered 2023-10-13: 1800 mg via SUBCUTANEOUS
  Filled 2023-10-13: qty 15

## 2023-10-13 MED ORDER — DENOSUMAB 120 MG/1.7ML ~~LOC~~ SOLN
120.0000 mg | Freq: Once | SUBCUTANEOUS | Status: AC
  Administered 2023-10-13: 120 mg via SUBCUTANEOUS
  Filled 2023-10-13: qty 1.7

## 2023-10-13 NOTE — Patient Instructions (Signed)
[facility] - A DEPT OF [facility]  Discharge Instructions: Thank you for choosing [facility] to provide your oncology and hematology care.  If you have a lab appointment with the Cancer Center, please go directly to the Cancer Center and check in at the registration area.  Wear comfortable clothing and clothing appropriate for easy access to any Portacath or PICC line.   We strive to give you quality time with your provider. You may need to reschedule your appointment if you arrive late (15 or more minutes).  Arriving late affects you and other patients whose appointments are after yours.  Also, if you miss three or more appointments without notifying the office, you may be dismissed from the clinic at the provider's discretion.      For prescription refill requests, have your pharmacy contact our office and allow 72 hours for refills to be completed.    Today you received the following chemotherapy and/or immunotherapy agents XGEVA and DARZALEX      To help prevent nausea and vomiting after your treatment, we encourage you to take your nausea medication as directed.  BELOW ARE SYMPTOMS THAT SHOULD BE REPORTED IMMEDIATELY: *FEVER GREATER THAN 100.4 F (38 C) OR HIGHER *CHILLS OR SWEATING *NAUSEA AND VOMITING THAT IS NOT CONTROLLED WITH YOUR NAUSEA MEDICATION *UNUSUAL SHORTNESS OF BREATH *UNUSUAL BRUISING OR BLEEDING *URINARY PROBLEMS (pain or burning when urinating, or frequent urination) *BOWEL PROBLEMS (unusual diarrhea, constipation, pain near the anus) TENDERNESS IN MOUTH AND THROAT WITH OR WITHOUT PRESENCE OF ULCERS (sore throat, sores in mouth, or a toothache) UNUSUAL RASH, SWELLING OR PAIN  UNUSUAL VAGINAL DISCHARGE OR ITCHING   Items with * indicate a potential emergency and should be followed up as soon as possible or go to the Emergency Department if any problems should occur.  Please show the CHEMOTHERAPY ALERT CARD or  IMMUNOTHERAPY ALERT CARD at check-in to the Emergency Department and triage nurse.  Should you have questions after your visit or need to cancel or reschedule your appointment, please contact [facility] - A DEPT OF [facility]  [phone] and follow the prompts.  Office hours are 8:00 a.m. to 4:30 p.m. Monday - Friday. Please note that voicemails left after 4:00 p.m. may not be returned until the following business day.  We are closed weekends and major holidays. You have access to a nurse at all times for urgent questions. Please call the main number to the clinic [phone] and follow the prompts.  For any non-urgent questions, you may also contact your provider using MyChart. We now offer e-Visits for anyone 95 and older to request care online for non-urgent symptoms. For details visit [email or web address].   Also download the MyChart app! Go to the app store, search "MyChart", open the app, select [facility], and log in with your MyChart username and password.  Denosumab Injection (Oncology) What is this medication? DENOSUMAB (den oh SUE mab) prevents weakened bones caused by cancer. It may also be used to treat noncancerous bone tumors that cannot be removed by surgery. It can also be used to treat high calcium levels in the blood caused by cancer. It works by blocking a protein that causes bones to break down quickly. This slows down the release of calcium from bones, which lowers calcium levels in your blood. It also makes your bones stronger and less likely to break (fracture). This medicine may be used  for other purposes; ask your health care provider or pharmacist if you have questions. COMMON BRAND NAME(S): XGEVA What should I tell my care team before I take this medication? They need to know if you have any of these conditions: Dental disease Having surgery or tooth extraction Infection Kidney disease Low levels of calcium or vitamin D in the  blood Malnutrition On hemodialysis Skin conditions or sensitivity Thyroid or parathyroid disease An unusual reaction to denosumab, other medications, foods, dyes, or preservatives Pregnant or trying to get pregnant Breast-feeding How should I use this medication? This medication is for injection under the skin. It is given by your care team in a hospital or clinic setting. A special MedGuide will be given to you before each treatment. Be sure to read this information carefully each time. Talk to your care team about the use of this medication in children. While it may be prescribed for children as young as 13 years for selected conditions, precautions do apply. Overdosage: If you think you have taken too much of this medicine contact a poison control center or emergency room at once. NOTE: This medicine is only for you. Do not share this medicine with others. What if I miss a dose? Keep appointments for follow-up doses. It is important not to miss your dose. Call your care team if you are unable to keep an appointment. What may interact with this medication? Do not take this medication with any of the following: Other medications containing denosumab This medication may also interact with the following: Medications that lower your chance of fighting infection Steroid medications, such as prednisone or cortisone This list may not describe all possible interactions. Give your health care provider a list of all the medicines, herbs, non-prescription drugs, or dietary supplements you use. Also tell them if you smoke, drink alcohol, or use illegal drugs. Some items may interact with your medicine. What should I watch for while using this medication? Your condition will be monitored carefully while you are receiving this medication. You may need blood work while taking this medication. This medication may increase your risk of getting an infection. Call your care team for advice if you get a  fever, chills, sore throat, or other symptoms of a cold or flu. Do not treat yourself. Try to avoid being around people who are sick. You should make sure you get enough calcium and vitamin D while you are taking this medication, unless your care team tells you not to. Discuss the foods you eat and the vitamins you take with your care team. Some people who take this medication have severe bone, joint, or muscle pain. This medication may also increase your risk for jaw problems or a broken thigh bone. Tell your care team right away if you have severe pain in your jaw, bones, joints, or muscles. Tell your care team if you have any pain that does not go away or that gets worse. Talk to your care team if you may be pregnant. Serious birth defects can occur if you take this medication during pregnancy and for 5 months after the last dose. You will need a negative pregnancy test before starting this medication. Contraception is recommended while taking this medication and for 5 months after the last dose. Your care team can help you find the option that works for you. What side effects may I notice from receiving this medication? Side effects that you should report to your care team as soon as possible: Allergic reactions--skin rash,  itching, hives, swelling of the face, lips, tongue, or throat Bone, joint, or muscle pain Low calcium level--muscle pain or cramps, confusion, tingling, or numbness in the hands or feet Osteonecrosis of the jaw--pain, swelling, or redness in the mouth, numbness of the jaw, poor healing after dental work, unusual discharge from the mouth, visible bones in the mouth Side effects that usually do not require medical attention (report to your care team if they continue or are bothersome): Cough Diarrhea Fatigue Headache Nausea This list may not describe all possible side effects. Call your doctor for medical advice about side effects. You may report side effects to FDA at  1-800-FDA-1088. Where should I keep my medication? This medication is given in a hospital or clinic. It will not be stored at home. NOTE: This sheet is a summary. It may not cover all possible information. If you have questions about this medicine, talk to your doctor, pharmacist, or health care provider.  2024 Elsevier/Gold Standard (2022-02-20 00:00:00)  Daratumumab Injection What is this medication? DARATUMUMAB (dar a toom ue mab) treats multiple myeloma, a type of bone marrow cancer. It works by helping your immune system slow or stop the spread of cancer cells. It is a monoclonal antibody. This medicine may be used for other purposes; ask your health care provider or pharmacist if you have questions. COMMON BRAND NAME(S): DARZALEX What should I tell my care team before I take this medication? They need to know if you have any of these conditions: Hereditary fructose intolerance Infection, such as chickenpox, herpes, hepatitis B Lung or breathing disease, such as asthma, COPD An unusual or allergic reaction to daratumumab, sorbitol, other medications, foods, dyes, or preservatives Pregnant or trying to get pregnant Breastfeeding How should I use this medication? This medication is injected into a vein. It is given by your care team in a hospital or clinic setting. Talk to your care team about the use of this medication in children. Special care may be needed. Overdosage: If you think you have taken too much of this medicine contact a poison control center or emergency room at once. NOTE: This medicine is only for you. Do not share this medicine with others. What if I miss a dose? Keep appointments for follow-up doses. It is important not to miss your dose. Call your care team if you are unable to keep an appointment. What may interact with this medication? Interactions have not been studied. This list may not describe all possible interactions. Give your health care provider a list  of all the medicines, herbs, non-prescription drugs, or dietary supplements you use. Also tell them if you smoke, drink alcohol, or use illegal drugs. Some items may interact with your medicine. What should I watch for while using this medication? Your condition will be monitored carefully while you are receiving this medication. This medication can cause serious allergic reactions. To reduce your risk, your care team may give you other medication to take before receiving this one. Be sure to follow the directions from your care team. This medication can affect the results of blood tests to match your blood type. These changes can last for up to 6 months after the final dose. Your care team will do blood tests to match your blood type before you start treatment. Tell all of your care team that you are being treated with this medication before receiving a blood transfusion. This medication can affect the results of some tests used to determine treatment response; extra tests may  be needed to evaluate response. Talk to your care team if you wish to become pregnant or think you are pregnant. This medication can cause serious birth defects if taken during pregnancy and for 3 months after the last dose. A reliable form of contraception is recommended while taking this medication and for 3 months after the last dose. Talk to your care team about effective forms of contraception. Do not breast-feed while taking this medication. What side effects may I notice from receiving this medication? Side effects that you should report to your care team as soon as possible: Allergic reactions--skin rash, itching, hives, swelling of the face, lips, tongue, or throat Infection--fever, chills, cough, sore throat, wounds that don't heal, pain or trouble when passing urine, general feeling of discomfort or being unwell Infusion reactions--chest pain, shortness of breath or trouble breathing, feeling faint or  lightheaded Unusual bruising or bleeding Side effects that usually do not require medical attention (report to your care team if they continue or are bothersome): Constipation Diarrhea Fatigue Nausea Pain, tingling, or numbness in the hands or feet Swelling of the ankles, hands, or feet This list may not describe all possible side effects. Call your doctor for medical advice about side effects. You may report side effects to FDA at 1-800-FDA-1088. Where should I keep my medication? This medication is given in a hospital or clinic. It will not be stored at home. NOTE: This sheet is a summary. It may not cover all possible information. If you have questions about this medicine, talk to your doctor, pharmacist, or health care provider.  2024 Elsevier/Gold Standard (2022-08-08 00:00:00)

## 2023-10-13 NOTE — Assessment & Plan Note (Signed)
Avoid nephrotoxin.  Encourage hydration. 

## 2023-10-13 NOTE — Assessment & Plan Note (Signed)
Treatment plan as listed above. 

## 2023-10-13 NOTE — Assessment & Plan Note (Signed)
Kappa Light chain multiple myeloma beta 2 microglobulin 5 and normal albumin.  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  06/04/2021 5% plasma cells -August 2022-February 2023, patient completed 7 cycles of daratumumab/Pomalyst/dexamethasone. 11/20/2021, status post bone marrow biopsy which showed 1% of plasma cells.  Likely he has minimal residual disease.  Currently on daratumumab maintenance. He takes his supply of dexamethasone 20mg  prior to his treatment.  Labs are reviewed and discussed with patient.  Lab Results  Component Value Date   MPROTEIN Not Observed 09/15/2023   KPAFRELGTCHN 54.2 (H) 09/15/2023   LAMBDASER 6.0 09/15/2023   KAPLAMBRATIO 9.03 (H) 09/15/2023   Myeloma is slowly progressing (biochemical relapse), BM biopsy showed 1 % plasma cells, kappa light chain, cytogenetics showed Y chromosome loss, negative myeloma FISH Today's level is pending.   [facility] recommendation was reviewed.he did not tolerate Revlimid 5mg  D1-21 Q28 days, off Revlimid currently.  Proceed with daratumumab today Discussed with [facility] Dr. Alvino Chapel, pending approval for Carvykti CAR-T.

## 2023-10-13 NOTE — Assessment & Plan Note (Signed)
Decreased, will monitor counts.  Immature platelet fraction is low, likely due to marrow suppression.

## 2023-10-13 NOTE — Assessment & Plan Note (Signed)
Majority of his lucencies were not hypermetabolic on previous PET scan Xgeva monthly- Continue calcium and vitamin D supplementation.  
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
## 2023-10-14 LAB — KAPPA/LAMBDA LIGHT CHAINS
Kappa free light chain: 75.1 mg/L — ABNORMAL HIGH (ref 3.3–19.4)
Kappa, lambda light chain ratio: 8.53 — ABNORMAL HIGH (ref 0.26–1.65)
Lambda free light chains: 8.8 mg/L (ref 5.7–26.3)

## 2023-10-20 ENCOUNTER — Inpatient Hospital Stay (HOSPITAL_BASED_OUTPATIENT_CLINIC_OR_DEPARTMENT_OTHER): Payer: Medicare Other | Admitting: Hospice and Palliative Medicine

## 2023-10-20 ENCOUNTER — Inpatient Hospital Stay: Payer: Medicare Other | Attending: Oncology

## 2023-10-20 ENCOUNTER — Encounter: Payer: Self-pay | Admitting: Hospice and Palliative Medicine

## 2023-10-20 ENCOUNTER — Ambulatory Visit
Admission: RE | Admit: 2023-10-20 | Discharge: 2023-10-20 | Disposition: A | Discharge status: Home / Self Care | Payer: Medicare Other | Source: Ambulatory Visit | Attending: Hospice and Palliative Medicine | Admitting: Hospice and Palliative Medicine

## 2023-10-20 ENCOUNTER — Other Ambulatory Visit: Payer: Self-pay

## 2023-10-20 ENCOUNTER — Telehealth: Payer: Self-pay | Admitting: *Deleted

## 2023-10-20 VITALS — BP 193/105 | HR 94 | Temp 99.9°F | Resp 19 | Wt 209.9 lb

## 2023-10-20 DIAGNOSIS — R918 Other nonspecific abnormal finding of lung field: Secondary | ICD-10-CM | POA: Diagnosis not present

## 2023-10-20 DIAGNOSIS — J069 Acute upper respiratory infection, unspecified: Secondary | ICD-10-CM | POA: Diagnosis not present

## 2023-10-20 DIAGNOSIS — C9000 Multiple myeloma not having achieved remission: Secondary | ICD-10-CM | POA: Insufficient documentation

## 2023-10-20 DIAGNOSIS — J029 Acute pharyngitis, unspecified: Secondary | ICD-10-CM | POA: Insufficient documentation

## 2023-10-20 DIAGNOSIS — Z9484 Stem cells transplant status: Secondary | ICD-10-CM | POA: Insufficient documentation

## 2023-10-20 DIAGNOSIS — R059 Cough, unspecified: Secondary | ICD-10-CM | POA: Diagnosis not present

## 2023-10-20 DIAGNOSIS — Z9481 Bone marrow transplant status: Secondary | ICD-10-CM | POA: Insufficient documentation

## 2023-10-20 DIAGNOSIS — R0981 Nasal congestion: Secondary | ICD-10-CM | POA: Diagnosis not present

## 2023-10-20 LAB — RESPIRATORY PANEL BY PCR

## 2023-10-20 LAB — CMP (CANCER CENTER ONLY)
ALT: 21 U/L (ref 0–44)
AST: 22 U/L (ref 15–41)
Albumin: 4.5 g/dL (ref 3.5–5.0)
Alkaline Phosphatase: 62 U/L (ref 38–126)
Anion gap: 9 (ref 5–15)
BUN: 20 mg/dL (ref 8–23)
CO2: 27 mmol/L (ref 22–32)
Calcium: 9.8 mg/dL (ref 8.9–10.3)
Chloride: 102 mmol/L (ref 98–111)
Creatinine: 1.42 mg/dL — ABNORMAL HIGH (ref 0.61–1.24)
GFR, Estimated: 54 mL/min — ABNORMAL LOW (ref >60)
Glucose, Bld: 121 mg/dL — ABNORMAL HIGH (ref 70–99)
Potassium: 4.9 mmol/L (ref 3.5–5.1)
Sodium: 138 mmol/L (ref 135–145)
Total Bilirubin: 0.9 mg/dL (ref 0.0–1.2)
Total Protein: 7.2 g/dL (ref 6.5–8.1)

## 2023-10-20 LAB — CBC WITH DIFFERENTIAL (CANCER CENTER ONLY)
Abs Immature Granulocytes: 0.05 10*3/uL (ref 0.00–0.07)
Basophils Absolute: 0 10*3/uL (ref 0.0–0.1)
Basophils Relative: 0 %
Eosinophils Absolute: 0.2 10*3/uL (ref 0.0–0.5)
Eosinophils Relative: 2 %
HCT: 43.2 % (ref 39.0–52.0)
Hemoglobin: 14.7 g/dL (ref 13.0–17.0)
Immature Granulocytes: 1 %
Lymphocytes Relative: 16 %
Lymphs Abs: 1.5 10*3/uL (ref 0.7–4.0)
MCH: 34.3 pg — ABNORMAL HIGH (ref 26.0–34.0)
MCHC: 34 g/dL (ref 30.0–36.0)
MCV: 100.7 fL — ABNORMAL HIGH (ref 80.0–100.0)
Monocytes Absolute: 0.8 10*3/uL (ref 0.1–1.0)
Monocytes Relative: 9 %
Neutro Abs: 6.9 10*3/uL (ref 1.7–7.7)
Neutrophils Relative %: 72 %
Platelet Count: 78 10*3/uL — ABNORMAL LOW (ref 150–400)
RBC: 4.29 MIL/uL (ref 4.22–5.81)
RDW: 13.3 % (ref 11.5–15.5)
WBC Count: 9.5 10*3/uL (ref 4.0–10.5)
nRBC: 0 % (ref 0.0–0.2)

## 2023-10-20 MED ORDER — AMOXICILLIN-POT CLAVULANATE 875-125 MG PO TABS: 1.0000 | ORAL_TABLET | Freq: Two times a day (BID) | ORAL | 0 refills | Status: DC

## 2023-10-20 NOTE — Progress Notes (Signed)
 Symptom Management Clinic [facility] at [facility] Telephone:([phone]) [phone] [phone]  Patient Care Team: Cleatus Arlyss RAMAN, MD as PCP - General (Family Medicine) Babara Call, MD as Consulting Physician (Oncology) Robinette Curtistine Honey, MD as Referring Physician (Hematology and Oncology) Rojelio Clear, MD as Referring Physician (Hematology and Oncology)   NAME OF PATIENT: Luis Burke  [id number]  01/18/1956   DATE OF VISIT: 10/20/23  REASON FOR CONSULT: KATE SWEETMAN is a 68 y.o. male with multiple medical problems including multiple myeloma status post stem cell transplant with early relapse, most recently on maintenance Dara.  Thought to have progression has been referred for CAR-T.  INTERVAL HISTORY: Patient presents [facility] today for evaluation of productive cough, congestion, and sore throat.  Patient also having muscle aches but denies fever or chills.  Productive cough with green phlegm.  Denies shortness of breath.  Thought he had an episode of wheezing.  Denies known sick contacts.  Has history of chronic sinusitis but is not had a sinus infection in about a year.  Reports good appetite and denies weight loss. Denies chest pain. Denies any nausea, vomiting, constipation, or diarrhea. Denies urinary complaints. Patient offers no further specific complaints today.  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   Fatty liver    on u/s 07/2018   Foot fracture    Hyperlipidemia 03/2004   Hypertension 1994   Multiple myeloma (HCC)    Snores     PAST SURGICAL HISTORY:  Past Surgical History:  Procedure Laterality Date   CATARACT EXTRACTION Left 10/10/2021   CATARACT EXTRACTION W/PHACO Right 03/04/2018   Procedure: CATARACT EXTRACTION PHACO AND INTRAOCULAR LENS PLACEMENT  (IOC)  RIGHT TORIC;  Surgeon: Mittie Gaskin, MD;  Location: [facility];  Service: Ophthalmology;  Laterality: Right;  Per Hope no Toric Lens 1:45 5.3   CATARACT EXTRACTION W/PHACO Left 10/10/2021   Procedure: CATARACT EXTRACTION PHACO AND INTRAOCULAR LENS PLACEMENT (IOC) LEFT;  Surgeon: Mittie Gaskin, MD;  Location: [facility];  Service: Ophthalmology;  Laterality: Left;  5.16 1:03.8   COLONOSCOPY WITH PROPOFOL  N/A 07/20/2018   Procedure: COLONOSCOPY WITH PROPOFOL ;  Surgeon: Therisa Bi, MD;  Location: [facility] END[facility];  Service: Gastroenterology;  Laterality: N/A;   ESOPHAGOGASTRODUODENOSCOPY (EGD) WITH PROPOFOL  N/A 07/20/2018   Procedure: ESOPHAGOGASTRODUODENOSCOPY (EGD) WITH PROPOFOL ;  Surgeon: Therisa Bi, MD;  Location: [facility] END[facility]COPY;  Service: Gastroenterology;  Laterality: N/A;   EYE SURGERY Right    HERNIA REPAIR     IR BONE MARROW BIOPSY & ASPIRATION  05/08/2023   L Lap herniorraphy  04/2000   Left wrist ganglionectomy      HEMATOLOGY/ONCOLOGY HISTORY:  Oncology History  Multiple myeloma (HCC)  08/19/2018 Bone Marrow Biopsy   08/19/2018 bone marrow biopsy showed hypercellular marrow 80%, involved by plasma cell neoplasm up to 95%.  Consistent with plasma cell myeloma. Myeloma FISH  gain of gain of CEP12   08/25/2018 Imaging   PET scan showed no definite hypermetabolic bone disease but CT findings are highly suspicious for numerous small myelomatous lesions involving the spine, sternum and scattered ribs.   08/26/2018 Initial Diagnosis   Multiple myeloma  # 07/27/2018 multiple myeloma panel showed M protein of 0.1, IgG 662, IgA 49, IgM 7. 08/10/2018, free light chain ratio showed extremely high level of kappa free  light chain 10,183, with a kappa lambda light chain ratio of 1414.31,LDH 164, Beta-2  microglobulin 5     08/28/2018 - 03/17/2019 Chemotherapy   RVD x 9     04/23/2019 Bone Marrow Transplant   autologous stem cell bone marrow transplant at  [facility]  He received preparative regimen with melphalan 200 mg/m on 04/22/2019 followed by autologous stem cell infusion on 04/23/2019. Transplant course was complicated with febrile neutropenia grade 3, treated with vancomycin  and cephapirin until engraftment on 05/06/2019. Patient also had engraftment syndrome and had to be started on Solu-Medrol  25 mg twice daily on 05/05/2019.  Transition to Medrol  Dosepak on 05/07/2019 to complete steroid taper as outpatient.  re-vaccination 12 months post transplant   08/10/2019 - 05/2022 Chemotherapy    Ixazomib 3mg  on D1/D8/D15 out of 28 day cycle       07/12/2020 Imaging   CT skeletal survey done at [facility] 1.  Lucent lesion in the L4 spinous process measuring 4 mm which is  nonspecific. Consider confirmation of marrow replacing process with MRI.  2.  Incompletely characterized renal cysts    07/28/2020 Bone Marrow Biopsy    bone marrow normocellular marrow with polytypic plasmacytosis, 3 to 8%   Patient is in remission.  Cytogenetics negative for trisomy 12.  Negative myeloma FISH panel.   08/05/2020 Imaging   MRI lumbar spine with and without contrast was done at [facility] showed no evidence of spinal metastasis.  Lumbar spondylosis is most pronounced at L5-S1 where there is severe right and moderate left neural foraminal stenosis. 08/11/2020, x-ray cervical spine complete with flexion and extension showed The cervical spine is visualized from C1 to the top of T1 on the lateral  view.  Reversal of the normal cervical lordosis with a focal kyphosis centered at the C5-C6 intervertebral level. Trace anterolisthesis of C4 onto C5. Trace retrolisthesis of C6 on C7. No evidence of dynamic instability is noted on  the flexion or extension views.   No prevertebral soft tissue swelling. Cervical vertebral body heights are maintained. Multilevel degenerative changes of the cervical spine including discogenic disease, most pronounced at C5-C6 and C6-C7, and facet  arthropathy most pronounced at C4-C5. Multilevel mild to moderate left neural foraminal osseous narrowing of the mid to lower cervical vertebrae, possibly centrally/artifactual by patient positioning.     05/15/2021 Bone Marrow Biopsy   Bone marrow biopsy showed slightly hypercellular bone marrow for age with slight plasmacytosis.  5% plasma cells with kappa light chain restriction.  Cytogenetics normal.  Myeloma FISH negative I discussed with [facility] Dr.Choi. we both feel that his bone marrow biopsy result reflects early relapse. Recommend stop Ixazomib. Switch to second line treatment with Daratumumab  Pomlyst Dexamethasone    05/31/2021 - 05/20/2022 Chemotherapy   Patient is on Treatment Plan : MYELOMA Daratumumab  + Pomalidomide  + Dexamethasone  q28d x 7 cycles     05/31/2021 -  Chemotherapy   Patient is on Treatment Plan : MYELOMA Daratumumab  IV + Pomalidomide  + Dexamethasone  q28d x 7 cycles     09/21/2021 Imaging    PET scan showed no evidence of suspicious or new hypermetabolism, scattered small lucent foci seen previously in the sternum and the spine are less prominent today and have resolved in some regions.  Aortic atherosclerosis.   11/18/2021 Imaging   MRI cervical spine with and without contrast showed no multiple myeloma lesion or marrow edema identified in the cervical spine.  Abnormal dural thickening and a/or abnormal epidural space at C4/C5 levels.  This is nonspecific.  ER physician discussed the case with neurosurgery Dr. Katrina who feels that this is more likely degenerative disc disease.   11/20/2021 Bone Marrow Biopsy   bone marrow biopsy which showed 1% of plasma cells. The plasma cells generally display polyclonal staining pattern for kappa and lambda light chains although a few very small clusters appear kappa light chain restricted.  The findings are very limited but most suggestive of minimal residual plasma cell neoplasm especially in the presence of monoclonal protein  with kappa light chain specificity. Normal cytogenetics and myeloma FISH   12/20/2021 -  Chemotherapy   Daratumumab  monthly maintenance  # I discussed with patient's [facility] oncologist Dr. Rojelio.  We have agreed upon de-escalating his regimen to Daratumumab  monthly maintenance for now. Discontinue pomalyst .     05/08/2023 Bone Marrow Biopsy   Bone marrow biopsy showed  Hypercellular bone marrow with trilineage hematopoiesis and 1% plasma cells  The bone marrow is hypercellular for age with trilineage hematopoiesis including abundant megakaryocytes some of which display atypical morphology.  I suspect that the latter changes are secondary in nature in this setting.  The plasma cells represent 1% of all cells including focally atypical large forms but with lack of large clusters or sheets. The atypical plasma cells display kappa light chain predominance/restriction most suggestive of minimal residual plasma cell neoplasm.  cytogenetics showed Y chromosome loss, negative myeloma FISH   Metastasis to bone (HCC)    ALLERGIES:  is allergic to bactrim  [sulfamethoxazole -trimethoprim], clonidine  derivatives, and tadalafil.  MEDICATIONS:  Current Outpatient Medications  Medication Sig Dispense Refill   acetaminophen  (TYLENOL ) 325 MG tablet Take 2 tablets (650 mg total) by mouth See admin instructions. 1 hour prior to chemotherapy treatments 30 tablet 0   acyclovir  (ZOVIRAX ) 400 MG tablet TAKE 1 TABLET(400 MG) BY MOUTH TWICE DAILY 60 tablet 2   amLODipine  (NORVASC ) 5 MG tablet Take 2 tablets (10 mg total) by mouth daily. 180 tablet 3   amoxicillin -clavulanate (AUGMENTIN ) 875-125 MG tablet Take 1 tablet by mouth 2 (two) times daily. 20 tablet 0   ASPIRIN  81 PO Take 81 mg by mouth daily.     B Complex Vitamins (VITAMIN B COMPLEX PO) Take 1 Dose by mouth every other day.     co-enzyme Q-10 50 MG capsule Take 50 mg by mouth daily.     [employer] VITAMIN B12 1000 MCG tablet TAKE 1 TABLET BY MOUTH EVERY DAY 90 tablet  3   dexamethasone  (DECADRON ) 4 MG tablet Take 5 tablets (20 mg total) by mouth as directed. Take one hour before monthly Darzalex  injection. 45 tablet 1   diphenhydrAMINE  (BENADRYL ) 50 MG tablet Take 1 tablet (50 mg total) by mouth See admin instructions. Take 1 tablet 1 hour prior to chemotherapy treatments.     hydrALAZINE  (APRESOLINE ) 10 MG tablet TAKE 1 TABLET(10 MG) BY MOUTH THREE TIMES DAILY 270 tablet 3   lisinopril  (ZESTRIL ) 20 MG tablet Take 1 tablet (20 mg total) by mouth daily. 90 tablet 3   magnesium  chloride (SLOW-MAG) 64 MG TBEC SR tablet Take 1 tablet (64 mg total) by mouth daily. 60 tablet 0   metoprolol  succinate (TOPROL -XL) 50 MG 24 hr tablet TAKE 2 TABLETS BY MOUTH TWICE A DAY WITH OR IMMEDIATELY FOLLOWING A MEAL 360 tablet 3   ondansetron  (ZOFRAN ) 8 MG tablet Take 1 tablet (8 mg total) by mouth every 8 (eight) hours as needed for nausea or vomiting. 30 tablet 1   pravastatin  (PRAVACHOL ) 10 MG tablet Take 1 tablet (10  mg total) by mouth daily. 90 tablet 3   prochlorperazine  (COMPAZINE ) 10 MG tablet Take 1 tablet (10 mg total) by mouth every 6 (six) hours as needed for nausea or vomiting. 30 tablet 2   sildenafil  (REVATIO ) 20 MG tablet Take 1-5 tablets (20-100 mg total) by mouth daily as needed. 12 tablet 5   zinc gluconate 50 MG tablet Take 50 mg by mouth daily.     lenalidomide  (REVLIMID ) 5 MG capsule Take 1 capsule (5 mg total) by mouth daily. Take for 21 days, then hold for 7 days. Repeat every 28 days. (Patient not taking: Reported on 10/20/2023) 21 capsule 0   No current facility-administered medications for this visit.    VITAL SIGNS: BP (!) 193/105   Pulse 94   Temp 99.9 F (37.7 C)   Resp 19   Wt 209 lb 14.4 oz (95.2 kg)   SpO2 98%   BMI 31.45 kg/m  Filed Weights   10/20/23 1125  Weight: 209 lb 14.4 oz (95.2 kg)    Estimated body mass index is 31.45 kg/m as calculated from the following:   Height as of 07/31/23: 5' 8.5 (1.74 m).   Weight as of this  encounter: 209 lb 14.4 oz (95.2 kg).  LABS: CBC:    Component Value Date/Time   WBC 9.5 10/20/2023 1121   WBC 5.1 10/13/2023 0846   HGB 14.7 10/20/2023 1121   HCT 43.2 10/20/2023 1121   PLT 78 (L) 10/20/2023 1121   MCV 100.7 (H) 10/20/2023 1121   NEUTROABS 6.9 10/20/2023 1121   LYMPHSABS 1.5 10/20/2023 1121   MONOABS 0.8 10/20/2023 1121   EOSABS 0.2 10/20/2023 1121   BASOSABS 0.0 10/20/2023 1121   Comprehensive Metabolic Panel:    Component Value Date/Time   NA 139 10/13/2023 0846   K 3.9 10/13/2023 0846   CL 103 10/13/2023 0846   CO2 26 10/13/2023 0846   BUN 21 10/13/2023 0846   CREATININE 1.34 (H) 10/13/2023 0846   GLUCOSE 102 (H) 10/13/2023 0846   CALCIUM 10.1 10/13/2023 0846   AST 21 10/13/2023 0846   ALT 21 10/13/2023 0846   ALKPHOS 59 10/13/2023 0846   BILITOT 0.6 10/13/2023 0846   PROT 7.4 10/13/2023 0846   ALBUMIN 4.6 10/13/2023 0846    RADIOGRAPHIC STUDIES: DG Chest 2 View Result Date: 10/20/2023 CLINICAL DATA:  Chest congestion and cough. History of multiple myeloma. EXAM: CHEST - 2 VIEW COMPARISON:  Chest two views 08/12/2022 FINDINGS: Cardiac silhouette and mediastinal contours are within limits. Mildly decreased lung volumes, worsened from prior. Mild bibasilar bronchovascular crowding. No pleural effusion or pneumothorax. Moderate multilevel degenerative disc changes of the thoracic spine. IMPRESSION: Mildly decreased lung volumes with mild bibasilar bronchovascular crowding. Electronically Signed   By: Tanda Lyons M.D.   On: 10/20/2023 10:59    PERFORMANCE STATUS (ECOG) : 1 - Symptomatic but completely ambulatory  Review of Systems Unless otherwise noted, a complete review of systems is negative.  Physical Exam General: NAD HEENT: No cervical lymphadenopathy, OP red without exudate, nasal congestion Cardiovascular: regular rate and rhythm Pulmonary: clear anterior/posterior fields Abdomen: soft, nontender, + bowel sounds GU: no suprapubic  tenderness Extremities: no edema, no joint deformities Skin: no rashes Neurological: nonfocal  IMPRESSION/PLAN: Multiple myeloma -has been referred for CAR-T due to disease progression  URI -chest x-ray shows mild decreased lung volumes and mild bibasilar bronchovascular crowding.  Symptoms most consistent with viral URI.  However, patient is immunocompromised in the light of progression of multiple myeloma,  will empirically cover him with antibiotics.  Augmentin  twice daily x 10 days.  Respiratory panel sent to lab.  ED triggers reviewed in detail.  Discussed symptomatic/supportive care.  Recommended increase fluids and rest.  Case and plan discussed with Dr. Babara   Patient expressed understanding and was in agreement with this plan. He also understands that He can call clinic at any time with any questions, concerns, or complaints.   Thank you for allowing me to participate in the care of this very pleasant patient.   Time Total: 20 minutes  Visit consisted of counseling and education dealing with the complex and emotionally intense issues of symptom management in the setting of serious illness.Greater than 50%  of this time was spent counseling and coordinating care related to the above assessment and plan.  Signed by: Fonda Mower, PhD, NP-C

## 2023-10-20 NOTE — Progress Notes (Signed)
 Patient states he has had chest congestion and a sore throat since Saturday 10/18/2023. Patient states he has some aches and pains in the lower back.

## 2023-10-20 NOTE — Telephone Encounter (Signed)
 Appointment scheduled and patient coming in at 1130 today

## 2023-10-20 NOTE — Telephone Encounter (Signed)
 Patient called reporting that he has had a sore throat chest congestion and cough since Saturday. He is not running fever and states his cough is productive with a light green sputum. He is taking over the counter medicine for it and it is not helping. He is asking if we will order antibiotics for him. Please advise

## 2023-10-21 ENCOUNTER — Telehealth: Payer: Self-pay | Admitting: *Deleted

## 2023-10-21 LAB — MULTIPLE MYELOMA PANEL, SERUM
Albumin SerPl Elph-Mcnc: 4.1 g/dL (ref 2.9–4.4)
Albumin/Glob SerPl: 1.8 — ABNORMAL HIGH (ref 0.7–1.7)
Alpha 1: 0.2 g/dL (ref 0.0–0.4)
Alpha2 Glob SerPl Elph-Mcnc: 0.7 g/dL (ref 0.4–1.0)
B-Globulin SerPl Elph-Mcnc: 1 g/dL (ref 0.7–1.3)
Gamma Glob SerPl Elph-Mcnc: 0.4 g/dL (ref 0.4–1.8)
Globulin, Total: 2.3 g/dL (ref 2.2–3.9)
IgA: 68 mg/dL (ref 61–437)
IgG (Immunoglobin G), Serum: 492 mg/dL — ABNORMAL LOW (ref 603–1613)
IgM (Immunoglobulin M), Srm: 14 mg/dL — ABNORMAL LOW (ref 20–172)
Total Protein ELP: 6.4 g/dL (ref 6.0–8.5)

## 2023-10-21 NOTE — Telephone Encounter (Signed)
 Spoke with patient and informed him that he tested positive for RSV and to continue taking antibiotics as ordered.  Pt verbalized understanding.

## 2023-10-23 ENCOUNTER — Observation Stay
Admission: EM | Admit: 2023-10-23 | Discharge: 2023-10-24 | Disposition: A | Discharge status: Home / Self Care | Payer: Medicare Other | Attending: Internal Medicine | Admitting: Internal Medicine

## 2023-10-23 ENCOUNTER — Other Ambulatory Visit: Payer: Self-pay

## 2023-10-23 ENCOUNTER — Telehealth: Payer: Self-pay | Admitting: *Deleted

## 2023-10-23 ENCOUNTER — Emergency Department: Payer: Medicare Other

## 2023-10-23 DIAGNOSIS — B338 Other specified viral diseases: Principal | ICD-10-CM | POA: Diagnosis present

## 2023-10-23 DIAGNOSIS — Z79899 Other long term (current) drug therapy: Secondary | ICD-10-CM | POA: Insufficient documentation

## 2023-10-23 DIAGNOSIS — R0602 Shortness of breath: Secondary | ICD-10-CM | POA: Diagnosis present

## 2023-10-23 DIAGNOSIS — Z9481 Bone marrow transplant status: Secondary | ICD-10-CM | POA: Insufficient documentation

## 2023-10-23 DIAGNOSIS — Z7951 Long term (current) use of inhaled steroids: Secondary | ICD-10-CM | POA: Diagnosis not present

## 2023-10-23 DIAGNOSIS — E785 Hyperlipidemia, unspecified: Secondary | ICD-10-CM | POA: Diagnosis present

## 2023-10-23 DIAGNOSIS — D696 Thrombocytopenia, unspecified: Secondary | ICD-10-CM | POA: Diagnosis not present

## 2023-10-23 DIAGNOSIS — I10 Essential (primary) hypertension: Secondary | ICD-10-CM | POA: Diagnosis present

## 2023-10-23 DIAGNOSIS — E669 Obesity, unspecified: Secondary | ICD-10-CM | POA: Diagnosis not present

## 2023-10-23 DIAGNOSIS — N1831 Chronic kidney disease, stage 3a: Secondary | ICD-10-CM | POA: Diagnosis not present

## 2023-10-23 DIAGNOSIS — I129 Hypertensive chronic kidney disease with stage 1 through stage 4 chronic kidney disease, or unspecified chronic kidney disease: Secondary | ICD-10-CM | POA: Diagnosis not present

## 2023-10-23 DIAGNOSIS — Z7982 Long term (current) use of aspirin: Secondary | ICD-10-CM | POA: Diagnosis not present

## 2023-10-23 DIAGNOSIS — D631 Anemia in chronic kidney disease: Secondary | ICD-10-CM | POA: Diagnosis not present

## 2023-10-23 DIAGNOSIS — Z6839 Body mass index (BMI) 39.0-39.9, adult: Secondary | ICD-10-CM | POA: Diagnosis not present

## 2023-10-23 DIAGNOSIS — C9000 Multiple myeloma not having achieved remission: Secondary | ICD-10-CM | POA: Diagnosis present

## 2023-10-23 DIAGNOSIS — R197 Diarrhea, unspecified: Secondary | ICD-10-CM | POA: Diagnosis present

## 2023-10-23 LAB — COMPREHENSIVE METABOLIC PANEL
ALT: 21 U/L (ref 0–44)
AST: 25 U/L (ref 15–41)
Albumin: 4 g/dL (ref 3.5–5.0)
Alkaline Phosphatase: 51 U/L (ref 38–126)
Anion gap: 11 (ref 5–15)
BUN: 21 mg/dL (ref 8–23)
CO2: 24 mmol/L (ref 22–32)
Calcium: 9.2 mg/dL (ref 8.9–10.3)
Chloride: 102 mmol/L (ref 98–111)
Creatinine, Ser: 1.54 mg/dL — ABNORMAL HIGH (ref 0.61–1.24)
GFR, Estimated: 49 mL/min — ABNORMAL LOW (ref >60)
Glucose, Bld: 121 mg/dL — ABNORMAL HIGH (ref 70–99)
Potassium: 4.6 mmol/L (ref 3.5–5.1)
Sodium: 137 mmol/L (ref 135–145)
Total Bilirubin: 0.5 mg/dL (ref 0.0–1.2)
Total Protein: 7 g/dL (ref 6.5–8.1)

## 2023-10-23 LAB — CBC WITH DIFFERENTIAL/PLATELET
Abs Immature Granulocytes: 0.02 10*3/uL (ref 0.00–0.07)
Basophils Absolute: 0 10*3/uL (ref 0.0–0.1)
Basophils Relative: 0 %
Eosinophils Absolute: 0 10*3/uL (ref 0.0–0.5)
Eosinophils Relative: 1 %
HCT: 39.3 % (ref 39.0–52.0)
Hemoglobin: 13.6 g/dL (ref 13.0–17.0)
Immature Granulocytes: 0 %
Lymphocytes Relative: 17 %
Lymphs Abs: 1 10*3/uL (ref 0.7–4.0)
MCH: 34.8 pg — ABNORMAL HIGH (ref 26.0–34.0)
MCHC: 34.6 g/dL (ref 30.0–36.0)
MCV: 100.5 fL — ABNORMAL HIGH (ref 80.0–100.0)
Monocytes Absolute: 0.5 10*3/uL (ref 0.1–1.0)
Monocytes Relative: 8 %
Neutro Abs: 4.5 10*3/uL (ref 1.7–7.7)
Neutrophils Relative %: 74 %
Platelets: 91 10*3/uL — ABNORMAL LOW (ref 150–400)
RBC: 3.91 MIL/uL — ABNORMAL LOW (ref 4.22–5.81)
RDW: 12.9 % (ref 11.5–15.5)
Smear Review: NORMAL
WBC: 6 10*3/uL (ref 4.0–10.5)
nRBC: 0 % (ref 0.0–0.2)

## 2023-10-23 LAB — LACTIC ACID, PLASMA
Lactic Acid, Venous: 1.6 mmol/L (ref 0.5–1.9)
Lactic Acid, Venous: 0.9 mmol/L (ref 0.5–1.9)

## 2023-10-23 LAB — PROCALCITONIN: Procalcitonin: 0.18 ng/mL

## 2023-10-23 LAB — HIV ANTIBODY (ROUTINE TESTING W REFLEX): HIV Screen 4th Generation wRfx: NONREACTIVE

## 2023-10-23 MED ORDER — ONDANSETRON HCL 4 MG/2ML IJ SOLN: 4.0000 mg | Freq: Three times a day (TID) | INTRAMUSCULAR | Status: DC | PRN

## 2023-10-23 MED ORDER — SODIUM CHLORIDE 0.9 % IV BOLUS
1000.0000 mL | Freq: Once | INTRAVENOUS | Status: AC
  Administered 2023-10-23: 1000 mL via INTRAVENOUS

## 2023-10-23 MED ORDER — ACETAMINOPHEN 325 MG PO TABS
650.0000 mg | ORAL_TABLET | Freq: Four times a day (QID) | ORAL | Status: DC | PRN
  Administered 2023-10-24: 650 mg via ORAL
  Filled 2023-10-23: qty 2

## 2023-10-23 MED ORDER — ACETAMINOPHEN 500 MG PO TABS
1000.0000 mg | ORAL_TABLET | Freq: Once | ORAL | Status: AC
  Administered 2023-10-23: 1000 mg via ORAL
  Filled 2023-10-23: qty 2

## 2023-10-23 MED ORDER — IPRATROPIUM-ALBUTEROL 0.5-2.5 (3) MG/3ML IN SOLN
3.0000 mL | Freq: Once | RESPIRATORY_TRACT | Status: AC
  Administered 2023-10-23: 3 mL via RESPIRATORY_TRACT
  Filled 2023-10-23: qty 3

## 2023-10-23 MED ORDER — ALBUTEROL SULFATE (2.5 MG/3ML) 0.083% IN NEBU: 2.5000 mg | INHALATION_SOLUTION | RESPIRATORY_TRACT | Status: DC | PRN

## 2023-10-23 MED ORDER — DM-GUAIFENESIN ER 30-600 MG PO TB12
1.0000 | ORAL_TABLET | Freq: Two times a day (BID) | ORAL | Status: DC | PRN
  Administered 2023-10-24: 1 via ORAL
  Filled 2023-10-23: qty 1

## 2023-10-23 MED ORDER — HYDRALAZINE HCL 20 MG/ML IJ SOLN: 5.0000 mg | INTRAMUSCULAR | Status: DC | PRN

## 2023-10-23 MED ORDER — SODIUM CHLORIDE 0.9 % IV SOLN
500.0000 mg | Freq: Once | INTRAVENOUS | Status: AC
  Administered 2023-10-23: 500 mg via INTRAVENOUS
  Filled 2023-10-23: qty 5

## 2023-10-23 NOTE — ED Provider Notes (Addendum)
 [facility] Emergency Department Provider Note     Event Date/Time   First MD Initiated Contact with Patient 10/23/23 1542     (approximate)   History   Weakness and Shortness of Breath   HPI  Luis Burke is a 68 y.o. male with a history of multiple myeloma currently on chemotherapy, metastatic bone disease, stage III CKD, HTN, and HLD who presents to the ED noting ongoing weakness and shortness of breath.  Patient was recently diagnosed with RSV, and was advised to report to the ED for worsening symptoms given his medical history.  Patient was apparently started on a course of Augmentin  from the cancer center.  He would endorse decreased appetite associated with the symptoms.  No fevers, chills, or chest pain reported at this time.  Physical Exam   Triage Vital Signs: ED Triage Vitals  Encounter Vitals Group     BP 10/23/23 1327 (!) 175/146     Systolic BP Percentile --      Diastolic BP Percentile --      Pulse Rate 10/23/23 1327 (!) 108     Resp 10/23/23 1327 18     Temp 10/23/23 1327 98.6 F (37 C)     Temp Source 10/23/23 1327 Oral     SpO2 10/23/23 1327 94 %     Weight 10/23/23 1329 205 lb (93 kg)     Height 10/23/23 1329 5' 8 (1.727 m)     Head Circumference --      Peak Flow --      Pain Score 10/23/23 1329 4     Pain Loc --      Pain Education --      Exclude from Growth Chart --     Most recent vital signs: Vitals:   10/23/23 1912 10/23/23 2206  BP:  126/84  Pulse:  87  Resp:    Temp: 99.5 F (37.5 C) 98.7 F (37.1 C)  SpO2:  90%    General Awake, no distress. NAD HEENT NCAT. PERRL. EOMI. No rhinorrhea. Mucous membranes are moist.  CV:  Good peripheral perfusion. RRR RESP:  Normal effort. CTA. No wheeze, rales, or rhonchi ABD:  No distention.    ED Results / Procedures / Treatments   Labs (all labs ordered are listed, but only abnormal results are displayed) Labs Reviewed  COMPREHENSIVE METABOLIC PANEL -  Abnormal; Notable for the following components:      Result Value   Glucose, Bld 121 (*)    Creatinine, Ser 1.54 (*)    GFR, Estimated 49 (*)    All other components within normal limits  CBC WITH DIFFERENTIAL/PLATELET - Abnormal; Notable for the following components:   RBC 3.91 (*)    MCV 100.5 (*)    MCH 34.8 (*)    Platelets 91 (*)    All other components within normal limits  CULTURE, BLOOD (ROUTINE X 2)  CULTURE, BLOOD (ROUTINE X 2)  LACTIC ACID, PLASMA  LACTIC ACID, PLASMA  PROCALCITONIN  HIV ANTIBODY (ROUTINE TESTING W REFLEX)  BASIC METABOLIC PANEL  CBC    EKG   RADIOLOGY  I personally viewed and evaluated these images as part of my medical decision making, as well as reviewing the written report by the radiologist.  ED Provider Interpretation: no acute findings  DG Chest 2 View Result Date: 10/23/2023 CLINICAL DATA:  Cough.  No appetite.  Shortness of breath. EXAM: CHEST - 2 VIEW COMPARISON:  10/20/2023. FINDINGS: Low  lung volume. Bilateral lung fields are clear. Bilateral costophrenic angles are clear. Stable cardio-mediastinal silhouette. No acute osseous abnormalities. The soft tissues are within normal limits. IMPRESSION: No active cardiopulmonary disease. Electronically Signed   By: Ree Molt M.D.   On: 10/23/2023 14:57     PROCEDURES:  Critical Care performed: No  Procedures   MEDICATIONS ORDERED IN ED: Medications  albuterol  (PROVENTIL ) (2.5 MG/3ML) 0.083% nebulizer solution 2.5 mg (has no administration in time range)  dextromethorphan -guaiFENesin  (MUCINEX  DM) 30-600 MG per 12 hr tablet 1 tablet (has no administration in time range)  ondansetron  (ZOFRAN ) injection 4 mg (has no administration in time range)  hydrALAZINE  (APRESOLINE ) injection 5 mg (has no administration in time range)  acetaminophen  (TYLENOL ) tablet 650 mg (has no administration in time range)  sodium chloride  0.9 % bolus 1,000 mL (0 mLs Intravenous Stopped 10/23/23 1849)   ipratropium-albuterol  (DUONEB) 0.5-2.5 (3) MG/3ML nebulizer solution 3 mL (3 mLs Nebulization Given 10/23/23 1738)  acetaminophen  (TYLENOL ) tablet 1,000 mg (1,000 mg Oral Given 10/23/23 1737)  azithromycin  (ZITHROMAX ) 500 mg in sodium chloride  0.9 % 250 mL IVPB (0 mg Intravenous Stopped 10/23/23 2043)  sodium chloride  0.9 % bolus 1,000 mL (0 mLs Intravenous Stopped 10/23/23 2117)     IMPRESSION / MDM / ASSESSMENT AND PLAN / ED COURSE  I reviewed the triage vital signs and the nursing notes.                              Differential diagnosis includes, but is not limited to, viral syndrome, bronchitis including COPD exacerbation, pneumonia, reactive airway disease including asthma, CHF including exacerbation with or without pulmonary/interstitial edema, pneumothorax, ACS, thoracic trauma, and pulmonary embolism.   Patient's presentation is most consistent with acute complicated illness / injury requiring diagnostic workup.  Patient's diagnosis is consistent with probable lower respiratory tract infection including community-acquired pneumonia.  Patient with a overall reassuring exam and workup initially.  Patient with labs without any signs of acute leukocytosis or critical anemia.patient did spike a Tmax of 103 F during his course in the ED.  Patient noted improvement of his respiratory symptoms following a DuoNeb treatment.  Chest x-ray without evidence of acute intrathoracic process, based on my interpretation.  Given the patient respiratory symptoms and sudden fever, symptoms likely represent a subclinical CAP.  I discussed admission with the patient and his family at bedside, and given his moon status, the patient was agreeable to admission for ongoing evaluation and management of his CAP.  Patient will be mated to the hospital service as discussed.   FINAL CLINICAL IMPRESSION(S) / ED DIAGNOSES   Final diagnoses:  RSV infection     Rx / DC Orders   ED Discharge Orders     None         Note:  This document was prepared using Dragon voice recognition software and may include unintentional dictation errors.    Loyd Candida LULLA Aldona, PA-C 10/23/23 1954    Loyd Candida LULLA Aldona, PA-C 10/23/23 2255    Willo Dunnings, MD 10/23/23 2259

## 2023-10-23 NOTE — ED Notes (Signed)
 Report given to Reonna. Care relinquished at this time.

## 2023-10-23 NOTE — Telephone Encounter (Signed)
 Patient called reporting that the only thig antibiotics has done for him is to help his sore throat. He is lethargic per wife, has 0 energy, no appetite, now has diarrhea, coughing and had significant shortness of breath last night. Please advise

## 2023-10-23 NOTE — ED Notes (Signed)
 Pt moved to C pod to await admission bed

## 2023-10-23 NOTE — ED Provider Triage Note (Signed)
 Emergency Medicine Provider Triage Evaluation Note  Luis Burke , a 68 y.o. male  was evaluated in triage.  Pt complains of weakness, no appetite, SOB. Symptoms started on Monday, had URI work up which showed RSV. Was advised to go to the ED if he wasn't feeling any better.   Pt has history of multiple myeloma, followed by Dr. Babara. In chemo treatment currently.   Review of Systems  Positive: Weakness, sob, no appetite Negative:   Physical Exam  There were no vitals taken for this visit. Gen:   Awake, no distress   Resp:  Normal effort  MSK:   Moves extremities without difficulty  Other:    Medical Decision Making  Medically screening exam initiated at 1:24 PM.  Appropriate orders placed.  Luis Burke was informed that the remainder of the evaluation will be completed by another provider, this initial triage assessment does not replace that evaluation, and the importance of remaining in the ED until their evaluation is complete.     Luis Tinnie LABOR, PA-C 10/23/23 1330

## 2023-10-23 NOTE — H&P (Signed)
 History and Physical    MIKYLE Burke FMW:[id number] DOB: May 17, 1956 DOA: 10/23/2023  Referring MD/NP/PA:   PCP: Cleatus Arlyss RAMAN, MD   Patient coming from:  The patient is coming from home.     Chief Complaint: Cough and shortness of breath  HPI: Luis Burke is a 68 y.o. male with medical history significant of multiple myeloma on chemotherapy (s/p of bone marrow transplantation 2020), hypertension, hyperlipidemia, CKD-3A, obesity, thrombocytopenia, carotid artery stenosis, who presents with cough and shortness of breath.  Patient stated he has dry cough and shortness of breath for 4 days, which has been progressively worsening.  Patient has generalized weakness, mild pleuritic front chest pain.  He also has fever and chills.  Patient has diarrhea, no nausea, vomiting or abdominal pain.  He has 2 or 3 times of watery diarrhea today.  No symptoms of UTI.  Patient was diagnosed with RSV infection on 10/19/2022 and started Augmentin  by PCP without improvement.  Data reviewed independently and ED Course: pt was found to have WBC 6.0, lactic acid 1.6, 0.9, procalcitonin 0.18, renal function slightly worsening than baseline.  Temperature 103, blood pressure 175/146, 102/63, heart rate 108, 95, RR 18, oxygen saturation 93-96% on room air.  Chest x-ray negative for infiltration.  Patient is placed on MedSurg bed for observation.   EKG:  Not done in ED, will get one.     Review of Systems:   General: has fevers, chills, no body weight gain, has poor appetite, has fatigue HEENT: no blurry vision, hearing changes or sore throat Respiratory: has dyspnea, coughing, no wheezing CV: has chest pain, no palpitations GI: no nausea, vomiting, abdominal pain, has diarrhea GU: no dysuria, burning on urination, increased urinary frequency, hematuria  Ext: no leg edema Neuro: no unilateral weakness, numbness, or tingling, no vision change or hearing loss Skin: no rash, no skin tear. MSK: No muscle  spasm, no deformity, no limitation of range of movement in spin Heme: No easy bruising.  Travel history: No recent long distant travel.   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   Fatty liver    on u/s 07/2018   Foot fracture    Hyperlipidemia 03/2004   Hypertension 1994   Multiple myeloma (HCC)    Snores     Past Surgical History:  Procedure Laterality Date   CATARACT EXTRACTION Left 10/10/2021   CATARACT EXTRACTION W/PHACO Right 03/04/2018   Procedure: CATARACT EXTRACTION PHACO AND INTRAOCULAR LENS PLACEMENT (IOC)  RIGHT TORIC;  Surgeon: Mittie Gaskin, MD;  Location: [facility];  Service: Ophthalmology;  Laterality: Right;  Per [place] no Toric Lens 1:45 5.3   CATARACT EXTRACTION W/PHACO Left 10/10/2021   Procedure: CATARACT EXTRACTION PHACO AND INTRAOCULAR LENS PLACEMENT (IOC) LEFT;  Surgeon: Mittie Gaskin, MD;  Location: [facility];  Service: Ophthalmology;  Laterality: Left;  5.16 1:03.8   COLONOSCOPY WITH PROPOFOL  N/A 07/20/2018   Procedure: COLONOSCOPY WITH PROPOFOL ;  Surgeon: Therisa Bi, MD;  Location: [facility];  Service: Gastroenterology;  Laterality: N/A;   ESOPHAGOGASTRODUODENOSCOPY (EGD) WITH PROPOFOL  N/A 07/20/2018  Procedure: ESOPHAGOGASTRODUODENOSCOPY (EGD) WITH PROPOFOL ;  Surgeon: Therisa Bi, MD;  Location: [facility];  Service: Gastroenterology;  Laterality: N/A;   EYE SURGERY Right    HERNIA REPAIR     IR BONE MARROW BIOPSY & ASPIRATION  05/08/2023   L Lap herniorraphy  04/2000   Left wrist ganglionectomy      Social History:  reports that he has never  smoked. He has never used smokeless tobacco. He reports that he does not currently use alcohol after a past usage of about 3.0 standard drinks of alcohol per week. He reports that he does not use drugs.  Family History:  Family History  Problem Relation Age of Onset   Hypertension Mother    Diabetes Mother    Heart disease Mother        CAD   Dementia Mother    Prostate cancer Father    Hypertension Father    Heart disease Father        MI 02/03   Colon cancer Father    Hypertension Sister    Hypertension Sister    Hypertension Sister      Prior to Admission medications   Medication Sig Start Date End Date Taking? Authorizing Provider  acetaminophen  (TYLENOL ) 325 MG tablet Take 2 tablets (650 mg total) by mouth See admin instructions. 1 hour prior to chemotherapy treatments 06/07/21   Babara Call, MD  acyclovir  (ZOVIRAX ) 400 MG tablet TAKE 1 TABLET(400 MG) BY MOUTH TWICE DAILY 08/26/23   Babara Call, MD  amLODipine  (NORVASC ) 5 MG tablet Take 2 tablets (10 mg total) by mouth daily. 07/31/23   Cleatus Arlyss RAMAN, MD  amoxicillin -clavulanate (AUGMENTIN ) 875-125 MG tablet Take 1 tablet by mouth 2 (two) times daily. 10/20/23   Borders, Fonda SAUNDERS, NP  ASPIRIN  81 PO Take 81 mg by mouth daily.    [provider]  B Complex Vitamins (VITAMIN B COMPLEX PO) Take 1 Dose by mouth every other day. 11/17/19   [provider]  co-enzyme Q-10 50 MG capsule Take 50 mg by mouth daily.    [provider]  [facility] VITAMIN B12 1000 MCG tablet TAKE 1 TABLET BY MOUTH EVERY DAY 01/09/21   Cleatus Arlyss RAMAN, MD  dexamethasone  (DECADRON ) 4 MG tablet Take 5 tablets (20 mg total) by mouth as directed. Take one hour before monthly Darzalex  injection. 10/30/22   Babara Call, MD  diphenhydrAMINE  (BENADRYL ) 50 MG tablet Take 1 tablet (50 mg total) by mouth See admin instructions. Take 1 tablet 1 hour prior to chemotherapy treatments. 06/12/23   Cleatus Arlyss RAMAN, MD  hydrALAZINE  (APRESOLINE ) 10 MG tablet TAKE 1  TABLET(10 MG) BY MOUTH THREE TIMES DAILY 07/31/23   Cleatus Arlyss RAMAN, MD  lenalidomide  (REVLIMID ) 5 MG capsule Take 1 capsule (5 mg total) by mouth daily. Take for 21 days, then hold for 7 days. Repeat every 28 days. Patient not taking: Reported on 10/20/2023 07/03/23   Babara Call, MD  lisinopril  (ZESTRIL ) 20 MG tablet Take 1 tablet (20 mg total) by mouth daily. 07/31/23   Cleatus Arlyss RAMAN, MD  magnesium  chloride (SLOW-MAG) 64 MG TBEC SR tablet Take 1 tablet (64 mg total) by mouth daily. 06/22/19   Babara Call, MD  metoprolol  succinate (TOPROL -XL) 50 MG 24 hr tablet TAKE 2 TABLETS BY MOUTH TWICE A DAY WITH OR IMMEDIATELY FOLLOWING A MEAL 08/07/23   Cleatus Arlyss RAMAN, MD  ondansetron  (ZOFRAN ) 8 MG tablet Take 1 tablet (8 mg total) by mouth every 8 (eight) hours as  needed for nausea or vomiting. 07/08/23   Babara Call, MD  pravastatin  (PRAVACHOL ) 10 MG tablet Take 1 tablet (10 mg total) by mouth daily. 07/31/23   Cleatus Arlyss RAMAN, MD  prochlorperazine  (COMPAZINE ) 10 MG tablet Take 1 tablet (10 mg total) by mouth every 6 (six) hours as needed for nausea or vomiting. 07/08/23   Babara Call, MD  sildenafil  (REVATIO ) 20 MG tablet Take 1-5 tablets (20-100 mg total) by mouth daily as needed. 07/31/23   Cleatus Arlyss RAMAN, MD  zinc gluconate 50 MG tablet Take 50 mg by mouth daily.    [provider]    Physical Exam: Vitals:   10/23/23 1849 10/23/23 1912 10/23/23 2206 10/23/23 2315  BP: 102/63  126/84 138/79  Pulse: 95  87 87  Resp: 18   (!) 22  Temp:  99.5 F (37.5 C) 98.7 F (37.1 C)   TempSrc:  Oral Oral   SpO2: 93%  90% 93%  Weight:      Height:       General: Not in acute distress HEENT:       Eyes: PERRL, EOMI, no jaundice       ENT: No discharge from the ears and nose, no pharynx injection, no tonsillar enlargement.        Neck: No JVD, no bruit, no mass felt. Heme: No neck lymph node enlargement. Cardiac: S1/S2, RRR, No murmurs, No gallops or rubs. Respiratory: No rales, wheezing, rhonchi or  rubs. GI: Soft, nondistended, nontender, no rebound pain, no organomegaly, BS present. GU: No hematuria Ext: No pitting leg edema bilaterally. 1+DP/PT pulse bilaterally. Musculoskeletal: No joint deformities, No joint redness or warmth, no limitation of ROM in spin. Skin: No rashes.  Neuro: Alert, oriented X3, cranial nerves II-XII grossly intact, moves all extremities normally. Psych: Patient is not psychotic, no suicidal or hemocidal ideation.  Labs on Admission: I have personally reviewed following labs and imaging studies  CBC: Recent Labs  Lab 10/20/23 1121 10/23/23 1402  WBC 9.5 6.0  NEUTROABS 6.9 4.5  HGB 14.7 13.6  HCT 43.2 39.3  MCV 100.7* 100.5*  PLT 78* 91*   Basic Metabolic Panel: Recent Labs  Lab 10/20/23 1121 10/23/23 1402  NA 138 137  K 4.9 4.6  CL 102 102  CO2 27 24  GLUCOSE 121* 121*  BUN 20 21  CREATININE 1.42* 1.54*  CALCIUM 9.8 9.2   GFR: Estimated Creatinine Clearance: 51.5 mL/min (A) (by C-G formula based on SCr of 1.54 mg/dL (H)). Liver Function Tests: Recent Labs  Lab 10/20/23 1121 10/23/23 1402  AST 22 25  ALT 21 21  ALKPHOS 62 51  BILITOT 0.9 0.5  PROT 7.2 7.0  ALBUMIN 4.5 4.0   No results for input(s): LIPASE, AMYLASE in the last 168 hours. No results for input(s): AMMONIA in the last 168 hours. Coagulation Profile: No results for input(s): INR, PROTIME in the last 168 hours. Cardiac Enzymes: No results for input(s): CKTOTAL, CKMB, CKMBINDEX, TROPONINI in the last 168 hours. BNP (last 3 results) No results for input(s): PROBNP in the last 8760 hours. HbA1C: No results for input(s): HGBA1C in the last 72 hours. CBG: No results for input(s): GLUCAP in the last 168 hours. Lipid Profile: No results for input(s): CHOL, HDL, LDLCALC, TRIG, CHOLHDL, LDLDIRECT in the last 72 hours. Thyroid  Function Tests: No results for input(s): TSH, T4TOTAL, FREET4, T3FREE, THYROIDAB in the last 72  hours. Anemia Panel: No results for input(s): VITAMINB12, FOLATE, FERRITIN, TIBC, IRON, RETICCTPCT in the  last 72 hours. Urine analysis:    Component Value Date/Time   COLORURINE STRAW (A) 01/06/2023 1055   APPEARANCEUR CLEAR (A) 01/06/2023 1055   APPEARANCEUR Clear 07/01/2018 0945   LABSPEC 1.005 01/06/2023 1055   PHURINE 5.0 01/06/2023 1055   GLUCOSEU NEGATIVE 01/06/2023 1055   HGBUR NEGATIVE 01/06/2023 1055   BILIRUBINUR NEGATIVE 01/06/2023 1055   BILIRUBINUR Negative 07/01/2018 0945   KETONESUR NEGATIVE 01/06/2023 1055   PROTEINUR NEGATIVE 01/06/2023 1055   NITRITE NEGATIVE 01/06/2023 1055   LEUKOCYTESUR NEGATIVE 01/06/2023 1055   Sepsis Labs: @LABRCNTIP (procalcitonin:4,lacticidven:4) ) Recent Results (from the past 240 hours)  Respiratory (~20 pathogens) panel by PCR     Status: Abnormal   Collection Time: 10/20/23 11:28 AM   Specimen: Nasopharyngeal Swab; Respiratory  Result Value Ref Range Status   Adenovirus NOT DETECTED NOT DETECTED Final   Coronavirus 229E NOT DETECTED NOT DETECTED Final    Comment: (NOTE) The Coronavirus on the Respiratory Panel, DOES NOT test for the novel  Coronavirus (2019 nCoV)    Coronavirus HKU1 NOT DETECTED NOT DETECTED Final   Coronavirus NL63 NOT DETECTED NOT DETECTED Final   Coronavirus OC43 NOT DETECTED NOT DETECTED Final   Metapneumovirus NOT DETECTED NOT DETECTED Final   Rhinovirus / Enterovirus NOT DETECTED NOT DETECTED Final   Influenza A NOT DETECTED NOT DETECTED Final   Influenza B NOT DETECTED NOT DETECTED Final   Parainfluenza Virus 1 NOT DETECTED NOT DETECTED Final   Parainfluenza Virus 2 NOT DETECTED NOT DETECTED Final   Parainfluenza Virus 3 NOT DETECTED NOT DETECTED Final   Parainfluenza Virus 4 NOT DETECTED NOT DETECTED Final   Respiratory Syncytial Virus DETECTED (A) NOT DETECTED Final   Bordetella pertussis NOT DETECTED NOT DETECTED Final   Bordetella Parapertussis NOT DETECTED NOT DETECTED Final    Chlamydophila pneumoniae NOT DETECTED NOT DETECTED Final   Mycoplasma pneumoniae NOT DETECTED NOT DETECTED Final    Comment: Performed at [facility], [address], [place], [place] [address]     Radiological Exams on Admission:   Assessment/Plan Principal Problem:   RSV infection Active Problems:   Multiple myeloma (HCC)   Thrombocytopenia (HCC)   HLD (hyperlipidemia)   Essential hypertension, benign   Stage 3a chronic kidney disease (HCC)   Obesity (BMI 30-39.9)   Diarrhea   Assessment and Plan:   RSV infection: Patient has fever and chills, but no leukocytosis.  Chest x-ray negative for infiltration.  No oxygen desaturation.  Lactic acid is normal x 2.  Procalcitonin 0.18.  Patient is on Augmentin , received 1 dose of azithromycin , will discontinue antibiotics.  -Placed on MedSurg bed for patient -As needed albuterol , Mucinex  -As needed Tylenol  -IV fluid, 2 L normal saline  Multiple myeloma (HCC): S/p of bone marrow transplantation on 04/2019.  Patient is being evaluated in [facility] for possible CAR-T cell treatment currently.  She is on chemotherapy, last dose was 2 weeks ago. -Full with Dr. Babara of oncology  Thrombocytopenia [facility]): This is chronic issue.  Platelet 91, no active bleeding -Follow-up by CBC  HLD (hyperlipidemia) -Pravastatin   Essential hypertension, benign -IV hydralazine  as needed. -Metoprolol  -Hold lisinopril , amlodipine  and hydralazine  since patient at risk of developing hypotension.   Stage 3a chronic kidney disease (HCC): Renal function close to baseline, may be slightly worsening.  Baseline creatinine 1.2-1.4 recently.  His creatinine is 1.54, BUN 21, GFR 49 -IV fluid as above -Hold lisinopril  and HCTZ  Obesity (BMI 30-39.9): Body weight 93 kg, BMI 31.17 -Encourage losing weight -Exercise  and healthy diet  Diarrhea: May be due to Augmentin  use -Check C. difficile        DVT ppx: SCD  Code Status: Full code     Family  Communication: Yes, patient's wife    at bed side.      Disposition Plan:  Anticipate discharge back to previous environment  Consults called:  none  Admission status and Level of care: Med-Surg:    for obs as inpt        Dispo: The patient is from: Home              Anticipated d/c is to: Home              Anticipated d/c date is: 1 day              Patient currently is not medically stable to d/c.    Severity of Illness:  The appropriate patient status for this patient is OBSERVATION. Observation status is judged to be reasonable and necessary in order to provide the required intensity of service to ensure the patient's safety. The patient's presenting symptoms, physical exam findings, and initial radiographic and laboratory data in the context of their medical condition is felt to place them at decreased risk for further clinical deterioration. Furthermore, it is anticipated that the patient will be medically stable for discharge from the hospital within 2 midnights of admission.        Date of Service 10/24/2023    Caleb Exon [facility]   If 7PM-7AM, please contact night-coverage [email or web address] 10/24/2023, 1:52 AM

## 2023-10-23 NOTE — Telephone Encounter (Signed)
 Called and spoke to pt. Informed him that Dr. Cathie Hoops recommends him to got ER. Pt verbalized understanding.

## 2023-10-23 NOTE — ED Triage Notes (Signed)
 Pt states weakness and SOB started Monday. Pt states he is currently receiving chemo. Pt states was Dx'ed with RSV. NAD noted.

## 2023-10-24 DIAGNOSIS — B338 Other specified viral diseases: Secondary | ICD-10-CM | POA: Diagnosis not present

## 2023-10-24 DIAGNOSIS — R197 Diarrhea, unspecified: Secondary | ICD-10-CM | POA: Diagnosis present

## 2023-10-24 DIAGNOSIS — D696 Thrombocytopenia, unspecified: Secondary | ICD-10-CM | POA: Diagnosis not present

## 2023-10-24 DIAGNOSIS — I10 Essential (primary) hypertension: Secondary | ICD-10-CM | POA: Diagnosis not present

## 2023-10-24 DIAGNOSIS — K591 Functional diarrhea: Secondary | ICD-10-CM

## 2023-10-24 DIAGNOSIS — C9000 Multiple myeloma not having achieved remission: Secondary | ICD-10-CM | POA: Diagnosis not present

## 2023-10-24 LAB — CBC
HCT: 32.9 % — ABNORMAL LOW (ref 39.0–52.0)
Hemoglobin: 11.3 g/dL — ABNORMAL LOW (ref 13.0–17.0)
MCH: 34.2 pg — ABNORMAL HIGH (ref 26.0–34.0)
MCHC: 34.3 g/dL (ref 30.0–36.0)
MCV: 99.7 fL (ref 80.0–100.0)
Platelets: 73 10*3/uL — ABNORMAL LOW (ref 150–400)
RBC: 3.3 MIL/uL — ABNORMAL LOW (ref 4.22–5.81)
RDW: 12.6 % (ref 11.5–15.5)
WBC: 2.8 10*3/uL — ABNORMAL LOW (ref 4.0–10.5)
nRBC: 0 % (ref 0.0–0.2)

## 2023-10-24 LAB — BASIC METABOLIC PANEL
Anion gap: 6 (ref 5–15)
BUN: 19 mg/dL (ref 8–23)
CO2: 24 mmol/L (ref 22–32)
Calcium: 7.8 mg/dL — ABNORMAL LOW (ref 8.9–10.3)
Chloride: 105 mmol/L (ref 98–111)
Creatinine, Ser: 1.29 mg/dL — ABNORMAL HIGH (ref 0.61–1.24)
GFR, Estimated: >60 mL/min (ref >60)
Glucose, Bld: 103 mg/dL — ABNORMAL HIGH (ref 70–99)
Potassium: 4.2 mmol/L (ref 3.5–5.1)
Sodium: 135 mmol/L (ref 135–145)

## 2023-10-24 LAB — C DIFFICILE QUICK SCREEN W PCR REFLEX
C Diff antigen: NEGATIVE
C Diff interpretation: NOT DETECTED
C Diff toxin: NEGATIVE

## 2023-10-24 MED ORDER — METOPROLOL SUCCINATE ER 50 MG PO TB24
100.0000 mg | ORAL_TABLET | Freq: Two times a day (BID) | ORAL | Status: DC
  Administered 2023-10-24: 100 mg via ORAL
  Filled 2023-10-24: qty 2

## 2023-10-24 MED ORDER — DM-GUAIFENESIN ER 30-600 MG PO TB12: 1.0000 | ORAL_TABLET | Freq: Two times a day (BID) | ORAL | 0 refills | Status: AC | PRN

## 2023-10-24 MED ORDER — ALBUTEROL SULFATE HFA 108 (90 BASE) MCG/ACT IN AERS
2.0000 | INHALATION_SPRAY | Freq: Four times a day (QID) | RESPIRATORY_TRACT | 2 refills | Status: DC | PRN
Start: 2023-10-24 — End: 2024-03-04

## 2023-10-24 MED ORDER — ASPIRIN 81 MG PO TBEC
81.0000 mg | DELAYED_RELEASE_TABLET | Freq: Every day | ORAL | Status: DC
  Administered 2023-10-24: 81 mg via ORAL
  Filled 2023-10-24: qty 1

## 2023-10-24 MED ORDER — B COMPLEX-C PO TABS
1.0000 | ORAL_TABLET | ORAL | Status: DC
  Administered 2023-10-24: 1 via ORAL
  Filled 2023-10-24: qty 1

## 2023-10-24 MED ORDER — ACYCLOVIR 200 MG PO CAPS
400.0000 mg | ORAL_CAPSULE | Freq: Two times a day (BID) | ORAL | Status: DC
  Administered 2023-10-24: 400 mg via ORAL
  Filled 2023-10-24: qty 2

## 2023-10-24 MED ORDER — PRAVASTATIN SODIUM 20 MG PO TABS: 10.0000 mg | ORAL_TABLET | Freq: Every day | ORAL | Status: DC

## 2023-10-24 NOTE — Discharge Summary (Signed)
 Physician Discharge Summary   Patient: Luis Burke MRN: [id number] DOB: October 04, 1956  Admit date:     10/23/2023  Discharge date: 10/24/23  Discharge Physician: Concepcion Riser   PCP: Cleatus Arlyss RAMAN, MD   Recommendations at discharge:    PCP follow up in 1 week Oncology follow up as scheduled.  Discharge Diagnoses: Principal Problem:   RSV infection Active Problems:   Multiple myeloma (HCC)   Thrombocytopenia (HCC)   HLD (hyperlipidemia)   Essential hypertension, benign   Stage 3a chronic kidney disease (HCC)   Obesity (BMI 30-39.9)   Diarrhea  Resolved Problems:   * No resolved hospital problems. *  Hospital Course: Luis Burke is a 68 y.o. male with medical history significant of multiple myeloma on chemotherapy (s/p of bone marrow transplantation 2020), hypertension, hyperlipidemia, CKD-3A, obesity, thrombocytopenia, carotid artery stenosis, who presents with cough and shortness of breath.   Patient has been having symptoms since last 4 days progressively worsening.  Patient has generalized weakness, watery diarrhea, recent RSV infection was started on Augmentin  therapy.  Upon presentation to the emergency department he was noted to have fever of 103.  Respiratory panel positive for RSV.  Got gentle IV hydration, azithromycin  therapy.   Today patient feels much better, wishes to go home.  He is eating fair, able to ambulate.  No hypoxia noted.  I advised supportive care, antitussive  medications, Tylenol  for fever.  No antibiotics recommended.  I advised him to follow-up with PCP upon discharge as instructed.  Advised to follow-up with oncology.  Patient understands and agrees with the discharge plan.       Consultants: none Procedures performed: none  Disposition: Home Diet recommendation:  Discharge Diet Orders (From admission, onward)     Start     Ordered   10/24/23 0000  Diet - low sodium heart healthy        10/24/23 1136           Cardiac  diet DISCHARGE MEDICATION: Allergies as of 10/24/2023       Reactions   Bactrim  [sulfamethoxazole -trimethoprim]    Elevated creatinine   Clonidine  Derivatives    Low platelets, edema   Tadalafil    Intolerant,  Didn't feel well on med        Medication List     STOP taking these medications    amoxicillin -clavulanate 875-125 MG tablet Commonly known as: AUGMENTIN        TAKE these medications    acetaminophen  325 MG tablet Commonly known as: TYLENOL  Take 2 tablets (650 mg total) by mouth See admin instructions. 1 hour prior to chemotherapy treatments   acyclovir  400 MG tablet Commonly known as: ZOVIRAX  TAKE 1 TABLET(400 MG) BY MOUTH TWICE DAILY   albuterol  108 (90 Base) MCG/ACT inhaler Commonly known as: VENTOLIN  HFA Inhale 2 puffs into the lungs every 6 (six) hours as needed for wheezing or shortness of breath.   amLODipine  5 MG tablet Commonly known as: NORVASC  Take 2 tablets (10 mg total) by mouth daily.   ASPIRIN  81 PO Take 81 mg by mouth daily.   co-enzyme Q-10 50 MG capsule Take 50 mg by mouth daily.   [employer] VITAMIN B12 1000 MCG tablet Generic drug: cyanocobalamin  TAKE 1 TABLET BY MOUTH EVERY DAY   dexamethasone  4 MG tablet Commonly known as: DECADRON  Take 5 tablets (20 mg total) by mouth as directed. Take one hour before monthly Darzalex  injection.   dextromethorphan -guaiFENesin  30-600 MG 12hr tablet Commonly known as: MUCINEX  DM Take  1 tablet by mouth 2 (two) times daily as needed for up to 10 days for cough.   diphenhydrAMINE  50 MG tablet Commonly known as: BENADRYL  Take 1 tablet (50 mg total) by mouth See admin instructions. Take 1 tablet 1 hour prior to chemotherapy treatments.   hydrALAZINE  10 MG tablet Commonly known as: APRESOLINE  TAKE 1 TABLET(10 MG) BY MOUTH THREE TIMES DAILY   lenalidomide  5 MG capsule Commonly known as: REVLIMID  Take 1 capsule (5 mg total) by mouth daily. Take for 21 days, then hold for 7 days. Repeat every 28  days.   lisinopril  20 MG tablet Commonly known as: ZESTRIL  Take 1 tablet (20 mg total) by mouth daily.   magnesium  chloride 64 MG Tbec SR tablet Commonly known as: SLOW-MAG Take 1 tablet (64 mg total) by mouth daily.   metoprolol  succinate 50 MG 24 hr tablet Commonly known as: TOPROL -XL TAKE 2 TABLETS BY MOUTH TWICE A DAY WITH OR IMMEDIATELY FOLLOWING A MEAL   ondansetron  8 MG tablet Commonly known as: ZOFRAN  Take 1 tablet (8 mg total) by mouth every 8 (eight) hours as needed for nausea or vomiting.   pravastatin  10 MG tablet Commonly known as: PRAVACHOL  Take 1 tablet (10 mg total) by mouth daily.   prochlorperazine  10 MG tablet Commonly known as: COMPAZINE  Take 1 tablet (10 mg total) by mouth every 6 (six) hours as needed for nausea or vomiting.   sildenafil  20 MG tablet Commonly known as: REVATIO  Take 1-5 tablets (20-100 mg total) by mouth daily as needed.   VITAMIN B COMPLEX PO Take 1 Dose by mouth every other day.   zinc gluconate 50 MG tablet Take 50 mg by mouth daily.        Follow-up Information     Cleatus Arlyss RAMAN, MD Follow up in 1 week(s).   Specialty: Family Medicine Contact information: [address] [place] [address] [phone]                Discharge Exam: [name]   10/23/23 1329  Weight: 93 kg   General - Elderly Caucasian male, no apparent distress HEENT - PERRLA, EOMI, atraumatic head, non tender sinuses. Lung - Clear, rales, rhonchi, wheezes. Heart - S1, S2 heard, no murmurs, rubs, no pedal edema Neuro - Alert, awake and oriented x 3, non focal exam. Skin - Warm and dry.  Condition at discharge: stable  The results of significant diagnostics from this hospitalization (including imaging, microbiology, ancillary and laboratory) are listed below for reference.   Imaging Studies: DG Chest 2 View Result Date: 10/23/2023 CLINICAL DATA:  Cough.  No appetite.  Shortness of breath. EXAM: CHEST - 2 VIEW  COMPARISON:  10/20/2023. FINDINGS: Low lung volume. Bilateral lung fields are clear. Bilateral costophrenic angles are clear. Stable cardio-mediastinal silhouette. No acute osseous abnormalities. The soft tissues are within normal limits. IMPRESSION: No active cardiopulmonary disease. Electronically Signed   By: Ree Molt M.D.   On: 10/23/2023 14:57   DG Chest 2 View Result Date: 10/20/2023 CLINICAL DATA:  Chest congestion and cough. History of multiple myeloma. EXAM: CHEST - 2 VIEW COMPARISON:  Chest two views 08/12/2022 FINDINGS: Cardiac silhouette and mediastinal contours are within limits. Mildly decreased lung volumes, worsened from prior. Mild bibasilar bronchovascular crowding. No pleural effusion or pneumothorax. Moderate multilevel degenerative disc changes of the thoracic spine. IMPRESSION: Mildly decreased lung volumes with mild bibasilar bronchovascular crowding. Electronically Signed   By: Tanda Lyons M.D.   On: 10/20/2023 10:59  Microbiology: Results for orders placed or performed during the hospital encounter of 10/23/23  Culture, blood (routine x 2)     Status: None (Preliminary result)   Collection Time: 10/23/23  5:18 PM   Specimen: BLOOD  Result Value Ref Range Status   Specimen Description BLOOD LEFT ASSIST CONTROL  Final   Special Requests   Final    BOTTLES DRAWN AEROBIC AND ANAEROBIC Blood Culture results may not be optimal due to an inadequate volume of blood received in culture bottles   Culture   Final    NO GROWTH < 24 HOURS Performed at [facility], [address], [place], [place] [address]    Report Status PENDING  Incomplete  Culture, blood (routine x 2)     Status: None (Preliminary result)   Collection Time: 10/23/23  6:01 PM   Specimen: BLOOD  Result Value Ref Range Status   Specimen Description BLOOD LEFT ANTECUBITAL  Final   Special Requests   Final    BOTTLES DRAWN AEROBIC AND ANAEROBIC Blood Culture results may not be optimal due to  an inadequate volume of blood received in culture bottles   Culture   Final    NO GROWTH < 12 HOURS Performed at [facility], [address] [place], [place] [address]    Report Status PENDING  Incomplete  C Difficile Quick Screen w PCR reflex     Status: None   Collection Time: 10/24/23  8:10 AM   Specimen: STOOL  Result Value Ref Range Status   C Diff antigen NEGATIVE NEGATIVE Final   C Diff toxin NEGATIVE NEGATIVE Final   C Diff interpretation No C. difficile detected.  Final    Comment: Performed at [facility], [address] [place], [place] [address]   *Note: Due to a large number of results and/or encounters for the requested time period, some results have not been displayed. A complete set of results can be found in Results Review.    Labs: CBC: Recent Labs  Lab 10/20/23 1121 10/23/23 1402 10/24/23 0512  WBC 9.5 6.0 2.8*  NEUTROABS 6.9 4.5  --   HGB 14.7 13.6 11.3*  HCT 43.2 39.3 32.9*  MCV 100.7* 100.5* 99.7  PLT 78* 91* 73*   Basic Metabolic Panel: Recent Labs  Lab 10/20/23 1121 10/23/23 1402 10/24/23 0512  NA 138 137 135  K 4.9 4.6 4.2  CL 102 102 105  CO2 27 24 24   GLUCOSE 121* 121* 103*  BUN 20 21 19   CREATININE 1.42* 1.54* 1.29*  CALCIUM 9.8 9.2 7.8*   Liver Function Tests: Recent Labs  Lab 10/20/23 1121 10/23/23 1402  AST 22 25  ALT 21 21  ALKPHOS 62 51  BILITOT 0.9 0.5  PROT 7.2 7.0  ALBUMIN 4.5 4.0   CBG: No results for input(s): GLUCAP in the last 168 hours.  Discharge time spent: 35 minutes.  Signed: Concepcion Riser, MD [facility] 10/24/2023

## 2023-10-28 LAB — CULTURE, BLOOD (ROUTINE X 2)
Culture: NO GROWTH
Culture: NO GROWTH

## 2023-11-06 ENCOUNTER — Other Ambulatory Visit: Payer: Self-pay | Admitting: Oncology

## 2023-11-06 ENCOUNTER — Telehealth: Payer: Self-pay | Admitting: Oncology

## 2023-11-06 NOTE — Telephone Encounter (Signed)
Patient called requesting a call back re: info on upcoming appointment please call him [phone]

## 2023-11-07 ENCOUNTER — Other Ambulatory Visit: Payer: Self-pay | Admitting: Family Medicine

## 2023-11-07 NOTE — Telephone Encounter (Signed)
Last office visit: 07/31/23 Next office visit: nothing scheduled Last refill: SILDENAFIL 20 MG TABLET 07/31/23 12 tablets 5 refills

## 2023-11-10 ENCOUNTER — Inpatient Hospital Stay: Payer: Medicare Other

## 2023-11-10 ENCOUNTER — Inpatient Hospital Stay: Payer: Medicare Other | Admitting: Oncology

## 2023-11-13 ENCOUNTER — Ambulatory Visit: Payer: Self-pay | Admitting: Family Medicine

## 2023-11-13 NOTE — Telephone Encounter (Signed)
Copied from CRM [id number]. Topic: Clinical - Medication Question >> Nov 13, 2023  8:33 AM Luis Burke wrote: Reason for CRM: Pt would like to confirm if he can stop taking the lisinopril for a period of time? He stated that he recently had RSV and was informed by his family that if he stops taking the lisinopril the lingering cough from the RS  will go away. Pt did say he did not take his lisinopril pill this morning.

## 2023-11-13 NOTE — Telephone Encounter (Signed)
The cough from RSV could be a separate issue from lisinopril.  If he is lightheaded and his BP is low, then I would hold lisinopril.  Otherwise, I would still continue lisinopril.  Thanks.

## 2023-11-13 NOTE — Telephone Encounter (Signed)
Informed patient of Dr. Lianne Bushy suggestion, patient understood

## 2023-11-18 ENCOUNTER — Other Ambulatory Visit: Payer: Self-pay | Admitting: Oncology

## 2023-11-24 ENCOUNTER — Other Ambulatory Visit: Payer: Self-pay | Admitting: Oncology

## 2023-11-24 ENCOUNTER — Telehealth: Payer: Self-pay | Admitting: *Deleted

## 2023-11-24 ENCOUNTER — Other Ambulatory Visit: Payer: Self-pay

## 2023-11-24 DIAGNOSIS — C9000 Multiple myeloma not having achieved remission: Secondary | ICD-10-CM

## 2023-11-24 NOTE — Telephone Encounter (Signed)
 Patient called and said he got a telephone call from [facility] with Dr. Albertus Hughs resume treatment at [facility] and he is going to [facility] with treatment he wants a call about what to do here and he says there is some outlying appts and need to know about things here also

## 2023-11-25 ENCOUNTER — Other Ambulatory Visit: Payer: Self-pay

## 2023-11-25 ENCOUNTER — Encounter: Payer: Self-pay | Admitting: Oncology

## 2023-11-25 NOTE — Telephone Encounter (Signed)
Spoke to pt, he is aware of appt details for 2/14.

## 2023-11-26 ENCOUNTER — Ambulatory Visit: Payer: Self-pay | Admitting: Urology

## 2023-11-27 ENCOUNTER — Encounter: Payer: Self-pay | Admitting: Oncology

## 2023-11-27 ENCOUNTER — Other Ambulatory Visit: Payer: Self-pay

## 2023-11-27 NOTE — Assessment & Plan Note (Signed)
Kappa Light chain multiple myeloma beta 2 microglobulin 5 and normal albumin.  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  06/04/2021 5% plasma cells -August 2022-February 2023, patient completed 7 cycles of daratumumab/Pomalyst/dexamethasone. 11/20/2021, status post bone marrow biopsy which showed 1% of plasma cells.  Likely he has minimal residual disease.  Currently on daratumumab maintenance. He takes his supply of dexamethasone 20mg  prior to his treatment.  Labs are reviewed and discussed with patient.  Lab Results  Component Value Date   MPROTEIN Not Observed 10/13/2023   KPAFRELGTCHN 75.1 (H) 10/13/2023   LAMBDASER 8.8 10/13/2023   KAPLAMBRATIO 8.53 (H) 10/13/2023   Myeloma is slowly progressing (biochemical relapse), BM biopsy showed 1 % plasma cells, kappa light chain, cytogenetics showed Y chromosome loss, negative myeloma FISH Today's level is pending.   [facility] recommendation was reviewed.he did not tolerate Revlimid 5mg  D1-21 Q28 days, off Revlimid currently.  Proceed with daratumumab today Discussed with [facility] Dr. Alvino Chapel, pending approval for Carvykti CAR-T.

## 2023-11-28 ENCOUNTER — Ambulatory Visit
Admission: RE | Admit: 2023-11-28 | Discharge: 2023-11-28 | Disposition: A | Discharge status: Home / Self Care | Payer: Medicare Other | Source: Ambulatory Visit | Attending: Oncology | Admitting: Oncology

## 2023-11-28 ENCOUNTER — Inpatient Hospital Stay (HOSPITAL_BASED_OUTPATIENT_CLINIC_OR_DEPARTMENT_OTHER): Payer: Medicare Other | Admitting: Oncology

## 2023-11-28 ENCOUNTER — Encounter: Payer: Self-pay | Admitting: Oncology

## 2023-11-28 ENCOUNTER — Inpatient Hospital Stay: Payer: Medicare Other

## 2023-11-28 ENCOUNTER — Inpatient Hospital Stay: Payer: Medicare Other | Attending: Oncology

## 2023-11-28 VITALS — BP 138/79 | HR 64 | Temp 97.5°F | Resp 18 | Wt 206.2 lb

## 2023-11-28 DIAGNOSIS — N1831 Chronic kidney disease, stage 3a: Secondary | ICD-10-CM

## 2023-11-28 DIAGNOSIS — R052 Subacute cough: Secondary | ICD-10-CM

## 2023-11-28 DIAGNOSIS — Z8042 Family history of malignant neoplasm of prostate: Secondary | ICD-10-CM | POA: Diagnosis not present

## 2023-11-28 DIAGNOSIS — Z9484 Stem cells transplant status: Secondary | ICD-10-CM | POA: Diagnosis not present

## 2023-11-28 DIAGNOSIS — Z5112 Encounter for antineoplastic immunotherapy: Secondary | ICD-10-CM | POA: Diagnosis present

## 2023-11-28 DIAGNOSIS — J209 Acute bronchitis, unspecified: Secondary | ICD-10-CM | POA: Insufficient documentation

## 2023-11-28 DIAGNOSIS — C9000 Multiple myeloma not having achieved remission: Secondary | ICD-10-CM | POA: Insufficient documentation

## 2023-11-28 DIAGNOSIS — D696 Thrombocytopenia, unspecified: Secondary | ICD-10-CM | POA: Insufficient documentation

## 2023-11-28 DIAGNOSIS — N179 Acute kidney failure, unspecified: Secondary | ICD-10-CM

## 2023-11-28 DIAGNOSIS — Z800 Family history of malignant neoplasm of digestive organs: Secondary | ICD-10-CM | POA: Diagnosis not present

## 2023-11-28 DIAGNOSIS — Z5111 Encounter for antineoplastic chemotherapy: Secondary | ICD-10-CM | POA: Diagnosis not present

## 2023-11-28 DIAGNOSIS — R771 Abnormality of globulin: Secondary | ICD-10-CM | POA: Diagnosis not present

## 2023-11-28 DIAGNOSIS — Z9481 Bone marrow transplant status: Secondary | ICD-10-CM | POA: Insufficient documentation

## 2023-11-28 DIAGNOSIS — C7951 Secondary malignant neoplasm of bone: Secondary | ICD-10-CM

## 2023-11-28 LAB — CBC WITH DIFFERENTIAL/PLATELET
Abs Immature Granulocytes: 0.01 10*3/uL (ref 0.00–0.07)
Basophils Absolute: 0 10*3/uL (ref 0.0–0.1)
Basophils Relative: 0 %
Eosinophils Absolute: 0.1 10*3/uL (ref 0.0–0.5)
Eosinophils Relative: 3 %
HCT: 40.1 % (ref 39.0–52.0)
Hemoglobin: 13.4 g/dL (ref 13.0–17.0)
Immature Granulocytes: 0 %
Lymphocytes Relative: 30 %
Lymphs Abs: 1.2 10*3/uL (ref 0.7–4.0)
MCH: 33.7 pg (ref 26.0–34.0)
MCHC: 33.4 g/dL (ref 30.0–36.0)
MCV: 100.8 fL — ABNORMAL HIGH (ref 80.0–100.0)
Monocytes Absolute: 0.3 10*3/uL (ref 0.1–1.0)
Monocytes Relative: 7 %
Neutro Abs: 2.4 10*3/uL (ref 1.7–7.7)
Neutrophils Relative %: 60 %
Platelets: 73 10*3/uL — ABNORMAL LOW (ref 150–400)
RBC: 3.98 MIL/uL — ABNORMAL LOW (ref 4.22–5.81)
RDW: 13.2 % (ref 11.5–15.5)
WBC: 4 10*3/uL (ref 4.0–10.5)
nRBC: 0 % (ref 0.0–0.2)

## 2023-11-28 LAB — COMPREHENSIVE METABOLIC PANEL
ALT: 21 U/L (ref 0–44)
AST: 19 U/L (ref 15–41)
Albumin: 4.4 g/dL (ref 3.5–5.0)
Alkaline Phosphatase: 70 U/L (ref 38–126)
Anion gap: 8 (ref 5–15)
BUN: 18 mg/dL (ref 8–23)
CO2: 25 mmol/L (ref 22–32)
Calcium: 9 mg/dL (ref 8.9–10.3)
Chloride: 105 mmol/L (ref 98–111)
Creatinine, Ser: 1.25 mg/dL — ABNORMAL HIGH (ref 0.61–1.24)
GFR, Estimated: >60 mL/min (ref >60)
Glucose, Bld: 100 mg/dL — ABNORMAL HIGH (ref 70–99)
Potassium: 3.8 mmol/L (ref 3.5–5.1)
Sodium: 138 mmol/L (ref 135–145)
Total Bilirubin: 0.7 mg/dL (ref 0.0–1.2)
Total Protein: 6.9 g/dL (ref 6.5–8.1)

## 2023-11-28 MED ORDER — AZITHROMYCIN 250 MG PO TABS: ORAL_TABLET | ORAL | 0 refills | Status: DC

## 2023-11-28 MED ORDER — DARATUMUMAB-HYALURONIDASE-FIHJ 1800-30000 MG-UT/15ML ~~LOC~~ SOLN
1800.0000 mg | Freq: Once | SUBCUTANEOUS | Status: AC
  Administered 2023-11-28: 1800 mg via SUBCUTANEOUS
  Filled 2023-11-28: qty 15

## 2023-11-28 MED ORDER — DENOSUMAB 120 MG/1.7ML ~~LOC~~ SOLN
120.0000 mg | Freq: Once | SUBCUTANEOUS | Status: AC
  Administered 2023-11-28: 120 mg via SUBCUTANEOUS
  Filled 2023-11-28: qty 1.7

## 2023-11-28 NOTE — Patient Instructions (Signed)
[facility] - A DEPT OF [facility]  Discharge Instructions: Thank you for choosing [facility] to provide your oncology and hematology care.  If you have a lab appointment with the Cancer Center, please go directly to the Cancer Center and check in at the registration area.  Wear comfortable clothing and clothing appropriate for easy access to any Portacath or PICC line.   We strive to give you quality time with your provider. You may need to reschedule your appointment if you arrive late (15 or more minutes).  Arriving late affects you and other patients whose appointments are after yours.  Also, if you miss three or more appointments without notifying the office, you may be dismissed from the clinic at the provider's discretion.      For prescription refill requests, have your pharmacy contact our office and allow 72 hours for refills to be completed.    Today you received the following chemotherapy and/or immunotherapy agents Darzalex      To help prevent nausea and vomiting after your treatment, we encourage you to take your nausea medication as directed.  BELOW ARE SYMPTOMS THAT SHOULD BE REPORTED IMMEDIATELY: *FEVER GREATER THAN 100.4 F (38 C) OR HIGHER *CHILLS OR SWEATING *NAUSEA AND VOMITING THAT IS NOT CONTROLLED WITH YOUR NAUSEA MEDICATION *UNUSUAL SHORTNESS OF BREATH *UNUSUAL BRUISING OR BLEEDING *URINARY PROBLEMS (pain or burning when urinating, or frequent urination) *BOWEL PROBLEMS (unusual diarrhea, constipation, pain near the anus) TENDERNESS IN MOUTH AND THROAT WITH OR WITHOUT PRESENCE OF ULCERS (sore throat, sores in mouth, or a toothache) UNUSUAL RASH, SWELLING OR PAIN  UNUSUAL VAGINAL DISCHARGE OR ITCHING   Items with * indicate a potential emergency and should be followed up as soon as possible or go to the Emergency Department if any problems should occur.  Please show the CHEMOTHERAPY ALERT CARD or IMMUNOTHERAPY  ALERT CARD at check-in to the Emergency Department and triage nurse.  Should you have questions after your visit or need to cancel or reschedule your appointment, please contact [facility] - A DEPT OF [facility]  [phone] and follow the prompts.  Office hours are 8:00 a.m. to 4:30 p.m. Monday - Friday. Please note that voicemails left after 4:00 p.m. may not be returned until the following business day.  We are closed weekends and major holidays. You have access to a nurse at all times for urgent questions. Please call the main number to the clinic [phone] and follow the prompts.  For any non-urgent questions, you may also contact your provider using MyChart. We now offer e-Visits for anyone 68 and older to request care online for non-urgent symptoms. For details visit [email or web address].   Also download the MyChart app! Go to the app store, search "MyChart", open the app, select [place], and log in with your MyChart username and password.

## 2023-11-28 NOTE — Assessment & Plan Note (Signed)
Decreased, will monitor counts.  Immature platelet fraction is low, likely due to marrow suppression.

## 2023-11-28 NOTE — Assessment & Plan Note (Signed)
Avoid nephrotoxin.  Encourage hydration.

## 2023-11-28 NOTE — Assessment & Plan Note (Signed)
CXR reviewed and discussed with patient over the phone.  Recommend Azithromycin 5 days course. Rx sent.

## 2023-11-28 NOTE — Assessment & Plan Note (Signed)
IVIG if IgG <400

## 2023-11-28 NOTE — Assessment & Plan Note (Signed)
Treatment plan as listed above.
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
## 2023-11-28 NOTE — Assessment & Plan Note (Signed)
Majority of his lucencies were not hypermetabolic on previous PET scan Xgeva monthly- Continue calcium and vitamin D supplementation.

## 2023-11-29 ENCOUNTER — Other Ambulatory Visit: Payer: Self-pay

## 2023-12-01 LAB — KAPPA/LAMBDA LIGHT CHAINS
Kappa free light chain: 116.3 mg/L — ABNORMAL HIGH (ref 3.3–19.4)
Kappa, lambda light chain ratio: 19.38 — ABNORMAL HIGH (ref 0.26–1.65)
Lambda free light chains: 6 mg/L (ref 5.7–26.3)

## 2023-12-02 LAB — MULTIPLE MYELOMA PANEL, SERUM
Albumin SerPl Elph-Mcnc: 3.9 g/dL (ref 2.9–4.4)
Albumin/Glob SerPl: 1.7 (ref 0.7–1.7)
Alpha 1: 0.3 g/dL (ref 0.0–0.4)
Alpha2 Glob SerPl Elph-Mcnc: 0.7 g/dL (ref 0.4–1.0)
B-Globulin SerPl Elph-Mcnc: 1 g/dL (ref 0.7–1.3)
Gamma Glob SerPl Elph-Mcnc: 0.3 g/dL — ABNORMAL LOW (ref 0.4–1.8)
Globulin, Total: 2.3 g/dL (ref 2.2–3.9)
IgA: 69 mg/dL (ref 61–437)
IgG (Immunoglobin G), Serum: 436 mg/dL — ABNORMAL LOW (ref 603–1613)
IgM (Immunoglobulin M), Srm: 14 mg/dL — ABNORMAL LOW (ref 20–172)
Total Protein ELP: 6.2 g/dL (ref 6.0–8.5)

## 2023-12-15 ENCOUNTER — Telehealth: Payer: Self-pay

## 2023-12-15 NOTE — Telephone Encounter (Signed)
 Patient's care team at [facility] has informed him the the Urgent care needs program has now closed.

## 2023-12-15 NOTE — Telephone Encounter (Signed)
 I have called LLS program on 2 occasions today with wait time of 10 minutes for both calls.

## 2023-12-15 NOTE — Telephone Encounter (Signed)
 Patient has spoken to a representative at the [facility] program ([employer]) and was informed they do have an "urgent need program" open.  He is requesting we call LLS program at [phone] to provide information for him.

## 2023-12-19 ENCOUNTER — Other Ambulatory Visit: Payer: Self-pay

## 2023-12-19 DIAGNOSIS — C9000 Multiple myeloma not having achieved remission: Secondary | ICD-10-CM

## 2023-12-22 ENCOUNTER — Other Ambulatory Visit: Payer: Self-pay | Admitting: Oncology

## 2023-12-22 ENCOUNTER — Encounter: Payer: Self-pay | Admitting: Oncology

## 2023-12-22 ENCOUNTER — Telehealth: Payer: Self-pay | Admitting: Oncology

## 2023-12-22 DIAGNOSIS — C9000 Multiple myeloma not having achieved remission: Secondary | ICD-10-CM

## 2023-12-22 MED ORDER — DEXAMETHASONE 4 MG PO TABS: 20.0000 mg | ORAL_TABLET | ORAL | 1 refills | Status: DC

## 2023-12-22 NOTE — Telephone Encounter (Signed)
 Dr. Cathie Hoops has updated IS to next week. Please update him of new appt details

## 2023-12-22 NOTE — Telephone Encounter (Signed)
 Patient called to reschedule. He would like to try for the first of next week. He will be at [facility] for a transplant from April 7-May 15. He wants a vacation before he is sat [facility] for that long period of time. Please call him when appointments are rescheduled  [phone]

## 2023-12-23 ENCOUNTER — Other Ambulatory Visit: Payer: Self-pay

## 2023-12-26 ENCOUNTER — Ambulatory Visit: Payer: Medicare Other

## 2023-12-26 ENCOUNTER — Ambulatory Visit: Payer: Medicare Other | Admitting: Oncology

## 2023-12-26 ENCOUNTER — Other Ambulatory Visit: Payer: Medicare Other

## 2023-12-30 ENCOUNTER — Encounter: Payer: Self-pay | Admitting: Oncology

## 2023-12-30 ENCOUNTER — Inpatient Hospital Stay (HOSPITAL_BASED_OUTPATIENT_CLINIC_OR_DEPARTMENT_OTHER): Admitting: Oncology

## 2023-12-30 ENCOUNTER — Inpatient Hospital Stay

## 2023-12-30 ENCOUNTER — Inpatient Hospital Stay: Attending: Oncology

## 2023-12-30 VITALS — BP 143/89 | HR 67 | Temp 96.0°F | Resp 18 | Wt 209.1 lb

## 2023-12-30 DIAGNOSIS — D696 Thrombocytopenia, unspecified: Secondary | ICD-10-CM | POA: Diagnosis not present

## 2023-12-30 DIAGNOSIS — Z5111 Encounter for antineoplastic chemotherapy: Secondary | ICD-10-CM

## 2023-12-30 DIAGNOSIS — Z9481 Bone marrow transplant status: Secondary | ICD-10-CM | POA: Diagnosis not present

## 2023-12-30 DIAGNOSIS — Z5112 Encounter for antineoplastic immunotherapy: Secondary | ICD-10-CM | POA: Diagnosis present

## 2023-12-30 DIAGNOSIS — N1831 Chronic kidney disease, stage 3a: Secondary | ICD-10-CM | POA: Insufficient documentation

## 2023-12-30 DIAGNOSIS — C7951 Secondary malignant neoplasm of bone: Secondary | ICD-10-CM

## 2023-12-30 DIAGNOSIS — Z9484 Stem cells transplant status: Secondary | ICD-10-CM | POA: Diagnosis not present

## 2023-12-30 DIAGNOSIS — C9000 Multiple myeloma not having achieved remission: Secondary | ICD-10-CM

## 2023-12-30 LAB — COMPREHENSIVE METABOLIC PANEL
ALT: 23 U/L (ref 0–44)
AST: 24 U/L (ref 15–41)
Albumin: 4.4 g/dL (ref 3.5–5.0)
Alkaline Phosphatase: 55 U/L (ref 38–126)
Anion gap: 9 (ref 5–15)
BUN: 18 mg/dL (ref 8–23)
CO2: 24 mmol/L (ref 22–32)
Calcium: 9.4 mg/dL (ref 8.9–10.3)
Chloride: 104 mmol/L (ref 98–111)
Creatinine, Ser: 1.39 mg/dL — ABNORMAL HIGH (ref 0.61–1.24)
GFR, Estimated: 56 mL/min — ABNORMAL LOW (ref >60)
Glucose, Bld: 108 mg/dL — ABNORMAL HIGH (ref 70–99)
Potassium: 3.8 mmol/L (ref 3.5–5.1)
Sodium: 137 mmol/L (ref 135–145)
Total Bilirubin: 0.9 mg/dL (ref 0.0–1.2)
Total Protein: 7 g/dL (ref 6.5–8.1)

## 2023-12-30 LAB — CBC WITH DIFFERENTIAL/PLATELET
Abs Immature Granulocytes: 0.01 10*3/uL (ref 0.00–0.07)
Basophils Absolute: 0 10*3/uL (ref 0.0–0.1)
Basophils Relative: 0 %
Eosinophils Absolute: 0.1 10*3/uL (ref 0.0–0.5)
Eosinophils Relative: 1 %
HCT: 41.5 % (ref 39.0–52.0)
Hemoglobin: 14.1 g/dL (ref 13.0–17.0)
Immature Granulocytes: 0 %
Lymphocytes Relative: 34 %
Lymphs Abs: 1.7 10*3/uL (ref 0.7–4.0)
MCH: 34.1 pg — ABNORMAL HIGH (ref 26.0–34.0)
MCHC: 34 g/dL (ref 30.0–36.0)
MCV: 100.2 fL — ABNORMAL HIGH (ref 80.0–100.0)
Monocytes Absolute: 0.4 10*3/uL (ref 0.1–1.0)
Monocytes Relative: 8 %
Neutro Abs: 2.8 10*3/uL (ref 1.7–7.7)
Neutrophils Relative %: 57 %
Platelets: 82 10*3/uL — ABNORMAL LOW (ref 150–400)
RBC: 4.14 MIL/uL — ABNORMAL LOW (ref 4.22–5.81)
RDW: 13.2 % (ref 11.5–15.5)
WBC: 4.9 10*3/uL (ref 4.0–10.5)
nRBC: 0 % (ref 0.0–0.2)

## 2023-12-30 MED ORDER — DARATUMUMAB-HYALURONIDASE-FIHJ 1800-30000 MG-UT/15ML ~~LOC~~ SOLN
1800.0000 mg | Freq: Once | SUBCUTANEOUS | Status: AC
  Administered 2023-12-30: 1800 mg via SUBCUTANEOUS
  Filled 2023-12-30: qty 15

## 2023-12-30 NOTE — Assessment & Plan Note (Signed)
 Kappa Light chain multiple myeloma beta 2 microglobulin 5 and normal albumin.  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  06/04/2021 5% plasma cells -August 2022-February 2023, patient completed 7 cycles of daratumumab/Pomalyst/dexamethasone. 11/20/2021, bone marrow biopsy which showed 1% of plasma cells.  Started Daratumumab maintenance.  05/08/2023 bone marrow biopsy: persistent low-level plasma cell neoplasm.  09/ 2024: added Revlimid to Daratumumab / Dexamethasone, Revlimid was poorly tolerated and was discontinued  Labs are reviewed and discussed with patient.  Lab Results  Component Value Date   MPROTEIN Not Observed 11/28/2023   KPAFRELGTCHN 116.3 (H) 11/28/2023   LAMBDASER 6.0 11/28/2023   KAPLAMBRATIO 19.38 (H) 11/28/2023  11/21/2023 T-cell collection (apheresis) for Carvykti CAR-T manufacturing  Today's level is pending. [facility] recommendation was reviewed. Resume daratumumab maintenance. He takes his supply of dexamethasone 20mg  prior to his treatment.  Proceed with daratumumab today He will proceed with  Carvykti CAR-T at [facility]

## 2023-12-30 NOTE — Assessment & Plan Note (Signed)
 Majority of his lucencies were not hypermetabolic on previous PET scan Xgeva monthly last dose in Feb 2025, will resume after his CAR-T treatments. -  Continue calcium and vitamin D supplementation.

## 2023-12-30 NOTE — Assessment & Plan Note (Signed)
 Decreased, will monitor counts.  Immature platelet fraction is low, likely due to marrow suppression.

## 2023-12-30 NOTE — Assessment & Plan Note (Signed)
 Treatment plan as listed above.

## 2023-12-30 NOTE — Progress Notes (Signed)
 Pt reports taking premedications prior to arrival (including dexamethasone)

## 2023-12-30 NOTE — Assessment & Plan Note (Signed)
 Avoid nephrotoxin.  Encourage hydration.
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
## 2023-12-30 NOTE — Patient Instructions (Signed)
 [facility] - A DEPT OF [facility]  Discharge Instructions: Thank you for choosing [facility] to provide your oncology and hematology care.  If you have a lab appointment with the Cancer Center, please go directly to the Cancer Center and check in at the registration area.  Wear comfortable clothing and clothing appropriate for easy access to any Portacath or PICC line.   We strive to give you quality time with your provider. You may need to reschedule your appointment if you arrive late (15 or more minutes).  Arriving late affects you and other patients whose appointments are after yours.  Also, if you miss three or more appointments without notifying the office, you may be dismissed from the clinic at the provider's discretion.      For prescription refill requests, have your pharmacy contact our office and allow 72 hours for refills to be completed.    Today you received the following chemotherapy and/or immunotherapy agents Darzalex      To help prevent nausea and vomiting after your treatment, we encourage you to take your nausea medication as directed.  BELOW ARE SYMPTOMS THAT SHOULD BE REPORTED IMMEDIATELY: *FEVER GREATER THAN 100.4 F (38 C) OR HIGHER *CHILLS OR SWEATING *NAUSEA AND VOMITING THAT IS NOT CONTROLLED WITH YOUR NAUSEA MEDICATION *UNUSUAL SHORTNESS OF BREATH *UNUSUAL BRUISING OR BLEEDING *URINARY PROBLEMS (pain or burning when urinating, or frequent urination) *BOWEL PROBLEMS (unusual diarrhea, constipation, pain near the anus) TENDERNESS IN MOUTH AND THROAT WITH OR WITHOUT PRESENCE OF ULCERS (sore throat, sores in mouth, or a toothache) UNUSUAL RASH, SWELLING OR PAIN  UNUSUAL VAGINAL DISCHARGE OR ITCHING   Items with * indicate a potential emergency and should be followed up as soon as possible or go to the Emergency Department if any problems should occur.  Please show the CHEMOTHERAPY ALERT CARD or IMMUNOTHERAPY  ALERT CARD at check-in to the Emergency Department and triage nurse.  Should you have questions after your visit or need to cancel or reschedule your appointment, please contact [facility] - A DEPT OF [facility]  [phone] and follow the prompts.  Office hours are 8:00 a.m. to 4:30 p.m. Monday - Friday. Please note that voicemails left after 4:00 p.m. may not be returned until the following business day.  We are closed weekends and major holidays. You have access to a nurse at all times for urgent questions. Please call the main number to the clinic [phone] and follow the prompts.  For any non-urgent questions, you may also contact your provider using MyChart. We now offer e-Visits for anyone 68 and older to request care online for non-urgent symptoms. For details visit [email or web address].   Also download the MyChart app! Go to the app store, search "MyChart", open the app, select [place], and log in with your MyChart username and password.

## 2023-12-31 LAB — KAPPA/LAMBDA LIGHT CHAINS
Kappa free light chain: 99.6 mg/L — ABNORMAL HIGH (ref 3.3–19.4)
Kappa, lambda light chain ratio: 22.64 — ABNORMAL HIGH (ref 0.26–1.65)
Lambda free light chains: 4.4 mg/L — ABNORMAL LOW (ref 5.7–26.3)

## 2024-01-01 LAB — MULTIPLE MYELOMA PANEL, SERUM
Albumin SerPl Elph-Mcnc: 4 g/dL (ref 2.9–4.4)
Albumin/Glob SerPl: 1.7 (ref 0.7–1.7)
Alpha 1: 0.3 g/dL (ref 0.0–0.4)
Alpha2 Glob SerPl Elph-Mcnc: 0.7 g/dL (ref 0.4–1.0)
B-Globulin SerPl Elph-Mcnc: 1 g/dL (ref 0.7–1.3)
Gamma Glob SerPl Elph-Mcnc: 0.4 g/dL (ref 0.4–1.8)
Globulin, Total: 2.4 g/dL (ref 2.2–3.9)
IgA: 63 mg/dL (ref 61–437)
IgG (Immunoglobin G), Serum: 444 mg/dL — ABNORMAL LOW (ref 603–1613)
IgM (Immunoglobulin M), Srm: 15 mg/dL — ABNORMAL LOW (ref 20–172)
Total Protein ELP: 6.4 g/dL (ref 6.0–8.5)

## 2024-01-02 ENCOUNTER — Telehealth: Payer: Self-pay | Admitting: *Deleted

## 2024-01-02 NOTE — Telephone Encounter (Signed)
 He also ssays that he will be with [facility] because he will start CAR T-cell therapy soon at [facility]

## 2024-01-07 ENCOUNTER — Telehealth: Payer: Self-pay | Admitting: *Deleted

## 2024-01-07 NOTE — Telephone Encounter (Signed)
 I got the form that he needs and it went through the transmission and it went through, pt wants a copy and it is downstairs and he will com and get it tomorrow.

## 2024-01-09 ENCOUNTER — Other Ambulatory Visit: Payer: Self-pay

## 2024-01-11 ENCOUNTER — Other Ambulatory Visit: Payer: Self-pay

## 2024-01-12 ENCOUNTER — Telehealth: Payer: Self-pay

## 2024-01-12 NOTE — Telephone Encounter (Signed)
 Diagnosis verification form completed, signed, and faxed back to [facility] @ [phone]

## 2024-01-19 ENCOUNTER — Other Ambulatory Visit: Payer: Self-pay

## 2024-01-21 ENCOUNTER — Other Ambulatory Visit: Payer: Self-pay

## 2024-01-26 ENCOUNTER — Other Ambulatory Visit: Payer: Self-pay

## 2024-01-28 ENCOUNTER — Other Ambulatory Visit: Payer: Self-pay

## 2024-01-31 ENCOUNTER — Other Ambulatory Visit: Payer: Self-pay

## 2024-02-03 ENCOUNTER — Other Ambulatory Visit: Payer: Self-pay

## 2024-02-06 ENCOUNTER — Other Ambulatory Visit: Payer: Self-pay

## 2024-02-08 ENCOUNTER — Other Ambulatory Visit: Payer: Self-pay

## 2024-02-09 ENCOUNTER — Other Ambulatory Visit: Payer: Self-pay

## 2024-02-11 ENCOUNTER — Other Ambulatory Visit: Payer: Self-pay

## 2024-02-13 ENCOUNTER — Other Ambulatory Visit: Payer: Self-pay

## 2024-02-16 ENCOUNTER — Other Ambulatory Visit: Payer: Self-pay

## 2024-02-18 ENCOUNTER — Other Ambulatory Visit: Payer: Self-pay

## 2024-02-21 ENCOUNTER — Other Ambulatory Visit: Payer: Self-pay

## 2024-02-23 ENCOUNTER — Other Ambulatory Visit: Payer: Self-pay

## 2024-02-25 ENCOUNTER — Other Ambulatory Visit: Payer: Self-pay

## 2024-02-28 ENCOUNTER — Other Ambulatory Visit: Payer: Self-pay

## 2024-03-01 ENCOUNTER — Ambulatory Visit: Payer: Self-pay

## 2024-03-01 NOTE — Telephone Encounter (Addendum)
 If he is lightheaded at all, then I would hold hydralazine .  It would be okay to hold hydralazine  until the office visit, if needed.  If he is still lightheaded in spite of holding hydralazine , then I would hold lisinopril .  Please let me know if that is not helping.  Thanks.

## 2024-03-01 NOTE — Telephone Encounter (Signed)
 Chief Complaint: Dizziness Symptoms: lightheadedness Frequency: Friday Pertinent Negatives: Patient denies see notes Disposition: [] ED /[] Urgent Care (no appt availability in office) / [x] Appointment(In office/virtual)/ []  [facility]/ [] Home Care/ [] Refused Recommended Disposition /[] [facility]/ []  Follow-up with PCP Additional Notes: Patient was hospitalized at [facility] for 35 days, for CAR t-cell therapy. Patient released on Monday (one week prior to today). Patient given unclear instructions on how to proceed with medication regimen. Patient has had mild dizziness (ruled out numbness, weakness of arms/legs/face, difficulty walking, talking). Patient was told today by Physician at [facility] today to hold Hydralazine  just for today, or until he sees PCP. Patient scheduled for Thursday with PCP.   Patient needs clarification on: Metoprolol  (Dr. Vallarie Gauze had patient on 4 pills a day, and now is on only 2 pills a day) Patient still on lisinopril  Patient still on hydralazine  (told to hold until further notice) Patient's current blood pressure reading is 138/83. Patient needs further advice on how to continue medication.  Patient follow up with [facility] this Wednesday.   Please have PCP advice call patient back at [phone].    Copied from CRM [id number]. Topic: Clinical - Medication Question >> Mar 01, 2024  9:02 AM Luis Burke wrote: Reason for CRM: dizziness  Patient was hospitalized at [facility] for 35 days for CAR t-cell therapy, he has been on Hydralazine  (Apresoline ) 10 mg tablet. PCP Luis Burke is the prescriber.  Due to low blood pressure during treatment, some BP medications were initially discontinued. After resuming Hydralazine , patient began experiencing dizziness (last BP reading: 138/83) and reports feeling weak due to low WBC and platelets. [facility] providers advised him to hold today's dose and to reach out to his PCP for further guidance on whether to continue the medication and  at what dose. Patient is also not supposed to drive for 30 days. He is seeking guidance from PCP regarding whether Hydralazine  is still appropriate. Reason for Disposition  [1] MODERATE dizziness (e.g., interferes with normal activities) AND [2] has been evaluated by doctor (or NP/PA) for this  Answer Assessment - Initial Assessment Questions 1. DESCRIPTION: "Describe your dizziness."     Lightheaded, woozy 2. LIGHTHEADED: "Do you feel lightheaded?" (e.g., somewhat faint, woozy, weak upon standing)     Yes lightheaded, not faint 4. SEVERITY: "How bad is it?"  "Do you feel like you are going to faint?" "Can you stand and walk?"   - MILD: Feels slightly dizzy, but walking normally.   - MODERATE: Feels unsteady when walking, but not falling; interferes with normal activities (e.g., school, work).   - SEVERE: Unable to walk without falling, or requires assistance to walk without falling; feels like passing out now.      Mild 5. ONSET:  "When did the dizziness begin?"     Friday 6. AGGRAVATING FACTORS: "Does anything make it worse?" (e.g., standing, change in head position)     Happens for little stints of time 7. HEART RATE: "Can you tell me your heart rate?" "How many beats in 15 seconds?"  (Note: not all patients can do this)       N/a 8. CAUSE: "What do you think is causing the dizziness?"     Patient uncertain if it is related to hydralazine  - possible overexertion 9. RECURRENT SYMPTOM: "Have you had dizziness before?" If Yes, ask: "When was the last time?" "What happened that time?"     Patient states he has but hasn't felt like this 10. OTHER SYMPTOMS: "Do you  have any other symptoms?" (e.g., fever, chest pain, vomiting, diarrhea, bleeding)       Low energy (due to WBC and platelet)  Protocols used: Dizziness - Lightheadedness-A-AH

## 2024-03-02 ENCOUNTER — Other Ambulatory Visit: Payer: Self-pay

## 2024-03-02 NOTE — Telephone Encounter (Signed)
 Called patients phone and voicemail is full. No message was left due to this.

## 2024-03-03 ENCOUNTER — Telehealth: Payer: Self-pay | Admitting: *Deleted

## 2024-03-03 ENCOUNTER — Other Ambulatory Visit: Payer: Self-pay | Admitting: Oncology

## 2024-03-03 NOTE — Telephone Encounter (Signed)
 Tonya the PA called saying if she could get in touch with Dr. Wilhelmenia Harada.  Phone number [phone].  His IgG level was 295 and they would like to have him come here and get IVIG.  Lynnie Saucier did say that she would try in the morning to call back also.

## 2024-03-03 NOTE — Telephone Encounter (Signed)
 Spoke with patient today and at the time of the call he was at [facility], He was waiting to get a shot for his white blood cell counts in addition they are trying to set him up and appointment at the [facility] for an infusion,.He states that his IgG levels were in the 200's.  He still feels lightheaded and he has been holding the hydralazine  but not the lisinopril . Tomorrow he will not take the lisinopril  and will come in for his appointment.  They did lab work at [facility] today so they should be Biochemist, clinical.

## 2024-03-03 NOTE — Telephone Encounter (Signed)
 Noted. Thanks.

## 2024-03-04 ENCOUNTER — Ambulatory Visit (INDEPENDENT_AMBULATORY_CARE_PROVIDER_SITE_OTHER): Admitting: Family Medicine

## 2024-03-04 ENCOUNTER — Encounter: Payer: Self-pay | Admitting: Family Medicine

## 2024-03-04 VITALS — BP 114/64 | HR 73 | Temp 98.5°F | Ht 68.0 in | Wt 194.6 lb

## 2024-03-04 DIAGNOSIS — I10 Essential (primary) hypertension: Secondary | ICD-10-CM

## 2024-03-04 MED ORDER — PROCHLORPERAZINE MALEATE 5 MG PO TABS: 5.0000 mg | ORAL_TABLET | Freq: Four times a day (QID) | ORAL | Status: AC | PRN

## 2024-03-04 MED ORDER — METOPROLOL SUCCINATE ER 50 MG PO TB24: ORAL_TABLET | ORAL | Status: DC

## 2024-03-04 MED ORDER — FLUCONAZOLE 200 MG PO TABS: 400.0000 mg | ORAL_TABLET | Freq: Every day | ORAL | Status: DC

## 2024-03-04 MED ORDER — HYDRALAZINE HCL 10 MG PO TABS: ORAL_TABLET | ORAL | Status: DC

## 2024-03-04 NOTE — Patient Instructions (Addendum)
 I would skip hydralazine  and lisinopril  for now.  Update me about your BP and pulse next week.  Take care.  Glad to see you.

## 2024-03-04 NOTE — Progress Notes (Unsigned)
 Status post CAR-T treatment.  Followed by [facility].   He is off hydralazine  for now.   Held lisinopril  today.   Current medication list reviewed and updated. He had RELEUKO  infusion yesterday.   He was getting lightheaded with lower BP noted.    He went to [employer] and got tomato plants, cucumbers, ferns. Cautions d/w pt.    No fevers.  Blood pressure has been 112-138 over 70s to 80s recently, with pulse in 60s to 70s usually.  Labs from [facility] clinic reviewed.  Discussed with patient.  Meds, vitals, and allergies reviewed.   ROS: Per HPI unless specifically indicated in ROS section   Nad Ncat Neck supple, no LA Rrr Ctab Abd soft.  Nontender. He had some small amount of bruising on the dorsum of the feet, not on the plantar side.

## 2024-03-04 NOTE — Telephone Encounter (Signed)
 Pt has been scheduled for IVIG on Friday 5/23. He is aware of appt details.

## 2024-03-05 ENCOUNTER — Inpatient Hospital Stay: Attending: Oncology

## 2024-03-05 VITALS — BP 125/78 | HR 79 | Temp 97.8°F | Resp 19 | Wt 195.8 lb

## 2024-03-05 DIAGNOSIS — C9000 Multiple myeloma not having achieved remission: Secondary | ICD-10-CM | POA: Insufficient documentation

## 2024-03-05 DIAGNOSIS — N179 Acute kidney failure, unspecified: Secondary | ICD-10-CM | POA: Diagnosis present

## 2024-03-05 MED ORDER — IMMUNE GLOBULIN (HUMAN) 20 GM/200ML IV SOLN
1.0000 g/kg | Freq: Once | INTRAVENOUS | Status: AC
  Administered 2024-03-05: 75 g via INTRAVENOUS
  Filled 2024-03-05: qty 750

## 2024-03-08 MED ORDER — LISINOPRIL 20 MG PO TABS: ORAL_TABLET | ORAL | Status: DC

## 2024-03-08 NOTE — Assessment & Plan Note (Signed)
 Likely has been relatively overtreated.  Discussed options. I would skip hydralazine  and lisinopril  for now.  I asked him to update me about his BP and pulse next week.  We may end up having to restart lisinopril  at a lower dose.  Had previously been on 20 mg/day.

## 2024-03-09 ENCOUNTER — Other Ambulatory Visit: Payer: Self-pay

## 2024-03-12 ENCOUNTER — Telehealth: Payer: Self-pay | Admitting: Family Medicine

## 2024-03-12 NOTE — Telephone Encounter (Signed)
 Copied from CRM [id number]. Topic: General - Other >> Mar 12, 2024  2:11 PM Jenice Mitts wrote: Reason for CRM: Patient is calling in to let Dr Vallarie Gauze know at [facility] on Wednesday his blood pressure was 138/83 and today when he took it, it was 125/79. He also wanted Dr Vallarie Gauze to know they [facility] doctor put him on hydrocortisone steroids for fatigue and light headedness. He takes it twice a day. 2 in the morning and 1 in the evening

## 2024-03-12 NOTE — Telephone Encounter (Signed)
 Patient notified to stay off the lisinopril  and to reach out to us  next week to advise of BP Readings

## 2024-03-12 NOTE — Telephone Encounter (Addendum)
 Duly noted.  I would continue as is for now.  Those BPs are reasonable.  I wouldn't restart lisinopril  yet.  Please update me as needed about his BP next week.  Thanks.

## 2024-03-14 ENCOUNTER — Other Ambulatory Visit: Payer: Self-pay

## 2024-04-03 ENCOUNTER — Other Ambulatory Visit: Payer: Self-pay

## 2024-04-06 ENCOUNTER — Telehealth: Payer: Self-pay | Admitting: Family Medicine

## 2024-04-06 NOTE — Telephone Encounter (Unsigned)
 Copied from CRM [id number]. Topic: Referral - Request for Referral >> Apr 06, 2024 11:28 AM Zenovia PARAS wrote: Did the patient discuss referral with their provider in the last year? {yes/no:20286} (If No - schedule appointment) (If Yes - send message)  Appointment offered? No, patient would like to know if he need one first  Type of order/referral and detailed reason for visit: per patientling specialist   Preference of office, provider, location: n/a  If referral order, have you been seen by this specialty before? No (If Yes, this issue or another issue? When? Where?  Can we respond through MyChart? Yes

## 2024-04-08 ENCOUNTER — Telehealth: Payer: Self-pay | Admitting: Family Medicine

## 2024-04-08 DIAGNOSIS — I10 Essential (primary) hypertension: Secondary | ICD-10-CM

## 2024-04-08 NOTE — Telephone Encounter (Signed)
 Noted. Thanks.

## 2024-04-08 NOTE — Addendum Note (Signed)
 Addended by: CLEATUS ARLYSS RAMAN on: 04/08/2024 01:59 PM   Modules accepted: Orders

## 2024-04-08 NOTE — Telephone Encounter (Signed)
 Patient is aware that the referral has been place. Sometime he snore more days than others.

## 2024-04-08 NOTE — Telephone Encounter (Signed)
 Those BP readings are reasonable for now.  I put in the referral to pulmonary.  Has he been snoring?  Please let me know.  Thanks.

## 2024-04-08 NOTE — Telephone Encounter (Addendum)
 Patients oncologist stated to touch base with Dr Cleatus regarding elevated BP his readings today were 146/94 and 137/ 93 around 8 am this morning please advise if there needs to be a change in his BP medication   Patient also asking about Referral he called in on 6/24

## 2024-04-08 NOTE — Telephone Encounter (Signed)
 Reached out to patient to obtain some clarification about the type of referral he is requesting. He states that his oncologist is thinking that he my need to see pulmonary in reference to sleep apnea and possible CPAP.  I did advise his that I will reach out to him about his BP and any recommendations.

## 2024-04-10 ENCOUNTER — Other Ambulatory Visit: Payer: Self-pay

## 2024-04-19 NOTE — Telephone Encounter (Signed)
 Please verify that he is still taking amlodipine  (2x5mg ) 10mg  per day along with metoprolol  50mg  BID.   If not, then let me know.    If so, would try adding back 1/2 tab of lisinopril .  That would be 10mg  (1/2 of 20mg  tab).  Please update me about his BP in about 1 week, sooner if needed.  Thanks.

## 2024-04-19 NOTE — Telephone Encounter (Signed)
 Copied from CRM [id number]. Topic: General - Other >> Apr 19, 2024 11:02 AM Mesmerise C wrote: Reason for CRM: Doctor at Oncology center told patient to let Dr Cleatus know patient's BP 145-150/97 and may need to get be put back on BP medications please give call back at [phone] to discuss steps moving forward

## 2024-04-19 NOTE — Telephone Encounter (Signed)
 He is taking as directed He will add the 1/2 Lisinopril  and update in a week. Will reach out sooner if any new symptoms or changes. Will let us  know if any questions.

## 2024-04-20 NOTE — Telephone Encounter (Signed)
 Noted. Thanks.

## 2024-04-22 ENCOUNTER — Ambulatory Visit: Admitting: Sleep Medicine

## 2024-04-22 ENCOUNTER — Encounter: Payer: Self-pay | Admitting: Sleep Medicine

## 2024-04-22 VITALS — BP 132/80 | HR 71 | Temp 98.0°F | Ht 67.0 in | Wt 200.8 lb

## 2024-04-22 DIAGNOSIS — G4733 Obstructive sleep apnea (adult) (pediatric): Secondary | ICD-10-CM | POA: Diagnosis not present

## 2024-04-22 DIAGNOSIS — I10 Essential (primary) hypertension: Secondary | ICD-10-CM

## 2024-04-22 NOTE — Patient Instructions (Signed)
 SABRA

## 2024-04-22 NOTE — Progress Notes (Signed)
 Name:Luis Burke MRN: [id number] DOB: 07-27-1956   CHIEF COMPLAINT:  EXCESSIVE DAYTIME SLEEPINESS   HISTORY OF PRESENT ILLNESS:  Mr. Canavan is a 68 y.o. w/ a h/o HTN, obesity and multiple myeloma who presents for c/o loud snoring and witnessed apnea which has been present for several years. Reports nocturnal awakenings due to nocturia, however does not have difficulty falling back to sleep. Reports a 10 lb weight loss. Admits to dry mouth and occasional morning headaches. Denies RLS symptoms, dream enactment, cataplexy, hypnagogic or hypnapompic hallucinations. Reports a family history of sleep apnea. Denies drowsy driving. Drinks 2 cups of coffee daily, denies alcohol, tobacco or illicit drug use.   Bedtime 10-11 pm Sleep onset 10 mins Rise time 5:30-6 am   EPWORTH SLEEP SCORE 2    04/22/2024    8:00 AM  Results of the Epworth flowsheet  Sitting and reading 0  Watching TV 1  Sitting, inactive in a public place (e.g. a theatre or a meeting) 0  As a passenger in a car for an hour without a break 0  Lying down to rest in the afternoon when circumstances permit 1  Sitting and talking to someone 0  Sitting quietly after a lunch without alcohol 0  In a car, while stopped for a few minutes in traffic 0  Total score 2     PAST MEDICAL HISTORY :   has a past medical history of AKI (acute kidney injury) (HCC) (01/06/2019), Anemia, 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HCC), Fatty liver, Foot fracture, Hyperlipidemia (03/2004), Hypertension (1994), Multiple myeloma (HCC), and Snores.  has a past surgical history that includes Left wrist ganglionectomy; L Lap herniorraphy (04/2000); Hernia repair; Cataract extraction w/PHACO (Right, 03/04/2018); Eye surgery (Right); Colonoscopy with propofol  (N/A, 07/20/2018); Esophagogastroduodenoscopy (egd) with propofol  (N/A, 07/20/2018); Cataract extraction (Left, 10/10/2021); Cataract extraction w/PHACO (Left, 10/10/2021); and IR BONE MARROW BIOPSY  & ASPIRATION (05/08/2023). Prior to Admission medications   Medication Sig Start Date End Date Taking? Authorizing Provider  acetaminophen  (TYLENOL ) 325 MG tablet Take 2 tablets (650 mg total) by mouth See admin instructions. 1 hour prior to chemotherapy treatments 06/07/21  Yes Babara Call, MD  acyclovir  (ZOVIRAX ) 400 MG tablet TAKE 1 TABLET(400 MG) BY MOUTH TWICE DAILY 11/18/23  Yes Babara Call, MD  amLODipine  (NORVASC ) 5 MG tablet Take 2 tablets (10 mg total) by mouth daily. 07/31/23  Yes Cleatus Arlyss RAMAN, MD  Calcium Magnesium  Zinc 333-133-5 MG TABS Take 1 tablet by mouth daily. 01/21/24  Yes [provider]  [facility] VITAMIN B12 1000 MCG tablet TAKE 1 TABLET BY MOUTH EVERY DAY 01/09/21  Yes Cleatus Arlyss RAMAN, MD  fluconazole  (DIFLUCAN ) 200 MG tablet Take 2 tablets (400 mg total) by mouth daily. 03/04/24  Yes Cleatus Arlyss RAMAN, MD  hydrALAZINE  (APRESOLINE ) 10 MG tablet Held as of 03/04/24 03/04/24  Yes Cleatus Arlyss RAMAN, MD  hydrocortisone (CORTEF) 10 MG tablet Take by mouth daily. 04/11/24  Yes [provider]  lisinopril  (ZESTRIL ) 20 MG tablet Held. 03/08/24  Yes Cleatus Arlyss RAMAN, MD  lisinopril  (ZESTRIL ) 5 MG tablet Take 5 mg by mouth.   Yes [provider]  loratadine  (CLARITIN ) 10 MG tablet Take 10 mg by mouth daily as needed for allergies, rhinitis or itching.   Yes [provider]  magnesium  oxide (MAG-OX) 400 MG tablet Take 400 mg by mouth daily. 02/23/24 04/23/24 Yes [provider]  metoprolol  succinate (TOPROL -XL) 50 MG 24 hr tablet TAKE 1 TAB BY MOUTH  TWICE A DAY WITH OR IMMEDIATELY FOLLOWING A MEAL 03/04/24  Yes Cleatus Arlyss RAMAN, MD  ondansetron  (ZOFRAN ) 8 MG tablet Take 1 tablet (8 mg total) by mouth every 8 (eight) hours as needed for nausea or vomiting. 07/08/23  Yes Babara Call, MD  pentamidine (PENTAM) 300 MG inhalation solution Take 300 mg by nebulization.   Yes [provider]  prochlorperazine  (COMPAZINE ) 5 MG tablet Take 1 tablet (5 mg total) by mouth  every 6 (six) hours as needed for nausea or vomiting. 03/04/24  Yes Cleatus Arlyss RAMAN, MD  zinc gluconate 50 MG tablet Take 50 mg by mouth daily.   Yes [provider]  sulfamethoxazole -trimethoprim (BACTRIM  DS) 800-160 MG tablet Take 1 tablet by mouth 3 (three) times a week. Patient not taking: Reported on 04/22/2024    [provider]   Allergies  Allergen Reactions   Bactrim  [Sulfamethoxazole -Trimethoprim]     Elevated creatinine   Clonidine  Derivatives     Low platelets, edema   Dapsone Other (See Comments)    G6PD Deficiency   Tadalafil     Intolerant,  Didn't feel well on med    FAMILY HISTORY:  family history includes Colon cancer in his father; Dementia in his mother; Diabetes in his mother; Heart disease in his father and mother; Hypertension in his father, mother, sister, sister, and sister; Prostate cancer in his father. SOCIAL HISTORY:  reports that he has never smoked. He has never used smokeless tobacco. He reports that he does not currently use alcohol after a past usage of about 3.0 standard drinks of alcohol per week. He reports that he does not use drugs.   Review of Systems:  Gen:  Denies  fever, sweats, chills weight loss  HEENT: Denies blurred vision, double vision, ear pain, eye pain, hearing loss, nose bleeds, sore throat Cardiac:  No dizziness, chest pain or heaviness, chest tightness,edema, No JVD Resp:   No cough, -sputum production, -shortness of breath,-wheezing, -hemoptysis,  Gi: Denies swallowing difficulty, stomach pain, nausea or vomiting, diarrhea, constipation, bowel incontinence Gu:  Denies bladder incontinence, burning urine Ext:   Denies Joint pain, stiffness or swelling Skin: Denies  skin rash, easy bruising or bleeding or hives Endoc:  Denies polyuria, polydipsia , polyphagia or weight change Psych:   Denies depression, insomnia or hallucinations  Other:  All other systems negative  VITAL SIGNS: BP 132/80 (BP Location: Right  Arm, Patient Position: Sitting, Cuff Size: Large)   Pulse 71   Temp 98 F (36.7 C) (Oral)   Ht 5' 7 (1.702 m)   Wt 200 lb 12.8 oz (91.1 kg)   SpO2 95%   BMI 31.45 kg/m    Physical Examination:   General Appearance: No distress  EYES PERRLA, EOM intact.   NECK Supple, No JVD Pulmonary: normal breath sounds, No wheezing.  CardiovascularNormal S1,S2.  No m/r/g.   Abdomen: Benign, Soft, non-tender. Skin:   warm, no rashes, no ecchymosis  Extremities: normal, no cyanosis, clubbing. Neuro:without focal findings,  speech normal  PSYCHIATRIC: Mood, affect within normal limits.   ASSESSMENT AND PLAN  OSA I suspect that OSA is likely present due to clinical presentation. Discussed the consequences of untreated sleep apnea. Advised not to drive drowsy for safety of patient and others. Will complete further evaluation with a home sleep study and follow up to review results.    HTN Stable, on current management. Following with PCP.    MEDICATION ADJUSTMENTS/LABS AND TESTS ORDERED: Recommend Sleep Study   Patient  satisfied with Plan of action and management. All questions answered  Follow up to review HST results and treatment plan.   I spent a total of 48 minutes reviewing chart data, face-to-face evaluation with the patient, counseling and coordination of care as detailed above.    Kinzley Savell, M.D.  Sleep Medicine [facility]

## 2024-04-23 ENCOUNTER — Other Ambulatory Visit: Payer: Self-pay

## 2024-05-03 ENCOUNTER — Other Ambulatory Visit: Payer: Self-pay

## 2024-05-04 ENCOUNTER — Telehealth: Payer: Self-pay

## 2024-05-04 NOTE — Telephone Encounter (Signed)
 Returned call to patient just to confirm his BP. He did confirm that it has been running on average 150/90. He is aware that Dr. Cleatus is out of office and will not be notified until he is back in the office. He understood

## 2024-05-04 NOTE — Telephone Encounter (Signed)
 Copied from CRM [id number]. Topic: Clinical - Medical Advice >> May 04, 2024  8:53 AM Jasmin G wrote: Reason for CRM: Pt was told by clinic to check weekly with one of Dr. Cleatus nurse's about his bp, he states that today it marked 150/mid 90's

## 2024-05-07 ENCOUNTER — Other Ambulatory Visit: Payer: Self-pay

## 2024-05-09 NOTE — Telephone Encounter (Signed)
 Please verify his current amlodipine  lisinopril  and metoprolol  doses.  We can likely adjust his lisinopril .  Please verify that he is not yet taking hydralazine .  Thanks.

## 2024-05-09 NOTE — Addendum Note (Signed)
 Addended by: CLEATUS ARLYSS RAMAN on: 05/09/2024 04:52 PM   Modules accepted: Orders

## 2024-05-10 ENCOUNTER — Other Ambulatory Visit: Payer: Self-pay

## 2024-05-10 MED ORDER — LISINOPRIL 20 MG PO TABS: 10.0000 mg | ORAL_TABLET | Freq: Every day | ORAL | Status: DC

## 2024-05-10 NOTE — Telephone Encounter (Signed)
 I wouldn't change his meds based on that most recent BP reading.    If persistently elevated later on, he could try increasing lisinopril  to a full tab per day.  Please let us  know if he has to do that.  Thanks.

## 2024-05-10 NOTE — Addendum Note (Signed)
 Addended by: CLEATUS ARLYSS RAMAN on: 05/10/2024 10:52 PM   Modules accepted: Orders

## 2024-05-10 NOTE — Telephone Encounter (Signed)
 Returned call to patient he went to [facility] today and his BP at that visit was 136/89. He is taking 2 tablets of the amlodipine . Lisinopril  he has a 20mg  tablet that he taking 1/2 a day. Metoprolol  he is taking 2 tablets 1 in the morning and one in the afternoon. He is not taking hydralazine .

## 2024-05-12 ENCOUNTER — Other Ambulatory Visit: Payer: Self-pay

## 2024-05-12 NOTE — Telephone Encounter (Signed)
 Checked with patient today to see how he is doing in terms of his BP he said its been doing good. I did let him know that if it starts to increase again to please let us  know because he may have to increase his dose of the lisinopril . He verbalized understanding.  He did states that he is being followed by [facility] about a cough he has recently developed and they are treating it, he has the flu. So he is just letting things run its course. But he was just letting you be aware of what is going on with him.  But BP is good

## 2024-05-12 NOTE — Telephone Encounter (Signed)
 Noted.  Thanks.  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

## 2024-05-19 ENCOUNTER — Other Ambulatory Visit: Payer: Self-pay

## 2024-06-25 ENCOUNTER — Telehealth: Payer: Self-pay | Admitting: *Deleted

## 2024-06-25 ENCOUNTER — Telehealth: Payer: Self-pay

## 2024-06-25 ENCOUNTER — Other Ambulatory Visit: Payer: Self-pay | Admitting: Oncology

## 2024-06-25 DIAGNOSIS — C9000 Multiple myeloma not having achieved remission: Secondary | ICD-10-CM

## 2024-06-25 NOTE — Telephone Encounter (Signed)
-----   Message from Nurse Almarie RAMAN sent at 06/25/2024  8:42 AM EDT ----- Regarding: FW: [facility] V[facility] NOT[facility]- [facility]  ----- Message ----- From: Melba Grayce MATSU Sent: 06/25/2024   8:01 AM EDT To: Almarie JINNY Nett, RN; Fonda MATSU Sax, CMA Subject: [facility]- [facility]          [facility] sent to his chart.  There is a note to DR Babara at the top of this note to order IVIG for this patient.

## 2024-06-25 NOTE — Telephone Encounter (Signed)
 Received fax from [facility] requesting for patient to get IVIG at Cancer center. Dr. Babara would like to see pt also.   Please schedule and notify pt of appt:   Lab/MD/ IVIG next week

## 2024-06-25 NOTE — Telephone Encounter (Signed)
 The nurse says that they did IgG level and it was 313 and they recommend patient come over here and get IVIG and they sent a fax with all that information that she said.  Telephone if need be [phone].

## 2024-07-01 ENCOUNTER — Inpatient Hospital Stay (HOSPITAL_BASED_OUTPATIENT_CLINIC_OR_DEPARTMENT_OTHER): Admitting: Oncology

## 2024-07-01 ENCOUNTER — Inpatient Hospital Stay: Attending: Oncology

## 2024-07-01 ENCOUNTER — Encounter: Payer: Self-pay | Admitting: Oncology

## 2024-07-01 VITALS — BP 135/90 | HR 70 | Temp 97.1°F | Resp 18 | Wt 204.3 lb

## 2024-07-01 DIAGNOSIS — D696 Thrombocytopenia, unspecified: Secondary | ICD-10-CM | POA: Diagnosis not present

## 2024-07-01 DIAGNOSIS — Z9481 Bone marrow transplant status: Secondary | ICD-10-CM | POA: Insufficient documentation

## 2024-07-01 DIAGNOSIS — R771 Abnormality of globulin: Secondary | ICD-10-CM

## 2024-07-01 DIAGNOSIS — N1831 Chronic kidney disease, stage 3a: Secondary | ICD-10-CM

## 2024-07-01 DIAGNOSIS — M545 Low back pain, unspecified: Secondary | ICD-10-CM | POA: Diagnosis not present

## 2024-07-01 DIAGNOSIS — R6883 Chills (without fever): Secondary | ICD-10-CM | POA: Diagnosis not present

## 2024-07-01 DIAGNOSIS — C9002 Multiple myeloma in relapse: Secondary | ICD-10-CM | POA: Diagnosis present

## 2024-07-01 DIAGNOSIS — R232 Flushing: Secondary | ICD-10-CM | POA: Diagnosis not present

## 2024-07-01 DIAGNOSIS — Z9484 Stem cells transplant status: Secondary | ICD-10-CM | POA: Insufficient documentation

## 2024-07-01 DIAGNOSIS — C7951 Secondary malignant neoplasm of bone: Secondary | ICD-10-CM | POA: Diagnosis not present

## 2024-07-01 DIAGNOSIS — Z8042 Family history of malignant neoplasm of prostate: Secondary | ICD-10-CM | POA: Diagnosis not present

## 2024-07-01 DIAGNOSIS — C9001 Multiple myeloma in remission: Secondary | ICD-10-CM | POA: Diagnosis not present

## 2024-07-01 DIAGNOSIS — C9000 Multiple myeloma not having achieved remission: Secondary | ICD-10-CM

## 2024-07-01 LAB — CBC WITH DIFFERENTIAL (CANCER CENTER ONLY)
Abs Immature Granulocytes: 0.01 K/uL (ref 0.00–0.07)
Basophils Absolute: 0 K/uL (ref 0.0–0.1)
Basophils Relative: 0 %
Eosinophils Absolute: 0 K/uL (ref 0.0–0.5)
Eosinophils Relative: 1 %
HCT: 34.6 % — ABNORMAL LOW (ref 39.0–52.0)
Hemoglobin: 12 g/dL — ABNORMAL LOW (ref 13.0–17.0)
Immature Granulocytes: 0 %
Lymphocytes Relative: 28 %
Lymphs Abs: 0.9 K/uL (ref 0.7–4.0)
MCH: 34.7 pg — ABNORMAL HIGH (ref 26.0–34.0)
MCHC: 34.7 g/dL (ref 30.0–36.0)
MCV: 100 fL (ref 80.0–100.0)
Monocytes Absolute: 0.3 K/uL (ref 0.1–1.0)
Monocytes Relative: 11 %
Neutro Abs: 1.8 K/uL (ref 1.7–7.7)
Neutrophils Relative %: 60 %
Platelet Count: 52 K/uL — ABNORMAL LOW (ref 150–400)
RBC: 3.46 MIL/uL — ABNORMAL LOW (ref 4.22–5.81)
RDW: 14.4 % (ref 11.5–15.5)
WBC Count: 3 K/uL — ABNORMAL LOW (ref 4.0–10.5)
nRBC: 0 % (ref 0.0–0.2)

## 2024-07-01 LAB — CMP (CANCER CENTER ONLY)
ALT: 19 U/L (ref 0–44)
AST: 20 U/L (ref 15–41)
Albumin: 4.7 g/dL (ref 3.5–5.0)
Alkaline Phosphatase: 63 U/L (ref 38–126)
Anion gap: 8 (ref 5–15)
BUN: 24 mg/dL — ABNORMAL HIGH (ref 8–23)
CO2: 23 mmol/L (ref 22–32)
Calcium: 9.5 mg/dL (ref 8.9–10.3)
Chloride: 106 mmol/L (ref 98–111)
Creatinine: 1.45 mg/dL — ABNORMAL HIGH (ref 0.61–1.24)
GFR, Estimated: 52 mL/min — ABNORMAL LOW (ref >60)
Glucose, Bld: 115 mg/dL — ABNORMAL HIGH (ref 70–99)
Potassium: 4.1 mmol/L (ref 3.5–5.1)
Sodium: 137 mmol/L (ref 135–145)
Total Bilirubin: 1 mg/dL (ref 0.0–1.2)
Total Protein: 6.6 g/dL (ref 6.5–8.1)

## 2024-07-01 NOTE — Assessment & Plan Note (Signed)
 Majority of his lucencies were not hypermetabolic on previous PET scan Xgeva  monthly last dose in Feb 2025, will resume in the future -  Continue calcium and vitamin D supplementation.

## 2024-07-01 NOTE — Assessment & Plan Note (Signed)
 Avoid nephrotoxin.  Encourage hydration.

## 2024-07-01 NOTE — Assessment & Plan Note (Signed)
 IVIG if IgG <400 he will get 1g/kg tmr.

## 2024-07-01 NOTE — Assessment & Plan Note (Signed)
 Decreased, due to recent chemo and CAR T

## 2024-07-01 NOTE — Assessment & Plan Note (Addendum)
 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   5% plasma cells 05/2021- -11/2021,  7 cycles of daratumumab /Pomalyst /dexamethasone . 11/20/2021, bone marrow biopsy which showed 1% of plasma cells.  Started Daratumumab  maintenance.  05/08/2023 bone marrow biopsy: persistent low-level plasma cell neoplasm.  06/2023: added Revlimid  to Daratumumab  / Dexamethasone , Revlimid  was poorly tolerated and was discontinued  He was maintained on Daratumumab  until March 2025  11/21/2023 T-cell collection (apheresis) for Carvykti CAR-T manufacturing  01/16/2024 S/p  Carvykti CAR-T at [facility] are reviewed and discussed with patient.  Lab Results  Component Value Date   MPROTEIN Not Observed 12/30/2023   KPAFRELGTCHN 99.6 (H) 12/30/2023   LAMBDASER 4.4 (L) 12/30/2023   KAPLAMBRATIO 22.64 (H) 12/30/2023   Continue follow up with [facility]. He is doing well clinically.  He will follow up with me monthly when the follow up interval at [facility] increases to Q3 months.  He will get immunization at [facility].   Prophylactic Bactrim  three times weekly and Aciclovir twice daily
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
## 2024-07-02 ENCOUNTER — Inpatient Hospital Stay: Admitting: Nurse Practitioner

## 2024-07-02 ENCOUNTER — Inpatient Hospital Stay

## 2024-07-02 VITALS — BP 144/75 | HR 82 | Temp 97.8°F | Resp 18

## 2024-07-02 DIAGNOSIS — Z9481 Bone marrow transplant status: Secondary | ICD-10-CM

## 2024-07-02 DIAGNOSIS — T8090XA Unspecified complication following infusion and therapeutic injection, initial encounter: Secondary | ICD-10-CM

## 2024-07-02 DIAGNOSIS — Z9221 Personal history of antineoplastic chemotherapy: Secondary | ICD-10-CM

## 2024-07-02 DIAGNOSIS — C9000 Multiple myeloma not having achieved remission: Secondary | ICD-10-CM | POA: Diagnosis not present

## 2024-07-02 DIAGNOSIS — C9002 Multiple myeloma in relapse: Secondary | ICD-10-CM | POA: Diagnosis not present

## 2024-07-02 DIAGNOSIS — N179 Acute kidney failure, unspecified: Secondary | ICD-10-CM

## 2024-07-02 DIAGNOSIS — R771 Abnormality of globulin: Secondary | ICD-10-CM

## 2024-07-02 MED ORDER — MORPHINE SULFATE (PF) 2 MG/ML IV SOLN
2.0000 mg | Freq: Once | INTRAVENOUS | Status: AC
  Administered 2024-07-02: 2 mg via INTRAVENOUS
  Filled 2024-07-02: qty 1

## 2024-07-02 MED ORDER — SODIUM CHLORIDE 0.9 % IV SOLN
Freq: Once | INTRAVENOUS | Status: DC | PRN
  Filled 2024-07-02: qty 250

## 2024-07-02 MED ORDER — DEXAMETHASONE SODIUM PHOSPHATE 10 MG/ML IJ SOLN
4.0000 mg | Freq: Once | INTRAMUSCULAR | Status: AC
  Administered 2024-07-02: 4 mg via INTRAVENOUS
  Filled 2024-07-02: qty 1

## 2024-07-02 MED ORDER — DIPHENHYDRAMINE HCL 25 MG PO CAPS
25.0000 mg | ORAL_CAPSULE | Freq: Once | ORAL | Status: AC
  Administered 2024-07-02: 25 mg via ORAL
  Filled 2024-07-02: qty 1

## 2024-07-02 MED ORDER — DIPHENHYDRAMINE HCL 50 MG/ML IJ SOLN
50.0000 mg | Freq: Once | INTRAMUSCULAR | Status: AC | PRN
  Administered 2024-07-02: 25 mg via INTRAVENOUS

## 2024-07-02 MED ORDER — DEXTROSE 5 % IV SOLN
INTRAVENOUS | Status: DC
  Filled 2024-07-02: qty 250

## 2024-07-02 MED ORDER — IMMUNE GLOBULIN (HUMAN) 20 GM/200ML IV SOLN
1.0000 g/kg | INTRAVENOUS | Status: DC
  Administered 2024-07-02: 95 g via INTRAVENOUS
  Filled 2024-07-02: qty 950

## 2024-07-02 MED ORDER — ACETAMINOPHEN 325 MG PO TABS
650.0000 mg | ORAL_TABLET | Freq: Once | ORAL | Status: AC
  Administered 2024-07-02: 650 mg via ORAL
  Filled 2024-07-02: qty 2

## 2024-07-02 NOTE — Progress Notes (Signed)
 Symptom Management Clinic  [facility] at [facility] A Department of the [place]. [facility] [address] [place], [place] [address] [phone] (phone) [phone] (fax)  Patient Care Team: Cleatus Arlyss RAMAN, MD as PCP - General (Family Medicine) Babara Call, MD as Consulting Physician (Oncology) Robinette Curtistine Honey, MD as Referring Physician (Hematology and Oncology) Rojelio Clear, MD as Referring Physician (Hematology and Oncology)   Name of the patient: Luis Burke  [id number]  06-23-1956   Date of visit: 07/02/24  Diagnosis- Multiple Myeloma  Chief complaint/ Reason for visit- infusion reaction  Heme/Onc history:  Oncology History  Multiple myeloma (HCC)  08/19/2018 Bone Marrow Biopsy   08/19/2018 bone marrow biopsy showed hypercellular marrow 80%, involved by plasma cell neoplasm up to 95%.  Consistent with plasma cell myeloma. Myeloma FISH  gain of gain of CEP12   08/25/2018 Imaging   PET scan showed no definite hypermetabolic bone disease but CT findings are highly suspicious for numerous small myelomatous lesions involving the spine, sternum and scattered ribs.   08/26/2018 Initial Diagnosis   Multiple myeloma  # 07/27/2018 multiple myeloma panel showed M protein of 0.1, IgG 662, IgA 49, IgM 7. 08/10/2018, free light chain ratio showed extremely high level of kappa free light chain 10,183, with a kappa lambda light chain ratio of 1414.31,LDH 164, Beta-2  microglobulin 5     08/28/2018 - 03/17/2019 Chemotherapy   RVD x 9     04/23/2019 Bone Marrow Transplant   autologous stem cell bone marrow transplant at [facility].  He received preparative regimen with melphalan 200 mg/m on 04/22/2019 followed by autologous stem cell infusion on 04/23/2019. Transplant course was complicated with febrile neutropenia grade 3, treated with vancomycin  and cephapirin until engraftment on 05/06/2019. Patient also had engraftment syndrome and had to be  started on Solu-Medrol  25 mg twice daily on 05/05/2019.  Transition to Medrol  Dosepak on 05/07/2019 to complete steroid taper as outpatient.  re-vaccination 12 months post transplant   08/10/2019 - 05/2022 Chemotherapy    Ixazomib 3mg  on D1/D8/D15 out of 28 day cycle       07/12/2020 Imaging   CT skeletal survey done at [facility] 1.  Lucent lesion in the L4 spinous process measuring 4 mm which is  nonspecific. Consider confirmation of marrow replacing process with MRI.  2.  Incompletely characterized renal cysts    07/28/2020 Bone Marrow Biopsy    bone marrow normocellular marrow with polytypic plasmacytosis, 3 to 8%   Patient is in remission.  Cytogenetics negative for trisomy 12.  Negative myeloma FISH panel.   08/05/2020 Imaging   MRI lumbar spine with and without contrast was done at [facility] showed no evidence of spinal metastasis.  Lumbar spondylosis is most pronounced at L5-S1 where there is severe right and moderate left neural foraminal stenosis. 08/11/2020, x-ray cervical spine complete with flexion and extension showed The cervical spine is visualized from C1 to the top of T1 on the lateral  view.  Reversal of the normal cervical lordosis with a focal kyphosis centered at the C5-C6 intervertebral level. Trace anterolisthesis of C4 onto C5. Trace retrolisthesis of C6 on C7. No evidence of dynamic instability is noted on  the flexion or extension views.   No prevertebral soft tissue swelling. Cervical vertebral body heights are maintained. Multilevel degenerative changes of the cervical spine including discogenic disease, most pronounced at C5-C6 and C6-C7, and facet arthropathy most pronounced at C4-C5. Multilevel mild to moderate left neural foraminal osseous  narrowing of the mid to lower cervical vertebrae, possibly centrally/artifactual by patient positioning.     05/15/2021 Bone Marrow Biopsy   Bone marrow biopsy showed slightly hypercellular bone marrow for age with slight  plasmacytosis.  5% plasma cells with kappa light chain restriction.  Cytogenetics normal.  Myeloma FISH negative I discussed with [facility] Dr.Choi. we both feel that his bone marrow biopsy result reflects early relapse. Recommend stop Ixazomib. Switch to second line treatment with Daratumumab  Pomlyst Dexamethasone    05/31/2021 - 05/20/2022 Chemotherapy   Patient is on Treatment Plan : MYELOMA Daratumumab  + Pomalidomide  + Dexamethasone  q28d x 7 cycles     05/31/2021 - 12/30/2023 Chemotherapy   Patient is on Treatment Plan : MYELOMA Daratumumab  IV + Pomalidomide  + Dexamethasone  q28d x 7 cycles     09/21/2021 Imaging    PET scan showed no evidence of suspicious or new hypermetabolism, scattered small lucent foci seen previously in the sternum and the spine are less prominent today and have resolved in some regions.  Aortic atherosclerosis.   11/18/2021 Imaging   MRI cervical spine with and without contrast showed no multiple myeloma lesion or marrow edema identified in the cervical spine.  Abnormal dural thickening and a/or abnormal epidural space at C4/C5 levels.  This is nonspecific.  ER physician discussed the case with neurosurgery Dr. Katrina who feels that this is more likely degenerative disc disease.   11/20/2021 Bone Marrow Biopsy   bone marrow biopsy which showed 1% of plasma cells. The plasma cells generally display polyclonal staining pattern for kappa and lambda light chains although a few very small clusters appear kappa light chain restricted.  The findings are very limited but most suggestive of minimal residual plasma cell neoplasm especially in the presence of monoclonal protein with kappa light chain specificity. Normal cytogenetics and myeloma FISH   12/20/2021 -  Chemotherapy   Daratumumab  monthly maintenance  # I discussed with patient's [facility] oncologist Dr. Rojelio.  We have agreed upon de-escalating his regimen to Daratumumab  monthly maintenance for now. Discontinue pomalyst .      05/08/2023 Bone Marrow Biopsy   Bone marrow biopsy showed  Hypercellular bone marrow with trilineage hematopoiesis and 1% plasma cells  The bone marrow is hypercellular for age with trilineage hematopoiesis including abundant megakaryocytes some of which display atypical morphology.  I suspect that the latter changes are secondary in nature in this setting.  The plasma cells represent 1% of all cells including focally atypical large forms but with lack of large clusters or sheets. The atypical plasma cells display kappa light chain predominance/restriction most suggestive of minimal residual plasma cell neoplasm.  cytogenetics showed Y chromosome loss, negative myeloma FISH   06/2023 - 12/30/2023 Chemotherapy   September 2024: added Revlimid  to Daratumumab  / Dexamethasone , making it Daratumumab  / Revlimid  / Dex regimen. However, Revlimid  was poorly tolerated -> discontinued Revlimid  and he was continued on Daratumumab  maintenance until 12/30/2023   11/21/2023 Procedure   At [facility] he got CAR-T therapy 11/21/2023: T-cell collection for [employer] CAR-T manufacturing 01/21/2024 - 01/23/2024: LD chemo 01/26/2024: CAR-T infusion   Metastasis to bone (HCC)    Interval history-called to infusion for patient reports of low back pain while receiving IVIG.  He was receiving at his goal rate.  Suddenly felt chilled and worsening low back pain.  No nausea or vomiting.  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   Fatty liver     on u/s 07/2018   Foot fracture    Hyperlipidemia 03/2004   Hypertension 1994   Multiple myeloma (HCC)    Snores     Past Surgical History:  Procedure Laterality Date   CATARACT EXTRACTION Left 10/10/2021   CATARACT EXTRACTION W/PHACO Right 03/04/2018   Procedure: CATARACT EXTRACTION PHACO AND INTRAOCULAR LENS PLACEMENT (IOC)  RIGHT TORIC;  Surgeon: Mittie Gaskin, MD;  Location: [facility];  Service: Ophthalmology;  Laterality: Right;  Per [place] no Toric Lens 1:45 5.3   CATARACT EXTRACTION W/PHACO Left 10/10/2021   Procedure: CATARACT EXTRACTION PHACO AND INTRAOCULAR LENS PLACEMENT (IOC) LEFT;  Surgeon: Mittie Gaskin, MD;  Location: [facility];  Service: Ophthalmology;  Laterality: Left;  5.16 1:03.8   COLONOSCOPY WITH PROPOFOL  N/A 07/20/2018   Procedure: COLONOSCOPY WITH PROPOFOL ;  Surgeon: Therisa Bi, MD;  Location: [facility];  Service: Gastroenterology;  Laterality: N/A;   ESOPHAGOGASTRODUODENOSCOPY (EGD) WITH PROPOFOL  N/A 07/20/2018   Procedure: ESOPHAGOGASTRODUODENOSCOPY (EGD) WITH PROPOFOL ;  Surgeon: Therisa Bi, MD;  Location: [facility] END[facility]COPY;  Service: Gastroenterology;  Laterality: N/A;   EYE SURGERY Right    HERNIA REPAIR     IR BONE MARROW BIOPSY & ASPIRATION  05/08/2023   L Lap herniorraphy  04/2000   Left wrist ganglionectomy      Current Outpatient Medications:    acetaminophen  (TYLENOL ) 325 MG tablet, Take 2 tablets (650 mg total) by mouth See admin instructions. 1 hour prior to chemotherapy treatments, Disp: 30 tablet, Rfl: 0   acyclovir  (ZOVIRAX ) 400 MG tablet, TAKE 1 TABLET(400 MG) BY MOUTH TWICE DAILY, Disp: 60 tablet, Rfl: 2   amLODipine  (NORVASC ) 5 MG tablet, Take 2 tablets (10 mg total) by mouth daily., Disp: 180 tablet, Rfl: 3   Calcium Magnesium  Zinc 333-133-5 MG TABS, Take 1 tablet by mouth daily., Disp: , Rfl:    [employer] VITAMIN B12 1000 MCG tablet, TAKE 1 TABLET BY MOUTH EVERY DAY, Disp: 90 tablet, Rfl: 3   fluconazole   (DIFLUCAN ) 200 MG tablet, Take 2 tablets (400 mg total) by mouth daily., Disp: , Rfl:    hydrALAZINE  (APRESOLINE ) 10 MG tablet, Held as of 03/04/24, Disp: , Rfl:    hydrocortisone (CORTEF) 10 MG tablet, Take by mouth daily., Disp: , Rfl:    lisinopril  (ZESTRIL ) 20 MG tablet, Take 0.5 tablets (10 mg total) by mouth daily., Disp: , Rfl:    loratadine  (CLARITIN ) 10 MG tablet, Take 10 mg by mouth daily as needed for allergies, rhinitis or itching., Disp: , Rfl:    metoprolol  succinate (TOPROL -XL) 50 MG 24 hr tablet, TAKE 1 TAB BY MOUTH TWICE A DAY WITH OR IMMEDIATELY FOLLOWING A MEAL, Disp: , Rfl:    ondansetron  (ZOFRAN ) 8 MG tablet, Take 1 tablet (8 mg total) by mouth every 8 (eight) hours as needed for nausea or vomiting., Disp: 30 tablet, Rfl: 1   pentamidine (PENTAM) 300 MG inhalation solution, Take 300 mg by nebulization., Disp: , Rfl:    prochlorperazine  (COMPAZINE ) 5 MG tablet, Take 1 tablet (5  mg total) by mouth every 6 (six) hours as needed for nausea or vomiting., Disp: , Rfl:    sulfamethoxazole -trimethoprim (BACTRIM  DS) 800-160 MG tablet, Take 1 tablet by mouth 3 (three) times a week. (Patient not taking: Reported on 07/01/2024), Disp: , Rfl:    zinc gluconate 50 MG tablet, Take 50 mg by mouth daily., Disp: , Rfl:  No current facility-administered medications for this visit.  Facility-Administered Medications Ordered in Other Visits:    0.9 %  sodium chloride  infusion, , Intravenous, Once PRN, Babara Call, MD, Last Rate: 999 mL/hr at 07/02/24 1032, New Bag at 07/02/24 1032   dextrose  5 % solution, , Intravenous, Continuous, Babara Call, MD, Last Rate: 10 mL/hr at 07/02/24 0818, New Bag at 07/02/24 0818   Immune Globulin  10% (PRIVIGEN ) IV infusion 95 g, 1 g/kg, Intravenous, Q30 days, Babara Call, MD, Last Rate: 222 mL/hr at 07/02/24 1145, Rate Change at 07/02/24 1145  Physical exam: There were no vitals filed for this visit. Physical Exam Vitals reviewed.  Constitutional:      Comments: Patient  sitting in recliner in infusion suite.  IVIG has been stopped.  Holding his low back and appears uncomfortable.  Flushed.  Cardiovascular:     Rate and Rhythm: Normal rate and regular rhythm.  Pulmonary:     Effort: No respiratory distress.     Breath sounds: No wheezing.  Abdominal:     General: There is no distension.     Tenderness: There is no abdominal tenderness.  Neurological:     Mental Status: He is alert and oriented to person, place, and time.     Assessment and plan- Patient is a 68 y.o. male diagnosed with multiple myeloma, currently receiving IVIG.  Called to infusion suite for possible reaction with low back pain, chills, flushing. New reaction for patient and he has previously received IVIG well in the past.  He received oral Benadryl  and Tylenol  as a premed.  Proceed with 25 mg IV Benadryl  and 2 mg IV morphine  for pain.  Also received 4 mg IV Decadron  for pain.  We held IVIG and gave IV fluids.  Symptoms gradually improved and patient was more comfortable.  Nursing monitored for 15 to 20 minutes and patient returned to baseline.  I recommended restarting IVIG at 50% of his previous rate.  Tolerated well and we were able to resume his goal rate.  No changes to future meds for now.   Visit Diagnosis 1. Infusion reaction, initial encounter     Patient expressed understanding and was in agreement with this plan. He also understands that He can call clinic at any time with any questions, concerns, or complaints.    Thank you for allowing me to participate in the care of this very pleasant patient.   Tinnie Dawn, DNP, AGNP-C, AOCNP [facility] [phone]  CC: Dr Babara

## 2024-07-02 NOTE — Progress Notes (Signed)
 1031: Pt reports lower back pain and feeling cold. Mild rigors noted. IVIG paused, Tinnie Dawn NP aware, NS started to gravity.  1034: Tinnie Dawn NP at chairside, pt is noticeable pain, facial flushing noted, pt reports lower back pain like I have never experienced before .  Per Tinnie Dawn NP: 25 mg IV Benadryl  given  2 mg IV Morphine  given  4 mg IV Decadron  given  1045: Pt reports chills have resolved and back pain improving. Per Tinnie Dawn NP monitor pt for additional 15-20 minutes. 1108: pain continues to improves, pt rating lower back pain a 2 out of 10 and B/P 166/92. Per Tinnie Dawn NP restart IVIG at 120 mg/kg/hr (rate 111 ml/hr) and continue to monitor.  1145: pt reports all symptoms resolved, B/P 136/73. Per Tinnie Dawn NP okay to increase rate to 240 mg/kg/hr (296ml/hr)  1555: Pt tolerated remainder of infusion well. No complaints or s/s of distress at this time.  Pt educated to call clinic with any concerns. Pt verbalizes understanding and stable at discharge.

## 2024-07-05 LAB — MULTIPLE MYELOMA PANEL, SERUM
Albumin SerPl Elph-Mcnc: 3.9 g/dL (ref 2.9–4.4)
Albumin/Glob SerPl: 1.8 — ABNORMAL HIGH (ref 0.7–1.7)
Alpha 1: 0.3 g/dL (ref 0.0–0.4)
Alpha2 Glob SerPl Elph-Mcnc: 0.7 g/dL (ref 0.4–1.0)
B-Globulin SerPl Elph-Mcnc: 1 g/dL (ref 0.7–1.3)
Gamma Glob SerPl Elph-Mcnc: 0.3 g/dL — ABNORMAL LOW (ref 0.4–1.8)
Globulin, Total: 2.2 g/dL (ref 2.2–3.9)
IgA: <5 mg/dL — ABNORMAL LOW (ref 61–437)
IgG (Immunoglobin G), Serum: 312 mg/dL — ABNORMAL LOW (ref 603–1613)
IgM (Immunoglobulin M), Srm: 13 mg/dL — ABNORMAL LOW (ref 20–172)
Total Protein ELP: 6.1 g/dL (ref 6.0–8.5)

## 2024-07-05 LAB — KAPPA/LAMBDA LIGHT CHAINS
Kappa free light chain: 0.8 mg/L — ABNORMAL LOW (ref 3.3–19.4)
Kappa, lambda light chain ratio: >0.53 (ref 0.26–1.65)
Lambda free light chains: <1.5 mg/L — ABNORMAL LOW (ref 5.7–26.3)

## 2024-07-17 ENCOUNTER — Other Ambulatory Visit: Payer: Self-pay | Admitting: Family Medicine

## 2024-07-17 DIAGNOSIS — I10 Essential (primary) hypertension: Secondary | ICD-10-CM

## 2024-07-19 NOTE — Telephone Encounter (Signed)
 I called and spoke with patient to be scheduled.

## 2024-07-19 NOTE — Telephone Encounter (Signed)
 E-scribed refill.  Pls schedule annual exam and fasting labs, after 08/04/24, for additional refills.

## 2024-07-25 ENCOUNTER — Other Ambulatory Visit: Payer: Self-pay | Admitting: Family Medicine

## 2024-07-25 DIAGNOSIS — I10 Essential (primary) hypertension: Secondary | ICD-10-CM

## 2024-07-29 ENCOUNTER — Other Ambulatory Visit

## 2024-07-30 ENCOUNTER — Other Ambulatory Visit (INDEPENDENT_AMBULATORY_CARE_PROVIDER_SITE_OTHER)

## 2024-07-30 DIAGNOSIS — I10 Essential (primary) hypertension: Secondary | ICD-10-CM

## 2024-07-30 LAB — LIPID PANEL
Cholesterol: 159 mg/dL (ref 0–200)
HDL: 33.6 mg/dL — ABNORMAL LOW (ref >39.00)
LDL Cholesterol: 98 mg/dL (ref 0–99)
NonHDL: 125.85
Total CHOL/HDL Ratio: 5
Triglycerides: 138 mg/dL (ref 0.0–149.0)
VLDL: 27.6 mg/dL (ref 0.0–40.0)

## 2024-08-01 ENCOUNTER — Ambulatory Visit: Payer: Self-pay | Admitting: Family Medicine

## 2024-08-04 ENCOUNTER — Ambulatory Visit

## 2024-08-04 VITALS — BP 130/82 | Ht 67.0 in | Wt 204.4 lb

## 2024-08-04 DIAGNOSIS — Z0000 Encounter for general adult medical examination without abnormal findings: Secondary | ICD-10-CM

## 2024-08-04 NOTE — Patient Instructions (Signed)
 Mr. Luis Burke,  Thank you for taking the time for your Medicare Wellness Visit. I appreciate your continued commitment to your health goals. Please review the care plan we discussed, and feel free to reach out if I can assist you further.  Medicare recommends these wellness visits once per year to help you and your care team stay ahead of potential health issues. These visits are designed to focus on prevention, allowing your provider to concentrate on managing your acute and chronic conditions during your regular appointments.  Please note that Annual Wellness Visits do not include a physical exam. Some assessments may be limited, especially if the visit was conducted virtually. If needed, we may recommend a separate in-person follow-up with your provider.  Ongoing Care Seeing your primary care provider every 3 to 6 months helps us  monitor your health and provide consistent, personalized care.   Referrals If a referral was made during today's visit and you haven't received any updates within two weeks, please contact the referred provider directly to check on the status.  Recommended Screenings:  Health Maintenance  Topic Date Due   Colon Cancer Screening  07/21/2023   Flu Shot  05/14/2024   COVID-19 Vaccine (4 - 2025-26 season) 06/14/2024   Medicare Annual Wellness Visit  08/04/2025   DTaP/Tdap/Td vaccine (7 - Td or Tdap) 11/01/2030   Pneumococcal Vaccine for age over 37  Completed   Hepatitis C Screening  Completed   Zoster (Shingles) Vaccine  Completed   Meningitis B Vaccine  Aged Out   Hepatitis B Vaccine  Discontinued       12/30/2023    9:50 AM  Advanced Directives  Does Patient Have a Medical Advance Directive? Yes  Type of Estate agent of [place];Living will  Does patient want to make changes to medical advance directive? No - Patient declined  Copy of Healthcare Power of Attorney in Chart? Yes - validated most recent copy scanned in chart (See row  information)  Would patient like information on creating a medical advance directive? No - Patient declined   Advance Care Planning is important because it: Ensures you receive medical care that aligns with your values, goals, and preferences. Provides guidance to your family and loved ones, reducing the emotional burden of decision-making during critical moments.  Vision: Annual vision screenings are recommended for early detection of glaucoma, cataracts, and diabetic retinopathy. These exams can also reveal signs of chronic conditions such as diabetes and high blood pressure.  Dental: Annual dental screenings help detect early signs of oral cancer, gum disease, and other conditions linked to overall health, including heart disease and diabetes.

## 2024-08-04 NOTE — Progress Notes (Signed)
 Please attest and cosign this visit due to patients primary care provider not being in the office at the time the visit was completed.    Subjective:   CAYMAN KIELBASA is a 68 y.o. who presents for a Medicare Wellness preventive visit.  As a reminder, Annual Wellness Visits don't include a physical exam, and some assessments may be limited, especially if this visit is performed virtually. We may recommend an in-person follow-up visit with your provider if needed.  Visit Complete: In person  Persons Participating in Visit: Patient.  AWV Questionnaire: No: Patient Medicare AWV questionnaire was not completed prior to this visit.  Cardiac Risk Factors include: advanced age (>40men, >54 women);obesity (BMI >30kg/m2);male gender;hypertension;dyslipidemia;sedentary lifestyle     Objective:    Today's Vitals   08/04/24 1024  BP: 130/82  Weight: 204 lb 6.4 oz (92.7 kg)  Height: 5' 7 (1.702 m)   Body mass index is 32.01 kg/m.     08/04/2024   10:53 AM 12/30/2023    9:50 AM 10/20/2023   11:31 AM 10/13/2023   10:18 AM 10/13/2023    9:14 AM 09/15/2023    2:51 PM 08/18/2023    8:45 AM  Advanced Directives  Does Patient Have a Medical Advance Directive? Yes Yes Yes Yes Yes Yes Yes  Type of Estate agent of [place];Living will Healthcare Power of [place];Living will Living will;Healthcare Power of [employer] Power of [place];Living will Healthcare Power of [place];Living will Healthcare Power of [place];Living will Living will;Healthcare Power of Attorney  Does patient want to make changes to medical advance directive?  No - Patient declined Yes (ED - Information included in AVS) No - Patient declined  No - Patient declined   Copy of Healthcare Power of Attorney in Chart? Yes - validated most recent copy scanned in chart (See row information) Yes - validated most recent copy scanned in chart (See row information)  Yes - validated most recent copy scanned  in chart (See row information) Yes - validated most recent copy scanned in chart (See row information)  Yes - validated most recent copy scanned in chart (See row information)  Would patient like information on creating a medical advance directive?  No - Patient declined Yes (ED - Information included in AVS) No - Patient declined No - Patient declined No - Patient declined Yes (ED - Information included in AVS)    Current Medications (verified) Outpatient Encounter Medications as of 08/04/2024  Medication Sig   acetaminophen  (TYLENOL ) 325 MG tablet Take 2 tablets (650 mg total) by mouth See admin instructions. 1 hour prior to chemotherapy treatments   acyclovir  (ZOVIRAX ) 400 MG tablet TAKE 1 TABLET(400 MG) BY MOUTH TWICE DAILY   amLODipine  (NORVASC ) 5 MG tablet TAKE 2 TABLETS(10 MG) BY MOUTH DAILY   Calcium Magnesium  Zinc 333-133-5 MG TABS Take 1 tablet by mouth daily.   [facility] VITAMIN B12 1000 MCG tablet TAKE 1 TABLET BY MOUTH EVERY DAY   fluconazole  (DIFLUCAN ) 200 MG tablet Take 2 tablets (400 mg total) by mouth daily.   hydrALAZINE  (APRESOLINE ) 10 MG tablet Held as of 03/04/24   hydrocortisone (CORTEF) 10 MG tablet Take by mouth daily.   lisinopril  (ZESTRIL ) 20 MG tablet Take 0.5 tablets (10 mg total) by mouth daily.   loratadine  (CLARITIN ) 10 MG tablet Take 10 mg by mouth daily as needed for allergies, rhinitis or itching.   metoprolol  succinate (TOPROL -XL) 50 MG 24 hr tablet TAKE 1 TAB BY MOUTH TWICE A DAY WITH  OR IMMEDIATELY FOLLOWING A MEAL   ondansetron  (ZOFRAN ) 8 MG tablet Take 1 tablet (8 mg total) by mouth every 8 (eight) hours as needed for nausea or vomiting.   pentamidine (PENTAM) 300 MG inhalation solution Take 300 mg by nebulization.   prochlorperazine  (COMPAZINE ) 5 MG tablet Take 1 tablet (5 mg total) by mouth every 6 (six) hours as needed for nausea or vomiting.   zinc gluconate 50 MG tablet Take 50 mg by mouth daily.   sulfamethoxazole -trimethoprim (BACTRIM  DS) 800-160 MG  tablet Take 1 tablet by mouth 3 (three) times a week. (Patient not taking: Reported on 08/04/2024)   No facility-administered encounter medications on file as of 08/04/2024.    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   Fatty liver    on u/s 07/2018   Foot fracture    Hyperlipidemia 03/2004   Hypertension 1994   Multiple myeloma (HCC)    Snores    Past Surgical History:  Procedure Laterality Date   CATARACT EXTRACTION Left 10/10/2021   CATARACT EXTRACTION W/PHACO Right 03/04/2018   Procedure: CATARACT EXTRACTION PHACO AND INTRAOCULAR LENS PLACEMENT (IOC)  RIGHT TORIC;  Surgeon: Mittie Gaskin, MD;  Location: [facility];  Service: Ophthalmology;  Laterality: Right;  [place] no Toric Lens 1:45 5.3   CATARACT EXTRACTION W/PHACO Left 10/10/2021   Procedure: CATARACT EXTRACTION PHACO AND INTRAOCULAR LENS PLACEMENT (IOC) LEFT;  Surgeon: Mittie Gaskin, MD;  Location: [facility];  Service: Ophthalmology;  Laterality: Left;  5.16 1:03.8   COLONOSCOPY WITH PROPOFOL  N/A 07/20/2018   Procedure: COLONOSCOPY WITH PROPOFOL ;  Surgeon: Therisa Bi, MD;  Location: [facility];  Service: Gastroenterology;  Laterality: N/A;   ESOPHAGOGASTRODUODENOSCOPY (EGD) WITH PROPOFOL  N/A 07/20/2018   Procedure: ESOPHAGOGASTRODUODENOSCOPY (EGD) WITH PROPOFOL ;  Surgeon: Therisa Bi, MD;  Location: [facility];  Service: Gastroenterology;  Laterality: N/A;   EYE SURGERY Right    HERNIA REPAIR     IR BONE MARROW BIOPSY & ASPIRATION  05/08/2023   L Lap herniorraphy  04/2000   Left wrist ganglionectomy     Family History  Problem Relation Age of Onset   Hypertension Mother    Diabetes Mother    Heart disease  Mother        CAD   Dementia Mother    Prostate cancer Father    Hypertension Father    Heart disease Father        MI 02/03   Colon cancer Father    Hypertension Sister    Hypertension Sister    Hypertension Sister    Social History   Socioeconomic History   Marital status: Married    Spouse name: Not on file   Number of children: 1   Years of education: Not on file   Highest education level: Not on file  Occupational History   Occupation: [employer]  Tobacco Use   Smoking status: Never   Smokeless tobacco: Never   Tobacco comments:    Occassionally  Vaping Use   Vaping status: Never Used  Substance and Sexual Activity   Alcohol use: Not Currently    Alcohol/week: 3.0 standard drinks of alcohol    Types: 3 Cans of beer per week   Drug use: No   Sexual activity: Not on file  Other Topics Concern   Not on file  Social History Narrative   Divorced 2009, married Jun 22, 2021.     His daughter died 4 days after giving birth to the patient's granddaughter   Has joint custody of his deceased daughter's child   Worked at [employer] is [place], retired 2020.     Social Drivers of Corporate investment banker Strain: Low Risk  (08/04/2024)   Overall Financial Resource Strain (CARDIA)    Difficulty of Paying Living Expenses: Not hard at all  Food Insecurity: No Food Insecurity (08/04/2024)   Hunger Vital Sign    Worried About Running Out of Food in the Last Year: Never true    Ran Out of Food in the Last Year: Never true  Transportation Needs: No Transportation Needs (08/04/2024)   PRAPARE - Administrator, Civil Service (Medical): No    Lack of Transportation (Non-Medical): No  Physical Activity: Inactive (08/04/2024)   Exercise Vital Sign    Days of Exercise per Week: 0 days    Minutes of Exercise per Session: 0 min  Stress: No Stress Concern Present (08/04/2024)   [employer] of Occupational Health - Occupational Stress Questionnaire     Feeling of Stress: Not at all  Social Connections: Moderately Integrated (08/04/2024)   Social Connection and Isolation Panel    Frequency of Communication with Friends and Family: More than three times a week    Frequency of Social Gatherings with Friends and Family: More than three times a week    Attends Religious Services: More than 4 times per year    Active Member of [employer] or Organizations: No    Attends Engineer, structural: Never    Marital Status: Married    Tobacco Counseling Counseling given: Not Answered Tobacco comments: Occassionally    Clinical Intake:  Pre-visit preparation completed: Yes  Pain : No/denies pain     BMI - recorded: 32.01 Nutritional Status: BMI > 30  Obese Nutritional Risks: None Diabetes: No  No results found for: HGBA1C   How often do you need to have someone help you when you read instructions, pamphlets, or other written materials from your doctor or pharmacy?: 1 - Never  Interpreter Needed?: No  Comments: lives with wife Information entered by :: B.Tamaj Jurgens,LPN   Activities of Daily Living     08/04/2024   10:56 AM  In your present state of health, do you have any difficulty performing the following activities:  Hearing? 0  Vision? 0  Difficulty concentrating or making decisions? 0  Walking or climbing stairs? 0  Dressing or bathing? 0  Doing errands, shopping? 0  Preparing Food and eating ? N  Using the Toilet? N  In the past six months, have you accidently leaked urine? N  Do you have problems with loss of bowel control? N  Managing your Medications? N  Managing your Finances? N  Housekeeping or managing your Housekeeping? N    Patient Care Team: Cleatus Arlyss RAMAN, MD as PCP - General (Family Medicine) Babara Call, MD as Consulting Physician (Oncology) Robinette Curtistine Honey, MD as Referring Physician (Hematology and Oncology) Rojelio Clear, MD as Referring Physician (Hematology and Oncology) Pa, [facility] (Optometry)  I have updated your Care Teams any recent Medical Services you may have received from other providers in the past year.     Assessment:   This is a routine wellness examination for Stavros.  Hearing/Vision screen Hearing Screening - Comments:: Patient  denies any hearing difficulties.   Vision Screening - Comments:: Pt says their vision is good with glasses [facility]   Goals Addressed             This Visit's Progress    Patient Stated       Continue to manage my health     Patient Stated       Drink more water 54-60oz       Depression Screen     08/04/2024   11:10 AM 03/04/2024   12:05 PM 06/12/2023    9:30 AM 07/25/2022   10:40 AM 07/23/2021    3:38 PM 06/09/2018    9:57 AM  PHQ 2/9 Scores  PHQ - 2 Score 0 4 0 0 0 0  PHQ- 9 Score  4 2  0     Fall Risk     08/04/2024   10:33 AM 03/04/2024   12:05 PM 06/12/2023    9:30 AM 07/25/2022   10:41 AM 07/23/2021    3:38 PM  Fall Risk   Falls in the past year? 0 0 0 0 0  Number falls in past yr: 0 0 0 0 0  Injury with Fall? 0 0 0 0 0  Risk for fall due to : No Fall Risks No Fall Risks No Fall Risks No Fall Risks No Fall Risks  Follow up Falls prevention discussed;Education provided Falls evaluation completed Falls evaluation completed Falls evaluation completed  Falls evaluation completed      Data saved with a previous flowsheet row definition    MEDICARE RISK AT HOME:  Medicare Risk at Home Any stairs in or around the home?: Yes If so, are there any without handrails?: Yes Home free of loose throw rugs in walkways, pet beds, electrical cords, etc?: Yes Adequate lighting in your home to reduce risk of falls?: Yes Life alert?: No Use of a cane, walker or w/c?: No Grab bars in the bathroom?: Yes Shower chair or bench in shower?: Yes Elevated toilet seat or a handicapped toilet?: No  TIMED UP AND GO:  Was the test performed?  Yes  Length of time to ambulate 10 feet: 10 sec Gait steady  and fast without use of assistive device  Cognitive Function: 6CIT completed        08/04/2024   11:00 AM  6CIT Screen  What Year? 0 points  What month? 0 points  What time? 0 points  Count back from 20 0 points  Months in reverse 0 points  Repeat phrase 0 points  Total Score 0 points    Immunizations Immunization History  Administered Date(s) Administered   DTaP / HiB / IPV 03/29/2020, 06/07/2020, 11/01/2020   Fluad Quad(high Dose 65+) 07/25/2022   Fluad Trivalent(High Dose 65+) 07/21/2023   Hepatitis B, Dialysis 03/29/2020, 06/07/2020, 11/01/2020   INFLUENZA, HIGH DOSE SEASONAL PF 08/10/2021   Influenza,inj,Quad PF,6+ Mos 09/29/2014, 11/23/2015, 08/03/2019   MMR 05/02/2021   Moderna Sars-Covid-2 Vaccination 11/24/2019, 12/22/2019, 07/08/2020   Pneumococcal Conjugate-13 03/29/2020, 06/07/2020, 11/01/2020   Pneumococcal Polysaccharide-23 05/02/2021   Td 10/14/1997, 06/16/2008   Tdap 06/09/2018   Zoster Recombinant(Shingrix) 03/29/2020, 06/07/2020    Screening Tests Health Maintenance  Topic Date Due   Colonoscopy  07/21/2023   Influenza Vaccine  05/14/2024   COVID-19 Vaccine (4 - 2025-26 season) 06/14/2024   Medicare Annual Wellness (AWV)  08/04/2025   DTaP/Tdap/Td (7 - Td or Tdap) 11/01/2030   Pneumococcal Vaccine: 50+ Years  Completed   Hepatitis C Screening  Completed   Zoster Vaccines- Shingrix  Completed   Meningococcal B Vaccine  Aged Out   Hepatitis B Vaccines 19-59 Average Risk  Discontinued    Health Maintenance Items Addressed: None at this time  Defer some care until immune system better and finish cancer care  Additional Screening:  Vision Screening: Recommended annual ophthalmology exams for early detection of glaucoma and other disorders of the eye. Is the patient up to date with their annual eye exam?  Yes  Who is the provider or what is the name of the office in which the patient attends annual eye exams? [facility]  Dental Screening:  Recommended annual dental exams for proper oral hygiene  Community Resource Referral / Chronic Care Management: CRR required this visit?  No   CCM required this visit?  No   Plan:    I have personally reviewed and noted the following in the patient's chart:   Medical and social history Use of alcohol, tobacco or illicit drugs  Current medications and supplements including opioid prescriptions. Patient is not currently taking opioid prescriptions. Functional ability and status Nutritional status Physical activity Advanced directives List of other physicians Hospitalizations, surgeries, and ER visits in previous 12 months Vitals Screenings to include cognitive, depression, and falls Referrals and appointments  In addition, I have reviewed and discussed with patient certain preventive protocols, quality metrics, and best practice recommendations. A written personalized care plan for preventive services as well as general preventive health recommendations were provided to patient.   Erminio LITTIE Saris, LPN   [id number]   After Visit Summary: (In Person-Declined) Patient declined AVS at this time.  Notes: Nothing significant to report at this time. Pt declines vaccines at this time due to imunno-compromised (per pt).

## 2024-08-05 ENCOUNTER — Encounter: Payer: Self-pay | Admitting: Family Medicine

## 2024-08-05 ENCOUNTER — Ambulatory Visit (INDEPENDENT_AMBULATORY_CARE_PROVIDER_SITE_OTHER): Admitting: Family Medicine

## 2024-08-05 VITALS — BP 130/82 | HR 66 | Temp 98.6°F | Ht 67.5 in | Wt 205.2 lb

## 2024-08-05 DIAGNOSIS — Z0000 Encounter for general adult medical examination without abnormal findings: Secondary | ICD-10-CM

## 2024-08-05 DIAGNOSIS — Z7189 Other specified counseling: Secondary | ICD-10-CM | POA: Diagnosis not present

## 2024-08-05 DIAGNOSIS — I10 Essential (primary) hypertension: Secondary | ICD-10-CM | POA: Diagnosis not present

## 2024-08-05 DIAGNOSIS — Z9481 Bone marrow transplant status: Secondary | ICD-10-CM

## 2024-08-05 NOTE — Patient Instructions (Addendum)
 Check your med list at home and let me know if you need refills.  Take care.  Glad to see you.

## 2024-08-05 NOTE — Progress Notes (Signed)
 Fatigue noted.  Low platelets.  He had recent biopsy.  No bleeding.  Recently had diarrhea and vomiting but that got better in the meantime.   He has f/u pending for tomorrow.   Discussed recent lab results that were available at time of office visit.  I still need input from ordering clinic.  Discussed. No immunophenotypic evidence of plasma cell neoplasm. Normocellular bone marrow (40%) with trilineage hematopoiesis. See comment.   Comment:  Associated flow cytometric analysis ([id number]) is negative for an abnormal population of plasma cells.  CD138 shows virtually no plasma cells.  Please correlate with the pending cytogenetics, FISH and molecular studies for complete evaluation.  A. Bone marrow, flow cytometric immunophenotyping:   Negative for a distinct abnormal plasma cells population.  Inverted CD4:CD8 ratio (0.2:1).  IFE Serum No monoclonal component identified.  I have personally performed this interpretation.   =========================  Flu 2024- I asked him to check with hematology.   Shingles previously done PNA previously done Tetanus 2019 Colonoscopy deferred given current hematology evaluation that is ongoing. Prostate cancer screening 2024 per urology.  He can call about urology in 2026.   Advance directive-wife designated if patient were incapacitated.  Deferred some of the above given ongoing hematology eval and tx.   Hypertension:    Using medication without problems or lightheadedness: yes Chest pain with exertion:no Edema:no Short of breath:no Lipids improved from prior.    Meds, vitals, and allergies reviewed.   ROS: Per HPI unless specifically indicated in ROS section   GEN: nad, alert and oriented HEENT: mucous membranes moist NECK: supple w/o LA CV: rrr.  no murmur PULM: ctab, no inc wob ABD: soft, +bs EXT: no edema SKIN: well perfused.  No bruising.    32 minutes were devoted to patient care in this encounter (this includes time spent  reviewing the patient's file/history, interviewing and examining the patient, counseling/reviewing plan with patient).

## 2024-08-06 ENCOUNTER — Telehealth: Payer: Self-pay

## 2024-08-06 ENCOUNTER — Other Ambulatory Visit: Payer: Self-pay

## 2024-08-06 DIAGNOSIS — T8090XA Unspecified complication following infusion and therapeutic injection, initial encounter: Secondary | ICD-10-CM

## 2024-08-06 DIAGNOSIS — C9000 Multiple myeloma not having achieved remission: Secondary | ICD-10-CM

## 2024-08-06 NOTE — Telephone Encounter (Signed)
 Dr. Rojelio contacted Dr. Babara to request for pt to be seen due to low platelet count. Pt has been notified and scheduled to see Dr. Babara the week of 10/27.

## 2024-08-08 DIAGNOSIS — Z0000 Encounter for general adult medical examination without abnormal findings: Secondary | ICD-10-CM | POA: Insufficient documentation

## 2024-08-08 NOTE — Assessment & Plan Note (Signed)
  Flu 2024- I asked him to check with hematology.   Shingles previously done PNA previously done Tetanus 2019 Colonoscopy deferred given current hematology evaluation that is ongoing. Prostate cancer screening 2024 per urology.  He can call about urology in 2026.   Advance directive-wife designated if patient were incapacitated.

## 2024-08-08 NOTE — Assessment & Plan Note (Signed)
 Advance directive- wife designated if patient were incapacitated.

## 2024-08-08 NOTE — Assessment & Plan Note (Signed)
 He has follow-up pending.  Discussed recent lab results that were available at time of office visit.  I still need input from ordering clinic.

## 2024-08-08 NOTE — Assessment & Plan Note (Signed)
 Lipids improved from prior.   Labs discussed.  Continue amlodipine  lisinopril  metoprolol .

## 2024-08-10 ENCOUNTER — Inpatient Hospital Stay: Attending: Oncology

## 2024-08-10 DIAGNOSIS — C9002 Multiple myeloma in relapse: Secondary | ICD-10-CM | POA: Diagnosis present

## 2024-08-10 DIAGNOSIS — N1831 Chronic kidney disease, stage 3a: Secondary | ICD-10-CM | POA: Insufficient documentation

## 2024-08-10 DIAGNOSIS — D696 Thrombocytopenia, unspecified: Secondary | ICD-10-CM | POA: Insufficient documentation

## 2024-08-10 DIAGNOSIS — Z9484 Stem cells transplant status: Secondary | ICD-10-CM | POA: Diagnosis not present

## 2024-08-10 DIAGNOSIS — C9000 Multiple myeloma not having achieved remission: Secondary | ICD-10-CM

## 2024-08-10 LAB — CBC WITH DIFFERENTIAL (CANCER CENTER ONLY)
Abs Immature Granulocytes: 0.01 K/uL (ref 0.00–0.07)
Basophils Absolute: 0 K/uL (ref 0.0–0.1)
Basophils Relative: 0 %
Eosinophils Absolute: 0 K/uL (ref 0.0–0.5)
Eosinophils Relative: 1 %
HCT: 32.4 % — ABNORMAL LOW (ref 39.0–52.0)
Hemoglobin: 10.8 g/dL — ABNORMAL LOW (ref 13.0–17.0)
Immature Granulocytes: 0 %
Lymphocytes Relative: 43 %
Lymphs Abs: 1.2 K/uL (ref 0.7–4.0)
MCH: 34.4 pg — ABNORMAL HIGH (ref 26.0–34.0)
MCHC: 33.3 g/dL (ref 30.0–36.0)
MCV: 103.2 fL — ABNORMAL HIGH (ref 80.0–100.0)
Monocytes Absolute: 0.2 K/uL (ref 0.1–1.0)
Monocytes Relative: 8 %
Neutro Abs: 1.3 K/uL — ABNORMAL LOW (ref 1.7–7.7)
Neutrophils Relative %: 48 %
Platelet Count: 25 K/uL — ABNORMAL LOW (ref 150–400)
RBC: 3.14 MIL/uL — ABNORMAL LOW (ref 4.22–5.81)
RDW: 15.9 % — ABNORMAL HIGH (ref 11.5–15.5)
WBC Count: 2.9 K/uL — ABNORMAL LOW (ref 4.0–10.5)
nRBC: 0 % (ref 0.0–0.2)

## 2024-08-10 LAB — TECHNOLOGIST SMEAR REVIEW: Plt Morphology: DECREASED

## 2024-08-10 LAB — VITAMIN B12: Vitamin B-12: 621 pg/mL (ref 180–914)

## 2024-08-10 LAB — SAMPLE TO BLOOD BANK

## 2024-08-10 LAB — IMMATURE PLATELET FRACTION: Immature Platelet Fraction: 5.9 % (ref 1.2–8.6)

## 2024-08-10 LAB — FOLATE: Folate: 8.1 ng/mL (ref >5.9)

## 2024-08-11 ENCOUNTER — Inpatient Hospital Stay

## 2024-08-11 ENCOUNTER — Inpatient Hospital Stay (HOSPITAL_BASED_OUTPATIENT_CLINIC_OR_DEPARTMENT_OTHER): Admitting: Oncology

## 2024-08-11 ENCOUNTER — Encounter: Payer: Self-pay | Admitting: Oncology

## 2024-08-11 VITALS — BP 148/96 | HR 78 | Temp 98.0°F | Resp 18 | Wt 206.6 lb

## 2024-08-11 DIAGNOSIS — N1831 Chronic kidney disease, stage 3a: Secondary | ICD-10-CM | POA: Diagnosis not present

## 2024-08-11 DIAGNOSIS — D696 Thrombocytopenia, unspecified: Secondary | ICD-10-CM

## 2024-08-11 DIAGNOSIS — C9002 Multiple myeloma in relapse: Secondary | ICD-10-CM | POA: Diagnosis not present

## 2024-08-11 DIAGNOSIS — T8090XA Unspecified complication following infusion and therapeutic injection, initial encounter: Secondary | ICD-10-CM

## 2024-08-11 DIAGNOSIS — C9000 Multiple myeloma not having achieved remission: Secondary | ICD-10-CM | POA: Diagnosis not present

## 2024-08-11 DIAGNOSIS — R771 Abnormality of globulin: Secondary | ICD-10-CM

## 2024-08-11 DIAGNOSIS — N179 Acute kidney failure, unspecified: Secondary | ICD-10-CM

## 2024-08-11 MED ORDER — ROMIPLOSTIM 125 MCG ~~LOC~~ SOLR
1.0000 ug/kg | Freq: Once | SUBCUTANEOUS | Status: AC
  Administered 2024-08-11: 95 ug via SUBCUTANEOUS
  Filled 2024-08-11: qty 0.19

## 2024-08-11 NOTE — Assessment & Plan Note (Signed)
 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 He has been started on Nplate at [facility] Today's platelet count is 25,000.  Recommend Nplate 1 mcg/kg weekly x 4.  Hold if reticulocyte count is above 50,000.
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
## 2024-08-11 NOTE — Assessment & Plan Note (Signed)
 Avoid nephrotoxin.  Encourage hydration.

## 2024-08-11 NOTE — Assessment & Plan Note (Signed)
 Kappa Light chain multiple myeloma Stage II.cytogenetics showed normal male chromosome, MDS FISH panel showed trisomy 67- Standard Risk.S/p RVD x 9 04/23/2019 autologous bone marrow stem cell transplant at [facility],   Ixazomib 3mg  on D1/D8/D15 out of 28 day cycle  06/04/2021 Early relapse multiple myeloma.   5% plasma cells 05/2021- -11/2021,  7 cycles of daratumumab /Pomalyst /dexamethasone . 11/20/2021, bone marrow biopsy which showed 1% of plasma cells.  Started Daratumumab  maintenance.  05/08/2023 bone marrow biopsy: persistent low-level plasma cell neoplasm.  06/2023: added Revlimid  to Daratumumab  / Dexamethasone , Revlimid  was poorly tolerated and was discontinued  He was maintained on Daratumumab  until March 2025  11/21/2023 T-cell collection (apheresis) for Carvykti CAR-T manufacturing  01/16/2024 S/p  Carvykti CAR-T at [facility] are reviewed and discussed with patient.  Lab Results  Component Value Date   MPROTEIN Not Observed 07/01/2024   KPAFRELGTCHN 0.8 (L) 07/01/2024   LAMBDASER <1.5 (L) 07/01/2024   KAPLAMBRATIO >0.53 07/01/2024   Continue follow up with [facility]. He is doing well clinically.  He will get immunization at [facility].   Prophylactic Bactrim  three times weekly was recently discontinued due to thrombocytopenia.  Aciclovir twice daily

## 2024-08-16 ENCOUNTER — Other Ambulatory Visit: Payer: Self-pay | Admitting: Family Medicine

## 2024-08-16 NOTE — Telephone Encounter (Unsigned)
 Copied from CRM [id number]. Topic: Clinical - Medication Refill >> Aug 16, 2024  9:24 AM Tiffini S wrote: Medication: metoprolol  succinate (TOPROL -XL) 50 MG 24 hr tablet, magnesium  oxide (MAG-OX) 400 MG tablet,  lisinopril  (ZESTRIL ) 20 MG tablet   Has the patient contacted their pharmacy? Yes (Agent: If no, request that the patient contact the pharmacy for the refill. If patient does not wish to contact the pharmacy document the reason why and proceed with request.) (Agent: If yes, when and what did the pharmacy advise?)  This is the patient's preferred pharmacy:  [id number] [place], [place] - [address] AT [facility] OF [address] [place] [address] Phone: [phone] Fax: [phone]   Is this the correct pharmacy for this prescription? Yes If no, delete pharmacy and type the correct one.   Has the prescription been filled recently? Yes  Is the patient out of the medication? Yes  Has the patient been seen for an appointment in the last year OR does the patient have an upcoming appointment? Yes  Can we respond through MyChart? Yes  Agent: Please be advised that Rx refills may take up to 3 business days. We ask that you follow-up with your pharmacy.

## 2024-08-18 ENCOUNTER — Inpatient Hospital Stay

## 2024-08-18 ENCOUNTER — Inpatient Hospital Stay: Attending: Oncology

## 2024-08-18 VITALS — Wt 205.5 lb

## 2024-08-18 DIAGNOSIS — C9002 Multiple myeloma in relapse: Secondary | ICD-10-CM | POA: Diagnosis present

## 2024-08-18 DIAGNOSIS — D696 Thrombocytopenia, unspecified: Secondary | ICD-10-CM | POA: Diagnosis not present

## 2024-08-18 DIAGNOSIS — R771 Abnormality of globulin: Secondary | ICD-10-CM

## 2024-08-18 DIAGNOSIS — C9000 Multiple myeloma not having achieved remission: Secondary | ICD-10-CM

## 2024-08-18 DIAGNOSIS — N179 Acute kidney failure, unspecified: Secondary | ICD-10-CM

## 2024-08-18 DIAGNOSIS — N1831 Chronic kidney disease, stage 3a: Secondary | ICD-10-CM | POA: Diagnosis not present

## 2024-08-18 LAB — CBC WITH DIFFERENTIAL (CANCER CENTER ONLY)
Abs Immature Granulocytes: 0.01 K/uL (ref 0.00–0.07)
Basophils Absolute: 0 K/uL (ref 0.0–0.1)
Basophils Relative: 0 %
Eosinophils Absolute: 0 K/uL (ref 0.0–0.5)
Eosinophils Relative: 1 %
HCT: 34.2 % — ABNORMAL LOW (ref 39.0–52.0)
Hemoglobin: 11.8 g/dL — ABNORMAL LOW (ref 13.0–17.0)
Immature Granulocytes: 0 %
Lymphocytes Relative: 34 %
Lymphs Abs: 1.2 K/uL (ref 0.7–4.0)
MCH: 34.6 pg — ABNORMAL HIGH (ref 26.0–34.0)
MCHC: 34.5 g/dL (ref 30.0–36.0)
MCV: 100.3 fL — ABNORMAL HIGH (ref 80.0–100.0)
Monocytes Absolute: 0.3 K/uL (ref 0.1–1.0)
Monocytes Relative: 9 %
Neutro Abs: 2 K/uL (ref 1.7–7.7)
Neutrophils Relative %: 56 %
Platelet Count: 35 K/uL — ABNORMAL LOW (ref 150–400)
RBC: 3.41 MIL/uL — ABNORMAL LOW (ref 4.22–5.81)
RDW: 15.4 % (ref 11.5–15.5)
WBC Count: 3.5 K/uL — ABNORMAL LOW (ref 4.0–10.5)
nRBC: 0 % (ref 0.0–0.2)

## 2024-08-18 MED ORDER — LISINOPRIL 20 MG PO TABS: 10.0000 mg | ORAL_TABLET | Freq: Every day | ORAL | 3 refills | Status: DC

## 2024-08-18 MED ORDER — ROMIPLOSTIM 125 MCG ~~LOC~~ SOLR
1.0000 ug/kg | Freq: Once | SUBCUTANEOUS | Status: AC
  Administered 2024-08-18: 95 ug via SUBCUTANEOUS
  Filled 2024-08-18: qty 0.19

## 2024-08-18 MED ORDER — METOPROLOL SUCCINATE ER 50 MG PO TB24: 50.0000 mg | ORAL_TABLET | Freq: Every day | ORAL | 3 refills | Status: DC

## 2024-08-19 ENCOUNTER — Ambulatory Visit: Payer: Self-pay | Admitting: Oncology

## 2024-08-20 ENCOUNTER — Telehealth: Payer: Self-pay

## 2024-08-20 NOTE — Telephone Encounter (Signed)
 Copied from CRM #[id number]. Topic: Clinical - Medication Question >> Aug 20, 2024  3:28 PM Donna BRAVO wrote: Reason for CRM: patient would like to know how many and how often to take metoprolol  succinate (TOPROL -XL) 50 MG 24 hr tablet Patient spoke to Dr Cleatus at the appt said he should take a 1/2 pill in the morning and 1/2 at in the evening. Patient would like clarification on this  Patient was informed they will receive a call back within 1 business day.

## 2024-08-20 NOTE — Telephone Encounter (Signed)
 He could try metoprolol  1/2 tab BID if needed.   If BP is controlled and he doesn't have significant variation in BP throughout the day, he could take it 1 tab a day instead.  Thanks.

## 2024-08-24 NOTE — Telephone Encounter (Signed)
 Contacted and spoke with patient.  Relayed information on medication instructions.  Pt verbalized understanding and has no questions or concerns.

## 2024-08-25 ENCOUNTER — Inpatient Hospital Stay

## 2024-08-25 VITALS — Wt 205.0 lb

## 2024-08-25 DIAGNOSIS — N179 Acute kidney failure, unspecified: Secondary | ICD-10-CM

## 2024-08-25 DIAGNOSIS — C9002 Multiple myeloma in relapse: Secondary | ICD-10-CM | POA: Diagnosis not present

## 2024-08-25 DIAGNOSIS — C9000 Multiple myeloma not having achieved remission: Secondary | ICD-10-CM

## 2024-08-25 DIAGNOSIS — R771 Abnormality of globulin: Secondary | ICD-10-CM

## 2024-08-25 LAB — CBC WITH DIFFERENTIAL (CANCER CENTER ONLY)
Abs Immature Granulocytes: 0.01 K/uL (ref 0.00–0.07)
Basophils Absolute: 0 K/uL (ref 0.0–0.1)
Basophils Relative: 0 %
Eosinophils Absolute: 0 K/uL (ref 0.0–0.5)
Eosinophils Relative: 1 %
HCT: 33.9 % — ABNORMAL LOW (ref 39.0–52.0)
Hemoglobin: 11.8 g/dL — ABNORMAL LOW (ref 13.0–17.0)
Immature Granulocytes: 0 %
Lymphocytes Relative: 31 %
Lymphs Abs: 0.9 K/uL (ref 0.7–4.0)
MCH: 35.1 pg — ABNORMAL HIGH (ref 26.0–34.0)
MCHC: 34.8 g/dL (ref 30.0–36.0)
MCV: 100.9 fL — ABNORMAL HIGH (ref 80.0–100.0)
Monocytes Absolute: 0.3 K/uL (ref 0.1–1.0)
Monocytes Relative: 11 %
Neutro Abs: 1.6 K/uL — ABNORMAL LOW (ref 1.7–7.7)
Neutrophils Relative %: 57 %
Platelet Count: 46 K/uL — ABNORMAL LOW (ref 150–400)
RBC: 3.36 MIL/uL — ABNORMAL LOW (ref 4.22–5.81)
RDW: 15 % (ref 11.5–15.5)
WBC Count: 2.9 K/uL — ABNORMAL LOW (ref 4.0–10.5)
nRBC: 0 % (ref 0.0–0.2)

## 2024-08-25 MED ORDER — ROMIPLOSTIM 125 MCG ~~LOC~~ SOLR
1.0000 ug/kg | Freq: Once | SUBCUTANEOUS | Status: AC
  Administered 2024-08-25: 95 ug via SUBCUTANEOUS
  Filled 2024-08-25: qty 0.19

## 2024-09-01 ENCOUNTER — Telehealth: Payer: Self-pay

## 2024-09-01 ENCOUNTER — Inpatient Hospital Stay

## 2024-09-01 VITALS — Wt 206.0 lb

## 2024-09-01 DIAGNOSIS — C9000 Multiple myeloma not having achieved remission: Secondary | ICD-10-CM

## 2024-09-01 DIAGNOSIS — R771 Abnormality of globulin: Secondary | ICD-10-CM

## 2024-09-01 DIAGNOSIS — N179 Acute kidney failure, unspecified: Secondary | ICD-10-CM

## 2024-09-01 DIAGNOSIS — C9002 Multiple myeloma in relapse: Secondary | ICD-10-CM | POA: Diagnosis not present

## 2024-09-01 LAB — CBC WITH DIFFERENTIAL (CANCER CENTER ONLY)
Abs Immature Granulocytes: 0 K/uL (ref 0.00–0.07)
Basophils Absolute: 0 K/uL (ref 0.0–0.1)
Basophils Relative: 0 %
Eosinophils Absolute: 0 K/uL (ref 0.0–0.5)
Eosinophils Relative: 1 %
HCT: 34.3 % — ABNORMAL LOW (ref 39.0–52.0)
Hemoglobin: 11.8 g/dL — ABNORMAL LOW (ref 13.0–17.0)
Immature Granulocytes: 0 %
Lymphocytes Relative: 31 %
Lymphs Abs: 1 K/uL (ref 0.7–4.0)
MCH: 34.5 pg — ABNORMAL HIGH (ref 26.0–34.0)
MCHC: 34.4 g/dL (ref 30.0–36.0)
MCV: 100.3 fL — ABNORMAL HIGH (ref 80.0–100.0)
Monocytes Absolute: 0.3 K/uL (ref 0.1–1.0)
Monocytes Relative: 10 %
Neutro Abs: 1.8 K/uL (ref 1.7–7.7)
Neutrophils Relative %: 58 %
Platelet Count: 51 K/uL — ABNORMAL LOW (ref 150–400)
RBC: 3.42 MIL/uL — ABNORMAL LOW (ref 4.22–5.81)
RDW: 14.7 % (ref 11.5–15.5)
WBC Count: 3.1 K/uL — ABNORMAL LOW (ref 4.0–10.5)
nRBC: 0 % (ref 0.0–0.2)

## 2024-09-01 MED ORDER — ROMIPLOSTIM 125 MCG ~~LOC~~ SOLR: 1.0000 ug/kg | Freq: Once | SUBCUTANEOUS | Status: DC

## 2024-09-01 NOTE — Progress Notes (Signed)
 Platelets at 51,000 this morning; treatment parameters say: hold is platelets => 50,000

## 2024-09-01 NOTE — Telephone Encounter (Signed)
 Copied from CRM #[id number]. Topic: Clinical - Medical Advice >> Sep 01, 2024  8:43 AM Vena HERO wrote: Reason for CRM: Pt had a visit at the cancer center and checked BP after visit, 150/90 and has been running a little high for the past few days. Taking one metropolol in morning and one in evening but was told to cut in half each time and has not done that and BP is still high. He needs clarification on what he should do as far as meds or does he need to come in for a BP check and discussion. Please follow up to advise pt at [phone]

## 2024-09-01 NOTE — Telephone Encounter (Signed)
 Please verify that he is taking 10mg  lisinopril  per day, a total of 10mg  amlodipine  per day, and metprolol 50mg  twice a day.    Please let me know about his pulse on his BP checks.    We'll make plans at that point.  Thanks.

## 2024-09-02 NOTE — Telephone Encounter (Signed)
 Spoke with pt confirming how he's taking BP meds. Pt states, since transplant, he's taking: 0.5 tab of 20 mg lisinopril  once a day, amlodipine  5 mg 1 tab BID, and metoprolol  XL 50 mg 1 tab BID.   Says he hasn't paid attention to pulse during BP checks, so focused on BP. However, he will start logging it with BP checks and will let Dr Cleatus know how it's running.

## 2024-09-03 MED ORDER — METOPROLOL SUCCINATE ER 50 MG PO TB24: 50.0000 mg | ORAL_TABLET | Freq: Two times a day (BID) | ORAL | Status: DC

## 2024-09-03 NOTE — Addendum Note (Signed)
 Addended by: CLEATUS ARLYSS RAMAN on: 09/03/2024 07:00 AM   Modules accepted: Orders

## 2024-09-03 NOTE — Telephone Encounter (Signed)
 Spoke with pt relaying Dr Elfredia message. Pt verbalizes understanding and will call back 11/24 or 11/25 with BP & pulse readings.

## 2024-09-03 NOTE — Telephone Encounter (Signed)
 Noted, thanks.  Please have him let me know about his blood pressure and pulse over the next few days.  If his pulse is relatively low then we should not increase the metoprolol  and we likely could increase his lisinopril .  Thanks.

## 2024-09-08 ENCOUNTER — Inpatient Hospital Stay

## 2024-09-08 DIAGNOSIS — C9000 Multiple myeloma not having achieved remission: Secondary | ICD-10-CM

## 2024-09-08 DIAGNOSIS — C9002 Multiple myeloma in relapse: Secondary | ICD-10-CM | POA: Diagnosis not present

## 2024-09-08 LAB — CBC WITH DIFFERENTIAL (CANCER CENTER ONLY)
Abs Immature Granulocytes: 0.01 K/uL (ref 0.00–0.07)
Basophils Absolute: 0 K/uL (ref 0.0–0.1)
Basophils Relative: 0 %
Eosinophils Absolute: 0 K/uL (ref 0.0–0.5)
Eosinophils Relative: 1 %
HCT: 33.4 % — ABNORMAL LOW (ref 39.0–52.0)
Hemoglobin: 11.3 g/dL — ABNORMAL LOW (ref 13.0–17.0)
Immature Granulocytes: 0 %
Lymphocytes Relative: 30 %
Lymphs Abs: 1 K/uL (ref 0.7–4.0)
MCH: 34.6 pg — ABNORMAL HIGH (ref 26.0–34.0)
MCHC: 33.8 g/dL (ref 30.0–36.0)
MCV: 102.1 fL — ABNORMAL HIGH (ref 80.0–100.0)
Monocytes Absolute: 0.4 K/uL (ref 0.1–1.0)
Monocytes Relative: 10 %
Neutro Abs: 2.1 K/uL (ref 1.7–7.7)
Neutrophils Relative %: 59 %
Platelet Count: 52 K/uL — ABNORMAL LOW (ref 150–400)
RBC: 3.27 MIL/uL — ABNORMAL LOW (ref 4.22–5.81)
RDW: 14.6 % (ref 11.5–15.5)
WBC Count: 3.5 K/uL — ABNORMAL LOW (ref 4.0–10.5)
nRBC: 0 % (ref 0.0–0.2)

## 2024-09-08 NOTE — Progress Notes (Signed)
 Patient platelets are at 52,000 this morning; Per parameters hold if greater than 50,000.

## 2024-09-13 ENCOUNTER — Telehealth: Payer: Self-pay

## 2024-09-13 MED ORDER — LISINOPRIL 20 MG PO TABS: 20.0000 mg | ORAL_TABLET | Freq: Every day | ORAL | Status: DC

## 2024-09-13 NOTE — Telephone Encounter (Signed)
 Copied from CRM #[id number]. Topic: Clinical - Medical Advice >> Sep 13, 2024 11:40 AM Sophia H wrote: Reason for CRM: Patient states he was told to follow up with his blood pressures and pulse from last week.  States BP was running 157/97 and his pulse was 64 (last week) Checked while I was on the line with him and he stated it was 170/103 with pulse 68.   Please reach out and advise, states his BP normally runs in the 150's. Patient states if he needs to come in to let him know, he is close by. # [phone]

## 2024-09-13 NOTE — Addendum Note (Signed)
 Addended by: CLEATUS ARLYSS RAMAN on: 09/13/2024 02:02 PM   Modules accepted: Orders

## 2024-09-13 NOTE — Telephone Encounter (Signed)
 I would try increasing lisinopril  to 20mg  a day.  He could take 1/2 tab, ie 10mg , BID or he could try 20mg  once daily.    Let me know if BP is persistently above 140/90 after about 1 week.    Thanks.

## 2024-09-14 ENCOUNTER — Other Ambulatory Visit: Payer: Self-pay

## 2024-09-14 DIAGNOSIS — C9000 Multiple myeloma not having achieved remission: Secondary | ICD-10-CM

## 2024-09-14 DIAGNOSIS — N179 Acute kidney failure, unspecified: Secondary | ICD-10-CM

## 2024-09-14 NOTE — Telephone Encounter (Signed)
 Called patient reviewed all information and repeated back to me. Will call if any questions.  ? ?

## 2024-09-15 ENCOUNTER — Inpatient Hospital Stay: Admitting: Oncology

## 2024-09-15 ENCOUNTER — Inpatient Hospital Stay

## 2024-09-15 ENCOUNTER — Inpatient Hospital Stay: Attending: Oncology

## 2024-09-15 ENCOUNTER — Encounter: Payer: Self-pay | Admitting: Oncology

## 2024-09-15 ENCOUNTER — Other Ambulatory Visit: Payer: Self-pay | Admitting: Family Medicine

## 2024-09-15 VITALS — BP 142/89 | HR 67 | Temp 97.5°F | Resp 18 | Wt 207.0 lb

## 2024-09-15 DIAGNOSIS — R0602 Shortness of breath: Secondary | ICD-10-CM | POA: Diagnosis not present

## 2024-09-15 DIAGNOSIS — R7989 Other specified abnormal findings of blood chemistry: Secondary | ICD-10-CM | POA: Diagnosis not present

## 2024-09-15 DIAGNOSIS — N179 Acute kidney failure, unspecified: Secondary | ICD-10-CM | POA: Diagnosis not present

## 2024-09-15 DIAGNOSIS — R112 Nausea with vomiting, unspecified: Secondary | ICD-10-CM | POA: Insufficient documentation

## 2024-09-15 DIAGNOSIS — R197 Diarrhea, unspecified: Secondary | ICD-10-CM | POA: Diagnosis not present

## 2024-09-15 DIAGNOSIS — C9001 Multiple myeloma in remission: Secondary | ICD-10-CM

## 2024-09-15 DIAGNOSIS — N1831 Chronic kidney disease, stage 3a: Secondary | ICD-10-CM | POA: Diagnosis not present

## 2024-09-15 DIAGNOSIS — Z9481 Bone marrow transplant status: Secondary | ICD-10-CM | POA: Diagnosis not present

## 2024-09-15 DIAGNOSIS — Z5111 Encounter for antineoplastic chemotherapy: Secondary | ICD-10-CM | POA: Diagnosis not present

## 2024-09-15 DIAGNOSIS — C9002 Multiple myeloma in relapse: Secondary | ICD-10-CM | POA: Insufficient documentation

## 2024-09-15 DIAGNOSIS — Z9484 Stem cells transplant status: Secondary | ICD-10-CM | POA: Diagnosis not present

## 2024-09-15 DIAGNOSIS — Z8042 Family history of malignant neoplasm of prostate: Secondary | ICD-10-CM | POA: Diagnosis not present

## 2024-09-15 DIAGNOSIS — Z800 Family history of malignant neoplasm of digestive organs: Secondary | ICD-10-CM | POA: Insufficient documentation

## 2024-09-15 DIAGNOSIS — C9000 Multiple myeloma not having achieved remission: Secondary | ICD-10-CM

## 2024-09-15 DIAGNOSIS — D696 Thrombocytopenia, unspecified: Secondary | ICD-10-CM

## 2024-09-15 DIAGNOSIS — Z9221 Personal history of antineoplastic chemotherapy: Secondary | ICD-10-CM | POA: Diagnosis not present

## 2024-09-15 LAB — CBC (CANCER CENTER ONLY)
HCT: 34.7 % — ABNORMAL LOW (ref 39.0–52.0)
Hemoglobin: 12.1 g/dL — ABNORMAL LOW (ref 13.0–17.0)
MCH: 34.8 pg — ABNORMAL HIGH (ref 26.0–34.0)
MCHC: 34.9 g/dL (ref 30.0–36.0)
MCV: 99.7 fL (ref 80.0–100.0)
Platelet Count: 51 K/uL — ABNORMAL LOW (ref 150–400)
RBC: 3.48 MIL/uL — ABNORMAL LOW (ref 4.22–5.81)
RDW: 14.3 % (ref 11.5–15.5)
WBC Count: 4.3 K/uL (ref 4.0–10.5)
nRBC: 0 % (ref 0.0–0.2)

## 2024-09-15 NOTE — Assessment & Plan Note (Signed)
 Avoid nephrotoxin.  Encourage hydration.

## 2024-09-15 NOTE — Assessment & Plan Note (Addendum)
 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 He has been started on Nplate  1 mcg/kg at [facility]. Today's platelet count is >50,000   Hold off Nplate   as platelet count is above 50,000. Continue weekly monitoring, if platelet remains stable, will discontinue N Plate

## 2024-09-15 NOTE — Progress Notes (Signed)
 Platelets at 51,000 today; per provider no Nplate      Parameters are to hold if platelets are greater than 50,000

## 2024-09-15 NOTE — Assessment & Plan Note (Addendum)
 Not on any treatment currently. S/p CAR T

## 2024-09-15 NOTE — Progress Notes (Signed)
 Pt here for follow up. Pt reports that he needs to have carcinoma removal surgery from face, but he needs clearance due to platelet count

## 2024-09-15 NOTE — Telephone Encounter (Signed)
 Please check with patient.  I thought he was not taking hydralazine  at this point.  If he is please let me know.  If he is not then please deny this prescription.  Thanks.
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
## 2024-09-15 NOTE — Assessment & Plan Note (Addendum)
 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   5% plasma cells 05/2021- -11/2021,  7 cycles of daratumumab /Pomalyst /dexamethasone . 11/20/2021, bone marrow biopsy which showed 1% of plasma cells.  Started Daratumumab  maintenance.  05/08/2023 bone marrow biopsy: persistent low-level plasma cell neoplasm.  06/2023: added Revlimid  to Daratumumab  / Dexamethasone , Revlimid  was poorly tolerated and was discontinued  He was maintained on Daratumumab  until March 2025  11/21/2023 T-cell collection (apheresis) for Carvykti CAR-T manufacturing  01/16/2024 S/p  Carvykti CAR-T at [facility] are reviewed and discussed with patient.  Lab Results  Component Value Date   MPROTEIN Not Observed 07/01/2024   KPAFRELGTCHN 0.8 (L) 07/01/2024   LAMBDASER <1.5 (L) 07/01/2024   KAPLAMBRATIO >0.53 07/01/2024   Continue follow up with [facility]. He is doing well clinically.  He gets immunization at [facility].  Prophylactic Bactrim  three times weekly was recently discontinued due to thrombocytopenia.  Aciclovir twice daily

## 2024-09-16 NOTE — Telephone Encounter (Signed)
 See below, please check with patient and let me know.  Thanks.

## 2024-09-17 NOTE — Telephone Encounter (Signed)
 I denied the rx.  Did you check with patient about this?  Please make sure he is aware it was denied and that he is not on the med.  Thanks.

## 2024-09-17 NOTE — Telephone Encounter (Signed)
 This Rx was discontinued on 03/04/24

## 2024-09-17 NOTE — Telephone Encounter (Signed)
 Spoke with pt asking about hydralazine  rx. Pt confirms he no longer takes this med. Fyi to Dr Cleatus.

## 2024-09-17 NOTE — Telephone Encounter (Signed)
 Noted. Thanks.

## 2024-09-20 ENCOUNTER — Encounter: Payer: Self-pay | Admitting: Hospice and Palliative Medicine

## 2024-09-20 ENCOUNTER — Telehealth: Payer: Self-pay | Admitting: *Deleted

## 2024-09-20 ENCOUNTER — Inpatient Hospital Stay: Admitting: Hospice and Palliative Medicine

## 2024-09-20 ENCOUNTER — Inpatient Hospital Stay

## 2024-09-20 ENCOUNTER — Ambulatory Visit
Admission: RE | Admit: 2024-09-20 | Discharge: 2024-09-20 | Disposition: A | Attending: Hospice and Palliative Medicine | Admitting: Hospice and Palliative Medicine

## 2024-09-20 ENCOUNTER — Other Ambulatory Visit: Payer: Self-pay

## 2024-09-20 ENCOUNTER — Ambulatory Visit
Admission: RE | Admit: 2024-09-20 | Discharge: 2024-09-20 | Disposition: A | Source: Ambulatory Visit | Attending: Hospice and Palliative Medicine

## 2024-09-20 VITALS — BP 112/90 | HR 92 | Temp 98.5°F | Resp 18 | Ht 67.5 in | Wt 205.0 lb

## 2024-09-20 DIAGNOSIS — C9002 Multiple myeloma in relapse: Secondary | ICD-10-CM | POA: Diagnosis not present

## 2024-09-20 DIAGNOSIS — C9001 Multiple myeloma in remission: Secondary | ICD-10-CM

## 2024-09-20 DIAGNOSIS — R197 Diarrhea, unspecified: Secondary | ICD-10-CM

## 2024-09-20 LAB — GASTROINTESTINAL PANEL BY PCR, STOOL (REPLACES STOOL CULTURE)

## 2024-09-20 LAB — CBC WITH DIFFERENTIAL (CANCER CENTER ONLY)
Abs Immature Granulocytes: 0.04 K/uL (ref 0.00–0.07)
Basophils Absolute: 0 K/uL (ref 0.0–0.1)
Basophils Relative: 0 %
Eosinophils Absolute: 0 K/uL (ref 0.0–0.5)
Eosinophils Relative: 0 %
HCT: 39.5 % (ref 39.0–52.0)
Hemoglobin: 13.4 g/dL (ref 13.0–17.0)
Immature Granulocytes: 1 %
Lymphocytes Relative: 12 %
Lymphs Abs: 1 K/uL (ref 0.7–4.0)
MCH: 34.4 pg — ABNORMAL HIGH (ref 26.0–34.0)
MCHC: 33.9 g/dL (ref 30.0–36.0)
MCV: 101.3 fL — ABNORMAL HIGH (ref 80.0–100.0)
Monocytes Absolute: 0.5 K/uL (ref 0.1–1.0)
Monocytes Relative: 6 %
Neutro Abs: 6.6 K/uL (ref 1.7–7.7)
Neutrophils Relative %: 81 %
Platelet Count: 65 K/uL — ABNORMAL LOW (ref 150–400)
RBC: 3.9 MIL/uL — ABNORMAL LOW (ref 4.22–5.81)
RDW: 14.2 % (ref 11.5–15.5)
WBC Count: 8.1 K/uL (ref 4.0–10.5)
nRBC: 0 % (ref 0.0–0.2)

## 2024-09-20 LAB — CMP (CANCER CENTER ONLY)
ALT: 21 U/L (ref 0–44)
AST: 24 U/L (ref 15–41)
Albumin: 4.7 g/dL (ref 3.5–5.0)
Alkaline Phosphatase: 67 U/L (ref 38–126)
Anion gap: 15 (ref 5–15)
BUN: 20 mg/dL (ref 8–23)
CO2: 22 mmol/L (ref 22–32)
Calcium: 10 mg/dL (ref 8.9–10.3)
Chloride: 104 mmol/L (ref 98–111)
Creatinine: 1.71 mg/dL — ABNORMAL HIGH (ref 0.61–1.24)
GFR, Estimated: 43 mL/min — ABNORMAL LOW (ref >60)
Glucose, Bld: 128 mg/dL — ABNORMAL HIGH (ref 70–99)
Potassium: 4.5 mmol/L (ref 3.5–5.1)
Sodium: 141 mmol/L (ref 135–145)
Total Bilirubin: 0.5 mg/dL (ref 0.0–1.2)
Total Protein: 7 g/dL (ref 6.5–8.1)

## 2024-09-20 LAB — C DIFFICILE QUICK SCREEN W PCR REFLEX
C Diff antigen: NEGATIVE
C Diff interpretation: NOT DETECTED
C Diff toxin: NEGATIVE

## 2024-09-20 LAB — MAGNESIUM: Magnesium: 1.8 mg/dL (ref 1.7–2.4)

## 2024-09-20 MED ORDER — SODIUM CHLORIDE 0.9 % IV SOLN
INTRAVENOUS | Status: AC
  Filled 2024-09-20 (×2): qty 250

## 2024-09-20 MED ORDER — CIPROFLOXACIN HCL 250 MG PO TABS: 250.0000 mg | ORAL_TABLET | Freq: Two times a day (BID) | ORAL | 0 refills | Status: AC

## 2024-09-20 NOTE — Telephone Encounter (Signed)
 RN returned triage call to patient who reports he has had diarrhea since 9 pm and continues this morning.  Pt also reports vomiting multiple times with last episode at 2 am. Pt states his BP 130/98, HR 105, more short of breath and denies fever. Pt reports this has happen in the past but usually doing better by morning.  Pt is open to [facility] visit.

## 2024-09-20 NOTE — Progress Notes (Signed)
 Symptom Management Clinic [facility] Telephone:([phone]) [phone] Fax:([phone]) [phone]  Patient Care Team: Cleatus Arlyss RAMAN, MD as PCP - General (Family Medicine) Babara Call, MD as Consulting Physician (Oncology) Robinette Curtistine Honey, MD as Referring Physician (Hematology and Oncology) Rojelio Clear, MD as Referring Physician (Hematology and Oncology) Pa, [facility] (Optometry)   NAME OF PATIENT: Luis Burke  [id number]  April 16, 1956   DATE OF VISIT: 09/20/24  REASON FOR CONSULT: Luis Burke is a 68 y.o. male with multiple medical problems including multiple myeloma status post stem cell transplant with early relapse.  Most recently, completed CAR-T at [facility] in April 2025.    INTERVAL HISTORY: Patient saw Dr. Babara last week.  Now presents with nausea, vomiting, and diarrhea.  Patient says that he often will get nausea, vomiting, and diarrhea about once a month and that is generally short-lived, lasting about a day.  Yesterday evening, he says that he had nausea leading to vomiting x 2.  He also started having diarrhea and has had frequent watery stools overnight.  Diarrhea has slowed somewhat this morning.  Patient also endorses shortness of breath that occurred after vomiting.  He says he has felt fatigued and weak.  Denies cough, congestion, or chest pain.  Denies pain, fever, or chills.   Denies chest pain. Denies urinary complaints. Patient offers no further specific complaints today.  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   Fatty liver    on u/s 07/2018   Foot fracture    Hyperlipidemia 03/2004   Hypertension 1994   Multiple myeloma (HCC)    Snores     PAST SURGICAL HISTORY:  Past Surgical History:  Procedure Laterality Date   CATARACT EXTRACTION Left  10/10/2021   CATARACT EXTRACTION W/PHACO Right 03/04/2018   Procedure: CATARACT EXTRACTION PHACO AND INTRAOCULAR LENS PLACEMENT (IOC)  RIGHT TORIC;  Surgeon: Mittie Gaskin, MD;  Location: [facility];  Service: Ophthalmology;  Laterality: Right;  Per [place] no Toric Lens 1:45 5.3   CATARACT EXTRACTION W/PHACO Left 10/10/2021   Procedure: CATARACT EXTRACTION PHACO AND INTRAOCULAR LENS PLACEMENT (IOC) LEFT;  Surgeon: Mittie Gaskin, MD;  Location: [facility];  Service: Ophthalmology;  Laterality: Left;  5.16 1:03.8   COLONOSCOPY WITH PROPOFOL  N/A 07/20/2018   Procedure: COLONOSCOPY WITH PROPOFOL ;  Surgeon: Therisa Bi, MD;  Location: [facility];  Service: Gastroenterology;  Laterality: N/A;   ESOPHAGOGASTRODUODENOSCOPY (EGD) WITH PROPOFOL  N/A 07/20/2018   Procedure: ESOPHAGOGASTRODUODENOSCOPY (EGD) WITH PROPOFOL ;  Surgeon: Therisa Bi, MD;  Location: [facility];  Service: Gastroenterology;  Laterality: N/A;   EYE SURGERY Right    HERNIA REPAIR     IR BONE MARROW BIOPSY & ASPIRATION  05/08/2023   L Lap herniorraphy  04/2000   Left wrist ganglionectomy      HEMATOLOGY/ONCOLOGY HISTORY:  Oncology History  Multiple myeloma (HCC)  08/19/2018 Bone Marrow Biopsy   08/19/2018 bone marrow biopsy showed hypercellular marrow 80%, involved by plasma cell neoplasm up to 95%.  Consistent with plasma cell myeloma. Myeloma FISH  gain of gain of CEP12   08/25/2018 Imaging   PET scan showed no definite hypermetabolic bone disease but CT findings are highly suspicious for numerous small myelomatous lesions involving the spine, sternum and scattered ribs.   08/26/2018 Initial Diagnosis   Multiple myeloma  #  07/27/2018 multiple myeloma panel showed M protein of 0.1, IgG 662, IgA 49, IgM 7. 08/10/2018, free light chain ratio showed extremely high level of kappa free light chain 10,183, with a kappa lambda light chain ratio of 1414.31,LDH 164, Beta-2  microglobulin 5      08/28/2018 - 03/17/2019 Chemotherapy   RVD x 9     04/23/2019 Bone Marrow Transplant   autologous stem cell bone marrow transplant at [facility]  He received preparative regimen with melphalan 200 mg/m on 04/22/2019 followed by autologous stem cell infusion on 04/23/2019. Transplant course was complicated with febrile neutropenia grade 3, treated with vancomycin  and cephapirin until engraftment on 05/06/2019. Patient also had engraftment syndrome and had to be started on Solu-Medrol  25 mg twice daily on 05/05/2019.  Transition to Medrol  Dosepak on 05/07/2019 to complete steroid taper as outpatient.  re-vaccination 12 months post transplant   08/10/2019 - 05/2022 Chemotherapy    Ixazomib 3mg  on D1/D8/D15 out of 28 day cycle       07/12/2020 Imaging   CT skeletal survey done at [facility] 1.  Lucent lesion in the L4 spinous process measuring 4 mm which is  nonspecific. Consider confirmation of marrow replacing process with MRI.  2.  Incompletely characterized renal cysts    07/28/2020 Bone Marrow Biopsy    bone marrow normocellular marrow with polytypic plasmacytosis, 3 to 8%   Patient is in remission.  Cytogenetics negative for trisomy 12.  Negative myeloma FISH panel.   08/05/2020 Imaging   MRI lumbar spine with and without contrast was done at [facility] showed no evidence of spinal metastasis.  Lumbar spondylosis is most pronounced at L5-S1 where there is severe right and moderate left neural foraminal stenosis. 08/11/2020, x-ray cervical spine complete with flexion and extension showed The cervical spine is visualized from C1 to the top of T1 on the lateral  view.  Reversal of the normal cervical lordosis with a focal kyphosis centered at the C5-C6 intervertebral level. Trace anterolisthesis of C4 onto C5. Trace retrolisthesis of C6 on C7. No evidence of dynamic instability is noted on  the flexion or extension views.   No prevertebral soft tissue swelling. Cervical vertebral body heights are maintained.  Multilevel degenerative changes of the cervical spine including discogenic disease, most pronounced at C5-C6 and C6-C7, and facet arthropathy most pronounced at C4-C5. Multilevel mild to moderate left neural foraminal osseous narrowing of the mid to lower cervical vertebrae, possibly centrally/artifactual by patient positioning.     05/15/2021 Bone Marrow Biopsy   Bone marrow biopsy showed slightly hypercellular bone marrow for age with slight plasmacytosis.  5% plasma cells with kappa light chain restriction.  Cytogenetics normal.  Myeloma FISH negative I discussed with [facility] Dr.Choi. we both feel that his bone marrow biopsy result reflects early relapse. Recommend stop Ixazomib. Switch to second line treatment with Daratumumab  Pomlyst Dexamethasone    05/31/2021 - 05/20/2022 Chemotherapy   Patient is on Treatment Plan : MYELOMA Daratumumab  + Pomalidomide  + Dexamethasone  q28d x 7 cycles     05/31/2021 - 12/30/2023 Chemotherapy   Patient is on Treatment Plan : MYELOMA Daratumumab  IV + Pomalidomide  + Dexamethasone  q28d x 7 cycles     09/21/2021 Imaging    PET scan showed no evidence of suspicious or new hypermetabolism, scattered small lucent foci seen previously in the sternum and the spine are less prominent today and have resolved in some regions.  Aortic atherosclerosis.   11/18/2021 Imaging   MRI cervical spine with and without contrast showed no  multiple myeloma lesion or marrow edema identified in the cervical spine.  Abnormal dural thickening and a/or abnormal epidural space at C4/C5 levels.  This is nonspecific.  ER physician discussed the case with neurosurgery Dr. Katrina who feels that this is more likely degenerative disc disease.   11/20/2021 Bone Marrow Biopsy   bone marrow biopsy which showed 1% of plasma cells. The plasma cells generally display polyclonal staining pattern for kappa and lambda light chains although a few very small clusters appear kappa light chain restricted.  The  findings are very limited but most suggestive of minimal residual plasma cell neoplasm especially in the presence of monoclonal protein with kappa light chain specificity. Normal cytogenetics and myeloma FISH   12/20/2021 -  Chemotherapy   Daratumumab  monthly maintenance  # I discussed with patient's [facility]ologist Dr. Rojelio.  We have agreed upon de-escalating his regimen to Daratumumab  monthly maintenance for now. Discontinue pomalyst .     05/08/2023 Bone Marrow Biopsy   Bone marrow biopsy showed  Hypercellular bone marrow with trilineage hematopoiesis and 1% plasma cells  The bone marrow is hypercellular for age with trilineage hematopoiesis including abundant megakaryocytes some of which display atypical morphology.  I suspect that the latter changes are secondary in nature in this setting.  The plasma cells represent 1% of all cells including focally atypical large forms but with lack of large clusters or sheets. The atypical plasma cells display kappa light chain predominance/restriction most suggestive of minimal residual plasma cell neoplasm.  cytogenetics showed Y chromosome loss, negative myeloma FISH   06/2023 - 12/30/2023 Chemotherapy   September 2024: added Revlimid  to Daratumumab  / Dexamethasone , making it Daratumumab  / Revlimid  / Dex regimen. However, Revlimid  was poorly tolerated -> discontinued Revlimid  and he was continued on Daratumumab  maintenance until 12/30/2023   11/21/2023 Procedure   At [facility] he got CAR-T therapy 11/21/2023: T-cell collection for Carvykti CAR-T manufacturing 01/21/2024 - 01/23/2024: LD chemo 01/26/2024: CAR-T infusion   Metastasis to bone (HCC)    ALLERGIES:  is allergic to bactrim  [sulfamethoxazole -trimethoprim], clonidine  derivatives, dapsone, and tadalafil.  MEDICATIONS:  Current Outpatient Medications  Medication Sig Dispense Refill   acetaminophen  (TYLENOL ) 325 MG tablet Take 2 tablets (650 mg total) by mouth See admin instructions. 1 hour prior to  chemotherapy treatments 30 tablet 0   acyclovir  (ZOVIRAX ) 400 MG tablet TAKE 1 TABLET(400 MG) BY MOUTH TWICE DAILY 60 tablet 2   amLODipine  (NORVASC ) 5 MG tablet TAKE 2 TABLETS(10 MG) BY MOUTH DAILY 180 tablet 0   Calcium Magnesium  Zinc 333-133-5 MG TABS Take 1 tablet by mouth daily.     [employer] VITAMIN B12 1000 MCG tablet TAKE 1 TABLET BY MOUTH EVERY DAY 90 tablet 3   lisinopril  (ZESTRIL ) 20 MG tablet Take 1 tablet (20 mg total) by mouth daily.     loratadine  (CLARITIN ) 10 MG tablet Take 10 mg by mouth daily as needed for allergies, rhinitis or itching.     magnesium  oxide (MAG-OX) 400 (240 Mg) MG tablet Take 1 tablet by mouth daily.     metoprolol  succinate (TOPROL -XL) 50 MG 24 hr tablet Take 1 tablet (50 mg total) by mouth in the morning and at bedtime. Take with or immediately following a meal     ondansetron  (ZOFRAN ) 8 MG tablet Take 1 tablet (8 mg total) by mouth every 8 (eight) hours as needed for nausea or vomiting. 30 tablet 1   prochlorperazine  (COMPAZINE ) 5 MG tablet Take 1 tablet (5 mg total) by mouth every 6 (six)  hours as needed for nausea or vomiting.     zinc gluconate 50 MG tablet Take 50 mg by mouth daily.     No current facility-administered medications for this visit.    VITAL SIGNS: BP (!) 112/90   Pulse 92   Temp 98.5 F (36.9 C) (Tympanic)   Resp 18   Ht 5' 7.5 (1.715 m)   Wt 205 lb (93 kg)   SpO2 98%   BMI 31.63 kg/m  Filed Weights   09/20/24 1022  Weight: 205 lb (93 kg)    Estimated body mass index is 31.63 kg/m as calculated from the following:   Height as of this encounter: 5' 7.5 (1.715 m).   Weight as of this encounter: 205 lb (93 kg).  LABS: CBC:    Component Value Date/Time   WBC 8.1 09/20/2024 1000   WBC 4.9 12/30/2023 0816   HGB 13.4 09/20/2024 1000   HCT 39.5 09/20/2024 1000   PLT 65 (L) 09/20/2024 1000   MCV 101.3 (H) 09/20/2024 1000   NEUTROABS 6.6 09/20/2024 1000   LYMPHSABS 1.0 09/20/2024 1000   MONOABS 0.5 09/20/2024 1000   EOSABS  0.0 09/20/2024 1000   BASOSABS 0.0 09/20/2024 1000   Comprehensive Metabolic Panel:    Component Value Date/Time   NA 137 07/01/2024 0817   K 4.1 07/01/2024 0817   CL 106 07/01/2024 0817   CO2 23 07/01/2024 0817   BUN 24 (H) 07/01/2024 0817   CREATININE 1.45 (H) 07/01/2024 0817   GLUCOSE 115 (H) 07/01/2024 0817   CALCIUM 9.5 07/01/2024 0817   AST 20 07/01/2024 0817   ALT 19 07/01/2024 0817   ALKPHOS 63 07/01/2024 0817   BILITOT 1.0 07/01/2024 0817   PROT 6.6 07/01/2024 0817   ALBUMIN 4.7 07/01/2024 0817    RADIOGRAPHIC STUDIES: No results found.   PERFORMANCE STATUS (ECOG) : 1 - Symptomatic but completely ambulatory  Review of Systems Unless otherwise noted, a complete review of systems is negative.  Physical Exam General: NAD, nontoxic appearing Cardiovascular: regular rate and rhythm Pulmonary: clear anterior/posterior fields Abdomen: soft, nontender, + bowel sounds GU: no suprapubic tenderness Extremities: no edema, no joint deformities Skin: no rashes Neurological: nonfocal  IMPRESSION/PLAN: Multiple myeloma -s/p CAR-T  Nausea/vomiting/diarrhea -patient high risk for infection.  Will check stool studies/C. difficile PCR.  Discussed with Dr. Babara and will start patient empirically on renally dosed Cipro  250 mg twice daily x 5 days  Shortness of breath -patient has nonlabored breathing.  No cough appreciated.  Lungs clear to auscultation.  Again, patient has higher risk for respiratory infection after CAR-T.  He is not on pneumonia prophylaxis as he was unable to GI upset.  Will send for x-ray.  AKI -serum creatinine 1.71 today, up from baseline 1.2-1.5.  Will proceed with IV fluids.  Case and plan discussed with Dr. Babara.   Patient expressed understanding and was in agreement with this plan. He also understands that He can call clinic at any time with any questions, concerns, or complaints.   Thank you for allowing me to participate in the care of this very  pleasant patient.   Time Total: 25 minutes  Visit consisted of counseling and education dealing with the complex and emotionally intense issues of symptom management in the setting of serious illness.Greater than 50%  of this time was spent counseling and coordinating care related to the above assessment and plan.  Signed by: Fonda Mower, PhD, NP-C

## 2024-09-20 NOTE — Telephone Encounter (Signed)
 Spoke w/patient. He can come in the next 15 mins for labs/ smc/ poss. Iv fluids.

## 2024-09-21 ENCOUNTER — Telehealth: Payer: Self-pay | Admitting: *Deleted

## 2024-09-21 ENCOUNTER — Inpatient Hospital Stay

## 2024-09-21 ENCOUNTER — Encounter: Payer: Self-pay | Admitting: Oncology

## 2024-09-21 NOTE — Telephone Encounter (Signed)
Called and spoke with patient see phone note.

## 2024-09-21 NOTE — Telephone Encounter (Signed)
 Left vm for patient- requesting a return phone call to go over test results and follow-up symptoms mgmt concerns from yesterday.

## 2024-09-21 NOTE — Telephone Encounter (Signed)
 Spoke with patient. Pt informed of test results stool GI panels, negative as well as cdiff negative. Chest xray wnl. Pt instructed to keep taking the cipro  as directed. Per patient, he has not yet started on cipro , but planned to pick it up today.  He has an apt on Friday this week for repeat labs and possible iv fluids. Pt instructed to keep this apt. He stated that he was feeling some better today. He thanked me for calling him back.

## 2024-09-22 ENCOUNTER — Inpatient Hospital Stay

## 2024-09-24 ENCOUNTER — Inpatient Hospital Stay

## 2024-09-24 VITALS — BP 144/91 | HR 66 | Resp 18

## 2024-09-24 DIAGNOSIS — C9002 Multiple myeloma in relapse: Secondary | ICD-10-CM | POA: Diagnosis not present

## 2024-09-24 DIAGNOSIS — C9001 Multiple myeloma in remission: Secondary | ICD-10-CM

## 2024-09-24 DIAGNOSIS — E860 Dehydration: Secondary | ICD-10-CM

## 2024-09-24 LAB — BASIC METABOLIC PANEL - CANCER CENTER ONLY
Anion gap: 10 (ref 5–15)
BUN: 17 mg/dL (ref 8–23)
CO2: 26 mmol/L (ref 22–32)
Calcium: 9.7 mg/dL (ref 8.9–10.3)
Chloride: 105 mmol/L (ref 98–111)
Creatinine: 1.42 mg/dL — ABNORMAL HIGH (ref 0.61–1.24)
GFR, Estimated: 54 mL/min — ABNORMAL LOW (ref >60)
Glucose, Bld: 105 mg/dL — ABNORMAL HIGH (ref 70–99)
Potassium: 4.3 mmol/L (ref 3.5–5.1)
Sodium: 142 mmol/L (ref 135–145)

## 2024-09-24 LAB — CBC WITH DIFFERENTIAL (CANCER CENTER ONLY)
Abs Immature Granulocytes: 0.01 K/uL (ref 0.00–0.07)
Basophils Absolute: 0 K/uL (ref 0.0–0.1)
Basophils Relative: 0 %
Eosinophils Absolute: 0.1 K/uL (ref 0.0–0.5)
Eosinophils Relative: 1 %
HCT: 35.3 % — ABNORMAL LOW (ref 39.0–52.0)
Hemoglobin: 12.3 g/dL — ABNORMAL LOW (ref 13.0–17.0)
Immature Granulocytes: 0 %
Lymphocytes Relative: 32 %
Lymphs Abs: 1.1 K/uL (ref 0.7–4.0)
MCH: 34.6 pg — ABNORMAL HIGH (ref 26.0–34.0)
MCHC: 34.8 g/dL (ref 30.0–36.0)
MCV: 99.2 fL (ref 80.0–100.0)
Monocytes Absolute: 0.4 K/uL (ref 0.1–1.0)
Monocytes Relative: 11 %
Neutro Abs: 1.9 K/uL (ref 1.7–7.7)
Neutrophils Relative %: 56 %
Platelet Count: 62 K/uL — ABNORMAL LOW (ref 150–400)
RBC: 3.56 MIL/uL — ABNORMAL LOW (ref 4.22–5.81)
RDW: 13.5 % (ref 11.5–15.5)
WBC Count: 3.5 K/uL — ABNORMAL LOW (ref 4.0–10.5)
nRBC: 0 % (ref 0.0–0.2)

## 2024-09-24 MED ORDER — SODIUM CHLORIDE 0.9 % IV SOLN
INTRAVENOUS | Status: DC
  Filled 2024-09-24 (×2): qty 250

## 2024-09-27 ENCOUNTER — Telehealth: Payer: Self-pay | Admitting: *Deleted

## 2024-09-27 NOTE — Telephone Encounter (Signed)
 Dr. Babara. Dr. Samule Lunger is requesting a call to her cell # [phone]- ([facility]).  patient needs a moh's surgery. Dr. Lunger is concerned about his plt count and wants to know if ok to proceed with Moh's.

## 2024-09-29 ENCOUNTER — Inpatient Hospital Stay

## 2024-09-29 DIAGNOSIS — C9002 Multiple myeloma in relapse: Secondary | ICD-10-CM | POA: Diagnosis not present

## 2024-09-29 DIAGNOSIS — C9001 Multiple myeloma in remission: Secondary | ICD-10-CM

## 2024-09-29 LAB — CBC WITH DIFFERENTIAL (CANCER CENTER ONLY)
Abs Immature Granulocytes: 0.02 K/uL (ref 0.00–0.07)
Basophils Absolute: 0 K/uL (ref 0.0–0.1)
Basophils Relative: 0 %
Eosinophils Absolute: 0 K/uL (ref 0.0–0.5)
Eosinophils Relative: 1 %
HCT: 35 % — ABNORMAL LOW (ref 39.0–52.0)
Hemoglobin: 12 g/dL — ABNORMAL LOW (ref 13.0–17.0)
Immature Granulocytes: 1 %
Lymphocytes Relative: 32 %
Lymphs Abs: 1.3 K/uL (ref 0.7–4.0)
MCH: 34.4 pg — ABNORMAL HIGH (ref 26.0–34.0)
MCHC: 34.3 g/dL (ref 30.0–36.0)
MCV: 100.3 fL — ABNORMAL HIGH (ref 80.0–100.0)
Monocytes Absolute: 0.5 K/uL (ref 0.1–1.0)
Monocytes Relative: 11 %
Neutro Abs: 2.2 K/uL (ref 1.7–7.7)
Neutrophils Relative %: 55 %
Platelet Count: 51 K/uL — ABNORMAL LOW (ref 150–400)
RBC: 3.49 MIL/uL — ABNORMAL LOW (ref 4.22–5.81)
RDW: 13.8 % (ref 11.5–15.5)
WBC Count: 4.1 K/uL (ref 4.0–10.5)
nRBC: 0 % (ref 0.0–0.2)

## 2024-09-29 NOTE — Progress Notes (Signed)
 Platelets 51,000 today.  Confirmed with MD to HOLD Nplate  today.

## 2024-10-04 ENCOUNTER — Other Ambulatory Visit: Payer: Self-pay | Admitting: Family Medicine

## 2024-10-04 MED ORDER — METOPROLOL SUCCINATE ER 50 MG PO TB24: 50.0000 mg | ORAL_TABLET | Freq: Two times a day (BID) | ORAL | 1 refills | Status: AC

## 2024-10-04 NOTE — Telephone Encounter (Signed)
 Medication:  metoprolol  succinate (TOPROL -XL) 50 MG 24 hr tablet  It needs to read so insurance will pay:  Sig: Take 1 tablet (100 mg total daily) by mouth in the morning and at bedtime. Take with or immediately following a meal

## 2024-10-04 NOTE — Telephone Encounter (Signed)
 Copied from CRM #[id number]. Topic: Clinical - Medication Refill >> Oct 04, 2024  8:28 AM Cynthia K wrote: Medication:  metoprolol  succinate (TOPROL -XL) 50 MG 24 hr tablet  It needs to read so insurance will pay:  Sig: Take 1 tablet (100 mg total daily) by mouth in the morning and at bedtime. Take with or immediately following a meal  Has the patient contacted their pharmacy? Yes (Agent: If no, request that the patient contact the pharmacy for the refill. If patient does not wish to contact the pharmacy document the reason why and proceed with request.) (Agent: If yes, when and what did the pharmacy advise?) Pharmacy needs order to refill  This is the patient's preferred pharmacy:  [id number] [place], [place] - [address] AT [facility] OF [address] [place] [address] Phone: [phone] Fax: [phone]  Is this the correct pharmacy for this prescription? Yes If no, delete pharmacy and type the correct one.   Has the prescription been filled recently? No  Is the patient out of the medication? Yes - He ran out yesterday   Has the patient been seen for an appointment in the last year OR does the patient have an upcoming appointment? Yes  Can we respond through MyChart? Yes  Agent: Please be advised that Rx refills may take up to 3 business days. We ask that you follow-up with your pharmacy.

## 2024-10-06 ENCOUNTER — Inpatient Hospital Stay

## 2024-10-06 DIAGNOSIS — C9001 Multiple myeloma in remission: Secondary | ICD-10-CM

## 2024-10-06 DIAGNOSIS — C9002 Multiple myeloma in relapse: Secondary | ICD-10-CM | POA: Diagnosis not present

## 2024-10-06 LAB — CBC WITH DIFFERENTIAL (CANCER CENTER ONLY)
Abs Immature Granulocytes: 0.01 K/uL (ref 0.00–0.07)
Basophils Absolute: 0 K/uL (ref 0.0–0.1)
Basophils Relative: 0 %
Eosinophils Absolute: 0.1 K/uL (ref 0.0–0.5)
Eosinophils Relative: 2 %
HCT: 34.6 % — ABNORMAL LOW (ref 39.0–52.0)
Hemoglobin: 11.8 g/dL — ABNORMAL LOW (ref 13.0–17.0)
Immature Granulocytes: 0 %
Lymphocytes Relative: 33 %
Lymphs Abs: 0.9 K/uL (ref 0.7–4.0)
MCH: 33.8 pg (ref 26.0–34.0)
MCHC: 34.1 g/dL (ref 30.0–36.0)
MCV: 99.1 fL (ref 80.0–100.0)
Monocytes Absolute: 0.3 K/uL (ref 0.1–1.0)
Monocytes Relative: 12 %
Neutro Abs: 1.3 K/uL — ABNORMAL LOW (ref 1.7–7.7)
Neutrophils Relative %: 53 %
Platelet Count: 54 K/uL — ABNORMAL LOW (ref 150–400)
RBC: 3.49 MIL/uL — ABNORMAL LOW (ref 4.22–5.81)
RDW: 13.8 % (ref 11.5–15.5)
WBC Count: 2.6 K/uL — ABNORMAL LOW (ref 4.0–10.5)
nRBC: 0 % (ref 0.0–0.2)

## 2024-10-06 NOTE — Progress Notes (Signed)
 Platelets 54,000 today, no Nplate  inj given

## 2024-10-15 ENCOUNTER — Inpatient Hospital Stay

## 2024-10-15 ENCOUNTER — Inpatient Hospital Stay: Attending: Oncology

## 2024-10-15 ENCOUNTER — Inpatient Hospital Stay (HOSPITAL_BASED_OUTPATIENT_CLINIC_OR_DEPARTMENT_OTHER): Admitting: Nurse Practitioner

## 2024-10-15 ENCOUNTER — Inpatient Hospital Stay: Admitting: Oncology

## 2024-10-15 VITALS — BP 144/85 | HR 64 | Temp 97.0°F | Resp 18 | Ht 67.5 in | Wt 205.0 lb

## 2024-10-15 DIAGNOSIS — C9001 Multiple myeloma in remission: Secondary | ICD-10-CM | POA: Diagnosis not present

## 2024-10-15 DIAGNOSIS — C9002 Multiple myeloma in relapse: Secondary | ICD-10-CM | POA: Diagnosis present

## 2024-10-15 DIAGNOSIS — D696 Thrombocytopenia, unspecified: Secondary | ICD-10-CM | POA: Insufficient documentation

## 2024-10-15 DIAGNOSIS — Z9484 Stem cells transplant status: Secondary | ICD-10-CM | POA: Diagnosis not present

## 2024-10-15 DIAGNOSIS — N1831 Chronic kidney disease, stage 3a: Secondary | ICD-10-CM | POA: Diagnosis not present

## 2024-10-15 DIAGNOSIS — Z9481 Bone marrow transplant status: Secondary | ICD-10-CM | POA: Diagnosis not present

## 2024-10-15 DIAGNOSIS — Z8042 Family history of malignant neoplasm of prostate: Secondary | ICD-10-CM | POA: Insufficient documentation

## 2024-10-15 DIAGNOSIS — Z800 Family history of malignant neoplasm of digestive organs: Secondary | ICD-10-CM | POA: Diagnosis not present

## 2024-10-15 LAB — CMP (CANCER CENTER ONLY)
ALT: 24 U/L (ref 0–44)
AST: 29 U/L (ref 15–41)
Albumin: 4.9 g/dL (ref 3.5–5.0)
Alkaline Phosphatase: 74 U/L (ref 38–126)
Anion gap: 13 (ref 5–15)
BUN: 20 mg/dL (ref 8–23)
CO2: 26 mmol/L (ref 22–32)
Calcium: 10.5 mg/dL — ABNORMAL HIGH (ref 8.9–10.3)
Chloride: 102 mmol/L (ref 98–111)
Creatinine: 1.4 mg/dL — ABNORMAL HIGH (ref 0.61–1.24)
GFR, Estimated: 55 mL/min — ABNORMAL LOW
Glucose, Bld: 109 mg/dL — ABNORMAL HIGH (ref 70–99)
Potassium: 4.5 mmol/L (ref 3.5–5.1)
Sodium: 140 mmol/L (ref 135–145)
Total Bilirubin: 0.7 mg/dL (ref 0.0–1.2)
Total Protein: 7 g/dL (ref 6.5–8.1)

## 2024-10-15 LAB — CBC WITH DIFFERENTIAL (CANCER CENTER ONLY)
Abs Immature Granulocytes: 0.01 K/uL (ref 0.00–0.07)
Basophils Absolute: 0 K/uL (ref 0.0–0.1)
Basophils Relative: 0 %
Eosinophils Absolute: 0 K/uL (ref 0.0–0.5)
Eosinophils Relative: 1 %
HCT: 37 % — ABNORMAL LOW (ref 39.0–52.0)
Hemoglobin: 12.9 g/dL — ABNORMAL LOW (ref 13.0–17.0)
Immature Granulocytes: 0 %
Lymphocytes Relative: 33 %
Lymphs Abs: 1.3 K/uL (ref 0.7–4.0)
MCH: 34.5 pg — ABNORMAL HIGH (ref 26.0–34.0)
MCHC: 34.9 g/dL (ref 30.0–36.0)
MCV: 98.9 fL (ref 80.0–100.0)
Monocytes Absolute: 0.4 K/uL (ref 0.1–1.0)
Monocytes Relative: 10 %
Neutro Abs: 2.2 K/uL (ref 1.7–7.7)
Neutrophils Relative %: 56 %
Platelet Count: 59 K/uL — ABNORMAL LOW (ref 150–400)
RBC: 3.74 MIL/uL — ABNORMAL LOW (ref 4.22–5.81)
RDW: 14.2 % (ref 11.5–15.5)
WBC Count: 4 K/uL (ref 4.0–10.5)
nRBC: 0 % (ref 0.0–0.2)
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
## 2024-10-15 NOTE — Progress Notes (Signed)
 Platelets 59,000 today, no inj given.

## 2024-10-18 LAB — KAPPA/LAMBDA LIGHT CHAINS
Kappa free light chain: 6.6 mg/L (ref 3.3–19.4)
Kappa, lambda light chain ratio: 1.08 (ref 0.26–1.65)
Lambda free light chains: 6.1 mg/L (ref 5.7–26.3)

## 2024-10-19 LAB — MULTIPLE MYELOMA PANEL, SERUM
Albumin SerPl Elph-Mcnc: 3.8 g/dL (ref 2.9–4.4)
Albumin/Glob SerPl: 1.5 (ref 0.7–1.7)
Alpha 1: 0.3 g/dL (ref 0.0–0.4)
Alpha2 Glob SerPl Elph-Mcnc: 0.7 g/dL (ref 0.4–1.0)
B-Globulin SerPl Elph-Mcnc: 1 g/dL (ref 0.7–1.3)
Gamma Glob SerPl Elph-Mcnc: 0.7 g/dL (ref 0.4–1.8)
Globulin, Total: 2.7 g/dL (ref 2.2–3.9)
IgA: <5 mg/dL — ABNORMAL LOW (ref 61–437)
IgG (Immunoglobin G), Serum: 629 mg/dL (ref 603–1613)
IgM (Immunoglobulin M), Srm: 26 mg/dL (ref 20–172)
Total Protein ELP: 6.5 g/dL (ref 6.0–8.5)

## 2024-10-21 ENCOUNTER — Other Ambulatory Visit: Payer: Self-pay | Admitting: Family Medicine

## 2024-10-21 NOTE — Telephone Encounter (Signed)
 Copied from CRM #[id number]. Topic: Clinical - Medication Refill >> Oct 21, 2024 10:30 AM Shereese L wrote: Medication: lisinopril  (ZESTRIL ) 20 MG tablet  Has the patient contacted their pharmacy? Yes (Agent: If no, request that the patient contact the pharmacy for the refill. If patient does not wish to contact the pharmacy document the reason why and proceed with request.) (Agent: If yes, when and what did the pharmacy advise?)  This is the patient's preferred pharmacy:  [id number] [place], [place] - [address] AT [facility] OF [address] [place] [address] Phone: [phone] Fax: [phone]    Is this the correct pharmacy for this prescription? Yes If no, delete pharmacy and type the correct one.   Has the prescription been filled recently? Yes  Is the patient out of the medication? Yes  Has the patient been seen for an appointment in the last year OR does the patient have an upcoming appointment? Yes  Can we respond through MyChart? Yes  Agent: Please be advised that Rx refills may take up to 3 business days. We ask that you follow-up with your pharmacy.

## 2024-10-22 ENCOUNTER — Inpatient Hospital Stay

## 2024-10-22 DIAGNOSIS — C9001 Multiple myeloma in remission: Secondary | ICD-10-CM

## 2024-10-22 DIAGNOSIS — D696 Thrombocytopenia, unspecified: Secondary | ICD-10-CM

## 2024-10-22 DIAGNOSIS — C9002 Multiple myeloma in relapse: Secondary | ICD-10-CM | POA: Diagnosis not present

## 2024-10-22 LAB — CBC WITH DIFFERENTIAL (CANCER CENTER ONLY)
Abs Immature Granulocytes: 0.01 K/uL (ref 0.00–0.07)
Basophils Absolute: 0 K/uL (ref 0.0–0.1)
Basophils Relative: 0 %
Eosinophils Absolute: 0.1 K/uL (ref 0.0–0.5)
Eosinophils Relative: 1 %
HCT: 35.8 % — ABNORMAL LOW (ref 39.0–52.0)
Hemoglobin: 12.6 g/dL — ABNORMAL LOW (ref 13.0–17.0)
Immature Granulocytes: 0 %
Lymphocytes Relative: 33 %
Lymphs Abs: 1.4 K/uL (ref 0.7–4.0)
MCH: 34.7 pg — ABNORMAL HIGH (ref 26.0–34.0)
MCHC: 35.2 g/dL (ref 30.0–36.0)
MCV: 98.6 fL (ref 80.0–100.0)
Monocytes Absolute: 0.4 K/uL (ref 0.1–1.0)
Monocytes Relative: 10 %
Neutro Abs: 2.4 K/uL (ref 1.7–7.7)
Neutrophils Relative %: 56 %
Platelet Count: 54 K/uL — ABNORMAL LOW (ref 150–400)
RBC: 3.63 MIL/uL — ABNORMAL LOW (ref 4.22–5.81)
RDW: 14.1 % (ref 11.5–15.5)
WBC Count: 4.3 K/uL (ref 4.0–10.5)
nRBC: 0 % (ref 0.0–0.2)

## 2024-10-22 MED ORDER — LISINOPRIL 20 MG PO TABS: 20.0000 mg | ORAL_TABLET | Freq: Every day | ORAL | 1 refills | Status: AC

## 2024-10-22 NOTE — Progress Notes (Signed)
 Platelets 54 today, no Nplate  given.

## 2024-10-29 ENCOUNTER — Inpatient Hospital Stay

## 2024-10-29 ENCOUNTER — Telehealth: Payer: Self-pay | Admitting: Oncology

## 2024-10-29 ENCOUNTER — Telehealth: Payer: Self-pay

## 2024-10-29 DIAGNOSIS — C9002 Multiple myeloma in relapse: Secondary | ICD-10-CM | POA: Diagnosis not present

## 2024-10-29 DIAGNOSIS — C9001 Multiple myeloma in remission: Secondary | ICD-10-CM

## 2024-10-29 DIAGNOSIS — D696 Thrombocytopenia, unspecified: Secondary | ICD-10-CM

## 2024-10-29 LAB — CBC WITH DIFFERENTIAL (CANCER CENTER ONLY)
Abs Immature Granulocytes: 0.01 K/uL (ref 0.00–0.07)
Basophils Absolute: 0 K/uL (ref 0.0–0.1)
Basophils Relative: 0 %
Eosinophils Absolute: 0 K/uL (ref 0.0–0.5)
Eosinophils Relative: 1 %
HCT: 36.8 % — ABNORMAL LOW (ref 39.0–52.0)
Hemoglobin: 12.9 g/dL — ABNORMAL LOW (ref 13.0–17.0)
Immature Granulocytes: 0 %
Lymphocytes Relative: 31 %
Lymphs Abs: 1.5 K/uL (ref 0.7–4.0)
MCH: 34.6 pg — ABNORMAL HIGH (ref 26.0–34.0)
MCHC: 35.1 g/dL (ref 30.0–36.0)
MCV: 98.7 fL (ref 80.0–100.0)
Monocytes Absolute: 0.5 K/uL (ref 0.1–1.0)
Monocytes Relative: 10 %
Neutro Abs: 2.8 K/uL (ref 1.7–7.7)
Neutrophils Relative %: 58 %
Platelet Count: 52 K/uL — ABNORMAL LOW (ref 150–400)
RBC: 3.73 MIL/uL — ABNORMAL LOW (ref 4.22–5.81)
RDW: 14.1 % (ref 11.5–15.5)
WBC Count: 4.8 K/uL (ref 4.0–10.5)
nRBC: 0 % (ref 0.0–0.2)

## 2024-10-29 NOTE — Telephone Encounter (Signed)
 Copied from CRM [id number]. Topic: Clinical - Medication Question >> Oct 29, 2024  1:01 PM Alfonso HERO wrote: Reason for CRM: patient confused on Rx. One says take half the other say take a whole tab. This is for his lisinopril  (ZESTRIL ) 20 MG tablet. Patient is asking if anything has change and would like a callback to clarify.

## 2024-10-29 NOTE — Progress Notes (Signed)
 Platelet 52,000 today, no Nplate  given.

## 2024-10-29 NOTE — Telephone Encounter (Signed)
 Pt came to my desk today and states that moving forward he would like for his appts to be scheduled between 8 am and 10 am.

## 2024-10-29 NOTE — Telephone Encounter (Signed)
 Please verify BP and how much lisinopril  he has been taking.  I thought he was taking 1 of the 20mg  tabs.  Thanks.

## 2024-10-29 NOTE — Telephone Encounter (Signed)
 Pt states that the pharmacy gave him 2 Rx. One says take half the other say take a whole tab. Informed pt he is suppose to be taking 1 20mg  once daily. Pt repeated the message and understood. His BP was 160/90.

## 2024-10-31 NOTE — Telephone Encounter (Signed)
 Noted.  I would continue with 20 mg lisinopril  per day.  Have him check his blood pressure a few more times at home when he has been taking 20 mg of lisinopril  per day.  If his blood pressure is persistently elevated above 140/90 with 20 mg of lisinopril  per day, then he could increase to 30 mg (1.5 of the 20 mg pills).  Then he would need to follow-up here in clinic for an office visit to recheck blood pressure and labs about 1 to 2 weeks after the change.  Thanks.

## 2024-11-01 NOTE — Telephone Encounter (Signed)
 Relayed message to pt. And pt  understood.

## 2024-11-05 ENCOUNTER — Inpatient Hospital Stay

## 2024-11-05 DIAGNOSIS — D696 Thrombocytopenia, unspecified: Secondary | ICD-10-CM

## 2024-11-05 DIAGNOSIS — C9002 Multiple myeloma in relapse: Secondary | ICD-10-CM | POA: Diagnosis not present

## 2024-11-05 DIAGNOSIS — C9001 Multiple myeloma in remission: Secondary | ICD-10-CM

## 2024-11-05 LAB — CBC WITH DIFFERENTIAL (CANCER CENTER ONLY)
Abs Immature Granulocytes: 0.01 K/uL (ref 0.00–0.07)
Basophils Absolute: 0 K/uL (ref 0.0–0.1)
Basophils Relative: 0 %
Eosinophils Absolute: 0.1 K/uL (ref 0.0–0.5)
Eosinophils Relative: 2 %
HCT: 35.6 % — ABNORMAL LOW (ref 39.0–52.0)
Hemoglobin: 12.6 g/dL — ABNORMAL LOW (ref 13.0–17.0)
Immature Granulocytes: 0 %
Lymphocytes Relative: 39 %
Lymphs Abs: 1.5 K/uL (ref 0.7–4.0)
MCH: 34.7 pg — ABNORMAL HIGH (ref 26.0–34.0)
MCHC: 35.4 g/dL (ref 30.0–36.0)
MCV: 98.1 fL (ref 80.0–100.0)
Monocytes Absolute: 0.4 K/uL (ref 0.1–1.0)
Monocytes Relative: 10 %
Neutro Abs: 1.9 K/uL (ref 1.7–7.7)
Neutrophils Relative %: 49 %
Platelet Count: 58 K/uL — ABNORMAL LOW (ref 150–400)
RBC: 3.63 MIL/uL — ABNORMAL LOW (ref 4.22–5.81)
RDW: 14 % (ref 11.5–15.5)
WBC Count: 4 K/uL (ref 4.0–10.5)
nRBC: 0 % (ref 0.0–0.2)

## 2024-11-05 NOTE — Progress Notes (Signed)
 PLT 58 Per MD no injection today, patient verbalizes understanding

## 2024-11-12 ENCOUNTER — Inpatient Hospital Stay

## 2024-11-12 ENCOUNTER — Other Ambulatory Visit: Payer: Self-pay | Admitting: Oncology

## 2024-11-12 DIAGNOSIS — C9002 Multiple myeloma in relapse: Secondary | ICD-10-CM | POA: Diagnosis not present

## 2024-11-12 DIAGNOSIS — C9001 Multiple myeloma in remission: Secondary | ICD-10-CM

## 2024-11-12 DIAGNOSIS — D696 Thrombocytopenia, unspecified: Secondary | ICD-10-CM

## 2024-11-12 LAB — CBC WITH DIFFERENTIAL (CANCER CENTER ONLY)
Abs Immature Granulocytes: 0.01 10*3/uL (ref 0.00–0.07)
Basophils Absolute: 0 10*3/uL (ref 0.0–0.1)
Basophils Relative: 0 %
Eosinophils Absolute: 0.1 10*3/uL (ref 0.0–0.5)
Eosinophils Relative: 1 %
HCT: 35.5 % — ABNORMAL LOW (ref 39.0–52.0)
Hemoglobin: 12.3 g/dL — ABNORMAL LOW (ref 13.0–17.0)
Immature Granulocytes: 0 %
Lymphocytes Relative: 32 %
Lymphs Abs: 1.2 10*3/uL (ref 0.7–4.0)
MCH: 34.7 pg — ABNORMAL HIGH (ref 26.0–34.0)
MCHC: 34.6 g/dL (ref 30.0–36.0)
MCV: 100.3 fL — ABNORMAL HIGH (ref 80.0–100.0)
Monocytes Absolute: 0.4 10*3/uL (ref 0.1–1.0)
Monocytes Relative: 10 %
Neutro Abs: 2.2 10*3/uL (ref 1.7–7.7)
Neutrophils Relative %: 57 %
Platelet Count: 50 10*3/uL — ABNORMAL LOW (ref 150–400)
RBC: 3.54 MIL/uL — ABNORMAL LOW (ref 4.22–5.81)
RDW: 14.2 % (ref 11.5–15.5)
WBC Count: 3.8 10*3/uL — ABNORMAL LOW (ref 4.0–10.5)
nRBC: 0 % (ref 0.0–0.2)

## 2024-11-12 NOTE — Progress Notes (Signed)
 Injection not needed per Dr. Layvonne parameters of hold greater than or equal to 50.

## 2024-11-19 ENCOUNTER — Inpatient Hospital Stay

## 2024-11-29 ENCOUNTER — Inpatient Hospital Stay

## 2024-11-29 ENCOUNTER — Inpatient Hospital Stay: Admitting: Oncology

## 2025-08-02 ENCOUNTER — Other Ambulatory Visit

## 2025-08-05 ENCOUNTER — Ambulatory Visit

## 2025-08-09 ENCOUNTER — Ambulatory Visit: Admitting: Family Medicine

## 2025-08-09 ENCOUNTER — Ambulatory Visit
